# Patient Record
Sex: Female | Born: 1955 | Race: Black or African American | Hispanic: No | Smoking: Former smoker
Health system: Southern US, Community
[De-identification: ages and names within clinical notes are randomized; demographics above are authoritative.]

## PROBLEM LIST (undated history)

## (undated) ENCOUNTER — Inpatient Hospital Stay: Admission: EM | Payer: Self-pay | Source: Home / Self Care

## (undated) DIAGNOSIS — G629 Polyneuropathy, unspecified: Secondary | ICD-10-CM

## (undated) DIAGNOSIS — K922 Gastrointestinal hemorrhage, unspecified: Secondary | ICD-10-CM

## (undated) DIAGNOSIS — T7840XA Allergy, unspecified, initial encounter: Secondary | ICD-10-CM

## (undated) DIAGNOSIS — E669 Obesity, unspecified: Secondary | ICD-10-CM

## (undated) DIAGNOSIS — E119 Type 2 diabetes mellitus without complications: Secondary | ICD-10-CM

## (undated) DIAGNOSIS — I639 Cerebral infarction, unspecified: Secondary | ICD-10-CM

## (undated) DIAGNOSIS — M810 Age-related osteoporosis without current pathological fracture: Secondary | ICD-10-CM

## (undated) DIAGNOSIS — K589 Irritable bowel syndrome without diarrhea: Secondary | ICD-10-CM

## (undated) DIAGNOSIS — T8859XA Other complications of anesthesia, initial encounter: Secondary | ICD-10-CM

## (undated) DIAGNOSIS — R531 Weakness: Secondary | ICD-10-CM

## (undated) DIAGNOSIS — M1990 Unspecified osteoarthritis, unspecified site: Secondary | ICD-10-CM

## (undated) DIAGNOSIS — I739 Peripheral vascular disease, unspecified: Secondary | ICD-10-CM

## (undated) DIAGNOSIS — E538 Deficiency of other specified B group vitamins: Secondary | ICD-10-CM

## (undated) DIAGNOSIS — I70229 Atherosclerosis of native arteries of extremities with rest pain, unspecified extremity: Secondary | ICD-10-CM

## (undated) DIAGNOSIS — J349 Unspecified disorder of nose and nasal sinuses: Secondary | ICD-10-CM

## (undated) DIAGNOSIS — F419 Anxiety disorder, unspecified: Secondary | ICD-10-CM

## (undated) DIAGNOSIS — Z9289 Personal history of other medical treatment: Secondary | ICD-10-CM

## (undated) DIAGNOSIS — E785 Hyperlipidemia, unspecified: Secondary | ICD-10-CM

## (undated) DIAGNOSIS — K222 Esophageal obstruction: Secondary | ICD-10-CM

## (undated) DIAGNOSIS — T4145XA Adverse effect of unspecified anesthetic, initial encounter: Secondary | ICD-10-CM

## (undated) DIAGNOSIS — Z959 Presence of cardiac and vascular implant and graft, unspecified: Secondary | ICD-10-CM

## (undated) DIAGNOSIS — L732 Hidradenitis suppurativa: Secondary | ICD-10-CM

## (undated) DIAGNOSIS — J45909 Unspecified asthma, uncomplicated: Secondary | ICD-10-CM

## (undated) DIAGNOSIS — I998 Other disorder of circulatory system: Secondary | ICD-10-CM

## (undated) DIAGNOSIS — I219 Acute myocardial infarction, unspecified: Secondary | ICD-10-CM

## (undated) DIAGNOSIS — K219 Gastro-esophageal reflux disease without esophagitis: Secondary | ICD-10-CM

## (undated) DIAGNOSIS — D649 Anemia, unspecified: Secondary | ICD-10-CM

## (undated) DIAGNOSIS — T783XXA Angioneurotic edema, initial encounter: Secondary | ICD-10-CM

## (undated) DIAGNOSIS — Z8719 Personal history of other diseases of the digestive system: Secondary | ICD-10-CM

## (undated) DIAGNOSIS — K5731 Diverticulosis of large intestine without perforation or abscess with bleeding: Secondary | ICD-10-CM

## (undated) DIAGNOSIS — I2510 Atherosclerotic heart disease of native coronary artery without angina pectoris: Secondary | ICD-10-CM

## (undated) DIAGNOSIS — H269 Unspecified cataract: Secondary | ICD-10-CM

## (undated) DIAGNOSIS — C801 Malignant (primary) neoplasm, unspecified: Secondary | ICD-10-CM

## (undated) DIAGNOSIS — K259 Gastric ulcer, unspecified as acute or chronic, without hemorrhage or perforation: Secondary | ICD-10-CM

## (undated) DIAGNOSIS — J189 Pneumonia, unspecified organism: Secondary | ICD-10-CM

## (undated) DIAGNOSIS — J069 Acute upper respiratory infection, unspecified: Secondary | ICD-10-CM

## (undated) DIAGNOSIS — G43909 Migraine, unspecified, not intractable, without status migrainosus: Secondary | ICD-10-CM

## (undated) DIAGNOSIS — L500 Allergic urticaria: Secondary | ICD-10-CM

## (undated) DIAGNOSIS — I10 Essential (primary) hypertension: Secondary | ICD-10-CM

## (undated) HISTORY — DX: Gastrointestinal hemorrhage, unspecified: K92.2

## (undated) HISTORY — DX: Other disorder of circulatory system: I99.8

## (undated) HISTORY — DX: Unspecified cataract: H26.9

## (undated) HISTORY — DX: Age-related osteoporosis without current pathological fracture: M81.0

## (undated) HISTORY — DX: Allergy, unspecified, initial encounter: T78.40XA

## (undated) HISTORY — DX: Acute upper respiratory infection, unspecified: J06.9

## (undated) HISTORY — DX: Gastric ulcer, unspecified as acute or chronic, without hemorrhage or perforation: K25.9

## (undated) HISTORY — PX: ADENOIDECTOMY: SUR15

## (undated) HISTORY — PX: CHOLECYSTECTOMY: SHX55

## (undated) HISTORY — DX: Gastro-esophageal reflux disease without esophagitis: K21.9

## (undated) HISTORY — DX: Anxiety disorder, unspecified: F41.9

## (undated) HISTORY — PX: REDUCTION MAMMAPLASTY: SUR839

## (undated) HISTORY — PX: HIATAL HERNIA REPAIR: SHX195

## (undated) HISTORY — PX: AXILLARY HIDRADENITIS EXCISION: SUR522

## (undated) HISTORY — DX: Atherosclerotic heart disease of native coronary artery without angina pectoris: I25.10

## (undated) HISTORY — DX: Diverticulosis of large intestine without perforation or abscess with bleeding: K57.31

## (undated) HISTORY — PX: UVULOPALATOPHARYNGOPLASTY, TONSILLECTOMY AND SEPTOPLASTY: SHX2632

## (undated) HISTORY — PX: POSTERIOR LUMBAR FUSION: SHX6036

## (undated) HISTORY — PX: BACK SURGERY: SHX140

## (undated) HISTORY — DX: Cerebral infarction, unspecified: I63.9

## (undated) HISTORY — DX: Hyperlipidemia, unspecified: E78.5

## (undated) HISTORY — PX: BREAST REDUCTION SURGERY: SHX8

## (undated) HISTORY — PX: OTHER SURGICAL HISTORY: SHX169

## (undated) HISTORY — PX: TONSILLECTOMY AND ADENOIDECTOMY: SUR1326

## (undated) HISTORY — PX: HAMMER TOE SURGERY: SHX385

## (undated) HISTORY — DX: Acute myocardial infarction, unspecified: I21.9

## (undated) HISTORY — DX: Unspecified disorder of nose and nasal sinuses: J34.9

## (undated) HISTORY — DX: Esophageal obstruction: K22.2

## (undated) HISTORY — DX: Essential (primary) hypertension: I10

## (undated) HISTORY — DX: Hidradenitis suppurativa: L73.2

## (undated) HISTORY — DX: Anemia, unspecified: D64.9

## (undated) HISTORY — DX: Angioneurotic edema, initial encounter: T78.3XXA

## (undated) HISTORY — DX: Atherosclerosis of native arteries of extremities with rest pain, unspecified extremity: I70.229

## (undated) HISTORY — DX: Irritable bowel syndrome, unspecified: K58.9

## (undated) HISTORY — DX: Allergic urticaria: L50.0

---
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
## 1983-05-07 DIAGNOSIS — C801 Malignant (primary) neoplasm, unspecified: Secondary | ICD-10-CM

## 1983-05-07 HISTORY — PX: APPENDECTOMY: SHX54

## 1983-05-07 HISTORY — PX: ABDOMINAL HYSTERECTOMY: SHX81

## 1983-05-07 HISTORY — DX: Malignant (primary) neoplasm, unspecified: C80.1

## 1994-05-06 HISTORY — PX: NISSEN FUNDOPLICATION: SHX2091

## 1997-11-10 ENCOUNTER — Emergency Department (HOSPITAL_COMMUNITY): Admission: EM | Admit: 1997-11-10 | Discharge: 1997-11-10 | Payer: Self-pay | Admitting: Internal Medicine

## 1998-03-01 ENCOUNTER — Emergency Department (HOSPITAL_COMMUNITY): Admission: EM | Admit: 1998-03-01 | Discharge: 1998-03-01 | Payer: Self-pay | Admitting: Emergency Medicine

## 1998-03-02 ENCOUNTER — Encounter: Payer: Self-pay | Admitting: Emergency Medicine

## 1998-03-13 ENCOUNTER — Emergency Department (HOSPITAL_COMMUNITY): Admission: EM | Admit: 1998-03-13 | Discharge: 1998-03-13 | Payer: Self-pay | Admitting: Emergency Medicine

## 1998-06-14 ENCOUNTER — Encounter (HOSPITAL_BASED_OUTPATIENT_CLINIC_OR_DEPARTMENT_OTHER): Payer: Self-pay | Admitting: General Surgery

## 1998-06-16 ENCOUNTER — Ambulatory Visit (HOSPITAL_COMMUNITY): Admission: RE | Admit: 1998-06-16 | Discharge: 1998-06-16 | Payer: Self-pay | Admitting: General Surgery

## 1999-06-05 ENCOUNTER — Encounter: Payer: Self-pay | Admitting: *Deleted

## 1999-06-05 ENCOUNTER — Ambulatory Visit (HOSPITAL_COMMUNITY): Admission: RE | Admit: 1999-06-05 | Discharge: 1999-06-05 | Payer: Self-pay | Admitting: *Deleted

## 1999-12-31 ENCOUNTER — Encounter: Admission: RE | Admit: 1999-12-31 | Discharge: 2000-03-30 | Payer: Self-pay | Admitting: Cardiology

## 2000-05-04 ENCOUNTER — Emergency Department (HOSPITAL_COMMUNITY): Admission: EM | Admit: 2000-05-04 | Discharge: 2000-05-05 | Payer: Self-pay | Admitting: Emergency Medicine

## 2000-05-04 ENCOUNTER — Encounter: Payer: Self-pay | Admitting: Emergency Medicine

## 2000-05-06 HISTORY — PX: ANTERIOR CERVICAL DECOMP/DISCECTOMY FUSION: SHX1161

## 2000-05-21 ENCOUNTER — Encounter: Payer: Self-pay | Admitting: Cardiology

## 2000-05-21 ENCOUNTER — Ambulatory Visit (HOSPITAL_COMMUNITY): Admission: RE | Admit: 2000-05-21 | Discharge: 2000-05-21 | Payer: Self-pay | Admitting: Cardiology

## 2000-05-29 ENCOUNTER — Encounter: Payer: Self-pay | Admitting: Cardiology

## 2000-05-29 ENCOUNTER — Encounter: Admission: RE | Admit: 2000-05-29 | Discharge: 2000-05-29 | Payer: Self-pay | Admitting: Cardiology

## 2000-06-20 ENCOUNTER — Encounter: Payer: Self-pay | Admitting: Neurosurgery

## 2000-06-20 ENCOUNTER — Ambulatory Visit (HOSPITAL_COMMUNITY): Admission: RE | Admit: 2000-06-20 | Discharge: 2000-06-20 | Payer: Self-pay | Admitting: Neurosurgery

## 2000-07-09 ENCOUNTER — Encounter: Payer: Self-pay | Admitting: Neurosurgery

## 2000-07-10 ENCOUNTER — Inpatient Hospital Stay (HOSPITAL_COMMUNITY): Admission: RE | Admit: 2000-07-10 | Discharge: 2000-07-11 | Payer: Self-pay | Admitting: Neurosurgery

## 2000-07-13 ENCOUNTER — Emergency Department (HOSPITAL_COMMUNITY): Admission: EM | Admit: 2000-07-13 | Discharge: 2000-07-13 | Payer: Self-pay | Admitting: Emergency Medicine

## 2000-07-13 ENCOUNTER — Encounter: Payer: Self-pay | Admitting: Emergency Medicine

## 2000-07-31 ENCOUNTER — Encounter: Admission: RE | Admit: 2000-07-31 | Discharge: 2000-07-31 | Payer: Self-pay | Admitting: Neurosurgery

## 2000-07-31 ENCOUNTER — Encounter: Payer: Self-pay | Admitting: Neurosurgery

## 2000-10-14 ENCOUNTER — Encounter: Admission: RE | Admit: 2000-10-14 | Discharge: 2000-12-03 | Payer: Self-pay | Admitting: Neurosurgery

## 2000-10-21 ENCOUNTER — Ambulatory Visit (HOSPITAL_BASED_OUTPATIENT_CLINIC_OR_DEPARTMENT_OTHER): Admission: RE | Admit: 2000-10-21 | Discharge: 2000-10-21 | Payer: Self-pay | Admitting: Otolaryngology

## 2000-12-04 ENCOUNTER — Encounter: Admission: RE | Admit: 2000-12-04 | Discharge: 2001-03-04 | Payer: Self-pay | Admitting: Neurosurgery

## 2001-04-02 ENCOUNTER — Inpatient Hospital Stay (HOSPITAL_COMMUNITY): Admission: EM | Admit: 2001-04-02 | Discharge: 2001-04-04 | Payer: Self-pay | Admitting: Emergency Medicine

## 2001-04-02 ENCOUNTER — Encounter: Payer: Self-pay | Admitting: Emergency Medicine

## 2001-04-03 ENCOUNTER — Encounter: Payer: Self-pay | Admitting: Cardiology

## 2001-06-01 ENCOUNTER — Encounter: Payer: Self-pay | Admitting: Neurology

## 2001-06-01 ENCOUNTER — Ambulatory Visit (HOSPITAL_COMMUNITY): Admission: RE | Admit: 2001-06-01 | Discharge: 2001-06-01 | Payer: Self-pay | Admitting: *Deleted

## 2001-09-10 ENCOUNTER — Ambulatory Visit (HOSPITAL_COMMUNITY): Admission: RE | Admit: 2001-09-10 | Discharge: 2001-09-10 | Payer: Self-pay | Admitting: General Surgery

## 2001-10-20 ENCOUNTER — Encounter: Admission: RE | Admit: 2001-10-20 | Discharge: 2001-10-20 | Payer: Self-pay | Admitting: Cardiology

## 2001-10-20 ENCOUNTER — Encounter: Payer: Self-pay | Admitting: Cardiology

## 2001-11-28 ENCOUNTER — Encounter: Payer: Self-pay | Admitting: Neurosurgery

## 2001-11-28 ENCOUNTER — Ambulatory Visit (HOSPITAL_COMMUNITY): Admission: RE | Admit: 2001-11-28 | Discharge: 2001-11-28 | Payer: Self-pay | Admitting: Neurosurgery

## 2001-12-12 ENCOUNTER — Inpatient Hospital Stay (HOSPITAL_COMMUNITY): Admission: EM | Admit: 2001-12-12 | Discharge: 2001-12-15 | Payer: Self-pay | Admitting: *Deleted

## 2001-12-12 ENCOUNTER — Encounter: Payer: Self-pay | Admitting: Cardiology

## 2001-12-14 ENCOUNTER — Encounter: Payer: Self-pay | Admitting: Cardiology

## 2002-05-06 HISTORY — PX: CARDIAC CATHETERIZATION: SHX172

## 2002-06-04 ENCOUNTER — Encounter: Admission: RE | Admit: 2002-06-04 | Discharge: 2002-06-04 | Payer: Self-pay | Admitting: Gastroenterology

## 2002-06-04 ENCOUNTER — Encounter: Payer: Self-pay | Admitting: Gastroenterology

## 2002-08-10 ENCOUNTER — Ambulatory Visit (HOSPITAL_COMMUNITY): Admission: RE | Admit: 2002-08-10 | Discharge: 2002-08-10 | Payer: Self-pay | Admitting: Cardiology

## 2002-12-22 ENCOUNTER — Encounter: Payer: Self-pay | Admitting: Obstetrics & Gynecology

## 2002-12-22 ENCOUNTER — Ambulatory Visit (HOSPITAL_COMMUNITY): Admission: RE | Admit: 2002-12-22 | Discharge: 2002-12-22 | Payer: Self-pay | Admitting: Obstetrics & Gynecology

## 2002-12-27 ENCOUNTER — Ambulatory Visit (HOSPITAL_COMMUNITY): Admission: RE | Admit: 2002-12-27 | Discharge: 2002-12-27 | Payer: Self-pay | Admitting: Obstetrics & Gynecology

## 2003-03-28 ENCOUNTER — Encounter: Admission: RE | Admit: 2003-03-28 | Discharge: 2003-03-28 | Payer: Self-pay | Admitting: Obstetrics & Gynecology

## 2003-10-06 ENCOUNTER — Encounter: Admission: RE | Admit: 2003-10-06 | Discharge: 2003-10-06 | Payer: Self-pay | Admitting: Gastroenterology

## 2003-10-18 ENCOUNTER — Emergency Department (HOSPITAL_COMMUNITY): Admission: EM | Admit: 2003-10-18 | Discharge: 2003-10-19 | Payer: Self-pay | Admitting: *Deleted

## 2004-05-24 ENCOUNTER — Inpatient Hospital Stay (HOSPITAL_COMMUNITY): Admission: EM | Admit: 2004-05-24 | Discharge: 2004-05-26 | Payer: Self-pay | Admitting: Emergency Medicine

## 2004-08-13 ENCOUNTER — Ambulatory Visit (HOSPITAL_COMMUNITY): Admission: RE | Admit: 2004-08-13 | Discharge: 2004-08-13 | Payer: Self-pay | Admitting: Cardiology

## 2004-10-12 ENCOUNTER — Ambulatory Visit: Payer: Self-pay | Admitting: Family Medicine

## 2004-10-15 ENCOUNTER — Ambulatory Visit: Payer: Self-pay | Admitting: *Deleted

## 2004-11-02 ENCOUNTER — Ambulatory Visit: Payer: Self-pay | Admitting: Family Medicine

## 2004-11-19 ENCOUNTER — Ambulatory Visit: Payer: Self-pay | Admitting: Family Medicine

## 2005-01-18 ENCOUNTER — Ambulatory Visit: Payer: Self-pay | Admitting: Family Medicine

## 2005-02-05 ENCOUNTER — Ambulatory Visit: Payer: Self-pay | Admitting: *Deleted

## 2005-02-27 ENCOUNTER — Ambulatory Visit: Payer: Self-pay | Admitting: Family Medicine

## 2005-03-08 ENCOUNTER — Ambulatory Visit: Payer: Self-pay | Admitting: Family Medicine

## 2005-05-06 HISTORY — PX: BREAST BIOPSY: SHX20

## 2005-05-07 ENCOUNTER — Encounter: Admission: RE | Admit: 2005-05-07 | Discharge: 2005-05-07 | Payer: Self-pay | Admitting: Cardiology

## 2005-05-21 ENCOUNTER — Inpatient Hospital Stay (HOSPITAL_COMMUNITY): Admission: AD | Admit: 2005-05-21 | Discharge: 2005-05-23 | Payer: Self-pay | Admitting: Cardiology

## 2005-06-04 ENCOUNTER — Encounter: Payer: Self-pay | Admitting: Internal Medicine

## 2005-06-04 HISTORY — PX: ESOPHAGOGASTRODUODENOSCOPY: SHX1529

## 2005-07-14 ENCOUNTER — Emergency Department (HOSPITAL_COMMUNITY): Admission: EM | Admit: 2005-07-14 | Discharge: 2005-07-15 | Payer: Self-pay | Admitting: Emergency Medicine

## 2005-09-17 ENCOUNTER — Encounter: Admission: RE | Admit: 2005-09-17 | Discharge: 2005-09-17 | Payer: Self-pay | Admitting: Cardiology

## 2005-09-20 ENCOUNTER — Emergency Department (HOSPITAL_COMMUNITY): Admission: EM | Admit: 2005-09-20 | Discharge: 2005-09-20 | Payer: Self-pay | Admitting: Emergency Medicine

## 2005-10-04 ENCOUNTER — Ambulatory Visit (HOSPITAL_COMMUNITY): Admission: RE | Admit: 2005-10-04 | Discharge: 2005-10-04 | Payer: Self-pay | Admitting: Cardiovascular Disease

## 2006-02-04 ENCOUNTER — Encounter: Admission: RE | Admit: 2006-02-04 | Discharge: 2006-02-04 | Payer: Self-pay | Admitting: Cardiology

## 2006-03-13 ENCOUNTER — Ambulatory Visit (HOSPITAL_BASED_OUTPATIENT_CLINIC_OR_DEPARTMENT_OTHER): Admission: RE | Admit: 2006-03-13 | Discharge: 2006-03-13 | Payer: Self-pay | Admitting: General Surgery

## 2006-03-13 ENCOUNTER — Encounter (INDEPENDENT_AMBULATORY_CARE_PROVIDER_SITE_OTHER): Payer: Self-pay | Admitting: Specialist

## 2006-05-06 HISTORY — PX: BREAST CYST EXCISION: SHX579

## 2006-05-30 ENCOUNTER — Ambulatory Visit (HOSPITAL_COMMUNITY): Admission: RE | Admit: 2006-05-30 | Discharge: 2006-05-30 | Payer: Self-pay | Admitting: Cardiology

## 2006-06-03 ENCOUNTER — Ambulatory Visit: Payer: Self-pay | Admitting: Internal Medicine

## 2006-06-03 LAB — CONVERTED CEMR LAB
Basophils Absolute: 0 10*3/uL (ref 0.0–0.1)
Basophils Relative: 0.7 % (ref 0.0–1.0)
Eosinophils Absolute: 0 10*3/uL (ref 0.0–0.6)
Eosinophils Relative: 0.7 % (ref 0.0–5.0)
HCT: 41.3 % (ref 36.0–46.0)
Hemoglobin: 14.5 g/dL (ref 12.0–15.0)
Lymphocytes Relative: 37.2 % (ref 12.0–46.0)
MCHC: 35 g/dL (ref 30.0–36.0)
MCV: 81.1 fL (ref 78.0–100.0)
Monocytes Absolute: 0.4 10*3/uL (ref 0.2–0.7)
Monocytes Relative: 8.5 % (ref 3.0–11.0)
Neutro Abs: 2.9 10*3/uL (ref 1.4–7.7)
Neutrophils Relative %: 52.9 % (ref 43.0–77.0)
Platelets: 233 10*3/uL (ref 150–400)
RBC: 5.09 M/uL (ref 3.87–5.11)
RDW: 12.2 % (ref 11.5–14.6)
Sed Rate: 14 mm/hr (ref 0–25)
WBC: 5.2 10*3/uL (ref 4.5–10.5)

## 2006-06-04 ENCOUNTER — Encounter: Payer: Self-pay | Admitting: Internal Medicine

## 2006-06-18 ENCOUNTER — Ambulatory Visit: Payer: Self-pay | Admitting: Internal Medicine

## 2006-06-19 ENCOUNTER — Ambulatory Visit: Payer: Self-pay | Admitting: Internal Medicine

## 2006-06-19 ENCOUNTER — Encounter (INDEPENDENT_AMBULATORY_CARE_PROVIDER_SITE_OTHER): Payer: Self-pay | Admitting: *Deleted

## 2006-06-19 HISTORY — PX: COLONOSCOPY: SHX174

## 2006-07-16 ENCOUNTER — Ambulatory Visit: Payer: Self-pay | Admitting: Internal Medicine

## 2006-11-17 ENCOUNTER — Ambulatory Visit: Payer: Self-pay | Admitting: Internal Medicine

## 2006-11-17 LAB — CONVERTED CEMR LAB: TSH: 1.07 microintl units/mL (ref 0.35–5.50)

## 2006-11-19 ENCOUNTER — Ambulatory Visit: Payer: Self-pay | Admitting: Internal Medicine

## 2006-12-08 ENCOUNTER — Inpatient Hospital Stay (HOSPITAL_COMMUNITY): Admission: RE | Admit: 2006-12-08 | Discharge: 2006-12-10 | Payer: Self-pay | Admitting: Neurosurgery

## 2007-03-09 ENCOUNTER — Ambulatory Visit (HOSPITAL_COMMUNITY): Admission: RE | Admit: 2007-03-09 | Discharge: 2007-03-09 | Payer: Self-pay | Admitting: Neurosurgery

## 2007-03-27 ENCOUNTER — Inpatient Hospital Stay (HOSPITAL_COMMUNITY): Admission: RE | Admit: 2007-03-27 | Discharge: 2007-03-30 | Payer: Self-pay | Admitting: Neurosurgery

## 2007-07-09 ENCOUNTER — Inpatient Hospital Stay (HOSPITAL_COMMUNITY): Admission: EM | Admit: 2007-07-09 | Discharge: 2007-07-15 | Payer: Self-pay | Admitting: Emergency Medicine

## 2007-07-10 ENCOUNTER — Encounter (INDEPENDENT_AMBULATORY_CARE_PROVIDER_SITE_OTHER): Payer: Self-pay | Admitting: Cardiology

## 2007-08-24 DIAGNOSIS — E119 Type 2 diabetes mellitus without complications: Secondary | ICD-10-CM | POA: Insufficient documentation

## 2007-08-24 DIAGNOSIS — E785 Hyperlipidemia, unspecified: Secondary | ICD-10-CM | POA: Insufficient documentation

## 2007-08-24 DIAGNOSIS — I10 Essential (primary) hypertension: Secondary | ICD-10-CM | POA: Insufficient documentation

## 2007-09-03 ENCOUNTER — Ambulatory Visit (HOSPITAL_COMMUNITY): Admission: RE | Admit: 2007-09-03 | Discharge: 2007-09-03 | Payer: Self-pay | Admitting: Obstetrics

## 2007-09-22 ENCOUNTER — Encounter: Admission: RE | Admit: 2007-09-22 | Discharge: 2007-09-22 | Payer: Self-pay | Admitting: Neurosurgery

## 2008-07-11 ENCOUNTER — Inpatient Hospital Stay (HOSPITAL_COMMUNITY): Admission: EM | Admit: 2008-07-11 | Discharge: 2008-07-13 | Payer: Self-pay | Admitting: Emergency Medicine

## 2008-07-11 ENCOUNTER — Encounter (INDEPENDENT_AMBULATORY_CARE_PROVIDER_SITE_OTHER): Payer: Self-pay | Admitting: Cardiology

## 2008-07-11 ENCOUNTER — Ambulatory Visit: Payer: Self-pay | Admitting: *Deleted

## 2009-02-15 ENCOUNTER — Emergency Department (HOSPITAL_COMMUNITY): Admission: EM | Admit: 2009-02-15 | Discharge: 2009-02-15 | Payer: Self-pay | Admitting: Emergency Medicine

## 2009-02-15 ENCOUNTER — Ambulatory Visit (HOSPITAL_COMMUNITY): Admission: RE | Admit: 2009-02-15 | Discharge: 2009-02-15 | Payer: Self-pay | Admitting: Family Medicine

## 2009-03-01 ENCOUNTER — Encounter: Admission: RE | Admit: 2009-03-01 | Discharge: 2009-03-01 | Payer: Self-pay | Admitting: Neurosurgery

## 2009-03-22 ENCOUNTER — Encounter: Admission: RE | Admit: 2009-03-22 | Discharge: 2009-03-22 | Payer: Self-pay | Admitting: Neurosurgery

## 2009-04-17 ENCOUNTER — Ambulatory Visit: Admission: RE | Admit: 2009-04-17 | Discharge: 2009-04-17 | Payer: Self-pay | Admitting: Cardiology

## 2009-04-20 ENCOUNTER — Ambulatory Visit (HOSPITAL_COMMUNITY): Admission: RE | Admit: 2009-04-20 | Discharge: 2009-04-20 | Payer: Self-pay | Admitting: Cardiology

## 2009-06-23 ENCOUNTER — Encounter (HOSPITAL_COMMUNITY): Admission: RE | Admit: 2009-06-23 | Discharge: 2009-09-05 | Payer: Self-pay | Admitting: Cardiology

## 2009-08-29 ENCOUNTER — Emergency Department (HOSPITAL_COMMUNITY): Admission: EM | Admit: 2009-08-29 | Discharge: 2009-08-29 | Payer: Self-pay | Admitting: Emergency Medicine

## 2009-10-02 ENCOUNTER — Emergency Department (HOSPITAL_COMMUNITY): Admission: EM | Admit: 2009-10-02 | Discharge: 2009-10-02 | Payer: Self-pay | Admitting: Emergency Medicine

## 2009-10-04 ENCOUNTER — Emergency Department (HOSPITAL_COMMUNITY): Admission: EM | Admit: 2009-10-04 | Discharge: 2009-10-04 | Payer: Self-pay | Admitting: Emergency Medicine

## 2009-11-02 ENCOUNTER — Ambulatory Visit (HOSPITAL_COMMUNITY): Admission: RE | Admit: 2009-11-02 | Discharge: 2009-11-02 | Payer: Self-pay | Admitting: Cardiology

## 2009-11-17 ENCOUNTER — Ambulatory Visit (HOSPITAL_COMMUNITY): Admission: RE | Admit: 2009-11-17 | Discharge: 2009-11-17 | Payer: Self-pay | Admitting: Cardiology

## 2009-11-20 ENCOUNTER — Ambulatory Visit (HOSPITAL_COMMUNITY): Admission: RE | Admit: 2009-11-20 | Discharge: 2009-11-20 | Payer: Self-pay | Admitting: Cardiology

## 2009-12-06 ENCOUNTER — Inpatient Hospital Stay (HOSPITAL_COMMUNITY): Admission: EM | Admit: 2009-12-06 | Discharge: 2009-12-09 | Payer: Self-pay | Admitting: Emergency Medicine

## 2009-12-06 ENCOUNTER — Ambulatory Visit: Payer: Self-pay | Admitting: Internal Medicine

## 2010-02-12 ENCOUNTER — Ambulatory Visit (HOSPITAL_COMMUNITY): Admission: RE | Admit: 2010-02-12 | Discharge: 2010-02-12 | Payer: Self-pay | Admitting: Cardiology

## 2010-05-15 ENCOUNTER — Emergency Department (HOSPITAL_COMMUNITY)
Admission: EM | Admit: 2010-05-15 | Discharge: 2010-05-15 | Payer: Self-pay | Source: Home / Self Care | Admitting: Emergency Medicine

## 2010-05-27 ENCOUNTER — Encounter: Payer: Self-pay | Admitting: Gastroenterology

## 2010-05-27 ENCOUNTER — Encounter: Payer: Self-pay | Admitting: Cardiology

## 2010-06-07 NOTE — Procedures (Signed)
Summary: Gastroenterology EGD  Gastroenterology EGD   Imported By: Darcey Nora RN 08/25/2007 10:17:32  _____________________________________________________________________  External Attachment:    Type:   Image     Comment:   External Document

## 2010-07-20 LAB — GLUCOSE, CAPILLARY
Glucose-Capillary: 106 mg/dL — ABNORMAL HIGH (ref 70–99)
Glucose-Capillary: 110 mg/dL — ABNORMAL HIGH (ref 70–99)
Glucose-Capillary: 110 mg/dL — ABNORMAL HIGH (ref 70–99)
Glucose-Capillary: 122 mg/dL — ABNORMAL HIGH (ref 70–99)
Glucose-Capillary: 127 mg/dL — ABNORMAL HIGH (ref 70–99)
Glucose-Capillary: 135 mg/dL — ABNORMAL HIGH (ref 70–99)
Glucose-Capillary: 136 mg/dL — ABNORMAL HIGH (ref 70–99)
Glucose-Capillary: 142 mg/dL — ABNORMAL HIGH (ref 70–99)
Glucose-Capillary: 144 mg/dL — ABNORMAL HIGH (ref 70–99)
Glucose-Capillary: 149 mg/dL — ABNORMAL HIGH (ref 70–99)
Glucose-Capillary: 156 mg/dL — ABNORMAL HIGH (ref 70–99)
Glucose-Capillary: 157 mg/dL — ABNORMAL HIGH (ref 70–99)
Glucose-Capillary: 184 mg/dL — ABNORMAL HIGH (ref 70–99)
Glucose-Capillary: 77 mg/dL (ref 70–99)
Glucose-Capillary: 98 mg/dL (ref 70–99)

## 2010-07-20 LAB — URINALYSIS, ROUTINE W REFLEX MICROSCOPIC
Bilirubin Urine: NEGATIVE
Glucose, UA: NEGATIVE mg/dL
Hgb urine dipstick: NEGATIVE
Ketones, ur: NEGATIVE mg/dL
Nitrite: NEGATIVE
Protein, ur: NEGATIVE mg/dL
Specific Gravity, Urine: 1.008 (ref 1.005–1.030)
Urobilinogen, UA: 1 mg/dL (ref 0.0–1.0)
pH: 6.5 (ref 5.0–8.0)

## 2010-07-20 LAB — CBC
HCT: 41 % (ref 36.0–46.0)
Hemoglobin: 13.8 g/dL (ref 12.0–15.0)
MCH: 28.5 pg (ref 26.0–34.0)
MCHC: 33.7 g/dL (ref 30.0–36.0)
MCV: 84.5 fL (ref 78.0–100.0)
Platelets: 183 10*3/uL (ref 150–400)
RBC: 4.85 MIL/uL (ref 3.87–5.11)
RDW: 13.5 % (ref 11.5–15.5)
WBC: 6.3 10*3/uL (ref 4.0–10.5)

## 2010-07-20 LAB — COMPREHENSIVE METABOLIC PANEL
ALT: 18 U/L (ref 0–35)
AST: 21 U/L (ref 0–37)
Albumin: 3.9 g/dL (ref 3.5–5.2)
Alkaline Phosphatase: 67 U/L (ref 39–117)
BUN: 4 mg/dL — ABNORMAL LOW (ref 6–23)
CO2: 26 mEq/L (ref 19–32)
Calcium: 8.9 mg/dL (ref 8.4–10.5)
Chloride: 106 mEq/L (ref 96–112)
Creatinine, Ser: 0.67 mg/dL (ref 0.4–1.2)
GFR calc Af Amer: >60 mL/min (ref >60)
GFR calc non Af Amer: >60 mL/min (ref >60)
Glucose, Bld: 114 mg/dL — ABNORMAL HIGH (ref 70–99)
Potassium: 3.7 mEq/L (ref 3.5–5.1)
Sodium: 139 mEq/L (ref 135–145)
Total Bilirubin: 1.1 mg/dL (ref 0.3–1.2)
Total Protein: 6.9 g/dL (ref 6.0–8.3)

## 2010-07-20 LAB — DIFFERENTIAL
Basophils Absolute: 0 10*3/uL (ref 0.0–0.1)
Basophils Relative: 0 % (ref 0–1)
Eosinophils Absolute: 0 10*3/uL (ref 0.0–0.7)
Eosinophils Relative: 1 % (ref 0–5)
Lymphocytes Relative: 41 % (ref 12–46)
Lymphs Abs: 2.6 10*3/uL (ref 0.7–4.0)
Monocytes Absolute: 0.3 10*3/uL (ref 0.1–1.0)
Monocytes Relative: 5 % (ref 3–12)
Neutro Abs: 3.4 10*3/uL (ref 1.7–7.7)
Neutrophils Relative %: 53 % (ref 43–77)

## 2010-07-20 LAB — BASIC METABOLIC PANEL
BUN: 4 mg/dL — ABNORMAL LOW (ref 6–23)
CO2: 27 mEq/L (ref 19–32)
Calcium: 9 mg/dL (ref 8.4–10.5)
Chloride: 107 mEq/L (ref 96–112)
Creatinine, Ser: 0.73 mg/dL (ref 0.4–1.2)
GFR calc Af Amer: >60 mL/min (ref >60)
GFR calc non Af Amer: >60 mL/min (ref >60)
Glucose, Bld: 153 mg/dL — ABNORMAL HIGH (ref 70–99)
Potassium: 4.3 mEq/L (ref 3.5–5.1)
Sodium: 139 mEq/L (ref 135–145)

## 2010-07-20 LAB — POCT I-STAT, CHEM 8
BUN: 4 mg/dL — ABNORMAL LOW (ref 6–23)
Calcium, Ion: 1.22 mmol/L (ref 1.12–1.32)
Chloride: 106 mEq/L (ref 96–112)
Creatinine, Ser: 0.6 mg/dL (ref 0.4–1.2)
Glucose, Bld: 112 mg/dL — ABNORMAL HIGH (ref 70–99)
HCT: 43 % (ref 36.0–46.0)
Hemoglobin: 14.6 g/dL (ref 12.0–15.0)
Potassium: 3.8 mEq/L (ref 3.5–5.1)
Sodium: 143 mEq/L (ref 135–145)
TCO2: 28 mmol/L (ref 0–100)

## 2010-07-20 LAB — HEPATIC FUNCTION PANEL
ALT: 15 U/L (ref 0–35)
AST: 22 U/L (ref 0–37)
Albumin: 4 g/dL (ref 3.5–5.2)
Alkaline Phosphatase: 63 U/L (ref 39–117)
Bilirubin, Direct: 0.1 mg/dL (ref 0.0–0.3)
Indirect Bilirubin: 0.9 mg/dL (ref 0.3–0.9)
Total Bilirubin: 1 mg/dL (ref 0.3–1.2)
Total Protein: 7.1 g/dL (ref 6.0–8.3)

## 2010-07-20 LAB — CANCER ANTIGEN 19-9: CA 19-9: 20.7 U/mL — ABNORMAL LOW (ref <35.0)

## 2010-07-20 LAB — AMYLASE
Amylase: 39 U/L (ref 0–105)
Amylase: 41 U/L (ref 0–105)

## 2010-07-20 LAB — HEMOGLOBIN A1C
Hgb A1c MFr Bld: 7.1 % — ABNORMAL HIGH (ref <5.7)
Mean Plasma Glucose: 157 mg/dL — ABNORMAL HIGH (ref <117)

## 2010-07-20 LAB — POCT CARDIAC MARKERS
CKMB, poc: <1 ng/mL — ABNORMAL LOW (ref 1.0–8.0)
Myoglobin, poc: 58 ng/mL (ref 12–200)
Troponin i, poc: <0.05 ng/mL (ref 0.00–0.09)

## 2010-07-20 LAB — LIPASE, BLOOD
Lipase: 27 U/L (ref 11–59)
Lipase: 29 U/L (ref 11–59)

## 2010-07-20 LAB — MICROALBUMIN, URINE: Microalb, Ur: 1.86 mg/dL (ref 0.00–1.89)

## 2010-07-23 LAB — GLUCOSE, CAPILLARY: Glucose-Capillary: 93 mg/dL (ref 70–99)

## 2010-08-16 LAB — CBC
HCT: 36.8 % (ref 36.0–46.0)
Hemoglobin: 12.6 g/dL (ref 12.0–15.0)
MCHC: 34.1 g/dL (ref 30.0–36.0)
MCV: 84.7 fL (ref 78.0–100.0)
Platelets: 188 10*3/uL (ref 150–400)
RBC: 4.35 MIL/uL (ref 3.87–5.11)
RDW: 13.1 % (ref 11.5–15.5)
WBC: 5.7 10*3/uL (ref 4.0–10.5)

## 2010-08-16 LAB — DIFFERENTIAL
Basophils Absolute: 0 10*3/uL (ref 0.0–0.1)
Basophils Relative: 0 % (ref 0–1)
Eosinophils Absolute: 0.1 10*3/uL (ref 0.0–0.7)
Eosinophils Relative: 1 % (ref 0–5)
Lymphocytes Relative: 50 % — ABNORMAL HIGH (ref 12–46)
Lymphs Abs: 2.9 10*3/uL (ref 0.7–4.0)
Monocytes Absolute: 0.4 10*3/uL (ref 0.1–1.0)
Monocytes Relative: 7 % (ref 3–12)
Neutro Abs: 2.4 10*3/uL (ref 1.7–7.7)
Neutrophils Relative %: 42 % — ABNORMAL LOW (ref 43–77)

## 2010-08-16 LAB — CARDIAC PANEL(CRET KIN+CKTOT+MB+TROPI)
CK, MB: 0.4 ng/mL (ref 0.3–4.0)
CK, MB: 0.5 ng/mL (ref 0.3–4.0)
CK, MB: 0.7 ng/mL (ref 0.3–4.0)
CK, MB: 0.7 ng/mL (ref 0.3–4.0)
CK, MB: 0.9 ng/mL (ref 0.3–4.0)
Relative Index: 0.7 (ref 0.0–2.5)
Relative Index: INVALID (ref 0.0–2.5)
Relative Index: INVALID (ref 0.0–2.5)
Relative Index: INVALID (ref 0.0–2.5)
Relative Index: INVALID (ref 0.0–2.5)
Total CK: 102 U/L (ref 7–177)
Total CK: 78 U/L (ref 7–177)
Total CK: 78 U/L (ref 7–177)
Total CK: 88 U/L (ref 7–177)
Total CK: 94 U/L (ref 7–177)
Troponin I: <0.01 ng/mL (ref 0.00–0.06)
Troponin I: <0.01 ng/mL (ref 0.00–0.06)
Troponin I: <0.01 ng/mL (ref 0.00–0.06)
Troponin I: <0.01 ng/mL (ref 0.00–0.06)
Troponin I: <0.01 ng/mL (ref 0.00–0.06)

## 2010-08-16 LAB — POCT CARDIAC MARKERS
CKMB, poc: <1 ng/mL — ABNORMAL LOW (ref 1.0–8.0)
Myoglobin, poc: 52.7 ng/mL (ref 12–200)
Troponin i, poc: <0.05 ng/mL (ref 0.00–0.09)

## 2010-08-16 LAB — GLUCOSE, CAPILLARY
Glucose-Capillary: 107 mg/dL — ABNORMAL HIGH (ref 70–99)
Glucose-Capillary: 107 mg/dL — ABNORMAL HIGH (ref 70–99)
Glucose-Capillary: 108 mg/dL — ABNORMAL HIGH (ref 70–99)
Glucose-Capillary: 111 mg/dL — ABNORMAL HIGH (ref 70–99)
Glucose-Capillary: 117 mg/dL — ABNORMAL HIGH (ref 70–99)
Glucose-Capillary: 150 mg/dL — ABNORMAL HIGH (ref 70–99)
Glucose-Capillary: 89 mg/dL (ref 70–99)
Glucose-Capillary: 95 mg/dL (ref 70–99)

## 2010-08-16 LAB — COMPREHENSIVE METABOLIC PANEL
ALT: 13 U/L (ref 0–35)
AST: 20 U/L (ref 0–37)
Albumin: 3.6 g/dL (ref 3.5–5.2)
Alkaline Phosphatase: 50 U/L (ref 39–117)
BUN: 9 mg/dL (ref 6–23)
CO2: 28 mEq/L (ref 19–32)
Calcium: 9.1 mg/dL (ref 8.4–10.5)
Chloride: 104 mEq/L (ref 96–112)
Creatinine, Ser: 0.65 mg/dL (ref 0.4–1.2)
GFR calc Af Amer: >60 mL/min (ref >60)
GFR calc non Af Amer: >60 mL/min (ref >60)
Glucose, Bld: 99 mg/dL (ref 70–99)
Potassium: 3.3 mEq/L — ABNORMAL LOW (ref 3.5–5.1)
Sodium: 141 mEq/L (ref 135–145)
Total Bilirubin: 0.5 mg/dL (ref 0.3–1.2)
Total Protein: 6.3 g/dL (ref 6.0–8.3)

## 2010-09-18 NOTE — Discharge Summary (Signed)
NAMEVANILLA, HEATHERINGTON NO.:  [id number]   MEDICAL RECORD NO.:  [id number]          PATIENT TYPE:  INP   LOCATION:  3002                         FACILITY:  [facility]   PHYSICIAN:  Osvaldo Shipper. Spruill, M.D.DATE OF BIRTH:  [birth year]   DATE OF ADMISSION:  07/09/2007  DATE OF DISCHARGE:  07/15/2007                               DISCHARGE SUMMARY   DISCHARGE DIAGNOSES:  1. Cerebral artery occlusion with cerebral infarction.  2. Diplopia.  3. Hypertension.  4. Hyperlipidemia.  5. Alcohol abuse in remission  6. Coronary atherosclerosis of the native coronary vessels.   [name] is a 55 year old patient who developed problems with blurred  vision, dizziness and weakness that started approximately 2 days ago.  The symptoms seemed to have started after the patient went to the [facility]  [facility] her brother.  The patient also complained of facial numbness and then  came to the emergency department.  She was seen by the emergency  department team and was found to have a possible stroke.  It is of note  that this patient has type 2 diabetes, hypertension, and dyslipidemia.   The initial history and physical as well as the hospital evaluations  were completed by Dr. Donia Guiles.  The patient was found to be in  moderate distress with a disconjugant gaze.  She could not go beyond the  midline and could not look left.  She had a normal S1-S2 with no S3 or  rubs.  There was no edema on the extremities examination.  The patient  was subsequently admitted for stroke symptoms and for continued  evaluation of hypertension and diabetes mellitus.   The patient was seen by the neuro team.  After evaluation of the patient  and review of the CT scans, it was their opinion that the patient had  onset of gaze deviation to the right double vision and sensory symptoms  with NIH stroke score of 8, hypertension and diabetes as well as the  patient with multiple risk factors.  It was their opinion  that this was  probably a pontine infarction and there was also question of multifocal  infarcts as the clinical examination did not seem to localize a stroke  at any one single area.  It was also their opinion that the patient was  not a TPA candidate due to the duration of the symptoms.  Plans were  also made for an MRI of the brain.   The patient was seen by the stroke team and a stroke swallow screen was  done.  The patient passed.   A two-dimensional echocardiogram was also done as well as bilateral  carotid artery duplex studies.  There was no evidence of significant ICA  stenosis noted.   The MRI was completed and there was no acute stroke noted.   The patient was issued an eye patch to only use when up and alert.   On March 2009, it was the opinion of the attending physician, as well as  the consulting physician that the patient had received maximum benefit  from this hospitalization and  could be discharged home.  It was believed  that the patient probably had a small brain stem infarct even though one  was not seen on diagnostic studies.  Plans were made for the patient to  have outpatient physical therapy and occupational therapy and very close  followup.   MEDICATIONS AT DISCHARGE:  1. Zocor 40 mg daily.  2. Azor 5/40 one daily.  3. Plavix 75 mg daily.  4. Isosorbide mononitrate 300 mg daily.  5. Actos 30 mg daily.  6. Stadol nasal spray one spray one nostril every 4-6 hours as needed      for headache.  7. Aspirin 81 mg daily.  8. Ambien CR 12.5 mg at bedtime.  9. Tekturna HCT 300/12.5 one daily.   The patient is to be seen in the office in 2 weeks, sooner if any  changes, problems or concerns.      Ivery Quale, P.A.      Osvaldo Shipper. Spruill, M.D.  Electronically Signed    HB/MEDQ  D:  08/26/2007  T:  08/27/2007  Job:  [id number]

## 2010-09-18 NOTE — Discharge Summary (Signed)
NAMECADEE, AGRO NO.:  [id number]   MEDICAL RECORD NO.:  [id number]          PATIENT TYPE:  INP   LOCATION:  3020                         FACILITY:  [facility]   PHYSICIAN:  Payton Doughty, M.D.      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   DISCHARGE DIAGNOSIS:  Scarred L4-5.   PROCEDURE:  L4-5 laminectomy by Dr. Franky Macho.   DICTATED BY:  Dr. Trey Sailors.   SERVICE:  Neurosurgery.   COMPLICATIONS:  None.   DISCHARGE STATUS:  Well.   BODY OF TEXT:  A 55 year old.  Gross history and physical is recounted  in the chart by Dr. Franky Macho.  She had a foraminal distant on the left  side some years ago, she has had increase in pain.  This is a question  of disc recurrence.  She was admitted after I saw normal laboratory  values and underwent axillary and left L4-5 root.  Intraoperatively, it  was found she had scarred adhesions that were lysed.  Postoperatively,  she has had some left leg pain and some incisional pain.  She has been  mobilized in physical therapy, still has a bit of dorsiflexion and  weakness on the left side.  Other than that, her strength is full.  She  is up and about, eating and voiding normally.  She is being discharged  home in the care of her family with Ultram for pain.  Her followup will  be in the [facility] by a phone to Dr. Franky Macho.  She is going to  have home physical therapy set up.           ______________________________  Payton Doughty, M.D.     MWR/MEDQ  D:  03/30/2007  T:  03/30/2007  Job:  [id number]

## 2010-09-18 NOTE — Consult Note (Signed)
[name], [name]:  [id number]   MEDICAL RECORD NO.:  [id number]          PATIENT TYPE:  INP   LOCATION:  3002                         FACILITY:  [facility]   PHYSICIAN:  Marlan Palau, M.D.  DATE OF BIRTH:  [birth year]   DATE OF CONSULTATION:  07/09/2007  DATE OF DISCHARGE:                                 CONSULTATION   This is a neurology consult note on this patient done on July 09, 2007.   HISTORY OF PRESENT ILLNESS:  [name] is a 55 year old right-handed  black female born [birth year], with a history of diabetes,  hypertension, angina type pain who comes to [facility]  Emergency Room for an evaluation of onset of problems with double  vision, gait disturbance, dizziness that began on July 07, 2007, around  8 o'clock p.m.  The patient has gradually worsened over the last 2 days  to the point where she now requires assistance with walking.  The  patient does note a mild headache, feels some numbness on the right face  and has trouble with her vision and cannot to her eyes to the left side.  The patient has undergone a CT scan of the brain through the emergency  room which shows no acute changes.  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  The patient was on low-dose  aspirin prior to coming in.  Neurology was asked to see the patient for  further evaluation.  MRI scan has been ordered but not yet done.   PAST MEDICAL HISTORY:  Significant for  1. New onset of a right conjugate gaze deviation, diplopia, some left-      sided sensory symptoms by examination.  NIH stroke scale score of      8.  2. Diabetes.  3. Hypertension.  4. Dyslipidemia.  5. Gastroesophageal reflux disease, status post surgery.  6. History of angina.  7. Axillary cyst resections.  8. Breast reduction surgery.  9. Lumbosacral spine surgery in the past x2.   MEDICATIONS:  1. Imdur 30 mg daily.  2. Tekturna 300/25 mg daily  3. Tekturna 150 mg without hydrochlorothiazide one daily.  4. Actos 45 mg daily.  5. Astelin nasal spray.  6. Bystolic 10 mg daily.  7. Nexium 40 mg daily.  8. Ultracet p.r.n.  9. Sublingual nitroglycerin p.r.n.  10.Azor 10/40 mg tablet daily.  11.Ambien CR 12.5 mg nightly.  12.Aspirin 81 mg daily.   ALLERGIES:  The patient has no known allergies.   Smokes half pack of cigarettes a day.  Does not drink alcohol currently  but has history of alcohol abuse.   SOCIAL HISTORY:  The patient is divorced, lives in the [place], area.  She has one daughter who is alive and well.  She is on  disability due to recent back surgery.   FAMILY MEDICAL HISTORY:  Mother died with coronary artery disease,  congestive heart failure, diabetes.  Father died with congestive heart  failure, diabetes.  The patient has three sisters and two  half-sisters  and one half-brother who just recently died possibly due to trauma.   REVIEW OF SYSTEMS:  Notable for some recent headache, the patient claims  she feels cold, denies neck pain, does note some chest pain off and on,  denies shortness of breath, has had some nausea, no vomiting.  No  troubles controlling bowels or bladder.  No known blackout episodes,  does note some dizziness as above.   PHYSICAL EXAMINATION:  VITALS:  Blood pressure is 127/85, heart rate  112, respiratory 18, temperature afebrile.  GENERAL:  The patient is a minimally obese, black female who is alert  and cooperative at the time of examination.  HEENT:  Head is atraumatic.  Eyes:  Pupils are round and reactive to  light.  Disks are flat bilaterally.  NECK:  Supple.  No carotid bruits noted.  RESPIRATORY:  Clear.  CARDIOVASCULAR:  Regular rate and rhythm.  No obvious murmurs or rubs  noted.  EXTREMITIES:  Without significant edema.  NEUROLOGIC:  Cranial nerves as above.  The patient has some depression  of the left nasolabial fold, asymmetric smile.  The patient  reports  decreased pinprick sensation on the right forehead compared to the left  but on the left lower face as compared the right, has a prominent gaze  deviation to the right.  I am unable to get the patient to deviate eyes  across midline to the left.  Visual fields are full.  The patient  reports double vision on gaze.  Speech is slightly dysarthric.  Motor  testing reveals good strength to direct testing on all fours but patient  has drift on both lower extremities and complains of back pain with  lifting the leg.  The patient has no drift in the upper extremities.  No  obvious dysmetria is noted with finger-nose-finger or heel-to-shin.  Gait was not tested.  Deep tendon reflexes are somewhat brisk in the  arms, possibly a bit more on the left than the right, depressed  symmetric in the legs.  Toes are neutral bilaterally.  Pinprick  sensation on the arms is symmetric and normal, decreased on the left  legs as compared to the right.  The patient has symmetric vibratory  sensation throughout.  No evidence of aphasia is noted.  The patient  will neglect the left leg to double simultaneous stimulation not the  face or the arms.   LABORATORY VALUES:  Currently notable for a white count of 5.6,  hemoglobin 10.8.  Repeat, however was, 16.3, hematocrit 48, MCV of 103,  platelets of 143.  INR of 0.9.  Sodium 136, potassium 4.4, chloride of  106, glucose of 207, BUN of 8, creatinine 0.9.  CK-MB fraction less than  1, troponin-I of less than 0.05.   CT is as above.   IMPRESSION:  1. Onset of gaze deviation to the right, double vision and sensory      symptoms.  NIH stroke scale score of 8.  2. Hypertension.  3. Diabetes.   Patient has multiple risk factors for stroke including tobacco abuse.  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  Consider basilar artery embolus.  The patient does require  further workup at this point.  I suspect a demyelinating disease does  need be considered but this is  less likely.  The patient will require  admission at this point.  Patient is not a TPA candidate due to duration  of symptoms.   PLAN:  1. MRI scan of the brain.  2. MRI angiogram of intracranial and extracranial vessels.  3. A 2-D echocardiogram.  4. Urine drug screen.  5. Physical and occupational therapy evaluation.  6. Plavix therapy for now.   Will follow the patient's clinical course while in-house      C. Lesia Sago, M.D.  Electronically Signed     CKW/MEDQ  D:  07/09/2007  T:  07/10/2007  Job:  [id number]

## 2010-09-18 NOTE — Op Note (Signed)
NAMEBAYLEA, MILBURN NO.:  [id number]   MEDICAL RECORD NO.:  [id number]          PATIENT TYPE:  INP   LOCATION:  3020                         FACILITY:  [facility]   PHYSICIAN:  Coletta Memos, M.D.     DATE OF BIRTH:  [birth year]   DATE OF PROCEDURE:  03/27/2007  DATE OF DISCHARGE:                               OPERATIVE REPORT   PREOPERATIVE DIAGNOSIS:  Recurrent displaced disk L4-5 far lateral left.   POSTOPERATIVE DIAGNOSIS:  Scar tissue left L4-5 far lateral.   PROCEDURE:  Left L4-5 redo far lateral diskectomy with microdissection.   SURGEON:  Coletta Memos, M.D.   ASSISTANT:  Payton Doughty, M.D.   COMPLICATIONS:  None.   FINDINGS:  Scar tissue, no disk herniation, no large a mass in the disk  space or in the perineural space.   DESCRIPTION OF PROCEDURE:  [name] is a woman whom I did in August  for a far lateral disk herniation.  I took her to the operating room and  did what I thought  was an uncomplicated procedure.  Postoperatively she  complained of significant pain.  I did an MRI which showed a great deal  of scar tissue.  There was mention that there could be some recurrent  disk material in the scar tissue.  I treated her with epidural steroids,  Lyrica and Neurontin. They were ineffective and she still complained of  pain.  I had a myelogram and post myelogram CT performed.  It showed a  soft tissue mass in the far lateral position.  Secondary to her pain and  the lack of clarity as to whether this was disk or not, I recommended  and she agreed to go back to the operating room for exploration.   DESCRIPTION OF PROCEDURE:  [name] was brought to the operating  room, intubated and placed under general anesthetic without difficulty.  I infiltrated 10 mL 0.5% lidocaine 1:200,000 strength into the old  incision in the left paraspinous musculature.  I opened the skin and  took this down to the thoracolumbar fascia. I exposed the lamina of L3  and  L4 without difficulty.  I was able to easily peel back over the  dissection area in the L4 pars.  I brought the microscope into the  operative field and proceeded with dissection. I removed some very soft  tissue in the perineural space. There was no compression and there was  no tethering whatsoever.  I found the disk space again, opened that and  again explored the disk space with microdissection. I explored medially  and laterally.  Dr. Channing Mutters came in, he also inspected the nerve root.  He  did not appreciate any compression of the nerve root or tethering of it.  After full decompression, I then placed some Depo-Medrol over the  resection site.  I then infiltrated another  10 mL of 0.5% lidocaine 1:200,000 strength epinephrine into the  paraspinous muscle.  I then closed the wound in layered fashion using  Vicryl sutures to reapproximate the thoracolumbar fascia subcutaneous  and subcuticular layers.  Dermabond used for a sterile dressing.           ______________________________  Coletta Memos, M.D.     KC/MEDQ  D:  03/27/2007  T:  03/28/2007  Job:  [id number]

## 2010-09-18 NOTE — Consult Note (Signed)
NAMESUA, SPADAFORA NO.:  [id number]   MEDICAL RECORD NO.:  [id number]          PATIENT TYPE:  INP   LOCATION:  3022                         FACILITY:  [facility]   PHYSICIAN:  Deanna Artis. Hickling, M.D.DATE OF BIRTH:  28-Apr-1956   DATE OF CONSULTATION:  07/12/2008  DATE OF DISCHARGE:                                 CONSULTATION   CHIEF COMPLAINT:  Left-sided numbness, left visual field dysfunction,  and left-sided headache.   HISTORY OF PRESENT CONDITION:  The patient is a 55 year old woman  previously seen by my partner, Dr. Lesia Sago July 09, 2007.  At that  time, the patient presented with complaints of double vision, gait  disturbance and dizziness that began 2 days before she presented.  She  gradually worsened.  She had onset of right conjugate gaze fixation,  diplopia, left-sided sensory symptoms by examination.  She had an NIH  stroke scale of 8.  She had risk factors for stroke including diabetes,  hypertension, and dyslipidemia.  Examination by Dr. Anne Hahn revealed some  depression to the left nasolabial fold with an asymmetric smile,  decreased pinprick sensation in the left lower face compared with the  right and a prominent gaze deviation to the right.  She was not able to  deviate her eyes across the midline to the left.  She had dysarthric  speech, normal motor testing other than some drift in both lower  extremities, this was associated with low back pain.  There was no drift  in the upper extremities.  Reflexes were brisk and her toes were  neutral.  She had symmetric sensation except decreased in the left leg  comparison with the right.   The patient had an MRI scan of the brain which was entirely normal.  She  was discharged with a diagnosis of cerebral artery occlusion with  cerebral infarction, however the record showed that there was no  evidence of acute stroke.  She was given an eye patch.  She was sent  home on antihypertensives,  Plavix, oral hypoglycemics, and Stadol nasal  spray for her headache.   She has been stable until 1 week prior to admission.  She had noted  problems with increasing facial numbness and dysesthesias on the left  greater than the right, increasing weakness of her left lower extremity  and was worried that she might be having a recurrent stroke.  She had  her blood pressure checked by a nurse at her church service which showed  blood pressure of 140/120.  She was brought to the hospital where she  was admitted by Dr. Shana Chute with left facial dysesthesias and  hypertension.   She responded very nicely to blood pressure medication.  She said that  her blood pressure went into the 80/50 range, but the lowest documented  blood pressure was 98/59 and the lowest diastolic blood pressure was 56.  In my mind, this raises questions of compliance of medication but she  claims adamantly that she has been compliant with medication.   I was asked by Dr. Shana Chute to see her to determine  etiology of her  dysfunction and make recommendations for further workup and treatment.  She has continued to complain of numbness on the left side of her face.  The numbness extends into her arm and to her leg.   She noted that as early as Tuesday, July 05, 2008 that with increasing  stress her left eye began to tear up and she had some twitching of the  eyelid and left temporal headache.  The patient went to school, started  to have some chest discomfort and did not see her physician.  Her  symptoms continued from Wednesday through Sunday until she was noted to  have hypertension.   She describes her numbness is burning and tingling in the face with  decreased sensation over the left arm.  Dilaudid helped her headache but  because she has had problems with narcotic abuse in the past, she is no  longer taking that medication this hospitalization.   The patient has had problems with low back spondylosis and has had  a  lumbar laminectomy on two occasions.   PAST MEDICAL HISTORY:  Remarkable for:  1. Hypertension.  2. Diabetes mellitus.  3. Dyslipidemia.  4. Coronary artery disease.  5. Gastroesophageal reflux disease status post surgery.  6. History of angina.   PAST SURGICAL HISTORY:  Resections of axillary cysts, breast reduction  surgery, and lumbar laminectomy x2.  She has also had hysterectomy,  tubal ligation, and cholecystectomy.   CURRENT MEDICATIONS:  1. Nexium 40 mg daily.  2. Tekturna and hydrochlorothiazide 300/12.5 one daily.  3. Lipitor 40 mg daily.  4. Isosorbide dinitrate 3 mg daily.  5. Azor 5/40 one daily.  6. Actos 30 mg daily.  7. Ambien CR 12.5 mg at bedtime.   DRUG ALLERGIES:  None known.   FAMILY HISTORY:  She has three sisters and two half-sisters.  There is  history of coronary artery disease, congestive heart failure and  diabetes in her mother and congestive heart failure, diabetes and  cerebrovascular accident in her father.   SOCIAL HISTORY:  The patient lives alone.  She is mother of two.  She  claims to smoke one pack of cigarettes per week and said that her last  smoke was a few days ago.  She vows to quit when she leaves.  We  discussed this at length.  She has a history of alcohol use and previous  drug use, but does not use either substance at this time.   REVIEW OF SYSTEMS:  Twelve-system review is otherwise negative except as  noted above in the history of present illness and her past medical  history.   PHYSICAL EXAMINATION:  GENERAL:  Today, this is a well-developed, well-  nourished right-handed woman in mild-to-moderate distress with a left  frontal headache.  VITAL SIGNS:  Blood pressure 124/74, resting pulse 58, respirations 20,  temperature 98.5.  HEAD, EYES, EARS, NOSE AND THROAT:  No signs of infection.  NECK:  Supple neck, full range of motion.  No cranial or cervical  bruits.  LUNGS:  Clear to auscultation.  HEART:  No murmurs.   Pulse is normal.  ABDOMEN:  Soft, nontender.  Bowel sounds normal.  EXTREMITIES:  Well-formed without edema, cyanosis, alterations in tone  or tight heel cords.  SKIN:  No lesions.  VASCULAR:  Tone was normal.  NEUROLOGIC:  Mental status; the patient is awake, alert, attentive,  appropriate, normal enunciation and ability to name objects, repeat  phrases and follow commands.  Cranial  nerves:  Round reactive pupils.  Visual fields full to double simultaneous stimuli.  For my physician's  assistant, she seemed to have decreased sensation in the left lateral  visual field.  Symmetric facial strength.  Midline tongue.  Air  conduction greater than bone conduction bilaterally.  Motor examination  no pronator drift.  The patient shows excellent strength in her arms and  legs.  There is some decreased effort in the left lower extremity  because of pain.  Distally, however, she has normal strength.  Deep  tendon reflexes were symmetric and present.  No clonus was present.  The  patient had bilateral flexor plantar responses.   Her gait is antalgic, slightly broad-based, but steady.  She can walk on  her heels and her toes, perform a tandem with a little bit of difficulty  and has some problems with her balance.  Deep tendon reflexes were  symmetric, diminished at the knees, normal at the biceps, diminished  elsewhere.  The patient had bilateral flexor plantar responses.   I carried out a detailed sensory examination.  She splits the midline to  cold.  She sense tuning fork more prominently on the right forehead than  the left.  She has an inconsistent visual field examination.  Her finger-  to-nose, the eyes closed inconsistently past pointed to the right side  when touching with the left hand, but she improved with trials and had  no difficulty showing any other sensory ataxia.   I reviewed her MRI scans and they showed some small lacunar lesions in  the anterior limb and the internal  capsule and the genu of the internal  capsule on the right side.  These are remote.  No other lesions exist.  These lesions may be dilated perivascular spaces because they are not  seen on flare.  No acute strokes were seen.  Vessels at the base of the  brain showed normal patency.  MRA was not carried out.   LABORATORY STUDIES:  Sodium 141, potassium 3.3, chloride 104, CO2 24,  BUN 9, creatinine 0.67, glucose 112, white blood cell count 5700,  hemoglobin 12.6, hematocrit 36.8, platelet count 188,000.   I reviewed also a CT scan of the head which was unremarkable.  She had  carotid Dopplers which showed mild mixed plaque at the origin and  proximal internal carotid artery without hemodynamically significant  stenosis.  The patient's last echocardiogram from March 2009 showed  normal left ventricular systolic function and ejection fraction of 55-  60% and no source of lesions.  Given this functional nature of her  examination, I do not think that further workup is needed.   The patient had a swallowing study on July 10, 2008 which she passed.  She will be going home on the following medications: Plavix 75 mg daily.  She has been taking aspirin 81 mg daily, but that has been causing  bruising and I told her to stop it.  Protonix 80 mg daily, Crestor 20 mg  daily.  She tells me that she was on Lipitor and I suspect she will  return to that.  Tekturna 300 mg with 12.5 mg of hydrochlorothiazide,  Norvasc 2.5 mg daily, Afrin nasal spray and Tylenol for pain.  She is on  Lovenox for DVT prophylaxis.   I reviewed the things left by the stroke team.  We discussed smoking  cessation.  She will be going home on statin drugs.  She does not need  physical or occupational  therapy though this was considered.  She also  does not need 2-D echocardiogram.  She was not given IV TPA, her  symptoms were much longer than 3 hours on presentation.   In summary, I believe that the patient's complaints of  functional based  on her sensory and eye examination for visual fields that are functional  and inconsistent (782.0).  She has an antalgic gait from her low back  781.2.  I do not believe that she had a stroke and at this time I  believe that she could be discharged.  Finally, she has a headache of  unknown etiology 784.0.  This may be episodic tension type headache.  She states categorically that it is not one of her migraines.  I do not  think that we can treat this with narcotic medications because her past  history of addiction and intolerance to them.  Similarly, we should not  be giving her nonsteroidal anti-inflammatory agents.   She needs to take her medicines to treat her risk factors and to stop  smoking.  We will see her as needed.  This has been discussed with Dr.  Shana Chute by phone.      Deanna Artis. Sharene Skeans, M.D.  Electronically Signed     WH[facility]/MEDQ  D:  07/12/2008  T:  07/13/2008  Job:  [id number]   cc:   Osvaldo Shipper. Spruill, M.D.

## 2010-09-18 NOTE — Assessment & Plan Note (Signed)
[facility]                         GASTROENTEROLOGY OFFICE NOTE   NAME:Jennifer, [name]                        MRN:          [id number]  DATE:11/17/2006                            DOB:          [birth year]    CHIEF COMPLAINT:  Constipation and bloating, weight gain, question fluid  in the abdomen.   HISTORY:  [name] returns to see me.  I had seen her last year and in the  spring because of diarrhea, predominantly irritable bowel syndrome.  Now  she is constipated.  She has gained five or six pounds.  She feels  bloated and wonders if there is fluid in her abdomen.  No bleeding.  She  is off the Colestid that had helped her diarrhea before.  She is off  Lomotil as well.  She is currently on temporary again with some back  pain.   MEDICATIONS:  Listed and reviewed in the chart.   PHYSICAL EXAMINATION:  VITAL SIGNS:  Weight 154 pounds, she was 147  pounds in mid March.  Height 5 feet 4.  Pulse 78, blood pressure 158/84.  ABDOMEN:  Soft and nontender without organomegaly or mass.  Perhaps it  is a little more distended.  She says that it has gone anywhere from 37-  39 inches in measurement and says that it is increasing.   ASSESSMENT:  I think she has irritable bowel syndrome and bloating.  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   PLAN:  MiraLax, take this once a day.  Check TSH.  Nexium samples are  given to the patient for reflux symptoms.  Ultrasound to assess for any  ascites and reassurance.  Further plans pending response to these  therapies and the ultrasound.     Iva Boop, MD,FACG  Electronically Signed    CEG/MedQ  DD: 11/17/2006  DT: 11/18/2006  Job #: [id number]   cc:   [name], M.D.

## 2010-09-18 NOTE — H&P (Signed)
NAMERADHA, COGGINS NO.:  [id number]   MEDICAL RECORD NO.:  [id number]          PATIENT TYPE:  INP   LOCATION:  1826                         FACILITY:  [facility]   PHYSICIAN:  Eric L. August Saucer, M.D.     DATE OF BIRTH:  [birth year]   DATE OF ADMISSION:  07/10/2008  DATE OF DISCHARGE:                              HISTORY & PHYSICAL   PATIENT LOCATION:  Telemetry, pending.   CHIEF COMPLAINT:  Increasing left facial dysesthesias with uncontrolled  hypertension, rule out stroke.   HISTORY OF PRESENT ILLNESS:  This is one of several [facility]  admissions for this 55 year old single black female with past history of  hypertension, hyperlipidemia, coronary artery disease, and previous CVA.  The patient relates she had been doing moderately well up until the past  week.  She states she had been pushing herself more with attempts at  moving.  She began noting problems with increasing a facial numbness and  dysesthesias, more on the left side than the right.  She also noted more  increasing weakness in her left lower leg.  She became concerned as this  was similar to her previous stroke.  The patient did go to [employer] today.  Blood pressure was checked by a nurse according to the  patient and recorded a reading of 140/120.  The patient did not feel  well as the day progressed with feeling some vague dizziness as well.  She also experienced sharp anterior chest wall pain.  No substernal  pressure or diaphoresis.  She subsequently came to the emergency room  for further evaluation and is admitted at this time.   PAST MEDICAL HISTORY:  Notable for previous admission of March 2009 for  left-sided blurred vision and dysesthesias with ultimate diagnosis of  cerebral artery occlusion.  Her CT scans were negative at that time.  Past history is significant for longstanding hypertension, diabetes  mellitus, and dyslipidemia.  She presently still smokes 1 pack of  cigarettes per week at this time.   REVIEW OF SYSTEMS:  Otherwise as noted.   SOCIAL HISTORY:  The patient lives alone, has 2 children.  She still  smokes as noted.  She does not drink.  She had a distant history of EtOH  use in the past.   The patient has no known allergies.   PRESENT MEDICATIONS:  1. Tekturna HCTZ 300/12.5 one daily.  2. Lipitor 40 mg daily.  3. Nexium 40 mg daily.  4. Isosorbide dinitrate 30 mg daily.  5. Actos 30 mg daily.  6. Ambien CR 12.5 mg daily.  7. She also takes Azor 5/40 one daily.   PHYSICAL EXAMINATION:  GENERAL:  She is a well-developed, well-nourished  black female in no acute distress.  VITAL SIGNS:  Initially, blood pressure 164/98, pulse 104, respiratory  rate 18, and temperature 97.9.  Final reading of blood pressure 109/65,  pulse of 67, respiratory rate 14, and temperature 98.1.  HEENT:  Head is normocephalic and atraumatic without bruits.  Extraocular muscles are intact with some lag to left lateral gaze.  She  has occasional twitching of the left supraorbital muscle.  Pupils are  equal and reactive.  She does have small posterior cervical nodes on the  right versus left, they were nontender.  She has dentures.  Posterior  pharynx clear.  NECK:  No enlarged thyroid, otherwise.  LUNGS:  Clear to auscultation without wheezes or rales.  No E to A  changes.  ABDOMEN:  Soft without mass or tenderness.  NEUROLOGIC:  She is alert and oriented x3.  Cranial nerves intact.  She  has decreased rapid alternating movement, left upper extremity versus  the right.  She has 4+/5 dorsiflexion on the right ankle versus 3+/4  over the left.  She has absent Babinski's bilaterally.  SKIN:  Otherwise without active lesions.   CBC reveals WBC of 5,700, hemoglobin 12.6, and hematocrit of 36.8.  Normal differential.  Chemistry panel, sodium 141, potassium 3.3,  chloride 104, CO2 of 28, BUN of 99, glucose of 99, and creatinine 0.65.  Calcium 9.1, total  protein 6.3, and albumin 3.6.  SGOT and SGPT is 20  and 13 respectively.  Chest x-ray reveals no acute cardiopulmonary  abnormality.  CT scan of the brain demonstrated no acute obvious  abnormalities well.   IMPRESSION:  1. Left facial dysesthesias, rule out early recurrent cerebrovascular      accident versus other.  2. 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  She does have      residual left lower extremity weakness.  3. Hypertension, need for further control.  4. Hyperlipidemia.  5. Tobacco abuse.  6. The patient needs a safer home environment.   PLAN:  The patient is admitted to telemetry at this time for further  evaluation.  She will undergo MRI scan as well.  Continue her on aspirin  and Plavix at this time.  Neurologic opinion pending results of the  above.  She will continue her present regimens as before.  Further  therapy thereafter.           ______________________________  Lind Guest. August Saucer, M.D.     ELD/MEDQ  D:  07/11/2008  T:  07/11/2008  Job:  [id number]

## 2010-09-18 NOTE — Op Note (Signed)
NAMEKHLOI, RAWL NO.:  [id number]   MEDICAL RECORD NO.:  [id number]          PATIENT TYPE:  AMB   LOCATION:  SDS                          FACILITY:  [facility]   PHYSICIAN:  Coletta Memos, M.D.     DATE OF BIRTH:  [birth year]   DATE OF PROCEDURE:  12/08/2006  DATE OF DISCHARGE:                               OPERATIVE REPORT   PREOPERATIVE DIAGNOSIS:  1. 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  2. Left L4 radiculopathy.   POSTOPERATIVE DIAGNOSES:  1. 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  2. Left L4 radiculopathy.   PROCEDURE:  Left far lateral L4-5 diskectomy with microdissection.   COMPLICATIONS:  None.   SURGEON:  Coletta Memos, M.D.   ASSISTANT:  Danae Orleans. Venetia Maxon, M.D.   INDICATIONS:  Raymond Gurney presented with significant pain in the left  lower extremity.  MRI showed a far lateral disk herniation in the  foramen at L4-5.  I therefore recommended, and she agreed to undergo  operative decompression.   OPERATIVE NOTE:  [name] was brought to the operating room,  intubated and placed under a general anesthetic without difficulty.  She  was rolled prone onto the Wilson frame, and all pressure points were  properly padded.  Her back was prepped and she was draped in a sterile  fashion.  I infiltrated 16 mL of 0.5% lidocaine with 1:200,000 strength  epinephrine.  I opened the skin with a #10 blade and took this down to  the thoracolumbar fascia.  I then exposed the lamina of L4 and L5.  I  then performed a far lateral diskectomy at L4-5, after first drilling  out the pars interarticularis using a high-speed drill.  I removed the  ligamentum flavum and then exposed the L4 root.  I brought the  microscope into the operative field, and with Dr. Fredrich Birks assistance we  were able to identify the nerve root in the ridge medial to the nerve  root, where the disk herniation was.  I opened the disk space with the  #11 and 15 blades.  We then removed disk in a  piecemeal fashion to  decompress the left L4 nerve root.  After microdissection, I was able to  appreciate good decompression and no fragments of disk material which  could be fished out or were remaining.  I then irrigated.  I then placed  Solu-Medrol over the resection site.  I then closed the wound in layered  fashion, using Vicryl sutures to reapproximate the thoracolumbar fascia,  subcutaneous and subcuticular layers.  Dermabond was used for sterile  dressing.          ______________________________  Coletta Memos, M.D.    KC/MEDQ  D:  12/08/2006  T:  12/08/2006  Job:  [id number]

## 2010-09-21 NOTE — Discharge Summary (Signed)
[name] NO.:  [id number]   MEDICAL RECORD NO.:  [id number]          PATIENT TYPE:  INP   LOCATION:  3734                         FACILITY:  [facility]   PHYSICIAN:  Mohan N. Sharyn Lull, M.D. DATE OF BIRTH:  [birth year]   DATE OF ADMISSION:  05/23/2004  DATE OF DISCHARGE:  05/26/2004                                 DISCHARGE SUMMARY   ADMISSION DIAGNOSES:  1.  Chest pain, rule out myocardial infarction.  2.  Hypertension.  3.  Non-insulin-dependent diabetes mellitus.  4.  Hypercholesterolemia.  5.  Tobacco abuse.  6.  Gastroesophageal reflux disease.  7.  Positive family history of coronary artery disease.   DISCHARGE DIAGNOSES:  1.  Status post chest pain, negative Persantine Myoview.  2.  Hypertension.  3.  Non-insulin-dependent diabetes mellitus.  4.  Hypercholesterolemia.  5.  Tobacco abuse.  6.  Gastroesophageal reflux disease.  7.  Positive family history of coronary artery disease.   DISCHARGE MEDICATIONS:  1.  Lotrel 5/20 one capsule twice daily.  2.  Imdur 30 mg one tablet daily.  3.  Actos 15 mg one tablet daily.  4.  Lipitor 10 mg one tablet daily.  5.  Nexium 40 mg one tablet daily.  6.  High fiber for breakfast.  7.  Baby aspirin 81 mg one tablet daily.   ACTIVITY:  As tolerated.   DIET:  Low-salt, low-cholesterol, 1800 calorie, ADA diet.   FOLLOW UP:  With Dr. Shana Chute in two weeks.   CONDITION ON DISCHARGE:  Stable.   BRIEF HISTORY AND HOSPITAL COURSE:  Mrs. Cain Saupe is a 55 year old black  female with past medical history for hypertension, non-insulin-dependent  diabetes mellitus, hypercholesterolemia, tobacco abuse, positive family  history of coronary artery disease.  She came to the emergency room  complaining of retrosternal, sharp and pressure, chest pain off and on for  the last two weeks.  As the pain got worse progressively, she decided to  come to the ER.  Chest pain was associated with nausea, diaphoresis, and  radiated to the neck.  Denies any shortness of breath but complains of  palpitations described as skipping a heartbeat.  No dizziness,  lightheadedness, or syncope.  The patient took one sublingual nitroglycerin  with partial relief so she decided to come to the ER.  The patient denies  any chest pain in relation to food, breathing, movement, denies any cough,  fever or chills.   PAST MEDICAL HISTORY:  As above.   PAST SURGICAL HISTORY:  1.  She is status post cholecystectomy.  2.  Status post hysterectomy.  3.  Status post laparotomy for revision of adhesions.  4.  Status post hiatal hernia repair.  5.  Status post bilateral breast reduction surgery in the past.   ALLERGIES:  No known drug allergies.   MEDICATIONS AT HOME:  1.  Lotrel 5/20 p.o. b.i.d.  2.  Imdur 30 mg p.o. daily.  3.  Actos 15 mg p.o. daily.  4.  Lipitor 10 mg p.o. daily.  5.  Nexium 40 mg p.o. daily.   SOCIAL HISTORY:  She is separated.  Has one child.  Smokes less than one  pack per day for 30+ years.  No history of alcohol abuse.  She does clerical work.   FAMILY HISTORY:  Father died of congestive heart failure.  A brother died of  MI.  She had coronary artery disease.  Subsequently had coronary artery  bypass grafting.  She has five sisters.  Two sisters are hypertensive.  One  sister has aneurysm of the brain.   PHYSICAL EXAMINATION:  GENERAL:  She was alert and oriented x3, in no acute  distress.  VITAL SIGNS: Blood pressure 134/86, pulse 74 and regular.  HEENT: Conjunctivae pink.  NECK:  Supple.  No JVD.  No bruits.  LUNGS:  Clear to auscultation without rhonchi.  CARDIOVASCULAR:  S1 and S2 was normal.  There was no S3 gallop.  ABDOMEN:  Soft.  Bowel sounds present.  There was mild epigastric  tenderness.  There was no guarding or rebound.  EXTREMITIES:  No clubbing,  cyanosis or edema.Marland Kitchen   LABORATORY DATA:  The EKG showed normal sinus rhythm with septal Q waves.  There were no acute ischemic  changes.  Cholesterol was 213. LDL was 140  which was elevated.  HDL was 47.  Three sets of cardiac enzymes were  negative.  Hemoglobin A1C was 6.2. Sodium 140, potassium 3.9, chloride 108,  glucose 118, BUN 8, creatinine 0.7.  Hemoglobin 13.4, hematocrit 40.3, white  count 6.6.  Persantine Myoview done on January 20 showed no perfusion  defect, EF of 70%.   BRIEF HOSPITAL COURSE:  The patient was admitted to the telemetry unit and  MI was ruled out by serial enzymes and EKG.  She did not have any episodes  of chest pain during the hospital stay.  The patient underwent  Persantine Myoview on January 20, which showed no evidence of reversible  ischemia with EF of 70%.  The patient has been ambulating in the hallway  without any problems.  The patient was discharged home on the above  medications and the patient will be followed by Dr. Shana Chute in two weeks.      MNH/MEDQ  D:  08/16/2004  T:  08/16/2004  Job:  [id number]   cc:   Osvaldo Shipper. Spruill, M.D.  [address] [id number]  [place]  [place] [address]  Fax: [phone]

## 2010-09-21 NOTE — Assessment & Plan Note (Signed)
[facility]                         GASTROENTEROLOGY OFFICE NOTE   NAME:ELLERBECeciley, [name]                        MRN:          [id number]  DATE:06/03/2006                            DOB:          1955/11/06    CHIEF COMPLAINT:  Abdominal pain, diarrhea.   ASSESSMENT:  Chronic recurrent abdominal pain and diarrhea. Could very  well be irritable bowel syndrome. She had been treated with some  cephalexin for breast cysts and what may be some superlative cystic  lesions in the axillary area a few months ago so C-diff is possible.  Acute infectious diarrhea is possible as well. Partial bowel obstruction  seems less likely.   RECOMMENDATIONS AND PLAN:  1. Metronidazole 500 mg t.i.d. for 10 days.  2. Lomotil p.r.n.  3. Get records of previous colonoscopy performed by Dr. Madilyn Fireman.  4. Check CBC with diff and a sed rate as well.  5. The patient was asking about paperwork for short-term disability. I      told her that I could not predict disability at this time. That she      may be in too pain and with too much diarrhea to return to work      right now. If she faxes papers that need information, I can fill      that out, but I cannot guarantee disability at this time.   HISTORY:  This is a 55 -year-old [place] American woman that looks to  have had problems with abdominal pain and heartburn and reflux symptoms  on-and-off for a number of years. She has previously been followed by  Dr. Madilyn Fireman. She actually saw him a couple of weeks ago, but was  dissatisfied with his evaluation as he talked more about reflux. She is  having increasing heartburn, but what she is telling me is that she has  had left lower quadrant pain and diarrhea more so than anything. She  last had an upper endoscopy June 04, 2005. H-pylori testing negative.  She had some antral erythema. Otherwise, normal. She had an upper GI  small bowel follow through May 30, 2006, that was normal  except what  looked like a fundoplication repair. She had numerous clips in that  area. Dr. Madilyn Fireman had suggested a 24-hour pH study with a nasal catheter  and she does not want to do that. She denies fever or chills, though she  has felt somewhat hot at times it sounds like. X-rays from February 04, 2006, because of diarrhea and fever, show a negative KUB ordered by Dr.  Shana Chute. I have reviewed his other office notes. She does have a history  of distal lower cervical esophagus from a thin web-like lesion without  any significant luminal narrowing. Previous x-rays have suggested  unwrapping of her hiatal hernia based upon noting only clips, but no  evidence of wrap at that time.   MEDICATIONS:  1. Tekturna 150 mg daily.  2. Tramadol p.r.n.  3. Exforge 10 mg/160 mg daily.  4. Lipitor 40 mg daily.  5. Actos 30 mg daily.  6. Alprazolam 1 mg t.i.d.  7. Nexium 40 mg daily.   PAST MEDICAL HISTORY:  1. Hypertension.  2. Dyslipidemia.  3. Diabetes mellitus.  4. Prior fundoplication.  5. Cholecystectomy.  6. She lists angioplasty though Dr. Annitta Jersey notes from 2006, do not      indicate that. She had a negative stress test at that time.  7. Hysterectomy.  8. Tubal ligation.  9. Bilateral breast reduction.   DRUG ALLERGIES:  None known.   SOCIAL HISTORY:  She is living with her sister. Her mother recently  died. The patient is living in [place]. She works for a [employer]. She is a chronic smoker. No alcohol. 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   FAMILY HISTORY:  Mother had breast cancer. Heart disease in her parents.  There is diabetes in the family. No colon cancer reported.   REVIEW OF SYSTEMS:  Eyeglasses. Allergies. Chronic back pain. She  apparently has some breast lumps at times. She denies any bleeding with  her diarrhea. Note, that Dr. Madilyn Fireman had done colonoscopy at least once if  not twice in the past. We could  not find those records in [place].  Duodenal biopsies have been negative for sprue.   PHYSICAL EXAMINATION:  Reveals a middle-aged black woman in no acute  distress.  VITAL SIGNS: Show pulse 85, blood pressure 178/100, height 5 feet, 4  inches; weight is 149 pounds.  EYES: Anicteric.  NECK: Is supple.  CHEST: Is clear.  HEART: S1, S2. No murmurs, rubs or gallop.  ABDOMEN: Shows two lower abdominal surgical scars to the left and to the  right of the umbilicus. She is tender in the lower quadrants without  organomegaly or mass or rebound. Bowel sounds are present.  LYMPHATIC: No neck or supraclavicular nodes.  EXTREMITIES: No edema.  SKIN: Slightly dry in the lower extremities. Otherwise, free of lesions.  PSYCH: She is alert and oriented x3.   Data reviewed as above.   I appreciate the opportunity to care for this patient.     Iva Boop, MD,FACG  Electronically Signed    CEG/MedQ  DD: 06/03/2006  DT: 06/03/2006  Job #: [id number]   cc:   [name]. Spruill, M.D.

## 2010-09-21 NOTE — Op Note (Signed)
[facility]  Patient:    [name], [name]                        MRN: [id number] Proc. Date: 07/09/00 Attending:  Ronaldo Miyamoto L. Franky Macho, M.D.                           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  POSTOPERATIVE DIAGNOSIS:  C5-6 right, right C6 radiculopathy.  OPERATION PERFORMED:  SURGEON:  Kyle L. Franky Macho, M.D.  ASSISTANT:  Payton Doughty, M.D.  FINDINGS: 1. A large herniated disk at C5-6.  Nothing else unusual. 2. At intubation it was noticed that [name] had large pedunculated    solid masses in the oropharynx.  We will have the ENT surgeon come up    for consultation while she is under general anesthetic.  ANESTHESIA:  General.  INDICATIONS FOR PROCEDURE:  The patient is a  55 year old woman who presented to my office February 1 for evaluation of neck and left upper extremity weakness and facial weakness and what she felt was a stroke.  MRI showed a displaced disk at C5-6 on the right side causing cord distortion.  I recommended and she agreed to undergo anterior cervical diskectomy, subsequent arthrodesis and fusion to relieve the pressure on the spinal cord and to remove the herniated disk.  DESCRIPTION OF PROCEDURE:  [name] was taken to the operating room, intubated and placed under general anesthetic without difficulty.  She was placed in cervical extension on a cerebellar head rest with 10 pounds of cervical traction applied via chin strap.  Her neck was prepped and then she was draped in a sterile fashion.  I infiltrated the proposed incision with 0.5% lidocaine 1:200,000 strength epinephrine for a total of approximately 3 cc.  I made a skin incision with a #10 blade and took this down to the platysma.  This was done from the midline to the medial border of the left sternocleidomastoid muscle.  I divided the platysma in a horizontal fashion. I controlled bleeding in the subcutaneous  tissues with bipolar cautery.  I identified the sternocleidomastoid and developed the groove between the sternocleidomastoid and the medial strap muscles.  Using a handheld retractor, I retracted the medial musculature medially and exposed the anterior cervical spine.  I used the Kittners to remove the soft tissue from the anterior aspect of the anterior cervical spine.  I placed the spinal needle.  The first x-ray I believe that the needle was at 6-7.  I moved it up one disk space.  The second x-ray I could see clearly that the needle was now at C5-6.  The omohyoid was also retracted medially.  I reflected the longus colli muscles laterally using monopolar cautery on the right and left sides.  I then placed a self-retaining Caspar retractor overlying the C5-6 disk space bridging the C5-6 disk space.  I opened the disk with a #15 blade took this down the length of the blade.  I then used a curet and a pituitary rongeurs to remove disk material.  I brought the microscope in to aid in microdissection and then also used the curets to scrape end plate.  I was able to see where the disk herniation was via a hole in the posterior longitudinal ligament and I was able to remove that with a nerve hook and pituitary rongeur.  I then decompressed  the dura laterally until the nerve roots on the right and left sides were clearly visible.  I inspected both neural foramina with a small nerve hook to ensure that was no disk material or compression on those nerve roots.  I irrigated the wound then.  I then placed a 7 mm allograft into the disk space.  I removed the microscope and then prepared the C5 and C6 vertebral bodies for the subsequent arthrodesis.  I performed the arthrodesis with a 16 mm Synthes plate and then drilled, tapped and placed four 14 mm screws, two in C5, two in C6.  I irrigated the wound once more.  I placed the locking screws.  I irrigated again.  Another X-ray was performed and  showed the plate and screws to be in good position along with the bone plug.  I then removed the retractor.  I then closed the wound in a layered fashion using Vicryl sutures reapproximating the platysma and then the subcutaneous layer.  Dermabond was used for sterile dressing. The patient was then kept under general anesthetic so that the ENT surgeon, Lucky Cowboy, M.D. could inspect the lesions and do whatever she felt was necessary.  I spoke with [name] daughter to inform her of the inspection by Dr. Lucky Cowboy.  She understood. DD:  07/09/00 TD:  07/10/00 Job: [id number]

## 2010-09-21 NOTE — Group Therapy Note (Signed)
NAME:  [name], HARPENAU.:  [id number]   MEDICAL RECORD NO.:  [id number]          PATIENT TYPE:  WOC   LOCATION:  [facility]                   FACILITY:  [facility]   PHYSICIAN:  Carolanne Grumbling, M.D.   DATE OF BIRTH:  04-29-1956   DATE OF SERVICE:  02/05/2005                                    CLINIC NOTE   Patient is a 55 year old African-American female here for evaluation of  boils.  Patient reports that she has had trouble with boils since she was  56 months of age.  She has a history of hydradenitis suppurativa mainly in  her upper arms but in the last year she notes that she had more lesions in  her private areas.  In June she developed a hair bump and they have  continued to reoccur.  She is especially worried about them now because she  had Trichomonas and Chlamydia in November secondary to her husband having  affairs.  Her STD evaluation in December 2005 was negative.  Patient reports  that the areas are very painful and will often wake her from sleep.   PAST MEDICAL HISTORY:  1.  Type 2 diabetes on oral hypoglycemics.  She does not check her blood      sugars, is unsure of her last hemoglobin A1c.  2.  Asthma.  3.  Hyperlipidemia.  4.  Hypertension.  5.  Kidney problems.  6.  History of an ulcer.   PAST SURGICAL HISTORY:  1.  Abdominal hysterectomy in 1985.  2.  Breast reduction 1996.  3.  Laparoscope 2004.   MEDICATIONS:  1.  Lotrel 5/20 mg.  2.  Actos 45 mg.  3.  Nexium 40 mg.  4.  Lipitor 20 mg.   ALLERGIES:  No known drug allergies.   PHYSICAL EXAMINATION:  VITAL SIGNS:  Temperature 98.6, blood pressure 164/97  (she did not take her blood pressure medicine today), pulse 95, weight  142.9, height 5 feet 4 inches.  GENERAL:  Well-developed, well-nourished female in no apparent distress.  CARDIOVASCULAR:  Regular rate and rhythm.  LUNGS:  Clear to auscultation.  ABDOMEN:  Soft, nontender, nondistended.  GENITOURINARY:  She does have an  indurated left labia that is very firm to  palpation and tender.  There is no distinct boil or abscess and nothing that  I was able to lance to get a sample from.  Her right labia is within normal  limits.  No signs of herpes.  Dr. Okey Dupre also examined her with me.   IMPRESSION:  1.  Cellulitis of the labia, possible hidradenitis suppurativa.  2.  Hypertension.   PLAN:  Will start long-term antibiotics with minocycline 100 mg one tablet  p.o. b.i.d.  Most likely etiology is Staph epidermis but even if it was MRSA  this would cover bacteria.  Patient was also given prescription for Diflucan  150 mg #1 with one refill as this may cause her to have a yeast infection.  If this does not improve she is to follow up with her primary care physician  and may ultimately need a dermatology consult.  She is to follow up with her  primary care physician about her blood pressure.  It was recommended that  she keep tight control of her diabetes.           ______________________________  Carolanne Grumbling, M.D.     TW/MEDQ  D:  02/05/2005  T:  02/06/2005  Job:  [id number]

## 2010-09-21 NOTE — Discharge Summary (Signed)
   NAME:  [name], [name] NO.:  [id number]   MEDICAL RECORD NO.:  [id number]                   PATIENT TYPE:  INP   LOCATION:  3731                                 FACILITY:  [facility]   PHYSICIAN:  Osvaldo Shipper. Spruill, M.D.             DATE OF BIRTH:  02-24-1956   DATE OF ADMISSION:  12/12/2001  DATE OF DISCHARGE:  12/15/2001                                 DISCHARGE SUMMARY   DISCHARGE DIAGNOSES:  1. Chest pain.  2. Hypertension.  3. Non-insulin-dependent diabetes mellitus.   HISTORY OF PRESENT ILLNESS:  This is a 55 year old female who was seen  initially in the emergency department with complaint of chest tightness and  shortness of breath as well as sweating and numbness in the left arm.  The  patient was evaluated in the emergency department and was noted to have a  non acute EKG; however, the pain was responsive to sublingual nitroglycerin,  and it was the opinion the patient should be admitted to rule out the  possibility of myocardial infarction and/or other cardiac-related problems  as there is a history of cocaine abuse for this particular patient.   HOSPITAL COURSE:  The patient was admitted to the medical service, was  placed on telemetry on a low-cholesterol, low-sodium, 1800 calorie ADA diet.  The patient was treated with aspirin and nitroglycerin and Lovenox by  protocol.  The troponin and CPK enzymes were negative.  The patient had a  Cardiolite study which revealed an ejection fraction of 63% and no areas of  ischemia.  After review of serial electrocardiogram, negative Cardiolite  study, and comparison of old record, it was the opinion at this point the  patient could be discharged home with very close outpatient followup.   DISCHARGE MEDICATIONS:  1. Nexium 40 mg daily.  2. Lopressor 12.5 mg daily.  3. Actos 15 mg daily.  4. Lotrel 5/10 one daily.  5. Lipitor 5 mg a day.   FOLLOW UP:  The patient is to be seen in the office in one  week, sooner if  any changes, problems, or complications.       Ivery Quale, P.A.                       Osvaldo Shipper. Spruill, M.D.    HB/MEDQ  D:  01/19/2002  T:  01/20/2002  Job:  [id number]

## 2010-09-21 NOTE — Op Note (Signed)
NAMEDURENDA, PECHACEK NO.:  [id number]   MEDICAL RECORD NO.:  [id number]          PATIENT TYPE:  AMB   LOCATION:  [facility]                          FACILITY:  [facility]   PHYSICIAN:  Leonie Man, M.D.   DATE OF BIRTH:  Mar 21, 1956   DATE OF PROCEDURE:  03/13/2006  DATE OF DISCHARGE:                                 OPERATIVE REPORT   PREOPERATIVE DIAGNOSIS:  Right breast mass.   POSTOPERATIVE DIAGNOSIS:  Right breast mass, pathology pending.   PROCEDURE:  Excisional biopsy, right breast mass.   SURGEON:  Leonie Man, M.D.   ASSISTANT:  OR nurse.   ANESTHESIA:  General.  [name] is a 55 year old female who is status  post bilateral reduction mammoplasties.  She has a right-sided breast mass  which has been present for about a year and has not changed in any  significant way.  She does have a very strong family history of breast  cancer in that several members of her family have had breast cancer in the  past and she is concerned about the mass.  Her most recent mammogram was  nonrevealing.  The patient comes to the operating room now after the risks  and potential benefits of surgery have been discussed.  All questions  answered, consent obtained.   PROCEDURE:  The patient is positioned supinely.  The right breast prepped  and draped to be included in a sterile operative field.  A mass which was  located approximately the 6 o'clock axis, approximately two fingerbreadths  inferior to the areolar border was approached through a circumareolar  incision and dissection carried down to the mass.  The mass was grasped and  dissected free from the surrounding soft tissue and forwarded for pathologic  evaluation.  Grossly this looks like scarring and fat necrosis.  Hemostasis  obtained with electrocautery.  Breast tissues reapproximated with 2-0 Vicryl  sutures.  Subcutaneous tissues closed with 3-0 Vicryl and skin closed with 5-  0 Monocryl.  Wound was reinforced  with Steri-Strips.  Sterile dressings  applied.  Anesthetic reversed.  The patient removed from the operating room  to the recovery room in stable condition.  She tolerated the procedure well.      Leonie Man, M.D.  Electronically Signed     PB/MEDQ  D:  03/13/2006  T:  03/13/2006  Job:  [id number]   cc:   Kathreen Cosier, M.D.  Osvaldo Shipper. Spruill, M.D.

## 2010-09-21 NOTE — Assessment & Plan Note (Signed)
 [facility]                         GASTROENTEROLOGY OFFICE NOTE   NAME:[name], [name]                        MRN:          [id number]  DATE:06/18/2006                            DOB:          07/07/1955    CHIEF COMPLAINT:  Followup with diarrhea, abdominal pain.   Her C diff toxin was negative, her stool culture was negative, her CBC  was normal, her sed rate was negative.  I have not been able to retrieve  any records of previous colonoscopies, though she has said she has had  them.  She started to feel better for a little while, and now her  diarrhea is back, including nocturnal, urgent, loose stools.  There is  no bleeding.  Lomotil had helped, but not now.  She is on metronidazole  500 mg t.i.d., though, in talking to her, I am not convinced she is  taking it the right way.  See my previous dictation for full details of  recent workup.   Heartburn is a problem as well.   PHYSICAL EXAMINATION:  pulse 84, blood pressure 162/90.  ABDOMEN:  Soft, bowel sounds present.  She has some mild tenderness in  the lower quadrant, there in organomegaly or mass.   ASSESSMENT:  Chronic, recurrent diarrhea, problems with abdominal pain.  It sounds most like irritable bowel syndrome, but inflammatory bowel  disease, microscopic colitis are in the differential.  She is having so  much diarrhea and crampy pain associated with it, she is unable to work  at this time.  She tried to go back to work for a day, but could not  stay at her position.   PLAN:  1. We are going to go ahead with a colonoscopy and random biopsies and      terminal ileum intubation.  2. She is going to need to stay out of work for the time being, for      the next couple of weeks, while we sort this out.  I will take care      of the paperwork for that, for her short term disability.  3. Further plans pending the results and clinical course.     Iva Boop, MD,FACG  Electronically Signed    CEG/MedQ  DD: 06/18/2006  DT: 06/18/2006  Job #: [id number]   cc:   Osvaldo Shipper. Spruill, M.D.

## 2010-09-21 NOTE — Discharge Summary (Signed)
[name], [name] NO.:  [id number]   MEDICAL RECORD NO.:  [id number]          PATIENT TYPE:  INP   LOCATION:  3022                         FACILITY:  [facility]   PHYSICIAN:  Osvaldo Shipper. Spruill, M.D.DATE OF BIRTH:  [birth year]   DATE OF ADMISSION:  07/10/2008  DATE OF DISCHARGE:  07/13/2008                               DISCHARGE SUMMARY   DISCHARGE DIAGNOSES:  1. Transient cerebral ischemia.  2. Skin sensation disturbance  3. Abnormality of gait.  4. Coronary atherosclerosis of the native coronary vessels.  5. Hypertension.  6. Hyperlipidemia.  7. Type 2 diabetes mellitus.   HISTORY:  [name] is a 55 year old patient with a history of  hypertension, hyperlipidemia, coronary artery disease and previous CVA  who presented to the emergency department of [facility] with a  complaint of increasing left facial dysesthesia and uncontrolled  hypertension with a question of recurrent stroke.   It is of note that the patient had been doing well up until the past  week she noted problems with increasing facial numbness more on the left  side than the right of the face.  She also noted some increasing  weakness of the left lower legs.  She became concerned as this was  similar to her previous strokes.  She had some vague dizziness.  She  also experienced a sharp anterior chest wall pain.  She subsequently  came to the emergency department for additional evaluation.  While in  the emergency department, the complete blood count was essentially  normal.  The potassium was slightly low at 3.3.  Chest x-ray revealed no  acute cardiopulmonary abnormality and the CT scan of the brain  demonstrated no acute abnormality as well.  However, it was the opinion  that the patient should be admitted for MRI scan to further evaluate  neurologic changes and to rule out cerebrovascular accident.   HOSPITAL COURSE:  The patient was admitted to the Medical Service.  She  was  continued on aspirin and Plavix.  She received a Neurology  consultation.  After careful evaluation by the Neurology Team, it was  the opinion that this was an exacerbation of the old CVA and that this  would resolve as the blood pressure improved.   The patient underwent carotid Doppler study and there was no ICA  stenosis.  There was some bilateral external carotid artery stenosis and  the vertebral flow was antegrade bilaterally.   The patient had an MRI which did not show a significant new finding and  it was the opinion of the neurology consultant that the patient could be  discharged home and follow up as an outpatient.   On July 13, 2008 the patient was discharged home after having received  maximum benefit from this hospitalization.  Plans were made for very  close outpatient followup.  The patient will have blood pressure  monitored very closely and be monitored for any changes of possible CVA.  At this point, the patient's admitted symptoms were ruled as a transient  ischemic attack.   MEDICATIONS AT DISCHARGE:  1.  Tekturna HCT 150/12.5 one daily.  2. Norvasc one daily.  3. Plavix 75 mg daily.  4. Cymbalta 60 mg daily.  5. Actos 15 mg daily.  6. Casodex 60 mg daily.  7. Crestor 20 mg daily.   The patient advised to stop taking the isosorbide, Protonix and Bystolic  until seen in the office.  We will see the patient in the office in 1  week or sooner if any changes, problems or concerns.      Ivery Quale, P.A.      Osvaldo Shipper. Spruill, M.D.  Electronically Signed    HB/MEDQ  D:  09/07/2008  T:  09/07/2008  Job:  [id number]

## 2010-09-21 NOTE — H&P (Signed)
[place]. [facility]  Patient:    [name], [name]                      MRN: [id number] Adm. 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  HISTORY OF PRESENT ILLNESS:  [name] is a 55 year old woman who presented to my office June 07, 1999 for evaluation of the neck pain and left upper extremity weakness along with facial weakness and what she thinks was a possible stroke.  Stiffness as she described in the neck and numbness in the arm and face on the left side.  She works as a Engineer, drilling.  Speech has been slurred, although she has not had any problems in forming speech and findings words and reading nor in her comprehension since this episode.  PAST MEDICAL HISTORY:  Significant for hypertension, gastrointestinal problems, multiple operations including cholecystectomy, hysterectomy, Nissen fundal plication for hiatal hernia.  She has had worsening indigestion recently and acid reflux.  She takes Prevacid and since the operation the reflux had not been a problem but recently it returned.  MEDICATIONS:  Include Hydrochlorothiazide, Prevacid, Claritin, Nasonex, Alprazolam, Lotrel, Prednisone taper, Glucophage.  ALLERGIES:  She has no known drug allergies.  FAMILY HISTORY:  Mother is 30 and is in fair health.  Father is 44 and is in fair health.  Cancer, hypertension, diabetes, cerebrovascular accidents present in the family history.  Strong family history of cerebral aneurysm. Three maternal aunts and an uncle, all who have had aneurysms.  REVIEW OF SYSTEMS:  Positive for night sweats, eyeglasses, nasal congestion, sinus problems, sinus headaches, sore throat, neck and chest pain, hypertension, asthma, bronchitis, nausea, arm weakness, leg weakness, back pain, neck pain, memory problems, difficulty  with speech, double vision, facial weakness, diabetes, excessive thirst, hormonal problems.  She says she has had some bowel incontinence.  She felt that she had to pass some gas and actually released fecal matter.  This happened on one and only one occasion. She smokes 1/2 pack of cigarettes a day since age 78.  Does not use alcohol. She has no history of substance abuse.  She is 5 feet and 4 inches tall, weighing 153 pounds.  PHYSICAL EXAMINATION:  VITAL SIGNS:  Pulse on exam is 68.  NEUROLOGIC:  She is alert and oriented times four.  Answers all questions appropriately.  Memory, language, attention span and fund of knowledge is normal.  She is well kempt and in no distress.  She has slight weakness in the left face and slight asymmetry at rest.  Improved significantly with activation of the mimetic musculature.  Does not have abnormalities in the forehead musculature.  Good strength in the upper extremities, 5/5 except at the shoulders where she has pain and give way weakness bilaterally.  Weak grip 4+/5 bilaterally.  Lower extremities normal in strength. Rombergs test negative.  She can tandem walk.  She does this slowly but reasonably well. Intact pinprick and proprioception in the upper and lower extremities.  She has mild Hoffmans sign in the right hand.  None on the left.  No clonus. Toes are downgoing.  No plantar stimulation.  No cervical masses or bruits are appreciated.  Lung fields are clear.  Pulses are  good at the wrist and feet bilaterally.  Finger-nose-finger testing: Dysmetria on the right side. Not on the left.  She does not have drift.  MRI shows a large displaced disc on the right side at C5-6, spurring on the left side at C5-6 closing off the neuroforamin slightly.  The rest of the cervical spine looks good.  No abnormal signal in the cord.  There is cord distortion at C5-6.  No abnormalities in the paraspinal soft tissue.  MRA and MRI showed no evidence of an  infarction.  She also had no evidence of cerebral aneurysms.  DIAGNOSIS:  Cervical displaced disc, cervical radiculopathy C6 on the right side.  PLAN:  Mrs. Balsley has agreed to undergo cervical diskectomy and fusion. Risks of the procedure including bleeding, infection, no pain relief, bowel or bladder dysfunction, worsening of pain were discussed.  She understands and wishes to proceed.  She also understands that we could do conservative measures for the neck; however, considering the large size of the disc and the fact that she has had this pain for some time, leaves me to believe that surgical treatment is her best option. She has agreed and will proceed with the procedure. DD:  07/09/00 TD:  07/09/00 Job: [id number]

## 2010-09-21 NOTE — Assessment & Plan Note (Signed)
[facility]                         GASTROENTEROLOGY OFFICE NOTE   NAME:[name], [name]                        MRN:          [id number]  DATE:07/16/2006                            DOB:          Sep 09, 1955    CHIEF COMPLAINT:  Follow up with diarrhea and abdominal pain.   Her colonoscopy was normal with random biopsies. Terminal ileum was  normal as well. I have told that the diagnosis really is irritable bowel  syndrome. She is better on Colestid 2 grams twice daily, occasional  Lomotil is used. She is still under a lot of stress dealing with her  mother's death and estate. I think that is part of it. Her abdominal  pain is much better and is about half of what it was. She is back to  work and that is going well.   PAST MEDICAL HISTORY:  Reviewed and unchanged.   PHYSICAL EXAMINATION:  VITAL SIGNS: Weight 149, pulse 88, blood pressure  160/100.   ASSESSMENT:  Irritable bowel syndrome, diarrhea predominant, improved.   PLAN:  Continue current therapy. She will discuss with Dr. Shana Chute other  medications outside of Xanax, perhaps to help with her stress. She was  congratulated on coming up on 12 years of drug free. She continues with  her therapist and her meetings. Hopefully, when the stress of her  mother's death and dealing with the estate resolves, her irritable bowel  would get even better.   Note, she will follow up with me as needed at this point.     Iva Boop, MD,FACG  Electronically Signed    CEG/MedQ  DD: 07/16/2006  DT: 07/17/2006  Job #: [id number]   cc:   Osvaldo Shipper. Spruill, M.D.

## 2010-09-21 NOTE — Discharge Summary (Signed)
[place]. [facility]  Patient:    [name], [name] Visit Number: [id number] MRN: [id number]          Service Type: MED Location: 2000 2031 01 Attending Physician:  Robynn Pane Dictated by:   Eduardo Osier Sharyn Lull, M.D. Admit Date:  04/02/2001 Disc. Date: 04/04/01   CC:         Osvaldo Shipper. Spruill, M.D.   Discharge Summary  ADMISSION DIAGNOSES:  1. New onset angina, rule out coronary insufficiency.  2. Questionable history of old silent myocardial infarction.  3. Hypertension.  4. Non-insulin dependent diabetes mellitus.  5. History of tobacco abuse.  6. History of cocaine abuse in the past.  7. Surgical postmenopause.  8. Positive family history of coronary artery disease.  FINAL DIAGNOSES:  1. Stable angina, negative stress Cardiolite.  2. History of silent myocardial infarction in the past.  3. Hypertension.  4. Non-insulin dependent diabetes mellitus.  5. Tobacco abuse.  6. History of cocaine abuse.  7. Postmenopausal.  8. Positive family history of coronary artery disease.  9. Hypercholesterolemia. 10. History of hiatus hernia.  DISCHARGE MEDICATIONS:  1. Actos 15 mg 1 tablet daily.  2. Lotrel 5/20 mg 1 tablet daily.  3. Prevacid 30 mg 1 capsule twice daily.  4. Xanax 0.5 mg 1 tablet twice daily as before.  5. Flonase and Allegra as needed as before.  BRIEF HISTORY: The patient is a 55 year old black female with a past medical history significant for hypertension, non-insulin dependent diabetes mellitus, tobacco abuse, history of cocaine abuse, postmenopausal. She came to the ER via EMS complaining of retrosternal chest tightness associated with diaphoresis and mild shortness of breath off and on which started last night, which lasted for a few minutes and resolved with rest, and again this afternoon had similar tightness, called EMS, received 3 sublingual nitroglycerin with partial relief from grade 8/10 to grade 5/10 and was started  on IV nitroglycerin with relief of chest tightness in the ER. Also, complains of musculoskeletal right-sided chest wall pain which increases with movement. Denies any PND, orthopnea, leg swelling, denies palpitations, lightheadedness, or syncope. Denies fever, chills, cough. Denies such episodes of chest pain in the past.  PAST MEDICAL HISTORY: As above.  PAST SURGICAL HISTORY: She had multiple surgeries in the past. She had sinus surgery in August of 2002. She had disk surgery secondary to cervical radiculopathy in March of 2002. She had breast reduction in 1997, hysterectomy in 1985.  She had hiatus hernia repair in 1990s. She had bilateral foot surgery for bunions in 1998. She had heart adenitis in 1990s.  SOCIAL HISTORY: She is single, born in [place], [place], moved here in 1990s. 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 Smokes one-pack per day for 30+ years.  Snorted cocaine and sniffed crack for 1-2 years in the past, quit 7 years ago. No history of alcohol abuse.  FAMILY HISTORY: Father is 36. He has congestive heart failure.  He is diabetic, had stroke. Mother had MI and subsequently required coronary artery bypass grafting in her 36s. One sister and aneurysm of the brain. She was hypertensive.  HOME MEDICATIONS: She was on: 1. Actos 15 mg p.o. q.d. 2. Lotrel 5/20 mg p.o. q.d. 3. Xanax 1 mg p.o. h.s. 4. Prevacid 30 mg q.d. 5. Flonase and Allegra on p.r.n. basis.  PHYSICAL EXAMINATION:  GENERAL: On examination, she was awake, alert, and oriented x3 in no acute distress.  VITAL SIGNS: Blood pressure was 106/68, pulse was  70, regular.  HEENT: Conjunctivae were pink.  NECK: Supple. No JVD. No bruit.  LUNGS: Clear to auscultation without rhonchi or rales.  CARDIOVASCULAR: S1, S2 was normal. There was no S3 or S4 gallop.  ABDOMEN: Soft, bowel sounds are present, nontender.  EXTREMITIES: There was no clubbing, cyanosis, or edema.  LABORATORY DATA: ECG  showed normal sinus rhythm with old septal wall MI, age undetermined. There were no acute ischemic changes noted. O2 saturations, CPK and troponin I were negative. Cholesterol was 198, HDL was 36, LDL was 124. Sodium was 142, potassium 4.5, chloride 108, glucose was 97, BUN was 10, creatinine 0.6. Her hemoglobin was 13.9, hematocrit 40.7, white count of 5.8 with no shift to the left. Hemoglobin A1c was 6.1.  HOSPITAL COURSE: The patient was admitted to telemetry unit, MI was ruled out by serial enzymes and ECG. The patient underwent stress Cardiolite in November 29 and exercised for 8.15 minutes on Bruce protocol, complained of fatigue, shortness of breath, and vague chest pain. Peak heart rate achieved was 128, peak blood pressure achieved was 150/88. ECG showed normal sinus rhythm with old septal wall MI at baseline. There was less than 1 mg ST depression noted in inferolateral leads, especially in recovery phase. No arrhythmias were noted. Cardiolite scan showed possible scarring in the anteroseptal wall with no evidence of reversible ischemia. Ejection fraction of 61%.  The patient had one episode of vague chest pain during the hospital stay and there were no further episodes of anginal pain. Stress test was basically negative with no evidence of ischemia. The patient will be discharged home on the above medications and will be followed up by Dr. Shana Chute in 1 week. Dictated by:   Eduardo Osier Sharyn Lull, M.D. Attending Physician:  Robynn Pane DD:  04/04/01 TD:  04/04/01 Job: [id number]

## 2010-09-21 NOTE — Op Note (Signed)
NAME:  [name], [name] NO.:  [id number]   MEDICAL RECORD NO.:  [id number]                   PATIENT TYPE:  AMB   LOCATION:  SDC                                  FACILITY:  [facility]   PHYSICIAN:  Ilda Mori, M.D.                DATE OF BIRTH:  1956/05/06   DATE OF PROCEDURE:  12/27/2002  DATE OF DISCHARGE:                                 OPERATIVE REPORT   PREOPERATIVE DIAGNOSIS:  Chronic abdominal pelvic pain.   POSTOPERATIVE DIAGNOSIS:  Chronic abdominal pelvic pain.   PROCEDURE:  Diagnostic laparoscopy, cautery of endometriotic implant.   SURGEON:  Ilda Mori, M.D.   ANESTHESIA:  General endotracheal anesthesia.   ESTIMATED BLOOD LOSS:  20 mL.   FINDINGS:  The bowel were without adhesions.  The cecum and right lower  abdomen were free of adhesions or scar tissue.  The liver edge was smooth  without adhesions or inflammation.  There was no inflammation or adhesions  in the bowel.  There was a small vail of filmy omental adhesions at the  umbilicus.   INDICATIONS FOR PROCEDURE:  This is a 55 year old gravida 3, para 1 female  with chronic lower abdominal pain for several years.  The patient is status  post total abdominal hysterectomy and bilateral salpingo-oophorectomy for  probable endometriosis 19 years ago.  The patient has been followed by  gastroenterologist who has found no etiology for her chronic lower abdominal  pain which has become worse over the last several weeks and months.  Because  of the severity of the pain and the fact that she was on pain medication to  treat it, the decision was made to proceed with diagnostic laparoscopy to  look for causes of infection including adhesions, ovarian remnant syndrome  or persistent endometriosis.   DESCRIPTION OF PROCEDURE:  The patient was taken to the operating room and  placed in the supine position where general endotracheal anesthesia was  induced.  She was then placed in the  dorsal lithotomy position and the  abdomen, perineum and vagina were prepped and draped in a sterile fashion.  A supraumbilical transverse incision was made through the previous umbilical  scar that had been performed for her laparoscopic cholecystectomy.  This  incision was carried down to the fascia which was incised and the peritoneum  was then entered bluntly.  An 0 Vicryl pursestring suture was placed in the  fascia.  The Hasson cannula was introduced into the peritoneal cavity and  laparoscope was introduced and pneumoperitoneum was created.  Under direct  visualization, accessory instruments placed through suprapubic stab wound.  The pelvis and peritoneal cavity were viewed in its entirety with the aid of  probe. There was no pathology found except for a small area of possible  endometriosis in the right cul-de-sac.  This area was cauterized with  bipolar cautery.  The ureters were identified bilaterally and the  right  ureter was well lateral to the area of cautery for the endometriotic  implant.  The procedure was then terminated.  The gas was allowed to  escape.  The Hasson cannula was removed and the pursestring Vicryl suture  was tied to close the fascia.  A subcuticular 4-0 Dexon suture was used to  close the skin at the umbilical site and at the suprapubic stab wound site.  The patient tolerated the procedure well and left the operating room in good  condition.                                               Ilda Mori, M.D.    RK/MEDQ  D:  12/27/2002  T:  12/27/2002  Job:  [id number]

## 2010-09-21 NOTE — Op Note (Signed)
. [facility]  Patient:    [name], [name]                        MRN: [id number] Proc. Date: 07/09/00 Attending:  Lucky Cowboy, M.D. CC:         Mena Goes. Franky Macho, M.D.   Operative Report  PREOPERATIVE DIAGNOSIS:  Tongue base mass, bilateral.  POSTOPERATIVE DIAGNOSIS:  Tongue base mass, bilateral.  OPERATION PERFORMED:  Direct laryngoscopy with bilateral tongue base biopsies.  SURGEON:  Lucky Cowboy, M.D.  ANESTHESIA:  General endotracheal.  ESTIMATED BLOOD LOSS:  5 cc.  SPECIMENS:  Bilateral tongue base biopsies.  COMPLICATIONS:  None.  INDICATIONS FOR PROCEDURE:  The patient is a 55 year old female who was in the operating room for anterior cervical surgery by Dr. Coletta Memos.  During intubation, two large masses were identified.  One large mass on the left side and two pedunculated masses on the right tongue base region.  For this reason, intraoperative consultation was obtained.  FINDINGS:  The patient was noted to have two less than 1 cm pedunculated masses extending from the right tongue base close to the midline.  There was a more extensive and complex mass extending from the left inferior tonsillar pole with extension onto the tongue base.  This was approximately 2 cm and firm with somewhat of a pale yellow appearance.  Biopsies were taken from both of these sites.  DESCRIPTION OF PROCEDURE:  The patient was noted on the operating table that was previously intubated.  The neck was gently extended.  A Crowe-Davis mouth gag with a #4 tongue blade was then placed intraorally, opened and suspended on the Mayo stand.  Inspection of the oropharynx was then performed with the findings as noted above.  At this point the mouth gag was removed and the direct laryngoscope using the Dedo laryngoscope to visualize the oropharynx, hypopharynx was performed.  There was a moderate amount of bilateral vocal cord edema without obstruction.  There was  diffuse mild edema of the epiglottis.  At this point the mouth gag was replaced and suspended on a Mayo stand.  Cup forceps were used to biopsy both of the sites.  One at the left tongue base and one at the right.  These were sent for permanent section. Suction cautery was used to control the small amount of bleeding at the left tongue base.  The mouth gag was removed, noting no damage to the teeth or soft tissues.  The patient was awakened from anesthesia and extubated in the operating room.  She was taken to the post anesthesia care unit in stable condition.  There were no complications. DD:  07/09/00 TD:  07/10/00 Job: [id number]

## 2010-09-21 NOTE — Consult Note (Signed)
[place]. [facility]  Patient:    [name], [name]                      MRN: [id number] Proc. Date: 07/13/00 Adm. Date:  [id number] Attending:  Cathren Laine                          Consultation Report  HISTORY OF PRESENT ILLNESS:  The patient is a 55 year old right-handed black female who is four days status post at C5-6 anterior cervical diskectomy and arthrodesis by Dr. Coletta Memos.  She called last night because she had developed some swelling beneath her incision yesterday.  She has had no fever. She does note that she had some mild dysphagia but she has had no difficulty breathing and overall the swelling is decreased somewhat today as compared to yesterday.  She does note some mild discomfort in the proximal aspect of her right upper extremity but her review of systems is otherwise unremarkable.  PHYSICAL EXAMINATION:  GENERAL:  The patient is a well-developed, well-nourished black female in no acute distress.  VITAL SIGNS:  Temperature 98.6, pulse 73, blood pressure 159/97, respiratory rate 18.  Oxygen saturation on room air is 98%.  NECK:  Some mild swelling beneath the incision.  It does feel indurated but is nontender.  There is no erythema.  There is mild ecchymosis above and below the incision.  There is no purulence and the incision overall is healing well.  DIAGNOSTIC STUDIES:  Lateral cervical spine x-rays shows a typical prevertebral swelling following an anterior cervical diskectomy and arthrodesis.  The graft and hardware are in good position.  The overall alignment of the cervical spine is good.  IMPRESSION:  Superficial postop wound hematoma.  RECOMMENDATIONS:  Recommended to the patient that she resume her usual postop activities, that she return to the emergency room if swelling worsens or if dyspnea develops, and that she return to the office to see Dr. Franky Macho within 2-3 days.  She is to call tomorrow for that  appointment. DD:  07/13/00 TD:  07/14/00 Job: [id number]

## 2010-09-21 NOTE — Cardiovascular Report (Signed)
NAME:  [name], [name] NO.:  [id number]   MEDICAL RECORD NO.:  [id number]                   PATIENT TYPE:  OIB   LOCATION:  2856                                 FACILITY:  [facility]   PHYSICIAN:  Mohan N. Sharyn Lull, M.D.              DATE OF BIRTH:  1956/05/01   DATE OF PROCEDURE:  08/10/2002  DATE OF DISCHARGE:                              CARDIAC CATHETERIZATION   PROCEDURE:  Left cardiac catheterization with selective right and left  coronary angiography, left ventriculography the right groin using Judkins  technique.   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  She complains of retrosternal chest pain that  radiates into the neck and right arm off and on associated with mild  shortness of breath, lasting a few minutes, relieved with sublingual  nitroglycerin and rest.  The patient denies any PND, orthopnea, or leg  swelling.  Denies palpitations, lightheadedness, or syncope.  The patient  had multiple Persantine and Cardiolite studies in the past which were  positive by minor EKG changes.  The Cardiolite scans were negative for  reversible ischemia.  Due to multiple risk factors and typical anginal chest  pain, and minor EKG changes on Cardiolite, the patient was referred for left  catheterization and possible PTCA.   PAST MEDICAL HISTORY:  As above.   PAST SURGICAL HISTORY:  She had sinus surgery in August of 2002.  She had  cervical disk surgery in March of 2002, breast reduction surgery in 1997,  hysterectomy in 1985, hiatal hernia repaired in 1990, bilateral foot surgery  in 1998.   SOCIAL HISTORY:  She is single, born in [place], [place], moved here  in  1990.  Worked for a very large tobacco company.  Smoked one pack per day  for 30+ years.  Now smokes 1/2 to 3/4 pack per day.  She has snorted cocaine  and sniffed crack for 1-2 years in the past about 8 years ago.  No history  of alcohol abuse.   FAMILY HISTORY:  Father has coronary artery disease, congestive heart  failure, diabetes, and also a history of CVA.  Mother had MI.  She had  surgery for coronary artery bypass grafting.  One sister has aneurysm of the  brain.  She is hypertensive.   HOME MEDICATIONS:  1. She is on Lotrel 5/20 mg one p.o. every day.  2. Lipitor 10 mg p.o. every day.  3. Enteric-coated aspirin 325 mg p.o. every day.  4. Prevacid 30 mg p.o. every day.  5. Xanax 0.25 mg p.o. b.i.d.  6. Actos 30 mg p.o. every day.  7. Skelaxin 800 mg t.i.d.  8. Levbid 0.125 mg p.o. t.i.d.  PHYSICAL EXAMINATION:  GENERAL:  On examination, she is awake, alert and  oriented x3 in no acute distress.  VITAL SIGNS:  Blood pressure is 120/70, pulse of 74, regular.  HEENT:  Conjunctiva pink.  NECK:  Supple, no JVD, no bruit.  LUNGS:  Clear to auscultation without rhonchi or rales.  CARDIOVASCULAR:  S1 and S2 are normal.  There is a soft systolic murmur at  apex.  There was no S3 gallop.  ABDOMEN:  Soft, bowel sounds are present, nontender.  EXTREMITY:  There is no clubbing, cyanosis or edema.   NOTE:  Discussed at length regarding left catheterization, possible PTCA and  stenting its risks, i.e., death, MI, stroke, need for emergency CABG, risk  of restenosis, local vascular complications, etc., and consented for PCI.   DESCRIPTION OF PROCEDURE:  After obtaining the informed consent, the patient  was brought to the catheterization lab and was placed on the fluoroscopy  table.  The right groin was prepped and draped in the usual fashion.  Two  percent Xylocaine was used for local anesthesia in the right groin.  The JL4  thin-wall needle and 6-French arterial sheath was placed.   The sheath was  aspirated and flushed.   Next a 6-French left Judkins catheter was advanced over the wire under  fluoroscopic guidance up to the ascending aorta.  The wire was pulled out.  The catheter was aspirated and connected to the manifold.  The catheter was  further advanced and engaged into the left coronary ostium.  Multiple views  of the left system were taken.   Next, the catheter was disengaged and was pulled out over the wire, and was  replaced with a 6-French right Judkins catheter which was advanced over the  wire under fluoroscopic guidance up to the ascending aorta.  The wire was  pulled out.  The catheter was aspirated and connected to the manifold.  The  catheter was further advanced and engaged into the right coronary ostium.  Multiple views of the right system were taken.  Next, the catheter was  disengaged and was pulled out over the wire, and was replaced with a 6-  French pigtail catheter, which was advanced over the wire under fluoroscopic  guidance, up to the ascending aorta.  The wire was pulled out.  The catheter  was aspirated and connected to the manifold.  The catheter was further  advanced across the aortic valve into the LV.  LV pressures were recorded.  Next, left ventriculogram was done in the 30-degree RAO position.  Post  angiographic pressures were recorded from the LV and then pullback pressures  were recorded from the aorta.  There was no significant gradient across the  aortic valve.  Next, the pigtail catheter was pulled out over the wire.  Sheaths were aspirated and flushed.   FINDINGS:  Left Ventricle:  The LV showed good LV systolic function.  Ejection fraction of 55% to 60%.   Left Main:  Appears to be patent.   Left Anterior Descending:  The LAD has 10% to 20% ostial stenosis.  The  diagonal #1 is very small, which is patent.  Diagonal #2 is small which is  patent.  Left Circumflex:  The left circumflex is small with staples down in  the AV  groove after giving off OM2.  OM1 is medium sized which is patent.  OM2 is  small which is patent.   Right Coronary Artery:  The RCA has 20% to 30% proximal focal  stenosis  distally.  The vessel is less than 1 mm in size, which is diffusely  diseased.  All of the vessels appear to be small, typical of diabetes.    DISPOSITION:  The patient tolerated the procedure well.  There were no  complications.  The patient was transferred to the recovery room in stable  condition.                                                   Eduardo Osier. Sharyn Lull, M.D.    MNH/MEDQ  D:  08/10/2002  T:  08/11/2002  Job:  [id number]   cc:   Cath Lab   Osvaldo Shipper. Spruill, M.D.  [address] [id number]  [place]  [place] [address]  Fax: [phone]

## 2010-09-21 NOTE — Discharge Summary (Signed)
[name], [name] NO.:  [id number]   MEDICAL RECORD NO.:  [id number]          PATIENT TYPE:  INP   LOCATION:  5019                         FACILITY:  [facility]   PHYSICIAN:  Coletta Memos, M.D.     DATE OF BIRTH:  [birth year]   DATE OF ADMISSION:  12/08/2006  DATE OF DISCHARGE:  12/10/2006                               DISCHARGE SUMMARY   ADMITTING DIAGNOSIS:  Far lateral disk herniation L4-5 left side.   DISCHARGE DIAGNOSES:  1. Far lateral disk herniation L4-5 left side.  2. Left L4 radiculopathy.   COMPLICATIONS:  None.   DISCHARGE STATUS:  Alive and well.   INDICATIONS:  The patient presented with significant pain in the left  lower extremity.  MRI showed a left L4-5 far lateral disk herniation.  I  therefore offered and she chose to have operative decompression.   She was brought to the operating room and underwent an uncomplicated  procedure.  Postoperatively, she did well, having some pain with  weightbearing which did improve.  She was voiding and tolerating a  regular diet at discharge.  Wound was clean, dry and no signs of  infection.           ______________________________  Coletta Memos, M.D.     KC/MEDQ  D:  01/09/2007  T:  01/09/2007  Job:  [id number]

## 2010-09-21 NOTE — Cardiovascular Report (Signed)
[name], [name] NO.:  [id number]   MEDICAL RECORD NO.:  [id number]          PATIENT TYPE:  OIB   LOCATION:  2899                         FACILITY:  [facility]   PHYSICIAN:  Ricki Rodriguez, M.D.  DATE OF BIRTH:  1955/05/19   DATE OF PROCEDURE:  10/04/2005  DATE OF DISCHARGE:                              CARDIAC CATHETERIZATION   PROCEDURE:  1.  Left heart catheterization.  2.  Selective coronary angiography.  3.  Left ventricular function study.   INDICATION:  This 55 year old black female has recurrent chest pain along  with cardiac risk factors of hypertension, smoking and known coronary artery  disease in the past.   APPROACH:  Right femoral artery using 4-French sheath and catheters.   COMPLICATIONS:  None, but a Smart needle used for vascular access.   Less than 45 mL of the dye was used.   HEMODYNAMIC DATA:  The left ventricular pressure was 140/5/16 and aortic  pressure was 134/80.   CORONARY ANATOMY:  The left main coronary artery was unremarkable.   Left anterior descending coronary artery:  The left anterior descending  coronary artery showed proximal calcification with mild luminal  irregularities and mild mid-vessel concentric narrowing.  The rest of the  vessel was unremarkable and wrapped around the apex of the heart.  The  diagonal 1 and 3 were small vessels and diagonal 2 was unremarkable.   Left circumflex coronary artery:  The left circumflex coronary artery was  essentially unremarkable with a large obtuse marginal branch and a  relatively smaller obtuse marginal branch 2.   Right coronary artery:  The right coronary artery was dominant with a  proximal gradual tapering leading to 30% lesion and mid-vessel 20% to 30%  lesions x2.  The posterolateral branch was unremarkable and the posterior  descending coronary artery was a narrowing vessel without visible disease.   LEFT VENTRICULOGRAM:  The left ventriculogram showed normal left  ventricular  systolic function with ejection fraction of 65%.   IMPRESSION:  1.  Mild multivessel native vessel coronary artery disease.  2.  Normal left ventricular systolic function.   RECOMMENDATION:  This patient will be treated medically with therapeutic  lifestyle changes of smoking cessation, increasing Caduet to 5/40.      Ricki Rodriguez, M.D.  Electronically Signed     ASK/MEDQ  D:  10/04/2005  T:  10/04/2005  Job:  [id number]

## 2010-11-26 ENCOUNTER — Ambulatory Visit (INDEPENDENT_AMBULATORY_CARE_PROVIDER_SITE_OTHER): Payer: Self-pay | Admitting: Surgery

## 2010-12-03 ENCOUNTER — Encounter (INDEPENDENT_AMBULATORY_CARE_PROVIDER_SITE_OTHER): Payer: Self-pay | Admitting: Surgery

## 2010-12-04 ENCOUNTER — Ambulatory Visit (INDEPENDENT_AMBULATORY_CARE_PROVIDER_SITE_OTHER): Payer: Self-pay | Admitting: Surgery

## 2010-12-12 ENCOUNTER — Encounter (INDEPENDENT_AMBULATORY_CARE_PROVIDER_SITE_OTHER): Payer: Self-pay | Admitting: Surgery

## 2010-12-14 ENCOUNTER — Ambulatory Visit (INDEPENDENT_AMBULATORY_CARE_PROVIDER_SITE_OTHER): Payer: Medicare Other | Admitting: Surgery

## 2010-12-14 ENCOUNTER — Encounter (INDEPENDENT_AMBULATORY_CARE_PROVIDER_SITE_OTHER): Payer: Self-pay | Admitting: Surgery

## 2010-12-14 VITALS — BP 148/90 | HR 92 | Temp 97.0°F | Ht 64.0 in | Wt 144.0 lb

## 2010-12-14 DIAGNOSIS — L732 Hidradenitis suppurativa: Secondary | ICD-10-CM

## 2010-12-14 NOTE — Patient Instructions (Signed)
Try to quit smoking.  Soak in bathtub to try to bring infections to a head.

## 2010-12-14 NOTE — Progress Notes (Addendum)
Chief Complaint  Patient presents with  . Other    Hidradenitis groin    [name] is a 55 y.o. AA female who is a patient of Jennifer Corn, MD ([name] is seeing [name] in [name] absence.) and comes to me today for groin hidradinitis.  She is an old patient of [name] who was operated on both right axillas for hidradenitis. I do not have [name] op note for these procedures. She has done well with these axillary excisions.  She's had increasing trouble with hidradenitis in her groin pubic area. Today she has one area to the left of midline over her mons. She has a second area in her left lower quadrant.  Both these areas are tender and inflamed.  The mons area abscess is draining.   Past Medical History  Diagnosis Date  . Diabetes mellitus   . Hypertension   . Stroke   . Anxiety     No past surgical history on file.  Current Outpatient Prescriptions  Medication Sig Dispense Refill  . Aliskiren-Hydrochlorothiazide (TEKTURNA HCT) 150-12.5 MG TABS Take 1 tablet by mouth daily.        . clopidogrel (PLAVIX) 75 MG tablet Take 75 mg by mouth daily.        . pioglitazone (ACTOS) 15 MG tablet Take 15 mg by mouth daily.          Allergies  Allergen Reactions  . Iodine Swelling   Review of Systems: Skin:  Hidradenitis of groin. Infection:  No history of hepatitis or HIV.  No history of MRSA. See "skin". Neurologic:  Stroke in March 2009, March 16109, and in 2011. Included blurry vision, numbness, and changes in speech. Still has decreased vision on left side.  No history of seizure.  Migraine headaches. Cardiac:  Hypertension for 20 + years. MI in 2003.  No history of prior cardiac catheterization.  Sees [name] as her cardiologist. Pulmonary:  Smoke cigarettes.  She knows it contributes to her stroke risk, heart disease, and skin disease.  Endocrine: Diabetes for 10 years.  Hypercholesterolemia. No thyroid disease. Gastrointestinal:   History of HH and reflux.  No history of liver disease.  Prior cholecytectomy.  No history of pancreas disease.  No history of colon disease. Urologic:  No history of kidney stones.  No history of bladder infections. Musculoskeletal:  No history of joint or back disease. Hematologic:  No bleeding disorder.  No history of anemia.  Not anticoagulated.   SOCIAL and FAMILY HISTORY:  PHYSICAL EXAM: BP 148/90  Pulse 92  Temp(Src) 97 F (36.1 C) (Temporal)  Ht 5\' 4"  (1.626 m)  Wt 144 lb (65.318 kg)  BMI 24.72 kg/m2  HEENT: Normal. Pupils equal. Normal dentition. Neck: Supple. No thyroid mass. Lymph Nodes:  No supraclavicular or cervical nodes. Axilla:  Scars in both axilla from previous excision. Lungs: Clear and symmetric. Heart:  RRR. No murmur.  Abdomen: Tender mass 2 cm LLQ.  Tender 3 cm area in mons to left of midline.  These are two distinct areas of infection.  She also complained about the lateral area of her vagina and in her groins.  I did not see any significant infection in this area today.  Extremities:  Good strength in upper and lower extremities. Neurologic:  Grossly intact to motor and sensory function.  ASSESSMENT and PLAN: 1.  Hidradenitis of groin and LLQ. The patient has two areas that will need excision or I&D.  She  will not let me I&D these areas in the office.  She says they are too painful.  So we will set up surgery to excise or I&D these areas at the hospital.  I talked about the indications and risks of surgery.  The patient has an unrealistic view that these will magically heal with surgery.  I discussed the limits of surgery and the risk of recurrence.  I talked to her about things she can control which may help these infections:  Stop smoking and control DM well.  But there are significant limits to what I can excise, particularly if the areas are acutely inflamed.  She comes from [place].  I also told her to talk to her Gyn doctor about these areas.  She  can't remember her Gyn doctor's name?? I gave her literature about hidradenitis.  #2.  Smokes cigarettes.  Knows it is bad for her health. #3. Diabetes mellitus.  (preop blood sugar = 311 !!) #4. History of strokes. March 2009, March 2010, and 2011. #5. Hypertension. #6. Coronary artery disease.  She will need cardiac clearance from [name] prior to surgery.  One question will be can we stop the Plavix/aspirin.  #7. Hypercholesterolemia. #8. Irritable bowel syndrome. #9. Migraine headaches. #10. Transfusions after hysterectomy in 1985. #11. History of back surgery twice. Last surgery in 2008.  Saw [name]. #12.  She will need to stop Plavix/Aspirin pre op.  By what she says, she takes these meds sporadically anyway.

## 2011-01-09 ENCOUNTER — Encounter (HOSPITAL_BASED_OUTPATIENT_CLINIC_OR_DEPARTMENT_OTHER)
Admission: RE | Admit: 2011-01-09 | Discharge: 2011-01-09 | Disposition: A | Discharge status: Home / Self Care | Payer: Medicare Other | Source: Ambulatory Visit | Attending: Surgery | Admitting: Surgery

## 2011-01-09 LAB — BASIC METABOLIC PANEL
BUN: 10 mg/dL (ref 6–23)
CO2: 23 mEq/L (ref 19–32)
Calcium: 9.4 mg/dL (ref 8.4–10.5)
Chloride: 100 mEq/L (ref 96–112)
Creatinine, Ser: 0.68 mg/dL (ref 0.50–1.10)
GFR calc Af Amer: >60 mL/min (ref >60)
GFR calc non Af Amer: >60 mL/min (ref >60)
Glucose, Bld: 311 mg/dL — ABNORMAL HIGH (ref 70–99)
Potassium: 3.7 mEq/L (ref 3.5–5.1)
Sodium: 136 mEq/L (ref 135–145)

## 2011-01-10 NOTE — Progress Notes (Signed)
Quick Note:  These labs are OK for surgery. ______ 

## 2011-01-11 ENCOUNTER — Ambulatory Visit (HOSPITAL_BASED_OUTPATIENT_CLINIC_OR_DEPARTMENT_OTHER)
Admission: RE | Admit: 2011-01-11 | Discharge: 2011-01-11 | Disposition: A | Discharge status: Home / Self Care | Payer: Medicare Other | Source: Ambulatory Visit | Attending: Surgery | Admitting: Surgery

## 2011-01-11 ENCOUNTER — Other Ambulatory Visit (INDEPENDENT_AMBULATORY_CARE_PROVIDER_SITE_OTHER): Payer: Self-pay | Admitting: Surgery

## 2011-01-11 DIAGNOSIS — K219 Gastro-esophageal reflux disease without esophagitis: Secondary | ICD-10-CM | POA: Insufficient documentation

## 2011-01-11 DIAGNOSIS — Z01810 Encounter for preprocedural cardiovascular examination: Secondary | ICD-10-CM | POA: Insufficient documentation

## 2011-01-11 DIAGNOSIS — Z01812 Encounter for preprocedural laboratory examination: Secondary | ICD-10-CM | POA: Insufficient documentation

## 2011-01-11 DIAGNOSIS — E119 Type 2 diabetes mellitus without complications: Secondary | ICD-10-CM | POA: Insufficient documentation

## 2011-01-11 DIAGNOSIS — I10 Essential (primary) hypertension: Secondary | ICD-10-CM | POA: Insufficient documentation

## 2011-01-11 DIAGNOSIS — L03319 Cellulitis of trunk, unspecified: Secondary | ICD-10-CM

## 2011-01-11 DIAGNOSIS — F17200 Nicotine dependence, unspecified, uncomplicated: Secondary | ICD-10-CM | POA: Insufficient documentation

## 2011-01-11 DIAGNOSIS — L732 Hidradenitis suppurativa: Secondary | ICD-10-CM | POA: Insufficient documentation

## 2011-01-11 DIAGNOSIS — L02219 Cutaneous abscess of trunk, unspecified: Secondary | ICD-10-CM

## 2011-01-11 LAB — GLUCOSE, CAPILLARY
Glucose-Capillary: 163 mg/dL — ABNORMAL HIGH (ref 70–99)
Glucose-Capillary: 115 mg/dL — ABNORMAL HIGH (ref 70–99)

## 2011-01-11 LAB — POCT HEMOGLOBIN-HEMACUE: Hemoglobin: 14.7 g/dL (ref 12.0–15.0)

## 2011-01-14 ENCOUNTER — Telehealth (INDEPENDENT_AMBULATORY_CARE_PROVIDER_SITE_OTHER): Payer: Self-pay | Admitting: Surgery

## 2011-01-15 NOTE — Telephone Encounter (Signed)
I called pt and gave her a postop appointment for Thursday am

## 2011-01-16 ENCOUNTER — Encounter (INDEPENDENT_AMBULATORY_CARE_PROVIDER_SITE_OTHER): Payer: Self-pay

## 2011-01-17 ENCOUNTER — Encounter (INDEPENDENT_AMBULATORY_CARE_PROVIDER_SITE_OTHER): Payer: Medicare Other | Admitting: Surgery

## 2011-01-18 ENCOUNTER — Ambulatory Visit (INDEPENDENT_AMBULATORY_CARE_PROVIDER_SITE_OTHER): Payer: Medicare Other | Admitting: Surgery

## 2011-01-18 ENCOUNTER — Encounter (INDEPENDENT_AMBULATORY_CARE_PROVIDER_SITE_OTHER): Payer: Self-pay | Admitting: Surgery

## 2011-01-18 VITALS — BP 126/80 | HR 80

## 2011-01-18 DIAGNOSIS — L732 Hidradenitis suppurativa: Secondary | ICD-10-CM

## 2011-01-18 NOTE — Progress Notes (Signed)
ASSESSMENT and PLAN:  1. Hidradenitis of groin and LLQ.   Areas excised and drained 01/11/2011  I gave her a copy of the path report.  I gave her a prescription for Ibuprofen, 800 mg, #30, with one refill.  Return appointment prn.     2. Smokes cigarettes. Knows it is bad for her health.  3. Diabetes mellitus. (preop blood sugar = 311 !!)  4. History of strokes. March 2009, March 2010, and 2011.  5. Hypertension.  6. Coronary artery disease.     7. Hypercholesterolemia.  8. Irritable bowel syndrome.  9. Migraine headaches.  10. Transfusions after hysterectomy in 1985.  11. History of back surgery twice. Last surgery in 2008. Saw Dr. Coletta Memos.  12. She will need to stop Plavix/Aspirin pre op. By what she says, she takes these meds sporadically anyway.  HPI: [name] is a 55 y.o. AA female who is a patient of Pola Corn, MD (Dr. Algie Coffer is seeing [name] in Dr. Magda Kiel absence.) and comes to me today for groin hidradinitis.   She is an old patient of Dr. Dominga Ferry who was operated on both right axillas for hidradenitis.  She has done well with these axillary excisions.   She's had increasing trouble with hidradenitis in her groin pubic area. I did an I&D of the left pubic area and excision of the LLQ area.  She's doing well.  No specific complaint.  PE: BP 126/80  Pulse 80  Abdomen:  LLQ wound looks good.  Suprapubic wound is clean with some ingrowth of skin.  She is to continue local wound care.

## 2011-01-21 NOTE — Op Note (Signed)
[name], [name] NO.:  [id number]  MEDICAL RECORD NO.:  [id number]  LOCATION:                                 FACILITY:  PHYSICIAN:  Sandria Bales. Ezzard Standing, M.D.  DATE OF BIRTH:  04/14/1956  DATE OF PROCEDURE:  01/11/2011                              OPERATIVE REPORT   PREOPERATIVE DIAGNOSIS:  Recurrent infected cyst left lower quadrant, suprapubic infection (hidradenitis).  POSTOPERATIVE DIAGNOSIS: 1. Left lower quadrant cyst/chronically infected 2.  Suprapubic infection, abscess (hidradenitis).  PROCEDURE:  Excision of cyst of left lower quadrant with primary closure and I and D of suprapubic infection.  SURGEON:  Sandria Bales. Ezzard Standing, MD  FIRST ASSISTANT:  None.  ANESTHESIA:  LMA with 15 mL of 0.25% Marcaine.  The two specimens, there was a left lower quadrant cyst sent to pathology.  Also, there was a cyst-like structure associated with the suprapubic infection and I sent this to pathology.  INDICATIONS FOR PROCEDURE:  [name] is a 55 year old African American female who sees Dr. Donia Guiles, as her primary medical doctor, and Dr. Orpah Cobb, from a heart standpoint.  She had hidradenitis excised from both axillae in the past by Dr. Lurene Shadow.  She has developed recurrent infection in the left lower quadrant which actually pretty well healed right now and she has an abscess over left suprapubic area.  I discussed with the patient the indications, potential complications of surgery.  Potential complication of surgery include recurrent infections, nerve injury.  We talked a lot that she needs to get a better diabetes under control, blood sugars yesterday were 311, though today they were in the 100s.  She also needs to quit smoking.  Her sister was in room with whom I went through this discussion.  OPERATIVE NOTE:  The patient was placed in the room #3 at the [facility] Day surgery, Dr. Isidor Holts was supervising anesthesiologist.  She underwent a LMA  anesthesia.    A time out was held and the surgical checklist run.  Her lower abdomen and suprapubic area was prepped with ChloraPrep may excise the cyst of her left lower abdomen this was about 2 cm length incision.  This was then closed with interrupted 5-0 Monocryl and painted with Dermabond. The wound was not infected at this time.  So I first elliptically excised this and covered with Dermabond to see if we can get away with kind of management.    Her suprapubic area, she had about a 2 cm abscess.  I excised the skin associated with the abscess and there was a cyst like structure involved in the abscess, so I sent this off for pathology also.  I then packed the wound with saline gauze.  As a local anesthetic, I used 0.25% Marcaine, I used about 8 mL and this incision for a total of 15 or 16 mL of 0.25% Marcaine plain.  The patient tolerated the procedure well, was transported to recovery room in good condition.  Sponge and needle counts were correct at the end of the case.   Sandria Bales. Ezzard Standing, M.D., FACS    DHN/MEDQ  D:  01/11/2011  T:  01/11/2011  Job:  [id number]  cc:   Osvaldo Shipper. Spruill, M.D. Ricki Rodriguez, M.D.  Electronically Signed by Ovidio Kin M.D. on 01/21/2011 11:14:32 AM

## 2011-01-28 LAB — CK TOTAL AND CKMB (NOT AT ARMC)
CK, MB: 0.4
CK, MB: 0.5
CK, MB: 0.5
Relative Index: INVALID
Relative Index: INVALID
Relative Index: INVALID
Total CK: 39
Total CK: 43
Total CK: 50

## 2011-01-28 LAB — HEMOGLOBIN A1C
Hgb A1c MFr Bld: 7.5 — ABNORMAL HIGH
Mean Plasma Glucose: 190

## 2011-01-28 LAB — CBC
HCT: 31.4 — ABNORMAL LOW
HCT: 40.9
Hemoglobin: 10.8 — ABNORMAL LOW
Hemoglobin: 13.8
MCHC: 33.7
MCHC: 34.2
MCV: 103.1 — ABNORMAL HIGH
MCV: 85.6
Platelets: 143 — ABNORMAL LOW
Platelets: 187
RBC: 3.05 — ABNORMAL LOW
RBC: 4.78
RDW: 12.6
RDW: 18.6 — ABNORMAL HIGH
WBC: 5.6
WBC: 5.9

## 2011-01-28 LAB — I-STAT 8, (EC8 V) (CONVERTED LAB)
Acid-Base Excess: 5 — ABNORMAL HIGH
BUN: 8
Bicarbonate: 27.6 — ABNORMAL HIGH
Chloride: 106
Glucose, Bld: 207 — ABNORMAL HIGH
HCT: 48 — ABNORMAL HIGH
Hemoglobin: 16.3 — ABNORMAL HIGH
Operator id: 288331
Potassium: 4.4
Sodium: 136
TCO2: 29
pCO2, Ven: 32.8 — ABNORMAL LOW
pH, Ven: 7.533 — ABNORMAL HIGH

## 2011-01-28 LAB — COMPREHENSIVE METABOLIC PANEL
ALT: 33
ALT: 37 — ABNORMAL HIGH
AST: 24
AST: 33
Albumin: 3.6
Albumin: 3.8
Alkaline Phosphatase: 58
Alkaline Phosphatase: 66
BUN: 6
BUN: 7
CO2: 28
CO2: 30
Calcium: 9.2
Calcium: 9.8
Chloride: 101
Chloride: 103
Creatinine, Ser: 0.65
Creatinine, Ser: 0.68
GFR calc Af Amer: >60
GFR calc Af Amer: >60
GFR calc non Af Amer: >60
GFR calc non Af Amer: >60
Glucose, Bld: 139 — ABNORMAL HIGH
Glucose, Bld: 144 — ABNORMAL HIGH
Potassium: 3.7
Potassium: 3.8
Sodium: 138
Sodium: 139
Total Bilirubin: 0.5
Total Bilirubin: 0.8
Total Protein: 6.2
Total Protein: 6.8

## 2011-01-28 LAB — POCT CARDIAC MARKERS
CKMB, poc: <1 — ABNORMAL LOW
Myoglobin, poc: 37.5
Operator id: 288331
Troponin i, poc: <0.05

## 2011-01-28 LAB — LIPID PANEL
Cholesterol: 210 — ABNORMAL HIGH
HDL: 25 — ABNORMAL LOW
LDL Cholesterol: UNDETERMINED
Total CHOL/HDL Ratio: 8.4
Triglycerides: 431 — ABNORMAL HIGH
VLDL: UNDETERMINED

## 2011-01-28 LAB — DIFFERENTIAL
Basophils Absolute: 0
Basophils Relative: 0
Eosinophils Absolute: 0.1
Eosinophils Relative: 1
Lymphocytes Relative: 28
Lymphs Abs: 1.5
Monocytes Absolute: 1.1 — ABNORMAL HIGH
Monocytes Relative: 19 — ABNORMAL HIGH
Neutro Abs: 2.9
Neutrophils Relative %: 52

## 2011-01-28 LAB — PROTIME-INR
INR: 0.9
Prothrombin Time: 12.5

## 2011-01-28 LAB — TROPONIN I
Troponin I: 0.01
Troponin I: 0.02
Troponin I: 0.02

## 2011-01-28 LAB — POCT I-STAT CREATININE
Creatinine, Ser: 0.9
Operator id: 288331

## 2011-01-28 LAB — APTT: aPTT: 31

## 2011-01-28 LAB — HOMOCYSTEINE: Homocysteine: 12.9

## 2011-01-28 LAB — SEDIMENTATION RATE: Sed Rate: 5

## 2011-02-12 LAB — CBC
HCT: 42.6
Hemoglobin: 14.1
MCHC: 33.1
MCV: 86.1
Platelets: 258
RBC: 4.95
RDW: 13.8
WBC: 8.2

## 2011-02-12 LAB — BASIC METABOLIC PANEL
BUN: 5 — ABNORMAL LOW
CO2: 27
Calcium: 10
Chloride: 106
Creatinine, Ser: 0.75
GFR calc Af Amer: >60
GFR calc non Af Amer: >60
Glucose, Bld: 109 — ABNORMAL HIGH
Potassium: 3.9
Sodium: 141

## 2011-02-18 LAB — BASIC METABOLIC PANEL
BUN: 10
CO2: 30
Calcium: 9.6
Chloride: 107
Creatinine, Ser: 0.81
GFR calc Af Amer: >60
GFR calc non Af Amer: >60
Glucose, Bld: 108 — ABNORMAL HIGH
Potassium: 3.2 — ABNORMAL LOW
Sodium: 144

## 2011-02-18 LAB — CBC
HCT: 42.9
Hemoglobin: 14.3
MCHC: 33.4
MCV: 84.4
Platelets: 245
RBC: 5.09
RDW: 13.1
WBC: 5.2

## 2011-02-26 ENCOUNTER — Other Ambulatory Visit: Payer: Self-pay | Admitting: Cardiovascular Disease

## 2011-02-26 ENCOUNTER — Ambulatory Visit
Admission: RE | Admit: 2011-02-26 | Discharge: 2011-02-26 | Disposition: A | Discharge status: Home / Self Care | Payer: Medicare Other | Source: Ambulatory Visit | Attending: Cardiovascular Disease | Admitting: Cardiovascular Disease

## 2011-02-26 DIAGNOSIS — J40 Bronchitis, not specified as acute or chronic: Secondary | ICD-10-CM

## 2011-04-08 ENCOUNTER — Other Ambulatory Visit (HOSPITAL_COMMUNITY): Payer: Self-pay | Admitting: Cardiovascular Disease

## 2011-04-19 ENCOUNTER — Other Ambulatory Visit (HOSPITAL_COMMUNITY): Payer: Medicare Other

## 2011-05-02 ENCOUNTER — Other Ambulatory Visit (HOSPITAL_COMMUNITY): Payer: Self-pay | Admitting: Cardiovascular Disease

## 2011-05-08 ENCOUNTER — Ambulatory Visit (HOSPITAL_COMMUNITY)
Admission: RE | Admit: 2011-05-08 | Discharge: 2011-05-08 | Disposition: A | Discharge status: Home / Self Care | Payer: Medicare Other | Source: Ambulatory Visit | Attending: Cardiovascular Disease | Admitting: Cardiovascular Disease

## 2011-05-08 ENCOUNTER — Encounter (HOSPITAL_COMMUNITY)
Admission: RE | Admit: 2011-05-08 | Discharge: 2011-05-08 | Disposition: A | Discharge status: Home / Self Care | Payer: Medicare Other | Source: Ambulatory Visit | Attending: Cardiovascular Disease | Admitting: Cardiovascular Disease

## 2011-05-08 DIAGNOSIS — R079 Chest pain, unspecified: Secondary | ICD-10-CM | POA: Insufficient documentation

## 2011-05-08 DIAGNOSIS — R0602 Shortness of breath: Secondary | ICD-10-CM | POA: Insufficient documentation

## 2011-05-08 MED ORDER — REGADENOSON 0.4 MG/5ML IV SOLN
0.4000 mg | Freq: Once | INTRAVENOUS | Status: AC
  Administered 2011-05-08: 0.4 mg via INTRAVENOUS

## 2011-05-08 MED ORDER — TECHNETIUM TC 99M TETROFOSMIN IV KIT
30.0000 | PACK | Freq: Once | INTRAVENOUS | Status: AC | PRN
  Administered 2011-05-08: 30 via INTRAVENOUS

## 2011-05-08 MED ORDER — TECHNETIUM TC 99M TETROFOSMIN IV KIT
10.0000 | PACK | Freq: Once | INTRAVENOUS | Status: AC | PRN
  Administered 2011-05-08: 10 via INTRAVENOUS

## 2011-05-08 MED ORDER — REGADENOSON 0.4 MG/5ML IV SOLN
INTRAVENOUS | Status: AC
  Filled 2011-05-08: qty 5

## 2011-06-05 ENCOUNTER — Telehealth (INDEPENDENT_AMBULATORY_CARE_PROVIDER_SITE_OTHER): Payer: Self-pay | Admitting: General Surgery

## 2011-06-05 NOTE — Telephone Encounter (Signed)
Per Dr Ezzard Standing, he would not just write antibiotics she needs to be seen.  She could call her medical md or come to urgent office.  [name] called pt and made appointment for 1/31.

## 2011-06-05 NOTE — Telephone Encounter (Signed)
The patient contacted the office stating she has an area of abscess above the surgical site. She states she is having some drainage and has been using warm compresses and antibiotic ointment. She is requesting an antibiotic to be called to her pharmacy.

## 2011-06-06 ENCOUNTER — Ambulatory Visit (INDEPENDENT_AMBULATORY_CARE_PROVIDER_SITE_OTHER): Payer: Medicare Other | Admitting: General Surgery

## 2011-06-06 ENCOUNTER — Encounter (INDEPENDENT_AMBULATORY_CARE_PROVIDER_SITE_OTHER): Payer: Self-pay | Admitting: General Surgery

## 2011-06-06 DIAGNOSIS — L02219 Cutaneous abscess of trunk, unspecified: Secondary | ICD-10-CM

## 2011-06-06 DIAGNOSIS — L03319 Cellulitis of trunk, unspecified: Secondary | ICD-10-CM

## 2011-06-06 DIAGNOSIS — L02214 Cutaneous abscess of groin: Secondary | ICD-10-CM | POA: Insufficient documentation

## 2011-06-06 MED ORDER — DOXYCYCLINE HYCLATE 100 MG PO TABS: 100.0000 mg | ORAL_TABLET | Freq: Two times a day (BID) | ORAL | Status: AC

## 2011-06-06 NOTE — Patient Instructions (Signed)
Apply ice to area tonight.  Remove bandage and packing in 24 hours then clean wound in the shower twice a day and apply a dry dressing to the area.  Go the emergency room if you have heavy bleeding.

## 2011-06-07 NOTE — Progress Notes (Signed)
Patient ID: [name], female   DOB: 1955/07/26, 56 y.o.   MRN: [id number]  Chief Complaint  Patient presents with  . Abscess    left groin    HPI [name] is a 56 y.o. female.   HPI  She presents today with a painful left groin abscess. She has a history of hidradenitis involving the axilla, and groin area. She's been seen by a number of people in our practice. She's been trying to treat this area with warm compresses but it has continued to become more painful and increased somewhat in size. She has some antibiotics for a previous pneumonia and she tried to take these but this did not help.  Past Medical History  Diagnosis Date  . Diabetes mellitus   . Hypertension   . Stroke   . Anxiety   . Heart attack   . Hyperlipidemia   . GERD (gastroesophageal reflux disease)   . Irritable bowel syndrome   . Chronic headaches   . Sinus problem   . Hidradenitis     groin    Past Surgical History  Procedure Date  . Hiatal hernia repair   . Breast surgery 2007, 2008  . Breast reduction surgery   . Axillary hidradenitis excision 1990-2008    bilateral  . Excision hidradenitis groin and abdomen 2012    Family History  Problem Relation Age of Onset  . Cancer Mother     breast  . Heart disease Mother   . Hypertension Mother   . Diabetes Father   . Heart disease Father   . Stroke Father   . Heart disease Sister   . Hypertension Sister   . Hypertension Sister   . Hyperlipidemia Sister   . Diabetes Sister     Social History History  Substance Use Topics  . Smoking status: Current Everyday Smoker  . Smokeless tobacco: Not on file   Comment: trying to wuit... less than .25 pack  . Alcohol Use: No    Allergies  Allergen Reactions  . Iodine Swelling    Current Outpatient Prescriptions  Medication Sig Dispense Refill  . alprazolam (XANAX) 2 MG tablet Take 2 mg by mouth as needed.        . clopidogrel (PLAVIX) 75 MG tablet Take 75 mg by mouth daily.        Marland Kitchen  diltiazem (CARDIZEM CD) 120 MG 24 hr capsule Take 120 mg by mouth daily.        Marland Kitchen esomeprazole (NEXIUM) 40 MG capsule Take 40 mg by mouth daily before breakfast.        . glimepiride (AMARYL) 4 MG tablet Take 4 mg by mouth daily before breakfast.        . mometasone (NASONEX) 50 MCG/ACT nasal spray Place 2 sprays into the nose as needed.        . mupirocin (BACTROBAN) 2 % cream Apply 1 application topically daily as needed.        . Potassium Chloride Crys CR (KLOR-CON M20 PO) Take 1 capsule by mouth daily.        . Rosuvastatin Calcium (CRESTOR PO) Take by mouth daily.      Marland Kitchen aspirin 81 MG tablet Take 81 mg by mouth daily.        Marland Kitchen doxycycline (VIBRA-TABS) 100 MG tablet Take 1 tablet (100 mg total) by mouth 2 (two) times daily.  20 tablet  2    Review of Systems Review of Systems  Constitutional: Negative for  fever and chills.    There were no vitals taken for this visit.  Physical Exam Physical Exam  Constitutional: She appears well-developed and well-nourished.  Genitourinary:       Left groin scars her parents. In the left groin crease lateral to the labia is 8-2.5 cm tender indurated somewhat fluctuant area.    Data Reviewed Old notes  Assessment    Recurrent left groin hidradenitis with abscess. This requires drainage. She is not taken her Plavix.    Plan    Careful incision and drainage and then tight packing. Wound care instructions were given to her. She will return in the office in 2-3 weeks.  Procedure: In the left groin area was sterilely prepped and the abscess anesthetized with Xylocaine with epinephrine. A small cruciate incision was made and the corners were cut. Purulent material was evacuated. The was tightly packed with iodoform gauze followed by bulky dry dressing. She was monitored for 20 minutes and no excessive bleeding was noted. She tolerated the procedure well.       Claudie Rathbone J 06/06/11  4:30 PM

## 2011-06-19 ENCOUNTER — Telehealth (INDEPENDENT_AMBULATORY_CARE_PROVIDER_SITE_OTHER): Payer: Self-pay | Admitting: General Surgery

## 2011-06-19 NOTE — Telephone Encounter (Signed)
Returned pt's call and verified that she does have a yeast infection.  I will talk with Dr. Abbey Chatters, and call in a Rx to her pharmacy tomorrow.

## 2011-06-21 ENCOUNTER — Telehealth (INDEPENDENT_AMBULATORY_CARE_PROVIDER_SITE_OTHER): Payer: Self-pay

## 2011-06-21 NOTE — Telephone Encounter (Signed)
Rx for Diflucan 150mg  #2 called in to [facility]. Pt is aware.

## 2011-07-03 ENCOUNTER — Ambulatory Visit (INDEPENDENT_AMBULATORY_CARE_PROVIDER_SITE_OTHER): Payer: Medicare Other | Admitting: General Surgery

## 2011-07-03 ENCOUNTER — Encounter (INDEPENDENT_AMBULATORY_CARE_PROVIDER_SITE_OTHER): Payer: Self-pay | Admitting: General Surgery

## 2011-07-03 VITALS — BP 140/88 | HR 116 | Temp 97.0°F | Resp 16 | Ht 64.0 in | Wt 143.6 lb

## 2011-07-03 DIAGNOSIS — L732 Hidradenitis suppurativa: Secondary | ICD-10-CM

## 2011-07-03 MED ORDER — DOXYCYCLINE HYCLATE 100 MG PO TABS: 100.0000 mg | ORAL_TABLET | Freq: Two times a day (BID) | ORAL | Status: DC

## 2011-07-03 MED ORDER — FLUCONAZOLE 100 MG PO TABS: 100.0000 mg | ORAL_TABLET | Freq: Every day | ORAL | Status: AC

## 2011-07-03 NOTE — Patient Instructions (Signed)
Clean wound with peroxide and water daily until it heals.  Once is has healed, take a clorox bath three times a week by adding one capful of Clorox to a warm water bath.

## 2011-07-03 NOTE — Progress Notes (Signed)
She is here for followup after drainage of her left groin abscess. She has chronic hidradenitis of the groin area. She states that area has healed but now she is developing another area in the inner left thigh.  Exam: Left groin-wound is healed well. There is a small fluctuant area in the left inner thigh that is present consistent with a small abscess. I cleansed this with alcohol and made a very small incision in it with a needle and drained some purulent fluid. It was then cleaned with peroxide and Betadine and a dressing was applied.  Assessment: Left groin abscess is-one has resolved, a new one presented itself and I drained it.  Plan: Clean the open wound area proximal and water until it heals and start Clorox baths 3 times a week. One week of doxycycline. Diflucan if she gets a yeast infection. Return visit p.r.n.

## 2011-07-05 ENCOUNTER — Encounter (HOSPITAL_COMMUNITY): Payer: Self-pay | Admitting: *Deleted

## 2011-07-05 ENCOUNTER — Inpatient Hospital Stay (HOSPITAL_COMMUNITY)
Admission: EM | Admit: 2011-07-05 | Discharge: 2011-07-07 | DRG: 313 | Disposition: A | Discharge status: Home / Self Care | Payer: Medicare Other | Attending: Cardiovascular Disease | Admitting: Cardiovascular Disease

## 2011-07-05 ENCOUNTER — Other Ambulatory Visit: Payer: Self-pay

## 2011-07-05 ENCOUNTER — Emergency Department (HOSPITAL_COMMUNITY): Payer: Medicare Other

## 2011-07-05 DIAGNOSIS — I10 Essential (primary) hypertension: Secondary | ICD-10-CM | POA: Diagnosis present

## 2011-07-05 DIAGNOSIS — F411 Generalized anxiety disorder: Secondary | ICD-10-CM | POA: Diagnosis present

## 2011-07-05 DIAGNOSIS — Z7982 Long term (current) use of aspirin: Secondary | ICD-10-CM

## 2011-07-05 DIAGNOSIS — I252 Old myocardial infarction: Secondary | ICD-10-CM

## 2011-07-05 DIAGNOSIS — E785 Hyperlipidemia, unspecified: Secondary | ICD-10-CM | POA: Diagnosis present

## 2011-07-05 DIAGNOSIS — Z8673 Personal history of transient ischemic attack (TIA), and cerebral infarction without residual deficits: Secondary | ICD-10-CM

## 2011-07-05 DIAGNOSIS — K589 Irritable bowel syndrome without diarrhea: Secondary | ICD-10-CM | POA: Diagnosis present

## 2011-07-05 DIAGNOSIS — Z888 Allergy status to other drugs, medicaments and biological substances status: Secondary | ICD-10-CM

## 2011-07-05 DIAGNOSIS — K219 Gastro-esophageal reflux disease without esophagitis: Secondary | ICD-10-CM | POA: Diagnosis present

## 2011-07-05 DIAGNOSIS — R51 Headache: Secondary | ICD-10-CM | POA: Diagnosis present

## 2011-07-05 DIAGNOSIS — E119 Type 2 diabetes mellitus without complications: Secondary | ICD-10-CM | POA: Diagnosis present

## 2011-07-05 DIAGNOSIS — R0789 Other chest pain: Principal | ICD-10-CM | POA: Diagnosis present

## 2011-07-05 DIAGNOSIS — I200 Unstable angina: Secondary | ICD-10-CM

## 2011-07-05 DIAGNOSIS — R079 Chest pain, unspecified: Secondary | ICD-10-CM | POA: Diagnosis present

## 2011-07-05 DIAGNOSIS — Z79899 Other long term (current) drug therapy: Secondary | ICD-10-CM

## 2011-07-05 LAB — PROTIME-INR
INR: 1.08 (ref 0.00–1.49)
Prothrombin Time: 14.2 seconds (ref 11.6–15.2)

## 2011-07-05 LAB — BASIC METABOLIC PANEL
BUN: 15 mg/dL (ref 6–23)
CO2: 27 mEq/L (ref 19–32)
Calcium: 10.3 mg/dL (ref 8.4–10.5)
Chloride: 101 mEq/L (ref 96–112)
Creatinine, Ser: 0.93 mg/dL (ref 0.50–1.10)
GFR calc Af Amer: 78 mL/min — ABNORMAL LOW (ref >90)
GFR calc non Af Amer: 67 mL/min — ABNORMAL LOW (ref >90)
Glucose, Bld: 170 mg/dL — ABNORMAL HIGH (ref 70–99)
Potassium: 3.7 mEq/L (ref 3.5–5.1)
Sodium: 140 mEq/L (ref 135–145)

## 2011-07-05 LAB — DIFFERENTIAL
Basophils Absolute: 0 10*3/uL (ref 0.0–0.1)
Basophils Relative: 1 % (ref 0–1)
Eosinophils Absolute: 0 10*3/uL (ref 0.0–0.7)
Eosinophils Relative: 1 % (ref 0–5)
Lymphocytes Relative: 55 % — ABNORMAL HIGH (ref 12–46)
Lymphs Abs: 3.6 10*3/uL (ref 0.7–4.0)
Monocytes Absolute: 0.4 10*3/uL (ref 0.1–1.0)
Monocytes Relative: 6 % (ref 3–12)
Neutro Abs: 2.5 10*3/uL (ref 1.7–7.7)
Neutrophils Relative %: 38 % — ABNORMAL LOW (ref 43–77)

## 2011-07-05 LAB — CBC
HCT: 42.2 % (ref 36.0–46.0)
Hemoglobin: 14.1 g/dL (ref 12.0–15.0)
MCH: 27.9 pg (ref 26.0–34.0)
MCHC: 33.4 g/dL (ref 30.0–36.0)
MCV: 83.4 fL (ref 78.0–100.0)
Platelets: 234 10*3/uL (ref 150–400)
RBC: 5.06 MIL/uL (ref 3.87–5.11)
RDW: 12.9 % (ref 11.5–15.5)
WBC: 6.6 10*3/uL (ref 4.0–10.5)

## 2011-07-05 LAB — POCT I-STAT TROPONIN I: Troponin i, poc: 0 ng/mL (ref 0.00–0.08)

## 2011-07-05 LAB — APTT: aPTT: 30 seconds (ref 24–37)

## 2011-07-05 LAB — CK TOTAL AND CKMB (NOT AT ARMC)
CK, MB: 1.5 ng/mL (ref 0.3–4.0)
Relative Index: INVALID (ref 0.0–2.5)
Total CK: 93 U/L (ref 7–177)

## 2011-07-05 MED ORDER — HEPARIN BOLUS VIA INFUSION
4000.0000 [IU] | Freq: Once | INTRAVENOUS | Status: AC
  Administered 2011-07-05: 4000 [IU] via INTRAVENOUS

## 2011-07-05 MED ORDER — NITROGLYCERIN 0.4 MG SL SUBL
0.4000 mg | SUBLINGUAL_TABLET | SUBLINGUAL | Status: DC | PRN
  Administered 2011-07-05: 0.4 mg via SUBLINGUAL
  Filled 2011-07-05: qty 25

## 2011-07-05 MED ORDER — ONDANSETRON HCL 4 MG/2ML IJ SOLN
4.0000 mg | Freq: Once | INTRAMUSCULAR | Status: AC
  Administered 2011-07-05: 4 mg via INTRAVENOUS
  Filled 2011-07-05: qty 2

## 2011-07-05 MED ORDER — NITROGLYCERIN IN D5W 200-5 MCG/ML-% IV SOLN
5.0000 ug/min | INTRAVENOUS | Status: DC
  Administered 2011-07-05: 5 ug/min via INTRAVENOUS
  Filled 2011-07-05: qty 250

## 2011-07-05 MED ORDER — SODIUM CHLORIDE 0.9 % IV SOLN
INTRAVENOUS | Status: DC
  Administered 2011-07-05 – 2011-07-06 (×3): via INTRAVENOUS

## 2011-07-05 MED ORDER — HEPARIN (PORCINE) IN NACL 100-0.45 UNIT/ML-% IJ SOLN
1000.0000 [IU]/h | INTRAMUSCULAR | Status: DC
  Administered 2011-07-05 – 2011-07-06 (×2): 1000 [IU]/h via INTRAVENOUS
  Filled 2011-07-05 (×3): qty 250

## 2011-07-05 MED ORDER — SODIUM CHLORIDE 0.9 % IV BOLUS (SEPSIS)
1000.0000 mL | Freq: Once | INTRAVENOUS | Status: AC
  Administered 2011-07-05: 1000 mL via INTRAVENOUS

## 2011-07-05 MED ORDER — ASPIRIN 81 MG PO CHEW
324.0000 mg | CHEWABLE_TABLET | Freq: Once | ORAL | Status: AC
  Administered 2011-07-05: 324 mg via ORAL
  Filled 2011-07-05: qty 4

## 2011-07-05 NOTE — ED Notes (Signed)
Chest pain for 20 min pta, "tight like something sitting on my chest."  Took 1 ntg without relief

## 2011-07-05 NOTE — ED Provider Notes (Addendum)
History     CSN: [id number]  Arrival date & time 07/05/11  2725   First MD Initiated Contact with Patient 07/05/11 1844      Chief Complaint  Patient presents with  . Chest Pain    (Consider location/radiation/quality/duration/timing/severity/associated sxs/prior treatment) Patient is a 56 y.o. female presenting with chest pain. The history is provided by the patient and medical records.  Chest Pain The chest pain began less than 1 hour ago. Duration of episode(s) is 1 hour. Chest pain occurs constantly. The chest pain is improving. Associated with: nothing, the patient was at rest when symptoms started. At its most intense, the pain is at 10/10. The pain is currently at 6/10. The severity of the pain is moderate. The quality of the pain is described as heavy and pressure-like (similar to previous myocardial infarction). The pain does not radiate. Exacerbated by: anxiety and stress. Primary symptoms include shortness of breath and nausea. Pertinent negatives for primary symptoms include no fever, no fatigue, no syncope, no cough, no wheezing, no palpitations, no abdominal pain, no vomiting, no dizziness and no altered mental status. Primary symptoms comment: diaphoresis  Associated symptoms include diaphoresis.  Pertinent negatives for associated symptoms include no numbness and no weakness. She tried nitroglycerin (no relief in pain with nitroglycerin prehospital) for the symptoms. Risk factors include smoking/tobacco exposure.  Her past medical history is significant for CAD, diabetes, hyperlipidemia, hypertension and MI.  Pertinent negatives for past medical history include no CHF, no DVT and no PE.  Procedure history is positive for stress echo. Procedure history comments: negative stress echo for inducible ischemia in January of 2013.     Past Medical History  Diagnosis Date  . Diabetes mellitus   . Hypertension   . Stroke   . Anxiety   . Heart attack   . Hyperlipidemia   . GERD  (gastroesophageal reflux disease)   . Irritable bowel syndrome   . Chronic headaches   . Sinus problem   . Hidradenitis     groin    Past Surgical History  Procedure Date  . Hiatal hernia repair   . Breast surgery 2007, 2008  . Breast reduction surgery   . Axillary hidradenitis excision 1990-2008    bilateral  . Excision hidradenitis groin and abdomen 2012  . Back surgery   . Cervical disc surgery     Family History  Problem Relation Age of Onset  . Cancer Mother     breast  . Heart disease Mother   . Hypertension Mother   . Diabetes Father   . Heart disease Father   . Stroke Father   . Heart disease Sister   . Hypertension Sister   . Hypertension Sister   . Hyperlipidemia Sister   . Diabetes Sister     History  Substance Use Topics  . Smoking status: Current Everyday Smoker -- 0.3 packs/day  . Smokeless tobacco: Not on file   Comment: trying to wuit... less than .25 pack  . Alcohol Use: No    OB History    Grav Para Term Preterm Abortions TAB SAB Ect Mult Living                  Review of Systems  Constitutional: Positive for diaphoresis. Negative for fever, chills, activity change, appetite change and fatigue.  HENT: Negative for congestion, facial swelling, rhinorrhea, neck pain, neck stiffness and postnasal drip.   Eyes: Negative.   Respiratory: Positive for shortness of breath. Negative for cough,  choking, chest tightness and wheezing.        Dyspnea on exertion  Cardiovascular: Positive for chest pain. Negative for palpitations, leg swelling and syncope.  Gastrointestinal: Positive for nausea. Negative for vomiting, abdominal pain and abdominal distention.  Musculoskeletal: Negative.   Skin: Negative.   Neurological: Negative for dizziness, syncope, speech difficulty, weakness, light-headedness, numbness and headaches.  Hematological: Does not bruise/bleed easily.  Psychiatric/Behavioral: Negative.  Negative for altered mental status.     Allergies  Iodine and Metformin and related  Home Medications   Current Outpatient Rx  Name Route Sig Dispense Refill  . ALPRAZOLAM 2 MG PO TABS Oral Take 2 mg by mouth as needed.      . ASPIRIN 81 MG PO TABS Oral Take 81 mg by mouth daily.      Marland Kitchen CLOPIDOGREL BISULFATE 75 MG PO TABS Oral Take 75 mg by mouth daily.      Marland Kitchen DOXYCYCLINE HYCLATE 100 MG PO TABS Oral Take 1 tablet (100 mg total) by mouth 2 (two) times daily. 14 tablet 2  . ESOMEPRAZOLE MAGNESIUM 40 MG PO CPDR Oral Take 40 mg by mouth daily before breakfast.      . FLUCONAZOLE 100 MG PO TABS Oral Take 1 tablet (100 mg total) by mouth daily. 2 tablet 0    Take if you get a yeast infection.  Marland Kitchen GLIMEPIRIDE 4 MG PO TABS Oral Take 4 mg by mouth daily before breakfast.      . IBUPROFEN 800 MG PO TABS Oral Take 800 mg by mouth every 8 (eight) hours as needed.    . TRADJENTA PO Oral Take by mouth daily.    . MOMETASONE FUROATE 50 MCG/ACT NA SUSP Nasal Place 2 sprays into the nose as needed.      Marland Kitchen MUPIROCIN CALCIUM 2 % EX CREA Topical Apply 1 application topically daily as needed.      Marland Kitchen NITROGLYCERIN 0.4 MG/HR TD PT24 Transdermal Place 1 patch onto the skin daily.    Marland Kitchen NITROGLYCERIN 0.4 MG SL SUBL Sublingual Place 0.4 mg under the tongue every 5 (five) minutes as needed.    Marya Landry PO Oral Take by mouth daily.    Marland Kitchen KLOR-CON M20 PO Oral Take 1 capsule by mouth daily.      . CRESTOR PO Oral Take by mouth daily.    . TRAMADOL HCL 50 MG PO TABS Oral Take 50 mg by mouth every 6 (six) hours as needed.    Marland Kitchen ZOLPIDEM TARTRATE 10 MG PO TABS Oral Take 10 mg by mouth at bedtime as needed.      BP 100/62  Pulse 88  Temp(Src) 98.1 F (36.7 C) (Oral)  Resp 16  Ht 5\' 4"  (1.626 m)  Wt 143 lb (64.864 kg)  BMI 24.55 kg/m2  SpO2 98%  Physical Exam  Nursing note and vitals reviewed. Constitutional: She is oriented to person, place, and time. She appears well-developed and well-nourished. No distress.  HENT:  Head: Normocephalic and  atraumatic.  Mouth/Throat: Oropharynx is clear and moist.  Eyes: EOM are normal. Pupils are equal, round, and reactive to light.  Neck: Normal range of motion. Neck supple. No JVD present. No tracheal deviation present.  Cardiovascular: Normal rate, regular rhythm, normal heart sounds and intact distal pulses.   No extrasystoles are present. Exam reveals no gallop, no S3, no S4 and no friction rub.   No murmur heard. Pulmonary/Chest: Breath sounds normal. No accessory muscle usage or stridor. Not tachypneic. No respiratory  distress. She has no wheezes. She has no rales. She exhibits no tenderness.  Abdominal: Soft. Bowel sounds are normal. She exhibits no distension. There is no tenderness.  Musculoskeletal: Normal range of motion. She exhibits no edema and no tenderness.  Neurological: She is alert and oriented to person, place, and time. She has normal reflexes. No cranial nerve deficit. Coordination normal.  Skin: Skin is warm and dry. No rash noted. She is not diaphoretic. No erythema. No pallor.  Psychiatric: She has a normal mood and affect. Her behavior is normal. Judgment and thought content normal.    ED Course  Procedures (including critical care time)   Date: 07/05/2011  Rate: 85   Rhythm: normal sinus rhythm and sinus arrhythmia  QRS Axis: normal  Intervals: normal  ST/T Wave abnormalities: normal, early repolarization and early repolarization in leads V1 and V2  Conduction Disutrbances:none  Narrative Interpretation: non-provocative EKG  Old EKG Reviewed: unchanged    Labs Reviewed  POCT I-STAT TROPONIN I  CBC  DIFFERENTIAL  BASIC METABOLIC PANEL  CK TOTAL AND CKMB  PROTIME-INR  APTT   Dg Chest Port 1 View  07/05/2011  *RADIOLOGY REPORT*  Clinical Data: chest pain  PORTABLE CHEST - 1 VIEW  Comparison: 02/26/2011  Findings: The heart size and mediastinal contours are within normal limits.  Both lungs are clear.  The visualized skeletal structures are unremarkable.   IMPRESSION: No active cardiopulmonary abnormalities.  Original Report Authenticated By: Rosealee Albee, M.D.     No diagnosis found.    MDM  ACS, Unstable Angina, Myocardial Infarction, Musculoskeletal chest pain, costochondritis, GERD, Gastrointestinal Chest Pain, Pleuritic Chest Pain, Pneumonia, Pneumothorax, Pulmonary Embolism, Esophageal Spasm, Arrhythmia considered among other potential etiologies in the patient's differential diagnosis.         Felisa Bonier, MD 07/05/11 1924  8:23 PM I spoke with Dr. Algie Coffer, the patient's cardiologist and he has agreed to admit the patient at [facility] under his care to a telemetry unit. He advises to start heparin bolus and infusion. I will do so and arrange for transfer and admission.  Felisa Bonier, MD 07/05/11 2023

## 2011-07-05 NOTE — ED Notes (Signed)
[facility] at the bedside at this time. 

## 2011-07-05 NOTE — ED Notes (Signed)
Pt c/o nausea at this time, EDP notified orders received for 4 mg Zofran IV.

## 2011-07-05 NOTE — ED Notes (Signed)
[facility] states they should be here in 20 minutes.

## 2011-07-05 NOTE — ED Notes (Signed)
Pt reports having chest pain approximately 20 minutes prior to arrival. Pt describes the pain as a chest "tightness." pt states that she was ordering take out when the pain began. Pt reports nausea and diaphoresis but denies vomiting. Pt reports taking 1 nitro SL prior to arrival with no relief. Pt placed on cardiac monitor and NSR at this time. Pt placed on pulse oximetry 100% on 2 L/min. Pt alert and oriented at this time.

## 2011-07-05 NOTE — ED Notes (Signed)
Family at bedside. 

## 2011-07-05 NOTE — ED Notes (Signed)
MD at bedside. 

## 2011-07-05 NOTE — ED Notes (Signed)
Spoke with [employer] from [place].

## 2011-07-05 NOTE — ED Notes (Signed)
Pt up ambulating to the bathroom with assistance of ED Tech at this time. Pt tolerated well.

## 2011-07-06 ENCOUNTER — Other Ambulatory Visit: Payer: Self-pay

## 2011-07-06 DIAGNOSIS — R079 Chest pain, unspecified: Secondary | ICD-10-CM | POA: Diagnosis present

## 2011-07-06 LAB — CARDIAC PANEL(CRET KIN+CKTOT+MB+TROPI)
CK, MB: 1.8 ng/mL (ref 0.3–4.0)
CK, MB: 1.8 ng/mL (ref 0.3–4.0)
CK, MB: 1.5 ng/mL (ref 0.3–4.0)
Relative Index: INVALID (ref 0.0–2.5)
Relative Index: INVALID (ref 0.0–2.5)
Relative Index: INVALID (ref 0.0–2.5)
Total CK: 69 U/L (ref 7–177)
Total CK: 66 U/L (ref 7–177)
Total CK: 63 U/L (ref 7–177)
Troponin I: <0.3 ng/mL (ref <0.30)
Troponin I: <0.3 ng/mL (ref <0.30)
Troponin I: <0.3 ng/mL (ref <0.30)

## 2011-07-06 LAB — GLUCOSE, CAPILLARY
Glucose-Capillary: 152 mg/dL — ABNORMAL HIGH (ref 70–99)
Glucose-Capillary: 183 mg/dL — ABNORMAL HIGH (ref 70–99)
Glucose-Capillary: 134 mg/dL — ABNORMAL HIGH (ref 70–99)
Glucose-Capillary: 96 mg/dL (ref 70–99)
Glucose-Capillary: 140 mg/dL — ABNORMAL HIGH (ref 70–99)

## 2011-07-06 LAB — HEPARIN LEVEL (UNFRACTIONATED)
Heparin Unfractionated: 0.5 IU/mL (ref 0.30–0.70)
Heparin Unfractionated: 0.59 IU/mL (ref 0.30–0.70)

## 2011-07-06 LAB — LIPID PANEL
Cholesterol: 103 mg/dL (ref 0–200)
HDL: 32 mg/dL — ABNORMAL LOW (ref >39)
LDL Cholesterol: 36 mg/dL (ref 0–99)
Total CHOL/HDL Ratio: 3.2 RATIO
Triglycerides: 176 mg/dL — ABNORMAL HIGH (ref <150)
VLDL: 35 mg/dL (ref 0–40)

## 2011-07-06 LAB — HEMOGLOBIN A1C
Hgb A1c MFr Bld: 7.9 % — ABNORMAL HIGH (ref <5.7)
Mean Plasma Glucose: 180 mg/dL — ABNORMAL HIGH (ref <117)

## 2011-07-06 LAB — BASIC METABOLIC PANEL
BUN: 13 mg/dL (ref 6–23)
CO2: 25 mEq/L (ref 19–32)
Calcium: 9.2 mg/dL (ref 8.4–10.5)
Chloride: 106 mEq/L (ref 96–112)
Creatinine, Ser: 0.7 mg/dL (ref 0.50–1.10)
GFR calc Af Amer: >90 mL/min (ref >90)
GFR calc non Af Amer: >90 mL/min (ref >90)
Glucose, Bld: 140 mg/dL — ABNORMAL HIGH (ref 70–99)
Potassium: 3.5 mEq/L (ref 3.5–5.1)
Sodium: 140 mEq/L (ref 135–145)

## 2011-07-06 LAB — PROTIME-INR
INR: 1.08 (ref 0.00–1.49)
Prothrombin Time: 14.2 seconds (ref 11.6–15.2)

## 2011-07-06 LAB — CBC
HCT: 35 % — ABNORMAL LOW (ref 36.0–46.0)
Hemoglobin: 11.5 g/dL — ABNORMAL LOW (ref 12.0–15.0)
MCH: 27.3 pg (ref 26.0–34.0)
MCHC: 32.9 g/dL (ref 30.0–36.0)
MCV: 83.1 fL (ref 78.0–100.0)
Platelets: 191 10*3/uL (ref 150–400)
RBC: 4.21 MIL/uL (ref 3.87–5.11)
RDW: 13.1 % (ref 11.5–15.5)
WBC: 8 10*3/uL (ref 4.0–10.5)

## 2011-07-06 LAB — TSH: TSH: 0.998 u[IU]/mL (ref 0.350–4.500)

## 2011-07-06 LAB — MRSA PCR SCREENING: MRSA by PCR: NEGATIVE

## 2011-07-06 LAB — D-DIMER, QUANTITATIVE: D-Dimer, Quant: <0.22 ug/mL-FEU (ref 0.00–0.48)

## 2011-07-06 MED ORDER — LINAGLIPTIN 5 MG PO TABS
5.0000 mg | ORAL_TABLET | Freq: Every day | ORAL | Status: DC
  Administered 2011-07-06 – 2011-07-07 (×2): 5 mg via ORAL
  Filled 2011-07-06 (×2): qty 1

## 2011-07-06 MED ORDER — SODIUM CHLORIDE 0.9 % IJ SOLN: 3.0000 mL | INTRAMUSCULAR | Status: DC | PRN

## 2011-07-06 MED ORDER — SODIUM CHLORIDE 0.9 % IV SOLN: 250.0000 mL | INTRAVENOUS | Status: DC | PRN

## 2011-07-06 MED ORDER — ACETAMINOPHEN 325 MG PO TABS
650.0000 mg | ORAL_TABLET | ORAL | Status: DC | PRN
  Administered 2011-07-06: 650 mg via ORAL
  Filled 2011-07-06: qty 2

## 2011-07-06 MED ORDER — ATORVASTATIN CALCIUM 10 MG PO TABS
10.0000 mg | ORAL_TABLET | Freq: Every day | ORAL | Status: DC
  Administered 2011-07-06: 10 mg via ORAL
  Filled 2011-07-06 (×2): qty 1

## 2011-07-06 MED ORDER — ALPRAZOLAM 0.25 MG PO TABS
2.0000 mg | ORAL_TABLET | Freq: Every day | ORAL | Status: DC | PRN
  Administered 2011-07-06: 2 mg via ORAL
  Filled 2011-07-06 (×2): qty 4

## 2011-07-06 MED ORDER — ALPRAZOLAM 0.25 MG PO TABS: 0.2500 mg | ORAL_TABLET | Freq: Two times a day (BID) | ORAL | Status: DC | PRN

## 2011-07-06 MED ORDER — GLIMEPIRIDE 4 MG PO TABS
4.0000 mg | ORAL_TABLET | Freq: Two times a day (BID) | ORAL | Status: DC
  Administered 2011-07-06 (×2): 4 mg via ORAL
  Filled 2011-07-06 (×5): qty 1

## 2011-07-06 MED ORDER — SODIUM CHLORIDE 0.9 % IV SOLN: INTRAVENOUS | Status: AC

## 2011-07-06 MED ORDER — INSULIN ASPART 100 UNIT/ML ~~LOC~~ SOLN
0.0000 [IU] | Freq: Three times a day (TID) | SUBCUTANEOUS | Status: DC
  Administered 2011-07-06: 2 [IU] via SUBCUTANEOUS
  Administered 2011-07-06: 1 [IU] via SUBCUTANEOUS
  Filled 2011-07-06: qty 3

## 2011-07-06 MED ORDER — SUMATRIPTAN SUCCINATE 50 MG PO TABS
50.0000 mg | ORAL_TABLET | Freq: Once | ORAL | Status: AC
Start: 2011-07-06 — End: 2011-07-06
  Administered 2011-07-06: 50 mg via ORAL
  Filled 2011-07-06: qty 1

## 2011-07-06 MED ORDER — ZOLPIDEM TARTRATE 5 MG PO TABS
10.0000 mg | ORAL_TABLET | Freq: Every evening | ORAL | Status: DC | PRN
  Administered 2011-07-07: 10 mg via ORAL
  Filled 2011-07-06: qty 2

## 2011-07-06 MED ORDER — ASPIRIN 300 MG RE SUPP
300.0000 mg | RECTAL | Status: DC
  Filled 2011-07-06: qty 1

## 2011-07-06 MED ORDER — POTASSIUM CHLORIDE CRYS ER 20 MEQ PO TBCR
20.0000 meq | EXTENDED_RELEASE_TABLET | Freq: Every day | ORAL | Status: DC
  Administered 2011-07-06 – 2011-07-07 (×2): 20 meq via ORAL
  Filled 2011-07-06 (×2): qty 1

## 2011-07-06 MED ORDER — ASPIRIN 81 MG PO CHEW: 324.0000 mg | CHEWABLE_TABLET | ORAL | Status: DC

## 2011-07-06 MED ORDER — SODIUM CHLORIDE 0.9 % IJ SOLN
3.0000 mL | Freq: Two times a day (BID) | INTRAMUSCULAR | Status: DC
  Administered 2011-07-06 – 2011-07-07 (×4): 3 mL via INTRAVENOUS

## 2011-07-06 MED ORDER — DOXYCYCLINE HYCLATE 100 MG PO TABS
100.0000 mg | ORAL_TABLET | Freq: Two times a day (BID) | ORAL | Status: DC
  Administered 2011-07-06 – 2011-07-07 (×4): 100 mg via ORAL
  Filled 2011-07-06 (×5): qty 1

## 2011-07-06 MED ORDER — ALBUTEROL SULFATE (5 MG/ML) 0.5% IN NEBU
2.5000 mg | INHALATION_SOLUTION | Freq: Four times a day (QID) | RESPIRATORY_TRACT | Status: DC
  Administered 2011-07-06: 2.5 mg via RESPIRATORY_TRACT
  Filled 2011-07-06 (×2): qty 0.5

## 2011-07-06 MED ORDER — ASPIRIN EC 81 MG PO TBEC
81.0000 mg | DELAYED_RELEASE_TABLET | Freq: Every day | ORAL | Status: DC
  Filled 2011-07-06: qty 1

## 2011-07-06 MED ORDER — PANTOPRAZOLE SODIUM 40 MG PO TBEC
40.0000 mg | DELAYED_RELEASE_TABLET | Freq: Every day | ORAL | Status: DC
  Administered 2011-07-06 – 2011-07-07 (×2): 40 mg via ORAL
  Filled 2011-07-06 (×2): qty 1

## 2011-07-06 MED ORDER — NITROGLYCERIN IN D5W 200-5 MCG/ML-% IV SOLN
3.0000 ug/min | INTRAVENOUS | Status: DC
  Filled 2011-07-06: qty 250

## 2011-07-06 MED ORDER — TRAMADOL HCL 50 MG PO TABS
50.0000 mg | ORAL_TABLET | Freq: Four times a day (QID) | ORAL | Status: DC | PRN
  Administered 2011-07-06: 50 mg via ORAL
  Filled 2011-07-06: qty 1

## 2011-07-06 MED ORDER — FLUTICASONE PROPIONATE 50 MCG/ACT NA SUSP
1.0000 | Freq: Every day | NASAL | Status: DC
  Administered 2011-07-06 – 2011-07-07 (×2): 1 via NASAL
  Filled 2011-07-06: qty 16

## 2011-07-06 MED ORDER — NITROGLYCERIN 0.4 MG SL SUBL: 0.4000 mg | SUBLINGUAL_TABLET | SUBLINGUAL | Status: DC | PRN

## 2011-07-06 MED ORDER — CLOPIDOGREL BISULFATE 75 MG PO TABS
75.0000 mg | ORAL_TABLET | Freq: Every day | ORAL | Status: DC
  Administered 2011-07-06 – 2011-07-07 (×2): 75 mg via ORAL
  Filled 2011-07-06 (×3): qty 1

## 2011-07-06 MED ORDER — ALBUTEROL SULFATE (5 MG/ML) 0.5% IN NEBU: 2.5000 mg | INHALATION_SOLUTION | RESPIRATORY_TRACT | Status: DC | PRN

## 2011-07-06 MED ORDER — ASPIRIN EC 81 MG PO TBEC
81.0000 mg | DELAYED_RELEASE_TABLET | Freq: Every day | ORAL | Status: DC
  Administered 2011-07-07: 81 mg via ORAL
  Filled 2011-07-06: qty 1

## 2011-07-06 MED ORDER — FLUCONAZOLE 100 MG PO TABS
100.0000 mg | ORAL_TABLET | Freq: Every day | ORAL | Status: DC
  Administered 2011-07-06 – 2011-07-07 (×2): 100 mg via ORAL
  Filled 2011-07-06 (×2): qty 1

## 2011-07-06 MED ORDER — ONDANSETRON HCL 4 MG/2ML IJ SOLN: 4.0000 mg | Freq: Four times a day (QID) | INTRAMUSCULAR | Status: DC | PRN

## 2011-07-06 NOTE — Progress Notes (Signed)
ANTICOAGULATION CONSULT NOTE - Initial Consult  Pharmacy Consult for heparin Indication: chest pain/ACS  Allergies  Allergen Reactions  . Adhesive (Tape)   . Iodine Swelling  . Metformin And Related Diarrhea    Patient Measurements: Height: 5\' 4"  (162.6 cm) Weight: 142 lb 10.2 oz (64.7 kg) IBW/kg (Calculated) : 54.7   Vital Signs: Temp: 97.3 F (36.3 C) (03/02 0500) Temp src: Oral (03/02 0500) BP: 105/75 mmHg (03/02 0500) Pulse Rate: 58  (03/02 0500)  Labs:  Basename 07/06/11 0500 07/06/11 0214 07/05/11 1846  HGB 11.5* -- 14.1  HCT 35.0* -- 42.2  PLT 191 -- 234  APTT -- -- 30  LABPROT 14.2 -- 14.2  INR 1.08 -- 1.08  HEPARINUNFRC 0.50 -- --  CREATININE 0.70 -- 0.93  CKTOTAL -- 69 93  CKMB -- 1.8 1.5  TROPONINI -- <0.30 --   Estimated Creatinine Clearance: 67.8 ml/min (by C-G formula based on Cr of 0.7).  Medical History: Past Medical History  Diagnosis Date  . Diabetes mellitus   . Hypertension   . Stroke   . Anxiety   . Heart attack   . Hyperlipidemia   . GERD (gastroesophageal reflux disease)   . Irritable bowel syndrome   . Chronic headaches   . Sinus problem   . Hidradenitis     groin    Medications:  Prescriptions prior to admission  Medication Sig Dispense Refill  . alprazolam (XANAX) 2 MG tablet Take 2 mg by mouth as needed. FOR ANXIETY      . aspirin EC 81 MG tablet Take 81 mg by mouth daily.      . clopidogrel (PLAVIX) 75 MG tablet Take 75 mg by mouth daily.        Marland Kitchen doxycycline (VIBRA-TABS) 100 MG tablet Take 1 tablet (100 mg total) by mouth 2 (two) times daily.  14 tablet  2  . esomeprazole (NEXIUM) 40 MG capsule Take 40 mg by mouth daily before breakfast.        . fluconazole (DIFLUCAN) 100 MG tablet Take 1 tablet (100 mg total) by mouth daily.  2 tablet  0  . glimepiride (AMARYL) 4 MG tablet Take 4 mg by mouth 2 (two) times daily.       . Linagliptin (TRADJENTA PO) Take by mouth daily.      . mometasone (NASONEX) 50 MCG/ACT nasal spray  Place 2 sprays into the nose as needed.        . nitroGLYCERIN (NITRODUR - DOSED IN MG/24 HR) 0.4 mg/hr Place 1 patch onto the skin daily.      . nitroGLYCERIN (NITROSTAT) 0.4 MG SL tablet Place 0.4 mg under the tongue every 5 (five) minutes as needed.      . Olmesartan-Amlodipine-HCTZ (TRIBENZOR) 40-5-12.5 MG TABS Take 1 tablet by mouth daily.      . Potassium Chloride Crys CR (KLOR-CON M20 PO) Take 1 capsule by mouth daily.        . rosuvastatin (CRESTOR) 20 MG tablet Take 20 mg by mouth daily.      . traMADol (ULTRAM) 50 MG tablet Take 50 mg by mouth every 6 (six) hours as needed. FOR PAIN      . zolpidem (AMBIEN) 10 MG tablet Take 10 mg by mouth at bedtime as needed. FOR SLEEP      . ibuprofen (ADVIL,MOTRIN) 800 MG tablet Take 800 mg by mouth every 8 (eight) hours as needed. FOR PAIN       Scheduled:     .  sodium chloride   Intravenous STAT  . aspirin  324 mg Oral Once  . aspirin EC  81 mg Oral Daily  . atorvastatin  10 mg Oral q1800  . clopidogrel  75 mg Oral Q breakfast  . doxycycline  100 mg Oral BID  . fluconazole  100 mg Oral Daily  . fluticasone  1 spray Each Nare Daily  . glimepiride  4 mg Oral BID WC  . heparin  4,000 Units Intravenous Once  . insulin aspart  0-9 Units Subcutaneous TID WC  . linagliptin  5 mg Oral Daily  . ondansetron  4 mg Intravenous Once  . pantoprazole  40 mg Oral Daily  . potassium chloride SA  20 mEq Oral Daily  . sodium chloride  1,000 mL Intravenous Once  . sodium chloride  3 mL Intravenous Q12H  . DISCONTD: aspirin  324 mg Oral NOW  . DISCONTD: aspirin EC  81 mg Oral Daily  . DISCONTD: aspirin  300 mg Rectal NOW   Infusions:     . sodium chloride 125 mL/hr at 07/05/11 1935  . heparin 1,000 Units/hr (07/05/11 2123)  . nitroGLYCERIN    . DISCONTD: nitroGLYCERIN 5 mcg/min (07/05/11 2048)    Assessment: Heparin level #1 therapuetic at 0.50 no bleeding reported. Slight drop in h/h will re-check with confirmatory HL   Goal of Therapy:    Heparin level 0.3-0.7 units/ml   Plan:  Repeat HL and CBC at noon Janice Coffin PharmD BCPS 07/06/2011,6:50 AM

## 2011-07-06 NOTE — Progress Notes (Signed)
ANTICOAGULATION CONSULT NOTE - Initial Consult  Pharmacy Consult for Heparin Indication: chest pain/ACS   Allergies  Allergen Reactions  . Adhesive (Tape)   . Iodine Swelling  . Metformin And Related Diarrhea    Patient Measurements: Height: 5\' 4"  (162.6 cm) Weight: 142 lb 10.2 oz (64.7 kg) IBW/kg (Calculated) : 54.7   Vital Signs: Temp: 97.7 F (36.5 C) (03/02 1230) Temp src: Oral (03/02 1230) BP: 109/85 mmHg (03/02 1230) Pulse Rate: 67  (03/02 1230)  Labs:  Basename 07/06/11 1141 07/06/11 0827 07/06/11 0500 07/06/11 0214 07/05/11 1846  HGB -- -- 11.5* -- 14.1  HCT -- -- 35.0* -- 42.2  PLT -- -- 191 -- 234  APTT -- -- -- -- 30  LABPROT -- -- 14.2 -- 14.2  INR -- -- 1.08 -- 1.08  HEPARINUNFRC 0.59 -- 0.50 -- --  CREATININE -- -- 0.70 -- 0.93  CKTOTAL -- 66 -- 69 93  CKMB -- 1.8 -- 1.8 1.5  TROPONINI -- <0.30 -- <0.30 --   Estimated Creatinine Clearance: 67.8 ml/min (by C-G formula based on Cr of 0.7).  Medical History: Past Medical History  Diagnosis Date  . Diabetes mellitus   . Hypertension   . Stroke   . Anxiety   . Heart attack   . Hyperlipidemia   . GERD (gastroesophageal reflux disease)   . Irritable bowel syndrome   . Chronic headaches   . Sinus problem   . Hidradenitis     groin    Medications:  Prescriptions prior to admission  Medication Sig Dispense Refill  . alprazolam (XANAX) 2 MG tablet Take 2 mg by mouth as needed. FOR ANXIETY      . aspirin EC 81 MG tablet Take 81 mg by mouth daily.      . clopidogrel (PLAVIX) 75 MG tablet Take 75 mg by mouth daily.        Marland Kitchen doxycycline (VIBRA-TABS) 100 MG tablet Take 1 tablet (100 mg total) by mouth 2 (two) times daily.  14 tablet  2  . esomeprazole (NEXIUM) 40 MG capsule Take 40 mg by mouth daily before breakfast.        . fluconazole (DIFLUCAN) 100 MG tablet Take 1 tablet (100 mg total) by mouth daily.  2 tablet  0  . glimepiride (AMARYL) 4 MG tablet Take 4 mg by mouth 2 (two) times daily.         . Linagliptin (TRADJENTA PO) Take by mouth daily.      . mometasone (NASONEX) 50 MCG/ACT nasal spray Place 2 sprays into the nose as needed.        . nitroGLYCERIN (NITRODUR - DOSED IN MG/24 HR) 0.4 mg/hr Place 1 patch onto the skin daily.      . nitroGLYCERIN (NITROSTAT) 0.4 MG SL tablet Place 0.4 mg under the tongue every 5 (five) minutes as needed.      . Olmesartan-Amlodipine-HCTZ (TRIBENZOR) 40-5-12.5 MG TABS Take 1 tablet by mouth daily.      . Potassium Chloride Crys CR (KLOR-CON M20 PO) Take 1 capsule by mouth daily.        . rosuvastatin (CRESTOR) 20 MG tablet Take 20 mg by mouth daily.      . traMADol (ULTRAM) 50 MG tablet Take 50 mg by mouth every 6 (six) hours as needed. FOR PAIN      . zolpidem (AMBIEN) 10 MG tablet Take 10 mg by mouth at bedtime as needed. FOR SLEEP      . ibuprofen (ADVIL,MOTRIN)  800 MG tablet Take 800 mg by mouth every 8 (eight) hours as needed. FOR PAIN       Scheduled:     . sodium chloride   Intravenous STAT  . albuterol  2.5 mg Nebulization Q6H  . aspirin  324 mg Oral Once  . aspirin EC  81 mg Oral Daily  . atorvastatin  10 mg Oral q1800  . clopidogrel  75 mg Oral Q breakfast  . doxycycline  100 mg Oral BID  . fluconazole  100 mg Oral Daily  . fluticasone  1 spray Each Nare Daily  . glimepiride  4 mg Oral BID WC  . heparin  4,000 Units Intravenous Once  . insulin aspart  0-9 Units Subcutaneous TID WC  . linagliptin  5 mg Oral Daily  . ondansetron  4 mg Intravenous Once  . pantoprazole  40 mg Oral Daily  . potassium chloride SA  20 mEq Oral Daily  . sodium chloride  1,000 mL Intravenous Once  . sodium chloride  3 mL Intravenous Q12H  . DISCONTD: aspirin  324 mg Oral NOW  . DISCONTD: aspirin EC  81 mg Oral Daily  . DISCONTD: aspirin  300 mg Rectal NOW   Infusions:     . sodium chloride 125 mL/hr at 07/06/11 1045  . heparin 1,000 Units/hr (07/06/11 1000)  . nitroGLYCERIN 10 mcg/min (07/06/11 0150)  . DISCONTD: nitroGLYCERIN 5 mcg/min  (07/05/11 2048)   Assessment: Repeat heparin level = 0.59, essentially unchanged on IV Heparin rate of 1000 units/hr.  There is no bleeding complications reported. Some drop in her H/H since admit but may be dilutional.   Goal of Therapy:  Heparin level 0.3-0.7 units/ml   Plan:  Continue IV heparin at 1000 units/hr. F/u AM heparin level and adjust as needed. Monitor for s/s of bleeding.  Nadara Mustard, PharmD. MS Clinical Pharmacist Pager:  [phone] 07/06/2011,2:12 PM

## 2011-07-06 NOTE — Progress Notes (Signed)
ANTICOAGULATION CONSULT NOTE - Initial Consult  Pharmacy Consult for heparin Indication: chest pain/ACS  Allergies  Allergen Reactions  . Adhesive (Tape)   . Iodine Swelling  . Metformin And Related Diarrhea    Patient Measurements: Height: 5\' 4"  (162.6 cm) Weight: 142 lb 10.2 oz (64.7 kg) IBW/kg (Calculated) : 54.7   Vital Signs: Temp: 97.9 F (36.6 C) (03/02 0137) Temp src: Oral (03/02 0137) BP: 108/60 mmHg (03/02 0137) Pulse Rate: 60  (03/02 0137)  Labs:  Basename 07/05/11 1846  HGB 14.1  HCT 42.2  PLT 234  APTT 30  LABPROT 14.2  INR 1.08  HEPARINUNFRC --  CREATININE 0.93  CKTOTAL 93  CKMB 1.5  TROPONINI --   Estimated Creatinine Clearance: 58.3 ml/min (by C-G formula based on Cr of 0.93).  Medical History: Past Medical History  Diagnosis Date  . Diabetes mellitus   . Hypertension   . Stroke   . Anxiety   . Heart attack   . Hyperlipidemia   . GERD (gastroesophageal reflux disease)   . Irritable bowel syndrome   . Chronic headaches   . Sinus problem   . Hidradenitis     groin    Medications:  Prescriptions prior to admission  Medication Sig Dispense Refill  . alprazolam (XANAX) 2 MG tablet Take 2 mg by mouth as needed. FOR ANXIETY      . aspirin EC 81 MG tablet Take 81 mg by mouth daily.      . clopidogrel (PLAVIX) 75 MG tablet Take 75 mg by mouth daily.        Marland Kitchen doxycycline (VIBRA-TABS) 100 MG tablet Take 1 tablet (100 mg total) by mouth 2 (two) times daily.  14 tablet  2  . esomeprazole (NEXIUM) 40 MG capsule Take 40 mg by mouth daily before breakfast.        . fluconazole (DIFLUCAN) 100 MG tablet Take 1 tablet (100 mg total) by mouth daily.  2 tablet  0  . glimepiride (AMARYL) 4 MG tablet Take 4 mg by mouth 2 (two) times daily.       . Linagliptin (TRADJENTA PO) Take by mouth daily.      . mometasone (NASONEX) 50 MCG/ACT nasal spray Place 2 sprays into the nose as needed.        . nitroGLYCERIN (NITRODUR - DOSED IN MG/24 HR) 0.4 mg/hr Place 1  patch onto the skin daily.      . nitroGLYCERIN (NITROSTAT) 0.4 MG SL tablet Place 0.4 mg under the tongue every 5 (five) minutes as needed.      . Olmesartan-Amlodipine-HCTZ (TRIBENZOR) 40-5-12.5 MG TABS Take 1 tablet by mouth daily.      . Potassium Chloride Crys CR (KLOR-CON M20 PO) Take 1 capsule by mouth daily.        . rosuvastatin (CRESTOR) 20 MG tablet Take 20 mg by mouth daily.      . traMADol (ULTRAM) 50 MG tablet Take 50 mg by mouth every 6 (six) hours as needed. FOR PAIN      . zolpidem (AMBIEN) 10 MG tablet Take 10 mg by mouth at bedtime as needed. FOR SLEEP      . ibuprofen (ADVIL,MOTRIN) 800 MG tablet Take 800 mg by mouth every 8 (eight) hours as needed. FOR PAIN       Scheduled:    . aspirin  324 mg Oral Once  . aspirin  324 mg Oral NOW   Or  . aspirin  300 mg Rectal NOW  .  aspirin EC  81 mg Oral Daily  . atorvastatin  10 mg Oral q1800  . clopidogrel  75 mg Oral Q breakfast  . heparin  4,000 Units Intravenous Once  . ondansetron  4 mg Intravenous Once  . sodium chloride  1,000 mL Intravenous Once  . sodium chloride  3 mL Intravenous Q12H   Infusions:    . sodium chloride 125 mL/hr at 07/05/11 1935  . heparin 1,000 Units/hr (07/05/11 2123)  . nitroGLYCERIN 5 mcg/min (07/05/11 2048)  . nitroGLYCERIN      Assessment: 56yo female c/o CP, has PMH including CAD, begun on heparin at [facility], to continue during admission.  Goal of Therapy:  Heparin level 0.3-0.7 units/ml   Plan:  Given heparin 4000 unit bolus followed by gtt at 1000 units/hr at [facility]  Will continue for now and monitor heparin levels and CBC.  Colleen Can PharmD BCPS 07/06/2011,2:23 AM

## 2011-07-06 NOTE — H&P (Signed)
[name] is an 56 y.o. female.   Chief Complaint: Chest pain HPI: 55 years old black female with recent loss of family member has retrosternal, pressure type non-radiating chest pain yesterday with partial relief from NTG use. Patient has multiple cardiac risk factors but recent nuclear stress test was negative for reversible ischemia.  Past Medical History  Diagnosis Date  . Diabetes mellitus   . Hypertension   . Stroke   . Anxiety   . Heart attack   . Hyperlipidemia   . GERD (gastroesophageal reflux disease)   . Irritable bowel syndrome   . Chronic headaches   . Sinus problem   . Hidradenitis     groin      Past Surgical History  Procedure Date  . Hiatal hernia repair   . Breast surgery 2007, 2008  . Breast reduction surgery   . Axillary hidradenitis excision 1990-2008    bilateral  . Excision hidradenitis groin and abdomen 2012  . Back surgery   . Cervical disc surgery     Family History  Problem Relation Age of Onset  . Cancer Mother     breast  . Heart disease Mother   . Hypertension Mother   . Diabetes Father   . Heart disease Father   . Stroke Father   . Heart disease Sister   . Hypertension Sister   . Hypertension Sister   . Hyperlipidemia Sister   . Diabetes Sister    Social History:  reports that she has been smoking.  She does not have any smokeless tobacco history on file. She reports that she does not drink alcohol or use illicit drugs.  Allergies:  Allergies  Allergen Reactions  . Adhesive (Tape)   . Iodine Swelling  . Metformin And Related Diarrhea    Medications Prior to Admission  Medication Dose Route Frequency Provider Last Rate Last Dose  . 0.9 %  sodium chloride infusion   Intravenous Continuous Felisa Bonier, MD 125 mL/hr at 07/05/11 1935    . 0.9 %  sodium chloride infusion  250 mL Intravenous PRN Ricki Rodriguez, MD      . acetaminophen (TYLENOL) tablet 650 mg  650 mg Oral Q4H PRN Ricki Rodriguez, MD      . ALPRAZolam  Prudy Feeler) tablet 0.25 mg  0.25 mg Oral BID PRN Ricki Rodriguez, MD      . aspirin chewable tablet 324 mg  324 mg Oral Once Felisa Bonier, MD   324 mg at 07/05/11 1902  . aspirin chewable tablet 324 mg  324 mg Oral NOW Ricki Rodriguez, MD       Or  . aspirin suppository 300 mg  300 mg Rectal NOW Ricki Rodriguez, MD      . aspirin EC tablet 81 mg  81 mg Oral Daily Ricki Rodriguez, MD      . atorvastatin (LIPITOR) tablet 10 mg  10 mg Oral q1800 Ricki Rodriguez, MD      . clopidogrel (PLAVIX) tablet 75 mg  75 mg Oral Q breakfast Ricki Rodriguez, MD      . heparin ADULT infusion 100 units/mL (25000 units/250 mL)  1,000 Units/hr Intravenous Continuous Felisa Bonier, MD 10 mL/hr at 07/05/11 2123 1,000 Units/hr at 07/05/11 2123  . heparin bolus via infusion 4,000 Units  4,000 Units Intravenous Once Felisa Bonier, MD   4,000 Units at 07/05/11 2100  . nitroGLYCERIN (NITROSTAT) SL tablet 0.4 mg  0.4  mg Sublingual Q5 min PRN Felisa Bonier, MD   0.4 mg at 07/05/11 2018  . nitroGLYCERIN (NITROSTAT) SL tablet 0.4 mg  0.4 mg Sublingual Q5 Min x 3 PRN Ricki Rodriguez, MD      . nitroGLYCERIN 0.2 mg/mL in dextrose 5 % infusion  5 mcg/min Intravenous Titrated Felisa Bonier, MD 1.5 mL/hr at 07/05/11 2048 5 mcg/min at 07/05/11 2048  . nitroGLYCERIN 0.2 mg/mL in dextrose 5 % infusion  3-30 mcg/min Intravenous Titrated Ricki Rodriguez, MD      . ondansetron [place] [facility]) injection 4 mg  4 mg Intravenous Once Felisa Bonier, MD   4 mg at 07/05/11 2016  . ondansetron (ZOFRAN) injection 4 mg  4 mg Intravenous Q6H PRN Ricki Rodriguez, MD      . sodium chloride 0.9 % bolus 1,000 mL  1,000 mL Intravenous Once Felisa Bonier, MD   1,000 mL at 07/05/11 1935  . sodium chloride 0.9 % injection 3 mL  3 mL Intravenous Q12H Ricki Rodriguez, MD      . sodium chloride 0.9 % injection 3 mL  3 mL Intravenous PRN Ricki Rodriguez, MD       Medications Prior to Admission  Medication Sig Dispense Refill  . alprazolam (XANAX) 2 MG tablet  Take 2 mg by mouth as needed. FOR ANXIETY      . clopidogrel (PLAVIX) 75 MG tablet Take 75 mg by mouth daily.        Marland Kitchen doxycycline (VIBRA-TABS) 100 MG tablet Take 1 tablet (100 mg total) by mouth 2 (two) times daily.  14 tablet  2  . esomeprazole (NEXIUM) 40 MG capsule Take 40 mg by mouth daily before breakfast.        . fluconazole (DIFLUCAN) 100 MG tablet Take 1 tablet (100 mg total) by mouth daily.  2 tablet  0  . glimepiride (AMARYL) 4 MG tablet Take 4 mg by mouth 2 (two) times daily.       . Linagliptin (TRADJENTA PO) Take by mouth daily.      . mometasone (NASONEX) 50 MCG/ACT nasal spray Place 2 sprays into the nose as needed.        . nitroGLYCERIN (NITRODUR - DOSED IN MG/24 HR) 0.4 mg/hr Place 1 patch onto the skin daily.      . nitroGLYCERIN (NITROSTAT) 0.4 MG SL tablet Place 0.4 mg under the tongue every 5 (five) minutes as needed.      . Potassium Chloride Crys CR (KLOR-CON M20 PO) Take 1 capsule by mouth daily.        . traMADol (ULTRAM) 50 MG tablet Take 50 mg by mouth every 6 (six) hours as needed. FOR PAIN      . zolpidem (AMBIEN) 10 MG tablet Take 10 mg by mouth at bedtime as needed. FOR SLEEP      . ibuprofen (ADVIL,MOTRIN) 800 MG tablet Take 800 mg by mouth every 8 (eight) hours as needed. FOR PAIN        Results for orders placed during the hospital encounter of 07/05/11 (from the past 48 hour(s))  CBC     Status: Normal   Collection Time   07/05/11  6:46 PM      Component Value Range Comment   WBC 6.6  4.0 - 10.5 (K/uL)    RBC 5.06  3.87 - 5.11 (MIL/uL)    Hemoglobin 14.1  12.0 - 15.0 (g/dL)    HCT 14.7  82.9 - 56.2 (%)  MCV 83.4  78.0 - 100.0 (fL)    MCH 27.9  26.0 - 34.0 (pg)    MCHC 33.4  30.0 - 36.0 (g/dL)    RDW 45.4  09.8 - 11.9 (%)    Platelets 234  150 - 400 (K/uL)   DIFFERENTIAL     Status: Abnormal   Collection Time   07/05/11  6:46 PM      Component Value Range Comment   Neutrophils Relative 38 (*) 43 - 77 (%)    Neutro Abs 2.5  1.7 - 7.7 (K/uL)     Lymphocytes Relative 55 (*) 12 - 46 (%)    Lymphs Abs 3.6  0.7 - 4.0 (K/uL)    Monocytes Relative 6  3 - 12 (%)    Monocytes Absolute 0.4  0.1 - 1.0 (K/uL)    Eosinophils Relative 1  0 - 5 (%)    Eosinophils Absolute 0.0  0.0 - 0.7 (K/uL)    Basophils Relative 1  0 - 1 (%)    Basophils Absolute 0.0  0.0 - 0.1 (K/uL)   BASIC METABOLIC PANEL     Status: Abnormal   Collection Time   07/05/11  6:46 PM      Component Value Range Comment   Sodium 140  135 - 145 (mEq/L)    Potassium 3.7  3.5 - 5.1 (mEq/L)    Chloride 101  96 - 112 (mEq/L)    CO2 27  19 - 32 (mEq/L)    Glucose, Bld 170 (*) 70 - 99 (mg/dL)    BUN 15  6 - 23 (mg/dL)    Creatinine, Ser 1.47  0.50 - 1.10 (mg/dL)    Calcium 82.9  8.4 - 10.5 (mg/dL)    GFR calc non Af Amer 67 (*) >90 (mL/min)    GFR calc Af Amer 78 (*) >90 (mL/min)   CK TOTAL AND CKMB     Status: Normal   Collection Time   07/05/11  6:46 PM      Component Value Range Comment   Total CK 93  7 - 177 (U/L)    CK, MB 1.5  0.3 - 4.0 (ng/mL)    Relative Index RELATIVE INDEX IS INVALID  0.0 - 2.5    PROTIME-INR     Status: Normal   Collection Time   07/05/11  6:46 PM      Component Value Range Comment   Prothrombin Time 14.2  11.6 - 15.2 (seconds)    INR 1.08  0.00 - 1.49    APTT     Status: Normal   Collection Time   07/05/11  6:46 PM      Component Value Range Comment   aPTT 30  24 - 37 (seconds)   POCT I-STAT TROPONIN I     Status: Normal   Collection Time   07/05/11  6:58 PM      Component Value Range Comment   Troponin i, poc 0.00  0.00 - 0.08 (ng/mL)    Comment 3             Dg Chest Port 1 View  07/05/2011  *RADIOLOGY REPORT*  Clinical Data: chest pain  PORTABLE CHEST - 1 VIEW  Comparison: 02/26/2011  Findings: The heart size and mediastinal contours are within normal limits.  Both lungs are clear.  The visualized skeletal structures are unremarkable.  IMPRESSION: No active cardiopulmonary abnormalities.  Original Report Authenticated By: Rosealee Albee,  M.D.    @ROS @ Constitutional: Positive for  diaphoresis. Negative for fever, chills, activity change, appetite change. + fatigue.  HENT: Negative for congestion, facial swelling, neck pain, neck stiffness.  Eyes: Wears glasses.  Respiratory: Positive for shortness of breath. Negative for cough, choking, wheezing. +Dyspnea on exertion  Cardiovascular: Positive for chest pain. Negative for palpitations, leg swelling and syncope.  Gastrointestinal: Positive for nausea. Negative for vomiting, abdominal pain and abdominal distention.  Musculoskeletal: Negative.  Skin: Negative.  Neurological: Negative for dizziness, syncope, speech difficulty, light-headedness, numbness and headaches.  Hematological: Does not bruise/bleed easily.  Psychiatric/Behavioral: Negative. Negative for altered mental status.   Blood pressure 108/60, pulse 60, temperature 97.9 F (36.6 C), temperature source Oral, resp. rate 20, height 5\' 4"  (1.626 m), weight 64.7 kg (142 lb 10.2 oz), SpO2 98.00%. Constitutional: She is oriented to person, place, and time. She appears well-developed and averagly-nourished. No distress.  HENT: Head: Normocephalic and atraumatic. Oropharynx is clear and moist. Brown eyes: EOM are normal. Pupils are equal, round, and reactive to light.  Neck: Normal range of motion. Neck supple. No JVD present. No tracheal deviation present.  Cardiovascular: Normal rate, regular rhythm, normal heart sounds and intact distal pulses. No extrasystoles are present. Exam reveals no gallop, no S3, no S4 and no friction rub. No murmur heard.  Pulmonary/Chest: Breath sounds normal. No accessory muscle usage or stridor. Not tachypneic. No respiratory distress. She has no wheezes. She has no rales. She exhibits point tenderness over lower sternum and parasternal areas.  Abdominal: Soft. Bowel sounds are normal. She exhibits no distension.  Musculoskeletal: Normal range of motion. She exhibits no edema and no tenderness.    Neurological: She is alert and oriented to person, place, and time. She has normal reflexes. No cranial nerve deficit. Coordination normal.  Skin: Skin is warm and dry. No rash noted. No erythema. No pallor.  Psychiatric: She has a normal mood and affect. Her behavior is normal. Judgment and thought content normal.    Assessment Chest pain, probably musculoskeletal DM, II Hypertension Anxiety Hyperlipidemia IBS  Plan R/O MI Home medications. IV NTG and heparin  Cressida Milford S 07/06/2011, 2:07 AM

## 2011-07-07 ENCOUNTER — Inpatient Hospital Stay (HOSPITAL_COMMUNITY): Payer: Medicare Other

## 2011-07-07 LAB — CBC
HCT: 36.9 % (ref 36.0–46.0)
Hemoglobin: 12.3 g/dL (ref 12.0–15.0)
MCH: 28.1 pg (ref 26.0–34.0)
MCHC: 33.3 g/dL (ref 30.0–36.0)
MCV: 84.2 fL (ref 78.0–100.0)
Platelets: 175 10*3/uL (ref 150–400)
RBC: 4.38 MIL/uL (ref 3.87–5.11)
RDW: 12.8 % (ref 11.5–15.5)
WBC: 4.9 10*3/uL (ref 4.0–10.5)

## 2011-07-07 LAB — HEPARIN LEVEL (UNFRACTIONATED): Heparin Unfractionated: 0.39 IU/mL (ref 0.30–0.70)

## 2011-07-07 LAB — BASIC METABOLIC PANEL
BUN: 11 mg/dL (ref 6–23)
CO2: 24 mEq/L (ref 19–32)
Calcium: 9.6 mg/dL (ref 8.4–10.5)
Chloride: 110 mEq/L (ref 96–112)
Creatinine, Ser: 0.64 mg/dL (ref 0.50–1.10)
GFR calc Af Amer: >90 mL/min (ref >90)
GFR calc non Af Amer: >90 mL/min (ref >90)
Glucose, Bld: 163 mg/dL — ABNORMAL HIGH (ref 70–99)
Potassium: 3.8 mEq/L (ref 3.5–5.1)
Sodium: 142 mEq/L (ref 135–145)

## 2011-07-07 LAB — GLUCOSE, CAPILLARY
Glucose-Capillary: 154 mg/dL — ABNORMAL HIGH (ref 70–99)
Glucose-Capillary: 144 mg/dL — ABNORMAL HIGH (ref 70–99)
Glucose-Capillary: 166 mg/dL — ABNORMAL HIGH (ref 70–99)

## 2011-07-07 MED ORDER — TECHNETIUM TC 99M TETROFOSMIN IV KIT
30.0000 | PACK | Freq: Once | INTRAVENOUS | Status: AC | PRN
  Administered 2011-07-07: 30 via INTRAVENOUS

## 2011-07-07 MED ORDER — TECHNETIUM TC 99M TETROFOSMIN IV KIT
10.0000 | PACK | Freq: Once | INTRAVENOUS | Status: AC | PRN
  Administered 2011-07-07: 10 via INTRAVENOUS

## 2011-07-07 MED ORDER — REGADENOSON 0.4 MG/5ML IV SOLN
0.4000 mg | Freq: Once | INTRAVENOUS | Status: AC
  Administered 2011-07-07: 0.4 mg via INTRAVENOUS

## 2011-07-07 MED ORDER — INSULIN ASPART 100 UNIT/ML ~~LOC~~ SOLN: 0.0000 [IU] | SUBCUTANEOUS | Status: DC

## 2011-07-07 NOTE — Discharge Summary (Signed)
Physician Discharge Summary  Patient ID: [name] MRN: [id number] DOB/AGE: June 26, 1955 56 y.o.  Admit date: 07/05/2011 Discharge date: 07/07/2011  Admission Diagnoses: Chest pain, probably musculoskeletal  DM, II  Hypertension  Anxiety  Hyperlipidemia  IBS  Discharge Diagnoses:  Principal Problem:  *Chest pain at rest Active Problems:  DIABETES MELLITUS  HYPERTENSION  DYSLIPIDEMIA  Anxiety  IBS  Discharged Condition: good  Hospital Course: 56 years old black female with one day history of chest pain, pressure type, non-radiating and somewhat reproducible by palpation. Patient has multiple risk factors for CAD. She underwent nuclear stress test with no reversible ischemia.  Consults: cardiology  Significant Diagnostic Studies: labs: Essentially unremarkable with borderline Hgb level of 12.3 mg. and nuclear medicine: Good EF and no reversible ischemia.  Treatments: analgesia: acetaminophen and tramadol and anticoagulation: heparin  Discharge Exam: Blood pressure 136/79, pulse 71, temperature 98.3 F (36.8 C), temperature source Oral, resp. rate 19, height 5\' 4"  (1.626 m), weight 66.5 kg (146 lb 9.7 oz), SpO2 97.00%. Constitutional: She is oriented to person, place, and time. She appears well-developed and averagly-nourished. No distress.  HENT: Head: Normocephalic and atraumatic. Oropharynx is clear and moist. Brown eyes: EOM are normal. Pupils are equal, round, and reactive to light.  Neck: Normal range of motion. Neck supple. No JVD present. No tracheal deviation present.  Cardiovascular: Normal rate, regular rhythm, normal heart sounds and intact distal pulses. No extrasystoles are present. Exam reveals no gallop, no S3, no S4 and no friction rub. No murmur heard.  Pulmonary/Chest: Breath sounds normal. No accessory muscle usage or stridor. Not tachypneic. No respiratory distress. She has no wheezes. She has no rales. She exhibits point tenderness over lower sternum and  parasternal areas.  Abdominal: Soft. Bowel sounds are normal. She exhibits no distension.  Musculoskeletal: Normal range of motion. She exhibits no edema and no tenderness. Left heel callus formation Neurological: She is alert and oriented to person, place, and time. She has normal reflexes. No cranial nerve deficit. Coordination normal.  Skin: Skin is warm and dry. No rash noted. No erythema. No pallor.  Psychiatric: She has a normal mood and affect. Her behavior is normal. Judgment and thought content normal.     Disposition: 01-Home or Self Care   Medication List  As of 07/07/2011 12:13 PM   TAKE these medications         alprazolam 2 MG tablet   Commonly known as: XANAX   Take 2 mg by mouth as needed. FOR ANXIETY      aspirin EC 81 MG tablet   Take 81 mg by mouth daily.      clopidogrel 75 MG tablet   Commonly known as: PLAVIX   Take 75 mg by mouth daily.      doxycycline 100 MG tablet   Commonly known as: VIBRA-TABS   Take 1 tablet (100 mg total) by mouth 2 (two) times daily.      esomeprazole 40 MG capsule   Commonly known as: NEXIUM   Take 40 mg by mouth daily before breakfast.      fluconazole 100 MG tablet   Commonly known as: DIFLUCAN   Take 1 tablet (100 mg total) by mouth daily.      glimepiride 4 MG tablet   Commonly known as: AMARYL   Take 4 mg by mouth 2 (two) times daily.      ibuprofen 800 MG tablet   Commonly known as: ADVIL,MOTRIN   Take 800 mg by mouth  every 8 (eight) hours as needed. FOR PAIN      KLOR-CON M20 PO   Take 1 capsule by mouth daily.      mometasone 50 MCG/ACT nasal spray   Commonly known as: NASONEX   Place 2 sprays into the nose as needed.      nitroGLYCERIN 0.4 mg/hr   Commonly known as: NITRODUR - Dosed in mg/24 hr   Place 1 patch onto the skin daily.      nitroGLYCERIN 0.4 MG SL tablet   Commonly known as: NITROSTAT   Place 0.4 mg under the tongue every 5 (five) minutes as needed.      rosuvastatin 20 MG tablet    Commonly known as: CRESTOR   Take 20 mg by mouth daily.      TRADJENTA PO   Take by mouth daily.      traMADol 50 MG tablet   Commonly known as: ULTRAM   Take 50 mg by mouth every 6 (six) hours as needed. FOR PAIN      TRIBENZOR 40-5-12.5 MG Tabs   Generic drug: Olmesartan-Amlodipine-HCTZ   Take 1 tablet by mouth daily.      zolpidem 10 MG tablet   Commonly known as: AMBIEN   Take 10 mg by mouth at bedtime as needed. FOR SLEEP           Follow-up Information    Follow up with [facility] S, MD. Schedule an appointment as soon as possible for a visit in 4 weeks.   Contact information:   [address] [place] [address] [phone]          Signed: Ricki Rodriguez 07/07/2011, 12:13 PM

## 2011-07-08 MED FILL — Regadenoson IV Inj 0.4 MG/5ML (0.08 MG/ML): INTRAVENOUS | Qty: 5 | Status: AC

## 2011-08-05 DIAGNOSIS — K5731 Diverticulosis of large intestine without perforation or abscess with bleeding: Secondary | ICD-10-CM

## 2011-08-05 HISTORY — DX: Diverticulosis of large intestine without perforation or abscess with bleeding: K57.31

## 2011-08-10 ENCOUNTER — Encounter (HOSPITAL_COMMUNITY): Payer: Self-pay | Admitting: Nurse Practitioner

## 2011-08-10 ENCOUNTER — Inpatient Hospital Stay (HOSPITAL_COMMUNITY): Payer: Medicare Other

## 2011-08-10 ENCOUNTER — Emergency Department (HOSPITAL_COMMUNITY): Payer: Medicare Other

## 2011-08-10 ENCOUNTER — Inpatient Hospital Stay (HOSPITAL_COMMUNITY)
Admission: EM | Admit: 2011-08-10 | Discharge: 2011-08-16 | DRG: 378 | Disposition: A | Discharge status: Home / Self Care | Payer: Medicare Other | Attending: Cardiovascular Disease | Admitting: Cardiovascular Disease

## 2011-08-10 DIAGNOSIS — I10 Essential (primary) hypertension: Secondary | ICD-10-CM | POA: Diagnosis present

## 2011-08-10 DIAGNOSIS — E7800 Pure hypercholesterolemia, unspecified: Secondary | ICD-10-CM | POA: Diagnosis present

## 2011-08-10 DIAGNOSIS — Z8249 Family history of ischemic heart disease and other diseases of the circulatory system: Secondary | ICD-10-CM

## 2011-08-10 DIAGNOSIS — D62 Acute posthemorrhagic anemia: Secondary | ICD-10-CM

## 2011-08-10 DIAGNOSIS — R51 Headache: Secondary | ICD-10-CM | POA: Diagnosis present

## 2011-08-10 DIAGNOSIS — Z823 Family history of stroke: Secondary | ICD-10-CM

## 2011-08-10 DIAGNOSIS — F411 Generalized anxiety disorder: Secondary | ICD-10-CM | POA: Diagnosis present

## 2011-08-10 DIAGNOSIS — K922 Gastrointestinal hemorrhage, unspecified: Secondary | ICD-10-CM

## 2011-08-10 DIAGNOSIS — I252 Old myocardial infarction: Secondary | ICD-10-CM

## 2011-08-10 DIAGNOSIS — E119 Type 2 diabetes mellitus without complications: Secondary | ICD-10-CM | POA: Diagnosis present

## 2011-08-10 DIAGNOSIS — K222 Esophageal obstruction: Secondary | ICD-10-CM | POA: Diagnosis present

## 2011-08-10 DIAGNOSIS — K589 Irritable bowel syndrome without diarrhea: Secondary | ICD-10-CM | POA: Diagnosis present

## 2011-08-10 DIAGNOSIS — K5731 Diverticulosis of large intestine without perforation or abscess with bleeding: Principal | ICD-10-CM | POA: Diagnosis present

## 2011-08-10 DIAGNOSIS — K5730 Diverticulosis of large intestine without perforation or abscess without bleeding: Secondary | ICD-10-CM

## 2011-08-10 DIAGNOSIS — Z7982 Long term (current) use of aspirin: Secondary | ICD-10-CM

## 2011-08-10 DIAGNOSIS — F17200 Nicotine dependence, unspecified, uncomplicated: Secondary | ICD-10-CM | POA: Diagnosis present

## 2011-08-10 DIAGNOSIS — Z8673 Personal history of transient ischemic attack (TIA), and cerebral infarction without residual deficits: Secondary | ICD-10-CM

## 2011-08-10 DIAGNOSIS — K5900 Constipation, unspecified: Secondary | ICD-10-CM | POA: Diagnosis present

## 2011-08-10 LAB — COMPREHENSIVE METABOLIC PANEL
ALT: 12 U/L (ref 0–35)
AST: 15 U/L (ref 0–37)
Albumin: 3.9 g/dL (ref 3.5–5.2)
Alkaline Phosphatase: 71 U/L (ref 39–117)
BUN: 12 mg/dL (ref 6–23)
CO2: 23 mEq/L (ref 19–32)
Calcium: 9.6 mg/dL (ref 8.4–10.5)
Chloride: 100 mEq/L (ref 96–112)
Creatinine, Ser: 0.78 mg/dL (ref 0.50–1.10)
GFR calc Af Amer: >90 mL/min (ref >90)
GFR calc non Af Amer: >90 mL/min (ref >90)
Glucose, Bld: 278 mg/dL — ABNORMAL HIGH (ref 70–99)
Potassium: 3.1 mEq/L — ABNORMAL LOW (ref 3.5–5.1)
Sodium: 137 mEq/L (ref 135–145)
Total Bilirubin: 0.7 mg/dL (ref 0.3–1.2)
Total Protein: 7.3 g/dL (ref 6.0–8.3)

## 2011-08-10 LAB — CBC
HCT: 38.5 % (ref 36.0–46.0)
HCT: 28.4 % — ABNORMAL LOW (ref 36.0–46.0)
Hemoglobin: 13.1 g/dL (ref 12.0–15.0)
Hemoglobin: 9.5 g/dL — ABNORMAL LOW (ref 12.0–15.0)
MCH: 27.9 pg (ref 26.0–34.0)
MCH: 27.5 pg (ref 26.0–34.0)
MCHC: 34 g/dL (ref 30.0–36.0)
MCHC: 33.5 g/dL (ref 30.0–36.0)
MCV: 81.9 fL (ref 78.0–100.0)
MCV: 82.1 fL (ref 78.0–100.0)
Platelets: 261 10*3/uL (ref 150–400)
Platelets: 201 10*3/uL (ref 150–400)
RBC: 4.7 MIL/uL (ref 3.87–5.11)
RBC: 3.46 MIL/uL — ABNORMAL LOW (ref 3.87–5.11)
RDW: 12.9 % (ref 11.5–15.5)
RDW: 12.8 % (ref 11.5–15.5)
WBC: 10.9 10*3/uL — ABNORMAL HIGH (ref 4.0–10.5)
WBC: 13 10*3/uL — ABNORMAL HIGH (ref 4.0–10.5)

## 2011-08-10 LAB — DIFFERENTIAL
Basophils Absolute: 0 10*3/uL (ref 0.0–0.1)
Basophils Relative: 0 % (ref 0–1)
Eosinophils Absolute: 0.1 10*3/uL (ref 0.0–0.7)
Eosinophils Relative: 1 % (ref 0–5)
Lymphocytes Relative: 25 % (ref 12–46)
Lymphs Abs: 2.7 10*3/uL (ref 0.7–4.0)
Monocytes Absolute: 0.5 10*3/uL (ref 0.1–1.0)
Monocytes Relative: 5 % (ref 3–12)
Neutro Abs: 7.6 10*3/uL (ref 1.7–7.7)
Neutrophils Relative %: 69 % (ref 43–77)

## 2011-08-10 LAB — MRSA PCR SCREENING: MRSA by PCR: NEGATIVE

## 2011-08-10 LAB — PREPARE RBC (CROSSMATCH)

## 2011-08-10 LAB — LACTIC ACID, PLASMA: Lactic Acid, Venous: 2.1 mmol/L (ref 0.5–2.2)

## 2011-08-10 LAB — PROTIME-INR
INR: 1.05 (ref 0.00–1.49)
Prothrombin Time: 13.9 seconds (ref 11.6–15.2)

## 2011-08-10 LAB — APTT: aPTT: 35 seconds (ref 24–37)

## 2011-08-10 MED ORDER — ONDANSETRON HCL 4 MG/2ML IJ SOLN
INTRAMUSCULAR | Status: AC
  Filled 2011-08-10: qty 2

## 2011-08-10 MED ORDER — SODIUM CHLORIDE 0.9 % IV SOLN
INTRAVENOUS | Status: DC
  Administered 2011-08-10 – 2011-08-11 (×3): via INTRAVENOUS

## 2011-08-10 MED ORDER — MIDAZOLAM HCL 5 MG/5ML IJ SOLN
INTRAMUSCULAR | Status: AC | PRN
  Administered 2011-08-10: 1 mg via INTRAVENOUS
  Administered 2011-08-10: 2 mg via INTRAVENOUS
  Administered 2011-08-10: 0.5 mg via INTRAVENOUS

## 2011-08-10 MED ORDER — NITROGLYCERIN 1 MG/10 ML FOR IR/CATH LAB
INTRA_ARTERIAL | Status: AC
  Administered 2011-08-10: 100 ug
  Filled 2011-08-10: qty 10

## 2011-08-10 MED ORDER — FENTANYL CITRATE 0.05 MG/ML IJ SOLN
INTRAMUSCULAR | Status: AC | PRN
  Administered 2011-08-10 (×3): 50 ug via INTRAVENOUS

## 2011-08-10 MED ORDER — SODIUM CHLORIDE 0.9 % IJ SOLN
3.0000 mL | Freq: Two times a day (BID) | INTRAMUSCULAR | Status: DC
  Administered 2011-08-10 – 2011-08-15 (×8): 3 mL via INTRAVENOUS

## 2011-08-10 MED ORDER — POTASSIUM CHLORIDE CRYS ER 20 MEQ PO TBCR
40.0000 meq | EXTENDED_RELEASE_TABLET | Freq: Once | ORAL | Status: AC
  Administered 2011-08-10: 40 meq via ORAL
  Filled 2011-08-10: qty 2

## 2011-08-10 MED ORDER — METOCLOPRAMIDE HCL 5 MG/ML IJ SOLN: 10.0000 mg | Freq: Once | INTRAMUSCULAR | Status: DC

## 2011-08-10 MED ORDER — ONDANSETRON HCL 4 MG/2ML IJ SOLN
4.0000 mg | Freq: Once | INTRAMUSCULAR | Status: AC
  Administered 2011-08-10: 4 mg via INTRAVENOUS

## 2011-08-10 MED ORDER — PANTOPRAZOLE SODIUM 40 MG IV SOLR
40.0000 mg | INTRAVENOUS | Status: DC
  Administered 2011-08-10 – 2011-08-12 (×3): 40 mg via INTRAVENOUS
  Filled 2011-08-10 (×4): qty 40

## 2011-08-10 MED ORDER — POTASSIUM CHLORIDE 10 MEQ/100ML IV SOLN
10.0000 meq | Freq: Once | INTRAVENOUS | Status: AC
  Administered 2011-08-10: 10 meq via INTRAVENOUS
  Filled 2011-08-10: qty 100

## 2011-08-10 MED ORDER — SODIUM CHLORIDE 0.9 % IV BOLUS (SEPSIS)
2000.0000 mL | Freq: Once | INTRAVENOUS | Status: AC
  Administered 2011-08-10: 2000 mL via INTRAVENOUS

## 2011-08-10 MED ORDER — HYOSCYAMINE SULFATE 0.125 MG SL SUBL
0.2500 mg | SUBLINGUAL_TABLET | SUBLINGUAL | Status: DC | PRN
  Administered 2011-08-10: 0.25 mg via SUBLINGUAL
  Filled 2011-08-10 (×2): qty 2

## 2011-08-10 MED ORDER — PANTOPRAZOLE SODIUM 40 MG IV SOLR
40.0000 mg | Freq: Once | INTRAVENOUS | Status: AC
  Administered 2011-08-10: 40 mg via INTRAVENOUS
  Filled 2011-08-10: qty 40

## 2011-08-10 NOTE — Consult Note (Signed)
[facility] Consultation  Referring Provider: No ref. provider found Primary Care Physician:  Ricki Rodriguez, MD, MD Primary Gastroenterologist:  Dr. Leone Payor  Reason for Consultation:  hematochezia  HPI: [name] is a 56 y.o. female with hx of HTN, DM, CVA, CAD without hx of PCI on plavix, IBS, GERD, hidradenitis, and diverticulosis (hepatic flexure to sigmoid) who presents with several hours of painless hematochezia.  The patient reports she has been in her normal state of health, until this morning when she developed the urge to pass a bowel movement. This consists of mostly bright red blood with maroon clots. This started about 9 AM today. The bleeding was painless other than lower abdominal cramping which began in the emergency department. His cramping is relieved by defecation. She's had no other abdominal pain, no nausea or vomiting. She's had no generalized weakness, dyspnea or presyncopal symptoms. She denies fatigue on presentation. No fevers or chills. Bowel habits have been usual for her, which consists of alternating diarrhea and at times constipation. She has remained on Plavix for her history of heart disease and stroke.  In the ED, she developed additional episodes of hematochezia. Initially on presentation her hemoglobin was 13.1 and her vital signs stable. Several hours after I first saw her in the ER, was contacted again by the ER physician stating that she had developed near constant hematochezia. She developed hypotension with systolics in the low 80s. She required fluid bolus and packed red blood cell transfusion was initiated. Repeat hemoglobin was 9.5.  Henri examination, the patient now reports feeling weak and lightheaded. She also feels lower abdominal cramping associated with bowel movement/hematochezia.  Past Medical History  Diagnosis Date  . Diabetes mellitus   . Hypertension   . Stroke   . Anxiety   . Heart attack   . Hyperlipidemia   . GERD  (gastroesophageal reflux disease)   . Irritable bowel syndrome   . Chronic headaches   . Sinus problem   . Hidradenitis     groin    Past Surgical History  Procedure Date  . Hiatal hernia repair   . Breast surgery 2007, 2008  . Breast reduction surgery   . Axillary hidradenitis excision 1990-2008    bilateral  . Excision hidradenitis groin and abdomen 2012  . Back surgery   . Cervical disc surgery     Prior to Admission medications   Medication Sig Start Date End Date Taking? Authorizing Provider  ALPRAZolam Prudy Feeler) 0.5 MG tablet Take 0.25 mg by mouth at bedtime as needed. For anxiety   Yes Historical Provider, MD  aspirin EC 81 MG tablet Take 81 mg by mouth daily.   Yes Historical Provider, MD  clopidogrel (PLAVIX) 75 MG tablet Take 75 mg by mouth daily.     Yes Historical Provider, MD  esomeprazole (NEXIUM) 40 MG capsule Take 40 mg by mouth daily before breakfast.     Yes Historical Provider, MD  glimepiride (AMARYL) 4 MG tablet Take 4 mg by mouth 2 (two) times daily.    Yes Historical Provider, MD  ibuprofen (ADVIL,MOTRIN) 800 MG tablet Take 800 mg by mouth every 8 (eight) hours as needed. FOR PAIN   Yes Historical Provider, MD  mometasone (NASONEX) 50 MCG/ACT nasal spray Place 1 spray into the nose daily as needed. For allergies   Yes Historical Provider, MD  mupirocin cream (BACTROBAN) 2 % Apply 1 application topically daily.   Yes Historical Provider, MD  nitroGLYCERIN (NITRODUR - DOSED IN MG/24 HR)  0.4 mg/hr Place 1 patch onto the skin daily.   Yes Historical Provider, MD  nitroGLYCERIN (NITROSTAT) 0.4 MG SL tablet Place 0.4 mg under the tongue every 5 (five) minutes as needed.   Yes Historical Provider, MD  Olmesartan-Amlodipine-HCTZ (TRIBENZOR) 40-5-12.5 MG TABS Take 1 tablet by mouth daily.   Yes Historical Provider, MD  Potassium Chloride Crys CR (KLOR-CON M20 PO) Take 1 capsule by mouth daily.     Yes Historical Provider, MD  rosuvastatin (CRESTOR) 20 MG tablet Take 20  mg by mouth daily.   Yes Historical Provider, MD  saxagliptin HCl (ONGLYZA) 5 MG TABS tablet Take 5 mg by mouth daily.   Yes Historical Provider, MD  SUMAtriptan (IMITREX) 50 MG tablet Take 50 mg by mouth daily as needed. For migraine Up to 9 monthly   Yes Historical Provider, MD  traMADol (ULTRAM) 50 MG tablet Take 50 mg by mouth every 6 (six) hours as needed. FOR PAIN   Yes Historical Provider, MD  zolpidem (AMBIEN) 10 MG tablet Take 10 mg by mouth at bedtime as needed. FOR SLEEP   Yes Historical Provider, MD    Current Facility-Administered Medications  Medication Dose Route Frequency Provider Last Rate Last Dose  . hyoscyamine (LEVSIN SL) SL tablet 0.25 mg  0.25 mg Sublingual Q4H PRN Beverley Fiedler, MD      . metoCLOPramide (REGLAN) injection 10 mg  10 mg Intravenous Once [employer], PA      . ondansetron [facility]) 4 MG/2ML injection           . ondansetron (ZOFRAN) injection 4 mg  4 mg Intravenous Once Otilio Miu, PA   4 mg at 08/10/11 1456  . pantoprazole (PROTONIX) injection 40 mg  40 mg Intravenous Once Otilio Miu, PA   40 mg at 08/10/11 1315  . potassium chloride 10 mEq in 100 mL IVPB  10 mEq Intravenous Once Otilio Miu, PA   10 mEq at 08/10/11 1354  . potassium chloride SA (K-DUR,KLOR-CON) CR tablet 40 mEq  40 mEq Oral Once Otilio Miu, PA   40 mEq at 08/10/11 1401  . sodium chloride 0.9 % bolus 2,000 mL  2,000 mL Intravenous Once Otilio Miu, PA   2,000 mL at 08/10/11 1309   Current Outpatient Prescriptions  Medication Sig Dispense Refill  . ALPRAZolam (XANAX) 0.5 MG tablet Take 0.25 mg by mouth at bedtime as needed. For anxiety      . aspirin EC 81 MG tablet Take 81 mg by mouth daily.      . clopidogrel (PLAVIX) 75 MG tablet Take 75 mg by mouth daily.        Marland Kitchen esomeprazole (NEXIUM) 40 MG capsule Take 40 mg by mouth daily before breakfast.        . glimepiride (AMARYL) 4 MG tablet Take 4 mg by mouth 2 (two) times  daily.       Marland Kitchen ibuprofen (ADVIL,MOTRIN) 800 MG tablet Take 800 mg by mouth every 8 (eight) hours as needed. FOR PAIN      . mometasone (NASONEX) 50 MCG/ACT nasal spray Place 1 spray into the nose daily as needed. For allergies      . mupirocin cream (BACTROBAN) 2 % Apply 1 application topically daily.      . nitroGLYCERIN (NITRODUR - DOSED IN MG/24 HR) 0.4 mg/hr Place 1 patch onto the skin daily.      . nitroGLYCERIN (NITROSTAT) 0.4 MG SL tablet Place 0.4 mg under the  tongue every 5 (five) minutes as needed.      . Olmesartan-Amlodipine-HCTZ (TRIBENZOR) 40-5-12.5 MG TABS Take 1 tablet by mouth daily.      . Potassium Chloride Crys CR (KLOR-CON M20 PO) Take 1 capsule by mouth daily.        . rosuvastatin (CRESTOR) 20 MG tablet Take 20 mg by mouth daily.      . saxagliptin HCl (ONGLYZA) 5 MG TABS tablet Take 5 mg by mouth daily.      . SUMAtriptan (IMITREX) 50 MG tablet Take 50 mg by mouth daily as needed. For migraine Up to 9 monthly      . traMADol (ULTRAM) 50 MG tablet Take 50 mg by mouth every 6 (six) hours as needed. FOR PAIN      . zolpidem (AMBIEN) 10 MG tablet Take 10 mg by mouth at bedtime as needed. FOR SLEEP        Allergies as of 08/10/2011 - Review Complete 08/10/2011  Allergen Reaction Noted  . Iodine Swelling 12/14/2010  . Metformin and related Diarrhea 07/03/2011  . Adhesive (tape) Rash 07/05/2011    Family History  Problem Relation Age of Onset  . Cancer Mother     breast  . Heart disease Mother   . Hypertension Mother   . Diabetes Father   . Heart disease Father   . Stroke Father   . Heart disease Sister   . Hypertension Sister   . Hypertension Sister   . Hyperlipidemia Sister   . Diabetes Sister     History   Social History  . Marital Status: Divorced    Spouse Name: N/A    Number of Children: N/A  . Years of Education: N/A   Occupational History  . Not on file.   Social History Main Topics  . Smoking status: Current Everyday Smoker -- 0.3 packs/day   . Smokeless tobacco: Not on file   Comment: trying to wuit... less than .25 pack  . Alcohol Use: No  . Drug Use: No  . Sexually Active: Not on file   Other Topics Concern  . Not on file   Social History Narrative  . No narrative on file    Review of Systems: As per HPI, otherwise neg.  Physical Exam: Vital signs in last 24 hours: Temp:  [97.9 F (36.6 C)-98.6 F (37 C)] 97.9 F (36.6 C) (04/06 1545) Pulse Rate:  [51-145] 80  (04/06 1545) Resp:  [14-18] 14  (04/06 1615) BP: (66-147)/(37-100) 97/51 mmHg (04/06 1615) SpO2:  [92 %-100 %] 100 % (04/06 1504) Weight:  [141 lb (63.957 kg)] 141 lb (63.957 kg) (04/06 1155)   Gen: awake, alert, NAD, more pale-appearing on second examination HEENT: anicteric, op clear CV: RRR, no mrg Pulm: CTA b/l Abd: soft, mild tenderness across the lower abdomen to deep palpation without rebound or guarding, nondistended, positive bowel sounds throughout  Ext: no c/c/e Neuro: nonfocal    Intake/Output this shift: Total I/O In: 2850 [I.V.:2500; Blood:350] Out: -   Lab Results:  Basename 08/10/11 1441 08/10/11 1225  WBC 13.0* 10.9*  HGB 9.5* 13.1  HCT 28.4* 38.5  PLT 201 261   BMET  Basename 08/10/11 1225  NA 137  K 3.1*  CL 100  CO2 23  GLUCOSE 278*  BUN 12  CREATININE 0.78  CALCIUM 9.6   LFT  Basename 08/10/11 1225  PROT 7.3  ALBUMIN 3.9  AST 15  ALT 12  ALKPHOS 71  BILITOT 0.7  BILIDIR --  IBILI --   PT/INR  Basename 08/10/11 1225  LABPROT 13.9  INR 1.05    Previous Endoscopies: Colonoscopy, Dr. Leone Payor, 06/19/2006 -- excellent prep examination to the terminal ileum revealed diverticulosis from hepatic flexure to the sigmoid. Otherwise normal examination  Impression / Plan:  56 y.o. female with hx of HTN, DM, CVA, CAD without hx of PCI on plavix, IBS, GERD, hidradenitis, and diverticulosis (hepatic flexure to sigmoid) who presents with several hours of painless hematochezia with subsequent development of  hypotension and acute post-hemorrhage anemia felt to be secondary to diverticular hemorrhage.  1. Diverticular hemorrhage -- the patient continues to experience painless (other than lower, crampy associated with bowel movement) hematochezia with an approximate 4 g drop in her hemoglobin over the last several hours. The most likely cause of her bleeding is diverticular hemorrhage, exacerbated by clopidogrel use. She is getting fluid resuscitation and packed red cells now. She continues to have borderline hypotension but she is not tachycardic.  Colonoscopy, is not a great option for her given its low yield and diverticular hemorrhage and the fact that she has not had a colonoscopy prep. Initially, I recommend a tagged red blood cell scan, but given her obvious active bleeding, direct to mesenteric angiography is recommended. I discussed this with Dr. Fredia Sorrow, and he will perform angiography soon. In the interim I will prescribe Levsin for lower abdominal cramping. Continue to follow H&H and transfuse as necessary. Hold Plavix. --Mesenteric angiography --Ongoing resuscitation --Closely monitor hemoglobin and hematocrit, transfuse as necessary --Hold Plavix --Consider platelet transfusion to attempt to reverse the effects of clopidogrel    LOS: 0 days   Misao Fackrell M  08/10/2011, 4:30 PM

## 2011-08-10 NOTE — ED Notes (Signed)
bp elevated.  Pt pale but alert and talking.  Report called to [facility] on 2900 and IR aware and to take patient there after procedure.  Report given to IR nurse

## 2011-08-10 NOTE — Procedures (Signed)
Procedure:  Selective SMA and IMA arteriography Access:  Right CFA Findings:  Extensive arteriography prior to and following vasodilatation with intraarterial NTG demonstrates no active GI bleed, AVM, angiodysplasia or abnormal enhancement. Groin closure w/ Cordis ExoSeal device.

## 2011-08-10 NOTE — ED Provider Notes (Signed)
History     CSN: [id number]  Arrival date & time 08/10/11  1149   First MD Initiated Contact with Patient 08/10/11 1224      Chief Complaint  Patient presents with  . Rectal Bleeding    (Consider location/radiation/quality/duration/timing/severity/associated sxs/prior treatment) HPI History provided by pt.   Pt c/o continuous, bright red rectal bleeding since approx 9am today.  Associated w/ lower abdominal cramping that started here in ED.  Denies N/V and rectal pain.  No generalized weakness, SOB or lightheadedness this morning.  Has hx of diverticulosis with bleeding in the past.  Has had multiple colonoscopies in the past d/t h/o IBS.  Is anti-coagulated w/ plavix.  Remote hx of blood transfusion  Past Medical History  Diagnosis Date  . Diabetes mellitus   . Hypertension   . Stroke   . Anxiety   . Heart attack   . Hyperlipidemia   . GERD (gastroesophageal reflux disease)   . Irritable bowel syndrome   . Chronic headaches   . Sinus problem   . Hidradenitis     groin    Past Surgical History  Procedure Date  . Hiatal hernia repair   . Breast surgery 2007, 2008  . Breast reduction surgery   . Axillary hidradenitis excision 1990-2008    bilateral  . Excision hidradenitis groin and abdomen 2012  . Back surgery   . Cervical disc surgery     Family History  Problem Relation Age of Onset  . Cancer Mother     breast  . Heart disease Mother   . Hypertension Mother   . Diabetes Father   . Heart disease Father   . Stroke Father   . Heart disease Sister   . Hypertension Sister   . Hypertension Sister   . Hyperlipidemia Sister   . Diabetes Sister     History  Substance Use Topics  . Smoking status: Current Everyday Smoker -- 0.3 packs/day  . Smokeless tobacco: Not on file   Comment: trying to wuit... less than .25 pack  . Alcohol Use: No    OB History    Grav Para Term Preterm Abortions TAB SAB Ect Mult Living                  Review of Systems  All  other systems reviewed and are negative.    Allergies  Iodine; Metformin and related; and Adhesive  Home Medications   Current Outpatient Rx  Name Route Sig Dispense Refill  . ALPRAZOLAM 0.5 MG PO TABS Oral Take 0.25 mg by mouth at bedtime as needed. For anxiety    . ASPIRIN EC 81 MG PO TBEC Oral Take 81 mg by mouth daily.    Marland Kitchen CLOPIDOGREL BISULFATE 75 MG PO TABS Oral Take 75 mg by mouth daily.      Marland Kitchen ESOMEPRAZOLE MAGNESIUM 40 MG PO CPDR Oral Take 40 mg by mouth daily before breakfast.      . GLIMEPIRIDE 4 MG PO TABS Oral Take 4 mg by mouth 2 (two) times daily.     . IBUPROFEN 800 MG PO TABS Oral Take 800 mg by mouth every 8 (eight) hours as needed. FOR PAIN    . MOMETASONE FUROATE 50 MCG/ACT NA SUSP Nasal Place 1 spray into the nose daily as needed. For allergies    . MUPIROCIN CALCIUM 2 % EX CREA Topical Apply 1 application topically daily.    Marland Kitchen NITROGLYCERIN 0.4 MG/HR TD PT24 Transdermal Place 1 patch onto the  skin daily.    Marland Kitchen NITROGLYCERIN 0.4 MG SL SUBL Sublingual Place 0.4 mg under the tongue every 5 (five) minutes as needed.    Marland Kitchen OLMESARTAN-AMLODIPINE-HCTZ 40-5-12.5 MG PO TABS Oral Take 1 tablet by mouth daily.    Marland Kitchen KLOR-CON M20 PO Oral Take 1 capsule by mouth daily.      Marland Kitchen ROSUVASTATIN CALCIUM 20 MG PO TABS Oral Take 20 mg by mouth daily.    Marland Kitchen SAXAGLIPTIN HCL 5 MG PO TABS Oral Take 5 mg by mouth daily.    . SUMATRIPTAN SUCCINATE 50 MG PO TABS Oral Take 50 mg by mouth daily as needed. For migraine Up to 9 monthly    . TRAMADOL HCL 50 MG PO TABS Oral Take 50 mg by mouth every 6 (six) hours as needed. FOR PAIN    . ZOLPIDEM TARTRATE 10 MG PO TABS Oral Take 10 mg by mouth at bedtime as needed. FOR SLEEP      BP 130/96  Pulse 145  Temp(Src) 98.4 F (36.9 C) (Oral)  Ht 5\' 4"  (1.626 m)  Wt 141 lb (63.957 kg)  BMI 24.20 kg/m2  SpO2 97%  Physical Exam  Nursing note and vitals reviewed. Constitutional: She is oriented to person, place, and time. She appears well-developed and  well-nourished. No distress.  HENT:  Head: Normocephalic and atraumatic.  Eyes:       Normal appearance; pink conjunctiva  Neck: Normal range of motion.  Cardiovascular: Regular rhythm.        Tachycardic at 120  Pulmonary/Chest: Effort normal and breath sounds normal.  Abdominal: Soft. Bowel sounds are normal. She exhibits no distension and no mass. There is no tenderness. There is no rebound and no guarding.       Bright red blood w/ clots from rectum  Neurological: She is alert and oriented to person, place, and time.  Skin: Skin is warm and dry. No rash noted.  Psychiatric: She has a normal mood and affect. Her behavior is normal.    ED Course  Procedures (including critical care time)  CRITICAL CARE Performed by: Dayton Bailiff   Total critical care time: 45 min  Critical care time was exclusive of separately billable procedures and treating other patients.  Critical care was necessary to treat or prevent imminent or life-threatening deterioration.  Critical care was time spent personally by me on the following activities: development of treatment plan with patient and/or surrogate as well as nursing, discussions with consultants, evaluation of patient's response to treatment, examination of patient, obtaining history from patient or surrogate, ordering and performing treatments and interventions, ordering and review of laboratory studies, ordering and review of radiographic studies, pulse oximetry and re-evaluation of patient's condition.  Labs Reviewed  CBC - Abnormal; Notable for the following:    WBC 10.9 (*)    All other components within normal limits  COMPREHENSIVE METABOLIC PANEL - Abnormal; Notable for the following:    Potassium 3.1 (*)    Glucose, Bld 278 (*)    All other components within normal limits  DIFFERENTIAL  APTT  PROTIME-INR  TYPE AND SCREEN   No results found.   1. GI bleed       MDM  Pt presents w/ lower GI bleed.  No prior history.   A&O, tachycardic, normotensive, pink conjunctiva, active rectal bleeding on exam.  Hgb 13.1 currently.  Pt has two lines and is receiving a 2L bolus of fluids.  Dr. Rhea Belton, GI, has has seen patient in consult and  requested a tag red blood cell scan which is pending.   Dr. Algie Coffer consulted for admission.    HR improved and VS otherwise stable.  1:43 PM   Pt continues to bleed from her rectum.  She is now experiencing dizziness and nausea.  Repeat BP 88/41.  2units O- blood ordered.  Pt still receiving IV fluids.  Dr. Sharyn Lull and GI have been updated.  2:51 PM   Given the amount of rectal bleeding the patient had a repeat hemoglobin was obtained which was 9.5. This is nearly a 4 g drop since her initial level. She did receive IV fluids. She will receive 2 units of emergency release blood as she is symptomatic with hypotension and tachycardia. She was also type and crossmatch for 2 units as well as 4 units on hold. Her level of care was upgraded to the CCU per Dr. her wanting. Additional IV fluids. She has to 20-gauge IVs.     Otilio Miu, PA 08/10/11 1451  Dayton Bailiff, MD 08/10/11 1525

## 2011-08-10 NOTE — ED Notes (Signed)
Patient with reported dizziness and not feeling well.  Patient has passed large amount of bright red blood from her rectum x 3.  Patient is pale in color.  ermd and erpa aware of patient compalints.  ermd to bedside.  Bolus of fluids started

## 2011-08-10 NOTE — ED Notes (Signed)
No s/sx of reaction, rate increased per protocol.  Family is now at bedside.

## 2011-08-10 NOTE — H&P (Signed)
[name] is an 56 y.o. female.   Chief Complaint: Bright red blood per rectum HPI: Patient is 56 year old female with past medical history significant for multiple medical problems i.e. hypertension non-insulin-dependent diabetes mellitus hypercholesteremia history of CVA x2 history of questionable silent MI in the past had this hernia tobacco abuse remote history of cocaine abuse GERD history of migraine headache came to the ER complaining of recurrent episodes of bright red blood per rectum since this morning. Patient denies any chest pain nausea vomiting diaphoresis. Denies any lightheadedness dizziness or syncopal episode. Denies any abdominal pain. States had been taking Plavix for long time and occasionally takes 800 mg of ibuprofen. Denies such episodes of bleeding in the past. In the ER patient was noted to be tachycardic and had 2 more episodes of bright red blood per rectum with clots. Patient already seen by GI in ER and consult and has requested bleeding scan.  Past Medical History  Diagnosis Date  . Diabetes mellitus   . Hypertension   . Stroke   . Anxiety   . Heart attack   . Hyperlipidemia   . GERD (gastroesophageal reflux disease)   . Irritable bowel syndrome   . Chronic headaches   . Sinus problem   . Hidradenitis     groin    Past Surgical History  Procedure Date  . Hiatal hernia repair   . Breast surgery 2007, 2008  . Breast reduction surgery   . Axillary hidradenitis excision 1990-2008    bilateral  . Excision hidradenitis groin and abdomen 2012  . Back surgery   . Cervical disc surgery     Family History  Problem Relation Age of Onset  . Cancer Mother     breast  . Heart disease Mother   . Hypertension Mother   . Diabetes Father   . Heart disease Father   . Stroke Father   . Heart disease Sister   . Hypertension Sister   . Hypertension Sister   . Hyperlipidemia Sister   . Diabetes Sister    Social History:  reports that she has been smoking.   She does not have any smokeless tobacco history on file. She reports that she does not drink alcohol or use illicit drugs.  Allergies:  Allergies  Allergen Reactions  . Iodine Swelling  . Metformin And Related Diarrhea  . Adhesive (Tape) Rash    Medications Prior to Admission  Medication Dose Route Frequency Provider Last Rate Last Dose  . pantoprazole (PROTONIX) injection 40 mg  40 mg Intravenous Once Otilio Miu, PA   40 mg at 08/10/11 1315  . potassium chloride 10 mEq in 100 mL IVPB  10 mEq Intravenous Once Otilio Miu, PA   10 mEq at 08/10/11 1354  . potassium chloride SA (K-DUR,KLOR-CON) CR tablet 40 mEq  40 mEq Oral Once Otilio Miu, PA   40 mEq at 08/10/11 1401  . sodium chloride 0.9 % bolus 2,000 mL  2,000 mL Intravenous Once Otilio Miu, PA   2,000 mL at 08/10/11 1309   Medications Prior to Admission  Medication Sig Dispense Refill  . aspirin EC 81 MG tablet Take 81 mg by mouth daily.      . clopidogrel (PLAVIX) 75 MG tablet Take 75 mg by mouth daily.        Marland Kitchen esomeprazole (NEXIUM) 40 MG capsule Take 40 mg by mouth daily before breakfast.        . glimepiride (AMARYL) 4 MG  tablet Take 4 mg by mouth 2 (two) times daily.       Marland Kitchen ibuprofen (ADVIL,MOTRIN) 800 MG tablet Take 800 mg by mouth every 8 (eight) hours as needed. FOR PAIN      . mometasone (NASONEX) 50 MCG/ACT nasal spray Place 1 spray into the nose daily as needed. For allergies      . nitroGLYCERIN (NITRODUR - DOSED IN MG/24 HR) 0.4 mg/hr Place 1 patch onto the skin daily.      . nitroGLYCERIN (NITROSTAT) 0.4 MG SL tablet Place 0.4 mg under the tongue every 5 (five) minutes as needed.      . Olmesartan-Amlodipine-HCTZ (TRIBENZOR) 40-5-12.5 MG TABS Take 1 tablet by mouth daily.      . Potassium Chloride Crys CR (KLOR-CON M20 PO) Take 1 capsule by mouth daily.        . rosuvastatin (CRESTOR) 20 MG tablet Take 20 mg by mouth daily.      . saxagliptin HCl (ONGLYZA) 5 MG TABS tablet  Take 5 mg by mouth daily.      . SUMAtriptan (IMITREX) 50 MG tablet Take 50 mg by mouth daily as needed. For migraine Up to 9 monthly      . traMADol (ULTRAM) 50 MG tablet Take 50 mg by mouth every 6 (six) hours as needed. FOR PAIN      . zolpidem (AMBIEN) 10 MG tablet Take 10 mg by mouth at bedtime as needed. FOR SLEEP        Results for orders placed during the hospital encounter of 08/10/11 (from the past 48 hour(s))  TYPE AND SCREEN     Status: Normal   Collection Time   08/10/11 12:15 PM      Component Value Range Comment   ABO/RH(D) B POS      Antibody Screen POS      Sample Expiration 08/13/2011     CBC     Status: Abnormal   Collection Time   08/10/11 12:25 PM      Component Value Range Comment   WBC 10.9 (*) 4.0 - 10.5 (K/uL)    RBC 4.70  3.87 - 5.11 (MIL/uL)    Hemoglobin 13.1  12.0 - 15.0 (g/dL)    HCT 16.1  09.6 - 04.5 (%)    MCV 81.9  78.0 - 100.0 (fL)    MCH 27.9  26.0 - 34.0 (pg)    MCHC 34.0  30.0 - 36.0 (g/dL)    RDW 40.9  81.1 - 91.4 (%)    Platelets 261  150 - 400 (K/uL)   DIFFERENTIAL     Status: Normal   Collection Time   08/10/11 12:25 PM      Component Value Range Comment   Neutrophils Relative 69  43 - 77 (%)    Neutro Abs 7.6  1.7 - 7.7 (K/uL)    Lymphocytes Relative 25  12 - 46 (%)    Lymphs Abs 2.7  0.7 - 4.0 (K/uL)    Monocytes Relative 5  3 - 12 (%)    Monocytes Absolute 0.5  0.1 - 1.0 (K/uL)    Eosinophils Relative 1  0 - 5 (%)    Eosinophils Absolute 0.1  0.0 - 0.7 (K/uL)    Basophils Relative 0  0 - 1 (%)    Basophils Absolute 0.0  0.0 - 0.1 (K/uL)   COMPREHENSIVE METABOLIC PANEL     Status: Abnormal   Collection Time   08/10/11 12:25 PM  Component Value Range Comment   Sodium 137  135 - 145 (mEq/L)    Potassium 3.1 (*) 3.5 - 5.1 (mEq/L)    Chloride 100  96 - 112 (mEq/L)    CO2 23  19 - 32 (mEq/L)    Glucose, Bld 278 (*) 70 - 99 (mg/dL)    BUN 12  6 - 23 (mg/dL)    Creatinine, Ser 0.98  0.50 - 1.10 (mg/dL)    Calcium 9.6  8.4 - 10.5  (mg/dL)    Total Protein 7.3  6.0 - 8.3 (g/dL)    Albumin 3.9  3.5 - 5.2 (g/dL)    AST 15  0 - 37 (U/L)    ALT 12  0 - 35 (U/L)    Alkaline Phosphatase 71  39 - 117 (U/L)    Total Bilirubin 0.7  0.3 - 1.2 (mg/dL)    GFR calc non Af Amer >90  >90 (mL/min)    GFR calc Af Amer >90  >90 (mL/min)   APTT     Status: Normal   Collection Time   08/10/11 12:25 PM      Component Value Range Comment   aPTT 35  24 - 37 (seconds)   PROTIME-INR     Status: Normal   Collection Time   08/10/11 12:25 PM      Component Value Range Comment   Prothrombin Time 13.9  11.6 - 15.2 (seconds)    INR 1.05  0.00 - 1.49     No results found.  Review of Systems  Constitutional: Negative for fever, chills and weight loss.  HENT: Negative for neck pain.   Eyes: Negative for blurred vision, double vision and photophobia.  Respiratory: Negative for cough, hemoptysis, sputum production and shortness of breath.   Cardiovascular: Negative for chest pain, palpitations, orthopnea, claudication and leg swelling.  Gastrointestinal: Positive for blood in stool. Negative for vomiting, abdominal pain and diarrhea.  Genitourinary: Negative for dysuria and urgency.  Musculoskeletal: Negative for back pain.  Skin: Negative for rash.  Neurological: Positive for headaches. Negative for dizziness.    Blood pressure 147/100, pulse 51, temperature 98.4 F (36.9 C), temperature source Oral, resp. rate 18, height 5\' 4"  (1.626 m), weight 63.957 kg (141 lb), SpO2 99.00%. Physical Exam  Constitutional: She is oriented to person, place, and time. She appears well-developed.  HENT:  Head: Normocephalic and atraumatic.  Eyes: Conjunctivae and EOM are normal. Pupils are equal, round, and reactive to light. Left eye exhibits no discharge. No scleral icterus.  Neck: Neck supple. No JVD present. No tracheal deviation present. No thyromegaly present.  Cardiovascular: Normal rate, regular rhythm and normal heart sounds.  Exam reveals no  gallop.   Respiratory: Breath sounds normal. No respiratory distress. She has no wheezes. She has no rales. She exhibits no tenderness.  GI: Bowel sounds are normal. She exhibits no distension. There is no tenderness. There is no rebound and no guarding.  Musculoskeletal: She exhibits no edema and no tenderness.  Lymphadenopathy:    She has no cervical adenopathy.  Neurological: She is alert and oriented to person, place, and time.     Assessment/Plan Acute lower GI bleeding rule out polyps rule out mass rule out internal hemorrhoids rule out AVM Hypertension Non-insulin-dependent diabetes mellitus History of irritable bowel syndrome History of CVA History of questionable silent MI in the past Tobacco abuse At this hernia History of remote cocaine abuse Hypercholesteremia Plan As per orders Type and cross match and hold 4 units  of packed RBCs GI consult Check serial H&H  Meygan Kyser N 08/10/2011, 2:06 PM

## 2011-08-10 NOTE — ED Notes (Signed)
Patient remains alert and oriented.  She continues to have abd cramping and feels like she is going to have bm

## 2011-08-10 NOTE — ED Notes (Signed)
Onset bleeding and clots from rectum on waking this am. Pt c/o abd cramps. Reports she felt fine until today. Pt is soaking throught pants in triage

## 2011-08-10 NOTE — ED Notes (Signed)
Patient remains alert and oriented.  No further blood loss at this time.  Iv potassium continues along with bolus of normal saline

## 2011-08-10 NOTE — ED Notes (Signed)
Patient aware of plans to transfuse due to her sx and blood loss.   Consent for blood transfusion obtained

## 2011-08-11 ENCOUNTER — Inpatient Hospital Stay (HOSPITAL_COMMUNITY): Payer: Medicare Other

## 2011-08-11 LAB — COMPREHENSIVE METABOLIC PANEL
ALT: 9 U/L (ref 0–35)
AST: 13 U/L (ref 0–37)
Albumin: 2.5 g/dL — ABNORMAL LOW (ref 3.5–5.2)
Alkaline Phosphatase: 46 U/L (ref 39–117)
BUN: 8 mg/dL (ref 6–23)
CO2: 20 mEq/L (ref 19–32)
Calcium: 7.1 mg/dL — ABNORMAL LOW (ref 8.4–10.5)
Chloride: 113 mEq/L — ABNORMAL HIGH (ref 96–112)
Creatinine, Ser: 0.6 mg/dL (ref 0.50–1.10)
GFR calc Af Amer: >90 mL/min (ref >90)
GFR calc non Af Amer: >90 mL/min (ref >90)
Glucose, Bld: 119 mg/dL — ABNORMAL HIGH (ref 70–99)
Potassium: 3.7 mEq/L (ref 3.5–5.1)
Sodium: 141 mEq/L (ref 135–145)
Total Bilirubin: 0.5 mg/dL (ref 0.3–1.2)
Total Protein: 4.6 g/dL — ABNORMAL LOW (ref 6.0–8.3)

## 2011-08-11 LAB — CBC
HCT: 28.2 % — ABNORMAL LOW (ref 36.0–46.0)
HCT: 32.8 % — ABNORMAL LOW (ref 36.0–46.0)
Hemoglobin: 9.6 g/dL — ABNORMAL LOW (ref 12.0–15.0)
Hemoglobin: 11.1 g/dL — ABNORMAL LOW (ref 12.0–15.0)
MCH: 27.9 pg (ref 26.0–34.0)
MCH: 27.7 pg (ref 26.0–34.0)
MCHC: 34 g/dL (ref 30.0–36.0)
MCHC: 33.8 g/dL (ref 30.0–36.0)
MCV: 82 fL (ref 78.0–100.0)
MCV: 81.8 fL (ref 78.0–100.0)
Platelets: 131 10*3/uL — ABNORMAL LOW (ref 150–400)
Platelets: 144 10*3/uL — ABNORMAL LOW (ref 150–400)
RBC: 3.44 MIL/uL — ABNORMAL LOW (ref 3.87–5.11)
RBC: 4.01 MIL/uL (ref 3.87–5.11)
RDW: 13.7 % (ref 11.5–15.5)
RDW: 13.7 % (ref 11.5–15.5)
WBC: 8.8 10*3/uL (ref 4.0–10.5)
WBC: 11.4 10*3/uL — ABNORMAL HIGH (ref 4.0–10.5)

## 2011-08-11 LAB — HEMOGLOBIN
Hemoglobin: 10.3 g/dL — ABNORMAL LOW (ref 12.0–15.0)
Hemoglobin: 12.1 g/dL (ref 12.0–15.0)

## 2011-08-11 LAB — PROTIME-INR
INR: 1.2 (ref 0.00–1.49)
Prothrombin Time: 15.5 seconds — ABNORMAL HIGH (ref 11.6–15.2)

## 2011-08-11 LAB — GLUCOSE, CAPILLARY
Glucose-Capillary: 156 mg/dL — ABNORMAL HIGH (ref 70–99)
Glucose-Capillary: 127 mg/dL — ABNORMAL HIGH (ref 70–99)
Glucose-Capillary: 168 mg/dL — ABNORMAL HIGH (ref 70–99)
Glucose-Capillary: 166 mg/dL — ABNORMAL HIGH (ref 70–99)

## 2011-08-11 LAB — APTT: aPTT: 30 seconds (ref 24–37)

## 2011-08-11 LAB — HEMATOCRIT
HCT: 31.1 % — ABNORMAL LOW (ref 36.0–46.0)
HCT: 36.1 % (ref 36.0–46.0)

## 2011-08-11 MED ORDER — IOHEXOL 300 MG/ML  SOLN
80.0000 mL | Freq: Once | INTRAMUSCULAR | Status: AC | PRN
  Administered 2011-08-11: 80 mL via INTRAVENOUS

## 2011-08-11 MED ORDER — MORPHINE SULFATE 2 MG/ML IJ SOLN
INTRAMUSCULAR | Status: AC
  Filled 2011-08-11: qty 1

## 2011-08-11 MED ORDER — ONDANSETRON HCL 4 MG/2ML IJ SOLN
4.0000 mg | Freq: Three times a day (TID) | INTRAMUSCULAR | Status: DC | PRN
  Administered 2011-08-11: 4 mg via INTRAVENOUS

## 2011-08-11 MED ORDER — INSULIN ASPART 100 UNIT/ML ~~LOC~~ SOLN
0.0000 [IU] | Freq: Three times a day (TID) | SUBCUTANEOUS | Status: DC
  Administered 2011-08-11: 1 [IU] via SUBCUTANEOUS
  Administered 2011-08-11 – 2011-08-14 (×3): 2 [IU] via SUBCUTANEOUS
  Administered 2011-08-15: 1 [IU] via SUBCUTANEOUS

## 2011-08-11 MED ORDER — MORPHINE SULFATE 2 MG/ML IJ SOLN
2.0000 mg | Freq: Four times a day (QID) | INTRAMUSCULAR | Status: DC | PRN
  Administered 2011-08-11: 2 mg via INTRAVENOUS
  Filled 2011-08-11: qty 1

## 2011-08-11 MED ORDER — PEG-KCL-NACL-NASULF-NA ASC-C 100 G PO SOLR
1.0000 | Freq: Once | ORAL | Status: AC
  Administered 2011-08-11: 100 g via ORAL
  Filled 2011-08-11: qty 1

## 2011-08-11 MED ORDER — POTASSIUM CHLORIDE 10 MEQ/100ML IV SOLN: 10.0000 meq | Freq: Once | INTRAVENOUS | Status: DC

## 2011-08-11 MED ORDER — SODIUM CHLORIDE 0.9 % IV BOLUS (SEPSIS)
1000.0000 mL | Freq: Once | INTRAVENOUS | Status: AC
  Administered 2011-08-11: 20:00:00 via INTRAVENOUS
  Administered 2011-08-11: 1000 mL via INTRAVENOUS

## 2011-08-11 MED ORDER — ONDANSETRON HCL 4 MG/2ML IJ SOLN
INTRAMUSCULAR | Status: AC
  Filled 2011-08-11: qty 2

## 2011-08-11 MED ORDER — MORPHINE SULFATE 2 MG/ML IJ SOLN
2.0000 mg | INTRAMUSCULAR | Status: DC | PRN
  Administered 2011-08-11 – 2011-08-14 (×7): 2 mg via INTRAVENOUS
  Filled 2011-08-11 (×8): qty 1

## 2011-08-11 NOTE — Progress Notes (Signed)
Pt. C/o foul smelling gas, pt assisted to the bathroom. Upon inspection pt was found to have BRBPR. Pt. Assisted back to bed, vital signs obtained pressure 89/63, hr 107. Pt. Asymptomatic at this time. Dr. Rhea Belton notified. Electronic orders received.

## 2011-08-11 NOTE — Consult Note (Signed)
[facility] Progress Note  Subjective: No further bleeding since just before the angiogram. Still with lower abd pain which is cramping in nature, seems to move to right lower flank No n/v Some heartburn this am which is not atypical for her. Very hungry and asking for advanced diet.  Objective:  Vital signs in last 24 hours: Temp:  [97.9 F (36.6 C)-98.7 F (37.1 C)] 98.3 F (36.8 C) (04/07 1201) Pulse Rate:  [51-107] 78  (04/07 0600) Resp:  [9-27] 20  (04/07 1201) BP: (66-147)/(37-100) 126/66 mmHg (04/07 0600) SpO2:  [92 %-100 %] 97 % (04/07 1201) Weight:  [149 lb 11.1 oz (67.9 kg)] 149 lb 11.1 oz (67.9 kg) (04/06 1848) Last BM Date: 08/10/11 Gen: awake, alert, NAD HEENT: anicteric, op clear CV: RRR, no mrg Pulm: CTA b/l Abd: soft, mild TTP lower abd b/l, mild distention, +BS throughout Ext: no c/c/e Neuro: nonfocal   Intake/Output from previous day: 04/06 0701 - 04/07 0700 In: 4402.5 [P.O.:240; I.V.:3000; Blood:1162.5] Out: 1225 [Urine:1225] Intake/Output this shift: Total I/O In: 100 [I.V.:100] Out: 800 [Urine:800]  Lab Results:  Basename 08/11/11 1108 08/10/11 2300 08/10/11 1441 08/10/11 1225  WBC -- 8.8 13.0* 10.9*  HGB 10.3* 9.6* 9.5* --  HCT 31.1* 28.2* 28.4* --  PLT -- 131* 201 261   BMET  Basename 08/10/11 2300 08/10/11 1225  NA 141 137  K 3.7 3.1*  CL 113* 100  CO2 20 23  GLUCOSE 119* 278*  BUN 8 12  CREATININE 0.60 0.78  CALCIUM 7.1* 9.6   LFT  Basename 08/10/11 2300  PROT 4.6*  ALBUMIN 2.5*  AST 13  ALT 9  ALKPHOS 46  BILITOT 0.5  BILIDIR --  IBILI --   PT/INR  Basename 08/10/11 2300 08/10/11 1225  LABPROT 15.5* 13.9  INR 1.20 1.05    Studies/Results: Ir Angiogram Visceral Selective  08/11/2011  *RADIOLOGY REPORT*  Clinical Data: Profuse and acute lower GI bleeding.  1.  ULTRASOUND GUIDANCE FOR VASCULAR ACCESS OF THE RIGHT COMMON FEMORAL ARTERY 2.  SELECTIVE VISCERAL ARTERIOGRAPHY OF THE SUPERIOR MESENTERIC  ARTERY 3.  SELECTIVE VISCERAL ARTERIOGRAPHY OF THE INFERIOR MESENTERIC ARTERY  Comparison:  None  Sedation: 3.5 mg IV Versed; 150 mcg IV Fentanyl.  Total Moderate Sedation Time: 55 minutes.  Contrast:  185 ml Omnipaque-300  Additional Medications: 100 mcg intra-arterial nitroglycerin  Fluoroscopy Time: 11.9 minutes.  Procedure:  The procedure, risks, benefits, and alternatives were explained to the patient.  Questions regarding the procedure were encouraged and answered.  The patient understands and consents to the procedure.  The right groin was prepped with chlorhexidine in a sterile fashion, and a sterile drape was applied covering the operative field.  A sterile gown and sterile gloves were used for the procedure. Local anesthesia was provided with 1% Lidocaine.  Access of the right common femoral artery was performed under ultrasound guidance with a micropuncture access set.  Ultrasound image documentation was performed.  After securing guide wire access, a 5-French vascular sheath was placed.  A 5-French Cobra catheter was then introduced and advanced into the abdominal aorta.  The superior mesenteric artery was selectively catheterized. Contrast injections were performed under fluoroscopy.  The catheter was advanced into the trunk of the SMA.  Selective arteriography was performed in multiple projections including post nitroglycerin administration.  A 5-French Sos catheter was then advanced and used to selectively catheterize the inferior mesenteric artery.  Selective arteriography was performed in different projections to include the entire IMA supply.  The  Cobra catheter was then reintroduced into the superior mesenteric artery and additional selective arteriography performed.  After the procedure catheter and sheath were removed and hemostasis obtained with use of the Cordis ExoSeal device.  Complications: None  Findings: Visceral arteriography demonstrates no evidence of active contrast extravasation in  the superior mesenteric or inferior mesenteric supply.  No evidence of the vascular malformation or angiodysplasia.  No abnormal tumor blush is identified by arteriography.  Normal variant of a replaced right hepatic artery off of the proximal superior mesenteric artery.  In order to optimize bowel evaluation, the selective catheter was advanced beyond the replaced hepatic artery for all of the selective SMA injections.  The superior mesenteric artery supplies the transverse colon.  Inferior mesenteric artery supply begins at the splenic flexure.  Inferior aspect of the IMA supply did demonstrate some atherosclerotic changes as well as branch vessel spasm related to guide wire advancement.  IMPRESSION: No evidence of active lower GI bleed or vascular abnormalities. Extensive interrogation of superior and inferior mesenteric artery supply was performed including relook of the superior mesenteric artery and post nitroglycerin runs to promote vasodilatation.  Original Report Authenticated By: Reola Calkins, M.D.   Dg Abd Portable 1v  08/10/2011  *RADIOLOGY REPORT*  Clinical Data: GI bleed  PORTABLE ABDOMEN - 1 VIEW  Comparison: 02/12/2010  Findings: Nonobstructive bowel gas pattern.  Excretory contrast in the bilateral renal collecting systems and bladder.  Cholecystectomy clips.  Surgical clips at the GE junction and overlying the left sacrum.  Mild degenerative changes of the visualized thoracolumbar spine.  IMPRESSION: No evidence of bowel obstruction.  Excretory contrast in the bilateral renal collecting systems and bladder.  Original Report Authenticated By: Charline Bills, M.D.    Assessment / Plan: 56 y.o. female with hx of HTN, DM, CVA, CAD without hx of PCI on plavix, IBS, GERD, hidradenitis, and diverticulosis (hepatic flexure to sigmoid) who presents with several hours of painless hematochezia with subsequent development of hypotension and acute post-hemorrhage anemia felt to be secondary to  diverticular hemorrhage.  1. LGI bleeding -- no further bleeding and Hgb is now stable, and slightly increased from yesterday.  No transfusion since ED (before and during angiogram).  Angiogram was negative, indicating the bleeding had stopped prior to this study, which now fits clinically.  Top of differential remains diverticular hemorrhage.  She is having some lower abd pain, which is not relieved by passing gas.  She has had this pain before, and she remembers being told his "might be my pancreas".  Clinically this does not fit with pancreatitis, but she is requiring morphine.  Will CT abd for further evaluation.  She does need colonoscopy given her recent bleeding, but not right now. Timing to be determined by clinical course. --Continue q12h Hgb/HCT --Hold plavix --CT abd/pelvis given abd pain, prn morphine --will check lipase with next Hgb --supportive care, timing of colonoscopy to be determined by clinical course (possible after d/c)   Active Problems:  Acute lower GI bleeding  Diverticulosis of colon with hemorrhage  Acute posthemorrhagic anemia     LOS: 1 day   Joceline Hinchcliff M  08/11/2011, 12:22 PM

## 2011-08-11 NOTE — Progress Notes (Signed)
Subjective:  Patient complains of occasional crampy lower abdominal pain states overall feels better no further episodes of bright red blood per rectum although no BM yet. Lab work from this a.m. are still pending  Objective:  Vital Signs in the last 24 hours: Temp:  [97.9 F (36.6 C)-98.7 F (37.1 C)] 98.7 F (37.1 C) (04/07 0740) Pulse Rate:  [51-145] 78  (04/07 0600) Resp:  [9-27] 27  (04/07 0600) BP: (66-147)/(37-100) 126/66 mmHg (04/07 0600) SpO2:  [92 %-100 %] 94 % (04/07 0600) Weight:  [63.957 kg (141 lb)-67.9 kg (149 lb 11.1 oz)] 67.9 kg (149 lb 11.1 oz) (04/06 1848)  Intake/Output from previous day: 04/06 0701 - 04/07 0700 In: 4402.5 [P.O.:240; I.V.:3000; Blood:1162.5] Out: 1225 [Urine:1225] Intake/Output from this shift: Total I/O In: 100 [I.V.:100] Out: 500 [Urine:500]  Physical Exam: Neck: no adenopathy, no carotid bruit, no JVD and supple, symmetrical, trachea midline Lungs: clear to auscultation bilaterally Heart: regular rate and rhythm, S1, S2 normal, no murmur, click, rub or gallop Abdomen: soft, non-tender; bowel sounds normal; no masses,  no organomegaly Extremities: extremities normal, atraumatic, no cyanosis or edema  Lab Results:  Basename 08/10/11 2300 08/10/11 1441  WBC 8.8 13.0*  HGB 9.6* 9.5*  PLT 131* 201    Basename 08/10/11 2300 08/10/11 1225  NA 141 137  K 3.7 3.1*  CL 113* 100  CO2 20 23  GLUCOSE 119* 278*  BUN 8 12  CREATININE 0.60 0.78   No results found for this basename: TROPONINI:2,CK,MB:2 in the last 72 hours Hepatic Function Panel  Basename 08/10/11 2300  PROT 4.6*  ALBUMIN 2.5*  AST 13  ALT 9  ALKPHOS 46  BILITOT 0.5  BILIDIR --  IBILI --   No results found for this basename: CHOL in the last 72 hours No results found for this basename: PROTIME in the last 72 hours  Imaging: Imaging results have been reviewed and Dg Abd Portable 1v  08/10/2011  *RADIOLOGY REPORT*  Clinical Data: GI bleed  PORTABLE ABDOMEN - 1 VIEW   Comparison: 02/12/2010  Findings: Nonobstructive bowel gas pattern.  Excretory contrast in the bilateral renal collecting systems and bladder.  Cholecystectomy clips.  Surgical clips at the GE junction and overlying the left sacrum.  Mild degenerative changes of the visualized thoracolumbar spine.  IMPRESSION: No evidence of bowel obstruction.  Excretory contrast in the bilateral renal collecting systems and bladder.  Original Report Authenticated By: Charline Bills, M.D.    Cardiac Studies:  Assessment/Plan:  Status post acute lower GI probable diverticular bleed status post SMA and IMA arteriography Hypertension  Non-insulin-dependent diabetes mellitus  History of irritable bowel syndrome  History of CVA  History of questionable silent MI in the past  Tobacco abuse  At this hernia  History of remote cocaine abuse  Hypercholesteremia Plan Check labs and transfuse if needed Further workup per GI Dr. Algie Coffer to follow from him  LOS: 1 day    [facility] N 08/11/2011, 9:56 AM

## 2011-08-12 ENCOUNTER — Encounter (HOSPITAL_COMMUNITY)
Admission: EM | Disposition: A | Discharge status: Home / Self Care | Payer: Self-pay | Source: Home / Self Care | Attending: Cardiovascular Disease

## 2011-08-12 ENCOUNTER — Encounter (HOSPITAL_COMMUNITY): Payer: Self-pay | Admitting: *Deleted

## 2011-08-12 DIAGNOSIS — K5730 Diverticulosis of large intestine without perforation or abscess without bleeding: Secondary | ICD-10-CM

## 2011-08-12 HISTORY — PX: COLONOSCOPY: SHX5424

## 2011-08-12 HISTORY — PX: ESOPHAGOGASTRODUODENOSCOPY: SHX5428

## 2011-08-12 LAB — COMPREHENSIVE METABOLIC PANEL
ALT: 10 U/L (ref 0–35)
AST: 15 U/L (ref 0–37)
Albumin: 3.1 g/dL — ABNORMAL LOW (ref 3.5–5.2)
Alkaline Phosphatase: 53 U/L (ref 39–117)
BUN: 4 mg/dL — ABNORMAL LOW (ref 6–23)
CO2: 25 mEq/L (ref 19–32)
Calcium: 8.7 mg/dL (ref 8.4–10.5)
Chloride: 110 mEq/L (ref 96–112)
Creatinine, Ser: 0.54 mg/dL (ref 0.50–1.10)
GFR calc Af Amer: >90 mL/min (ref >90)
GFR calc non Af Amer: >90 mL/min (ref >90)
Glucose, Bld: 120 mg/dL — ABNORMAL HIGH (ref 70–99)
Potassium: 3.3 mEq/L — ABNORMAL LOW (ref 3.5–5.1)
Sodium: 144 mEq/L (ref 135–145)
Total Bilirubin: 0.5 mg/dL (ref 0.3–1.2)
Total Protein: 5.7 g/dL — ABNORMAL LOW (ref 6.0–8.3)

## 2011-08-12 LAB — HEMOGLOBIN
Hemoglobin: 11 g/dL — ABNORMAL LOW (ref 12.0–15.0)
Hemoglobin: 10.8 g/dL — ABNORMAL LOW (ref 12.0–15.0)

## 2011-08-12 LAB — GLUCOSE, CAPILLARY
Glucose-Capillary: 141 mg/dL — ABNORMAL HIGH (ref 70–99)
Glucose-Capillary: 91 mg/dL (ref 70–99)
Glucose-Capillary: 87 mg/dL (ref 70–99)

## 2011-08-12 LAB — LIPASE, BLOOD: Lipase: 19 U/L (ref 11–59)

## 2011-08-12 LAB — HEMATOCRIT
HCT: 32.4 % — ABNORMAL LOW (ref 36.0–46.0)
HCT: 31.4 % — ABNORMAL LOW (ref 36.0–46.0)

## 2011-08-12 SURGERY — COLONOSCOPY
Anesthesia: Moderate Sedation

## 2011-08-12 MED ORDER — MIDAZOLAM HCL 10 MG/2ML IJ SOLN
INTRAMUSCULAR | Status: DC | PRN
  Administered 2011-08-12: 1 mg via INTRAVENOUS
  Administered 2011-08-12 (×3): 2 mg via INTRAVENOUS
  Administered 2011-08-12: 1 mg via INTRAVENOUS

## 2011-08-12 MED ORDER — POTASSIUM CHLORIDE CRYS ER 10 MEQ PO TBCR
10.0000 meq | EXTENDED_RELEASE_TABLET | Freq: Three times a day (TID) | ORAL | Status: AC
  Administered 2011-08-12 – 2011-08-13 (×6): 10 meq via ORAL
  Filled 2011-08-12 (×7): qty 1

## 2011-08-12 MED ORDER — SODIUM CHLORIDE 0.9 % IV SOLN
Freq: Once | INTRAVENOUS | Status: AC
  Administered 2011-08-12: 500 mL via INTRAVENOUS

## 2011-08-12 MED ORDER — MIDAZOLAM HCL 10 MG/2ML IJ SOLN
INTRAMUSCULAR | Status: AC
  Filled 2011-08-12: qty 4

## 2011-08-12 MED ORDER — FENTANYL CITRATE 0.05 MG/ML IJ SOLN
INTRAMUSCULAR | Status: AC
  Filled 2011-08-12: qty 4

## 2011-08-12 MED ORDER — BUTAMBEN-TETRACAINE-BENZOCAINE 2-2-14 % EX AERO
INHALATION_SPRAY | CUTANEOUS | Status: DC | PRN
  Administered 2011-08-12: 2 via TOPICAL

## 2011-08-12 MED ORDER — FENTANYL NICU IV SYRINGE 50 MCG/ML
INJECTION | INTRAMUSCULAR | Status: DC | PRN
  Administered 2011-08-12 (×3): 25 ug via INTRAVENOUS

## 2011-08-12 NOTE — Op Note (Signed)
[facility] [address] [place], [place]  [address]  COLONOSCOPY PROCEDURE REPORT  PATIENT:  [name], [name]  MR#:  [id number] BIRTHDATE:  10/16/1955, 55 yrs. old  GENDER:  female ENDOSCOPIST:  Judie Petit T. Russella Dar, MD, [facility]  PROCEDURE DATE:  08/12/2011 PROCEDURE:  Colonoscopy 62952 ASA CLASS:  Class II INDICATIONS:  1) Gastrointestinal hemorrhage  2) Anemia MEDICATIONS:   These medications were titrated to patient response per physician's verbal order, Fentanyl 100 mcg IV, Versed 7 mg IV DESCRIPTION OF PROCEDURE:   After the risks benefits and alternatives of the procedure were thoroughly explained, informed consent was obtained.  Digital rectal exam was performed and revealed no abnormalities.   The Pentax Ped Colon [id number] endoscope was introduced through the anus and advanced to the terminal ileum, the cecum was identified by both the appendix and ileocecal valve, without limitations.  The quality of the prep was good, using MoviPrep.  The instrument was then slowly withdrawn as the colon was fully examined. <<PROCEDUREIMAGES>> FINDINGS:  Scattered diverticula were found in the transverse colon.  Mild diverticulosis was found in the sigmoid to descending colon.  The terminal ileum appeared normal. Otherwise normal colonoscopy without other polyps, masses, vascular ectasias, or inflammatory changes.   Retroflexed views in the rectum revealed no abnormalities. No blood in colon or TI.  The time to cecum =  6 minutes. The scope was then withdrawn (time =  12  min) from the patient and the procedure completed.  COMPLICATIONS:  None  ENDOSCOPIC IMPRESSION: 1) Diverticula, scattered in the transverse colon 2) Mild diverticulosis in the sigmoid to descending colon 3) Normal terminal ileum  RECOMMENDATIONS: 1) High fiber diet with liberal fluid intake. 2) EGD today  Reeve Mallo T. Russella Dar, MD, [facility]  CC:  Stan Head, MD  n. Rosalie DoctorVenita Lick.  Kinley Dozier at 08/12/2011 01:36 PM  Raymond Gurney, [phone]

## 2011-08-12 NOTE — Op Note (Signed)
[facility] [address] [place], [place]  [address]  ENDOSCOPY PROCEDURE REPORT  PATIENT:  [name], [name]  MR#:  [id number] BIRTHDATE:  29-Nov-1955, 55 yrs. old  GENDER:  female ENDOSCOPIST:  Judie Petit T. Russella Dar, MD, [facility]  PROCEDURE DATE:  08/12/2011 PROCEDURE:  EGD, diagnostic 43235 ASA CLASS:  Class II INDICATIONS:  hemorrhage of GI tract, anemia MEDICATIONS:  There was residual sedation effect present from prior procedure, These medications were titrated to patient response per physician's verbal order, Versed 1 mg IV TOPICAL ANESTHETIC:  Cetacaine Spray DESCRIPTION OF PROCEDURE:   After the risks benefits and alternatives of the procedure were thoroughly explained, informed consent was obtained.  The Pentax Gastroscope [id number] endoscope was introduced through the mouth and advanced to the third portion of the duodenum, without limitations.  The instrument was slowly withdrawn as the mucosa was fully examined. <<PROCEDUREIMAGES>> A Schatzki's ring was found at the gastroesophageal junction. Otherwise normal esophagus.  Prior fundoplication.  Otherwise normal stomach.  The duodenal bulb was normal in appearance, as was the postbulbar duodenum.  Retroflexed views revealed prior fundoplication.  No blood in UGI tract. The scope was then withdrawn from the patient and the procedure completed.  COMPLICATIONS:  None  ENDOSCOPIC IMPRESSION: 1) Schatzki's ring 2) Prior fundoplication  RECOMMENDATIONS: 1) Capsule endoscopy  Dariel Betzer T. Russella Dar, MD, [facility]  CC:  Stan Head, MD  n. Rosalie DoctorVenita Lick. Vishal Sandlin at 08/12/2011 01:46 PM  Raymond Gurney, [phone]

## 2011-08-12 NOTE — Interval H&P Note (Signed)
History and Physical Interval Note:  08/12/2011 1:05 PM  [name]  has presented today for surgery, with the diagnosis of lower gi bleeding  The various methods of treatment have been discussed with the patient and family. After consideration of risks, benefits and other options for treatment, the patient has consented to  Procedure(s) (LRB): COLONOSCOPY (N/A) ESOPHAGOGASTRODUODENOSCOPY (EGD) (N/A) as a surgical intervention .  The patients' history has been reviewed, patient examined, no change in status, stable for surgery.  I have reviewed the patients' chart and labs.  Questions were answered to the patient's satisfaction.     Venita Lick. Russella Dar MD [facility]

## 2011-08-12 NOTE — Progress Notes (Signed)
Subjective:  Awaiting colonoscopy. No chest pain or abdominal pain or nausea/vomiting. Borderline temp of 99.2 F  Objective:  Vital Signs in the last 24 hours: Temp:  [98.3 F (36.8 C)-99.2 F (37.3 C)] 99.2 F (37.3 C) (04/08 0822) Pulse Rate:  [79] 79  (04/08 1610) Cardiac Rhythm:  [-] Normal sinus rhythm (04/08 0400) Resp:  [20] 20  (04/07 1201) BP: (110-148)/(57-80) 120/69 mmHg (04/08 0715) SpO2:  [96 %-99 %] 96 % (04/08 9604)  Physical Exam: BP Readings from Last 1 Encounters:  08/12/11 120/69    Wt Readings from Last 1 Encounters:  08/10/11 67.9 kg (149 lb 11.1 oz)    Weight change:   HEENT: [place]/AT, Eyes-Brown, PERL, EOMI, Conjunctiva-Pale pink, Sclera-Non-icteric Neck: No JVD, No bruit, Trachea midline. Lungs:  Clear, Bilateral. Cardiac:  Regular rhythm, normal S1 and S2, no S3.  Abdomen:  Soft, non-tender. Increased bowel sounds. Extremities:  No edema present. No cyanosis. No clubbing. CNS: AxOx3, Cranial nerves grossly intact, moves all 4 extremities. Right handed. Skin: Warm and dry.   Intake/Output from previous day: 04/07 0701 - 04/08 0700 In: 4998 [P.O.:2850; I.V.:1150; IV Piggyback:998] Out: 5900 [Urine:4725; Stool:1175]    Lab Results: BMET    Component Value Date/Time   NA 144 08/12/2011 0459   K 3.3* 08/12/2011 0459   CL 110 08/12/2011 0459   CO2 25 08/12/2011 0459   GLUCOSE 120* 08/12/2011 0459   BUN 4* 08/12/2011 0459   CREATININE 0.54 08/12/2011 0459   CALCIUM 8.7 08/12/2011 0459   GFRNONAA >90 08/12/2011 0459   GFRAA >90 08/12/2011 0459   CBC    Component Value Date/Time   WBC 11.4* 08/11/2011 1732   RBC 4.01 08/11/2011 1732   HGB 12.1 08/11/2011 2231   HCT 36.1 08/11/2011 2231   PLT 144* 08/11/2011 1732   MCV 81.8 08/11/2011 1732   MCH 27.7 08/11/2011 1732   MCHC 33.8 08/11/2011 1732   RDW 13.7 08/11/2011 1732   LYMPHSABS 2.7 08/10/2011 1225   MONOABS 0.5 08/10/2011 1225   EOSABS 0.1 08/10/2011 1225   BASOSABS 0.0 08/10/2011 1225   CARDIAC ENZYMES Lab Results    Component Value Date   CKTOTAL 63 07/06/2011   CKMB 1.5 07/06/2011   TROPONINI <0.30 07/06/2011    Assessment/Plan:  Patient Active Hospital Problem List: Acute lower GI bleeding (08/10/2011)   Assessment: Stable   Plan: per GI. Off plavix Diverticulosis of colon with hemorrhage (08/10/2011)   Assessment: Stable   Plan: per GI Acute posthemorrhagic anemia (08/10/2011)   Assessment: Stable   Plan: Monitor H/H     LOS: 2 days    Orpah Cobb  MD  08/12/2011, 8:24 AM

## 2011-08-13 ENCOUNTER — Encounter (HOSPITAL_COMMUNITY)
Admission: EM | Disposition: A | Discharge status: Home / Self Care | Payer: Self-pay | Source: Home / Self Care | Attending: Cardiovascular Disease

## 2011-08-13 ENCOUNTER — Encounter (HOSPITAL_COMMUNITY): Payer: Self-pay | Admitting: Gastroenterology

## 2011-08-13 HISTORY — PX: GIVENS CAPSULE STUDY: SHX5432

## 2011-08-13 LAB — BASIC METABOLIC PANEL
BUN: 3 mg/dL — ABNORMAL LOW (ref 6–23)
CO2: 24 mEq/L (ref 19–32)
Calcium: 8.5 mg/dL (ref 8.4–10.5)
Chloride: 107 mEq/L (ref 96–112)
Creatinine, Ser: 0.54 mg/dL (ref 0.50–1.10)
GFR calc Af Amer: >90 mL/min (ref >90)
GFR calc non Af Amer: >90 mL/min (ref >90)
Glucose, Bld: 92 mg/dL (ref 70–99)
Potassium: 3.7 mEq/L (ref 3.5–5.1)
Sodium: 140 mEq/L (ref 135–145)

## 2011-08-13 LAB — GLUCOSE, CAPILLARY
Glucose-Capillary: 102 mg/dL — ABNORMAL HIGH (ref 70–99)
Glucose-Capillary: 164 mg/dL — ABNORMAL HIGH (ref 70–99)
Glucose-Capillary: 111 mg/dL — ABNORMAL HIGH (ref 70–99)
Glucose-Capillary: 152 mg/dL — ABNORMAL HIGH (ref 70–99)

## 2011-08-13 LAB — HEMATOCRIT: HCT: 32.8 % — ABNORMAL LOW (ref 36.0–46.0)

## 2011-08-13 LAB — HEMOGLOBIN: Hemoglobin: 11.1 g/dL — ABNORMAL LOW (ref 12.0–15.0)

## 2011-08-13 SURGERY — IMAGING PROCEDURE, GI TRACT, INTRALUMINAL, VIA CAPSULE
Anesthesia: LOCAL

## 2011-08-13 MED ORDER — PANTOPRAZOLE SODIUM 40 MG PO TBEC
40.0000 mg | DELAYED_RELEASE_TABLET | Freq: Every day | ORAL | Status: DC
  Administered 2011-08-14 – 2011-08-16 (×3): 40 mg via ORAL
  Filled 2011-08-13 (×3): qty 1

## 2011-08-13 SURGICAL SUPPLY — 1 items: TOWEL COTTON PACK 4EA (MISCELLANEOUS) ×4 IMPLANT

## 2011-08-13 NOTE — Progress Notes (Signed)
I have taken an interval history, reviewed the chart and examined the patient. I agree with the extender's note, impression and recommendations. No recurrent bleeding. Capsule endo report should be ready by tomorrow morning.  Venita Lick. Russella Dar MD [facility]

## 2011-08-13 NOTE — Progress Notes (Signed)
Subjective:  Feeling better. No chest pain or abdominal pain. Undergoing capsule endoscopy.  Objective:  Vital Signs in the last 24 hours: Temp:  [98.1 F (36.7 C)-98.5 F (36.9 C)] 98.1 F (36.7 C) (04/09 0800) Pulse Rate:  [47-91] 59  (04/09 0826) Cardiac Rhythm:  [-] Normal sinus rhythm (04/08 2200) Resp:  [12-53] 18  (04/08 1925) BP: (110-205)/(51-134) 110/51 mmHg (04/09 0826) SpO2:  [96 %-100 %] 97 % (04/09 0826)  Physical Exam: BP Readings from Last 1 Encounters:  08/13/11 110/51    Wt Readings from Last 1 Encounters:  08/10/11 67.9 kg (149 lb 11.1 oz)    Weight change:   HEENT: [place]/AT, Eyes-Brown, PERL, EOMI, Conjunctiva-Pale pink, Sclera-Non-icteric Neck: No JVD, No bruit, Trachea midline. Lungs:  Clear, Bilateral. Cardiac:  Regular rhythm, normal S1 and S2, no S3.  Abdomen:  Soft, non-tender. Extremities:  No edema present. No cyanosis. No clubbing. CNS: AxOx3, Cranial nerves grossly intact, moves all 4 extremities. Right handed. Skin: Warm and dry.   Intake/Output from previous day: 04/08 0701 - 04/09 0700 In: 1160 [P.O.:960; I.V.:200] Out: 1200 [Urine:1200]    Lab Results: BMET    Component Value Date/Time   NA 140 08/13/2011 0515   K 3.7 08/13/2011 0515   CL 107 08/13/2011 0515   CO2 24 08/13/2011 0515   GLUCOSE 92 08/13/2011 0515   BUN 3* 08/13/2011 0515   CREATININE 0.54 08/13/2011 0515   CALCIUM 8.5 08/13/2011 0515   GFRNONAA >90 08/13/2011 0515   GFRAA >90 08/13/2011 0515   CBC    Component Value Date/Time   WBC 11.4* 08/11/2011 1732   RBC 4.01 08/11/2011 1732   HGB 10.8* 08/12/2011 2258   HCT 31.4* 08/12/2011 2258   PLT 144* 08/11/2011 1732   MCV 81.8 08/11/2011 1732   MCH 27.7 08/11/2011 1732   MCHC 33.8 08/11/2011 1732   RDW 13.7 08/11/2011 1732   LYMPHSABS 2.7 08/10/2011 1225   MONOABS 0.5 08/10/2011 1225   EOSABS 0.1 08/10/2011 1225   BASOSABS 0.0 08/10/2011 1225   CARDIAC ENZYMES Lab Results  Component Value Date   CKTOTAL 63 07/06/2011   CKMB 1.5 07/06/2011   TROPONINI  <0.30 07/06/2011    Assessment/Plan:  Patient Active Hospital Problem List: Acute lower GI bleeding (08/10/2011)   Assessment: Stable   Plan: Undergoing capsule endoscopy Diverticulosis of colon with hemorrhage (08/10/2011)   Assessment: Stable Acute posthemorrhagic anemia (08/10/2011)   Assessment: Stable   Plan: Continue monitor H/H. Transfer to Telebed    LOS: 3 days    Orpah Cobb  MD  08/13/2011, 9:26 AM

## 2011-08-13 NOTE — Progress Notes (Signed)
     [facility] Daily Rounding Note 08/13/2011, 10:12 AM  SUBJECTIVE:       No further stools.  No dizzyness.  Some mild discomfort in upper abdomen.  No n/v.  Tolerating solids.   OBJECTIVE:        General: looks well     Vital signs in last 24 hours:    Temp:  [98.1 F (36.7 C)-98.5 F (36.9 C)] 98.1 F (36.7 C) (04/09 0800) Pulse Rate:  [47-91] 59  (04/09 0826) Resp:  [12-53] 18  (04/08 1925) BP: (110-205)/(51-134) 110/51 mmHg (04/09 0826) SpO2:  [96 %-100 %] 97 % (04/09 0826) Last BM Date: 08/12/11  Heart: RRR Chest: clear B.  Breathing non-labored Abdomen: soft, NT, ND.  BS active  Extremities: no pedal edema Neuro/Psych:  Pleasant, non-agitated. Energetic.   Intake/Output from previous day: 04/08 0701 - 04/09 0700 In: 1160 [P.O.:960; I.V.:200] Out: 1200 [Urine:1200]  Intake/Output this shift: Total I/O In: 240 [P.O.:240] Out: 150 [Urine:150]  Lab Results:  Basename 08/12/11 2258 08/12/11 1023 08/11/11 2231 08/11/11 1732 08/10/11 2300 08/10/11 1441  WBC -- -- -- 11.4* 8.8 13.0*  HGB 10.8* 11.0* 12.1 -- -- --  HCT 31.4* 32.4* 36.1 -- -- --  PLT -- -- -- 144* 131* 201   BMET  Basename 08/13/11 0515 08/12/11 0459 08/10/11 2300  NA 140 144 141  K 3.7 3.3* 3.7  CL 107 110 113*  CO2 24 25 20   GLUCOSE 92 120* 119*  BUN 3* 4* 8  CREATININE 0.54 0.54 0.60  CALCIUM 8.5 8.7 7.1*   LFT  Basename 08/12/11 0459 08/10/11 2300 08/10/11 1225  PROT 5.7* 4.6* 7.3  ALBUMIN 3.1* 2.5* 3.9  AST 15 13 15   ALT 10 9 12   ALKPHOS 53 46 71  BILITOT 0.5 0.5 0.7  BILIDIR -- -- --  IBILI -- -- --   PT/INR  Basename 08/10/11 2300 08/10/11 1225  LABPROT 15.5* 13.9  INR 1.20 1.05   Studies/Results: Ct Entero Abd/pelvis W/cm  08/12/2011  *RADIOLOGY REPORT*  Clinical Data:  Lower gastrointestinal bleed  CT ABDOMEN AND PELVIS WITH CONTRAST (CT ENTEROGRAPHY)   IMPRESSION:  1.  No evidence of active bowel hemorrhage in the colon. 2.  Small high density foci within the proximal  small bowel and to a lesser extent the distal small bowel.  Favor these to represent enteric contents related to either medication or less likely oral contrast.  Cannot fully exclude  mucosal lesions.  Consider a follow-up CT enterography or potentially a small bowel optical capsule study. 3.  Small focus of atelectasis versus infection in the right lower lobe with small effusion.  Original Report Authenticated By: Genevive Bi, M.D.    ASSESMENT: 1.  GI bleed with painless hematochezia, hypotension. ? Diverticular bleed. Bleeding has resolved.  Colonoscopy, EGD:  Prior fundoplication, schatzki's ring, scattered transverse/sigmoid/descending diverticulosis.  Capsule endo in progress.  2.  Plavix therapy, chronic.  Hx CVA. 3.  Anemia. Due to acute blood loss. S/P 2 units PRBCs on 08/10/11. 4.  Type 2 DM.  PLAN: 1.  Finishes capsule endo later today.  I asked pt to walk, as this results in better study. 2.   Change to Protonix, no UGI mucosal dz on EGD, but taking Nexium PTA.   LOS: 3 days   Jennye Moccasin  08/13/2011, 10:12 AM Pager: [phone]

## 2011-08-14 ENCOUNTER — Encounter (HOSPITAL_COMMUNITY): Payer: Self-pay | Admitting: Gastroenterology

## 2011-08-14 LAB — TYPE AND SCREEN
ABO/RH(D): B POS
Antibody Screen: POSITIVE
DAT, IgG: NEGATIVE
Donor AG Type: NEGATIVE
Donor AG Type: NEGATIVE
Donor AG Type: NEGATIVE
Donor AG Type: NEGATIVE
Donor AG Type: NEGATIVE
Donor AG Type: NEGATIVE
PT AG Type: NEGATIVE
Unit division: 0
Unit division: 0
Unit division: 0
Unit division: 0
Unit division: 0
Unit division: 0

## 2011-08-14 LAB — GLUCOSE, CAPILLARY
Glucose-Capillary: 100 mg/dL — ABNORMAL HIGH (ref 70–99)
Glucose-Capillary: 114 mg/dL — ABNORMAL HIGH (ref 70–99)
Glucose-Capillary: 153 mg/dL — ABNORMAL HIGH (ref 70–99)
Glucose-Capillary: 161 mg/dL — ABNORMAL HIGH (ref 70–99)

## 2011-08-14 LAB — CBC
HCT: 33.4 % — ABNORMAL LOW (ref 36.0–46.0)
Hemoglobin: 11.4 g/dL — ABNORMAL LOW (ref 12.0–15.0)
MCH: 28.3 pg (ref 26.0–34.0)
MCHC: 34.1 g/dL (ref 30.0–36.0)
MCV: 82.9 fL (ref 78.0–100.0)
Platelets: 176 10*3/uL (ref 150–400)
RBC: 4.03 MIL/uL (ref 3.87–5.11)
RDW: 13.5 % (ref 11.5–15.5)
WBC: 6.4 10*3/uL (ref 4.0–10.5)

## 2011-08-14 LAB — BASIC METABOLIC PANEL
BUN: 6 mg/dL (ref 6–23)
CO2: 25 mEq/L (ref 19–32)
Calcium: 9.4 mg/dL (ref 8.4–10.5)
Chloride: 107 mEq/L (ref 96–112)
Creatinine, Ser: 0.59 mg/dL (ref 0.50–1.10)
GFR calc Af Amer: >90 mL/min (ref >90)
GFR calc non Af Amer: >90 mL/min (ref >90)
Glucose, Bld: 220 mg/dL — ABNORMAL HIGH (ref 70–99)
Potassium: 4.1 mEq/L (ref 3.5–5.1)
Sodium: 143 mEq/L (ref 135–145)

## 2011-08-14 MED ORDER — SIMETHICONE 80 MG PO CHEW
80.0000 mg | CHEWABLE_TABLET | Freq: Four times a day (QID) | ORAL | Status: DC | PRN
  Administered 2011-08-14 – 2011-08-15 (×3): 80 mg via ORAL
  Filled 2011-08-14 (×4): qty 1

## 2011-08-14 MED ORDER — DOCUSATE SODIUM 100 MG PO CAPS
100.0000 mg | ORAL_CAPSULE | Freq: Every day | ORAL | Status: DC
  Administered 2011-08-14 – 2011-08-16 (×3): 100 mg via ORAL
  Filled 2011-08-14 (×2): qty 1

## 2011-08-14 MED ORDER — HYDROCODONE-ACETAMINOPHEN 5-325 MG PO TABS
1.0000 | ORAL_TABLET | Freq: Four times a day (QID) | ORAL | Status: DC | PRN
  Administered 2011-08-14 – 2011-08-15 (×4): 1 via ORAL
  Filled 2011-08-14 (×4): qty 1

## 2011-08-14 NOTE — Progress Notes (Signed)
      Gi Daily Rounding Note 08/14/2011, 10:19 AM  SUBJECTIVE:       No stools since bowel prep.  Having some epigastric discomfort, triggered by meatloaf last PM.  Not severe pain, feels bloated.  No n/v.  Wondering if she can get something po for pain.   OBJECTIVE:        General: looks well     Vital signs in last 24 hours:    Temp:  [98 F (36.7 C)-98.6 F (37 C)] 98.2 F (36.8 C) (04/10 0606) Pulse Rate:  [59-65] 59  (04/10 0606) Resp:  [16-20] 16  (04/10 0606) BP: (114-130)/(68-76) 114/76 mmHg (04/10 0606) SpO2:  [97 %-100 %] 100 % (04/10 0606) Last BM Date: 08/13/11  Heart: RRR Chest: clear B.  No resp distress. Abdomen: soft, ND, NT, BS active.   Extremities: no pedal edema.  Neuro/Psych:  Pleasant, inquisitive, appropriate, anxious.   Intake/Output from previous day: 04/09 0701 - 04/10 0700 In: 1806 [P.O.:1800; I.V.:6] Out: 1700 [Urine:1700]  Intake/Output this shift:    Lab Results:  Basename 08/14/11 0854 08/13/11 1105 08/12/11 2258 08/11/11 1732  WBC 6.4 -- -- 11.4*  HGB 11.4* 11.1* 10.8* --  HCT 33.4* 32.8* 31.4* --  PLT 176 -- -- 144*   BMET  Basename 08/14/11 0854 08/13/11 0515 08/12/11 0459  NA 143 140 144  K 4.1 3.7 3.3*  CL 107 107 110  CO2 25 24 25   GLUCOSE 220* 92 120*  BUN 6 3* 4*  CREATININE 0.59 0.54 0.54  CALCIUM 9.4 8.5 8.7   LFT  Basename 08/12/11 0459  PROT 5.7*  ALBUMIN 3.1*  AST 15  ALT 10  ALKPHOS 53  BILITOT 0.5  BILIDIR --  IBILI --   Studies/Results: Capsule endoscopy:  08/14/11 Unrevealing as to source of hematochezia.   ASSESMENT: 1. GI bleed with painless hematochezia, hypotension. ? Diverticular bleed. Bleeding has resolved.  Colonoscopy, EGD: Prior fundoplication, schatzki's ring, scattered transverse/sigmoid/descending diverticulosis.  Capsule endo unrevealing Meckel's scan ordered, but delayed until 08/15/11 because she had eaten AM meal .  2. Plavix therapy, chronic. Hx CVA.  3. Anemia. Due to  acute blood loss. S/P 2 units PRBCs on 08/10/11.  4. Type 2 DM. 5.  IBS. Alternates constipation to diarrhea.    PLAN: 1.  Mecklel's scan tomorrow.  NPO tonight.  2.  Add stool softener, prn simethicone.  Stop morphine, added q 6 hour PRN Vicodin but should not go home on this or on any narcotics.     LOS: 4 days   Jennye Moccasin  08/14/2011, 10:19 AM Pager: [phone]

## 2011-08-14 NOTE — Progress Notes (Signed)
UR Completed. Simmons, Saher Davee F [phone]  

## 2011-08-14 NOTE — Progress Notes (Signed)
I have taken an interval history, reviewed the chart and examined the patient. I agree with the extender's note, impression and recommendations. Await Meckel's scan to complete the GI work up. She will need to discuss the long term risks and benefits to resuming Plavix with her Cardiologist shortly after discharge. Given her recent GI bleed, it would be ideal to hold Plavix for at least 7 days after discharge.  Venita Lick. Russella Dar MD [facility] Graham

## 2011-08-14 NOTE — Progress Notes (Signed)
Subjective:  Epigastric discomfort in AM. No additional GI bleed. H/H stable.  Objective:  Vital Signs in the last 24 hours: Temp:  [98.2 F (36.8 C)-98.6 F (37 C)] 98.4 F (36.9 C) (04/10 1417) Pulse Rate:  [53-61] 53  (04/10 1417) Cardiac Rhythm:  [-] Normal sinus rhythm (04/10 0825) Resp:  [16-20] 20  (04/10 1417) BP: (104-121)/(64-76) 104/64 mmHg (04/10 1417) SpO2:  [96 %-100 %] 96 % (04/10 1417)  Physical Exam: BP Readings from Last 1 Encounters:  08/14/11 104/64    Wt Readings from Last 1 Encounters:  08/10/11 67.9 kg (149 lb 11.1 oz)    Weight change:   HEENT: [place]/AT, Eyes-Brown, PERL, EOMI, Conjunctiva-Pink, Sclera-Non-icteric Neck: No JVD, No bruit, Trachea midline. Lungs:  Clear, Bilateral. Cardiac:  Regular rhythm, normal S1 and S2, no S3.  Abdomen:  Soft, non-tender. Extremities:  No edema present. No cyanosis. No clubbing. CNS: AxOx3, Cranial nerves grossly intact, moves all 4 extremities. Right handed. Skin: Warm and dry.   Intake/Output from previous day: 04/09 0701 - 04/10 0700 In: 1806 [P.O.:1800; I.V.:6] Out: 1700 [Urine:1700]    Lab Results: BMET    Component Value Date/Time   NA 143 08/14/2011 0854   K 4.1 08/14/2011 0854   CL 107 08/14/2011 0854   CO2 25 08/14/2011 0854   GLUCOSE 220* 08/14/2011 0854   BUN 6 08/14/2011 0854   CREATININE 0.59 08/14/2011 0854   CALCIUM 9.4 08/14/2011 0854   GFRNONAA >90 08/14/2011 0854   GFRAA >90 08/14/2011 0854   CBC    Component Value Date/Time   WBC 6.4 08/14/2011 0854   RBC 4.03 08/14/2011 0854   HGB 11.4* 08/14/2011 0854   HCT 33.4* 08/14/2011 0854   PLT 176 08/14/2011 0854   MCV 82.9 08/14/2011 0854   MCH 28.3 08/14/2011 0854   MCHC 34.1 08/14/2011 0854   RDW 13.5 08/14/2011 0854   LYMPHSABS 2.7 08/10/2011 1225   MONOABS 0.5 08/10/2011 1225   EOSABS 0.1 08/10/2011 1225   BASOSABS 0.0 08/10/2011 1225   CARDIAC ENZYMES Lab Results  Component Value Date   CKTOTAL 63 07/06/2011   CKMB 1.5 07/06/2011   TROPONINI <0.30  07/06/2011    Assessment/Plan:  Patient Active Hospital Problem List: Acute lower GI bleeding (08/10/2011)   Assessment: Stable   Plan: Awaiting Meckel's Scan in AM Diverticulosis of colon with hemorrhage (08/10/2011)   Assessment: Stable   Plan: Stool softner per GI Acute posthemorrhagic anemia (08/10/2011)   Assessment: Stable post blood transfusion Anxiety DM, II    LOS: 4 days    Orpah Cobb  MD  08/14/2011, 5:25 PM

## 2011-08-15 ENCOUNTER — Inpatient Hospital Stay (HOSPITAL_COMMUNITY): Payer: Medicare Other

## 2011-08-15 ENCOUNTER — Encounter: Payer: Self-pay | Admitting: Internal Medicine

## 2011-08-15 LAB — GLUCOSE, CAPILLARY
Glucose-Capillary: 131 mg/dL — ABNORMAL HIGH (ref 70–99)
Glucose-Capillary: 120 mg/dL — ABNORMAL HIGH (ref 70–99)
Glucose-Capillary: 133 mg/dL — ABNORMAL HIGH (ref 70–99)
Glucose-Capillary: 128 mg/dL — ABNORMAL HIGH (ref 70–99)

## 2011-08-15 MED ORDER — SODIUM PERTECHNETATE TC 99M INJECTION
10.0000 | Freq: Once | INTRAVENOUS | Status: AC | PRN
  Administered 2011-08-15: 11.1 via INTRAVENOUS

## 2011-08-15 NOTE — Progress Notes (Signed)
[facility] Progress Note  SUBJECTIVE: worried and that is causing her some epigastric burning  OBJECTIVE:  Vital signs in last 24 hours: Temp:  [98.3 F (36.8 C)-98.6 F (37 C)] 98.3 F (36.8 C) (04/11 0514) Pulse Rate:  [51-55] 55  (04/11 0514) Resp:  [18-20] 18  (04/11 0514) BP: (104-136)/(64-75) 136/75 mmHg (04/11 0514) SpO2:  [96 %-98 %] 96 % (04/11 0514) Last BM Date: 08/13/11 General:    Pleasant black female in NAD Heart:  Regular rate and rhythm Lungs: Respirations even and unlabored, lungs CTA bilaterally Abdomen:  Soft, nontender and nondistended. Normal bowel sounds. Extremities:  Without edema. Neurologic:  Alert and oriented,  grossly normal neurologically. Psych:  Cooperative. Normal mood and affect.  Lab Results:  Basename 08/14/11 0854 08/13/11 1105 08/12/11 2258  WBC 6.4 -- --  HGB 11.4* 11.1* 10.8*  HCT 33.4* 32.8* 31.4*  PLT 176 -- --   BMET  Basename 08/14/11 0854 08/13/11 0515  NA 143 140  K 4.1 3.7  CL 107 107  CO2 25 24  GLUCOSE 220* 92  BUN 6 3*  CREATININE 0.59 0.54  CALCIUM 9.4 8.5     ASSESSMENT / PLAN:  1. Painless hematochezia, ?source of bleeding. No bleeding or BMs since bowel prep. EGD and small bowel video capsule unrevealing .  Colonoscopy pertinent for diverticulosis. For further evaluation she is for Meckels scan this am.  2. History of CVA, on chronic Plavix which we recommend be held for at least 7 days after discharge  3. IBS with alternating constipation and diarrhea.     LOS: 5 days   Willette Cluster  08/15/2011, 9:05 AM

## 2011-08-15 NOTE — Progress Notes (Signed)
I have taken an interval history, reviewed the chart and examined the patient. I agree with the extender's note, impression and recommendations. Presumed diverticular bleeding with no rebleeding. If Meckel scan is negative the GI work up is complete and she is OK for discharge from our standpoint. Outpatient GI follow up with Dr. Stan Head in 3-4 weeks.  Venita Lick. Russella Dar MD Clementeen Graham

## 2011-08-16 ENCOUNTER — Encounter: Payer: Self-pay | Admitting: Gastroenterology

## 2011-08-16 LAB — GLUCOSE, CAPILLARY: Glucose-Capillary: 112 mg/dL — ABNORMAL HIGH (ref 70–99)

## 2011-08-16 NOTE — Discharge Instructions (Signed)
Diverticulosis Diverticulosis is a common condition that develops when small pouches (diverticula) form in the wall of the colon. The risk of diverticulosis increases with age. It happens more often in people who eat a low-fiber diet. Most individuals with diverticulosis have no symptoms. Those individuals with symptoms usually experience abdominal pain, constipation, or diarrhea.  Eating a high-fiber diet has been shown to reduce discomfort and other symptoms of diverticulosis. Fiber may also slow the progression of diverticulosis. Foods having high fiber content include:  Baked beans, kidney beans, split peas, lentils.   Bran cereals, shredded wheat, bran muffins, whole-grain breads.   Fresh fruit, raisins, prunes.   Potatoes (with skin), broccoli, spinach, zucchini.  You may feel a little bloated when you introduce more fiber into your diet. These symptoms usually pass in time. Drink enough water and fluids to keep your urine clear or pale yellow, to prevent constipation. Try not to strain when you have a bowel movement. Some caregivers may recommend avoiding nuts and seeds to prevent complications of diverticulosis. Mild pain medicine may help soothe pain and spasms. Only take over-the-counter or prescription medicines for pain, discomfort, or fever as directed by your caregiver. Some complications of diverticulosis include an infection of the diverticula (diverticulitis), rectal bleeding, or blockage of the colon (bowel obstruction). SEEK IMMEDIATE MEDICAL CARE IF:   You develop increasing pain or severe bloating.   You have an oral temperature above 102 F (38.9 C), not controlled by medicine.   You develop vomiting or bowel movements that are bloody or black.  Document Released: 04/22/2005 Document Revised: 04/11/2011 Document Reviewed: 09/20/2009 [employer]CarAnemia, Frequently Asked Questions WHAT ARE THE SYMPTOMS OF ANEMIA?  Headache.   Difficulty thinking.   Fatigue.   Shortness  of breath.   Weakness.   Rapid heartbeat.  AT WHAT POINT ARE PEOPLE CONSIDERED ANEMIC?  This varies with gender and age.   Both hemoglobin (Hgb) and hematocrit values are used to define anemia. These lab values are obtained from a complete blood count (CBC) test. This is performed at a caregiver's office.   The normal range of hemoglobin values for adult men is 14.0 g/dL to 16.1 g/dL. For nonpregnant women, values are 12.3 g/dL to 09.6 g/dL.   The World Health Organization defines anemia as less than 12 g/dL for nonpregnant women and less than 13 g/dL for men.   For adult males, the average normal hematocrit is 46%, and the range is 40% to 52%.   For adult females, the average normal hematocrit is 41%, and the range is 35% to 47%.   Values that fall below the lower limits can be a sign of anemia and should have further checking (evaluation).  GROUPS OF PEOPLE WHO ARE AT RISK FOR DEVELOPING ANEMIA INCLUDE:   Infants who are breastfed or taking a formula that is not fortified with iron.   Children going through a rapid growth spurt. The iron available can not keep up with the needs for a red cell mass which must grow with the child.   Women in childbearing years. They need iron because of blood loss during menstruation.   Pregnant women. The growing fetus creates a high demand for iron.   People with ongoing gastrointestinal blood loss are at risk of developing iron deficiency.   Individuals with leukemia or cancer who must receive chemotherapy or radiation to treat their disease. The drugs or radiation used to treat these diseases often decreases the bone marrow's ability to make cells of all classes.  This includes red blood cells, white blood cells, and platelets.   Individuals with chronic inflammatory conditions such as rheumatoid arthritis or chronic infections.   The elderly.  ARE SOME TYPES OF ANEMIA INHERITED?   Yes, some types of anemia are due to inherited or genetic  defects.   Sickle cell anemia. This occurs most often in people of African, African American, and Mediterranean descent.   Thalassemia (or Cooley's anemia). This type is found in people of Mediterranean and Southeast Asian descent. These types of anemia are common.   Fanconi. This is rare.  CAN CERTAIN MEDICATIONS CAUSE A PERSON TO BECOME ANEMIC?  Yes. For example, drugs to fight cancer (chemotherapeutic agents) often cause anemia. These drugs can slow the bone marrow's ability to make red blood cells. If there are not enough red blood cells, the body does not get enough oxygen. WHAT HEMATOCRIT LEVEL IS REQUIRED TO DONATE BLOOD?  The lower limit of an acceptable hematocrit for blood donors is 38%. If you have a low hematocrit value, you should schedule an appointment with your caregiver. ARE BLOOD TRANSFUSIONS COMMONLY USED TO CORRECT ANEMIA, AND ARE THEY DANGEROUS?  They are used to treat anemia as a last resort. Your caregiver will find the cause of the anemia and correct it if possible. Most blood transfusions are given because of excessive bleeding at the time of surgery, with trauma, or because of bone marrow suppression in patients with cancer or leukemia on chemotherapy. Blood transfusions are safer than ever before. We also know that blood transfusions affect the immune system and may increase certain risks. There is also a concern for human error. In 1/16,000 transfusions, a patient receives a transfusion of blood that is not matched with his or her blood type.  WHAT IS IRON DEFICIENCY ANEMIA AND CAN I CORRECT IT BY CHANGING MY DIET?  Iron is an essential part of hemoglobin. Without enough hemoglobin, anemia develops and the body does not get the right amount of oxygen. Iron deficiency anemia develops after the body has had a low level of iron for a long time. This is either caused by blood loss, not taking in or absorbing enough iron, or increased demands for iron (like pregnancy or rapid  growth).  Foods from animal origin such as beef, chicken, and pork, are good sources of iron. Be sure to have one of these foods at each meal. Vitamin C helps your body absorb iron. Foods rich in Vitamin C include citrus, bell pepper, strawberries, spinach and cantaloupe. In some cases, iron supplements may be needed in order to correct the iron deficiency. In the case of poor absorption, extra iron may have to be given directly into the vein through a needle (intravenously). I HAVE BEEN DIAGNOSED WITH IRON DEFICIENCY ANEMIA AND MY CAREGIVER PRESCRIBED IRON SUPPLEMENTS. HOW LONG WILL IT TAKE FOR MY BLOOD TO BECOME NORMAL?  It depends on the degree of anemia at the beginning of treatment. Most people with mild to moderate iron deficiency, anemia will correct the anemia over a period of 2 to 3 months. But after the anemia is corrected, the iron stored by the body is still low. Caregivers often suggest an additional 6 months of oral iron therapy once the anemia has been reversed. This will help prevent the iron deficiency anemia from quickly happening again. Non-anemic adult males should take iron supplements only under the direction of a doctor, too much iron can cause liver damage.  MY HEMOGLOBIN IS 9 G/DL AND I AM  SCHEDULED FOR SURGERY. SHOULD I POSTPONE THE SURGERY?  If you have Hgb of 9, you should discuss this with your caregiver right away. Many patients with similar hemoglobin levels have had surgery without problems. If minimal blood loss is expected for a minor procedure, no treatment may be necessary.  If a greater blood loss is expected for more extensive procedures, you should ask your caregiver about being treated with erythropoietin and iron. This is to accelerate the recovery of your hemoglobin to a normal level before surgery. An anemic patient who undergoes high-blood-loss surgery has a greater risk of surgical complications and need for a blood transfusion, which also carries some risk.  I  HAVE BEEN TOLD THAT HEAVY MENSTRUAL PERIODS CAUSE ANEMIA. IS THERE ANYTHING I CAN DO TO PREVENT THE ANEMIA?  Anemia that results from heavy periods is usually due to iron deficiency. You can try to meet the increased demands for iron caused by the heavy monthly blood loss by increasing the intake of iron-rich foods. Iron supplements may be required. Discuss your concerns with your caregiver. WHAT CAUSES ANEMIA DURING PREGNANCY?  Pregnancy places major demands on the body. The mother must meet the needs of both her body and her growing baby. The body needs enough iron and folate to make the right amount of red blood cells. To prevent anemia while pregnant, the mother should stay in close contact with her caregiver.  Be sure to eat a diet that has foods rich in iron and folate like liver and dark green leafy vegetables. Folate plays an important role in the normal development of a baby's spinal cord. Folate can help prevent serious disorders like spina bifida. If your diet does not provide adequate nutrients, you may want to talk with your caregiver about nutritional supplements.  WHAT IS THE RELATIONSHIP BETWEEN FIBROID TUMORS AND ANEMIA IN WOMEN?  The relationship is usually caused by the increased menstrual blood loss caused by fibroids. Good iron intake may be required to prevent iron deficiency anemia from developing.  Document Released: 11/29/2003 Document Revised: 04/11/2011 Document Reviewed: 05/15/2010 [employer] Patient Information 2012 [employer] Patient Information 2012 [place], [place].Anemia, Frequently Asked Questions WHAT ARE THE SYMPTOMS OF ANEMIA?  Headache.   Difficulty thinking.   Fatigue.   Shortness of breath.   Weakness.   Rapid heartbeat.  AT WHAT POINT ARE PEOPLE CONSIDERED ANEMIC?  This varies with gender and age.   Both hemoglobin (Hgb) and hematocrit values are used to define anemia. These lab values are obtained from a complete blood count (CBC) test. This is  performed at a caregiver's office.   The normal range of hemoglobin values for adult men is 14.0 g/dL to 16.1 g/dL. For nonpregnant women, values are 12.3 g/dL to 09.6 g/dL.   The World Health Organization defines anemia as less than 12 g/dL for nonpregnant women and less than 13 g/dL for men.   For adult males, the average normal hematocrit is 46%, and the range is 40% to 52%.   For adult females, the average normal hematocrit is 41%, and the range is 35% to 47%.   Values that fall below the lower limits can be a sign of anemia and should have further checking (evaluation).  GROUPS OF PEOPLE WHO ARE AT RISK FOR DEVELOPING ANEMIA INCLUDE:   Infants who are breastfed or taking a formula that is not fortified with iron.   Children going through a rapid growth spurt. The iron available can not keep up with the needs for  a red cell mass which must grow with the child.   Women in childbearing years. They need iron because of blood loss during menstruation.   Pregnant women. The growing fetus creates a high demand for iron.   People with ongoing gastrointestinal blood loss are at risk of developing iron deficiency.   Individuals with leukemia or cancer who must receive chemotherapy or radiation to treat their disease. The drugs or radiation used to treat these diseases often decreases the bone marrow's ability to make cells of all classes. This includes red blood cells, white blood cells, and platelets.   Individuals with chronic inflammatory conditions such as rheumatoid arthritis or chronic infections.   The elderly.  ARE SOME TYPES OF ANEMIA INHERITED?   Yes, some types of anemia are due to inherited or genetic defects.   Sickle cell anemia. This occurs most often in people of African, African American, and Mediterranean descent.   Thalassemia (or Cooley's anemia). This type is found in people of Mediterranean and Southeast Asian descent. These types of anemia are common.   Fanconi.  This is rare.  CAN CERTAIN MEDICATIONS CAUSE A PERSON TO BECOME ANEMIC?  Yes. For example, drugs to fight cancer (chemotherapeutic agents) often cause anemia. These drugs can slow the bone marrow's ability to make red blood cells. If there are not enough red blood cells, the body does not get enough oxygen. WHAT HEMATOCRIT LEVEL IS REQUIRED TO DONATE BLOOD?  The lower limit of an acceptable hematocrit for blood donors is 38%. If you have a low hematocrit value, you should schedule an appointment with your caregiver. ARE BLOOD TRANSFUSIONS COMMONLY USED TO CORRECT ANEMIA, AND ARE THEY DANGEROUS?  They are used to treat anemia as a last resort. Your caregiver will find the cause of the anemia and correct it if possible. Most blood transfusions are given because of excessive bleeding at the time of surgery, with trauma, or because of bone marrow suppression in patients with cancer or leukemia on chemotherapy. Blood transfusions are safer than ever before. We also know that blood transfusions affect the immune system and may increase certain risks. There is also a concern for human error. In 1/16,000 transfusions, a patient receives a transfusion of blood that is not matched with his or her blood type.  WHAT IS IRON DEFICIENCY ANEMIA AND CAN I CORRECT IT BY CHANGING MY DIET?  Iron is an essential part of hemoglobin. Without enough hemoglobin, anemia develops and the body does not get the right amount of oxygen. Iron deficiency anemia develops after the body has had a low level of iron for a long time. This is either caused by blood loss, not taking in or absorbing enough iron, or increased demands for iron (like pregnancy or rapid growth).  Foods from animal origin such as beef, chicken, and pork, are good sources of iron. Be sure to have one of these foods at each meal. Vitamin C helps your body absorb iron. Foods rich in Vitamin C include citrus, bell pepper, strawberries, spinach and cantaloupe. In some  cases, iron supplements may be needed in order to correct the iron deficiency. In the case of poor absorption, extra iron may have to be given directly into the vein through a needle (intravenously). I HAVE BEEN DIAGNOSED WITH IRON DEFICIENCY ANEMIA AND MY CAREGIVER PRESCRIBED IRON SUPPLEMENTS. HOW LONG WILL IT TAKE FOR MY BLOOD TO BECOME NORMAL?  It depends on the degree of anemia at the beginning of treatment. Most people with mild  to moderate iron deficiency, anemia will correct the anemia over a period of 2 to 3 months. But after the anemia is corrected, the iron stored by the body is still low. Caregivers often suggest an additional 6 months of oral iron therapy once the anemia has been reversed. This will help prevent the iron deficiency anemia from quickly happening again. Non-anemic adult males should take iron supplements only under the direction of a doctor, too much iron can cause liver damage.  MY HEMOGLOBIN IS 9 G/DL AND I AM SCHEDULED FOR SURGERY. SHOULD I POSTPONE THE SURGERY?  If you have Hgb of 9, you should discuss this with your caregiver right away. Many patients with similar hemoglobin levels have had surgery without problems. If minimal blood loss is expected for a minor procedure, no treatment may be necessary.  If a greater blood loss is expected for more extensive procedures, you should ask your caregiver about being treated with erythropoietin and iron. This is to accelerate the recovery of your hemoglobin to a normal level before surgery. An anemic patient who undergoes high-blood-loss surgery has a greater risk of surgical complications and need for a blood transfusion, which also carries some risk.  I HAVE BEEN TOLD THAT HEAVY MENSTRUAL PERIODS CAUSE ANEMIA. IS THERE ANYTHING I CAN DO TO PREVENT THE ANEMIA?  Anemia that results from heavy periods is usually due to iron deficiency. You can try to meet the increased demands for iron caused by the heavy monthly blood loss by  increasing the intake of iron-rich foods. Iron supplements may be required. Discuss your concerns with your caregiver. WHAT CAUSES ANEMIA DURING PREGNANCY?  Pregnancy places major demands on the body. The mother must meet the needs of both her body and her growing baby. The body needs enough iron and folate to make the right amount of red blood cells. To prevent anemia while pregnant, the mother should stay in close contact with her caregiver.  Be sure to eat a diet that has foods rich in iron and folate like liver and dark green leafy vegetables. Folate plays an important role in the normal development of a baby's spinal cord. Folate can help prevent serious disorders like spina bifida. If your diet does not provide adequate nutrients, you may want to talk with your caregiver about nutritional supplements.  WHAT IS THE RELATIONSHIP BETWEEN FIBROID TUMORS AND ANEMIA IN WOMEN?  The relationship is usually caused by the increased menstrual blood loss caused by fibroids. Good iron intake may be required to prevent iron deficiency anemia from developing.  Document Released: 11/29/2003 Document Revised: 04/11/2011 Document Reviewed: 05/15/2010 [facility] Patient Information 2012 [place], [place].

## 2011-08-16 NOTE — Discharge Summary (Signed)
Physician Discharge Summary  Patient ID: [name] MRN: [id number] DOB/AGE: Jul 05, 1955 56 y.o.  Admit date: 08/10/2011 Discharge date: 08/16/2011  Admission Diagnoses: Acute lower GI bleeding* Principle diagnosis  Diverticulosis of colon with hemorrhage  Acute posthemorrhagic anemia DM, II Anxiety  Discharge Diagnoses:  Active Problems:  Acute lower GI bleeding* Principle diagnosis  Diverticulosis of colon with hemorrhage  Acute posthemorrhagic anemia DM, II Anxiety  Discharged Condition: good  Hospital Course: 56 years old black female with recurrent bright red blood per rectum. Extensive GI workup including capsule endoscopy and Meckel's Scan were unremarkable. She received 2 units of B + blood. She had no further Rectal bleed. Her Aspirin, Plavix and Ibuprofen were discontinued. She was discharged home on 08/16/2011 in stable condition with follow up by me in 1 week and by C. Gessner in 1 month.  Consults: GI  Significant Diagnostic Studies: labs: Low Hgb of 9.6, post transfusion stabilized at 11.4, normal BMET.  No evidence of active lower GI bleed or vascular abnormalities. Extensive interrogation of superior and inferior mesenteric artery supply was performed including relook of the superior mesenteric artery and post nitroglycerin runs to promote vasodilatation/ Original Report Authenticated By: Reola Calkins, M.D.  Nuclear medicine: No scintigraphic evidence of Meckel's diverticulum./ Original Report Authenticated By: Genevive Bi, M.D.   Colonoscopy showed diverticulosis and upper endoscopy showed Schatzki's ring and prior fundoplication/Dr. Karolee Ohs.  Treatments: IV hydration, blood transfusion, discontinuing ASA, Plavix and Ibuprofen. Underwent procedures: Upper and lower endoscopy and Superior and inferior Mesenteric artery angiography, capsule endoscopy and Meckel's Scan.  Discharge Exam: Blood pressure 120/56, pulse 58, temperature 98.3 F (36.8 C),  temperature source Oral, resp. rate 16, height 5\' 4"  (1.626 m), weight 67.9 kg (149 lb 11.1 oz), SpO2 96.00%. HEENT: [place]/AT, Eyes-Brown, PERL, EOMI, Conjunctiva-Pink, Sclera-Non-icteric  Neck: No JVD, No bruit, Trachea midline.  Lungs: Clear, Bilateral.  Cardiac: Regular rhythm, normal S1 and S2, no S3.  Abdomen: Soft, No epigastric tenderness.  Extremities: No edema present. No cyanosis. No clubbing.  CNS: AxOx3, Cranial nerves grossly intact, moves all 4 extremities. Right handed.  Skin: Warm and dry.   Disposition: 01-Home or Self Care  Discharge Orders    Future Appointments: Provider: Department: Dept Phone: Center:   09/12/2011 2:00 PM Iva Boop, MD [facility] [phone] [facility]     Medication List  As of 08/16/2011 11:12 AM   STOP taking these medications         aspirin EC 81 MG tablet      clopidogrel 75 MG tablet      ibuprofen 800 MG tablet         TAKE these medications         ALPRAZolam 0.5 MG tablet   Commonly known as: XANAX   Take 0.25 mg by mouth at bedtime as needed. For anxiety      esomeprazole 40 MG capsule   Commonly known as: NEXIUM   Take 40 mg by mouth daily before breakfast.      glimepiride 4 MG tablet   Commonly known as: AMARYL   Take 4 mg by mouth 2 (two) times daily.      KLOR-CON M20 PO   Take 1 capsule by mouth daily.      mometasone 50 MCG/ACT nasal spray   Commonly known as: NASONEX   Place 1 spray into the nose daily as needed. For allergies      mupirocin cream 2 %   Commonly known as:  BACTROBAN   Apply 1 application topically daily.      nitroGLYCERIN 0.4 mg/hr   Commonly known as: NITRODUR - Dosed in mg/24 hr   Place 1 patch onto the skin daily.      nitroGLYCERIN 0.4 MG SL tablet   Commonly known as: NITROSTAT   Place 0.4 mg under the tongue every 5 (five) minutes as needed.      rosuvastatin 20 MG tablet   Commonly known as: CRESTOR   Take 20 mg by mouth daily.      saxagliptin HCl 5 MG Tabs  tablet   Commonly known as: ONGLYZA   Take 5 mg by mouth daily.      SUMAtriptan 50 MG tablet   Commonly known as: IMITREX   Take 50 mg by mouth daily as needed. For migraine  Up to 9 monthly      traMADol 50 MG tablet   Commonly known as: ULTRAM   Take 50 mg by mouth every 6 (six) hours as needed. FOR PAIN      TRIBENZOR 40-5-12.5 MG Tabs   Generic drug: Olmesartan-Amlodipine-HCTZ   Take 1 tablet by mouth daily.      zolpidem 10 MG tablet   Commonly known as: AMBIEN   Take 10 mg by mouth at bedtime as needed. FOR SLEEP           Follow-up Information    Follow up with Stan Head, MD on 09/12/2011. (at 2:00pm)    Contact information:   [address] [facility] [address] [place] [address] [place] [address] [phone]       Follow up with Ricki Rodriguez, MD. Schedule an appointment as soon as possible for a visit in 1 week.   Contact information:   [address] [place] [address] [phone]          Signed: Ricki Rodriguez 08/16/2011, 11:12 AM

## 2011-08-16 NOTE — Progress Notes (Signed)
   CARE MANAGEMENT NOTE 08/16/2011  Patient:  [name], [name]   Account Number:  [id number]  Date Initiated:  08/16/2011  Documentation initiated by:  GRAVES-BIGELOW,Ritik Stavola  Subjective/Objective Assessment:   Pt admitted with Bright red blood per rectum. Pt s/p blood transfusions s/p COLONOSCOPY.     Action/Plan:   Pt plan for d/c today. No needs for home.   Anticipated DC Date:  08/16/2011   Anticipated DC Plan:  HOME/SELF CARE      DC Planning Services  CM consult      Choice offered to / List presented to:             Status of service:  Completed, signed off Medicare Important Message given?   (If response is "NO", the following Medicare IM given date fields will be blank) Date Medicare IM given:   Date Additional Medicare IM given:    Discharge Disposition:  HOME/SELF CARE  Per UR Regulation:    If discussed at Long Length of Stay Meetings, dates discussed:    Comments:

## 2011-08-16 NOTE — Progress Notes (Signed)
Subjective:  Worried about BM and GI bleed. Awaiting Nuclear study/Meckel's Scan  Objective:  Vital Signs in the last 24 hours: Temp:  [98.2 F (36.8 C)-98.4 F (36.9 C)] 98.3 F (36.8 C) (04/12 0527) Pulse Rate:  [54-58] 58  (04/12 0527) Cardiac Rhythm:  [-] Normal sinus rhythm (04/11 2015) Resp:  [16-20] 16  (04/12 0527) BP: (120-128)/(56-75) 120/56 mmHg (04/12 0527) SpO2:  [96 %-97 %] 96 % (04/12 0527)  Physical Exam: BP Readings from Last 1 Encounters:  08/16/11 120/56    Wt Readings from Last 1 Encounters:  08/10/11 67.9 kg (149 lb 11.1 oz)    Weight change:   HEENT: [place]/AT, Eyes-Brown, PERL, EOMI, Conjunctiva-Pink, Sclera-Non-icteric Neck: No JVD, No bruit, Trachea midline. Lungs:  Clear, Bilateral. Cardiac:  Regular rhythm, normal S1 and S2, no S3.  Abdomen:  Soft,  Mild epigastric tenderness. Extremities:  No edema present. No cyanosis. No clubbing. CNS: AxOx3, Cranial nerves grossly intact, moves all 4 extremities. Right handed. Skin: Warm and dry.   Intake/Output from previous day: 04/11 0701 - 04/12 0700 In: 840 [P.O.:840] Out: -     Lab Results: BMET    Component Value Date/Time   NA 143 08/14/2011 0854   K 4.1 08/14/2011 0854   CL 107 08/14/2011 0854   CO2 25 08/14/2011 0854   GLUCOSE 220* 08/14/2011 0854   BUN 6 08/14/2011 0854   CREATININE 0.59 08/14/2011 0854   CALCIUM 9.4 08/14/2011 0854   GFRNONAA >90 08/14/2011 0854   GFRAA >90 08/14/2011 0854   CBC    Component Value Date/Time   WBC 6.4 08/14/2011 0854   RBC 4.03 08/14/2011 0854   HGB 11.4* 08/14/2011 0854   HCT 33.4* 08/14/2011 0854   PLT 176 08/14/2011 0854   MCV 82.9 08/14/2011 0854   MCH 28.3 08/14/2011 0854   MCHC 34.1 08/14/2011 0854   RDW 13.5 08/14/2011 0854   LYMPHSABS 2.7 08/10/2011 1225   MONOABS 0.5 08/10/2011 1225   EOSABS 0.1 08/10/2011 1225   BASOSABS 0.0 08/10/2011 1225   CARDIAC ENZYMES Lab Results  Component Value Date   CKTOTAL 63 07/06/2011   CKMB 1.5 07/06/2011   TROPONINI <0.30  07/06/2011    Assessment/Plan:  Patient Active Hospital Problem List: Acute lower GI bleeding (08/10/2011)   Assessment: Stable   Plan: Meckel's scan today Diverticulosis of colon with hemorrhage (08/10/2011)   Assessment: Stable   Plan: per GI. Acute posthemorrhagic anemia (08/10/2011)   Assessment: Stable   Plan: Plan per GI Anxiety DM, II    LOS: 5 days    Orpah Cobb  MD  08/16/2011, 11:07 AM

## 2011-09-12 ENCOUNTER — Other Ambulatory Visit (INDEPENDENT_AMBULATORY_CARE_PROVIDER_SITE_OTHER): Payer: Medicare Other

## 2011-09-12 ENCOUNTER — Ambulatory Visit (INDEPENDENT_AMBULATORY_CARE_PROVIDER_SITE_OTHER): Payer: Medicare Other | Admitting: Internal Medicine

## 2011-09-12 ENCOUNTER — Encounter: Payer: Self-pay | Admitting: Internal Medicine

## 2011-09-12 VITALS — BP 104/70 | HR 96 | Ht 63.5 in | Wt 139.1 lb

## 2011-09-12 DIAGNOSIS — R143 Flatulence: Secondary | ICD-10-CM

## 2011-09-12 DIAGNOSIS — K589 Irritable bowel syndrome without diarrhea: Secondary | ICD-10-CM

## 2011-09-12 DIAGNOSIS — D62 Acute posthemorrhagic anemia: Secondary | ICD-10-CM

## 2011-09-12 DIAGNOSIS — R140 Abdominal distension (gaseous): Secondary | ICD-10-CM

## 2011-09-12 DIAGNOSIS — K219 Gastro-esophageal reflux disease without esophagitis: Secondary | ICD-10-CM

## 2011-09-12 DIAGNOSIS — D649 Anemia, unspecified: Secondary | ICD-10-CM

## 2011-09-12 DIAGNOSIS — R141 Gas pain: Secondary | ICD-10-CM

## 2011-09-12 DIAGNOSIS — K5731 Diverticulosis of large intestine without perforation or abscess with bleeding: Secondary | ICD-10-CM

## 2011-09-12 DIAGNOSIS — R142 Eructation: Secondary | ICD-10-CM

## 2011-09-12 LAB — CBC WITH DIFFERENTIAL/PLATELET
Basophils Absolute: 0 10*3/uL (ref 0.0–0.1)
Basophils Relative: 0.4 % (ref 0.0–3.0)
Eosinophils Absolute: 0 10*3/uL (ref 0.0–0.7)
Eosinophils Relative: 0.5 % (ref 0.0–5.0)
HCT: 38.6 % (ref 36.0–46.0)
Hemoglobin: 12.6 g/dL (ref 12.0–15.0)
Lymphocytes Relative: 33.3 % (ref 12.0–46.0)
Lymphs Abs: 2.2 10*3/uL (ref 0.7–4.0)
MCHC: 32.8 g/dL (ref 30.0–36.0)
MCV: 86.2 fl (ref 78.0–100.0)
Monocytes Absolute: 0.4 10*3/uL (ref 0.1–1.0)
Monocytes Relative: 5.8 % (ref 3.0–12.0)
Neutro Abs: 4.1 10*3/uL (ref 1.4–7.7)
Neutrophils Relative %: 60 % (ref 43.0–77.0)
Platelets: 243 10*3/uL (ref 150.0–400.0)
RBC: 4.48 Mil/uL (ref 3.87–5.11)
RDW: 14.7 % — ABNORMAL HIGH (ref 11.5–14.6)
WBC: 6.8 10*3/uL (ref 4.5–10.5)

## 2011-09-12 LAB — IGA: IgA: 225 mg/dL (ref 68–378)

## 2011-09-12 MED ORDER — ESOMEPRAZOLE MAGNESIUM 40 MG PO CPDR: 40.0000 mg | DELAYED_RELEASE_CAPSULE | Freq: Every day | ORAL | Status: DC

## 2011-09-12 MED ORDER — DICYCLOMINE HCL 20 MG PO TABS: 20.0000 mg | ORAL_TABLET | Freq: Three times a day (TID) | ORAL | Status: DC

## 2011-09-12 MED ORDER — PROMETHAZINE HCL 25 MG PO TABS: 25.0000 mg | ORAL_TABLET | Freq: Four times a day (QID) | ORAL | Status: DC | PRN

## 2011-09-12 NOTE — Patient Instructions (Addendum)
Go to the basement for labs today, CBC, TTG, IGA. Take Nexium once a day, samples given. Dr Leone Payor has sent Phenergan, Bentyl to your pharmacy.   You have been given a high fiber diet handout and also a handout on diverticulitis.

## 2011-09-12 NOTE — Progress Notes (Signed)
  Subjective:    Patient ID: [name], female    DOB: [birth year], 56 y.o.   MRN: [id number]  HPI This is a 56 year old African American woman with a long history of IBS. I have not seen her since 2008. She was admitted to the hospital and my colleagues saw her in consultation for GI bleeding in the setting of Plavix. She had an upper endoscopy, colonoscopy as well as a capsule endoscopy of the small bowel. Diverticulosis of the colon was only abnormality seen that could have contributed to the bleeding so the conclusion was that she had diverticulosis with hemorrhage. It resolved. She has not seen any more bleeding.  She is complaining of a fairly constant nausea, postprandial bloating and epigastric pain. She has not taken her Nexium regularly lately though she has used some recently has been somewhat intermittent. Her bowel habits alternate, she'll skip a day or 2 and then move her bowels sometimes loose. This is a chronic stable pattern that is similar to what she's had in the past and says it's not really bothering her.  She's been somewhat weak and fatigued feel lightheaded. She says she's tired feeling sick all the time.  Medications, allergies, past medical history, past surgical history, family history and social history are reviewed and updated in the EMR.    Review of Systems As above in the history of present illness    Objective:   Physical Exam General:  NAD Eyes:   anicteric Lungs:  clear Heart:  S1S2 no rubs, murmurs or gallops Abdomen:  soft and mildly tender in epigastrium, BS+ Ext:   no edema    Data Reviewed:   Lab Results  Component Value Date   WBC 6.4 08/14/2011   HGB 11.4* 08/14/2011   HCT 33.4* 08/14/2011   MCV 82.9 08/14/2011   PLT 176 08/14/2011          Assessment & Plan:   1. Diverticulosis of colon with hemorrhage   2. Acute posthemorrhagic anemia   Bleeding appears resolved. Her hemoglobin was in the 11 range last. We'll recheck a  CBC. Think it is reasonable for her to resume aspirin and/or Plavix if deemed necessary by her cardiologist. I have explained this to her. I recommended she start at least a baby aspirin now, she sees Dr. Algie Coffer next week.   3. IBS (irritable bowel syndrome)   I think this and or functional dyspepsia is what her upper, pain is. She will restart Nexium on a daily basis, dicyclomine 20 mg up to 4 times a day as prescribed.   4. GERD (gastroesophageal reflux disease)   5. Bloating   probably IBS, will check celiac studies with TTG antibodies and IgA level.       CC: Ricki Rodriguez, MD, MD

## 2011-09-13 LAB — TISSUE TRANSGLUTAMINASE, IGA: Tissue Transglutaminase Ab, IgA: 4.2 U/mL (ref <20)

## 2011-09-14 ENCOUNTER — Telehealth: Payer: Self-pay | Admitting: Internal Medicine

## 2011-09-14 NOTE — Telephone Encounter (Signed)
TTG antibody was negative so she does not appear to have celiac disease.  My staff will call and explain.  This further supports diagnosis of IBS.  Stick with plan at last office visit.

## 2011-09-16 NOTE — Telephone Encounter (Signed)
Pt informed of lab results per Dr. Marvell Fuller message.  She had no questions.

## 2011-10-25 ENCOUNTER — Emergency Department (HOSPITAL_COMMUNITY)
Admission: EM | Admit: 2011-10-25 | Discharge: 2011-10-25 | Disposition: A | Discharge status: Home / Self Care | Payer: Medicare Other | Attending: Emergency Medicine | Admitting: Emergency Medicine

## 2011-10-25 ENCOUNTER — Encounter (HOSPITAL_COMMUNITY): Payer: Self-pay | Admitting: *Deleted

## 2011-10-25 DIAGNOSIS — R51 Headache: Secondary | ICD-10-CM | POA: Insufficient documentation

## 2011-10-25 DIAGNOSIS — E785 Hyperlipidemia, unspecified: Secondary | ICD-10-CM | POA: Insufficient documentation

## 2011-10-25 DIAGNOSIS — L02219 Cutaneous abscess of trunk, unspecified: Secondary | ICD-10-CM | POA: Insufficient documentation

## 2011-10-25 DIAGNOSIS — L02214 Cutaneous abscess of groin: Secondary | ICD-10-CM

## 2011-10-25 DIAGNOSIS — I252 Old myocardial infarction: Secondary | ICD-10-CM | POA: Insufficient documentation

## 2011-10-25 DIAGNOSIS — Z8673 Personal history of transient ischemic attack (TIA), and cerebral infarction without residual deficits: Secondary | ICD-10-CM | POA: Insufficient documentation

## 2011-10-25 DIAGNOSIS — F411 Generalized anxiety disorder: Secondary | ICD-10-CM | POA: Insufficient documentation

## 2011-10-25 DIAGNOSIS — K219 Gastro-esophageal reflux disease without esophagitis: Secondary | ICD-10-CM | POA: Insufficient documentation

## 2011-10-25 DIAGNOSIS — Z79899 Other long term (current) drug therapy: Secondary | ICD-10-CM | POA: Insufficient documentation

## 2011-10-25 DIAGNOSIS — E119 Type 2 diabetes mellitus without complications: Secondary | ICD-10-CM | POA: Insufficient documentation

## 2011-10-25 DIAGNOSIS — I10 Essential (primary) hypertension: Secondary | ICD-10-CM | POA: Insufficient documentation

## 2011-10-25 DIAGNOSIS — F17200 Nicotine dependence, unspecified, uncomplicated: Secondary | ICD-10-CM | POA: Insufficient documentation

## 2011-10-25 MED ORDER — ONDANSETRON 4 MG PO TBDP
4.0000 mg | ORAL_TABLET | Freq: Once | ORAL | Status: AC
  Administered 2011-10-25: 4 mg via ORAL
  Filled 2011-10-25: qty 1

## 2011-10-25 MED ORDER — OXYCODONE-ACETAMINOPHEN 5-325 MG PO TABS
1.0000 | ORAL_TABLET | Freq: Once | ORAL | Status: AC
  Administered 2011-10-25: 1 via ORAL
  Filled 2011-10-25: qty 1

## 2011-10-25 MED ORDER — DOXYCYCLINE HYCLATE 100 MG PO CAPS: 100.0000 mg | ORAL_CAPSULE | Freq: Two times a day (BID) | ORAL | Status: AC

## 2011-10-25 MED ORDER — LIDOCAINE-EPINEPHRINE-TETRACAINE (LET) SOLUTION
3.0000 mL | Freq: Once | NASAL | Status: AC
  Administered 2011-10-25: 3 mL via TOPICAL
  Filled 2011-10-25: qty 3

## 2011-10-25 MED ORDER — DOXYCYCLINE HYCLATE 100 MG PO TABS
100.0000 mg | ORAL_TABLET | Freq: Once | ORAL | Status: AC
  Administered 2011-10-25: 100 mg via ORAL
  Filled 2011-10-25: qty 1

## 2011-10-25 MED ORDER — OXYCODONE-ACETAMINOPHEN 5-325 MG PO TABS: 1.0000 | ORAL_TABLET | Freq: Four times a day (QID) | ORAL | Status: AC | PRN

## 2011-10-25 MED ORDER — LIDOCAINE HCL (PF) 1 % IJ SOLN
INTRAMUSCULAR | Status: AC
  Administered 2011-10-25: 22:00:00
  Filled 2011-10-25: qty 5

## 2011-10-25 NOTE — ED Notes (Signed)
Abscess to lt thigh, present  For 2 days

## 2011-10-25 NOTE — ED Notes (Addendum)
Pt w/ history of abscesses, states current one has been here for 2 to 3 days. Abscess about 3cm just above the left labia

## 2011-10-25 NOTE — Discharge Instructions (Signed)
Please remove the packing on Monday, June 24. Please use doxycycline 2 times daily for  7-10 days. Please see the surgeon for additional evaluation of your recurrent abscess problem. After the packing is removed you may resume your warm salt water tub soaks daily.Abscess An abscess (boil or furuncle) is an infected area that contains a collection of pus.  SYMPTOMS Signs and symptoms of an abscess include pain, tenderness, redness, or hardness. You may feel a moveable soft area under your skin. An abscess can occur anywhere in the body.  TREATMENT  A surgical cut (incision) may be made over your abscess to drain the pus. Gauze may be packed into the space or a drain may be looped through the abscess cavity (pocket). This provides a drain that will allow the cavity to heal from the inside outwards. The abscess may be painful for a few days, but should feel much better if it was drained.  Your abscess, if seen early, may not have localized and may not have been drained. If not, another appointment may be required if it does not get better on its own or with medications. HOME CARE INSTRUCTIONS   Only take over-the-counter or prescription medicines for pain, discomfort, or fever as directed by your caregiver.   Take your antibiotics as directed if they were prescribed. Finish them even if you start to feel better.   Keep the skin and clothes clean around your abscess.   If the abscess was drained, you will need to use gauze dressing to collect any draining pus. Dressings will typically need to be changed 3 or more times a day.   The infection may spread by skin contact with others. Avoid skin contact as much as possible.   Practice good hygiene. This includes regular hand washing, cover any draining skin lesions, and do not share personal care items.   If you participate in sports, do not share athletic equipment, towels, whirlpools, or personal care items. Shower after every practice or tournament.     If a draining area cannot be adequately covered:   Do not participate in sports.   Children should not participate in day care until the wound has healed or drainage stops.   If your caregiver has given you a follow-up appointment, it is very important to keep that appointment. Not keeping the appointment could result in a much worse infection, chronic or permanent injury, pain, and disability. If there is any problem keeping the appointment, you must call back to this facility for assistance.  SEEK MEDICAL CARE IF:   You develop increased pain, swelling, redness, drainage, or bleeding in the wound site.   You develop signs of generalized infection including muscle aches, chills, fever, or a general ill feeling.   You have an oral temperature above 102 F (38.9 C).  MAKE SURE YOU:   Understand these instructions.   Will watch your condition.   Will get help right away if you are not doing well or get worse.  Document Released: 01/30/2005 Document Revised: 04/11/2011 Document Reviewed: 11/24/2007 [facility] Patient Information 2012 [place], [place].

## 2011-10-29 LAB — CULTURE, ROUTINE-ABSCESS: Culture: NO GROWTH

## 2012-01-22 ENCOUNTER — Other Ambulatory Visit (HOSPITAL_COMMUNITY): Payer: Self-pay | Admitting: Cardiovascular Disease

## 2012-01-22 DIAGNOSIS — Z139 Encounter for screening, unspecified: Secondary | ICD-10-CM

## 2012-01-24 ENCOUNTER — Encounter (INDEPENDENT_AMBULATORY_CARE_PROVIDER_SITE_OTHER): Payer: Self-pay | Admitting: Surgery

## 2012-01-24 ENCOUNTER — Ambulatory Visit (INDEPENDENT_AMBULATORY_CARE_PROVIDER_SITE_OTHER): Payer: Medicare Other | Admitting: Surgery

## 2012-01-24 ENCOUNTER — Encounter (INDEPENDENT_AMBULATORY_CARE_PROVIDER_SITE_OTHER): Payer: Self-pay | Admitting: General Surgery

## 2012-01-24 VITALS — BP 142/100 | HR 104 | Temp 97.8°F | Ht 64.0 in | Wt 139.6 lb

## 2012-01-24 DIAGNOSIS — L732 Hidradenitis suppurativa: Secondary | ICD-10-CM

## 2012-01-24 MED ORDER — FLUCONAZOLE 200 MG PO TABS: 200.0000 mg | ORAL_TABLET | Freq: Every day | ORAL | Status: DC

## 2012-01-24 MED ORDER — DOXYCYCLINE HYCLATE 100 MG PO TABS: 100.0000 mg | ORAL_TABLET | Freq: Two times a day (BID) | ORAL | Status: DC

## 2012-01-24 MED ORDER — HYDROCODONE-ACETAMINOPHEN 7.5-500 MG PO TABS: 1.0000 | ORAL_TABLET | Freq: Four times a day (QID) | ORAL | Status: DC | PRN

## 2012-01-24 NOTE — Patient Instructions (Addendum)
Warm Sitz baths  Use hair dryer to dry--on low heat Take antibiotics for 10 days  Hidradenitis Suppurativa, Sweat Gland Abscess Hidradenitis suppurativa is a long lasting (chronic), uncommon disease of the sweat glands. With this, boil-like lumps and scarring develop in the groin, some times under the arms (axillae), and under the breasts. It may also uncommonly occur behind the ears, in the crease of the buttocks, and around the genitals.  CAUSES  The cause is from a blocking of the sweat glands. They then become infected. It may cause drainage and odor. It is not contagious. So it cannot be given to someone else. It most often shows up in puberty (about 8 to 57 years of age). But it may happen much later. It is similar to acne which is a disease of the sweat glands. This condition is slightly more common in African-Americans and women. SYMPTOMS   Hidradenitis usually starts as one or more red, tender, swellings in the groin or under the arms (axilla).   Over a period of hours to days the lesions get larger. They often open to the skin surface, draining clear to yellow-colored fluid.   The infected area heals with scarring.  DIAGNOSIS  Your caregiver makes this diagnosis by looking at you. Sometimes cultures (growing germs on plates in the lab) may be taken. This is to see what germ (bacterium) is causing the infection.  TREATMENT   Topical germ killing medicine applied to the skin (antibiotics) are the treatment of choice. Antibiotics taken by mouth (systemic) are sometimes needed when the condition is getting worse or is severe.   Avoid tight-fitting clothing which traps moisture in.   Dirt does not cause hidradenitis and it is not caused by poor hygiene.   Involved areas should be cleaned daily using an antibacterial soap. Some patients find that the liquid form of Lever 2000, applied to the involved areas as a lotion after bathing, can help reduce the odor related to this condition.     Sometimes surgery is needed to drain infected areas or remove scarred tissue. Removal of large amounts of tissue is used only in severe cases.   Birth control pills may be helpful.   Oral retinoids (vitamin A derivatives) for 6 to 12 months which are effective for acne may also help this condition.   Weight loss will improve but not cure hidradenitis. It is made worse by being overweight. But the condition is not caused by being overweight.   This condition is more common in people who have had acne.   It may become worse under stress.  There is no medical cure for hidradenitis. It can be controlled, but not cured. The condition usually continues for years with periods of getting worse and getting better (remission). Document Released: 12/05/2003 Document Revised: 04/11/2011 Document Reviewed: 12/21/2007 [facility] Patient Information 2012 [place], [place].

## 2012-01-24 NOTE — Progress Notes (Signed)
URGENT Office Christain Sacramento 56 y.o.  Body mass index is 23.96 kg/(m^2).  Patient Active Problem List  Diagnosis  . DIABETES MELLITUS  . DYSLIPIDEMIA  . HYPERTENSION  . Hidradenitis, left pubic area and left lower quadrant  . Chest pain at rest  . Diverticulosis of colon with hemorrhage  . Acute posthemorrhagic anemia  . IBS (irritable bowel syndrome)  . GERD (gastroesophageal reflux disease)    Allergies  Allergen Reactions  . Iodine Swelling  . Metformin And Related Diarrhea  . Shellfish Allergy   . Adhesive (Tape) Rash    Past Surgical History  Procedure Date  . Hiatal hernia repair   . Breast surgery 2007, 2008    right cyst removed  . Breast reduction surgery   . Axillary hidradenitis excision 1990-2008    bilateral  . Excision hidradenitis groin and abdomen 2012  . Back surgery   . Cervical disc surgery   . Colonoscopy 08/12/2011    Procedure: COLONOSCOPY;  Surgeon: Meryl Dare, MD,FACG;  Location[facility] ENDOSCOPY;  Service: Endoscopy;  Laterality: N/A;  . Esophagogastroduodenoscopy 08/12/2011    Procedure: ESOPHAGOGASTRODUODENOSCOPY (EGD);  Surgeon: Meryl Dare, MD,FACG;  Location: [facility] ENDOSCOPY;  Service: Endoscopy;  Laterality: N/A;  . Givens capsule study 08/13/2011    Procedure: GIVENS CAPSULE STUDY;  Surgeon: Meryl Dare, MD,FACG;  Location: [facility] ENDOSCOPY;  Service: Endoscopy;  Laterality: N/A;  . Nissen fundoplication 1996  . Abdominal hysterectomy 1985  . Nasal sinus surgery   . Cholecystectomy   . Foot surgery     bi-lateral  . Colonoscopy 06/19/2006  . Esophagogastroduodenoscopy 06/04/2005   KADAKIA,AJAY S, MD 1. Hidradenitis, left pubic area and left lower quadrant   2. Vulval Hidradenitis Suppurativa     Seen in urgent office with recurrent left vulvovaginal hidradenitis.  The area was infiltrated with lidocaine and neut and I & D.  Tolerated well.  Will see back in 4 weeks and try to discuss excision of this scarred area. Matt B. Daphine Deutscher, MD,  [facility] [phone] beeper [phone]  01/24/2012 3:56 PM

## 2012-02-03 ENCOUNTER — Ambulatory Visit (HOSPITAL_COMMUNITY)
Admission: RE | Admit: 2012-02-03 | Discharge: 2012-02-03 | Disposition: A | Discharge status: Home / Self Care | Payer: Medicare Other | Source: Ambulatory Visit | Attending: Cardiovascular Disease | Admitting: Cardiovascular Disease

## 2012-02-03 DIAGNOSIS — Z1231 Encounter for screening mammogram for malignant neoplasm of breast: Secondary | ICD-10-CM | POA: Insufficient documentation

## 2012-02-03 DIAGNOSIS — Z139 Encounter for screening, unspecified: Secondary | ICD-10-CM

## 2012-02-19 ENCOUNTER — Encounter (INDEPENDENT_AMBULATORY_CARE_PROVIDER_SITE_OTHER): Payer: Self-pay | Admitting: Surgery

## 2012-02-19 ENCOUNTER — Ambulatory Visit (INDEPENDENT_AMBULATORY_CARE_PROVIDER_SITE_OTHER): Payer: Medicare Other | Admitting: Surgery

## 2012-02-19 VITALS — BP 130/72 | HR 74 | Temp 97.6°F | Resp 16 | Ht 64.0 in | Wt 137.0 lb

## 2012-02-19 DIAGNOSIS — L732 Hidradenitis suppurativa: Secondary | ICD-10-CM

## 2012-02-19 NOTE — Patient Instructions (Signed)
Hidradenitis Suppurativa, Sweat Gland Abscess  Hidradenitis suppurativa is a long lasting (chronic), uncommon disease of the sweat glands. With this, boil-like lumps and scarring develop in the groin, some times under the arms (axillae), and under the breasts. It may also uncommonly occur behind the ears, in the crease of the buttocks, and around the genitals.   CAUSES   The cause is from a blocking of the sweat glands. They then become infected. It may cause drainage and odor. It is not contagious. So it cannot be given to someone else. It most often shows up in puberty (about 10 to 57 years of age). But it may happen much later. It is similar to acne which is a disease of the sweat glands. This condition is slightly more common in African-Americans and women.  SYMPTOMS    Hidradenitis usually starts as one or more red, tender, swellings in the groin or under the arms (axilla).   Over a period of hours to days the lesions get larger. They often open to the skin surface, draining clear to yellow-colored fluid.   The infected area heals with scarring.  DIAGNOSIS   Your caregiver makes this diagnosis by looking at you. Sometimes cultures (growing germs on plates in the lab) may be taken. This is to see what germ (bacterium) is causing the infection.   TREATMENT    Topical germ killing medicine applied to the skin (antibiotics) are the treatment of choice. Antibiotics taken by mouth (systemic) are sometimes needed when the condition is getting worse or is severe.   Avoid tight-fitting clothing which traps moisture in.   Dirt does not cause hidradenitis and it is not caused by poor hygiene.   Involved areas should be cleaned daily using an antibacterial soap. Some patients find that the liquid form of Lever 2000, applied to the involved areas as a lotion after bathing, can help reduce the odor related to this condition.   Sometimes surgery is needed to drain infected areas or remove scarred tissue. Removal of  large amounts of tissue is used only in severe cases.   Birth control pills may be helpful.   Oral retinoids (vitamin A derivatives) for 6 to 12 months which are effective for acne may also help this condition.   Weight loss will improve but not cure hidradenitis. It is made worse by being overweight. But the condition is not caused by being overweight.   This condition is more common in people who have had acne.   It may become worse under stress.  There is no medical cure for hidradenitis. It can be controlled, but not cured. The condition usually continues for years with periods of getting worse and getting better (remission).  Document Released: 12/05/2003 Document Revised: 07/15/2011 Document Reviewed: 12/21/2007  [employer] Patient Information 2013 [employer].

## 2012-02-19 NOTE — Progress Notes (Signed)
Chief Complaint:  Recurrent hidradenitis  History of Present Illness:  [name] is an 56 y.o. female with recurrent hidradenitis.  She wants this excised she is tired and frustrated with dealing with that. A total wall we would do is I would localize these and marked in the holding area and take her back and try to excise these areas he continually recur. We'll try to close these primarily although sometimes they have to be left open. She will schedule this at [facility] and keep her overnight.  Past Medical History  Diagnosis Date  . Diabetes mellitus   . Hypertension   . Stroke   . Anxiety   . Heart attack 1988  . Hyperlipidemia   . GERD (gastroesophageal reflux disease)   . Irritable bowel syndrome   . Chronic headaches   . Sinus problem   . Hidradenitis     groin  . Anemia   . Schatzki's ring   . Diverticulosis of colon with hemorrhage April 2013  . Stomach ulcer 1972    non-bleeding  . Chronic asthmatic bronchitis     Past Surgical History  Procedure Date  . Hiatal hernia repair   . Breast surgery 2007, 2008    right cyst removed  . Breast reduction surgery   . Axillary hidradenitis excision 1990-2008    bilateral  . Excision hidradenitis groin and abdomen 2012  . Back surgery   . Cervical disc surgery   . Colonoscopy 08/12/2011    Procedure: COLONOSCOPY;  Surgeon: Meryl Dare, MD,FACG;  Location: [facility] ENDOSCOPY;  Service: Endoscopy;  Laterality: N/A;  . Esophagogastroduodenoscopy 08/12/2011    Procedure: ESOPHAGOGASTRODUODENOSCOPY (EGD);  Surgeon: Meryl Dare, MD,FACG;  Location: [facility] ENDOSCOPY;  Service: Endoscopy;  Laterality: N/A;  . Givens capsule study 08/13/2011    Procedure: GIVENS CAPSULE STUDY;  Surgeon: Meryl Dare, MD,FACG;  Location: [facility] ENDOSCOPY;  Service: Endoscopy;  Laterality: N/A;  . Nissen fundoplication 1996  . Abdominal hysterectomy 1985  . Nasal sinus surgery   . Cholecystectomy   . Foot surgery     bi-lateral  . Colonoscopy  06/19/2006  . Esophagogastroduodenoscopy 06/04/2005    Current Outpatient Prescriptions  Medication Sig Dispense Refill  . ALPRAZolam (XANAX) 0.5 MG tablet Take 0.25 mg by mouth at bedtime as needed. For anxiety      . dicyclomine (BENTYL) 20 MG tablet Take 1 tablet (20 mg total) by mouth 4 (four) times daily -  before meals and at bedtime.  120 tablet  11  . doxycycline (VIBRA-TABS) 100 MG tablet Take 1 tablet (100 mg total) by mouth 2 (two) times daily.  14 tablet  2  . esomeprazole (NEXIUM) 40 MG capsule Take 1 capsule (40 mg total) by mouth daily before breakfast.  15 capsule  0  . fluconazole (DIFLUCAN) 200 MG tablet Take 1 tablet (200 mg total) by mouth daily.  3 tablet  2  . HYDROcodone-acetaminophen (LORTAB 7.5) 7.5-500 MG per tablet Take 1 tablet by mouth every 6 (six) hours as needed for pain.  30 tablet  0  . mometasone (NASONEX) 50 MCG/ACT nasal spray Place 1 spray into the nose daily as needed. For allergies      . mupirocin cream (BACTROBAN) 2 % Apply 1 application topically daily.      . nitroGLYCERIN (NITRODUR - DOSED IN MG/24 HR) 0.4 mg/hr Place 1 patch onto the skin daily.      . nitroGLYCERIN (NITROSTAT) 0.4 MG SL tablet Place 0.4 mg under  the tongue every 5 (five) minutes as needed.      . Olmesartan-Amlodipine-HCTZ (TRIBENZOR) 40-5-12.5 MG TABS Take 1 tablet by mouth daily.      . Potassium Chloride Crys CR (KLOR-CON M20 PO) Take 1 capsule by mouth daily.        . promethazine (PHENERGAN) 25 MG tablet Take 1 tablet (25 mg total) by mouth every 6 (six) hours as needed for nausea.  30 tablet  0  . rosuvastatin (CRESTOR) 20 MG tablet Take 20 mg by mouth daily.      . saxagliptin HCl (ONGLYZA) 5 MG TABS tablet Take 5 mg by mouth daily.      . SUMAtriptan (IMITREX) 50 MG tablet Take 50 mg by mouth daily as needed. For migraine Up to 9 monthly      . traMADol (ULTRAM) 50 MG tablet Take 50 mg by mouth every 6 (six) hours as needed. FOR PAIN      . zolpidem (AMBIEN) 10 MG tablet  Take 10 mg by mouth at bedtime as needed. FOR SLEEP       Iodine; Metformin and related; Shellfish allergy; and Adhesive Family History  Problem Relation Age of Onset  . Breast cancer Mother   . Heart disease Mother   . Hypertension Mother   . Diabetes Father   . Heart disease Father   . Stroke Father   . Heart disease Sister   . Hypertension Sister   . Hypertension Sister   . Hyperlipidemia Sister   . Diabetes Sister   . Emphysema      great uncle  . Aneurysm Sister     brain   Social History:   reports that she has been smoking Cigarettes.  She has been smoking about .3 packs per day. She has never used smokeless tobacco. She reports that she does not drink alcohol or use illicit drugs.   REVIEW OF SYSTEMS - PERTINENT POSITIVES ONLY: noncontributory  Physical Exam:   Blood pressure 130/72, pulse 74, temperature 97.6 F (36.4 C), temperature source Temporal, resp. rate 16, height 5\' 4"  (1.626 m), weight 137 lb (62.143 kg). Body mass index is 23.52 kg/(m^2).  Gen:  WDWN AAF NAD  Neurological: Alert and oriented to person, place, and time. Motor and sensory function is grossly intact  Head: Normocephalic and atraumatic.  Eyes: Conjunctivae are normal. Pupils are equal, round, and reactive to light. No scleral icterus.  Neck: Normal range of motion. Neck supple. No tracheal deviation or thyromegaly present.  Cardiovascular:  SR without murmurs or gallops.  No carotid bruits Respiratory: Effort normal.  No respiratory distress. No chest wall tenderness. Breath sounds normal.  No wheezes, rales or rhonchi.  Abdomen:  The old scars and going down to the perineum there are hard areas principally on the left vulvar region with recurrent hidradenitis. Plan excision and hopeful  primary closure GU: Musculoskeletal: Normal range of motion. Extremities are nontender. No cyanosis, edema or clubbing noted Lymphadenopathy: No cervical, preauricular, postauricular or axillary adenopathy is  present Skin: Skin is warm and dry. No rash noted. No diaphoresis. No erythema. No pallor. Pscyh: Normal mood and affect. Behavior is normal. Judgment and thought content normal.   LABORATORY RESULTS: No results found for this or any previous visit (from the past 48 hour(s)).  RADIOLOGY RESULTS: No results found.  Problem List: Patient Active Problem List  Diagnosis  . DIABETES MELLITUS  . DYSLIPIDEMIA  . HYPERTENSION  . Hidradenitis, left pubic area and left lower quadrant  .  Chest pain at rest  . Diverticulosis of colon with hemorrhage  . Acute posthemorrhagic anemia  . IBS (irritable bowel syndrome)  . GERD (gastroesophageal reflux disease)    Assessment & Plan: Recurrent hidradenitis of the left vulvar region. Plan excision with hopeful primary closure    Matt B. Daphine Deutscher, MD, [facility] [phone] beeper [phone]  02/19/2012 4:34 PM

## 2012-03-11 ENCOUNTER — Ambulatory Visit (INDEPENDENT_AMBULATORY_CARE_PROVIDER_SITE_OTHER): Payer: Medicare Other | Admitting: Surgery

## 2012-03-11 ENCOUNTER — Encounter (INDEPENDENT_AMBULATORY_CARE_PROVIDER_SITE_OTHER): Payer: Self-pay | Admitting: Surgery

## 2012-03-11 VITALS — BP 132/80 | HR 90 | Temp 98.0°F | Resp 18 | Ht 64.0 in | Wt 142.0 lb

## 2012-03-11 DIAGNOSIS — L732 Hidradenitis suppurativa: Secondary | ICD-10-CM

## 2012-03-11 MED ORDER — SULFAMETHOXAZOLE-TRIMETHOPRIM 800-160 MG PO TABS: 2.0000 | ORAL_TABLET | Freq: Two times a day (BID) | ORAL | Status: DC

## 2012-03-11 NOTE — Progress Notes (Signed)
Had acute flair on the left.  These are not red and scabbed over.  There is no way to move up her surgery.  Will treat with Bactrim DS BID to see if that will treat this better.

## 2012-03-25 ENCOUNTER — Encounter (HOSPITAL_COMMUNITY): Payer: Self-pay | Admitting: Pharmacy Technician

## 2012-03-25 ENCOUNTER — Encounter (HOSPITAL_COMMUNITY)
Admission: RE | Admit: 2012-03-25 | Discharge: 2012-03-25 | Disposition: A | Discharge status: Home / Self Care | Payer: Medicare Other | Source: Ambulatory Visit | Attending: Surgery | Admitting: Surgery

## 2012-03-25 ENCOUNTER — Encounter (HOSPITAL_COMMUNITY): Payer: Self-pay

## 2012-03-25 HISTORY — DX: Malignant (primary) neoplasm, unspecified: C80.1

## 2012-03-25 HISTORY — DX: Weakness: R53.1

## 2012-03-25 HISTORY — DX: Obesity, unspecified: E66.9

## 2012-03-25 LAB — BASIC METABOLIC PANEL
BUN: 11 mg/dL (ref 6–23)
CO2: 28 mEq/L (ref 19–32)
Calcium: 10.3 mg/dL (ref 8.4–10.5)
Chloride: 101 mEq/L (ref 96–112)
Creatinine, Ser: 0.7 mg/dL (ref 0.50–1.10)
GFR calc Af Amer: >90 mL/min (ref >90)
GFR calc non Af Amer: >90 mL/min (ref >90)
Glucose, Bld: 197 mg/dL — ABNORMAL HIGH (ref 70–99)
Potassium: 3.9 mEq/L (ref 3.5–5.1)
Sodium: 139 mEq/L (ref 135–145)

## 2012-03-25 LAB — CBC
HCT: 40.9 % (ref 36.0–46.0)
Hemoglobin: 13.7 g/dL (ref 12.0–15.0)
MCH: 27.6 pg (ref 26.0–34.0)
MCHC: 33.5 g/dL (ref 30.0–36.0)
MCV: 82.5 fL (ref 78.0–100.0)
Platelets: 242 10*3/uL (ref 150–400)
RBC: 4.96 MIL/uL (ref 3.87–5.11)
RDW: 13.2 % (ref 11.5–15.5)
WBC: 5.1 10*3/uL (ref 4.0–10.5)

## 2012-03-25 LAB — SURGICAL PCR SCREEN
MRSA, PCR: NEGATIVE
Staphylococcus aureus: NEGATIVE

## 2012-03-25 NOTE — Progress Notes (Addendum)
LOV note Dr. Algie Coffer Cardiology 03/11/12 on chart, Chest x ray 1 view 08/20/11 epic, last EKG in epic was abnormal so EKG repeated in PST today

## 2012-03-25 NOTE — Progress Notes (Signed)
03/25/12 1041  OBSTRUCTIVE SLEEP APNEA  Have you ever been diagnosed with sleep apnea through a sleep study? No  Do you snore loudly (loud enough to be heard through closed doors)?  1  Do you often feel tired, fatigued, or sleepy during the daytime? 0  Has anyone observed you stop breathing during your sleep? 1  Do you have, or are you being treated for high blood pressure? 1  BMI more than 35 kg/m2? 0  Age over 57 years old? 1  Neck circumference greater than 40 cm/18 inches? 0  Gender: 0  Obstructive Sleep Apnea Score 4   Score 4 or greater  Results sent to PCP

## 2012-03-25 NOTE — Progress Notes (Signed)
Need surgery orders now, pt coming for pre-op at 1030 today.

## 2012-03-25 NOTE — Patient Instructions (Signed)
19 [name]  03/25/2012   Your procedure is scheduled on: 04/01/12  Report to [facility] at 0630 AM.  Call this number if you have problems the morning of surgery [phone]: [phone]   Remember:   Do not eat food or drink liquids After Midnight.     Take these medicines the morning of surgery with A SIP OF WATER: crestor, alprazolam if needed   Do not wear jewelry, make-up or nail polish.  Do not wear lotions, powders, or perfumes. You may wear deodorant.  Do not shave 48 hours prior to surgery. Men may shave face and neck.  Do not bring valuables to the hospital.  Contacts, dentures or bridgework may not be worn into surgery.  Leave suitcase in the car. After surgery it may be brought to your room.  For patients admitted to the hospital, checkout time is 11:00 AM the day of discharge.    Special Instructions: Shower using CHG 2 nights before surgery and the night before surgery.  If you shower the day of surgery use CHG.  Use special wash - you have one bottle of CHG for all showers.  You should use approximately 1/3 of the bottle for each shower.   Please read over the following fact sheets that you were given: MRSA Information.  Birdie Sons, RN  pre op nurse call if needed [phone]

## 2012-03-31 ENCOUNTER — Other Ambulatory Visit (INDEPENDENT_AMBULATORY_CARE_PROVIDER_SITE_OTHER): Payer: Self-pay | Admitting: Surgery

## 2012-04-01 ENCOUNTER — Encounter (HOSPITAL_COMMUNITY): Payer: Self-pay | Admitting: Anesthesiology

## 2012-04-01 ENCOUNTER — Encounter (HOSPITAL_COMMUNITY)
Admission: RE | Disposition: A | Discharge status: Home / Self Care | Payer: Self-pay | Source: Ambulatory Visit | Attending: Surgery

## 2012-04-01 ENCOUNTER — Other Ambulatory Visit (INDEPENDENT_AMBULATORY_CARE_PROVIDER_SITE_OTHER): Payer: Self-pay | Admitting: Surgery

## 2012-04-01 ENCOUNTER — Encounter (HOSPITAL_COMMUNITY): Payer: Self-pay | Admitting: *Deleted

## 2012-04-01 ENCOUNTER — Ambulatory Visit (HOSPITAL_COMMUNITY): Payer: Medicare Other | Admitting: Anesthesiology

## 2012-04-01 ENCOUNTER — Observation Stay (HOSPITAL_COMMUNITY)
Admission: RE | Admit: 2012-04-01 | Discharge: 2012-04-03 | Disposition: A | Discharge status: Home / Self Care | Payer: Medicare Other | Source: Ambulatory Visit | Attending: Surgery | Admitting: Surgery

## 2012-04-01 DIAGNOSIS — L732 Hidradenitis suppurativa: Principal | ICD-10-CM | POA: Insufficient documentation

## 2012-04-01 DIAGNOSIS — K5731 Diverticulosis of large intestine without perforation or abscess with bleeding: Secondary | ICD-10-CM | POA: Insufficient documentation

## 2012-04-01 DIAGNOSIS — R0789 Other chest pain: Secondary | ICD-10-CM | POA: Insufficient documentation

## 2012-04-01 DIAGNOSIS — D62 Acute posthemorrhagic anemia: Secondary | ICD-10-CM | POA: Insufficient documentation

## 2012-04-01 DIAGNOSIS — I10 Essential (primary) hypertension: Secondary | ICD-10-CM | POA: Insufficient documentation

## 2012-04-01 DIAGNOSIS — E119 Type 2 diabetes mellitus without complications: Secondary | ICD-10-CM | POA: Insufficient documentation

## 2012-04-01 DIAGNOSIS — K589 Irritable bowel syndrome without diarrhea: Secondary | ICD-10-CM | POA: Insufficient documentation

## 2012-04-01 DIAGNOSIS — Z79899 Other long term (current) drug therapy: Secondary | ICD-10-CM | POA: Insufficient documentation

## 2012-04-01 DIAGNOSIS — K219 Gastro-esophageal reflux disease without esophagitis: Secondary | ICD-10-CM | POA: Insufficient documentation

## 2012-04-01 DIAGNOSIS — Z8673 Personal history of transient ischemic attack (TIA), and cerebral infarction without residual deficits: Secondary | ICD-10-CM | POA: Insufficient documentation

## 2012-04-01 DIAGNOSIS — Z01812 Encounter for preprocedural laboratory examination: Secondary | ICD-10-CM | POA: Insufficient documentation

## 2012-04-01 DIAGNOSIS — E785 Hyperlipidemia, unspecified: Secondary | ICD-10-CM | POA: Insufficient documentation

## 2012-04-01 DIAGNOSIS — Z01810 Encounter for preprocedural cardiovascular examination: Secondary | ICD-10-CM | POA: Insufficient documentation

## 2012-04-01 HISTORY — PX: HYDRADENITIS EXCISION: SHX5243

## 2012-04-01 LAB — CBC
HCT: 37.4 % (ref 36.0–46.0)
Hemoglobin: 12.7 g/dL (ref 12.0–15.0)
MCH: 28 pg (ref 26.0–34.0)
MCHC: 34 g/dL (ref 30.0–36.0)
MCV: 82.4 fL (ref 78.0–100.0)
Platelets: 189 10*3/uL (ref 150–400)
RBC: 4.54 MIL/uL (ref 3.87–5.11)
RDW: 13.2 % (ref 11.5–15.5)
WBC: 6.7 10*3/uL (ref 4.0–10.5)

## 2012-04-01 LAB — CREATININE, SERUM
Creatinine, Ser: 0.79 mg/dL (ref 0.50–1.10)
GFR calc Af Amer: >90 mL/min (ref >90)
GFR calc non Af Amer: >90 mL/min (ref >90)

## 2012-04-01 LAB — GLUCOSE, CAPILLARY
Glucose-Capillary: 176 mg/dL — ABNORMAL HIGH (ref 70–99)
Glucose-Capillary: 183 mg/dL — ABNORMAL HIGH (ref 70–99)
Glucose-Capillary: 159 mg/dL — ABNORMAL HIGH (ref 70–99)
Glucose-Capillary: 177 mg/dL — ABNORMAL HIGH (ref 70–99)

## 2012-04-01 SURGERY — EXCISION, HIDRADENITIS, INGUINAL REGION
Anesthesia: General | Laterality: Bilateral | Wound class: Contaminated

## 2012-04-01 MED ORDER — INSULIN ASPART 100 UNIT/ML ~~LOC~~ SOLN
0.0000 [IU] | SUBCUTANEOUS | Status: DC
  Administered 2012-04-01: 3 [IU] via SUBCUTANEOUS

## 2012-04-01 MED ORDER — HEPARIN SODIUM (PORCINE) 5000 UNIT/ML IJ SOLN
5000.0000 [IU] | Freq: Once | INTRAMUSCULAR | Status: AC
  Administered 2012-04-01: 5000 [IU] via SUBCUTANEOUS
  Filled 2012-04-01: qty 1

## 2012-04-01 MED ORDER — DEXTROSE 5 % IV SOLN
2.0000 g | INTRAVENOUS | Status: AC
  Administered 2012-04-01: 2 g via INTRAVENOUS
  Filled 2012-04-01: qty 2

## 2012-04-01 MED ORDER — BUPIVACAINE LIPOSOME 1.3 % IJ SUSP
20.0000 mL | Freq: Once | INTRAMUSCULAR | Status: DC
  Filled 2012-04-01: qty 20

## 2012-04-01 MED ORDER — BACITRACIN-NEOMYCIN-POLYMYXIN OINTMENT TUBE
TOPICAL_OINTMENT | CUTANEOUS | Status: DC | PRN
  Administered 2012-04-01: 1 via TOPICAL

## 2012-04-01 MED ORDER — ZOLPIDEM TARTRATE 5 MG PO TABS: 5.0000 mg | ORAL_TABLET | Freq: Every evening | ORAL | Status: DC | PRN

## 2012-04-01 MED ORDER — KCL IN DEXTROSE-NACL 20-5-0.45 MEQ/L-%-% IV SOLN
INTRAVENOUS | Status: DC
  Administered 2012-04-01: 17:00:00 via INTRAVENOUS
  Filled 2012-04-01 (×2): qty 1000

## 2012-04-01 MED ORDER — LINAGLIPTIN 5 MG PO TABS
5.0000 mg | ORAL_TABLET | Freq: Every day | ORAL | Status: DC
  Administered 2012-04-01 – 2012-04-03 (×3): 5 mg via ORAL
  Filled 2012-04-01 (×3): qty 1

## 2012-04-01 MED ORDER — SODIUM BICARBONATE 4 % IV SOLN
INTRAVENOUS | Status: AC
  Filled 2012-04-01: qty 5

## 2012-04-01 MED ORDER — SULFAMETHOXAZOLE-TMP DS 800-160 MG PO TABS
2.0000 | ORAL_TABLET | Freq: Two times a day (BID) | ORAL | Status: DC
  Administered 2012-04-01 – 2012-04-03 (×5): 2 via ORAL
  Filled 2012-04-01 (×7): qty 2

## 2012-04-01 MED ORDER — AMLODIPINE BESYLATE 2.5 MG PO TABS
12.5000 mg | ORAL_TABLET | Freq: Every day | ORAL | Status: DC
  Administered 2012-04-02 – 2012-04-03 (×2): 12.5 mg via ORAL
  Filled 2012-04-01 (×2): qty 1

## 2012-04-01 MED ORDER — ACETAMINOPHEN 10 MG/ML IV SOLN
INTRAVENOUS | Status: AC
  Filled 2012-04-01: qty 100

## 2012-04-01 MED ORDER — LACTATED RINGERS IV SOLN: INTRAVENOUS | Status: DC

## 2012-04-01 MED ORDER — OLMESARTAN-AMLODIPINE-HCTZ 40-5-12.5 MG PO TABS: 1.0000 | ORAL_TABLET | Freq: Every morning | ORAL | Status: DC

## 2012-04-01 MED ORDER — IRBESARTAN 300 MG PO TABS
300.0000 mg | ORAL_TABLET | Freq: Every day | ORAL | Status: DC
  Administered 2012-04-02 – 2012-04-03 (×2): 300 mg via ORAL
  Filled 2012-04-01 (×2): qty 1

## 2012-04-01 MED ORDER — 0.9 % SODIUM CHLORIDE (POUR BTL) OPTIME
TOPICAL | Status: DC | PRN
  Administered 2012-04-01: 1000 mL

## 2012-04-01 MED ORDER — ALPRAZOLAM 0.5 MG PO TABS: 0.5000 mg | ORAL_TABLET | Freq: Every evening | ORAL | Status: DC | PRN

## 2012-04-01 MED ORDER — FENTANYL CITRATE 0.05 MG/ML IJ SOLN
INTRAMUSCULAR | Status: AC
  Filled 2012-04-01: qty 2

## 2012-04-01 MED ORDER — CHLORHEXIDINE GLUCONATE 4 % EX LIQD
1.0000 "application " | Freq: Once | CUTANEOUS | Status: DC
  Filled 2012-04-01: qty 15

## 2012-04-01 MED ORDER — LIDOCAINE HCL 1 % IJ SOLN
INTRAMUSCULAR | Status: AC
  Filled 2012-04-01: qty 20

## 2012-04-01 MED ORDER — PROPOFOL 10 MG/ML IV BOLUS
INTRAVENOUS | Status: DC | PRN
  Administered 2012-04-01: 120 mg via INTRAVENOUS
  Administered 2012-04-01: 50 mg via INTRAVENOUS

## 2012-04-01 MED ORDER — HEPARIN SODIUM (PORCINE) 5000 UNIT/ML IJ SOLN
5000.0000 [IU] | Freq: Three times a day (TID) | INTRAMUSCULAR | Status: DC
  Administered 2012-04-01 – 2012-04-03 (×6): 5000 [IU] via SUBCUTANEOUS
  Filled 2012-04-01 (×10): qty 1

## 2012-04-01 MED ORDER — ONDANSETRON HCL 4 MG/2ML IJ SOLN
INTRAMUSCULAR | Status: DC | PRN
  Administered 2012-04-01: 4 mg via INTRAVENOUS

## 2012-04-01 MED ORDER — INSULIN ASPART 100 UNIT/ML ~~LOC~~ SOLN
0.0000 [IU] | Freq: Three times a day (TID) | SUBCUTANEOUS | Status: DC
Start: 2012-04-01 — End: 2012-04-03
  Administered 2012-04-01: 3 [IU] via SUBCUTANEOUS
  Administered 2012-04-02: 2 [IU] via SUBCUTANEOUS
  Administered 2012-04-02: 3 [IU] via SUBCUTANEOUS
  Administered 2012-04-02 – 2012-04-03 (×2): 2 [IU] via SUBCUTANEOUS

## 2012-04-01 MED ORDER — HYDROCODONE-ACETAMINOPHEN 5-325 MG PO TABS
1.0000 | ORAL_TABLET | ORAL | Status: DC | PRN
  Administered 2012-04-01 – 2012-04-02 (×3): 1 via ORAL
  Administered 2012-04-02: 2 via ORAL
  Administered 2012-04-02: 1 via ORAL
  Filled 2012-04-01: qty 1
  Filled 2012-04-01: qty 2
  Filled 2012-04-01 (×4): qty 1

## 2012-04-01 MED ORDER — DIPHENHYDRAMINE HCL 50 MG/ML IJ SOLN: 12.5000 mg | Freq: Four times a day (QID) | INTRAMUSCULAR | Status: DC | PRN

## 2012-04-01 MED ORDER — ONDANSETRON HCL 4 MG/2ML IJ SOLN: 4.0000 mg | Freq: Four times a day (QID) | INTRAMUSCULAR | Status: DC | PRN

## 2012-04-01 MED ORDER — PROMETHAZINE HCL 25 MG/ML IJ SOLN: 6.2500 mg | INTRAMUSCULAR | Status: DC | PRN

## 2012-04-01 MED ORDER — FENTANYL CITRATE 0.05 MG/ML IJ SOLN
25.0000 ug | INTRAMUSCULAR | Status: DC | PRN
  Administered 2012-04-01 (×2): 50 ug via INTRAVENOUS

## 2012-04-01 MED ORDER — MIDAZOLAM HCL 5 MG/5ML IJ SOLN
INTRAMUSCULAR | Status: DC | PRN
  Administered 2012-04-01: 2 mg via INTRAVENOUS

## 2012-04-01 MED ORDER — PANTOPRAZOLE SODIUM 40 MG PO TBEC: 40.0000 mg | DELAYED_RELEASE_TABLET | Freq: Every day | ORAL | Status: DC

## 2012-04-01 MED ORDER — BACITRACIN-NEOMYCIN-POLYMYXIN 400-5-5000 EX OINT
TOPICAL_OINTMENT | CUTANEOUS | Status: AC
  Filled 2012-04-01: qty 1

## 2012-04-01 MED ORDER — BUPIVACAINE LIPOSOME 1.3 % IJ SUSP
INTRAMUSCULAR | Status: DC | PRN
  Administered 2012-04-01: 20 mL

## 2012-04-01 MED ORDER — CEFOXITIN SODIUM-DEXTROSE 1-4 GM-% IV SOLR (PREMIX)
INTRAVENOUS | Status: AC
  Filled 2012-04-01: qty 100

## 2012-04-01 MED ORDER — DIPHENHYDRAMINE HCL 12.5 MG/5ML PO ELIX
12.5000 mg | ORAL_SOLUTION | Freq: Four times a day (QID) | ORAL | Status: DC | PRN
  Administered 2012-04-01 (×2): 12.5 mg via ORAL
  Filled 2012-04-01: qty 5

## 2012-04-01 MED ORDER — FENTANYL CITRATE 0.05 MG/ML IJ SOLN
INTRAMUSCULAR | Status: DC | PRN
  Administered 2012-04-01: 100 ug via INTRAVENOUS
  Administered 2012-04-01 (×2): 25 ug via INTRAVENOUS
  Administered 2012-04-01 (×2): 50 ug via INTRAVENOUS
  Administered 2012-04-01 (×4): 25 ug via INTRAVENOUS

## 2012-04-01 MED ORDER — PANTOPRAZOLE SODIUM 40 MG IV SOLR
40.0000 mg | Freq: Every day | INTRAVENOUS | Status: DC
  Administered 2012-04-01 – 2012-04-02 (×2): 40 mg via INTRAVENOUS
  Filled 2012-04-01 (×2): qty 40

## 2012-04-01 MED ORDER — MORPHINE SULFATE 2 MG/ML IJ SOLN: 1.0000 mg | INTRAMUSCULAR | Status: DC | PRN

## 2012-04-01 MED ORDER — NITROGLYCERIN 0.4 MG SL SUBL: 0.4000 mg | SUBLINGUAL_TABLET | SUBLINGUAL | Status: DC | PRN

## 2012-04-01 MED ORDER — BACITRACIN-NEOMYCIN-POLYMYXIN OINTMENT TUBE
TOPICAL_OINTMENT | Freq: Two times a day (BID) | CUTANEOUS | Status: DC
  Administered 2012-04-01: 1 via TOPICAL
  Administered 2012-04-01 – 2012-04-03 (×4): via TOPICAL
  Filled 2012-04-01 (×2): qty 15

## 2012-04-01 MED ORDER — LACTATED RINGERS IV SOLN
INTRAVENOUS | Status: DC | PRN
  Administered 2012-04-01: 08:00:00 via INTRAVENOUS

## 2012-04-01 MED ORDER — DICYCLOMINE HCL 20 MG PO TABS
20.0000 mg | ORAL_TABLET | Freq: Three times a day (TID) | ORAL | Status: DC
  Administered 2012-04-01 – 2012-04-03 (×8): 20 mg via ORAL
  Filled 2012-04-01 (×12): qty 1

## 2012-04-01 MED ORDER — ATORVASTATIN CALCIUM 10 MG PO TABS
10.0000 mg | ORAL_TABLET | Freq: Every day | ORAL | Status: DC
  Administered 2012-04-01 – 2012-04-02 (×2): 10 mg via ORAL
  Filled 2012-04-01 (×3): qty 1

## 2012-04-01 MED ORDER — CLOPIDOGREL BISULFATE 75 MG PO TABS
75.0000 mg | ORAL_TABLET | Freq: Every day | ORAL | Status: DC
  Administered 2012-04-01 – 2012-04-03 (×3): 75 mg via ORAL
  Filled 2012-04-01 (×3): qty 1

## 2012-04-01 MED ORDER — NITROGLYCERIN 0.4 MG/HR TD PT24
0.4000 mg | MEDICATED_PATCH | TRANSDERMAL | Status: DC | PRN
  Filled 2012-04-01: qty 1

## 2012-04-01 MED ORDER — GLIMEPIRIDE 4 MG PO TABS
4.0000 mg | ORAL_TABLET | Freq: Every day | ORAL | Status: DC
  Administered 2012-04-01 – 2012-04-02 (×2): 4 mg via ORAL
  Filled 2012-04-01 (×4): qty 1

## 2012-04-01 MED ORDER — BUPIVACAINE-EPINEPHRINE (PF) 0.5% -1:200000 IJ SOLN
INTRAMUSCULAR | Status: AC
  Filled 2012-04-01: qty 10

## 2012-04-01 MED ORDER — BUPIVACAINE HCL (PF) 0.5 % IJ SOLN
INTRAMUSCULAR | Status: AC
  Filled 2012-04-01: qty 30

## 2012-04-01 MED ORDER — ACETAMINOPHEN 10 MG/ML IV SOLN
INTRAVENOUS | Status: DC | PRN
  Administered 2012-04-01: 1000 mg via INTRAVENOUS

## 2012-04-01 SURGICAL SUPPLY — 31 items
BLADE HEX COATED 2.75 (ELECTRODE) ×2 IMPLANT
BLADE SURG 15 STRL LF DISP TIS (BLADE) ×1 IMPLANT
BLADE SURG 15 STRL SS (BLADE) ×1
BLADE SURG ROTATE 9660 (MISCELLANEOUS) ×2 IMPLANT
BRIEF STRETCH FOR OB PAD LRG (UNDERPADS AND DIAPERS) ×2 IMPLANT
CANISTER SUCTION 1200CC (MISCELLANEOUS) ×2 IMPLANT
CHLORAPREP W/TINT 26ML (MISCELLANEOUS) IMPLANT
CLOTH 2% CHLOROHEXIDINE 3PK (PERSONAL CARE ITEMS) ×2 IMPLANT
CLOTH BEACON ORANGE TIMEOUT ST (SAFETY) ×2 IMPLANT
DECANTER SPIKE VIAL GLASS SM (MISCELLANEOUS) IMPLANT
DRAPE LG THREE QUARTER DISP (DRAPES) ×2 IMPLANT
DRAPE PED LAPAROTOMY (DRAPES) IMPLANT
DRAPE UTILITY XL STRL (DRAPES) ×2 IMPLANT
ELECT REM PT RETURN 9FT ADLT (ELECTROSURGICAL) ×2
ELECTRODE REM PT RTRN 9FT ADLT (ELECTROSURGICAL) ×1 IMPLANT
GLOVE BIOGEL M 8.0 STRL (GLOVE) ×2 IMPLANT
GOWN PREVENTION PLUS XLARGE (GOWN DISPOSABLE) ×4 IMPLANT
KIT BASIN OR (CUSTOM PROCEDURE TRAY) ×2 IMPLANT
NEEDLE HYPO 25X1 1.5 SAFETY (NEEDLE) ×2 IMPLANT
NS IRRIG 1000ML POUR BTL (IV SOLUTION) ×2 IMPLANT
PACK GENERAL/GYN (CUSTOM PROCEDURE TRAY) IMPLANT
PACK LITHOTOMY IV (CUSTOM PROCEDURE TRAY) ×2 IMPLANT
PAD ABD 7.5X8 STRL (GAUZE/BANDAGES/DRESSINGS) ×2 IMPLANT
PENCIL BUTTON HOLSTER BLD 10FT (ELECTRODE) ×2 IMPLANT
SPONGE LAP 4X18 X RAY DECT (DISPOSABLE) ×4 IMPLANT
SYR CONTROL 10ML LL (SYRINGE) ×2 IMPLANT
TOWEL OR 17X26 10 PK STRL BLUE (TOWEL DISPOSABLE) ×2 IMPLANT
TOWEL OR NON WOVEN STRL DISP B (DISPOSABLE) ×2 IMPLANT
WATER STERILE IRR 1000ML POUR (IV SOLUTION) IMPLANT
YANKAUER SUCT BULB TIP 10FT TU (MISCELLANEOUS) ×2 IMPLANT
YANKAUER SUCT BULB TIP NO VENT (SUCTIONS) IMPLANT

## 2012-04-01 NOTE — Progress Notes (Signed)
Late entry text message to Doctor Daphine Deutscher 1400 03/31/2012 re: need orders (pre op) for patient.

## 2012-04-01 NOTE — Anesthesia Preprocedure Evaluation (Addendum)
Anesthesia Evaluation  Patient identified by MRN, date of birth, ID band Patient awake    Reviewed: Allergy & Precautions, H&P , NPO status , Patient's Chart, lab work & pertinent test results  Airway Mallampati: II TM Distance: >3 FB Neck ROM: Full    Dental  (+) Edentulous Upper and Edentulous Lower   Pulmonary asthma , Current Smoker,  breath sounds clear to auscultation  Pulmonary exam normal       Cardiovascular hypertension, + Past MI Rhythm:Regular Rate:Normal     Neuro/Psych  Headaches, Anxiety CVA, Residual Symptoms negative psych ROS   GI/Hepatic Neg liver ROS, PUD, GERD-  Medicated,  Endo/Other  diabetes, Type 2  Renal/GU negative Renal ROS  negative genitourinary   Musculoskeletal negative musculoskeletal ROS (+)   Abdominal   Peds  Hematology negative hematology ROS (+)   Anesthesia Other Findings   Reproductive/Obstetrics negative OB ROS                          Anesthesia Physical Anesthesia Plan  ASA: III  Anesthesia Plan: General   Post-op Pain Management:    Induction: Intravenous  Airway Management Planned: LMA  Additional Equipment:   Intra-op Plan:   Post-operative Plan: Extubation in OR  Informed Consent: I have reviewed the patients History and Physical, chart, labs and discussed the procedure including the risks, benefits and alternatives for the proposed anesthesia with the patient or authorized representative who has indicated his/her understanding and acceptance.   Dental advisory given  Plan Discussed with: CRNA  Anesthesia Plan Comments:         Anesthesia Quick Evaluation

## 2012-04-01 NOTE — Progress Notes (Signed)
Patient did not do an enema at home spoke to Dr.Martin this am to get pro op orders. OK to hold enema this am .

## 2012-04-01 NOTE — Anesthesia Postprocedure Evaluation (Signed)
Anesthesia Post Note  Patient: [name]  Procedure(s) Performed: Procedure(s) (LRB): EXCISION HYDRADENITIS GROIN (Bilateral)  Anesthesia type: General  Patient location: PACU  Post pain: Pain level controlled  Post assessment: Post-op Vital signs reviewed  Last Vitals:  Filed Vitals:   04/01/12 1100  BP: 177/81  Pulse: 51  Temp: 36.8 C  Resp: 16    Post vital signs: Reviewed  Level of consciousness: sedated  Complications: No apparent anesthesia complications

## 2012-04-01 NOTE — Progress Notes (Signed)
Paged Dr. Daphine Deutscher for diet orders

## 2012-04-01 NOTE — Preoperative (Signed)
Beta Blockers   Reason not to administer Beta Blockers:Not Applicable 

## 2012-04-01 NOTE — Op Note (Signed)
Surgeon: Wenda Low, MD, FACS  Asst:  none  Anes:  general  Procedure: Excision of perineal hidradenitis   Diagnosis: Chronic hidradenitis  Complications: none  EBL:   minimal cc  Description of Procedure:  Patient was taken to room 11 and placed up in the yellow fin stirrups.  The perineum was prepped with PCMX and draped sterilely.  A total of 10 hidradenitis knots some of which had purulent heads were excised with individual elliptical excisions.  These were removed but not sent for path because they were chronically inflamed and consistent with hidradenitis.  Three individual "blackhead" were evacuated.  Bleeding was controlled with electrocautery.  Exparel was injected into the individual wounds (20 cc full strength).  Patient will be admitted for pain control.    Matt B. Daphine Deutscher, MD, [facility], [place] [phone]

## 2012-04-01 NOTE — H&P (Signed)
Chief Complaint: Recurrent hidradenitis  History of Present Illness: [name] is an 56 y.o. female with recurrent hidradenitis. She wants this excised she is tired and frustrated with dealing with that. A total wall we would do is I would localize these and marked in the holding area and take her back and try to excise these areas he continually recur. We'll try to close these primarily although sometimes they have to be left open. She will schedule this at [facility] and keep her overnight.  Past Medical History   Diagnosis  Date   .  Diabetes mellitus    .  Hypertension    .  Stroke    .  Anxiety    .  Heart attack  1988   .  Hyperlipidemia    .  GERD (gastroesophageal reflux disease)    .  Irritable bowel syndrome    .  Chronic headaches    .  Sinus problem    .  Hidradenitis      groin   .  Anemia    .  Schatzki's ring    .  Diverticulosis of colon with hemorrhage  April 2013   .  Stomach ulcer  1972     non-bleeding   .  Chronic asthmatic bronchitis     Past Surgical History   Procedure  Date   .  Hiatal hernia repair    .  Breast surgery  2007, 2008     right cyst removed   .  Breast reduction surgery    .  Axillary hidradenitis excision  1990-2008     bilateral   .  Excision hidradenitis groin and abdomen  2012   .  Back surgery    .  Cervical disc surgery    .  Colonoscopy  08/12/2011     Procedure: COLONOSCOPY; Surgeon: Meryl Dare, MD,FACG; [facility]; Service: Endoscopy; Laterality: N/A;   .  Esophagogastroduodenoscopy  08/12/2011     Procedure: ESOPHAGOGASTRODUODENOSCOPY (EGD); Surgeon: Meryl Dare, MD,FACG; Location: [facility] ENDOSCOPY; Service: Endoscopy; Laterality: N/A;   .  Givens capsule study  08/13/2011     Procedure: GIVENS CAPSULE STUDY; Surgeon: Meryl Dare, MD,FACG; Location: [facility] ENDOSCOPY; Service: Endoscopy; Laterality: N/A;   .  Nissen fundoplication  1996   .  Abdominal hysterectomy  1985   .  Nasal sinus surgery    .   Cholecystectomy    .  Foot surgery      bi-lateral   .  Colonoscopy  06/19/2006   .  Esophagogastroduodenoscopy  06/04/2005    Current Outpatient Prescriptions   Medication  Sig  Dispense  Refill   .  ALPRAZolam (XANAX) 0.5 MG tablet  Take 0.25 mg by mouth at bedtime as needed. For anxiety     .  dicyclomine (BENTYL) 20 MG tablet  Take 1 tablet (20 mg total) by mouth 4 (four) times daily - before meals and at bedtime.  120 tablet  11   .  doxycycline (VIBRA-TABS) 100 MG tablet  Take 1 tablet (100 mg total) by mouth 2 (two) times daily.  14 tablet  2   .  esomeprazole (NEXIUM) 40 MG capsule  Take 1 capsule (40 mg total) by mouth daily before breakfast.  15 capsule  0   .  fluconazole (DIFLUCAN) 200 MG tablet  Take 1 tablet (200 mg total) by mouth daily.  3 tablet  2   .  HYDROcodone-acetaminophen (LORTAB 7.5) 7.5-500  MG per tablet  Take 1 tablet by mouth every 6 (six) hours as needed for pain.  30 tablet  0   .  mometasone (NASONEX) 50 MCG/ACT nasal spray  Place 1 spray into the nose daily as needed. For allergies     .  mupirocin cream (BACTROBAN) 2 %  Apply 1 application topically daily.     .  nitroGLYCERIN (NITRODUR - DOSED IN MG/24 HR) 0.4 mg/hr  Place 1 patch onto the skin daily.     .  nitroGLYCERIN (NITROSTAT) 0.4 MG SL tablet  Place 0.4 mg under the tongue every 5 (five) minutes as needed.     .  Olmesartan-Amlodipine-HCTZ (TRIBENZOR) 40-5-12.5 MG TABS  Take 1 tablet by mouth daily.     .  Potassium Chloride Crys CR (KLOR-CON M20 PO)  Take 1 capsule by mouth daily.     .  promethazine (PHENERGAN) 25 MG tablet  Take 1 tablet (25 mg total) by mouth every 6 (six) hours as needed for nausea.  30 tablet  0   .  rosuvastatin (CRESTOR) 20 MG tablet  Take 20 mg by mouth daily.     .  saxagliptin HCl (ONGLYZA) 5 MG TABS tablet  Take 5 mg by mouth daily.     .  SUMAtriptan (IMITREX) 50 MG tablet  Take 50 mg by mouth daily as needed. For migraine  Up to 9 monthly     .  traMADol (ULTRAM) 50 MG  tablet  Take 50 mg by mouth every 6 (six) hours as needed. FOR PAIN     .  zolpidem (AMBIEN) 10 MG tablet  Take 10 mg by mouth at bedtime as needed. FOR SLEEP      Iodine; Metformin and related; Shellfish allergy; and Adhesive  Family History   Problem  Relation  Age of Onset   .  Breast cancer  Mother    .  Heart disease  Mother    .  Hypertension  Mother    .  Diabetes  Father    .  Heart disease  Father    .  Stroke  Father    .  Heart disease  Sister    .  Hypertension  Sister    .  Hypertension  Sister    .  Hyperlipidemia  Sister    .  Diabetes  Sister    .  Emphysema        great uncle    .  Aneurysm  Sister       brain    Social History: reports that she has been smoking Cigarettes. She has been smoking about .3 packs per day. She has never used smokeless tobacco. She reports that she does not drink alcohol or use illicit drugs.  REVIEW OF SYSTEMS - PERTINENT POSITIVES ONLY:  noncontributory  Physical Exam:  Blood pressure 130/72, pulse 74, temperature 97.6 F (36.4 C), temperature source Temporal, resp. rate 16, height 5\' 4"  (1.626 m), weight 137 lb (62.143 kg).  Body mass index is 23.52 kg/(m^2).  Gen: WDWN AAF NAD  Neurological: Alert and oriented to person, place, and time. Motor and sensory function is grossly intact  Head: Normocephalic and atraumatic.  Eyes: Conjunctivae are normal. Pupils are equal, round, and reactive to light. No scleral icterus.  Neck: Normal range of motion. Neck supple. No tracheal deviation or thyromegaly present.  Cardiovascular: SR without murmurs or gallops. No carotid bruits  Respiratory: Effort normal. No respiratory distress. No chest  wall tenderness. Breath sounds normal. No wheezes, rales or rhonchi.  Abdomen: The old scars and going down to the perineum there are hard areas principally on the left vulvar region with recurrent hidradenitis. Plan excision and hopeful primary closure  GU:  Musculoskeletal: Normal range of motion.  Extremities are nontender. No cyanosis, edema or clubbing noted Lymphadenopathy: No cervical, preauricular, postauricular or axillary adenopathy is present Skin: Skin is warm and dry. No rash noted. No diaphoresis. No erythema. No pallor. Pscyh: Normal mood and affect. Behavior is normal. Judgment and thought content normal.  LABORATORY RESULTS:  No results found for this or any previous visit (from the past 48 hour(s)).  RADIOLOGY RESULTS:  No results found.  Problem List:  Patient Active Problem List   Diagnosis   .  DIABETES MELLITUS   .  DYSLIPIDEMIA   .  HYPERTENSION   .  Hidradenitis, left pubic area and left lower quadrant   .  Chest pain at rest   .  Diverticulosis of colon with hemorrhage   .  Acute posthemorrhagic anemia   .  IBS (irritable bowel syndrome)   .  GERD (gastroesophageal reflux disease)    Assessment & Plan:  Recurrent hidradenitis of the left vulvar region. Plan excision with hopeful primary closure  Matt B. Daphine Deutscher, MD, [facility]  [phone] beeper  [phone]

## 2012-04-01 NOTE — Transfer of Care (Signed)
Immediate Anesthesia Transfer of Care Note  Patient: [name]  Procedure(s) Performed: Procedure(s) (LRB) with comments: EXCISION HYDRADENITIS GROIN (Bilateral) - Excision of Hydradenitis of Perineum  Patient Location: PACU  Anesthesia Type:General  Level of Consciousness: awake, alert , oriented and patient cooperative  Airway & Oxygen Therapy: Patient Spontanous Breathing and Patient connected to face mask oxygen  Post-op Assessment: Report given to PACU RN and Post -op Vital signs reviewed and stable  Post vital signs: Reviewed and stable  Complications: No apparent anesthesia complications

## 2012-04-02 ENCOUNTER — Encounter (HOSPITAL_COMMUNITY): Payer: Self-pay | Admitting: *Deleted

## 2012-04-02 LAB — GLUCOSE, CAPILLARY
Glucose-Capillary: 177 mg/dL — ABNORMAL HIGH (ref 70–99)
Glucose-Capillary: 111 mg/dL — ABNORMAL HIGH (ref 70–99)
Glucose-Capillary: 123 mg/dL — ABNORMAL HIGH (ref 70–99)
Glucose-Capillary: 133 mg/dL — ABNORMAL HIGH (ref 70–99)

## 2012-04-02 LAB — HEMOGLOBIN A1C
Hgb A1c MFr Bld: 8 % — ABNORMAL HIGH (ref <5.7)
Mean Plasma Glucose: 183 mg/dL — ABNORMAL HIGH (ref <117)

## 2012-04-02 MED ORDER — PANTOPRAZOLE SODIUM 40 MG PO TBEC
40.0000 mg | DELAYED_RELEASE_TABLET | Freq: Every day | ORAL | Status: DC
  Administered 2012-04-03: 40 mg via ORAL
  Filled 2012-04-02: qty 1

## 2012-04-02 NOTE — Progress Notes (Signed)
1 Day Post-Op  Subjective: Still with some "throbbing" in the groin  Objective: Vital signs in last 24 hours: Temp:  [97.6 F (36.4 C)-99.5 F (37.5 C)] 99.5 F (37.5 C) (11/28 4540) Pulse Rate:  [31-96] 57  (11/28 0642) Resp:  [13-18] 18  (11/28 0642) BP: (116-188)/(65-93) 126/73 mmHg (11/28 0642) SpO2:  [91 %-100 %] 97 % (11/28 0642) Weight:  [139 lb (63.05 kg)] 139 lb (63.05 kg) (11/27 1234)    Intake/Output from previous day: 11/27 0701 - 11/28 0700 In: 2495 [P.O.:840; I.V.:1655] Out: 3000 [Urine:2950; Blood:50] Intake/Output this shift:    General appearance: alert, cooperative and no distress Skin: wounds examined with chaperone.  No sign of infection.  Appropriate pain  Lab Results:   Basename 04/01/12 1318  WBC 6.7  HGB 12.7  HCT 37.4  PLT 189   BMET  Basename 04/01/12 1318  NA --  K --  CL --  CO2 --  GLUCOSE --  BUN --  CREATININE 0.79  CALCIUM --   PT/INR No results found for this basename: LABPROT:2,INR:2 in the last 72 hours ABG No results found for this basename: PHART:2,PCO2:2,PO2:2,HCO3:2 in the last 72 hours  Studies/Results: No results found.  Anti-infectives: Anti-infectives     Start     Dose/Rate Route Frequency Ordered Stop   04/01/12 1300  sulfamethoxazole-trimethoprim (BACTRIM DS) 800-160 MG per tablet 2 tablet       2 tablet Oral 2 times daily 04/01/12 1237     04/01/12 0730   cefOXitin (MEFOXIN) 2 g in dextrose 5 % 50 mL IVPB        2 g 100 mL/hr over 30 Minutes Intravenous On call to O.R. 04/01/12 9811 04/01/12 0846          Assessment/Plan: s/p Procedure(s) (LRB) with comments: EXCISION HYDRADENITIS GROIN (Bilateral) - Excision of Hydradenitis of Perineum she looks fine.  Her wounds are okay, no sign of infection.  She is planning on discharge tomorrow since she lives alone.  LOS: 1 day    Lodema Pilot DAVID 04/02/2012

## 2012-04-02 NOTE — Progress Notes (Signed)
This patient is receiving IV Protonix. Based on criteria approved by the Pharmacy and Therapeutics Committee, this medication is being converted to the equivalent oral dose form. These criteria include:   . The patient is eating (either orally or per tube) and/or has been taking other orally administered medications for at least 24 hours.  . This patient has no evidence of active gastrointestinal bleeding or impaired GI absorption (gastrectomy, short bowel, patient on TNA or NPO).   If you have questions about this conversion, please contact the pharmacy department.  Dannielle Huh, [facility] 04/02/2012 12:44 PM

## 2012-04-03 LAB — GLUCOSE, CAPILLARY
Glucose-Capillary: 141 mg/dL — ABNORMAL HIGH (ref 70–99)
Glucose-Capillary: 110 mg/dL — ABNORMAL HIGH (ref 70–99)

## 2012-04-03 MED ORDER — SULFAMETHOXAZOLE-TMP DS 800-160 MG PO TABS: 2.0000 | ORAL_TABLET | Freq: Two times a day (BID) | ORAL | Status: DC

## 2012-04-03 MED ORDER — BACITRACIN-NEOMYCIN-POLYMYXIN OINTMENT TUBE: 1.0000 "application " | TOPICAL_OINTMENT | Freq: Two times a day (BID) | CUTANEOUS | Status: DC

## 2012-04-03 NOTE — Progress Notes (Signed)
Patient discharged via wheelchair. Rx given for bactrim DS. States understanding of discharge instructions.

## 2012-04-03 NOTE — Care Management Note (Signed)
    Page 1 of 1   04/03/2012     12:49:14 PM   CARE MANAGEMENT NOTE 04/03/2012  Patient:  [name], [name]   Account Number:  [id number]  Date Initiated:  04/03/2012  Documentation initiated by:  Lorenda Ishihara  Subjective/Objective Assessment:   56 yo female admitted s/p I&D of vulva lesions related to hidrenitis. PTA lived at home alone.     Action/Plan:   home when stable   Anticipated DC Date:  04/03/2012   Anticipated DC Plan:  HOME/SELF CARE      DC Planning Services  CM consult      Choice offered to / List presented to:             Status of service:  Completed, signed off Medicare Important Message given?   (If response is "NO", the following Medicare IM given date fields will be blank) Date Medicare IM given:   Date Additional Medicare IM given:    Discharge Disposition:  HOME/SELF CARE  Per UR Regulation:  Reviewed for med. necessity/level of care/duration of stay  If discussed at Long Length of Stay Meetings, dates discussed:    Comments:

## 2012-04-03 NOTE — Discharge Summary (Signed)
Physician Discharge Summary  Patient ID: [name] MRN: [id number] DOB/AGE: [birth year] 56 y.o.  Admit date: 04/01/2012 Discharge date: 04/03/2012  Admitting Diagnosis: Hidradenitis, left pubic area and left lower quadrant  Discharge Diagnosis Patient Active Problem List   Diagnosis Date Noted  . IBS (irritable bowel syndrome) 09/12/2011  . GERD (gastroesophageal reflux disease) 09/12/2011  . Diverticulosis of colon with hemorrhage 08/10/2011  . Acute posthemorrhagic anemia 08/10/2011  . Chest pain at rest 07/06/2011    Class: Acute  . Hidradenitis, left pubic area and left lower quadrant 01/18/2011  . DIABETES MELLITUS 08/24/2007  . DYSLIPIDEMIA 08/24/2007  . HYPERTENSION 08/24/2007    Consultants None  Procedures Excision of perineal hidradenitis   Hospital Course:  56 yr old female with hidradenitis of the left pubic area and left lower quadrant.  She was admitted and underwent the procedure above.  Tolerated well.  On POD#2, her pain was well controlled, the wounds were healthy and she was ready for discharge home.  She will follow up next week for wound check.     Medication List     As of 04/03/2012  8:00 AM    TAKE these medications         ALPRAZolam 0.5 MG tablet   Commonly known as: XANAX   Take 0.5 mg by mouth at bedtime as needed. For anxiety      clopidogrel 75 MG tablet   Commonly known as: PLAVIX   Take 75 mg by mouth daily.      dicyclomine 20 MG tablet   Commonly known as: BENTYL   Take 20 mg by mouth 4 (four) times daily -  before meals and at bedtime.      doxycycline 100 MG tablet   Commonly known as: VIBRA-TABS   Take 1 tablet (100 mg total) by mouth 2 (two) times daily.      esomeprazole 40 MG capsule   Commonly known as: NEXIUM   Take 1 capsule (40 mg total) by mouth daily before breakfast.      fluconazole 200 MG tablet   Commonly known as: DIFLUCAN   Take 200 mg by mouth as needed.      glimepiride 4 MG tablet   Commonly  known as: AMARYL   Take 4 mg by mouth at bedtime.      HYDROcodone-acetaminophen 7.5-500 MG per tablet   Commonly known as: LORTAB   Take 1 tablet by mouth every 6 (six) hours as needed for pain.      KLOR-CON M20 PO   Take 1 capsule by mouth daily.      mometasone 50 MCG/ACT nasal spray   Commonly known as: NASONEX   Place 1 spray into the nose daily as needed. For allergies      neomycin-bacitracin-polymyxin Oint   Commonly known as: NEOSPORIN   Apply 1 application topically 2 (two) times daily.      nitroGLYCERIN 0.4 mg/hr   Commonly known as: NITRODUR - Dosed in mg/24 hr   Place 1 patch onto the skin as needed. Chest pains      nitroGLYCERIN 0.4 MG SL tablet   Commonly known as: NITROSTAT   Place 0.4 mg under the tongue every 5 (five) minutes as needed. Chest pains      promethazine 25 MG tablet   Commonly known as: PHENERGAN   Take 25 mg by mouth every 6 (six) hours as needed.      rosuvastatin 20 MG tablet   Commonly known as:  CRESTOR   Take 20 mg by mouth every morning.      saxagliptin HCl 5 MG Tabs tablet   Commonly known as: ONGLYZA   Take 5 mg by mouth daily.      sulfamethoxazole-trimethoprim 800-160 MG per tablet   Commonly known as: BACTRIM DS,SEPTRA DS   Take 2 tablets by mouth 2 (two) times daily.      sulfamethoxazole-trimethoprim 800-160 MG per tablet   Commonly known as: BACTRIM DS   Take 2 tablets by mouth 2 (two) times daily.      traMADol 50 MG tablet   Commonly known as: ULTRAM   Take 50 mg by mouth every 6 (six) hours as needed. FOR PAIN      TRIBENZOR 40-5-12.5 MG Tabs   Generic drug: Olmesartan-Amlodipine-HCTZ   Take 1 tablet by mouth every morning.      zolpidem 10 MG tablet   Commonly known as: AMBIEN   Take 5 mg by mouth at bedtime as needed. FOR SLEEP             Follow-up Information    Follow up with MARTIN,MATTHEW B, MD. In 1 week. (Our office will call you with your appointment day and time)    Contact information:    [address] [place] [address] [phone]          Signed: Denny Levy [facility] [phone]  04/03/2012, 8:00 AM

## 2012-04-06 ENCOUNTER — Telehealth (INDEPENDENT_AMBULATORY_CARE_PROVIDER_SITE_OTHER): Payer: Self-pay | Admitting: General Surgery

## 2012-04-06 NOTE — Telephone Encounter (Signed)
Spoke with pt and informed her that her first PO will be on 12/13 at 11:00.

## 2012-04-10 ENCOUNTER — Other Ambulatory Visit (INDEPENDENT_AMBULATORY_CARE_PROVIDER_SITE_OTHER): Payer: Self-pay | Admitting: General Surgery

## 2012-04-13 ENCOUNTER — Telehealth (INDEPENDENT_AMBULATORY_CARE_PROVIDER_SITE_OTHER): Payer: Self-pay | Admitting: General Surgery

## 2012-04-13 NOTE — Telephone Encounter (Signed)
Pt called to alert Dr. Daphine Deutscher she had a reaction to the Bactrim prescribed for her postoperatively.  Allergy section of the chart was updated and Dr. Daphine Deutscher paged for new orders.

## 2012-04-17 ENCOUNTER — Encounter (INDEPENDENT_AMBULATORY_CARE_PROVIDER_SITE_OTHER): Payer: Self-pay | Admitting: Surgery

## 2012-04-17 ENCOUNTER — Ambulatory Visit (INDEPENDENT_AMBULATORY_CARE_PROVIDER_SITE_OTHER): Payer: Medicare Other | Admitting: Surgery

## 2012-04-17 VITALS — BP 160/88 | HR 117 | Temp 97.7°F | Resp 20 | Ht 63.5 in | Wt 143.7 lb

## 2012-04-17 DIAGNOSIS — L732 Hidradenitis suppurativa: Secondary | ICD-10-CM

## 2012-04-17 NOTE — Progress Notes (Signed)
[name] 56 y.o.  Body mass index is 25.05 kg/(m^2).  Patient Active Problem List  Diagnosis  . DIABETES MELLITUS  . DYSLIPIDEMIA  . HYPERTENSION  . Hidradenitis, left pubic area and left lower quadrant  . Chest pain at rest  . Diverticulosis of colon with hemorrhage  . Acute posthemorrhagic anemia  . IBS (irritable bowel syndrome)  . GERD (gastroesophageal reflux disease)    Allergies  Allergen Reactions  . Bactrim (Sulfamethoxazole W-Trimethoprim) Shortness Of Breath  . Iodine Swelling  . Metformin And Related Diarrhea  . Adhesive (Tape) Rash  . Shellfish Allergy Rash    Past Surgical History  Procedure Date  . Hiatal hernia repair   . Breast reduction surgery   . Axillary hidradenitis excision 1990-2008    bilateral  . Excision hidradenitis groin and abdomen 2012  . Cervical disc surgery   . Colonoscopy 08/12/2011    Procedure: COLONOSCOPY;  Surgeon: Meryl Dare, MD,FACG;  Location: [facility] ENDOSCOPY;  Service: Endoscopy;  Laterality: N/A;  . Esophagogastroduodenoscopy 08/12/2011    Procedure: ESOPHAGOGASTRODUODENOSCOPY (EGD);  Surgeon: Meryl Dare, MD,FACG;  Location: [facility] ENDOSCOPY;  Service: Endoscopy;  Laterality: N/A;  . Givens capsule study 08/13/2011    Procedure: GIVENS CAPSULE STUDY;  Surgeon: Meryl Dare, MD,FACG;  Location: [facility] ENDOSCOPY;  Service: Endoscopy;  Laterality: N/A;  . Nissen fundoplication 1996  . Nasal sinus surgery   . Foot surgery     bi-lateral  . Colonoscopy 06/19/2006  . Esophagogastroduodenoscopy 06/04/2005  . Cardiac catheterization 2004    mild disease  . Back surgery 2008    lower back x2   . Breast surgery 2007, 2008    right cyst removed, reduction  . Abdominal hysterectomy 1985  . Cholecystectomy 1990's  . Hydradenitis excision 04/01/2012    Procedure: EXCISION HYDRADENITIS GROIN;  Surgeon: Valarie Merino, MD;  Location: [facility] ORS;  Service: General;  Laterality: Bilateral;  Excision of Hydradenitis of Perineum    KADAKIA,AJAY S, MD 1. Vulval hidradenitis suppurativa     Pain is much better after excisions.  Using Doxycycline as she couldn't take Bactrim.  Incisions look good.  Return in 6 weeks Matt B. Daphine Deutscher, MD, [facility] [phone] beeper [phone]  04/17/2012 11:24 AM

## 2012-04-19 ENCOUNTER — Emergency Department (HOSPITAL_COMMUNITY): Payer: Medicare Other

## 2012-04-19 ENCOUNTER — Encounter (HOSPITAL_COMMUNITY): Payer: Self-pay | Admitting: Emergency Medicine

## 2012-04-19 ENCOUNTER — Emergency Department (HOSPITAL_COMMUNITY)
Admission: EM | Admit: 2012-04-19 | Discharge: 2012-04-20 | Disposition: A | Discharge status: Home / Self Care | Payer: Medicare Other | Attending: Emergency Medicine | Admitting: Emergency Medicine

## 2012-04-19 DIAGNOSIS — F17200 Nicotine dependence, unspecified, uncomplicated: Secondary | ICD-10-CM | POA: Insufficient documentation

## 2012-04-19 DIAGNOSIS — Y9289 Other specified places as the place of occurrence of the external cause: Secondary | ICD-10-CM | POA: Insufficient documentation

## 2012-04-19 DIAGNOSIS — G8929 Other chronic pain: Secondary | ICD-10-CM | POA: Insufficient documentation

## 2012-04-19 DIAGNOSIS — T17208A Unspecified foreign body in pharynx causing other injury, initial encounter: Secondary | ICD-10-CM | POA: Insufficient documentation

## 2012-04-19 DIAGNOSIS — Z8543 Personal history of malignant neoplasm of ovary: Secondary | ICD-10-CM | POA: Insufficient documentation

## 2012-04-19 DIAGNOSIS — Y9389 Activity, other specified: Secondary | ICD-10-CM | POA: Insufficient documentation

## 2012-04-19 DIAGNOSIS — Z8679 Personal history of other diseases of the circulatory system: Secondary | ICD-10-CM | POA: Insufficient documentation

## 2012-04-19 DIAGNOSIS — IMO0002 Reserved for concepts with insufficient information to code with codable children: Secondary | ICD-10-CM | POA: Insufficient documentation

## 2012-04-19 DIAGNOSIS — Z8673 Personal history of transient ischemic attack (TIA), and cerebral infarction without residual deficits: Secondary | ICD-10-CM | POA: Insufficient documentation

## 2012-04-19 DIAGNOSIS — Z87448 Personal history of other diseases of urinary system: Secondary | ICD-10-CM | POA: Insufficient documentation

## 2012-04-19 DIAGNOSIS — D619 Aplastic anemia, unspecified: Secondary | ICD-10-CM | POA: Insufficient documentation

## 2012-04-19 DIAGNOSIS — E785 Hyperlipidemia, unspecified: Secondary | ICD-10-CM | POA: Insufficient documentation

## 2012-04-19 DIAGNOSIS — F411 Generalized anxiety disorder: Secondary | ICD-10-CM | POA: Insufficient documentation

## 2012-04-19 DIAGNOSIS — E669 Obesity, unspecified: Secondary | ICD-10-CM | POA: Insufficient documentation

## 2012-04-19 DIAGNOSIS — J45909 Unspecified asthma, uncomplicated: Secondary | ICD-10-CM | POA: Insufficient documentation

## 2012-04-19 DIAGNOSIS — E119 Type 2 diabetes mellitus without complications: Secondary | ICD-10-CM | POA: Insufficient documentation

## 2012-04-19 DIAGNOSIS — Z8719 Personal history of other diseases of the digestive system: Secondary | ICD-10-CM | POA: Insufficient documentation

## 2012-04-19 DIAGNOSIS — K589 Irritable bowel syndrome without diarrhea: Secondary | ICD-10-CM | POA: Insufficient documentation

## 2012-04-19 DIAGNOSIS — Z79899 Other long term (current) drug therapy: Secondary | ICD-10-CM | POA: Insufficient documentation

## 2012-04-19 DIAGNOSIS — Q391 Atresia of esophagus with tracheo-esophageal fistula: Secondary | ICD-10-CM | POA: Insufficient documentation

## 2012-04-19 DIAGNOSIS — R0989 Other specified symptoms and signs involving the circulatory and respiratory systems: Secondary | ICD-10-CM

## 2012-04-19 DIAGNOSIS — I10 Essential (primary) hypertension: Secondary | ICD-10-CM | POA: Insufficient documentation

## 2012-04-19 DIAGNOSIS — K219 Gastro-esophageal reflux disease without esophagitis: Secondary | ICD-10-CM | POA: Insufficient documentation

## 2012-04-19 NOTE — ED Provider Notes (Signed)
History     CSN: [id number]  Arrival date & time 04/19/12  2207   First MD Initiated Contact with Patient 04/19/12 2212      Chief Complaint  Patient presents with  . Foreign Body    (Consider location/radiation/quality/duration/timing/severity/associated sxs/prior treatment) Patient is a 56 y.o. female presenting with foreign body. The history is provided by the patient.  Foreign Body  The current episode started 3 to 5 hours ago. The foreign body is suspected to be swallowed. Suspected object: a piece of soft peppermint. The incident was witnessed/reported by the patient. Pertinent negatives include no chest pain, no fever, no abdominal pain, no vomiting, no drooling, no nosebleeds, no sore throat, no trouble swallowing, no cough and no difficulty breathing. She has been behaving normally. Her past medical history does not include prior foreign body removal or esophageal disease. There were no sick contacts.    Past Medical History  Diagnosis Date  . Diabetes mellitus   . Hypertension   . Anxiety   . Hyperlipidemia   . GERD (gastroesophageal reflux disease)   . Irritable bowel syndrome   . Chronic headaches   . Sinus problem   . Hidradenitis     groin  . Anemia   . Schatzki's ring   . Diverticulosis of colon with hemorrhage April 2013  . Stomach ulcer 1972    non-bleeding  . Chronic asthmatic bronchitis   . Obesity   . Heart attack     2003 mild MI, March 2013 mild MI  . Stroke 2009, 2010, 2011    total 3 strokes  . Left-sided weakness     since stroke, left eye trouble seeing  . Cancer 1985    ovarian, no treatment except surgery  . H/O blood transfusion reaction 1985, April 2013    Past Surgical History  Procedure Date  . Hiatal hernia repair   . Breast reduction surgery   . Axillary hidradenitis excision 1990-2008    bilateral  . Excision hidradenitis groin and abdomen 2012  . Cervical disc surgery   . Colonoscopy 08/12/2011    Procedure: COLONOSCOPY;   Surgeon: Meryl Dare, MD,FACG;  Location: [facility] ENDOSCOPY;  Service: Endoscopy;  Laterality: N/A;  . Esophagogastroduodenoscopy 08/12/2011    Procedure: ESOPHAGOGASTRODUODENOSCOPY (EGD);  Surgeon: Meryl Dare, MD,FACG;  Location: [facility] ENDOSCOPY;  Service: Endoscopy;  Laterality: N/A;  . Givens capsule study 08/13/2011    Procedure: GIVENS CAPSULE STUDY;  Surgeon: Meryl Dare, MD,FACG;  Location: [facility] ENDOSCOPY;  Service: Endoscopy;  Laterality: N/A;  . Nissen fundoplication 1996  . Nasal sinus surgery   . Foot surgery     bi-lateral  . Colonoscopy 06/19/2006  . Esophagogastroduodenoscopy 06/04/2005  . Cardiac catheterization 2004    mild disease  . Back surgery 2008    lower back x2   . Breast surgery 2007, 2008    right cyst removed, reduction  . Abdominal hysterectomy 1985  . Cholecystectomy 1990's  . Hydradenitis excision 04/01/2012    Procedure: EXCISION HYDRADENITIS GROIN;  Surgeon: Valarie Merino, MD;  Location: [facility] ORS;  Service: General;  Laterality: Bilateral;  Excision of Hydradenitis of Perineum    Family History  Problem Relation Age of Onset  . Breast cancer Mother   . Heart disease Mother   . Hypertension Mother   . Diabetes Father   . Heart disease Father   . Stroke Father   . Heart disease Sister   . Hypertension Sister   . Hypertension Sister   .  Hyperlipidemia Sister   . Diabetes Sister   . Emphysema      great uncle  . Aneurysm Sister     brain    History  Substance Use Topics  . Smoking status: Current Some Day Smoker -- 0.3 packs/day for 30 years    Types: Cigarettes  . Smokeless tobacco: Never Used     Comment: trying to wuit... less than .25 pack  . Alcohol Use: No    OB History    Grav Para Term Preterm Abortions TAB SAB Ect Mult Living                  Review of Systems  Constitutional: Negative for fever and activity change.       All ROS Neg except as noted in HPI  HENT: Negative for nosebleeds, sore throat, drooling, trouble  swallowing and neck pain.   Eyes: Negative for photophobia and discharge.  Respiratory: Negative for cough, shortness of breath and wheezing.   Cardiovascular: Negative for chest pain and palpitations.  Gastrointestinal: Negative for vomiting, abdominal pain and blood in stool.  Genitourinary: Negative for dysuria, frequency and hematuria.  Musculoskeletal: Negative for back pain and arthralgias.  Skin: Negative.   Neurological: Negative for dizziness, seizures and speech difficulty.  Psychiatric/Behavioral: Negative for hallucinations and confusion.    Allergies  Bactrim; Sulfa antibiotics; Iodine; Metformin and related; Adhesive; and Shellfish allergy  Home Medications   Current Outpatient Rx  Name  Route  Sig  Dispense  Refill  . ALPRAZOLAM 0.5 MG PO TABS   Oral   Take 0.5 mg by mouth at bedtime as needed. For anxiety         . CLOPIDOGREL BISULFATE 75 MG PO TABS   Oral   Take 75 mg by mouth daily.         Marland Kitchen DOXYCYCLINE HYCLATE 100 MG PO TABS   Oral   Take 100 mg by mouth 2 (two) times daily.         Marland Kitchen ESOMEPRAZOLE MAGNESIUM 40 MG PO CPDR   Oral   Take 1 capsule (40 mg total) by mouth daily before breakfast.   15 capsule   0   . GLIMEPIRIDE 4 MG PO TABS   Oral   Take 4 mg by mouth at bedtime.         . MOMETASONE FUROATE 50 MCG/ACT NA SUSP   Nasal   Place 1 spray into the nose daily as needed. For allergies         . BACITRACIN-NEOMYCIN-POLYMYXIN OINTMENT TUBE   Topical   Apply 1 application topically 2 (two) times daily.         Marland Kitchen OLMESARTAN-AMLODIPINE-HCTZ 40-5-12.5 MG PO TABS   Oral   Take 1 tablet by mouth every morning.          Marland Kitchen KLOR-CON M20 PO   Oral   Take 1 capsule by mouth daily.          Marland Kitchen PROMETHAZINE HCL 25 MG PO TABS   Oral   Take 1 tablet (25 mg total) by mouth every 6 (six) hours as needed for nausea.   30 tablet   0   . ROSUVASTATIN CALCIUM 20 MG PO TABS   Oral   Take 20 mg by mouth every morning.          Marland Kitchen  SAXAGLIPTIN HCL 5 MG PO TABS   Oral   Take 5 mg by mouth daily.         Marland Kitchen  TRAMADOL HCL 50 MG PO TABS   Oral   Take 50 mg by mouth every 6 (six) hours as needed. FOR PAIN         . ZOLPIDEM TARTRATE 10 MG PO TABS   Oral   Take 5 mg by mouth at bedtime as needed. FOR SLEEP         . NITROGLYCERIN 0.4 MG/HR TD PT24   Transdermal   Place 1 patch onto the skin as needed. Chest pains         . NITROGLYCERIN 0.4 MG SL SUBL   Sublingual   Place 0.4 mg under the tongue every 5 (five) minutes as needed. Chest pains           BP 137/69  Pulse 100  Temp 98.1 F (36.7 C) (Oral)  Resp 14  Ht 5\' 4"  (1.626 m)  Wt 140 lb (63.504 kg)  BMI 24.03 kg/m2  SpO2 98%  Physical Exam  Nursing note and vitals reviewed. Constitutional: She is oriented to person, place, and time. She appears well-developed and well-nourished.  Non-toxic appearance.  HENT:  Head: Normocephalic.  Right Ear: Tympanic membrane and external ear normal.  Left Ear: Tympanic membrane and external ear normal.       Airway is patent. No foreign body in the posterior pharynx. No evidence of scratching or swelling in the posterior pharynx. Speech is clear and understandable.  Eyes: EOM and lids are normal. Pupils are equal, round, and reactive to light.  Neck: Normal range of motion. Neck supple. Carotid bruit is not present.       No stridor appreciated. Trachea is in the midline.  Cardiovascular: Normal rate, regular rhythm, normal heart sounds, intact distal pulses and normal pulses.   Pulmonary/Chest: Breath sounds normal. No respiratory distress. She has no wheezes.  Abdominal: Soft. Bowel sounds are normal. There is no tenderness. There is no guarding.  Musculoskeletal: Normal range of motion.  Lymphadenopathy:       Head (right side): No submandibular adenopathy present.       Head (left side): No submandibular adenopathy present.    She has no cervical adenopathy.  Neurological: She is alert and oriented  to person, place, and time. She has normal strength. No cranial nerve deficit or sensory deficit.  Skin: Skin is warm and dry.  Psychiatric: Her speech is normal. Her mood appears anxious.    ED Course  Procedures (including critical care time)  Labs Reviewed - No data to display No results found.   No diagnosis found.    MDM  I have reviewed nursing notes, vital signs, and all appropriate lab and imaging results for this patient. Soft tissue neck x-ray reveals a C5-C6 anterior plate and screw fixation. There is C4-C5 degenerative changes, but no foreign body noted no shift of any of the soft tissues.  Patient continues to speak with complete sentences and without problem during the entire emergency department visit. Patient drank ginger ale without problem. Patient reassured of the exam findings and x-ray findings. Patient advised to return to the emergency department if any changes, problems, or concerns. And       Kathie Dike, [place] 04/20/12 [phone]

## 2012-04-19 NOTE — ED Notes (Signed)
Patient reports that she swallowed a soft peppermint around 1700 and feels as if it is still stuck in throat.

## 2012-04-20 NOTE — ED Provider Notes (Signed)
Medical screening examination/treatment/procedure(s) were performed by non-physician practitioner and as supervising physician I was immediately available for consultation/collaboration.  Nicoletta Dress. Colon Branch, MD 04/20/12 [phone]

## 2012-05-29 ENCOUNTER — Encounter (INDEPENDENT_AMBULATORY_CARE_PROVIDER_SITE_OTHER): Payer: Medicare Other | Admitting: Surgery

## 2012-09-14 ENCOUNTER — Observation Stay (HOSPITAL_COMMUNITY)
Admission: AD | Admit: 2012-09-14 | Discharge: 2012-09-15 | Disposition: A | Discharge status: Home / Self Care | Payer: Medicare Other | Source: Ambulatory Visit | Attending: Cardiovascular Disease | Admitting: Cardiovascular Disease

## 2012-09-14 ENCOUNTER — Observation Stay (HOSPITAL_COMMUNITY): Payer: Medicare Other

## 2012-09-14 ENCOUNTER — Encounter (HOSPITAL_COMMUNITY): Payer: Self-pay | Admitting: General Practice

## 2012-09-14 DIAGNOSIS — E7800 Pure hypercholesterolemia, unspecified: Secondary | ICD-10-CM | POA: Insufficient documentation

## 2012-09-14 DIAGNOSIS — Z79899 Other long term (current) drug therapy: Secondary | ICD-10-CM | POA: Insufficient documentation

## 2012-09-14 DIAGNOSIS — Z8673 Personal history of transient ischemic attack (TIA), and cerebral infarction without residual deficits: Secondary | ICD-10-CM | POA: Insufficient documentation

## 2012-09-14 DIAGNOSIS — K449 Diaphragmatic hernia without obstruction or gangrene: Secondary | ICD-10-CM | POA: Insufficient documentation

## 2012-09-14 DIAGNOSIS — E119 Type 2 diabetes mellitus without complications: Secondary | ICD-10-CM | POA: Insufficient documentation

## 2012-09-14 DIAGNOSIS — I10 Essential (primary) hypertension: Secondary | ICD-10-CM | POA: Insufficient documentation

## 2012-09-14 DIAGNOSIS — F17200 Nicotine dependence, unspecified, uncomplicated: Secondary | ICD-10-CM | POA: Insufficient documentation

## 2012-09-14 DIAGNOSIS — R51 Headache: Principal | ICD-10-CM | POA: Insufficient documentation

## 2012-09-14 HISTORY — DX: Type 2 diabetes mellitus without complications: E11.9

## 2012-09-14 HISTORY — DX: Unspecified osteoarthritis, unspecified site: M19.90

## 2012-09-14 HISTORY — DX: Pneumonia, unspecified organism: J18.9

## 2012-09-14 HISTORY — DX: Personal history of other diseases of the digestive system: Z87.19

## 2012-09-14 HISTORY — DX: Migraine, unspecified, not intractable, without status migrainosus: G43.909

## 2012-09-14 LAB — CBC WITH DIFFERENTIAL/PLATELET
Basophils Absolute: 0 10*3/uL (ref 0.0–0.1)
Basophils Relative: 0 % (ref 0–1)
Eosinophils Absolute: 0 10*3/uL (ref 0.0–0.7)
Eosinophils Relative: 0 % (ref 0–5)
HCT: 43.4 % (ref 36.0–46.0)
Hemoglobin: 14.8 g/dL (ref 12.0–15.0)
Lymphocytes Relative: 44 % (ref 12–46)
Lymphs Abs: 2.1 10*3/uL (ref 0.7–4.0)
MCH: 28.1 pg (ref 26.0–34.0)
MCHC: 34.1 g/dL (ref 30.0–36.0)
MCV: 82.4 fL (ref 78.0–100.0)
Monocytes Absolute: 0.3 10*3/uL (ref 0.1–1.0)
Monocytes Relative: 6 % (ref 3–12)
Neutro Abs: 2.3 10*3/uL (ref 1.7–7.7)
Neutrophils Relative %: 49 % (ref 43–77)
Platelets: 212 10*3/uL (ref 150–400)
RBC: 5.27 MIL/uL — ABNORMAL HIGH (ref 3.87–5.11)
RDW: 12.9 % (ref 11.5–15.5)
WBC: 4.6 10*3/uL (ref 4.0–10.5)

## 2012-09-14 LAB — GLUCOSE, CAPILLARY
Glucose-Capillary: 116 mg/dL — ABNORMAL HIGH (ref 70–99)
Glucose-Capillary: 100 mg/dL — ABNORMAL HIGH (ref 70–99)

## 2012-09-14 LAB — COMPREHENSIVE METABOLIC PANEL
ALT: 11 U/L (ref 0–35)
AST: 13 U/L (ref 0–37)
Albumin: 4.4 g/dL (ref 3.5–5.2)
Alkaline Phosphatase: 67 U/L (ref 39–117)
BUN: 7 mg/dL (ref 6–23)
CO2: 28 mEq/L (ref 19–32)
Calcium: 10.2 mg/dL (ref 8.4–10.5)
Chloride: 100 mEq/L (ref 96–112)
Creatinine, Ser: 0.67 mg/dL (ref 0.50–1.10)
GFR calc Af Amer: >90 mL/min (ref >90)
GFR calc non Af Amer: >90 mL/min (ref >90)
Glucose, Bld: 182 mg/dL — ABNORMAL HIGH (ref 70–99)
Potassium: 3.6 mEq/L (ref 3.5–5.1)
Sodium: 137 mEq/L (ref 135–145)
Total Bilirubin: 0.5 mg/dL (ref 0.3–1.2)
Total Protein: 7.9 g/dL (ref 6.0–8.3)

## 2012-09-14 LAB — HEMOGLOBIN A1C
Hgb A1c MFr Bld: 8.1 % — ABNORMAL HIGH (ref <5.7)
Mean Plasma Glucose: 186 mg/dL — ABNORMAL HIGH (ref <117)

## 2012-09-14 MED ORDER — CLOPIDOGREL BISULFATE 75 MG PO TABS
75.0000 mg | ORAL_TABLET | Freq: Every day | ORAL | Status: DC
  Administered 2012-09-14 – 2012-09-15 (×2): 75 mg via ORAL
  Filled 2012-09-14 (×3): qty 1

## 2012-09-14 MED ORDER — DOCUSATE SODIUM 100 MG PO CAPS
100.0000 mg | ORAL_CAPSULE | Freq: Two times a day (BID) | ORAL | Status: DC
  Administered 2012-09-14 – 2012-09-15 (×3): 100 mg via ORAL
  Filled 2012-09-14 (×3): qty 1

## 2012-09-14 MED ORDER — POTASSIUM CHLORIDE CRYS ER 20 MEQ PO TBCR
20.0000 meq | EXTENDED_RELEASE_TABLET | Freq: Every day | ORAL | Status: DC
  Administered 2012-09-14 – 2012-09-15 (×2): 20 meq via ORAL
  Filled 2012-09-14 (×2): qty 1

## 2012-09-14 MED ORDER — HYDROCHLOROTHIAZIDE 12.5 MG PO CAPS
12.5000 mg | ORAL_CAPSULE | Freq: Every day | ORAL | Status: DC
  Administered 2012-09-14 – 2012-09-15 (×2): 12.5 mg via ORAL
  Filled 2012-09-14 (×2): qty 1

## 2012-09-14 MED ORDER — SODIUM CHLORIDE 0.9 % IV SOLN: 250.0000 mL | INTRAVENOUS | Status: DC | PRN

## 2012-09-14 MED ORDER — OLMESARTAN-AMLODIPINE-HCTZ 40-5-12.5 MG PO TABS: 1.0000 | ORAL_TABLET | Freq: Every morning | ORAL | Status: DC

## 2012-09-14 MED ORDER — TRAMADOL HCL 50 MG PO TABS: 50.0000 mg | ORAL_TABLET | Freq: Four times a day (QID) | ORAL | Status: DC | PRN

## 2012-09-14 MED ORDER — ONDANSETRON HCL 4 MG PO TABS
4.0000 mg | ORAL_TABLET | Freq: Four times a day (QID) | ORAL | Status: DC | PRN
  Administered 2012-09-14: 4 mg via ORAL
  Filled 2012-09-14: qty 1

## 2012-09-14 MED ORDER — SODIUM CHLORIDE 0.9 % IJ SOLN
3.0000 mL | Freq: Two times a day (BID) | INTRAMUSCULAR | Status: DC
  Administered 2012-09-14 – 2012-09-15 (×2): 3 mL via INTRAVENOUS

## 2012-09-14 MED ORDER — INSULIN GLARGINE 100 UNIT/ML ~~LOC~~ SOLN
10.0000 [IU] | Freq: Every day | SUBCUTANEOUS | Status: DC
  Administered 2012-09-14: 10 [IU] via SUBCUTANEOUS
  Filled 2012-09-14 (×2): qty 0.1

## 2012-09-14 MED ORDER — ALUM & MAG HYDROXIDE-SIMETH 200-200-20 MG/5ML PO SUSP: 30.0000 mL | Freq: Four times a day (QID) | ORAL | Status: DC | PRN

## 2012-09-14 MED ORDER — ATORVASTATIN CALCIUM 20 MG PO TABS
20.0000 mg | ORAL_TABLET | Freq: Every day | ORAL | Status: DC
  Administered 2012-09-14 – 2012-09-15 (×2): 20 mg via ORAL
  Filled 2012-09-14 (×2): qty 1

## 2012-09-14 MED ORDER — LINAGLIPTIN 5 MG PO TABS
5.0000 mg | ORAL_TABLET | Freq: Every day | ORAL | Status: DC
  Administered 2012-09-14 – 2012-09-15 (×2): 5 mg via ORAL
  Filled 2012-09-14 (×2): qty 1

## 2012-09-14 MED ORDER — FLUTICASONE PROPIONATE 50 MCG/ACT NA SUSP
1.0000 | Freq: Every day | NASAL | Status: DC
  Administered 2012-09-15: 1 via NASAL
  Filled 2012-09-14 (×2): qty 16

## 2012-09-14 MED ORDER — SODIUM CHLORIDE 0.9 % IJ SOLN
3.0000 mL | Freq: Two times a day (BID) | INTRAMUSCULAR | Status: DC
  Administered 2012-09-14 (×2): 3 mL via INTRAVENOUS

## 2012-09-14 MED ORDER — DOXYCYCLINE HYCLATE 100 MG PO TABS: 100.0000 mg | ORAL_TABLET | Freq: Two times a day (BID) | ORAL | Status: DC

## 2012-09-14 MED ORDER — IRBESARTAN 300 MG PO TABS
300.0000 mg | ORAL_TABLET | Freq: Every day | ORAL | Status: DC
  Administered 2012-09-14 – 2012-09-15 (×2): 300 mg via ORAL
  Filled 2012-09-14 (×2): qty 1

## 2012-09-14 MED ORDER — CLONIDINE HCL 0.1 MG PO TABS
0.1000 mg | ORAL_TABLET | Freq: Three times a day (TID) | ORAL | Status: DC
  Administered 2012-09-14 – 2012-09-15 (×3): 0.1 mg via ORAL
  Filled 2012-09-14 (×5): qty 1

## 2012-09-14 MED ORDER — ASPIRIN EC 81 MG PO TBEC
81.0000 mg | DELAYED_RELEASE_TABLET | Freq: Every day | ORAL | Status: DC
  Administered 2012-09-14 – 2012-09-15 (×2): 81 mg via ORAL
  Filled 2012-09-14 (×2): qty 1

## 2012-09-14 MED ORDER — ONDANSETRON HCL 4 MG/2ML IJ SOLN: 4.0000 mg | Freq: Four times a day (QID) | INTRAMUSCULAR | Status: DC | PRN

## 2012-09-14 MED ORDER — GLIMEPIRIDE 4 MG PO TABS
4.0000 mg | ORAL_TABLET | Freq: Every day | ORAL | Status: DC
  Administered 2012-09-14 – 2012-09-15 (×2): 4 mg via ORAL
  Filled 2012-09-14 (×2): qty 1

## 2012-09-14 MED ORDER — HEPARIN SODIUM (PORCINE) 5000 UNIT/ML IJ SOLN
5000.0000 [IU] | Freq: Three times a day (TID) | INTRAMUSCULAR | Status: DC
  Administered 2012-09-14 – 2012-09-15 (×4): 5000 [IU] via SUBCUTANEOUS
  Filled 2012-09-14 (×6): qty 1

## 2012-09-14 MED ORDER — ALPRAZOLAM 0.5 MG PO TABS
0.5000 mg | ORAL_TABLET | Freq: Every day | ORAL | Status: DC
  Administered 2012-09-14: 0.5 mg via ORAL
  Filled 2012-09-14: qty 1

## 2012-09-14 MED ORDER — PROMETHAZINE HCL 25 MG PO TABS: 25.0000 mg | ORAL_TABLET | Freq: Four times a day (QID) | ORAL | Status: DC | PRN

## 2012-09-14 MED ORDER — PANTOPRAZOLE SODIUM 40 MG PO TBEC
40.0000 mg | DELAYED_RELEASE_TABLET | Freq: Every day | ORAL | Status: DC
  Administered 2012-09-14 – 2012-09-15 (×2): 40 mg via ORAL
  Filled 2012-09-14 (×2): qty 1

## 2012-09-14 MED ORDER — ZOLPIDEM TARTRATE 5 MG PO TABS: 5.0000 mg | ORAL_TABLET | Freq: Every evening | ORAL | Status: DC | PRN

## 2012-09-14 MED ORDER — OXYCODONE HCL 5 MG PO TABS
5.0000 mg | ORAL_TABLET | ORAL | Status: DC | PRN
  Administered 2012-09-14 – 2012-09-15 (×3): 5 mg via ORAL
  Filled 2012-09-14 (×3): qty 1

## 2012-09-14 MED ORDER — INSULIN ASPART 100 UNIT/ML ~~LOC~~ SOLN
0.0000 [IU] | Freq: Three times a day (TID) | SUBCUTANEOUS | Status: DC
  Administered 2012-09-15: 2 [IU] via SUBCUTANEOUS
  Administered 2012-09-15: 1 [IU] via SUBCUTANEOUS

## 2012-09-14 MED ORDER — NITROGLYCERIN 0.4 MG SL SUBL: 0.4000 mg | SUBLINGUAL_TABLET | SUBLINGUAL | Status: DC | PRN

## 2012-09-14 MED ORDER — SODIUM CHLORIDE 0.9 % IJ SOLN: 3.0000 mL | INTRAMUSCULAR | Status: DC | PRN

## 2012-09-14 NOTE — H&P (Signed)
[name] is an 57 y.o. female.   Chief Complaint: Severe headache HPI: 57 years old female with 2 day history of worsening headache, elevated blood pressure and blood sugars.  Past Medical History  Diagnosis Date  . Diabetes mellitus   . Hypertension   . Anxiety   . Hyperlipidemia   . GERD (gastroesophageal reflux disease)   . Irritable bowel syndrome   . Chronic headaches   . Sinus problem   . Hidradenitis     groin  . Anemia   . Schatzki's ring   . Diverticulosis of colon with hemorrhage April 2013  . Stomach ulcer 1972    non-bleeding  . Chronic asthmatic bronchitis   . Obesity   . Heart attack     2003 mild MI, March 2013 mild MI  . Stroke 2009, 2010, 2011    total 3 strokes  . Left-sided weakness     since stroke, left eye trouble seeing  . Cancer 1985    ovarian, no treatment except surgery  . H/O blood transfusion reaction 1985, April 2013      Past Surgical History  Procedure Laterality Date  . Hiatal hernia repair    . Breast reduction surgery    . Axillary hidradenitis excision  1990-2008    bilateral  . Excision hidradenitis groin and abdomen  2012  . Cervical disc surgery    . Colonoscopy  08/12/2011    Procedure: COLONOSCOPY;  Surgeon: Meryl Dare, MD,FACG;  Location: [facility] ENDOSCOPY;  Service: Endoscopy;  Laterality: N/A;  . Esophagogastroduodenoscopy  08/12/2011    Procedure: ESOPHAGOGASTRODUODENOSCOPY (EGD);  Surgeon: Meryl Dare, MD,FACG;  Location: [facility] ENDOSCOPY;  Service: Endoscopy;  Laterality: N/A;  . Givens capsule study  08/13/2011    Procedure: GIVENS CAPSULE STUDY;  Surgeon: Meryl Dare, MD,FACG;  Location: [facility] ENDOSCOPY;  Service: Endoscopy;  Laterality: N/A;  . Nissen fundoplication  1996  . Nasal sinus surgery    . Foot surgery      bi-lateral  . Colonoscopy  06/19/2006  . Esophagogastroduodenoscopy  06/04/2005  . Cardiac catheterization  2004    mild disease  . Back surgery  2008    lower back x2   . Breast surgery  2007,  2008    right cyst removed, reduction  . Abdominal hysterectomy  1985  . Cholecystectomy  1990's  . Hydradenitis excision  04/01/2012    Procedure: EXCISION HYDRADENITIS GROIN;  Surgeon: Valarie Merino, MD;  Location: [facility] ORS;  Service: General;  Laterality: Bilateral;  Excision of Hydradenitis of Perineum    Family History  Problem Relation Age of Onset  . Breast cancer Mother   . Heart disease Mother   . Hypertension Mother   . Diabetes Father   . Heart disease Father   . Stroke Father   . Heart disease Sister   . Hypertension Sister   . Hypertension Sister   . Hyperlipidemia Sister   . Diabetes Sister   . Emphysema      great uncle  . Aneurysm Sister     brain   Social History:  reports that she has been smoking Cigarettes.  She has a 9 pack-year smoking history. She has never used smokeless tobacco. She reports that she does not drink alcohol or use illicit drugs.  Allergies:  Allergies  Allergen Reactions  . Bactrim (Sulfamethoxazole W-Trimethoprim) Shortness Of Breath  . Sulfa Antibiotics Shortness Of Breath and Palpitations  . Iodine Swelling  . Metformin And Related  Diarrhea  . Adhesive (Tape) Rash  . Shellfish Allergy Rash    Medications Prior to Admission  Medication Sig Dispense Refill  . ALPRAZolam (XANAX) 0.5 MG tablet Take 0.5 mg by mouth at bedtime as needed. For anxiety      . clopidogrel (PLAVIX) 75 MG tablet Take 75 mg by mouth daily.      Marland Kitchen doxycycline (VIBRA-TABS) 100 MG tablet Take 100 mg by mouth 2 (two) times daily.      Marland Kitchen esomeprazole (NEXIUM) 40 MG capsule Take 1 capsule (40 mg total) by mouth daily before breakfast.  15 capsule  0  . glimepiride (AMARYL) 4 MG tablet Take 4 mg by mouth at bedtime.      . mometasone (NASONEX) 50 MCG/ACT nasal spray Place 1 spray into the nose daily as needed. For allergies      . neomycin-bacitracin-polymyxin (NEOSPORIN) OINT Apply 1 application topically 2 (two) times daily.      . nitroGLYCERIN (NITRODUR -  DOSED IN MG/24 HR) 0.4 mg/hr Place 1 patch onto the skin as needed. Chest pains      . nitroGLYCERIN (NITROSTAT) 0.4 MG SL tablet Place 0.4 mg under the tongue every 5 (five) minutes as needed. Chest pains      . Olmesartan-Amlodipine-HCTZ (TRIBENZOR) 40-5-12.5 MG TABS Take 1 tablet by mouth every morning.       . Potassium Chloride Crys CR (KLOR-CON M20 PO) Take 1 capsule by mouth daily.       . promethazine (PHENERGAN) 25 MG tablet Take 1 tablet (25 mg total) by mouth every 6 (six) hours as needed for nausea.  30 tablet  0  . rosuvastatin (CRESTOR) 20 MG tablet Take 20 mg by mouth every morning.       . saxagliptin HCl (ONGLYZA) 5 MG TABS tablet Take 5 mg by mouth daily.      . traMADol (ULTRAM) 50 MG tablet Take 50 mg by mouth every 6 (six) hours as needed. FOR PAIN      . zolpidem (AMBIEN) 10 MG tablet Take 5 mg by mouth at bedtime as needed. FOR SLEEP        No results found for this or any previous visit (from the past 48 hour(s)). No results found.  @ROS @ Constitutional: Negative for fever, chills and weight loss.  HENT: Negative for neck pain.  Eyes: Negative for blurred vision, double vision and photophobia.  Respiratory: Negative for cough, hemoptysis, sputum production and shortness of breath.  Cardiovascular: Negative for chest pain, palpitations, orthopnea, claudication and leg swelling.  Gastrointestinal: Positive for blood in stool. Negative for vomiting, abdominal pain and diarrhea.  Genitourinary: Negative for dysuria and urgency.  Musculoskeletal: Negative for back pain.  Skin: Negative for rash.  Neurological: Positive for headaches. Negative for dizziness.   Blood pressure 157/107, pulse 106, temperature 97.1 F (36.2 C), temperature source Oral, height 5\' 3"  (1.6 m), weight 61.236 kg (135 lb), SpO2 97.00%.  Constitutional: She is oriented to person, place, and time. She appears well-developed.  HENT: Head: Normocephalic and atraumatic.  Eyes: Brown eyes,  conjunctivae and EOM are normal. Pupils are equal, round, and reactive to light, no discharge. No scleral icterus.  Neck: Neck supple. No JVD present. No tracheal deviation present. No thyromegaly present.  Cardiovascular: Normal rate, regular rhythm and normal heart sounds. Exam reveals no gallop.  Respiratory: Breath sounds normal. No respiratory distress. She has no wheezes. She has no rales. She exhibits no tenderness.  GI: Bowel sounds are normal. She  exhibits no distension. There is epigastric tenderness. There is no rebound and no guarding.  Musculoskeletal: She exhibits no edema and no tenderness.  Lymphadenopathy:  She has no cervical adenopathy.  Neurological: She is alert and oriented to person, place, and time.  Skin:-Warm and dry.  Assessment/Plan Severe headache Hypertension, uncontrolled Non-insulin-dependent diabetes mellitus, uncontrolled History of irritable bowel syndrome  History of CVA  History of questionable silent MI in the past  Tobacco abuse  Hiatal hernia  History of remote cocaine abuse  Hypercholesteremia  Add clonidine, hold NTG patch. Add basal insulin MRI/MRA head.  Deylan Canterbury S 09/14/2012, 1:57 PM

## 2012-09-15 ENCOUNTER — Observation Stay (HOSPITAL_COMMUNITY): Payer: Medicare Other

## 2012-09-15 LAB — CBC
HCT: 42.2 % (ref 36.0–46.0)
Hemoglobin: 14.1 g/dL (ref 12.0–15.0)
MCH: 27.9 pg (ref 26.0–34.0)
MCHC: 33.4 g/dL (ref 30.0–36.0)
MCV: 83.6 fL (ref 78.0–100.0)
Platelets: 179 10*3/uL (ref 150–400)
RBC: 5.05 MIL/uL (ref 3.87–5.11)
RDW: 13.2 % (ref 11.5–15.5)
WBC: 4.9 10*3/uL (ref 4.0–10.5)

## 2012-09-15 LAB — BASIC METABOLIC PANEL
BUN: 13 mg/dL (ref 6–23)
CO2: 27 mEq/L (ref 19–32)
Calcium: 9.7 mg/dL (ref 8.4–10.5)
Chloride: 100 mEq/L (ref 96–112)
Creatinine, Ser: 0.74 mg/dL (ref 0.50–1.10)
GFR calc Af Amer: >90 mL/min (ref >90)
GFR calc non Af Amer: >90 mL/min (ref >90)
Glucose, Bld: 165 mg/dL — ABNORMAL HIGH (ref 70–99)
Potassium: 4 mEq/L (ref 3.5–5.1)
Sodium: 138 mEq/L (ref 135–145)

## 2012-09-15 LAB — GLUCOSE, CAPILLARY
Glucose-Capillary: 169 mg/dL — ABNORMAL HIGH (ref 70–99)
Glucose-Capillary: 119 mg/dL — ABNORMAL HIGH (ref 70–99)

## 2012-09-15 MED ORDER — INSULIN GLARGINE 100 UNIT/ML ~~LOC~~ SOLN: 10.0000 [IU] | Freq: Every day | SUBCUTANEOUS | Status: DC

## 2012-09-15 NOTE — Progress Notes (Signed)
Assessment unchanged. Discussed D/C instructions with pt including f/u appointments and teaching for administering insulin. Verbalized understanding. IV and tele removed. Pt left via foot accompanied by RN.

## 2012-09-15 NOTE — Progress Notes (Signed)
  Echocardiogram 2D Echocardiogram has been performed.  [name] 09/15/2012, 10:30 AM

## 2012-09-15 NOTE — Progress Notes (Signed)
Nutrition Brief Note  Patient identified on the Malnutrition Screening Tool (MST) Report for recent weight lost without trying.  Per weight records below, patient's weight has been stable.  Wt Readings from Last 10 Encounters:  09/15/12 137 lb 5.6 oz (62.3 kg)  04/19/12 140 lb (63.504 kg)  04/17/12 143 lb 10.4 oz (65.159 kg)  04/01/12 139 lb (63.05 kg)  04/01/12 139 lb (63.05 kg)  03/25/12 142 lb (64.411 kg)  03/11/12 142 lb (64.411 kg)  02/19/12 137 lb (62.143 kg)  01/24/12 139 lb 9.6 oz (63.322 kg)  10/25/11 140 lb (63.504 kg)    Body mass index is 24.34 kg/(m^2). Patient meets criteria for Normal based on current BMI.   Current diet order is Heart Healthy, patient is consuming approximately 100% of meals at this time. Labs and medications reviewed.   No nutrition interventions warranted at this time. If nutrition issues arise, please consult RD.   Maureen Chatters, RD, LDN Pager #: [phone] After-Hours Pager #: [phone]

## 2012-09-15 NOTE — Discharge Summary (Signed)
Physician Discharge Summary  Patient ID: [name] MRN: [id number] DOB/AGE: 1955/07/15 57 y.o.  Admit date: 09/14/2012 Discharge date: 09/15/2012  Admission Diagnoses: Severe headache  Hypertension, uncontrolled  Non-insulin-dependent diabetes mellitus, uncontrolled  History of irritable bowel syndrome  History of CVA  Tobacco abuse  Hiatal hernia  History of remote cocaine abuse  Hypercholesteremia  Discharge Diagnoses:  Principle Problems: * Severe headache* Hypertension, improved control Non-insulin-dependent diabetes mellitus, improved control History of irritable bowel syndrome  History of CVA  Tobacco abuse  Hiatal hernia  History of remote cocaine abuse  Hypercholesteremia  Discharged Condition: good  Hospital Course: 57 years old female with 2 day history of worsening headache, elevated blood pressure and blood sugars. Her MRI was without evidence of stroke. Her blood pressure and sugar control improved woth medication adjustment. She was discharged home in stable condition.   Consults: None  Significant Diagnostic Studies: labs: Elevated Hgb A 1 C of 8.1. Normal CBC and BMET except glucose of 169 mg/dl.  Treatments: analgesia: Oxycodone, cardiac meds: Olmesartan, Potassium, HCTZ, Plavix and Crestor and insulin: Lantus  Discharge Exam: Blood pressure 99/78, pulse 57, temperature 97.8 F (36.6 C), temperature source Oral, resp. rate 16, height 5\' 3"  (1.6 m), weight 62.3 kg (137 lb 5.6 oz), SpO2 91.00%. HENT: Head: Normocephalic and atraumatic.  Eyes: Brown eyes, conjunctivae and EOM are normal. Pupils are equal, round, and reactive to light, no discharge. No scleral icterus.  Neck: Neck supple. No JVD present. No tracheal deviation present. No thyromegaly present.  Cardiovascular: Normal rate, regular rhythm and normal heart sounds. Exam reveals no gallop.  Respiratory: Breath sounds normal. No respiratory distress. She has no wheezes. She has no rales. She  exhibits no tenderness.  GI: Bowel sounds are normal. She exhibits no distension. There is epigastric tenderness. There is no rebound and no guarding.  Musculoskeletal: She exhibits no edema and no tenderness.  Lymphadenopathy: Right hip tenderness. She has no cervical adenopathy.  Neurological: She is alert and oriented to person, place, and time.  Skin:-Warm and dry.   Disposition: 01-Home or Self Care   Future Appointments Provider Department Dept Phone   09/22/2012 1:30 PM Iva Boop, MD [facility] [phone]       Medication List    TAKE these medications       acetaminophen 500 MG tablet  Commonly known as:  TYLENOL  Take 500 mg by mouth every 6 (six) hours as needed for pain.     albuterol 108 (90 BASE) MCG/ACT inhaler  Commonly known as:  PROVENTIL HFA;VENTOLIN HFA  Inhale 2 puffs into the lungs every 6 (six) hours as needed for wheezing.     ALPRAZolam 0.5 MG tablet  Commonly known as:  XANAX  Take 0.5 mg by mouth at bedtime as needed. For anxiety     clopidogrel 75 MG tablet  Commonly known as:  PLAVIX  Take 75 mg by mouth daily.     diphenhydrAMINE 25 MG tablet  Commonly known as:  BENADRYL  Take 25 mg by mouth every 6 (six) hours as needed for itching. For itching or taking certain medications that make her itch.     esomeprazole 40 MG capsule  Commonly known as:  NEXIUM  Take 40 mg by mouth daily before breakfast.     glimepiride 4 MG tablet  Commonly known as:  AMARYL  Take 4 mg by mouth at bedtime.     insulin glargine 100 UNIT/ML injection  Commonly known as:  LANTUS  Inject 0.1 mLs (10 Units total) into the skin at bedtime.     loratadine 10 MG tablet  Commonly known as:  CLARITIN  Take 10 mg by mouth daily as needed for allergies.     mometasone 50 MCG/ACT nasal spray  Commonly known as:  NASONEX  Place 1 spray into the nose daily as needed. For allergies     nitroGLYCERIN 0.4 mg/hr  Commonly known as:   NITRODUR - Dosed in mg/24 hr  Place 1 patch onto the skin as needed. Chest pains     nitroGLYCERIN 0.4 MG SL tablet  Commonly known as:  NITROSTAT  Place 0.4 mg under the tongue every 5 (five) minutes as needed. Chest pains     potassium chloride SA 20 MEQ tablet  Commonly known as:  K-DUR,KLOR-CON  Take 20 mEq by mouth daily as needed (for cramps).     promethazine 25 MG tablet  Commonly known as:  PHENERGAN  Take 25 mg by mouth every 6 (six) hours as needed for nausea.     rosuvastatin 20 MG tablet  Commonly known as:  CRESTOR  Take 20 mg by mouth every morning.     saxagliptin HCl 5 MG Tabs tablet  Commonly known as:  ONGLYZA  Take 5 mg by mouth daily.     traMADol 50 MG tablet  Commonly known as:  ULTRAM  Take 100 mg by mouth every 6 (six) hours as needed for pain.     TRIBENZOR 40-5-12.5 MG Tabs  Generic drug:  Olmesartan-Amlodipine-HCTZ  Take 1 tablet by mouth every morning.     zolpidem 10 MG tablet  Commonly known as:  AMBIEN  Take 5 mg by mouth at bedtime as needed. FOR SLEEP           Follow-up Information   Follow up with [facility] S, MD. Schedule an appointment as soon as possible for a visit in 1 week.   Contact information:   [address] [place] [address] [phone]       Signed: Ricki Rodriguez 09/15/2012, 5:56 PM

## 2012-09-15 NOTE — Progress Notes (Signed)
Utilization review completed. Romano Stigger, RN, BSN. 

## 2012-09-22 ENCOUNTER — Other Ambulatory Visit (INDEPENDENT_AMBULATORY_CARE_PROVIDER_SITE_OTHER): Payer: Medicare Other

## 2012-09-22 ENCOUNTER — Ambulatory Visit (INDEPENDENT_AMBULATORY_CARE_PROVIDER_SITE_OTHER): Payer: Medicare Other | Admitting: Internal Medicine

## 2012-09-22 ENCOUNTER — Encounter: Payer: Self-pay | Admitting: Internal Medicine

## 2012-09-22 VITALS — BP 128/80 | HR 80 | Ht 63.5 in | Wt 137.4 lb

## 2012-09-22 DIAGNOSIS — M7989 Other specified soft tissue disorders: Secondary | ICD-10-CM

## 2012-09-22 DIAGNOSIS — K589 Irritable bowel syndrome without diarrhea: Secondary | ICD-10-CM

## 2012-09-22 DIAGNOSIS — R1013 Epigastric pain: Secondary | ICD-10-CM

## 2012-09-22 LAB — LIPASE: Lipase: 29 U/L (ref 11.0–59.0)

## 2012-09-22 LAB — AMYLASE: Amylase: 52 U/L (ref 27–131)

## 2012-09-22 NOTE — Patient Instructions (Addendum)
Your physician has requested that you go to the basement for the following lab work before leaving today: Amylase , Lipase  We will call you with results and plans.  Start psyllium which is over the counter.  Take one teaspoon a day and work your way up to 1 tablespoon a day over a week or so.  I appreciate the opportunity to care for you.    Dr. Iva Boop, MD

## 2012-09-22 NOTE — Progress Notes (Signed)
Subjective:    Patient ID: [name], female    DOB: [birth year], 57 y.o.   MRN: [id number]  HPI Still has alternating bowel habits. Is also having intermittent epigastric pain. She is most concerned about flattening of right lateral hip/thigh and nodules felt there - says began after hospitalization for GI bleed last year No hx injections in right thigh she says  Allergies  Allergen Reactions  . Bactrim (Sulfamethoxazole W-Trimethoprim) Shortness Of Breath  . Sulfa Antibiotics Shortness Of Breath and Palpitations  . Iodine Swelling  . Metformin And Related Diarrhea  . Adhesive (Tape) Rash  . Shellfish Allergy Rash   Outpatient Prescriptions Prior to Visit  Medication Sig Dispense Refill  . acetaminophen (TYLENOL) 500 MG tablet Take 500 mg by mouth every 6 (six) hours as needed for pain.      Marland Kitchen albuterol (PROVENTIL HFA;VENTOLIN HFA) 108 (90 BASE) MCG/ACT inhaler Inhale 2 puffs into the lungs every 6 (six) hours as needed for wheezing.      Marland Kitchen ALPRAZolam (XANAX) 0.5 MG tablet Take 0.5 mg by mouth at bedtime as needed. For anxiety      . clopidogrel (PLAVIX) 75 MG tablet Take 75 mg by mouth daily.      . diphenhydrAMINE (BENADRYL) 25 MG tablet Take 25 mg by mouth every 6 (six) hours as needed for itching. For itching or taking certain medications that make her itch.      . esomeprazole (NEXIUM) 40 MG capsule Take 40 mg by mouth daily before breakfast.      . glimepiride (AMARYL) 4 MG tablet Take 4 mg by mouth at bedtime.      Marland Kitchen loratadine (CLARITIN) 10 MG tablet Take 10 mg by mouth daily as needed for allergies.      . mometasone (NASONEX) 50 MCG/ACT nasal spray Place 1 spray into the nose daily as needed. For allergies      . nitroGLYCERIN (NITRODUR - DOSED IN MG/24 HR) 0.4 mg/hr Place 1 patch onto the skin as needed. Chest pains      . nitroGLYCERIN (NITROSTAT) 0.4 MG SL tablet Place 0.4 mg under the tongue every 5 (five) minutes as needed. Chest pains      .  Olmesartan-Amlodipine-HCTZ (TRIBENZOR) 40-5-12.5 MG TABS Take 1 tablet by mouth every morning.       . potassium chloride SA (K-DUR,KLOR-CON) 20 MEQ tablet Take 20 mEq by mouth daily as needed (for cramps).      . promethazine (PHENERGAN) 25 MG tablet Take 25 mg by mouth every 6 (six) hours as needed for nausea.      . rosuvastatin (CRESTOR) 20 MG tablet Take 20 mg by mouth every morning.       . saxagliptin HCl (ONGLYZA) 5 MG TABS tablet Take 5 mg by mouth daily.      . traMADol (ULTRAM) 50 MG tablet Take 100 mg by mouth every 6 (six) hours as needed for pain.      Marland Kitchen zolpidem (AMBIEN) 10 MG tablet Take 5 mg by mouth at bedtime as needed. FOR SLEEP      . insulin glargine (LANTUS) 100 UNIT/ML injection Inject 0.1 mLs (10 Units total) into the skin at bedtime.  10 mL  1   No facility-administered medications prior to visit.   Past Medical History  Diagnosis Date  . Hypertension   . Anxiety   . Hyperlipidemia   . GERD (gastroesophageal reflux disease)   . Irritable bowel syndrome   . Sinus problem   .  Hidradenitis     groin  . Anemia   . Schatzki's ring   . Diverticulosis of colon with hemorrhage April 2013  . Stomach ulcer 1972    non-bleeding  . Chronic asthmatic bronchitis   . Obesity   . Heart attack     2003 mild MI, March 2013 mild MI  . Left-sided weakness     since stroke, left eye trouble seeing  . Cancer 1985    ovarian, no treatment except surgery  . H/O blood transfusion reaction 1985, April 2013  . Anginal pain   . Pneumonia ~ 2011  . Chronic bronchitis     "I get it q yr" (09/14/2012)  . Type II diabetes mellitus   . H/O hiatal hernia   . Chronic headaches     "daily lately; sometimes monthly" (09/14/2012)  . Migraines   . Stroke 2009, 2010, 2011    total 3 strokes  . Arthritis     "neck, left hand" (09/14/2012)  . Depression    Past Surgical History  Procedure Laterality Date  . Hiatal hernia repair    . Breast reduction surgery    . Axillary  hidradenitis excision  1990-2008    bilateral  . Hydradenitis excision  01/2011; 03/2012    'groin and abdomen; 03/2012" (09/14/2012)  . Anterior cervical decomp/discectomy fusion  2002  . Colonoscopy  08/12/2011    Procedure: COLONOSCOPY;  Surgeon: Meryl Dare, MD,FACG;  Location: [facility] ENDOSCOPY;  Service: Endoscopy;  Laterality: N/A;  . Esophagogastroduodenoscopy  08/12/2011    Procedure: ESOPHAGOGASTRODUODENOSCOPY (EGD);  Surgeon: Meryl Dare, MD,FACG;  Location: [facility] ENDOSCOPY;  Service: Endoscopy;  Laterality: N/A;  . Givens capsule study  08/13/2011    Procedure: GIVENS CAPSULE STUDY;  Surgeon: Meryl Dare, MD,FACG;  Location: [facility] ENDOSCOPY;  Service: Endoscopy;  Laterality: N/A;  . Nissen fundoplication  1996  . Uvulopalatopharyngoplasty, tonsillectomy and septoplasty  2000's  . Hammer toe surgery Bilateral ~ 2000  . Colonoscopy  06/19/2006  . Esophagogastroduodenoscopy  06/04/2005  . Cardiac catheterization  2004    mild disease  . Back surgery    . Abdominal hysterectomy  1985  . Cholecystectomy  1990's  . Hydradenitis excision  04/01/2012    Procedure: EXCISION HYDRADENITIS GROIN;  Surgeon: Valarie Merino, MD;  Location: [facility] ORS;  Service: General;  Laterality: Bilateral;  Excision of Hydradenitis of Perineum  . Tonsillectomy and adenoidectomy  1959  . Appendectomy  1985  . Breast cyst excision Right 2008  . Reduction mammaplasty  1996?  Marland Kitchen Breast biopsy Right 2007  . Posterior lumbar fusion  2008 X 2   Review of Systems + anxiety    Objective:   Physical Exam General:  NAD Eyes:   anicteric Abdomen:  soft and and mildly tender diffusely, benign Ext:   no edema in LE's, right lateral thigh has subcutaneous nodules that are slightly tender, not seen/palpated on left    Data Reviewed:   Recent hospitalization Right hip xray and images    Assessment & Plan:  IBS (irritable bowel syndrome)  Abdominal pain, epigastric  Soft tissue calcification - right lateral  thigh/hip  1. Fiber supplementation for IBS 2. Lipase, amylase to evaluate epigastric pain on Onglyza 3. F/U PCP re: sof tissue Ca++  Cc: Donia Guiles, MD  Lab Results  Component Value Date   LIPASE 29.0 09/22/2012   Lab Results  Component Value Date   AMYLASE 52 09/22/2012    She does not have pancreatitis  based upon these results. Onglyza can cause epigastric pain - background of IBS makes it difficult to sort out her sxs. I am going to recommend she hold Onglyza for 1 week to see if she feels better and to call back and let me know.

## 2012-09-24 NOTE — Progress Notes (Signed)
Quick Note:  Pancreas is ok She needs to hold Onglyza x 1 week and see if that relieves her pain - she is to call back with an update ______

## 2012-12-11 ENCOUNTER — Telehealth: Payer: Self-pay | Admitting: Internal Medicine

## 2012-12-11 NOTE — Telephone Encounter (Signed)
Patient states she has been constipated and bloated. She takes Metamucil and last night used an enema. Report some vomiting last night also. Suggested she try Miralax now and repeat tonight if no bowel movement. Scheduled OV on 12/14/12 at 9:00 AM with Doug Sou. PA.

## 2012-12-14 ENCOUNTER — Other Ambulatory Visit (INDEPENDENT_AMBULATORY_CARE_PROVIDER_SITE_OTHER): Payer: Medicare Other

## 2012-12-14 ENCOUNTER — Encounter: Payer: Self-pay | Admitting: Gastroenterology

## 2012-12-14 ENCOUNTER — Ambulatory Visit (INDEPENDENT_AMBULATORY_CARE_PROVIDER_SITE_OTHER): Payer: Medicare Other | Admitting: Gastroenterology

## 2012-12-14 VITALS — BP 146/90 | HR 97 | Ht 64.0 in | Wt 135.0 lb

## 2012-12-14 DIAGNOSIS — R1013 Epigastric pain: Secondary | ICD-10-CM

## 2012-12-14 DIAGNOSIS — Z8719 Personal history of other diseases of the digestive system: Secondary | ICD-10-CM

## 2012-12-14 LAB — COMPREHENSIVE METABOLIC PANEL
ALT: 12 U/L (ref 0–35)
AST: 14 U/L (ref 0–37)
Albumin: 4 g/dL (ref 3.5–5.2)
Alkaline Phosphatase: 59 U/L (ref 39–117)
BUN: 8 mg/dL (ref 6–23)
CO2: 30 mEq/L (ref 19–32)
Calcium: 9.8 mg/dL (ref 8.4–10.5)
Chloride: 104 mEq/L (ref 96–112)
Creatinine, Ser: 0.7 mg/dL (ref 0.4–1.2)
GFR: 112.42 mL/min (ref >60.00)
Glucose, Bld: 176 mg/dL — ABNORMAL HIGH (ref 70–99)
Potassium: 4.3 mEq/L (ref 3.5–5.1)
Sodium: 140 mEq/L (ref 135–145)
Total Bilirubin: 1 mg/dL (ref 0.3–1.2)
Total Protein: 7.8 g/dL (ref 6.0–8.3)

## 2012-12-14 LAB — AMYLASE: Amylase: 78 U/L (ref 27–131)

## 2012-12-14 LAB — CBC WITH DIFFERENTIAL/PLATELET
Basophils Absolute: 0 10*3/uL (ref 0.0–0.1)
Basophils Relative: 0.4 % (ref 0.0–3.0)
Eosinophils Absolute: 0.1 10*3/uL (ref 0.0–0.7)
Eosinophils Relative: 1.4 % (ref 0.0–5.0)
HCT: 42 % (ref 36.0–46.0)
Hemoglobin: 13.7 g/dL (ref 12.0–15.0)
Lymphocytes Relative: 26.9 % (ref 12.0–46.0)
Lymphs Abs: 1.7 10*3/uL (ref 0.7–4.0)
MCHC: 32.7 g/dL (ref 30.0–36.0)
MCV: 84.7 fl (ref 78.0–100.0)
Monocytes Absolute: 0.5 10*3/uL (ref 0.1–1.0)
Monocytes Relative: 7.6 % (ref 3.0–12.0)
Neutro Abs: 3.9 10*3/uL (ref 1.4–7.7)
Neutrophils Relative %: 63.7 % (ref 43.0–77.0)
Platelets: 198 10*3/uL (ref 150.0–400.0)
RBC: 4.96 Mil/uL (ref 3.87–5.11)
RDW: 13.4 % (ref 11.5–14.6)
WBC: 6.2 10*3/uL (ref 4.5–10.5)

## 2012-12-14 LAB — LIPASE: Lipase: 74 U/L — ABNORMAL HIGH (ref 11.0–59.0)

## 2012-12-14 NOTE — Progress Notes (Signed)
12/14/2012 [name] [id number] [birth year]   History of Present Illness:  Patient is a 57 year old female who is a patient of Dr. Marvell Fuller.  She has questionable history of pancreatitis with dilated biliary tree and PD in 2011.  Had cholecystectomy.  Comes in today complaining of excruciating epigastric abdominal pain that began this past Tuesday, almost one week ago.  Says that it is 7/10 in intensity.  Had nausea and vomiting as well but that has improved.  She also has a lot of bloating in upper abdomen as well.  Had been moving her bowels, but tried using an enema to see if it would help, but it did not.  Says that she has not been eating much other than soup.  She was complaining of some epigastric pain at her visit with Dr. Leone Payor in May of this year at which time the amylase and lipase were normal.  Says that the pain is much worse this time.  Current Medications, Allergies, Past Medical History, Past Surgical History, Family History and Social History were reviewed in [employer] record.   Physical Exam: BP 146/90  Pulse 97  Ht 5\' 4"  (1.626 m)  Wt 135 lb (61.236 kg)  BMI 23.16 kg/m2 General: Well developed, black female in no acute distress Head: Normocephalic and atraumatic Eyes:  Sclerae anicteric, conjunctiva pink  Ears: Normal auditory acuity Lungs: Clear throughout to auscultation Heart: Regular rate and rhythm Abdomen: Soft, minimally distended in upper abdomen.  Multiple scars noted from previous surgeries.  No masses or hernias noted.  Moderate TTP in upper abdomen without R/R/G. Musculoskeletal: Symmetrical with no gross deformities  Extremities: No edema  Neurological: Alert oriented x 4, grossly nonfocal Psychological:  Alert and cooperative. Normal mood and affect  Assessment and Recommendations: -Epigastric abdominal pain with bloating, nausea and vomiting with questionable history of pancreatitis and dilation of biliary tree/PD.  Will  check amylase, lipase, CBC, and CMP.  Continue phenergan for nausea if needed.

## 2012-12-14 NOTE — Progress Notes (Signed)
Agree with [name] management. She could have chronic pancreatitis but so far not shown by imaging. Await CT - lipase was mildly elevated - could need an EUS  Iva Boop, MD, [facility]

## 2012-12-14 NOTE — Patient Instructions (Addendum)
Your physician has requested that you go to the basement for the following lab work before leaving today: CBC, CMET, Amylase, Lipase  You have been scheduled for a CT scan of the abdomen at [facility] CT ([address]---this is in the same building as Architectural technologist).   You are scheduled on Wednesday, 12/16/12 at 10:00 am. You should arrive 15 minutes prior to your appointment time for registration. Please follow the written instructions below on the day of your exam:  WARNING: IF YOU ARE ALLERGIC TO IODINE/X-RAY DYE, PLEASE NOTIFY RADIOLOGY IMMEDIATELY AT [phone]! YOU WILL BE GIVEN A 13 HOUR PREMEDICATION PREP.  1) Do not eat or drink anything after 6:00 am (4 hours prior to your test) 2) You have been given 2 bottles of oral contrast to drink. The solution may taste better if refrigerated, but do NOT add ice or any other liquid to this solution. Shake well before drinking.    Drink 1 bottle of contrast @ 8:00 am (2 hours prior to your exam)  Drink 1 bottle of contrast @ 9:00 am (1 hour prior to your exam)  You may take any medications as prescribed with a small amount of water except for the following: Metformin, Glucophage, Glucovance, Avandamet, Riomet, Fortamet, Actoplus Met, Janumet, Glumetza or Metaglip. The above medications must be held the day of the exam AND 48 hours after the exam.  The purpose of you drinking the oral contrast is to aid in the visualization of your intestinal tract. The contrast solution may cause some diarrhea. Before your exam is started, you will be given a small amount of fluid to drink. Depending on your individual set of symptoms, you may also receive an intravenous injection of x-ray contrast/dye. Plan on being at [facility] for 30 minutes or long, depending on the type of exam you are having performed.  This test typically takes 30-45 minutes to complete.  If you have any questions regarding your exam or if you need to  reschedule, you may call the CT department at [phone] between the hours of 8:00 am and 5:00 pm, Monday-Friday.  ________________________________________________________________________  CC: Dr Margaretmary Bayley, Dr Stan Head

## 2012-12-16 ENCOUNTER — Ambulatory Visit (INDEPENDENT_AMBULATORY_CARE_PROVIDER_SITE_OTHER)
Admission: RE | Admit: 2012-12-16 | Discharge: 2012-12-16 | Disposition: A | Discharge status: Home / Self Care | Payer: Medicare Other | Source: Ambulatory Visit | Attending: Gastroenterology | Admitting: Gastroenterology

## 2012-12-16 DIAGNOSIS — R1013 Epigastric pain: Secondary | ICD-10-CM

## 2012-12-16 MED ORDER — IOHEXOL 300 MG/ML  SOLN
100.0000 mL | Freq: Once | INTRAMUSCULAR | Status: AC | PRN
Start: 2012-12-16 — End: 2012-12-16
  Administered 2012-12-16: 100 mL via INTRAVENOUS

## 2012-12-16 NOTE — Progress Notes (Signed)
Quick Note:  I think most likely pancreatitis - she is on a PPI She should be on a liquid diet Analgesics (narcotics ok) Make sure she is taking her Nexium If she fails to respond to this possibly do EGD Have her see me or you/other APP (work-in) next week when I am in office ______

## 2012-12-17 ENCOUNTER — Telehealth: Payer: Self-pay | Admitting: *Deleted

## 2012-12-17 MED ORDER — HYDROCODONE-ACETAMINOPHEN 5-325 MG PO TABS: 1.0000 | ORAL_TABLET | ORAL | Status: DC | PRN

## 2012-12-17 MED ORDER — PROMETHAZINE HCL 25 MG PO TABS: 25.0000 mg | ORAL_TABLET | Freq: Three times a day (TID) | ORAL | Status: DC | PRN

## 2012-12-17 NOTE — Telephone Encounter (Signed)
Medications sent. Patient advised.

## 2012-12-17 NOTE — Telephone Encounter (Signed)
I have spoken to patient and have outlined Dr Marvell Fuller and Doug Sou, PA-C recommendations. She verbalizes understanding. She has not been on a clear liquid diet so she will start that. She is taking Nexium and has been taking ultram for pain. However, the ultram is not effective in pain control for her. Patient has a follow up appointment with Shanda Bumps on 12/23/12 @ 8:30 am for recheck. I advised that we will discuss further recommendations (i.e.-EGD) at that visit. She is to call our office prior to this should her symptoms worsen or not improve somewhat with the recommendations given. Patient would like vicodin prescription as well as nausea medication. Dr Leone Payor Shanda Bumps is off today), please advise.

## 2012-12-17 NOTE — Telephone Encounter (Signed)
Message copied by Richardson Chiquito on Thu Dec 17, 2012  8:30 AM ------      Message from: Doug Sou D      Created: Wed Dec 16, 2012  4:38 PM      Regarding: Please call patient       [name],            Please see message below from Dr. Leone Payor.  Please tell her that she likely has a mild pancreatitis according to CT scan.  And follow guidelines below.  She has ultram listed on her med list so see if she is taking it for pain and if it helps.  She can have a refill if needed, but if it does not help then can have a script for some vicodin.  Needs follow-up appointment.            Thank you a bunch,            Jess                  ----- Message -----         From: Iva Boop, MD         Sent: 12/16/2012   1:42 PM           To: [name], PA-C            I think most likely pancreatitis - she is on a PPI      She should be on a liquid diet      Analgesics (narcotics ok)      Make sure she is taking her Nexium      If she fails to respond to this possibly do EGD      Have her see me or you/other APP  (work-in) next week when I am in office       ------

## 2012-12-17 NOTE — Telephone Encounter (Signed)
Hydrocodone 5-APAP 325 1 every 4 hrs as needed #30 no refills Promethazine 25 mg every 8 hrs prn #30 no refills

## 2012-12-21 ENCOUNTER — Telehealth: Payer: Self-pay

## 2012-12-21 NOTE — Telephone Encounter (Signed)
Message copied by Annett Fabian on Mon Dec 21, 2012 10:36 AM ------      Message from: Iva Boop      Created: Fri Dec 18, 2012  3:37 PM       Let us reassess her next week by phone or  In office      I really doubt EGD will tell us anything as I suspect pancreatitis and bowel edema but sometimes forced to do it      I am ccing Lavonna Rua                  ----- Message -----         From: [name], PA-C         Sent: 12/16/2012  12:00 PM           To: Iva Boop, MD            ? EGD.  She is on Nexium.  Let me know what you want to do.            Jess            ----- Message -----         From: Rad Results In Interface         Sent: 12/16/2012  10:52 AM           To: [name], PA-C                         ------

## 2012-12-21 NOTE — Telephone Encounter (Signed)
Patient scheduled for 12/23/12 9:30 in [facility] she will come for a pre-visit 12/22/12.  I have cancelled the follow up appt with Doug Sou, PA for 12/23/12

## 2012-12-21 NOTE — Telephone Encounter (Signed)
Patient reports "no real improvement"  In her symptoms.  Do you want her to have to be set up for EGD, or see you in the office?

## 2012-12-21 NOTE — Telephone Encounter (Signed)
EGD this week

## 2012-12-22 ENCOUNTER — Ambulatory Visit (AMBULATORY_SURGERY_CENTER): Payer: Medicare Other

## 2012-12-22 ENCOUNTER — Telehealth: Payer: Self-pay

## 2012-12-22 VITALS — Ht 64.0 in

## 2012-12-22 DIAGNOSIS — R1013 Epigastric pain: Secondary | ICD-10-CM

## 2012-12-22 DIAGNOSIS — R1011 Right upper quadrant pain: Secondary | ICD-10-CM

## 2012-12-22 NOTE — Telephone Encounter (Signed)
Dr.Gessner,The pt was just seen for her pre-visit prior to her endoscopy with you tomorrow ( 12/23/12) She states she took herself off of her Plavix since 09/22/12 and Dr Algie Coffer is aware of this. I wanted to make you aware of this, since she has the history of strokes.Thanks.Cherylann Ratel

## 2012-12-23 ENCOUNTER — Ambulatory Visit: Payer: Medicare Other | Admitting: Gastroenterology

## 2012-12-23 ENCOUNTER — Ambulatory Visit (AMBULATORY_SURGERY_CENTER): Payer: Medicare Other | Admitting: Internal Medicine

## 2012-12-23 ENCOUNTER — Encounter: Payer: Self-pay | Admitting: Internal Medicine

## 2012-12-23 VITALS — BP 134/74 | HR 60 | Temp 97.4°F | Resp 18 | Ht 64.0 in | Wt 133.2 lb

## 2012-12-23 DIAGNOSIS — R933 Abnormal findings on diagnostic imaging of other parts of digestive tract: Secondary | ICD-10-CM

## 2012-12-23 DIAGNOSIS — R1013 Epigastric pain: Secondary | ICD-10-CM

## 2012-12-23 LAB — GLUCOSE, CAPILLARY
Glucose-Capillary: 164 mg/dL — ABNORMAL HIGH (ref 70–99)
Glucose-Capillary: 163 mg/dL — ABNORMAL HIGH (ref 70–99)

## 2012-12-23 MED ORDER — OXYCODONE HCL 5 MG PO TABS: 5.0000 mg | ORAL_TABLET | Freq: Four times a day (QID) | ORAL | Status: DC | PRN

## 2012-12-23 MED ORDER — SODIUM CHLORIDE 0.9 % IV SOLN: 500.0000 mL | INTRAVENOUS | Status: DC

## 2012-12-23 NOTE — Progress Notes (Signed)
Procedure ends, to recovery, report given and VSS. 

## 2012-12-23 NOTE — Patient Instructions (Addendum)
The endoscopy exam looked ok except for some food still in the stomach. i am not sure what is causing your problem - the pancreas remains most likely problem.  Please stay on a light diet and I will change pain medicine to see if that helps you and does not cause itcching.  I will get you an appointment to see me next week sometime for follow-up. Eventually need to arrange for you to have a test called an endoscopic ultrasoun "EUS" to look more closely at the pancreas.  I appreciate the opportunity to care for you. Iva Boop, MD, FACG   YOU HAD AN ENDOSCOPIC PROCEDURE TODAY AT THE [facility]: Refer to the procedure report that was given to you for any specific questions about what was found during the examination.  If the procedure report does not answer your questions, please call your gastroenterologist to clarify.  If you requested that your care partner not be given the details of your procedure findings, then the procedure report has been included in a sealed envelope for you to review at your convenience later.  YOU SHOULD EXPECT: Some feelings of bloating in the abdomen. Passage of more gas than usual.  Walking can help get rid of the air that was put into your GI tract during the procedure and reduce the bloating. If you had a lower endoscopy (such as a colonoscopy or flexible sigmoidoscopy) you may notice spotting of blood in your stool or on the toilet paper. If you underwent a bowel prep for your procedure, then you may not have a normal bowel movement for a few days.  DIET: Your first meal following the procedure should be a light meal and then it is ok to progress to your normal diet.  A half-sandwich or bowl of soup is an example of a good first meal.  Heavy or fried foods are harder to digest and may make you feel nauseous or bloated.  Likewise meals heavy in dairy and vegetables can cause extra gas to form and this can also increase the bloating.  Drink plenty of  fluids but you should avoid alcoholic beverages for 24 hours.  ACTIVITY: Your care partner should take you home directly after the procedure.  You should plan to take it easy, moving slowly for the rest of the day.  You can resume normal activity the day after the procedure however you should NOT DRIVE or use heavy machinery for 24 hours (because of the sedation medicines used during the test).    SYMPTOMS TO REPORT IMMEDIATELY: A gastroenterologist can be reached at any hour.  During normal business hours, 8:30 AM to 5:00 PM Monday through Friday, call [phone].  After hours and on weekends, please call the GI answering service at [phone] who will take a message and have the physician on call contact you.    Following upper endoscopy (EGD)  Vomiting of blood or coffee ground material  New chest pain or pain under the shoulder blades  Painful or persistently difficult swallowing  New shortness of breath  Fever of 100F or higher  Black, tarry-looking stools  FOLLOW UP: If any biopsies were taken you will be contacted by phone or by letter within the next 1-3 weeks.  Call your gastroenterologist if you have not heard about the biopsies in 3 weeks.  Our staff will call the home number listed on your records the next business day following your procedure to check on you and address any  questions or concerns that you may have at that time regarding the information given to you following your procedure. This is a courtesy call and so if there is no answer at the home number and we have not heard from you through the emergency physician on call, we will assume that you have returned to your regular daily activities without incident.  SIGNATURES/CONFIDENTIALITY: You and/or your care partner have signed paperwork which will be entered into your electronic medical record.  These signatures attest to the fact that that the information above on your After Visit Summary has been reviewed and  is understood.  Full responsibility of the confidentiality of this discharge information lies with you and/or your care-partner.  Resume medications except hydrocodone. Information given on soft and low fat diet with discharge instructions.

## 2012-12-23 NOTE — Progress Notes (Signed)
Patient did not experience any of the following events: a burn prior to discharge; a fall within the facility; wrong site/side/patient/procedure/implant event; or a hospital transfer or hospital admission upon discharge from the facility. (G8907) Patient did not have preoperative order for IV antibiotic SSI prophylaxis. (G8918)  

## 2012-12-23 NOTE — Op Note (Signed)
[facility] [address] [place], [address]   ENDOSCOPY PROCEDURE REPORT  PATIENT: Berma, Harts  MR#: [id number] BIRTHDATE: 1956/04/23 , 56  yrs. old GENDER: Female ENDOSCOPIST: Iva Boop, MD, [facility] PROCEDURE DATE:  12/23/2012 PROCEDURE:  EGD, diagnostic ASA CLASS:     Class III INDICATIONS:  Epigastric pain.   abnormal CT of the GI tract. Pancreatitis vs. duodenitis vs. both MEDICATIONS: Propofol (Diprivan) 130 mg IV, MAC sedation, administered by CRNA, and These medications were titrated to patient response per physician's verbal order TOPICAL ANESTHETIC: none  DESCRIPTION OF PROCEDURE: After the risks benefits and alternatives of the procedure were thoroughly explained, informed consent was obtained.  The LB [id number] endoscope was introduced through the mouth and advanced to the second portion of the duodenum. Without limitations.  The instrument was slowly withdrawn as the mucosa was fully examined.     STOMACH: Food residue was found in the gastric body.  DUODENUM: Food residue was found in the duodenal bulb.  The remainder of the upper endoscopy exam was otherwise normal. Retroflexed views revealed as above.     The scope was then withdrawn from the patient and the procedure completed.  COMPLICATIONS: There were no complications. ENDOSCOPIC IMPRESSION: 1.   Food residue in the gastric body 2.   Food residue was found in the duodenal bulb 3.   The remainder of the upper endoscopy exam was otherwise normal  RECOMMENDATIONS: Change from hydrocodone to oxycodone - itching with hydrococone See me next week - stay on light diet - liquids/soft low fat Eventually will need an EUS   eSigned:  Iva Boop, MD, [facility] 12/23/2012 10:14 AM  FA:[id number] Chestine Spore, MD and The Patient

## 2012-12-24 ENCOUNTER — Telehealth: Payer: Self-pay | Admitting: *Deleted

## 2012-12-24 NOTE — Telephone Encounter (Signed)
  Follow up Call-  Call back number 12/23/2012  Post procedure Call Back phone  # [phone]  Permission to leave phone message Yes     Patient questions:  Do you have a fever, pain , or abdominal swelling? no Pain Score  0 *  Have you tolerated food without any problems? yes  Have you been able to return to your normal activities? yes  Do you have any questions about your discharge instructions: Diet   no Medications  no Follow up visit  no  Do you have questions or concerns about your Care? no  Actions: * If pain score is 4 or above: No action needed, pain <4.

## 2012-12-28 ENCOUNTER — Telehealth: Payer: Self-pay | Admitting: Internal Medicine

## 2012-12-28 NOTE — Telephone Encounter (Signed)
I have left a message for the patient with the appt for Friday 01/01/13 10;15.  She is asked to call back if this will not work with her schedule.

## 2013-01-01 ENCOUNTER — Ambulatory Visit (INDEPENDENT_AMBULATORY_CARE_PROVIDER_SITE_OTHER): Payer: Medicare Other | Admitting: Internal Medicine

## 2013-01-01 ENCOUNTER — Encounter: Payer: Self-pay | Admitting: Internal Medicine

## 2013-01-01 VITALS — BP 110/80 | HR 88 | Ht 64.0 in | Wt 132.2 lb

## 2013-01-01 DIAGNOSIS — K859 Acute pancreatitis without necrosis or infection, unspecified: Secondary | ICD-10-CM

## 2013-01-01 DIAGNOSIS — K589 Irritable bowel syndrome without diarrhea: Secondary | ICD-10-CM

## 2013-01-01 MED ORDER — PANCRELIPASE (LIP-PROT-AMYL) 36000-114000 UNITS PO CPEP: 1.0000 | ORAL_CAPSULE | Freq: Three times a day (TID) | ORAL | Status: DC

## 2013-01-01 MED ORDER — ONDANSETRON 8 MG PO TBDP: 8.0000 mg | ORAL_TABLET | Freq: Three times a day (TID) | ORAL | Status: DC

## 2013-01-01 MED ORDER — DICYCLOMINE HCL 20 MG PO TABS: 20.0000 mg | ORAL_TABLET | Freq: Three times a day (TID) | ORAL | Status: DC

## 2013-01-01 NOTE — Progress Notes (Signed)
  Subjective:    Patient ID: [name], female    DOB: [birth year], 57 y.o.   MRN: [id number]  HPI Follow-up of CT-diagnosed pancreatitis. [name] is here with persistent epigastric pain that intensifies with eating. She is afraid to eat because of it. There is associated nausea/ Oxycodone helps pain w/o itching like hydrocodone caused but she does not like to use it. She is still losing weight.No fevers. Is having some radiation of pain to back. She has experienced some urgent post-prandial defecation also.  Medications, allergies, past medical history, past surgical history, family history and social history are reviewed and updated in the EMR.   Review of Systems As above Hoping to go to high school reunion in [place] in Oct    Objective:   Physical Exam WDWN NAD Eyes anicteric Abdomen soft, moderately tender epigastrium, BS active, no bruits  Wt Readings from Last 3 Encounters:  01/01/13 132 lb 4 oz (59.988 kg)  12/23/12 133 lb 3.2 oz (60.419 kg)  12/14/12 135 lb (61.236 kg)      Assessment & Plan:  Pancreatitis  IBS (irritable bowel syndrome)

## 2013-01-01 NOTE — Patient Instructions (Addendum)
Today we are giving you samples of Creon to use one capsule with meals daily.  Try taking your generic zofran and your generic bentyl before meals to see if that works better.  These rx's have been sent to your pharmacy.  Stay on your low fat diet and we need to see you back in 2 weeks.  I appreciate the opportunity to care for you.

## 2013-01-02 DIAGNOSIS — K859 Acute pancreatitis without necrosis or infection, unspecified: Secondary | ICD-10-CM | POA: Insufficient documentation

## 2013-01-02 NOTE — Assessment & Plan Note (Signed)
Based upon CT. Not entirely sure she has this but clinically consistent so will treat as this Continue liquid to low fat diet  REV 2 weeks and anticipate repeat cross sectional imaging then

## 2013-01-02 NOTE — Assessment & Plan Note (Signed)
Add dicyclomine 20 mg qac for pc diarrhea and pain

## 2013-01-18 ENCOUNTER — Ambulatory Visit (INDEPENDENT_AMBULATORY_CARE_PROVIDER_SITE_OTHER): Payer: Medicare Other | Admitting: Internal Medicine

## 2013-01-18 ENCOUNTER — Encounter: Payer: Self-pay | Admitting: Internal Medicine

## 2013-01-18 VITALS — BP 118/70 | HR 88 | Ht 64.0 in | Wt 133.0 lb

## 2013-01-18 DIAGNOSIS — K859 Acute pancreatitis without necrosis or infection, unspecified: Secondary | ICD-10-CM

## 2013-01-18 DIAGNOSIS — K589 Irritable bowel syndrome without diarrhea: Secondary | ICD-10-CM

## 2013-01-18 MED ORDER — PANCRELIPASE (LIP-PROT-AMYL) 36000-114000 UNITS PO CPEP: 1.0000 | ORAL_CAPSULE | Freq: Three times a day (TID) | ORAL | Status: DC

## 2013-01-18 NOTE — Patient Instructions (Signed)
You have been scheduled for a CT scan of the abdomen and pelvis at [facility] ([address]---this is in the same building as [facility]).   You are scheduled on 02-01-2013 at 9 am. You should arrive 15 minutes prior to your appointment time for registration. Please follow the written instructions below on the day of your exam:  WARNING: IF YOU ARE ALLERGIC TO IODINE/X-RAY DYE, PLEASE NOTIFY RADIOLOGY IMMEDIATELY AT [phone]! YOU WILL BE GIVEN A 13 HOUR PREMEDICATION PREP.  1) Do not eat or drink anything after 5 am (4 hours prior to your test) 2) You have been given 2 bottles of oral contrast to drink. The solution may taste better if refrigerated, but do NOT add ice or any other liquid to this solution. Shake well before drinking.    Drink 1 bottle of contrast @ 7 am (2 hours prior to your exam)  Drink 1 bottle of contrast @ 8 am (1 hour prior to your exam)  You may take any medications as prescribed with a small amount of water except for the following: Metformin, Glucophage, Glucovance, Avandamet, Riomet, Fortamet, Actoplus Met, Janumet, Glumetza or Metaglip. The above medications must be held the day of the exam AND 48 hours after the exam.  The purpose of you drinking the oral contrast is to aid in the visualization of your intestinal tract. The contrast solution may cause some diarrhea. Before your exam is started, you will be given a small amount of fluid to drink. Depending on your individual set of symptoms, you may also receive an intravenous injection of x-ray contrast/dye. Plan on being at [facility] for 30 minutes or long, depending on the type of exam you are having performed. _________________________________________________________________________________________________________________________________________________________________________________________  Your physician has requested that you go to the basement for the following lab work before  leaving today: BMET  We have sent the following medications to your pharmacy for you to pick up at your convenience: Creon, please take as directed   Please follow up with Dr. Leone Payor in six weeks   This test typically takes 30-45 minutes to complete.  If you have any questions regarding your exam or if you need to reschedule, you may call the CT department at [phone] between the hours of 8:00 am and 5:00 pm, Monday-Friday.  ________________________________________________________________________

## 2013-01-18 NOTE — Progress Notes (Signed)
  Subjective:    Patient ID: [name], female    DOB: 09/14/1955, 57 y.o.   MRN: [id number]  HPI [name] returns noting that she gained 1#. Less abdominal pain and diarrhea reported. Less nausea. Ondansetron and dicyclomine are effective and she thinks Creon helpin. Has had a few days with very loose stools. Overall better.using Absorbine Jr. Patch on right flank at times also and that helps.  Medications, allergies, past medical history, past surgical history, family history and social history are reviewed and updated in the EMR.  Review of Systems Wt Readings from Last 3 Encounters:  01/18/13 133 lb (60.328 kg)  01/01/13 132 lb 4 oz (59.988 kg)  12/23/12 133 lb 3.2 oz (60.419 kg)      Objective:   Physical Exam WDWN NAD Abdomen is soft and slightly tender in epigastrium, BS ++    Assessment & Plan:  Pancreatitis IBS (irritable bowel syndrome)

## 2013-01-18 NOTE — Assessment & Plan Note (Signed)
Better - continue dicyclomine

## 2013-01-18 NOTE — Assessment & Plan Note (Signed)
Improving - continue Creon and check CT abd in about 2 weeks See me 6 weeks

## 2013-01-27 ENCOUNTER — Telehealth: Payer: Self-pay | Admitting: Internal Medicine

## 2013-01-27 NOTE — Telephone Encounter (Signed)
I have cancelled the CT scan for the 29th.  Dr. Leone Payor are you willing to write a letter stating she should not have to pay for full portions at a restaurant?

## 2013-01-27 NOTE — Telephone Encounter (Signed)
Per pt she would like to cx her Ct because she is going to be out of town  She would also like a note from Dr. Leone Payor for when she goes out to eat stating that due to her stomach problems she needs to eat smaller portions, and should be exepmt from paying for a full meal.

## 2013-01-28 NOTE — Telephone Encounter (Signed)
Noted. Sorry I cannot help her with a note about meals

## 2013-01-28 NOTE — Telephone Encounter (Signed)
Patient advised.

## 2013-02-01 ENCOUNTER — Other Ambulatory Visit: Payer: Medicare Other

## 2013-02-12 ENCOUNTER — Other Ambulatory Visit: Payer: Self-pay | Admitting: Internal Medicine

## 2013-02-12 DIAGNOSIS — Z139 Encounter for screening, unspecified: Secondary | ICD-10-CM

## 2013-02-22 ENCOUNTER — Ambulatory Visit (HOSPITAL_COMMUNITY): Payer: Medicare Other

## 2013-02-23 ENCOUNTER — Ambulatory Visit (HOSPITAL_COMMUNITY)
Admission: RE | Admit: 2013-02-23 | Discharge: 2013-02-23 | Disposition: A | Discharge status: Home / Self Care | Payer: Medicare Other | Source: Ambulatory Visit | Attending: Internal Medicine | Admitting: Internal Medicine

## 2013-02-23 DIAGNOSIS — Z139 Encounter for screening, unspecified: Secondary | ICD-10-CM

## 2013-02-23 DIAGNOSIS — Z1231 Encounter for screening mammogram for malignant neoplasm of breast: Secondary | ICD-10-CM | POA: Insufficient documentation

## 2013-03-11 ENCOUNTER — Other Ambulatory Visit: Payer: Self-pay

## 2013-05-13 ENCOUNTER — Telehealth (INDEPENDENT_AMBULATORY_CARE_PROVIDER_SITE_OTHER): Payer: Self-pay | Admitting: General Surgery

## 2013-05-13 NOTE — Telephone Encounter (Signed)
Pt called for appt to evaluate newly developing hidradenitis in both her groins.  She requests meds be called in to for her to start on while waiting for appt (next week), saying she is out of the Mupirocin 2% cream, which seems to help sometimes.  If willing to call in, [facility] [place]

## 2013-05-14 ENCOUNTER — Other Ambulatory Visit (INDEPENDENT_AMBULATORY_CARE_PROVIDER_SITE_OTHER): Payer: Self-pay

## 2013-05-14 ENCOUNTER — Telehealth (INDEPENDENT_AMBULATORY_CARE_PROVIDER_SITE_OTHER): Payer: Self-pay

## 2013-05-14 DIAGNOSIS — L732 Hidradenitis suppurativa: Secondary | ICD-10-CM

## 2013-05-14 MED ORDER — MUPIROCIN CALCIUM 2 % EX CREA: 1.0000 "application " | TOPICAL_CREAM | Freq: Three times a day (TID) | CUTANEOUS | Status: DC

## 2013-05-14 NOTE — Telephone Encounter (Signed)
Message copied by Ivor Costa on Fri May 14, 2013 10:46 AM ------      Message from: [place], [place]: Mon May 10, 2013  1:21 PM      Regarding: Appt Needed      Contact: [phone]       Pt walked in this afternoon requesting an antibiotic due to past hidradenitis which is back again. Clarise Cruz tried to scheduled her to come in on 05/11/13, but the appt date/time was not manageable for her.  Please call her with an appt/Rx.... DF    ------

## 2013-05-14 NOTE — Telephone Encounter (Signed)
Reviewed with Dr. Hassell Done verbal order given for Mupirocin 2% cream [facility].  Patient notified of prescription.

## 2013-05-18 NOTE — Telephone Encounter (Signed)
Verbal order rec'd for Mupirocin Cream by Dr. Hassell Done.  Appt has been scheduled for 11515 @ 10:00 w/Dr. Hassell Done

## 2013-05-20 ENCOUNTER — Ambulatory Visit (INDEPENDENT_AMBULATORY_CARE_PROVIDER_SITE_OTHER): Payer: Medicare Other | Admitting: Surgery

## 2013-05-20 ENCOUNTER — Encounter (INDEPENDENT_AMBULATORY_CARE_PROVIDER_SITE_OTHER): Payer: Self-pay | Admitting: Surgery

## 2013-05-20 VITALS — BP 120/82 | HR 80 | Temp 97.0°F | Resp 15 | Ht 64.0 in | Wt 137.8 lb

## 2013-05-20 DIAGNOSIS — L732 Hidradenitis suppurativa: Secondary | ICD-10-CM

## 2013-05-20 MED ORDER — FLUCONAZOLE 200 MG PO TABS: 200.0000 mg | ORAL_TABLET | Freq: Every day | ORAL | Status: DC

## 2013-05-20 MED ORDER — DOXYCYCLINE HYCLATE 100 MG PO TABS
100.0000 mg | ORAL_TABLET | Freq: Every day | ORAL | Status: DC
Start: 2013-05-20 — End: 2013-09-10

## 2013-05-20 NOTE — Patient Instructions (Signed)
Buy "Hibiclens" at pharmacy and bath your affected areas every other night at bedtime.

## 2013-05-20 NOTE — Progress Notes (Signed)
[name] 57 y.o.  Body mass index is 23.64 kg/(m^2).  Patient Active Problem List   Diagnosis Date Noted  . Pancreatitis 01/02/2013  . Abdominal pain, epigastric 12/14/2012  . IBS (irritable bowel syndrome) 09/12/2011  . GERD (gastroesophageal reflux disease) 09/12/2011  . Diverticulosis of colon with hemorrhage 08/10/2011  . Chest pain at rest 07/06/2011    Class: Acute  . Hidradenitis, left pubic area and left lower quadrant 01/18/2011  . DIABETES MELLITUS 08/24/2007  . DYSLIPIDEMIA 08/24/2007  . HYPERTENSION 08/24/2007    Allergies  Allergen Reactions  . Bactrim [Sulfamethoxazole-Trimethoprim] Shortness Of Breath  . Sulfa Antibiotics Shortness Of Breath and Palpitations  . Iodine Swelling  . Metformin And Related Diarrhea  . Adhesive [Tape] Rash  . Shellfish Allergy Rash    Past Surgical History  Procedure Laterality Date  . Hiatal hernia repair    . Breast reduction surgery    . Axillary hidradenitis excision  1990-2008    bilateral  . Hydradenitis excision  01/2011; 03/2012    'groin and abdomen; 03/2012" (09/14/2012)  . Anterior cervical decomp/discectomy fusion  2002  . Colonoscopy  08/12/2011    Procedure: COLONOSCOPY;  Surgeon: Ladene Artist, MD,FACG;  Location: [facility] ENDOSCOPY;  Service: Endoscopy;  Laterality: N/A;  . Esophagogastroduodenoscopy  08/12/2011    Procedure: ESOPHAGOGASTRODUODENOSCOPY (EGD);  Surgeon: Ladene Artist, MD,FACG;  Location: [facility] END[facility]COPY;  Service: Endoscopy;  Laterality: N/A;  . Givens capsule study  08/13/2011    Procedure: GIVENS CAPSULE STUDY;  Surgeon: Ladene Artist, MD,FACG;  Location: [facility];  Service: Endoscopy;  Laterality: N/A;  . Nissen fundoplication  2458  . Uvulopalatopharyngoplasty, tonsillectomy and septoplasty  2000's  . Hammer toe surgery Bilateral ~ 2000  . Colonoscopy  06/19/2006  . Esophagogastroduodenoscopy  06/04/2005  . Cardiac catheterization  2004    mild disease  . Back surgery    . Abdominal  hysterectomy  1985  . Cholecystectomy  1990's  . Hydradenitis excision  04/01/2012    Procedure: EXCISION HYDRADENITIS GROIN;  Surgeon: Pedro Earls, MD;  Location: [facility] ORS;  Service: General;  Laterality: Bilateral;  Excision of Hydradenitis of Perineum  . Tonsillectomy and adenoidectomy  1959  . Appendectomy  1985  . Breast cyst excision Right 2008  . Reduction mammaplasty  1996?  Marland Kitchen Breast biopsy Right 2007  . Posterior lumbar fusion  2008 X 2   Foye Spurling, MD 1. Hidradenitis     [name] Is having painful hidradenitis in her groin. She pointed to a spot in the midline where I had previously excised hidradenitis and had of local flare there she has an active. A 20 or leg and her perineum on the left side. It doesn't appear that there is anything that I would incise and drain today but I think she needs to be back on doxycycline. Sure reminded me that she likes to have Diflucan and she always gets a yeast infection after this antibiotic treatement.    I recommended that she wash every other night with Hibiclens. I will continue to follow her as needed. She has had some financial difficulties and I went ahead and agreed to see her today and we'll make her a no charge because of that. Matt B. Hassell Done, MD, [facility] [phone] beeper [phone]  05/20/2013 10:35 AM

## 2013-07-15 DIAGNOSIS — E538 Deficiency of other specified B group vitamins: Secondary | ICD-10-CM

## 2013-07-15 HISTORY — DX: Deficiency of other specified B group vitamins: E53.8

## 2013-07-19 ENCOUNTER — Other Ambulatory Visit: Payer: Self-pay | Admitting: Neurology

## 2013-07-19 DIAGNOSIS — Z8673 Personal history of transient ischemic attack (TIA), and cerebral infarction without residual deficits: Secondary | ICD-10-CM

## 2013-07-22 ENCOUNTER — Ambulatory Visit (HOSPITAL_COMMUNITY)
Admission: RE | Admit: 2013-07-22 | Discharge: 2013-07-22 | Disposition: A | Discharge status: Home / Self Care | Payer: Medicare Other | Source: Ambulatory Visit | Attending: Neurology | Admitting: Neurology

## 2013-07-22 DIAGNOSIS — R55 Syncope and collapse: Secondary | ICD-10-CM | POA: Insufficient documentation

## 2013-07-22 DIAGNOSIS — E119 Type 2 diabetes mellitus without complications: Secondary | ICD-10-CM | POA: Insufficient documentation

## 2013-07-22 DIAGNOSIS — Z8673 Personal history of transient ischemic attack (TIA), and cerebral infarction without residual deficits: Secondary | ICD-10-CM | POA: Insufficient documentation

## 2013-07-22 DIAGNOSIS — E785 Hyperlipidemia, unspecified: Secondary | ICD-10-CM | POA: Insufficient documentation

## 2013-07-22 DIAGNOSIS — I2510 Atherosclerotic heart disease of native coronary artery without angina pectoris: Secondary | ICD-10-CM | POA: Insufficient documentation

## 2013-07-22 DIAGNOSIS — I658 Occlusion and stenosis of other precerebral arteries: Secondary | ICD-10-CM | POA: Insufficient documentation

## 2013-07-22 DIAGNOSIS — I6529 Occlusion and stenosis of unspecified carotid artery: Secondary | ICD-10-CM | POA: Insufficient documentation

## 2013-07-22 DIAGNOSIS — I10 Essential (primary) hypertension: Secondary | ICD-10-CM | POA: Insufficient documentation

## 2013-08-24 ENCOUNTER — Telehealth (INDEPENDENT_AMBULATORY_CARE_PROVIDER_SITE_OTHER): Payer: Self-pay

## 2013-08-24 NOTE — Telephone Encounter (Signed)
Patient requesting for Mupirocin 2 % cr. [facility] . She is asking for sooner appt, Advised her , No appointment available until 09/02/13 if a cancellation we would call .

## 2013-08-25 ENCOUNTER — Telehealth (INDEPENDENT_AMBULATORY_CARE_PROVIDER_SITE_OTHER): Payer: Self-pay

## 2013-08-25 DIAGNOSIS — L732 Hidradenitis suppurativa: Secondary | ICD-10-CM

## 2013-08-25 MED ORDER — MUPIROCIN CALCIUM 2 % EX CREA: 1.0000 "application " | TOPICAL_CREAM | Freq: Three times a day (TID) | CUTANEOUS | Status: DC

## 2013-08-25 NOTE — Telephone Encounter (Signed)
Pt calling to request a Rx refill of Bactroban ointment.  Dr. Hassell Done has okayed.  [facility] and pt made aware.

## 2013-08-27 NOTE — Telephone Encounter (Signed)
RX called into pharmacy by Jearld Fenton.

## 2013-09-02 ENCOUNTER — Ambulatory Visit (INDEPENDENT_AMBULATORY_CARE_PROVIDER_SITE_OTHER): Payer: Medicare Other | Admitting: Surgery

## 2013-09-02 ENCOUNTER — Encounter (INDEPENDENT_AMBULATORY_CARE_PROVIDER_SITE_OTHER): Payer: Self-pay | Admitting: Surgery

## 2013-09-02 VITALS — BP 120/82 | HR 72 | Temp 97.6°F | Resp 18 | Ht 64.0 in | Wt 144.8 lb

## 2013-09-02 DIAGNOSIS — L732 Hidradenitis suppurativa: Secondary | ICD-10-CM

## 2013-09-02 NOTE — Progress Notes (Signed)
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  She has developed painful lumpy masses in her right lateral thigh.  These are easily palpable.  In addition she is having new areas that have opened up on her mons and the left medial thigh.   Past Medical History  Diagnosis Date  . Hypertension   . Anxiety   . Hyperlipidemia   . GERD (gastroesophageal reflux disease)   . Irritable bowel syndrome   . Sinus problem   . Hidradenitis     groin  . Anemia   . Schatzki's ring   . Diverticulosis of colon with hemorrhage April 2013  . Stomach ulcer 1972    non-bleeding  . Chronic asthmatic bronchitis   . Obesity   . Heart attack     2003 mild MI, March 2013 mild MI  . Left-sided weakness     since stroke, left eye trouble seeing  . Cancer 1985    ovarian, no treatment except surgery  . H/O blood transfusion reaction 1985, April 2013  . Anginal pain   . Pneumonia ~ 2011  . Chronic bronchitis     "I get it q yr" (09/14/2012)  . Type II diabetes mellitus   . H/O hiatal hernia   . Chronic headaches     "daily lately; sometimes monthly" (09/14/2012)  . Migraines   . Stroke 2009, 2010, 2011    total 3 strokes  . Arthritis     "neck, left hand" (09/14/2012)  . Depression     Past Surgical History  Procedure Laterality Date  . Hiatal hernia repair    . Breast reduction surgery    . Axillary hidradenitis excision  1990-2008    bilateral  . Hydradenitis excision  01/2011; 03/2012    'groin and abdomen; 03/2012" (09/14/2012)  . Anterior cervical decomp/discectomy fusion  2002  . Colonoscopy  08/12/2011    Procedure: COLONOSCOPY;  Surgeon: Ladene Artist, MD,FACG;  Location: [facility] ENDOSCOPY;  Service: Endoscopy;  Laterality: N/A;  . Esophagogastroduodenoscopy  08/12/2011    Procedure: ESOPHAGOGASTRODUODENOSCOPY (EGD);  Surgeon:  Ladene Artist, MD,FACG;  Location: [facility] ENDOSCOPY;  Service: Endoscopy;  Laterality: N/A;  . Givens capsule study  08/13/2011    Procedure: GIVENS CAPSULE STUDY;  Surgeon: Ladene Artist, MD,FACG;  Location: [place];  Service: Endoscopy;  Laterality: N/A;  . Nissen fundoplication  2951  . Uvulopalatopharyngoplasty, tonsillectomy and septoplasty  2000's  . Hammer toe surgery Bilateral ~ 2000  . Colonoscopy  06/19/2006  . Esophagogastroduodenoscopy  06/04/2005  . Cardiac catheterization  2004    mild disease  . Back surgery    . Abdominal hysterectomy  1985  . Cholecystectomy  1990's  . Hydradenitis excision  04/01/2012    Procedure: EXCISION HYDRADENITIS GROIN;  Surgeon: Pedro Earls, MD;  Location: [facility] ORS;  Service: General;  Laterality: Bilateral;  Excision of Hydradenitis of Perineum  . Tonsillectomy and adenoidectomy  1959  . Appendectomy  1985  . Breast cyst excision Right 2008  . Reduction mammaplasty  1996?  Marland Kitchen Breast biopsy Right 2007  . Posterior lumbar fusion  2008 X 2    Current Outpatient Prescriptions  Medication Sig Dispense Refill  . acetaminophen (TYLENOL) 500 MG tablet Take 500 mg by mouth every 6 (six) hours as needed for pain.      Marland Kitchen  albuterol (PROVENTIL HFA;VENTOLIN HFA) 108 (90 BASE) MCG/ACT inhaler Inhale 2 puffs into the lungs every 6 (six) hours as needed for wheezing.      Marland Kitchen ALPRAZolam (XANAX) 0.5 MG tablet Take 0.5 mg by mouth at bedtime as needed. For anxiety      . BAYER CONTOUR TEST test strip       . clopidogrel (PLAVIX) 75 MG tablet Take 75 mg by mouth daily.      Marland Kitchen dicyclomine (BENTYL) 20 MG tablet Take 1 tablet (20 mg total) by mouth 3 (three) times daily before meals.  90 tablet  1  . diphenhydrAMINE (BENADRYL) 25 MG tablet Take 25 mg by mouth every 6 (six) hours as needed for itching. For itching or taking certain medications that make her itch.      . doxycycline (VIBRA-TABS) 100 MG tablet Take 1 tablet (100 mg total) by mouth daily.  21 tablet   2  . esomeprazole (NEXIUM) 40 MG capsule Take 40 mg by mouth daily before breakfast.      . fluconazole (DIFLUCAN) 200 MG tablet Take 1 tablet (200 mg total) by mouth daily.  3 tablet  2  . insulin aspart (NOVOLOG) 100 UNIT/ML injection Inject into the skin 3 (three) times daily with meals.      . Insulin Lispro Prot & Lispro (HUMALOG MIX 75/25 KWIKPEN [place]) Inject into the skin.      Marland Kitchen loratadine (CLARITIN) 10 MG tablet Take 10 mg by mouth daily as needed for allergies.      . mometasone (NASONEX) 50 MCG/ACT nasal spray Place 1 spray into the nose daily as needed. For allergies      . mupirocin cream (BACTROBAN) 2 % Apply 1 application topically 3 (three) times daily.  15 g  0  . nitroGLYCERIN (NITRODUR - DOSED IN MG/24 HR) 0.4 mg/hr Place 1 patch onto the skin as needed. Chest pains      . nitroGLYCERIN (NITROSTAT) 0.4 MG SL tablet Place 0.4 mg under the tongue every 5 (five) minutes as needed. Chest pains      . Olmesartan-Amlodipine-HCTZ (TRIBENZOR) 40-5-12.5 MG TABS Take 1 tablet by mouth every morning.       . ondansetron (ZOFRAN-ODT) 8 MG disintegrating tablet Take 1 tablet (8 mg total) by mouth 3 (three) times daily before meals.  40 tablet  1  . Pancrelipase, Lip-Prot-Amyl, (CREON) 36000 UNITS CPEP Take 1 capsule by mouth 3 (three) times daily with meals.  90 capsule  1  . potassium chloride SA (K-DUR,KLOR-CON) 20 MEQ tablet Take 20 mEq by mouth daily as needed (for cramps).      . promethazine (PHENERGAN) 25 MG tablet Take 1 tablet (25 mg total) by mouth every 8 (eight) hours as needed for nausea.  30 tablet  0  . rosuvastatin (CRESTOR) 20 MG tablet Take 20 mg by mouth every morning.       . SUMAtriptan (IMITREX) 50 MG tablet Take 50 mg by mouth every 2 (two) hours as needed for migraine.      . traMADol (ULTRAM) 50 MG tablet Take 100 mg by mouth every 6 (six) hours as needed for pain.      . TRUEPLUS SAFETY LANCETS 28G MISC       . zolpidem (AMBIEN) 10 MG tablet Take 5 mg by mouth at  bedtime as needed. FOR SLEEP      . oxyCODONE (OXY IR/ROXICODONE) 5 MG immediate release tablet Take 1 tablet (5 mg total) by mouth every 6 (  six) hours as needed for pain.  30 tablet  0   No current facility-administered medications for this visit.   Bactrim; Sulfa antibiotics; Iodine; Metformin and related; Adhesive; and Shellfish allergy Family History  Problem Relation Age of Onset  . Breast cancer Mother   . Heart disease Mother   . Hypertension Mother   . Diabetes Father   . Heart disease Father   . Stroke Father   . Heart disease Sister   . Hypertension Sister   . Hypertension Sister   . Hyperlipidemia Sister   . Diabetes Sister   . Emphysema      great uncle  . Aneurysm Sister     brain   Social History:   reports that she has been smoking Cigarettes.  She has a 13.53 pack-year smoking history. She has never used smokeless tobacco. She reports that she does not drink alcohol or use illicit drugs.   REVIEW OF SYSTEMS - PERTINENT POSITIVES ONLY: See above  Physical Exam:   Blood pressure 120/82, pulse 72, temperature 97.6 F (36.4 C), resp. rate 18, height 5\' 4"  (1.626 m), weight 144 lb 12.8 oz (65.681 kg). Body mass index is 24.84 kg/(m^2).  Gen:  WDWN AAF NAD  Neurological: Alert and oriented to person, place, and time. Motor and sensory function is grossly intact  Head: Normocephalic and atraumatic.  Eyes: Conjunctivae are normal. Pupils are equal, round, and reactive to light. No scleral icterus.  Neck: Normal range of motion. Neck supple. No tracheal deviation or thyromegaly present.  Cardiovascular:  SR without murmurs or gallops.  No carotid bruits Respiratory: Effort normal.  No respiratory distress. No chest wall tenderness. Breath sounds normal.  No wheezes, rales or rhonchi.  Abdomen:  unremarkable GU: draining pustule on the mons, tender area on medial thigh;  Right lateral thigh are palpable masses consistent with lipodystrophy Musculoskeletal: Normal  range of motion. Extremities are nontender. No cyanosis, edema or clubbing noted Lymphadenopathy: No cervical, preauricular, postauricular or axillary adenopathy is present Skin: Skin is warm and dry. No rash noted. No diaphoresis. No erythema. No pallor. Pscyh: Normal mood and affect. Behavior is normal. Judgment and thought content normal.   LABORATORY RESULTS: No results found for this or any previous visit (from the past 48 hour(s)).  RADIOLOGY RESULTS: No results found.  Problem List: Patient Active Problem List   Diagnosis Date Noted  . Pancreatitis 01/02/2013  . Abdominal pain, epigastric 12/14/2012  . IBS (irritable bowel syndrome) 09/12/2011  . GERD (gastroesophageal reflux disease) 09/12/2011  . Diverticulosis of colon with hemorrhage 08/10/2011  . Chest pain at rest 07/06/2011    Class: Acute  . Hidradenitis, left pubic area and left lower quadrant 01/18/2011  . DIABETES MELLITUS 08/24/2007  . DYSLIPIDEMIA 08/24/2007  . 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  Plan excision of both areas.     Matt B. Hassell Done, MD, [facility] [phone] beeper [phone]  09/02/2013 11:03 AM

## 2013-09-07 ENCOUNTER — Telehealth (INDEPENDENT_AMBULATORY_CARE_PROVIDER_SITE_OTHER): Payer: Self-pay

## 2013-09-07 ENCOUNTER — Encounter (HOSPITAL_BASED_OUTPATIENT_CLINIC_OR_DEPARTMENT_OTHER): Payer: Self-pay | Admitting: *Deleted

## 2013-09-07 NOTE — Telephone Encounter (Signed)
Pt made aware that her status will be changed to overnight stay per Dr. Hassell Done.  Spoke with [facility] and made them aware.

## 2013-09-07 NOTE — Pre-Procedure Instructions (Signed)
Bring all medications. Plans to come tomorrow for BMET. Requested last office notes, any cardiac testing and ekg from Dr. Merrilee Jansky office.

## 2013-09-07 NOTE — Telephone Encounter (Signed)
Pt is scheduled for surgery on 09/10/13 by Dr. Hassell Done.  She is very concerned because she has no one to stay with her overnight after surgery.  She wanted to know if any arrangements could be made to remain overnight at the hospital.  Her surgery is scheduled at the [facility].  Will call the patient back when we get a response from Dr. Hassell Done.

## 2013-09-07 NOTE — Progress Notes (Signed)
Dr. Al Corpus reviewed EKG, Cardiac cath and sleep study and history of pt. Dr Al Corpus feels pt should be done at main hospital - 3 strokes, angina, diabetic and 2  Heart attacks. Spoke with triage nurse Holley Raring ) at Dr. Earlie Server office and told her cancelled here at day surgery.She will speak with Dr. Earlie Server nurse

## 2013-09-09 NOTE — Patient Instructions (Addendum)
[place]  09/09/2013   Your procedure is scheduled on: Friday May 15th, 2015  Report to [facility] Main Entrance and follow signs to  [place] at 1100 AM.  Call this number if you have problems the morning of surgery [phone]   Remember:  Do not eat food :After Midnight.   clear liquids midnight until 700 am day of surgery, then nothing by mouth.   Take these medicines the morning of surgery with A SIP OF WATER:  XANAX IF NEEDED, ALBUTEROL INHALER IF NEEDED, BRING INHALER AND LEAVE WITH YOUR SISTER, NEXIUM, CLARITIN,NASONEX NASAL SPRAY, CRESTOR, NITRO PATCH PRN, TAKE 1/2 DOSE BEDTIME DOSE HUMALOG ON 09-16-13                               You may not have any metal on your body including hair pins and piercings  Do not wear jewelry, make-up, lotions, powders, or deodorant.   Men may shave face and neck.  Do not bring valuables to the hospital. [place].  Contacts, dentures or bridgework may not be worn into surgery.  Leave suitcase in the car. After surgery it may be brought to your room.  For patients admitted to the hospital, checkout time is 11:00 AM the day of discharge.   Patients discharged the day of surgery will not be allowed to drive home.  Name and phone number of your driver:  Special Instructions: N/A  ________________________________________________________________________  [facility] - Preparing for Surgery Before surgery, you can play an important role.  Because skin is not sterile, your skin needs to be as free of germs as possible.  You can reduce the number of germs on your skin by washing with CHG (chlorahexidine gluconate) soap before surgery.  CHG is an antiseptic cleaner which kills germs and bonds with the skin to continue killing germs even after washing. Please DO NOT use if you have an allergy to CHG or antibacterial soaps.  If your skin becomes reddened/irritated stop using the CHG and inform your  nurse when you arrive at Short Stay. Do not shave (including legs and underarms) for at least 48 hours prior to the first CHG shower.  You may shave your face. Please follow these instructions carefully:  1.  Shower with CHG Soap the night before surgery and the  morning of Surgery.  2.  If you choose to wash your hair, wash your hair first as usual with your  normal  shampoo.  3.  After you shampoo, rinse your hair and body thoroughly to remove the  shampoo.                           4.  Use CHG as you would any other liquid soap.  You can apply chg directly  to the skin and wash                       Gently with a scrungie or clean washcloth.  5.  Apply the CHG Soap to your body ONLY FROM THE NECK DOWN.   Do not use on open                           Wound or open sores. Avoid contact with eyes, ears mouth and genitals (private parts).  Genitals (private parts) with your normal soap.             6.  Wash thoroughly, paying special attention to the area where your surgery  will be performed.  7.  Thoroughly rinse your body with warm water from the neck down.  8.  DO NOT shower/wash with your normal soap after using and rinsing off  the CHG Soap.                9.  Pat yourself dry with a clean towel.            10.  Wear clean pajamas.            11.  Place clean sheets on your bed the night of your first shower and do not  sleep with pets. Day of Surgery : Do not apply any lotions/deodorants the morning of surgery.  Please wear clean clothes to the hospital/surgery center.  FAILURE TO FOLLOW THESE INSTRUCTIONS MAY RESULT IN THE CANCELLATION OF YOUR SURGERY PATIENT SIGNATURE_________________________________  NURSE SIGNATURE__________________________________  ________________________________________________________________________    CLEAR LIQUID DIET   Foods Allowed                                                                     Foods Excluded  Coffee and  tea, regular and decaf                             liquids that you cannot  Plain Jell-O in any flavor                                             see through such as: Fruit ices (not with fruit pulp)                                     milk, soups, orange juice  Iced Popsicles                                    All solid food Carbonated beverages, regular and diet                                    Cranberry, grape and apple juices Sports drinks like Gatorade Lightly seasoned clear broth or consume(fat free) Sugar, honey syrup  Sample Menu Breakfast                                Lunch                                     Supper Cranberry juice                    Beef broth  Chicken broth Jell-O                                     Grape juice                           Apple juice Coffee or tea                        Jell-O                                      Popsicle                                                Coffee or tea                        Coffee or tea  _____________________________________________________________________

## 2013-09-09 NOTE — Progress Notes (Signed)
07-22-13 carotid duplex epic 09-05-12 echo epic

## 2013-09-10 ENCOUNTER — Encounter (HOSPITAL_COMMUNITY): Payer: Self-pay | Admitting: Pharmacy Technician

## 2013-09-10 ENCOUNTER — Ambulatory Visit (HOSPITAL_BASED_OUTPATIENT_CLINIC_OR_DEPARTMENT_OTHER): Admission: RE | Admit: 2013-09-10 | Payer: Medicare Other | Source: Ambulatory Visit | Admitting: Surgery

## 2013-09-10 SURGERY — EXCISION, HIDRADENITIS, INGUINAL REGION
Anesthesia: General | Site: Thigh | Laterality: Right

## 2013-09-13 ENCOUNTER — Ambulatory Visit (HOSPITAL_COMMUNITY)
Admission: RE | Admit: 2013-09-13 | Discharge: 2013-09-13 | Disposition: A | Discharge status: Home / Self Care | Payer: Medicare Other | Source: Ambulatory Visit | Attending: Anesthesiology | Admitting: Anesthesiology

## 2013-09-13 ENCOUNTER — Encounter (HOSPITAL_COMMUNITY): Payer: Self-pay

## 2013-09-13 ENCOUNTER — Encounter (HOSPITAL_COMMUNITY)
Admission: RE | Admit: 2013-09-13 | Discharge: 2013-09-13 | Disposition: A | Discharge status: Home / Self Care | Payer: Medicare Other | Source: Ambulatory Visit | Attending: Surgery | Admitting: Surgery

## 2013-09-13 DIAGNOSIS — Z01812 Encounter for preprocedural laboratory examination: Secondary | ICD-10-CM | POA: Insufficient documentation

## 2013-09-13 DIAGNOSIS — Z01810 Encounter for preprocedural cardiovascular examination: Secondary | ICD-10-CM | POA: Insufficient documentation

## 2013-09-13 DIAGNOSIS — Z981 Arthrodesis status: Secondary | ICD-10-CM | POA: Insufficient documentation

## 2013-09-13 DIAGNOSIS — R079 Chest pain, unspecified: Secondary | ICD-10-CM | POA: Insufficient documentation

## 2013-09-13 DIAGNOSIS — I10 Essential (primary) hypertension: Secondary | ICD-10-CM | POA: Insufficient documentation

## 2013-09-13 DIAGNOSIS — R9431 Abnormal electrocardiogram [ECG] [EKG]: Secondary | ICD-10-CM | POA: Insufficient documentation

## 2013-09-13 DIAGNOSIS — Z01818 Encounter for other preprocedural examination: Secondary | ICD-10-CM | POA: Insufficient documentation

## 2013-09-13 HISTORY — DX: Deficiency of other specified B group vitamins: E53.8

## 2013-09-13 HISTORY — DX: Personal history of other medical treatment: Z92.89

## 2013-09-13 HISTORY — DX: Adverse effect of unspecified anesthetic, initial encounter: T41.45XA

## 2013-09-13 HISTORY — DX: Other complications of anesthesia, initial encounter: T88.59XA

## 2013-09-13 LAB — BASIC METABOLIC PANEL
BUN: 13 mg/dL (ref 6–23)
CO2: 27 mEq/L (ref 19–32)
Calcium: 10.4 mg/dL (ref 8.4–10.5)
Chloride: 100 mEq/L (ref 96–112)
Creatinine, Ser: 0.75 mg/dL (ref 0.50–1.10)
GFR calc Af Amer: >90 mL/min (ref >90)
GFR calc non Af Amer: >90 mL/min (ref >90)
Glucose, Bld: 201 mg/dL — ABNORMAL HIGH (ref 70–99)
Potassium: 4.2 mEq/L (ref 3.7–5.3)
Sodium: 139 mEq/L (ref 137–147)

## 2013-09-13 LAB — CBC
HCT: 40.7 % (ref 36.0–46.0)
Hemoglobin: 13.6 g/dL (ref 12.0–15.0)
MCH: 27.5 pg (ref 26.0–34.0)
MCHC: 33.4 g/dL (ref 30.0–36.0)
MCV: 82.2 fL (ref 78.0–100.0)
Platelets: 217 10*3/uL (ref 150–400)
RBC: 4.95 MIL/uL (ref 3.87–5.11)
RDW: 12.9 % (ref 11.5–15.5)
WBC: 4.2 10*3/uL (ref 4.0–10.5)

## 2013-09-13 NOTE — Progress Notes (Signed)
lov note dr Merlene Laughter neurology 07-15-13 on chart

## 2013-09-17 ENCOUNTER — Encounter (HOSPITAL_COMMUNITY): Payer: Medicare Other | Admitting: Anesthesiology

## 2013-09-17 ENCOUNTER — Encounter (HOSPITAL_COMMUNITY)
Admission: RE | Disposition: A | Discharge status: Home / Self Care | Payer: Self-pay | Source: Ambulatory Visit | Attending: Surgery

## 2013-09-17 ENCOUNTER — Encounter (HOSPITAL_COMMUNITY): Payer: Self-pay | Admitting: *Deleted

## 2013-09-17 ENCOUNTER — Observation Stay (HOSPITAL_COMMUNITY)
Admission: RE | Admit: 2013-09-17 | Discharge: 2013-09-19 | Disposition: A | Discharge status: Home / Self Care | Payer: Medicare Other | Source: Ambulatory Visit | Attending: Surgery | Admitting: Surgery

## 2013-09-17 ENCOUNTER — Ambulatory Visit (HOSPITAL_COMMUNITY): Payer: Medicare Other | Admitting: Anesthesiology

## 2013-09-17 DIAGNOSIS — Z8719 Personal history of other diseases of the digestive system: Secondary | ICD-10-CM | POA: Insufficient documentation

## 2013-09-17 DIAGNOSIS — L732 Hidradenitis suppurativa: Secondary | ICD-10-CM

## 2013-09-17 DIAGNOSIS — I10 Essential (primary) hypertension: Secondary | ICD-10-CM | POA: Insufficient documentation

## 2013-09-17 DIAGNOSIS — F329 Major depressive disorder, single episode, unspecified: Secondary | ICD-10-CM | POA: Insufficient documentation

## 2013-09-17 DIAGNOSIS — K449 Diaphragmatic hernia without obstruction or gangrene: Secondary | ICD-10-CM | POA: Insufficient documentation

## 2013-09-17 DIAGNOSIS — J45909 Unspecified asthma, uncomplicated: Secondary | ICD-10-CM | POA: Insufficient documentation

## 2013-09-17 DIAGNOSIS — I69998 Other sequelae following unspecified cerebrovascular disease: Secondary | ICD-10-CM | POA: Insufficient documentation

## 2013-09-17 DIAGNOSIS — R072 Precordial pain: Secondary | ICD-10-CM | POA: Insufficient documentation

## 2013-09-17 DIAGNOSIS — Z8711 Personal history of peptic ulcer disease: Secondary | ICD-10-CM | POA: Insufficient documentation

## 2013-09-17 DIAGNOSIS — I2510 Atherosclerotic heart disease of native coronary artery without angina pectoris: Secondary | ICD-10-CM | POA: Insufficient documentation

## 2013-09-17 DIAGNOSIS — F17200 Nicotine dependence, unspecified, uncomplicated: Secondary | ICD-10-CM | POA: Insufficient documentation

## 2013-09-17 DIAGNOSIS — G43909 Migraine, unspecified, not intractable, without status migrainosus: Secondary | ICD-10-CM | POA: Insufficient documentation

## 2013-09-17 DIAGNOSIS — L988 Other specified disorders of the skin and subcutaneous tissue: Secondary | ICD-10-CM

## 2013-09-17 DIAGNOSIS — E785 Hyperlipidemia, unspecified: Secondary | ICD-10-CM | POA: Insufficient documentation

## 2013-09-17 DIAGNOSIS — I252 Old myocardial infarction: Secondary | ICD-10-CM | POA: Insufficient documentation

## 2013-09-17 DIAGNOSIS — R29898 Other symptoms and signs involving the musculoskeletal system: Secondary | ICD-10-CM | POA: Insufficient documentation

## 2013-09-17 DIAGNOSIS — F3289 Other specified depressive episodes: Secondary | ICD-10-CM | POA: Insufficient documentation

## 2013-09-17 DIAGNOSIS — K219 Gastro-esophageal reflux disease without esophagitis: Secondary | ICD-10-CM | POA: Insufficient documentation

## 2013-09-17 DIAGNOSIS — Z794 Long term (current) use of insulin: Secondary | ICD-10-CM | POA: Insufficient documentation

## 2013-09-17 DIAGNOSIS — E109 Type 1 diabetes mellitus without complications: Secondary | ICD-10-CM | POA: Insufficient documentation

## 2013-09-17 DIAGNOSIS — Z7902 Long term (current) use of antithrombotics/antiplatelets: Secondary | ICD-10-CM | POA: Insufficient documentation

## 2013-09-17 DIAGNOSIS — Z79899 Other long term (current) drug therapy: Secondary | ICD-10-CM | POA: Insufficient documentation

## 2013-09-17 DIAGNOSIS — R229 Localized swelling, mass and lump, unspecified: Secondary | ICD-10-CM | POA: Insufficient documentation

## 2013-09-17 DIAGNOSIS — F411 Generalized anxiety disorder: Secondary | ICD-10-CM | POA: Insufficient documentation

## 2013-09-17 DIAGNOSIS — I498 Other specified cardiac arrhythmias: Secondary | ICD-10-CM | POA: Insufficient documentation

## 2013-09-17 DIAGNOSIS — I679 Cerebrovascular disease, unspecified: Secondary | ICD-10-CM | POA: Insufficient documentation

## 2013-09-17 HISTORY — PX: HYDRADENITIS EXCISION: SHX5243

## 2013-09-17 HISTORY — PX: MASS EXCISION: SHX2000

## 2013-09-17 LAB — CBC
HCT: 34.6 % — ABNORMAL LOW (ref 36.0–46.0)
Hemoglobin: 11.7 g/dL — ABNORMAL LOW (ref 12.0–15.0)
MCH: 27.7 pg (ref 26.0–34.0)
MCHC: 33.8 g/dL (ref 30.0–36.0)
MCV: 81.8 fL (ref 78.0–100.0)
Platelets: 175 10*3/uL (ref 150–400)
RBC: 4.23 MIL/uL (ref 3.87–5.11)
RDW: 12.7 % (ref 11.5–15.5)
WBC: 7.7 10*3/uL (ref 4.0–10.5)

## 2013-09-17 LAB — TROPONIN I: Troponin I: <0.3 ng/mL (ref <0.30)

## 2013-09-17 LAB — CREATININE, SERUM
Creatinine, Ser: 0.67 mg/dL (ref 0.50–1.10)
GFR calc Af Amer: >90 mL/min (ref >90)
GFR calc non Af Amer: >90 mL/min (ref >90)

## 2013-09-17 LAB — GLUCOSE, CAPILLARY
Glucose-Capillary: 131 mg/dL — ABNORMAL HIGH (ref 70–99)
Glucose-Capillary: 119 mg/dL — ABNORMAL HIGH (ref 70–99)
Glucose-Capillary: 170 mg/dL — ABNORMAL HIGH (ref 70–99)
Glucose-Capillary: 206 mg/dL — ABNORMAL HIGH (ref 70–99)

## 2013-09-17 LAB — HEMOGLOBIN A1C
Hgb A1c MFr Bld: 9.5 % — ABNORMAL HIGH (ref <5.7)
Mean Plasma Glucose: 226 mg/dL — ABNORMAL HIGH (ref <117)

## 2013-09-17 SURGERY — EXCISION, HIDRADENITIS, INGUINAL REGION
Anesthesia: General | Site: Thigh | Laterality: Right

## 2013-09-17 MED ORDER — ALBUTEROL SULFATE (2.5 MG/3ML) 0.083% IN NEBU: 2.5000 mg | INHALATION_SOLUTION | Freq: Four times a day (QID) | RESPIRATORY_TRACT | Status: DC | PRN

## 2013-09-17 MED ORDER — HYDROMORPHONE HCL PF 1 MG/ML IJ SOLN
INTRAMUSCULAR | Status: AC
  Filled 2013-09-17: qty 1

## 2013-09-17 MED ORDER — POTASSIUM CHLORIDE IN NACL 20-0.45 MEQ/L-% IV SOLN
INTRAVENOUS | Status: DC
  Administered 2013-09-17 – 2013-09-19 (×3): via INTRAVENOUS
  Filled 2013-09-17 (×5): qty 1000

## 2013-09-17 MED ORDER — DIPHENHYDRAMINE HCL 25 MG PO TABS
25.0000 mg | ORAL_TABLET | Freq: Four times a day (QID) | ORAL | Status: DC | PRN
  Filled 2013-09-17: qty 1

## 2013-09-17 MED ORDER — CLOPIDOGREL BISULFATE 75 MG PO TABS
75.0000 mg | ORAL_TABLET | Freq: Every day | ORAL | Status: DC
  Administered 2013-09-18 – 2013-09-19 (×2): 75 mg via ORAL
  Filled 2013-09-17 (×2): qty 1

## 2013-09-17 MED ORDER — LABETALOL HCL 5 MG/ML IV SOLN
5.0000 mg | INTRAVENOUS | Status: DC
  Administered 2013-09-17: 5 mg via INTRAVENOUS

## 2013-09-17 MED ORDER — HEPARIN SODIUM (PORCINE) 5000 UNIT/ML IJ SOLN
5000.0000 [IU] | Freq: Once | INTRAMUSCULAR | Status: AC
  Administered 2013-09-17: 5000 [IU] via SUBCUTANEOUS
  Filled 2013-09-17: qty 1

## 2013-09-17 MED ORDER — BUPIVACAINE-EPINEPHRINE 0.5% -1:200000 IJ SOLN
INTRAMUSCULAR | Status: DC | PRN
  Administered 2013-09-17: 9 mL

## 2013-09-17 MED ORDER — FENTANYL CITRATE 0.05 MG/ML IJ SOLN
INTRAMUSCULAR | Status: AC
  Filled 2013-09-17: qty 2

## 2013-09-17 MED ORDER — INSULIN ASPART 100 UNIT/ML ~~LOC~~ SOLN
0.0000 [IU] | SUBCUTANEOUS | Status: DC
  Administered 2013-09-17: 3 [IU] via SUBCUTANEOUS
  Administered 2013-09-18: 2 [IU] via SUBCUTANEOUS
  Administered 2013-09-18: 3 [IU] via SUBCUTANEOUS
  Administered 2013-09-18: 2 [IU] via SUBCUTANEOUS
  Administered 2013-09-18: 3 [IU] via SUBCUTANEOUS
  Administered 2013-09-18: 2 [IU] via SUBCUTANEOUS
  Administered 2013-09-18: 3 [IU] via SUBCUTANEOUS
  Administered 2013-09-19: 2 [IU] via SUBCUTANEOUS
  Administered 2013-09-19 (×3): 3 [IU] via SUBCUTANEOUS

## 2013-09-17 MED ORDER — MORPHINE SULFATE 2 MG/ML IJ SOLN: 1.0000 mg | INTRAMUSCULAR | Status: DC | PRN

## 2013-09-17 MED ORDER — DIPHENHYDRAMINE HCL 25 MG PO CAPS
25.0000 mg | ORAL_CAPSULE | Freq: Four times a day (QID) | ORAL | Status: DC | PRN
  Administered 2013-09-17 – 2013-09-18 (×3): 25 mg via ORAL
  Filled 2013-09-17 (×3): qty 1

## 2013-09-17 MED ORDER — HYDROMORPHONE HCL PF 1 MG/ML IJ SOLN
0.2500 mg | INTRAMUSCULAR | Status: DC | PRN
  Administered 2013-09-17 (×2): 0.5 mg via INTRAVENOUS

## 2013-09-17 MED ORDER — LACTATED RINGERS IV SOLN
INTRAVENOUS | Status: DC | PRN
  Administered 2013-09-17: 15:00:00
  Administered 2013-09-17: 13:00:00 via INTRAVENOUS

## 2013-09-17 MED ORDER — DEXTROSE 5 % IV SOLN
INTRAVENOUS | Status: AC
  Filled 2013-09-17: qty 2

## 2013-09-17 MED ORDER — DIPHENHYDRAMINE HCL 50 MG/ML IJ SOLN: 12.5000 mg | INTRAMUSCULAR | Status: DC

## 2013-09-17 MED ORDER — AMLODIPINE BESYLATE 5 MG PO TABS
5.0000 mg | ORAL_TABLET | Freq: Once | ORAL | Status: AC
  Administered 2013-09-17: 5 mg via ORAL
  Filled 2013-09-17: qty 1

## 2013-09-17 MED ORDER — NITROGLYCERIN 0.4 MG SL SUBL
0.4000 mg | SUBLINGUAL_TABLET | SUBLINGUAL | Status: DC | PRN
  Administered 2013-09-17: 0.4 mg via SUBLINGUAL

## 2013-09-17 MED ORDER — FENTANYL CITRATE 0.05 MG/ML IJ SOLN
INTRAMUSCULAR | Status: AC
  Filled 2013-09-17: qty 5

## 2013-09-17 MED ORDER — MEPERIDINE HCL 50 MG/ML IJ SOLN
12.5000 mg | Freq: Once | INTRAMUSCULAR | Status: AC
  Administered 2013-09-17: 12.5 mg via INTRAVENOUS

## 2013-09-17 MED ORDER — BUPIVACAINE-EPINEPHRINE (PF) 0.5% -1:200000 IJ SOLN
INTRAMUSCULAR | Status: AC
  Filled 2013-09-17: qty 30

## 2013-09-17 MED ORDER — DOXYCYCLINE HYCLATE 100 MG PO TABS
100.0000 mg | ORAL_TABLET | Freq: Every day | ORAL | Status: DC
  Administered 2013-09-17 – 2013-09-19 (×3): 100 mg via ORAL
  Filled 2013-09-17 (×3): qty 1

## 2013-09-17 MED ORDER — ALPRAZOLAM 0.5 MG PO TABS
0.5000 mg | ORAL_TABLET | Freq: Every evening | ORAL | Status: DC | PRN
  Administered 2013-09-18: 0.5 mg via ORAL
  Filled 2013-09-17: qty 1

## 2013-09-17 MED ORDER — SUMATRIPTAN SUCCINATE 50 MG PO TABS: 50.0000 mg | ORAL_TABLET | ORAL | Status: DC | PRN

## 2013-09-17 MED ORDER — ONDANSETRON HCL 4 MG/2ML IJ SOLN
INTRAMUSCULAR | Status: DC | PRN
  Administered 2013-09-17: 4 mg via INTRAVENOUS

## 2013-09-17 MED ORDER — FENTANYL CITRATE 0.05 MG/ML IJ SOLN
INTRAMUSCULAR | Status: DC | PRN
  Administered 2013-09-17: 25 ug via INTRAVENOUS
  Administered 2013-09-17: 50 ug via INTRAVENOUS
  Administered 2013-09-17 (×7): 25 ug via INTRAVENOUS

## 2013-09-17 MED ORDER — PANTOPRAZOLE SODIUM 40 MG PO TBEC
80.0000 mg | DELAYED_RELEASE_TABLET | Freq: Every day | ORAL | Status: DC
  Administered 2013-09-17 – 2013-09-19 (×3): 80 mg via ORAL
  Filled 2013-09-17 (×3): qty 2

## 2013-09-17 MED ORDER — NITROGLYCERIN 0.4 MG SL SUBL
SUBLINGUAL_TABLET | SUBLINGUAL | Status: AC
  Filled 2013-09-17: qty 1

## 2013-09-17 MED ORDER — HEPARIN SODIUM (PORCINE) 5000 UNIT/ML IJ SOLN
5000.0000 [IU] | Freq: Three times a day (TID) | INTRAMUSCULAR | Status: DC
  Administered 2013-09-18 – 2013-09-19 (×5): 5000 [IU] via SUBCUTANEOUS
  Filled 2013-09-17 (×7): qty 1

## 2013-09-17 MED ORDER — PROMETHAZINE HCL 25 MG PO TABS
25.0000 mg | ORAL_TABLET | Freq: Four times a day (QID) | ORAL | Status: DC | PRN
  Administered 2013-09-17 – 2013-09-18 (×2): 25 mg via ORAL
  Filled 2013-09-17 (×2): qty 1

## 2013-09-17 MED ORDER — FENTANYL CITRATE 0.05 MG/ML IJ SOLN
25.0000 ug | INTRAMUSCULAR | Status: DC | PRN
  Administered 2013-09-17 (×2): 50 ug via INTRAVENOUS

## 2013-09-17 MED ORDER — IRBESARTAN 300 MG PO TABS
300.0000 mg | ORAL_TABLET | Freq: Every day | ORAL | Status: DC
  Administered 2013-09-18 – 2013-09-19 (×2): 300 mg via ORAL
  Filled 2013-09-17 (×2): qty 1

## 2013-09-17 MED ORDER — HYDROCHLOROTHIAZIDE 12.5 MG PO CAPS
12.5000 mg | ORAL_CAPSULE | Freq: Every day | ORAL | Status: DC
  Administered 2013-09-18: 12.5 mg via ORAL
  Filled 2013-09-17: qty 1

## 2013-09-17 MED ORDER — BUPIVACAINE LIPOSOME 1.3 % IJ SUSP
20.0000 mL | Freq: Once | INTRAMUSCULAR | Status: DC
Start: 2013-09-17 — End: 2013-09-17
  Filled 2013-09-17: qty 20

## 2013-09-17 MED ORDER — AMLODIPINE BESYLATE 5 MG PO TABS
5.0000 mg | ORAL_TABLET | Freq: Every day | ORAL | Status: DC
  Administered 2013-09-18 – 2013-09-19 (×2): 5 mg via ORAL
  Filled 2013-09-17 (×2): qty 1

## 2013-09-17 MED ORDER — OLMESARTAN-AMLODIPINE-HCTZ 40-5-12.5 MG PO TABS: 1.0000 | ORAL_TABLET | Freq: Every morning | ORAL | Status: DC

## 2013-09-17 MED ORDER — PROMETHAZINE HCL 25 MG/ML IJ SOLN: 6.2500 mg | INTRAMUSCULAR | Status: DC | PRN

## 2013-09-17 MED ORDER — MEPERIDINE HCL 50 MG/ML IJ SOLN
INTRAMUSCULAR | Status: AC
  Filled 2013-09-17: qty 1

## 2013-09-17 MED ORDER — HYDROCODONE-ACETAMINOPHEN 5-325 MG PO TABS
1.0000 | ORAL_TABLET | ORAL | Status: DC | PRN
  Administered 2013-09-17 – 2013-09-18 (×3): 1 via ORAL
  Filled 2013-09-17 (×3): qty 1

## 2013-09-17 MED ORDER — ONDANSETRON HCL 4 MG/2ML IJ SOLN
INTRAMUSCULAR | Status: AC
  Filled 2013-09-17: qty 2

## 2013-09-17 MED ORDER — NITROGLYCERIN 0.4 MG SL SUBL
0.4000 mg | SUBLINGUAL_TABLET | SUBLINGUAL | Status: DC | PRN
  Administered 2013-09-18: 0.4 mg via SUBLINGUAL

## 2013-09-17 MED ORDER — DEXTROSE 5 % IV SOLN
2.0000 g | INTRAVENOUS | Status: AC
  Administered 2013-09-17: 2 g via INTRAVENOUS

## 2013-09-17 MED ORDER — LABETALOL HCL 5 MG/ML IV SOLN
INTRAVENOUS | Status: AC
  Filled 2013-09-17: qty 4

## 2013-09-17 MED ORDER — PROPOFOL 10 MG/ML IV BOLUS
INTRAVENOUS | Status: AC
  Filled 2013-09-17: qty 20

## 2013-09-17 MED ORDER — PROPOFOL 10 MG/ML IV BOLUS
INTRAVENOUS | Status: DC | PRN
  Administered 2013-09-17: 150 mg via INTRAVENOUS

## 2013-09-17 SURGICAL SUPPLY — 45 items
BENZOIN TINCTURE PRP APPL 2/3 (GAUZE/BANDAGES/DRESSINGS) IMPLANT
BLADE HEX COATED 2.75 (ELECTRODE) ×3 IMPLANT
BLADE SURG 15 STRL LF DISP TIS (BLADE) IMPLANT
BLADE SURG 15 STRL SS (BLADE)
BLADE SURG ROTATE 9660 (MISCELLANEOUS) IMPLANT
BLADE SURG SZ10 CARB STEEL (BLADE) ×6 IMPLANT
CANISTER SUCTION 1200CC (MISCELLANEOUS) IMPLANT
CANISTER SUCTION 2500CC (MISCELLANEOUS) IMPLANT
CHLORAPREP W/TINT 26ML (MISCELLANEOUS) IMPLANT
DECANTER SPIKE VIAL GLASS SM (MISCELLANEOUS) IMPLANT
DERMABOND ADVANCED (GAUZE/BANDAGES/DRESSINGS) ×1
DERMABOND ADVANCED .7 DNX12 (GAUZE/BANDAGES/DRESSINGS) ×2 IMPLANT
DRAPE LAPAROTOMY T 102X78X121 (DRAPES) IMPLANT
DRAPE LAPAROTOMY TRNSV 102X78 (DRAPE) IMPLANT
DRAPE LG THREE QUARTER DISP (DRAPES) IMPLANT
DRAPE PED LAPAROTOMY (DRAPES) ×3 IMPLANT
DRAPE UTILITY XL STRL (DRAPES) ×3 IMPLANT
ELECT REM PT RETURN 9FT ADLT (ELECTROSURGICAL) ×3
ELECTRODE REM PT RTRN 9FT ADLT (ELECTROSURGICAL) ×2 IMPLANT
GLOVE BIOGEL M 8.0 STRL (GLOVE) ×3 IMPLANT
GLOVE BIOGEL PI IND STRL 7.0 (GLOVE) ×2 IMPLANT
GLOVE BIOGEL PI INDICATOR 7.0 (GLOVE) ×1
GOWN STRL REUS W/ TWL XL LVL3 (GOWN DISPOSABLE) ×4 IMPLANT
GOWN STRL REUS W/TWL LRG LVL3 (GOWN DISPOSABLE) ×3 IMPLANT
GOWN STRL REUS W/TWL XL LVL3 (GOWN DISPOSABLE) ×8 IMPLANT
KIT BASIN OR (CUSTOM PROCEDURE TRAY) ×3 IMPLANT
MARKER SKIN DUAL TIP RULER LAB (MISCELLANEOUS) IMPLANT
NEEDLE HYPO 25X1 1.5 SAFETY (NEEDLE) ×3 IMPLANT
NS IRRIG 1000ML POUR BTL (IV SOLUTION) ×3 IMPLANT
PACK BASIC VI WITH GOWN DISP (CUSTOM PROCEDURE TRAY) ×3 IMPLANT
PACK GENERAL/GYN (CUSTOM PROCEDURE TRAY) ×6 IMPLANT
PAD ABD 7.5X8 STRL (GAUZE/BANDAGES/DRESSINGS) ×3 IMPLANT
PENCIL BUTTON HOLSTER BLD 10FT (ELECTRODE) ×3 IMPLANT
SPONGE GAUZE 4X4 12PLY (GAUZE/BANDAGES/DRESSINGS) ×3 IMPLANT
SPONGE LAP 18X18 X RAY DECT (DISPOSABLE) IMPLANT
SPONGE LAP 4X18 X RAY DECT (DISPOSABLE) ×3 IMPLANT
STAPLER VISISTAT 35W (STAPLE) IMPLANT
SUT SILK 2 0 SH (SUTURE) ×3 IMPLANT
SUT VIC AB 4-0 SH 18 (SUTURE) ×3 IMPLANT
SYR CONTROL 10ML LL (SYRINGE) ×3 IMPLANT
TOWEL OR 17X26 10 PK STRL BLUE (TOWEL DISPOSABLE) ×3 IMPLANT
TOWEL OR NON WOVEN STRL DISP B (DISPOSABLE) ×3 IMPLANT
WATER STERILE IRR 1000ML POUR (IV SOLUTION) ×3 IMPLANT
YANKAUER SUCT BULB TIP 10FT TU (MISCELLANEOUS) IMPLANT
YANKAUER SUCT BULB TIP NO VENT (SUCTIONS) ×3 IMPLANT

## 2013-09-17 NOTE — Anesthesia Postprocedure Evaluation (Addendum)
  Anesthesia Post-op Note  Patient: [name]  Procedure(s) Performed: Procedure(s) (LRB): EXCISION PERINEAL HIDRADENITIS  (N/A) EXCISION MASS (Right)  Patient Location: PACU  Anesthesia Type: General  Level of Consciousness: awake and alert   Airway and Oxygen Therapy: Patient Spontanous Breathing  Post-op Pain: mild  Post-op Assessment: Post-op Vital signs reviewed, Patient's Cardiovascular Status Stable, Respiratory Function Stable, Patent Airway and No signs of Nausea or vomiting  Last Vitals:  Filed Vitals:   09/17/13 1642  BP: 144/58  Pulse: 68  Temp:   Resp: 13    Post-op Vital Signs: stable   Complications: She had 0/53 substernal chest pain in the PACU. A stat ECG, troponin, and nitroglycerine SL ordered. Labetolol ordered for elevated BP. ECG without acute ischemic changes. Troponin less than 0.30 and chest pain resolved after one nitroglycerine. She has seen Dr. Doylene Canard in the past (note from 2013 admission) but states she currently doesn't see a cardiologist. She put nitropaste on at home last week with good result.  Discussed with Dr. Hassell Done. Plan to send to telemetry and observe serial troponins.

## 2013-09-17 NOTE — Anesthesia Preprocedure Evaluation (Addendum)
Anesthesia Evaluation  Patient identified by MRN, date of birth, ID band Patient awake    Reviewed: Allergy & Precautions, H&P , NPO status , Patient's Chart, lab work & pertinent test results  History of Anesthesia Complications (+) history of anesthetic complications  Airway Mallampati: II TM Distance: >3 FB Neck ROM: Full    Dental  (+) Edentulous Upper, Edentulous Lower   Pulmonary asthma , pneumonia -, COPD COPD inhaler, former smoker,  breath sounds clear to auscultation  Pulmonary exam normal       Cardiovascular hypertension, Pt. on medications + angina + Past MI Rhythm:Regular Rate:Normal     Neuro/Psych  Headaches, Anxiety CVA    GI/Hepatic Neg liver ROS, hiatal hernia, PUD, GERD-  Medicated,  Endo/Other  diabetes, Type 1, Insulin Dependent  Renal/GU negative Renal ROS  negative genitourinary   Musculoskeletal negative musculoskeletal ROS (+)   Abdominal   Peds negative pediatric ROS (+)  Hematology  (+) anemia ,   Anesthesia Other Findings   Reproductive/Obstetrics negative OB ROS                          Anesthesia Physical Anesthesia Plan  ASA: III  Anesthesia Plan: General   Post-op Pain Management:    Induction: Intravenous  Airway Management Planned: Oral ETT  Additional Equipment:   Intra-op Plan:   Post-operative Plan: Extubation in OR  Informed Consent: I have reviewed the patients History and Physical, chart, labs and discussed the procedure including the risks, benefits and alternatives for the proposed anesthesia with the patient or authorized representative who has indicated his/her understanding and acceptance.   Dental advisory given  Plan Discussed with: CRNA  Anesthesia Plan Comments:         Anesthesia Quick Evaluation

## 2013-09-17 NOTE — Op Note (Signed)
Surgeon: Kaylyn Lim, MD, FACS  Asst:  none  Anes:  general  Procedure: Excision and closure of tender, palpable mass of right thigh (frozen-dystrophic calcification);  Excision of 3 areas of hidradenitis of perineum  Diagnosis: Hidradenitis of the perineum  Complications: none  EBL:   minimal cc  Description of Procedure:  Taken to OR 1.  Leg and perineum prepped with PCMX.  Timeout performed.  I excised one of the nodule on the right thigh.  It was very hard and stuck to the surrounding tissue.  Probable dystrophic calcification.  Calcification also involves the underlying tenso fascia lata.  Closed with 4-0 vicryl and Dermabond.    The perineal hidradenitis was managed by eliptical excisions of an area on her mons which was cauterized and closed with 4-0 vicryl.  It was injected with marcaine with epi.  The area along the left labia laterally was excised with a single long ellipse and approximated with vicryl.  It too was injected with marcaine with epi. Sterile dressing and mesh panties applied.  Admitted for observation because she lives alone and has no one to care for her.   Matt B. Hassell Done, [place], [facility], [place]

## 2013-09-17 NOTE — Interval H&P Note (Signed)
History and Physical Interval Note:  09/17/2013 1:23 PM  [name]  has presented today for surgery, with the diagnosis of painful masses  The various methods of treatment have been discussed with the patient and family. After consideration of risks, benefits and other options for treatment, the patient has consented to  Procedure(s): EXCISION PERINEAL HIDRADENITIS  (N/A) EXCISION MASS (Right) as a surgical intervention .  The patient's history has been reviewed, patient examined, no change in status, stable for surgery.  I have reviewed the patient's chart and labs.  Questions were answered to the patient's satisfaction.     Pedro Earls

## 2013-09-17 NOTE — H&P (View-Only) (Signed)
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  She has developed painful lumpy masses in her right lateral thigh.  These are easily palpable.  In addition she is having new areas that have opened up on her mons and the left medial thigh.   Past Medical History  Diagnosis Date  . Hypertension   . Anxiety   . Hyperlipidemia   . GERD (gastroesophageal reflux disease)   . Irritable bowel syndrome   . Sinus problem   . Hidradenitis     groin  . Anemia   . Schatzki's ring   . Diverticulosis of colon with hemorrhage April 2013  . Stomach ulcer 1972    non-bleeding  . Chronic asthmatic bronchitis   . Obesity   . Heart attack     2003 mild MI, March 2013 mild MI  . Left-sided weakness     since stroke, left eye trouble seeing  . Cancer 1985    ovarian, no treatment except surgery  . H/O blood transfusion reaction 1985, April 2013  . Anginal pain   . Pneumonia ~ 2011  . Chronic bronchitis     "I get it q yr" (09/14/2012)  . Type II diabetes mellitus   . H/O hiatal hernia   . Chronic headaches     "daily lately; sometimes monthly" (09/14/2012)  . Migraines   . Stroke 2009, 2010, 2011    total 3 strokes  . Arthritis     "neck, left hand" (09/14/2012)  . Depression     Past Surgical History  Procedure Laterality Date  . Hiatal hernia repair    . Breast reduction surgery    . Axillary hidradenitis excision  1990-2008    bilateral  . Hydradenitis excision  01/2011; 03/2012    'groin and abdomen; 03/2012" (09/14/2012)  . Anterior cervical decomp/discectomy fusion  2002  . Colonoscopy  08/12/2011    Procedure: COLONOSCOPY;  Surgeon: Malcolm T Stark, MD,FACG;  Location: [facility];  Service: Endoscopy;  Laterality: N/A;  . Esophagogastroduodenoscopy  08/12/2011    Procedure: ESOPHAGOGASTRODUODENOSCOPY (EGD);  Surgeon:  Malcolm T Stark, MD,FACG;  Location: [facility];  Service: Endoscopy;  Laterality: N/A;  . Givens capsule study  08/13/2011    Procedure: GIVENS CAPSULE STUDY;  Surgeon: Malcolm T Stark, MD,FACG;  Location: [facility];  Service: Endoscopy;  Laterality: N/A;  . Nissen fundoplication  1996  . Uvulopalatopharyngoplasty, tonsillectomy and septoplasty  2000's  . Hammer toe surgery Bilateral ~ 2000  . Colonoscopy  06/19/2006  . Esophagogastroduodenoscopy  06/04/2005  . Cardiac catheterization  2004    mild disease  . Back surgery    . Abdominal hysterectomy  1985  . Cholecystectomy  1990's  . Hydradenitis excision  04/01/2012    Procedure: EXCISION HYDRADENITIS GROIN;  Surgeon: Parthiv Mucci B Madelyn Tlatelpa, MD;  Location: [facility];  Service: General;  Laterality: Bilateral;  Excision of Hydradenitis of Perineum  . Tonsillectomy and adenoidectomy  1959  . Appendectomy  1985  . Breast cyst excision Right 2008  . Reduction mammaplasty  1996?  . Breast biopsy Right 2007  . Posterior lumbar fusion  2008 X 2    Current Outpatient Prescriptions  Medication Sig Dispense Refill  . acetaminophen (TYLENOL) 500 MG tablet Take 500 mg by mouth every 6 (six) hours as needed for pain.      .   albuterol (PROVENTIL HFA;VENTOLIN HFA) 108 (90 BASE) MCG/ACT inhaler Inhale 2 puffs into the lungs every 6 (six) hours as needed for wheezing.      Marland Kitchen ALPRAZolam (XANAX) 0.5 MG tablet Take 0.5 mg by mouth at bedtime as needed. For anxiety      . BAYER CONTOUR TEST test strip       . clopidogrel (PLAVIX) 75 MG tablet Take 75 mg by mouth daily.      Marland Kitchen dicyclomine (BENTYL) 20 MG tablet Take 1 tablet (20 mg total) by mouth 3 (three) times daily before meals.  90 tablet  1  . diphenhydrAMINE (BENADRYL) 25 MG tablet Take 25 mg by mouth every 6 (six) hours as needed for itching. For itching or taking certain medications that make her itch.      . doxycycline (VIBRA-TABS) 100 MG tablet Take 1 tablet (100 mg total) by mouth daily.  21 tablet   2  . esomeprazole (NEXIUM) 40 MG capsule Take 40 mg by mouth daily before breakfast.      . fluconazole (DIFLUCAN) 200 MG tablet Take 1 tablet (200 mg total) by mouth daily.  3 tablet  2  . insulin aspart (NOVOLOG) 100 UNIT/ML injection Inject into the skin 3 (three) times daily with meals.      . Insulin Lispro Prot & Lispro (HUMALOG MIX 75/25 KWIKPEN [place]) Inject into the skin.      Marland Kitchen loratadine (CLARITIN) 10 MG tablet Take 10 mg by mouth daily as needed for allergies.      . mometasone (NASONEX) 50 MCG/ACT nasal spray Place 1 spray into the nose daily as needed. For allergies      . mupirocin cream (BACTROBAN) 2 % Apply 1 application topically 3 (three) times daily.  15 g  0  . nitroGLYCERIN (NITRODUR - DOSED IN MG/24 HR) 0.4 mg/hr Place 1 patch onto the skin as needed. Chest pains      . nitroGLYCERIN (NITROSTAT) 0.4 MG SL tablet Place 0.4 mg under the tongue every 5 (five) minutes as needed. Chest pains      . Olmesartan-Amlodipine-HCTZ (TRIBENZOR) 40-5-12.5 MG TABS Take 1 tablet by mouth every morning.       . ondansetron (ZOFRAN-ODT) 8 MG disintegrating tablet Take 1 tablet (8 mg total) by mouth 3 (three) times daily before meals.  40 tablet  1  . Pancrelipase, Lip-Prot-Amyl, (CREON) 36000 UNITS CPEP Take 1 capsule by mouth 3 (three) times daily with meals.  90 capsule  1  . potassium chloride SA (K-DUR,KLOR-CON) 20 MEQ tablet Take 20 mEq by mouth daily as needed (for cramps).      . promethazine (PHENERGAN) 25 MG tablet Take 1 tablet (25 mg total) by mouth every 8 (eight) hours as needed for nausea.  30 tablet  0  . rosuvastatin (CRESTOR) 20 MG tablet Take 20 mg by mouth every morning.       . SUMAtriptan (IMITREX) 50 MG tablet Take 50 mg by mouth every 2 (two) hours as needed for migraine.      . traMADol (ULTRAM) 50 MG tablet Take 100 mg by mouth every 6 (six) hours as needed for pain.      . TRUEPLUS SAFETY LANCETS 28G MISC       . zolpidem (AMBIEN) 10 MG tablet Take 5 mg by mouth at  bedtime as needed. FOR SLEEP      . oxyCODONE (OXY IR/ROXICODONE) 5 MG immediate release tablet Take 1 tablet (5 mg total) by mouth every 6 (  six) hours as needed for pain.  30 tablet  0   No current facility-administered medications for this visit.   Bactrim; Sulfa antibiotics; Iodine; Metformin and related; Adhesive; and Shellfish allergy Family History  Problem Relation Age of Onset  . Breast cancer Mother   . Heart disease Mother   . Hypertension Mother   . Diabetes Father   . Heart disease Father   . Stroke Father   . Heart disease Sister   . Hypertension Sister   . Hypertension Sister   . Hyperlipidemia Sister   . Diabetes Sister   . Emphysema      great uncle  . Aneurysm Sister     brain   Social History:   reports that she has been smoking Cigarettes.  She has a 13.53 pack-year smoking history. She has never used smokeless tobacco. She reports that she does not drink alcohol or use illicit drugs.   REVIEW OF SYSTEMS - PERTINENT POSITIVES ONLY: See above  Physical Exam:   Blood pressure 120/82, pulse 72, temperature 97.6 F (36.4 C), resp. rate 18, height 5' 4" (1.626 m), weight 144 lb 12.8 oz (65.681 kg). Body mass index is 24.84 kg/(m^2).  Gen:  WDWN AAF NAD  Neurological: Alert and oriented to person, place, and time. Motor and sensory function is grossly intact  Head: Normocephalic and atraumatic.  Eyes: Conjunctivae are normal. Pupils are equal, round, and reactive to light. No scleral icterus.  Neck: Normal range of motion. Neck supple. No tracheal deviation or thyromegaly present.  Cardiovascular:  SR without murmurs or gallops.  No carotid bruits Respiratory: Effort normal.  No respiratory distress. No chest wall tenderness. Breath sounds normal.  No wheezes, rales or rhonchi.  Abdomen:  unremarkable GU: draining pustule on the mons, tender area on medial thigh;  Right lateral thigh are palpable masses consistent with lipodystrophy Musculoskeletal: Normal  range of motion. Extremities are nontender. No cyanosis, edema or clubbing noted Lymphadenopathy: No cervical, preauricular, postauricular or axillary adenopathy is present Skin: Skin is warm and dry. No rash noted. No diaphoresis. No erythema. No pallor. Pscyh: Normal mood and affect. Behavior is normal. Judgment and thought content normal.   LABORATORY RESULTS: No results found for this or any previous visit (from the past 48 hour(s)).  RADIOLOGY RESULTS: No results found.  Problem List: Patient Active Problem List   Diagnosis Date Noted  . Pancreatitis 01/02/2013  . Abdominal pain, epigastric 12/14/2012  . IBS (irritable bowel syndrome) 09/12/2011  . GERD (gastroesophageal reflux disease) 09/12/2011  . Diverticulosis of colon with hemorrhage 08/10/2011  . Chest pain at rest 07/06/2011    Class: Acute  . Hidradenitis, left pubic area and left lower quadrant 01/18/2011  . DIABETES MELLITUS 08/24/2007  . DYSLIPIDEMIA 08/24/2007  . 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  Plan excision of both areas.     Matt B. Saadiq Poche, MD, FACS  [facility] [phone] beeper [phone]  09/02/2013 11:03 AM     

## 2013-09-17 NOTE — Transfer of Care (Signed)
Immediate Anesthesia Transfer of Care Note  Patient: [name]  Procedure(s) Performed: Procedure(s) with comments: EXCISION PERINEAL HIDRADENITIS  (N/A) - also in the pubis area EXCISION MASS (Right)  Patient Location: PACU  Anesthesia Type:General  Level of Consciousness: awake, alert  and patient cooperative  Airway & Oxygen Therapy: Patient Spontanous Breathing and Patient connected to face mask oxygen  Post-op Assessment: Report given to PACU RN and Post -op Vital signs reviewed and stable  Post vital signs: Reviewed and stable  Complications: No apparent anesthesia complications

## 2013-09-18 LAB — GLUCOSE, CAPILLARY
Glucose-Capillary: 165 mg/dL — ABNORMAL HIGH (ref 70–99)
Glucose-Capillary: 178 mg/dL — ABNORMAL HIGH (ref 70–99)
Glucose-Capillary: 186 mg/dL — ABNORMAL HIGH (ref 70–99)
Glucose-Capillary: 224 mg/dL — ABNORMAL HIGH (ref 70–99)
Glucose-Capillary: 238 mg/dL — ABNORMAL HIGH (ref 70–99)
Glucose-Capillary: 240 mg/dL — ABNORMAL HIGH (ref 70–99)

## 2013-09-18 LAB — TROPONIN I: Troponin I: <0.3 ng/mL (ref <0.30)

## 2013-09-18 LAB — CK TOTAL AND CKMB (NOT AT ARMC)
CK, MB: 1.4 ng/mL (ref 0.3–4.0)
Relative Index: INVALID (ref 0.0–2.5)
Total CK: 93 U/L (ref 7–177)

## 2013-09-18 LAB — CBC
HCT: 34.6 % — ABNORMAL LOW (ref 36.0–46.0)
Hemoglobin: 11.5 g/dL — ABNORMAL LOW (ref 12.0–15.0)
MCH: 27.8 pg (ref 26.0–34.0)
MCHC: 33.2 g/dL (ref 30.0–36.0)
MCV: 83.6 fL (ref 78.0–100.0)
Platelets: 163 10*3/uL (ref 150–400)
RBC: 4.14 MIL/uL (ref 3.87–5.11)
RDW: 12.7 % (ref 11.5–15.5)
WBC: 5.6 10*3/uL (ref 4.0–10.5)

## 2013-09-18 LAB — BASIC METABOLIC PANEL
BUN: 11 mg/dL (ref 6–23)
CO2: 27 mEq/L (ref 19–32)
Calcium: 9.5 mg/dL (ref 8.4–10.5)
Chloride: 102 mEq/L (ref 96–112)
Creatinine, Ser: 0.75 mg/dL (ref 0.50–1.10)
GFR calc Af Amer: >90 mL/min (ref >90)
GFR calc non Af Amer: >90 mL/min (ref >90)
Glucose, Bld: 190 mg/dL — ABNORMAL HIGH (ref 70–99)
Potassium: 4.3 mEq/L (ref 3.7–5.3)
Sodium: 140 mEq/L (ref 137–147)

## 2013-09-18 MED ORDER — METOPROLOL TARTRATE 12.5 MG HALF TABLET
12.5000 mg | ORAL_TABLET | Freq: Two times a day (BID) | ORAL | Status: DC
  Administered 2013-09-18 – 2013-09-19 (×2): 12.5 mg via ORAL
  Filled 2013-09-18 (×3): qty 1

## 2013-09-18 MED ORDER — FLUTICASONE PROPIONATE 50 MCG/ACT NA SUSP
1.0000 | Freq: Every day | NASAL | Status: DC
  Administered 2013-09-18 – 2013-09-19 (×2): 1 via NASAL
  Filled 2013-09-18: qty 16

## 2013-09-18 NOTE — Progress Notes (Signed)
Patient ID: [name], female   DOB: 07/20/1955, 58 y.o.   MRN: [id number] 1 Day Post-Op  Subjective: Chief complaint this morning is continued substernal chest pain. She states that this is similar to angina that she's had in the past. She did have some angina last week that resolved after nitroglycerin patch but had some chest pain postoperatively yesterday. EKG at that time was unremarkable except for PACs and a single set of cardiac enzymes were negative. She felt some better yesterday but again has chest pain this morning. Feels a little nauseated. Minimal discomfort from her surgical sites.  Objective: Vital signs in last 24 hours: Temp:  [98.1 F (36.7 C)-98.5 F (36.9 C)] 98.1 F (36.7 C) (05/16 0428) Pulse Rate:  [53-92] 60 (05/16 0428) Resp:  [12-19] 16 (05/16 0428) BP: (129-189)/(54-100) 129/57 mmHg (05/16 0428) SpO2:  [94 %-100 %] 97 % (05/16 0428) Weight:  [151 lb 14.4 oz (68.9 kg)] 151 lb 14.4 oz (68.9 kg) (05/15 1700) Last BM Date: 09/16/13  Intake/Output from previous day: 05/15 0701 - 05/16 0700 In: 1570.8 [I.V.:1570.8] Out: 50 [Blood:50] Intake/Output this shift:    General appearance: alert, cooperative and no distress Cardio: irregular rhythm. No edema. Incision/Wound: wounds clean with minimal drainage  Telemetry shows PACs  Lab Results:   Recent Labs  09/17/13 1900 09/18/13 0500  WBC 7.7 5.6  HGB 11.7* 11.5*  HCT 34.6* 34.6*  PLT 175 163   BMET  Recent Labs  09/17/13 1900 09/18/13 0425  NA  --  140  K  --  4.3  CL  --  102  CO2  --  27  GLUCOSE  --  190*  BUN  --  11  CREATININE 0.67 0.75  CALCIUM  --  9.5     Studies/Results: No results found.  Anti-infectives: Anti-infectives   Start     Dose/Rate Route Frequency Ordered Stop   09/17/13 2000  doxycycline (VIBRA-TABS) tablet 100 mg     100 mg Oral Daily 09/17/13 1801     09/17/13 1036  cefOXitin (MEFOXIN) 2 g in dextrose 5 % 50 mL IVPB     2 g 100 mL/hr over 30 Minutes  Intravenous On call to O.R. 09/17/13 1036 09/17/13 1342      Assessment/Plan: s/p Procedure(s): EXCISION PERINEAL HIDRADENITIS  EXCISION MASS Surgical sites okay. Some ongoing chest pain with history of coronary artery disease. Discussed with cardiology who will evaluate.   LOS: 1 day    Edward Jolly 09/18/2013

## 2013-09-18 NOTE — Consult Note (Signed)
Reason for Consult: Recurrent chest pain  Referring Physician: Dr.  [name] is an 58 y.o. female.  HPI: Patient is 58 year old female with past medical history significant for mild coronary artery disease hypertension, insulin requiring diabetes mellitus, history of CVA, cerebrovascular disease, history of diverticular GI bleed in the past, height this hernia, tobacco abuse, hypercholesteremia, history of remote cocaine abuse, underwent resection of hidradenitis of perineum yesterday patient complained of retrosternal chest pain described as and when sitting on the chest in the recovery room received sublingual nitroglycerin with relief of chest pain. Patient states she had similar pain approximately one week ago associated with diaphoresis took Nitropatch with relief of chest pain. Chest pain is localized and not related to food breathing or coughing. Patient denies any exertional chest pain states she climbs 15 steps at her apartment off and on without any problems. Patient had cardiac cath in the past which showed mild coronary artery disease and also had subsequently to nuclear stress test in February of 2000 03/14/2012 which were negative for ischemia. EKG and then postoperatively showed no acute ischemic changes. Patient presently denies any chest pain shortness of breath. Denies any palpitation lightheadedness or syncope denies PND orthopnea leg swelling.  Past Medical History  Diagnosis Date  . Hypertension   . Anxiety   . Hyperlipidemia   . GERD (gastroesophageal reflux disease)   . Irritable bowel syndrome   . Sinus problem   . Hidradenitis     groin  . Anemia   . Schatzki's ring   . Diverticulosis of colon with hemorrhage April 2013  . Stomach ulcer 1972    non-bleeding  . Chronic asthmatic bronchitis   . Obesity   . Left-sided weakness     since stroke, left eye trouble seeing  . Cancer 1985    ovarian, no treatment except surgery  . Anginal pain   . Pneumonia ~  2011  . Chronic bronchitis     "I get it q yr" (09/14/2012)  . Type II diabetes mellitus   . H/O hiatal hernia   . Chronic headaches     "daily lately; sometimes monthly" (09/14/2012)  . Migraines   . Arthritis     "neck, left hand" (09/14/2012)  . Chest pain 09-11-2013    LEFT SIDED  . Heart attack     2003 mild MI, March 2013 mild MI  . Stroke 07-2007, 07-2008, 07-2009    total 3 strokes  . History of blood transfusion 1985 AND 2013  . Complication of anesthesia     OCCASIONAL TROUBLE TURNING NECK TO RIGHT  . Vitamin B 12 deficiency 07-15-2013    Past Surgical History  Procedure Laterality Date  . Hiatal hernia repair    . Breast reduction surgery    . Axillary hidradenitis excision  1990-2008    bilateral  . Hydradenitis excision  01/2011; 03/2012    'groin and abdomen; 03/2012" (09/14/2012)  . Anterior cervical decomp/discectomy fusion  2002  . Colonoscopy  08/12/2011    Procedure: COLONOSCOPY;  Surgeon: Ladene Artist, MD,FACG;  Location: [facility] ENDOSCOPY;  Service: Endoscopy;  Laterality: N/A;  . Esophagogastroduodenoscopy  08/12/2011    Procedure: ESOPHAGOGASTRODUODENOSCOPY (EGD);  Surgeon: Ladene Artist, MD,FACG;  Location: [facility] ENDOSCOPY;  Service: Endoscopy;  Laterality: N/A;  . Givens capsule study  08/13/2011    Procedure: GIVENS CAPSULE STUDY;  Surgeon: Ladene Artist, MD,FACG;  Location: [place];  Service: Endoscopy;  Laterality: N/A;  . Nissen fundoplication  5035  .  Uvulopalatopharyngoplasty, tonsillectomy and septoplasty  2000's  . Hammer toe surgery Bilateral ~ 2000  . Colonoscopy  06/19/2006  . Esophagogastroduodenoscopy  06/04/2005  . Cardiac catheterization  2004    mild disease  . [name] surgery    . Abdominal hysterectomy  1985  . Cholecystectomy  1990's  . Hydradenitis excision  04/01/2012    Procedure: EXCISION HYDRADENITIS GROIN;  Surgeon: Pedro Earls, MD;  Location: [facility] ORS;  Service: General;  Laterality: Bilateral;  Excision of Hydradenitis of Perineum   . Appendectomy  1985  . Breast cyst excision Right 2008  . Reduction mammaplasty  1996?  Marland Kitchen Breast biopsy Right 2007  . Posterior lumbar fusion  2008 X 2  . Tonsillectomy and adenoidectomy  1959 AND 2000    Family History  Problem Relation Age of Onset  . Breast cancer Mother   . Heart disease Mother   . Hypertension Mother   . Diabetes Father   . Heart disease Father   . Stroke Father   . Heart disease Sister   . Hypertension Sister   . Hypertension Sister   . Hyperlipidemia Sister   . Diabetes Sister   . Emphysema      great uncle  . Aneurysm Sister     brain    Social History:  reports that she quit smoking about 7 weeks ago. Her smoking use included Cigarettes. She has a 13.53 pack-year smoking history. She has never used smokeless tobacco. She reports that she does not drink alcohol or use illicit drugs.  Allergies:  Allergies  Allergen Reactions  . Bactrim [Sulfamethoxazole-Trimethoprim] Shortness Of Breath  . Sulfa Antibiotics Shortness Of Breath and Palpitations  . Iodine Swelling  . Metformin And Related Diarrhea  . Adhesive [Tape] Rash  . Shellfish Allergy Rash    Medications: I have reviewed the patient's current medications.  Results for orders placed during the hospital encounter of 09/17/13 (from the past 48 hour(s))  GLUCOSE, CAPILLARY     Status: Abnormal   Collection Time    09/17/13 10:58 AM      Result Value Ref Range   Glucose-Capillary 131 (*) 70 - 99 mg/dL   Comment 1 Documented in Chart    GLUCOSE, CAPILLARY     Status: Abnormal   Collection Time    09/17/13  2:38 PM      Result Value Ref Range   Glucose-Capillary 119 (*) 70 - 99 mg/dL  TROPONIN I     Status: None   Collection Time    09/17/13  3:41 PM      Result Value Ref Range   Troponin I <0.30  <0.30 ng/mL   Comment:            Due to the release kinetics of cTnI,     a negative result within the first hours     of the onset of symptoms does not rule out     myocardial  infarction with certainty.     If myocardial infarction is still suspected,     repeat the test at appropriate intervals.  CBC     Status: Abnormal   Collection Time    09/17/13  7:00 PM      Result Value Ref Range   WBC 7.7  4.0 - 10.5 K/uL   RBC 4.23  3.87 - 5.11 MIL/uL   Hemoglobin 11.7 (*) 12.0 - 15.0 g/dL   HCT 34.6 (*) 36.0 - 46.0 %   MCV 81.8  78.0 -  100.0 fL   MCH 27.7  26.0 - 34.0 pg   MCHC 33.8  30.0 - 36.0 g/dL   RDW 12.7  11.5 - 15.5 %   Platelets 175  150 - 400 K/uL  CREATININE, SERUM     Status: None   Collection Time    09/17/13  7:00 PM      Result Value Ref Range   Creatinine, Ser 0.67  0.50 - 1.10 mg/dL   GFR calc non Af Amer >90  >90 mL/min   GFR calc Af Amer >90  >90 mL/min   Comment: (NOTE)     The eGFR has been calculated using the CKD EPI equation.     This calculation has not been validated in all clinical situations.     eGFR's persistently <90 mL/min signify possible Chronic Kidney     Disease.  HEMOGLOBIN A1C     Status: Abnormal   Collection Time    09/17/13  7:00 PM      Result Value Ref Range   Hemoglobin A1C 9.5 (*) <5.7 %   Comment: (NOTE)                                                                               According to the ADA Clinical Practice Recommendations for 2011, when     HbA1c is used as a screening test:      >=6.5%   Diagnostic of Diabetes Mellitus               (if abnormal result is confirmed)     5.7-6.4%   Increased risk of developing Diabetes Mellitus     References:Diagnosis and Classification of Diabetes Mellitus,Diabetes     GOTL,5726,20(BTDHR 1):S62-S69 and Standards of Medical Care in             Diabetes - 2011,Diabetes Care,2011,34 (Suppl 1):S11-S61.   Mean Plasma Glucose 226 (*) <117 mg/dL   Comment: Performed at [place], CAPILLARY     Status: Abnormal   Collection Time    09/17/13  7:13 PM      Result Value Ref Range   Glucose-Capillary 170 (*) 70 - 99 mg/dL  GLUCOSE, CAPILLARY      Status: Abnormal   Collection Time    09/17/13  8:47 PM      Result Value Ref Range   Glucose-Capillary 206 (*) 70 - 99 mg/dL  GLUCOSE, CAPILLARY     Status: Abnormal   Collection Time    09/17/13 11:54 PM      Result Value Ref Range   Glucose-Capillary 238 (*) 70 - 99 mg/dL  GLUCOSE, CAPILLARY     Status: Abnormal   Collection Time    09/18/13  3:31 AM      Result Value Ref Range   Glucose-Capillary 178 (*) 70 - 99 mg/dL  BASIC METABOLIC PANEL     Status: Abnormal   Collection Time    09/18/13  4:25 AM      Result Value Ref Range   Sodium 140  137 - 147 mEq/L   Potassium 4.3  3.7 - 5.3 mEq/L   Chloride 102  96 - 112 mEq/L   CO2  27  19 - 32 mEq/L   Glucose, Bld 190 (*) 70 - 99 mg/dL   BUN 11  6 - 23 mg/dL   Creatinine, Ser 0.75  0.50 - 1.10 mg/dL   Calcium 9.5  8.4 - 10.5 mg/dL   GFR calc non Af Amer >90  >90 mL/min   GFR calc Af Amer >90  >90 mL/min   Comment: (NOTE)     The eGFR has been calculated using the CKD EPI equation.     This calculation has not been validated in all clinical situations.     eGFR's persistently <90 mL/min signify possible Chronic Kidney     Disease.  CBC     Status: Abnormal   Collection Time    09/18/13  5:00 AM      Result Value Ref Range   WBC 5.6  4.0 - 10.5 K/uL   RBC 4.14  3.87 - 5.11 MIL/uL   Hemoglobin 11.5 (*) 12.0 - 15.0 g/dL   HCT 34.6 (*) 36.0 - 46.0 %   MCV 83.6  78.0 - 100.0 fL   MCH 27.8  26.0 - 34.0 pg   MCHC 33.2  30.0 - 36.0 g/dL   RDW 12.7  11.5 - 15.5 %   Platelets 163  150 - 400 K/uL  CK TOTAL AND CKMB     Status: None   Collection Time    09/18/13  5:35 AM      Result Value Ref Range   Total CK 93  7 - 177 U/L   CK, MB 1.4  0.3 - 4.0 ng/mL   Relative Index RELATIVE INDEX IS INVALID  0.0 - 2.5   Comment: WHEN CK < 100 U/L                Performed at [place], CAPILLARY     Status: Abnormal   Collection Time    09/18/13  7:34 AM      Result Value Ref Range   Glucose-Capillary 186 (*) 70 -  99 mg/dL   Comment 1 Documented in Chart     Comment 2 Notify RN    GLUCOSE, CAPILLARY     Status: Abnormal   Collection Time    09/18/13 12:07 PM      Result Value Ref Range   Glucose-Capillary 224 (*) 70 - 99 mg/dL   Comment 1 Documented in Chart     Comment 2 Notify RN    GLUCOSE, CAPILLARY     Status: Abnormal   Collection Time    09/18/13  4:59 PM      Result Value Ref Range   Glucose-Capillary 240 (*) 70 - 99 mg/dL   Comment 1 Documented in Chart     Comment 2 Notify RN      No results found.  Review of Systems  Constitutional: Negative for fever and chills.  Eyes: Negative for double vision, photophobia and pain.  Respiratory: Negative for cough, hemoptysis and sputum production.   Cardiovascular: Positive for chest pain. Negative for palpitations, orthopnea, claudication, leg swelling and PND.  Gastrointestinal: Negative for nausea, vomiting and abdominal pain.  Genitourinary: Negative for dysuria.  Neurological: Negative for dizziness and headaches.   Blood pressure 123/54, pulse 71, temperature 98.1 F (36.7 C), temperature source Oral, resp. rate 16, height 5' 4"  (1.626 m), weight 68.9 kg (151 lb 14.4 oz), SpO2 96.00%. Physical Exam  Constitutional: She is oriented to person, place, and time.  HENT:  Head: Normocephalic  and atraumatic.  Eyes: Conjunctivae are normal. Pupils are equal, round, and reactive to light. Left eye exhibits no discharge. No scleral icterus.  Neck: Normal range of motion. Neck supple. No JVD present. No tracheal deviation present. No thyromegaly present.  Cardiovascular: Normal rate and regular rhythm.   Murmur (Soft systolic murmur noted no S3 gallop no pericardial rub) heard. Respiratory: Effort normal and breath sounds normal. No respiratory distress. She has no wheezes.  GI: Soft. Bowel sounds are normal. She exhibits no distension. There is no tenderness.  Genitourinary:  Surgical dressing dry  Musculoskeletal: She exhibits no edema  and no tenderness.  Neurological: She is alert and oriented to person, place, and time.    Assessment/Plan: Recurrent chest pain with some features worrisome for angina although history very inconsistent and prior multiple negative stress test Mild coronary artery disease Status post resection of hidradenitis of perineum Hypertension Insulin requiring diabetes mellitus History of questionable CVA IVS Hiatus hernia Tobacco abuse Remote cocaine abuse Hypercholesteremia Plan Check cardiac enzymes now and in a.m. Check EKG in a.m. DC hydrochlorothiazide Add low-dose beta blockers as per orders Continue rest off medications Will not pursue further workup this admission unless patient had recurrent chest pain EKG changes or elevated cardiac markers. Clent Demark 09/18/2013, 5:55 PM

## 2013-09-18 NOTE — Progress Notes (Signed)
Consulted for chest pain by Dr. Excell Seltzer.  Her EKG shows sinus rhythm with PAC's, no concerning ischemic changes. First troponin yesterday was negative. It turns out this is a patient of Dr. Doylene Canard.  I spoke with Dr. Terrence Dupont, who is covering call this weekend and he will see the patient.    Pixie Casino, MD, [facility] Attending Cardiologist [place]

## 2013-09-19 LAB — CBC
HCT: 36.8 % (ref 36.0–46.0)
Hemoglobin: 12.2 g/dL (ref 12.0–15.0)
MCH: 27.5 pg (ref 26.0–34.0)
MCHC: 33.2 g/dL (ref 30.0–36.0)
MCV: 83.1 fL (ref 78.0–100.0)
Platelets: 170 10*3/uL (ref 150–400)
RBC: 4.43 MIL/uL (ref 3.87–5.11)
RDW: 12.4 % (ref 11.5–15.5)
WBC: 4.4 10*3/uL (ref 4.0–10.5)

## 2013-09-19 LAB — BASIC METABOLIC PANEL
BUN: 15 mg/dL (ref 6–23)
CO2: 25 mEq/L (ref 19–32)
Calcium: 9.9 mg/dL (ref 8.4–10.5)
Chloride: 102 mEq/L (ref 96–112)
Creatinine, Ser: 0.88 mg/dL (ref 0.50–1.10)
GFR calc Af Amer: 82 mL/min — ABNORMAL LOW (ref >90)
GFR calc non Af Amer: 71 mL/min — ABNORMAL LOW (ref >90)
Glucose, Bld: 249 mg/dL — ABNORMAL HIGH (ref 70–99)
Potassium: 4.2 mEq/L (ref 3.7–5.3)
Sodium: 140 mEq/L (ref 137–147)

## 2013-09-19 LAB — TROPONIN I: Troponin I: <0.3 ng/mL (ref <0.30)

## 2013-09-19 LAB — GLUCOSE, CAPILLARY
Glucose-Capillary: 206 mg/dL — ABNORMAL HIGH (ref 70–99)
Glucose-Capillary: 228 mg/dL — ABNORMAL HIGH (ref 70–99)
Glucose-Capillary: 238 mg/dL — ABNORMAL HIGH (ref 70–99)

## 2013-09-19 NOTE — Progress Notes (Signed)
General Surgery Note  LOS: 2 days  POD -  2 Days Post-Op  Assessment/Plan: 1.  EXCISION PERINEAL HIDRADENITIS, EXCISION MASS - 09/17/2013 - M. Martin  Wounds okay.  Some drainage from right buttocks wound  2.  Chest pain/CAD  Appreciate Dr. Zenia Resides assistance.  If Dr. Terrence Dupont gives okay - patient may go home today 3.  On plavix 4.  On doxycycline 5.  DVT prophylaxis - SQ Heparin 6.  Diabetes  Glucose 206 - 09/20/2103 7.  History of strokes. March 2009, March 2010, and 2011. 8.  Smokes   Active Problems:   Hidradenitis, left pubic area and left lower quadrant  Subjective:  Doing okay.  Here mainly for cardiac issues.  It sounds like she has all her meds at home. Objective:   Filed Vitals:   09/19/13 0442  BP: 137/68  Pulse: 64  Temp: 98.3 F (36.8 C)  Resp: 20     Intake/Output from previous day:  05/16 0701 - 05/17 0700 In: 2441.7 [P.O.:1200; I.V.:1241.7] Out: -   Intake/Output this shift:      Physical Exam:   General: WN AA F who is alert and oriented.    HEENT: Normal. Pupils equal. .   Lungs: Clear.   Wound: Suprapubic wound clean.  Right buttocks wound with minimal drainage.   Lab Results:    Recent Labs  09/18/13 0500 09/19/13 0420  WBC 5.6 4.4  HGB 11.5* 12.2  HCT 34.6* 36.8  PLT 163 170    BMET   Recent Labs  09/18/13 0425 09/19/13 0420  NA 140 140  K 4.3 4.2  CL 102 102  CO2 27 25  GLUCOSE 190* 249*  BUN 11 15  CREATININE 0.75 0.88  CALCIUM 9.5 9.9    PT/INR  No results found for this basename: LABPROT, INR,  in the last 72 hours  ABG  No results found for this basename: PHART, PCO2, PO2, HCO3,  in the last 72 hours   Studies/Results:  No results found.   Anti-infectives:   Anti-infectives   Start     Dose/Rate Route Frequency Ordered Stop   09/17/13 2000  doxycycline (VIBRA-TABS) tablet 100 mg     100 mg Oral Daily 09/17/13 1801     09/17/13 1036  cefOXitin (MEFOXIN) 2 g in dextrose 5 % 50 mL IVPB     2 g 100 mL/hr  over 30 Minutes Intravenous On call to O.R. 09/17/13 1036 09/17/13 1342      Alphonsa Overall, MD, FACS Pager: [place] [facility] Office: [phone] 09/19/2013

## 2013-09-19 NOTE — Progress Notes (Signed)
Subjective:  No anginal chest pain complains of vague GERD symptoms. 2 sets of cardiac enzymes and EKG has been negative. Frequent APCs on the monitor asymptomatic occasional episode of bradycardia asymptomatic  Objective:  Vital Signs in the last 24 hours: Temp:  [98 F (36.7 C)-98.3 F (36.8 C)] 98.3 F (36.8 C) (05/17 0442) Pulse Rate:  [64-71] 64 (05/17 0442) Resp:  [16-20] 20 (05/17 0442) BP: (123-137)/(54-70) 137/68 mmHg (05/17 0442) SpO2:  [96 %-99 %] 96 % (05/17 0442)  Intake/Output from previous day: 05/16 0701 - 05/17 0700 In: 2441.7 [P.O.:1200; I.V.:1241.7] Out: -  Intake/Output from this shift:    Physical Exam: Neck: no adenopathy, no carotid bruit, no JVD and supple, symmetrical, trachea midline Lungs: clear to auscultation bilaterally Heart: regular rate and rhythm, S1, S2 normal, no murmur, click, rub or gallop Abdomen: soft, non-tender; bowel sounds normal; no masses,  no organomegaly Extremities: extremities normal, atraumatic, no cyanosis or edema  Lab Results:  Recent Labs  09/18/13 0500 09/19/13 0420  WBC 5.6 4.4  HGB 11.5* 12.2  PLT 163 170    Recent Labs  09/18/13 0425 09/19/13 0420  NA 140 140  K 4.3 4.2  CL 102 102  CO2 27 25  GLUCOSE 190* 249*  BUN 11 15  CREATININE 0.75 0.88    Recent Labs  09/18/13 1905 09/19/13 0420  TROPONINI <0.30 <0.30   Hepatic Function Panel No results found for this basename: PROT, ALBUMIN, AST, ALT, ALKPHOS, BILITOT, BILIDIR, IBILI,  in the last 72 hours No results found for this basename: CHOL,  in the last 72 hours No results found for this basename: PROTIME,  in the last 72 hours  Imaging: Imaging results have been reviewed and No results found.  Cardiac Studies:  Assessment/Plan:  Atypical chest pain MI ruled out Mild coronary artery disease  Status post resection of hidradenitis of perineum  Hypertension  Insulin requiring diabetes mellitus  History of questionable CVA  IBS Hiatus  hernia  Tobacco abuse  Remote cocaine abuse  Hypercholesteremia  Plan Okay to DC home from cardiac point of view Continue home medicines Will stop Lopressor in view of bradycardia Followup with Dr. Doylene Canard. 1-2 weeks  LOS: 2 days    Clent Demark 09/19/2013, 10:16 AM

## 2013-09-20 ENCOUNTER — Encounter (HOSPITAL_COMMUNITY): Payer: Self-pay | Admitting: Surgery

## 2013-09-20 LAB — GLUCOSE, CAPILLARY: Glucose-Capillary: 195 mg/dL — ABNORMAL HIGH (ref 70–99)

## 2013-09-24 ENCOUNTER — Encounter (INDEPENDENT_AMBULATORY_CARE_PROVIDER_SITE_OTHER): Payer: Self-pay | Admitting: Surgery

## 2013-09-24 ENCOUNTER — Ambulatory Visit (INDEPENDENT_AMBULATORY_CARE_PROVIDER_SITE_OTHER): Payer: Medicare Other | Admitting: Surgery

## 2013-09-24 VITALS — BP 127/86 | HR 70 | Temp 98.6°F | Resp 16 | Ht 64.0 in | Wt 147.0 lb

## 2013-09-24 DIAGNOSIS — L732 Hidradenitis suppurativa: Secondary | ICD-10-CM

## 2013-09-24 NOTE — Progress Notes (Signed)
[name] 58 y.o.  Body mass index is 25.22 kg/(m^2).  Patient Active Problem List   Diagnosis Date Noted  . Pancreatitis 01/02/2013  . Abdominal pain, epigastric 12/14/2012  . IBS (irritable bowel syndrome) 09/12/2011  . GERD (gastroesophageal reflux disease) 09/12/2011  . Diverticulosis of colon with hemorrhage 08/10/2011  . Chest pain at rest 07/06/2011    Class: Acute  . Hidradenitis, left pubic area and left lower quadrant 01/18/2011  . DIABETES MELLITUS 08/24/2007  . DYSLIPIDEMIA 08/24/2007  . HYPERTENSION 08/24/2007    Allergies  Allergen Reactions  . Bactrim [Sulfamethoxazole-Trimethoprim] Shortness Of Breath  . Sulfa Antibiotics Shortness Of Breath and Palpitations  . Iodine Swelling  . Metformin And Related Diarrhea  . Adhesive [Tape] Rash  . Shellfish Allergy Rash    Past Surgical History  Procedure Laterality Date  . Hiatal hernia repair    . Breast reduction surgery    . Axillary hidradenitis excision  1990-2008    bilateral  . Hydradenitis excision  01/2011; 03/2012    'groin and abdomen; 03/2012" (09/14/2012)  . Anterior cervical decomp/discectomy fusion  2002  . Colonoscopy  08/12/2011    Procedure: COLONOSCOPY;  Surgeon: Ladene Artist, MD,FACG;  Location: [facility] ENDOSCOPY;  Service: Endoscopy;  Laterality: N/A;  . Esophagogastroduodenoscopy  08/12/2011    Procedure: ESOPHAGOGASTRODUODENOSCOPY (EGD);  Surgeon: Ladene Artist, MD,FACG;  Location: [facility] ENDOSCOPY;  Service: Endoscopy;  Laterality: N/A;  . Givens capsule study  08/13/2011    Procedure: GIVENS CAPSULE STUDY;  Surgeon: Ladene Artist, MD,FACG;  Location: [place];  Service: Endoscopy;  Laterality: N/A;  . Nissen fundoplication  2025  . Uvulopalatopharyngoplasty, tonsillectomy and septoplasty  2000's  . Hammer toe surgery Bilateral ~ 2000  . Colonoscopy  06/19/2006  . Esophagogastroduodenoscopy  06/04/2005  . Cardiac catheterization  2004    mild disease  . Back surgery    . Abdominal  hysterectomy  1985  . Cholecystectomy  1990's  . Hydradenitis excision  04/01/2012    Procedure: EXCISION HYDRADENITIS GROIN;  Surgeon: Pedro Earls, MD;  Location: [facility] ORS;  Service: General;  Laterality: Bilateral;  Excision of Hydradenitis of Perineum  . Appendectomy  1985  . Breast cyst excision Right 2008  . Reduction mammaplasty  1996?  Marland Kitchen Breast biopsy Right 2007  . Posterior lumbar fusion  2008 X 2  . Tonsillectomy and adenoidectomy  1959 AND 2000  . Hydradenitis excision N/A 09/17/2013    Procedure: EXCISION PERINEAL HIDRADENITIS ;  Surgeon: Pedro Earls, MD;  Location: [facility] ORS;  Service: General;  Laterality: N/A;  also in the pubis area  . Mass excision Right 09/17/2013    Procedure: EXCISION MASS;  Surgeon: Pedro Earls, MD;  Location: [facility];  Service: General;  Laterality: Right;   Foye Spurling, MD No diagnosis found.  Perineum looks and feels better.  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  It appears to run down her tensor fascia lata.  Will explore what else to do Return 4 weeks.  Matt B. Hassell Done, MD, [facility] [phone] beeper [phone]  09/24/2013 5:03 PM

## 2013-10-12 NOTE — Discharge Summary (Signed)
Physician Discharge Summary  Patient ID: [name] MRN: [id number] DOB/AGE: 59-Jul-1957 59 y.o.  Admit date: 09/17/2013 Discharge date: 09/19/2013 Admission Diagnoses:  Recurrent hidradenitis and painful nodules along right tensor fascia lata  Discharge Diagnoses:  same  Active Problems:   Hidradenitis, left pubic area and left lower quadrant   Surgery:  Excision of hidradenitis and biopsy of hard calcified areas of lateral thigh  Discharged Condition: improved  Hospital Course:   Had surgery on Friday.  Wounds ok and discharged by my partners over the weekend.    Consults: none  Significant Diagnostic Studies: none    Discharge Exam: Blood pressure 137/68, pulse 64, temperature 98.3 F (36.8 C), temperature source Oral, resp. rate 20, height 5\' 4"  (1.626 m), weight 151 lb 14.4 oz (68.9 kg), SpO2 96.00%. Not discharged by me.   Disposition: 01-Home or Self Care  Discharge Instructions   Diet - low sodium heart healthy    Complete by:  As directed      Increase activity slowly    Complete by:  As directed             Medication List         acetaminophen 500 MG tablet  Commonly known as:  TYLENOL  Take 500 mg by mouth every 6 (six) hours as needed for pain.     albuterol 108 (90 BASE) MCG/ACT inhaler  Commonly known as:  PROVENTIL HFA;VENTOLIN HFA  Inhale 2 puffs into the lungs every 6 (six) hours as needed for wheezing.     ALPRAZolam 0.5 MG tablet  Commonly known as:  XANAX  Take 0.5 mg by mouth at bedtime as needed. For anxiety     clopidogrel 75 MG tablet  Commonly known as:  PLAVIX  Take 75 mg by mouth daily.     diphenhydrAMINE 25 MG tablet  Commonly known as:  BENADRYL  Take 25 mg by mouth every 6 (six) hours as needed for itching. For itching or taking certain medications that make her itch.     doxycycline 100 MG tablet  Commonly known as:  VIBRA-TABS  Take 100 mg by mouth daily.     esomeprazole 40 MG capsule  Commonly known as:  NEXIUM   Take 40 mg by mouth daily before breakfast.     insulin lispro protamine-lispro (75-25) 100 UNIT/ML Susp injection  Commonly known as:  HUMALOG 75/25 MIX  Inject 12-30 Units into the skin 3 (three) times daily.     HUMALOG MIX 75/25 KWIKPEN Irion  Inject 12-30 Units into the skin 3 (three) times daily.     loratadine 10 MG tablet  Commonly known as:  CLARITIN  Take 10 mg by mouth daily as needed for allergies.     mometasone 50 MCG/ACT nasal spray  Commonly known as:  NASONEX  Place 1 spray into the nose daily as needed. For allergies     mupirocin cream 2 %  Commonly known as:  BACTROBAN  Apply 1 application topically 2 (two) times daily.     nitroGLYCERIN 0.4 mg/hr patch  Commonly known as:  NITRODUR - Dosed in mg/24 hr  Place 1 patch onto the skin as needed. Chest pains     nitroGLYCERIN 0.4 MG SL tablet  Commonly known as:  NITROSTAT  Place 0.4 mg under the tongue every 5 (five) minutes as needed. Chest pains     oxycodone 5 MG capsule  Commonly known as:  OXY-IR  Take 5 mg by mouth every  6 (six) hours as needed for pain.     potassium chloride SA 20 MEQ tablet  Commonly known as:  K-DUR,KLOR-CON  Take 20 mEq by mouth daily as needed (for cramps).     promethazine 25 MG tablet  Commonly known as:  PHENERGAN  Take 25 mg by mouth every 6 (six) hours as needed for nausea or vomiting.     rosuvastatin 20 MG tablet  Commonly known as:  CRESTOR  Take 20 mg by mouth every morning.     SUMAtriptan 50 MG tablet  Commonly known as:  IMITREX  Take 50 mg by mouth every 2 (two) hours as needed for migraine.     traMADol 50 MG tablet  Commonly known as:  ULTRAM  Take 100 mg by mouth every 6 (six) hours as needed for pain.     TRIBENZOR 40-5-12.5 MG Tabs  Generic drug:  Olmesartan-Amlodipine-HCTZ  Take 1 tablet by mouth every morning.     zolpidem 10 MG tablet  Commonly known as:  AMBIEN  Take 5 mg by mouth at bedtime as needed. FOR SLEEP         Signed: Pedro Earls 10/12/2013, 5:15 PM

## 2013-10-13 ENCOUNTER — Encounter (INDEPENDENT_AMBULATORY_CARE_PROVIDER_SITE_OTHER): Payer: Medicare Other | Admitting: Surgery

## 2013-11-02 ENCOUNTER — Encounter (INDEPENDENT_AMBULATORY_CARE_PROVIDER_SITE_OTHER): Payer: Self-pay | Admitting: Surgery

## 2013-11-02 ENCOUNTER — Ambulatory Visit (INDEPENDENT_AMBULATORY_CARE_PROVIDER_SITE_OTHER): Payer: Medicare Other | Admitting: Surgery

## 2013-11-02 NOTE — Progress Notes (Signed)
[name] 58 y.o.  Body mass index is 24.71 kg/(m^2).  Patient Active Problem List   Diagnosis Date Noted  . Pancreatitis 01/02/2013  . Abdominal pain, epigastric 12/14/2012  . IBS (irritable bowel syndrome) 09/12/2011  . GERD (gastroesophageal reflux disease) 09/12/2011  . Diverticulosis of colon with hemorrhage 08/10/2011  . Chest pain at rest 07/06/2011    Class: Acute  . Hidradenitis, left pubic area and left lower quadrant 01/18/2011  . DIABETES MELLITUS 08/24/2007  . DYSLIPIDEMIA 08/24/2007  . HYPERTENSION 08/24/2007    Allergies  Allergen Reactions  . Bactrim [Sulfamethoxazole-Trimethoprim] Shortness Of Breath  . Sulfa Antibiotics Shortness Of Breath and Palpitations  . Iodine Swelling  . Metformin And Related Diarrhea  . Adhesive [Tape] Rash  . Shellfish Allergy Rash      Past Surgical History  Procedure Laterality Date  . Hiatal hernia repair    . Breast reduction surgery    . Axillary hidradenitis excision  1990-2008    bilateral  . Hydradenitis excision  01/2011; 03/2012    'groin and abdomen; 03/2012" (09/14/2012)  . Anterior cervical decomp/discectomy fusion  2002  . Colonoscopy  08/12/2011    Procedure: COLONOSCOPY;  Surgeon: Ladene Artist, MD,FACG;  Location: [facility] ENDOSCOPY;  Service: Endoscopy;  Laterality: N/A;  . Esophagogastroduodenoscopy  08/12/2011    Procedure: ESOPHAGOGASTRODUODENOSCOPY (EGD);  Surgeon: Ladene Artist, MD,FACG;  Location: [facility] ENDOSCOPY;  Service: Endoscopy;  Laterality: N/A;  . Givens capsule study  08/13/2011    Procedure: GIVENS CAPSULE STUDY;  Surgeon: Ladene Artist, MD,FACG;  Location: [place];  Service: Endoscopy;  Laterality: N/A;  . Nissen fundoplication  5597  . Uvulopalatopharyngoplasty, tonsillectomy and septoplasty  2000's  . Hammer toe surgery Bilateral ~ 2000  . Colonoscopy  06/19/2006  . Esophagogastroduodenoscopy  06/04/2005  . Cardiac catheterization  2004    mild disease  . Back surgery    . Abdominal  hysterectomy  1985  . Cholecystectomy  1990's  . Hydradenitis excision  04/01/2012    Procedure: EXCISION HYDRADENITIS GROIN;  Surgeon: Pedro Earls, MD;  Location: [facility] ORS;  Service: General;  Laterality: Bilateral;  Excision of Hydradenitis of Perineum  . Appendectomy  1985  . Breast cyst excision Right 2008  . Reduction mammaplasty  1996?  Marland Kitchen Breast biopsy Right 2007  . Posterior lumbar fusion  2008 X 2  . Tonsillectomy and adenoidectomy  1959 AND 2000  . Hydradenitis excision N/A 09/17/2013    Procedure: EXCISION PERINEAL HIDRADENITIS ;  Surgeon: Pedro Earls, MD;  Location: [facility];  Service: General;  Laterality: N/A;  also in the pubis area  . Mass excision Right 09/17/2013    Procedure: EXCISION MASS;  Surgeon: Pedro Earls, MD;  Location: [facility];  Service: General;  Laterality: Right;   Foye Spurling, MD No diagnosis found.  Her perineal hidradenitis seems under control with Hibiclens.  Gave her a path report and would ask Dr. Sharol Given to advise about the chronic calcinosis along the fascia lata.   Return 2 months Matt B. Hassell Done, MD, [facility] [phone] beeper [phone]  11/02/2013 3:17 PM

## 2013-12-01 ENCOUNTER — Telehealth (INDEPENDENT_AMBULATORY_CARE_PROVIDER_SITE_OTHER): Payer: Self-pay

## 2013-12-01 NOTE — Addendum Note (Signed)
Addended by: Ivor Costa on: 12/01/2013 08:43 AM   Modules accepted: Orders

## 2013-12-01 NOTE — Telephone Encounter (Signed)
Called and left message for patient to call our office Please make patient aware of Referral to [facility].  Patient has appointment scheduled on 12/08/13 @ 2:15pm w/Dr. Sharol Given.  Records, demographics & imaging faxed to [phone].

## 2013-12-24 ENCOUNTER — Telehealth (INDEPENDENT_AMBULATORY_CARE_PROVIDER_SITE_OTHER): Payer: Self-pay

## 2013-12-24 ENCOUNTER — Encounter (INDEPENDENT_AMBULATORY_CARE_PROVIDER_SITE_OTHER): Payer: Medicare Other | Admitting: Surgery

## 2013-12-24 DIAGNOSIS — L732 Hidradenitis suppurativa: Secondary | ICD-10-CM

## 2013-12-24 MED ORDER — DOXYCYCLINE HYCLATE 100 MG PO CAPS: 100.0000 mg | ORAL_CAPSULE | Freq: Two times a day (BID) | ORAL | Status: DC

## 2013-12-24 NOTE — Telephone Encounter (Signed)
Refill for Doxy 100mg  capsule sent to [facility] via [facility]

## 2013-12-24 NOTE — Telephone Encounter (Signed)
Called and left message for patient to make aware that a refill for Doxycycline 100mg  capsules, #21 sent to [facility] on [address] in [place]

## 2013-12-24 NOTE — Telephone Encounter (Signed)
Pt had appt to see Dr Hassell Done this afternoon however she has had to cancel her appt due to having  a migraine.  Pt would like to know if Dr Hassell Done can call her in a rx for Doxycycline 100mg , she states that she has several new perineal hidradenitis spots. Informed pt that I will send a message to Dr Hassell Done and his assistant and we will contact her as soon as we receive a response. Pt verbalized understanding

## 2013-12-24 NOTE — Addendum Note (Signed)
Addended by: Ivor Costa on: 12/24/2013 04:24 PM   Modules accepted: Orders

## 2014-01-16 ENCOUNTER — Encounter (HOSPITAL_COMMUNITY): Payer: Self-pay | Admitting: Emergency Medicine

## 2014-01-16 ENCOUNTER — Emergency Department (HOSPITAL_COMMUNITY)
Admission: EM | Admit: 2014-01-16 | Discharge: 2014-01-16 | Disposition: A | Discharge status: Home / Self Care | Payer: Medicare Other | Attending: Emergency Medicine | Admitting: Emergency Medicine

## 2014-01-16 DIAGNOSIS — E119 Type 2 diabetes mellitus without complications: Secondary | ICD-10-CM | POA: Diagnosis not present

## 2014-01-16 DIAGNOSIS — F411 Generalized anxiety disorder: Secondary | ICD-10-CM | POA: Diagnosis not present

## 2014-01-16 DIAGNOSIS — M6281 Muscle weakness (generalized): Secondary | ICD-10-CM | POA: Insufficient documentation

## 2014-01-16 DIAGNOSIS — M25519 Pain in unspecified shoulder: Secondary | ICD-10-CM | POA: Diagnosis not present

## 2014-01-16 DIAGNOSIS — K219 Gastro-esophageal reflux disease without esophagitis: Secondary | ICD-10-CM | POA: Insufficient documentation

## 2014-01-16 DIAGNOSIS — E785 Hyperlipidemia, unspecified: Secondary | ICD-10-CM | POA: Diagnosis not present

## 2014-01-16 DIAGNOSIS — Z872 Personal history of diseases of the skin and subcutaneous tissue: Secondary | ICD-10-CM | POA: Diagnosis not present

## 2014-01-16 DIAGNOSIS — Z8701 Personal history of pneumonia (recurrent): Secondary | ICD-10-CM | POA: Insufficient documentation

## 2014-01-16 DIAGNOSIS — Z7902 Long term (current) use of antithrombotics/antiplatelets: Secondary | ICD-10-CM | POA: Insufficient documentation

## 2014-01-16 DIAGNOSIS — M542 Cervicalgia: Secondary | ICD-10-CM | POA: Diagnosis not present

## 2014-01-16 DIAGNOSIS — J45909 Unspecified asthma, uncomplicated: Secondary | ICD-10-CM | POA: Diagnosis not present

## 2014-01-16 DIAGNOSIS — M549 Dorsalgia, unspecified: Secondary | ICD-10-CM | POA: Insufficient documentation

## 2014-01-16 DIAGNOSIS — M19049 Primary osteoarthritis, unspecified hand: Secondary | ICD-10-CM | POA: Diagnosis not present

## 2014-01-16 DIAGNOSIS — E538 Deficiency of other specified B group vitamins: Secondary | ICD-10-CM | POA: Insufficient documentation

## 2014-01-16 DIAGNOSIS — Z87891 Personal history of nicotine dependence: Secondary | ICD-10-CM | POA: Diagnosis not present

## 2014-01-16 DIAGNOSIS — Z79899 Other long term (current) drug therapy: Secondary | ICD-10-CM | POA: Insufficient documentation

## 2014-01-16 DIAGNOSIS — G43909 Migraine, unspecified, not intractable, without status migrainosus: Secondary | ICD-10-CM | POA: Insufficient documentation

## 2014-01-16 DIAGNOSIS — Z9889 Other specified postprocedural states: Secondary | ICD-10-CM | POA: Insufficient documentation

## 2014-01-16 DIAGNOSIS — D649 Anemia, unspecified: Secondary | ICD-10-CM | POA: Diagnosis not present

## 2014-01-16 DIAGNOSIS — Z794 Long term (current) use of insulin: Secondary | ICD-10-CM | POA: Insufficient documentation

## 2014-01-16 DIAGNOSIS — M47812 Spondylosis without myelopathy or radiculopathy, cervical region: Secondary | ICD-10-CM | POA: Diagnosis not present

## 2014-01-16 DIAGNOSIS — I252 Old myocardial infarction: Secondary | ICD-10-CM | POA: Insufficient documentation

## 2014-01-16 DIAGNOSIS — K589 Irritable bowel syndrome without diarrhea: Secondary | ICD-10-CM | POA: Diagnosis not present

## 2014-01-16 DIAGNOSIS — Z8673 Personal history of transient ischemic attack (TIA), and cerebral infarction without residual deficits: Secondary | ICD-10-CM | POA: Insufficient documentation

## 2014-01-16 DIAGNOSIS — I209 Angina pectoris, unspecified: Secondary | ICD-10-CM | POA: Diagnosis not present

## 2014-01-16 DIAGNOSIS — IMO0002 Reserved for concepts with insufficient information to code with codable children: Secondary | ICD-10-CM | POA: Insufficient documentation

## 2014-01-16 DIAGNOSIS — M62830 Muscle spasm of back: Secondary | ICD-10-CM

## 2014-01-16 DIAGNOSIS — Z8543 Personal history of malignant neoplasm of ovary: Secondary | ICD-10-CM | POA: Diagnosis not present

## 2014-01-16 DIAGNOSIS — Z792 Long term (current) use of antibiotics: Secondary | ICD-10-CM | POA: Diagnosis not present

## 2014-01-16 DIAGNOSIS — E669 Obesity, unspecified: Secondary | ICD-10-CM | POA: Diagnosis not present

## 2014-01-16 DIAGNOSIS — I10 Essential (primary) hypertension: Secondary | ICD-10-CM | POA: Diagnosis not present

## 2014-01-16 LAB — CBG MONITORING, ED: Glucose-Capillary: 184 mg/dL — ABNORMAL HIGH (ref 70–99)

## 2014-01-16 MED ORDER — METHOCARBAMOL 500 MG PO TABS
1000.0000 mg | ORAL_TABLET | Freq: Four times a day (QID) | ORAL | Status: AC
Start: 2014-01-16 — End: 2014-01-26

## 2014-01-16 NOTE — ED Provider Notes (Signed)
CSN: [id number]     Arrival date & time 01/16/14  1255 History  This chart was scribed for non-physician practitioner, Evalee Jefferson, PA-C,working with Virgel Manifold, MD, by Marlowe Kays, ED Scribe. This patient was seen in room APFT24/APFT24 and the patient's care was started at 2:19 PM.  Chief Complaint  Patient presents with  . Back Pain   Patient is a 58 y.o. female presenting with back pain. The history is provided by the patient. No language interpreter was used.  Back Pain Associated symptoms: no abdominal pain, no chest pain, no dysuria, no fever, no numbness and no weakness    HPI Comments:  [name] is a 58 y.o. female with PMH of MI, stroke, cancer, HTN, DM and hyperlipidemia who presents to the Emergency Department complaining of gradual onset severe worsening upper right-sided back pain she describes as spasms onset five days ago. She reports associated right-sided muscle tightness that radiates down her right arm. She states she is experiencing some intermittent right arm and hand paresthesias. She states she had a massage 5 days ago with no significant relief of the pain. She also reports taking Vicodin, applying warm compresses, ice packs and generic muscle rubs for the pain with no significant relief. She denies any trauma, injury or fall. She denies fever, numbness or weakness of the extremities. She reports past surgical history of disc removal in her cervical spine and lumbar spine 13 years ago. She denies recent changes in medications.  Past Medical History  Diagnosis Date  . Hypertension   . Anxiety   . Hyperlipidemia   . GERD (gastroesophageal reflux disease)   . Irritable bowel syndrome   . Sinus problem   . Hidradenitis     groin  . Anemia   . Schatzki's ring   . Diverticulosis of colon with hemorrhage April 2013  . Stomach ulcer 1972    non-bleeding  . Chronic asthmatic bronchitis   . Obesity   . Left-sided weakness     since stroke, left eye trouble  seeing  . Cancer 1985    ovarian, no treatment except surgery  . Anginal pain   . Pneumonia ~ 2011  . Chronic bronchitis     "I get it q yr" (09/14/2012)  . Type II diabetes mellitus   . H/O hiatal hernia   . Chronic headaches     "daily lately; sometimes monthly" (09/14/2012)  . Migraines   . Arthritis     "neck, left hand" (09/14/2012)  . Chest pain 09-11-2013    LEFT SIDED  . Heart attack     2003 mild MI, March 2013 mild MI  . Stroke 07-2007, 07-2008, 07-2009    total 3 strokes  . History of blood transfusion 1985 AND 2013  . Complication of anesthesia     OCCASIONAL TROUBLE TURNING NECK TO RIGHT  . Vitamin B 12 deficiency 07-15-2013   Past Surgical History  Procedure Laterality Date  . Hiatal hernia repair    . Breast reduction surgery    . Axillary hidradenitis excision  1990-2008    bilateral  . Hydradenitis excision  01/2011; 03/2012    'groin and abdomen; 03/2012" (09/14/2012)  . Anterior cervical decomp/discectomy fusion  2002  . Colonoscopy  08/12/2011    Procedure: COLONOSCOPY;  Surgeon: Ladene Artist, MD,FACG;  Location: [facility] ENDOSCOPY;  Service: Endoscopy;  Laterality: N/A;  . Esophagogastroduodenoscopy  08/12/2011    Procedure: ESOPHAGOGASTRODUODENOSCOPY (EGD);  Surgeon: Ladene Artist, MD,FACG;  Location: [place] ENDOSCOPY;  Service: Endoscopy;  Laterality: N/A;  . Givens capsule study  08/13/2011    Procedure: GIVENS CAPSULE STUDY;  Surgeon: Ladene Artist, MD,FACG;  Location: [place];  Service: Endoscopy;  Laterality: N/A;  . Nissen fundoplication  2353  . Uvulopalatopharyngoplasty, tonsillectomy and septoplasty  2000's  . Hammer toe surgery Bilateral ~ 2000  . Colonoscopy  06/19/2006  . Esophagogastroduodenoscopy  06/04/2005  . Cardiac catheterization  2004    mild disease  . Back surgery    . Abdominal hysterectomy  1985  . Cholecystectomy  1990's  . Hydradenitis excision  04/01/2012    Procedure: EXCISION HYDRADENITIS GROIN;  Surgeon: Pedro Earls, MD;   Location: [facility];  Service: General;  Laterality: Bilateral;  Excision of Hydradenitis of Perineum  . Appendectomy  1985  . Breast cyst excision Right 2008  . Reduction mammaplasty  1996?  Marland Kitchen Breast biopsy Right 2007  . Posterior lumbar fusion  2008 X 2  . Tonsillectomy and adenoidectomy  1959 AND 2000  . Hydradenitis excision N/A 09/17/2013    Procedure: EXCISION PERINEAL HIDRADENITIS ;  Surgeon: Pedro Earls, MD;  Location: [facility];  Service: General;  Laterality: N/A;  also in the pubis area  . Mass excision Right 09/17/2013    Procedure: EXCISION MASS;  Surgeon: Pedro Earls, MD;  Location: [facility];  Service: General;  Laterality: Right;   Family History  Problem Relation Age of Onset  . Breast cancer Mother   . Heart disease Mother   . Hypertension Mother   . Diabetes Father   . Heart disease Father   . Stroke Father   . Heart disease Sister   . Hypertension Sister   . Hypertension Sister   . Hyperlipidemia Sister   . Diabetes Sister   . Emphysema      great uncle  . Aneurysm Sister     brain   History  Substance Use Topics  . Smoking status: Former Smoker -- 0.33 packs/day for 41 years    Types: Cigarettes    Quit date: 07/25/2013  . Smokeless tobacco: Never Used  . Alcohol Use: No   OB History   Grav Para Term Preterm Abortions TAB SAB Ect Mult Living                 Review of Systems  Constitutional: Negative for fever.  Respiratory: Negative for shortness of breath.   Cardiovascular: Negative for chest pain and leg swelling.  Gastrointestinal: Negative for abdominal pain, constipation and abdominal distention.  Genitourinary: Negative for dysuria, urgency, frequency, flank pain and difficulty urinating.  Musculoskeletal: Positive for back pain. Negative for gait problem, joint swelling and myalgias.  Skin: Negative for rash.  Neurological: Negative for weakness and numbness.    Allergies  Bactrim; Sulfa antibiotics; Iodine; Metformin and related;  Adhesive; and Shellfish allergy  Home Medications   Prior to Admission medications   Medication Sig Start Date End Date Taking? Authorizing Provider  albuterol (PROVENTIL HFA;VENTOLIN HFA) 108 (90 BASE) MCG/ACT inhaler Inhale 2 puffs into the lungs every 6 (six) hours as needed for wheezing.   Yes Historical Provider, MD  ALPRAZolam Duanne Moron) 0.5 MG tablet Take 0.5 mg by mouth at bedtime as needed. For anxiety   Yes Historical Provider, MD  Calcium Citrate-Vitamin D (CALCITRATE/VITAMIN D PO) Take 1 tablet by mouth daily.   Yes Historical Provider, MD  clopidogrel (PLAVIX) 75 MG tablet Take 75 mg by mouth daily.   Yes Historical Provider, MD  Cyanocobalamin (  VITAMIN B 12 PO) Take 2 tablets by mouth daily.   Yes Historical Provider, MD  doxycycline (VIBRAMYCIN) 100 MG capsule Take 1 capsule (100 mg total) by mouth 2 (two) times daily. 12/24/13  Yes Kaylyn Lim, MD  HYDROcodone-acetaminophen (NORCO/VICODIN) 5-325 MG per tablet Take 0.5 tablets by mouth daily as needed for moderate pain.   Yes Historical Provider, MD  ibuprofen (ADVIL,MOTRIN) 800 MG tablet Take 800 mg by mouth 2 (two) times daily as needed for headache or moderate pain.   Yes Historical Provider, MD  insulin lispro protamine-lispro (HUMALOG 75/25 MIX) (75-25) 100 UNIT/ML SUSP injection Inject 15-28 Units into the skin 3 (three) times daily. Per sliding scale.   Yes Historical Provider, MD  loratadine (CLARITIN) 10 MG tablet Take 10 mg by mouth daily as needed for allergies.   Yes Historical Provider, MD  mometasone (NASONEX) 50 MCG/ACT nasal spray Place 1 spray into the nose daily as needed. For allergies   Yes Historical Provider, MD  mupirocin cream (BACTROBAN) 2 % Apply 1 application topically 2 (two) times daily.   Yes Historical Provider, MD  nitroGLYCERIN (NITRODUR - DOSED IN MG/24 HR) 0.4 mg/hr Place 1 patch onto the skin as needed. Chest pains   Yes Historical Provider, MD  nitroGLYCERIN (NITROSTAT) 0.4 MG SL tablet Place 0.4 mg  under the tongue every 5 (five) minutes as needed. Chest pains   Yes Historical Provider, MD  Olmesartan-Amlodipine-HCTZ (TRIBENZOR) 40-5-12.5 MG TABS Take 1 tablet by mouth every morning.    Yes Historical Provider, MD  ONE TOUCH ULTRA TEST test strip  09/10/13  Yes Historical Provider, MD  potassium chloride SA (K-DUR,KLOR-CON) 20 MEQ tablet Take 20 mEq by mouth daily as needed (for cramps).   Yes Historical Provider, MD  rosuvastatin (CRESTOR) 20 MG tablet Take 20 mg by mouth every morning.    Yes Historical Provider, MD  SUMAtriptan (IMITREX) 50 MG tablet Take 50 mg by mouth every 2 (two) hours as needed for migraine.   Yes Historical Provider, MD  zolpidem (AMBIEN) 10 MG tablet Take 5 mg by mouth at bedtime as needed. FOR SLEEP   Yes Historical Provider, MD  methocarbamol (ROBAXIN) 500 MG tablet Take 2 tablets (1,000 mg total) by mouth 4 (four) times daily. 01/16/14 01/26/14  Evalee Jefferson, PA-C   Triage Vitals: BP 147/97  Pulse 114  Temp(Src) 99.1 F (37.3 C) (Oral)  Resp 18  Ht 5\' 7"  (1.702 m)  Wt 138 lb (62.596 kg)  BMI 21.61 kg/m2  SpO2 97% Physical Exam  Nursing note and vitals reviewed. Constitutional: She appears well-developed and well-nourished.  HENT:  Head: Normocephalic.  Eyes: Conjunctivae are normal.  Neck: Normal range of motion.  Cardiovascular: Normal rate.   Pedal pulses normal.   Pulmonary/Chest: Effort normal.  Musculoskeletal: Normal range of motion. She exhibits no edema.  Tenderness to palpation along right paracervical and right shoulder radiating down to inferior scapula. Pain is worsened with active ROM but not passive ROM. Fair ROM of C-spine but increased pain with right rotation. No midline cervical tenderness.  Neurological: She is alert. She has normal strength. She displays no atrophy and no tremor. No sensory deficit. Gait normal.  Reflex Scores:      Bicep reflexes are 2+ on the right side and 2+ on the left side. Equal grip strength bilaterally.  Bicep DTRs 2+ bilaterally.  Skin: Skin is warm and dry.  Psychiatric: She has a normal mood and affect.    ED Course  Procedures (including  critical care time) DIAGNOSTIC STUDIES: Oxygen Saturation is 97% on RA, normal by my interpretation.   COORDINATION OF CARE: 2:31 PM- Will prescribe muscle relaxer and advised pt to continue applying heat compresses and stretches. Pt verbalizes understanding and agrees to plan.  Medications - No data to display  Labs Review Labs Reviewed  CBG MONITORING, ED - Abnormal; Notable for the following:    Glucose-Capillary 184 (*)    All other components within normal limits  CBG MONITORING, ED    Imaging Review No results found.   EKG Interpretation None      MDM   Final diagnoses:  Back muscle spasm    Pt encouraged to continue current tx.  Added robaxin.  F/u with pcp for a recheck if  Not improved over the next week.  Discussed stretching exercises for neck and shoulder.  I personally performed the services described in this documentation, which was scribed in my presence. The recorded information has been reviewed and is accurate.    Evalee Jefferson, PA-C 01/18/14 2235

## 2014-01-16 NOTE — ED Notes (Signed)
Pt c/o pain that starts at right scapula area and radiates down right arm that started a few days ago, denies any injury, states that she does have pain in posterior neck area that started "awhile ago", cms intact all extremities.

## 2014-01-16 NOTE — Discharge Instructions (Signed)

## 2014-01-16 NOTE — ED Notes (Signed)
Julie PA at bedside,  

## 2014-01-16 NOTE — ED Notes (Signed)
Pt c/o muscle spasms that are constant since Tuesday to right upper back and radiates to right arm, pt had a massage recently and was told that her muscles are very tight

## 2014-01-19 NOTE — ED Provider Notes (Signed)
Medical screening examination/treatment/procedure(s) were performed by non-physician practitioner and as supervising physician I was immediately available for consultation/collaboration.   EKG Interpretation None       Virgel Manifold, MD 01/19/14 1236

## 2014-01-21 ENCOUNTER — Encounter (HOSPITAL_COMMUNITY): Payer: Self-pay | Admitting: Emergency Medicine

## 2014-01-21 ENCOUNTER — Emergency Department (HOSPITAL_COMMUNITY)
Admission: EM | Admit: 2014-01-21 | Discharge: 2014-01-21 | Disposition: A | Discharge status: Home / Self Care | Payer: Medicare Other | Attending: Emergency Medicine | Admitting: Emergency Medicine

## 2014-01-21 DIAGNOSIS — F411 Generalized anxiety disorder: Secondary | ICD-10-CM | POA: Insufficient documentation

## 2014-01-21 DIAGNOSIS — Z8739 Personal history of other diseases of the musculoskeletal system and connective tissue: Secondary | ICD-10-CM | POA: Diagnosis not present

## 2014-01-21 DIAGNOSIS — Z87891 Personal history of nicotine dependence: Secondary | ICD-10-CM | POA: Diagnosis not present

## 2014-01-21 DIAGNOSIS — E785 Hyperlipidemia, unspecified: Secondary | ICD-10-CM | POA: Insufficient documentation

## 2014-01-21 DIAGNOSIS — E669 Obesity, unspecified: Secondary | ICD-10-CM | POA: Insufficient documentation

## 2014-01-21 DIAGNOSIS — I10 Essential (primary) hypertension: Secondary | ICD-10-CM | POA: Insufficient documentation

## 2014-01-21 DIAGNOSIS — Z7902 Long term (current) use of antithrombotics/antiplatelets: Secondary | ICD-10-CM | POA: Insufficient documentation

## 2014-01-21 DIAGNOSIS — Z794 Long term (current) use of insulin: Secondary | ICD-10-CM | POA: Diagnosis not present

## 2014-01-21 DIAGNOSIS — M79609 Pain in unspecified limb: Secondary | ICD-10-CM | POA: Diagnosis present

## 2014-01-21 DIAGNOSIS — M5412 Radiculopathy, cervical region: Secondary | ICD-10-CM | POA: Diagnosis not present

## 2014-01-21 DIAGNOSIS — Z8719 Personal history of other diseases of the digestive system: Secondary | ICD-10-CM | POA: Insufficient documentation

## 2014-01-21 DIAGNOSIS — Z8673 Personal history of transient ischemic attack (TIA), and cerebral infarction without residual deficits: Secondary | ICD-10-CM | POA: Insufficient documentation

## 2014-01-21 DIAGNOSIS — Q391 Atresia of esophagus with tracheo-esophageal fistula: Secondary | ICD-10-CM | POA: Insufficient documentation

## 2014-01-21 DIAGNOSIS — Z8701 Personal history of pneumonia (recurrent): Secondary | ICD-10-CM | POA: Diagnosis not present

## 2014-01-21 DIAGNOSIS — IMO0002 Reserved for concepts with insufficient information to code with codable children: Secondary | ICD-10-CM | POA: Diagnosis not present

## 2014-01-21 DIAGNOSIS — G43909 Migraine, unspecified, not intractable, without status migrainosus: Secondary | ICD-10-CM | POA: Insufficient documentation

## 2014-01-21 DIAGNOSIS — Z8543 Personal history of malignant neoplasm of ovary: Secondary | ICD-10-CM | POA: Diagnosis not present

## 2014-01-21 DIAGNOSIS — Q393 Congenital stenosis and stricture of esophagus: Secondary | ICD-10-CM

## 2014-01-21 DIAGNOSIS — J45909 Unspecified asthma, uncomplicated: Secondary | ICD-10-CM | POA: Diagnosis not present

## 2014-01-21 DIAGNOSIS — K219 Gastro-esophageal reflux disease without esophagitis: Secondary | ICD-10-CM | POA: Diagnosis not present

## 2014-01-21 DIAGNOSIS — E119 Type 2 diabetes mellitus without complications: Secondary | ICD-10-CM | POA: Insufficient documentation

## 2014-01-21 DIAGNOSIS — Z862 Personal history of diseases of the blood and blood-forming organs and certain disorders involving the immune mechanism: Secondary | ICD-10-CM | POA: Insufficient documentation

## 2014-01-21 MED ORDER — DIAZEPAM 5 MG PO TABS
5.0000 mg | ORAL_TABLET | Freq: Once | ORAL | Status: AC
  Administered 2014-01-21: 5 mg via ORAL
  Filled 2014-01-21: qty 1

## 2014-01-21 MED ORDER — DEXAMETHASONE SODIUM PHOSPHATE 4 MG/ML IJ SOLN
8.0000 mg | Freq: Once | INTRAMUSCULAR | Status: AC
  Administered 2014-01-21: 8 mg via INTRAMUSCULAR
  Filled 2014-01-21: qty 2

## 2014-01-21 MED ORDER — HYDROCODONE-ACETAMINOPHEN 5-325 MG PO TABS
1.0000 | ORAL_TABLET | Freq: Once | ORAL | Status: AC
  Administered 2014-01-21: 1 via ORAL
  Filled 2014-01-21: qty 1

## 2014-01-21 MED ORDER — IBUPROFEN 800 MG PO TABS: 800.0000 mg | ORAL_TABLET | Freq: Once | ORAL | Status: DC

## 2014-01-21 NOTE — ED Provider Notes (Signed)
CSN: [id number]     Arrival date & time 01/21/14  1250 History   First MD Initiated Contact with Patient 01/21/14 1343     Chief Complaint  Patient presents with  . Spasms     (Consider location/radiation/quality/duration/timing/severity/associated sxs/prior Treatment) HPI Comments: Patient presents to the emergency department with right arm pain, and pain in the right under arm area. The patient was seen for this problem about 5 days ago at which time she was diagnosed with a musculoskeletal pain and placed on muscle relaxers. She states these did not work. She attempted to see her primary care physician, but was unable to because they closed early today. She presents now because she has pain that would not respond to the medications that she was given Norco additional over-the-counter medications. The patient has tried stretching and this too has not been successful in helping her pain. The patient describes a tingling burning-type sensation from the upper shoulder to the lower shoulder blade area, and then under the arm to the base of the breast area. The pain is aggravated by certain movements. His been no injury or trauma to the area. Patient has not had any recent operations involving the shoulder. It is of note that she has had fusion surgery involving the cervical spine because of degenerative disc problems. She states that frequently the pain keeps her up at night, and is now beginning to limit her activities of daily living.          Right shoulder blade to the the right arm. Yesterday at underarm and under the breast. In ED Sunday.  The history is provided by the patient.    Past Medical History  Diagnosis Date  . Hypertension   . Anxiety   . Hyperlipidemia   . GERD (gastroesophageal reflux disease)   . Irritable bowel syndrome   . Sinus problem   . Hidradenitis     groin  . Anemia   . Schatzki\'s ring   . Diverticulosis of colon with hemorrhage April 2013  . Stomach  ulcer 1972    non-bleeding  . Chronic asthmatic bronchitis   . Obesity   . Left-sided weakness     since stroke, left eye trouble seeing  . Cancer 1985    ovarian, no treatment except surgery  . Anginal pain   . Pneumonia ~ 2011  . Chronic bronchitis     "I get it q yr" (09/14/2012)  . Type II diabetes mellitus   . H/O hiatal hernia   . Chronic headaches     "daily lately; sometimes monthly" (09/14/2012)  . Migraines   . Arthritis     "neck, left hand" (09/14/2012)  . Chest pain 09-11-2013    LEFT SIDED  . Heart attack     20 03 mild MI, March 2013 mild MI  . Stroke 07-2007, 07-2008, 07-2009    total 3 strokes  . History of blood transfusion 1985 AND 2013  . Complication of anesthesia     OCCASIONAL TROUBLE TURNING NECK TO RIGHT  . Vitamin B 12 deficiency 07-15-2013   Past Surgical History  Procedure Laterality Date  . Hiatal hernia repair    . Breast reduction surgery    . Axillary hidradenitis excision  1990-2008    bilateral  . Hydradenitis excision  01/2011; 03/2012    'groin and abdomen; 03/2012" (09/14/2012)  . Anterior cervical decomp/discectomy fusion  2002  . Colonoscopy  08/12/2011    Procedure: COLONOSCOPY;  Surgeon: Ladene Artist, MD,FACG;  Location: [facility];  Service: Endoscopy;  Laterality: N/A;  . Esophagogastroduodenoscopy  08/12/2011    Procedure: ESOPHAGOGASTRODUODENOSCOPY (EGD);  Surgeon: Ladene Artist, MD,FACG;  Location: [facility] ENDOSCOPY;  Service: Endoscopy;  Laterality: N/A;  . Givens capsule study  08/13/2011    Procedure: GIVENS CAPSULE STUDY;  Surgeon: Ladene Artist, MD,FACG;  Location: [place];  Service: Endoscopy;  Laterality: N/A;  . Nissen fundoplication  3235  . Uvulopalatopharyngoplasty, tonsillectomy and septoplasty  2000's  . Hammer toe surgery Bilateral ~ 2000  . Colonoscopy  06/19/2006  . Esophagogastroduodenoscopy  06/04/2005  . Cardiac catheterization  2004    mild disease  . Back surgery    . Abdominal hysterectomy  1985  .  Cholecystectomy  1990's  . Hydradenitis excision  04/01/2012    Procedure: EXCISION HYDRADENITIS GROIN;  Surgeon: Pedro Earls, MD;  Location: [facility] ORS;  Service: General;  Laterality: Bilateral;  Excision of Hydradenitis of Perineum  . Appendectomy  1985  . Breast cyst excision Right 2008  . Reduction mammaplasty  1996?  Marland Kitchen Breast biopsy Right 2007  . Posterior lumbar fusion  2008 X 2  . Tonsillectomy and adenoidectomy  1959 AND 2000  . Hydradenitis excision N/A 09/17/2013    Procedure: EXCISION PERINEAL HIDRADENITIS ;  Surgeon: Pedro Earls, MD;  Location: [facility] OR[facility];  Service: General;  Laterality: N/A;  also in the pubis area  . Mass excision Right 09/17/2013    Procedure: EXCISION MASS;  Surgeon: Pedro Earls, MD;  Location: [facility];  Service: General;  Laterality: Right;   Family History  Problem Relation Age of Onset  . Breast cancer Mother   . Heart disease Mother   . Hypertension Mother   . Diabetes Father   . Heart disease Father   . Stroke Father   . Heart disease Sister   . Hypertension Sister   . Hypertension Sister   . Hyperlipidemia Sister   . Diabetes Sister   . Emphysema      great uncle  . Aneurysm Sister     brain   History  Substance Use Topics  . Smoking status: Former Smoker -- 0.33 packs/day for 41 years    Types: Cigarettes    Quit date: 07/25/2013  . Smokeless tobacco: Never Used  . Alcohol Use: No   OB History   Grav Para Term Preterm Abortions TAB SAB Ect Mult Living                 Review of Systems  Constitutional: Negative for activity change.       All ROS Neg except as noted in HPI  HENT: Negative for nosebleeds.   Eyes: Negative for photophobia and discharge.  Respiratory: Negative for cough, shortness of breath and wheezing.   Cardiovascular: Negative for chest pain and palpitations.  Gastrointestinal: Negative for abdominal pain and blood in stool.  Genitourinary: Negative for dysuria, frequency and hematuria.   Musculoskeletal: Positive for arthralgias, neck pain and neck stiffness. Negative for back pain.  Skin: Negative.   Neurological: Positive for headaches. Negative for dizziness, seizures and speech difficulty.  Psychiatric/Behavioral: Negative for hallucinations and confusion. The patient is nervous/anxious.       Allergies  Bactrim; Sulfa antibiotics; Iodine; Metformin and related; Adhesive; and Shellfish allergy  Home Medications   Prior to Admission medications   Medication Sig Start Date End Date Taking? Authorizing Provider  albuterol (PROVENTIL HFA;VENTOLIN HFA) 108 (90 BASE) MCG/ACT inhaler Inhale 2 puffs into the lungs every  6 (six) hours as needed for wheezing.    Historical Provider, MD  ALPRAZolam Duanne Moron) 0.5 MG tablet Take 0.5 mg by mouth at bedtime as needed. For anxiety    Historical Provider, MD  Calcium Citrate-Vitamin D (CALCITRATE/VITAMIN D PO) Take 1 tablet by mouth daily.    Historical Provider, MD  clopidogrel (PLAVIX) 75 MG tablet Take 75 mg by mouth daily.    Historical Provider, MD  Cyanocobalamin (VITAMIN B 12 PO) Take 2 tablets by mouth daily.    Historical Provider, MD  doxycycline (VIBRAMYCIN) 100 MG capsule Take 1 capsule (100 mg total) by mouth 2 (two) times daily. 12/24/13   Kaylyn Lim, MD  HYDROcodone-acetaminophen (NORCO/VICODIN) 5-325 MG per tablet Take 0.5 tablets by mouth daily as needed for moderate pain.    Historical Provider, MD  ibuprofen (ADVIL,MOTRIN) 800 MG tablet Take 800 mg by mouth 2 (two) times daily as needed for headache or moderate pain.    Historical Provider, MD  insulin lispro protamine-lispro (HUMALOG 75/25 MIX) (75-25) 100 UNIT/ML SUSP injection Inject 15-28 Units into the skin 3 (three) times daily. Per sliding scale.    Historical Provider, MD  loratadine (CLARITIN) 10 MG tablet Take 10 mg by mouth daily as needed for allergies.    Historical Provider, MD  methocarbamol (ROBAXIN) 500 MG tablet Take 2 tablets (1,000 mg total) by  mouth 4 (four) times daily. 01/16/14 01/26/14  Evalee Jefferson, PA-C  mometasone (NASONEX) 50 MCG/ACT nasal spray Place 1 spray into the nose daily as needed. For allergies    Historical Provider, MD  mupirocin cream (BACTROBAN) 2 % Apply 1 application topically 2 (two) times daily.    Historical Provider, MD  nitroGLYCERIN (NITRODUR - DOSED IN MG/24 HR) 0.4 mg/hr Place 1 patch onto the skin as needed. Chest pains    Historical Provider, MD  nitroGLYCERIN (NITROSTAT) 0.4 MG SL tablet Place 0.4 mg under the tongue every 5 (five) minutes as needed. Chest pains    Historical Provider, MD  Olmesartan-Amlodipine-HCTZ (TRIBENZOR) 40-5-12.5 MG TABS Take 1 tablet by mouth every morning.     Historical Provider, MD  ONE TOUCH ULTRA TEST test strip  09/10/13   Historical Provider, MD  potassium chloride SA (K-DUR,KLOR-CON) 20 MEQ tablet Take 20 mEq by mouth daily as needed (for cramps).    Historical Provider, MD  rosuvastatin (CRESTOR) 20 MG tablet Take 20 mg by mouth every morning.     Historical Provider, MD  SUMAtriptan (IMITREX) 50 MG tablet Take 50 mg by mouth every 2 (two) hours as needed for migraine.    Historical Provider, MD  zolpidem (AMBIEN) 10 MG tablet Take 5 mg by mouth at bedtime as needed. FOR SLEEP    Historical Provider, MD   BP 125/75  Pulse 112  Temp(Src) 98.4 F (36.9 C) (Oral)  Resp 18  Ht 5\' 4"  (1.626 m)  Wt 137 lb (62.143 kg)  BMI 23.50 kg/m2  SpO2 98% Physical Exam  Nursing note and vitals reviewed. Constitutional: She is oriented to person, place, and time. She appears well-developed and well-nourished.  Non-toxic appearance.  HENT:  Head: Normocephalic.  Right Ear: Tympanic membrane and external ear normal.  Left Ear: Tympanic membrane and external ear normal.  Eyes: EOM and lids are normal. Pupils are equal, round, and reactive to light.  Neck: Normal range of motion. Neck supple. Carotid bruit is not present.  Cardiovascular: Normal rate, regular rhythm, normal heart sounds,  intact distal pulses and normal pulses.   Pulmonary/Chest: Breath  sounds normal. No respiratory distress.  Abdominal: Soft. Bowel sounds are normal. There is no tenderness. There is no guarding.  Musculoskeletal: Normal range of motion.  There is pain and tenseness and tightness of the upper trapezius. There is also pain to palpation just below the scapula on the right. There is pain from the distal portion of the scapula to the axilla to palpation. There is pain with attempted range of motion of the right shoulder. There no hot joints appreciated.  The radial pulses are 2+ bilaterally. The capillary refill is less than 2 seconds bilaterally. There is no atrophy noted of the biceps or triceps area. There is pain to palpation along the triceps area on the right.  Lymphadenopathy:       Head (right side): No submandibular adenopathy present.       Head (left side): No submandibular adenopathy present.    She has no cervical adenopathy.  Neurological: She is alert and oriented to person, place, and time. She has normal strength. No cranial nerve deficit or sensory deficit.  Grip is symmetrical. There no sensory deficits of the upper extremities. There is no atrophy of the thenar eminences or the muscles of the upper extremities.  Skin: Skin is warm and dry.  Psychiatric: She has a normal mood and affect. Her speech is normal.    ED Course  Procedures (including critical care time) Labs Review Labs Reviewed - No data to display  Imaging Review No results found.   EKG Interpretation None      MDM  The pulse rate is 112, vital signs are otherwise within normal limits. There no gross neurologic deficits appreciated. The patient describes a tingling, burning kind of pain involving the right upper extremity. Review of the previous x-rays reveals degenerative disc changes involving the cervical spine area. Patient has had fusion surgery 4 previous back related issues. Suspect the patient is  having a cervical radiculopathy.  Patient strongly advised to see her orthopedic or neurology specialist. Patient will be treated with muscle relaxer, Norco, and Decadron injection. Patient is in agreement with the discharge plan any knowledge is the same.    Final diagnoses:  None    *I have reviewed nursing notes, vital signs, and all appropriate lab and imaging results for this patient.Lenox Ahr, PA-C 01/21/14 1807

## 2014-01-21 NOTE — ED Notes (Signed)
Pain rt shoulder, scapula , seen here on Sunday, says no better.  Has been taking muscle relaxants

## 2014-01-21 NOTE — Discharge Instructions (Signed)
Please change the Robaxin to Valium 5 mg 3 times daily for spasm pain. Please do not use the ibuprofen while you are on the Plavix. Please use dexamethasone 2 times daily with food. You may continue your other medications as prescribed. It is important that you see your orthopedic specialist for additional evaluation concerning your cervical spine. Cervical Radiculopathy Cervical radiculopathy happens when a nerve in the neck is pinched or bruised by a slipped (herniated) disk or by arthritic changes in the bones of the cervical spine. This can occur due to an injury or as part of the normal aging process. Pressure on the cervical nerves can cause pain or numbness that runs from your neck all the way down into your arm and fingers. CAUSES  There are many possible causes, including:  Injury.  Muscle tightness in the neck from overuse.  Swollen, painful joints (arthritis).  Breakdown or degeneration in the bones and joints of the spine (spondylosis) due to aging.  Bone spurs that may develop near the cervical nerves. SYMPTOMS  Symptoms include pain, weakness, or numbness in the affected arm and hand. Pain can be severe or irritating. Symptoms may be worse when extending or turning the neck. DIAGNOSIS  Your caregiver will ask about your symptoms and do a physical exam. He or she may test your strength and reflexes. X-rays, CT scans, and MRI scans may be needed in cases of injury or if the symptoms do not go away after a period of time. Electromyography (EMG) or nerve conduction testing may be done to study how your nerves and muscles are working. TREATMENT  Your caregiver may recommend certain exercises to help relieve your symptoms. Cervical radiculopathy can, and often does, get better with time and treatment. If your problems continue, treatment options may include:  Wearing a soft collar for short periods of time.  Physical therapy to strengthen the neck muscles.  Medicines, such as  nonsteroidal anti-inflammatory drugs (NSAIDs), oral corticosteroids, or spinal injections.  Surgery. Different types of surgery may be done depending on the cause of your problems. HOME CARE INSTRUCTIONS   Put ice on the affected area.  Put ice in a plastic bag.  Place a towel between your skin and the bag.  Leave the ice on for 15-20 minutes, 03-04 times a day or as directed by your caregiver.  If ice does not help, you can try using heat. Take a warm shower or bath, or use a hot water bottle as directed by your caregiver.  You may try a gentle neck and shoulder massage.  Use a flat pillow when you sleep.  Only take over-the-counter or prescription medicines for pain, discomfort, or fever as directed by your caregiver.  If physical therapy was prescribed, follow your caregiver's directions.  If a soft collar was prescribed, use it as directed. SEEK IMMEDIATE MEDICAL CARE IF:   Your pain gets much worse and cannot be controlled with medicines.  You have weakness or numbness in your hand, arm, face, or leg.  You have a high fever or a stiff, rigid neck.  You lose bowel or bladder control (incontinence).  You have trouble with walking, balance, or speaking. MAKE SURE YOU:   Understand these instructions.  Will watch your condition.  Will get help right away if you are not doing well or get worse. Document Released: 01/15/2001 Document Revised: 07/15/2011 Document Reviewed: 12/04/2010 [facility] Patient Information 2015 [place], [place]. This information is not intended to replace advice given to you by your  health care provider. Make sure you discuss any questions you have with your health care provider. ° °

## 2014-01-21 NOTE — ED Notes (Addendum)
Pt reports seen for same on Sunday. Pt reports continued pain. Pt denies that pcp would see her. nad noted. Pt denies any known injury. Pt reports took muscle relaxer with no relief.

## 2014-01-24 NOTE — ED Provider Notes (Signed)
Medical screening examination/treatment/procedure(s) were performed by non-physician practitioner and as supervising physician I was immediately available for consultation/collaboration.   EKG Interpretation None       Nat Christen, MD 01/24/14 1839

## 2014-02-07 ENCOUNTER — Other Ambulatory Visit: Payer: Self-pay | Admitting: Internal Medicine

## 2014-02-07 DIAGNOSIS — Z1231 Encounter for screening mammogram for malignant neoplasm of breast: Secondary | ICD-10-CM

## 2014-02-07 DIAGNOSIS — Z139 Encounter for screening, unspecified: Secondary | ICD-10-CM

## 2014-02-15 ENCOUNTER — Other Ambulatory Visit: Payer: Self-pay | Admitting: Internal Medicine

## 2014-02-15 ENCOUNTER — Ambulatory Visit (HOSPITAL_COMMUNITY)
Admission: RE | Admit: 2014-02-15 | Discharge: 2014-02-15 | Disposition: A | Discharge status: Home / Self Care | Payer: Medicare Other | Source: Ambulatory Visit | Attending: Internal Medicine | Admitting: Internal Medicine

## 2014-02-15 DIAGNOSIS — M549 Dorsalgia, unspecified: Secondary | ICD-10-CM | POA: Diagnosis not present

## 2014-02-15 DIAGNOSIS — M25511 Pain in right shoulder: Secondary | ICD-10-CM | POA: Insufficient documentation

## 2014-02-15 DIAGNOSIS — M419 Scoliosis, unspecified: Secondary | ICD-10-CM | POA: Diagnosis not present

## 2014-02-24 ENCOUNTER — Other Ambulatory Visit: Payer: Self-pay | Admitting: Internal Medicine

## 2014-02-24 ENCOUNTER — Ambulatory Visit (HOSPITAL_COMMUNITY): Payer: Medicare Other

## 2014-02-24 DIAGNOSIS — R52 Pain, unspecified: Secondary | ICD-10-CM

## 2014-02-24 DIAGNOSIS — N644 Mastodynia: Secondary | ICD-10-CM

## 2014-03-08 ENCOUNTER — Other Ambulatory Visit: Payer: Self-pay | Admitting: Internal Medicine

## 2014-03-08 ENCOUNTER — Ambulatory Visit (HOSPITAL_COMMUNITY)
Admission: RE | Admit: 2014-03-08 | Discharge: 2014-03-08 | Disposition: A | Discharge status: Home / Self Care | Payer: Medicare Other | Source: Ambulatory Visit | Attending: Internal Medicine | Admitting: Internal Medicine

## 2014-03-08 DIAGNOSIS — N644 Mastodynia: Secondary | ICD-10-CM | POA: Insufficient documentation

## 2014-03-09 ENCOUNTER — Other Ambulatory Visit (HOSPITAL_COMMUNITY): Payer: Self-pay | Admitting: Neurosurgery

## 2014-03-09 DIAGNOSIS — M5412 Radiculopathy, cervical region: Secondary | ICD-10-CM

## 2014-03-11 ENCOUNTER — Encounter (HOSPITAL_COMMUNITY): Payer: Self-pay

## 2014-03-11 ENCOUNTER — Ambulatory Visit (HOSPITAL_COMMUNITY)
Admission: RE | Admit: 2014-03-11 | Discharge: 2014-03-11 | Disposition: A | Discharge status: Home / Self Care | Payer: Medicare Other | Source: Ambulatory Visit | Attending: Neurosurgery | Admitting: Neurosurgery

## 2014-03-11 DIAGNOSIS — R531 Weakness: Secondary | ICD-10-CM | POA: Diagnosis not present

## 2014-03-11 DIAGNOSIS — M5412 Radiculopathy, cervical region: Secondary | ICD-10-CM

## 2014-03-11 DIAGNOSIS — M5022 Other cervical disc displacement, mid-cervical region: Secondary | ICD-10-CM | POA: Insufficient documentation

## 2014-03-11 DIAGNOSIS — M4802 Spinal stenosis, cervical region: Secondary | ICD-10-CM | POA: Insufficient documentation

## 2014-03-11 DIAGNOSIS — M542 Cervicalgia: Secondary | ICD-10-CM | POA: Diagnosis present

## 2014-03-11 DIAGNOSIS — Z981 Arthrodesis status: Secondary | ICD-10-CM | POA: Diagnosis not present

## 2014-03-14 ENCOUNTER — Other Ambulatory Visit (HOSPITAL_COMMUNITY): Payer: Self-pay | Admitting: Neurosurgery

## 2014-03-17 ENCOUNTER — Telehealth: Payer: Self-pay | Admitting: Neurology

## 2014-03-17 NOTE — Telephone Encounter (Signed)
Pt canceled the new patient appt on 04-11-14 notified the referring dr

## 2014-03-28 ENCOUNTER — Encounter (HOSPITAL_COMMUNITY): Payer: Self-pay

## 2014-03-28 ENCOUNTER — Encounter (HOSPITAL_COMMUNITY)
Admission: RE | Admit: 2014-03-28 | Discharge: 2014-03-28 | Disposition: A | Discharge status: Home / Self Care | Payer: Medicare Other | Source: Ambulatory Visit | Attending: Neurosurgery | Admitting: Neurosurgery

## 2014-03-28 DIAGNOSIS — K589 Irritable bowel syndrome without diarrhea: Secondary | ICD-10-CM | POA: Diagnosis not present

## 2014-03-28 DIAGNOSIS — J45909 Unspecified asthma, uncomplicated: Secondary | ICD-10-CM | POA: Insufficient documentation

## 2014-03-28 DIAGNOSIS — Z90710 Acquired absence of both cervix and uterus: Secondary | ICD-10-CM | POA: Insufficient documentation

## 2014-03-28 DIAGNOSIS — G43909 Migraine, unspecified, not intractable, without status migrainosus: Secondary | ICD-10-CM | POA: Diagnosis not present

## 2014-03-28 DIAGNOSIS — E785 Hyperlipidemia, unspecified: Secondary | ICD-10-CM | POA: Diagnosis not present

## 2014-03-28 DIAGNOSIS — F419 Anxiety disorder, unspecified: Secondary | ICD-10-CM | POA: Insufficient documentation

## 2014-03-28 DIAGNOSIS — I498 Other specified cardiac arrhythmias: Secondary | ICD-10-CM | POA: Diagnosis not present

## 2014-03-28 DIAGNOSIS — I252 Old myocardial infarction: Secondary | ICD-10-CM | POA: Diagnosis not present

## 2014-03-28 DIAGNOSIS — I052 Rheumatic mitral stenosis with insufficiency: Secondary | ICD-10-CM | POA: Insufficient documentation

## 2014-03-28 DIAGNOSIS — D649 Anemia, unspecified: Secondary | ICD-10-CM | POA: Diagnosis not present

## 2014-03-28 DIAGNOSIS — Z01818 Encounter for other preprocedural examination: Secondary | ICD-10-CM | POA: Insufficient documentation

## 2014-03-28 DIAGNOSIS — I2510 Atherosclerotic heart disease of native coronary artery without angina pectoris: Secondary | ICD-10-CM | POA: Diagnosis not present

## 2014-03-28 DIAGNOSIS — I10 Essential (primary) hypertension: Secondary | ICD-10-CM | POA: Diagnosis not present

## 2014-03-28 DIAGNOSIS — I517 Cardiomegaly: Secondary | ICD-10-CM | POA: Insufficient documentation

## 2014-03-28 DIAGNOSIS — Z981 Arthrodesis status: Secondary | ICD-10-CM | POA: Diagnosis not present

## 2014-03-28 DIAGNOSIS — Z720 Tobacco use: Secondary | ICD-10-CM | POA: Diagnosis not present

## 2014-03-28 DIAGNOSIS — I69354 Hemiplegia and hemiparesis following cerebral infarction affecting left non-dominant side: Secondary | ICD-10-CM | POA: Diagnosis not present

## 2014-03-28 DIAGNOSIS — L732 Hidradenitis suppurativa: Secondary | ICD-10-CM | POA: Diagnosis not present

## 2014-03-28 DIAGNOSIS — C569 Malignant neoplasm of unspecified ovary: Secondary | ICD-10-CM | POA: Insufficient documentation

## 2014-03-28 DIAGNOSIS — E119 Type 2 diabetes mellitus without complications: Secondary | ICD-10-CM | POA: Insufficient documentation

## 2014-03-28 DIAGNOSIS — J449 Chronic obstructive pulmonary disease, unspecified: Secondary | ICD-10-CM | POA: Insufficient documentation

## 2014-03-28 DIAGNOSIS — K219 Gastro-esophageal reflux disease without esophagitis: Secondary | ICD-10-CM | POA: Diagnosis not present

## 2014-03-28 DIAGNOSIS — I071 Rheumatic tricuspid insufficiency: Secondary | ICD-10-CM | POA: Diagnosis not present

## 2014-03-28 LAB — CBC
HCT: 38.2 % (ref 36.0–46.0)
Hemoglobin: 12.9 g/dL (ref 12.0–15.0)
MCH: 27.7 pg (ref 26.0–34.0)
MCHC: 33.8 g/dL (ref 30.0–36.0)
MCV: 82 fL (ref 78.0–100.0)
Platelets: 230 10*3/uL (ref 150–400)
RBC: 4.66 MIL/uL (ref 3.87–5.11)
RDW: 13.4 % (ref 11.5–15.5)
WBC: 5.8 10*3/uL (ref 4.0–10.5)

## 2014-03-28 LAB — BASIC METABOLIC PANEL
Anion gap: 17 — ABNORMAL HIGH (ref 5–15)
BUN: 9 mg/dL (ref 6–23)
CO2: 23 mEq/L (ref 19–32)
Calcium: 9.7 mg/dL (ref 8.4–10.5)
Chloride: 101 mEq/L (ref 96–112)
Creatinine, Ser: 0.69 mg/dL (ref 0.50–1.10)
GFR calc Af Amer: >90 mL/min (ref >90)
GFR calc non Af Amer: >90 mL/min (ref >90)
Glucose, Bld: 176 mg/dL — ABNORMAL HIGH (ref 70–99)
Potassium: 3.5 mEq/L — ABNORMAL LOW (ref 3.7–5.3)
Sodium: 141 mEq/L (ref 137–147)

## 2014-03-28 LAB — SURGICAL PCR SCREEN
MRSA, PCR: NEGATIVE
Staphylococcus aureus: NEGATIVE

## 2014-03-28 NOTE — Pre-Procedure Instructions (Signed)
[name]  03/28/2014   Your procedure is scheduled on:  04/08/14  Report to [facility] Admitting at 1230 pm  Call this number if you have problems the morning of surgery: [phone]   Remember:   Do not eat food or drink liquids after midnight.   Take these medicines the morning of surgery with A SIP OF WATER: all inhalers,xanax,valium,nexium,hydrocodone,claritin   Do not wear jewelry, make-up or nail polish.  Do not wear lotions, powders, or perfumes. You may wear deodorant.  Do not shave 48 hours prior to surgery. Men may shave face and neck.  Do not bring valuables to the hospital.  [facility] is not responsible                  for any belongings or valuables.               Contacts, dentures or bridgework may not be worn into surgery.  Leave suitcase in the car. After surgery it may be brought to your room.  For patients admitted to the hospital, discharge time is determined by your                treatment team.               Patients discharged the day of surgery will not be allowed to drive  home.  Name and phone number of your driver:   Special Instructions: Incentive Spirometry - Practice and bring it with you on the day of surgery.   Please read over the following fact sheets that you were given: Pain Booklet, Coughing and Deep Breathing, Blood Transfusion Information, MRSA Information and Surgical Site Infection Prevention

## 2014-03-28 NOTE — Progress Notes (Signed)
Pt will see Dr Doylene Canard today for clearance. She was told to stop plavix 11/28.   Also has recurrent hidradenitis cysts in groins that are open. Will request last ekg and clearance.

## 2014-03-29 NOTE — Progress Notes (Addendum)
Anesthesia Chart Review:  Patient is a 57 year old female scheduled for C6-7 ACDF on 04/08/14 by Dr. Christella Noa.  History includes smoking, HTN, HLD, DM2, chest pain history with reported MI '03 and '13 (multiple negative troponins in Epic since 07/2007 - 09/19/13) and only mild CAD by 2007 cath, CVA (TIA ?) in '09 '10 '11 (reported left hemiparesis; brain MRI showed no acute abnormality on 05/22/05, 07/09/07, 07/11/08, 09/14/12), asthmatic bronchitis, COPD, GERD, hiatal hernia repair, anemia, anxiety, migraines, IBS, ovarian cancer, hysterectomy, hidradenitis s/p perineal and groin excision hidradenitis, PLIF, ACDF, breast reduction, s/p UPPP, tonsillectomy. She reports occasion difficulty turning her neck to the right. PCP is listed as Dr. Jeanann Lewandowsky. She reported that she is seeing cardiologist Dr. Doylene Canard on 03/28/14 for "clearance.". She reported being instructed to hold Plavix starting 04/02/14 for surgery.   EKG on 09/19/13: SR with marked sinus arrhythmia/PACs.  Echo on 09/15/12:  - Left ventricle: The cavity size was normal. There was mild concentric hypertrophy. Systolic function was normal. The estimated ejection fraction was in the range of 60% to 65%. Wall motion was normal; there were no regional wall motion abnormalities. - Mitral valve: Calcified annulus. Mild regurgitation. - Pulmonary arteries: Systolic pressure was mildly increased. PA peak pressure: 67mm Hg (S). - Tricuspid valve: Mild regurgitation.  Nuclear stress test on 07/07/11:  1. No reversible ischemia or infarction. 2. Normal wall motion. 3. Left ventricular ejection fraction equal 71 %.  Cardiac cath on 10/04/05: LM unremarkable. LAD showed proximal calcification with mild luminal irregularities and mild mid vessel concentric narrowing. D1 and D3 were small.  D2 was unremarkable. LCX was unremarkable with a large OM1 and smaller OM2. RCA was dominant with proximal gradula tapering leading to 30% lesion and mid  vessel 20-30% lesions X 2. PLA unremarkable. PAD was a narrowing vessel without visible disease. LVEF 65%. Medical therapy.  Carotid duplex 07/22/13: Plaque at both carotid bifurcations, right greater than left. No significant carotid stenosis is identified with bilateral ICA stenoses of less than 50%.  09/13/13 CXR: No active cardiopulmonary disease.  Preoperative labs noted.    Anesthesia follow-up once records received from Dr. Doylene Canard.   [facility] Short Stay Center/Anesthesiology Phone [phone] 03/29/2014 2:01 PM  Addendum:   Received cardiac clearance from Dr. Doylene Canard; pt "can undergo spinal surgery with usual risks."   Echo 03/28/2014: -Normal LV size and systolic function. Mild LVH. Mild diastolic dysfunction. EF 70% -Normal RV size and systolic function. -Normal LA and RA size.  -Normal MV, TV, AV, PV.  -No intracardiac mass.  -No pericardial effusion.  I anticipate pt can proceed with surgery as scheduled.   Willeen Cass, FNP-BC [facility] Short Stay Surgical Center/Anesthesiology Phone: [phone] 04/04/2014 3:45 PM

## 2014-04-07 ENCOUNTER — Other Ambulatory Visit (INDEPENDENT_AMBULATORY_CARE_PROVIDER_SITE_OTHER): Payer: Self-pay

## 2014-04-07 DIAGNOSIS — L732 Hidradenitis suppurativa: Secondary | ICD-10-CM

## 2014-04-07 MED ORDER — CEFAZOLIN SODIUM-DEXTROSE 2-3 GM-% IV SOLR
2.0000 g | INTRAVENOUS | Status: AC
Start: 2014-04-08 — End: 2014-04-08
  Administered 2014-04-08: 2 g via INTRAVENOUS
  Filled 2014-04-07: qty 50

## 2014-04-08 ENCOUNTER — Inpatient Hospital Stay (HOSPITAL_COMMUNITY): Payer: Medicare Other

## 2014-04-08 ENCOUNTER — Encounter (HOSPITAL_COMMUNITY): Payer: Self-pay | Admitting: *Deleted

## 2014-04-08 ENCOUNTER — Inpatient Hospital Stay (HOSPITAL_COMMUNITY): Payer: Medicare Other | Admitting: Vascular Surgery

## 2014-04-08 ENCOUNTER — Inpatient Hospital Stay (HOSPITAL_COMMUNITY)
Admission: RE | Admit: 2014-04-08 | Discharge: 2014-04-11 | DRG: 473 | Disposition: A | Discharge status: Home / Self Care | Payer: Medicare Other | Source: Ambulatory Visit | Attending: Neurosurgery | Admitting: Neurosurgery

## 2014-04-08 ENCOUNTER — Encounter (HOSPITAL_COMMUNITY)
Admission: RE | Disposition: A | Discharge status: Home / Self Care | Payer: Self-pay | Source: Ambulatory Visit | Attending: Neurosurgery

## 2014-04-08 ENCOUNTER — Inpatient Hospital Stay (HOSPITAL_COMMUNITY): Payer: Medicare Other | Admitting: Anesthesiology

## 2014-04-08 DIAGNOSIS — L732 Hidradenitis suppurativa: Secondary | ICD-10-CM | POA: Diagnosis present

## 2014-04-08 DIAGNOSIS — K219 Gastro-esophageal reflux disease without esophagitis: Secondary | ICD-10-CM | POA: Diagnosis present

## 2014-04-08 DIAGNOSIS — Z8711 Personal history of peptic ulcer disease: Secondary | ICD-10-CM

## 2014-04-08 DIAGNOSIS — I10 Essential (primary) hypertension: Secondary | ICD-10-CM | POA: Diagnosis present

## 2014-04-08 DIAGNOSIS — K589 Irritable bowel syndrome without diarrhea: Secondary | ICD-10-CM | POA: Diagnosis present

## 2014-04-08 DIAGNOSIS — M5020 Other cervical disc displacement, unspecified cervical region: Principal | ICD-10-CM | POA: Diagnosis present

## 2014-04-08 DIAGNOSIS — Z981 Arthrodesis status: Secondary | ICD-10-CM

## 2014-04-08 DIAGNOSIS — F419 Anxiety disorder, unspecified: Secondary | ICD-10-CM | POA: Diagnosis present

## 2014-04-08 DIAGNOSIS — M5000 Cervical disc disorder with myelopathy, unspecified cervical region: Secondary | ICD-10-CM | POA: Diagnosis present

## 2014-04-08 DIAGNOSIS — J449 Chronic obstructive pulmonary disease, unspecified: Secondary | ICD-10-CM | POA: Diagnosis present

## 2014-04-08 DIAGNOSIS — R110 Nausea: Secondary | ICD-10-CM | POA: Diagnosis not present

## 2014-04-08 DIAGNOSIS — Z419 Encounter for procedure for purposes other than remedying health state, unspecified: Secondary | ICD-10-CM

## 2014-04-08 DIAGNOSIS — E669 Obesity, unspecified: Secondary | ICD-10-CM | POA: Diagnosis present

## 2014-04-08 DIAGNOSIS — E785 Hyperlipidemia, unspecified: Secondary | ICD-10-CM | POA: Diagnosis present

## 2014-04-08 DIAGNOSIS — Z6823 Body mass index (BMI) 23.0-23.9, adult: Secondary | ICD-10-CM

## 2014-04-08 DIAGNOSIS — E119 Type 2 diabetes mellitus without complications: Secondary | ICD-10-CM | POA: Diagnosis present

## 2014-04-08 DIAGNOSIS — I252 Old myocardial infarction: Secondary | ICD-10-CM

## 2014-04-08 DIAGNOSIS — D649 Anemia, unspecified: Secondary | ICD-10-CM | POA: Diagnosis present

## 2014-04-08 DIAGNOSIS — Z87891 Personal history of nicotine dependence: Secondary | ICD-10-CM

## 2014-04-08 HISTORY — PX: ANTERIOR CERVICAL DECOMP/DISCECTOMY FUSION: SHX1161

## 2014-04-08 LAB — GLUCOSE, CAPILLARY
Glucose-Capillary: 171 mg/dL — ABNORMAL HIGH (ref 70–99)
Glucose-Capillary: 109 mg/dL — ABNORMAL HIGH (ref 70–99)
Glucose-Capillary: 146 mg/dL — ABNORMAL HIGH (ref 70–99)
Glucose-Capillary: 171 mg/dL — ABNORMAL HIGH (ref 70–99)

## 2014-04-08 SURGERY — ANTERIOR CERVICAL DECOMPRESSION/DISCECTOMY FUSION 2 LEVELS
Anesthesia: General

## 2014-04-08 MED ORDER — SENNA 8.6 MG PO TABS
1.0000 | ORAL_TABLET | Freq: Two times a day (BID) | ORAL | Status: DC
  Administered 2014-04-08 – 2014-04-11 (×5): 8.6 mg via ORAL
  Filled 2014-04-08 (×6): qty 1

## 2014-04-08 MED ORDER — LORATADINE 10 MG PO TABS: 10.0000 mg | ORAL_TABLET | Freq: Every day | ORAL | Status: DC | PRN

## 2014-04-08 MED ORDER — SODIUM CHLORIDE 0.9 % IJ SOLN
3.0000 mL | Freq: Two times a day (BID) | INTRAMUSCULAR | Status: DC
  Administered 2014-04-08 – 2014-04-11 (×6): 3 mL via INTRAVENOUS

## 2014-04-08 MED ORDER — NEOSTIGMINE METHYLSULFATE 10 MG/10ML IV SOLN
INTRAVENOUS | Status: DC | PRN
  Administered 2014-04-08: 4 mg via INTRAVENOUS

## 2014-04-08 MED ORDER — PHENOL 1.4 % MT LIQD
1.0000 | OROMUCOSAL | Status: DC | PRN
  Administered 2014-04-08: 1 via OROMUCOSAL

## 2014-04-08 MED ORDER — HYDROMORPHONE HCL 1 MG/ML IJ SOLN
0.2500 mg | INTRAMUSCULAR | Status: DC | PRN
  Administered 2014-04-08 (×2): 0.5 mg via INTRAVENOUS

## 2014-04-08 MED ORDER — ONDANSETRON HCL 4 MG/2ML IJ SOLN: 4.0000 mg | Freq: Four times a day (QID) | INTRAMUSCULAR | Status: DC | PRN

## 2014-04-08 MED ORDER — MIDAZOLAM HCL 2 MG/2ML IJ SOLN
INTRAMUSCULAR | Status: AC
  Filled 2014-04-08: qty 2

## 2014-04-08 MED ORDER — ONDANSETRON HCL 4 MG/2ML IJ SOLN
4.0000 mg | INTRAMUSCULAR | Status: DC | PRN
  Administered 2014-04-09 – 2014-04-10 (×2): 4 mg via INTRAVENOUS
  Filled 2014-04-08: qty 2

## 2014-04-08 MED ORDER — ALBUTEROL SULFATE (2.5 MG/3ML) 0.083% IN NEBU: 2.5000 mg | INHALATION_SOLUTION | Freq: Four times a day (QID) | RESPIRATORY_TRACT | Status: DC | PRN

## 2014-04-08 MED ORDER — LACTATED RINGERS IV SOLN
INTRAVENOUS | Status: DC | PRN
  Administered 2014-04-08 (×2): via INTRAVENOUS

## 2014-04-08 MED ORDER — B COMPLEX-C PO TABS
1.0000 | ORAL_TABLET | Freq: Every day | ORAL | Status: DC
  Administered 2014-04-10 – 2014-04-11 (×2): 1 via ORAL
  Filled 2014-04-08 (×3): qty 1

## 2014-04-08 MED ORDER — LIDOCAINE HCL (CARDIAC) 20 MG/ML IV SOLN
INTRAVENOUS | Status: DC | PRN
  Administered 2014-04-08: 50 mg via INTRAVENOUS

## 2014-04-08 MED ORDER — HYDROMORPHONE HCL 1 MG/ML IJ SOLN
INTRAMUSCULAR | Status: AC
  Filled 2014-04-08: qty 1

## 2014-04-08 MED ORDER — OLMESARTAN-AMLODIPINE-HCTZ 40-5-12.5 MG PO TABS: 1.0000 | ORAL_TABLET | Freq: Every day | ORAL | Status: DC

## 2014-04-08 MED ORDER — EPHEDRINE SULFATE 50 MG/ML IJ SOLN
INTRAMUSCULAR | Status: AC
  Filled 2014-04-08: qty 1

## 2014-04-08 MED ORDER — SODIUM CHLORIDE 0.9 % IJ SOLN: 3.0000 mL | INTRAMUSCULAR | Status: DC | PRN

## 2014-04-08 MED ORDER — OXYCODONE HCL 5 MG/5ML PO SOLN: 5.0000 mg | Freq: Once | ORAL | Status: DC | PRN

## 2014-04-08 MED ORDER — FLUTICASONE PROPIONATE 50 MCG/ACT NA SUSP
1.0000 | Freq: Every day | NASAL | Status: DC
  Administered 2014-04-08 – 2014-04-11 (×2): 1 via NASAL
  Filled 2014-04-08 (×2): qty 16

## 2014-04-08 MED ORDER — B COMPLEX PO TABS: 1.0000 | ORAL_TABLET | Freq: Every day | ORAL | Status: DC

## 2014-04-08 MED ORDER — POTASSIUM CHLORIDE CRYS ER 20 MEQ PO TBCR
20.0000 meq | EXTENDED_RELEASE_TABLET | Freq: Every day | ORAL | Status: DC | PRN
  Filled 2014-04-08: qty 1

## 2014-04-08 MED ORDER — OXYCODONE HCL 5 MG PO TABS: 5.0000 mg | ORAL_TABLET | Freq: Once | ORAL | Status: DC | PRN

## 2014-04-08 MED ORDER — ZOLPIDEM TARTRATE 5 MG PO TABS: 5.0000 mg | ORAL_TABLET | Freq: Every evening | ORAL | Status: DC | PRN

## 2014-04-08 MED ORDER — OXYCODONE-ACETAMINOPHEN 5-325 MG PO TABS
1.0000 | ORAL_TABLET | ORAL | Status: DC | PRN
  Administered 2014-04-11 (×2): 2 via ORAL
  Filled 2014-04-08 (×2): qty 2

## 2014-04-08 MED ORDER — HYDROMORPHONE HCL 1 MG/ML IJ SOLN
0.5000 mg | INTRAMUSCULAR | Status: DC | PRN
  Administered 2014-04-09 – 2014-04-11 (×5): 1 mg via INTRAVENOUS
  Filled 2014-04-08 (×4): qty 1

## 2014-04-08 MED ORDER — ARTIFICIAL TEARS OP OINT
TOPICAL_OINTMENT | OPHTHALMIC | Status: DC | PRN
  Administered 2014-04-08: 1 via OPHTHALMIC

## 2014-04-08 MED ORDER — FENTANYL CITRATE 0.05 MG/ML IJ SOLN
INTRAMUSCULAR | Status: DC | PRN
  Administered 2014-04-08: 100 ug via INTRAVENOUS
  Administered 2014-04-08 (×3): 50 ug via INTRAVENOUS

## 2014-04-08 MED ORDER — FENTANYL CITRATE 0.05 MG/ML IJ SOLN
INTRAMUSCULAR | Status: AC
  Filled 2014-04-08: qty 5

## 2014-04-08 MED ORDER — NEOSTIGMINE METHYLSULFATE 10 MG/10ML IV SOLN
INTRAVENOUS | Status: AC
  Filled 2014-04-08: qty 1

## 2014-04-08 MED ORDER — INSULIN ASPART 100 UNIT/ML ~~LOC~~ SOLN
4.0000 [IU] | Freq: Three times a day (TID) | SUBCUTANEOUS | Status: DC
  Administered 2014-04-09 – 2014-04-11 (×8): 4 [IU] via SUBCUTANEOUS

## 2014-04-08 MED ORDER — PROPOFOL 10 MG/ML IV BOLUS
INTRAVENOUS | Status: DC | PRN
  Administered 2014-04-08: 150 mg via INTRAVENOUS

## 2014-04-08 MED ORDER — LACTATED RINGERS IV SOLN
INTRAVENOUS | Status: DC
  Administered 2014-04-08: 13:00:00 via INTRAVENOUS

## 2014-04-08 MED ORDER — HYDROCODONE-ACETAMINOPHEN 5-325 MG PO TABS
1.0000 | ORAL_TABLET | ORAL | Status: DC | PRN
  Administered 2014-04-09 (×3): 1 via ORAL
  Administered 2014-04-10 (×3): 2 via ORAL
  Filled 2014-04-08: qty 1
  Filled 2014-04-08 (×3): qty 2

## 2014-04-08 MED ORDER — ACETAMINOPHEN 650 MG RE SUPP: 650.0000 mg | RECTAL | Status: DC | PRN

## 2014-04-08 MED ORDER — POTASSIUM CHLORIDE IN NACL 20-0.9 MEQ/L-% IV SOLN
INTRAVENOUS | Status: DC
  Filled 2014-04-08 (×3): qty 1000

## 2014-04-08 MED ORDER — ROSUVASTATIN CALCIUM 20 MG PO TABS
20.0000 mg | ORAL_TABLET | Freq: Every day | ORAL | Status: DC
  Administered 2014-04-08 – 2014-04-10 (×3): 20 mg via ORAL
  Filled 2014-04-08 (×4): qty 1

## 2014-04-08 MED ORDER — NITROGLYCERIN 0.4 MG SL SUBL: 0.4000 mg | SUBLINGUAL_TABLET | SUBLINGUAL | Status: DC | PRN

## 2014-04-08 MED ORDER — ONDANSETRON HCL 4 MG/2ML IJ SOLN
INTRAMUSCULAR | Status: DC | PRN
  Administered 2014-04-08: 4 mg via INTRAVENOUS

## 2014-04-08 MED ORDER — GLYCOPYRROLATE 0.2 MG/ML IJ SOLN
INTRAMUSCULAR | Status: AC
  Filled 2014-04-08: qty 7

## 2014-04-08 MED ORDER — SUMATRIPTAN SUCCINATE 50 MG PO TABS: 50.0000 mg | ORAL_TABLET | ORAL | Status: DC | PRN

## 2014-04-08 MED ORDER — ROCURONIUM BROMIDE 100 MG/10ML IV SOLN
INTRAVENOUS | Status: DC | PRN
Start: 2014-04-08 — End: 2014-04-08
  Administered 2014-04-08: 50 mg via INTRAVENOUS

## 2014-04-08 MED ORDER — VECURONIUM BROMIDE 10 MG IV SOLR
INTRAVENOUS | Status: AC
  Filled 2014-04-08: qty 10

## 2014-04-08 MED ORDER — IRBESARTAN 300 MG PO TABS
300.0000 mg | ORAL_TABLET | Freq: Every day | ORAL | Status: DC
  Administered 2014-04-08 – 2014-04-11 (×3): 300 mg via ORAL
  Filled 2014-04-08 (×2): qty 1
  Filled 2014-04-08 (×2): qty 2

## 2014-04-08 MED ORDER — ACETAMINOPHEN 325 MG PO TABS: 650.0000 mg | ORAL_TABLET | ORAL | Status: DC | PRN

## 2014-04-08 MED ORDER — SODIUM CHLORIDE 0.9 % IJ SOLN
INTRAMUSCULAR | Status: AC
  Filled 2014-04-08: qty 10

## 2014-04-08 MED ORDER — POLYETHYLENE GLYCOL 3350 17 G PO PACK
17.0000 g | PACK | Freq: Every day | ORAL | Status: DC | PRN
  Filled 2014-04-08: qty 1

## 2014-04-08 MED ORDER — INSULIN ASPART 100 UNIT/ML ~~LOC~~ SOLN
0.0000 [IU] | Freq: Every day | SUBCUTANEOUS | Status: DC
  Administered 2014-04-10: 2 [IU] via SUBCUTANEOUS

## 2014-04-08 MED ORDER — DIAZEPAM 5 MG PO TABS
5.0000 mg | ORAL_TABLET | Freq: Four times a day (QID) | ORAL | Status: DC | PRN
  Administered 2014-04-09 – 2014-04-11 (×2): 5 mg via ORAL
  Filled 2014-04-08 (×2): qty 1

## 2014-04-08 MED ORDER — CALCIUM CARBONATE-VITAMIN D 500-200 MG-UNIT PO TABS
1.0000 | ORAL_TABLET | Freq: Every morning | ORAL | Status: DC
  Administered 2014-04-10 – 2014-04-11 (×2): 1 via ORAL
  Filled 2014-04-08 (×3): qty 1

## 2014-04-08 MED ORDER — STERILE WATER FOR INJECTION IJ SOLN
INTRAMUSCULAR | Status: AC
  Filled 2014-04-08: qty 10

## 2014-04-08 MED ORDER — HYDROCHLOROTHIAZIDE 12.5 MG PO CAPS
12.5000 mg | ORAL_CAPSULE | Freq: Every day | ORAL | Status: DC
  Administered 2014-04-10 – 2014-04-11 (×2): 12.5 mg via ORAL
  Filled 2014-04-08 (×4): qty 1

## 2014-04-08 MED ORDER — MUPIROCIN CALCIUM 2 % EX CREA
1.0000 "application " | TOPICAL_CREAM | Freq: Two times a day (BID) | CUTANEOUS | Status: DC
  Administered 2014-04-08 – 2014-04-11 (×5): 1 via TOPICAL
  Filled 2014-04-08: qty 15

## 2014-04-08 MED ORDER — MIDAZOLAM HCL 5 MG/5ML IJ SOLN
INTRAMUSCULAR | Status: DC | PRN
  Administered 2014-04-08: 2 mg via INTRAVENOUS

## 2014-04-08 MED ORDER — GLYCOPYRROLATE 0.2 MG/ML IJ SOLN
INTRAMUSCULAR | Status: DC | PRN
  Administered 2014-04-08: .8 mg via INTRAVENOUS

## 2014-04-08 MED ORDER — PHENYLEPHRINE HCL 10 MG/ML IJ SOLN
INTRAMUSCULAR | Status: AC
  Filled 2014-04-08: qty 1

## 2014-04-08 MED ORDER — MENTHOL 3 MG MT LOZG: 1.0000 | LOZENGE | OROMUCOSAL | Status: DC | PRN

## 2014-04-08 MED ORDER — PANTOPRAZOLE SODIUM 40 MG PO TBEC
40.0000 mg | DELAYED_RELEASE_TABLET | Freq: Every day | ORAL | Status: DC
  Administered 2014-04-10 – 2014-04-11 (×2): 40 mg via ORAL
  Filled 2014-04-08 (×2): qty 1

## 2014-04-08 MED ORDER — NITROGLYCERIN 0.4 MG/HR TD PT24: 0.4000 mg | MEDICATED_PATCH | TRANSDERMAL | Status: DC | PRN

## 2014-04-08 MED ORDER — AMLODIPINE BESYLATE 5 MG PO TABS
5.0000 mg | ORAL_TABLET | Freq: Every day | ORAL | Status: DC
  Administered 2014-04-08 – 2014-04-11 (×3): 5 mg via ORAL
  Filled 2014-04-08 (×4): qty 1

## 2014-04-08 MED ORDER — ALPRAZOLAM 0.25 MG PO TABS: 0.2500 mg | ORAL_TABLET | Freq: Two times a day (BID) | ORAL | Status: DC | PRN

## 2014-04-08 SURGICAL SUPPLY — 70 items
BIT DRILL NEURO 2X3.1 SFT TUCH (MISCELLANEOUS) ×1 IMPLANT
BNDG GAUZE ELAST 4 BULKY (GAUZE/BANDAGES/DRESSINGS) ×4 IMPLANT
BUR DRUM 4.0 (BURR) ×2 IMPLANT
CANISTER SUCT 3000ML (MISCELLANEOUS) ×2 IMPLANT
CONT SPEC 4OZ CLIKSEAL STRL BL (MISCELLANEOUS) ×2 IMPLANT
DECANTER SPIKE VIAL GLASS SM (MISCELLANEOUS) ×2 IMPLANT
DRAPE LAPAROTOMY 100X72 PEDS (DRAPES) ×2 IMPLANT
DRAPE MICROSCOPE LEICA (MISCELLANEOUS) ×2 IMPLANT
DRAPE POUCH INSTRU U-SHP 10X18 (DRAPES) ×2 IMPLANT
DRAPE PROXIMA HALF (DRAPES) IMPLANT
DRILL NEURO 2X3.1 SOFT TOUCH (MISCELLANEOUS) ×2
DURAPREP 6ML APPLICATOR 50/CS (WOUND CARE) ×2 IMPLANT
ELECT COATED BLADE 2.86 ST (ELECTRODE) ×2 IMPLANT
ELECT REM PT RETURN 9FT ADLT (ELECTROSURGICAL) ×2
ELECTRODE REM PT RTRN 9FT ADLT (ELECTROSURGICAL) ×1 IMPLANT
GAUZE SPONGE 4X4 16PLY XRAY LF (GAUZE/BANDAGES/DRESSINGS) IMPLANT
GLOVE BIO SURGEON STRL SZ 6.5 (GLOVE) IMPLANT
GLOVE BIO SURGEON STRL SZ7 (GLOVE) IMPLANT
GLOVE BIO SURGEON STRL SZ7.5 (GLOVE) IMPLANT
GLOVE BIO SURGEON STRL SZ8 (GLOVE) ×2 IMPLANT
GLOVE BIO SURGEON STRL SZ8.5 (GLOVE) IMPLANT
GLOVE BIOGEL M 8.0 STRL (GLOVE) IMPLANT
GLOVE BIOGEL PI IND STRL 7.5 (GLOVE) ×1 IMPLANT
GLOVE BIOGEL PI IND STRL 8.5 (GLOVE) ×1 IMPLANT
GLOVE BIOGEL PI INDICATOR 7.5 (GLOVE) ×1
GLOVE BIOGEL PI INDICATOR 8.5 (GLOVE) ×1
GLOVE ECLIPSE 6.5 STRL STRAW (GLOVE) ×2 IMPLANT
GLOVE ECLIPSE 7.0 STRL STRAW (GLOVE) IMPLANT
GLOVE ECLIPSE 7.5 STRL STRAW (GLOVE) IMPLANT
GLOVE ECLIPSE 8.0 STRL XLNG CF (GLOVE) IMPLANT
GLOVE ECLIPSE 8.5 STRL (GLOVE) IMPLANT
GLOVE EXAM NITRILE LRG STRL (GLOVE) IMPLANT
GLOVE EXAM NITRILE MD LF STRL (GLOVE) IMPLANT
GLOVE EXAM NITRILE XL STR (GLOVE) IMPLANT
GLOVE EXAM NITRILE XS STR PU (GLOVE) IMPLANT
GLOVE INDICATOR 6.5 STRL GRN (GLOVE) IMPLANT
GLOVE INDICATOR 7.0 STRL GRN (GLOVE) IMPLANT
GLOVE INDICATOR 7.5 STRL GRN (GLOVE) IMPLANT
GLOVE INDICATOR 8.0 STRL GRN (GLOVE) IMPLANT
GLOVE INDICATOR 8.5 STRL (GLOVE) IMPLANT
GLOVE OPTIFIT SS 8.0 STRL (GLOVE) IMPLANT
GLOVE SURG SS PI 6.5 STRL IVOR (GLOVE) IMPLANT
GLOVE SURG SS PI 7.0 STRL IVOR (GLOVE) ×4 IMPLANT
GOWN STRL REUS W/ TWL LRG LVL3 (GOWN DISPOSABLE) ×3 IMPLANT
GOWN STRL REUS W/ TWL XL LVL3 (GOWN DISPOSABLE) IMPLANT
GOWN STRL REUS W/TWL 2XL LVL3 (GOWN DISPOSABLE) IMPLANT
GOWN STRL REUS W/TWL LRG LVL3 (GOWN DISPOSABLE) ×3
GOWN STRL REUS W/TWL XL LVL3 (GOWN DISPOSABLE)
IMPL ZERO-P LORDOTIC 10MM (Neuro Prosthesis/Implant) ×1 IMPLANT
IMPLANT ZERO-P LORDOTIC 10MM (Neuro Prosthesis/Implant) ×2 IMPLANT
KIT BASIN OR (CUSTOM PROCEDURE TRAY) ×2 IMPLANT
KIT ROOM TURNOVER OR (KITS) ×2 IMPLANT
LIQUID BAND (GAUZE/BANDAGES/DRESSINGS) ×2 IMPLANT
NEEDLE HYPO 25X1 1.5 SAFETY (NEEDLE) ×2 IMPLANT
NEEDLE SPNL 22GX3.5 QUINCKE BK (NEEDLE) ×2 IMPLANT
NS IRRIG 1000ML POUR BTL (IV SOLUTION) ×2 IMPLANT
PACK LAMINECTOMY NEURO (CUSTOM PROCEDURE TRAY) ×2 IMPLANT
PAD ARMBOARD 7.5X6 YLW CONV (MISCELLANEOUS) ×6 IMPLANT
PIN DISTRACTION 14MM (PIN) ×4 IMPLANT
PUTTY BONE DBX 2.5 MIS (Bone Implant) ×2 IMPLANT
RUBBERBAND STERILE (MISCELLANEOUS) ×4 IMPLANT
SPONGE INTESTINAL PEANUT (DISPOSABLE) ×2 IMPLANT
SPONGE SURGIFOAM ABS GEL SZ50 (HEMOSTASIS) ×2 IMPLANT
SUT VIC AB 0 CT1 27 (SUTURE) ×1
SUT VIC AB 0 CT1 27XBRD ANTBC (SUTURE) ×1 IMPLANT
SUT VIC AB 3-0 SH 8-18 (SUTURE) ×2 IMPLANT
SYR 20ML ECCENTRIC (SYRINGE) ×2 IMPLANT
TOWEL OR 17X24 6PK STRL BLUE (TOWEL DISPOSABLE) ×2 IMPLANT
TOWEL OR 17X26 10 PK STRL BLUE (TOWEL DISPOSABLE) ×2 IMPLANT
WATER STERILE IRR 1000ML POUR (IV SOLUTION) ×2 IMPLANT

## 2014-04-08 NOTE — Plan of Care (Signed)
Problem: Consults Goal: Spinal Surgery Patient Education See Patient Education Module for education specifics.  Outcome: Completed/Met Date Met:  04/08/14

## 2014-04-08 NOTE — Plan of Care (Signed)
Problem: Phase I Progression Outcomes Goal: Pain controlled with appropriate interventions Outcome: Progressing Goal: OOB as tolerated unless otherwise ordered Outcome: Progressing Goal: Log roll for position change Outcome: Progressing Goal: Initial discharge plan identified Outcome: Progressing Goal: Hemodynamically stable Outcome: Progressing

## 2014-04-08 NOTE — Progress Notes (Signed)
Assessment done at 1435 not 1503

## 2014-04-08 NOTE — Plan of Care (Signed)
Problem: Consults Goal: Diagnosis - Spinal Surgery Outcome: Completed/Met Date Met:  04/08/14 Cervical Spine Fusion     

## 2014-04-08 NOTE — Anesthesia Procedure Notes (Signed)
Procedure Name: Intubation Date/Time: 04/08/2014 2:46 PM Performed by: Neldon Newport Pre-anesthesia Checklist: Patient identified, Timeout performed, Emergency Drugs available, Suction available and Patient being monitored Patient Re-evaluated:Patient Re-evaluated prior to inductionOxygen Delivery Method: Circle system utilized Preoxygenation: Pre-oxygenation with 100% oxygen Intubation Type: IV induction Ventilation: Mask ventilation without difficulty and Oral airway inserted - appropriate to patient size Laryngoscope Size: Mac and 3 Grade View: Grade I Tube type: Oral Tube size: 7.5 mm Number of attempts: 1 Placement Confirmation: positive ETCO2,  ETT inserted through vocal cords under direct vision and breath sounds checked- equal and bilateral Secured at: 21 cm Tube secured with: Tape Dental Injury: Teeth and Oropharynx as per pre-operative assessment

## 2014-04-08 NOTE — Transfer of Care (Signed)
Immediate Anesthesia Transfer of Care Note  Patient: [name]  Procedure(s) Performed: Procedure(s) with comments: Cervical Six-Seven ANTERIOR CERVICAL DECOMPRESSION/DISCECTOMY FUSION Plating and Bonegraft  2 LEVELS (N/A) - Cervical Six-Seven ANTERIOR CERVICAL DECOMPRESSION/DISCECTOMY FUSION Plating and Bonegraft  2 LEVELS  Patient Location: PACU  Anesthesia Type:General  Level of Consciousness: awake, alert  and oriented  Airway & Oxygen Therapy: Patient Spontanous Breathing and Patient connected to nasal cannula oxygen  Post-op Assessment: Report given to PACU RN and Post -op Vital signs reviewed and stable  Post vital signs: Reviewed and stable  Complications: No apparent anesthesia complications

## 2014-04-08 NOTE — Op Note (Signed)
04/08/2014  5:30 PM  PATIENT:  [name]  58 y.o. 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 was taken to the operating room, intubated, and placed under general anesthesia without difficulty. She was positioned supine with her head in slight extension on a horseshoe headrest. The neck was prepped and draped in a sterile manner. I infiltrated 3 cc's 1/2%lidocaine/1:200,000 strength epinephrine into the planned incision starting from the midline to the medial border of the left sternocleidomastoid muscle. I opened the incision with a 10 blade and dissected sharply through soft tissue to the platysma. I dissected in the plane superior to the platysma both rostrally and caudally. I then opened the platysma in a horizontal fashion with Metzenbaum scissors, and dissected in the inferior plane rostrally and caudally. With both blunt and sharp technique I created an avascular corridor to the cervical spine. I identified the previous plate and moved caudally to the 6/7 space. I then reflected the longus colli from C6 to C7 and placed self retaining retractors. I opened the disc space(s) at 6/7 with a 15 blade. I removed disc with curettes, Kerrison punches, and the drill. Using the drill I removed osteophytes and prepared for the decompression.  I decompressed the spinal canal and the C7 root(s) with the drill, Kerrison punches, and the curettes. I used the microscope to aid in microdissection. I removed the posterior  longitudinal ligament to fully expose and decompress the thecal sac. I exposed the roots laterally taking down the 6/7 uncovertebral joints. With the decompression complete we moved on to the arthrodesis. I used the drill to level the surfaces of C6, and 7. I removed soft tissue to prepare the disc space and the bony surfaces. I measured the space and placed a 69mm Peek Op (Synthes) into the disc space.  we then placed the anterior instrumentation. I placed 2 screws one  in each vertebral body through the plate. I locked the screws into place.  I irrigated the wound, achieved hemostasis, and closed the wound in layers. I approximated the platysma, and the subcuticular plane with vicryl sutures. I used Dermabond for a sterile dressing.   PLAN OF CARE: Admit for overnight observation  PATIENT DISPOSITION:  PACU - hemodynamically stable.   Delay start of Pharmacological VTE agent (>24hrs) due to surgical blood loss or risk of bleeding:  yes

## 2014-04-08 NOTE — H&P (Signed)
BP 166/80 mmHg  Pulse 59  Temp(Src) 97.3 F (36.3 C) (Oral)  Resp 20  Wt 63.504 kg (140 lb)  SpO2 94%     HISTORY OF PRESENT ILLNESS:                     [name] is well known to me having had both the cervical operation at 5-6 in 2002 and then lumbar procedure in 2008.  [name] presents today for evaluation of pain which she has in the right upper extremity.  She said this started 01/05/2014 with shooting pain.  This soon can be associated with numbness in the right hand and she states all the pain is at the right hand.  In mid September, she went to the emergency room and so she had a musculoskeletal problem.  She went back approximately a week later and so she had degenerative disc disease.  She then went to see Dr. Jeanann Lewandowsky, as he is her primary care physician and endocrinologist.  He ordered shoulder x-rays, thoracic x-rays, and cervical spine films.  The shoulder x-ray on the right side was normal.  The cervical spine film solid fusion at 5-6 and some spondylitic change present elsewhere and some present at 6-7.  Alignment was otherwise okay.  The thoracic film was unremarkable except for some degeneration present.  She has no symptoms on the left side.  She has had no bowel or bladder problems.  She was given the shot of steroids and has been taking anti-inflammatories since September 2.  She states quite clearly she has not had a good night of sleep since September 2.  She is sleeping with a heating pad since the every night just trying to get some sleep.     [name] is 58 years of age and right-handed and is on disability.     PAST MEDICAL HISTORY:              Current Medical Conditions:  Includes hypertension, myocardial infarction, cerebrovascular attack, and diabetes.  Includes hypertension, anxiety, hyperlipidemia, gastroesophageal reflux disease, irritable bowel syndrome, sinus problem, hidradenitis, anemia, Schatzki ring, diverticulosis of colon, she has  had a GI bleed in April 2013, peptic ulcer disease, chronic asthmatic bronchitis, obesity, left-sided weakness, cancer, anginal pain, pneumonia, bronchitis, type 2 diabetes, hiatal hernia, chronic headaches, migraines, arthritis, chest pain, myocardial infarction, she has had blood transfusion, she has had some problems with anesthesia, and vitamin B12 deficiency.              Prior Operations:  She has an extensive surgical history outside of the spine operations, excision of hidradenitis and the axilla hidradenitis, excision again on two occasions.  She has undergone a colonoscopy, esophagogastroduodenoscopy, Nissen fundoplication, uvulopalatopharyngoplasty, septoplasty, hammertoe surgery, cardiac catheterization, hysterectomy, cholecystectomy, appendectomy, breast cyst incision, mammoplasty reduction, breast biopsy, posterior lumbar fusion, tonsillectomy, and adenoidectomy, hidradenitis, a mass excision, and hiatal hernia repair.              Medications and Allergies:  Medications are as follows, ibuprofen, hydrocodone, vitamin B12, Calcitrate, vitamin D, doxycycline, insulin, mupirocin cream, sumatriptan, potassium chloride, loratadine, Proventil, Plavix, alprazolam, olmesartan, amlodipine hydrochlorothiazide, simvastatin, nitroglycerin, Zolpidem, and mometasone.     FAMILY HISTORY:                                            Includes heart disease,  myocardial infarction.  [name] when we were speaking just mentioned that she lost her sister recently.  Breast cancer, hypertension, and diabetes also present in the family history.  She also has a sister with an aneurysm, emphysema, hyperlipidemia, and father did have cerebrovascular attack also.  Mother and father both deceased.  She has two sisters, whom are still living.     SOCIAL HISTORY:                                            She used to smoke, year smoked 41.  She stopped smoking March 2015.  She does not use alcohol.  No illicit  drugs.     PHYSICAL EXAMINATION:                                On physical exam, she is weak in the triceps approximately 4/5, otherwise has very good strength in the upper extremities.  Left side is normal.  Normal strength in the lower extremities.  Reflexes are intact at the knees, 1+ at the ankles, 2+ in the upper extremities.  Pupils are equal, round and reactive to light.  She has full extraocular movements.  Full visual fields.  Symmetric facial sensation and movement.  Uvula elevates in midline.  Shoulder shrug is normal.  Tongue protrudes in the midline.  Romberg test is negative.  Gait is otherwise normal.     DIAGNOSTIC STUDIES:                                    X-rays are reviewed and they show a solid fusion at 5-6.  She has normal alignment in the cervical spine and again osteophytic change.  She has some mild scoliosis in the thoracic spine with the curve to the left.          [name] returns today.  What the MRI shows is that she has large disc herniation at the C7 level.  It is mainly on the left side, but does expand enough on the right that her right-sided pain is explained.  She has solid fusion at C5-6.  I believe this is a CSLP Synthes plate. [name] needs to have anterior cervical decompression and arthrodesis at C6-7.  The first date that I had available was 04/08/2014.  I am going to try to get her in early knowing that she is in a great deal of pain and seeing the size of the disc herniation.

## 2014-04-08 NOTE — Anesthesia Preprocedure Evaluation (Addendum)
Anesthesia Evaluation  Patient identified by MRN, date of birth, ID band Patient awake    Reviewed: Allergy & Precautions, H&P , NPO status , Patient's Chart, lab work & pertinent test results  Airway Mallampati: II   Neck ROM: full    Dental  (+) Dental Advidsory Given, Poor Dentition   Pulmonary asthma , pneumonia -, COPDCurrent Smoker,          Cardiovascular hypertension, + angina + Past MI  Mild CAD on cath   Neuro/Psych  Headaches, CVA    GI/Hepatic hiatal hernia, PUD, GERD-  ,  Endo/Other  diabetes, Type 2  Renal/GU      Musculoskeletal   Abdominal   Peds  Hematology   Anesthesia Other Findings   Reproductive/Obstetrics                            Anesthesia Physical Anesthesia Plan  ASA: III  Anesthesia Plan: General   Post-op Pain Management:    Induction: Intravenous  Airway Management Planned: Oral ETT  Additional Equipment:   Intra-op Plan:   Post-operative Plan: Extubation in OR  Informed Consent: I have reviewed the patients History and Physical, chart, labs and discussed the procedure including the risks, benefits and alternatives for the proposed anesthesia with the patient or authorized representative who has indicated his/her understanding and acceptance.   Dental Advisory Given  Plan Discussed with: CRNA, Anesthesiologist and Surgeon  Anesthesia Plan Comments:        Anesthesia Quick Evaluation

## 2014-04-09 LAB — HEMOGLOBIN A1C
Hgb A1c MFr Bld: 8.1 % — ABNORMAL HIGH (ref <5.7)
Mean Plasma Glucose: 186 mg/dL — ABNORMAL HIGH (ref <117)

## 2014-04-09 LAB — GLUCOSE, CAPILLARY
Glucose-Capillary: 134 mg/dL — ABNORMAL HIGH (ref 70–99)
Glucose-Capillary: 145 mg/dL — ABNORMAL HIGH (ref 70–99)
Glucose-Capillary: 159 mg/dL — ABNORMAL HIGH (ref 70–99)
Glucose-Capillary: 104 mg/dL — ABNORMAL HIGH (ref 70–99)

## 2014-04-09 MED ORDER — HYDROMORPHONE HCL 1 MG/ML IJ SOLN
INTRAMUSCULAR | Status: AC
  Filled 2014-04-09: qty 1

## 2014-04-10 LAB — GLUCOSE, CAPILLARY
Glucose-Capillary: 154 mg/dL — ABNORMAL HIGH (ref 70–99)
Glucose-Capillary: 161 mg/dL — ABNORMAL HIGH (ref 70–99)
Glucose-Capillary: 130 mg/dL — ABNORMAL HIGH (ref 70–99)
Glucose-Capillary: 212 mg/dL — ABNORMAL HIGH (ref 70–99)

## 2014-04-10 NOTE — Progress Notes (Signed)
Subjective: Patient reports she still feels nauseated and does not yet feel ready to go home  Objective: Vital signs in last 24 hours: Temp:  [97.8 F (36.6 C)-99.9 F (37.7 C)] 99 F (37.2 C) (12/06 1043) Pulse Rate:  [50-115] 70 (12/06 1043) Resp:  [16-18] 18 (12/06 1043) BP: (128-155)/(53-68) 128/63 mmHg (12/06 1043) SpO2:  [94 %-98 %] 98 % (12/06 1043)  Intake/Output from previous day: 12/05 0701 - 12/06 0700 In: 360 [P.O.:360] Out: -  Intake/Output this shift:    Physical Exam: Strength is full in both upper extremities and her incision is healing well.  Lab Results: No results for input(s): WBC, HGB, HCT, PLT in the last 72 hours. BMET No results for input(s): NA, K, CL, CO2, GLUCOSE, BUN, CREATININE, CALCIUM in the last 72 hours.  Studies/Results: Dg Cervical Spine 1 View  04/08/2014   CLINICAL DATA:  C6-C7 ACDF  EXAM: DG CERVICAL SPINE - 1 VIEW  COMPARISON:  [name] cervical spine MRI 03/11/2014  FINDINGS: A single intraoperative fluoroscopic image demonstrates anterior fusion hardware spanning C5-C6 and C6-C7. C6-C7 interspace is obscured by soft tissue artifact.  IMPRESSION: Intraoperative imaging as above.   Electronically Signed   By: Conchita Paris M.D.   On: 04/08/2014 19:19    Assessment/Plan: Continue to mobilize and support.  Potentially, the patient will consider D/C in AM.    LOS: 2 days    Peggyann Shoals, MD 04/10/2014, 12:54 PM

## 2014-04-10 NOTE — Progress Notes (Signed)
  I dictated a note on transfer of patient on 04/09/14, but this is no longer present in patient record and I have therefore de-dictated the note.  Subjective: Patient reports nauseated  Objective: Vital signs in last 24 hours: Temp:  [97.8 F (36.6 C)-99.9 F (37.7 C)] 99 F (37.2 C) (12/06 1043) Pulse Rate:  [50-115] 70 (12/06 1043) Resp:  [16-18] 18 (12/06 1043) BP: (128-155)/(53-68) 128/63 mmHg (12/06 1043) SpO2:  [94 %-98 %] 98 % (12/06 1043)  Intake/Output from previous day: 12/05 0701 - 12/06 0700 In: 360 [P.O.:360] Out: -  Intake/Output this shift:    Physical Exam: Full strength.  Dressing CDI.  Lab Results: No results for input(s): WBC, HGB, HCT, PLT in the last 72 hours. BMET No results for input(s): NA, K, CL, CO2, GLUCOSE, BUN, CREATININE, CALCIUM in the last 72 hours.  Studies/Results: Dg Cervical Spine 1 View  04/08/2014   CLINICAL DATA:  C6-C7 ACDF  EXAM: DG CERVICAL SPINE - 1 VIEW  COMPARISON:  [name] cervical spine MRI 03/11/2014  FINDINGS: A single intraoperative fluoroscopic image demonstrates anterior fusion hardware spanning C5-C6 and C6-C7. C6-C7 interspace is obscured by soft tissue artifact.  IMPRESSION: Intraoperative imaging as above.   Electronically Signed   By: Conchita Paris M.D.   On: 04/08/2014 19:19    Assessment/Plan: Patient feeling nauseated.  Plan is to transfer to [facility] for continued care.    LOS: 2 days    Peggyann Shoals, MD 04/10/2014, 12:49 PM

## 2014-04-11 ENCOUNTER — Encounter (HOSPITAL_COMMUNITY): Payer: Self-pay | Admitting: Neurosurgery

## 2014-04-11 ENCOUNTER — Ambulatory Visit: Payer: Medicare Other | Admitting: Neurology

## 2014-04-11 DIAGNOSIS — R110 Nausea: Secondary | ICD-10-CM | POA: Diagnosis not present

## 2014-04-11 DIAGNOSIS — L732 Hidradenitis suppurativa: Secondary | ICD-10-CM | POA: Diagnosis present

## 2014-04-11 DIAGNOSIS — E119 Type 2 diabetes mellitus without complications: Secondary | ICD-10-CM | POA: Diagnosis present

## 2014-04-11 DIAGNOSIS — M5020 Other cervical disc displacement, unspecified cervical region: Secondary | ICD-10-CM | POA: Diagnosis present

## 2014-04-11 DIAGNOSIS — F419 Anxiety disorder, unspecified: Secondary | ICD-10-CM | POA: Diagnosis present

## 2014-04-11 DIAGNOSIS — E785 Hyperlipidemia, unspecified: Secondary | ICD-10-CM | POA: Diagnosis present

## 2014-04-11 DIAGNOSIS — D649 Anemia, unspecified: Secondary | ICD-10-CM | POA: Diagnosis present

## 2014-04-11 DIAGNOSIS — Z981 Arthrodesis status: Secondary | ICD-10-CM | POA: Diagnosis not present

## 2014-04-11 DIAGNOSIS — K589 Irritable bowel syndrome without diarrhea: Secondary | ICD-10-CM | POA: Diagnosis present

## 2014-04-11 DIAGNOSIS — Z8711 Personal history of peptic ulcer disease: Secondary | ICD-10-CM | POA: Diagnosis not present

## 2014-04-11 DIAGNOSIS — I252 Old myocardial infarction: Secondary | ICD-10-CM | POA: Diagnosis not present

## 2014-04-11 DIAGNOSIS — Z6823 Body mass index (BMI) 23.0-23.9, adult: Secondary | ICD-10-CM | POA: Diagnosis not present

## 2014-04-11 DIAGNOSIS — M5000 Cervical disc disorder with myelopathy, unspecified cervical region: Secondary | ICD-10-CM | POA: Diagnosis present

## 2014-04-11 DIAGNOSIS — I10 Essential (primary) hypertension: Secondary | ICD-10-CM | POA: Diagnosis present

## 2014-04-11 DIAGNOSIS — Z87891 Personal history of nicotine dependence: Secondary | ICD-10-CM | POA: Diagnosis not present

## 2014-04-11 DIAGNOSIS — J449 Chronic obstructive pulmonary disease, unspecified: Secondary | ICD-10-CM | POA: Diagnosis present

## 2014-04-11 DIAGNOSIS — K219 Gastro-esophageal reflux disease without esophagitis: Secondary | ICD-10-CM | POA: Diagnosis present

## 2014-04-11 DIAGNOSIS — E669 Obesity, unspecified: Secondary | ICD-10-CM | POA: Diagnosis present

## 2014-04-11 LAB — GLUCOSE, CAPILLARY
Glucose-Capillary: 193 mg/dL — ABNORMAL HIGH (ref 70–99)
Glucose-Capillary: 227 mg/dL — ABNORMAL HIGH (ref 70–99)

## 2014-04-11 MED ORDER — DEXAMETHASONE 4 MG PO TABS
4.0000 mg | ORAL_TABLET | Freq: Once | ORAL | Status: AC
  Administered 2014-04-11: 4 mg via ORAL
  Filled 2014-04-11: qty 1

## 2014-04-11 MED ORDER — TIZANIDINE HCL 4 MG PO TABS: 4.0000 mg | ORAL_TABLET | Freq: Four times a day (QID) | ORAL | Status: DC | PRN

## 2014-04-11 MED ORDER — OXYCODONE-ACETAMINOPHEN 5-325 MG PO TABS: 1.0000 | ORAL_TABLET | Freq: Four times a day (QID) | ORAL | Status: DC | PRN

## 2014-04-11 NOTE — Progress Notes (Signed)
Utilization review completed.  

## 2014-04-11 NOTE — Progress Notes (Signed)
Pt transported out of unit per wheelchair by guest services personnel. No acute distress noted. Family member at side with belongings.   [name] I 04/11/2014 1:46 PM

## 2014-04-11 NOTE — Progress Notes (Signed)
Discharge instructions and prescriptions given to pt. Pt verbally acknowledged understanding. No questions when prompted. Pt to be transported to home by family member by private car. Family member not present at bedside but pt has informed of dc.  Pt to be transported to lobby per wheelchair. Will monitor   Angeline Slim I 04/11/2014 11:37 AM

## 2014-04-11 NOTE — Discharge Summary (Signed)
Physician Discharge Summary  Patient ID: [name] MRN: [id number] DOB/AGE: May 05, 1956 58 y.o.  Admit date: 04/08/2014 Discharge date: 04/11/2014  Admission Diagnoses:HNP Cervical spine  Discharge Diagnoses:  Active Problems:   HNP (herniated nucleus pulposus), cervical   HNP (herniated nucleus pulposus) with myelopathy, cervical   Discharged Condition: good  Hospital Course: [name] was taken to the operating room and underwent an uncomplicated acdf at I9/5 using a zero p plate and peek implant. She had significant pain in the left upper extremity. That pain is significantly improved, she is complaining of tingling in the right upper extremity. She is voiding, ambulating and tolerating a regular diet. Her nausea has resolved at discharge.   Treatments: surgery: Anterior Cervical decompression C6/7 Arthrodesis C6 with 72mm Op 64mm peek graft  Discharge Exam: Blood pressure 131/75, pulse 115, temperature 98.3 F (36.8 C), temperature source Oral, resp. rate 18, height 5\' 4"  (1.626 m), weight 63.4 kg (139 lb 12.4 oz), SpO2 93 %. General appearance: alert, cooperative and appears stated age Neurologic: Alert and oriented X 3, normal strength and tone. Normal symmetric reflexes. Normal coordination and gait  Disposition: 01-Home or Self Care Cervical herniated disc    Medication List    TAKE these medications        ALPRAZolam 0.5 MG tablet  Commonly known as:  XANAX  Take 0.25-0.5 mg by mouth 2 (two) times daily as needed for anxiety.     b complex vitamins tablet  Take 1 tablet by mouth daily.     CALCIUM 600 + D PO  Take 1 tablet by mouth daily.     celecoxib 200 MG capsule  Commonly known as:  CELEBREX  Take 200 mg by mouth daily.     clopidogrel 75 MG tablet  Commonly known as:  PLAVIX  Take 75 mg by mouth at bedtime.     diazepam 5 MG tablet  Commonly known as:  VALIUM  Take 5 mg by mouth every 6 (six) hours as needed for muscle spasms.     doxycycline 100 MG capsule  Commonly known as:  VIBRAMYCIN  Take 1 capsule (100 mg total) by mouth 2 (two) times daily.     esomeprazole 40 MG capsule  Commonly known as:  NEXIUM  Take 40 mg by mouth See admin instructions. Take 1 capsule (40 mg) every morning, may take an additional capsule in the evening if needed for heartburn     HYDROcodone-acetaminophen 5-325 MG per tablet  Commonly known as:  NORCO/VICODIN  Take 0.5 tablets by mouth daily as needed for moderate pain.     ibuprofen 800 MG tablet  Commonly known as:  ADVIL,MOTRIN  Take 800 mg by mouth 2 (two) times daily as needed for headache or moderate pain.     insulin lispro protamine-lispro (75-25) 100 UNIT/ML Susp injection  Commonly known as:  HUMALOG 75/25 MIX  Inject 22-35 Units into the skin 2 (two) times daily with a meal. Sliding scale:  Morning dose (after breaksfast):  CBG 110-200 22 units, 201-275 28 units, 276-350 35 units; evening dose (after supper) 15 units unless CBG is >110, then use morning sliding scale.     loratadine 10 MG tablet  Commonly known as:  CLARITIN  Take 10 mg by mouth daily as needed for allergies.     mometasone 50 MCG/ACT nasal spray  Commonly known as:  NASONEX  Place 1 spray into the nose daily as needed (allergies).     mupirocin cream 2 %  Commonly known as:  BACTROBAN  Apply 1 application topically 2 (two) times daily as needed (wound care (boils)).     nitroGLYCERIN 0.4 mg/hr patch  Commonly known as:  NITRODUR - Dosed in mg/24 hr  Place 0.4 mg onto the skin as needed (chest pain).     nitroGLYCERIN 0.4 MG SL tablet  Commonly known as:  NITROSTAT  Place 0.4 mg under the tongue every 5 (five) minutes as needed for chest pain. Maximum 3 doses     ONE TOUCH ULTRA TEST test strip  Generic drug:  glucose blood     oxyCODONE-acetaminophen 5-325 MG per tablet  Commonly known as:  ROXICET  Take 1 tablet by mouth every 6 (six) hours as needed for severe pain.     potassium  chloride SA 20 MEQ tablet  Commonly known as:  K-DUR,KLOR-CON  Take 20 mEq by mouth daily as needed (for cramps).     PROAIR HFA 108 (90 BASE) MCG/ACT inhaler  Generic drug:  albuterol  Inhale 2 puffs into the lungs every 6 (six) hours as needed for wheezing or shortness of breath.     rosuvastatin 20 MG tablet  Commonly known as:  CRESTOR  Take 20 mg by mouth at bedtime.     SUMAtriptan 50 MG tablet  Commonly known as:  IMITREX  Take 50 mg by mouth every 2 (two) hours as needed for migraine.     tiZANidine 4 MG tablet  Commonly known as:  ZANAFLEX  Take 1 tablet (4 mg total) by mouth every 6 (six) hours as needed for muscle spasms.     TRIBENZOR 40-5-12.5 MG Tabs  Generic drug:  Olmesartan-Amlodipine-HCTZ  Take 1 tablet by mouth daily.     zolpidem 5 MG tablet  Commonly known as:  AMBIEN  Take 5 mg by mouth at bedtime as needed for sleep.           Follow-up Information    Follow up with Gerlad Pelzel L, MD In 3 weeks.   Specialty:  Neurosurgery   Why:  call to make an appointment   Contact information:   [address] [place] [id number] [phone]       Signed: Ericia Moxley L 04/11/2014, 10:09 AM

## 2014-04-11 NOTE — Discharge Instructions (Signed)

## 2014-04-12 NOTE — Anesthesia Postprocedure Evaluation (Signed)
Anesthesia Post Note  Patient: [name]  Procedure(s) Performed: Procedure(s) (LRB): Cervical Six-Seven ANTERIOR CERVICAL DECOMPRESSION/DISCECTOMY FUSION Plating and Bonegraft  2 LEVELS (N/A)  Anesthesia type: General  Patient location: PACU  Post pain: Pain level controlled and Adequate analgesia  Post assessment: Post-op Vital signs reviewed, Patient's Cardiovascular Status Stable, Respiratory Function Stable, Patent Airway and Pain level controlled  Last Vitals:  Filed Vitals:   04/11/14 0934  BP: 131/75  Pulse: 115  Temp: 36.8 C  Resp: 18    Post vital signs: Reviewed and stable  Level of consciousness: awake, alert  and oriented  Complications: No apparent anesthesia complications

## 2014-06-28 DIAGNOSIS — I10 Essential (primary) hypertension: Secondary | ICD-10-CM | POA: Diagnosis not present

## 2014-06-28 DIAGNOSIS — E119 Type 2 diabetes mellitus without complications: Secondary | ICD-10-CM | POA: Diagnosis not present

## 2014-06-28 DIAGNOSIS — E780 Pure hypercholesterolemia: Secondary | ICD-10-CM | POA: Diagnosis not present

## 2014-06-28 DIAGNOSIS — M549 Dorsalgia, unspecified: Secondary | ICD-10-CM | POA: Diagnosis not present

## 2014-07-21 DIAGNOSIS — M5412 Radiculopathy, cervical region: Secondary | ICD-10-CM | POA: Diagnosis not present

## 2014-08-11 DIAGNOSIS — M549 Dorsalgia, unspecified: Secondary | ICD-10-CM | POA: Diagnosis not present

## 2014-08-11 DIAGNOSIS — E119 Type 2 diabetes mellitus without complications: Secondary | ICD-10-CM | POA: Diagnosis not present

## 2014-08-11 DIAGNOSIS — I10 Essential (primary) hypertension: Secondary | ICD-10-CM | POA: Diagnosis not present

## 2014-08-30 DIAGNOSIS — M549 Dorsalgia, unspecified: Secondary | ICD-10-CM | POA: Diagnosis not present

## 2014-08-30 DIAGNOSIS — E119 Type 2 diabetes mellitus without complications: Secondary | ICD-10-CM | POA: Diagnosis not present

## 2014-08-30 DIAGNOSIS — I10 Essential (primary) hypertension: Secondary | ICD-10-CM | POA: Diagnosis not present

## 2014-09-07 DIAGNOSIS — M5412 Radiculopathy, cervical region: Secondary | ICD-10-CM | POA: Diagnosis not present

## 2014-09-08 ENCOUNTER — Encounter: Payer: Self-pay | Admitting: Internal Medicine

## 2014-09-08 DIAGNOSIS — M5412 Radiculopathy, cervical region: Secondary | ICD-10-CM | POA: Diagnosis not present

## 2014-09-08 DIAGNOSIS — I10 Essential (primary) hypertension: Secondary | ICD-10-CM | POA: Diagnosis not present

## 2014-09-14 ENCOUNTER — Emergency Department (HOSPITAL_COMMUNITY): Payer: Medicare Other

## 2014-09-14 ENCOUNTER — Encounter (HOSPITAL_COMMUNITY): Payer: Self-pay | Admitting: *Deleted

## 2014-09-14 ENCOUNTER — Other Ambulatory Visit: Payer: Self-pay

## 2014-09-14 ENCOUNTER — Emergency Department (HOSPITAL_COMMUNITY)
Admission: EM | Admit: 2014-09-14 | Discharge: 2014-09-14 | Disposition: A | Discharge status: Home / Self Care | Payer: Medicare Other | Attending: Emergency Medicine | Admitting: Emergency Medicine

## 2014-09-14 DIAGNOSIS — Z862 Personal history of diseases of the blood and blood-forming organs and certain disorders involving the immune mechanism: Secondary | ICD-10-CM | POA: Diagnosis not present

## 2014-09-14 DIAGNOSIS — J449 Chronic obstructive pulmonary disease, unspecified: Secondary | ICD-10-CM | POA: Insufficient documentation

## 2014-09-14 DIAGNOSIS — R51 Headache: Secondary | ICD-10-CM | POA: Diagnosis not present

## 2014-09-14 DIAGNOSIS — E785 Hyperlipidemia, unspecified: Secondary | ICD-10-CM | POA: Insufficient documentation

## 2014-09-14 DIAGNOSIS — Z8673 Personal history of transient ischemic attack (TIA), and cerebral infarction without residual deficits: Secondary | ICD-10-CM | POA: Diagnosis not present

## 2014-09-14 DIAGNOSIS — K219 Gastro-esophageal reflux disease without esophagitis: Secondary | ICD-10-CM | POA: Diagnosis not present

## 2014-09-14 DIAGNOSIS — Q394 Esophageal web: Secondary | ICD-10-CM | POA: Diagnosis not present

## 2014-09-14 DIAGNOSIS — E119 Type 2 diabetes mellitus without complications: Secondary | ICD-10-CM | POA: Diagnosis not present

## 2014-09-14 DIAGNOSIS — Z8701 Personal history of pneumonia (recurrent): Secondary | ICD-10-CM | POA: Diagnosis not present

## 2014-09-14 DIAGNOSIS — E669 Obesity, unspecified: Secondary | ICD-10-CM | POA: Insufficient documentation

## 2014-09-14 DIAGNOSIS — R531 Weakness: Secondary | ICD-10-CM | POA: Diagnosis not present

## 2014-09-14 DIAGNOSIS — Z794 Long term (current) use of insulin: Secondary | ICD-10-CM | POA: Diagnosis not present

## 2014-09-14 DIAGNOSIS — Z79899 Other long term (current) drug therapy: Secondary | ICD-10-CM | POA: Insufficient documentation

## 2014-09-14 DIAGNOSIS — Z8543 Personal history of malignant neoplasm of ovary: Secondary | ICD-10-CM | POA: Diagnosis not present

## 2014-09-14 DIAGNOSIS — Z720 Tobacco use: Secondary | ICD-10-CM | POA: Insufficient documentation

## 2014-09-14 DIAGNOSIS — F419 Anxiety disorder, unspecified: Secondary | ICD-10-CM | POA: Insufficient documentation

## 2014-09-14 DIAGNOSIS — Z7952 Long term (current) use of systemic steroids: Secondary | ICD-10-CM | POA: Diagnosis not present

## 2014-09-14 DIAGNOSIS — I10 Essential (primary) hypertension: Secondary | ICD-10-CM | POA: Diagnosis not present

## 2014-09-14 DIAGNOSIS — R29810 Facial weakness: Secondary | ICD-10-CM | POA: Diagnosis not present

## 2014-09-14 DIAGNOSIS — Z7902 Long term (current) use of antithrombotics/antiplatelets: Secondary | ICD-10-CM | POA: Insufficient documentation

## 2014-09-14 DIAGNOSIS — H538 Other visual disturbances: Secondary | ICD-10-CM | POA: Diagnosis not present

## 2014-09-14 DIAGNOSIS — M549 Dorsalgia, unspecified: Secondary | ICD-10-CM | POA: Diagnosis not present

## 2014-09-14 LAB — PROTIME-INR
INR: 1.03 (ref 0.00–1.49)
Prothrombin Time: 13.7 seconds (ref 11.6–15.2)

## 2014-09-14 LAB — CBG MONITORING, ED: Glucose-Capillary: 135 mg/dL — ABNORMAL HIGH (ref 70–99)

## 2014-09-14 LAB — COMPREHENSIVE METABOLIC PANEL
ALT: 17 U/L (ref 14–54)
AST: 20 U/L (ref 15–41)
Albumin: 3.9 g/dL (ref 3.5–5.0)
Alkaline Phosphatase: 68 U/L (ref 38–126)
Anion gap: 10 (ref 5–15)
BUN: 8 mg/dL (ref 6–20)
CO2: 26 mmol/L (ref 22–32)
Calcium: 9.7 mg/dL (ref 8.9–10.3)
Chloride: 105 mmol/L (ref 101–111)
Creatinine, Ser: 0.72 mg/dL (ref 0.44–1.00)
GFR calc Af Amer: >60 mL/min (ref >60)
GFR calc non Af Amer: >60 mL/min (ref >60)
Glucose, Bld: 164 mg/dL — ABNORMAL HIGH (ref 70–99)
Potassium: 3.7 mmol/L (ref 3.5–5.1)
Sodium: 141 mmol/L (ref 135–145)
Total Bilirubin: 0.7 mg/dL (ref 0.3–1.2)
Total Protein: 7 g/dL (ref 6.5–8.1)

## 2014-09-14 LAB — I-STAT CHEM 8, ED
BUN: 8 mg/dL (ref 6–20)
Calcium, Ion: 1.19 mmol/L (ref 1.12–1.23)
Chloride: 104 mmol/L (ref 101–111)
Creatinine, Ser: 0.6 mg/dL (ref 0.44–1.00)
Glucose, Bld: 164 mg/dL — ABNORMAL HIGH (ref 70–99)
HCT: 41 % (ref 36.0–46.0)
Hemoglobin: 13.9 g/dL (ref 12.0–15.0)
Potassium: 3.6 mmol/L (ref 3.5–5.1)
Sodium: 141 mmol/L (ref 135–145)
TCO2: 22 mmol/L (ref 0–100)

## 2014-09-14 LAB — DIFFERENTIAL
Basophils Absolute: 0 10*3/uL (ref 0.0–0.1)
Basophils Relative: 0 % (ref 0–1)
Eosinophils Absolute: 0 10*3/uL (ref 0.0–0.7)
Eosinophils Relative: 0 % (ref 0–5)
Lymphocytes Relative: 36 % (ref 12–46)
Lymphs Abs: 2.1 10*3/uL (ref 0.7–4.0)
Monocytes Absolute: 0.3 10*3/uL (ref 0.1–1.0)
Monocytes Relative: 5 % (ref 3–12)
Neutro Abs: 3.5 10*3/uL (ref 1.7–7.7)
Neutrophils Relative %: 59 % (ref 43–77)

## 2014-09-14 LAB — CBC
HCT: 39 % (ref 36.0–46.0)
Hemoglobin: 13 g/dL (ref 12.0–15.0)
MCH: 27.1 pg (ref 26.0–34.0)
MCHC: 33.3 g/dL (ref 30.0–36.0)
MCV: 81.4 fL (ref 78.0–100.0)
Platelets: 227 10*3/uL (ref 150–400)
RBC: 4.79 MIL/uL (ref 3.87–5.11)
RDW: 13.3 % (ref 11.5–15.5)
WBC: 6 10*3/uL (ref 4.0–10.5)

## 2014-09-14 LAB — I-STAT TROPONIN, ED: Troponin i, poc: 0 ng/mL (ref 0.00–0.08)

## 2014-09-14 LAB — APTT: aPTT: 33 seconds (ref 24–37)

## 2014-09-14 NOTE — ED Notes (Signed)
Pt states that she began having blurred vision and headache last night at 830pm. Pt states that she has also had left arm numbness since last night as well.  Pt has hx of stroke

## 2014-09-14 NOTE — ED Notes (Signed)
Spoke with MRI states sent patient back due to patient request but will be coming to get her now.

## 2014-09-14 NOTE — ED Provider Notes (Signed)
CSN: [id number]     Arrival date & time 09/14/14  1358 History   First MD Initiated Contact with Patient 09/14/14 1628     Chief Complaint  Patient presents with  . Headache  . Blurred Vision     (Consider location/radiation/quality/duration/timing/severity/associated sxs/prior Treatment) HPI Patient presents with concern of ongoing headache, blurry vision, dysphagia. Patient also has left arm dysesthesia, though this has been present for some time. Patient's new symptoms began yesterday, approximately 20 hours ago. Since onset symptoms of been persistent. Symptoms are similar to those she experienced with prior stroke. No new confusion, disorientation, complete visual loss per Vision changes are intermittent, occurring without clear precipitant. No chest pain, belly pain, syncope, all patients. No clear alleviating or exacerbating factors.  Past Medical History  Diagnosis Date  . Hypertension   . Anxiety   . Hyperlipidemia   . GERD (gastroesophageal reflux disease)   . Irritable bowel syndrome   . Sinus problem   . Hidradenitis     groin  . Anemia   . Schatzki's ring   . Diverticulosis of colon with hemorrhage April 2013  . Stomach ulcer 1972    non-bleeding  . Chronic asthmatic bronchitis   . Obesity   . Left-sided weakness     since stroke, left eye trouble seeing  . Cancer 1985    ovarian, no treatment except surgery  . Anginal pain   . Pneumonia ~ 2011  . Chronic bronchitis     "I get it q yr" (09/14/2012)  . Type II diabetes mellitus   . H/O hiatal hernia   . Chronic headaches     "daily lately; sometimes monthly" (09/14/2012)  . Migraines   . Arthritis     "neck, left hand" (09/14/2012)  . Chest pain 09-11-2013    LEFT SIDED  . Heart attack     2003 mild MI, March 2013 mild MI  . Stroke 07-2007, 07-2008, 07-2009    total 3 strokes  . History of blood transfusion 1985 AND 2013  . Complication of anesthesia     OCCASIONAL TROUBLE TURNING NECK TO RIGHT  .  Vitamin B 12 deficiency 07-15-2013   Past Surgical History  Procedure Laterality Date  . Hiatal hernia repair    . Breast reduction surgery    . Axillary hidradenitis excision  1990-2008    bilateral  . Hydradenitis excision  01/2011; 03/2012    'groin and abdomen; 03/2012" (09/14/2012)  . Anterior cervical decomp/discectomy fusion  2002  . Colonoscopy  08/12/2011    Procedure: COLONOSCOPY;  Surgeon: Ladene Artist, MD,FACG;  Location: [facility] ENDOSCOPY;  Service: Endoscopy;  Laterality: N/A;  . Esophagogastroduodenoscopy  08/12/2011    Procedure: ESOPHAGOGASTRODUODENOSCOPY (EGD);  Surgeon: Ladene Artist, MD,FACG;  Location: [facility] ENDOSCOPY;  Service: Endoscopy;  Laterality: N/A;  . Givens capsule study  08/13/2011    Procedure: GIVENS CAPSULE STUDY;  Surgeon: Ladene Artist, MD,FACG;  Location: [place];  Service: Endoscopy;  Laterality: N/A;  . Nissen fundoplication  2409  . Uvulopalatopharyngoplasty, tonsillectomy and septoplasty  2000's  . Hammer toe surgery Bilateral ~ 2000  . Colonoscopy  06/19/2006  . Esophagogastroduodenoscopy  06/04/2005  . Cardiac catheterization  2004    mild disease  . Back surgery    . Abdominal hysterectomy  1985  . Cholecystectomy  1990's  . Hydradenitis excision  04/01/2012    Procedure: EXCISION HYDRADENITIS GROIN;  Surgeon: Pedro Earls, MD;  Location: [facility] ORS;  Service: General;  Laterality: Bilateral;  Excision of Hydradenitis of Perineum  . Appendectomy  1985  . Breast cyst excision Right 2008  . Reduction mammaplasty  1996?  Marland Kitchen Breast biopsy Right 2007  . Posterior lumbar fusion  2008 X 2  . Tonsillectomy and adenoidectomy  1959 AND 2000  . Hydradenitis excision N/A 09/17/2013    Procedure: EXCISION PERINEAL HIDRADENITIS ;  Surgeon: Pedro Earls, MD;  Location: [facility];  Service: General;  Laterality: N/A;  also in the pubis area  . Mass excision Right 09/17/2013    Procedure: EXCISION MASS;  Surgeon: Pedro Earls, MD;  Location: [facility];   Service: General;  Laterality: Right;  . Joint replacement    . Anterior cervical decomp/discectomy fusion N/A 04/08/2014    Procedure: Cervical Six-Seven ANTERIOR CERVICAL DECOMPRESSION/DISCECTOMY FUSION Plating and Bonegraft  2 LEVELS;  Surgeon: Ashok Pall, MD;  Location: [place] [facility];  Service: Neurosurgery;  Laterality: N/A;  Cervical Six-Seven ANTERIOR CERVICAL DECOMPRESSION/DISCECTOMY FUSION Plating and Bonegraft  2 LEVELS   Family History  Problem Relation Age of Onset  . Breast cancer Mother   . Heart disease Mother   . Hypertension Mother   . Diabetes Father   . Heart disease Father   . Stroke Father   . Heart disease Sister   . Hypertension Sister   . Hypertension Sister   . Hyperlipidemia Sister   . Diabetes Sister   . Emphysema      great uncle  . Aneurysm Sister     brain   History  Substance Use Topics  . Smoking status: Current Every Day Smoker -- 0.33 packs/day for 41 years    Types: Cigarettes    Last Attempt to Quit: 07/25/2013  . Smokeless tobacco: Never Used  . Alcohol Use: No   OB History    No data available     Review of Systems  Constitutional:       Per HPI, otherwise negative  HENT:       Per HPI, otherwise negative  Respiratory:       Per HPI, otherwise negative  Cardiovascular:       Per HPI, otherwise negative  Gastrointestinal: Negative for vomiting.  Endocrine:       Negative aside from HPI  Genitourinary:       Neg aside from HPI   Musculoskeletal:       Per HPI, otherwise negative  Skin: Negative.   Neurological: Positive for speech difficulty, weakness and headaches. Negative for syncope.      Allergies  Bactrim; Sulfa antibiotics; Fish allergy; Iodine; Metformin and related; Adhesive; and Shellfish allergy  Home Medications   Prior to Admission medications   Medication Sig Start Date End Date Taking? Authorizing Provider  albuterol (PROAIR HFA) 108 (90 BASE) MCG/ACT inhaler Inhale 2 puffs into the lungs every 6 (six)  hours as needed for wheezing or shortness of breath.   Yes Historical Provider, MD  ALPRAZolam Duanne Moron) 0.5 MG tablet Take 0.25-0.5 mg by mouth 2 (two) times daily as needed for anxiety.    Yes Historical Provider, MD  b complex vitamins tablet Take 1 tablet by mouth daily.   Yes Historical Provider, MD  Calcium Carb-Cholecalciferol (CALCIUM 600 + D PO) Take 1 tablet by mouth daily.   Yes Historical Provider, MD  celecoxib (CELEBREX) 200 MG capsule Take 200 mg by mouth daily as needed for mild pain.  02/15/14  Yes Historical Provider, MD  clopidogrel (PLAVIX) 75 MG tablet Take 75 mg by mouth at  bedtime.    Yes Historical Provider, MD  diazepam (VALIUM) 5 MG tablet Take 5 mg by mouth every 6 (six) hours as needed for muscle spasms.  01/21/14  Yes Historical Provider, MD  esomeprazole (NEXIUM) 40 MG capsule Take 40 mg by mouth 2 (two) times daily.    Yes Historical Provider, MD  HYDROcodone-acetaminophen (NORCO/VICODIN) 5-325 MG per tablet Take 0.5 tablets by mouth daily as needed for moderate pain.   Yes Historical Provider, MD  ibuprofen (ADVIL,MOTRIN) 800 MG tablet Take 800 mg by mouth 2 (two) times daily as needed for headache or moderate pain.   Yes Historical Provider, MD  insulin lispro protamine-lispro (HUMALOG 75/25 MIX) (75-25) 100 UNIT/ML SUSP injection Inject 22-35 Units into the skin 2 (two) times daily with a meal. Sliding scale:  Morning dose (after breaksfast):  CBG 110-200 22 units, 201-275 28 units, 276-350 35 units; evening dose (after supper) 15 units unless CBG is >110, then use morning sliding scale.   Yes Historical Provider, MD  loratadine (CLARITIN) 10 MG tablet Take 10 mg by mouth daily as needed for allergies.   Yes Historical Provider, MD  mometasone (NASONEX) 50 MCG/ACT nasal spray Place 1 spray into the nose daily as needed (allergies).    Yes Historical Provider, MD  mupirocin cream (BACTROBAN) 2 % Apply 1 application topically 2 (two) times daily as needed (wound care  (boils)).    Yes Historical Provider, MD  Olmesartan-Amlodipine-HCTZ (TRIBENZOR) 40-5-12.5 MG TABS Take 1 tablet by mouth daily.    Yes Historical Provider, MD  potassium chloride SA (K-DUR,KLOR-CON) 20 MEQ tablet Take 20 mEq by mouth daily as needed (for cramps).   Yes Historical Provider, MD  rosuvastatin (CRESTOR) 20 MG tablet Take 20 mg by mouth at bedtime.    Yes Historical Provider, MD  SUMAtriptan (IMITREX) 50 MG tablet Take 50 mg by mouth every 2 (two) hours as needed for migraine.   Yes Historical Provider, MD  tiZANidine (ZANAFLEX) 4 MG tablet Take 1 tablet (4 mg total) by mouth every 6 (six) hours as needed for muscle spasms. 04/11/14  Yes Ashok Pall, MD  zolpidem (AMBIEN) 5 MG tablet Take 5 mg by mouth at bedtime as needed for sleep.  12/17/13  Yes Historical Provider, MD  doxycycline (VIBRAMYCIN) 100 MG capsule Take 1 capsule (100 mg total) by mouth 2 (two) times daily. Patient not taking: Reported on 09/14/2014 12/24/13   Johnathan Hausen, MD  nitroGLYCERIN (NITRODUR - DOSED IN MG/24 HR) 0.4 mg/hr Place 0.4 mg onto the skin as needed (chest pain).     Historical Provider, MD  nitroGLYCERIN (NITROSTAT) 0.4 MG SL tablet Place 0.4 mg under the tongue every 5 (five) minutes as needed for chest pain. Maximum 3 doses    Historical Provider, MD  ONE TOUCH ULTRA TEST test strip  09/10/13   Historical Provider, MD  oxyCODONE-acetaminophen (ROXICET) 5-325 MG per tablet Take 1 tablet by mouth every 6 (six) hours as needed for severe pain. Patient not taking: Reported on 09/14/2014 04/11/14   Ashok Pall, MD   BP 121/64 mmHg  Pulse 49  Temp(Src) 98.4 F (36.9 C) (Oral)  Resp 23  Ht 5\' 4"  (1.626 m)  Wt 139 lb (63.05 kg)  BMI 23.85 kg/m2  SpO2 100% Physical Exam  Constitutional: She is oriented to person, place, and time. She appears well-developed and well-nourished. No distress.  HENT:  Head: Normocephalic and atraumatic.  Eyes: Conjunctivae and EOM are normal.  Cardiovascular: Normal rate  and regular rhythm.  Pulmonary/Chest: Effort normal and breath sounds normal. No stridor. No respiratory distress.  Abdominal: She exhibits no distension.  Musculoskeletal: She exhibits no edema.  Neurological: She is alert and oriented to person, place, and time. No cranial nerve deficit.  Left facial droop, mild slurring of speech, otherwise cranial nerves are unremarkable. Strength is symmetric, 5/5 upper and lower extremities.  Skin: Skin is warm and dry.  Psychiatric: She has a normal mood and affect.  Nursing note and vitals reviewed.   ED Course  Procedures (including critical care time) Labs Review Labs Reviewed  COMPREHENSIVE METABOLIC PANEL - Abnormal; Notable for the following:    Glucose, Bld 164 (*)    All other components within normal limits  I-STAT CHEM 8, ED - Abnormal; Notable for the following:    Glucose, Bld 164 (*)    All other components within normal limits  CBG MONITORING, ED - Abnormal; Notable for the following:    Glucose-Capillary 135 (*)    All other components within normal limits  PROTIME-INR  APTT  CBC  DIFFERENTIAL  Randolm Idol, ED    Imaging Review Mr Brain Wo Contrast (neuro Protocol)  09/14/2014   CLINICAL DATA:  Blurred vision and headache beginning last night. Left arm numbness. Left facial droop. Prior stroke.  EXAM: MRI HEAD WITHOUT CONTRAST  TECHNIQUE: Multiplanar, multiecho pulse sequences of the brain and surrounding structures were obtained without intravenous contrast.  COMPARISON:  09/14/2012  FINDINGS: There is no evidence of acute infarct, intracranial hemorrhage, mass, midline shift, or extra-axial fluid collection. Ventricles and sulci are within normal limits for age. No significant white matter disease is identified for age.  Orbits are unremarkable. No significant inflammatory changes are seen in the paranasal sinuses or mastoid air cells. Major intracranial vascular flow voids are preserved. Chronic enlargement of the  diploic space of the skull is unchanged. Bilateral parotid ductal dilatation is again seen both within and external to the parotid glands.  IMPRESSION: Unremarkable appearance of the brain for age without evidence of acute intracranial abnormality.   Electronically Signed   By: Logan Bores   On: 09/14/2014 19:28   Chart review demonstrates prior stroke via MRI, in the distant past.  7:47 PM On repeat exam the patient is awake, alert, sitting upright, eating, with no ongoing complaints. We discussed all findings, and the need to follow-up with neurology. Patient is already taking appropriate stroke prophylaxis.   MDM   Patient persist with new speech difficulty, dysesthesia in the left arm. Here the patient is awake, alert, oriented appropriately, with no MRI evidence for acute new stroke. Patient does have history of prior strokes, and is currently taking appropriate medication for stroke prevention. With no evidence for distress, patient was discharged in stable condition to follow with neurology for completion of her evaluation.     Carmin Muskrat, MD 09/14/14 [id number]

## 2014-09-14 NOTE — Discharge Instructions (Signed)
As discussed, your evaluation today has been largely reassuring.  But, it is important that you monitor your condition carefully, and do not hesitate to return to the ED if you develop new, or concerning changes in your condition.  Otherwise, please follow-up with your physician for appropriate ongoing care.  Please use the provided referral information to follow-up with one of our local neurologists.

## 2014-09-14 NOTE — ED Notes (Signed)
Left facial droop at triage. No other neuro deficits.

## 2014-10-06 ENCOUNTER — Encounter (HOSPITAL_COMMUNITY): Payer: Self-pay

## 2014-10-06 ENCOUNTER — Emergency Department (HOSPITAL_COMMUNITY)
Admission: EM | Admit: 2014-10-06 | Discharge: 2014-10-06 | Disposition: A | Discharge status: Home / Self Care | Payer: Medicare Other | Attending: Emergency Medicine | Admitting: Emergency Medicine

## 2014-10-06 ENCOUNTER — Emergency Department (HOSPITAL_COMMUNITY): Payer: Medicare Other

## 2014-10-06 DIAGNOSIS — Z79899 Other long term (current) drug therapy: Secondary | ICD-10-CM | POA: Diagnosis not present

## 2014-10-06 DIAGNOSIS — F419 Anxiety disorder, unspecified: Secondary | ICD-10-CM | POA: Insufficient documentation

## 2014-10-06 DIAGNOSIS — Z8543 Personal history of malignant neoplasm of ovary: Secondary | ICD-10-CM | POA: Diagnosis not present

## 2014-10-06 DIAGNOSIS — K219 Gastro-esophageal reflux disease without esophagitis: Secondary | ICD-10-CM | POA: Insufficient documentation

## 2014-10-06 DIAGNOSIS — Z862 Personal history of diseases of the blood and blood-forming organs and certain disorders involving the immune mechanism: Secondary | ICD-10-CM | POA: Insufficient documentation

## 2014-10-06 DIAGNOSIS — Z794 Long term (current) use of insulin: Secondary | ICD-10-CM | POA: Insufficient documentation

## 2014-10-06 DIAGNOSIS — I10 Essential (primary) hypertension: Secondary | ICD-10-CM | POA: Insufficient documentation

## 2014-10-06 DIAGNOSIS — Q394 Esophageal web: Secondary | ICD-10-CM | POA: Diagnosis not present

## 2014-10-06 DIAGNOSIS — Z8701 Personal history of pneumonia (recurrent): Secondary | ICD-10-CM | POA: Insufficient documentation

## 2014-10-06 DIAGNOSIS — Z7902 Long term (current) use of antithrombotics/antiplatelets: Secondary | ICD-10-CM | POA: Insufficient documentation

## 2014-10-06 DIAGNOSIS — M79671 Pain in right foot: Secondary | ICD-10-CM

## 2014-10-06 DIAGNOSIS — G8929 Other chronic pain: Secondary | ICD-10-CM | POA: Insufficient documentation

## 2014-10-06 DIAGNOSIS — E119 Type 2 diabetes mellitus without complications: Secondary | ICD-10-CM | POA: Insufficient documentation

## 2014-10-06 DIAGNOSIS — M79672 Pain in left foot: Secondary | ICD-10-CM | POA: Diagnosis not present

## 2014-10-06 DIAGNOSIS — E669 Obesity, unspecified: Secondary | ICD-10-CM | POA: Diagnosis not present

## 2014-10-06 DIAGNOSIS — Z8673 Personal history of transient ischemic attack (TIA), and cerebral infarction without residual deficits: Secondary | ICD-10-CM | POA: Diagnosis not present

## 2014-10-06 DIAGNOSIS — M1990 Unspecified osteoarthritis, unspecified site: Secondary | ICD-10-CM | POA: Diagnosis not present

## 2014-10-06 DIAGNOSIS — Z791 Long term (current) use of non-steroidal anti-inflammatories (NSAID): Secondary | ICD-10-CM | POA: Insufficient documentation

## 2014-10-06 DIAGNOSIS — Z720 Tobacco use: Secondary | ICD-10-CM | POA: Insufficient documentation

## 2014-10-06 DIAGNOSIS — G43909 Migraine, unspecified, not intractable, without status migrainosus: Secondary | ICD-10-CM | POA: Insufficient documentation

## 2014-10-06 DIAGNOSIS — Z8709 Personal history of other diseases of the respiratory system: Secondary | ICD-10-CM | POA: Diagnosis not present

## 2014-10-06 DIAGNOSIS — M7981 Nontraumatic hematoma of soft tissue: Secondary | ICD-10-CM | POA: Diagnosis not present

## 2014-10-06 DIAGNOSIS — Z792 Long term (current) use of antibiotics: Secondary | ICD-10-CM | POA: Insufficient documentation

## 2014-10-06 DIAGNOSIS — Z7951 Long term (current) use of inhaled steroids: Secondary | ICD-10-CM | POA: Insufficient documentation

## 2014-10-06 DIAGNOSIS — Z872 Personal history of diseases of the skin and subcutaneous tissue: Secondary | ICD-10-CM | POA: Insufficient documentation

## 2014-10-06 HISTORY — DX: Polyneuropathy, unspecified: G62.9

## 2014-10-06 LAB — CBG MONITORING, ED: Glucose-Capillary: 155 mg/dL — ABNORMAL HIGH (ref 65–99)

## 2014-10-06 MED ORDER — TRAMADOL HCL 50 MG PO TABS: 50.0000 mg | ORAL_TABLET | Freq: Four times a day (QID) | ORAL | Status: DC | PRN

## 2014-10-06 NOTE — ED Notes (Signed)
Pt reports is diabetic and c/o bilateral foot pain since April.  No swelling or wound noted.  Denies any injury.

## 2014-10-06 NOTE — ED Notes (Signed)
Pain both feet.  Lt foot hurts since April.  No swelling or visible trauma.

## 2014-10-06 NOTE — ED Provider Notes (Signed)
CSN: [id number]     Arrival date & time 10/06/14  1446 History   First MD Initiated Contact with Patient 10/06/14 1728     Chief Complaint  Patient presents with  . Foot Pain     (Consider location/radiation/quality/duration/timing/severity/associated sxs/prior Treatment) The history is provided by the patient.   [name] is a 59 y.o. female presenting with one month history of bilateral foot pain, starting with her left foot.  She describes both a tender sensation along her left third fourth and fifth toes which radiates across her foot but also has episodes of normal sensation as well as.  Over the past 2 weeks she started feeling similar symptoms on the right as well.  She denies any wounds, injury or activity changes that would splinting her pain.  She is an insulin-dependent diabetic.  She's been trying to establish care with a podiatrist at the rectal recommendation of her endocrinologist Dr. Carlis Abbott.  She has been placed on Neurontin in the past which she did not tolerate.  She denies swelling, redness or any skin injuries.  Her blood sugars have been fairly well controlled which she states she checks daily.    Past Medical History  Diagnosis Date  . Hypertension   . Anxiety   . Hyperlipidemia   . GERD (gastroesophageal reflux disease)   . Irritable bowel syndrome   . Sinus problem   . Hidradenitis     groin  . Anemia   . Schatzki's ring   . Diverticulosis of colon with hemorrhage April 2013  . Stomach ulcer 1972    non-bleeding  . Chronic asthmatic bronchitis   . Obesity   . Left-sided weakness     since stroke, left eye trouble seeing  . Cancer 1985    ovarian, no treatment except surgery  . Anginal pain   . Pneumonia ~ 2011  . Chronic bronchitis     "I get it q yr" (09/14/2012)  . Type II diabetes mellitus   . H/O hiatal hernia   . Chronic headaches     "daily lately; sometimes monthly" (09/14/2012)  . Migraines   . Arthritis     "neck, left hand"  (09/14/2012)  . Chest pain 09-11-2013    LEFT SIDED  . Heart attack     2003 mild MI, March 2013 mild MI  . Stroke 07-2007, 07-2008, 07-2009    total 3 strokes  . History of blood transfusion 1985 AND 2013  . Complication of anesthesia     OCCASIONAL TROUBLE TURNING NECK TO RIGHT  . Vitamin B 12 deficiency 07-15-2013  . Neuropathy    Past Surgical History  Procedure Laterality Date  . Hiatal hernia repair    . Breast reduction surgery    . Axillary hidradenitis excision  1990-2008    bilateral  . Hydradenitis excision  01/2011; 03/2012    'groin and abdomen; 03/2012" (09/14/2012)  . Anterior cervical decomp/discectomy fusion  2002  . Colonoscopy  08/12/2011    Procedure: COLONOSCOPY;  Surgeon: Ladene Artist, MD,FACG;  Location: [facility] ENDOSCOPY;  Service: Endoscopy;  Laterality: N/A;  . Esophagogastroduodenoscopy  08/12/2011    Procedure: ESOPHAGOGASTRODUODENOSCOPY (EGD);  Surgeon: Ladene Artist, MD,FACG;  Location: [facility] ENDOSCOPY;  Service: Endoscopy;  Laterality: N/A;  . Givens capsule study  08/13/2011    Procedure: GIVENS CAPSULE STUDY;  Surgeon: Ladene Artist, MD,FACG;  Location: [place];  Service: Endoscopy;  Laterality: N/A;  . Nissen fundoplication  5621  . Uvulopalatopharyngoplasty, tonsillectomy and  septoplasty  2000's  . Hammer toe surgery Bilateral ~ 2000  . Colonoscopy  06/19/2006  . Esophagogastroduodenoscopy  06/04/2005  . Cardiac catheterization  2004    mild disease  . Back surgery    . Abdominal hysterectomy  1985  . Cholecystectomy  1990's  . Hydradenitis excision  04/01/2012    Procedure: EXCISION HYDRADENITIS GROIN;  Surgeon: Pedro Earls, MD;  Location: [facility] ORS;  Service: General;  Laterality: Bilateral;  Excision of Hydradenitis of Perineum  . Appendectomy  1985  . Breast cyst excision Right 2008  . Reduction mammaplasty  1996?  Marland Kitchen Breast biopsy Right 2007  . Posterior lumbar fusion  2008 X 2  . Tonsillectomy and adenoidectomy  1959 AND 2000  . Hydradenitis  excision N/A 09/17/2013    Procedure: EXCISION PERINEAL HIDRADENITIS ;  Surgeon: Pedro Earls, MD;  Location: [facility] ORS;  Service: General;  Laterality: N/A;  also in the pubis area  . Mass excision Right 09/17/2013    Procedure: EXCISION MASS;  Surgeon: Pedro Earls, MD;  Location: [facility] ORS;  Service: General;  Laterality: Right;  . Joint replacement    . Anterior cervical decomp/discectomy fusion N/A 04/08/2014    Procedure: Cervical Six-Seven ANTERIOR CERVICAL DECOMPRESSION/DISCECTOMY FUSION Plating and Bonegraft  2 LEVELS;  Surgeon: Ashok Pall, MD;  Location: [place] NEURO ORS;  Service: Neurosurgery;  Laterality: N/A;  Cervical Six-Seven ANTERIOR CERVICAL DECOMPRESSION/DISCECTOMY FUSION Plating and Bonegraft  2 LEVELS   Family History  Problem Relation Age of Onset  . Breast cancer Mother   . Heart disease Mother   . Hypertension Mother   . Diabetes Father   . Heart disease Father   . Stroke Father   . Heart disease Sister   . Hypertension Sister   . Hypertension Sister   . Hyperlipidemia Sister   . Diabetes Sister   . Emphysema      great uncle  . Aneurysm Sister     brain   History  Substance Use Topics  . Smoking status: Current Every Day Smoker -- 0.33 packs/day for 41 years    Types: Cigarettes    Last Attempt to Quit: 07/25/2013  . Smokeless tobacco: Never Used  . Alcohol Use: No   OB History    No data available     Review of Systems  Constitutional: Negative for fever and chills.  Musculoskeletal: Positive for arthralgias. Negative for myalgias and joint swelling.  Skin: Negative.   Neurological: Negative for weakness and numbness.      Allergies  Bactrim; Sulfa antibiotics; Fish allergy; Iodine; Metformin and related; Adhesive; and Shellfish allergy  Home Medications   Prior to Admission medications   Medication Sig Start Date End Date Taking? Authorizing Provider  albuterol (PROAIR HFA) 108 (90 BASE) MCG/ACT inhaler Inhale 2 puffs into the lungs every  6 (six) hours as needed for wheezing or shortness of breath.    Historical Provider, MD  ALPRAZolam Duanne Moron) 0.5 MG tablet Take 0.25-0.5 mg by mouth 2 (two) times daily as needed for anxiety.     Historical Provider, MD  b complex vitamins tablet Take 1 tablet by mouth daily.    Historical Provider, MD  Calcium Carb-Cholecalciferol (CALCIUM 600 + D PO) Take 1 tablet by mouth daily.    Historical Provider, MD  celecoxib (CELEBREX) 200 MG capsule Take 200 mg by mouth daily as needed for mild pain.  02/15/14   Historical Provider, MD  clopidogrel (PLAVIX) 75 MG tablet Take 75 mg by  mouth at bedtime.     Historical Provider, MD  diazepam (VALIUM) 5 MG tablet Take 5 mg by mouth every 6 (six) hours as needed for muscle spasms.  01/21/14   Historical Provider, MD  doxycycline (VIBRAMYCIN) 100 MG capsule Take 1 capsule (100 mg total) by mouth 2 (two) times daily. Patient not taking: Reported on 09/14/2014 12/24/13   Johnathan Hausen, MD  esomeprazole (NEXIUM) 40 MG capsule Take 40 mg by mouth 2 (two) times daily.     Historical Provider, MD  HYDROcodone-acetaminophen (NORCO/VICODIN) 5-325 MG per tablet Take 0.5 tablets by mouth daily as needed for moderate pain.    Historical Provider, MD  ibuprofen (ADVIL,MOTRIN) 800 MG tablet Take 800 mg by mouth 2 (two) times daily as needed for headache or moderate pain.    Historical Provider, MD  insulin lispro protamine-lispro (HUMALOG 75/25 MIX) (75-25) 100 UNIT/ML SUSP injection Inject 22-35 Units into the skin 2 (two) times daily with a meal. Sliding scale:  Morning dose (after breaksfast):  CBG 110-200 22 units, 201-275 28 units, 276-350 35 units; evening dose (after supper) 15 units unless CBG is >110, then use morning sliding scale.    Historical Provider, MD  loratadine (CLARITIN) 10 MG tablet Take 10 mg by mouth daily as needed for allergies.    Historical Provider, MD  mometasone (NASONEX) 50 MCG/ACT nasal spray Place 1 spray into the nose daily as needed  (allergies).     Historical Provider, MD  mupirocin cream (BACTROBAN) 2 % Apply 1 application topically 2 (two) times daily as needed (wound care (boils)).     Historical Provider, MD  nitroGLYCERIN (NITRODUR - DOSED IN MG/24 HR) 0.4 mg/hr Place 0.4 mg onto the skin as needed (chest pain).     Historical Provider, MD  nitroGLYCERIN (NITROSTAT) 0.4 MG SL tablet Place 0.4 mg under the tongue every 5 (five) minutes as needed for chest pain. Maximum 3 doses    Historical Provider, MD  Olmesartan-Amlodipine-HCTZ (TRIBENZOR) 40-5-12.5 MG TABS Take 1 tablet by mouth daily.     Historical Provider, MD  ONE TOUCH ULTRA TEST test strip  09/10/13   Historical Provider, MD  oxyCODONE-acetaminophen (ROXICET) 5-325 MG per tablet Take 1 tablet by mouth every 6 (six) hours as needed for severe pain. Patient not taking: Reported on 09/14/2014 04/11/14   Ashok Pall, MD  potassium chloride SA (K-DUR,KLOR-CON) 20 MEQ tablet Take 20 mEq by mouth daily as needed (for cramps).    Historical Provider, MD  rosuvastatin (CRESTOR) 20 MG tablet Take 20 mg by mouth at bedtime.     Historical Provider, MD  SUMAtriptan (IMITREX) 50 MG tablet Take 50 mg by mouth every 2 (two) hours as needed for migraine.    Historical Provider, MD  tiZANidine (ZANAFLEX) 4 MG tablet Take 1 tablet (4 mg total) by mouth every 6 (six) hours as needed for muscle spasms. 04/11/14   Ashok Pall, MD  traMADol (ULTRAM) 50 MG tablet Take 1 tablet (50 mg total) by mouth every 6 (six) hours as needed. 10/06/14   Evalee Jefferson, PA-C  zolpidem (AMBIEN) 5 MG tablet Take 5 mg by mouth at bedtime as needed for sleep.  12/17/13   Historical Provider, MD   BP 110/79 mmHg  Pulse 109  Temp(Src) 99.1 F (37.3 C) (Oral)  Resp 20  Ht 5\' 4"  (1.626 m)  Wt 137 lb (62.143 kg)  BMI 23.50 kg/m2  SpO2 100% Physical Exam  Constitutional: She appears well-developed and well-nourished.  HENT:  Head:  Atraumatic.  Neck: Normal range of motion.  Cardiovascular: Normal rate and  regular rhythm.   Pulses:      Dorsalis pedis pulses are 2+ on the right side, and 2+ on the left side.  Pulses equal bilaterally. Recheck of pulse during exam - 90.  Musculoskeletal: She exhibits tenderness.  Neurological: She is alert. She has normal strength. She displays normal reflexes. No sensory deficit.  Skin: Skin is warm and dry. No rash noted. No erythema.  No wounds to either foot.  Skin is warm and dry. Less than 3 sec distal cap refill in toes.  4th left toe appears slightly bruised on plantar surface,  Cap refill normal.  Psychiatric: She has a normal mood and affect.    ED Course  Procedures (including critical care time) Labs Review Labs Reviewed  CBG MONITORING, ED - Abnormal; Notable for the following:    Glucose-Capillary 155 (*)    All other components within normal limits    Imaging Review Dg Foot Complete Left  10/06/2014   CLINICAL DATA:  Bilateral foot pain for 2 months. No swelling or visible trauma. History of hammertoe surgery  EXAM: LEFT FOOT - COMPLETE 3+ VIEW  COMPARISON:  None.  FINDINGS: No fracture or dislocation. There is presumed surgical absence of the distal end of the proximal phalanx of the fifth digit.  Joint spaces are preserved. No erosions. An accessory sesamoid bone is noted about the medial aspect of the second metatarsal head Regional soft tissues appear normal. No radiopaque foreign body.  IMPRESSION: No acute findings. No definite explanation for patient's persistent foot pain.   Electronically Signed   By: Sandi Mariscal M.D.   On: 10/06/2014 18:20   Dg Foot Complete Right  10/06/2014   CLINICAL DATA:  Chronic bilateral foot pain for 2 months. Initial encounter.  EXAM: RIGHT FOOT COMPLETE - 3+ VIEW  COMPARISON:  Right ankle radiographs performed 08/13/2004  FINDINGS: There is no evidence of fracture or dislocation. The pin within the distal first metatarsal is unremarkable in appearance. The joint spaces are preserved. There is no evidence of  talar subluxation; the subtalar joint is unremarkable in appearance.  No significant soft tissue abnormalities are seen.  IMPRESSION: No evidence of fracture or dislocation.   Electronically Signed   By: Garald Balding M.D.   On: 10/06/2014 18:19     EKG Interpretation None      MDM   Final diagnoses:  Foot pain, bilateral    Patients labs and/or radiological studies were reviewed and considered during the medical decision making and disposition process.  Results were also discussed with patient. Suspect pt may have peripheral neuropathy. Tramadol prescribed - advised that this is a short-term solution and she will need to establish care with the podiatrist.  She has no redness or swelling which would suggest infection.   Evalee Jefferson, PA-C 10/06/14 1927  Milton Ferguson, MD 10/06/14 2237

## 2014-10-06 NOTE — Discharge Instructions (Signed)
As discussed,  I suspect your pain may be related to peripheral neuropathy.  Your xrays and exam are ok today.  Keep your appointments with your primary doctor and the podiatrist as planned.  You may take the tramadol prescribed for pain relief.  This will make you drowsy - do not drive within 4 hours of taking this medication.

## 2014-10-19 DIAGNOSIS — I739 Peripheral vascular disease, unspecified: Secondary | ICD-10-CM | POA: Diagnosis not present

## 2014-10-19 DIAGNOSIS — E1140 Type 2 diabetes mellitus with diabetic neuropathy, unspecified: Secondary | ICD-10-CM | POA: Diagnosis not present

## 2014-10-19 DIAGNOSIS — T148 Other injury of unspecified body region: Secondary | ICD-10-CM | POA: Diagnosis not present

## 2014-10-20 ENCOUNTER — Telehealth (HOSPITAL_COMMUNITY): Payer: Self-pay | Admitting: *Deleted

## 2014-10-25 ENCOUNTER — Other Ambulatory Visit (HOSPITAL_COMMUNITY): Payer: Self-pay | Admitting: Foot & Ankle Surgery

## 2014-10-25 DIAGNOSIS — I739 Peripheral vascular disease, unspecified: Secondary | ICD-10-CM

## 2014-10-26 DIAGNOSIS — E119 Type 2 diabetes mellitus without complications: Secondary | ICD-10-CM | POA: Diagnosis not present

## 2014-10-31 ENCOUNTER — Ambulatory Visit (HOSPITAL_COMMUNITY)
Admission: RE | Admit: 2014-10-31 | Discharge: 2014-10-31 | Disposition: A | Discharge status: Home / Self Care | Payer: Medicare Other | Source: Ambulatory Visit | Attending: Cardiovascular Disease | Admitting: Cardiovascular Disease

## 2014-10-31 ENCOUNTER — Encounter: Payer: Self-pay | Admitting: Cardiovascular Disease

## 2014-10-31 ENCOUNTER — Other Ambulatory Visit: Payer: Self-pay

## 2014-10-31 DIAGNOSIS — E1151 Type 2 diabetes mellitus with diabetic peripheral angiopathy without gangrene: Secondary | ICD-10-CM | POA: Diagnosis not present

## 2014-10-31 DIAGNOSIS — I739 Peripheral vascular disease, unspecified: Secondary | ICD-10-CM | POA: Diagnosis not present

## 2014-10-31 DIAGNOSIS — I10 Essential (primary) hypertension: Secondary | ICD-10-CM | POA: Diagnosis not present

## 2014-10-31 NOTE — Progress Notes (Signed)
ABI Completed. Right side is normal and the left side is and  .60 consistent with moderate arterial disease. Oda Cogan, BS, RDMS, RVT

## 2014-11-01 ENCOUNTER — Ambulatory Visit (HOSPITAL_COMMUNITY)
Admission: RE | Admit: 2014-11-01 | Discharge: 2014-11-01 | Disposition: A | Discharge status: Home / Self Care | Payer: Medicare Other | Source: Ambulatory Visit | Attending: Cardiovascular Disease | Admitting: Cardiovascular Disease

## 2014-11-01 DIAGNOSIS — E119 Type 2 diabetes mellitus without complications: Secondary | ICD-10-CM | POA: Diagnosis not present

## 2014-11-01 DIAGNOSIS — I739 Peripheral vascular disease, unspecified: Secondary | ICD-10-CM | POA: Diagnosis not present

## 2014-11-01 DIAGNOSIS — I10 Essential (primary) hypertension: Secondary | ICD-10-CM | POA: Insufficient documentation

## 2014-11-02 ENCOUNTER — Ambulatory Visit
Admission: RE | Admit: 2014-11-02 | Discharge: 2014-11-02 | Disposition: A | Discharge status: Home / Self Care | Payer: Medicare Other | Source: Ambulatory Visit | Attending: Cardiovascular Disease | Admitting: Cardiovascular Disease

## 2014-11-02 ENCOUNTER — Encounter: Payer: Self-pay | Admitting: Cardiovascular Disease

## 2014-11-02 ENCOUNTER — Ambulatory Visit (INDEPENDENT_AMBULATORY_CARE_PROVIDER_SITE_OTHER): Payer: Medicare Other | Admitting: Cardiovascular Disease

## 2014-11-02 VITALS — BP 128/80 | HR 116 | Ht 63.0 in | Wt 136.7 lb

## 2014-11-02 DIAGNOSIS — I998 Other disorder of circulatory system: Secondary | ICD-10-CM

## 2014-11-02 DIAGNOSIS — Z01818 Encounter for other preprocedural examination: Secondary | ICD-10-CM

## 2014-11-02 DIAGNOSIS — D689 Coagulation defect, unspecified: Secondary | ICD-10-CM | POA: Diagnosis not present

## 2014-11-02 DIAGNOSIS — I739 Peripheral vascular disease, unspecified: Secondary | ICD-10-CM

## 2014-11-02 DIAGNOSIS — I10 Essential (primary) hypertension: Secondary | ICD-10-CM

## 2014-11-02 DIAGNOSIS — R5383 Other fatigue: Secondary | ICD-10-CM | POA: Diagnosis not present

## 2014-11-02 DIAGNOSIS — R079 Chest pain, unspecified: Secondary | ICD-10-CM

## 2014-11-02 DIAGNOSIS — I70229 Atherosclerosis of native arteries of extremities with rest pain, unspecified extremity: Secondary | ICD-10-CM

## 2014-11-02 DIAGNOSIS — I999 Unspecified disorder of circulatory system: Secondary | ICD-10-CM | POA: Diagnosis not present

## 2014-11-02 LAB — BASIC METABOLIC PANEL
BUN: 9 mg/dL (ref 6–23)
CO2: 27 mEq/L (ref 19–32)
Calcium: 10.3 mg/dL (ref 8.4–10.5)
Chloride: 104 mEq/L (ref 96–112)
Creat: 0.73 mg/dL (ref 0.50–1.10)
Glucose, Bld: 203 mg/dL — ABNORMAL HIGH (ref 70–99)
Potassium: 4.4 mEq/L (ref 3.5–5.3)
Sodium: 142 mEq/L (ref 135–145)

## 2014-11-02 LAB — CBC
HCT: 43.9 % (ref 36.0–46.0)
Hemoglobin: 14.5 g/dL (ref 12.0–15.0)
MCH: 27.5 pg (ref 26.0–34.0)
MCHC: 33 g/dL (ref 30.0–36.0)
MCV: 83.3 fL (ref 78.0–100.0)
MPV: 9.9 fL (ref 8.6–12.4)
Platelets: 263 10*3/uL (ref 150–400)
RBC: 5.27 MIL/uL — ABNORMAL HIGH (ref 3.87–5.11)
RDW: 13.8 % (ref 11.5–15.5)
WBC: 5.1 10*3/uL (ref 4.0–10.5)

## 2014-11-02 LAB — PROTIME-INR
INR: 1.01 (ref <1.50)
Prothrombin Time: 13.3 seconds (ref 11.6–15.2)

## 2014-11-02 LAB — TSH: TSH: 1.632 u[IU]/mL (ref 0.350–4.500)

## 2014-11-02 LAB — APTT: aPTT: 36 seconds (ref 24–37)

## 2014-11-02 NOTE — Assessment & Plan Note (Signed)
History of hypertension with blood pressure measured at 120/80.  She is on olmesartan, amlodipine and hydrochlorothiazide. Continue current meds at current dosing

## 2014-11-02 NOTE — Patient Instructions (Signed)
Dr. Gwenlyn Found has ordered a peripheral angiogram to be done at [facility] tomorrow.  This procedure is going to look at the bloodflow in your lower extremities.  If Dr. Gwenlyn Found is able to open up the arteries, you will have to spend one night in the hospital.  If he is not able to open the arteries, you will be able to go home that same day.   After the procedure, you will not be allowed to drive for 3 days or push, pull, or lift anything greater than 10 lbs for one week.    You will be required to have the following tests prior to the procedure: 1. Blood work-the blood work can be done no more than 7 days prior to the procedure.  It can be done at the [facility] lab downstairs.  2. Chest Xray-the chest xray order has already been placed at [facility] in the [place].     *REPS Scott   right groin access

## 2014-11-02 NOTE — Assessment & Plan Note (Signed)
[name] was referred to me by Dr. Melony Overly for critical limb ischemia. She has risk factors including family history, tobacco abuse, hypertension, hyperlipidemia and diabetes.she has had left foot pain since April with what appears to be an ischemic left fourth toe since June 2. She had Dopplers performed in our office yesterday which showed a right ABI of 1 with a moderately high-frequency signal in the right common iliac artery and left ABI 0.6 with an occluded mid left SFA and one-vessel runoff. She will need  Angiography and intervention for critical limb ischemia. I fully explained the procedure, risks and benefits and the patient wishes to proceed. I will schedule this for tomorrow.

## 2014-11-02 NOTE — Progress Notes (Signed)
11/02/2014 [name]   09/20/1955  [id number]  Primary Physician No PCP Per Patient Primary Cardiologist: Lorretta Harp MD Renae Gloss   HPI:  [name] is a very pleasant 59 year old thin-appearing divorced African-American female mother of one, grandmother of one grandchild who is currently disabled because of a prior stroke. She was referred by Dr. Melony Overly, from [facility] podiatry, for evaluation and treatment of critical limb ischemia. Her cardiovascular risk factor profile is notable for a strong family history of heart disease with the father about a stent, mother who had bypass surgery and a sister who died at age 35 of a myocardial infarction. She has never had a heart attack but apparently has had a stroke in the past with some mild left-sided residua. She has a history of tobacco abuse in the last 43 years of one third pack per day trying to quit currently. She has treated diabetes, hypertension and hyperlipidemia. She had the onset of left ear pain approximately  3 months ago with progression to critical limb ischemia in early June with ischemic appearing left fourth toe. Dopplers in our office performed yesterday revealed a left ABI 0.6 with an occluded left SFA and one-vessel runoff. She will need angiography and potential endovascular therapy for critical limb ischemia.   Current Outpatient Prescriptions  Medication Sig Dispense Refill  . albuterol (PROAIR HFA) 108 (90 BASE) MCG/ACT inhaler Inhale 2 puffs into the lungs every 6 (six) hours as needed for wheezing or shortness of breath.    . ALPRAZolam (XANAX) 0.5 MG tablet Take 0.25-0.5 mg by mouth 2 (two) times daily as needed for anxiety.     Marland Kitchen b complex vitamins tablet Take 1 tablet by mouth daily.    . Calcium Carb-Cholecalciferol (CALCIUM 600 + D PO) Take 1 tablet by mouth daily.    . celecoxib (CELEBREX) 200 MG capsule Take 200 mg by mouth daily as needed for mild pain.   1  . clopidogrel (PLAVIX) 75  MG tablet Take 75 mg by mouth at bedtime.     . diazepam (VALIUM) 5 MG tablet Take 5 mg by mouth every 6 (six) hours as needed for muscle spasms.   0  . esomeprazole (NEXIUM) 40 MG capsule Take 40 mg by mouth 2 (two) times daily.     Marland Kitchen ibuprofen (ADVIL,MOTRIN) 800 MG tablet Take 800 mg by mouth 2 (two) times daily as needed for headache or moderate pain.    Marland Kitchen insulin lispro protamine-lispro (HUMALOG 75/25 MIX) (75-25) 100 UNIT/ML SUSP injection Inject 22-35 Units into the skin 2 (two) times daily with a meal. Sliding scale:  Morning dose (after breaksfast):  CBG 110-200 22 units, 201-275 28 units, 276-350 35 units; evening dose (after supper) 15 units unless CBG is >110, then use morning sliding scale.    . loratadine (CLARITIN) 10 MG tablet Take 10 mg by mouth daily as needed for allergies.    . mometasone (NASONEX) 50 MCG/ACT nasal spray Place 1 spray into the nose daily as needed (allergies).     . mupirocin cream (BACTROBAN) 2 % Apply 1 application topically 2 (two) times daily as needed (wound care (boils)).     . nitroGLYCERIN (NITRODUR - DOSED IN MG/24 HR) 0.4 mg/hr Place 0.4 mg onto the skin as needed (chest pain).     . nitroGLYCERIN (NITROSTAT) 0.4 MG SL tablet Place 0.4 mg under the tongue every 5 (five) minutes as needed for chest pain. Maximum 3 doses    .  Olmesartan-Amlodipine-HCTZ (TRIBENZOR) 40-5-12.5 MG TABS Take 1 tablet by mouth daily.     . ONE TOUCH ULTRA TEST test strip     . potassium chloride SA (K-DUR,KLOR-CON) 20 MEQ tablet Take 20 mEq by mouth daily as needed (for cramps).    . rosuvastatin (CRESTOR) 20 MG tablet Take 20 mg by mouth at bedtime.     . SUMAtriptan (IMITREX) 50 MG tablet Take 50 mg by mouth every 2 (two) hours as needed for migraine.    Marland Kitchen tiZANidine (ZANAFLEX) 4 MG tablet Take 1 tablet (4 mg total) by mouth every 6 (six) hours as needed for muscle spasms. 60 tablet 0  . traMADol (ULTRAM) 50 MG tablet Take 1 tablet (50 mg total) by mouth every 6 (six) hours as  needed. 20 tablet 0   No current facility-administered medications for this visit.    Allergies  Allergen Reactions  . Bactrim [Sulfamethoxazole-Trimethoprim] Shortness Of Breath  . Sulfa Antibiotics Shortness Of Breath and Palpitations  . Fish Allergy Hives and Swelling    Tongue swelling  . Iodine Swelling  . Metformin And Related Diarrhea  . Adhesive [Tape] Rash    Paper tape is ok  . Shellfish Allergy Swelling and Rash    Tongue swelling    History   Social History  . Marital Status: Divorced    Spouse Name: N/A  . Number of Children: 1  . Years of Education: N/A   Occupational History  .     Social History Main Topics  . Smoking status: Current Every Day Smoker -- 0.33 packs/day for 41 years    Types: Cigarettes    Last Attempt to Quit: 07/25/2013  . Smokeless tobacco: Never Used  . Alcohol Use: No  . Drug Use: No     Comment: 09/14/2012  "quit 07/27/1994"  . Sexual Activity: Not Currently    Birth Control/ Protection: Abstinence   Other Topics Concern  . Not on file   Social History Narrative     Review of Systems: General: negative for chills, fever, night sweats or weight changes.  Cardiovascular: negative for chest pain, dyspnea on exertion, edema, orthopnea, palpitations, paroxysmal nocturnal dyspnea or shortness of breath Dermatological: negative for rash Respiratory: negative for cough or wheezing Urologic: negative for hematuria Abdominal: negative for nausea, vomiting, diarrhea, bright red blood per rectum, melena, or hematemesis Neurologic: negative for visual changes, syncope, or dizziness All other systems reviewed and are otherwise negative except as noted above.    Blood pressure 128/80, pulse 116, height 5\' 3"  (1.6 m), weight 136 lb 11.2 oz (62.007 kg).  General appearance: alert and no distress Neck: no adenopathy, no carotid bruit, no JVD, supple, symmetrical, trachea midline and thyroid not enlarged, symmetric, no  tenderness/mass/nodules Lungs: clear to auscultation bilaterally Heart: regular rate and rhythm, S1, S2 normal, no murmur, click, rub or gallop Extremities: extremities normal, atraumatic, no cyanosis or edema and ischemic appearing left fourth toe  EKG not performed today  ASSESSMENT AND PLAN:   Peripheral arterial disease Ms. Wall was referred to me by Dr. Melony Overly for critical limb ischemia. She has risk factors including family history, tobacco abuse, hypertension, hyperlipidemia and diabetes.she has had left foot pain since April with what appears to be an ischemic left fourth toe since June 2. She had Dopplers performed in our office yesterday which showed a right ABI of 1 with a moderately high-frequency signal in the right common iliac artery and left ABI 0.6 with an occluded mid left SFA  and one-vessel runoff. She will need  Angiography and intervention for critical limb ischemia. I fully explained the procedure, risks and benefits and the patient wishes to proceed. I will schedule this for tomorrow.  Essential hypertension History of hypertension with blood pressure measured at 120/80.  She is on olmesartan, amlodipine and hydrochlorothiazide. 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

## 2014-11-02 NOTE — Assessment & Plan Note (Signed)
History of hyperlipidemia on Crestor 20 mg a day followed by her PCP 

## 2014-11-02 NOTE — Assessment & Plan Note (Signed)
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

## 2014-11-03 ENCOUNTER — Ambulatory Visit (HOSPITAL_COMMUNITY)
Admission: RE | Admit: 2014-11-03 | Discharge: 2014-11-04 | Disposition: A | Discharge status: Home / Self Care | Payer: Medicare Other | Source: Ambulatory Visit | Attending: Cardiovascular Disease | Admitting: Cardiovascular Disease

## 2014-11-03 ENCOUNTER — Encounter (HOSPITAL_COMMUNITY)
Admission: RE | Disposition: A | Discharge status: Home / Self Care | Payer: Medicare Other | Source: Ambulatory Visit | Attending: Cardiovascular Disease

## 2014-11-03 ENCOUNTER — Ambulatory Visit: Payer: Medicare Other | Admitting: Neurology

## 2014-11-03 ENCOUNTER — Encounter (HOSPITAL_COMMUNITY): Payer: Self-pay | Admitting: *Deleted

## 2014-11-03 DIAGNOSIS — I739 Peripheral vascular disease, unspecified: Secondary | ICD-10-CM | POA: Diagnosis not present

## 2014-11-03 DIAGNOSIS — I998 Other disorder of circulatory system: Secondary | ICD-10-CM | POA: Diagnosis present

## 2014-11-03 DIAGNOSIS — I7092 Chronic total occlusion of artery of the extremities: Secondary | ICD-10-CM | POA: Insufficient documentation

## 2014-11-03 DIAGNOSIS — E119 Type 2 diabetes mellitus without complications: Secondary | ICD-10-CM | POA: Diagnosis not present

## 2014-11-03 DIAGNOSIS — Z87891 Personal history of nicotine dependence: Secondary | ICD-10-CM | POA: Diagnosis not present

## 2014-11-03 DIAGNOSIS — I10 Essential (primary) hypertension: Secondary | ICD-10-CM | POA: Diagnosis present

## 2014-11-03 DIAGNOSIS — Z8673 Personal history of transient ischemic attack (TIA), and cerebral infarction without residual deficits: Secondary | ICD-10-CM | POA: Insufficient documentation

## 2014-11-03 DIAGNOSIS — I70229 Atherosclerosis of native arteries of extremities with rest pain, unspecified extremity: Secondary | ICD-10-CM | POA: Diagnosis present

## 2014-11-03 DIAGNOSIS — Z95828 Presence of other vascular implants and grafts: Secondary | ICD-10-CM

## 2014-11-03 DIAGNOSIS — E785 Hyperlipidemia, unspecified: Secondary | ICD-10-CM | POA: Diagnosis present

## 2014-11-03 DIAGNOSIS — I70212 Atherosclerosis of native arteries of extremities with intermittent claudication, left leg: Secondary | ICD-10-CM | POA: Diagnosis not present

## 2014-11-03 DIAGNOSIS — I70213 Atherosclerosis of native arteries of extremities with intermittent claudication, bilateral legs: Secondary | ICD-10-CM | POA: Diagnosis not present

## 2014-11-03 HISTORY — PX: PERIPHERAL VASCULAR CATHETERIZATION: SHX172C

## 2014-11-03 HISTORY — DX: Presence of cardiac and vascular implant and graft, unspecified: Z95.9

## 2014-11-03 LAB — GLUCOSE, CAPILLARY
Glucose-Capillary: 168 mg/dL — ABNORMAL HIGH (ref 65–99)
Glucose-Capillary: 201 mg/dL — ABNORMAL HIGH (ref 65–99)
Glucose-Capillary: 244 mg/dL — ABNORMAL HIGH (ref 65–99)

## 2014-11-03 LAB — POCT ACTIVATED CLOTTING TIME
Activated Clotting Time: 227 seconds
Activated Clotting Time: 227 seconds
Activated Clotting Time: 251 seconds
Activated Clotting Time: 190 seconds
Activated Clotting Time: 134 seconds

## 2014-11-03 SURGERY — LOWER EXTREMITY ANGIOGRAPHY

## 2014-11-03 MED ORDER — OLMESARTAN-AMLODIPINE-HCTZ 40-5-12.5 MG PO TABS: 1.0000 | ORAL_TABLET | Freq: Every day | ORAL | Status: DC

## 2014-11-03 MED ORDER — SODIUM CHLORIDE 0.9 % WEIGHT BASED INFUSION: 1.0000 mL/kg/h | INTRAVENOUS | Status: DC

## 2014-11-03 MED ORDER — CLOPIDOGREL BISULFATE 75 MG PO TABS: 75.0000 mg | ORAL_TABLET | Freq: Every day | ORAL | Status: DC

## 2014-11-03 MED ORDER — LIDOCAINE HCL (PF) 1 % IJ SOLN
INTRAMUSCULAR | Status: AC
  Filled 2014-11-03: qty 30

## 2014-11-03 MED ORDER — ACETAMINOPHEN 325 MG PO TABS: 650.0000 mg | ORAL_TABLET | ORAL | Status: DC | PRN

## 2014-11-03 MED ORDER — IODIXANOL 320 MG/ML IV SOLN
INTRAVENOUS | Status: DC | PRN
  Administered 2014-11-03: 190 mL via INTRA_ARTERIAL

## 2014-11-03 MED ORDER — FENTANYL CITRATE (PF) 100 MCG/2ML IJ SOLN
INTRAMUSCULAR | Status: DC | PRN
  Administered 2014-11-03: 25 ug via INTRAVENOUS

## 2014-11-03 MED ORDER — FAMOTIDINE IN NACL 20-0.9 MG/50ML-% IV SOLN
INTRAVENOUS | Status: AC
Start: 2014-11-03 — End: 2014-11-03
  Administered 2014-11-03: 20 mg via INTRAVENOUS
  Filled 2014-11-03: qty 50

## 2014-11-03 MED ORDER — CLOPIDOGREL BISULFATE 75 MG PO TABS
75.0000 mg | ORAL_TABLET | Freq: Every day | ORAL | Status: DC
  Administered 2014-11-03: 75 mg via ORAL
  Filled 2014-11-03: qty 1

## 2014-11-03 MED ORDER — NITROGLYCERIN 0.4 MG SL SUBL: 0.4000 mg | SUBLINGUAL_TABLET | SUBLINGUAL | Status: DC | PRN

## 2014-11-03 MED ORDER — IRBESARTAN 300 MG PO TABS: 300.0000 mg | ORAL_TABLET | Freq: Every day | ORAL | Status: DC

## 2014-11-03 MED ORDER — DIPHENHYDRAMINE HCL 50 MG/ML IJ SOLN
25.0000 mg | Freq: Once | INTRAMUSCULAR | Status: AC
  Administered 2014-11-03: 25 mg via INTRAVENOUS

## 2014-11-03 MED ORDER — HEPARIN (PORCINE) IN NACL 2-0.9 UNIT/ML-% IJ SOLN
INTRAMUSCULAR | Status: AC
  Filled 2014-11-03: qty 1000

## 2014-11-03 MED ORDER — SODIUM CHLORIDE 0.9 % IJ SOLN: 3.0000 mL | INTRAMUSCULAR | Status: DC | PRN

## 2014-11-03 MED ORDER — MORPHINE SULFATE 2 MG/ML IJ SOLN
INTRAMUSCULAR | Status: AC
  Filled 2014-11-03: qty 1

## 2014-11-03 MED ORDER — TRAMADOL HCL 50 MG PO TABS
50.0000 mg | ORAL_TABLET | Freq: Four times a day (QID) | ORAL | Status: DC | PRN
  Administered 2014-11-03 – 2014-11-04 (×2): 50 mg via ORAL
  Filled 2014-11-03 (×3): qty 1

## 2014-11-03 MED ORDER — METHYLPREDNISOLONE SODIUM SUCC 125 MG IJ SOLR
INTRAMUSCULAR | Status: AC
  Administered 2014-11-03: 125 mg via INTRAVENOUS
  Filled 2014-11-03: qty 2

## 2014-11-03 MED ORDER — SUMATRIPTAN SUCCINATE 50 MG PO TABS
50.0000 mg | ORAL_TABLET | ORAL | Status: DC | PRN
  Filled 2014-11-03: qty 1

## 2014-11-03 MED ORDER — HEPARIN SODIUM (PORCINE) 1000 UNIT/ML IJ SOLN
INTRAMUSCULAR | Status: AC
  Filled 2014-11-03: qty 1

## 2014-11-03 MED ORDER — FLUTICASONE PROPIONATE 50 MCG/ACT NA SUSP
1.0000 | Freq: Every day | NASAL | Status: DC
  Administered 2014-11-03 – 2014-11-04 (×2): 1 via NASAL
  Filled 2014-11-03: qty 16

## 2014-11-03 MED ORDER — ASPIRIN 81 MG PO CHEW
81.0000 mg | CHEWABLE_TABLET | ORAL | Status: AC
  Administered 2014-11-03: 81 mg via ORAL

## 2014-11-03 MED ORDER — ALPRAZOLAM 0.25 MG PO TABS
0.2500 mg | ORAL_TABLET | Freq: Two times a day (BID) | ORAL | Status: DC | PRN
  Administered 2014-11-03: 0.25 mg via ORAL
  Filled 2014-11-03: qty 1

## 2014-11-03 MED ORDER — PANTOPRAZOLE SODIUM 40 MG PO TBEC
40.0000 mg | DELAYED_RELEASE_TABLET | Freq: Every day | ORAL | Status: DC
  Administered 2014-11-03 – 2014-11-04 (×2): 40 mg via ORAL
  Filled 2014-11-03 (×2): qty 1

## 2014-11-03 MED ORDER — AMLODIPINE BESYLATE 5 MG PO TABS
5.0000 mg | ORAL_TABLET | Freq: Every day | ORAL | Status: DC
  Administered 2014-11-04: 5 mg via ORAL
  Filled 2014-11-03: qty 1

## 2014-11-03 MED ORDER — POTASSIUM CHLORIDE CRYS ER 20 MEQ PO TBCR
20.0000 meq | EXTENDED_RELEASE_TABLET | Freq: Every day | ORAL | Status: DC | PRN
  Administered 2014-11-04: 11:00:00 20 meq via ORAL
  Filled 2014-11-03: qty 1

## 2014-11-03 MED ORDER — ASPIRIN 81 MG PO CHEW
CHEWABLE_TABLET | ORAL | Status: AC
  Administered 2014-11-03: 81 mg via ORAL
  Filled 2014-11-03: qty 1

## 2014-11-03 MED ORDER — FENTANYL CITRATE (PF) 100 MCG/2ML IJ SOLN
INTRAMUSCULAR | Status: AC
  Filled 2014-11-03: qty 2

## 2014-11-03 MED ORDER — ROSUVASTATIN CALCIUM 20 MG PO TABS
20.0000 mg | ORAL_TABLET | Freq: Every day | ORAL | Status: DC
  Administered 2014-11-03: 20 mg via ORAL
  Filled 2014-11-03: qty 1

## 2014-11-03 MED ORDER — METHYLPREDNISOLONE SODIUM SUCC 125 MG IJ SOLR
125.0000 mg | Freq: Once | INTRAMUSCULAR | Status: AC
  Administered 2014-11-03: 125 mg via INTRAVENOUS

## 2014-11-03 MED ORDER — IRBESARTAN 300 MG PO TABS
300.0000 mg | ORAL_TABLET | Freq: Every day | ORAL | Status: DC
  Administered 2014-11-04: 300 mg via ORAL
  Filled 2014-11-03: qty 1

## 2014-11-03 MED ORDER — ASPIRIN EC 325 MG PO TBEC
325.0000 mg | DELAYED_RELEASE_TABLET | Freq: Every day | ORAL | Status: DC
  Administered 2014-11-04: 11:00:00 325 mg via ORAL
  Filled 2014-11-03: qty 1

## 2014-11-03 MED ORDER — SODIUM CHLORIDE 0.9 % WEIGHT BASED INFUSION
3.0000 mL/kg/h | INTRAVENOUS | Status: DC
  Administered 2014-11-03: 3 mL/kg/h via INTRAVENOUS

## 2014-11-03 MED ORDER — AMLODIPINE BESYLATE 5 MG PO TABS: 12.5000 mg | ORAL_TABLET | Freq: Every day | ORAL | Status: DC

## 2014-11-03 MED ORDER — NITROGLYCERIN 0.4 MG/HR TD PT24
0.4000 mg | MEDICATED_PATCH | TRANSDERMAL | Status: DC | PRN
  Filled 2014-11-03: qty 1

## 2014-11-03 MED ORDER — SODIUM CHLORIDE 0.9 % IV SOLN
INTRAVENOUS | Status: DC
  Administered 2014-11-03: 20:00:00 via INTRAVENOUS

## 2014-11-03 MED ORDER — LIDOCAINE HCL (PF) 1 % IJ SOLN
INTRAMUSCULAR | Status: DC | PRN
  Administered 2014-11-03: 30 mL via INTRADERMAL

## 2014-11-03 MED ORDER — INSULIN ASPART PROT & ASPART (70-30 MIX) 100 UNIT/ML ~~LOC~~ SUSP
15.0000 [IU] | Freq: Two times a day (BID) | SUBCUTANEOUS | Status: DC
  Administered 2014-11-03: 19:00:00 28 [IU] via SUBCUTANEOUS
  Administered 2014-11-04: 08:00:00 22 [IU] via SUBCUTANEOUS
  Filled 2014-11-03: qty 10

## 2014-11-03 MED ORDER — FAMOTIDINE IN NACL 20-0.9 MG/50ML-% IV SOLN
20.0000 mg | Freq: Once | INTRAVENOUS | Status: AC
  Administered 2014-11-03: 20 mg via INTRAVENOUS

## 2014-11-03 MED ORDER — ALBUTEROL SULFATE (2.5 MG/3ML) 0.083% IN NEBU: 3.0000 mL | INHALATION_SOLUTION | Freq: Four times a day (QID) | RESPIRATORY_TRACT | Status: DC | PRN

## 2014-11-03 MED ORDER — MIDAZOLAM HCL 2 MG/2ML IJ SOLN
INTRAMUSCULAR | Status: AC
  Filled 2014-11-03: qty 2

## 2014-11-03 MED ORDER — MORPHINE SULFATE 2 MG/ML IJ SOLN
2.0000 mg | INTRAMUSCULAR | Status: DC | PRN
  Administered 2014-11-03: 2 mg via INTRAVENOUS
  Administered 2014-11-03: 18:00:00 1 mg via INTRAVENOUS
  Administered 2014-11-04 (×2): 2 mg via INTRAVENOUS
  Filled 2014-11-03 (×3): qty 1

## 2014-11-03 MED ORDER — HEPARIN SODIUM (PORCINE) 1000 UNIT/ML IJ SOLN
INTRAMUSCULAR | Status: DC | PRN
  Administered 2014-11-03: 2500 [IU] via INTRAVENOUS
  Administered 2014-11-03: 5000 [IU] via INTRAVENOUS

## 2014-11-03 MED ORDER — MIDAZOLAM HCL 2 MG/2ML IJ SOLN
INTRAMUSCULAR | Status: DC | PRN
  Administered 2014-11-03: 1 mg via INTRAVENOUS

## 2014-11-03 MED ORDER — HYDROCHLOROTHIAZIDE 12.5 MG PO CAPS
12.5000 mg | ORAL_CAPSULE | Freq: Every day | ORAL | Status: DC
  Administered 2014-11-04: 12.5 mg via ORAL
  Filled 2014-11-03: qty 1

## 2014-11-03 MED ORDER — DIPHENHYDRAMINE HCL 50 MG/ML IJ SOLN
INTRAMUSCULAR | Status: AC
  Administered 2014-11-03: 25 mg via INTRAVENOUS
  Filled 2014-11-03: qty 1

## 2014-11-03 MED ORDER — ONDANSETRON HCL 4 MG/2ML IJ SOLN: 4.0000 mg | Freq: Four times a day (QID) | INTRAMUSCULAR | Status: DC | PRN

## 2014-11-03 SURGICAL SUPPLY — 26 items
BALLN ARMADA 35LL 5X150X135 (BALLOONS) ×4
BALLOON ARMADA 2.0X120X150 (BALLOONS) ×4 IMPLANT
BALLOON ARMADA 35LL 5X150X135 (BALLOONS) ×3 IMPLANT
CATH ANGIO 5F PIGTAIL 65CM (CATHETERS) ×4 IMPLANT
CATH CROSS OVER TEMPO 5F (CATHETERS) ×4 IMPLANT
CATH HAWKONE LS STANDARD TIP (CATHETERS) ×4
CATH HAWKONE LS STD TIP (CATHETERS) ×3 IMPLANT
CATH VIANCE CROSS STAND 150CM (MICROCATHETER) ×4
CATH VIANCE CROSS STD 150CM (MICROCATHETER) ×3 IMPLANT
DEVICE SPIDERFX EMB PROT 5MM (WIRE) ×4 IMPLANT
GUIDEWIRE ASTATO XS 20G 300CM (WIRE) ×4 IMPLANT
KIT ENCORE 26 ADVANTAGE (KITS) ×4 IMPLANT
KIT PV (KITS) ×4 IMPLANT
SET AVANTA SINGLE PATIENT (MISCELLANEOUS) ×4 IMPLANT
SHEATH AVANTA HAND CONTROLLER (MISCELLANEOUS) ×4 IMPLANT
SHEATH FLEXOR ANSEL 1 7F 45CM (SHEATH) ×4 IMPLANT
SHEATH HIGHFLEX ANSEL 7FR 55CM (SHEATH) ×4 IMPLANT
SHEATH PINNACLE 5F 10CM (SHEATH) ×4 IMPLANT
SHEATH PINNACLE 7F 10CM (SHEATH) ×4 IMPLANT
STENT VIABAHN 6X150X120 (Permanent Stent) ×4 IMPLANT
TAPE RADIOPAQUE TURBO (MISCELLANEOUS) ×8 IMPLANT
TRANSDUCER W/STOPCOCK (MISCELLANEOUS) ×4 IMPLANT
TRAY PV CATH (CUSTOM PROCEDURE TRAY) ×4 IMPLANT
TUBING CIL FLEX 10 FLL-RA (TUBING) ×4 IMPLANT
WIRE HITORQ VERSACORE ST 145CM (WIRE) ×4 IMPLANT
WIRE SPARTACORE .014X300CM (WIRE) ×4 IMPLANT

## 2014-11-03 NOTE — Progress Notes (Signed)
Site area: right groin  Site Prior to Removal:  Level 0  Pressure Applied For 20 MINUTES    Minutes Beginning at 1825  Manual:   Yes.    Patient Status During Pull:  stable  Post Pull Groin Site:  Level 0  Post Pull Instructions Given:  Yes.    Post Pull Pulses Present:  Yes.    Dressing Applied:  Yes.    Comments:  tol sheath pull well.

## 2014-11-04 ENCOUNTER — Other Ambulatory Visit: Payer: Self-pay | Admitting: Cardiology

## 2014-11-04 ENCOUNTER — Encounter (HOSPITAL_COMMUNITY): Payer: Self-pay | Admitting: Cardiology

## 2014-11-04 DIAGNOSIS — I70229 Atherosclerosis of native arteries of extremities with rest pain, unspecified extremity: Secondary | ICD-10-CM

## 2014-11-04 DIAGNOSIS — Z959 Presence of cardiac and vascular implant and graft, unspecified: Secondary | ICD-10-CM

## 2014-11-04 DIAGNOSIS — Z8673 Personal history of transient ischemic attack (TIA), and cerebral infarction without residual deficits: Secondary | ICD-10-CM | POA: Diagnosis not present

## 2014-11-04 DIAGNOSIS — E785 Hyperlipidemia, unspecified: Secondary | ICD-10-CM | POA: Diagnosis not present

## 2014-11-04 DIAGNOSIS — I998 Other disorder of circulatory system: Secondary | ICD-10-CM | POA: Diagnosis not present

## 2014-11-04 DIAGNOSIS — I10 Essential (primary) hypertension: Secondary | ICD-10-CM | POA: Diagnosis not present

## 2014-11-04 DIAGNOSIS — I739 Peripheral vascular disease, unspecified: Secondary | ICD-10-CM

## 2014-11-04 DIAGNOSIS — I70212 Atherosclerosis of native arteries of extremities with intermittent claudication, left leg: Secondary | ICD-10-CM | POA: Diagnosis not present

## 2014-11-04 DIAGNOSIS — E119 Type 2 diabetes mellitus without complications: Secondary | ICD-10-CM | POA: Diagnosis not present

## 2014-11-04 DIAGNOSIS — I7092 Chronic total occlusion of artery of the extremities: Secondary | ICD-10-CM | POA: Diagnosis not present

## 2014-11-04 DIAGNOSIS — Z95828 Presence of other vascular implants and grafts: Secondary | ICD-10-CM

## 2014-11-04 DIAGNOSIS — Z87891 Personal history of nicotine dependence: Secondary | ICD-10-CM | POA: Diagnosis not present

## 2014-11-04 HISTORY — DX: Presence of cardiac and vascular implant and graft, unspecified: Z95.9

## 2014-11-04 LAB — BASIC METABOLIC PANEL
Anion gap: 10 (ref 5–15)
BUN: 12 mg/dL (ref 6–20)
CO2: 25 mmol/L (ref 22–32)
Calcium: 9.5 mg/dL (ref 8.9–10.3)
Chloride: 105 mmol/L (ref 101–111)
Creatinine, Ser: 0.82 mg/dL (ref 0.44–1.00)
GFR calc Af Amer: >60 mL/min (ref >60)
GFR calc non Af Amer: >60 mL/min (ref >60)
Glucose, Bld: 207 mg/dL — ABNORMAL HIGH (ref 65–99)
Potassium: 3.9 mmol/L (ref 3.5–5.1)
Sodium: 140 mmol/L (ref 135–145)

## 2014-11-04 LAB — CBC
HCT: 35.9 % — ABNORMAL LOW (ref 36.0–46.0)
Hemoglobin: 12.2 g/dL (ref 12.0–15.0)
MCH: 27.4 pg (ref 26.0–34.0)
MCHC: 34 g/dL (ref 30.0–36.0)
MCV: 80.5 fL (ref 78.0–100.0)
Platelets: 182 10*3/uL (ref 150–400)
RBC: 4.46 MIL/uL (ref 3.87–5.11)
RDW: 13.2 % (ref 11.5–15.5)
WBC: 4.7 10*3/uL (ref 4.0–10.5)

## 2014-11-04 LAB — GLUCOSE, CAPILLARY
Glucose-Capillary: 200 mg/dL — ABNORMAL HIGH (ref 65–99)
Glucose-Capillary: 111 mg/dL — ABNORMAL HIGH (ref 65–99)

## 2014-11-04 MED ORDER — PANTOPRAZOLE SODIUM 40 MG PO TBEC: 40.0000 mg | DELAYED_RELEASE_TABLET | Freq: Every day | ORAL | Status: DC

## 2014-11-04 MED ORDER — ACETAMINOPHEN 325 MG PO TABS: 650.0000 mg | ORAL_TABLET | ORAL | Status: DC | PRN

## 2014-11-04 MED ORDER — ASPIRIN 325 MG PO TBEC: 325.0000 mg | DELAYED_RELEASE_TABLET | Freq: Every day | ORAL | Status: DC

## 2014-11-04 MED FILL — Heparin Sodium (Porcine) 2 Unit/ML in Sodium Chloride 0.9%: INTRAMUSCULAR | Qty: 1000 | Status: AC

## 2014-11-04 NOTE — Discharge Instructions (Signed)
Call [facility] at [phone] if any bleeding, swelling or drainage at cath site.  May shower, no tub baths for 48 hours for groin sticks. No lifting over 5 pounds for 3 days.  No Driving for 3 days.    Heart healthy diabetic diet  Continue plavix

## 2014-11-04 NOTE — Discharge Summary (Signed)
Physician Discharge Summary       Patient ID: [name] MRN: [id number] DOB/AGE: 06-23-1955 59 y.o.  Admit date: 11/03/2014 Discharge date: 11/04/2014 Primary Cardiologist:Dr. Gwenlyn Found   Discharge Diagnoses:  Principal Problem:   Critical lower limb ischemia Active Problems:   S/P arterial stent-mid Lt SFA 11/03/14   DM2 (diabetes mellitus, type 2)   Hyperlipidemia LDL goal <70   Essential hypertension   Peripheral arterial disease   Discharged Condition: good  Procedures: 11/03/14 PV arteriogram by Dr. Gwenlyn Found and successful PTCA, [facility] 1 direction atherectomy and Viabahn covered stenting using distal protection of a chronic total occlusion mid left SFA in the setting of critical limb ischemia  Hospital Course:  59 year old thin-appearing divorced African-American female who is currently disabled because of a prior stroke. She was referred by Dr. Melony Overly, from [facility] podiatry, for evaluation and treatment of critical limb ischemia. Her cardiovascular risk factor profile is notable for a strong family history of heart disease with the father about a stent, mother who had bypass surgery and a sister who died at age 53 of a myocardial infarction. She has never had a heart attack but apparently has had a stroke in the past with some mild left-sided residual. She has a history of tobacco abuse in the last 43 years of one third pack per day trying to quit currently. She has treated diabetes, hypertension and hyperlipidemia. She had the onset of left leg pain approximately 3 months ago with progression to critical limb ischemia in early June with ischemic appearing left fourth toe. Dopplers in our office revealed a left ABI 0.6 with an occluded left SFA and one-vessel runoff. She was admitted for angiography and potential endovascular therapy for critical limb ischemia. PV angio:  With an occluded left SFA with critical limb ischemia. Dr. Adora Fridge then proceeded with successful PTCA, [facility] 1  direction atherectomy and Viabahn covered stenting using distal protection of a chronic total occlusion mid left SFA in the setting of critical limb ischemia.  By 11/04/14 [name] was stable without complications and ready for discharge.  She was seen and evaluated by Dr. Adora Fridge.  She will have follow up dopplers in Lt leg then follow up with APP.   Consults: None  Significant Diagnostic Studies:  BMP Latest Ref Rng 11/04/2014 11/02/2014 09/14/2014  Glucose 65 - 99 mg/dL 207(H) 203(H) 164(H)  BUN 6 - 20 mg/dL 12 9 8   Creatinine 0.44 - 1.00 mg/dL 0.82 0.73 0.60  Sodium 135 - 145 mmol/L 140 142 141  Potassium 3.5 - 5.1 mmol/L 3.9 4.4 3.6  Chloride 101 - 111 mmol/L 105 104 104  CO2 22 - 32 mmol/L 25 27 -  Calcium 8.9 - 10.3 mg/dL 9.5 10.3 -   CBC Latest Ref Rng 11/04/2014 11/02/2014 09/14/2014  WBC 4.0 - 10.5 K/uL 4.7 5.1 -  Hemoglobin 12.0 - 15.0 g/dL 12.2 14.5 13.9  Hematocrit 36.0 - 46.0 % 35.9(L) 43.9 41.0  Platelets 150 - 400 K/uL 182 263 -    PV angio: Angiographic Data:   1: Abdominal aorta-the distal abdominal aorta was free of significant disease  2: Left lower extremity-moderately long segment chronic total occlusion mid left SFA. The distal SFA filled by profunda femoris collaterals and the adductor canal. There was three-vessel runoff with the dominant vessel being the posterior tibial artery.  3: Right lower extremity-50% segmental mid right SFA stenosis with three-vessel runoff  IMPRESSION:Mr. Kiner has an occluded left SFA with critical limb ischemia. We'll proceed with  percutaneous revascularization using directional atherectomy plus or minus drug eluting balloon angioplasty and distal protection. Which was successful.    Discharge Exam: Blood pressure 136/54, pulse 66, temperature 97.8 F (36.6 C), temperature source Oral, resp. rate 14, height 5\' 4"  (1.626 m), weight 146 lb 2.6 oz (66.3 kg), SpO2 96 %.    Disposition: 01-Home or Self Care     Medication List      STOP taking these medications        esomeprazole 40 MG capsule  Commonly known as:  [place] by:  pantoprazole 40 MG tablet     ibuprofen 800 MG tablet  Commonly known as:  ADVIL,MOTRIN      TAKE these medications        acetaminophen 325 MG tablet  Commonly known as:  TYLENOL  Take 2 tablets (650 mg total) by mouth every 4 (four) hours as needed for headache or mild pain.     ALPRAZolam 0.5 MG tablet  Commonly known as:  XANAX  Take 0.25-0.5 mg by mouth 2 (two) times daily as needed for anxiety.     aspirin 325 MG EC tablet  Take 1 tablet (325 mg total) by mouth daily.     b complex vitamins tablet  Take 1 tablet by mouth daily.     CALCIUM 600 + D PO  Take 1 tablet by mouth daily.     celecoxib 200 MG capsule  Commonly known as:  CELEBREX  Take 200 mg by mouth daily as needed for mild pain.     clopidogrel 75 MG tablet  Commonly known as:  PLAVIX  Take 75 mg by mouth at bedtime.     insulin lispro protamine-lispro (75-25) 100 UNIT/ML Susp injection  Commonly known as:  HUMALOG 75/25 MIX  Inject 22-35 Units into the skin 2 (two) times daily with a meal. Sliding scale:  Morning dose (after breaksfast):  CBG 110-200 22 units, 201-275 28 units, 276-350 35 units; evening dose (after supper) 15 units unless CBG is >110, then use morning sliding scale.     loratadine 10 MG tablet  Commonly known as:  CLARITIN  Take 10 mg by mouth daily as needed for allergies.     mometasone 50 MCG/ACT nasal spray  Commonly known as:  NASONEX  Place 1 spray into the nose daily as needed (allergies).     mupirocin cream 2 %  Commonly known as:  BACTROBAN  Apply 1 application topically 2 (two) times daily as needed (wound care (boils)).     nitroGLYCERIN 0.4 mg/hr patch  Commonly known as:  NITRODUR - Dosed in mg/24 hr  Place 0.4 mg onto the skin as needed (chest pain).     nitroGLYCERIN 0.4 MG SL tablet  Commonly known as:  NITROSTAT  Place 0.4 mg under the tongue every  5 (five) minutes as needed for chest pain. Maximum 3 doses     ONE TOUCH ULTRA TEST test strip  Generic drug:  glucose blood     pantoprazole 40 MG tablet  Commonly known as:  PROTONIX  Take 1 tablet (40 mg total) by mouth daily.     potassium chloride SA 20 MEQ tablet  Commonly known as:  K-DUR,KLOR-CON  Take 20 mEq by mouth daily as needed (for cramps).     PROAIR HFA 108 (90 BASE) MCG/ACT inhaler  Generic drug:  albuterol  Inhale 2 puffs into the lungs every 6 (six) hours as needed for wheezing or shortness of breath.  rosuvastatin 20 MG tablet  Commonly known as:  CRESTOR  Take 20 mg by mouth at bedtime.     SUMAtriptan 50 MG tablet  Commonly known as:  IMITREX  Take 50 mg by mouth every 2 (two) hours as needed for migraine.     tiZANidine 4 MG tablet  Commonly known as:  ZANAFLEX  Take 1 tablet (4 mg total) by mouth every 6 (six) hours as needed for muscle spasms.     traMADol 50 MG tablet  Commonly known as:  ULTRAM  Take 1 tablet (50 mg total) by mouth every 6 (six) hours as needed.     TRIBENZOR 40-5-12.5 MG Tabs  Generic drug:  Olmesartan-Amlodipine-HCTZ  Take 1 tablet by mouth daily.       Follow-up Information    Follow up with Quay Burow, MD.   Specialties:  Cardiology, Radiology   Why:  the office will call with date and time   Contact information:   [address] [place] [address] [phone]        Discharge Instructions: Call Benjamin at [phone] if any bleeding, swelling or drainage at cath site.  May shower, no tub baths for 48 hours for groin sticks. No lifting over 5 pounds for 3 days.  No Driving for 3 days.    Heart healthy diabetic diet  Continue plavix  Signed: TGGYIR,SWNIO R Nurse Practitioner-Certified [facility]: [facility] 11/04/2014, 7:43 AM  Time spent on discharge : > 35 minutes.

## 2014-11-04 NOTE — Progress Notes (Signed)
59 year old thin-appearing divorced African-American female who is currently disabled because of a prior stroke. She was referred by Dr. Melony Overly, from [facility] podiatry, for evaluation and treatment of critical limb ischemia. Her cardiovascular risk factor profile is notable for a strong family history of heart disease with the father about a stent, mother who had bypass surgery and a sister who died at age 3 of a myocardial infarction. She has never had a heart attack but apparently has had a stroke in the past with some mild left-sided residual. She has a history of tobacco abuse in the last 43 years of one third pack per day trying to quit currently. She has treated diabetes, hypertension and hyperlipidemia. She had the onset of left leg pain approximately 3 months ago with progression to critical limb ischemia in early June with ischemic appearing left fourth toe. Dopplers in our office  revealed a left ABI 0.6 with an occluded left SFA and one-vessel runoff. She was admitted for angiography and potential endovascular therapy for critical limb ischemia. PV angio: successful PTCA, [facility] 1 direction atherectomy and Viabahn covered stenting using distal protection of a chronic total occlusion mid left SFA in the setting of critical limb ischemia  Subjective: No complaints  Objective: Vital signs in last 24 hours: Temp:  [97.4 F (36.3 C)-97.8 F (36.6 C)] 97.8 F (36.6 C) (07/01 0430) Pulse Rate:  [44-95] 66 (07/01 0430) Resp:  [8-48] 14 (07/01 0430) BP: (114-173)/(54-109) 136/54 mmHg (07/01 0430) SpO2:  [0 %-100 %] 96 % (07/01 0430) Weight:  [136 lb (61.689 kg)-146 lb 2.6 oz (66.3 kg)] 146 lb 2.6 oz (66.3 kg) (07/01 0013) Weight change:  Last BM Date: 11/02/14 Intake/Output from previous day: -1550 06/30 0701 - 07/01 0700 In: -  Out: 1550 [Urine:1550] Intake/Output this shift: Total I/O In: -  Out: 1400 [Urine:1400]  PE: per Dr. Adora Fridge   General:Pleasant affect,  NAD Skin:Warm and dry, brisk capillary refill HEENT:normocephalic, sclera clear, mucus membranes moist Heart:S1S2 RRR without murmur, gallup, rub or click Lungs:clear without rales, rhonchi, or wheezes FVC:BSWH, non tender, + BS, do not palpate liver spleen or masses Ext:no lower ext edema, 2+ pedal pulses, 2+ radial pulses Neuro:alert and oriented, MAE, follows commands, + facial symmetry  tele: SR with PACs   Lab Results:  Recent Labs  11/02/14 0947 11/04/14 0249  WBC 5.1 4.7  HGB 14.5 12.2  HCT 43.9 35.9*  PLT 263 182   BMET  Recent Labs  11/02/14 0947 11/04/14 0249  NA 142 140  K 4.4 3.9  CL 104 105  CO2 27 25  GLUCOSE 203* 207*  BUN 9 12  CREATININE 0.73 0.82  CALCIUM 10.3 9.5   No results for input(s): TROPONINI in the last 72 hours.  Invalid input(s): CK, MB  Lab Results  Component Value Date   CHOL 103 07/06/2011   HDL 32* 07/06/2011   LDLCALC 36 07/06/2011   TRIG 176* 07/06/2011   CHOLHDL 3.2 07/06/2011   Lab Results  Component Value Date   HGBA1C 8.1* 04/08/2014     Lab Results  Component Value Date   TSH 1.632 11/02/2014        Studies/Results: Dg Chest 2 View  11/02/2014   CLINICAL DATA:  Critical lower limb ischemia. Preoperative examination.  EXAM: CHEST  2 VIEW  COMPARISON:  09/13/2013  FINDINGS: Heart and mediastinal contours are within normal limits. No focal opacities or effusions. No acute bony abnormality. Calcifications within the left  breast are stable since prior study.  IMPRESSION: No active cardiopulmonary disease.   Electronically Signed   By: Rolm Baptise M.D.   On: 11/02/2014 10:42    Medications: I have reviewed the patient's current medications. Scheduled Meds: . amLODipine  5 mg Oral Daily   And  . irbesartan  300 mg Oral Daily   And  . hydrochlorothiazide  12.5 mg Oral Daily  . aspirin EC  325 mg Oral Daily  . clopidogrel  75 mg Oral QHS  . fluticasone  1 spray Each Nare Daily  . insulin aspart protamine-  aspart  15-35 Units Subcutaneous BID WC  . pantoprazole  40 mg Oral Daily  . rosuvastatin  20 mg Oral QHS   Continuous Infusions:  PRN Meds:.acetaminophen, albuterol, ALPRAZolam, morphine injection, nitroGLYCERIN, nitroGLYCERIN, ondansetron (ZOFRAN) IV, potassium chloride SA, SUMAtriptan, traMADol  Assessment/Plan: Principal Problem:   Critical lower limb ischemia Active Problems:   S/P arterial stent-mid Lt SFA 11/03/14 DAPT   DM2 (diabetes mellitus, type 2)   Hyperlipidemia LDL goal <70- treated   Essential hypertension   Peripheral arterial disease     We will get follow-up lower extremity arterial Doppler studies in our [employer] office next week and Dr. Adora Fridge  will see her back the following week in the office for follow-up  Time spent with pt. :15 minutes. [facility] R  Nurse Practitioner Certified Pager [phone] or after 5pm and on weekends call [phone] 11/04/2014, 6:33 AM   Agree with note written by Cecilie Kicks RNP  S/P LSFA [facility] 1 atherectomy/ Viabahn stent of LSFA CTO and CLI. Looks great. 2+ PP. Right groin OK. D/C home this AM. DAPT. LEA at [facility] next week then ROV with me.  Quay Burow 11/04/2014 7:47 AM

## 2014-11-08 ENCOUNTER — Telehealth: Payer: Self-pay | Admitting: Cardiovascular Disease

## 2014-11-08 NOTE — Telephone Encounter (Signed)
Spoke with patient. She had PV angiogram & stents placed on 6/30. She is having pain in her left leg. Informed patient that Dr. Gwenlyn Found does not prescribe pain medication. She voiced understanding of this. She reports she got an Rx for pain medication from hospital - tramadol Rx on file from 10/2014 - patient states she will have this prescription filled. She has tried OTC regular strength tylenol with no relief.

## 2014-11-08 NOTE — Telephone Encounter (Signed)
Ms. Craigo is calling because she is in a lot of pain after her stents were placed on 11/03/14 and wants to get something for the pain . Please call   Thanks

## 2014-11-09 ENCOUNTER — Other Ambulatory Visit: Payer: Self-pay | Admitting: Cardiovascular Disease

## 2014-11-09 DIAGNOSIS — I739 Peripheral vascular disease, unspecified: Secondary | ICD-10-CM

## 2014-11-15 ENCOUNTER — Telehealth (HOSPITAL_COMMUNITY): Payer: Self-pay | Admitting: *Deleted

## 2014-11-16 ENCOUNTER — Ambulatory Visit (HOSPITAL_COMMUNITY)
Admission: RE | Admit: 2014-11-16 | Discharge: 2014-11-16 | Disposition: A | Discharge status: Home / Self Care | Payer: Medicare Other | Source: Ambulatory Visit | Attending: Cardiology | Admitting: Cardiology

## 2014-11-16 ENCOUNTER — Ambulatory Visit (HOSPITAL_BASED_OUTPATIENT_CLINIC_OR_DEPARTMENT_OTHER)
Admission: RE | Admit: 2014-11-16 | Discharge: 2014-11-16 | Disposition: A | Discharge status: Home / Self Care | Payer: Medicare Other | Source: Ambulatory Visit | Attending: Cardiology | Admitting: Cardiology

## 2014-11-16 DIAGNOSIS — E119 Type 2 diabetes mellitus without complications: Secondary | ICD-10-CM | POA: Insufficient documentation

## 2014-11-16 DIAGNOSIS — Z48812 Encounter for surgical aftercare following surgery on the circulatory system: Secondary | ICD-10-CM | POA: Insufficient documentation

## 2014-11-16 DIAGNOSIS — Z8673 Personal history of transient ischemic attack (TIA), and cerebral infarction without residual deficits: Secondary | ICD-10-CM | POA: Diagnosis not present

## 2014-11-16 DIAGNOSIS — R202 Paresthesia of skin: Secondary | ICD-10-CM | POA: Diagnosis not present

## 2014-11-16 DIAGNOSIS — I998 Other disorder of circulatory system: Secondary | ICD-10-CM

## 2014-11-16 DIAGNOSIS — Z9889 Other specified postprocedural states: Secondary | ICD-10-CM | POA: Diagnosis not present

## 2014-11-16 DIAGNOSIS — Z95820 Peripheral vascular angioplasty status with implants and grafts: Secondary | ICD-10-CM | POA: Diagnosis not present

## 2014-11-16 DIAGNOSIS — E785 Hyperlipidemia, unspecified: Secondary | ICD-10-CM | POA: Insufficient documentation

## 2014-11-16 DIAGNOSIS — I739 Peripheral vascular disease, unspecified: Secondary | ICD-10-CM | POA: Diagnosis not present

## 2014-11-16 DIAGNOSIS — Z959 Presence of cardiac and vascular implant and graft, unspecified: Secondary | ICD-10-CM

## 2014-11-16 DIAGNOSIS — I70229 Atherosclerosis of native arteries of extremities with rest pain, unspecified extremity: Secondary | ICD-10-CM

## 2014-11-16 DIAGNOSIS — I10 Essential (primary) hypertension: Secondary | ICD-10-CM | POA: Insufficient documentation

## 2014-11-18 ENCOUNTER — Ambulatory Visit (INDEPENDENT_AMBULATORY_CARE_PROVIDER_SITE_OTHER): Payer: Medicare Other | Admitting: Cardiovascular Disease

## 2014-11-18 ENCOUNTER — Encounter: Payer: Self-pay | Admitting: Cardiovascular Disease

## 2014-11-18 VITALS — BP 108/62 | HR 104 | Ht 64.0 in | Wt 137.0 lb

## 2014-11-18 DIAGNOSIS — Z79899 Other long term (current) drug therapy: Secondary | ICD-10-CM

## 2014-11-18 DIAGNOSIS — I998 Other disorder of circulatory system: Secondary | ICD-10-CM

## 2014-11-18 DIAGNOSIS — I10 Essential (primary) hypertension: Secondary | ICD-10-CM

## 2014-11-18 DIAGNOSIS — E785 Hyperlipidemia, unspecified: Secondary | ICD-10-CM | POA: Diagnosis not present

## 2014-11-18 DIAGNOSIS — I739 Peripheral vascular disease, unspecified: Secondary | ICD-10-CM | POA: Diagnosis not present

## 2014-11-18 DIAGNOSIS — I70229 Atherosclerosis of native arteries of extremities with rest pain, unspecified extremity: Secondary | ICD-10-CM

## 2014-11-18 NOTE — Assessment & Plan Note (Signed)
History of hypertension with blood pressure measurements at 108/62. She is on olmesartan, amlodipine and Hydrocort thiazide. Continue current meds at current dosing

## 2014-11-18 NOTE — Progress Notes (Signed)
11/18/2014 [name]   1955/05/27  [id number]  Primary Physician No PCP Per Patient Primary Cardiologist: Lorretta Harp MD Renae Gloss   HPI:  Mrs. Amison is a very pleasant 59 year old thin-appearing divorced African-American female mother of one, grandmother of one grandchild who is currently disabled because of a prior stroke. She was referred by Dr. Melony Overly, from [facility] podiatry, for evaluation and treatment of critical limb ischemia. I last saw her in the office 11/02/14. Her cardiovascular risk factor profile is notable for a strong family history of heart disease with the father about a stent, mother who had bypass surgery and a sister who died at age 25 of a myocardial infarction. She has never had a heart attack but apparently has had a stroke in the past with some mild left-sided residua. She has a history of tobacco abuse in the last 43 years of one third pack per day trying to quit currently. She has treated diabetes, hypertension and hyperlipidemia. She had the onset of left ear pain approximately 3 months ago with progression to critical limb ischemia in early June with ischemic appearing left fourth toe. Dopplers in our office performed yesterday revealed a left ABI 0.6 with an occluded left SFA and one-vessel runoff. She will need to be admitted for angiography and potential endovascular therapy for critical limb ischemia. Angiogram to her on 11/03/14 revealing occluded left SFA. I performed [facility] one directional atherectomy, PTA and stenting using a Viabahn  covered stent with an excellent angiographic and clinical result. Her pain has resolved. Her critical limb ischemia has  resolved. Her Dopplers have normalized.   Current Outpatient Prescriptions  Medication Sig Dispense Refill  . acetaminophen (TYLENOL) 325 MG tablet Take 2 tablets (650 mg total) by mouth every 4 (four) hours as needed for headache or mild pain.    Marland Kitchen albuterol (PROAIR HFA) 108 (90 BASE)  MCG/ACT inhaler Inhale 2 puffs into the lungs every 6 (six) hours as needed for wheezing or shortness of breath.    . ALPRAZolam (XANAX) 0.5 MG tablet Take 0.25-0.5 mg by mouth 2 (two) times daily as needed for anxiety.     Marland Kitchen aspirin EC 325 MG EC tablet Take 1 tablet (325 mg total) by mouth daily. 30 tablet 0  . b complex vitamins tablet Take 1 tablet by mouth daily.    . Calcium Carb-Cholecalciferol (CALCIUM 600 + D PO) Take 1 tablet by mouth daily.    . celecoxib (CELEBREX) 200 MG capsule Take 200 mg by mouth daily as needed for mild pain.   1  . clopidogrel (PLAVIX) 75 MG tablet Take 75 mg by mouth at bedtime.     . insulin lispro protamine-lispro (HUMALOG 75/25 MIX) (75-25) 100 UNIT/ML SUSP injection Inject 22-35 Units into the skin 2 (two) times daily with a meal. Sliding scale:  Morning dose (after breaksfast):  CBG 110-200 22 units, 201-275 28 units, 276-350 35 units; evening dose (after supper) 15 units unless CBG is >110, then use morning sliding scale.    . loratadine (CLARITIN) 10 MG tablet Take 10 mg by mouth daily as needed for allergies.    . mometasone (NASONEX) 50 MCG/ACT nasal spray Place 1 spray into the nose daily as needed (allergies).     . mupirocin cream (BACTROBAN) 2 % Apply 1 application topically 2 (two) times daily as needed (wound care (boils)).     . nitroGLYCERIN (NITRODUR - DOSED IN MG/24 HR) 0.4 mg/hr Place 0.4 mg onto the  skin as needed (chest pain).     . nitroGLYCERIN (NITROSTAT) 0.4 MG SL tablet Place 0.4 mg under the tongue every 5 (five) minutes as needed for chest pain. Maximum 3 doses    . Olmesartan-Amlodipine-HCTZ (TRIBENZOR) 40-5-12.5 MG TABS Take 1 tablet by mouth daily.     . ONE TOUCH ULTRA TEST test strip     . pantoprazole (PROTONIX) 40 MG tablet Take 1 tablet (40 mg total) by mouth daily. 30 tablet 11  . potassium chloride SA (K-DUR,KLOR-CON) 20 MEQ tablet Take 20 mEq by mouth daily as needed (for cramps).    . rosuvastatin (CRESTOR) 20 MG tablet Take  20 mg by mouth at bedtime.     . SUMAtriptan (IMITREX) 50 MG tablet Take 50 mg by mouth every 2 (two) hours as needed for migraine.    Marland Kitchen tiZANidine (ZANAFLEX) 4 MG tablet Take 1 tablet (4 mg total) by mouth every 6 (six) hours as needed for muscle spasms. 60 tablet 0  . traMADol (ULTRAM) 50 MG tablet Take 1 tablet (50 mg total) by mouth every 6 (six) hours as needed. 20 tablet 0   No current facility-administered medications for this visit.    Allergies  Allergen Reactions  . Bactrim [Sulfamethoxazole-Trimethoprim] Shortness Of Breath  . Sulfa Antibiotics Shortness Of Breath and Palpitations  . Fish Allergy Hives and Swelling    Tongue swelling  . Iodine Swelling  . Metformin And Related Diarrhea  . Adhesive [Tape] Rash    Paper tape is ok  . Shellfish Allergy Swelling and Rash    Tongue swelling    History   Social History  . Marital Status: Divorced    Spouse Name: N/A  . Number of Children: 1  . Years of Education: N/A   Occupational History  .     Social History Main Topics  . Smoking status: Current Every Day Smoker -- 0.33 packs/day for 41 years    Types: Cigarettes    Last Attempt to Quit: 07/25/2013  . Smokeless tobacco: Never Used  . Alcohol Use: No  . Drug Use: No     Comment: 09/14/2012  "quit 07/27/1994"  . Sexual Activity: Not Currently    Birth Control/ Protection: Abstinence   Other Topics Concern  . Not on file   Social History Narrative     Review of Systems: General: negative for chills, fever, night sweats or weight changes.  Cardiovascular: negative for chest pain, dyspnea on exertion, edema, orthopnea, palpitations, paroxysmal nocturnal dyspnea or shortness of breath Dermatological: negative for rash Respiratory: negative for cough or wheezing Urologic: negative for hematuria Abdominal: negative for nausea, vomiting, diarrhea, bright red blood per rectum, melena, or hematemesis Neurologic: negative for visual changes, syncope, or  dizziness All other systems reviewed and are otherwise negative except as noted above.    Blood pressure 108/62, pulse 104, height 5\' 4"  (1.626 m), weight 137 lb (62.143 kg).  General appearance: alert and no distress Neck: no adenopathy, no carotid bruit, no JVD, supple, symmetrical, trachea midline and thyroid not enlarged, symmetric, no tenderness/mass/nodules Lungs: clear to auscultation bilaterally Heart: regular rate and rhythm, S1, S2 normal, no murmur, click, rub or gallop Extremities: extremities normal, atraumatic, no cyanosis or edema  EKG not performed today  ASSESSMENT AND PLAN:   Critical lower limb ischemia Status post angiography on 11/03/14 revealing occluded left SFA. She underwent  [facility] one directional atherectomy, PTA and stenting using a Viabahn covered stent and excellent angiographic and clinical result. Her pain  has resolved. Critical limb ischemia has resolved. Her follow-up Doppler study performed 2 days ago normalized. She is on aspirin and Plavix. We will continue to follow her by ultrasound on a semiannual basis. Continued total and type of therapy.  Essential hypertension History of hypertension with blood pressure measurements at 108/62. She is on olmesartan, amlodipine and Hydrocort thiazide. Continue current meds at current dosing      Lorretta Harp MD [facility] 11/18/2014 5:11 PM

## 2014-11-18 NOTE — Assessment & Plan Note (Signed)
Status post angiography on 11/03/14 revealing occluded left SFA. She underwent  [facility] one directional atherectomy, PTA and stenting using a Viabahn covered stent and excellent angiographic and clinical result. Her pain has resolved. Critical limb ischemia has resolved. Her follow-up Doppler study performed 2 days ago normalized. She is on aspirin and Plavix. We will continue to follow her by ultrasound on a semiannual basis. Continued total and type of therapy.

## 2014-11-18 NOTE — Patient Instructions (Signed)
  We will see you back in follow up in 6 months with Dr Gwenlyn Found.   Dr Gwenlyn Found has ordered: 1. Lower extremity arterial doppler (in 6 months)- During this test, ultrasound is used to evaluate arterial blood flow in the legs. Allow approximately one hour for this exam.   2. A FASTING lipid profile: to be done at your convenience.  There is a Psychologist, forensic lab on the first floor of this building, [address].  They are open from 8am-5pm with a lunch from 12-2.  You do not need an appointment.

## 2015-02-01 DIAGNOSIS — D519 Vitamin B12 deficiency anemia, unspecified: Secondary | ICD-10-CM | POA: Diagnosis not present

## 2015-02-01 DIAGNOSIS — E119 Type 2 diabetes mellitus without complications: Secondary | ICD-10-CM | POA: Diagnosis not present

## 2015-02-01 DIAGNOSIS — Z23 Encounter for immunization: Secondary | ICD-10-CM | POA: Diagnosis not present

## 2015-02-01 DIAGNOSIS — I10 Essential (primary) hypertension: Secondary | ICD-10-CM | POA: Diagnosis not present

## 2015-02-01 DIAGNOSIS — E782 Mixed hyperlipidemia: Secondary | ICD-10-CM | POA: Diagnosis not present

## 2015-02-02 DIAGNOSIS — I10 Essential (primary) hypertension: Secondary | ICD-10-CM | POA: Diagnosis not present

## 2015-02-02 DIAGNOSIS — E559 Vitamin D deficiency, unspecified: Secondary | ICD-10-CM | POA: Diagnosis not present

## 2015-02-02 DIAGNOSIS — K210 Gastro-esophageal reflux disease with esophagitis: Secondary | ICD-10-CM | POA: Diagnosis not present

## 2015-02-02 DIAGNOSIS — E785 Hyperlipidemia, unspecified: Secondary | ICD-10-CM | POA: Diagnosis not present

## 2015-02-02 DIAGNOSIS — E119 Type 2 diabetes mellitus without complications: Secondary | ICD-10-CM | POA: Diagnosis not present

## 2015-02-02 DIAGNOSIS — I519 Heart disease, unspecified: Secondary | ICD-10-CM | POA: Diagnosis not present

## 2015-02-04 DIAGNOSIS — E1140 Type 2 diabetes mellitus with diabetic neuropathy, unspecified: Secondary | ICD-10-CM | POA: Diagnosis not present

## 2015-02-04 DIAGNOSIS — Z720 Tobacco use: Secondary | ICD-10-CM | POA: Diagnosis not present

## 2015-02-04 DIAGNOSIS — I2510 Atherosclerotic heart disease of native coronary artery without angina pectoris: Secondary | ICD-10-CM | POA: Diagnosis not present

## 2015-02-04 DIAGNOSIS — G589 Mononeuropathy, unspecified: Secondary | ICD-10-CM | POA: Diagnosis not present

## 2015-02-04 DIAGNOSIS — E782 Mixed hyperlipidemia: Secondary | ICD-10-CM | POA: Diagnosis not present

## 2015-02-09 ENCOUNTER — Other Ambulatory Visit (HOSPITAL_COMMUNITY): Payer: Self-pay | Admitting: Internal Medicine

## 2015-02-09 DIAGNOSIS — Z780 Asymptomatic menopausal state: Secondary | ICD-10-CM

## 2015-02-09 DIAGNOSIS — M8580 Other specified disorders of bone density and structure, unspecified site: Secondary | ICD-10-CM

## 2015-02-13 ENCOUNTER — Inpatient Hospital Stay (HOSPITAL_COMMUNITY): Admission: RE | Admit: 2015-02-13 | Payer: Medicare Other | Source: Ambulatory Visit

## 2015-02-14 ENCOUNTER — Other Ambulatory Visit (HOSPITAL_COMMUNITY): Payer: Self-pay | Admitting: Internal Medicine

## 2015-02-14 DIAGNOSIS — Z1231 Encounter for screening mammogram for malignant neoplasm of breast: Secondary | ICD-10-CM

## 2015-02-15 ENCOUNTER — Ambulatory Visit (HOSPITAL_COMMUNITY)
Admission: RE | Admit: 2015-02-15 | Discharge: 2015-02-15 | Disposition: A | Discharge status: Home / Self Care | Payer: Medicare Other | Source: Ambulatory Visit | Attending: Internal Medicine | Admitting: Internal Medicine

## 2015-02-15 DIAGNOSIS — M810 Age-related osteoporosis without current pathological fracture: Secondary | ICD-10-CM | POA: Insufficient documentation

## 2015-02-15 DIAGNOSIS — M8580 Other specified disorders of bone density and structure, unspecified site: Secondary | ICD-10-CM

## 2015-02-15 DIAGNOSIS — Z780 Asymptomatic menopausal state: Secondary | ICD-10-CM | POA: Diagnosis not present

## 2015-02-15 DIAGNOSIS — M85851 Other specified disorders of bone density and structure, right thigh: Secondary | ICD-10-CM | POA: Diagnosis not present

## 2015-03-08 DIAGNOSIS — I2510 Atherosclerotic heart disease of native coronary artery without angina pectoris: Secondary | ICD-10-CM | POA: Diagnosis not present

## 2015-03-08 DIAGNOSIS — E1140 Type 2 diabetes mellitus with diabetic neuropathy, unspecified: Secondary | ICD-10-CM | POA: Diagnosis not present

## 2015-03-08 DIAGNOSIS — Z720 Tobacco use: Secondary | ICD-10-CM | POA: Diagnosis not present

## 2015-03-08 DIAGNOSIS — I639 Cerebral infarction, unspecified: Secondary | ICD-10-CM | POA: Diagnosis not present

## 2015-03-08 DIAGNOSIS — E782 Mixed hyperlipidemia: Secondary | ICD-10-CM | POA: Diagnosis not present

## 2015-03-13 ENCOUNTER — Ambulatory Visit (HOSPITAL_COMMUNITY)
Admission: RE | Admit: 2015-03-13 | Discharge: 2015-03-13 | Disposition: A | Discharge status: Home / Self Care | Payer: Medicare Other | Source: Ambulatory Visit | Attending: Internal Medicine | Admitting: Internal Medicine

## 2015-03-13 DIAGNOSIS — Z1231 Encounter for screening mammogram for malignant neoplasm of breast: Secondary | ICD-10-CM | POA: Insufficient documentation

## 2015-04-05 DIAGNOSIS — E1140 Type 2 diabetes mellitus with diabetic neuropathy, unspecified: Secondary | ICD-10-CM | POA: Diagnosis not present

## 2015-04-05 DIAGNOSIS — Z720 Tobacco use: Secondary | ICD-10-CM | POA: Diagnosis not present

## 2015-04-05 DIAGNOSIS — I2510 Atherosclerotic heart disease of native coronary artery without angina pectoris: Secondary | ICD-10-CM | POA: Diagnosis not present

## 2015-04-05 DIAGNOSIS — Z048 Encounter for examination and observation for other specified reasons: Secondary | ICD-10-CM | POA: Diagnosis not present

## 2015-04-05 DIAGNOSIS — E559 Vitamin D deficiency, unspecified: Secondary | ICD-10-CM | POA: Diagnosis not present

## 2015-04-11 DIAGNOSIS — H25043 Posterior subcapsular polar age-related cataract, bilateral: Secondary | ICD-10-CM | POA: Diagnosis not present

## 2015-04-11 DIAGNOSIS — H2513 Age-related nuclear cataract, bilateral: Secondary | ICD-10-CM | POA: Diagnosis not present

## 2015-04-11 DIAGNOSIS — Z794 Long term (current) use of insulin: Secondary | ICD-10-CM | POA: Diagnosis not present

## 2015-04-11 DIAGNOSIS — H2512 Age-related nuclear cataract, left eye: Secondary | ICD-10-CM | POA: Diagnosis not present

## 2015-04-11 DIAGNOSIS — E119 Type 2 diabetes mellitus without complications: Secondary | ICD-10-CM | POA: Diagnosis not present

## 2015-05-05 NOTE — Patient Instructions (Signed)
Your procedure is scheduled on: 05/16/2015  Report to [facility] at  645  AM.  Call this number if you have problems the morning of surgery: [phone]   Do not eat food or drink liquids :After Midnight.      Take these medicines the morning of surgery with A SIP OF WATER: xanax, celebrex, protonix, zanaflex and ultram (if needed). Take 1/2 of your usual insulin doseage the night before your surgery. Take your inhaler before you come. DO NOT take any medicines the morning of your surgery for diabetes.   Do not wear jewelry, make-up or nail polish.  Do not wear lotions, powders, or perfumes. You may wear deodorant.  Do not shave 48 hours prior to surgery.  Do not bring valuables to the hospital.  Contacts, dentures or bridgework may not be worn into surgery.  Leave suitcase in the car. After surgery it may be brought to your room.  For patients admitted to the hospital, checkout time is 11:00 AM the day of discharge.   Patients discharged the day of surgery will not be allowed to drive home.  :     Please read over the following fact sheets that you were given: Coughing and Deep Breathing, Surgical Site Infection Prevention, Anesthesia Post-op Instructions and Care and Recovery After Surgery    Cataract A cataract is a clouding of the lens of the eye. When a lens becomes cloudy, vision is reduced based on the degree and nature of the clouding. Many cataracts reduce vision to some degree. Some cataracts make people more near-sighted as they develop. Other cataracts increase glare. Cataracts that are ignored and become worse can sometimes look white. The white color can be seen through the pupil. CAUSES   Aging. However, cataracts may occur at any age, even in newborns.   Certain drugs.   Trauma to the eye.   Certain diseases such as diabetes.   Specific eye diseases such as chronic inflammation inside the eye or a sudden attack of a rare form of glaucoma.   Inherited or acquired  medical problems.  SYMPTOMS   Gradual, progressive drop in vision in the affected eye.   Severe, rapid visual loss. This most often happens when trauma is the cause.  DIAGNOSIS  To detect a cataract, an eye doctor examines the lens. Cataracts are best diagnosed with an exam of the eyes with the pupils enlarged (dilated) by drops.  TREATMENT  For an early cataract, vision may improve by using different eyeglasses or stronger lighting. If that does not help your vision, surgery is the only effective treatment. A cataract needs to be surgically removed when vision loss interferes with your everyday activities, such as driving, reading, or watching TV. A cataract may also have to be removed if it prevents examination or treatment of another eye problem. Surgery removes the cloudy lens and usually replaces it with a substitute lens (intraocular lens, IOL).  At a time when both you and your doctor agree, the cataract will be surgically removed. If you have cataracts in both eyes, only one is usually removed at a time. This allows the operated eye to heal and be out of danger from any possible problems after surgery (such as infection or poor wound healing). In rare cases, a cataract may be doing damage to your eye. In these cases, your caregiver may advise surgical removal right away. The vast majority of people who have cataract surgery have better vision afterward. HOME CARE INSTRUCTIONS  If you are not planning surgery, you may be asked to do the following:  Use different eyeglasses.   Use stronger or brighter lighting.   Ask your eye doctor about reducing your medicine dose or changing medicines if it is thought that a medicine caused your cataract. Changing medicines does not make the cataract go away on its own.   Become familiar with your surroundings. Poor vision can lead to injury. Avoid bumping into things on the affected side. You are at a higher risk for tripping or falling.   Exercise  extreme care when driving or operating machinery.   Wear sunglasses if you are sensitive to bright light or experiencing problems with glare.  SEEK IMMEDIATE MEDICAL CARE IF:   You have a worsening or sudden vision loss.   You notice redness, swelling, or increasing pain in the eye.   You have a fever.  Document Released: 04/22/2005 Document Revised: 04/11/2011 Document Reviewed: 12/14/2010 [facility] Patient Information 2012 [place].PATIENT INSTRUCTIONS POST-ANESTHESIA  IMMEDIATELY FOLLOWING SURGERY:  Do not drive or operate machinery for the first twenty four hours after surgery.  Do not make any important decisions for twenty four hours after surgery or while taking narcotic pain medications or sedatives.  If you develop intractable nausea and vomiting or a severe headache please notify your doctor immediately.  FOLLOW-UP:  Please make an appointment with your surgeon as instructed. You do not need to follow up with anesthesia unless specifically instructed to do so.  WOUND CARE INSTRUCTIONS (if applicable):  Keep a dry clean dressing on the anesthesia/puncture wound site if there is drainage.  Once the wound has quit draining you may leave it open to air.  Generally you should leave the bandage intact for twenty four hours unless there is drainage.  If the epidural site drains for more than 36-48 hours please call the anesthesia department.  QUESTIONS?:  Please feel free to call your physician or the hospital operator if you have any questions, and they will be happy to assist you.

## 2015-05-09 ENCOUNTER — Encounter (HOSPITAL_COMMUNITY): Payer: Self-pay

## 2015-05-09 ENCOUNTER — Encounter (HOSPITAL_COMMUNITY)
Admission: RE | Admit: 2015-05-09 | Discharge: 2015-05-09 | Disposition: A | Discharge status: Home / Self Care | Payer: Medicare Other | Source: Ambulatory Visit | Attending: Ophthalmology | Admitting: Ophthalmology

## 2015-05-09 ENCOUNTER — Ambulatory Visit (INDEPENDENT_AMBULATORY_CARE_PROVIDER_SITE_OTHER): Payer: Medicare Other | Admitting: Cardiovascular Disease

## 2015-05-09 ENCOUNTER — Other Ambulatory Visit: Payer: Self-pay

## 2015-05-09 ENCOUNTER — Encounter: Payer: Self-pay | Admitting: Cardiovascular Disease

## 2015-05-09 ENCOUNTER — Other Ambulatory Visit: Payer: Self-pay | Admitting: Cardiovascular Disease

## 2015-05-09 VITALS — BP 120/78 | HR 68 | Ht 64.0 in | Wt 131.0 lb

## 2015-05-09 DIAGNOSIS — Z01818 Encounter for other preprocedural examination: Secondary | ICD-10-CM | POA: Diagnosis not present

## 2015-05-09 DIAGNOSIS — H2512 Age-related nuclear cataract, left eye: Secondary | ICD-10-CM | POA: Insufficient documentation

## 2015-05-09 DIAGNOSIS — E785 Hyperlipidemia, unspecified: Secondary | ICD-10-CM | POA: Diagnosis not present

## 2015-05-09 DIAGNOSIS — Z79899 Other long term (current) drug therapy: Secondary | ICD-10-CM | POA: Diagnosis not present

## 2015-05-09 DIAGNOSIS — Z959 Presence of cardiac and vascular implant and graft, unspecified: Secondary | ICD-10-CM

## 2015-05-09 DIAGNOSIS — I739 Peripheral vascular disease, unspecified: Secondary | ICD-10-CM | POA: Diagnosis not present

## 2015-05-09 DIAGNOSIS — I10 Essential (primary) hypertension: Secondary | ICD-10-CM

## 2015-05-09 HISTORY — DX: Unspecified asthma, uncomplicated: J45.909

## 2015-05-09 LAB — CBC WITH DIFFERENTIAL/PLATELET
Basophils Absolute: 0 10*3/uL (ref 0.0–0.1)
Basophils Relative: 0 %
Eosinophils Absolute: 0 10*3/uL (ref 0.0–0.7)
Eosinophils Relative: 0 %
HCT: 39.8 % (ref 36.0–46.0)
Hemoglobin: 13.3 g/dL (ref 12.0–15.0)
Lymphocytes Relative: 44 %
Lymphs Abs: 2.6 10*3/uL (ref 0.7–4.0)
MCH: 27.9 pg (ref 26.0–34.0)
MCHC: 33.4 g/dL (ref 30.0–36.0)
MCV: 83.4 fL (ref 78.0–100.0)
Monocytes Absolute: 0.3 10*3/uL (ref 0.1–1.0)
Monocytes Relative: 6 %
Neutro Abs: 2.9 10*3/uL (ref 1.7–7.7)
Neutrophils Relative %: 50 %
Platelets: 268 10*3/uL (ref 150–400)
RBC: 4.77 MIL/uL (ref 3.87–5.11)
RDW: 13.5 % (ref 11.5–15.5)
WBC: 5.8 10*3/uL (ref 4.0–10.5)

## 2015-05-09 LAB — BASIC METABOLIC PANEL
Anion gap: 7 (ref 5–15)
BUN: 10 mg/dL (ref 6–20)
CO2: 23 mmol/L (ref 22–32)
Calcium: 9.4 mg/dL (ref 8.9–10.3)
Chloride: 107 mmol/L (ref 101–111)
Creatinine, Ser: 0.66 mg/dL (ref 0.44–1.00)
GFR calc Af Amer: >60 mL/min (ref >60)
GFR calc non Af Amer: >60 mL/min (ref >60)
Glucose, Bld: 95 mg/dL (ref 65–99)
Potassium: 4 mmol/L (ref 3.5–5.1)
Sodium: 137 mmol/L (ref 135–145)

## 2015-05-09 NOTE — Patient Instructions (Signed)
Medication Instructions:  Your physician recommends that you continue on your current medications as directed. Please refer to the Current Medication list given to you today.   Labwork: I will get your lab work from your Primary Care Physician.   Testing/Procedures: Your physician has requested that you have a lower extremity arterial doppler- During this test, ultrasound is used to evaluate arterial blood flow in the legs. Allow approximately one hour for this exam.    Follow-Up: Your physician wants you to follow-up in: 12 months with Dr. Berry. You will receive a reminder letter in the mail two months in advance. If you don't receive a letter, please call our office to schedule the follow-up appointment.   Any Other Special Instructions Will Be Listed Below (If Applicable).     If you need a refill on your cardiac medications before your next appointment, please call your pharmacy.   

## 2015-05-09 NOTE — Assessment & Plan Note (Signed)
[name] returns for follow-up of peripheral vascular disease. She was sent to me by Dr. Melony Overly for critical limb ischemia. I angiogramed her 11/03/14 revealing an occluded left SFA which I revascularized and placed a 5 on covered stent. She had three-vessel runoff. Her ischemic wound cuts has subsequently healed and her Doppler study normalized 11/16/14 with a left ABI 1.1.

## 2015-05-09 NOTE — Addendum Note (Signed)
Addended by: Newt Minion on: 05/09/2015 02:51 PM   Modules accepted: Orders

## 2015-05-09 NOTE — Assessment & Plan Note (Addendum)
History of hypertension the blood pressure measures 120/78. She is on Tribenzor.  continue current meds at current dosing

## 2015-05-09 NOTE — Progress Notes (Signed)
05/09/2015 [name]   1956-03-03  [id number]  Primary Physician Wende Neighbors, MD Primary Cardiologist: Lorretta Harp MD [name]   HPI:  [name] is a very pleasant 60 year old thin-appearing divorced African-American female mother of one, grandmother of one grandchild who is currently disabled because of a prior stroke. She was referred by Dr. Melony Overly, from [facility] podiatry, for evaluation and treatment of critical limb ischemia. I last saw her in the office 11/18/14. Her cardiovascular risk factor profile is notable for a strong family history of heart disease with the father about a stent, mother who had bypass surgery and a sister who died at age 65 of a myocardial infarction. She has never had a heart attack but apparently has had a stroke in the past with some mild left-sided residua. She has a history of tobacco abuse in the last 43 years of one third pack per day trying to quit currently. She has treated diabetes, hypertension and hyperlipidemia. She had the onset of left ear pain approximately 3 months ago with progression to critical limb ischemia in early June with ischemic appearing left fourth toe. Dopplers in our office performed yesterday revealed a left ABI 0.6 with an occluded left SFA and one-vessel runoff. She will need to be admitted for angiography and potential endovascular therapy for critical limb ischemia. Angiogram to her on 11/03/14 revealing occluded left SFA. I performed [facility] one directional atherectomy, PTA and stenting using a Viabahn covered stent with an excellent angiographic and clinical result. Her pain has resolved. Her critical limb ischemia has resolved. Her Dopplers have normalized. Since I saw her 6 months ago she denies claudication chest pain or shortness of breath. She remains on fdual antiplatelet therapy.   Current Outpatient Prescriptions  Medication Sig Dispense Refill  . acetaminophen (TYLENOL) 325 MG tablet Take 2 tablets  (650 mg total) by mouth every 4 (four) hours as needed for headache or mild pain.    Marland Kitchen albuterol (PROAIR HFA) 108 (90 BASE) MCG/ACT inhaler Inhale 2 puffs into the lungs every 6 (six) hours as needed for wheezing or shortness of breath.    . ALPRAZolam (XANAX) 0.5 MG tablet Take 0.25-0.5 mg by mouth 2 (two) times daily as needed for anxiety.     Marland Kitchen aspirin EC 325 MG EC tablet Take 1 tablet (325 mg total) by mouth daily. 30 tablet 0  . b complex vitamins tablet Take 1 tablet by mouth daily.    . Bromfenac Sodium (PROLENSA) 0.07 % SOLN Apply to eye.    . Calcium Carb-Cholecalciferol (CALCIUM 600 + D PO) Take 1 tablet by mouth daily.    . celecoxib (CELEBREX) 200 MG capsule Take 200 mg by mouth daily as needed for mild pain.   1  . clopidogrel (PLAVIX) 75 MG tablet Take 75 mg by mouth at bedtime.     . Dulaglutide (TRULICITY) A999333 0000000 SOPN Inject 1 Units into the skin once a week.    Marland Kitchen ibuprofen (ADVIL,MOTRIN) 800 MG tablet Take 800 mg by mouth every 8 (eight) hours as needed.    . insulin aspart (NOVOLOG) 100 UNIT/ML injection Inject 5 Units into the skin 3 (three) times daily before meals.    . insulin detemir (LEVEMIR) 100 UNIT/ML injection Inject 18 Units into the skin at bedtime.    Marland Kitchen loratadine (CLARITIN) 10 MG tablet Take 10 mg by mouth daily as needed for allergies.    . mometasone (NASONEX) 50 MCG/ACT nasal spray Place 1 spray  into the nose daily as needed (allergies).     . mupirocin cream (BACTROBAN) 2 % Apply 1 application topically 2 (two) times daily as needed (wound care (boils)).     . nitroGLYCERIN (NITRODUR - DOSED IN MG/24 HR) 0.4 mg/hr Place 0.4 mg onto the skin as needed (chest pain).     . nitroGLYCERIN (NITROSTAT) 0.4 MG SL tablet Place 0.4 mg under the tongue every 5 (five) minutes as needed for chest pain. Maximum 3 doses    . OFLOXACIN PO Take by mouth.    . Olmesartan-Amlodipine-HCTZ (TRIBENZOR) 40-5-12.5 MG TABS Take 1 tablet by mouth daily.     . ONE TOUCH ULTRA TEST  test strip     . pantoprazole (PROTONIX) 40 MG tablet Take 1 tablet (40 mg total) by mouth daily. 30 tablet 11  . potassium chloride SA (K-DUR,KLOR-CON) 20 MEQ tablet Take 20 mEq by mouth daily as needed (for cramps).    . rosuvastatin (CRESTOR) 20 MG tablet Take 20 mg by mouth at bedtime.     . SUMAtriptan (IMITREX) 50 MG tablet Take 50 mg by mouth every 2 (two) hours as needed for migraine.    Marland Kitchen zolpidem (AMBIEN) 10 MG tablet Take 5 mg by mouth at bedtime as needed for sleep.     No current facility-administered medications for this visit.    Allergies  Allergen Reactions  . Bactrim [Sulfamethoxazole-Trimethoprim] Shortness Of Breath  . Sulfa Antibiotics Shortness Of Breath and Palpitations  . Fish Allergy Hives and Swelling    Tongue swelling  . Iodine Swelling  . Metformin And Related Diarrhea  . Adhesive [Tape] Rash    Paper tape is ok  . Shellfish Allergy Swelling and Rash    Tongue swelling    Social History   Social History  . Marital Status: Divorced    Spouse Name: N/A  . Number of Children: 1  . Years of Education: N/A   Occupational History  .     Social History Main Topics  . Smoking status: Current Every Day Smoker -- 0.33 packs/day for 41 years    Types: Cigarettes  . Smokeless tobacco: Never Used  . Alcohol Use: No  . Drug Use: No     Comment: 05/09/2015.  "quit 07/27/1994"  . Sexual Activity: Not Currently    Birth Control/ Protection: Abstinence   Other Topics Concern  . Not on file   Social History Narrative     Review of Systems: General: negative for chills, fever, night sweats or weight changes.  Cardiovascular: negative for chest pain, dyspnea on exertion, edema, orthopnea, palpitations, paroxysmal nocturnal dyspnea or shortness of breath Dermatological: negative for rash Respiratory: negative for cough or wheezing Urologic: negative for hematuria Abdominal: negative for nausea, vomiting, diarrhea, bright red blood per rectum, melena, or  hematemesis Neurologic: negative for visual changes, syncope, or dizziness All other systems reviewed and are otherwise negative except as noted above.    Blood pressure 120/78, pulse 68, height 5\' 4"  (1.626 m), weight 131 lb (59.421 kg).  General appearance: alert and no distress Neck: no adenopathy, no carotid bruit, no JVD, supple, symmetrical, trachea midline and thyroid not enlarged, symmetric, no tenderness/mass/nodules Lungs: clear to auscultation bilaterally Heart: regular rate and rhythm, S1, S2 normal, no murmur, click, rub or gallop Extremities: extremities normal, atraumatic, no cyanosis or edema  EKG not performed today  ASSESSMENT AND PLAN:   S/P arterial stent-mid Lt SFA 11/03/14 [name] returns for follow-up of peripheral vascular disease. She  was sent to me by Dr. Melony Overly for critical limb ischemia. I angiogramed her 11/03/14 revealing an occluded left SFA which I revascularized and placed a 5 on covered stent. She had three-vessel runoff. Her ischemic wound cuts has subsequently healed and her Doppler study normalized 11/16/14 with a left ABI 1.1.  Hyperlipidemia LDL goal <70 History of hyperlipidemia on Crestor followed by her PCP  Essential hypertension History of hypertension the blood pressure measures 120/78. She is on Tribenzor.  continue current meds at current dosing      Lorretta Harp MD [facility] 05/09/2015 2:44 PM

## 2015-05-09 NOTE — Assessment & Plan Note (Signed)
History of hyperlipidemia on Crestor followed by her PCP 

## 2015-05-12 ENCOUNTER — Inpatient Hospital Stay (HOSPITAL_COMMUNITY): Admission: RE | Admit: 2015-05-12 | Payer: Medicare Other | Source: Ambulatory Visit

## 2015-05-15 MED ORDER — PHENYLEPHRINE HCL 2.5 % OP SOLN
OPHTHALMIC | Status: AC
  Filled 2015-05-15: qty 15

## 2015-05-15 MED ORDER — KETOROLAC TROMETHAMINE 0.5 % OP SOLN
OPHTHALMIC | Status: AC
  Filled 2015-05-15: qty 5

## 2015-05-15 MED ORDER — CYCLOPENTOLATE-PHENYLEPHRINE OP SOLN OPTIME - NO CHARGE
OPHTHALMIC | Status: AC
  Filled 2015-05-15: qty 2

## 2015-05-15 MED ORDER — TETRACAINE HCL 0.5 % OP SOLN
OPHTHALMIC | Status: AC
  Filled 2015-05-15: qty 4

## 2015-05-16 ENCOUNTER — Ambulatory Visit (HOSPITAL_COMMUNITY): Payer: Medicare Other | Admitting: Anesthesiology

## 2015-05-16 ENCOUNTER — Encounter (HOSPITAL_COMMUNITY): Payer: Self-pay | Admitting: *Deleted

## 2015-05-16 ENCOUNTER — Ambulatory Visit (HOSPITAL_COMMUNITY)
Admission: RE | Admit: 2015-05-16 | Discharge: 2015-05-16 | Disposition: A | Discharge status: Home / Self Care | Payer: Medicare Other | Source: Ambulatory Visit | Attending: Ophthalmology | Admitting: Ophthalmology

## 2015-05-16 ENCOUNTER — Encounter (HOSPITAL_COMMUNITY)
Admission: RE | Disposition: A | Discharge status: Home / Self Care | Payer: Self-pay | Source: Ambulatory Visit | Attending: Ophthalmology

## 2015-05-16 DIAGNOSIS — K219 Gastro-esophageal reflux disease without esophagitis: Secondary | ICD-10-CM | POA: Diagnosis not present

## 2015-05-16 DIAGNOSIS — E669 Obesity, unspecified: Secondary | ICD-10-CM | POA: Diagnosis not present

## 2015-05-16 DIAGNOSIS — H2512 Age-related nuclear cataract, left eye: Secondary | ICD-10-CM | POA: Diagnosis not present

## 2015-05-16 DIAGNOSIS — G8194 Hemiplegia, unspecified affecting left nondominant side: Secondary | ICD-10-CM | POA: Insufficient documentation

## 2015-05-16 DIAGNOSIS — Z794 Long term (current) use of insulin: Secondary | ICD-10-CM | POA: Diagnosis not present

## 2015-05-16 DIAGNOSIS — Z79899 Other long term (current) drug therapy: Secondary | ICD-10-CM | POA: Insufficient documentation

## 2015-05-16 DIAGNOSIS — E119 Type 2 diabetes mellitus without complications: Secondary | ICD-10-CM | POA: Diagnosis not present

## 2015-05-16 DIAGNOSIS — Z8249 Family history of ischemic heart disease and other diseases of the circulatory system: Secondary | ICD-10-CM | POA: Diagnosis not present

## 2015-05-16 DIAGNOSIS — F419 Anxiety disorder, unspecified: Secondary | ICD-10-CM | POA: Diagnosis not present

## 2015-05-16 DIAGNOSIS — Z90710 Acquired absence of both cervix and uterus: Secondary | ICD-10-CM | POA: Insufficient documentation

## 2015-05-16 DIAGNOSIS — I252 Old myocardial infarction: Secondary | ICD-10-CM | POA: Diagnosis not present

## 2015-05-16 DIAGNOSIS — Z823 Family history of stroke: Secondary | ICD-10-CM | POA: Insufficient documentation

## 2015-05-16 DIAGNOSIS — Z833 Family history of diabetes mellitus: Secondary | ICD-10-CM | POA: Insufficient documentation

## 2015-05-16 DIAGNOSIS — Z8543 Personal history of malignant neoplasm of ovary: Secondary | ICD-10-CM | POA: Diagnosis not present

## 2015-05-16 DIAGNOSIS — E538 Deficiency of other specified B group vitamins: Secondary | ICD-10-CM | POA: Insufficient documentation

## 2015-05-16 DIAGNOSIS — H25042 Posterior subcapsular polar age-related cataract, left eye: Secondary | ICD-10-CM | POA: Diagnosis not present

## 2015-05-16 DIAGNOSIS — Z91013 Allergy to seafood: Secondary | ICD-10-CM | POA: Diagnosis not present

## 2015-05-16 DIAGNOSIS — E785 Hyperlipidemia, unspecified: Secondary | ICD-10-CM | POA: Diagnosis not present

## 2015-05-16 DIAGNOSIS — I639 Cerebral infarction, unspecified: Secondary | ICD-10-CM | POA: Diagnosis not present

## 2015-05-16 DIAGNOSIS — I10 Essential (primary) hypertension: Secondary | ICD-10-CM | POA: Diagnosis not present

## 2015-05-16 DIAGNOSIS — H269 Unspecified cataract: Secondary | ICD-10-CM | POA: Diagnosis not present

## 2015-05-16 DIAGNOSIS — Z881 Allergy status to other antibiotic agents status: Secondary | ICD-10-CM | POA: Insufficient documentation

## 2015-05-16 DIAGNOSIS — J45909 Unspecified asthma, uncomplicated: Secondary | ICD-10-CM | POA: Diagnosis not present

## 2015-05-16 HISTORY — PX: CATARACT EXTRACTION W/PHACO: SHX586

## 2015-05-16 LAB — GLUCOSE, CAPILLARY: Glucose-Capillary: 98 mg/dL (ref 65–99)

## 2015-05-16 SURGERY — PHACOEMULSIFICATION, CATARACT, WITH IOL INSERTION
Anesthesia: Monitor Anesthesia Care | Site: Eye | Laterality: Left

## 2015-05-16 MED ORDER — EPINEPHRINE HCL 1 MG/ML IJ SOLN
INTRAOCULAR | Status: DC | PRN
  Administered 2015-05-16: 500 mL

## 2015-05-16 MED ORDER — CYCLOPENTOLATE-PHENYLEPHRINE 0.2-1 % OP SOLN
1.0000 [drp] | OPHTHALMIC | Status: AC
  Administered 2015-05-16 (×3): 1 [drp] via OPHTHALMIC

## 2015-05-16 MED ORDER — PHENYLEPHRINE HCL 2.5 % OP SOLN
1.0000 [drp] | OPHTHALMIC | Status: AC
  Administered 2015-05-16 (×3): 1 [drp] via OPHTHALMIC

## 2015-05-16 MED ORDER — FENTANYL CITRATE (PF) 100 MCG/2ML IJ SOLN
25.0000 ug | INTRAMUSCULAR | Status: AC
  Administered 2015-05-16: 25 ug via INTRAVENOUS
  Filled 2015-05-16: qty 2

## 2015-05-16 MED ORDER — MIDAZOLAM HCL 2 MG/2ML IJ SOLN
1.0000 mg | INTRAMUSCULAR | Status: DC | PRN
Start: 2015-05-16 — End: 2015-05-16
  Administered 2015-05-16: 2 mg via INTRAVENOUS
  Filled 2015-05-16: qty 2

## 2015-05-16 MED ORDER — LACTATED RINGERS IV SOLN
INTRAVENOUS | Status: DC
  Administered 2015-05-16: 08:00:00 via INTRAVENOUS

## 2015-05-16 MED ORDER — MIDAZOLAM HCL 2 MG/2ML IJ SOLN
INTRAMUSCULAR | Status: AC
  Filled 2015-05-16: qty 2

## 2015-05-16 MED ORDER — BSS IO SOLN
INTRAOCULAR | Status: DC | PRN
  Administered 2015-05-16: 15 mL

## 2015-05-16 MED ORDER — TETRACAINE 0.5 % OP SOLN OPTIME - NO CHARGE
OPHTHALMIC | Status: DC | PRN
  Administered 2015-05-16: 1 [drp] via OPHTHALMIC

## 2015-05-16 MED ORDER — MIDAZOLAM HCL 5 MG/5ML IJ SOLN
INTRAMUSCULAR | Status: DC | PRN
  Administered 2015-05-16: 1 mg via INTRAVENOUS

## 2015-05-16 MED ORDER — EPINEPHRINE HCL 1 MG/ML IJ SOLN
INTRAMUSCULAR | Status: AC
  Filled 2015-05-16: qty 1

## 2015-05-16 MED ORDER — PROVISC 10 MG/ML IO SOLN
INTRAOCULAR | Status: DC | PRN
  Administered 2015-05-16: 0.85 mL via INTRAOCULAR

## 2015-05-16 MED ORDER — TETRACAINE HCL 0.5 % OP SOLN
1.0000 [drp] | OPHTHALMIC | Status: AC
  Administered 2015-05-16 (×3): 1 [drp] via OPHTHALMIC

## 2015-05-16 MED ORDER — KETOROLAC TROMETHAMINE 0.5 % OP SOLN
1.0000 [drp] | OPHTHALMIC | Status: AC
  Administered 2015-05-16 (×3): 1 [drp] via OPHTHALMIC

## 2015-05-16 SURGICAL SUPPLY — 12 items
CLOTH BEACON ORANGE TIMEOUT ST (SAFETY) ×2 IMPLANT
EYE SHIELD UNIVERSAL CLEAR (GAUZE/BANDAGES/DRESSINGS) ×2 IMPLANT
GLOVE BIOGEL PI IND STRL 6.5 (GLOVE) ×1 IMPLANT
GLOVE BIOGEL PI IND STRL 7.0 (GLOVE) ×1 IMPLANT
GLOVE BIOGEL PI INDICATOR 6.5 (GLOVE) ×1
GLOVE BIOGEL PI INDICATOR 7.0 (GLOVE) ×1
LENS ALC ACRYL/TECN (Ophthalmic Related) ×2 IMPLANT
PAD ARMBOARD 7.5X6 YLW CONV (MISCELLANEOUS) ×2 IMPLANT
TAPE PAPER 2X10 WHT MICROPORE (GAUZE/BANDAGES/DRESSINGS) ×2 IMPLANT
TAPE SURG TRANSPORE 1 IN (GAUZE/BANDAGES/DRESSINGS) ×1 IMPLANT
TAPE SURGICAL TRANSPORE 1 IN (GAUZE/BANDAGES/DRESSINGS) ×1
WATER STERILE IRR 250ML POUR (IV SOLUTION) ×2 IMPLANT

## 2015-05-16 NOTE — Discharge Instructions (Signed)
°  °          [facility] Instructions °[address]- [place] [address] °    ° °1. Avoid closing eyes tightly. One often closes the eye tightly when laughing, talking, sneezing, coughing or if they feel irritated. At these times, you should be careful not to close your eyes tightly. ° °2. Instill eye drops as instructed. To instill drops in your eye, open it, look up and have someone gently pull the lower lid down and instill a couple of drops inside the lower lid. ° °3. Do not touch upper lid. ° °4. Take Advil or Tylenol for pain. ° °5. You may use either eye for near work, such as reading or sewing and you may watch television. ° °6. You may have your hair done at the beauty parlor at any time. ° °7. Wear dark glasses with or without your own glasses if you are in bright light. ° °8. Call our office at [phone] or [phone] if you have sharp pain in your eye or unusual symptoms. ° °9.  FOLLOW UP WITH DR. SHAPIRO TODAY IN HIS [place] OFFICE AT 2:45pm. ° °  °I have received a copy of the above instructions and will follow them.  ° ° ° °IF YOU ARE IN IMMEDIATE DANGER CALL 911! ° °It is important for you to keep your follow-up appointment with your physician after discharge, OR, for you /your caregiver to make a follow-up appointment with your physician / medical provider after discharge. ° °Show these instructions to the next healthcare provider you see. °PATIENT INSTRUCTIONS °POST-ANESTHESIA ° °IMMEDIATELY FOLLOWING SURGERY:  Do not drive or operate machinery for the first twenty four hours after surgery.  Do not make any important decisions for twenty four hours after surgery or while taking narcotic pain medications or sedatives.  If you develop intractable nausea and vomiting or a severe headache please notify your doctor immediately. ° °FOLLOW-UP:  Please make an appointment with your surgeon as instructed. You do not need to follow up with anesthesia unless  specifically instructed to do so. ° °WOUND CARE INSTRUCTIONS (if applicable):  Keep a dry clean dressing on the anesthesia/puncture wound site if there is drainage.  Once the wound has quit draining you may leave it open to air.  Generally you should leave the bandage intact for twenty four hours unless there is drainage.  If the epidural site drains for more than 36-48 hours please call the anesthesia department. ° °QUESTIONS?:  Please feel free to call your physician or the hospital operator if you have any questions, and they will be happy to assist you.    ° ° ° °

## 2015-05-16 NOTE — Anesthesia Postprocedure Evaluation (Signed)
Anesthesia Post Note  Patient: [name]  Procedure(s) Performed: Procedure(s) (LRB): CATARACT EXTRACTION PHACO AND INTRAOCULAR LENS PLACEMENT (IOC) (Left)  Patient location during evaluation: Short Stay Anesthesia Type: MAC Level of consciousness: awake and alert Pain management: pain level controlled Respiratory status: spontaneous breathing Cardiovascular status: stable Postop Assessment: no signs of nausea or vomiting Anesthetic complications: no    Last Vitals:  Filed Vitals:   05/16/15 0845 05/16/15 0850  BP: 95/60 95/59  Resp:      Last Pain: There were no vitals filed for this visit.               Seth Friedlander

## 2015-05-16 NOTE — Anesthesia Preprocedure Evaluation (Signed)
Anesthesia Evaluation  Patient identified by MRN, date of birth, ID band Patient awake    Reviewed: Allergy & Precautions, H&P , NPO status , Patient's Chart, lab work & pertinent test results  Airway Mallampati: II  TM Distance: >3 FB Neck ROM: full    Dental  (+) Dental Advidsory Given, Edentulous Lower, Edentulous Upper   Pulmonary asthma , COPD, Current Smoker,    breath sounds clear to auscultation       Cardiovascular hypertension, Pt. on medications + angina with exertion + Past MI and + Peripheral Vascular Disease   Rhythm:Regular Rate:Normal  Mild CAD on cath   Neuro/Psych  Headaches, PSYCHIATRIC DISORDERS Anxiety CVA    GI/Hepatic hiatal hernia, PUD, GERD  ,  Endo/Other  diabetes, Type 2  Renal/GU      Musculoskeletal   Abdominal   Peds  Hematology  (+) anemia ,   Anesthesia Other Findings   Reproductive/Obstetrics                             Anesthesia Physical Anesthesia Plan  ASA: III  Anesthesia Plan: MAC   Post-op Pain Management:    Induction: Intravenous  Airway Management Planned: Nasal Cannula  Additional Equipment:   Intra-op Plan:   Post-operative Plan:   Informed Consent: I have reviewed the patients History and Physical, chart, labs and discussed the procedure including the risks, benefits and alternatives for the proposed anesthesia with the patient or authorized representative who has indicated his/her understanding and acceptance.     Plan Discussed with:   Anesthesia Plan Comments:         Anesthesia Quick Evaluation

## 2015-05-16 NOTE — Op Note (Signed)
Patient brought to the operating room and prepped and draped in the usual manner.  Lid speculum inserted in left eye.  Stab incision made at the twelve o'clock position.  Provisc instilled in the anterior chamber.   A 2.4 mm. Stab incision was made temporally.  An anterior capsulotomy was done with a bent 25 gauge needle.  The nucleus was hydrodissected.  The Phaco tip was inserted in the anterior chamber and the nucleus was emulsified.  CDE was 4.24.  The cortical material was then removed with the I and A tip.  Posterior capsule was the polished.  The anterior chamber was deepened with Provisc.  A 20.0 Diopter Alcon [id number] IOL was then inserted in the capsular bag.  Provisc was then removed with the I and A tip.  The wound was then hydrated.  Patient sent to the Recovery Room in good condition with follow up in my office.  Preoperative Diagnosis:  Nuclear and PSC Cataract OS Postoperative Diagnosis:  Same Procedure name: Kelman Phacoemulsification OS with IOL

## 2015-05-16 NOTE — Transfer of Care (Signed)
Immediate Anesthesia Transfer of Care Note  Patient: [name]  Procedure(s) Performed: Procedure(s) with comments: CATARACT EXTRACTION PHACO AND INTRAOCULAR LENS PLACEMENT (IOC) (Left) - CDE: 4.24  Patient Location: Short Stay  Anesthesia Type:MAC  Level of Consciousness: awake  Airway & Oxygen Therapy: Patient Spontanous Breathing  Post-op Assessment: Report given to RN  Post vital signs: Reviewed  Last Vitals:  Filed Vitals:   05/16/15 0845 05/16/15 0850  BP: 95/60 95/59  Resp:      Complications: No apparent anesthesia complications

## 2015-05-16 NOTE — H&P (Signed)
The patient was re examined and there is no change in the patients condition since the original H and P. 

## 2015-05-17 ENCOUNTER — Encounter (HOSPITAL_COMMUNITY): Payer: Self-pay | Admitting: Ophthalmology

## 2015-05-23 ENCOUNTER — Encounter (HOSPITAL_COMMUNITY)
Admission: RE | Admit: 2015-05-23 | Discharge: 2015-05-23 | Disposition: A | Discharge status: Home / Self Care | Payer: Medicare Other | Source: Ambulatory Visit | Attending: Ophthalmology | Admitting: Ophthalmology

## 2015-05-23 DIAGNOSIS — H25041 Posterior subcapsular polar age-related cataract, right eye: Secondary | ICD-10-CM | POA: Diagnosis not present

## 2015-05-23 DIAGNOSIS — H2511 Age-related nuclear cataract, right eye: Secondary | ICD-10-CM | POA: Diagnosis not present

## 2015-05-29 ENCOUNTER — Telehealth: Payer: Self-pay | Admitting: Cardiovascular Disease

## 2015-05-29 ENCOUNTER — Other Ambulatory Visit: Payer: Self-pay | Admitting: *Deleted

## 2015-05-29 MED ORDER — TETRACAINE HCL 0.5 % OP SOLN
OPHTHALMIC | Status: AC
  Filled 2015-05-29: qty 4

## 2015-05-29 MED ORDER — NITROGLYCERIN 0.4 MG SL SUBL: 0.4000 mg | SUBLINGUAL_TABLET | SUBLINGUAL | Status: DC | PRN

## 2015-05-29 MED ORDER — PHENYLEPHRINE HCL 2.5 % OP SOLN
OPHTHALMIC | Status: AC
  Filled 2015-05-29: qty 15

## 2015-05-29 MED ORDER — KETOROLAC TROMETHAMINE 0.5 % OP SOLN
OPHTHALMIC | Status: AC
  Filled 2015-05-29: qty 5

## 2015-05-29 MED ORDER — CYCLOPENTOLATE-PHENYLEPHRINE OP SOLN OPTIME - NO CHARGE
OPHTHALMIC | Status: AC
  Filled 2015-05-29: qty 2

## 2015-05-29 NOTE — Telephone Encounter (Signed)
°*  STAT* If patient is at the pharmacy, call can be transferred to refill team.   1. Which medications need to be refilled? (please list name of each medication and dose if known) Nitro  2. Which pharmacy/location (including street and city if local pharmacy) is medication to be sent to?[facility] [address] in [place]    3. Do they need a 30 day or 90 day supply?[place]

## 2015-05-29 NOTE — Telephone Encounter (Signed)
Spoke with pt, she started having chest pain on Saturday. She felt tight in her chest, she took plavix and felt better. She then developed tightness while getting ready for church Sunday. She rested and it went away. She has had no pain today. She had an appt 06-07-15 but would like a sooner appt. Offered appt tomorrow but she is having cataract surgery and prefers an appt Friday this week. Follow up scheduled with pa, NTG script sent to the pharmacy. Patient voiced understanding to go to the ER for any unrelieved chest pain.

## 2015-05-30 ENCOUNTER — Ambulatory Visit (HOSPITAL_COMMUNITY): Payer: Medicare Other | Admitting: Anesthesiology

## 2015-05-30 ENCOUNTER — Encounter (HOSPITAL_COMMUNITY): Payer: Self-pay

## 2015-05-30 ENCOUNTER — Encounter (HOSPITAL_COMMUNITY)
Admission: RE | Disposition: A | Discharge status: Home / Self Care | Payer: Self-pay | Source: Ambulatory Visit | Attending: Ophthalmology

## 2015-05-30 ENCOUNTER — Ambulatory Visit (HOSPITAL_COMMUNITY)
Admission: RE | Admit: 2015-05-30 | Discharge: 2015-05-30 | Disposition: A | Discharge status: Home / Self Care | Payer: Medicare Other | Source: Ambulatory Visit | Attending: Ophthalmology | Admitting: Ophthalmology

## 2015-05-30 DIAGNOSIS — Z888 Allergy status to other drugs, medicaments and biological substances status: Secondary | ICD-10-CM | POA: Diagnosis not present

## 2015-05-30 DIAGNOSIS — F419 Anxiety disorder, unspecified: Secondary | ICD-10-CM | POA: Insufficient documentation

## 2015-05-30 DIAGNOSIS — Z91040 Latex allergy status: Secondary | ICD-10-CM | POA: Insufficient documentation

## 2015-05-30 DIAGNOSIS — H269 Unspecified cataract: Secondary | ICD-10-CM | POA: Diagnosis not present

## 2015-05-30 DIAGNOSIS — F17200 Nicotine dependence, unspecified, uncomplicated: Secondary | ICD-10-CM | POA: Diagnosis not present

## 2015-05-30 DIAGNOSIS — H25041 Posterior subcapsular polar age-related cataract, right eye: Secondary | ICD-10-CM | POA: Insufficient documentation

## 2015-05-30 DIAGNOSIS — J449 Chronic obstructive pulmonary disease, unspecified: Secondary | ICD-10-CM | POA: Diagnosis not present

## 2015-05-30 DIAGNOSIS — H268 Other specified cataract: Secondary | ICD-10-CM | POA: Diagnosis not present

## 2015-05-30 DIAGNOSIS — Z7982 Long term (current) use of aspirin: Secondary | ICD-10-CM | POA: Diagnosis not present

## 2015-05-30 DIAGNOSIS — Z79899 Other long term (current) drug therapy: Secondary | ICD-10-CM | POA: Insufficient documentation

## 2015-05-30 DIAGNOSIS — H2511 Age-related nuclear cataract, right eye: Secondary | ICD-10-CM | POA: Diagnosis not present

## 2015-05-30 DIAGNOSIS — Z91013 Allergy to seafood: Secondary | ICD-10-CM | POA: Diagnosis not present

## 2015-05-30 DIAGNOSIS — K219 Gastro-esophageal reflux disease without esophagitis: Secondary | ICD-10-CM | POA: Insufficient documentation

## 2015-05-30 DIAGNOSIS — E119 Type 2 diabetes mellitus without complications: Secondary | ICD-10-CM | POA: Insufficient documentation

## 2015-05-30 DIAGNOSIS — Z8673 Personal history of transient ischemic attack (TIA), and cerebral infarction without residual deficits: Secondary | ICD-10-CM | POA: Insufficient documentation

## 2015-05-30 DIAGNOSIS — E7800 Pure hypercholesterolemia, unspecified: Secondary | ICD-10-CM | POA: Insufficient documentation

## 2015-05-30 DIAGNOSIS — Z7984 Long term (current) use of oral hypoglycemic drugs: Secondary | ICD-10-CM | POA: Insufficient documentation

## 2015-05-30 DIAGNOSIS — I10 Essential (primary) hypertension: Secondary | ICD-10-CM | POA: Diagnosis not present

## 2015-05-30 HISTORY — PX: CATARACT EXTRACTION W/PHACO: SHX586

## 2015-05-30 LAB — GLUCOSE, CAPILLARY: Glucose-Capillary: 93 mg/dL (ref 65–99)

## 2015-05-30 SURGERY — PHACOEMULSIFICATION, CATARACT, WITH IOL INSERTION
Anesthesia: Monitor Anesthesia Care | Site: Eye | Laterality: Right

## 2015-05-30 MED ORDER — LACTATED RINGERS IV SOLN
INTRAVENOUS | Status: DC
  Administered 2015-05-30: 09:00:00 via INTRAVENOUS

## 2015-05-30 MED ORDER — LIDOCAINE HCL 3.5 % OP GEL: 1.0000 "application " | Freq: Once | OPHTHALMIC | Status: DC

## 2015-05-30 MED ORDER — MIDAZOLAM HCL 2 MG/2ML IJ SOLN
INTRAMUSCULAR | Status: DC | PRN
  Administered 2015-05-30 (×2): 1 mg via INTRAVENOUS

## 2015-05-30 MED ORDER — PHENYLEPHRINE HCL 2.5 % OP SOLN
1.0000 [drp] | OPHTHALMIC | Status: AC
Start: 2015-05-30 — End: 2015-05-30
  Administered 2015-05-30 (×3): 1 [drp] via OPHTHALMIC

## 2015-05-30 MED ORDER — EPINEPHRINE HCL 1 MG/ML IJ SOLN
INTRAOCULAR | Status: DC | PRN
  Administered 2015-05-30: 500 mL

## 2015-05-30 MED ORDER — MIDAZOLAM HCL 2 MG/2ML IJ SOLN
1.0000 mg | INTRAMUSCULAR | Status: DC | PRN
  Administered 2015-05-30: 2 mg via INTRAVENOUS
  Filled 2015-05-30: qty 2

## 2015-05-30 MED ORDER — TETRACAINE HCL 0.5 % OP SOLN
1.0000 [drp] | OPHTHALMIC | Status: AC
  Administered 2015-05-30 (×3): 1 [drp] via OPHTHALMIC

## 2015-05-30 MED ORDER — KETOROLAC TROMETHAMINE 0.5 % OP SOLN
1.0000 [drp] | OPHTHALMIC | Status: AC
Start: 2015-05-30 — End: 2015-05-30
  Administered 2015-05-30 (×3): 1 [drp] via OPHTHALMIC

## 2015-05-30 MED ORDER — MIDAZOLAM HCL 2 MG/2ML IJ SOLN
INTRAMUSCULAR | Status: AC
  Filled 2015-05-30: qty 2

## 2015-05-30 MED ORDER — CYCLOPENTOLATE-PHENYLEPHRINE 0.2-1 % OP SOLN
1.0000 [drp] | OPHTHALMIC | Status: AC
  Administered 2015-05-30 (×3): 1 [drp] via OPHTHALMIC

## 2015-05-30 MED ORDER — TETRACAINE 0.5 % OP SOLN OPTIME - NO CHARGE
OPHTHALMIC | Status: DC | PRN
  Administered 2015-05-30: 2 [drp] via OPHTHALMIC

## 2015-05-30 MED ORDER — BSS IO SOLN
INTRAOCULAR | Status: DC | PRN
  Administered 2015-05-30: 15 mL

## 2015-05-30 MED ORDER — FENTANYL CITRATE (PF) 100 MCG/2ML IJ SOLN
25.0000 ug | INTRAMUSCULAR | Status: AC
  Administered 2015-05-30 (×2): 25 ug via INTRAVENOUS
  Filled 2015-05-30: qty 2

## 2015-05-30 MED ORDER — PROVISC 10 MG/ML IO SOLN
INTRAOCULAR | Status: DC | PRN
  Administered 2015-05-30: 0.85 mL via INTRAOCULAR

## 2015-05-30 MED ORDER — EPINEPHRINE HCL 1 MG/ML IJ SOLN
INTRAMUSCULAR | Status: AC
  Filled 2015-05-30: qty 1

## 2015-05-30 SURGICAL SUPPLY — 10 items
CLOTH BEACON ORANGE TIMEOUT ST (SAFETY) ×2 IMPLANT
EYE SHIELD UNIVERSAL CLEAR (GAUZE/BANDAGES/DRESSINGS) ×2 IMPLANT
GLOVE BIO SURGEON STRL SZ 6.5 (GLOVE) ×2 IMPLANT
GLOVE BIOGEL PI IND STRL 7.0 (GLOVE) ×1 IMPLANT
GLOVE BIOGEL PI INDICATOR 7.0 (GLOVE) ×1
LENS ALC ACRYL/TECN (Ophthalmic Related) ×2 IMPLANT
PAD ARMBOARD 7.5X6 YLW CONV (MISCELLANEOUS) ×2 IMPLANT
TAPE SURG TRANSPORE 1 IN (GAUZE/BANDAGES/DRESSINGS) ×1 IMPLANT
TAPE SURGICAL TRANSPORE 1 IN (GAUZE/BANDAGES/DRESSINGS) ×1
WATER STERILE IRR 250ML POUR (IV SOLUTION) ×2 IMPLANT

## 2015-05-30 NOTE — Anesthesia Procedure Notes (Signed)
Procedure Name: MAC Date/Time: 05/30/2015 9:10 AM Performed by: Vista Deck Pre-anesthesia Checklist: Patient identified, Emergency Drugs available, Suction available, Timeout performed and Patient being monitored Patient Re-evaluated:Patient Re-evaluated prior to inductionOxygen Delivery Method: Nasal Cannula

## 2015-05-30 NOTE — Anesthesia Postprocedure Evaluation (Signed)
  Anesthesia Post-op Note  Patient: [name]  Procedure(s) Performed: Procedure(s) (LRB): CATARACT EXTRACTION RIGHT EYE PHACO AND INTRAOCULAR LENS PLACEMENT  (Right)  Patient Location:  Short Stay  Anesthesia Type: MAC  Level of Consciousness: awake  Airway and Oxygen Therapy: Patient Spontanous Breathing  Post-op Pain: none  Post-op Assessment: Post-op Vital signs reviewed, Patient's Cardiovascular Status Stable, Respiratory Function Stable, Patent Airway, No signs of Nausea or vomiting and Pain level controlled  Post-op Vital Signs: Reviewed and stable  Complications: No apparent anesthesia complications

## 2015-05-30 NOTE — Op Note (Signed)
Patient brought to the operating room and prepped and draped in the usual manner.  Lid speculum inserted in right eye.  Stab incision made at the twelve o'clock position.  Provisc instilled in the anterior chamber.   A 2.4 mm. Stab incision was made temporally.  An anterior capsulotomy was done with a bent 25 gauge needle.  The nucleus was hydrodissected.  The Phaco tip was inserted in the anterior chamber and the nucleus was emulsified.  CDE was 4.08.  The cortical material was then removed with the I and A tip.  Posterior capsule was the polished.  The anterior chamber was deepened with Provisc.  A 20.5 Diopter Alcon [id number] IOL was then inserted in the capsular bag.  Provisc was then removed with the I and A tip.  The wound was then hydrated.  Patient sent to the Recovery Room in good condition with follow up in my office.  Preoperative Diagnosis:  Nuclear and PSC Cataract OD Postoperative Diagnosis:  Same Procedure name: Kelman Phacoemulsification OD with IOL

## 2015-05-30 NOTE — Discharge Instructions (Signed)
°  °          [facility] Instructions °[address]- [place] [address]-[place] °    ° °1. Avoid closing eyes tightly. One often closes the eye tightly when laughing, talking, sneezing, coughing or if they feel irritated. At these times, you should be careful not to close your eyes tightly. ° °2. Instill eye drops as instructed. To instill drops in your eye, open it, look up and have someone gently pull the lower lid down and instill a couple of drops inside the lower lid. ° °3. Do not touch upper lid. ° °4. Take Advil or Tylenol for pain. ° °5. You may use either eye for near work, such as reading or sewing and you may watch television. ° °6. You may have your hair done at the beauty parlor at any time. ° °7. Wear dark glasses with or without your own glasses if you are in bright light. ° °8. Call our office at [phone] or [phone] if you have sharp pain in your eye or unusual symptoms. ° °9.  FOLLOW UP WITH DR. SHAPIRO TODAY IN HIS [place] OFFICE AT 2:45pm. ° °  °I have received a copy of the above instructions and will follow them.  ° ° ° °IF YOU ARE IN IMMEDIATE DANGER CALL 911! ° °It is important for you to keep your follow-up appointment with your physician after discharge, OR, for you /your caregiver to make a follow-up appointment with your physician / medical provider after discharge. ° °Show these instructions to the next healthcare provider you see. ° °

## 2015-05-30 NOTE — Transfer of Care (Signed)
Immediate Anesthesia Transfer of Care Note  Patient: [name]  Procedure(s) Performed: Procedure(s) (LRB): CATARACT EXTRACTION RIGHT EYE PHACO AND INTRAOCULAR LENS PLACEMENT  (Right)  Patient Location: Shortstay  Anesthesia Type: MAC  Level of Consciousness: awake  Airway & Oxygen Therapy: Patient Spontanous Breathing   Post-op Assessment: Report given to PACU RN, Post -op Vital signs reviewed and stable and Patient moving all extremities  Post vital signs: Reviewed and stable  Complications: No apparent anesthesia complications

## 2015-05-30 NOTE — Anesthesia Preprocedure Evaluation (Signed)
Anesthesia Evaluation  Patient identified by MRN, date of birth, ID band Patient awake    Reviewed: Allergy & Precautions, H&P , NPO status , Patient's Chart, lab work & pertinent test results  Airway Mallampati: II  TM Distance: >3 FB Neck ROM: full    Dental  (+) Dental Advidsory Given, Edentulous Lower, Edentulous Upper   Pulmonary asthma , COPD, Current Smoker,    breath sounds clear to auscultation       Cardiovascular hypertension, Pt. on medications + angina with exertion + Past MI and + Peripheral Vascular Disease   Rhythm:Regular Rate:Normal  Mild CAD on cath   Neuro/Psych  Headaches, PSYCHIATRIC DISORDERS Anxiety CVA    GI/Hepatic hiatal hernia, PUD, GERD  ,  Endo/Other  diabetes, Type 2  Renal/GU      Musculoskeletal   Abdominal   Peds  Hematology  (+) anemia ,   Anesthesia Other Findings   Reproductive/Obstetrics                             Anesthesia Physical Anesthesia Plan  ASA: III  Anesthesia Plan: MAC   Post-op Pain Management:    Induction: Intravenous  Airway Management Planned: Nasal Cannula  Additional Equipment:   Intra-op Plan:   Post-operative Plan:   Informed Consent: I have reviewed the patients History and Physical, chart, labs and discussed the procedure including the risks, benefits and alternatives for the proposed anesthesia with the patient or authorized representative who has indicated his/her understanding and acceptance.     Plan Discussed with:   Anesthesia Plan Comments:         Anesthesia Quick Evaluation

## 2015-05-30 NOTE — H&P (Signed)
The patient was re examined and there is no change in the patients condition since the original H and P. 

## 2015-05-31 ENCOUNTER — Other Ambulatory Visit: Payer: Self-pay | Admitting: Cardiovascular Disease

## 2015-05-31 ENCOUNTER — Encounter (HOSPITAL_COMMUNITY): Payer: Self-pay | Admitting: Ophthalmology

## 2015-05-31 DIAGNOSIS — I739 Peripheral vascular disease, unspecified: Secondary | ICD-10-CM

## 2015-06-01 NOTE — Progress Notes (Signed)
This encounter was created in error - please disregard.

## 2015-06-02 ENCOUNTER — Encounter: Payer: Medicare Other | Admitting: Physician Assistant

## 2015-06-07 ENCOUNTER — Ambulatory Visit: Payer: Medicare Other | Admitting: Physician Assistant

## 2015-06-07 ENCOUNTER — Ambulatory Visit (HOSPITAL_COMMUNITY)
Admission: RE | Admit: 2015-06-07 | Discharge: 2015-06-07 | Disposition: A | Discharge status: Home / Self Care | Payer: Medicare Other | Source: Ambulatory Visit | Attending: Cardiovascular Disease | Admitting: Cardiovascular Disease

## 2015-06-07 DIAGNOSIS — I739 Peripheral vascular disease, unspecified: Secondary | ICD-10-CM | POA: Diagnosis not present

## 2015-06-09 ENCOUNTER — Telehealth: Payer: Self-pay | Admitting: Cardiovascular Disease

## 2015-06-09 NOTE — Telephone Encounter (Signed)
Follow Up ° °Pt returned call//  °

## 2015-06-09 NOTE — Telephone Encounter (Signed)
Follow Up  Pt called back to be sure she had the right number down. Please call mobile

## 2015-06-20 ENCOUNTER — Ambulatory Visit: Payer: Medicare Other | Admitting: Physician Assistant

## 2015-06-21 ENCOUNTER — Ambulatory Visit (INDEPENDENT_AMBULATORY_CARE_PROVIDER_SITE_OTHER): Payer: Medicare Other | Admitting: Cardiovascular Disease

## 2015-06-21 ENCOUNTER — Encounter: Payer: Self-pay | Admitting: Cardiovascular Disease

## 2015-06-21 VITALS — BP 114/72 | HR 96 | Ht 64.0 in | Wt 128.0 lb

## 2015-06-21 DIAGNOSIS — E559 Vitamin D deficiency, unspecified: Secondary | ICD-10-CM | POA: Diagnosis not present

## 2015-06-21 DIAGNOSIS — R079 Chest pain, unspecified: Secondary | ICD-10-CM

## 2015-06-21 DIAGNOSIS — I10 Essential (primary) hypertension: Secondary | ICD-10-CM | POA: Diagnosis not present

## 2015-06-21 DIAGNOSIS — I739 Peripheral vascular disease, unspecified: Secondary | ICD-10-CM

## 2015-06-21 DIAGNOSIS — E1140 Type 2 diabetes mellitus with diabetic neuropathy, unspecified: Secondary | ICD-10-CM | POA: Diagnosis not present

## 2015-06-21 DIAGNOSIS — E782 Mixed hyperlipidemia: Secondary | ICD-10-CM | POA: Diagnosis not present

## 2015-06-21 NOTE — Assessment & Plan Note (Signed)
This block will have skilled complained of substernal chest pain which is nitrate responsive. She really did have a cardiac catheterization over 10 years ago by Dr. Janalyn Rouse and was told she had "blockages".she does have multiple cardiac risk factors and continues to smoke. I'm going to get an exercise Myoview stress test to rule out ischemic etiology. Her last Myoview performed 07/07/11 was nonischemic.

## 2015-06-21 NOTE — Assessment & Plan Note (Signed)
History of hypertension blood pressure measured at 114/72. She is on Olmasatrten, amlodipine, hydrochlorothiazide.. Continue current meds at current dosing

## 2015-06-21 NOTE — Assessment & Plan Note (Signed)
History of peripheral arterial disease status post angiography by myself 11/03/14 revealing a long segment occlusion of her mid left SFA which I stented using a Viabahn stent graft. She did have 50% segmental mid right SFA stenosis and three-vessel runoff bilaterally. Her most recent Dopplers performed 06/07/15 revealed a decline in her left ABI from 1.1-0.91 with an increase in her mid left SFA velocities although she really denies claudication. We will continue to monitor this noninvasively. I concerned that she may have progression of disease at the "edges" of her stent.

## 2015-06-21 NOTE — Patient Instructions (Addendum)
Medication Instructions:  Your physician recommends that you continue on your current medications as directed. Please refer to the Current Medication list given to you today.   Labwork: I will get your lab work from your Primary Care Physician.   Testing/Procedures: Your physician has requested that you have a lexiscan myoview. For further information please visit [email or web address]. Please follow instruction sheet, as given.   Your physician has requested that you have a lower extremity arterial doppler- During this test, ultrasound is used to evaluate arterial blood flow in the legs. Allow approximately one hour for this exam. - IN 6 MONTHS   Follow-Up: Your physician wants you to follow-up in: 6 months with Dr. Gwenlyn Found. You will receive a reminder letter in the mail two months in advance. If you don't receive a letter, please call our office to schedule the follow-up appointment.    Any Other Special Instructions Will Be Listed Below (If Applicable).     If you need a refill on your cardiac medications before your next appointment, please call your pharmacy.

## 2015-06-21 NOTE — Progress Notes (Signed)
06/21/2015 [name]   10/28/1955  [id number]  Primary Physician Wende Neighbors, MD Primary Cardiologist: Lorretta Harp MD [name]   HPI:  [name] is a very pleasant 60 year old thin-appearing divorced African-American female mother of one, grandmother of one grandchild who is currently disabled because of a prior stroke. She was referred by Dr. Melony Overly, from [facility] podiatry, for evaluation and treatment of critical limb ischemia. I last saw her in the office 05/09/15. Her cardiovascular risk factor profile is notable for a strong family history of heart disease with the father about a stent, mother who had bypass surgery and a sister who died at age 63 of a myocardial infarction. She has never had a heart attack but apparently has had a stroke in the past with some mild left-sided residua. She has a history of tobacco abuse in the last 43 years of one third pack per day trying to quit currently. She has treated diabetes, hypertension and hyperlipidemia. She had the onset of left ear pain approximately 3 months ago with progression to critical limb ischemia in early June with ischemic appearing left fourth toe. Dopplers in our office performed yesterday revealed a left ABI 0.6 with an occluded left SFA and one-vessel runoff. She will need to be admitted for angiography and potential endovascular therapy for critical limb ischemia. Angiogram to her on 11/03/14 revealing occluded left SFA. I performed [facility] one directional atherectomy, PTA and stenting using a Viabahn covered stent with an excellent angiographic and clinical result. Her pain has resolved. Her critical limb ischemia has resolved. Her Dopplers have normalized. Since I saw her approximately 6 weeks ago she's had 4 episodes of night nitroglycerin responsive chest pain. Recent Dopplers did show a decline in her left ABI from 1.1 6 months ago to .91 with a simultaneous increase in velocity in her mid left  SFA.  Current Outpatient Prescriptions  Medication Sig Dispense Refill  . acetaminophen (TYLENOL) 325 MG tablet Take 2 tablets (650 mg total) by mouth every 4 (four) hours as needed for headache or mild pain.    Marland Kitchen albuterol (PROAIR HFA) 108 (90 BASE) MCG/ACT inhaler Inhale 2 puffs into the lungs every 6 (six) hours as needed for wheezing or shortness of breath.    . ALPRAZolam (XANAX) 0.5 MG tablet Take 0.25-0.5 mg by mouth 2 (two) times daily as needed for anxiety.     Marland Kitchen aspirin EC 325 MG EC tablet Take 1 tablet (325 mg total) by mouth daily. 30 tablet 0  . b complex vitamins tablet Take 1 tablet by mouth daily.    . Bromfenac Sodium (PROLENSA) 0.07 % SOLN Apply to eye.    . Calcium Carb-Cholecalciferol (CALCIUM 600 + D PO) Take 1 tablet by mouth daily.    . celecoxib (CELEBREX) 200 MG capsule Take 200 mg by mouth daily as needed for mild pain.   1  . clopidogrel (PLAVIX) 75 MG tablet Take 75 mg by mouth at bedtime.     . Dulaglutide (TRULICITY) A999333 0000000 SOPN Inject 1 Units into the skin once a week.    Marland Kitchen ibuprofen (ADVIL,MOTRIN) 800 MG tablet Take 800 mg by mouth every 8 (eight) hours as needed.    . insulin aspart (NOVOLOG) 100 UNIT/ML injection Inject 5 Units into the skin 3 (three) times daily before meals.    . insulin detemir (LEVEMIR) 100 UNIT/ML injection Inject 18 Units into the skin at bedtime.    Marland Kitchen loratadine (CLARITIN) 10 MG tablet  Take 10 mg by mouth daily as needed for allergies.    . mometasone (NASONEX) 50 MCG/ACT nasal spray Place 1 spray into the nose daily as needed (allergies).     . mupirocin cream (BACTROBAN) 2 % Apply 1 application topically 2 (two) times daily as needed (wound care (boils)).     . nitroGLYCERIN (NITRODUR - DOSED IN MG/24 HR) 0.4 mg/hr Place 0.4 mg onto the skin as needed (chest pain).     . nitroGLYCERIN (NITROSTAT) 0.4 MG SL tablet Place 1 tablet (0.4 mg total) under the tongue every 5 (five) minutes as needed for chest pain. Maximum 3 doses 25  tablet 12  . OFLOXACIN PO Take by mouth.    . Olmesartan-Amlodipine-HCTZ (TRIBENZOR) 40-5-12.5 MG TABS Take 1 tablet by mouth daily.     . ONE TOUCH ULTRA TEST test strip     . pantoprazole (PROTONIX) 40 MG tablet Take 1 tablet (40 mg total) by mouth daily. 30 tablet 11  . potassium chloride SA (K-DUR,KLOR-CON) 20 MEQ tablet Take 20 mEq by mouth daily as needed (for cramps).    . rosuvastatin (CRESTOR) 20 MG tablet Take 20 mg by mouth at bedtime.     . SUMAtriptan (IMITREX) 50 MG tablet Take 50 mg by mouth every 2 (two) hours as needed for migraine.    Marland Kitchen zolpidem (AMBIEN) 10 MG tablet Take 5 mg by mouth at bedtime as needed for sleep.     No current facility-administered medications for this visit.    Allergies  Allergen Reactions  . Bactrim [Sulfamethoxazole-Trimethoprim] Shortness Of Breath  . Sulfa Antibiotics Shortness Of Breath and Palpitations  . Fish Allergy Hives and Swelling    Tongue swelling  . Iodine Swelling  . Metformin And Related Diarrhea  . Adhesive [Tape] Rash    Paper tape is ok  . Shellfish Allergy Swelling and Rash    Tongue swelling    Social History   Social History  . Marital Status: Divorced    Spouse Name: N/A  . Number of Children: 1  . Years of Education: N/A   Occupational History  .     Social History Main Topics  . Smoking status: Current Every Day Smoker -- 0.33 packs/day for 41 years    Types: Cigarettes  . Smokeless tobacco: Never Used  . Alcohol Use: No  . Drug Use: No     Comment: 05/09/2015.  "quit 07/27/1994"  . Sexual Activity: Not Currently    Birth Control/ Protection: Abstinence   Other Topics Concern  . Not on file   Social History Narrative     Review of Systems: General: negative for chills, fever, night sweats or weight changes.  Cardiovascular: negative for chest pain, dyspnea on exertion, edema, orthopnea, palpitations, paroxysmal nocturnal dyspnea or shortness of breath Dermatological: negative for  rash Respiratory: negative for cough or wheezing Urologic: negative for hematuria Abdominal: negative for nausea, vomiting, diarrhea, bright red blood per rectum, melena, or hematemesis Neurologic: negative for visual changes, syncope, or dizziness All other systems reviewed and are otherwise negative except as noted above.    Blood pressure 114/72, pulse 96, height 5\' 4"  (1.626 m), weight 128 lb (58.06 kg).  General appearance: alert and no distress Neck: no adenopathy, no carotid bruit, no JVD, supple, symmetrical, trachea midline and thyroid not enlarged, symmetric, no tenderness/mass/nodules Lungs: clear to auscultation bilaterally Heart: regular rate and rhythm, S1, S2 normal, no murmur, click, rub or gallop Extremities: extremities normal, atraumatic, no cyanosis or edema  EKG normal sinus rhythm at 96 with septal Q waves. I personally reviewed his EKG  ASSESSMENT AND PLAN:   Hyperlipidemia LDL goal <70 History of hyperlipidemia on statin therapy followed by her PCP  Essential hypertension History of hypertension blood pressure measured at 114/72. She is on Olmasatrten, amlodipine, hydrochlorothiazide.. Continue current meds at current dosing  Chest pain at rest This block will have skilled complained of substernal chest pain which is nitrate responsive. She really did have a cardiac catheterization over 10 years ago by Dr. Janalyn Rouse and was told she had "blockages".she does have multiple cardiac risk factors and continues to smoke. I'm going to get an exercise Myoview stress test to rule out ischemic etiology. Her last Myoview performed 07/07/11 was nonischemic.  Peripheral arterial disease History of peripheral arterial disease status post angiography by myself 11/03/14 revealing a long segment occlusion of her mid left SFA which I stented using a Viabahn stent graft. She did have 50% segmental mid right SFA stenosis and three-vessel runoff bilaterally. Her most recent Dopplers  performed 06/07/15 revealed a decline in her left ABI from 1.1-0.91 with an increase in her mid left SFA velocities although she really denies claudication. We will continue to monitor this noninvasively. I concerned that she may have progression of disease at the "edges" of her stent.      Lorretta Harp MD FACP,FACC,FAHA, [facility] 06/21/2015 11:27 AM

## 2015-06-21 NOTE — Assessment & Plan Note (Signed)
History of hyperlipidemia on statin therapy followed by her PCP. 

## 2015-06-28 DIAGNOSIS — E782 Mixed hyperlipidemia: Secondary | ICD-10-CM | POA: Diagnosis not present

## 2015-06-28 DIAGNOSIS — E1140 Type 2 diabetes mellitus with diabetic neuropathy, unspecified: Secondary | ICD-10-CM | POA: Diagnosis not present

## 2015-06-28 DIAGNOSIS — G589 Mononeuropathy, unspecified: Secondary | ICD-10-CM | POA: Diagnosis not present

## 2015-06-28 DIAGNOSIS — I2510 Atherosclerotic heart disease of native coronary artery without angina pectoris: Secondary | ICD-10-CM | POA: Diagnosis not present

## 2015-06-28 DIAGNOSIS — Z720 Tobacco use: Secondary | ICD-10-CM | POA: Diagnosis not present

## 2015-06-30 ENCOUNTER — Telehealth (HOSPITAL_COMMUNITY): Payer: Self-pay

## 2015-06-30 NOTE — Telephone Encounter (Signed)
Encounter complete. 

## 2015-07-05 ENCOUNTER — Ambulatory Visit (HOSPITAL_COMMUNITY)
Admission: RE | Admit: 2015-07-05 | Discharge: 2015-07-05 | Disposition: A | Discharge status: Home / Self Care | Payer: Medicare Other | Source: Ambulatory Visit | Attending: Cardiovascular Disease | Admitting: Cardiovascular Disease

## 2015-07-05 DIAGNOSIS — Z8249 Family history of ischemic heart disease and other diseases of the circulatory system: Secondary | ICD-10-CM | POA: Diagnosis not present

## 2015-07-05 DIAGNOSIS — F17200 Nicotine dependence, unspecified, uncomplicated: Secondary | ICD-10-CM | POA: Insufficient documentation

## 2015-07-05 DIAGNOSIS — R079 Chest pain, unspecified: Secondary | ICD-10-CM | POA: Insufficient documentation

## 2015-07-05 DIAGNOSIS — I10 Essential (primary) hypertension: Secondary | ICD-10-CM | POA: Diagnosis not present

## 2015-07-05 DIAGNOSIS — R42 Dizziness and giddiness: Secondary | ICD-10-CM | POA: Diagnosis not present

## 2015-07-05 DIAGNOSIS — E119 Type 2 diabetes mellitus without complications: Secondary | ICD-10-CM | POA: Insufficient documentation

## 2015-07-05 DIAGNOSIS — R0609 Other forms of dyspnea: Secondary | ICD-10-CM | POA: Insufficient documentation

## 2015-07-05 DIAGNOSIS — I739 Peripheral vascular disease, unspecified: Secondary | ICD-10-CM | POA: Insufficient documentation

## 2015-07-05 LAB — MYOCARDIAL PERFUSION IMAGING
LV dias vol: 81 mL
LV sys vol: 34 mL
Peak HR: 96 {beats}/min
Rest HR: 61 {beats}/min
SDS: 2
SRS: 1
SSS: 3
TID: 1.22

## 2015-07-05 MED ORDER — TECHNETIUM TC 99M SESTAMIBI GENERIC - CARDIOLITE
10.8000 | Freq: Once | INTRAVENOUS | Status: AC | PRN
  Administered 2015-07-05: 10.8 via INTRAVENOUS

## 2015-07-05 MED ORDER — AMINOPHYLLINE 25 MG/ML IV SOLN
75.0000 mg | Freq: Once | INTRAVENOUS | Status: AC
  Administered 2015-07-05: 75 mg via INTRAVENOUS

## 2015-07-05 MED ORDER — REGADENOSON 0.4 MG/5ML IV SOLN
0.4000 mg | Freq: Once | INTRAVENOUS | Status: AC
  Administered 2015-07-05: 0.4 mg via INTRAVENOUS

## 2015-07-05 MED ORDER — TECHNETIUM TC 99M SESTAMIBI GENERIC - CARDIOLITE
30.1000 | Freq: Once | INTRAVENOUS | Status: AC | PRN
  Administered 2015-07-05: 30.1 via INTRAVENOUS

## 2015-07-10 ENCOUNTER — Encounter: Payer: Self-pay | Admitting: Allergy and Immunology

## 2015-07-10 ENCOUNTER — Ambulatory Visit (INDEPENDENT_AMBULATORY_CARE_PROVIDER_SITE_OTHER): Payer: Medicare Other | Admitting: Allergy and Immunology

## 2015-07-10 VITALS — BP 100/60 | HR 96 | Temp 98.2°F | Resp 20 | Ht 63.58 in | Wt 131.2 lb

## 2015-07-10 DIAGNOSIS — Z8709 Personal history of other diseases of the respiratory system: Secondary | ICD-10-CM | POA: Diagnosis not present

## 2015-07-10 DIAGNOSIS — L500 Allergic urticaria: Secondary | ICD-10-CM | POA: Insufficient documentation

## 2015-07-10 DIAGNOSIS — T783XXA Angioneurotic edema, initial encounter: Secondary | ICD-10-CM | POA: Diagnosis not present

## 2015-07-10 DIAGNOSIS — J3089 Other allergic rhinitis: Secondary | ICD-10-CM | POA: Insufficient documentation

## 2015-07-10 DIAGNOSIS — K2970 Gastritis, unspecified, without bleeding: Secondary | ICD-10-CM

## 2015-07-10 DIAGNOSIS — J309 Allergic rhinitis, unspecified: Secondary | ICD-10-CM | POA: Insufficient documentation

## 2015-07-10 HISTORY — DX: Angioneurotic edema, initial encounter: T78.3XXA

## 2015-07-10 HISTORY — DX: Allergic urticaria: L50.0

## 2015-07-10 MED ORDER — ALBUTEROL SULFATE HFA 108 (90 BASE) MCG/ACT IN AERS: INHALATION_SPRAY | RESPIRATORY_TRACT | Status: AC

## 2015-07-10 MED ORDER — RANITIDINE HCL 150 MG PO TABS: ORAL_TABLET | ORAL | Status: DC

## 2015-07-10 MED ORDER — MOMETASONE FUROATE 50 MCG/ACT NA SUSP: NASAL | Status: DC

## 2015-07-10 MED ORDER — AZELASTINE HCL 0.15 % NA SOLN: 2.0000 | Freq: Two times a day (BID) | NASAL | Status: AC

## 2015-07-10 NOTE — Assessment & Plan Note (Addendum)
   Aeroallergen avoidance measures have been discussed and provided in written form.  A prescription has been provided for azelastine nasal spray, 1-2 sprays per nostril 2 times daily as needed. Proper nasal spray technique has been discussed and demonstrated.   If needed, add Nasonex one spray per nostril 1-2 times daily.  Nasal saline lavage (NeilMed) as needed has been recommended along with instructions for proper administration.  As cigarette smoke acts a potent irritant to the nasal/sinus mucosa, tobacco cessation has been discussed and encouraged.

## 2015-07-10 NOTE — Assessment & Plan Note (Addendum)
[name]'s history suggests the possibility of seafood allergy, however food allergen skin tests today were negative but a positive histamine control.  The negative predictive value for food allergen skin tests are excellent, however there is still a 5% chance that the allergy exists.  Therefore, these results will be confirmed with serum specific IgE levels. NSAIDs and emotional stress commonly exacerbate urticaria but do not appear to be the underlying etiology in this case. Physical urticarias are negative by history (i.e. pressure-induced, temperature, vibration, solar, etc.).  There are no concomitant symptoms concerning for anaphylaxis or constitutional symptoms worrisome for an underlying malignancy. We will rule out other potential etiologies with labs.   The following labs have been ordered: FCeRI antibody, TSH, anti-thyroglobulin antibody, thyroid peroxidase antibody, tryptase, urea breath test, ESR, ANA, galactose-alpha-1,3-galactose IgE level, and serum specific IgE against shellfish panel and fish panel.  The patient will be called with further recommendations after lab results have returned.  Instructions have been provided and discussed for H1/H2 receptor blockade with titration to find lowest effective dose.  Should symptoms recur, a journal is to be kept recording any foods eaten, beverages consumed, medications taken within a 6 hour period prior to the onset of symptoms, as well as record activities being performed, and environmental conditions. For any symptoms concerning for anaphylaxis, 911 is to be called immediately.

## 2015-07-10 NOTE — Patient Instructions (Addendum)
Recurrent urticaria [name]'s history suggests the possibility of seafood allergy, however food allergen skin tests today were negative but a positive histamine control.  The negative predictive value for food allergen skin tests are excellent, however there is still a 5% chance that the allergy exists.  Therefore, these results will be confirmed with serum specific IgE levels. NSAIDs and emotional stress commonly exacerbate urticaria but do not appear to be the underlying etiology in this case. Physical urticarias are negative by history (i.e. pressure-induced, temperature, vibration, solar, etc.).  There are no concomitant symptoms concerning for anaphylaxis or constitutional symptoms worrisome for an underlying malignancy. We will rule out other potential etiologies with labs.   The following labs have been ordered: FCeRI antibody, TSH, anti-thyroglobulin antibody, thyroid peroxidase antibody, tryptase, urea breath test, ESR, ANA, galactose-alpha-1,3-galactose IgE level, and serum specific IgE against shellfish panel and fish panel.  The patient will be called with further recommendations after lab results have returned.  Instructions have been provided and discussed for H1/H2 receptor blockade with titration to find lowest effective dose.  Should symptoms recur, a journal is to be kept recording any foods eaten, beverages consumed, medications taken within a 6 hour period prior to the onset of symptoms, as well as record activities being performed, and environmental conditions. For any symptoms concerning for anaphylaxis, 911 is to be called immediately.  Angioedema Associated angioedema occurs in up to 50% of patients with chronic or recurrent urticaria.  Treatment/diagnostic plan as outlined above.  Perennial allergic rhinitis with a possible non-allergic component  Aeroallergen avoidance measures have been discussed and provided in written form.  A prescription has been provided for azelastine  nasal spray, 1-2 sprays per nostril 2 times daily as needed. Proper nasal spray technique has been discussed and demonstrated.   If needed, add Nasonex one spray per nostril 1-2 times daily.  Nasal saline lavage (NeilMed) as needed has been recommended along with instructions for proper administration.  As cigarette smoke acts a potent irritant to the nasal/sinus mucosa, tobacco cessation has been discussed and encouraged.  History of asthma Lower respiratory symptoms are rare and spirometry is normal today.  Tobacco cessation has been discussed and encouraged.  Continue albuterol HFA, 1-2 inhalations every 4-6 hours as needed.  Subjective and objective measures of pulmonary function will be followed and the treatment plan will be adjusted accordingly.   When lab results have returned the patient will be called with further recommendations and follow up instructions .    Urticaria (Hives)  . Cetirizine (Zyrtec) 53m once a day.  If symptoms continue then increase to .  .Marland KitchenCetirizine (Zyrtec) 150m twice a day.  If symptoms continue then increase to .  . Marland Kitchenetirizine (Zyrtec) 1076mtwice a day and Ranitidine (Zantac) 150 mg once a day.  If symptoms continue then increase to.  . Cetirizine (Zyrtec) 73m36mwice a day and Ranitidine (Zantac) 150 mg twice a day  May use Benadryl as needed for breakthrough symptoms       If no symptoms for 7 days, then step down dosage  Control of Mold Allergen  Mold and fungi can grow on a variety of surfaces provided certain temperature and moisture conditions exist.  Outdoor molds grow on plants, decaying vegetation and soil.  The major outdoor mold, Alternaria and Cladosporium, are found in very high numbers during hot and dry conditions.  Generally, a late Summer - Fall peak is seen for common outdoor fungal spores.  Rain will temporarily  lower outdoor mold spore count, but counts rise rapidly when the rainy period ends.  The most important indoor  molds are Aspergillus and Penicillium.  Dark, humid and poorly ventilated basements are ideal sites for mold growth.  The next most common sites of mold growth are the bathroom and the kitchen.  Outdoor [employer] 3. Use air conditioning and keep windows closed 4. Avoid exposure to decaying vegetation. 5. Avoid leaf raking. 6. Avoid grain handling. 7. Consider wearing a face mask if working in moldy areas.  Indoor Mold Control 1. Maintain humidity below 50%. 2. Clean washable surfaces with 5% bleach solution. 3. Remove sources e.g. Contaminated carpets.  Control of House Dust Mite Allergen  House dust mites play a major role in allergic asthma and rhinitis.  They occur in environments with high humidity wherever human skin, the food for dust mites is found. High levels have been detected in dust obtained from mattresses, pillows, carpets, upholstered furniture, bed covers, clothes and soft toys.  The principal allergen of the house dust mite is found in its feces.  A gram of dust may contain 1,000 mites and 250,000 fecal particles.  Mite antigen is easily measured in the air during house cleaning activities.    1. Encase mattresses, including the box spring, and pillow, in an air tight cover.  Seal the zipper end of the encased mattresses with wide adhesive tape. 2. Wash the bedding in water of 130 degrees Farenheit weekly.  Avoid cotton comforters/quilts and flannel bedding: the most ideal bed covering is the dacron comforter. 3. Remove all upholstered furniture from the bedroom. 4. Remove carpets, carpet padding, rugs, and non-washable window drapes from the bedroom.  Wash drapes weekly or use plastic window coverings. 5. Remove all non-washable stuffed toys from the bedroom.  Wash stuffed toys weekly. 6. Have the room cleaned frequently with a vacuum cleaner and a damp dust-mop.  The patient should not be in a room which is being cleaned and should wait 1 hour after cleaning before going  into the room. 7. Close and seal all heating outlets in the bedroom.  Otherwise, the room will become filled with dust-laden air.  An electric heater can be used to heat the room. 8. Reduce indoor humidity to less than 50%.  Do not use a humidifier.

## 2015-07-10 NOTE — Progress Notes (Signed)
New Patient Note  RE: [name] MRN: [id number] DOB: [birth year] Date of Office Visit: 07/10/2015  Referring provider: [name], MD Primary care provider: Wende Neighbors, MD  Chief Complaint: Allergic Reaction and Nasal Congestion   History of present illness: HPI Comments: [name] is a 59 y.o. female with a complex medical history who presents today for consultation of possible food allergies as well as rhinitis.  She reports that in October 2011 she consumed steak and shrimp for dinner and later that night woke up with hives on her hands, back, legs, and feet.  In addition, she had swelling of the left hand.  She did not experience concomitant cardiopulmonary or GI symptoms.  The symptoms resolved within hours.  In gain or 2012, she consumed chicken which was cooked in fish grease and shortly thereafter developed angioedema of the tongue and feet as well as hives on her feet.  She has meticulously avoided seafood since that time. [name] complains of nasal congestion "all the time", snoring, and occasional rhinorrhea.  The symptoms occur year round but tend be worse during times of rapid weather change.  She has had tonsillectomy, adenoidectomy, and uvulectomy without perceived symptom reduction.  She has a history of asthma but reports that she has not experienced significant lower respiratory symptoms over the past 4 or 5 years.  She has smoked cigarettes over the past 45 years, half pack per day on average, and is currently attempting to quit.   Assessment and plan: Recurrent urticaria Mae's history suggests the possibility of seafood allergy, however food allergen skin tests today were negative but a positive histamine control.  The negative predictive value for food allergen skin tests are excellent, however there is still a 5% chance that the allergy exists.  Therefore, these results will be confirmed with serum specific IgE levels. NSAIDs and emotional stress commonly exacerbate  urticaria but do not appear to be the underlying etiology in this case. Physical urticarias are negative by history (i.e. pressure-induced, temperature, vibration, solar, etc.).  There are no concomitant symptoms concerning for anaphylaxis or constitutional symptoms worrisome for an underlying malignancy. We will rule out other potential etiologies with labs.   The following labs have been ordered: FCeRI antibody, TSH, anti-thyroglobulin antibody, thyroid peroxidase antibody, tryptase, urea breath test, ESR, ANA, galactose-alpha-1,3-galactose IgE level, and serum specific IgE against shellfish panel and fish panel.  The patient will be called with further recommendations after lab results have returned.  Instructions have been provided and discussed for H1/H2 receptor blockade with titration to find lowest effective dose.  Should symptoms recur, a journal is to be kept recording any foods eaten, beverages consumed, medications taken within a 6 hour period prior to the onset of symptoms, as well as record activities being performed, and environmental conditions. For any symptoms concerning for anaphylaxis, 911 is to be called immediately.  Angioedema Associated angioedema occurs in up to 50% of patients with chronic or recurrent urticaria.  Treatment/diagnostic plan as outlined above.  Perennial allergic rhinitis with a possible non-allergic component  Aeroallergen avoidance measures have been discussed and provided in written form.  A prescription has been provided for azelastine nasal spray, 1-2 sprays per nostril 2 times daily as needed. Proper nasal spray technique has been discussed and demonstrated.   If needed, add Nasonex one spray per nostril 1-2 times daily.  Nasal saline lavage (NeilMed) as needed has been recommended along with instructions for proper administration.  As cigarette smoke acts a  potent irritant to the nasal/sinus mucosa, tobacco cessation has been discussed and  encouraged.  History of asthma Lower respiratory symptoms are rare and spirometry is normal today.  Tobacco cessation has been discussed and encouraged.  Continue albuterol HFA, 1-2 inhalations every 4-6 hours as needed.  Subjective and objective measures of pulmonary function will be followed and the treatment plan will be adjusted accordingly.    Meds ordered this encounter  Medications  . Azelastine HCl 0.15 % SOLN    Sig: Place 2 sprays into both nostrils 2 (two) times daily.    Dispense:  30 mL    Refill:  5  . albuterol (PROAIR HFA) 108 (90 Base) MCG/ACT inhaler    Sig: 1-2 inhalations every 4-6 hours as needed for cough or wheeze.    Dispense:  1 Inhaler    Refill:  1  . mometasone (NASONEX) 50 MCG/ACT nasal spray    Sig: 1-2 sprays in each nostril once daily for stuffy nose or drainage.    Dispense:  17 g    Refill:  5  . ranitidine (ZANTAC) 150 MG tablet    Sig: Use one tablet one to two times daily as directed.    Dispense:  60 tablet    Refill:  5    Diagnositics: Spirometry: FVC is 2.43 L (95% predicted) and FEV1 is 1.89 L (92% predicted). Environmental skin testing: Positive to molds and dust mite antigen. Food allergen skin testing: Negative despite a positive histamine control.    Physical examination: Blood pressure 100/60, pulse 96, temperature 98.2 F (36.8 C), temperature source Oral, resp. rate 20, height 5' 3.58" (1.615 m), weight 131 lb 2.8 oz (59.5 kg).  General: Alert, interactive, in no acute distress. HEENT: TMs pearly gray, turbinates moderately edematous without discharge, post-pharynx moderately erythematous.  Uvula and tonsils are surgically absent. Neck: Supple without lymphadenopathy. Lungs: Clear to auscultation without wheezing, rhonchi or rales. CV: Normal S1, S2 without murmurs. Abdomen: Nondistended, nontender. Skin: Warm and dry, without lesions or rashes. Extremities:  No clubbing, cyanosis or edema. Neuro:   Grossly  intact.  Review of systems:  Review of Systems  Constitutional: Negative for fever, chills and weight loss.  HENT: Positive for congestion. Negative for nosebleeds.   Eyes: Negative for blurred vision.  Respiratory: Negative for cough, hemoptysis, sputum production and wheezing.   Cardiovascular: Negative for chest pain.  Gastrointestinal: Negative for diarrhea and constipation.  Genitourinary: Negative for dysuria.  Musculoskeletal: Negative for myalgias and joint pain.  Skin: Positive for itching and rash.  Neurological: Negative for dizziness.  Endo/Heme/Allergies: Positive for environmental allergies. Does not bruise/bleed easily.    Past medical history:  Past Medical History  Diagnosis Date  . Hypertension   . Anxiety   . Hyperlipidemia   . GERD (gastroesophageal reflux disease)   . Irritable bowel syndrome   . Sinus problem   . Hidradenitis     groin  . Anemia   . Schatzki's ring   . Diverticulosis of colon with hemorrhage April 2013  . Stomach ulcer 1972    non-bleeding  . Obesity   . Left-sided weakness     since stroke, left eye trouble seeing  . Cancer ([place]) 1985    ovarian, no treatment except surgery  . Anginal pain ([place])   . Pneumonia ~ 2011  . Chronic bronchitis ([place])     "I get it q yr" (09/14/2012)  . Type II diabetes mellitus ([place])   . H/O hiatal hernia   .  Chronic headaches     "daily lately; sometimes monthly" (09/14/2012)  . Migraines   . Arthritis     "neck, left hand" (09/14/2012)  . Chest pain 09-11-2013    LEFT SIDED  . Heart attack [facility])     2003 mild MI, March 2013 mild MI  . History of blood transfusion 1985 AND 2013  . Vitamin B 12 deficiency 07-15-2013  . Neuropathy ([place])   . Critical lower limb ischemia     status post shock one directional atherectomy, PT and stenting using 5 uncovered stent of occluded left SFA on 11/03/14  . S/P arterial stent-mid Lt SFA 11/03/14 11/04/2014    left leg; iliac.  Marland Kitchen Complication of anesthesia      OCCASIONAL TROUBLE TURNING NECK TO RIGHT  . Stroke ([place]) 07-2007, 07-2008, 07-2009    total 3 strokes; mild left sided weakness and left eye "jumps".  . Asthma   . Recurrent upper respiratory infection (URI)   . 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 Angioedema 07/10/2015    Past surgical history:  Past Surgical History  Procedure Laterality Date  . Hiatal hernia repair    . Breast reduction surgery    . Axillary hidradenitis excision  1990-2008    bilateral  . Hydradenitis excision  01/2011; 03/2012    'groin and abdomen; 03/2012" (09/14/2012)  . Anterior cervical decomp/discectomy fusion  2002  . Colonoscopy  08/12/2011    Procedure: COLONOSCOPY;  Surgeon: Ladene Artist, MD,FACG;  Location[facility] ENDOSCOPY;  Service: Endoscopy;  Laterality: N/A;  . Esophagogastroduodenoscopy  08/12/2011    Procedure: ESOPHAGOGASTRODUODENOSCOPY (EGD);  Surgeon: Ladene Artist, MD,FACG;  Location: [facility] ENDOSCOPY;  Service: Endoscopy;  Laterality: N/A;  . Givens capsule study  08/13/2011    Procedure: GIVENS CAPSULE STUDY;  Surgeon: Ladene Artist, MD,FACG;  Location: [facility];  Service: Endoscopy;  Laterality: N/A;  . Nissen fundoplication  9390  . Uvulopalatopharyngoplasty, tonsillectomy and septoplasty  2000's  . Hammer toe surgery Bilateral ~ 2000  . Colonoscopy  06/19/2006  . Esophagogastroduodenoscopy  06/04/2005  . Cardiac catheterization  2004    mild disease  . Back surgery    . Abdominal hysterectomy  1985  . Cholecystectomy  1990's  . Hydradenitis excision  04/01/2012    Procedure: EXCISION HYDRADENITIS GROIN;  Surgeon: Pedro Earls, MD;  Location: [facility] ORS;  Service: General;  Laterality: Bilateral;  Excision of Hydradenitis of Perineum  . Appendectomy  1985  . Breast cyst excision Right 2008  . Reduction mammaplasty  1996?  Marland Kitchen Breast biopsy Right 2007  . Posterior lumbar fusion  2008 X 2  . Tonsillectomy and adenoidectomy  1959 AND 2000  . Hydradenitis excision N/A 09/17/2013    Procedure:  EXCISION PERINEAL HIDRADENITIS ;  Surgeon: Pedro Earls, MD;  Location: [facility] ORS;  Service: General;  Laterality: N/A;  also in the pubis area  . Mass excision Right 09/17/2013    Procedure: EXCISION MASS;  Surgeon: Pedro Earls, MD;  Location: [facility] ORS;  Service: General;  Laterality: Right;  . Anterior cervical decomp/discectomy fusion N/A 04/08/2014    Procedure: Cervical Six-Seven ANTERIOR CERVICAL DECOMPRESSION/DISCECTOMY FUSION Plating and Bonegraft  2 LEVELS;  Surgeon: Ashok Pall, MD;  Location: [facility] NEURO ORS;  Service: Neurosurgery;  Laterality: N/A;  Cervical Six-Seven ANTERIOR CERVICAL DECOMPRESSION/DISCECTOMY FUSION Plating and Bonegraft  2 LEVELS  . Peripheral vascular catheterization N/A 11/03/2014    Procedure: Lower Extremity Angiography;  Surgeon: Lorretta Harp, MD;  Location: [facility] CV  LAB;  Service: Cardiovascular;  Laterality: N/A;  . Cyst thigh Right   . Cataract extraction w/phaco Left 05/16/2015    Procedure: CATARACT EXTRACTION PHACO AND INTRAOCULAR LENS PLACEMENT (IOC);  Surgeon: Rutherford Guys, MD;  Location: [facility] ORS;  Service: Ophthalmology;  Laterality: Left;  CDE: 4.24  . Cataract extraction w/phaco Right 05/30/2015    Procedure: CATARACT EXTRACTION RIGHT EYE PHACO AND INTRAOCULAR LENS PLACEMENT ;  Surgeon: Rutherford Guys, MD;  Location: [facility] ORS;  Service: Ophthalmology;  Laterality: Right;  CDE:4.08  . Adenoidectomy      Family history: Family History  Problem Relation Age of Onset  . Breast cancer Mother   . Heart disease Mother   . Hypertension Mother   . Diabetes Father   . Heart disease Father   . Stroke Father   . Heart disease Sister   . Hypertension Sister   . Hypertension Sister   . Hyperlipidemia Sister   . Diabetes Sister   . Allergic rhinitis Sister   . Emphysema      great uncle  . Aneurysm Sister     brain  . Angioedema Neg Hx   . Asthma Neg Hx   . Eczema Neg Hx   . Immunodeficiency Neg Hx   . Urticaria Neg Hx     Social  history: Social History   Social History  . Marital Status: Divorced    Spouse Name: N/A  . Number of Children: 1  . Years of Education: N/A   Occupational History  .     Social History Main Topics  . Smoking status: Current Every Day Smoker -- 0.33 packs/day for 41 years    Types: Cigarettes  . Smokeless tobacco: Never Used     Comment: Getting ready to start nicotine patches RX by Dr. Gwenlyn Found per pt.  . Alcohol Use: No  . Drug Use: No     Comment: 05/09/2015.  "quit 07/27/1994"  . Sexual Activity: Not Currently    Birth Control/ Protection: Abstinence   Other Topics Concern  . Not on file   Social History Narrative   Environmental History: The patient lives in a 61 year old apartment with carpeting throughout and central air/heat.  There are no pets in the apartment though she has dogs outdoors.  She has smoked half pack of cigarettes per day on average over the past 45 years and is currently attempting to quit.    Medication List       This list is accurate as of: 07/10/15  5:14 PM.  Always use your most recent med list.               acetaminophen 325 MG tablet  Commonly known as:  TYLENOL  Take 2 tablets (650 mg total) by mouth every 4 (four) hours as needed for headache or mild pain.     albuterol 108 (90 Base) MCG/ACT inhaler  Commonly known as:  PROAIR HFA  1-2 inhalations every 4-6 hours as needed for cough or wheeze.     ALPRAZolam 0.5 MG tablet  Commonly known as:  XANAX  Take 0.25-0.5 mg by mouth 2 (two) times daily as needed for anxiety.     aspirin 325 MG EC tablet  Take 1 tablet (325 mg total) by mouth daily.     Azelastine HCl 0.15 % Soln  Place 2 sprays into both nostrils 2 (two) times daily.     b complex vitamins tablet  Take 1 tablet by mouth daily.     CALCIUM  600 + D PO  Take 1 tablet by mouth daily.     celecoxib 200 MG capsule  Commonly known as:  CELEBREX  Take 200 mg by mouth daily as needed for mild pain. Reported on 07/10/2015      CELEXA 10 MG tablet  Generic drug:  citalopram  Take 10 mg by mouth daily.     clopidogrel 75 MG tablet  Commonly known as:  PLAVIX  Take 75 mg by mouth at bedtime.     ibuprofen 800 MG tablet  Commonly known as:  ADVIL,MOTRIN  Take 800 mg by mouth every 8 (eight) hours as needed.     insulin aspart 100 UNIT/ML injection  Commonly known as:  novoLOG  Inject 5 Units into the skin 3 (three) times daily before meals.     LEVEMIR 100 UNIT/ML injection  Generic drug:  insulin detemir  Inject 18 Units into the skin at bedtime.     loratadine 10 MG tablet  Commonly known as:  CLARITIN  Take 10 mg by mouth daily as needed for allergies.     mometasone 50 MCG/ACT nasal spray  Commonly known as:  NASONEX  1-2 sprays in each nostril once daily for stuffy nose or drainage.     mupirocin cream 2 %  Commonly known as:  BACTROBAN  Apply 1 application topically 2 (two) times daily as needed (wound care (boils)).     nitroGLYCERIN 0.4 mg/hr patch  Commonly known as:  NITRODUR - Dosed in mg/24 hr  Place 0.4 mg onto the skin as needed (chest pain). Reported on 07/10/2015     nitroGLYCERIN 0.4 MG SL tablet  Commonly known as:  NITROSTAT  Place 1 tablet (0.4 mg total) under the tongue every 5 (five) minutes as needed for chest pain. Maximum 3 doses     OFLOXACIN PO  Take by mouth.     ONE TOUCH ULTRA TEST test strip  Generic drug:  glucose blood     pantoprazole 40 MG tablet  Commonly known as:  PROTONIX  Take 1 tablet (40 mg total) by mouth daily.     potassium chloride SA 20 MEQ tablet  Commonly known as:  K-DUR,KLOR-CON  Take 20 mEq by mouth daily as needed (for cramps).     PROLENSA 0.07 % Soln  Generic drug:  Bromfenac Sodium  Apply to eye.     ranitidine 150 MG tablet  Commonly known as:  ZANTAC  Use one tablet one to two times daily as directed.     rosuvastatin 40 MG tablet  Commonly known as:  CRESTOR  Take 40 mg by mouth daily.     rosuvastatin 20 MG tablet   Commonly known as:  CRESTOR  Take 20 mg by mouth at bedtime. Reported on 07/10/2015     SUMAtriptan 50 MG tablet  Commonly known as:  IMITREX  Take 50 mg by mouth every 2 (two) hours as needed for migraine.     TRIBENZOR 40-5-12.5 MG Tabs  Generic drug:  Olmesartan-Amlodipine-HCTZ  Take 1 tablet by mouth daily.     TRULICITY 5.09 TO/6.7TI Sopn  Generic drug:  Dulaglutide  Inject 1 Units into the skin once a week. Reported on 07/10/2015     zolpidem 10 MG tablet  Commonly known as:  AMBIEN  Take 5 mg by mouth at bedtime as needed for sleep.        Known medication allergies: Allergies  Allergen Reactions  . Bactrim [Sulfamethoxazole-Trimethoprim] Shortness Of Breath  .  Sulfa Antibiotics Shortness Of Breath and Palpitations  . Fish Allergy Hives and Swelling    Tongue swelling  . Iodine Swelling  . Metformin And Related Diarrhea  . Adhesive [Tape] Rash    Paper tape is ok  . Shellfish Allergy Swelling and Rash    Tongue swelling    I appreciate the opportunity to take part in this Kasiah's care. Please do not hesitate to contact me with questions.  Sincerely,   R. Edgar Frisk, MD

## 2015-07-10 NOTE — Assessment & Plan Note (Addendum)
Lower respiratory symptoms are rare and spirometry is normal today.  Tobacco cessation has been discussed and encouraged.  Continue albuterol HFA, 1-2 inhalations every 4-6 hours as needed.  Subjective and objective measures of pulmonary function will be followed and the treatment plan will be adjusted accordingly.

## 2015-07-10 NOTE — Assessment & Plan Note (Signed)
Associated angioedema occurs in up to 50% of patients with chronic or recurrent urticaria.  Treatment/diagnostic plan as outlined above.

## 2015-07-13 ENCOUNTER — Encounter: Payer: Self-pay | Admitting: *Deleted

## 2015-07-17 DIAGNOSIS — T783XXA Angioneurotic edema, initial encounter: Secondary | ICD-10-CM | POA: Diagnosis not present

## 2015-07-17 DIAGNOSIS — K2970 Gastritis, unspecified, without bleeding: Secondary | ICD-10-CM | POA: Diagnosis not present

## 2015-07-17 DIAGNOSIS — R5383 Other fatigue: Secondary | ICD-10-CM | POA: Diagnosis not present

## 2015-07-17 DIAGNOSIS — L500 Allergic urticaria: Secondary | ICD-10-CM | POA: Diagnosis not present

## 2015-07-17 LAB — SEDIMENTATION RATE: Sed Rate: 5 mm/hr (ref 0–30)

## 2015-07-18 LAB — CP658 FISH PANEL
Allergen, Flounder, Rf337: <0.1 kU/L
Allergen, Salmon, f41: <0.1 kU/L
Allergen, Trout, f204: <0.1 kU/L
Allergen,Halibut,Rf303: <0.1 kU/L
Allergen,Mackerel,Rf206: <0.1 kU/L
Fish Cod: <0.1 kU/L
Tuna IgE: <0.1 kU/L

## 2015-07-18 LAB — ANTI-NUCLEAR AB-TITER (ANA TITER): ANA Titer 1: 1:160 {titer} — ABNORMAL HIGH

## 2015-07-18 LAB — ALLERGY-SHELLFISH PANEL
Clams: <0.1 kU/L
Crab: <0.1 kU/L
Lobster: <0.1 kU/L
Shrimp IgE: <0.1 kU/L

## 2015-07-18 LAB — ANA: Anti Nuclear Antibody(ANA): POSITIVE — AB

## 2015-07-18 LAB — TRYPTASE: Tryptase: 4.1 ug/L (ref <11)

## 2015-07-18 LAB — H. PYLORI BREATH TEST: H. pylori Breath Test: NOT DETECTED

## 2015-07-20 ENCOUNTER — Ambulatory Visit (INDEPENDENT_AMBULATORY_CARE_PROVIDER_SITE_OTHER): Payer: Medicare Other | Admitting: Internal Medicine

## 2015-07-20 ENCOUNTER — Other Ambulatory Visit (INDEPENDENT_AMBULATORY_CARE_PROVIDER_SITE_OTHER): Payer: Medicare Other

## 2015-07-20 ENCOUNTER — Encounter: Payer: Self-pay | Admitting: Internal Medicine

## 2015-07-20 VITALS — BP 138/60 | HR 72 | Ht 63.0 in | Wt 134.0 lb

## 2015-07-20 DIAGNOSIS — Z8719 Personal history of other diseases of the digestive system: Secondary | ICD-10-CM

## 2015-07-20 DIAGNOSIS — R1013 Epigastric pain: Secondary | ICD-10-CM

## 2015-07-20 DIAGNOSIS — Z800 Family history of malignant neoplasm of digestive organs: Secondary | ICD-10-CM

## 2015-07-20 DIAGNOSIS — R10816 Epigastric abdominal tenderness: Secondary | ICD-10-CM

## 2015-07-20 LAB — CBC WITH DIFFERENTIAL/PLATELET
Basophils Absolute: 0 10*3/uL (ref 0.0–0.1)
Basophils Relative: 0.3 % (ref 0.0–3.0)
Eosinophils Absolute: 0.1 10*3/uL (ref 0.0–0.7)
Eosinophils Relative: 1.3 % (ref 0.0–5.0)
HCT: 38.2 % (ref 36.0–46.0)
Hemoglobin: 12.6 g/dL (ref 12.0–15.0)
Lymphocytes Relative: 26.3 % (ref 12.0–46.0)
Lymphs Abs: 1.8 10*3/uL (ref 0.7–4.0)
MCHC: 33 g/dL (ref 30.0–36.0)
MCV: 84.3 fl (ref 78.0–100.0)
Monocytes Absolute: 0.5 10*3/uL (ref 0.1–1.0)
Monocytes Relative: 7.2 % (ref 3.0–12.0)
Neutro Abs: 4.4 10*3/uL (ref 1.4–7.7)
Neutrophils Relative %: 64.9 % (ref 43.0–77.0)
Platelets: 239 10*3/uL (ref 150.0–400.0)
RBC: 4.54 Mil/uL (ref 3.87–5.11)
RDW: 13.7 % (ref 11.5–15.5)
WBC: 6.7 10*3/uL (ref 4.0–10.5)

## 2015-07-20 LAB — COMPREHENSIVE METABOLIC PANEL
ALT: 29 U/L (ref 0–35)
AST: 27 U/L (ref 0–37)
Albumin: 4.2 g/dL (ref 3.5–5.2)
Alkaline Phosphatase: 56 U/L (ref 39–117)
BUN: 9 mg/dL (ref 6–23)
CO2: 32 mEq/L (ref 19–32)
Calcium: 9.9 mg/dL (ref 8.4–10.5)
Chloride: 104 mEq/L (ref 96–112)
Creatinine, Ser: 0.74 mg/dL (ref 0.40–1.20)
GFR: 102.78 mL/min (ref >60.00)
Glucose, Bld: 182 mg/dL — ABNORMAL HIGH (ref 70–99)
Potassium: 4 mEq/L (ref 3.5–5.1)
Sodium: 141 mEq/L (ref 135–145)
Total Bilirubin: 0.7 mg/dL (ref 0.2–1.2)
Total Protein: 7 g/dL (ref 6.0–8.3)

## 2015-07-20 LAB — AMYLASE: Amylase: 50 U/L (ref 27–131)

## 2015-07-20 LAB — ALPHA-GAL PANEL
Allergen, Mutton, f88: <0.1 kU/L
Allergen, Pork, f26: <0.1 kU/L
Beef: <0.1 kU/L
Galactose-alpha-1,3-galactose IgE: <0.1 kU/L (ref <0.35)

## 2015-07-20 LAB — LIPASE: Lipase: 44 U/L (ref 11.0–59.0)

## 2015-07-20 NOTE — Patient Instructions (Addendum)
You have been given a separate informational sheet regarding your tobacco use, the importance of quitting and local resources to help you quit.  Your physician has requested that you go to the basement for lab work before leaving today.   You have been scheduled for a CT scan of the abdomen and pelvis at [place].   You are scheduled on 07/26/15 At 9:30 am . You should arrive 15 minutes prior to your appointment time for registration. Please follow the written instructions below on the day of your exam:  WARNING: IF YOU ARE ALLERGIC TO IODINE/X-RAY DYE, PLEASE NOTIFY RADIOLOGY IMMEDIATELY AT [phone]! YOU WILL BE GIVEN A 13 HOUR PREMEDICATION PREP.  1) Do not eat or drink anything after 5:30 am (4 hours prior to your test) 2) You have been given 2 bottles of oral contrast to drink. The solution may taste better if refrigerated, but do NOT add ice or any other liquid to this solution. Shake well before drinking.    Drink 1 bottle of contrast @ 7:30 am (2 hours prior to your exam)  Drink 1 bottle of contrast @ 8:30 am (1 hour prior to your exam)  You may take any medications as prescribed with a small amount of water except for the following: Metformin, Glucophage, Glucovance, Avandamet, Riomet, Fortamet, Actoplus Met, Janumet, Glumetza or Metaglip. The above medications must be held the day of the exam AND 48 hours after the exam.  The purpose of you drinking the oral contrast is to aid in the visualization of your intestinal tract. The contrast solution may cause some diarrhea. Before your exam is started, you will be given a small amount of fluid to drink. Depending on your individual set of symptoms, you may also receive an intravenous injection of x-ray contrast/dye. Plan on being at [facility] for 30 minutes or longer, depending on the type of exam you are having performed.  This test typically takes 30-45 minutes to complete.  If you have any questions regarding your exam or  if you need to reschedule, you may call the CT department at [phone] between the hours of 8:00 am and 5:00 pm, Monday-Friday.  ________________________________________________________________________     Your physician has requested that you go to the basement for lab work before leaving today.    I appreciate the opportunity to care for you. Carl Gessner, MD  

## 2015-07-20 NOTE — Progress Notes (Signed)
Quick Note:  Labs ok except some hyperglycemia - My Chart note ______

## 2015-07-20 NOTE — Progress Notes (Signed)
   Subjective:    Patient ID: [name], female    DOB: 08/07/1955, 60 y.o.   MRN: [id number]  Chief complaint abdominal pain   HPI The patient is a pleasant middle-aged African-American woman who has a history of reflux and irritable bowel-type problems. She also history history of suspected chronic pancreatitis. Couple months ago she began TR ULICITY the for her diabetes and developed increasing abdominal pain with epigastric burning and pain radiating to the back after she eats. It's become less frequent since stopping it but she has still had some epigastric burning. Her weight went down on the medication and after stopping it it is risen again. She has nausea. Early on pantoprazole and ranitidine. Takes insulin for her diabetes is well and is on clopidogrel. Celecoxib is used also. She is on a 325 mg aspirin.  He is concerned she could have pancreatic cancer as she's had a family member or more than 1 with that. Wt Readings from Last 3 Encounters:  07/20/15 134 lb (60.782 kg)  07/10/15 131 lb 2.8 oz (59.5 kg)  07/05/15 128 lb (58.06 kg)   Medications, allergies, past medical history, past surgical history, family history and social history are reviewed and updated in the EMR.  Review of Systems As above. Denies significant stress or anxiety.    Objective:   Physical Exam @BP  138/60 mmHg  Pulse 72  Ht 5\' 3"  (1.6 m)  Wt 134 lb (60.782 kg)  BMI 23.74 kg/m2@  General:  NAD Eyes:   anicteric Lungs:  clear Heart::  S1S2 no rubs, murmurs or gallops Abdomen:  soft and tender epigastrium - mild - mod, BS+ Ext:   no edema, cyanosis or clubbing    Data Reviewed:  CT abdomen and pelvis 2014 raise suspicion of pancreatitis versus duodenal inflammation and EGD was negative except for some food retention. Think she was treated with some pancreatic enzyme supplements as well as PPI then.       Assessment & Plan:  Abdominal pain, epigastric  Epigastric abdominal  tenderness  History of pancreatitis  Family history of pancreatic cancer   CT abd/pelvis w/ contrastTo evaluate CBC, CMET, lipase, amylase will also be checked  ? IBS, meds, pancreatitis, otherAs causes.  Further plans pending the results though I would not go back on the TR ULI [facility] Y  CC: Wende Neighbors, MD

## 2015-07-21 ENCOUNTER — Encounter: Payer: Self-pay | Admitting: Internal Medicine

## 2015-07-24 LAB — CP CHRONIC URTICARIA INDEX PANEL
Histamine Release: <16 % (ref <16)
TSH: 1.95 mIU/L
Thyroglobulin Ab: <1 IU/mL (ref <2)
Thyroperoxidase Ab SerPl-aCnc: <1 IU/mL (ref <9)

## 2015-07-25 ENCOUNTER — Telehealth: Payer: Self-pay | Admitting: Allergy and Immunology

## 2015-07-25 NOTE — Telephone Encounter (Signed)
I called to confirm that we will set up a shrimp oral challenge.  If negative, we will follow with a fish oral challenge on a separate day.  She verbalized understanding and will set up the appointment.

## 2015-07-26 ENCOUNTER — Ambulatory Visit (HOSPITAL_COMMUNITY)
Admission: RE | Admit: 2015-07-26 | Discharge: 2015-07-26 | Disposition: A | Discharge status: Home / Self Care | Payer: Medicare Other | Source: Ambulatory Visit | Attending: Internal Medicine | Admitting: Internal Medicine

## 2015-07-26 DIAGNOSIS — Z800 Family history of malignant neoplasm of digestive organs: Secondary | ICD-10-CM

## 2015-07-26 DIAGNOSIS — Z90710 Acquired absence of both cervix and uterus: Secondary | ICD-10-CM | POA: Insufficient documentation

## 2015-07-26 DIAGNOSIS — R109 Unspecified abdominal pain: Secondary | ICD-10-CM | POA: Diagnosis not present

## 2015-07-26 DIAGNOSIS — R1013 Epigastric pain: Secondary | ICD-10-CM

## 2015-07-26 DIAGNOSIS — Z9049 Acquired absence of other specified parts of digestive tract: Secondary | ICD-10-CM | POA: Insufficient documentation

## 2015-07-26 DIAGNOSIS — I700 Atherosclerosis of aorta: Secondary | ICD-10-CM | POA: Insufficient documentation

## 2015-07-26 DIAGNOSIS — Z8719 Personal history of other diseases of the digestive system: Secondary | ICD-10-CM

## 2015-07-26 DIAGNOSIS — R10816 Epigastric abdominal tenderness: Secondary | ICD-10-CM

## 2015-07-26 MED ORDER — IOHEXOL 300 MG/ML  SOLN
100.0000 mL | Freq: Once | INTRAMUSCULAR | Status: AC | PRN
  Administered 2015-07-26: 100 mL via INTRAVENOUS

## 2015-07-27 ENCOUNTER — Other Ambulatory Visit: Payer: Self-pay

## 2015-07-27 MED ORDER — DICYCLOMINE HCL 20 MG PO TABS: 20.0000 mg | ORAL_TABLET | Freq: Three times a day (TID) | ORAL | Status: DC

## 2015-07-27 NOTE — Progress Notes (Signed)
Quick Note:  Let her know CT shows clacifications and plaques in arteries to intestine though do not think that is source of her problem it is a possibility (if they get narrowed). I thought her meds and IBS were likely cause. Pancreas is ok!  I would like to know if she is still having problems - if she is Rx dicyclomine 20 mg qac # 90 1 RF  She can call back if that does not help  ______

## 2015-07-28 ENCOUNTER — Telehealth: Payer: Self-pay

## 2015-07-28 NOTE — Telephone Encounter (Signed)
Patient is wondering can she do Fish challenge instead of shrimp challenge on the 29th with Dr. Verlin Fester

## 2015-07-28 NOTE — Telephone Encounter (Signed)
PLEASE ADIVSE

## 2015-07-31 NOTE — Telephone Encounter (Signed)
Yes, that's fine. Please give her instructions for the fish challenge. Cod is a good choice.

## 2015-08-01 NOTE — Telephone Encounter (Signed)
Yes, that's fine 

## 2015-08-01 NOTE — Telephone Encounter (Signed)
Patient will bring perch with her that has been broiled without seasonings from home for the allergy test

## 2015-08-01 NOTE — Telephone Encounter (Signed)
Patient would like to know if she can use perch that she already has on hand at home.

## 2015-08-02 ENCOUNTER — Ambulatory Visit (INDEPENDENT_AMBULATORY_CARE_PROVIDER_SITE_OTHER): Payer: Medicare Other | Admitting: Allergy and Immunology

## 2015-08-02 ENCOUNTER — Encounter: Payer: Self-pay | Admitting: Allergy and Immunology

## 2015-08-02 VITALS — BP 125/80 | HR 85 | Temp 98.0°F | Resp 17

## 2015-08-02 DIAGNOSIS — T7800XA Anaphylactic reaction due to unspecified food, initial encounter: Secondary | ICD-10-CM | POA: Diagnosis not present

## 2015-08-02 DIAGNOSIS — Z8709 Personal history of other diseases of the respiratory system: Secondary | ICD-10-CM

## 2015-08-02 DIAGNOSIS — J3089 Other allergic rhinitis: Secondary | ICD-10-CM | POA: Diagnosis not present

## 2015-08-02 MED ORDER — EPINEPHRINE 0.3 MG/0.3ML IJ SOAJ: 0.3000 mg | Freq: Once | INTRAMUSCULAR | Status: AC

## 2015-08-02 NOTE — Assessment & Plan Note (Signed)
   Continue albuterol HFA every 4-6 hours as needed.  Tobacco cessation is encouraged.  Subjective and objective measures of pulmonary function will be followed and the treatment plan will be adjusted accordingly.

## 2015-08-02 NOTE — Assessment & Plan Note (Signed)
   Continue appropriate allergen avoidance measures, azelastine nasal spray as needed, and nasal saline irrigation as needed.

## 2015-08-02 NOTE — Progress Notes (Addendum)
Follow-up Note  RE: [name] MRN: [id number] DOB: 09-22-1955 Date of Office Visit: 08/02/2015  Primary care provider: Wende Neighbors, MD Referring provider: [name], MD  History of present illness: HPI Comments: [name] is a 60 y.o. female with history of asthma, allergic rhinitis, and possible food allergy who presents today for fish open graded oral challenge.  She was last seen in this clinic on 07/10/2015.  She has no upper or lower respiratory symptom complaints today.   Assessment and plan: Food allergy The patient's history suggests the possibility of fish allergy and positive oral challenge today confirm this diagnosis.  Meticulous avoidance of fish as discussed.   Addition, she will continue avoidance of shellfish until this allergy has been definitively ruled out.  A prescription has been provided for epinephrine auto-injector 2 pack along with instructions for proper administration.  A food allergy action plan has been provided and discussed.  Medic Alert identification is recommended.  Perennial allergic rhinitis with a possible non-allergic component  Continue appropriate allergen avoidance measures, azelastine nasal spray as needed, and nasal saline irrigation as needed.  History of asthma  Continue albuterol HFA every 4-6 hours as needed.  Tobacco cessation is encouraged.  Subjective and objective measures of pulmonary function will be followed and the treatment plan will be adjusted accordingly.    Meds ordered this encounter  Medications  . EPINEPHrine 0.3 mg/0.3 mL IJ SOAJ injection    Sig: Inject 0.3 mLs (0.3 mg total) into the muscle once.    Dispense:  1 Device    Refill:  1    Please dispense (1) two pack mylan generic brand    Diagnositics: Fish open graded oral challenge: The patient experienced cutaneous and ocular symptoms after a cumulative dose of 3.5 g of fish. Spirometry: Normal with an FEV1 of 88% predicted.     Physical examination: Blood pressure 125/80, pulse 85, temperature 98 F (36.7 C), temperature source Oral, resp. rate 17.  General: Alert, interactive, in no acute distress. HEENT: TMs pearly gray, turbinates moderately edematous without discharge, post-pharynx mildly erythematous. Neck: Supple without lymphadenopathy. Lungs: Clear to auscultation without wheezing, rhonchi or rales. CV: Normal S1, S2 without murmurs. Skin: Warm and dry, without lesions or rashes.  The following portions of the patient's history were reviewed and updated as appropriate: allergies, current medications, past family history, past medical history, past social history, past surgical history and problem list.    Medication List       This list is accurate as of: 08/02/15  6:23 PM.  Always use your most recent med list.               acetaminophen 325 MG tablet  Commonly known as:  TYLENOL  Take 2 tablets (650 mg total) by mouth every 4 (four) hours as needed for headache or mild pain.     albuterol 108 (90 Base) MCG/ACT inhaler  Commonly known as:  PROAIR HFA  1-2 inhalations every 4-6 hours as needed for cough or wheeze.     ALPRAZolam 0.5 MG tablet  Commonly known as:  XANAX  Take 0.25-0.5 mg by mouth 2 (two) times daily as needed for anxiety.     aspirin 325 MG EC tablet  Take 1 tablet (325 mg total) by mouth daily.     Azelastine HCl 0.15 % Soln  Place 2 sprays into both nostrils 2 (two) times daily.     b complex vitamins tablet  Take 1  tablet by mouth daily.     CALCIUM 600 + D PO  Take 1 tablet by mouth daily.     celecoxib 200 MG capsule  Commonly known as:  CELEBREX  Take 200 mg by mouth daily as needed for mild pain. Reported on 07/10/2015     CELEXA 10 MG tablet  Generic drug:  citalopram  Take 10 mg by mouth daily.     clopidogrel 75 MG tablet  Commonly known as:  PLAVIX  Take 75 mg by mouth at bedtime.     dicyclomine 20 MG tablet  Commonly known as:  BENTYL  Take  1 tablet (20 mg total) by mouth 3 (three) times daily before meals.     EPINEPHrine 0.3 mg/0.3 mL Soaj injection  Commonly known as:  EPI-PEN  Inject 0.3 mLs (0.3 mg total) into the muscle once.     ibuprofen 800 MG tablet  Commonly known as:  ADVIL,MOTRIN  Take 800 mg by mouth every 8 (eight) hours as needed.     insulin aspart 100 UNIT/ML injection  Commonly known as:  novoLOG  Inject 5 Units into the skin 3 (three) times daily before meals.     LEVEMIR 100 UNIT/ML injection  Generic drug:  insulin detemir  Inject 18 Units into the skin at bedtime.     loratadine 10 MG tablet  Commonly known as:  CLARITIN  Take 10 mg by mouth daily as needed for allergies.     mometasone 50 MCG/ACT nasal spray  Commonly known as:  NASONEX  1-2 sprays in each nostril once daily for stuffy nose or drainage.     mupirocin cream 2 %  Commonly known as:  BACTROBAN  Apply 1 application topically 2 (two) times daily as needed (wound care (boils)).     nitroGLYCERIN 0.4 mg/hr patch  Commonly known as:  NITRODUR - Dosed in mg/24 hr  Place 0.4 mg onto the skin as needed (chest pain). Reported on 07/10/2015     nitroGLYCERIN 0.4 MG SL tablet  Commonly known as:  NITROSTAT  Place 1 tablet (0.4 mg total) under the tongue every 5 (five) minutes as needed for chest pain. Maximum 3 doses     OFLOXACIN PO  Take by mouth.     ONE TOUCH ULTRA TEST test strip  Generic drug:  glucose blood     pantoprazole 40 MG tablet  Commonly known as:  PROTONIX  Take 1 tablet (40 mg total) by mouth daily.     potassium chloride SA 20 MEQ tablet  Commonly known as:  K-DUR,KLOR-CON  Take 20 mEq by mouth daily as needed (for cramps).     PROLENSA 0.07 % Soln  Generic drug:  Bromfenac Sodium  Apply to eye.     ranitidine 150 MG tablet  Commonly known as:  ZANTAC  Use one tablet one to two times daily as directed.     rosuvastatin 40 MG tablet  Commonly known as:  CRESTOR  Take 40 mg by mouth daily.      SUMAtriptan 50 MG tablet  Commonly known as:  IMITREX  Take 50 mg by mouth every 2 (two) hours as needed for migraine.     TRESIBA FLEXTOUCH 200 UNIT/ML Sopn  Generic drug:  Insulin Degludec     TRIBENZOR 40-5-12.5 MG Tabs  Generic drug:  Olmesartan-Amlodipine-HCTZ  Take 1 tablet by mouth daily.     TRULICITY [id number] 0000000 Sopn  Generic drug:  Dulaglutide     zolpidem 10  MG tablet  Commonly known as:  AMBIEN  Take 5 mg by mouth at bedtime as needed for sleep.        Allergies  Allergen Reactions  . Bactrim [Sulfamethoxazole-Trimethoprim] Shortness Of Breath  . Sulfa Antibiotics Shortness Of Breath and Palpitations  . 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 Fish Allergy Hives and Swelling    Tongue swelling  . Iodine Swelling  . Metformin And Related Diarrhea  . Adhesive [Tape] Rash    Paper tape is ok  . Shellfish Allergy Swelling and Rash    Tongue swelling    I appreciate the opportunity to take part in this [name]'s care. Please do not hesitate to contact me with questions.  Sincerely,   R. Edgar Frisk, MD

## 2015-08-02 NOTE — Assessment & Plan Note (Signed)
The patient's history suggests the possibility of fish allergy and positive oral challenge today confirm this diagnosis.  Meticulous avoidance of fish as discussed.   Addition, she will continue avoidance of shellfish until this allergy has been definitively ruled out.  A prescription has been provided for epinephrine auto-injector 2 pack along with instructions for proper administration.  A food allergy action plan has been provided and discussed.  Medic Alert identification is recommended.

## 2015-08-02 NOTE — Patient Instructions (Signed)
Food allergy The patient's history suggests the possibility of fish allergy and positive oral challenge today confirm this diagnosis.  Meticulous avoidance of fish as discussed.   Addition, she will continue avoidance of shellfish until this allergy has been definitively ruled out.  A prescription has been provided for epinephrine auto-injector 2 pack along with instructions for proper administration.  A food allergy action plan has been provided and discussed.  Medic Alert identification is recommended.  Perennial allergic rhinitis with a possible non-allergic component  Continue appropriate allergen avoidance measures, azelastine nasal spray as needed, and nasal saline irrigation as needed.  History of asthma  Continue albuterol HFA every 4-6 hours as needed.  Tobacco cessation is encouraged.  Subjective and objective measures of pulmonary function will be followed and the treatment plan will be adjusted accordingly.   Return in about 4 weeks (around 08/30/2015) for shrimp oral challenge.

## 2015-08-07 ENCOUNTER — Encounter: Payer: Self-pay | Admitting: *Deleted

## 2015-08-09 DIAGNOSIS — I119 Hypertensive heart disease without heart failure: Secondary | ICD-10-CM | POA: Diagnosis not present

## 2015-08-09 DIAGNOSIS — I252 Old myocardial infarction: Secondary | ICD-10-CM | POA: Diagnosis not present

## 2015-08-09 DIAGNOSIS — I25118 Atherosclerotic heart disease of native coronary artery with other forms of angina pectoris: Secondary | ICD-10-CM | POA: Diagnosis not present

## 2015-08-09 DIAGNOSIS — I255 Ischemic cardiomyopathy: Secondary | ICD-10-CM | POA: Diagnosis not present

## 2015-08-09 DIAGNOSIS — E785 Hyperlipidemia, unspecified: Secondary | ICD-10-CM | POA: Diagnosis not present

## 2015-09-13 DIAGNOSIS — L732 Hidradenitis suppurativa: Secondary | ICD-10-CM | POA: Diagnosis not present

## 2015-09-18 DIAGNOSIS — G43011 Migraine without aura, intractable, with status migrainosus: Secondary | ICD-10-CM | POA: Diagnosis not present

## 2015-10-04 ENCOUNTER — Other Ambulatory Visit: Payer: Self-pay | Admitting: Cardiology

## 2015-10-04 NOTE — Telephone Encounter (Signed)
Rx Refill

## 2015-10-19 ENCOUNTER — Encounter: Payer: Self-pay | Admitting: Cardiovascular Disease

## 2015-10-19 DIAGNOSIS — E782 Mixed hyperlipidemia: Secondary | ICD-10-CM | POA: Diagnosis not present

## 2015-10-19 DIAGNOSIS — D519 Vitamin B12 deficiency anemia, unspecified: Secondary | ICD-10-CM | POA: Diagnosis not present

## 2015-10-19 DIAGNOSIS — E1140 Type 2 diabetes mellitus with diabetic neuropathy, unspecified: Secondary | ICD-10-CM | POA: Diagnosis not present

## 2015-10-19 DIAGNOSIS — E559 Vitamin D deficiency, unspecified: Secondary | ICD-10-CM | POA: Diagnosis not present

## 2015-10-25 DIAGNOSIS — E782 Mixed hyperlipidemia: Secondary | ICD-10-CM | POA: Diagnosis not present

## 2015-10-25 DIAGNOSIS — G589 Mononeuropathy, unspecified: Secondary | ICD-10-CM | POA: Diagnosis not present

## 2015-10-25 DIAGNOSIS — I10 Essential (primary) hypertension: Secondary | ICD-10-CM | POA: Diagnosis not present

## 2015-10-25 DIAGNOSIS — E1140 Type 2 diabetes mellitus with diabetic neuropathy, unspecified: Secondary | ICD-10-CM | POA: Diagnosis not present

## 2015-12-05 DIAGNOSIS — I10 Essential (primary) hypertension: Secondary | ICD-10-CM | POA: Diagnosis not present

## 2015-12-05 DIAGNOSIS — H6120 Impacted cerumen, unspecified ear: Secondary | ICD-10-CM | POA: Diagnosis not present

## 2015-12-05 DIAGNOSIS — R42 Dizziness and giddiness: Secondary | ICD-10-CM | POA: Diagnosis not present

## 2015-12-11 ENCOUNTER — Other Ambulatory Visit: Payer: Self-pay | Admitting: Cardiovascular Disease

## 2015-12-11 DIAGNOSIS — I739 Peripheral vascular disease, unspecified: Secondary | ICD-10-CM

## 2015-12-12 DIAGNOSIS — I959 Hypotension, unspecified: Secondary | ICD-10-CM | POA: Diagnosis not present

## 2015-12-12 DIAGNOSIS — H8110 Benign paroxysmal vertigo, unspecified ear: Secondary | ICD-10-CM | POA: Diagnosis not present

## 2015-12-20 ENCOUNTER — Ambulatory Visit (HOSPITAL_COMMUNITY)
Admission: RE | Admit: 2015-12-20 | Discharge: 2015-12-20 | Disposition: A | Discharge status: Home / Self Care | Payer: Medicare Other | Source: Ambulatory Visit | Attending: Cardiovascular Disease | Admitting: Cardiovascular Disease

## 2015-12-20 DIAGNOSIS — R938 Abnormal findings on diagnostic imaging of other specified body structures: Secondary | ICD-10-CM | POA: Insufficient documentation

## 2015-12-20 DIAGNOSIS — E785 Hyperlipidemia, unspecified: Secondary | ICD-10-CM | POA: Insufficient documentation

## 2015-12-20 DIAGNOSIS — I771 Stricture of artery: Secondary | ICD-10-CM | POA: Diagnosis not present

## 2015-12-20 DIAGNOSIS — I739 Peripheral vascular disease, unspecified: Secondary | ICD-10-CM | POA: Diagnosis not present

## 2015-12-20 DIAGNOSIS — F419 Anxiety disorder, unspecified: Secondary | ICD-10-CM | POA: Diagnosis not present

## 2015-12-20 DIAGNOSIS — E1142 Type 2 diabetes mellitus with diabetic polyneuropathy: Secondary | ICD-10-CM | POA: Insufficient documentation

## 2015-12-20 DIAGNOSIS — I10 Essential (primary) hypertension: Secondary | ICD-10-CM | POA: Diagnosis not present

## 2015-12-20 DIAGNOSIS — I70202 Unspecified atherosclerosis of native arteries of extremities, left leg: Secondary | ICD-10-CM | POA: Insufficient documentation

## 2015-12-20 DIAGNOSIS — K219 Gastro-esophageal reflux disease without esophagitis: Secondary | ICD-10-CM | POA: Diagnosis not present

## 2015-12-27 ENCOUNTER — Encounter: Payer: Self-pay | Admitting: Cardiovascular Disease

## 2015-12-27 ENCOUNTER — Ambulatory Visit (INDEPENDENT_AMBULATORY_CARE_PROVIDER_SITE_OTHER): Payer: Medicare Other | Admitting: Cardiovascular Disease

## 2015-12-27 ENCOUNTER — Other Ambulatory Visit: Payer: Self-pay | Admitting: *Deleted

## 2015-12-27 VITALS — BP 114/74 | HR 98 | Ht 64.0 in | Wt 136.0 lb

## 2015-12-27 DIAGNOSIS — Z79899 Other long term (current) drug therapy: Secondary | ICD-10-CM | POA: Diagnosis not present

## 2015-12-27 DIAGNOSIS — I739 Peripheral vascular disease, unspecified: Secondary | ICD-10-CM | POA: Diagnosis not present

## 2015-12-27 DIAGNOSIS — Z01818 Encounter for other preprocedural examination: Secondary | ICD-10-CM

## 2015-12-27 DIAGNOSIS — D689 Coagulation defect, unspecified: Secondary | ICD-10-CM | POA: Diagnosis not present

## 2015-12-27 DIAGNOSIS — I10 Essential (primary) hypertension: Secondary | ICD-10-CM

## 2015-12-27 NOTE — Patient Instructions (Signed)
Medication Instructions:  Your physician recommends that you continue on your current medications as directed. Please refer to the Current Medication list given to you today.   Testing/Procedures: Dr. Gwenlyn Found has ordered a peripheral angiogram to be done at [facility].  This procedure is going to look at the bloodflow in your lower extremities.  If Dr. Gwenlyn Found is able to open up the arteries, you will have to spend one night in the hospital.  If he is not able to open the arteries, you will be able to go home that same day.    After the procedure, you will not be allowed to drive for 3 days or push, pull, or lift anything greater than 10 lbs for one week.    You will be required to have the following tests prior to the procedure:  1. Blood work-the blood work can be done no more than 14 days prior to the procedure.  It can be done at any [facility] lab.  There is one downstairs on the first floor of this building and one in the [facility] building [phone] [address], [address])  2. Chest Xray-the chest xray order has already been placed at the [facility].     *REPS  SCOTT  Puncture site RIGHT GROIN   If you need a refill on your cardiac medications before your next appointment, please call your pharmacy.

## 2015-12-27 NOTE — Assessment & Plan Note (Signed)
History of atypical chest pain. Patient is under a lot of stress. Myoview stress test performed in March of this year was nonischemic.

## 2015-12-27 NOTE — Assessment & Plan Note (Signed)
History of hyperlipidemia on statin therapy followed by her PCP. 

## 2015-12-27 NOTE — Assessment & Plan Note (Signed)
History of peripheral arterial disease with critical limb ischemia status post peripheral intervention by myself 11/03/14 with recanalization of a left SFA CTO using a Viabahn covered stent. Her ABI did normalize as did her symptoms. She has continued to smoke. Her ABI has fallen from 1 down to 0.7 and she has a new high-frequency signal in her mid to distal left SFA with recurrent symptoms. She is on dual antiplatelet  Therapy. I will arrange for her to undergo repeat angiography and intervention.

## 2015-12-27 NOTE — Progress Notes (Signed)
12/27/2015 [name]   January 02, 1956  [id number]  Primary Physician Jennifer Neighbors, MD Primary Cardiologist: Jennifer Harp MD [name]  HPI:  [name] is a very pleasant 60 year old thin-appearing divorced African-American female mother of one, grandmother of one grandchild who is currently disabled because of a prior stroke. She was referred by [name], from [facility] podiatry, for evaluation and treatment of critical limb ischemia. I last saw her in the office 06/21/15. Her cardiovascular risk factor profile is notable for a strong family history of heart disease with the father about a stent, mother who had bypass surgery and a sister who died at age 44 of a myocardial infarction. She has never had a heart attack but apparently has had a stroke in the past with some mild left-sided residua. She has a history of tobacco abuse in the last 43 years of one third pack per day trying to quit currently. She has treated diabetes, hypertension and hyperlipidemia. She had the onset of left ear pain approximately 3 months ago with progression to critical limb ischemia in early June with ischemic appearing left fourth toe. Dopplers in our office performed yesterday revealed a left ABI 0.6 with an occluded left SFA and one-vessel runoff. She will need to be admitted for angiography and potential endovascular therapy for critical limb ischemia. Angiogram to her on 11/03/14 revealing occluded left SFA. I performed [facility] one directional atherectomy, PTA and stenting using a Viabahn covered stent with an excellent angiographic and clinical result. Her pain has resolved. Her critical limb ischemia has resolved. Her Dopplers have normalized. Since I saw her approximately 6 weeks ago she's had 4 episodes of night nitroglycerin responsive chest pain. Recent Dopplers did show a decline in her left ABI from 1.1 6 months ago to .91 with a simultaneous increase in velocity in her mid left SFA. Dopplers  performed 12/20/15 revealed a further reduction in her left ABI down to 0.71 with a high-frequency signal in her mid left SFA and worsening symptoms of claudication.   Current Outpatient Prescriptions  Medication Sig Dispense Refill  . acetaminophen (TYLENOL) 325 MG tablet Take 2 tablets (650 mg total) by mouth every 4 (four) hours as needed for headache or mild pain.    Marland Kitchen albuterol (PROAIR HFA) 108 (90 Base) MCG/ACT inhaler 1-2 inhalations every 4-6 hours as needed for cough or wheeze. 1 Inhaler 1  . ALPRAZolam (XANAX) 0.5 MG tablet Take 0.25-0.5 mg by mouth 2 (two) times daily as needed for anxiety.     Marland Kitchen aspirin EC 325 MG EC tablet Take 1 tablet (325 mg total) by mouth daily. 30 tablet 0  . Azelastine HCl 0.15 % SOLN Place 2 sprays into both nostrils 2 (two) times daily. 30 mL 5  . b complex vitamins tablet Take 1 tablet by mouth daily.    . Bromfenac Sodium (PROLENSA) 0.07 % SOLN Apply to eye.    . Calcium Carb-Cholecalciferol (CALCIUM 600 + D PO) Take 1 tablet by mouth daily.    . celecoxib (CELEBREX) 200 MG capsule Take 200 mg by mouth daily as needed for mild pain. Reported on 07/10/2015  1  . citalopram (CELEXA) 10 MG tablet Take 10 mg by mouth daily.    . clopidogrel (PLAVIX) 75 MG tablet Take 75 mg by mouth at bedtime.     . dicyclomine (BENTYL) 20 MG tablet Take 1 tablet (20 mg total) by mouth 3 (three) times daily before meals. 90 tablet 1  .  EPINEPHrine 0.3 mg/0.3 mL IJ SOAJ injection Inject 0.3 mLs (0.3 mg total) into the muscle once. 1 Device 1  . ibuprofen (ADVIL,MOTRIN) 800 MG tablet Take 800 mg by mouth every 8 (eight) hours as needed.    . insulin aspart (NOVOLOG) 100 UNIT/ML injection Inject 5 Units into the skin 3 (three) times daily before meals.    . Insulin Glargine-Lixisenatide (SOLIQUA) 100-33 UNT-MCG/ML SOPN Inject 21 Units into the skin daily.    Marland Kitchen loratadine (CLARITIN) 10 MG tablet Take 10 mg by mouth daily as needed for allergies.    . mometasone (NASONEX) 50 MCG/ACT  nasal spray 1-2 sprays in each nostril once daily for stuffy nose or drainage. 17 g 5  . mupirocin cream (BACTROBAN) 2 % Apply 1 application topically 2 (two) times daily as needed (wound care (boils)).     . nitroGLYCERIN (NITRODUR - DOSED IN MG/24 HR) 0.4 mg/hr Place 0.4 mg onto the skin as needed (chest pain). Reported on 07/10/2015    . nitroGLYCERIN (NITROSTAT) 0.4 MG SL tablet Place 1 tablet (0.4 mg total) under the tongue every 5 (five) minutes as needed for chest pain. Maximum 3 doses 25 tablet 12  . OFLOXACIN PO Take by mouth.    . Olmesartan-Amlodipine-HCTZ (TRIBENZOR) 20-5-12.5 MG TABS Take 1 tablet by mouth daily.    . ONE TOUCH ULTRA TEST test strip     . pantoprazole (PROTONIX) 40 MG tablet TAKE 1 TABLET (40 MG TOTAL) BY MOUTH DAILY. 90 tablet 2  . potassium chloride SA (K-DUR,KLOR-CON) 20 MEQ tablet Take 20 mEq by mouth daily as needed (for cramps).    . ranitidine (ZANTAC) 150 MG tablet Use one tablet one to two times daily as directed. 60 tablet 5  . rosuvastatin (CRESTOR) 40 MG tablet Take 40 mg by mouth daily.    . SUMAtriptan (IMITREX) 50 MG tablet Take 50 mg by mouth every 2 (two) hours as needed for migraine.    . TRULICITY A999333 0000000 SOPN     . zolpidem (AMBIEN) 10 MG tablet Take 5 mg by mouth at bedtime as needed for sleep.     No current facility-administered medications for this visit.     Allergies  Allergen Reactions  . Bactrim [Sulfamethoxazole-Trimethoprim] Shortness Of Breath  . Sulfa Antibiotics Shortness Of Breath and Palpitations  . 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 Fish Allergy Hives and Swelling    Tongue swelling  . Iodine Swelling  . Metformin And Related Diarrhea  . Adhesive [Tape] Rash    Paper tape is ok  . Shellfish Allergy Swelling and Rash    Tongue swelling    Social History   Social History  . Marital status: Divorced    Spouse name: N/A  . Number of children: 1  . Years of  education: N/A   Occupational History  .  Unemployed   Social History Main Topics  . Smoking status: Current Every Day Smoker    Packs/day: 0.33    Years: 41.00    Types: Cigarettes  . Smokeless tobacco: Never Used     Comment: Getting ready to start nicotine patches RX by Dr. Gwenlyn Found per pt.  . Alcohol use No  . Drug use: No     Comment: 05/09/2015.  "quit 07/27/1994"  . Sexual activity: Not Currently    Birth control/ protection: Abstinence   Other Topics Concern  . Not on file   Social History Narrative  .  No narrative on file     Review of Systems: General: negative for chills, fever, night sweats or weight changes.  Cardiovascular: negative for chest pain, dyspnea on exertion, edema, orthopnea, palpitations, paroxysmal nocturnal dyspnea or shortness of breath Dermatological: negative for rash Respiratory: negative for cough or wheezing Urologic: negative for hematuria Abdominal: negative for nausea, vomiting, diarrhea, bright red blood per rectum, melena, or hematemesis Neurologic: negative for visual changes, syncope, or dizziness All other systems reviewed and are otherwise negative except as noted above.    Blood pressure 114/74, pulse 98, height 5\' 4"  (1.626 m), weight 136 lb (61.7 kg).  General appearance: alert and no distress Neck: no adenopathy, no carotid bruit, no JVD, supple, symmetrical, trachea midline and thyroid not enlarged, symmetric, no tenderness/mass/nodules Lungs: clear to auscultation bilaterally Heart: regular rate and rhythm, S1, S2 normal, no murmur, click, rub or gallop Extremities: extremities normal, atraumatic, no cyanosis or edema  EKG sinus rhythm at 98 with septal Q waves. I personally reviewed his EKG  ASSESSMENT AND PLAN:   Hyperlipidemia LDL goal <70 History of hyperlipidemia on statin therapy followed by her PCP  Essential hypertension History of hypertension with blood pressure is 114/74. She is on olmesartan, amlodipine and  hydrochlorothiazide. Continue current meds at current dosing  Chest pain at rest History of atypical chest pain. Patient is under a lot of stress. Myoview stress test performed in March of this year was nonischemic.  Peripheral arterial disease History of peripheral arterial disease with critical limb ischemia status post peripheral intervention by myself 11/03/14 with recanalization of a left SFA CTO using a Viabahn covered stent. Her ABI did normalize as did her symptoms. She has continued to smoke. Her ABI has fallen from 1 down to 0.7 and she has a new high-frequency signal in her mid to distal left SFA with recurrent symptoms. She is on dual antiplatelet  Therapy. I will arrange for her to undergo repeat angiography and intervention.      Jennifer Harp MD FACP,FACC,FAHA, [facility] 12/27/2015 9:51 AM

## 2015-12-27 NOTE — Assessment & Plan Note (Signed)
History of hypertension with blood pressure is 114/74. She is on olmesartan, amlodipine and hydrochlorothiazide. Continue current meds at current dosing

## 2015-12-28 NOTE — Addendum Note (Signed)
Addended by: Vanessa Ralphs on: 12/28/2015 02:43 PM   Modules accepted: Orders

## 2016-01-09 DIAGNOSIS — E559 Vitamin D deficiency, unspecified: Secondary | ICD-10-CM | POA: Diagnosis not present

## 2016-01-09 DIAGNOSIS — D519 Vitamin B12 deficiency anemia, unspecified: Secondary | ICD-10-CM | POA: Diagnosis not present

## 2016-01-09 DIAGNOSIS — E1140 Type 2 diabetes mellitus with diabetic neuropathy, unspecified: Secondary | ICD-10-CM | POA: Diagnosis not present

## 2016-01-09 DIAGNOSIS — E782 Mixed hyperlipidemia: Secondary | ICD-10-CM | POA: Diagnosis not present

## 2016-01-11 IMAGING — DX DG FOOT COMPLETE 3+V*R*
3 series · 3 of 3 positions shown · non-contrast
Comparison: Right ankle radiographs performed 08/13/2004

CLINICAL DATA: Chronic bilateral foot pain for 2 months. Initial
encounter.

EXAM:
RIGHT FOOT COMPLETE - 3+ VIEW

[foot ap]
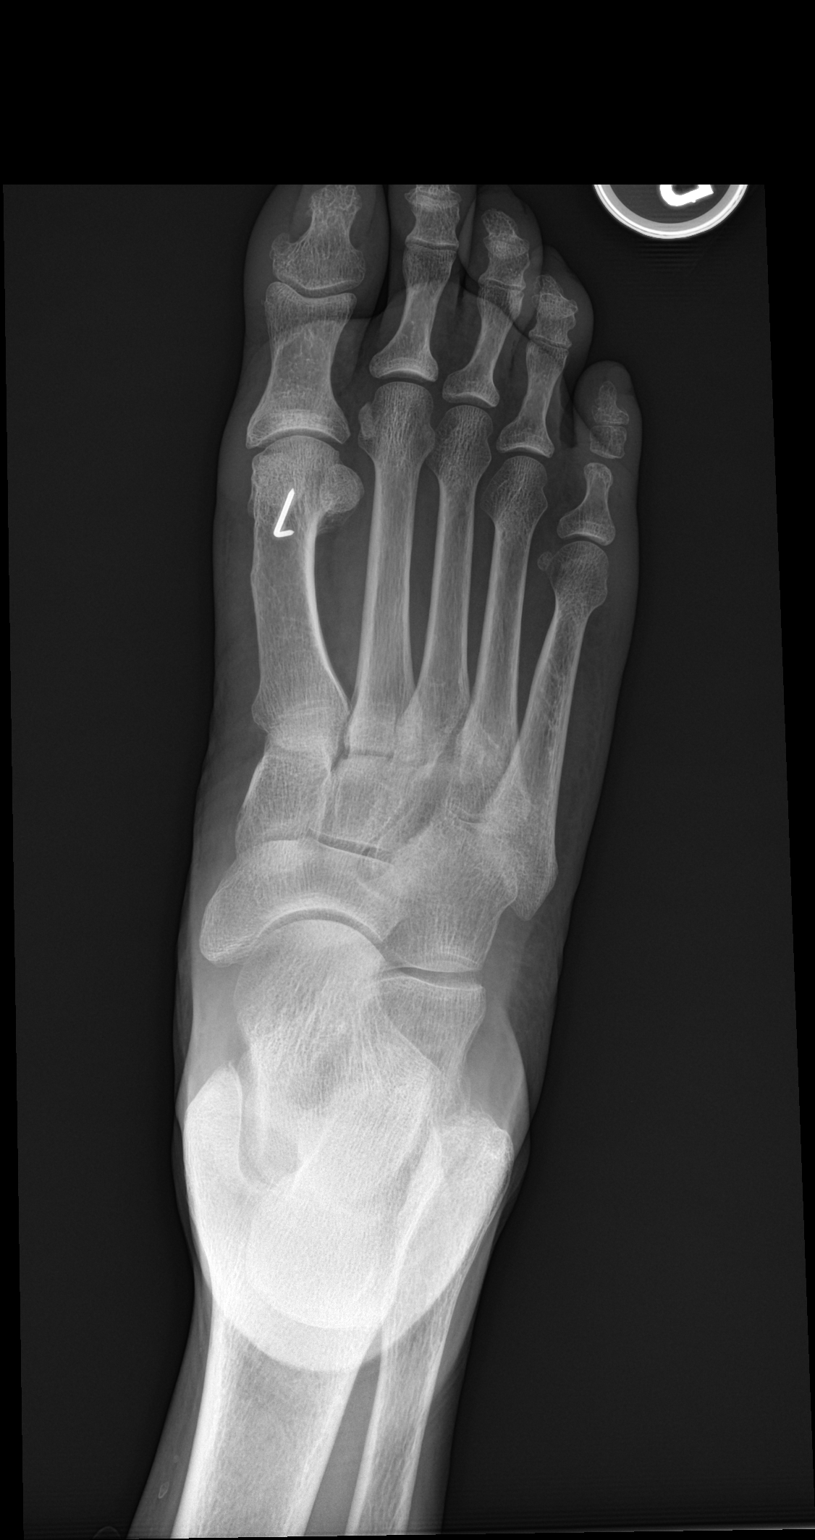

[foot obl]
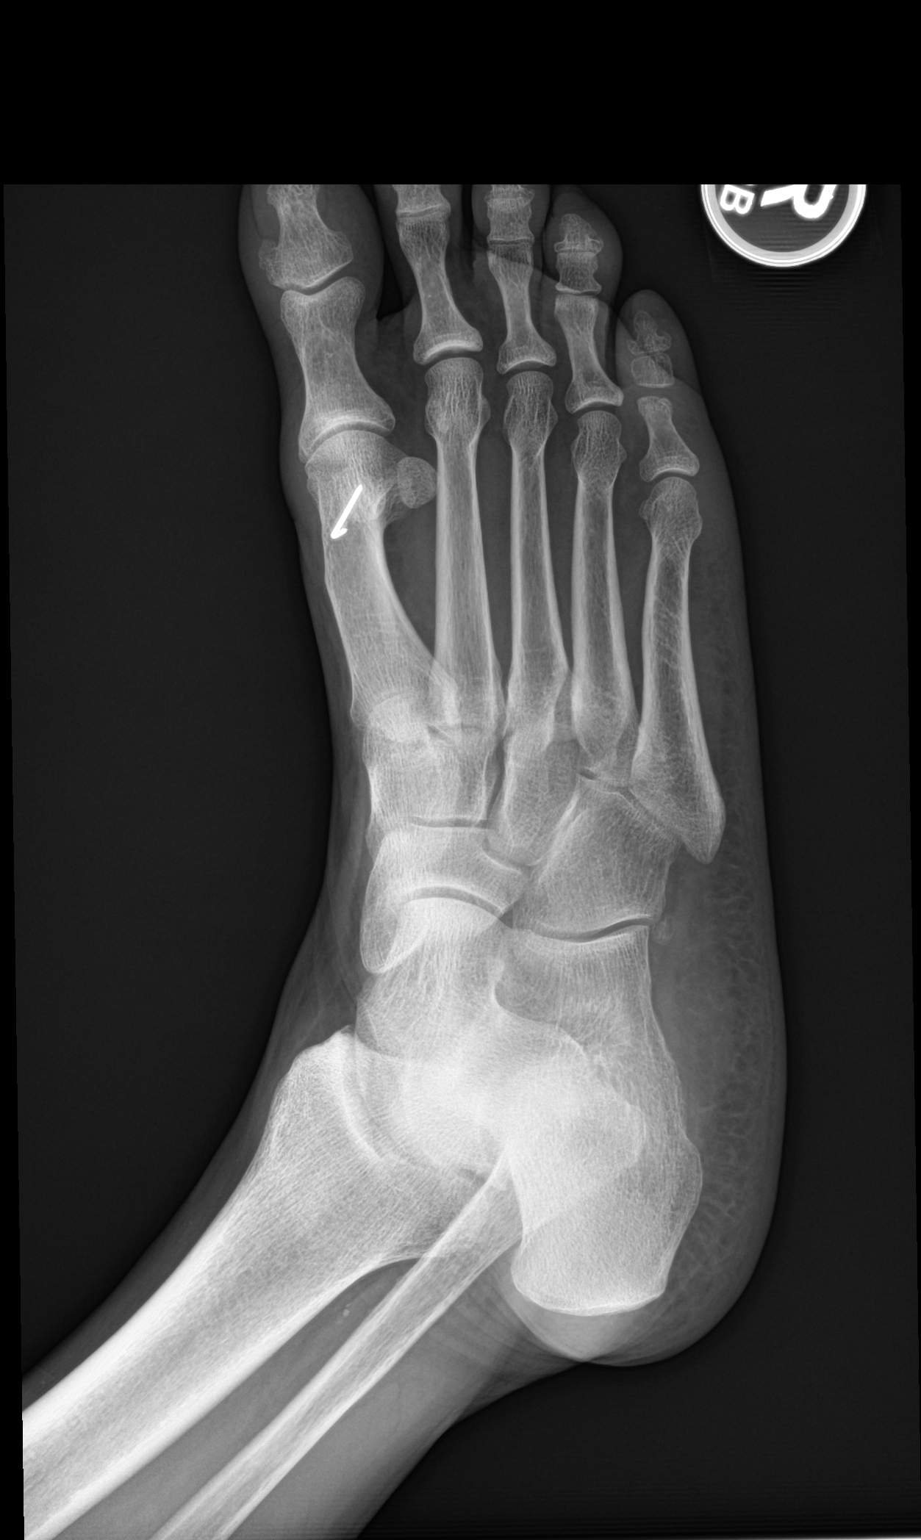

[foot lat]
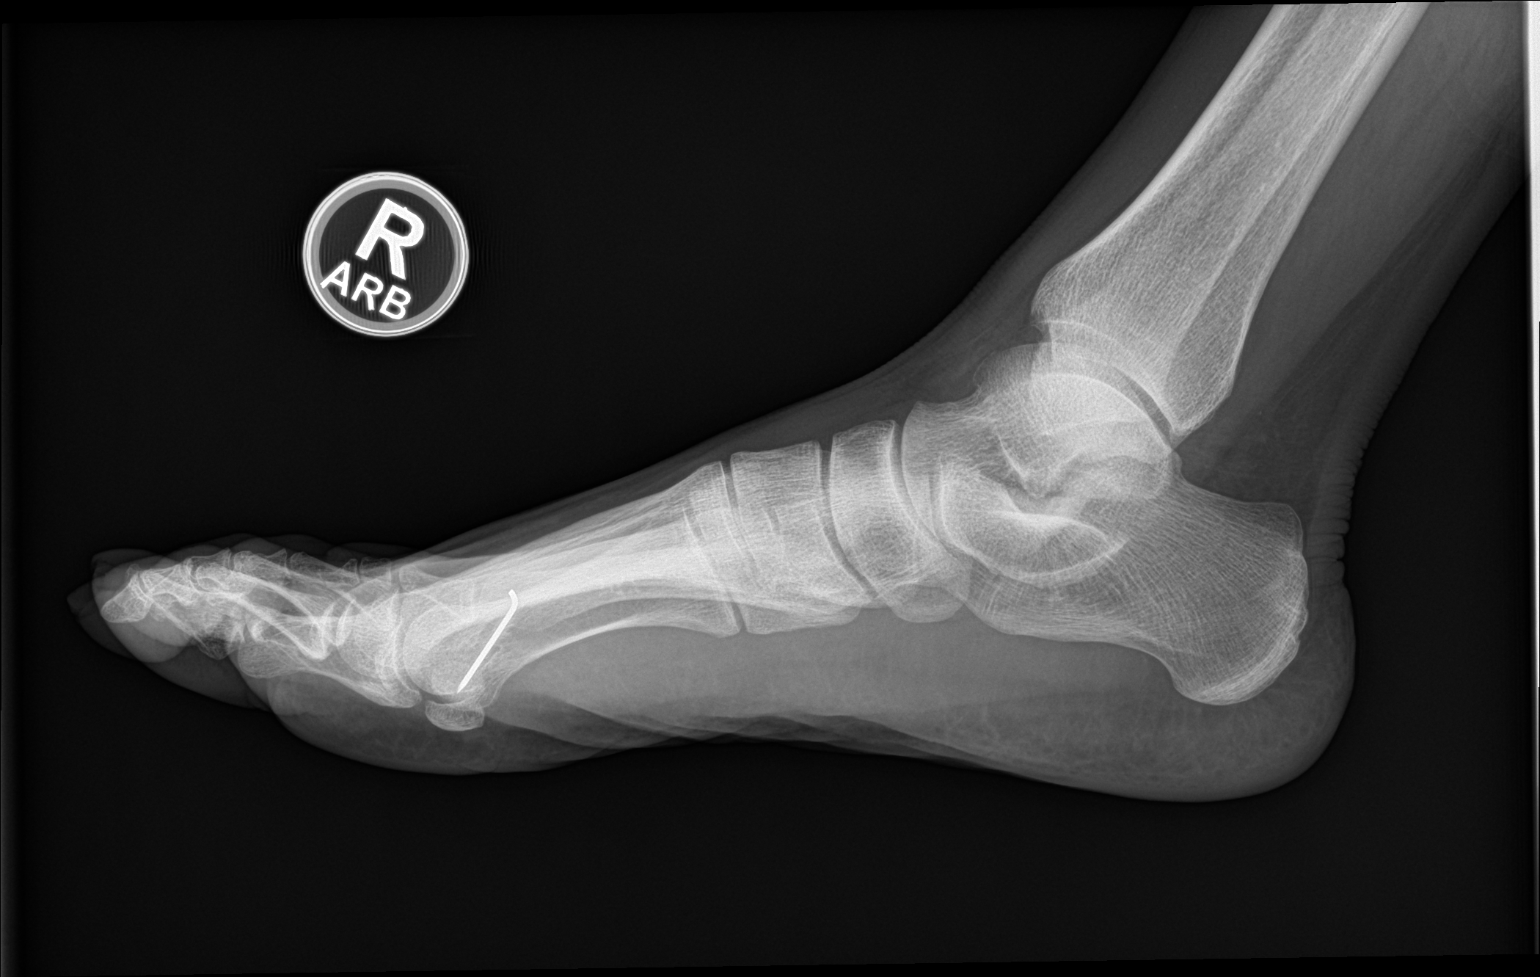

[3 of 3 positions shown; findings below may reference images not displayed]

FINDINGS: There is no evidence of fracture or dislocation. The pin within the
distal first metatarsal is unremarkable in appearance. The joint
spaces are preserved. There is no evidence of talar subluxation; the
subtalar joint is unremarkable in appearance.

No significant soft tissue abnormalities are seen.
IMPRESSION: No evidence of fracture or dislocation.

## 2016-01-17 DIAGNOSIS — E782 Mixed hyperlipidemia: Secondary | ICD-10-CM | POA: Diagnosis not present

## 2016-01-17 DIAGNOSIS — Z720 Tobacco use: Secondary | ICD-10-CM | POA: Diagnosis not present

## 2016-01-17 DIAGNOSIS — E1140 Type 2 diabetes mellitus with diabetic neuropathy, unspecified: Secondary | ICD-10-CM | POA: Diagnosis not present

## 2016-01-17 DIAGNOSIS — G589 Mononeuropathy, unspecified: Secondary | ICD-10-CM | POA: Diagnosis not present

## 2016-01-17 DIAGNOSIS — I10 Essential (primary) hypertension: Secondary | ICD-10-CM | POA: Diagnosis not present

## 2016-01-18 ENCOUNTER — Ambulatory Visit
Admission: RE | Admit: 2016-01-18 | Discharge: 2016-01-18 | Disposition: A | Discharge status: Home / Self Care | Payer: Medicare Other | Source: Ambulatory Visit | Attending: Cardiovascular Disease | Admitting: Cardiovascular Disease

## 2016-01-18 DIAGNOSIS — D689 Coagulation defect, unspecified: Secondary | ICD-10-CM | POA: Diagnosis not present

## 2016-01-18 DIAGNOSIS — I739 Peripheral vascular disease, unspecified: Secondary | ICD-10-CM | POA: Diagnosis not present

## 2016-01-18 DIAGNOSIS — Z87891 Personal history of nicotine dependence: Secondary | ICD-10-CM | POA: Diagnosis not present

## 2016-01-18 DIAGNOSIS — Z01818 Encounter for other preprocedural examination: Secondary | ICD-10-CM

## 2016-01-18 DIAGNOSIS — Z79899 Other long term (current) drug therapy: Secondary | ICD-10-CM | POA: Diagnosis not present

## 2016-01-18 LAB — APTT: aPTT: 28 s (ref 22–34)

## 2016-01-18 LAB — PROTIME-INR
INR: 1
Prothrombin Time: 10.8 s (ref 9.0–11.5)

## 2016-01-19 ENCOUNTER — Telehealth: Payer: Self-pay | Admitting: *Deleted

## 2016-01-19 LAB — TSH: TSH: 0.52 mIU/L

## 2016-01-19 LAB — CBC WITH DIFFERENTIAL/PLATELET
Basophils Absolute: 0 cells/uL (ref 0–200)
Basophils Relative: 0 %
Eosinophils Absolute: 53 cells/uL (ref 15–500)
Eosinophils Relative: 1 %
HCT: 38.4 % (ref 35.0–45.0)
Hemoglobin: 12.5 g/dL (ref 11.7–15.5)
Lymphocytes Relative: 41 %
Lymphs Abs: 2173 cells/uL (ref 850–3900)
MCH: 27.9 pg (ref 27.0–33.0)
MCHC: 32.6 g/dL (ref 32.0–36.0)
MCV: 85.7 fL (ref 80.0–100.0)
MPV: 10.1 fL (ref 7.5–12.5)
Monocytes Absolute: 477 cells/uL (ref 200–950)
Monocytes Relative: 9 %
Neutro Abs: 2597 cells/uL (ref 1500–7800)
Neutrophils Relative %: 49 %
Platelets: 244 10*3/uL (ref 140–400)
RBC: 4.48 MIL/uL (ref 3.80–5.10)
RDW: 13.2 % (ref 11.0–15.0)
WBC: 5.3 10*3/uL (ref 3.8–10.8)

## 2016-01-19 LAB — BASIC METABOLIC PANEL
BUN: 11 mg/dL (ref 7–25)
CO2: 27 mmol/L (ref 20–31)
Calcium: 9.9 mg/dL (ref 8.6–10.4)
Chloride: 105 mmol/L (ref 98–110)
Creat: 0.86 mg/dL (ref 0.50–0.99)
Glucose, Bld: 200 mg/dL — ABNORMAL HIGH (ref 65–99)
Potassium: 4.5 mmol/L (ref 3.5–5.3)
Sodium: 143 mmol/L (ref 135–146)

## 2016-01-19 NOTE — Telephone Encounter (Signed)
Added new medications to med list prescribed by PCP

## 2016-01-19 NOTE — Telephone Encounter (Signed)
Advised patient of lab results and chest xray   Patient also wanted for Dr Gwenlyn Found to know that her last cigarette was on 01/15/16

## 2016-01-19 NOTE — Telephone Encounter (Signed)
-----   Message from Lorretta Harp, MD sent at 01/18/2016  5:52 PM EDT ----- NAD

## 2016-01-19 NOTE — Telephone Encounter (Signed)
-----   Message from Lorretta Harp, MD sent at 01/19/2016 10:47 AM EDT ----- Labs OK except for increased Glu and borderline low TSH

## 2016-01-29 ENCOUNTER — Ambulatory Visit (HOSPITAL_COMMUNITY)
Admission: RE | Admit: 2016-01-29 | Discharge: 2016-01-31 | Disposition: A | Discharge status: Home / Self Care | Payer: Medicare Other | Source: Ambulatory Visit | Attending: Cardiovascular Disease | Admitting: Cardiovascular Disease

## 2016-01-29 ENCOUNTER — Encounter (HOSPITAL_COMMUNITY)
Admission: RE | Disposition: A | Discharge status: Home / Self Care | Payer: Self-pay | Source: Ambulatory Visit | Attending: Cardiovascular Disease

## 2016-01-29 ENCOUNTER — Encounter (HOSPITAL_COMMUNITY): Payer: Self-pay | Admitting: *Deleted

## 2016-01-29 DIAGNOSIS — E119 Type 2 diabetes mellitus without complications: Secondary | ICD-10-CM

## 2016-01-29 DIAGNOSIS — E1151 Type 2 diabetes mellitus with diabetic peripheral angiopathy without gangrene: Secondary | ICD-10-CM | POA: Insufficient documentation

## 2016-01-29 DIAGNOSIS — Z8719 Personal history of other diseases of the digestive system: Secondary | ICD-10-CM | POA: Insufficient documentation

## 2016-01-29 DIAGNOSIS — I2510 Atherosclerotic heart disease of native coronary artery without angina pectoris: Secondary | ICD-10-CM | POA: Diagnosis not present

## 2016-01-29 DIAGNOSIS — Z8041 Family history of malignant neoplasm of ovary: Secondary | ICD-10-CM | POA: Diagnosis not present

## 2016-01-29 DIAGNOSIS — M1990 Unspecified osteoarthritis, unspecified site: Secondary | ICD-10-CM | POA: Insufficient documentation

## 2016-01-29 DIAGNOSIS — I69354 Hemiplegia and hemiparesis following cerebral infarction affecting left non-dominant side: Secondary | ICD-10-CM | POA: Insufficient documentation

## 2016-01-29 DIAGNOSIS — J45909 Unspecified asthma, uncomplicated: Secondary | ICD-10-CM | POA: Insufficient documentation

## 2016-01-29 DIAGNOSIS — E1140 Type 2 diabetes mellitus with diabetic neuropathy, unspecified: Secondary | ICD-10-CM | POA: Diagnosis not present

## 2016-01-29 DIAGNOSIS — Z7982 Long term (current) use of aspirin: Secondary | ICD-10-CM | POA: Diagnosis not present

## 2016-01-29 DIAGNOSIS — E785 Hyperlipidemia, unspecified: Secondary | ICD-10-CM | POA: Diagnosis not present

## 2016-01-29 DIAGNOSIS — Z87891 Personal history of nicotine dependence: Secondary | ICD-10-CM | POA: Diagnosis not present

## 2016-01-29 DIAGNOSIS — I10 Essential (primary) hypertension: Secondary | ICD-10-CM | POA: Diagnosis not present

## 2016-01-29 DIAGNOSIS — Z6823 Body mass index (BMI) 23.0-23.9, adult: Secondary | ICD-10-CM | POA: Diagnosis not present

## 2016-01-29 DIAGNOSIS — E669 Obesity, unspecified: Secondary | ICD-10-CM

## 2016-01-29 DIAGNOSIS — I70213 Atherosclerosis of native arteries of extremities with intermittent claudication, bilateral legs: Secondary | ICD-10-CM | POA: Diagnosis not present

## 2016-01-29 DIAGNOSIS — R0789 Other chest pain: Secondary | ICD-10-CM | POA: Insufficient documentation

## 2016-01-29 DIAGNOSIS — G43909 Migraine, unspecified, not intractable, without status migrainosus: Secondary | ICD-10-CM | POA: Insufficient documentation

## 2016-01-29 DIAGNOSIS — Z7902 Long term (current) use of antithrombotics/antiplatelets: Secondary | ICD-10-CM | POA: Insufficient documentation

## 2016-01-29 DIAGNOSIS — I739 Peripheral vascular disease, unspecified: Secondary | ICD-10-CM | POA: Diagnosis present

## 2016-01-29 DIAGNOSIS — Z794 Long term (current) use of insulin: Secondary | ICD-10-CM | POA: Diagnosis not present

## 2016-01-29 DIAGNOSIS — R079 Chest pain, unspecified: Secondary | ICD-10-CM | POA: Diagnosis present

## 2016-01-29 HISTORY — DX: Atherosclerotic heart disease of native coronary artery without angina pectoris: I25.10

## 2016-01-29 HISTORY — DX: Peripheral vascular disease, unspecified: I73.9

## 2016-01-29 HISTORY — PX: PERIPHERAL VASCULAR CATHETERIZATION: SHX172C

## 2016-01-29 LAB — GLUCOSE, CAPILLARY
Glucose-Capillary: 126 mg/dL — ABNORMAL HIGH (ref 65–99)
Glucose-Capillary: 129 mg/dL — ABNORMAL HIGH (ref 65–99)
Glucose-Capillary: 288 mg/dL — ABNORMAL HIGH (ref 65–99)
Glucose-Capillary: 284 mg/dL — ABNORMAL HIGH (ref 65–99)

## 2016-01-29 SURGERY — LOWER EXTREMITY ANGIOGRAPHY

## 2016-01-29 MED ORDER — METHYLPREDNISOLONE SODIUM SUCC 125 MG IJ SOLR
INTRAMUSCULAR | Status: AC
  Administered 2016-01-29: 125 mg via INTRAVENOUS
  Filled 2016-01-29: qty 2

## 2016-01-29 MED ORDER — CITALOPRAM HYDROBROMIDE 20 MG PO TABS
10.0000 mg | ORAL_TABLET | Freq: Every day | ORAL | Status: DC
  Administered 2016-01-29 – 2016-01-31 (×3): 10 mg via ORAL
  Filled 2016-01-29 (×3): qty 1

## 2016-01-29 MED ORDER — IODIXANOL 320 MG/ML IV SOLN
INTRAVENOUS | Status: DC | PRN
  Administered 2016-01-29: 106 mL via INTRA_ARTERIAL

## 2016-01-29 MED ORDER — FENTANYL CITRATE (PF) 100 MCG/2ML IJ SOLN
INTRAMUSCULAR | Status: AC
  Filled 2016-01-29: qty 2

## 2016-01-29 MED ORDER — LORATADINE 10 MG PO TABS: 10.0000 mg | ORAL_TABLET | Freq: Every day | ORAL | Status: DC | PRN

## 2016-01-29 MED ORDER — NITROGLYCERIN 0.4 MG SL SUBL
0.4000 mg | SUBLINGUAL_TABLET | SUBLINGUAL | Status: DC | PRN
  Administered 2016-01-29 – 2016-01-30 (×3): 0.4 mg via SUBLINGUAL
  Filled 2016-01-29 (×2): qty 1

## 2016-01-29 MED ORDER — POTASSIUM CHLORIDE CRYS ER 20 MEQ PO TBCR: 20.0000 meq | EXTENDED_RELEASE_TABLET | Freq: Every day | ORAL | Status: DC | PRN

## 2016-01-29 MED ORDER — ONDANSETRON HCL 4 MG/2ML IJ SOLN: 4.0000 mg | Freq: Four times a day (QID) | INTRAMUSCULAR | Status: DC | PRN

## 2016-01-29 MED ORDER — AMLODIPINE BESYLATE 5 MG PO TABS
5.0000 mg | ORAL_TABLET | Freq: Every day | ORAL | Status: DC
  Administered 2016-01-30 – 2016-01-31 (×2): 5 mg via ORAL
  Filled 2016-01-29 (×2): qty 1

## 2016-01-29 MED ORDER — FAMOTIDINE IN NACL 20-0.9 MG/50ML-% IV SOLN
INTRAVENOUS | Status: AC
  Filled 2016-01-29: qty 50

## 2016-01-29 MED ORDER — OLMESARTAN-AMLODIPINE-HCTZ 20-5-12.5 MG PO TABS: 1.0000 | ORAL_TABLET | Freq: Every day | ORAL | Status: DC

## 2016-01-29 MED ORDER — DULAGLUTIDE 0.75 MG/0.5ML ~~LOC~~ SOAJ: 0.7500 mg | SUBCUTANEOUS | Status: DC

## 2016-01-29 MED ORDER — IRBESARTAN 150 MG PO TABS
150.0000 mg | ORAL_TABLET | Freq: Every day | ORAL | Status: DC
  Administered 2016-01-30 – 2016-01-31 (×2): 150 mg via ORAL
  Filled 2016-01-29 (×2): qty 1

## 2016-01-29 MED ORDER — FAMOTIDINE IN NACL 20-0.9 MG/50ML-% IV SOLN: 20.0000 mg | Freq: Once | INTRAVENOUS | Status: DC

## 2016-01-29 MED ORDER — SUMATRIPTAN SUCCINATE 50 MG PO TABS
50.0000 mg | ORAL_TABLET | ORAL | Status: DC | PRN
  Administered 2016-01-29 (×2): 50 mg via ORAL
  Filled 2016-01-29 (×3): qty 1

## 2016-01-29 MED ORDER — IBUPROFEN 800 MG PO TABS
800.0000 mg | ORAL_TABLET | Freq: Three times a day (TID) | ORAL | Status: DC | PRN
  Administered 2016-01-29 – 2016-01-30 (×2): 800 mg via ORAL
  Filled 2016-01-29 (×2): qty 1

## 2016-01-29 MED ORDER — ASPIRIN EC 81 MG PO TBEC
81.0000 mg | DELAYED_RELEASE_TABLET | Freq: Every day | ORAL | Status: DC
  Administered 2016-01-30 – 2016-01-31 (×2): 81 mg via ORAL
  Filled 2016-01-29 (×2): qty 1

## 2016-01-29 MED ORDER — B COMPLEX PO TABS: 1.0000 | ORAL_TABLET | Freq: Every day | ORAL | Status: DC

## 2016-01-29 MED ORDER — ROSUVASTATIN CALCIUM 10 MG PO TABS
40.0000 mg | ORAL_TABLET | Freq: Every day | ORAL | Status: DC
  Administered 2016-01-29 – 2016-01-30 (×2): 40 mg via ORAL
  Filled 2016-01-29 (×2): qty 4

## 2016-01-29 MED ORDER — LIDOCAINE HCL (PF) 1 % IJ SOLN
INTRAMUSCULAR | Status: AC
  Filled 2016-01-29: qty 30

## 2016-01-29 MED ORDER — ALPRAZOLAM 0.25 MG PO TABS
0.2500 mg | ORAL_TABLET | Freq: Every evening | ORAL | Status: DC | PRN
  Administered 2016-01-29: 0.5 mg via ORAL
  Filled 2016-01-29: qty 2

## 2016-01-29 MED ORDER — ALBUTEROL SULFATE (2.5 MG/3ML) 0.083% IN NEBU: 2.5000 mg | INHALATION_SOLUTION | Freq: Four times a day (QID) | RESPIRATORY_TRACT | Status: DC | PRN

## 2016-01-29 MED ORDER — EPINEPHRINE 0.3 MG/0.3ML IJ SOAJ
0.3000 mg | Freq: Once | INTRAMUSCULAR | Status: DC | PRN
  Filled 2016-01-29: qty 0.3

## 2016-01-29 MED ORDER — DIPHENHYDRAMINE HCL 50 MG/ML IJ SOLN
25.0000 mg | Freq: Once | INTRAMUSCULAR | Status: AC
  Administered 2016-01-29: 25 mg via INTRAVENOUS

## 2016-01-29 MED ORDER — SODIUM CHLORIDE 0.9 % WEIGHT BASED INFUSION: 3.0000 mL/kg/h | INTRAVENOUS | Status: DC

## 2016-01-29 MED ORDER — INSULIN ASPART 100 UNIT/ML ~~LOC~~ SOLN
5.0000 [IU] | Freq: Three times a day (TID) | SUBCUTANEOUS | Status: DC
  Administered 2016-01-29: 10 [IU] via SUBCUTANEOUS

## 2016-01-29 MED ORDER — INSULIN GLARGINE 100 UNIT/ML ~~LOC~~ SOLN
30.0000 [IU] | Freq: Every morning | SUBCUTANEOUS | Status: DC
  Administered 2016-01-30 – 2016-01-31 (×2): 30 [IU] via SUBCUTANEOUS
  Filled 2016-01-29 (×2): qty 0.3

## 2016-01-29 MED ORDER — B COMPLEX-C PO TABS
1.0000 | ORAL_TABLET | Freq: Every day | ORAL | Status: DC
  Administered 2016-01-30 – 2016-01-31 (×2): 1 via ORAL
  Filled 2016-01-29 (×2): qty 1

## 2016-01-29 MED ORDER — SODIUM CHLORIDE 0.9 % IV SOLN
INTRAVENOUS | Status: DC
  Administered 2016-01-29 – 2016-01-30 (×2): via INTRAVENOUS

## 2016-01-29 MED ORDER — HEPARIN (PORCINE) IN NACL 2-0.9 UNIT/ML-% IJ SOLN
INTRAMUSCULAR | Status: DC | PRN
  Administered 2016-01-29: 1000 mL

## 2016-01-29 MED ORDER — NITROGLYCERIN 0.4 MG/HR TD PT24
0.4000 mg | MEDICATED_PATCH | TRANSDERMAL | Status: DC | PRN
  Filled 2016-01-29: qty 1

## 2016-01-29 MED ORDER — DOXYCYCLINE HYCLATE 100 MG PO TABS
100.0000 mg | ORAL_TABLET | Freq: Every day | ORAL | Status: DC
  Administered 2016-01-30 – 2016-01-31 (×2): 100 mg via ORAL
  Filled 2016-01-29 (×2): qty 1

## 2016-01-29 MED ORDER — AZELASTINE HCL 0.1 % NA SOLN
2.0000 | Freq: Two times a day (BID) | NASAL | Status: DC
  Administered 2016-01-29 – 2016-01-31 (×4): 2 via NASAL
  Filled 2016-01-29: qty 30

## 2016-01-29 MED ORDER — FENTANYL CITRATE (PF) 100 MCG/2ML IJ SOLN
INTRAMUSCULAR | Status: DC | PRN
  Administered 2016-01-29: 25 ug via INTRAVENOUS

## 2016-01-29 MED ORDER — MIDAZOLAM HCL 2 MG/2ML IJ SOLN
INTRAMUSCULAR | Status: AC
  Filled 2016-01-29: qty 2

## 2016-01-29 MED ORDER — SUMATRIPTAN SUCCINATE 50 MG PO TABS
50.0000 mg | ORAL_TABLET | Freq: Once | ORAL | Status: DC
  Filled 2016-01-29: qty 1

## 2016-01-29 MED ORDER — MIDAZOLAM HCL 2 MG/2ML IJ SOLN
INTRAMUSCULAR | Status: DC | PRN
  Administered 2016-01-29: 1 mg via INTRAVENOUS

## 2016-01-29 MED ORDER — ACETAMINOPHEN 325 MG PO TABS
ORAL_TABLET | ORAL | Status: AC
  Filled 2016-01-29: qty 2

## 2016-01-29 MED ORDER — ASPIRIN 81 MG PO CHEW
81.0000 mg | CHEWABLE_TABLET | ORAL | Status: AC
  Administered 2016-01-29: 81 mg via ORAL

## 2016-01-29 MED ORDER — ZOLPIDEM TARTRATE 5 MG PO TABS
5.0000 mg | ORAL_TABLET | Freq: Every evening | ORAL | Status: DC | PRN
  Administered 2016-01-31: 5 mg via ORAL
  Filled 2016-01-29: qty 1

## 2016-01-29 MED ORDER — NITROGLYCERIN 1 MG/10 ML FOR IR/CATH LAB
INTRA_ARTERIAL | Status: DC | PRN
  Administered 2016-01-29: 200 ug via INTRA_ARTERIAL

## 2016-01-29 MED ORDER — MECLIZINE HCL 25 MG PO TABS: 25.0000 mg | ORAL_TABLET | Freq: Three times a day (TID) | ORAL | Status: DC | PRN

## 2016-01-29 MED ORDER — CLOPIDOGREL BISULFATE 75 MG PO TABS
75.0000 mg | ORAL_TABLET | Freq: Every day | ORAL | Status: DC
  Administered 2016-01-30 – 2016-01-31 (×2): 75 mg via ORAL
  Filled 2016-01-29 (×2): qty 1

## 2016-01-29 MED ORDER — HYDRALAZINE HCL 20 MG/ML IJ SOLN: 10.0000 mg | INTRAMUSCULAR | Status: DC | PRN

## 2016-01-29 MED ORDER — SODIUM CHLORIDE 0.9% FLUSH: 3.0000 mL | INTRAVENOUS | Status: DC | PRN

## 2016-01-29 MED ORDER — METHYLPREDNISOLONE SODIUM SUCC 125 MG IJ SOLR
125.0000 mg | Freq: Once | INTRAMUSCULAR | Status: AC
  Administered 2016-01-29: 125 mg via INTRAVENOUS

## 2016-01-29 MED ORDER — SODIUM CHLORIDE 0.9 % WEIGHT BASED INFUSION: 1.0000 mL/kg/h | INTRAVENOUS | Status: DC

## 2016-01-29 MED ORDER — HEPARIN (PORCINE) IN NACL 2-0.9 UNIT/ML-% IJ SOLN
INTRAMUSCULAR | Status: AC
  Filled 2016-01-29: qty 1000

## 2016-01-29 MED ORDER — ACETAMINOPHEN 325 MG PO TABS
650.0000 mg | ORAL_TABLET | ORAL | Status: DC | PRN
  Administered 2016-01-29: 650 mg via ORAL

## 2016-01-29 MED ORDER — ASPIRIN 81 MG PO CHEW
CHEWABLE_TABLET | ORAL | Status: AC
  Administered 2016-01-29: 81 mg via ORAL
  Filled 2016-01-29: qty 1

## 2016-01-29 MED ORDER — HYDROCHLOROTHIAZIDE 12.5 MG PO CAPS
12.5000 mg | ORAL_CAPSULE | Freq: Every day | ORAL | Status: DC
  Administered 2016-01-30 – 2016-01-31 (×2): 12.5 mg via ORAL
  Filled 2016-01-29 (×2): qty 1

## 2016-01-29 MED ORDER — NITROGLYCERIN 1 MG/10 ML FOR IR/CATH LAB
INTRA_ARTERIAL | Status: AC
  Filled 2016-01-29: qty 10

## 2016-01-29 MED ORDER — DIPHENHYDRAMINE HCL 50 MG/ML IJ SOLN
INTRAMUSCULAR | Status: AC
  Administered 2016-01-29: 25 mg via INTRAVENOUS
  Filled 2016-01-29: qty 1

## 2016-01-29 MED ORDER — LIDOCAINE HCL (PF) 1 % IJ SOLN
INTRAMUSCULAR | Status: DC | PRN
  Administered 2016-01-29: 20 mL

## 2016-01-29 MED ORDER — INSULIN ASPART 100 UNIT/ML ~~LOC~~ SOLN
5.0000 [IU] | Freq: Three times a day (TID) | SUBCUTANEOUS | Status: DC
  Administered 2016-01-30 (×2): 8 [IU] via SUBCUTANEOUS
  Administered 2016-01-30: 10 [IU] via SUBCUTANEOUS
  Administered 2016-01-31: 5 [IU] via SUBCUTANEOUS

## 2016-01-29 SURGICAL SUPPLY — 11 items
CATH ANGIO 5F PIGTAIL 65CM (CATHETERS) ×2 IMPLANT
CATH STRAIGHT 5FR 65CM (CATHETERS) ×2 IMPLANT
KIT MICROINTRODUCER STIFF 5F (SHEATH) ×2 IMPLANT
KIT PV (KITS) ×2 IMPLANT
SHEATH PINNACLE 5F 10CM (SHEATH) ×2 IMPLANT
STOPCOCK MORSE 400PSI 3WAY (MISCELLANEOUS) ×2 IMPLANT
SYRINGE MEDRAD AVANTA MACH 7 (SYRINGE) ×2 IMPLANT
TRANSDUCER W/STOPCOCK (MISCELLANEOUS) ×2 IMPLANT
TRAY PV CATH (CUSTOM PROCEDURE TRAY) ×2 IMPLANT
TUBING CIL FLEX 10 FLL-RA (TUBING) ×2 IMPLANT
WIRE HITORQ VERSACORE ST 145CM (WIRE) ×2 IMPLANT

## 2016-01-29 NOTE — H&P (Signed)
[] Hide copied text     01/29/16 ELVEDA WIDEMAN   [birth year]  [id number]  Primary Physician Wende Neighbors, MD Primary Cardiologist: Lorretta Harp MD Renae Gloss  HPI:  Mrs. Rosengrant is a very pleasant 60 year old thin-appearing divorced African-American female mother of one, grandmother of one grandchild who is currently disabled because of a prior stroke. She was referred by Dr. Melony Overly, from [facility] podiatry, for evaluation and treatment of critical limb ischemia. I last saw her in the office 06/21/15. Her cardiovascular risk factor profile is notable for a strong family history of heart disease with the father about a stent, mother who had bypass surgery and a sister who died at age 43 of a myocardial infarction. She has never had a heart attack but apparently has had a stroke in the past with some mild left-sided residua. She has a history of tobacco abuse in the last 43 years of one third pack per day trying to quit currently. She has treated diabetes, hypertension and hyperlipidemia. She had the onset of left ear pain approximately 3 months ago with progression to critical limb ischemia in early June with ischemic appearing left fourth toe. Dopplers in our office performed yesterday revealed a left ABI 0.6 with an occluded left SFA and one-vessel runoff. She will need to be admitted for angiography and potential endovascular therapy for critical limb ischemia. Angiogram to her on 11/03/14 revealing occluded left SFA. I performed [facility] one directional atherectomy, PTA and stenting using a Viabahn covered stent with an excellent angiographic and clinical result. Her pain has resolved. Her critical limb ischemia has resolved. Her Dopplers have normalized. Since I saw her approximately 6 weeks ago she's had 4 episodes of night nitroglycerin responsive chest pain. Recent Dopplers did show a decline in her left ABI from 1.1 6 months ago to .91 with a simultaneous increase in velocity in  her mid left SFA. Dopplers performed 12/20/15 revealed a further reduction in her left ABI down to 0.71 with a high-frequency signal in her mid left SFA and worsening symptoms of claudication.         Current Outpatient Prescriptions  Medication Sig Dispense Refill  . acetaminophen (TYLENOL) 325 MG tablet Take 2 tablets (650 mg total) by mouth every 4 (four) hours as needed for headache or mild pain.    Marland Kitchen albuterol (PROAIR HFA) 108 (90 Base) MCG/ACT inhaler 1-2 inhalations every 4-6 hours as needed for cough or wheeze. 1 Inhaler 1  . ALPRAZolam (XANAX) 0.5 MG tablet Take 0.25-0.5 mg by mouth 2 (two) times daily as needed for anxiety.     Marland Kitchen aspirin EC 325 MG EC tablet Take 1 tablet (325 mg total) by mouth daily. 30 tablet 0  . Azelastine HCl 0.15 % SOLN Place 2 sprays into both nostrils 2 (two) times daily. 30 mL 5  . b complex vitamins tablet Take 1 tablet by mouth daily.    . Bromfenac Sodium (PROLENSA) 0.07 % SOLN Apply to eye.    . Calcium Carb-Cholecalciferol (CALCIUM 600 + D PO) Take 1 tablet by mouth daily.    . celecoxib (CELEBREX) 200 MG capsule Take 200 mg by mouth daily as needed for mild pain. Reported on 07/10/2015  1  . citalopram (CELEXA) 10 MG tablet Take 10 mg by mouth daily.    . clopidogrel (PLAVIX) 75 MG tablet Take 75 mg by mouth at bedtime.     . dicyclomine (BENTYL) 20 MG tablet Take 1 tablet (20 mg total) by  mouth 3 (three) times daily before meals. 90 tablet 1  . EPINEPHrine 0.3 mg/0.3 mL IJ SOAJ injection Inject 0.3 mLs (0.3 mg total) into the muscle once. 1 Device 1  . ibuprofen (ADVIL,MOTRIN) 800 MG tablet Take 800 mg by mouth every 8 (eight) hours as needed.    . insulin aspart (NOVOLOG) 100 UNIT/ML injection Inject 5 Units into the skin 3 (three) times daily before meals.    . Insulin Glargine-Lixisenatide (SOLIQUA) 100-33 UNT-MCG/ML SOPN Inject 21 Units into the skin daily.    Marland Kitchen loratadine (CLARITIN) 10 MG tablet Take 10 mg by mouth daily as  needed for allergies.    . mometasone (NASONEX) 50 MCG/ACT nasal spray 1-2 sprays in each nostril once daily for stuffy nose or drainage. 17 g 5  . mupirocin cream (BACTROBAN) 2 % Apply 1 application topically 2 (two) times daily as needed (wound care (boils)).     . nitroGLYCERIN (NITRODUR - DOSED IN MG/24 HR) 0.4 mg/hr Place 0.4 mg onto the skin as needed (chest pain). Reported on 07/10/2015    . nitroGLYCERIN (NITROSTAT) 0.4 MG SL tablet Place 1 tablet (0.4 mg total) under the tongue every 5 (five) minutes as needed for chest pain. Maximum 3 doses 25 tablet 12  . OFLOXACIN PO Take by mouth.    . Olmesartan-Amlodipine-HCTZ (TRIBENZOR) 20-5-12.5 MG TABS Take 1 tablet by mouth daily.    . ONE TOUCH ULTRA TEST test strip     . pantoprazole (PROTONIX) 40 MG tablet TAKE 1 TABLET (40 MG TOTAL) BY MOUTH DAILY. 90 tablet 2  . potassium chloride SA (K-DUR,KLOR-CON) 20 MEQ tablet Take 20 mEq by mouth daily as needed (for cramps).    . ranitidine (ZANTAC) 150 MG tablet Use one tablet one to two times daily as directed. 60 tablet 5  . rosuvastatin (CRESTOR) 40 MG tablet Take 40 mg by mouth daily.    . SUMAtriptan (IMITREX) 50 MG tablet Take 50 mg by mouth every 2 (two) hours as needed for migraine.    . TRULICITY [id number] SOPN     . zolpidem (AMBIEN) 10 MG tablet Take 5 mg by mouth at bedtime as needed for sleep.     No current facility-administered medications for this visit.          Allergies  Allergen Reactions  . Bactrim [Sulfamethoxazole-Trimethoprim] Shortness Of Breath  . Sulfa Antibiotics Shortness Of Breath and Palpitations  . 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 Fish Allergy Hives and Swelling    Tongue swelling  . Iodine Swelling  . Metformin And Related Diarrhea  . Adhesive [Tape] Rash    Paper tape is ok  . Shellfish Allergy Swelling and Rash    Tongue swelling    Social History          Social History  . Marital status: Divorced    Spouse name: N/A  . Number of children: 1  . Years of education: N/A       Occupational History  .  Unemployed         Social History Main Topics  . Smoking status: Current Every Day Smoker    Packs/day: 0.33    Years: 41.00    Types: Cigarettes  . Smokeless tobacco: Never Used     Comment: Getting ready to start nicotine patches RX by Dr. Gwenlyn Found per pt.  . Alcohol use No  . Drug use: No  Comment: 05/09/2015.  "quit 07/27/1994"  . Sexual activity: Not Currently    Birth control/ protection: Abstinence       Other Topics Concern  . Not on file      Social History Narrative  . No narrative on file     Review of Systems: General: negative for chills, fever, night sweats or weight changes.  Cardiovascular: negative for chest pain, dyspnea on exertion, edema, orthopnea, palpitations, paroxysmal nocturnal dyspnea or shortness of breath Dermatological: negative for rash Respiratory: negative for cough or wheezing Urologic: negative for hematuria Abdominal: negative for nausea, vomiting, diarrhea, bright red blood per rectum, melena, or hematemesis Neurologic: negative for visual changes, syncope, or dizziness All other systems reviewed and are otherwise negative except as noted above.    Blood pressure 114/74, pulse 98, height 5\' 4"  (1.626 m), weight 136 lb (61.7 kg).  General appearance: alert and no distress Neck: no adenopathy, no carotid bruit, no JVD, supple, symmetrical, trachea midline and thyroid not enlarged, symmetric, no tenderness/mass/nodules Lungs: clear to auscultation bilaterally Heart: regular rate and rhythm, S1, S2 normal, no murmur, click, rub or gallop Extremities: extremities normal, atraumatic, no cyanosis or edema  EKG sinus rhythm at 98 with septal Q waves. I personally reviewed his EKG  ASSESSMENT AND PLAN:   Hyperlipidemia LDL goal <70 History of hyperlipidemia on  statin therapy followed by her PCP  Essential hypertension History of hypertension with blood pressure is 114/74. She is on olmesartan, amlodipine and hydrochlorothiazide. Continue current meds at current dosing  Chest pain at rest History of atypical chest pain. Patient is under a lot of stress. Myoview stress test performed in March of this year was nonischemic.  Peripheral arterial disease History of peripheral arterial disease with critical limb ischemia status post peripheral intervention by myself 11/03/14 with recanalization of a left SFA CTO using a Viabahn covered stent. Her ABI did normalize as did her symptoms. She has continued to smoke. Her ABI has fallen from 1 down to 0.7 and she has a new high-frequency signal in her mid to distal left SFA with recurrent symptoms. She is on dual antiplatelet  Therapy.She presents today for PV angio and possible intervention.     Lorretta Harp, M.D., No Name, [facility], [place] [address]. [place], [place]  [address]  [phone] 01/29/2016 8:09 AM

## 2016-01-29 NOTE — Interval H&P Note (Signed)
History and Physical Interval Note:  01/29/2016 9:30 AM  [name]  has presented today for surgery, with the diagnosis of pad  The various methods of treatment have been discussed with the patient and family. After consideration of risks, benefits and other options for treatment, the patient has consented to  Procedure(s): Lower Extremity Angiography (N/A) as a surgical intervention .  The patient's history has been reviewed, patient examined, no change in status, stable for surgery.  I have reviewed the patient's chart and labs.  Questions were answered to the patient's satisfaction.     Quay Burow

## 2016-01-29 NOTE — Progress Notes (Signed)
Patient experienced episode of chest pain this evening, relieved mostly by 2 SL Nitro (refusing 3rd).  Also received a call from monitor techs that patient is in Sinus Arrhythmia with tachy-brady fluctuations from 50-100s.  Notified Dr. Aundra Dubin and he instructed to make sure EKG is done and shows up in the chart.  Will continue to monitor patient.  Randell Patient

## 2016-01-29 NOTE — Progress Notes (Signed)
Site area: Right groin a 5 french arterial sheath was removed  Site Prior to Removal:  Level 0  Pressure Applied For 15 MINUTES    Bedrest Beginning at 1055p  Manual:   Yes.    Patient Status During Pull:  stable  Post Pull Groin Site:  Level 0  Post Pull Instructions Given:  Yes.    Post Pull Pulses Present:  Yes.    Dressing Applied:  Yes.    Comments:  VS remain stable during sheath pull.

## 2016-01-30 ENCOUNTER — Encounter (HOSPITAL_COMMUNITY): Payer: Self-pay | Admitting: Cardiovascular Disease

## 2016-01-30 DIAGNOSIS — Z87891 Personal history of nicotine dependence: Secondary | ICD-10-CM | POA: Diagnosis not present

## 2016-01-30 DIAGNOSIS — R0789 Other chest pain: Secondary | ICD-10-CM | POA: Diagnosis not present

## 2016-01-30 DIAGNOSIS — I998 Other disorder of circulatory system: Secondary | ICD-10-CM

## 2016-01-30 DIAGNOSIS — Z794 Long term (current) use of insulin: Secondary | ICD-10-CM | POA: Diagnosis not present

## 2016-01-30 DIAGNOSIS — Z6823 Body mass index (BMI) 23.0-23.9, adult: Secondary | ICD-10-CM | POA: Diagnosis not present

## 2016-01-30 DIAGNOSIS — J45909 Unspecified asthma, uncomplicated: Secondary | ICD-10-CM | POA: Diagnosis not present

## 2016-01-30 DIAGNOSIS — I739 Peripheral vascular disease, unspecified: Secondary | ICD-10-CM | POA: Diagnosis not present

## 2016-01-30 DIAGNOSIS — I2510 Atherosclerotic heart disease of native coronary artery without angina pectoris: Secondary | ICD-10-CM | POA: Diagnosis not present

## 2016-01-30 DIAGNOSIS — E1140 Type 2 diabetes mellitus with diabetic neuropathy, unspecified: Secondary | ICD-10-CM | POA: Diagnosis not present

## 2016-01-30 DIAGNOSIS — I69354 Hemiplegia and hemiparesis following cerebral infarction affecting left non-dominant side: Secondary | ICD-10-CM | POA: Diagnosis not present

## 2016-01-30 DIAGNOSIS — I10 Essential (primary) hypertension: Secondary | ICD-10-CM | POA: Diagnosis not present

## 2016-01-30 DIAGNOSIS — E785 Hyperlipidemia, unspecified: Secondary | ICD-10-CM | POA: Diagnosis not present

## 2016-01-30 DIAGNOSIS — M1990 Unspecified osteoarthritis, unspecified site: Secondary | ICD-10-CM | POA: Diagnosis not present

## 2016-01-30 DIAGNOSIS — Z7982 Long term (current) use of aspirin: Secondary | ICD-10-CM | POA: Diagnosis not present

## 2016-01-30 DIAGNOSIS — Z8719 Personal history of other diseases of the digestive system: Secondary | ICD-10-CM | POA: Diagnosis not present

## 2016-01-30 DIAGNOSIS — G43909 Migraine, unspecified, not intractable, without status migrainosus: Secondary | ICD-10-CM | POA: Diagnosis not present

## 2016-01-30 DIAGNOSIS — E1151 Type 2 diabetes mellitus with diabetic peripheral angiopathy without gangrene: Secondary | ICD-10-CM | POA: Diagnosis not present

## 2016-01-30 DIAGNOSIS — I70213 Atherosclerosis of native arteries of extremities with intermittent claudication, bilateral legs: Secondary | ICD-10-CM | POA: Diagnosis not present

## 2016-01-30 DIAGNOSIS — Z7902 Long term (current) use of antithrombotics/antiplatelets: Secondary | ICD-10-CM | POA: Diagnosis not present

## 2016-01-30 LAB — GLUCOSE, CAPILLARY
Glucose-Capillary: 230 mg/dL — ABNORMAL HIGH (ref 65–99)
Glucose-Capillary: 256 mg/dL — ABNORMAL HIGH (ref 65–99)
Glucose-Capillary: 233 mg/dL — ABNORMAL HIGH (ref 65–99)
Glucose-Capillary: 186 mg/dL — ABNORMAL HIGH (ref 65–99)

## 2016-01-30 LAB — BASIC METABOLIC PANEL
Anion gap: 7 (ref 5–15)
BUN: 14 mg/dL (ref 6–20)
CO2: 25 mmol/L (ref 22–32)
Calcium: 9.9 mg/dL (ref 8.9–10.3)
Chloride: 108 mmol/L (ref 101–111)
Creatinine, Ser: 0.98 mg/dL (ref 0.44–1.00)
GFR calc Af Amer: >60 mL/min (ref >60)
GFR calc non Af Amer: >60 mL/min (ref >60)
Glucose, Bld: 243 mg/dL — ABNORMAL HIGH (ref 65–99)
Potassium: 3.7 mmol/L (ref 3.5–5.1)
Sodium: 140 mmol/L (ref 135–145)

## 2016-01-30 LAB — CBC
HCT: 32.9 % — ABNORMAL LOW (ref 36.0–46.0)
Hemoglobin: 11 g/dL — ABNORMAL LOW (ref 12.0–15.0)
MCH: 27.7 pg (ref 26.0–34.0)
MCHC: 33.4 g/dL (ref 30.0–36.0)
MCV: 82.9 fL (ref 78.0–100.0)
Platelets: 189 10*3/uL (ref 150–400)
RBC: 3.97 MIL/uL (ref 3.87–5.11)
RDW: 12.5 % (ref 11.5–15.5)
WBC: 5.5 10*3/uL (ref 4.0–10.5)

## 2016-01-30 LAB — TROPONIN I: Troponin I: <0.03 ng/mL (ref <0.03)

## 2016-01-30 MED ORDER — BISACODYL 5 MG PO TBEC
10.0000 mg | DELAYED_RELEASE_TABLET | Freq: Every day | ORAL | Status: DC | PRN
  Administered 2016-01-30: 10 mg via ORAL
  Filled 2016-01-30: qty 2

## 2016-01-30 NOTE — Progress Notes (Signed)
Unable to do discharge today as patient has no ride. Will discharge tomorrow.

## 2016-01-30 NOTE — Progress Notes (Signed)
Patient Name: [name] Date of Encounter: 01/30/2016   SUBJECTIVE  Had cp last night that resolved with SL nitro x 2. No further episode. No SOB. Complains of bilateral leg weakness and tingling.   CURRENT MEDS . amLODipine  5 mg Oral Daily  . aspirin EC  81 mg Oral Daily  . azelastine  2 spray Each Nare BID  . B-complex with vitamin C  1 tablet Oral Daily  . citalopram  10 mg Oral Daily  . clopidogrel  75 mg Oral Daily  . doxycycline  100 mg Oral Daily  . hydrochlorothiazide  12.5 mg Oral Daily  . insulin aspart  5-12 Units Subcutaneous TID AC  . insulin glargine  30 Units Subcutaneous q morning - 10a  . irbesartan  150 mg Oral Daily  . rosuvastatin  40 mg Oral q1800  . SUMAtriptan  50 mg Oral Once    OBJECTIVE  Vitals:   01/29/16 1534 01/29/16 2000 01/29/16 2100 01/30/16 0447  BP: (!) 166/82 (!) 175/81 132/73 (!) 132/54  Pulse: 83 (!) 50 (!) 106 (!) 49  Resp: 18 18  16   Temp: 98.1 F (36.7 C) 97.8 F (36.6 C)  98.6 F (37 C)  TempSrc:  Oral  Oral  SpO2: 99% 100%  99%  Weight:      Height:        Intake/Output Summary (Last 24 hours) at 01/30/16 1109 Last data filed at 01/30/16 0800  Gross per 24 hour  Intake              240 ml  Output              900 ml  Net             -660 ml   Filed Weights   01/29/16 0728  Weight: 136 lb (61.7 kg)    PHYSICAL EXAM  General: Pleasant, NAD. Neuro: Alert and oriented X 3. Moves all extremities spontaneously. Psych: Normal affect. HEENT:  Normal  Neck: Supple without bruits or JVD. Lungs:  Resp regular and unlabored, CTA. Heart: Irregular no s3, s4, or murmurs. Abdomen: Soft, non-tender, non-distended, BS + x 4.  Extremities: No clubbing, cyanosis or edema. DP/PT/Radials 2+ and equal bilaterally. R groin without hematoma.   Accessory Clinical Findings  CBC  Recent Labs  01/30/16 0300  WBC 5.5  HGB 11.0*  HCT 32.9*  MCV 82.9  PLT 99991111   Basic Metabolic Panel  Recent Labs  01/30/16 0300  NA  140  K 3.7  CL 108  CO2 25  GLUCOSE 243*  BUN 14  CREATININE 0.98  CALCIUM 9.9    TELE  Sinus rhythm with PACs and PVCs   PV Angiogram/Intervention 01/29/16  Angiographic Data:   1: Abdominal aortogram-distal abdominal aorta was free of significant disease 2: Left lower extremity-there was a 60% segmental proximal left SFA stenosis followed by occlusion of the previously placed Viabahn  stent graft with reconstitution in the adductor canal by profunda femoris collaterals. There is two-vessel runoff and occluded peroneal artery. 3: Right lower extremity-there was 30% segmental proximal right common iliac artery stenosis without a pullback gradient. There was 40% segmental mid right SFA with two-vessel runoff. The peroneal is occluded in its midportion.  IMPRESSION: Mrs. Lamotte unfortunately has had occlusion of her previously placed Viabahn  stent graft. I suspect there is subacute thrombus within the stent graft making percutaneous revascularization somewhat high risk. I believe the best form of therapy at  this point would be some bypass grafting for lifestyle limiting claudication. The sheath was removed and pressure held on the groin to achieve hemostasis. The patient left the lab in stable condition. We'll keep her overnight because of lack of family support at home and she'll be discharged home in the morning. She'll see me back in the office in 2-3 weeks for follow-up.   Radiology/Studies  Dg Chest 2 View  Result Date: 01/18/2016 CLINICAL DATA:  Preoperative exam prior to procedure for claudication. No current chest complaints. Discontinued smoking 6 months ago. History of asthma and hyperlipidemia. EXAM: CHEST  2 VIEW COMPARISON:  PA and lateral chest x-ray of November 02, 2014 FINDINGS: The lungs are adequately inflated. The interstitial markings are mildly prominent though stable. There is no alveolar infiltrate or pleural effusion. The heart and pulmonary vascularity are  normal. There is no pleural effusion. The bony thorax exhibits no acute abnormality. There are calcifications within the breast parenchyma on the left consistent with fat necrosis related to previous breast reduction surgery. IMPRESSION: Mild chronic bronchitic changes, stable. No pneumonia, CHF, nor other acute cardiopulmonary abnormality. Electronically Signed   By: David  [name] M.D.   On: 01/18/2016 16:29    ASSESSMENT AND PLAN  1. Critical lower limb ischemia/ Claudication (HCC) - PV angiogram as above. Occlusion of her previously placed Viabahn  stent graft.  High risk for revascularization. Plan to f/u with Dr. Gwenlyn Found in 2-3 weeks for possible bypass grafting.  - Continue ASA and plavix.   2. Burning/tingling sensation in legs bilaterally - could be due to PAD vs diabetic neuropathy. Previously on Neurontin however did not tolerated.   3. Chest pain - Resolved with SL nitro x 2 overnight. Myoview stress test performed in March of this year was nonischemic. EKG without acute changes. Telemetry showed sinus rhythm with PACs.  Jarrett Soho PA-C Pager [phone]

## 2016-01-31 ENCOUNTER — Encounter (HOSPITAL_COMMUNITY): Payer: Self-pay | Admitting: Physician Assistant

## 2016-01-31 DIAGNOSIS — I69354 Hemiplegia and hemiparesis following cerebral infarction affecting left non-dominant side: Secondary | ICD-10-CM | POA: Diagnosis not present

## 2016-01-31 DIAGNOSIS — Z7982 Long term (current) use of aspirin: Secondary | ICD-10-CM | POA: Diagnosis not present

## 2016-01-31 DIAGNOSIS — E669 Obesity, unspecified: Secondary | ICD-10-CM

## 2016-01-31 DIAGNOSIS — I2510 Atherosclerotic heart disease of native coronary artery without angina pectoris: Secondary | ICD-10-CM | POA: Diagnosis not present

## 2016-01-31 DIAGNOSIS — E1140 Type 2 diabetes mellitus with diabetic neuropathy, unspecified: Secondary | ICD-10-CM | POA: Diagnosis not present

## 2016-01-31 DIAGNOSIS — Z87891 Personal history of nicotine dependence: Secondary | ICD-10-CM | POA: Diagnosis not present

## 2016-01-31 DIAGNOSIS — Z794 Long term (current) use of insulin: Secondary | ICD-10-CM | POA: Diagnosis not present

## 2016-01-31 DIAGNOSIS — M1990 Unspecified osteoarthritis, unspecified site: Secondary | ICD-10-CM | POA: Diagnosis not present

## 2016-01-31 DIAGNOSIS — J45909 Unspecified asthma, uncomplicated: Secondary | ICD-10-CM | POA: Diagnosis not present

## 2016-01-31 DIAGNOSIS — I70213 Atherosclerosis of native arteries of extremities with intermittent claudication, bilateral legs: Secondary | ICD-10-CM | POA: Diagnosis not present

## 2016-01-31 DIAGNOSIS — E785 Hyperlipidemia, unspecified: Secondary | ICD-10-CM | POA: Diagnosis not present

## 2016-01-31 DIAGNOSIS — Z8719 Personal history of other diseases of the digestive system: Secondary | ICD-10-CM | POA: Diagnosis not present

## 2016-01-31 DIAGNOSIS — Z6823 Body mass index (BMI) 23.0-23.9, adult: Secondary | ICD-10-CM | POA: Diagnosis not present

## 2016-01-31 DIAGNOSIS — I739 Peripheral vascular disease, unspecified: Secondary | ICD-10-CM | POA: Diagnosis not present

## 2016-01-31 DIAGNOSIS — I10 Essential (primary) hypertension: Secondary | ICD-10-CM | POA: Diagnosis not present

## 2016-01-31 DIAGNOSIS — Z7902 Long term (current) use of antithrombotics/antiplatelets: Secondary | ICD-10-CM | POA: Diagnosis not present

## 2016-01-31 DIAGNOSIS — G43909 Migraine, unspecified, not intractable, without status migrainosus: Secondary | ICD-10-CM | POA: Diagnosis not present

## 2016-01-31 DIAGNOSIS — R0789 Other chest pain: Secondary | ICD-10-CM | POA: Diagnosis not present

## 2016-01-31 DIAGNOSIS — E1151 Type 2 diabetes mellitus with diabetic peripheral angiopathy without gangrene: Secondary | ICD-10-CM | POA: Diagnosis not present

## 2016-01-31 LAB — GLUCOSE, CAPILLARY
Glucose-Capillary: 126 mg/dL — ABNORMAL HIGH (ref 65–99)
Glucose-Capillary: 199 mg/dL — ABNORMAL HIGH (ref 65–99)

## 2016-01-31 NOTE — Discharge Instructions (Signed)
Groin Surgical Site Care Refer to this sheet in the next few weeks. These instructions provide you with information about caring for yourself after your procedure. Your health care provider may also give you more specific instructions. Your treatment has been planned according to current medical practices, but problems sometimes occur. Call your health care provider if you have any problems or questions after your procedure. WHAT TO EXPECT AFTER THE PROCEDURE After your procedure, it is typical to have the following:  Bruising at the groin site that usually fades within 1-2 weeks.  Blood collecting in the tissue (hematoma) that may be painful to the touch. It should usually decrease in size and tenderness within 1-2 weeks. HOME CARE INSTRUCTIONS  Take medicines only as directed by your health care provider.  You may shower 24-48 hours after the procedure or as directed by your health care provider. Remove the bandage (dressing) and gently wash the site with plain soap and water. Pat the area dry with a clean towel. Do not rub the site, because this may cause bleeding.  Do not take baths, swim, or use a hot tub until your health care provider approves.  Check your insertion site every day for redness, swelling, or drainage.  Do not apply powder or lotion to the site.  Limit use of stairs to twice a day for the first 2-3 days or as directed by your health care provider.  Do not squat for the first 2-3 days or as directed by your health care provider.  Do not lift over 10 lb (4.5 kg) for 5 days after your procedure or as directed by your health care provider.  Ask your health care provider when it is okay to:  Return to work or school.  Resume usual physical activities or sports.  Resume sexual activity.  Do not drive home if you are discharged the same day as the procedure. Have someone else drive you.  You may drive 24 hours after the procedure unless otherwise instructed by your  health care provider.  Do not operate machinery or power tools for 24 hours after the procedure or as directed by your health care provider.  If your procedure was done as an outpatient procedure, which means that you went home the same day as your procedure, a responsible adult should be with you for the first 24 hours after you arrive home.  Keep all follow-up visits as directed by your health care provider. This is important. SEEK MEDICAL CARE IF:  You have a fever.  You have chills.  You have increased bleeding from the groin site. Hold pressure on the site. SEEK IMMEDIATE MEDICAL CARE IF:  You have unusual pain at the groin site.  You have redness, warmth, or swelling at the groin site.  You have drainage (other than a small amount of blood on the dressing) from the groin site.  The groin site is bleeding, and the bleeding does not stop after 30 minutes of holding steady pressure on the site.  Your leg or foot becomes pale, cool, tingly, or numb.   This information is not intended to replace advice given to you by your health care provider. Make sure you discuss any questions you have with your health care provider.   Document Released: 12/24/2013 Document Reviewed: 12/24/2013 Elsevier Interactive Patient Education 2016 [employer]  Neuropathic Pain Neuropathic pain is pain caused by damage to the nerves that are responsible for certain sensations in your body (sensory nerves). The pain can  be caused by damage to:   The sensory nerves that send signals to your spinal cord and brain (peripheral nervous system).  The sensory nerves in your brain or spinal cord (central nervous system). Neuropathic pain can make you more sensitive to pain. What would be a minor sensation for most people may feel very painful if you have neuropathic pain. This is usually a long-term condition that can be difficult to treat. The type of pain can differ from person to person. It may start  suddenly (acute), or it may develop slowly and last for a long time (chronic). Neuropathic pain may come and go as damaged nerves heal or may stay at the same level for years. It often causes emotional distress, loss of sleep, and a lower quality of life. CAUSES  The most common cause of damage to a sensory nerve is diabetes. Many other diseases and conditions can also cause neuropathic pain. Causes of neuropathic pain can be classified as:  Toxic. Many drugs and chemicals can cause toxic damage. The most common cause of toxic neuropathic pain is damage from drug treatment for cancer (chemotherapy).  Metabolic. This type of pain can happen when a disease causes imbalances that damage nerves. Diabetes is the most common of these diseases. Vitamin B deficiency caused by long-term alcohol abuse is another common cause.  Traumatic. Any injury that cuts, crushes, or stretches a nerve can cause damage and pain. A common example is feeling pain after losing an arm or leg (phantom limb pain).  Compression-related. If a sensory nerve gets trapped or compressed for a long period of time, the blood supply to the nerve can be cut off.  Vascular. Many blood vessel diseases can cause neuropathic pain by decreasing blood supply and oxygen to nerves.  Autoimmune. This type of pain results from diseases in which the body's defense system mistakenly attacks sensory nerves. Examples of autoimmune diseases that can cause neuropathic pain include lupus and multiple sclerosis.  Infectious. Many types of viral infections can damage sensory nerves and cause pain. Shingles infection is a common cause of this type of pain.  Inherited. Neuropathic pain can be a symptom of many diseases that are passed down through families (genetic). SIGNS AND SYMPTOMS  The main symptom is pain. Neuropathic pain is often described as:  Burning.  Shock-like.  Stinging.  Hot or cold.  Itching. DIAGNOSIS  No single test can  diagnose neuropathic pain. Your health care provider will do a physical exam and ask you about your pain. You may use a pain scale to describe how bad your pain is. You may also have tests to see if you have a high sensitivity to pain and to help find the cause and location of any sensory nerve damage. These tests may include:  Imaging studies, such as:  X-rays.  CT scan.  MRI.  Nerve conduction studies to test how well nerve signals travel through your sensory nerves (electrodiagnostic testing).  Stimulating your sensory nerves through electrodes on your skin and measuring the response in your spinal cord and brain (somatosensory evoked potentials). TREATMENT  Treatment for neuropathic pain may change over time. You may need to try different treatment options or a combination of treatments. Some options include:  Over-the-counter pain relievers.  Prescription medicines. Some medicines used to treat other conditions may also help neuropathic pain. These include medicines to:  Control seizures (anticonvulsants).  Relieve depression (antidepressants).  Prescription-strength pain relievers (narcotics). These are usually used when other pain relievers do  not help.  Transcutaneous nerve stimulation (TENS). This uses electrical currents to block painful nerve signals. The treatment is painless.  Topical and local anesthetics. These are medicines that numb the nerves. They can be injected as a nerve block or applied to the skin.  Alternative treatments, such as:  Acupuncture.  Meditation.  Massage.  Physical therapy.  Pain management programs.  Counseling. HOME CARE INSTRUCTIONS  Learn as much as you can about your condition.  Take medicines only as directed by your health care provider.  Work closely with all your health care providers to find what works best for you.  Have a good support system at home.  Consider joining a chronic pain support group. SEEK MEDICAL CARE  IF:  Your pain treatments are not helping.  You are having side effects from your medicines.  You are struggling with fatigue, mood changes, depression, or anxiety.   This information is not intended to replace advice given to you by your health care provider. Make sure you discuss any questions you have with your health care provider.   Document Released: 01/18/2004 Document Revised: 05/13/2014 Document Reviewed: 09/30/2013 Elsevier Interactive Patient Education 2016 [employer]   Peripheral Vascular Disease Peripheral vascular disease (PVD) is a disease of the blood vessels that are not part of your heart and brain. A simple term for PVD is poor circulation. In most cases, PVD narrows the blood vessels that carry blood from your heart to the rest of your body. This can result in a decreased supply of blood to your arms, legs, and internal organs, like your stomach or kidneys. However, it most often affects a person's lower legs and feet. There are two types of PVD.  Organic PVD. This is the more common type. It is caused by damage to the structure of blood vessels.  Functional PVD. This is caused by conditions that make blood vessels contract and tighten (spasm). Without treatment, PVD tends to get worse over time. PVD can also lead to acute ischemic limb. This is when an arm or limb suddenly has trouble getting enough blood. This is a medical emergency.  HOME CARE  Take medicines only as told by your doctor.  Do not use any tobacco products, including cigarettes, chewing tobacco, or electronic cigarettes. If you need help quitting, ask your doctor.  Lose weight if you are overweight, and maintain a healthy weight as told by your doctor.  Eat a diet that is low in fat and cholesterol. If you need help, ask your doctor.  Exercise regularly. Ask your doctor for some good activities for you.  Take good care of your feet.  Wear comfortable shoes that fit well.  Check your feet  often for any cuts or sores. GET HELP IF:  You have cramps in your legs while walking.  You have leg pain when you are at rest.  You have coldness in a leg or foot.  Your skin changes.  You are unable to get or have an erection (erectile dysfunction).  You have cuts or sores on your feet that are not healing. GET HELP RIGHT AWAY IF:  Your arm or leg turns cold and blue.  Your arms or legs become red, warm, swollen, painful, or numb.  You have chest pain or trouble breathing.  You suddenly have weakness in your face, arm, or leg.  You become very confused or you cannot speak.  You suddenly have a very bad headache.  You suddenly cannot see.   This  information is not intended to replace advice given to you by your health care provider. Make sure you discuss any questions you have with your health care provider.   Document Released: 07/17/2009 Document Revised: 05/13/2014 Document Reviewed: 09/30/2013 Elsevier Interactive Patient Education [employer].

## 2016-01-31 NOTE — Progress Notes (Signed)
Patient Name: [name] Date of Encounter: 01/31/2016  Hospital Problem List     Active Problems:   DM2 (diabetes mellitus, type 2) ([place])   Hyperlipidemia LDL goal <70   Essential hypertension   Chest pain at rest   Peripheral arterial disease [facility])   Critical lower limb ischemia   Claudication ([facility])   Diabetic neuropathy ([facility])   Obesity   Mild CAD    Subjective   Feeling much better today. No CP or SOB reported. Eager to go home.  Inpatient Medications    . amLODipine  5 mg Oral Daily  . aspirin EC  81 mg Oral Daily  . azelastine  2 spray Each Nare BID  . B-complex with vitamin C  1 tablet Oral Daily  . citalopram  10 mg Oral Daily  . clopidogrel  75 mg Oral Daily  . doxycycline  100 mg Oral Daily  . hydrochlorothiazide  12.5 mg Oral Daily  . insulin aspart  5-12 Units Subcutaneous TID AC  . insulin glargine  30 Units Subcutaneous q morning - 10a  . irbesartan  150 mg Oral Daily  . rosuvastatin  40 mg Oral q1800  . SUMAtriptan  50 mg Oral Once    Vital Signs    Vitals:   01/30/16 1338 01/30/16 2100 01/31/16 0525 01/31/16 0938  BP: (!) 136/59 140/66 118/63 130/66  Pulse: (!) 52 (!) 45 61 81  Resp: 18 18 17    Temp: 98 F (36.7 C) 97.9 F (36.6 C) 97.3 F (36.3 C)   TempSrc: Oral Oral Oral   SpO2: 100% 100% 99% 98%  Weight:      Height:        Intake/Output Summary (Last 24 hours) at 01/31/16 1045 Last data filed at 01/31/16 0525  Gross per 24 hour  Intake           1812.5 ml  Output                0 ml  Net           1812.5 ml   Filed Weights   01/29/16 0728  Weight: 136 lb (61.7 kg)    Physical Exam    General: Well developed, well nourished AAF, in no acute distress. HEENT: Normocephalic, atraumatic, sclera non-icteric, no xanthomas, nares are without discharge. Neck: Negative for carotid bruits. JVP not elevated. Lungs: Clear bilaterally to auscultation without wheezes, rales, or rhonchi. Breathing is unlabored. Cardiac: RRR S1 S2  without murmurs, rubs, or gallops.  Abdomen: Soft, non-tender, non-distended with normoactive bowel sounds. No rebound/guarding. Extremities: No clubbing or cyanosis. No edema. Distal pedal pulses are 2+ and equal bilaterally. Right groin cath site without hematoma, ecchymosis, or bruit. Skin: Warm and dry, no significant rash. Neuro: Alert and oriented X 3. Strength and sensation in tact. Psych:  Responds to questions appropriately with a normal affect.  Labs    CBC  Recent Labs  01/30/16 0300  WBC 5.5  HGB 11.0*  HCT 32.9*  MCV 82.9  PLT 99991111   Basic Metabolic Panel  Recent Labs  01/30/16 0300  NA 140  K 3.7  CL 108  CO2 25  GLUCOSE 243*  BUN 14  CREATININE 0.98  CALCIUM 9.9   Cardiac Enzymes  Recent Labs  01/30/16 1337  TROPONINI <0.03     Telemetry    N/A  Radiology    Dg Chest 2 View  Result Date: 01/18/2016 CLINICAL DATA:  Preoperative exam prior to  procedure for claudication. No current chest complaints. Discontinued smoking 6 months ago. History of asthma and hyperlipidemia. EXAM: CHEST  2 VIEW COMPARISON:  PA and lateral chest x-ray of November 02, 2014 FINDINGS: The lungs are adequately inflated. The interstitial markings are mildly prominent though stable. There is no alveolar infiltrate or pleural effusion. The heart and pulmonary vascularity are normal. There is no pleural effusion. The bony thorax exhibits no acute abnormality. There are calcifications within the breast parenchyma on the left consistent with fat necrosis related to previous breast reduction surgery. IMPRESSION: Mild chronic bronchitic changes, stable. No pneumonia, CHF, nor other acute cardiopulmonary abnormality. Electronically Signed   By: David  [name] M.D.   On: 01/18/2016 16:29     Patient Profile     20F with strokes with mild left sided residual weakness, tobacco abuse, DM c/b neuropathy, HTN, HLD, PVD, atypical CP (neg nuc 07/2015), PVD s/p PTA/stenting of left SFA 10/2014,  obesity, ovarian cancer s/p surgery remotely, remote stomach ulcer, asthma, migraines, arthritis, minimal CAD by cath 09/2010 who presented for PV angiogram due to worsening claudication and reduction in ABIs. LE angiogram 01/29/16 showed 60% segmental prox LSFA followed by occlusion of prior stent with reconstitution in the adductor canal by profunda femoris collaterals. On the right there was 30% segmental proximal right common iliac artery stenosis, 40% segmental mid right SFA with two-vessel runoff, peroneal is occluded in its midportion.   Assessment & Plan    1. PVD - per Dr. Gwenlyn Found, he believes the best form of therapy at this point would be some bypass grafting for lifestyle limiting claudication. I will clarify plan for f/u with Dr Debara Pickett regarding f/u with Dr. Gwenlyn Found or concomitant vascular surgery consult to get the ball rolling.  2. HTN - controlled.  3. Chest pain - h/o atypical chest pain. Neg nuc in 07/2015. Will continue to follow and have pt f/u with Dr. Gwenlyn Found to determine if further pre-op testing is needed prior to LE bypass.  Signed, Charlie Pitter, PA-C  01/31/2016, 10:45 AM

## 2016-01-31 NOTE — Discharge Summary (Signed)
Discharge Summary    Patient ID: [name],  MRN: [id number], DOB/AGE: 04-Sep-1955 60 y.o.  Admit date: 01/29/2016 Discharge date: 01/31/2016  Primary Care Provider: Wende Neighbors Primary Cardiologist: Gwenlyn Found   Discharge Diagnoses    Principal Problem:   Claudication [facility]) Active Problems:   Peripheral arterial disease ([place])   DM2 (diabetes mellitus, type 2) ([place])   Hyperlipidemia LDL goal <70   Essential hypertension   Chest pain at rest   Diabetic neuropathy ([place])   Obesity   Mild CAD    Diagnostic Studies/Procedures    1. 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 LE angiogram 01/29/16 showed 60% segmental prox LSFA followed by occlusion of prior stent with reconstitution in the adductor canal by profunda femoris collaterals. On the right there was 30% segmental proximal right common iliac artery stenosis, 40% segmental mid right SFA with two-vessel runoff, peroneal is occluded in its midportion. Per Dr. Gwenlyn Found, he believes the best form of therapy at this point would be some bypass grafting for lifestyle limiting claudication. He would like to further review with the patient at follow-up in 2-3 weeks. Post-procedure the patient reported she did not have a ride and had some chest pain so she was observed overnight without acute complication. Troponin was negative and EKG was nonacute. Her chest pain resolved. This morning she feels well without complaint except chronic  neuropathy of the bottom of her feet. She has not had any ulcers. She follows with primary care for this (prior intolerance to Lyrica). She plans to further discuss with them. Dr. Debara Pickett has seen and examined the patient today and feels he is stable for discharge. Will defer decision of further definitive eval of her chest pain to Dr. Gwenlyn Found when he sees her in follow-up. Vin Bhagat, PA-C originally did her DC med rec last night with the intention of her going home and scheduled her f/u appt. She stayed until this morning but there have been no interim med changes since the med rec was performed.  Consultants: N/A _____________  Discharge Vitals Blood pressure 130/66, pulse 81, temperature 97.3 F (36.3 C), temperature source Oral, resp. rate 17, height 5\' 4"  (1.626 m), weight 136 lb (61.7 kg), SpO2 98 %.  Filed Weights   01/29/16 0728  Weight: 136 lb (61.7 kg)    Labs & Radiologic Studies    CBC  Recent Labs  01/30/16 0300  WBC 5.5  HGB 11.0*  HCT 32.9*  MCV 82.9  PLT 99991111   Basic Metabolic Panel  Recent Labs  01/30/16 0300  NA 140  K 3.7  CL 108  CO2 25  GLUCOSE 243*  BUN 14  CREATININE 0.98  CALCIUM 9.9   Cardiac Enzymes  Recent Labs  01/30/16 1337  TROPONINI <0.03   _____________  Dg Chest 2 View  Result Date: 01/18/2016 CLINICAL DATA:  Preoperative exam prior to procedure for claudication. No current chest complaints. Discontinued smoking 6 months ago. History of asthma and hyperlipidemia. EXAM: CHEST  2 VIEW COMPARISON:  PA and lateral chest x-ray of November 02, 2014 FINDINGS: The lungs are adequately inflated. The interstitial markings are mildly prominent though stable. There is no alveolar infiltrate or pleural effusion. The heart and pulmonary vascularity are normal. There is no pleural effusion. The bony thorax exhibits no acute abnormality. There are calcifications within the breast parenchyma on the left consistent with fat necrosis related to previous breast  reduction surgery. IMPRESSION: Mild chronic bronchitic changes, stable. No pneumonia, CHF, nor other acute cardiopulmonary abnormality. Electronically Signed   By: David  [name] M.D.   On: 01/18/2016 16:29   Disposition   Pt is being discharged home today in good condition.  Follow-up Plans & Appointments    Follow-up Information    Quay Burow, MD Follow up on 02/16/2016.   Specialties:  Cardiology, Radiology Why:  @9 :30 post PV procedure Contact information: [address] [place] [address] [phone]        Wende Neighbors, MD Follow up in 5 day(s).   Specialty:  Internal Medicine Why:  for non cardiac leg pain evalution  Contact information: [place] [address] [phone]          Discharge Instructions    Diet - low sodium heart healthy    Complete by:  As directed    Diet - low sodium heart healthy    Complete by:  As directed    Increase activity slowly    Complete by:  As directed    Increase activity slowly    Complete by:  As directed       Discharge Medications     Medication List    TAKE these medications   acetaminophen 325 MG tablet Commonly known as:  TYLENOL Take 2 tablets (650 mg total) by mouth every 4 (four) hours as needed for headache or mild pain.   albuterol 108 (90 Base) MCG/ACT inhaler Commonly known as:  PROAIR HFA 1-2 inhalations every 4-6 hours as needed for cough or wheeze.   ALPRAZolam 0.5 MG tablet Commonly known as:  XANAX Take 0.25-0.5 mg by mouth at bedtime as needed for anxiety or sleep.   aspirin EC 81 MG tablet Take 81 mg by mouth daily. What changed:  Another medication with the same name was removed. Continue taking this medication, and follow the directions you see here.   Azelastine HCl 0.15 % Soln Place 2 sprays into both nostrils 2 (two) times daily.   b complex vitamins tablet Take 1 tablet by mouth daily.   CALCIUM 600 + D PO Take 1 tablet by mouth daily.     CELEXA 10 MG tablet Generic drug:  citalopram Take 10 mg by mouth daily.   clopidogrel 75 MG tablet Commonly known as:  PLAVIX Take 75 mg by mouth at bedtime.   dicyclomine 20 MG tablet Commonly known as:  BENTYL Take 1 tablet (20 mg total) by mouth 3 (three) times daily before meals.   doxycycline 100 MG EC tablet Commonly known as:  DORYX Take 100 mg by mouth daily.   EPINEPHrine 0.3 mg/0.3 mL Soaj injection Commonly known as:  EPI-PEN Inject 0.3 mLs (0.3 mg total) into the muscle once.   Fenofibric Acid 105 MG Tabs Take by mouth.   ibuprofen 800 MG tablet Commonly known as:  ADVIL,MOTRIN Take 800 mg by mouth every 8 (eight) hours as needed.   insulin aspart 100 UNIT/ML injection Commonly known as:  novoLOG Inject 5-12 Units into the  skin 3 (three) times daily before meals. Per sliding scale Patient not currently using Novolog using Humalog.  Patient switches back and forth.   insulin lispro 100 UNIT/ML injection Commonly known as:  HUMALOG Inject 5-12 Units into the skin as directed. 5 units before lunch and dinner. > 200 8 units, > 250 10 units, > 300 12 units   loratadine 10 MG tablet Commonly known as:  CLARITIN Take 10 mg by mouth daily as needed for allergies.   meclizine 25 MG tablet Commonly known as:  ANTIVERT Take 25 mg by mouth 3 (three) times daily as needed for dizziness.   mometasone 50 MCG/ACT nasal spray Commonly known as:  NASONEX 1-2 sprays in each nostril once daily for stuffy nose or drainage. What changed:  how much to take  how to take this  when to take this  reasons to take this  additional instructions   mupirocin cream 2 % Commonly known as:  BACTROBAN Apply 1 application topically 2 (two) times daily as needed (wound care (boils)).   nitroGLYCERIN 0.4 mg/hr patch Commonly known as:  NITRODUR - Dosed in mg/24 hr Place 0.4 mg onto the skin as needed (chest pain). Reported on 07/10/2015   nitroGLYCERIN 0.4 MG SL  tablet Commonly known as:  NITROSTAT Place 1 tablet (0.4 mg total) under the tongue every 5 (five) minutes as needed for chest pain. Maximum 3 doses   ONE TOUCH ULTRA TEST test strip Generic drug:  glucose blood   pantoprazole 40 MG tablet Commonly known as:  PROTONIX TAKE 1 TABLET (40 MG TOTAL) BY MOUTH DAILY.   potassium chloride SA 20 MEQ tablet Commonly known as:  K-DUR,KLOR-CON Take 20 mEq by mouth daily as needed (for cramps).   ranitidine 150 MG tablet Commonly known as:  ZANTAC Use one tablet one to two times daily as directed. What changed:  how much to take  how to take this  when to take this  reasons to take this  additional instructions   rosuvastatin 40 MG tablet Commonly known as:  CRESTOR Take 40 mg by mouth daily.   SUMAtriptan 50 MG tablet Commonly known as:  IMITREX Take 50 mg by mouth every 2 (two) hours as needed for migraine.   TOUJEO SOLOSTAR 300 UNIT/ML Sopn Generic drug:  Insulin Glargine Inject 30 Units into the skin every morning.   TRIBENZOR 20-5-12.5 MG Tabs Generic drug:  Olmesartan-Amlodipine-HCTZ Take 1 tablet by mouth daily.   TRULICITY A999333 0000000 Sopn Generic drug:  Dulaglutide Inject 0.75 mg into the skin every 7 (seven) days. On Thursdays   zolpidem 10 MG tablet Commonly known as:  AMBIEN Take 5 mg by mouth at bedtime as needed for sleep.        Allergies:  Allergies  Allergen Reactions  . Bactrim [Sulfamethoxazole-Trimethoprim] Shortness Of Breath  . Sulfa Antibiotics Shortness Of Breath and Palpitations  . 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 Fish Allergy Hives and Swelling    Tongue swelling  . Iodine Swelling  . Metformin And Related Diarrhea  . Adhesive [Tape] Rash    Paper tape is ok  . Shellfish Allergy Swelling and Rash    Tongue swelling      Outstanding Labs/Studies   N/a   Duration of Discharge Encounter   Greater than 30 minutes including  physician time.  Signed, Nedra Hai Roald Lukacs PA-C 01/31/2016, 1:00 PM

## 2016-02-02 DIAGNOSIS — G589 Mononeuropathy, unspecified: Secondary | ICD-10-CM | POA: Diagnosis not present

## 2016-02-07 DIAGNOSIS — S90122A Contusion of left lesser toe(s) without damage to nail, initial encounter: Secondary | ICD-10-CM | POA: Diagnosis not present

## 2016-02-07 DIAGNOSIS — K122 Cellulitis and abscess of mouth: Secondary | ICD-10-CM | POA: Diagnosis not present

## 2016-02-09 ENCOUNTER — Telehealth: Payer: Self-pay | Admitting: Cardiovascular Disease

## 2016-02-09 DIAGNOSIS — Z6824 Body mass index (BMI) 24.0-24.9, adult: Secondary | ICD-10-CM | POA: Diagnosis not present

## 2016-02-09 DIAGNOSIS — K122 Cellulitis and abscess of mouth: Secondary | ICD-10-CM | POA: Diagnosis not present

## 2016-02-09 NOTE — Telephone Encounter (Signed)
Closed encounter °

## 2016-02-12 DIAGNOSIS — K122 Cellulitis and abscess of mouth: Secondary | ICD-10-CM | POA: Diagnosis not present

## 2016-02-16 ENCOUNTER — Ambulatory Visit: Payer: Medicare Other | Admitting: Cardiovascular Disease

## 2016-02-16 ENCOUNTER — Telehealth: Payer: Self-pay | Admitting: Cardiovascular Disease

## 2016-02-16 NOTE — Telephone Encounter (Signed)
Closed encounter °

## 2016-02-20 ENCOUNTER — Encounter: Payer: Self-pay | Admitting: Cardiovascular Disease

## 2016-02-20 ENCOUNTER — Ambulatory Visit (INDEPENDENT_AMBULATORY_CARE_PROVIDER_SITE_OTHER): Payer: Medicare Other | Admitting: Cardiovascular Disease

## 2016-02-20 VITALS — BP 137/90 | HR 101 | Ht 64.0 in | Wt 143.4 lb

## 2016-02-20 DIAGNOSIS — I739 Peripheral vascular disease, unspecified: Secondary | ICD-10-CM | POA: Diagnosis not present

## 2016-02-20 NOTE — Assessment & Plan Note (Signed)
Mrs. Pecot returns today for follow-up of her recent lower extremity angiogram which I performed 01/29/16. She had prior intervention by myself 11/03/14 with recanalization of the left SFA CT using a Viabahn  covered stent. ABI normalize at that time that her symptoms. Because of recurrent pain and worsening Doppler studies are restudied her revealing an occluded Viabahn  covered stent. I do not think percutaneous events position was appropriate. She continues to complain of claudication in her left calf.. I believe she is a good candidate for femoropopliteal bypass grafting. She does have 2 vessel runoff with an occluded peroneal artery. I'm referring her to Dr. Trula Slade for surgical evaluation. She has stopped smoking since her procedure.

## 2016-02-20 NOTE — Progress Notes (Signed)
02/20/2016 [name]   1956-01-11  [id number]  Primary Physician Wende Neighbors, MD Primary Cardiologist: Lorretta Harp MD Renae Gloss  HPI:   [name] is a very pleasant 60 year old thin-appearing divorced African-American female mother of one, grandmother of one grandchild who is currently disabled because of a prior stroke. She was referred by Dr. Melony Overly, from [name] podiatry, for evaluation and treatment of critical limb ischemia. I last saw her in the office 12/27/15. Her cardiovascular risk factor profile is notable for a strong family history of heart disease with the father about a stent, mother who had bypass surgery and a sister who died at age 82 of a myocardial infarction. She has never had a heart attack but apparently has had a stroke in the past with some mild left-sided residua. She has a history of tobacco abuse having recently quit several weeks ago.. She has treated diabetes, hypertension and hyperlipidemia. She had the onset of left ear pain approximately 3 months ago with progression to critical limb ischemia in early June with ischemic appearing left fourth toe. Dopplers in our office performed yesterday revealed a left ABI 0.6 with an occluded left SFA and one-vessel runoff. She will need to be admitted for angiography and potential endovascular therapy for critical limb ischemia. Angiogram to her on 11/03/14 revealing occluded left SFA. I performed [name] one directional atherectomy, PTA and stenting using a Viabahn covered stent with an excellent angiographic and clinical result. Her pain has resolved. Her critical limb ischemia has resolved. Her Dopplers have normalized. Since I saw her approximately 6 weeks ago she's had 4 episodes of night nitroglycerin responsive chest pain. Recent Dopplers did show a decline in her left ABI from 1.1 6 months ago to .91 with a simultaneous increase in velocity in her mid left SFA. Dopplers performed 12/20/15 revealed a  further reduction in her left ABI down to 0.71 with a high-frequency signal in her mid left SFA and worsening symptoms of claudication. I angiogram to her 01/29/16 revealing an occluded Viabahn  Stent. I do not think percutaneously vascular patient was a viable option rather femoropopliteal bypass grafting would be a better alternative   Current Outpatient Prescriptions  Medication Sig Dispense Refill  . acetaminophen (TYLENOL) 325 MG tablet Take 2 tablets (650 mg total) by mouth every 4 (four) hours as needed for headache or mild pain.    Marland Kitchen albuterol (PROAIR HFA) 108 (90 Base) MCG/ACT inhaler 1-2 inhalations every 4-6 hours as needed for cough or wheeze. 1 Inhaler 1  . ALPRAZolam (XANAX) 0.5 MG tablet Take 0.25-0.5 mg by mouth at bedtime as needed for anxiety or sleep.     Marland Kitchen aspirin EC 81 MG tablet Take 81 mg by mouth daily.    . Azelastine HCl 0.15 % SOLN Place 2 sprays into both nostrils 2 (two) times daily. 30 mL 5  . b complex vitamins tablet Take 1 tablet by mouth daily.    . Calcium Carb-Cholecalciferol (CALCIUM 600 + D PO) Take 1 tablet by mouth daily.    . citalopram (CELEXA) 10 MG tablet Take 10 mg by mouth daily.    . clopidogrel (PLAVIX) 75 MG tablet Take 75 mg by mouth at bedtime.     . dicyclomine (BENTYL) 20 MG tablet Take 1 tablet (20 mg total) by mouth 3 (three) times daily before meals. 90 tablet 1  . doxycycline (DORYX) 100 MG EC tablet Take 100 mg by mouth daily.    Marland Kitchen  EPINEPHrine 0.3 mg/0.3 mL IJ SOAJ injection Inject 0.3 mLs (0.3 mg total) into the muscle once. 1 Device 1  . Fenofibric Acid 105 MG TABS Take by mouth.    Marland Kitchen ibuprofen (ADVIL,MOTRIN) 800 MG tablet Take 800 mg by mouth every 8 (eight) hours as needed.    . insulin aspart (NOVOLOG) 100 UNIT/ML injection Inject 5-12 Units into the skin 3 (three) times daily before meals. Per sliding scale Patient not currently using Novolog using Humalog.  Patient switches back and forth.    . Insulin Glargine (TOUJEO SOLOSTAR) 300  UNIT/ML SOPN Inject 30 Units into the skin every morning.     . insulin lispro (HUMALOG) 100 UNIT/ML injection Inject 5-12 Units into the skin as directed. 5 units before lunch and dinner. > 200 8 units, > 250 10 units, > 300 12 units     . loratadine (CLARITIN) 10 MG tablet Take 10 mg by mouth daily as needed for allergies.    . mometasone (NASONEX) 50 MCG/ACT nasal spray 1-2 sprays in each nostril once daily for stuffy nose or drainage. (Patient taking differently: Place 2 sprays into the nose daily as needed (nasal congestion). ) 17 g 5  . mupirocin cream (BACTROBAN) 2 % Apply 1 application topically 2 (two) times daily as needed (wound care (boils)).     . nitroGLYCERIN (NITRODUR - DOSED IN MG/24 HR) 0.4 mg/hr Place 0.4 mg onto the skin as needed (chest pain). Reported on 07/10/2015    . nitroGLYCERIN (NITROSTAT) 0.4 MG SL tablet Place 1 tablet (0.4 mg total) under the tongue every 5 (five) minutes as needed for chest pain. Maximum 3 doses 25 tablet 12  . Olmesartan-Amlodipine-HCTZ (TRIBENZOR) 20-5-12.5 MG TABS Take 1 tablet by mouth daily.    . ONE TOUCH ULTRA TEST test strip     . pantoprazole (PROTONIX) 40 MG tablet TAKE 1 TABLET (40 MG TOTAL) BY MOUTH DAILY. 90 tablet 2  . potassium chloride SA (K-DUR,KLOR-CON) 20 MEQ tablet Take 20 mEq by mouth daily as needed (for cramps).    . ranitidine (ZANTAC) 150 MG tablet Use one tablet one to two times daily as directed. (Patient taking differently: Take 150 mg by mouth 2 (two) times daily as needed for heartburn. Use one tablet one to two times daily as directed.) 60 tablet 5  . rosuvastatin (CRESTOR) 40 MG tablet Take 40 mg by mouth daily.    . SUMAtriptan (IMITREX) 50 MG tablet Take 50 mg by mouth every 2 (two) hours as needed for migraine.    . TRULICITY [id number] 0000000 SOPN Inject 0.75 mg into the skin every 7 (seven) days. On Thursdays    . zolpidem (AMBIEN) 10 MG tablet Take 5 mg by mouth at bedtime as needed for sleep.    . meclizine (ANTIVERT)  25 MG tablet Take 25 mg by mouth 3 (three) times daily as needed for dizziness.     No current facility-administered medications for this visit.     Allergies  Allergen Reactions  . Bactrim [Sulfamethoxazole-Trimethoprim] Shortness Of Breath  . Sulfa Antibiotics Shortness Of Breath and Palpitations  . 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 Fish Allergy Hives and Swelling    Tongue swelling  . Iodine Swelling  . Metformin And Related Diarrhea  . Adhesive [Tape] Rash    Paper tape is ok  . Shellfish Allergy Swelling and Rash    Tongue swelling    Social History  Social History  . Marital status: Divorced    Spouse name: N/A  . Number of children: 1  . Years of education: N/A   Occupational History  .  Unemployed   Social History Main Topics  . Smoking status: Former Smoker    Packs/day: 0.33    Years: 41.00    Types: Cigarettes    Quit date: 01/15/2016  . Smokeless tobacco: Never Used     Comment: Getting ready to start nicotine patches RX by Dr. Gwenlyn Found per pt.  . Alcohol use No  . Drug use: No     Comment: 05/09/2015.  "quit 07/27/1994"  . Sexual activity: Not Currently    Birth control/ protection: Abstinence   Other Topics Concern  . Not on file   Social History Narrative  . No narrative on file     Review of Systems: General: negative for chills, fever, night sweats or weight changes.  Cardiovascular: negative for chest pain, dyspnea on exertion, edema, orthopnea, palpitations, paroxysmal nocturnal dyspnea or shortness of breath Dermatological: negative for rash Respiratory: negative for cough or wheezing Urologic: negative for hematuria Abdominal: negative for nausea, vomiting, diarrhea, bright red blood per rectum, melena, or hematemesis Neurologic: negative for visual changes, syncope, or dizziness All other systems reviewed and are otherwise negative except as noted above.    Blood pressure 137/90, pulse  (!) 101, height 5\' 4"  (1.626 m), weight 143 lb 6.4 oz (65 kg).  General appearance: alert and no distress Neck: no adenopathy, no carotid bruit, no JVD, supple, symmetrical, trachea midline and thyroid not enlarged, symmetric, no tenderness/mass/nodules Lungs: clear to auscultation bilaterally Heart: regular rate and rhythm, S1, S2 normal, no murmur, click, rub or gallop Extremities: extremities normal, atraumatic, no cyanosis or edema  EKG not performed today  ASSESSMENT AND PLAN:   Peripheral arterial disease ([place]) Mrs. Malcolm returns today for follow-up of her recent lower extremity angiogram which I performed 01/29/16. She had prior intervention by myself 11/03/14 with recanalization of the left SFA CT using a Viabahn  covered stent. ABI normalize at that time that her symptoms. Because of recurrent pain and worsening Doppler studies are restudied her revealing an occluded Viabahn  covered stent. I do not think percutaneous events position was appropriate. She continues to complain of claudication in her left calf.. I believe she is a good candidate for femoropopliteal bypass grafting. She does have 2 vessel runoff with an occluded peroneal artery. I'm referring her to Dr. Trula Slade for surgical evaluation. She has stopped smoking since her procedure.      Lorretta Harp MD FACP,FACC,FAHA, [name] 02/20/2016 9:36 AM

## 2016-02-20 NOTE — Patient Instructions (Signed)
Medication Instructions:  NO CHANGES.   Follow-Up: You have been referred to [facility] FOR EVALUATION.   Your physician wants you to follow-up in: [place].  You will receive a reminder letter in the mail two months in advance. If you don't receive a letter, please call our office to schedule the follow-up appointment.   If you need a refill on your cardiac medications before your next appointment, please call your pharmacy.

## 2016-02-28 ENCOUNTER — Ambulatory Visit: Payer: Medicare Other | Admitting: Cardiovascular Disease

## 2016-03-15 ENCOUNTER — Encounter: Payer: Self-pay | Admitting: Surgery

## 2016-03-20 ENCOUNTER — Encounter: Payer: Self-pay | Admitting: Surgery

## 2016-03-20 ENCOUNTER — Ambulatory Visit (INDEPENDENT_AMBULATORY_CARE_PROVIDER_SITE_OTHER): Payer: Medicare Other | Admitting: Surgery

## 2016-03-20 VITALS — BP 118/64 | HR 94 | Temp 97.2°F | Resp 18 | Ht 64.0 in | Wt 142.0 lb

## 2016-03-20 DIAGNOSIS — I739 Peripheral vascular disease, unspecified: Secondary | ICD-10-CM | POA: Diagnosis not present

## 2016-03-20 DIAGNOSIS — I70212 Atherosclerosis of native arteries of extremities with intermittent claudication, left leg: Secondary | ICD-10-CM | POA: Diagnosis not present

## 2016-03-20 DIAGNOSIS — I70213 Atherosclerosis of native arteries of extremities with intermittent claudication, bilateral legs: Secondary | ICD-10-CM | POA: Diagnosis not present

## 2016-03-20 DIAGNOSIS — Z01810 Encounter for preprocedural cardiovascular examination: Secondary | ICD-10-CM | POA: Diagnosis not present

## 2016-03-20 NOTE — Progress Notes (Signed)
[facility] [place]  Patient name: [name] MRN: [id number] DOB: [birth year] Sex: female  REFERRING PHYSICIAN: Dr. Gwenlyn Found  REASON FOR CONSULT: PAD / claudication  HPI: [name] is a 60 y.o. female, who is evaluation of claudication, left leg greater than right.  The patient was initially seen and evaluated by Dr. Alvester Chou for treatment of critical limb ischemia.  She has undergone atherectomy and angioplasty of an occluded left superficial femoral artery as well as stenting.  The stents have now occluded.  When she initially presented, she was having a burning feeling in her feet as well as cramping in her calf with walking.  The symptoms resolved with stenting, but have now returned.  She states that she can walk approximately 100 feet.  Resting alleviates her symptoms.  The patient has a strong family history of cardiovascular disease, mainly cardiac in origin.  The patient has a history of stroke in the past with some mild left-sided symptoms.  She has a history of tobacco abuse, however recently stopped smoking.  She suffers and hypercholesterolemia which is managed with a statin.  She is medically managed for hypertension.  Past Medical History:  Diagnosis Date  . 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 Anemia   . Angioedema 07/10/2015  . Anxiety   . Arthritis    "neck, left hand" (09/14/2012)  . Asthma   . Cancer ([place]) 1985   ovarian, no treatment except surgery  . Complication of anesthesia    OCCASIONAL TROUBLE TURNING NECK TO RIGHT  . Critical lower limb ischemia    10/2014 s/p L SFA stenting  . Diverticulosis of colon with hemorrhage April 2013  . GERD (gastroesophageal reflux disease)   . H/O hiatal hernia   . Heart attack    2003 mild MI, March 2013 mild MI  . Hidradenitis    groin  . History of blood transfusion 1985 AND 2013  . Hyperlipidemia   . Hypertension   . Irritable bowel syndrome   . Left-sided weakness     since stroke, left eye trouble seeing  . Migraines   . Mild CAD    a. Cath 09/2010: mild luminal irregularities of LAD, 30% prox RCA and 20-30% mRCA, EF 65%.  . Neuropathy ([place])   . Obesity   . PAD (peripheral artery disease) ([place])    a. critical limb ischemia s/p PTA/stenting of L SFA 10/2014. c. occ prior SFA stent by angio 01/2016, for possible PV bypass.  . Recurrent upper respiratory infection (URI)   . S/P arterial stent-mid Lt SFA 11/03/14 11/04/2014  . Schatzki's ring   . Sinus problem   . Stomach ulcer 1972   non-bleeding  . Stroke ([place]) 07-2007, 07-2008, 07-2009   total 3 strokes; mild left sided weakness and left eye "jumps".  . Type II diabetes mellitus ([place])   . Vitamin B 12 deficiency 07-15-2013    Family History  Problem Relation Age of Onset  . Breast cancer Mother   . Heart disease Mother   . Hypertension Mother   . Diabetes Father   . Heart disease Father   . Stroke Father   . Heart disease Sister   . Hypertension Sister   . Hypertension Sister   . Hyperlipidemia Sister   . Diabetes Sister   . Allergic rhinitis Sister   . Emphysema      great uncle  . Aneurysm Sister     brain  . Angioedema Neg Hx   .  Asthma Neg Hx   . Eczema Neg Hx   . Immunodeficiency Neg Hx   . Urticaria Neg Hx     SOCIAL HISTORY: Social History   Social History  . Marital status: Divorced    Spouse name: N/A  . Number of children: 1  . Years of education: N/A   Occupational History  .  Unemployed   Social History Main Topics  . Smoking status: Former Smoker    Packs/day: 0.33    Years: 41.00    Types: Cigarettes    Quit date: 01/15/2016  . Smokeless tobacco: Never Used     Comment: Getting ready to start nicotine patches RX by Dr. Gwenlyn Found per pt.  . Alcohol use No  . Drug use: No     Comment: 05/09/2015.  "quit 07/27/1994"  . Sexual activity: Not Currently    Birth control/ protection: Abstinence   Other Topics Concern  . Not on file   Social History Narrative  .  No narrative on file    Allergies  Allergen Reactions  . Bactrim [Sulfamethoxazole-Trimethoprim] Shortness Of Breath  . Sulfa Antibiotics Shortness Of Breath and Palpitations  . 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 Fish Allergy Hives and Swelling    Tongue swelling  . Iodine Swelling  . Metformin And Related Diarrhea  . Adhesive [Tape] Rash    Paper tape is ok  . Shellfish Allergy Swelling and Rash    Tongue swelling    Current Outpatient Prescriptions  Medication Sig Dispense Refill  . acetaminophen (TYLENOL) 325 MG tablet Take 2 tablets (650 mg total) by mouth every 4 (four) hours as needed for headache or mild pain.    Marland Kitchen albuterol (PROAIR HFA) 108 (90 Base) MCG/ACT inhaler 1-2 inhalations every 4-6 hours as needed for cough or wheeze. 1 Inhaler 1  . ALPRAZolam (XANAX) 0.5 MG tablet Take 0.25-0.5 mg by mouth at bedtime as needed for anxiety or sleep.     Marland Kitchen aspirin EC 81 MG tablet Take 81 mg by mouth daily.    . Azelastine HCl 0.15 % SOLN Place 2 sprays into both nostrils 2 (two) times daily. 30 mL 5  . b complex vitamins tablet Take 1 tablet by mouth daily.    . Calcium Carb-Cholecalciferol (CALCIUM 600 + D PO) Take 1 tablet by mouth daily.    . cephALEXin (KEFLEX) 250 MG capsule Take 250 mg by mouth 3 (three) times daily.    . citalopram (CELEXA) 10 MG tablet Take 10 mg by mouth daily.    . clopidogrel (PLAVIX) 75 MG tablet Take 75 mg by mouth at bedtime.     . dicyclomine (BENTYL) 20 MG tablet Take 1 tablet (20 mg total) by mouth 3 (three) times daily before meals. 90 tablet 1  . doxycycline (DORYX) 100 MG EC tablet Take 100 mg by mouth daily.    Marland Kitchen EPINEPHrine 0.3 mg/0.3 mL IJ SOAJ injection Inject 0.3 mLs (0.3 mg total) into the muscle once. 1 Device 1  . Fenofibric Acid 105 MG TABS Take by mouth.    Marland Kitchen ibuprofen (ADVIL,MOTRIN) 800 MG tablet Take 800 mg by mouth every 8 (eight) hours as needed.    . insulin aspart (NOVOLOG) 100  UNIT/ML injection Inject 5-12 Units into the skin 3 (three) times daily before meals. Per sliding scale Patient not currently using Novolog using Humalog.  Patient switches back and forth.    . Insulin Glargine (TOUJEO  SOLOSTAR) 300 UNIT/ML SOPN Inject 30 Units into the skin every morning.     . insulin lispro (HUMALOG) 100 UNIT/ML injection Inject 5-12 Units into the skin as directed. 5 units before lunch and dinner. > 200 8 units, > 250 10 units, > 300 12 units     . loratadine (CLARITIN) 10 MG tablet Take 10 mg by mouth daily as needed for allergies.    Marland Kitchen meclizine (ANTIVERT) 25 MG tablet Take 25 mg by mouth 3 (three) times daily as needed for dizziness.    . mometasone (NASONEX) 50 MCG/ACT nasal spray 1-2 sprays in each nostril once daily for stuffy nose or drainage. (Patient taking differently: Place 2 sprays into the nose daily as needed (nasal congestion). ) 17 g 5  . mupirocin cream (BACTROBAN) 2 % Apply 1 application topically 2 (two) times daily as needed (wound care (boils)).     . nitroGLYCERIN (NITRODUR - DOSED IN MG/24 HR) 0.4 mg/hr Place 0.4 mg onto the skin as needed (chest pain). Reported on 07/10/2015    . nitroGLYCERIN (NITROSTAT) 0.4 MG SL tablet Place 1 tablet (0.4 mg total) under the tongue every 5 (five) minutes as needed for chest pain. Maximum 3 doses 25 tablet 12  . Olmesartan-Amlodipine-HCTZ (TRIBENZOR) 20-5-12.5 MG TABS Take 1 tablet by mouth daily.    . ONE TOUCH ULTRA TEST test strip     . pantoprazole (PROTONIX) 40 MG tablet TAKE 1 TABLET (40 MG TOTAL) BY MOUTH DAILY. 90 tablet 2  . potassium chloride SA (K-DUR,KLOR-CON) 20 MEQ tablet Take 20 mEq by mouth daily as needed (for cramps).    . ranitidine (ZANTAC) 150 MG tablet Use one tablet one to two times daily as directed. (Patient taking differently: Take 150 mg by mouth 2 (two) times daily as needed for heartburn. Use one tablet one to two times daily as directed.) 60 tablet 5  . rosuvastatin (CRESTOR) 40 MG tablet Take  40 mg by mouth daily.    . SUMAtriptan (IMITREX) 50 MG tablet Take 50 mg by mouth every 2 (two) hours as needed for migraine.    . TRULICITY A999333 0000000 SOPN Inject 0.75 mg into the skin every 7 (seven) days. On Thursdays    . zolpidem (AMBIEN) 10 MG tablet Take 5 mg by mouth at bedtime as needed for sleep.     No current facility-administered medications for this visit.     REVIEW OF SYSTEMS:  [X]  denotes positive finding, [ ]  denotes negative finding Cardiac  Comments:  Chest pain or chest pressure:    Shortness of breath upon exertion:    Short of breath when lying flat:    Irregular heart rhythm:        Vascular    Pain in calf, thigh, or hip brought on by ambulation: x   Pain in feet at night that wakes you up from your sleep:  x   Blood clot in your veins:    Leg swelling:         Pulmonary    Oxygen at home:    Productive cough:     Wheezing:         Neurologic    Sudden weakness in arms or legs:     Sudden numbness in arms or legs:     Sudden onset of difficulty speaking or slurred speech:    Temporary loss of vision in one eye:     Problems with dizziness:  Gastrointestinal    Blood in stool:     Vomited blood:         Genitourinary    Burning when urinating:     Blood in urine:        Psychiatric    Major depression:         Hematologic    Bleeding problems:    Problems with blood clotting too easily:        Skin    Rashes or ulcers:        Constitutional    Fever or chills:      PHYSICAL EXAM: Vitals:   03/20/16 1022  BP: 118/64  Pulse: 94  Resp: 18  Temp: 97.2 F (36.2 C)  TempSrc: Oral  SpO2: 97%  Weight: 142 lb (64.4 kg)  Height: 5\' 4"  (1.626 m)    GENERAL: The patient is a well-nourished female, in no acute distress. The vital signs are documented above. CARDIAC: There is a regular rate and rhythm.  VASCULAR: No carotid bruits PULMONARY: There is good air exchange bilaterally without wheezing or rales. ABDOMEN: Soft and  non-tender with normal pitched bowel sounds.  MUSCULOSKELETAL: There are no major deformities or cyanosis. NEUROLOGIC: No focal weakness or paresthesias are detected. SKIN: There are no ulcers or rashes noted. PSYCHIATRIC: The patient has a normal affect.  DATA:  I have reviewed her arteriogram which shows left superficial femoral artery occlusion with reconstitution of the above-knee popliteal artery.  ASSESSMENT AND PLAN: Claudication: I discussed treatment options with the patient including left femoral-popliteal bypass graft versus continuing medical therapy.  Because of the holiday and a planned move, she would like to delay any surgery at this time.  I stressed to her the importance of an exercise program.  We have elected to reevaluate things in 3 months.  At that time I will get vein mapping in case we decide to proceed with bypass graft.  We discussed that if she develops a nonhealing wound, rest pain or infection to contact me immediately.   Annamarie Major, MD [facility] [phone] Pager [phone]

## 2016-03-21 DIAGNOSIS — Z23 Encounter for immunization: Secondary | ICD-10-CM | POA: Diagnosis not present

## 2016-03-26 DIAGNOSIS — Z23 Encounter for immunization: Secondary | ICD-10-CM | POA: Diagnosis not present

## 2016-06-03 ENCOUNTER — Encounter: Payer: Self-pay | Admitting: Surgery

## 2016-06-04 ENCOUNTER — Other Ambulatory Visit (HOSPITAL_COMMUNITY): Payer: Self-pay | Admitting: Internal Medicine

## 2016-06-04 DIAGNOSIS — Z1231 Encounter for screening mammogram for malignant neoplasm of breast: Secondary | ICD-10-CM

## 2016-06-07 ENCOUNTER — Ambulatory Visit (HOSPITAL_COMMUNITY)
Admission: RE | Admit: 2016-06-07 | Discharge: 2016-06-07 | Disposition: A | Discharge status: Home / Self Care | Payer: Medicare Other | Source: Ambulatory Visit | Attending: Surgery | Admitting: Surgery

## 2016-06-07 DIAGNOSIS — I739 Peripheral vascular disease, unspecified: Secondary | ICD-10-CM | POA: Insufficient documentation

## 2016-06-07 DIAGNOSIS — Z01810 Encounter for preprocedural cardiovascular examination: Secondary | ICD-10-CM | POA: Insufficient documentation

## 2016-06-10 ENCOUNTER — Other Ambulatory Visit: Payer: Self-pay

## 2016-06-10 ENCOUNTER — Encounter (HOSPITAL_COMMUNITY): Payer: Medicare Other

## 2016-06-10 ENCOUNTER — Ambulatory Visit: Payer: Medicare Other

## 2016-06-10 ENCOUNTER — Encounter: Payer: Self-pay | Admitting: Surgery

## 2016-06-10 ENCOUNTER — Ambulatory Visit (INDEPENDENT_AMBULATORY_CARE_PROVIDER_SITE_OTHER): Payer: Medicare Other | Admitting: Surgery

## 2016-06-10 VITALS — BP 131/77 | HR 93 | Temp 98.2°F | Resp 16 | Ht 64.0 in | Wt 145.0 lb

## 2016-06-10 DIAGNOSIS — I70212 Atherosclerosis of native arteries of extremities with intermittent claudication, left leg: Secondary | ICD-10-CM

## 2016-06-10 NOTE — Progress Notes (Signed)
Vascular and Vein Specialist of [place]  Patient name: [name] MRN: [id number] DOB: Sep 14, 1955 Sex: female   REASON FOR VISIT:    Follow up claudication  HISOTRY OF PRESENT ILLNESS:    [name] is a 61 y.o. female, who is evaluation of claudication, left leg greater than right.  The patient was initially seen and evaluated by Dr. Alvester Chou for treatment of critical limb ischemia.  She has undergone atherectomy and angioplasty of an occluded left superficial femoral artery as well as stenting.  The stents have now occluded.  When she initially presented, she was having a burning feeling in her feet as well as cramping in her calf with walking.  The symptoms resolved with stenting, but have now returned.  She states that she can walk approximately 100 feet.  Resting alleviates her symptoms.  She comes back today because of worsening pain in her legs.  She will occasionally wake up in the middle the night with pain in her feet.  She does not have any open wounds  The patient has a strong family history of cardiovascular disease, mainly cardiac in origin.  The patient has a history of stroke in the past with some mild left-sided symptoms.  She has a history of tobacco abuse, however recently stopped smoking.  She suffers and hypercholesterolemia which is managed with a statin.  She is medically managed for hypertension.   PAST MEDICAL HISTORY:   Past Medical History:  Diagnosis Date  . 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 Anemia   . Angioedema 07/10/2015  . Anxiety   . Arthritis    "neck, left hand" (09/14/2012)  . Asthma   . Cancer ([place]) 1985   ovarian, no treatment except surgery  . Complication of anesthesia    OCCASIONAL TROUBLE TURNING NECK TO RIGHT  . Critical lower limb ischemia    10/2014 s/p L SFA stenting  . Diverticulosis of colon with hemorrhage April 2013  . GERD (gastroesophageal reflux disease)   . H/O hiatal hernia   . Heart  attack    2003 mild MI, March 2013 mild MI  . Hidradenitis    groin  . History of blood transfusion 1985 AND 2013  . Hyperlipidemia   . Hypertension   . Irritable bowel syndrome   . Left-sided weakness    since stroke, left eye trouble seeing  . Migraines   . Mild CAD    a. Cath 09/2010: mild luminal irregularities of LAD, 30% prox RCA and 20-30% mRCA, EF 65%.  . Neuropathy ([place])   . Obesity   . PAD (peripheral artery disease) ([place])    a. critical limb ischemia s/p PTA/stenting of L SFA 10/2014. c. occ prior SFA stent by angio 01/2016, for possible PV bypass.  . Recurrent upper respiratory infection (URI)   . S/P arterial stent-mid Lt SFA 11/03/14 11/04/2014  . Schatzki's ring   . Sinus problem   . Stomach ulcer 1972   non-bleeding  . Stroke ([place]) 07-2007, 07-2008, 07-2009   total 3 strokes; mild left sided weakness and left eye "jumps".  . Type II diabetes mellitus ([place])   . Vitamin B 12 deficiency 07-15-2013     FAMILY HISTORY:   Family History  Problem Relation Age of Onset  . Breast cancer Mother   . Heart disease Mother   . Hypertension Mother   . Diabetes Father   . Heart disease Father   . Stroke Father   . Heart disease Sister   .  Hypertension Sister   . Hypertension Sister   . Hyperlipidemia Sister   . Diabetes Sister   . Allergic rhinitis Sister   . Emphysema      great uncle  . Aneurysm Sister     brain  . Angioedema Neg Hx   . Asthma Neg Hx   . Eczema Neg Hx   . Immunodeficiency Neg Hx   . Urticaria Neg Hx     SOCIAL HISTORY:   Social History  Substance Use Topics  . Smoking status: Former Smoker    Packs/day: 0.33    Years: 41.00    Types: Cigarettes    Quit date: 01/15/2016  . Smokeless tobacco: Never Used     Comment: Getting ready to start nicotine patches RX by Dr. Gwenlyn Found per pt.  . Alcohol use No     ALLERGIES:   Allergies  Allergen Reactions  . Bactrim [Sulfamethoxazole-Trimethoprim] Shortness Of Breath  . Sulfa Antibiotics  Shortness Of Breath and Palpitations  . 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 Fish Allergy Hives and Swelling    Tongue swelling  . Iodine Swelling  . Metformin And Related Diarrhea  . Adhesive [Tape] Rash    Paper tape is ok  . Shellfish Allergy Swelling and Rash    Tongue swelling     CURRENT MEDICATIONS:   Current Outpatient Prescriptions  Medication Sig Dispense Refill  . acetaminophen (TYLENOL) 325 MG tablet Take 2 tablets (650 mg total) by mouth every 4 (four) hours as needed for headache or mild pain.    Marland Kitchen albuterol (PROAIR HFA) 108 (90 Base) MCG/ACT inhaler 1-2 inhalations every 4-6 hours as needed for cough or wheeze. 1 Inhaler 1  . ALPRAZolam (XANAX) 0.5 MG tablet Take 0.25-0.5 mg by mouth at bedtime as needed for anxiety or sleep.     Marland Kitchen aspirin EC 81 MG tablet Take 81 mg by mouth daily.    . Azelastine HCl 0.15 % SOLN Place 2 sprays into both nostrils 2 (two) times daily. 30 mL 5  . b complex vitamins tablet Take 1 tablet by mouth daily.    . Calcium Carb-Cholecalciferol (CALCIUM 600 + D PO) Take 1 tablet by mouth daily.    . cephALEXin (KEFLEX) 250 MG capsule Take 250 mg by mouth 3 (three) times daily.    . citalopram (CELEXA) 10 MG tablet Take 10 mg by mouth daily.    . clopidogrel (PLAVIX) 75 MG tablet Take 75 mg by mouth at bedtime.     . dicyclomine (BENTYL) 20 MG tablet Take 1 tablet (20 mg total) by mouth 3 (three) times daily before meals. 90 tablet 1  . doxycycline (DORYX) 100 MG EC tablet Take 100 mg by mouth daily.    Marland Kitchen EPINEPHrine 0.3 mg/0.3 mL IJ SOAJ injection Inject 0.3 mLs (0.3 mg total) into the muscle once. 1 Device 1  . Fenofibric Acid 105 MG TABS Take by mouth.    Marland Kitchen ibuprofen (ADVIL,MOTRIN) 800 MG tablet Take 800 mg by mouth every 8 (eight) hours as needed.    . insulin aspart (NOVOLOG) 100 UNIT/ML injection Inject 5-12 Units into the skin 3 (three) times daily before meals. Per sliding scale Patient not  currently using Novolog using Humalog.  Patient switches back and forth.    . Insulin Glargine (TOUJEO SOLOSTAR) 300 UNIT/ML SOPN Inject 30 Units into the skin every morning.     . insulin lispro (HUMALOG) 100 UNIT/ML injection  Inject 5-12 Units into the skin as directed. 5 units before lunch and dinner. > 200 8 units, > 250 10 units, > 300 12 units     . loratadine (CLARITIN) 10 MG tablet Take 10 mg by mouth daily as needed for allergies.    Marland Kitchen meclizine (ANTIVERT) 25 MG tablet Take 25 mg by mouth 3 (three) times daily as needed for dizziness.    . mometasone (NASONEX) 50 MCG/ACT nasal spray 1-2 sprays in each nostril once daily for stuffy nose or drainage. (Patient taking differently: Place 2 sprays into the nose daily as needed (nasal congestion). ) 17 g 5  . mupirocin cream (BACTROBAN) 2 % Apply 1 application topically 2 (two) times daily as needed (wound care (boils)).     . nitroGLYCERIN (NITRODUR - DOSED IN MG/24 HR) 0.4 mg/hr Place 0.4 mg onto the skin as needed (chest pain). Reported on 07/10/2015    . nitroGLYCERIN (NITROSTAT) 0.4 MG SL tablet Place 1 tablet (0.4 mg total) under the tongue every 5 (five) minutes as needed for chest pain. Maximum 3 doses 25 tablet 12  . Olmesartan-Amlodipine-HCTZ (TRIBENZOR) 20-5-12.5 MG TABS Take 1 tablet by mouth daily.    . ONE TOUCH ULTRA TEST test strip     . pantoprazole (PROTONIX) 40 MG tablet TAKE 1 TABLET (40 MG TOTAL) BY MOUTH DAILY. 90 tablet 2  . potassium chloride SA (K-DUR,KLOR-CON) 20 MEQ tablet Take 20 mEq by mouth daily as needed (for cramps).    . ranitidine (ZANTAC) 150 MG tablet Use one tablet one to two times daily as directed. (Patient taking differently: Take 150 mg by mouth 2 (two) times daily as needed for heartburn. Use one tablet one to two times daily as directed.) 60 tablet 5  . rosuvastatin (CRESTOR) 40 MG tablet Take 40 mg by mouth daily.    . SUMAtriptan (IMITREX) 50 MG tablet Take 50 mg by mouth every 2 (two) hours as needed for  migraine.    . TRULICITY A999333 0000000 SOPN Inject 0.75 mg into the skin every 7 (seven) days. On Thursdays    . zolpidem (AMBIEN) 10 MG tablet Take 5 mg by mouth at bedtime as needed for sleep.     No current facility-administered medications for this visit.     REVIEW OF SYSTEMS:   [X]  denotes positive finding, [ ]  denotes negative finding Cardiac  Comments:  Chest pain or chest pressure: x   Shortness of breath upon exertion: x   Short of breath when lying flat:    Irregular heart rhythm:        Vascular    Pain in calf, thigh, or hip brought on by ambulation: x   Pain in feet at night that wakes you up from your sleep:  x   Blood clot in your veins:    Leg swelling:  x       Pulmonary    Oxygen at home:    Productive cough:     Wheezing:         Neurologic    Sudden weakness in arms or legs:     Sudden numbness in arms or legs:  x   Sudden onset of difficulty speaking or slurred speech:    Temporary loss of vision in one eye:     Problems with dizziness:         Gastrointestinal    Blood in stool:     Vomited blood:         Genitourinary  Burning when urinating:     Blood in urine:        Psychiatric    Major depression:         Hematologic    Bleeding problems:    Problems with blood clotting too easily:        Skin    Rashes or ulcers:        Constitutional    Fever or chills:      PHYSICAL EXAM:   Vitals:   06/10/16 1300  BP: 131/77  Pulse: 93  Resp: 16  Temp: 98.2 F (36.8 C)  TempSrc: Oral  SpO2: 98%  Weight: 145 lb (65.8 kg)  Height: 5\' 4"  (1.626 m)    GENERAL: The patient is a well-nourished female, in no acute distress. The vital signs are documented above. CARDIAC: There is a regular rate and rhythm.  VASCULAR: Nonpalpable pedal pulses PULMONARY: Non-labored respirations vein mapping shows a small left saphenous vein.   MUSCULOSKELETAL: There are no major deformities or cyanosis. NEUROLOGIC: No focal weakness or paresthesias  are detected. SKIN: There are no ulcers or rashes noted. PSYCHIATRIC: The patient has a normal affect.  STUDIES:   vein mapping shows a small left saphenous vein.    MEDICAL ISSUES:   Left leg claudication: I tried to persuade the patient to continue with medical management as opposed to proceeding with femoral-popliteal bypass grafting.  She is adamant that she wants to have the surgery done.  I told her that this would need to be done with Gore-Tex as her saphenous vein is not adequate on the left side.  I discussed the potential complications and discussed the long-term patency of the bypass graft.  She understands all this and wants to get this done as soon as possible.  I will stop her Plavix and schedule her for Wednesday, February 14 for a left femoral to above-knee popliteal bypass graft with Gore-Tex.    Annamarie Major, MD [facility] [phone] Pager [phone]

## 2016-06-12 ENCOUNTER — Encounter (HOSPITAL_COMMUNITY): Payer: Self-pay

## 2016-06-12 ENCOUNTER — Encounter: Payer: Self-pay | Admitting: Internal Medicine

## 2016-06-12 ENCOUNTER — Ambulatory Visit (HOSPITAL_COMMUNITY)
Admission: RE | Admit: 2016-06-12 | Discharge: 2016-06-12 | Disposition: A | Discharge status: Home / Self Care | Payer: Medicare Other | Source: Ambulatory Visit | Attending: Internal Medicine | Admitting: Internal Medicine

## 2016-06-12 DIAGNOSIS — Z1231 Encounter for screening mammogram for malignant neoplasm of breast: Secondary | ICD-10-CM | POA: Diagnosis not present

## 2016-06-17 ENCOUNTER — Encounter (HOSPITAL_COMMUNITY): Payer: Self-pay

## 2016-06-17 ENCOUNTER — Encounter (HOSPITAL_COMMUNITY)
Admission: RE | Admit: 2016-06-17 | Discharge: 2016-06-17 | Disposition: A | Discharge status: Home / Self Care | Payer: Medicare Other | Source: Ambulatory Visit | Attending: Surgery | Admitting: Surgery

## 2016-06-17 ENCOUNTER — Ambulatory Visit: Payer: Medicare Other | Admitting: Surgery

## 2016-06-17 DIAGNOSIS — Z79899 Other long term (current) drug therapy: Secondary | ICD-10-CM | POA: Diagnosis not present

## 2016-06-17 DIAGNOSIS — Z91013 Allergy to seafood: Secondary | ICD-10-CM | POA: Diagnosis not present

## 2016-06-17 DIAGNOSIS — Z881 Allergy status to other antibiotic agents status: Secondary | ICD-10-CM | POA: Diagnosis not present

## 2016-06-17 DIAGNOSIS — Z7951 Long term (current) use of inhaled steroids: Secondary | ICD-10-CM | POA: Diagnosis not present

## 2016-06-17 DIAGNOSIS — R110 Nausea: Secondary | ICD-10-CM | POA: Diagnosis not present

## 2016-06-17 DIAGNOSIS — I70212 Atherosclerosis of native arteries of extremities with intermittent claudication, left leg: Secondary | ICD-10-CM | POA: Diagnosis not present

## 2016-06-17 DIAGNOSIS — E785 Hyperlipidemia, unspecified: Secondary | ICD-10-CM | POA: Diagnosis not present

## 2016-06-17 DIAGNOSIS — Z888 Allergy status to other drugs, medicaments and biological substances status: Secondary | ICD-10-CM | POA: Diagnosis not present

## 2016-06-17 DIAGNOSIS — I252 Old myocardial infarction: Secondary | ICD-10-CM | POA: Diagnosis not present

## 2016-06-17 DIAGNOSIS — Z8249 Family history of ischemic heart disease and other diseases of the circulatory system: Secondary | ICD-10-CM | POA: Diagnosis not present

## 2016-06-17 DIAGNOSIS — T82856A Stenosis of peripheral vascular stent, initial encounter: Secondary | ICD-10-CM | POA: Diagnosis not present

## 2016-06-17 DIAGNOSIS — Z01818 Encounter for other preprocedural examination: Secondary | ICD-10-CM

## 2016-06-17 DIAGNOSIS — K219 Gastro-esophageal reflux disease without esophagitis: Secondary | ICD-10-CM | POA: Diagnosis not present

## 2016-06-17 DIAGNOSIS — Z7982 Long term (current) use of aspirin: Secondary | ICD-10-CM | POA: Diagnosis not present

## 2016-06-17 DIAGNOSIS — Z7901 Long term (current) use of anticoagulants: Secondary | ICD-10-CM | POA: Insufficient documentation

## 2016-06-17 DIAGNOSIS — Z87891 Personal history of nicotine dependence: Secondary | ICD-10-CM | POA: Diagnosis not present

## 2016-06-17 DIAGNOSIS — E7800 Pure hypercholesterolemia, unspecified: Secondary | ICD-10-CM | POA: Diagnosis not present

## 2016-06-17 DIAGNOSIS — Z794 Long term (current) use of insulin: Secondary | ICD-10-CM | POA: Diagnosis not present

## 2016-06-17 DIAGNOSIS — Z882 Allergy status to sulfonamides status: Secondary | ICD-10-CM | POA: Diagnosis not present

## 2016-06-17 DIAGNOSIS — E1151 Type 2 diabetes mellitus with diabetic peripheral angiopathy without gangrene: Secondary | ICD-10-CM | POA: Diagnosis not present

## 2016-06-17 DIAGNOSIS — H538 Other visual disturbances: Secondary | ICD-10-CM | POA: Diagnosis not present

## 2016-06-17 DIAGNOSIS — K449 Diaphragmatic hernia without obstruction or gangrene: Secondary | ICD-10-CM | POA: Diagnosis not present

## 2016-06-17 DIAGNOSIS — I2510 Atherosclerotic heart disease of native coronary artery without angina pectoris: Secondary | ICD-10-CM | POA: Diagnosis not present

## 2016-06-17 DIAGNOSIS — Z91048 Other nonmedicinal substance allergy status: Secondary | ICD-10-CM | POA: Diagnosis not present

## 2016-06-17 DIAGNOSIS — I10 Essential (primary) hypertension: Secondary | ICD-10-CM | POA: Diagnosis not present

## 2016-06-17 DIAGNOSIS — I69354 Hemiplegia and hemiparesis following cerebral infarction affecting left non-dominant side: Secondary | ICD-10-CM | POA: Diagnosis not present

## 2016-06-17 LAB — COMPREHENSIVE METABOLIC PANEL
ALT: 19 U/L (ref 14–54)
AST: 23 U/L (ref 15–41)
Albumin: 4 g/dL (ref 3.5–5.0)
Alkaline Phosphatase: 42 U/L (ref 38–126)
Anion gap: 10 (ref 5–15)
BUN: 8 mg/dL (ref 6–20)
CO2: 26 mmol/L (ref 22–32)
Calcium: 10 mg/dL (ref 8.9–10.3)
Chloride: 106 mmol/L (ref 101–111)
Creatinine, Ser: 0.83 mg/dL (ref 0.44–1.00)
GFR calc Af Amer: >60 mL/min (ref >60)
GFR calc non Af Amer: >60 mL/min (ref >60)
Glucose, Bld: 126 mg/dL — ABNORMAL HIGH (ref 65–99)
Potassium: 3.7 mmol/L (ref 3.5–5.1)
Sodium: 142 mmol/L (ref 135–145)
Total Bilirubin: 0.9 mg/dL (ref 0.3–1.2)
Total Protein: 6.8 g/dL (ref 6.5–8.1)

## 2016-06-17 LAB — URINALYSIS, ROUTINE W REFLEX MICROSCOPIC
Bilirubin Urine: NEGATIVE
Glucose, UA: NEGATIVE mg/dL
Hgb urine dipstick: NEGATIVE
Ketones, ur: NEGATIVE mg/dL
Nitrite: NEGATIVE
Protein, ur: NEGATIVE mg/dL
Specific Gravity, Urine: 1.011 (ref 1.005–1.030)
pH: 5 (ref 5.0–8.0)

## 2016-06-17 LAB — SURGICAL PCR SCREEN
MRSA, PCR: NEGATIVE
Staphylococcus aureus: NEGATIVE

## 2016-06-17 LAB — APTT: aPTT: 35 seconds (ref 24–36)

## 2016-06-17 LAB — CBC
HCT: 36.9 % (ref 36.0–46.0)
Hemoglobin: 12 g/dL (ref 12.0–15.0)
MCH: 26.5 pg (ref 26.0–34.0)
MCHC: 32.5 g/dL (ref 30.0–36.0)
MCV: 81.5 fL (ref 78.0–100.0)
Platelets: 206 10*3/uL (ref 150–400)
RBC: 4.53 MIL/uL (ref 3.87–5.11)
RDW: 12.7 % (ref 11.5–15.5)
WBC: 4 10*3/uL (ref 4.0–10.5)

## 2016-06-17 LAB — PROTIME-INR
INR: 1.03
Prothrombin Time: 13.6 seconds (ref 11.4–15.2)

## 2016-06-17 LAB — GLUCOSE, CAPILLARY: Glucose-Capillary: 165 mg/dL — ABNORMAL HIGH (ref 65–99)

## 2016-06-17 NOTE — Pre-Procedure Instructions (Addendum)
[name]  06/17/2016      [id number] - [place], [place] [address] AT [address] [place] [address] Phone: [phone] Fax: [phone]    Your procedure is scheduled on 06/19/16  Report to [facility] Admitting at 630 A.M.  Call this number if you have problems the morning of surgery:  [phone]   Remember:  Do not eat food or drink liquids after midnight.  Take these medicines the morning of surgery with A SIP OF WATER     Inhaler if needed (bring),citalopam(celexa),dicyclomine(bentyl),nitro if needed,  STOP all herbel meds, nsaids (aleve,naproxen,advil,ibuprofen)  Today including all vitamins/supplements  Plavix per dr (last dose 06/13/16)     How to Manage Your Diabetes Before and After Surgery  Why is it important to control my blood sugar before and after surgery? . Improving blood sugar levels before and after surgery helps healing and can limit problems. . A way of improving blood sugar control is eating a healthy diet by: o  Eating less sugar and carbohydrates o  Increasing activity/exercise o  Talking with your doctor about reaching your blood sugar goals . High blood sugars (greater than 180 mg/dL) can raise your risk of infections and slow your recovery, so you will need to focus on controlling your diabetes during the weeks before surgery. . Make sure that the doctor who takes care of your diabetes knows about your planned surgery including the date and location.  How do I manage my blood sugar before surgery? . Check your blood sugar at least 4 times a day, starting 2 days before surgery, to make sure that the level is not too high or low. o Check your blood sugar the morning of your surgery when you wake up and every 2 hours until you get to the Short Stay unit. . If your blood sugar is less than 70 mg/dL, you will need to treat for low blood sugar: o Do not take insulin. o Treat a low blood sugar  (less than 70 mg/dL) with  cup of clear juice (cranberry or apple), 4 glucose tablets, OR glucose gel. o Recheck blood sugar in 15 minutes after treatment (to make sure it is greater than 70 mg/dL). If your blood sugar is not greater than 70 mg/dL on recheck, call [phone] for further instructions. . Report your blood sugar to the short stay nurse when you get to Short Stay.  . If you are admitted to the hospital after surgery: o Your blood sugar will be checked by the staff and you will probably be given insulin after surgery (instead of oral diabetes medicines) to make sure you have good blood sugar levels. o The goal for blood sugar control after surgery is 80-180 mg/dl  WHAT DO I DO ABOUT MY DIABETES MEDICATION?  THE NIGHT BEFORE SURGERY,  novolog or humalog  Usual meal time dose but if take at bedtime DO NOT TAKE  THE MORNING OF SURGERY, NO insulin except as inst below .  If your CBG is greater than 220 mg/dL, you may take  of your sliding scale (correction) dose of insulin.  Other Instructions:Take 15 units of toujeo am of surgery     Do not wear jewelry, make-up or nail polish.  Do not wear lotions, powders, or perfumes, or deoderant.  Do not shave 48 hours prior to surgery.  Men may shave face and neck.  Do not bring valuables to the hospital.  [facility] is not responsible for any belongings or valuables.  Contacts, dentures or bridgework may not be worn into surgery.  Leave your suitcase in the car.  After surgery it may be brought to your room.  For patients admitted to the hospital, discharge time will be determined by your treatment team.  Patients discharged the day of surgery will not be allowed to drive home.   Special instructions:  * Special Instructions: [facility] - Preparing for Surgery  Before surgery, you can play an important role.  Because skin is not sterile, your skin needs to be as free of germs as possible.  You can reduce the number of germs on  you skin by washing with CHG (chlorahexidine gluconate) soap before surgery.  CHG is an antiseptic cleaner which kills germs and bonds with the skin to continue killing germs even after washing.  Please DO NOT use if you have an allergy to CHG or antibacterial soaps.  If your skin becomes reddened/irritated stop using the CHG and inform your nurse when you arrive at Short Stay.  Do not shave (including legs and underarms) for at least 48 hours prior to the first CHG shower.  You may shave your face.  Please follow these instructions carefully:   1.  Shower with CHG Soap the night before surgery and the morning of Surgery.  2.  If you choose to wash your hair, wash your hair first as usual with your normal shampoo.  3.  After you shampoo, rinse your hair and body thoroughly to remove the Shampoo.  4.  Use CHG as you would any other liquid soap.  You can apply chg directly  to the skin and wash gently with scrungie or a clean washcloth.  5.  Apply the CHG Soap to your body ONLY FROM THE NECK DOWN.  Do not use on open wounds or open sores.  Avoid contact with your eyes ears, mouth and genitals (private parts).  Wash genitals (private parts)       with your normal soap.  6.  Wash thoroughly, paying special attention to the area where your surgery will be performed.  7.  Thoroughly rinse your body with warm water from the neck down.  8.  DO NOT shower/wash with your normal soap after using and rinsing off the CHG Soap.  9.  Pat yourself dry with a clean towel.            10.  Wear clean pajamas.            11.  Place clean sheets on your bed the night of your first shower and do not sleep with pets.  Day of Surgery  Do not apply any lotions/deodorants the morning of surgery.  Please wear clean clothes to the hospital/surgery center.  Please read over the fact sheets that you were given.

## 2016-06-18 ENCOUNTER — Encounter (HOSPITAL_COMMUNITY): Payer: Self-pay | Admitting: Anesthesiology

## 2016-06-18 LAB — HEMOGLOBIN A1C
Hgb A1c MFr Bld: 7.3 % — ABNORMAL HIGH (ref 4.8–5.6)
Mean Plasma Glucose: 163 mg/dL

## 2016-06-18 NOTE — Anesthesia Preprocedure Evaluation (Addendum)
Anesthesia Evaluation  Patient identified by MRN, date of birth, ID band Patient awake    Reviewed: Allergy & Precautions, NPO status , Patient's Chart, lab work & pertinent test results  History of Anesthesia Complications (+) history of anesthetic complications  Airway Mallampati: II  TM Distance: >3 FB Neck ROM: Full    Dental  (+) Upper Dentures, Lower Dentures, Dental Advisory Given   Pulmonary asthma , pneumonia, resolved, Recent URI , Resolved, former smoker,    Pulmonary exam normal breath sounds clear to auscultation- rhonchi       Cardiovascular hypertension, Pt. on medications + CAD, + Past MI and + Peripheral Vascular Disease  Normal cardiovascular exam Rhythm:Regular Rate:Normal  PVD with claudication   Neuro/Psych  Headaches, Anxiety Left sided weakness CVA, Residual Symptoms    GI/Hepatic Neg liver ROS, hiatal hernia, PUD, GERD  Medicated and Controlled,  Endo/Other  diabetes, Poorly Controlled, Type 2, Insulin Dependent, Oral Hypoglycemic AgentsHyperlipidemia   Renal/GU negative Renal ROS  negative genitourinary   Musculoskeletal  (+) Arthritis , Osteoarthritis,    Abdominal   Peds  Hematology  (+) anemia , On Plavix Hx/ blood transfusion Angioedema   Anesthesia Other Findings   Reproductive/Obstetrics                           Anesthesia Physical Anesthesia Plan  ASA: III  Anesthesia Plan: General   Post-op Pain Management:    Induction:   Airway Management Planned: Oral ETT  Additional Equipment: Arterial line  Intra-op Plan:   Post-operative Plan: Extubation in OR  Informed Consent:   Plan Discussed with:   Anesthesia Plan Comments:         Anesthesia Quick Evaluation

## 2016-06-18 NOTE — Progress Notes (Addendum)
Anesthesia Chart Review:  Patient is a 61 year old female scheduled for left FPBG on 06/19/16 by Dr. Trula Slade.  History includes former smoker (quit 01/15/16), HTN, HLD, DM2, chest pain history with reported MI '03 and '13 (multiple negative troponins in Epic since 07/2007 - 01/30/16) and only mild CAD by 2007 cath, PAD s/p left SFA stent '16, CVA (TIA ?) in '09 '10 '11 (reported left hemiparesis; brain MRI showed no acute abnormality on 05/22/05, 07/09/07, 07/11/08, 09/14/12, 09/14/14), asthmatic bronchitis, COPD, GERD, hiatal hernia repair, IBS, anemia, anxiety, migraines, IBS, ovarian cancer s/p surgery, hysterectomy, hidradenitis s/p perineal and groin excision hidradenitis, L4-5 lateral diskectomy '08, PLIF, C5-6 ACDF '02, C6-7 ACDF '15, breast reduction, s/p UPPP, tonsillectomy. She reports occasion difficulty turning her neck to the right.   PCP is Dr. Wende Neighbors. Cardiologist is Dr. Quay Burow. He referred her to Dr. Trula Slade for consideration of FPBG. (She has also been followed by Dr. Doylene Canard in the past.)  Meds include albuterol, Xanax, aspirin 81 mg, azelastine nasal spray, Keflex, Celexa, Plavix (last dose 06/13/16), Bentyl, doxycycline, fenofibric acid, NovoLog/Humalog, Toujeo, KCL, Claritin, meclizine, Nasonex, nitroglycerin, Benicar HCT, Protonix, Zantac, Crestor, Trulicity, Ambien.   BP (!) 147/79   Pulse 83   Temp 36.9 C   Resp 18   Ht 5\' 4"  (1.626 m)   Wt 146 lb 14.4 oz (66.6 kg)   SpO2 99%   BMI 25.22 kg/m   EKG 01/29/16: SR with marked sinus arrhythmia, septal infarct, age undetermined.   Nuclear stress test 07/05/15:  The left ventricular ejection fraction is normal (55-65%).  Nuclear stress EF: 59%.  There was no ST segment deviation noted during stress.  No T wave inversion was noted during stress.  The study is normal.  This is a low risk study.  Echo 03/28/2014 (Dr. Merrilee Jansky office; scanned under Media tab, Correspondence 04/08/14 encounter): -Normal LV size and  systolic function. Mild LVH. Mild diastolic dysfunction. EF 70% -Normal RV size and systolic function. -Normal LA and RA size.  -Normal MV, TV, AV, PV.  -No intracardiac mass.  -No pericardial effusion.  Cardiac cath on 10/04/05: Summary: LM unremarkable. LAD showed proximal calcification with mild luminal irregularities and mild mid vessel concentric narrowing. D1 and D3 were small.  D2 was unremarkable. LCX was unremarkable with a large OM1 and smaller OM2. RCA was dominant with proximal gradula tapering leading to 30% lesion and mid vessel 20-30% lesions X 2. PLA unremarkable. PAD was a narrowing vessel without visible disease. LVEF 65%. Medical therapy.  Carotid duplex 07/22/13: Impression: Plaque at both carotid bifurcations, right greater than left. No significant carotid stenosis is identified with bilateral ICA stenoses of less than 50%.  CXR 01/18/16: IMPRESSION: Mild chronic bronchitic changes, stable. No pneumonia, CHF, nor other acute cardiopulmonary abnormality.   Per 07/10/15 note by Dr. Golda Acre: Spirometry: FVC is 2.43 L (95% predicted) and FEV1 is 1.89 L (92% predicted).  Preoperative labs noted. A1c 7.3. Per Blood Bank, she needs new T&S drawn on the day of surgery.  If no acute changes then I anticipate that he can proceed as planned.  George [facility] Short Stay Center/Anesthesiology Phone [phone] 06/18/2016 12:07 PM

## 2016-06-19 ENCOUNTER — Encounter (HOSPITAL_COMMUNITY)
Admission: RE | Disposition: A | Discharge status: Home Health | Payer: Self-pay | Source: Ambulatory Visit | Attending: Surgery

## 2016-06-19 ENCOUNTER — Inpatient Hospital Stay (HOSPITAL_COMMUNITY)
Admission: RE | Admit: 2016-06-19 | Discharge: 2016-06-24 | DRG: 253 | Disposition: A | Discharge status: Home Health | Payer: Medicare Other | Source: Ambulatory Visit | Attending: Surgery | Admitting: Surgery

## 2016-06-19 ENCOUNTER — Inpatient Hospital Stay (HOSPITAL_COMMUNITY): Payer: Medicare Other | Admitting: Anesthesiology

## 2016-06-19 ENCOUNTER — Encounter (HOSPITAL_COMMUNITY): Payer: Self-pay | Admitting: Urology

## 2016-06-19 ENCOUNTER — Inpatient Hospital Stay (HOSPITAL_COMMUNITY): Payer: Medicare Other | Admitting: Vascular Surgery

## 2016-06-19 DIAGNOSIS — R110 Nausea: Secondary | ICD-10-CM | POA: Diagnosis not present

## 2016-06-19 DIAGNOSIS — Z87891 Personal history of nicotine dependence: Secondary | ICD-10-CM

## 2016-06-19 DIAGNOSIS — Z881 Allergy status to other antibiotic agents status: Secondary | ICD-10-CM

## 2016-06-19 DIAGNOSIS — K449 Diaphragmatic hernia without obstruction or gangrene: Secondary | ICD-10-CM | POA: Diagnosis present

## 2016-06-19 DIAGNOSIS — E7800 Pure hypercholesterolemia, unspecified: Secondary | ICD-10-CM | POA: Diagnosis present

## 2016-06-19 DIAGNOSIS — Z823 Family history of stroke: Secondary | ICD-10-CM

## 2016-06-19 DIAGNOSIS — E1151 Type 2 diabetes mellitus with diabetic peripheral angiopathy without gangrene: Secondary | ICD-10-CM | POA: Diagnosis not present

## 2016-06-19 DIAGNOSIS — Z833 Family history of diabetes mellitus: Secondary | ICD-10-CM

## 2016-06-19 DIAGNOSIS — I70212 Atherosclerosis of native arteries of extremities with intermittent claudication, left leg: Secondary | ICD-10-CM | POA: Diagnosis not present

## 2016-06-19 DIAGNOSIS — I739 Peripheral vascular disease, unspecified: Secondary | ICD-10-CM | POA: Diagnosis present

## 2016-06-19 DIAGNOSIS — I69354 Hemiplegia and hemiparesis following cerebral infarction affecting left non-dominant side: Secondary | ICD-10-CM

## 2016-06-19 DIAGNOSIS — K219 Gastro-esophageal reflux disease without esophagitis: Secondary | ICD-10-CM | POA: Diagnosis present

## 2016-06-19 DIAGNOSIS — Z7902 Long term (current) use of antithrombotics/antiplatelets: Secondary | ICD-10-CM

## 2016-06-19 DIAGNOSIS — H538 Other visual disturbances: Secondary | ICD-10-CM | POA: Diagnosis present

## 2016-06-19 DIAGNOSIS — F419 Anxiety disorder, unspecified: Secondary | ICD-10-CM | POA: Diagnosis present

## 2016-06-19 DIAGNOSIS — Z791 Long term (current) use of non-steroidal anti-inflammatories (NSAID): Secondary | ICD-10-CM

## 2016-06-19 DIAGNOSIS — Z95828 Presence of other vascular implants and grafts: Secondary | ICD-10-CM

## 2016-06-19 DIAGNOSIS — L500 Allergic urticaria: Secondary | ICD-10-CM

## 2016-06-19 DIAGNOSIS — J45909 Unspecified asthma, uncomplicated: Secondary | ICD-10-CM | POA: Diagnosis present

## 2016-06-19 DIAGNOSIS — Y829 Unspecified medical devices associated with adverse incidents: Secondary | ICD-10-CM | POA: Diagnosis present

## 2016-06-19 DIAGNOSIS — Z79899 Other long term (current) drug therapy: Secondary | ICD-10-CM

## 2016-06-19 DIAGNOSIS — Z8249 Family history of ischemic heart disease and other diseases of the circulatory system: Secondary | ICD-10-CM | POA: Diagnosis not present

## 2016-06-19 DIAGNOSIS — Z7951 Long term (current) use of inhaled steroids: Secondary | ICD-10-CM | POA: Diagnosis not present

## 2016-06-19 DIAGNOSIS — Z888 Allergy status to other drugs, medicaments and biological substances status: Secondary | ICD-10-CM | POA: Diagnosis not present

## 2016-06-19 DIAGNOSIS — I2510 Atherosclerotic heart disease of native coronary artery without angina pectoris: Secondary | ICD-10-CM | POA: Diagnosis present

## 2016-06-19 DIAGNOSIS — I252 Old myocardial infarction: Secondary | ICD-10-CM | POA: Diagnosis not present

## 2016-06-19 DIAGNOSIS — I10 Essential (primary) hypertension: Secondary | ICD-10-CM | POA: Diagnosis present

## 2016-06-19 DIAGNOSIS — Z885 Allergy status to narcotic agent status: Secondary | ICD-10-CM

## 2016-06-19 DIAGNOSIS — K5731 Diverticulosis of large intestine without perforation or abscess with bleeding: Secondary | ICD-10-CM | POA: Diagnosis not present

## 2016-06-19 DIAGNOSIS — Z91048 Other nonmedicinal substance allergy status: Secondary | ICD-10-CM

## 2016-06-19 DIAGNOSIS — R269 Unspecified abnormalities of gait and mobility: Secondary | ICD-10-CM | POA: Diagnosis not present

## 2016-06-19 DIAGNOSIS — K589 Irritable bowel syndrome without diarrhea: Secondary | ICD-10-CM | POA: Diagnosis present

## 2016-06-19 DIAGNOSIS — J3089 Other allergic rhinitis: Secondary | ICD-10-CM

## 2016-06-19 DIAGNOSIS — Z91013 Allergy to seafood: Secondary | ICD-10-CM | POA: Diagnosis not present

## 2016-06-19 DIAGNOSIS — M7989 Other specified soft tissue disorders: Secondary | ICD-10-CM | POA: Diagnosis not present

## 2016-06-19 DIAGNOSIS — E785 Hyperlipidemia, unspecified: Secondary | ICD-10-CM | POA: Diagnosis present

## 2016-06-19 DIAGNOSIS — Z803 Family history of malignant neoplasm of breast: Secondary | ICD-10-CM

## 2016-06-19 DIAGNOSIS — Z882 Allergy status to sulfonamides status: Secondary | ICD-10-CM

## 2016-06-19 DIAGNOSIS — Z794 Long term (current) use of insulin: Secondary | ICD-10-CM

## 2016-06-19 DIAGNOSIS — T82856A Stenosis of peripheral vascular stent, initial encounter: Principal | ICD-10-CM | POA: Diagnosis present

## 2016-06-19 DIAGNOSIS — Z8711 Personal history of peptic ulcer disease: Secondary | ICD-10-CM

## 2016-06-19 DIAGNOSIS — Z955 Presence of coronary angioplasty implant and graft: Secondary | ICD-10-CM

## 2016-06-19 DIAGNOSIS — Z825 Family history of asthma and other chronic lower respiratory diseases: Secondary | ICD-10-CM

## 2016-06-19 DIAGNOSIS — Z7982 Long term (current) use of aspirin: Secondary | ICD-10-CM | POA: Diagnosis not present

## 2016-06-19 DIAGNOSIS — Z972 Presence of dental prosthetic device (complete) (partial): Secondary | ICD-10-CM

## 2016-06-19 DIAGNOSIS — Z90710 Acquired absence of both cervix and uterus: Secondary | ICD-10-CM

## 2016-06-19 HISTORY — PX: FEMORAL-POPLITEAL BYPASS GRAFT: SHX937

## 2016-06-19 LAB — GLUCOSE, CAPILLARY
Glucose-Capillary: 202 mg/dL — ABNORMAL HIGH (ref 65–99)
Glucose-Capillary: 147 mg/dL — ABNORMAL HIGH (ref 65–99)
Glucose-Capillary: 177 mg/dL — ABNORMAL HIGH (ref 65–99)
Glucose-Capillary: 165 mg/dL — ABNORMAL HIGH (ref 65–99)

## 2016-06-19 LAB — CBC
HCT: 32.7 % — ABNORMAL LOW (ref 36.0–46.0)
Hemoglobin: 10.7 g/dL — ABNORMAL LOW (ref 12.0–15.0)
MCH: 26.9 pg (ref 26.0–34.0)
MCHC: 32.7 g/dL (ref 30.0–36.0)
MCV: 82.2 fL (ref 78.0–100.0)
Platelets: 212 10*3/uL (ref 150–400)
RBC: 3.98 MIL/uL (ref 3.87–5.11)
RDW: 12.7 % (ref 11.5–15.5)
WBC: 9.8 10*3/uL (ref 4.0–10.5)

## 2016-06-19 LAB — TYPE AND SCREEN
ABO/RH(D): B POS
Antibody Screen: POSITIVE

## 2016-06-19 LAB — CREATININE, SERUM
Creatinine, Ser: 0.91 mg/dL (ref 0.44–1.00)
GFR calc Af Amer: >60 mL/min (ref >60)
GFR calc non Af Amer: >60 mL/min (ref >60)

## 2016-06-19 SURGERY — BYPASS GRAFT FEMORAL-POPLITEAL ARTERY
Anesthesia: General | Site: Leg Upper | Laterality: Left

## 2016-06-19 MED ORDER — SODIUM CHLORIDE 0.9 % IV SOLN
INTRAVENOUS | Status: DC | PRN
  Administered 2016-06-19: 500 mL

## 2016-06-19 MED ORDER — GUAIFENESIN-DM 100-10 MG/5ML PO SYRP: 15.0000 mL | ORAL_SOLUTION | ORAL | Status: DC | PRN

## 2016-06-19 MED ORDER — BISACODYL 10 MG RE SUPP: 10.0000 mg | Freq: Every day | RECTAL | Status: DC | PRN

## 2016-06-19 MED ORDER — MECLIZINE HCL 25 MG PO TABS
25.0000 mg | ORAL_TABLET | Freq: Three times a day (TID) | ORAL | Status: DC | PRN
  Filled 2016-06-19: qty 1

## 2016-06-19 MED ORDER — SODIUM CHLORIDE 0.9 % IV SOLN
INTRAVENOUS | Status: DC
  Administered 2016-06-19: 15:00:00 via INTRAVENOUS

## 2016-06-19 MED ORDER — ONDANSETRON HCL 4 MG/2ML IJ SOLN
4.0000 mg | Freq: Four times a day (QID) | INTRAMUSCULAR | Status: DC | PRN
  Administered 2016-06-19 – 2016-06-24 (×9): 4 mg via INTRAVENOUS
  Filled 2016-06-19 (×9): qty 2

## 2016-06-19 MED ORDER — HYDROCHLOROTHIAZIDE 12.5 MG PO CAPS
12.5000 mg | ORAL_CAPSULE | Freq: Every day | ORAL | Status: DC
  Administered 2016-06-19 – 2016-06-24 (×6): 12.5 mg via ORAL
  Filled 2016-06-19 (×6): qty 1

## 2016-06-19 MED ORDER — FENTANYL CITRATE (PF) 100 MCG/2ML IJ SOLN
INTRAMUSCULAR | Status: AC
  Filled 2016-06-19: qty 2

## 2016-06-19 MED ORDER — LIDOCAINE HCL (CARDIAC) 20 MG/ML IV SOLN
INTRAVENOUS | Status: DC | PRN
  Administered 2016-06-19: 40 mg via INTRAVENOUS
  Administered 2016-06-19: 60 mg via INTRAVENOUS

## 2016-06-19 MED ORDER — SUMATRIPTAN SUCCINATE 50 MG PO TABS
50.0000 mg | ORAL_TABLET | ORAL | Status: DC | PRN
  Filled 2016-06-19: qty 1

## 2016-06-19 MED ORDER — DICYCLOMINE HCL 20 MG PO TABS
20.0000 mg | ORAL_TABLET | Freq: Three times a day (TID) | ORAL | Status: DC | PRN
  Filled 2016-06-19: qty 1

## 2016-06-19 MED ORDER — FENOFIBRATE 54 MG PO TABS
108.0000 mg | ORAL_TABLET | Freq: Every day | ORAL | Status: DC
  Administered 2016-06-20 – 2016-06-24 (×5): 108 mg via ORAL
  Filled 2016-06-19 (×6): qty 2

## 2016-06-19 MED ORDER — LACTATED RINGERS IV SOLN
INTRAVENOUS | Status: DC | PRN
  Administered 2016-06-19 (×2): via INTRAVENOUS

## 2016-06-19 MED ORDER — LORATADINE 10 MG PO TABS: 10.0000 mg | ORAL_TABLET | Freq: Every day | ORAL | Status: DC | PRN

## 2016-06-19 MED ORDER — MIDAZOLAM HCL 5 MG/5ML IJ SOLN: INTRAMUSCULAR | Status: DC | PRN

## 2016-06-19 MED ORDER — MIDAZOLAM HCL 5 MG/5ML IJ SOLN
INTRAMUSCULAR | Status: DC | PRN
  Administered 2016-06-19: 2 mg via INTRAVENOUS

## 2016-06-19 MED ORDER — FENOFIBRATE 48 MG PO TABS
96.0000 mg | ORAL_TABLET | Freq: Every day | ORAL | Status: DC
  Filled 2016-06-19 (×2): qty 2

## 2016-06-19 MED ORDER — MAGNESIUM SULFATE 2 GM/50ML IV SOLN
2.0000 g | Freq: Every day | INTRAVENOUS | Status: DC | PRN
  Filled 2016-06-19: qty 50

## 2016-06-19 MED ORDER — ROCURONIUM BROMIDE 50 MG/5ML IV SOSY
PREFILLED_SYRINGE | INTRAVENOUS | Status: AC
  Filled 2016-06-19: qty 5

## 2016-06-19 MED ORDER — ACETAMINOPHEN 650 MG RE SUPP: 325.0000 mg | RECTAL | Status: DC | PRN

## 2016-06-19 MED ORDER — CHLORHEXIDINE GLUCONATE CLOTH 2 % EX PADS: 6.0000 | MEDICATED_PAD | Freq: Once | CUTANEOUS | Status: DC

## 2016-06-19 MED ORDER — HEPARIN SODIUM (PORCINE) 1000 UNIT/ML IJ SOLN
INTRAMUSCULAR | Status: AC
  Filled 2016-06-19: qty 1

## 2016-06-19 MED ORDER — FENTANYL CITRATE (PF) 100 MCG/2ML IJ SOLN
INTRAMUSCULAR | Status: AC
  Filled 2016-06-19: qty 4

## 2016-06-19 MED ORDER — SUGAMMADEX SODIUM 200 MG/2ML IV SOLN
INTRAVENOUS | Status: DC | PRN
  Administered 2016-06-19: 150 mg via INTRAVENOUS

## 2016-06-19 MED ORDER — ONDANSETRON HCL 4 MG/2ML IJ SOLN
INTRAMUSCULAR | Status: AC
  Filled 2016-06-19: qty 2

## 2016-06-19 MED ORDER — ALUM & MAG HYDROXIDE-SIMETH 200-200-20 MG/5ML PO SUSP: 15.0000 mL | ORAL | Status: DC | PRN

## 2016-06-19 MED ORDER — DULAGLUTIDE 0.75 MG/0.5ML ~~LOC~~ SOAJ: 0.7500 mg | SUBCUTANEOUS | Status: DC

## 2016-06-19 MED ORDER — CITALOPRAM HYDROBROMIDE 20 MG PO TABS
10.0000 mg | ORAL_TABLET | Freq: Every day | ORAL | Status: DC
  Administered 2016-06-19 – 2016-06-24 (×6): 10 mg via ORAL
  Filled 2016-06-19 (×6): qty 1

## 2016-06-19 MED ORDER — METOPROLOL TARTRATE 5 MG/5ML IV SOLN
INTRAVENOUS | Status: DC | PRN
  Administered 2016-06-19 (×4): 5 mg via INTRAVENOUS

## 2016-06-19 MED ORDER — PHENYLEPHRINE HCL 10 MG/ML IJ SOLN
INTRAMUSCULAR | Status: DC | PRN
  Administered 2016-06-19 (×2): 40 ug via INTRAVENOUS
  Administered 2016-06-19: 80 ug via INTRAVENOUS
  Administered 2016-06-19: 40 ug via INTRAVENOUS

## 2016-06-19 MED ORDER — METOCLOPRAMIDE HCL 5 MG/ML IJ SOLN: 10.0000 mg | Freq: Once | INTRAMUSCULAR | Status: DC | PRN

## 2016-06-19 MED ORDER — PANTOPRAZOLE SODIUM 40 MG PO TBEC
DELAYED_RELEASE_TABLET | ORAL | Status: AC
  Filled 2016-06-19: qty 1

## 2016-06-19 MED ORDER — CLOPIDOGREL BISULFATE 75 MG PO TABS
75.0000 mg | ORAL_TABLET | Freq: Every day | ORAL | Status: DC
  Administered 2016-06-20 – 2016-06-23 (×4): 75 mg via ORAL
  Filled 2016-06-19 (×4): qty 1

## 2016-06-19 MED ORDER — HYDROMORPHONE HCL 1 MG/ML IJ SOLN
INTRAMUSCULAR | Status: AC
  Administered 2016-06-19: 0.5 mg via INTRAVENOUS
  Filled 2016-06-19: qty 1

## 2016-06-19 MED ORDER — INSULIN GLARGINE 100 UNIT/ML ~~LOC~~ SOLN
30.0000 [IU] | Freq: Every morning | SUBCUTANEOUS | Status: DC
  Administered 2016-06-20 – 2016-06-24 (×5): 30 [IU] via SUBCUTANEOUS
  Filled 2016-06-19 (×5): qty 0.3

## 2016-06-19 MED ORDER — PHENOL 1.4 % MT LIQD
1.0000 | OROMUCOSAL | Status: DC | PRN
  Administered 2016-06-20: 1 via OROMUCOSAL
  Filled 2016-06-19: qty 177

## 2016-06-19 MED ORDER — FLUTICASONE PROPIONATE 50 MCG/ACT NA SUSP: 1.0000 | Freq: Every day | NASAL | Status: DC | PRN

## 2016-06-19 MED ORDER — POTASSIUM CHLORIDE CRYS ER 20 MEQ PO TBCR: 20.0000 meq | EXTENDED_RELEASE_TABLET | Freq: Every day | ORAL | Status: DC | PRN

## 2016-06-19 MED ORDER — DOXYCYCLINE HYCLATE 100 MG PO TABS
100.0000 mg | ORAL_TABLET | Freq: Every day | ORAL | Status: DC
  Administered 2016-06-19 – 2016-06-24 (×6): 100 mg via ORAL
  Filled 2016-06-19 (×6): qty 1

## 2016-06-19 MED ORDER — FENTANYL CITRATE (PF) 100 MCG/2ML IJ SOLN
INTRAMUSCULAR | Status: DC | PRN
  Administered 2016-06-19 (×3): 50 ug via INTRAVENOUS
  Administered 2016-06-19: 100 ug via INTRAVENOUS

## 2016-06-19 MED ORDER — ALPRAZOLAM 0.25 MG PO TABS
0.2500 mg | ORAL_TABLET | Freq: Every evening | ORAL | Status: DC | PRN
  Administered 2016-06-20 – 2016-06-23 (×4): 0.5 mg via ORAL
  Filled 2016-06-19 (×4): qty 2

## 2016-06-19 MED ORDER — ZOLPIDEM TARTRATE 5 MG PO TABS: 5.0000 mg | ORAL_TABLET | Freq: Every evening | ORAL | Status: DC | PRN

## 2016-06-19 MED ORDER — PANTOPRAZOLE SODIUM 40 MG PO TBEC
40.0000 mg | DELAYED_RELEASE_TABLET | Freq: Every day | ORAL | Status: DC
  Administered 2016-06-19 – 2016-06-24 (×6): 40 mg via ORAL
  Filled 2016-06-19 (×7): qty 1

## 2016-06-19 MED ORDER — ASPIRIN EC 81 MG PO TBEC
81.0000 mg | DELAYED_RELEASE_TABLET | Freq: Every day | ORAL | Status: DC
  Administered 2016-06-19 – 2016-06-24 (×6): 81 mg via ORAL
  Filled 2016-06-19 (×6): qty 1

## 2016-06-19 MED ORDER — PROTAMINE SULFATE 10 MG/ML IV SOLN
INTRAVENOUS | Status: AC
  Filled 2016-06-19: qty 5

## 2016-06-19 MED ORDER — HYDROMORPHONE HCL 1 MG/ML IJ SOLN
0.2500 mg | INTRAMUSCULAR | Status: DC | PRN
  Administered 2016-06-19 (×4): 0.5 mg via INTRAVENOUS

## 2016-06-19 MED ORDER — HYDRALAZINE HCL 20 MG/ML IJ SOLN: 5.0000 mg | INTRAMUSCULAR | Status: DC | PRN

## 2016-06-19 MED ORDER — IRBESARTAN 150 MG PO TABS
150.0000 mg | ORAL_TABLET | Freq: Every day | ORAL | Status: DC
  Administered 2016-06-19 – 2016-06-23 (×5): 150 mg via ORAL
  Filled 2016-06-19 (×6): qty 1

## 2016-06-19 MED ORDER — ROSUVASTATIN CALCIUM 10 MG PO TABS
40.0000 mg | ORAL_TABLET | Freq: Every day | ORAL | Status: DC
  Administered 2016-06-19 – 2016-06-24 (×6): 40 mg via ORAL
  Filled 2016-06-19: qty 4
  Filled 2016-06-19: qty 1
  Filled 2016-06-19: qty 4
  Filled 2016-06-19: qty 1
  Filled 2016-06-19 (×3): qty 4

## 2016-06-19 MED ORDER — CEFUROXIME SODIUM 1.5 G IJ SOLR
1.5000 g | Freq: Two times a day (BID) | INTRAMUSCULAR | Status: AC
  Administered 2016-06-19 – 2016-06-20 (×2): 1.5 g via INTRAVENOUS
  Filled 2016-06-19 (×2): qty 1.5

## 2016-06-19 MED ORDER — HEPARIN SODIUM (PORCINE) 1000 UNIT/ML IJ SOLN
INTRAMUSCULAR | Status: DC | PRN
  Administered 2016-06-19: 7000 [IU] via INTRAVENOUS

## 2016-06-19 MED ORDER — DOCUSATE SODIUM 100 MG PO CAPS
100.0000 mg | ORAL_CAPSULE | Freq: Every day | ORAL | Status: DC
  Administered 2016-06-20 – 2016-06-24 (×4): 100 mg via ORAL
  Filled 2016-06-19 (×6): qty 1

## 2016-06-19 MED ORDER — SODIUM CHLORIDE 0.9 % IV SOLN: 500.0000 mL | Freq: Once | INTRAVENOUS | Status: DC | PRN

## 2016-06-19 MED ORDER — INSULIN ASPART 100 UNIT/ML ~~LOC~~ SOLN
0.0000 [IU] | Freq: Three times a day (TID) | SUBCUTANEOUS | Status: DC
  Administered 2016-06-19: 3 [IU] via SUBCUTANEOUS
  Administered 2016-06-20 (×2): 2 [IU] via SUBCUTANEOUS
  Administered 2016-06-21 (×2): 3 [IU] via SUBCUTANEOUS
  Administered 2016-06-21: 2 [IU] via SUBCUTANEOUS
  Administered 2016-06-21: 3 [IU] via SUBCUTANEOUS
  Administered 2016-06-22: 2 [IU] via SUBCUTANEOUS
  Administered 2016-06-22 – 2016-06-23 (×2): 3 [IU] via SUBCUTANEOUS
  Administered 2016-06-23 (×2): 2 [IU] via SUBCUTANEOUS

## 2016-06-19 MED ORDER — ROCURONIUM BROMIDE 100 MG/10ML IV SOLN
INTRAVENOUS | Status: DC | PRN
  Administered 2016-06-19: 50 mg via INTRAVENOUS

## 2016-06-19 MED ORDER — AZELASTINE HCL 0.15 % NA SOLN
2.0000 | Freq: Two times a day (BID) | NASAL | Status: DC | PRN
  Filled 2016-06-19: qty 30

## 2016-06-19 MED ORDER — MEPERIDINE HCL 25 MG/ML IJ SOLN: 6.2500 mg | INTRAMUSCULAR | Status: DC | PRN

## 2016-06-19 MED ORDER — NITROGLYCERIN 0.4 MG SL SUBL: 0.4000 mg | SUBLINGUAL_TABLET | SUBLINGUAL | Status: DC | PRN

## 2016-06-19 MED ORDER — FENOFIBRIC ACID 105 MG PO TABS: 105.0000 mg | ORAL_TABLET | Freq: Every day | ORAL | Status: DC

## 2016-06-19 MED ORDER — OLMESARTAN MEDOXOMIL-HCTZ 20-12.5 MG PO TABS: 1.0000 | ORAL_TABLET | Freq: Every day | ORAL | Status: DC

## 2016-06-19 MED ORDER — MORPHINE SULFATE (PF) 2 MG/ML IV SOLN
2.0000 mg | INTRAVENOUS | Status: DC | PRN
  Administered 2016-06-19 – 2016-06-20 (×2): 2 mg via INTRAVENOUS
  Filled 2016-06-19 (×2): qty 1

## 2016-06-19 MED ORDER — PROPOFOL 10 MG/ML IV BOLUS
INTRAVENOUS | Status: AC
  Filled 2016-06-19: qty 20

## 2016-06-19 MED ORDER — 0.9 % SODIUM CHLORIDE (POUR BTL) OPTIME
TOPICAL | Status: DC | PRN
Start: 2016-06-19 — End: 2016-06-19
  Administered 2016-06-19: 2000 mL

## 2016-06-19 MED ORDER — SUGAMMADEX SODIUM 200 MG/2ML IV SOLN
INTRAVENOUS | Status: AC
  Filled 2016-06-19: qty 2

## 2016-06-19 MED ORDER — LIDOCAINE 2% (20 MG/ML) 5 ML SYRINGE
INTRAMUSCULAR | Status: AC
  Filled 2016-06-19: qty 5

## 2016-06-19 MED ORDER — PANTOPRAZOLE SODIUM 40 MG PO TBEC
40.0000 mg | DELAYED_RELEASE_TABLET | Freq: Once | ORAL | Status: AC
  Administered 2016-06-19: 40 mg via ORAL
  Filled 2016-06-19: qty 1

## 2016-06-19 MED ORDER — METOPROLOL TARTRATE 5 MG/5ML IV SOLN: 2.0000 mg | INTRAVENOUS | Status: DC | PRN

## 2016-06-19 MED ORDER — PROTAMINE SULFATE 10 MG/ML IV SOLN
INTRAVENOUS | Status: DC | PRN
  Administered 2016-06-19: 20 mg via INTRAVENOUS
  Administered 2016-06-19 (×3): 10 mg via INTRAVENOUS

## 2016-06-19 MED ORDER — LABETALOL HCL 5 MG/ML IV SOLN
INTRAVENOUS | Status: AC
  Filled 2016-06-19: qty 4

## 2016-06-19 MED ORDER — MUPIROCIN CALCIUM 2 % EX CREA
1.0000 "application " | TOPICAL_CREAM | Freq: Two times a day (BID) | CUTANEOUS | Status: DC | PRN
  Filled 2016-06-19: qty 15

## 2016-06-19 MED ORDER — HEPARIN SODIUM (PORCINE) 5000 UNIT/ML IJ SOLN
5000.0000 [IU] | Freq: Three times a day (TID) | INTRAMUSCULAR | Status: DC
  Administered 2016-06-20 – 2016-06-23 (×10): 5000 [IU] via SUBCUTANEOUS
  Filled 2016-06-19 (×10): qty 1

## 2016-06-19 MED ORDER — ACETAMINOPHEN 325 MG PO TABS
325.0000 mg | ORAL_TABLET | ORAL | Status: DC | PRN
  Administered 2016-06-20 – 2016-06-24 (×9): 650 mg via ORAL
  Filled 2016-06-19 (×9): qty 2

## 2016-06-19 MED ORDER — PHENYLEPHRINE 40 MCG/ML (10ML) SYRINGE FOR IV PUSH (FOR BLOOD PRESSURE SUPPORT)
PREFILLED_SYRINGE | INTRAVENOUS | Status: AC
  Filled 2016-06-19: qty 10

## 2016-06-19 MED ORDER — PROPOFOL 10 MG/ML IV BOLUS
INTRAVENOUS | Status: DC | PRN
  Administered 2016-06-19: 30 mg via INTRAVENOUS
  Administered 2016-06-19: 50 mg via INTRAVENOUS
  Administered 2016-06-19: 120 mg via INTRAVENOUS

## 2016-06-19 MED ORDER — FAMOTIDINE 20 MG PO TABS: 20.0000 mg | ORAL_TABLET | Freq: Two times a day (BID) | ORAL | Status: DC | PRN

## 2016-06-19 MED ORDER — OXYCODONE-ACETAMINOPHEN 5-325 MG PO TABS
1.0000 | ORAL_TABLET | ORAL | Status: DC | PRN
  Filled 2016-06-19: qty 2

## 2016-06-19 MED ORDER — DEXTROSE 5 % IV SOLN
1.5000 g | INTRAVENOUS | Status: AC
  Administered 2016-06-19: 1.5 g via INTRAVENOUS

## 2016-06-19 MED ORDER — POLYETHYLENE GLYCOL 3350 17 G PO PACK: 17.0000 g | PACK | Freq: Every day | ORAL | Status: DC | PRN

## 2016-06-19 MED ORDER — HYDROMORPHONE HCL 1 MG/ML IJ SOLN
0.5000 mg | INTRAMUSCULAR | Status: AC | PRN
  Administered 2016-06-19 (×2): 0.5 mg via INTRAVENOUS

## 2016-06-19 MED ORDER — HEMOSTATIC AGENTS (NO CHARGE) OPTIME
TOPICAL | Status: DC | PRN
  Administered 2016-06-19: 1 via TOPICAL

## 2016-06-19 MED ORDER — NITROGLYCERIN 0.4 MG/HR TD PT24: 0.4000 mg | MEDICATED_PATCH | TRANSDERMAL | Status: DC | PRN

## 2016-06-19 MED ORDER — ALBUTEROL SULFATE (2.5 MG/3ML) 0.083% IN NEBU: 3.0000 mL | INHALATION_SOLUTION | RESPIRATORY_TRACT | Status: DC | PRN

## 2016-06-19 MED ORDER — SODIUM CHLORIDE 0.9 % IV SOLN: INTRAVENOUS | Status: DC

## 2016-06-19 MED ORDER — MIDAZOLAM HCL 2 MG/2ML IJ SOLN
INTRAMUSCULAR | Status: AC
  Filled 2016-06-19: qty 2

## 2016-06-19 MED ORDER — ONDANSETRON HCL 4 MG/2ML IJ SOLN
INTRAMUSCULAR | Status: DC | PRN
  Administered 2016-06-19: 4 mg via INTRAVENOUS

## 2016-06-19 MED ORDER — LABETALOL HCL 5 MG/ML IV SOLN: 10.0000 mg | INTRAVENOUS | Status: DC | PRN

## 2016-06-19 MED ORDER — DIPHENHYDRAMINE HCL 25 MG PO CAPS
25.0000 mg | ORAL_CAPSULE | ORAL | Status: DC | PRN
  Administered 2016-06-19: 25 mg via ORAL
  Filled 2016-06-19: qty 1

## 2016-06-19 MED ORDER — CEFUROXIME SODIUM 1.5 G IJ SOLR
INTRAMUSCULAR | Status: AC
  Filled 2016-06-19: qty 1.5

## 2016-06-19 SURGICAL SUPPLY — 57 items
BANDAGE ESMARK 6X9 LF (GAUZE/BANDAGES/DRESSINGS) IMPLANT
BNDG ESMARK 6X9 LF (GAUZE/BANDAGES/DRESSINGS)
CANISTER SUCTION 2500CC (MISCELLANEOUS) ×2 IMPLANT
CANNULA VESSEL 3MM 2 BLNT TIP (CANNULA) ×4 IMPLANT
CLIP TI MEDIUM 24 (CLIP) ×2 IMPLANT
CLIP TI WIDE RED SMALL 24 (CLIP) ×2 IMPLANT
CUFF TOURNIQUET SINGLE 24IN (TOURNIQUET CUFF) IMPLANT
CUFF TOURNIQUET SINGLE 34IN LL (TOURNIQUET CUFF) ×2 IMPLANT
CUFF TOURNIQUET SINGLE 44IN (TOURNIQUET CUFF) IMPLANT
DERMABOND ADVANCED (GAUZE/BANDAGES/DRESSINGS) ×1
DERMABOND ADVANCED .7 DNX12 (GAUZE/BANDAGES/DRESSINGS) ×1 IMPLANT
DRAIN CHANNEL 15F RND FF W/TCR (WOUND CARE) IMPLANT
DRAPE PROXIMA HALF (DRAPES) IMPLANT
DRAPE X-RAY CASS 24X20 (DRAPES) IMPLANT
ELECT REM PT RETURN 9FT ADLT (ELECTROSURGICAL) ×2
ELECTRODE REM PT RTRN 9FT ADLT (ELECTROSURGICAL) ×1 IMPLANT
EVACUATOR SILICONE 100CC (DRAIN) IMPLANT
GLOVE BIO SURGEON STRL SZ 6.5 (GLOVE) ×12 IMPLANT
GLOVE BIOGEL PI IND STRL 6.5 (GLOVE) ×2 IMPLANT
GLOVE BIOGEL PI IND STRL 7.5 (GLOVE) ×1 IMPLANT
GLOVE BIOGEL PI INDICATOR 6.5 (GLOVE) ×2
GLOVE BIOGEL PI INDICATOR 7.5 (GLOVE) ×1
GLOVE ECLIPSE 6.5 STRL STRAW (GLOVE) ×4 IMPLANT
GLOVE SURG SS PI 7.5 STRL IVOR (GLOVE) ×2 IMPLANT
GOWN STRL REUS W/ TWL LRG LVL3 (GOWN DISPOSABLE) ×4 IMPLANT
GOWN STRL REUS W/ TWL XL LVL3 (GOWN DISPOSABLE) ×1 IMPLANT
GOWN STRL REUS W/TWL LRG LVL3 (GOWN DISPOSABLE) ×4
GOWN STRL REUS W/TWL XL LVL3 (GOWN DISPOSABLE) ×1
GRAFT PROPATEN W/RING 6X80X60 (Vascular Products) ×2 IMPLANT
HEMOSTAT SNOW SURGICEL 2X4 (HEMOSTASIS) ×2 IMPLANT
INSERT FOGARTY SM (MISCELLANEOUS) ×2 IMPLANT
KIT BASIN OR (CUSTOM PROCEDURE TRAY) ×2 IMPLANT
KIT ROOM TURNOVER OR (KITS) ×2 IMPLANT
MARKER GRAFT CORONARY BYPASS (MISCELLANEOUS) IMPLANT
NS IRRIG 1000ML POUR BTL (IV SOLUTION) ×4 IMPLANT
PACK PERIPHERAL VASCULAR (CUSTOM PROCEDURE TRAY) ×2 IMPLANT
PAD ARMBOARD 7.5X6 YLW CONV (MISCELLANEOUS) ×4 IMPLANT
SET COLLECT BLD 21X3/4 12 (NEEDLE) IMPLANT
STOPCOCK 4 WAY LG BORE MALE ST (IV SETS) IMPLANT
SUT ETHILON 3 0 PS 1 (SUTURE) IMPLANT
SUT GORETEX 6.0 TT13 (SUTURE) IMPLANT
SUT GORETEX 6.0 TT9 (SUTURE) IMPLANT
SUT PROLENE 5 0 C 1 24 (SUTURE) ×4 IMPLANT
SUT PROLENE 6 0 BV (SUTURE) ×6 IMPLANT
SUT PROLENE 7 0 BV 1 (SUTURE) IMPLANT
SUT SILK 2 0 SH (SUTURE) ×2 IMPLANT
SUT SILK 3 0 (SUTURE)
SUT SILK 3-0 18XBRD TIE 12 (SUTURE) IMPLANT
SUT VIC AB 2-0 CT1 27 (SUTURE) ×2
SUT VIC AB 2-0 CT1 TAPERPNT 27 (SUTURE) ×2 IMPLANT
SUT VIC AB 3-0 SH 27 (SUTURE) ×2
SUT VIC AB 3-0 SH 27X BRD (SUTURE) ×2 IMPLANT
SUT VICRYL 4-0 PS2 18IN ABS (SUTURE) ×4 IMPLANT
TRAY FOLEY W/METER SILVER 16FR (SET/KITS/TRAYS/PACK) ×2 IMPLANT
TUBING EXTENTION W/L.L. (IV SETS) IMPLANT
UNDERPAD 30X30 (UNDERPADS AND DIAPERS) ×2 IMPLANT
WATER STERILE IRR 1000ML POUR (IV SOLUTION) ×2 IMPLANT

## 2016-06-19 NOTE — Interval H&P Note (Signed)
History and Physical Interval Note:  06/19/2016 7:42 AM  [name]  has presented today for surgery, with the diagnosis of Peripheral vascular disease with left lower extremity claudication I70.212  The various methods of treatment have been discussed with the patient and family. After consideration of risks, benefits and other options for treatment, the patient has consented to  Procedure(s): BYPASS GRAFT FEMORAL-POPLITEAL ARTERY (Left) as a surgical intervention .  The patient's history has been reviewed, patient examined, no change in status, stable for surgery.  I have reviewed the patient's chart and labs.  Questions were answered to the patient's satisfaction.     Annamarie Major

## 2016-06-19 NOTE — H&P (View-Only) (Signed)
[facility] [place]  Patient name: [name] MRN: [id number] DOB: 06-10-1955 Sex: female   REASON FOR VISIT:    Follow up claudication  HISOTRY OF PRESENT ILLNESS:    [name] is a 61 y.o. female, who is evaluation of claudication, left leg greater than right.  The patient was initially seen and evaluated by Dr. Alvester Chou for treatment of critical limb ischemia.  She has undergone atherectomy and angioplasty of an occluded left superficial femoral artery as well as stenting.  The stents have now occluded.  When she initially presented, she was having a burning feeling in her feet as well as cramping in her calf with walking.  The symptoms resolved with stenting, but have now returned.  She states that she can walk approximately 100 feet.  Resting alleviates her symptoms.  She comes back today because of worsening pain in her legs.  She will occasionally wake up in the middle the night with pain in her feet.  She does not have any open wounds  The patient has a strong family history of cardiovascular disease, mainly cardiac in origin.  The patient has a history of stroke in the past with some mild left-sided symptoms.  She has a history of tobacco abuse, however recently stopped smoking.  She suffers and hypercholesterolemia which is managed with a statin.  She is medically managed for hypertension.   PAST MEDICAL HISTORY:   Past Medical History:  Diagnosis Date  . 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 Anemia   . Angioedema 07/10/2015  . Anxiety   . Arthritis    "neck, left hand" (09/14/2012)  . Asthma   . Cancer ([place]) 1985   ovarian, no treatment except surgery  . Complication of anesthesia    OCCASIONAL TROUBLE TURNING NECK TO RIGHT  . Critical lower limb ischemia    10/2014 s/p L SFA stenting  . Diverticulosis of colon with hemorrhage April 2013  . GERD (gastroesophageal reflux disease)   . H/O hiatal hernia   . Heart  attack    2003 mild MI, March 2013 mild MI  . Hidradenitis    groin  . History of blood transfusion 1985 AND 2013  . Hyperlipidemia   . Hypertension   . Irritable bowel syndrome   . Left-sided weakness    since stroke, left eye trouble seeing  . Migraines   . Mild CAD    a. Cath 09/2010: mild luminal irregularities of LAD, 30% prox RCA and 20-30% mRCA, EF 65%.  . Neuropathy ([place])   . Obesity   . PAD (peripheral artery disease) ([place])    a. critical limb ischemia s/p PTA/stenting of L SFA 10/2014. c. occ prior SFA stent by angio 01/2016, for possible PV bypass.  . Recurrent upper respiratory infection (URI)   . S/P arterial stent-mid Lt SFA 11/03/14 11/04/2014  . Schatzki's ring   . Sinus problem   . Stomach ulcer 1972   non-bleeding  . Stroke ([place]) 07-2007, 07-2008, 07-2009   total 3 strokes; mild left sided weakness and left eye "jumps".  . Type II diabetes mellitus ([place])   . Vitamin B 12 deficiency 07-15-2013     FAMILY HISTORY:   Family History  Problem Relation Age of Onset  . Breast cancer Mother   . Heart disease Mother   . Hypertension Mother   . Diabetes Father   . Heart disease Father   . Stroke Father   . Heart disease Sister   .  Hypertension Sister   . Hypertension Sister   . Hyperlipidemia Sister   . Diabetes Sister   . Allergic rhinitis Sister   . Emphysema      great uncle  . Aneurysm Sister     brain  . Angioedema Neg Hx   . Asthma Neg Hx   . Eczema Neg Hx   . Immunodeficiency Neg Hx   . Urticaria Neg Hx     SOCIAL HISTORY:   Social History  Substance Use Topics  . Smoking status: Former Smoker    Packs/day: 0.33    Years: 41.00    Types: Cigarettes    Quit date: 01/15/2016  . Smokeless tobacco: Never Used     Comment: Getting ready to start nicotine patches RX by Dr. Gwenlyn Found per pt.  . Alcohol use No     ALLERGIES:   Allergies  Allergen Reactions  . Bactrim [Sulfamethoxazole-Trimethoprim] Shortness Of Breath  . Sulfa Antibiotics  Shortness Of Breath and Palpitations  . 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 Fish Allergy Hives and Swelling    Tongue swelling  . Iodine Swelling  . Metformin And Related Diarrhea  . Adhesive [Tape] Rash    Paper tape is ok  . Shellfish Allergy Swelling and Rash    Tongue swelling     CURRENT MEDICATIONS:   Current Outpatient Prescriptions  Medication Sig Dispense Refill  . acetaminophen (TYLENOL) 325 MG tablet Take 2 tablets (650 mg total) by mouth every 4 (four) hours as needed for headache or mild pain.    Marland Kitchen albuterol (PROAIR HFA) 108 (90 Base) MCG/ACT inhaler 1-2 inhalations every 4-6 hours as needed for cough or wheeze. 1 Inhaler 1  . ALPRAZolam (XANAX) 0.5 MG tablet Take 0.25-0.5 mg by mouth at bedtime as needed for anxiety or sleep.     Marland Kitchen aspirin EC 81 MG tablet Take 81 mg by mouth daily.    . Azelastine HCl 0.15 % SOLN Place 2 sprays into both nostrils 2 (two) times daily. 30 mL 5  . b complex vitamins tablet Take 1 tablet by mouth daily.    . Calcium Carb-Cholecalciferol (CALCIUM 600 + D PO) Take 1 tablet by mouth daily.    . cephALEXin (KEFLEX) 250 MG capsule Take 250 mg by mouth 3 (three) times daily.    . citalopram (CELEXA) 10 MG tablet Take 10 mg by mouth daily.    . clopidogrel (PLAVIX) 75 MG tablet Take 75 mg by mouth at bedtime.     . dicyclomine (BENTYL) 20 MG tablet Take 1 tablet (20 mg total) by mouth 3 (three) times daily before meals. 90 tablet 1  . doxycycline (DORYX) 100 MG EC tablet Take 100 mg by mouth daily.    Marland Kitchen EPINEPHrine 0.3 mg/0.3 mL IJ SOAJ injection Inject 0.3 mLs (0.3 mg total) into the muscle once. 1 Device 1  . Fenofibric Acid 105 MG TABS Take by mouth.    Marland Kitchen ibuprofen (ADVIL,MOTRIN) 800 MG tablet Take 800 mg by mouth every 8 (eight) hours as needed.    . insulin aspart (NOVOLOG) 100 UNIT/ML injection Inject 5-12 Units into the skin 3 (three) times daily before meals. Per sliding scale Patient not  currently using Novolog using Humalog.  Patient switches back and forth.    . Insulin Glargine (TOUJEO SOLOSTAR) 300 UNIT/ML SOPN Inject 30 Units into the skin every morning.     . insulin lispro (HUMALOG) 100 UNIT/ML injection  Inject 5-12 Units into the skin as directed. 5 units before lunch and dinner. > 200 8 units, > 250 10 units, > 300 12 units     . loratadine (CLARITIN) 10 MG tablet Take 10 mg by mouth daily as needed for allergies.    Marland Kitchen meclizine (ANTIVERT) 25 MG tablet Take 25 mg by mouth 3 (three) times daily as needed for dizziness.    . mometasone (NASONEX) 50 MCG/ACT nasal spray 1-2 sprays in each nostril once daily for stuffy nose or drainage. (Patient taking differently: Place 2 sprays into the nose daily as needed (nasal congestion). ) 17 g 5  . mupirocin cream (BACTROBAN) 2 % Apply 1 application topically 2 (two) times daily as needed (wound care (boils)).     . nitroGLYCERIN (NITRODUR - DOSED IN MG/24 HR) 0.4 mg/hr Place 0.4 mg onto the skin as needed (chest pain). Reported on 07/10/2015    . nitroGLYCERIN (NITROSTAT) 0.4 MG SL tablet Place 1 tablet (0.4 mg total) under the tongue every 5 (five) minutes as needed for chest pain. Maximum 3 doses 25 tablet 12  . Olmesartan-Amlodipine-HCTZ (TRIBENZOR) 20-5-12.5 MG TABS Take 1 tablet by mouth daily.    . ONE TOUCH ULTRA TEST test strip     . pantoprazole (PROTONIX) 40 MG tablet TAKE 1 TABLET (40 MG TOTAL) BY MOUTH DAILY. 90 tablet 2  . potassium chloride SA (K-DUR,KLOR-CON) 20 MEQ tablet Take 20 mEq by mouth daily as needed (for cramps).    . ranitidine (ZANTAC) 150 MG tablet Use one tablet one to two times daily as directed. (Patient taking differently: Take 150 mg by mouth 2 (two) times daily as needed for heartburn. Use one tablet one to two times daily as directed.) 60 tablet 5  . rosuvastatin (CRESTOR) 40 MG tablet Take 40 mg by mouth daily.    . SUMAtriptan (IMITREX) 50 MG tablet Take 50 mg by mouth every 2 (two) hours as needed for  migraine.    . TRULICITY [id number] SOPN Inject 0.75 mg into the skin every 7 (seven) days. On Thursdays    . zolpidem (AMBIEN) 10 MG tablet Take 5 mg by mouth at bedtime as needed for sleep.     No current facility-administered medications for this visit.     REVIEW OF SYSTEMS:   [X]  denotes positive finding, [ ]  denotes negative finding Cardiac  Comments:  Chest pain or chest pressure: x   Shortness of breath upon exertion: x   Short of breath when lying flat:    Irregular heart rhythm:        Vascular    Pain in calf, thigh, or hip brought on by ambulation: x   Pain in feet at night that wakes you up from your sleep:  x   Blood clot in your veins:    Leg swelling:  x       Pulmonary    Oxygen at home:    Productive cough:     Wheezing:         Neurologic    Sudden weakness in arms or legs:     Sudden numbness in arms or legs:  x   Sudden onset of difficulty speaking or slurred speech:    Temporary loss of vision in one eye:     Problems with dizziness:         Gastrointestinal    Blood in stool:     Vomited blood:         Genitourinary  Burning when urinating:     Blood in urine:        Psychiatric    Major depression:         Hematologic    Bleeding problems:    Problems with blood clotting too easily:        Skin    Rashes or ulcers:        Constitutional    Fever or chills:      PHYSICAL EXAM:   Vitals:   06/10/16 1300  BP: 131/77  Pulse: 93  Resp: 16  Temp: 98.2 F (36.8 C)  TempSrc: Oral  SpO2: 98%  Weight: 145 lb (65.8 kg)  Height: 5\' 4"  (1.626 m)    GENERAL: The patient is a well-nourished female, in no acute distress. The vital signs are documented above. CARDIAC: There is a regular rate and rhythm.  VASCULAR: Nonpalpable pedal pulses PULMONARY: Non-labored respirations vein mapping shows a small left saphenous vein.   MUSCULOSKELETAL: There are no major deformities or cyanosis. NEUROLOGIC: No focal weakness or paresthesias  are detected. SKIN: There are no ulcers or rashes noted. PSYCHIATRIC: The patient has a normal affect.  STUDIES:   vein mapping shows a small left saphenous vein.    MEDICAL ISSUES:   Left leg claudication: I tried to persuade the patient to continue with medical management as opposed to proceeding with femoral-popliteal bypass grafting.  She is adamant that she wants to have the surgery done.  I told her that this would need to be done with Gore-Tex as her saphenous vein is not adequate on the left side.  I discussed the potential complications and discussed the long-term patency of the bypass graft.  She understands all this and wants to get this done as soon as possible.  I will stop her Plavix and schedule her for Wednesday, February 14 for a left femoral to above-knee popliteal bypass graft with Gore-Tex.    Annamarie Major, MD [facility] [phone] Pager [phone]

## 2016-06-19 NOTE — Transfer of Care (Signed)
Immediate Anesthesia Transfer of Care Note  Patient: [name]  Procedure(s) Performed: Procedure(s): Left Leg BYPASS GRAFT FEMORAL-POPLITEAL ARTERY (Left)  Patient Location: PACU  Anesthesia Type:General  Level of Consciousness: awake, oriented and patient cooperative  Airway & Oxygen Therapy: Patient Spontanous Breathing and Patient connected to nasal cannula oxygen  Post-op Assessment: Report given to RN and Post -op Vital signs reviewed and stable  Post vital signs: Reviewed  Last Vitals:  Vitals:   06/19/16 0654 06/19/16 1135  BP: 122/70   Pulse: 91   Resp: 18   Temp: 36.9 C 36.6 C    Last Pain:  Vitals:   06/19/16 0724  TempSrc:   PainSc: 4          Complications: No apparent anesthesia complications

## 2016-06-19 NOTE — Op Note (Signed)
    Patient name: [name] MRN: [id number] DOB: [birth year] Sex: female  06/19/2016 Pre-operative Diagnosis: Claudication, left leg Post-operative diagnosis:  Same Surgeon:  Annamarie Major Assistants:  Gerri Lins Procedure:   Left femoral to above-knee popliteal artery bypass graft with 6 mm external ring PTFE Anesthesia:  Gen. Blood Loss:  See anesthesia record Specimens:  None  Findings:  Healthy-appearing above-knee popliteal artery  Indications:  This is a 61 year old female who has previously undergone percutaneous interventions by Dr. Gwenlyn Found which ultimately occluded.  She states she cannot tolerate her level of inactivity.  We attempted attempts at nonoperative management, however she could not tolerate this and therefore we elected to proceed with above-knee bypass.  She does not have an adequate saphenous vein.  Procedure:  The patient was identified in the holding area and taken to [facility]  The patient was then placed supine on the table. general anesthesia was administered.  The patient was prepped and draped in the usual sterile fashion.  A time out was called and antibiotics were administered.  An oblique incision was made in the left groin.  Cautery was used to divide subcutaneous tissue down to the femoral sheath which was opened sharply.  Identified the common femoral artery and exposed it from the inguinal ligament down to the bifurcation.  It was soft anteriorly with a hard posterior plaque.  Next, a medial incision was made just above the patella.  Cautery was used to divide subcutaneous tissue down to the fascia which was opened with cautery.  The popliteal space was entered and the popliteal artery was identified.  This was a soft-appearing artery without significant calcification.  Once adequate exposure was obtained, I subsartorial tunnel was created with a long Gore tunneler.  At this point the patient was fully heparinized.  After the heparin circulated the  common femoral artery was occluded with vascular clamps.  A #11 blade to make an arteriotomy in the distal common femoral artery which was extended longitudinally with Potts scissors.  The graft was beveled to fit the size of the arteriotomy and a running anastomosis was created with 6-0 Prolene.  Prior to completion the appropriate flushing maneuvers were performed and the anastomosis was then completed.  The graft was brought to the previously created tunnel making sure to maintain proper orientation.  A tourniquet was placed on the upper thigh.  The leg was exsanguinated with an Esmarch.  Tourniquet was taken to 50 mm of pressure.  After ensuring that the graft was about redundancy, it was cut to the appropriate length with the leg straightened.  A #11 blade used to make an arteriotomy which was extended longitudinally with Potts scissors.  The graft was beveled to fit the size of the arteriotomy and a running anastomosis was completed with 6-0 Prolene.  Prior to completion the tourniquet was let down and the graft and artery were appropriately flushed.  The anastomosis was completed.  The patient had graft dependent signals in the anterior tibial and posterior tibial artery at the ankle.  50 minutes protamine was given.  Once hemostasis was satisfactory the incisions were closed with multiple layers of Vicryl followed by 4 Vicryl skin and Dermabond.  The patient was successfully estimated taken the recovery room in stable condition.  There were no immediate Basis.   Disposition:  To PACU in stable condition.   Theotis Burrow, M.D. [facility] [place] Office: [phone] Pager:  [phone]

## 2016-06-19 NOTE — Anesthesia Procedure Notes (Signed)
Procedure Name: Intubation Date/Time: 06/19/2016 8:48 AM Performed by: Jenne Campus Pre-anesthesia Checklist: Patient identified, Emergency Drugs available, Suction available and Patient being monitored Patient Re-evaluated:Patient Re-evaluated prior to inductionOxygen Delivery Method: Circle System Utilized Preoxygenation: Pre-oxygenation with 100% oxygen Intubation Type: IV induction Ventilation: Mask ventilation without difficulty Laryngoscope Size: Miller and 3 Grade View: Grade I Tube type: Oral Tube size: 7.0 mm Number of attempts: 1 Airway Equipment and Method: Stylet and Oral airway Placement Confirmation: ETT inserted through vocal cords under direct vision,  positive ETCO2 and breath sounds checked- equal and bilateral Secured at: 21 cm Tube secured with: Tape Dental Injury: Teeth and Oropharynx as per pre-operative assessment

## 2016-06-19 NOTE — Care Management Note (Addendum)
Case Management Note  Patient Details  Name: [name] MRN: [id number] Date of Birth: June 29, 1955  Subjective/Objective:    S/p fem pop bypass, from home alone, NCM will cont to follow for dc needs. Await pt/ot eval. Per pt/ot eval rec HHPT/HHOT and rolling walker.  Patient lives alone, she has a [employer], and she has medication coverage and transportation at [employer].  NCM left [facility] with patient, awaiting pt eval,. Patient chose [facility], referral made to [facility] for HHPT/OT, Soc will begin 24-48 hrs post discharge.  NCM made referral to [facility] for rolling walker. Will need order for DME.               Action/Plan:   Expected Discharge Date:                  Expected Discharge Plan:  [place]  In-House Referral:     Discharge planning Services  CM Consult  Post Acute Care Choice:    Choice offered to:     DME Arranged:   rolling walker DME Agency:   [facility]  HH Arranged:   HHPT, [place] Agency:   [facility]  Status of Service:  In process, will continue to follow  If discussed at Long Length of Stay Meetings, dates discussed:    Additional Comments:  Zenon Mayo, RN 06/19/2016, 4:14 PM

## 2016-06-19 NOTE — Anesthesia Postprocedure Evaluation (Signed)
Anesthesia Post Note  Patient: [name]  Procedure(s) Performed: Procedure(s) (LRB): Left Leg BYPASS GRAFT FEMORAL-POPLITEAL ARTERY (Left)  Patient location during evaluation: PACU Anesthesia Type: General Level of consciousness: awake and alert and oriented Pain management: pain level controlled Vital Signs Assessment: post-procedure vital signs reviewed and stable Respiratory status: spontaneous breathing, nonlabored ventilation, respiratory function stable and patient connected to nasal cannula oxygen Cardiovascular status: blood pressure returned to baseline and stable Postop Assessment: no signs of nausea or vomiting Anesthetic complications: no       Last Vitals:  Vitals:   06/19/16 1154 06/19/16 1205  BP: 133/78 132/76  Pulse: 82 74  Resp: 13 10  Temp:      Last Pain:  Vitals:   06/19/16 1205  TempSrc:   PainSc: 10-Worst pain ever                 Johntay Doolen A.

## 2016-06-20 ENCOUNTER — Encounter (HOSPITAL_COMMUNITY): Payer: Medicare Other

## 2016-06-20 ENCOUNTER — Encounter (HOSPITAL_COMMUNITY): Payer: Self-pay | Admitting: Surgery

## 2016-06-20 LAB — BASIC METABOLIC PANEL
Anion gap: 8 (ref 5–15)
BUN: 9 mg/dL (ref 6–20)
CO2: 27 mmol/L (ref 22–32)
Calcium: 9 mg/dL (ref 8.9–10.3)
Chloride: 105 mmol/L (ref 101–111)
Creatinine, Ser: 0.84 mg/dL (ref 0.44–1.00)
GFR calc Af Amer: >60 mL/min (ref >60)
GFR calc non Af Amer: >60 mL/min (ref >60)
Glucose, Bld: 140 mg/dL — ABNORMAL HIGH (ref 65–99)
Potassium: 3.7 mmol/L (ref 3.5–5.1)
Sodium: 140 mmol/L (ref 135–145)

## 2016-06-20 LAB — CBC
HCT: 30.7 % — ABNORMAL LOW (ref 36.0–46.0)
Hemoglobin: 9.9 g/dL — ABNORMAL LOW (ref 12.0–15.0)
MCH: 26.5 pg (ref 26.0–34.0)
MCHC: 32.2 g/dL (ref 30.0–36.0)
MCV: 82.3 fL (ref 78.0–100.0)
Platelets: 182 10*3/uL (ref 150–400)
RBC: 3.73 MIL/uL — ABNORMAL LOW (ref 3.87–5.11)
RDW: 12.9 % (ref 11.5–15.5)
WBC: 6.3 10*3/uL (ref 4.0–10.5)

## 2016-06-20 LAB — HEMOGLOBIN A1C
Hgb A1c MFr Bld: 7.3 % — ABNORMAL HIGH (ref 4.8–5.6)
Mean Plasma Glucose: 163 mg/dL

## 2016-06-20 LAB — GLUCOSE, CAPILLARY
Glucose-Capillary: 145 mg/dL — ABNORMAL HIGH (ref 65–99)
Glucose-Capillary: 156 mg/dL — ABNORMAL HIGH (ref 65–99)
Glucose-Capillary: 139 mg/dL — ABNORMAL HIGH (ref 65–99)
Glucose-Capillary: 223 mg/dL — ABNORMAL HIGH (ref 65–99)

## 2016-06-20 MED ORDER — AZELASTINE HCL 0.1 % NA SOLN
2.0000 | Freq: Two times a day (BID) | NASAL | Status: DC | PRN
  Administered 2016-06-20 – 2016-06-21 (×2): 2 via NASAL
  Filled 2016-06-20: qty 30

## 2016-06-20 NOTE — Evaluation (Signed)
Occupational Therapy Evaluation Patient Details Name: [name] MRN: [id number] DOB: [birth year] Today's Date: 06/20/2016    History of Present Illness Pt is a 61 y/o female s/p L fem-pop bypass. PMH: asthma, HTN, CAD, PVD, CVA, headaches, DM, depression, and arthritis.    Clinical Impression   Pt was independent prior to admission. Presents with L LE pain, but pt more concerned about intermittent nausea. Pt requiring min to min guard assist for ADL and mobility. Pt is not able to access her L foot for bathing and dressing. Will follow acutely and educate in ADL with AE and address standing ADL.     Follow Up Recommendations  Home health OT    Equipment Recommendations  None recommended by OT    Recommendations for Other Services       Precautions / Restrictions Precautions Precautions: Fall Restrictions Weight Bearing Restrictions: No      Mobility Bed Mobility Overal bed mobility: Needs Assistance Bed Mobility: Supine to Sit     Supine to sit: Min assist     General bed mobility comments: assist for L LE, increased time, heavy reliance on rail  Transfers Overall transfer level: Needs assistance Equipment used: Rolling walker (2 wheeled) Transfers: Sit to/from Stand Sit to Stand: Min guard         General transfer comment: cues for hand placement, min guard for safety    Balance Overall balance assessment: Needs assistance   Sitting balance-Leahy Scale: Good Sitting balance - Comments: maintains L LE off of floor due to pain     Standing balance-Leahy Scale: Fair Standing balance comment: Pt is able to release walker in static standing.                            ADL Overall ADL's : Needs assistance/impaired Eating/Feeding: Independent;Sitting   Grooming: Wash/dry hands;Sitting;Set up   Upper Body Bathing: Set up;Sitting   Lower Body Bathing: Minimal assistance;Sit to/from stand   Upper Body Dressing : Set up;Sitting    Lower Body Dressing: Minimal assistance;Sit to/from stand   Toilet Transfer: Min guard;Ambulation;RW;BSC (over toilet)   Toileting- Clothing Manipulation and Hygiene: Minimal assistance;Sit to/from stand       Functional mobility during ADLs: Min guard;Rolling walker;Cueing for safety       Vision     Perception     Praxis      Pertinent Vitals/Pain Pain Assessment: Faces Faces Pain Scale: Hurts little more Pain Location: L LE Pain Descriptors / Indicators: Grimacing;Guarding;Moaning Pain Intervention(s): Monitored during session;Premedicated before session;Repositioned     Hand Dominance Right   Extremity/Trunk Assessment Upper Extremity Assessment Upper Extremity Assessment: Overall WFL for tasks assessed   Lower Extremity Assessment Lower Extremity Assessment: Defer to PT evaluation       Communication Communication Communication: No difficulties   Cognition Arousal/Alertness: Awake/alert Behavior During Therapy: WFL for tasks assessed/performed Overall Cognitive Status: Within Functional Limits for tasks assessed                     General Comments       Exercises       Shoulder Instructions      Home Living Family/patient expects to be discharged to:: Private residence Living Arrangements: Alone Available Help at Discharge: Family;Friend(s);Available PRN/intermittently Type of Home: Apartment Home Access: Level entry     Home Layout: One level     Bathroom Shower/Tub: Teacher, early years/pre: Handicapped  height     Home Equipment: Cane - quad;Bedside commode          Prior Functioning/Environment Level of Independence: Independent        Comments: drives        OT Problem List: Decreased activity tolerance;Impaired balance (sitting and/or standing);Decreased knowledge of use of DME or AE;Pain   OT Treatment/Interventions: Self-care/ADL training;DME and/or AE instruction;Balance training;Patient/family  education;Therapeutic activities    OT Goals(Current goals can be found in the care plan section) Acute Rehab OT Goals Patient Stated Goal: return to PLOF OT Goal Formulation: With patient Time For Goal Achievement: 06/27/16 Potential to Achieve Goals: Good ADL Goals Pt Will Perform Grooming: with modified independence;standing Pt Will Perform Lower Body Bathing: with modified independence;sit to/from stand;with adaptive equipment Pt Will Perform Lower Body Dressing: with modified independence;sit to/from stand;with adaptive equipment Pt Will Transfer to Toilet: with modified independence;ambulating;bedside commode (over toilet) Pt Will Perform Toileting - Clothing Manipulation and hygiene: with modified independence;sit to/from stand  OT Frequency: Min 2X/week   Barriers to D/C:            Co-evaluation PT/OT/SLP Co-Evaluation/Treatment: Yes Reason for Co-Treatment: For patient/therapist safety PT goals addressed during session: Mobility/safety with mobility;Balance;Proper use of DME OT goals addressed during session: ADL's and self-care      End of Session Equipment Utilized During Treatment: Gait belt;Rolling walker Nurse Communication: Mobility status  Activity Tolerance: Patient tolerated treatment well Patient left: in chair;with call bell/phone within reach   Time: [id number] OT Time Calculation (min): 32 min Charges:  OT General Charges $OT Visit: 1 Procedure OT Evaluation $OT Eval Moderate Complexity: 1 Procedure G-Codes:    Malka So 06/20/2016, 11:33 AM [phone]

## 2016-06-20 NOTE — Progress Notes (Signed)
    Subjective  - POD #1  Nauseated Already walked 2 times   Physical Exam:  Brisk DP/PT/pero signals incisions       Assessment/Plan:  POD #1  Transfer to [facility] 06/20/2016 11:31 AM --  Vitals:   06/20/16 0735 06/20/16 0800  BP: 137/70 120/68  Pulse: 91 95  Resp: 16 12  Temp: 99.1 F (37.3 C)     Intake/Output Summary (Last 24 hours) at 06/20/16 1131 Last data filed at 06/20/16 0806  Gross per 24 hour  Intake           1877.5 ml  Output             1500 ml  Net            377.5 ml     Laboratory CBC    Component Value Date/Time   WBC 6.3 06/20/2016 0600   HGB 9.9 (L) 06/20/2016 0600   HCT 30.7 (L) 06/20/2016 0600   PLT 182 06/20/2016 0600    BMET    Component Value Date/Time   NA 140 06/20/2016 0600   K 3.7 06/20/2016 0600   CL 105 06/20/2016 0600   CO2 27 06/20/2016 0600   GLUCOSE 140 (H) 06/20/2016 0600   BUN 9 06/20/2016 0600   CREATININE 0.84 06/20/2016 0600   CREATININE 0.86 01/18/2016 1428   CALCIUM 9.0 06/20/2016 0600   GFRNONAA >60 06/20/2016 0600   GFRAA >60 06/20/2016 0600    COAG Lab Results  Component Value Date   INR 1.03 06/17/2016   INR 1.0 01/18/2016   INR 1.01 11/02/2014   No results found for: PTT  Antibiotics Anti-infectives    Start     Dose/Rate Route Frequency Ordered Stop   06/19/16 2000  cefUROXime (ZINACEF) 1.5 g in dextrose 5 % 50 mL IVPB     1.5 g 100 mL/hr over 30 Minutes Intravenous Every 12 hours 06/19/16 1357 06/20/16 0836   06/19/16 1400  doxycycline (VIBRA-TABS) tablet 100 mg     100 mg Oral Daily 06/19/16 1357     06/19/16 0701  cefUROXime (ZINACEF) 1.5 g in dextrose 5 % 50 mL IVPB     1.5 g 100 mL/hr over 30 Minutes Intravenous 30 min pre-op 06/19/16 0701 06/19/16 0905   06/19/16 0608  dextrose 5 % with cefUROXime (ZINACEF) ADS Med    Comments:  Beverly Gust   : cabinet override      06/19/16 0608 06/19/16 0850       V. Leia Alf, M.D. [facility] [place] Office: [phone] Pager:  [phone]

## 2016-06-20 NOTE — Progress Notes (Signed)
Report called to [place], receiving RN on  2 west. VSS. Transferred to 2w19 via wheelchair with personal belongings [name]

## 2016-06-20 NOTE — Evaluation (Signed)
Physical Therapy Evaluation Patient Details Name: [name] MRN: [id number] DOB: 1955-10-03 Today's Date: 06/20/2016   History of Present Illness  Pt is a 61 y/o female s/p L fem-pop bypass. PMH: asthma, HTN, CAD, PVD, CVA, headaches, DM, depression, and arthritis.   Clinical Impression  Pt presented supine in bed with HOB elevated, awake and willing to participate in therapy session. Prior to admission, pt reported that she was independent with all functional mobility and ADLs. Pt currently requires min A with bed mobility, min guard for transfers and min guard for safety with ambulation using a RW. All VSS throughout. Pt would continue to benefit from skilled physical therapy services at this time while admitted and after d/c to address her below listed limitations in order to improve her overall safety and independence with functional mobility.      Follow Up Recommendations Home health PT;Supervision/Assistance - 24 hour    Equipment Recommendations  Rolling walker with 5" wheels    Recommendations for Other Services       Precautions / Restrictions Precautions Precautions: Fall Restrictions Weight Bearing Restrictions: No      Mobility  Bed Mobility Overal bed mobility: Needs Assistance Bed Mobility: Supine to Sit     Supine to sit: Min assist     General bed mobility comments: assist for L LE, increased time, heavy reliance on rail  Transfers Overall transfer level: Needs assistance Equipment used: Rolling walker (2 wheeled) Transfers: Sit to/from Stand Sit to Stand: Min guard         General transfer comment: cues for hand placement, min guard for safety  Ambulation/Gait Ambulation/Gait assistance: Min guard Ambulation Distance (Feet): 150 Feet Assistive device: Rolling walker (2 wheeled) Gait Pattern/deviations: Step-through pattern;Decreased stride length Gait velocity: decreased Gait velocity interpretation: Below normal speed for  age/gender General Gait Details: pt demonstrated safety with RW, no instability or LOB noted, min guard for safety  Stairs            Wheelchair Mobility    Modified Rankin (Stroke Patients Only)       Balance Overall balance assessment: Needs assistance Sitting-balance support: Feet supported;No upper extremity supported Sitting balance-Leahy Scale: Good Sitting balance - Comments: maintains L LE off of floor due to pain   Standing balance support: During functional activity;No upper extremity supported Standing balance-Leahy Scale: Fair Standing balance comment: Pt is able to release walker in static standing.                             Pertinent Vitals/Pain Pain Assessment: Faces Faces Pain Scale: Hurts little more Pain Location: L LE Pain Descriptors / Indicators: Grimacing;Guarding;Moaning Pain Intervention(s): Monitored during session;Premedicated before session;Repositioned    Home Living Family/patient expects to be discharged to:: Private residence Living Arrangements: Alone Available Help at Discharge: Family;Friend(s);Available PRN/intermittently Type of Home: Apartment Home Access: Level entry     Home Layout: One level Home Equipment: Cane - quad;Bedside commode      Prior Function Level of Independence: Independent         Comments: drives     Hand Dominance   Dominant Hand: Right    Extremity/Trunk Assessment   Upper Extremity Assessment Upper Extremity Assessment: Overall WFL for tasks assessed    Lower Extremity Assessment Lower Extremity Assessment: Defer to PT evaluation       Communication   Communication: No difficulties  Cognition Arousal/Alertness: Awake/alert Behavior During Therapy: [facility] for tasks assessed/performed  Overall Cognitive Status: Within Functional Limits for tasks assessed                      General Comments      Exercises     Assessment/Plan    PT Assessment Patient needs  continued PT services  PT Problem List Decreased strength;Decreased activity tolerance;Decreased balance;Decreased mobility;Decreased coordination;Decreased knowledge of use of DME;Decreased safety awareness;Pain          PT Treatment Interventions DME instruction;Gait training;Stair training;Functional mobility training;Therapeutic exercise;Therapeutic activities;Balance training;Neuromuscular re-education;Patient/family education    PT Goals (Current goals can be found in the Care Plan section)  Acute Rehab PT Goals Patient Stated Goal: return to PLOF PT Goal Formulation: With patient Time For Goal Achievement: 07/04/16 Potential to Achieve Goals: Good    Frequency Min 3X/week   Barriers to discharge        Co-evaluation PT/OT/SLP Co-Evaluation/Treatment: Yes Reason for Co-Treatment: For patient/therapist safety PT goals addressed during session: Mobility/safety with mobility;Balance;Proper use of DME OT goals addressed during session: ADL's and self-care       End of Session Equipment Utilized During Treatment: Gait belt Activity Tolerance: Patient tolerated treatment well Patient left: in chair;with call bell/phone within reach Nurse Communication: Mobility status         Time: [id number] PT Time Calculation (min) (ACUTE ONLY): 32 min   Charges:   PT Evaluation $PT Eval Moderate Complexity: 1 Procedure     PT G CodesClearnce Sorrel Allex Madia 06/20/2016, 1:53 PM Sherie Don, [place], DPT [phone]

## 2016-06-21 ENCOUNTER — Telehealth: Payer: Self-pay | Admitting: Surgery

## 2016-06-21 ENCOUNTER — Inpatient Hospital Stay (HOSPITAL_COMMUNITY): Payer: Medicare Other

## 2016-06-21 DIAGNOSIS — Z95828 Presence of other vascular implants and grafts: Secondary | ICD-10-CM

## 2016-06-21 LAB — GLUCOSE, CAPILLARY
Glucose-Capillary: 144 mg/dL — ABNORMAL HIGH (ref 65–99)
Glucose-Capillary: 198 mg/dL — ABNORMAL HIGH (ref 65–99)
Glucose-Capillary: 165 mg/dL — ABNORMAL HIGH (ref 65–99)
Glucose-Capillary: 128 mg/dL — ABNORMAL HIGH (ref 65–99)
Glucose-Capillary: 127 mg/dL — ABNORMAL HIGH (ref 65–99)

## 2016-06-21 LAB — TYPE AND SCREEN
ABO/RH(D): B POS
Antibody Screen: POSITIVE
DAT, IgG: NEGATIVE
Donor AG Type: NEGATIVE
Donor AG Type: NEGATIVE
Unit division: 0
Unit division: 0

## 2016-06-21 MED ORDER — DICYCLOMINE HCL 20 MG PO TABS: 20.0000 mg | ORAL_TABLET | Freq: Three times a day (TID) | ORAL | Status: DC | PRN

## 2016-06-21 MED ORDER — MOMETASONE FUROATE 50 MCG/ACT NA SUSP: 2.0000 | Freq: Two times a day (BID) | NASAL | Status: DC | PRN

## 2016-06-21 MED ORDER — PANTOPRAZOLE SODIUM 40 MG PO TBEC: DELAYED_RELEASE_TABLET | ORAL | 2 refills | Status: DC

## 2016-06-21 MED ORDER — OXYCODONE-ACETAMINOPHEN 5-325 MG PO TABS: 1.0000 | ORAL_TABLET | Freq: Four times a day (QID) | ORAL | 0 refills | Status: DC | PRN

## 2016-06-21 MED ORDER — TRAMADOL HCL 50 MG PO TABS: 50.0000 mg | ORAL_TABLET | Freq: Four times a day (QID) | ORAL | 0 refills | Status: DC | PRN

## 2016-06-21 MED ORDER — DULAGLUTIDE 0.75 MG/0.5ML ~~LOC~~ SOAJ: 0.7500 mg | SUBCUTANEOUS | Status: DC

## 2016-06-21 MED ORDER — TRAMADOL HCL 50 MG PO TABS
50.0000 mg | ORAL_TABLET | Freq: Four times a day (QID) | ORAL | Status: DC | PRN
  Administered 2016-06-21 – 2016-06-24 (×6): 50 mg via ORAL
  Filled 2016-06-21 (×6): qty 1

## 2016-06-21 MED ORDER — RANITIDINE HCL 150 MG PO TABS: 150.0000 mg | ORAL_TABLET | Freq: Two times a day (BID) | ORAL | Status: DC | PRN

## 2016-06-21 NOTE — Telephone Encounter (Signed)
2 wk f/u 3/5

## 2016-06-21 NOTE — Progress Notes (Signed)
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   RIGHT    LEFT    PRESSURE WAVEFORM  PRESSURE WAVEFORM  BRACHIAL 143 Triphasic BRACHIAL 142 Triphasic  DP 122 Biphasic DP 124 Biphasic  PT 94 Biphasic PT 125 Biphasic    RIGHT LEFT  ABI 0.85 0.87     Legrand Como, RVT 06/21/2016, 10:54 AM

## 2016-06-21 NOTE — Progress Notes (Addendum)
  [facility] Progress Note  Subjective  - POD #2  Pain with incisions  Objective Vitals:   06/21/16 0324 06/21/16 0415  BP: (!) 164/82 138/68  Pulse: (!) 104 88  Resp: 20 20  Temp: 98.5 F (36.9 C) 98.8 F (37.1 C)    Intake/Output Summary (Last 24 hours) at 06/21/16 [id number] Last data filed at 06/21/16 [id number]  Gross per 24 hour  Intake              480 ml  Output                0 ml  Net              480 ml   Left groin and above knee incisions clean Doppler signals left DP, peroneal and PT  Assessment/Planning: 61 y.o. female is s/p: left femoral to above-knee popliteal bypass with PTFE 2 Days Post-Op   Getting ABIs this am.  Patient lives alone and wants additional time to work on mobilization prior to d/c.  Currently on ASA and Plavix. SQ hep for DVT prophylaxis.  Discussed d/c over the weekend.   CHRISTAN CURREY 06/21/2016 9:07 AM --  Laboratory CBC    Component Value Date/Time   WBC 6.3 06/20/2016 0600   HGB 9.9 (L) 06/20/2016 0600   HCT 30.7 (L) 06/20/2016 0600   PLT 182 06/20/2016 0600    BMET    Component Value Date/Time   NA 140 06/20/2016 0600   K 3.7 06/20/2016 0600   CL 105 06/20/2016 0600   CO2 27 06/20/2016 0600   GLUCOSE 140 (H) 06/20/2016 0600   BUN 9 06/20/2016 0600   CREATININE 0.84 06/20/2016 0600   CREATININE 0.86 01/18/2016 1428   CALCIUM 9.0 06/20/2016 0600   GFRNONAA >60 06/20/2016 0600   GFRAA >60 06/20/2016 0600    COAG Lab Results  Component Value Date   INR 1.03 06/17/2016   INR 1.0 01/18/2016   INR 1.01 11/02/2014   No results found for: PTT  Antibiotics Anti-infectives    Start     Dose/Rate Route Frequency Ordered Stop   06/19/16 2000  cefUROXime (ZINACEF) 1.5 g in dextrose 5 % 50 mL IVPB     1.5 g 100 mL/hr over 30 Minutes Intravenous Every 12 hours 06/19/16 1357 06/20/16 0836   06/19/16 1400  doxycycline (VIBRA-TABS) tablet 100 mg     100 mg Oral Daily 06/19/16 1357     06/19/16 0701   cefUROXime (ZINACEF) 1.5 g in dextrose 5 % 50 mL IVPB     1.5 g 100 mL/hr over 30 Minutes Intravenous 30 min pre-op 06/19/16 0701 06/19/16 0905   06/19/16 0608  dextrose 5 % with cefUROXime (ZINACEF) ADS Med    Comments:  Beverly Gust   : cabinet override      06/19/16 0608 06/19/16 [place], PA-C [facility] Office: [phone] Pager: [phone] 06/21/2016 9:07 AM  Agree with the above. She wants to stay another day as she lives by herself and wants to make sure she will not have problems at home. I changed her pain meds from Percocet to tramadol at her request, given her history of addiction. Anticipate d/c tomorrow  Annamarie Major

## 2016-06-21 NOTE — Telephone Encounter (Signed)
-----   Message from Mena Goes, RN sent at 06/21/2016 10:13 AM EST ----- Regarding: 2 weeks   ----- Message ----- From: Alvia Grove, PA-C Sent: 06/21/2016   9:12 AM To: [facility] Charge Pool  S/p left fem-pop 06/19/16  F/u with [facility] in 2 weeks  Thanks Maudie Mercury

## 2016-06-21 NOTE — Progress Notes (Signed)
Occupational Therapy Treatment Patient Details Name: [name] MRN: [id number] DOB: 18-Feb-1956 Today's Date: 06/21/2016    History of present illness Pt is a 61 y/o female s/p L fem-pop bypass. PMH: asthma, HTN, CAD, PVD, CVA, headaches, DM, depression, and arthritis.    OT comments  Pt currently supervision for functional mobility. Continues to require min assist for LB ADL; educated on AE and pt planning to purchase-would benefit from further education and demonstration next session. D/c plan remains appropriate. Will continue to follow acutely.   Follow Up Recommendations  Home health OT    Equipment Recommendations  Other (comment) (Adaptive equipment)    Recommendations for Other Services      Precautions / Restrictions Precautions Precautions: Fall Restrictions Weight Bearing Restrictions: No       Mobility Bed Mobility Overal bed mobility: Modified Independent             General bed mobility comments: No physical assist required, increased time  Transfers Overall transfer level: Needs assistance Equipment used: Rolling walker (2 wheeled) Transfers: Sit to/from Stand Sit to Stand: Supervision         General transfer comment: Supervision for safety, no physical assist    Balance Overall balance assessment: Needs assistance Sitting-balance support: Feet supported;No upper extremity supported Sitting balance-Leahy Scale: Good     Standing balance support: No upper extremity supported;During functional activity Standing balance-Leahy Scale: Fair                     ADL Overall ADL's : Needs assistance/impaired     Grooming: Supervision/safety;Standing           Upper Body Dressing : Supervision/safety;Standing Upper Body Dressing Details (indicate cue type and reason): to don/doff hospital gown Lower Body Dressing: Minimal assistance;Sit to/from stand   Toilet Transfer: Supervision/safety;Ambulation;RW Toilet Transfer Details  (indicate cue type and reason): Simulated         Functional mobility during ADLs: Supervision/safety;Rolling walker General ADL Comments: Discussed AE for increased independence with LB ADL; provided information and pt planning to purchase. Pt reports she feels LLE is more swollen today; educated on elevation and ambulation with RN staff over the weekend.      Vision                     Perception     Praxis      Cognition   Behavior During Therapy: WFL for tasks assessed/performed Overall Cognitive Status: Within Functional Limits for tasks assessed                       Extremity/Trunk Assessment               Exercises     Shoulder Instructions       General Comments      Pertinent Vitals/ Pain       Pain Assessment: 0-10 Pain Score: 5  Pain Location: LLE Pain Descriptors / Indicators: Sore Pain Intervention(s): Monitored during session;Repositioned  Home Living                                          Prior Functioning/Environment              Frequency  Min 2X/week        Progress Toward Goals  OT Goals(current goals can now be found  in the care plan section)  Progress towards OT goals: Progressing toward goals  Acute Rehab OT Goals Patient Stated Goal: return to PLOF OT Goal Formulation: With patient  Plan Discharge plan remains appropriate    Co-evaluation                 End of Session Equipment Utilized During Treatment: Rolling walker   Activity Tolerance Patient tolerated treatment well   Patient Left in bed;with call bell/phone within reach   Nurse Communication Mobility status        Time: [id number] OT Time Calculation (min): 20 min  Charges: OT General Charges $OT Visit: 1 Procedure OT Treatments $Self Care/Home Management : 8-22 mins  Binnie Kand M.S., OTR/L Pager: [phone]  06/21/2016, 4:22 PM

## 2016-06-22 LAB — GLUCOSE, CAPILLARY
Glucose-Capillary: 121 mg/dL — ABNORMAL HIGH (ref 65–99)
Glucose-Capillary: 156 mg/dL — ABNORMAL HIGH (ref 65–99)
Glucose-Capillary: 130 mg/dL — ABNORMAL HIGH (ref 65–99)
Glucose-Capillary: 134 mg/dL — ABNORMAL HIGH (ref 65–99)

## 2016-06-22 NOTE — Progress Notes (Signed)
Occupational Therapy Treatment Patient Details Name: [name] MRN: [id number] DOB: Apr 05, 1956 Today's Date: 06/22/2016    History of present illness Pt is a 61 y/o female s/p L fem-pop bypass. PMH: asthma, HTN, CAD, PVD, CVA, headaches, DM, depression, and arthritis.    OT comments  Pt making steady progress.  She is able to perform LB ADLs with supervision.  Will continue to follow.   Follow Up Recommendations  Home health OT    Equipment Recommendations  None recommended by OT    Recommendations for Other Services      Precautions / Restrictions Precautions Precautions: Fall       Mobility Bed Mobility Overal bed mobility: Modified Independent             General bed mobility comments: requires increased time   Transfers Overall transfer level: Needs assistance Equipment used: Rolling walker (2 wheeled) Transfers: Sit to/from Stand Sit to Stand: Supervision              Balance Overall balance assessment: Needs assistance Sitting-balance support: Feet supported;No upper extremity supported Sitting balance-Leahy Scale: Good     Standing balance support: No upper extremity supported;During functional activity Standing balance-Leahy Scale: Fair                     ADL               Lower Body Bathing: Supervison/ safety;Sit to/from stand;Bed level       Lower Body Dressing: Supervision/safety;Sit to/from stand;Bed level Lower Body Dressing Details (indicate cue type and reason): Pt unable to access feet for LB ADLs while sitting EOB due to pain.  However, when pt in bed, she is able to don/doff sock in long sitting mod I.  Instructed her to perform ADLs in this position initially until knee flexion and pain improves  Toilet Transfer: Supervision/safety;Ambulation;RW   Toileting- Clothing Manipulation and Hygiene: Supervision/safety;Sit to/from stand       Functional mobility during ADLs: Supervision/safety;Rolling  walker General ADL Comments: discussed options for use of 3in1 in tub vs standing to shower       Vision                     Perception     Praxis      Cognition   Behavior During Therapy: [name] for tasks assessed/performed Overall Cognitive Status: Within Functional Limits for tasks assessed                       Extremity/Trunk Assessment               Exercises     Shoulder Instructions       General Comments      Pertinent Vitals/ Pain       Pain Assessment: 0-10 Pain Score: 7  Pain Location: LLE Pain Descriptors / Indicators: Sore Pain Intervention(s): Monitored during session  Home Living                                          Prior Functioning/Environment              Frequency  Min 2X/week        Progress Toward Goals  OT Goals(current goals can now be found in the care plan section)  Progress towards OT goals: Progressing toward goals  Plan Discharge plan remains appropriate    Co-evaluation                 End of Session Equipment Utilized During Treatment: Rolling walker   Activity Tolerance Patient tolerated treatment well   Patient Left in bed;with call bell/phone within reach   Nurse Communication Mobility status        Time: [id number] OT Time Calculation (min): 22 min  Charges:    Lucille Passy M 06/22/2016, 4:48 PM

## 2016-06-22 NOTE — Progress Notes (Signed)
   VASCULAR SURGERY ASSESSMENT & PLAN:  3 Days Post-Op s/p: Left femoral to above-knee popliteal artery bypass with PTFE.  Some oozing from the thigh incision.   She needs to be more mobile before discharge.  Possible discharge tomorrow.  SUBJECTIVE: She feels like she is not ready to go home safely.  PHYSICAL EXAM: Vitals:   06/21/16 1359 06/21/16 2053 06/22/16 0454 06/22/16 0832  BP: 127/64 (!) 132/58 (!) 174/81 134/75  Pulse: 71 67 89 93  Resp: 20 18 12    Temp: 98.2 F (36.8 C) 97.8 F (36.6 C) 99.5 F (37.5 C) 98.4 F (36.9 C)  TempSrc: Oral Oral Oral Oral  SpO2: 96% 100% 94% 96%  Weight:      Height:       Some oozing from the thigh incision. I examined this and this appears to be skin edge bleeding. The left foot is warm and well perfused.  LABS: Lab Results  Component Value Date   WBC 6.3 06/20/2016   HGB 9.9 (L) 06/20/2016   HCT 30.7 (L) 06/20/2016   MCV 82.3 06/20/2016   PLT 182 06/20/2016   Lab Results  Component Value Date   CREATININE 0.84 06/20/2016   Lab Results  Component Value Date   INR 1.03 06/17/2016   CBG (last 3)   Recent Labs  06/21/16 1558 06/21/16 2049 06/22/16 0617  GLUCAP 128* 127* 121*    Active Problems:   PAD (peripheral artery disease) ([place])   Gae Gallop [id number] 06/22/2016

## 2016-06-23 ENCOUNTER — Inpatient Hospital Stay (HOSPITAL_COMMUNITY): Payer: Medicare Other

## 2016-06-23 DIAGNOSIS — M7989 Other specified soft tissue disorders: Secondary | ICD-10-CM

## 2016-06-23 LAB — GLUCOSE, CAPILLARY
Glucose-Capillary: 139 mg/dL — ABNORMAL HIGH (ref 65–99)
Glucose-Capillary: 145 mg/dL — ABNORMAL HIGH (ref 65–99)
Glucose-Capillary: 186 mg/dL — ABNORMAL HIGH (ref 65–99)
Glucose-Capillary: 137 mg/dL — ABNORMAL HIGH (ref 65–99)

## 2016-06-23 MED ORDER — HEPARIN SODIUM (PORCINE) 5000 UNIT/ML IJ SOLN
5000.0000 [IU] | Freq: Three times a day (TID) | INTRAMUSCULAR | Status: DC
  Administered 2016-06-24: 5000 [IU] via SUBCUTANEOUS
  Filled 2016-06-23: qty 1

## 2016-06-23 NOTE — Progress Notes (Signed)
OT Cancellation Note  Patient Details Name: [name] MRN: [id number] DOB: 08-06-1955   Cancelled Treatment:    Reason Eval/Treat Not Completed: Other (comment). Pt stated that her leg is hurting really bad right now and would prefer to wait until later--made her aware that it may be 2-3 hours before I can make it back and she voiced understanding.  [name] [id number] 06/23/2016, 9:16 AM

## 2016-06-23 NOTE — Discharge Summary (Signed)
Discharge Summary     [name] [birth year] 61 y.o. female  [id number]  Admission Date: 06/19/2016  Discharge Date: 06/24/16  Physician: Serafina Mitchell, MD  Admission Diagnosis: Peripheral vascular disease with left lower extremity claudication I70.212   HPI:   This is a 61 y.o. female who is evaluation of claudication, left leg greater than right. The patient was initially seen and evaluated by Dr. Alvester Chou for treatment of critical limb ischemia. She has undergone atherectomy and angioplasty of an occluded left superficial femoral artery as well as stenting. The stents have now occluded. When she initially presented, she was having a burning feeling in her feet as well as cramping in her calf with walking. The symptoms resolved with stenting, but have now returned. She states that she can walk approximately 100 feet. Resting alleviates her symptoms.  She comes back today because of worsening pain in her legs.  She will occasionally wake up in the middle the night with pain in her feet.  She does not have any open wounds  The patient has a strong family history of cardiovascular disease, mainly cardiac in origin. The patient has a history of stroke in the past with some mild left-sided symptoms. She has a history of tobacco abuse, however recently stopped smoking. She suffers and hypercholesterolemia which is managed with a statin. She is medically managed for hypertension.  Hospital Course:  The patient was admitted to the hospital and taken to the operating room on 06/19/2016 and underwent:  Left femoral to above-knee popliteal artery bypass graft with 6 mm external ring PTFE    The pt tolerated the procedure well and was transported to the PACU in good condition.   By POD 1, she was doing well and had walked twice.  She was transferred to the telemetry floor.  By POD 2, she wanted additional time to work on mobilization and with PT prior to discharge.  She is on  aspirin and Plavix.    On POD 3, she did have some oozing from her thigh incision.  A dressing was applied.  On POD 4, she had a mild bloody drainage on her dressing but Dr. Scot Dock was unable to express any bloody drainage from the wound.  A pressure dressing was applied.  The pt is worried about swelling in her left leg and therefore a venous duplex is ordered.  It was negative for DVT.  On POD 5, she is doing well. There was only minimal bloody drainage on the bandage.  She had brisk doppler signals in the left DP/PT.  Groin wound care was discussed with the pt.    Post operative ABI's on 06/21/16:  RIGHT    LEFT    PRESSURE WAVEFORM  PRESSURE WAVEFORM  BRACHIAL 143 Triphasic BRACHIAL 142 Triphasic  DP 122 Biphasic DP 124 Biphasic  PT 94 Biphasic PT 125 Biphasic    RIGHT LEFT  ABI 0.85 0.87     The remainder of the hospital course consisted of increasing mobilization and increasing intake of solids without difficulty.  CBC    Component Value Date/Time   WBC 6.3 06/20/2016 0600   RBC 3.73 (L) 06/20/2016 0600   HGB 9.9 (L) 06/20/2016 0600   HCT 30.7 (L) 06/20/2016 0600   PLT 182 06/20/2016 0600   MCV 82.3 06/20/2016 0600   MCH 26.5 06/20/2016 0600   MCHC 32.2 06/20/2016 0600   RDW 12.9 06/20/2016 0600   LYMPHSABS 2,173 01/18/2016 1428   MONOABS 477 01/18/2016 1428  EOSABS 53 01/18/2016 1428   BASOSABS 0 01/18/2016 1428    BMET    Component Value Date/Time   NA 140 06/20/2016 0600   K 3.7 06/20/2016 0600   CL 105 06/20/2016 0600   CO2 27 06/20/2016 0600   GLUCOSE 140 (H) 06/20/2016 0600   BUN 9 06/20/2016 0600   CREATININE 0.84 06/20/2016 0600   CREATININE 0.86 01/18/2016 1428   CALCIUM 9.0 06/20/2016 0600   GFRNONAA >60 06/20/2016 0600   GFRAA >60 06/20/2016 0600     Discharge Instructions    Call MD for:  redness, tenderness, or signs of infection (pain, swelling, bleeding, redness, odor or green/yellow discharge around incision site)    Complete  by:  As directed    Call MD for:  severe or increased pain, loss or decreased feeling  in affected limb(s)    Complete by:  As directed    Call MD for:  temperature >100.5    Complete by:  As directed    Discharge wound care:    Complete by:  As directed    Shower daily. Wash the groin wound with soap and water daily and pat dry. (No tub bath-only shower)  Then put a dry gauze or washcloth there to keep this area dry daily and as needed.  Do not use Vaseline or neosporin on your incisions.  Only use soap and water on your incisions and then protect and keep dry.   Driving Restrictions    Complete by:  As directed    No driving for 2 weeks   Increase activity slowly    Complete by:  As directed    Walk with assistance use walker or cane as needed   Lifting restrictions    Complete by:  As directed    No lifting for 2 weeks   Resume previous diet    Complete by:  As directed       Discharge Diagnosis:  Peripheral vascular disease with left lower extremity claudication I70.212  Secondary Diagnosis: Patient Active Problem List   Diagnosis Date Noted  . PAD (peripheral artery disease) ([place]) 06/19/2016  . Diabetic neuropathy ([place]) 01/31/2016  . Obesity 01/31/2016  . Mild CAD   . Claudication ([place]) 01/29/2016  . Food allergy 08/02/2015  . Recurrent urticaria 07/10/2015  . Angioedema 07/10/2015  . Perennial allergic rhinitis with a possible non-allergic component 07/10/2015  . History of asthma 07/10/2015  . S/P arterial stent-mid Lt SFA 11/03/14 11/04/2014  . Critical lower limb ischemia 11/03/2014  . Peripheral arterial disease ([place]) 11/02/2014  . HNP (herniated nucleus pulposus) with myelopathy, cervical 04/11/2014  . HNP (herniated nucleus pulposus), cervical 04/08/2014  . Pancreatitis 01/02/2013  . Abdominal pain, epigastric 12/14/2012  . IBS (irritable bowel syndrome) 09/12/2011  . GERD (gastroesophageal reflux disease) 09/12/2011  . Diverticulosis of colon with  hemorrhage 08/10/2011  . Chest pain at rest 07/06/2011    Class: Acute  . Hidradenitis, left pubic area and left lower quadrant 01/18/2011  . DM2 (diabetes mellitus, type 2) ([place]) 08/24/2007  . Hyperlipidemia LDL goal <70 08/24/2007  . Essential hypertension 08/24/2007   Past Medical History:  Diagnosis Date  . 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 Anemia    hx  . Angioedema 07/10/2015  . Anxiety   . Arthritis    "neck, left hand" (09/14/2012)  . Asthma   . Cancer ([place]) 1985   ovarian, no treatment except surgery  . Complication of anesthesia    OCCASIONAL TROUBLE TURNING NECK TO  RIGHT  . Critical lower limb ischemia    10/2014 s/p L SFA stenting  . Diverticulosis of colon with hemorrhage April 2013  . GERD (gastroesophageal reflux disease)   . H/O hiatal hernia   . Heart attack    2003 mild MI, March 2013 mild MI  . Hidradenitis    groin  . History of blood transfusion 1985 AND 2013  . Hyperlipidemia   . Hypertension   . Irritable bowel syndrome   . Left-sided weakness    since stroke, left eye trouble seeing  . Migraines   . Mild CAD    a. Cath 09/2010: mild luminal irregularities of LAD, 30% prox RCA and 20-30% mRCA, EF 65%.  . Neuropathy ([place])   . Obesity   . PAD (peripheral artery disease) ([place])    a. critical limb ischemia s/p PTA/stenting of L SFA 10/2014. c. occ prior SFA stent by angio 01/2016, for possible PV bypass.  . Pneumonia    baby  . Recurrent upper respiratory infection (URI)   . S/P arterial stent-mid Lt SFA 11/03/14 11/04/2014  . Schatzki's ring   . Sinus problem   . Stomach ulcer 1972   non-bleeding  . Stroke ([place]) 07-2007, 07-2008, 07-2009   total 3 strokes; mild left sided weakness and left eye "jumps".  . Type II diabetes mellitus ([place])   . Vitamin B 12 deficiency 07-15-2013     Allergies as of 06/23/2016      Reactions   Sulfa Antibiotics Shortness Of Breath, Palpitations   Codeine Other (See Comments)   Recovering Addict does not like to take  Narcotics   Fish Allergy Hives, Swelling   Tongue swelling   Iodine Swelling   Metformin And Related Diarrhea   Adhesive [tape] Rash   Paper tape is ok   Shellfish Allergy Swelling, Rash   Tongue swelling      Medication List    TAKE these medications   albuterol 108 (90 Base) MCG/ACT inhaler Commonly known as:  PROAIR HFA 1-2 inhalations every 4-6 hours as needed for cough or wheeze.   ALPRAZolam 0.5 MG tablet Commonly known as:  XANAX Take 0.25-0.5 mg by mouth at bedtime as needed for anxiety or sleep.   aspirin EC 81 MG tablet Take 81 mg by mouth daily.   Azelastine HCl 0.15 % Soln Place 2 sprays into both nostrils 2 (two) times daily. What changed:  when to take this  reasons to take this   CALCIUM 600 + D PO Take 1 tablet by mouth daily.   CELEXA 10 MG tablet Generic drug:  citalopram Take 10 mg by mouth daily.   cephALEXin 250 MG capsule Commonly known as:  KEFLEX Take 250 mg by mouth daily as needed (cysts).   clopidogrel 75 MG tablet Commonly known as:  PLAVIX Take 75 mg by mouth at bedtime.   dicyclomine 20 MG tablet Commonly known as:  BENTYL Take 1 tablet (20 mg total) by mouth 3 (three) times daily as needed for spasms.   doxycycline 100 MG EC tablet Commonly known as:  DORYX Take 100 mg by mouth daily as needed (cysts).   EPINEPHrine 0.3 mg/0.3 mL Soaj injection Commonly known as:  EPI-PEN Inject 0.3 mLs (0.3 mg total) into the muscle once.   Fenofibric Acid 105 MG Tabs Take 105 mg by mouth daily.   ibuprofen 800 MG tablet Commonly known as:  ADVIL,MOTRIN Take 800 mg by mouth every 8 (eight) hours as needed.   insulin aspart  100 UNIT/ML injection Commonly known as:  novoLOG Inject 5-12 Units into the skin 2 (two) times daily before a meal. Per sliding scale   insulin lispro 100 UNIT/ML injection Commonly known as:  HUMALOG Inject 5-12 Units into the skin 2 (two) times daily before a meal. Per sliding scale   KLOR-CON 10 10 MEQ  tablet Generic drug:  potassium chloride Take 10 mEq by mouth daily as needed for cramping.   loratadine 10 MG tablet Commonly known as:  CLARITIN Take 10 mg by mouth daily as needed for allergies.   meclizine 25 MG tablet Commonly known as:  ANTIVERT Take 25 mg by mouth 3 (three) times daily as needed for dizziness.   mometasone 50 MCG/ACT nasal spray Commonly known as:  NASONEX Place 2 sprays into the nose 2 (two) times daily as needed (nasal congestion).   mupirocin cream 2 % Commonly known as:  BACTROBAN Apply 1 application topically 2 (two) times daily as needed (wound care (boils)).   nitroGLYCERIN 0.4 mg/hr patch Commonly known as:  NITRODUR - Dosed in mg/24 hr Place 0.4 mg onto the skin as needed (chest pain). Reported on 07/10/2015   nitroGLYCERIN 0.4 MG SL tablet Commonly known as:  NITROSTAT Place 1 tablet (0.4 mg total) under the tongue every 5 (five) minutes as needed for chest pain. Maximum 3 doses   olmesartan-hydrochlorothiazide 20-12.5 MG tablet Commonly known as:  BENICAR HCT Take 1 tablet by mouth daily.   ONE TOUCH ULTRA TEST test strip Generic drug:  glucose blood   pantoprazole 40 MG tablet Commonly known as:  PROTONIX TAKE 1 TABLET (40 MG TOTAL) BY MOUTH TWICE DAILY. What changed:  See the new instructions.   ranitidine 150 MG tablet Commonly known as:  ZANTAC Take 1 tablet (150 mg total) by mouth 2 (two) times daily as needed for heartburn. Use one tablet one to two times daily as directed. What changed:  how much to take  how to take this  when to take this  reasons to take this   rosuvastatin 40 MG tablet Commonly known as:  CRESTOR Take 40 mg by mouth daily.   SUMAtriptan 50 MG tablet Commonly known as:  IMITREX Take 50 mg by mouth every 2 (two) hours as needed for migraine.   TOUJEO SOLOSTAR 300 UNIT/ML Sopn Generic drug:  Insulin Glargine Inject 30 Units into the skin every morning.   traMADol 50 MG tablet Commonly known as:   ULTRAM Take 1 tablet (50 mg total) by mouth every 6 (six) hours as needed for moderate pain.   TRULICITY [id number] Sopn Generic drug:  Dulaglutide Inject 0.75 mg into the skin every 7 (seven) days. On Fridays   zolpidem 10 MG tablet Commonly known as:  AMBIEN Take 5 mg by mouth at bedtime as needed for sleep.            Durable Medical Equipment        Start     Ordered   06/21/16 [phone]  For home use only DME Walker rolling  Once    Question:  Patient needs a walker to treat with the following condition  Answer:  Claudication of lower extremity ([place])   06/21/16 0916      Prescriptions given: 1.  Tramadol #30 No Refill  Instructions: 1.  No driving x 2 weeks & while taking pain medication 2.  No heavy lifting x 4 weeks. 3.  Shower daily starting 06/24/16  Disposition: home  Patient's condition: is  Good  Follow up: 1. Dr. Trula Slade in 2 weeks   Leontine Locket, PA-C [facility] [phone] 06/23/2016  8:51 AM  - For VQI Registry use ---   Post-op:  Wound infection: No  Graft infection: No  Transfusion: No    If yes, n/a units given New Arrhythmia: No Ipsilateral amputation: No, [ ]  Minor, [ ]  BKA, [ ]  AKA Discharge patency: [x ] Primary, [ ]  Primary assisted, [ ]  Secondary, [ ]  Occluded Patency judged by: [x ] Dopper only, [ ]  Palpable graft pulse, []  Palpable distal pulse, [ ]  ABI inc. > 0.15, [ ]  Duplex Discharge ABI: R 0.85, L 0.87 D/C Ambulatory Status: Ambulatory  Complications: MI: No, [ ]  Troponin only, [ ]  EKG or Clinical CHF: No Resp failure:No, [ ]  Pneumonia, [ ]  Ventilator Chg in renal function: No, [ ]  Inc. Cr > 0.5, [ ]  Temp. Dialysis,  [ ]  Permanent dialysis Stroke: No, [ ]  Minor, [ ]  Major Return to OR: No  Reason for return to OR: [ ]  Bleeding, [ ]  Infection, [ ]  Thrombosis, [ ]  Revision  Discharge medications: Statin use:  yes ASA use:  yes Plavix use:  yes Beta blocker use: no CCB use:  No ACEI use:    no ARB use:  yes Coumadin use: no

## 2016-06-23 NOTE — Progress Notes (Addendum)
Progress Note  06/23/2016 8:23 AM 4 Days Post-Op  Subjective:  Says she is still having oozing from the thigh incision  Afebrile HR [id number]  123456 systolic 0000000 RA  Vitals:   06/22/16 1449 06/22/16 2056  BP: 120/73 (!) 141/58  Pulse: 78 77  Resp: 19 18  Temp: 98.2 F (36.8 C) 98.3 F (36.8 C)    Physical Exam: Lungs:  Non labored Incisions:  Some mild bloody ooze on dressing left thigh-Dr. Scot Dock unable to express any bloody fluid. Extremities:  + palpable left popliteal pulse; left foot is warm and well perfused.  Mild swelling left leg   CBC    Component Value Date/Time   WBC 6.3 06/20/2016 0600   RBC 3.73 (L) 06/20/2016 0600   HGB 9.9 (L) 06/20/2016 0600   HCT 30.7 (L) 06/20/2016 0600   PLT 182 06/20/2016 0600   MCV 82.3 06/20/2016 0600   MCH 26.5 06/20/2016 0600   MCHC 32.2 06/20/2016 0600   RDW 12.9 06/20/2016 0600   LYMPHSABS 2,173 01/18/2016 1428   MONOABS 477 01/18/2016 1428   EOSABS 53 01/18/2016 1428   BASOSABS 0 01/18/2016 1428    BMET    Component Value Date/Time   NA 140 06/20/2016 0600   K 3.7 06/20/2016 0600   CL 105 06/20/2016 0600   CO2 27 06/20/2016 0600   GLUCOSE 140 (H) 06/20/2016 0600   BUN 9 06/20/2016 0600   CREATININE 0.84 06/20/2016 0600   CREATININE 0.86 01/18/2016 1428   CALCIUM 9.0 06/20/2016 0600   GFRNONAA >60 06/20/2016 0600   GFRAA >60 06/20/2016 0600    INR    Component Value Date/Time   INR 1.03 06/17/2016 1442     Intake/Output Summary (Last 24 hours) at 06/23/16 [id number] Last data filed at 06/22/16 [id number]  Gross per 24 hour  Intake              240 ml  Output                0 ml  Net              240 ml   ABI 06/21/16:  RIGHT    LEFT    PRESSURE WAVEFORM  PRESSURE WAVEFORM  BRACHIAL 143 Triphasic BRACHIAL 142 Triphasic  DP 122 Biphasic DP 124 Biphasic  PT 94 Biphasic PT 125 Biphasic    RIGHT LEFT  ABI 0.85 0.87     Assessment:  61 y.o. female is s/p:  left femoral to above-knee  popliteal bypass with PTFE  4 Days Post-Op   Plan: -pt with + palpable left popliteal pulse and left foot is warm and well perfused. -Some mild bloody ooze on dressing left thigh-Dr. Scot Dock unable to express any bloody fluid-pressure dressing applied -mild swelling of left leg-will get venous duplex  -case management consult for home PT/OT & equipment -DVT prophylaxis:  Heparin SQ   Leontine Locket, PA-C [facility] [phone] 06/23/2016 8:23 AM  I have interviewed the patient and examined the patient. I agree with the findings by the PA.  She has some mild left LE swelling which is not unexpected, but she is very concerned about it. We will get a venous duplex of the Left LE to rule out a DVT.  Currently there is no bleeding from the left thigh incision.  Possibly home later today if no further bleeding and duplex negative.   ADDENDUM: Venous duplex scan shows no evidence of DVT in the left lower extremity.  No further bleeding from left thigh incision. However, she feels that she needs another day of physical therapy. Therefore we'll plan discharge tomorrow.  Gae Gallop, MD [phone]

## 2016-06-23 NOTE — Progress Notes (Signed)
VASCULAR LAB PRELIMINARY  PRELIMINARY  PRELIMINARY  PRELIMINARY  Left lower extremity venous duplex completed.    Preliminary report:  There is no obvious evidence of  DVT or SVT noted in the left lower extremity.  Felicie Kocher, RVT 06/23/2016, 9:32 AM

## 2016-06-23 NOTE — Progress Notes (Signed)
Occupational Therapy Treatment Patient Details Name: [name] MRN: [id number] DOB: [birth year] Today's Date: 06/23/2016    History of present illness Pt is a 61 y/o female s/p L fem-pop bypass. PMH: asthma, HTN, CAD, PVD, CVA, headaches, DM, depression, and arthritis.    OT comments  This 61 yo female admitted and underwent above presents to acute OT with making progress towards goals with tub transfer education completed today.   Follow Up Recommendations  Home health OT;Supervision - Intermittent    Equipment Recommendations  None recommended by OT       Precautions / Restrictions Precautions Precautions: Fall Restrictions Weight Bearing Restrictions: No       Mobility Bed Mobility               General bed mobility comments: pt up in recliner upon arrival  Transfers Overall transfer level: Needs assistance Equipment used: Rolling walker (2 wheeled) Transfers: Sit to/from Stand Sit to Stand: Modified independent (Device/Increase time)                  ADL                           Toilet Transfer: Modified Independent;Comfort height toilet;Grab bars             General ADL Comments: Took pt to tub room to show her how she could use her 3n1 at home for a shower seat--she did not practice due to she was really cold and for some reason the gym was freezing                Cognition   Behavior During Therapy: [facility] for tasks assessed/performed Overall Cognitive Status: Within Functional Limits for tasks assessed                                    Pertinent Vitals/ Pain       Pain Assessment: Faces Faces Pain Scale: Hurts little more Pain Location: LLE--mainly behind knee Pain Descriptors / Indicators: Sore;Throbbing;Tightness Pain Intervention(s): Monitored during session;Repositioned         Frequency  Min 2X/week        Progress Toward Goals  OT Goals(current goals can now be found in the care plan  section)  Progress towards OT goals: Progressing toward goals     Plan Discharge plan remains appropriate       End of Session Equipment Utilized During Treatment:  (none)   Activity Tolerance Patient tolerated treatment well   Patient Left in chair;with call bell/phone within reach;with chair alarm set   Nurse Communication Patient requests pain meds        Time: [id number] OT Time Calculation (min): 21 min  Charges: OT General Charges $OT Visit: 1 Procedure OT Treatments $Self Care/Home Management : 8-22 mins  [name] [id number] 06/23/2016, 3:36 PM

## 2016-06-24 LAB — GLUCOSE, CAPILLARY
Glucose-Capillary: 112 mg/dL — ABNORMAL HIGH (ref 65–99)
Glucose-Capillary: 130 mg/dL — ABNORMAL HIGH (ref 65–99)

## 2016-06-24 NOTE — Care Management Important Message (Signed)
Important Message  Patient Details  Name: [name] MRN: [id number] Date of Birth: 1955/10/02   Medicare Important Message Given:  Yes    Nathen May 06/24/2016, 12:04 PM

## 2016-06-24 NOTE — Care Management Note (Signed)
Case Management Note Previous CM note initiated by Zenon Mayo, RN-06/19/2016, 4:14 PM    Patient Details  Name: [name] MRN: [id number] Date of Birth: November 30, 1955  Subjective/Objective:    S/p fem pop bypass, from home alone, NCM will cont to follow for dc needs. Await pt/ot eval. Per pt/ot eval rec HHPT/HHOT and rolling walker.  Patient lives alone, she has a [employer], and she has medication coverage and transportation at [employer].  NCM left [employer] with patient, awaiting pt eval,. Patient chose [facility], referral made to [facility] for HHPT/OT, Soc will begin 24-48 hrs post discharge.  NCM made referral to [facility] for rolling walker. Will need order for DME.               Action/Plan: Pt tx from 4E to 2W  Expected Discharge Date:  06/24/16               Expected Discharge Plan:  [place]  In-House Referral:     Discharge planning Services  CM Consult  Post Acute Care Choice:  Durable Medical Equipment, Home Health Choice offered to:  Patient  DME Arranged:  Walker rollingrolling walker DME Agency:  [place] [facility]  HH Arranged:  PT, OTHHPT, [place] Agency:  [place]  Status of Service:  Completed, signed off  If discussed at [place] of Stay Meetings, dates discussed:    Discharge Disposition: home with home health   Additional Comments:  06/24/16- 0945- Marvetta Gibbons RN, CM- pt for d/c home today- [facility] referral has already been made per previous CM note to [place]- notified Alvis Lemmings of pt discharge for today. [facility] has already delivered RW to room. No further CM needs noted.   Dahlia Client [place], RN 06/24/2016, 9:48 AM [phone]

## 2016-06-24 NOTE — Progress Notes (Addendum)
  Progress Note    06/24/2016 7:30 AM 5 Days Post-Op  Subjective:  Ready to go home  Afebrile HR  123456 NSR [id number] systolic [id number] RA  Vitals:   06/23/16 2048 06/24/16 0600  BP: 117/75 (!) 110/55  Pulse: 85 73  Resp: 18 18  Temp: 98.2 F (36.8 C) 98 F (36.7 C)    Physical Exam: Cardiac:  regular Lungs:  Non labored Incisions:  Minimal bloody drainage from thigh incision Extremities:  Brisk doppler signals left DP/PT   CBC    Component Value Date/Time   WBC 6.3 06/20/2016 0600   RBC 3.73 (L) 06/20/2016 0600   HGB 9.9 (L) 06/20/2016 0600   HCT 30.7 (L) 06/20/2016 0600   PLT 182 06/20/2016 0600   MCV 82.3 06/20/2016 0600   MCH 26.5 06/20/2016 0600   MCHC 32.2 06/20/2016 0600   RDW 12.9 06/20/2016 0600   LYMPHSABS 2,173 01/18/2016 1428   MONOABS 477 01/18/2016 1428   EOSABS 53 01/18/2016 1428   BASOSABS 0 01/18/2016 1428    BMET    Component Value Date/Time   NA 140 06/20/2016 0600   K 3.7 06/20/2016 0600   CL 105 06/20/2016 0600   CO2 27 06/20/2016 0600   GLUCOSE 140 (H) 06/20/2016 0600   BUN 9 06/20/2016 0600   CREATININE 0.84 06/20/2016 0600   CREATININE 0.86 01/18/2016 1428   CALCIUM 9.0 06/20/2016 0600   GFRNONAA >60 06/20/2016 0600   GFRAA >60 06/20/2016 0600    INR    Component Value Date/Time   INR 1.03 06/17/2016 1442     Intake/Output Summary (Last 24 hours) at 06/24/16 0730 Last data filed at 06/23/16 1900  Gross per 24 hour  Intake              720 ml  Output                0 ml  Net              720 ml     Assessment:  61 y.o. female is s/p:  left femoral to above-knee popliteal bypass with PTFE   5 Days Post-Op  Plan: -pt doing well this morning.  The oozing from her thigh is minimal now. -she has brisk doppler signals left DP/PT -will discharge home but will need case management for [facility] needs prior to dc -discussed groin wound care with pt  -f/u with Dr. Trula Slade in 2 weeks.   Leontine Locket, PA-C [facility] [phone] 06/24/2016 7:30 AM  I have interviewed the patient and examined the patient. I agree with the findings by the PA.  Gae Gallop, MD [phone]

## 2016-06-24 NOTE — Progress Notes (Signed)
Physical Therapy Treatment Patient Details Name: [name] MRN: [id number] DOB: Jun 14, 1955 Today's Date: 06/24/2016    History of Present Illness Pt is a 61 y/o female s/p L fem-pop bypass. PMH: asthma, HTN, CAD, PVD, CVA, headaches, DM, depression, and arthritis.     PT Comments    Pt  Progressing well towards all goals. Pt able to amb without RW this date at decreased pace but no increase in pain. Pt demo'd indep with HEP. Acute PT to con't to follow.   Follow Up Recommendations  Home health PT;Supervision/Assistance - 24 hour     Equipment Recommendations  Rolling walker with 5" wheels    Recommendations for Other Services       Precautions / Restrictions Precautions Precautions: Fall Restrictions Weight Bearing Restrictions: No    Mobility  Bed Mobility Overal bed mobility: Modified Independent Bed Mobility: Supine to Sit;Sit to Supine     Supine to sit: Modified independent (Device/Increase time) Sit to supine: Modified independent (Device/Increase time)   General bed mobility comments: used bedrail  Transfers Overall transfer level: Needs assistance Equipment used: None Transfers: Sit to/from Stand Sit to Stand: Modified independent (Device/Increase time)         General transfer comment: increased time, didn't use RW  Ambulation/Gait Ambulation/Gait assistance: Min guard Ambulation Distance (Feet): 200 Feet Assistive device: None Gait Pattern/deviations: Step-through pattern;Decreased step length - right;Decreased stance time - left;Decreased stride length;Decreased weight shift to left Gait velocity: dec Gait velocity interpretation: Below normal speed for age/gender General Gait Details: pt with mild antalgia but no episodes of LOB.    Stairs Stairs: Yes   Stair Management: One rail Right Number of Stairs: 2 General stair comments: v/c's for sequencing up with the good down with the bad  Wheelchair Mobility    Modified Rankin  (Stroke Patients Only)       Balance Overall balance assessment: Needs assistance Sitting-balance support: Feet supported;No upper extremity supported Sitting balance-Leahy Scale: Good Sitting balance - Comments: able to don socks with increased time   Standing balance support: No upper extremity supported Standing balance-Leahy Scale: Good Standing balance comment: pt able to bend down and pick up paper towel up off floor                    Cognition Arousal/Alertness: Awake/alert Behavior During Therapy: WFL for tasks assessed/performed Overall Cognitive Status: Within Functional Limits for tasks assessed                      Exercises General Exercises - Lower Extremity Ankle Circles/Pumps: AROM;Both;10 reps;Supine Quad Sets: AROM;10 reps;Supine;Left Heel Slides: AROM;Left;10 reps;Supine    General Comments General comments (skin integrity, edema, etc.): pt with some bleeding from thigh incision, dressing intact      Pertinent Vitals/Pain Pain Assessment: 0-10 Pain Score: 2  Pain Location: L LE Pain Descriptors / Indicators: Sore Pain Intervention(s): Monitored during session    Home Living                      Prior Function            PT Goals (current goals can now be found in the care plan section) Acute Rehab PT Goals Patient Stated Goal: home Progress towards PT goals: Progressing toward goals    Frequency    Min 3X/week      PT Plan Current plan remains appropriate    Co-evaluation  End of Session Equipment Utilized During Treatment: Gait belt Activity Tolerance: Patient tolerated treatment well Patient left: in bed;with call bell/phone within reach Nurse Communication:  (c/o nausea) PT Visit Diagnosis: Difficulty in walking, not elsewhere classified (R26.2);Muscle weakness (generalized) (M62.81)     Time: [id number] PT Time Calculation (min) (ACUTE ONLY): 28 min  Charges:  $Gait Training: 8-22  mins $Therapeutic Exercise: 8-22 mins                    G Codes:       Damara Klunder M Kazmir Oki June 27, 2016, 10:51 AM   Kittie Plater, PT, DPT Pager #: [phone] Office #: [phone]

## 2016-06-25 DIAGNOSIS — R531 Weakness: Secondary | ICD-10-CM | POA: Diagnosis not present

## 2016-06-25 DIAGNOSIS — G459 Transient cerebral ischemic attack, unspecified: Secondary | ICD-10-CM | POA: Diagnosis not present

## 2016-06-25 DIAGNOSIS — E119 Type 2 diabetes mellitus without complications: Secondary | ICD-10-CM | POA: Diagnosis not present

## 2016-06-25 DIAGNOSIS — L732 Hidradenitis suppurativa: Secondary | ICD-10-CM | POA: Diagnosis not present

## 2016-06-25 DIAGNOSIS — I739 Peripheral vascular disease, unspecified: Secondary | ICD-10-CM | POA: Diagnosis not present

## 2016-06-25 DIAGNOSIS — I10 Essential (primary) hypertension: Secondary | ICD-10-CM | POA: Diagnosis not present

## 2016-06-25 DIAGNOSIS — E559 Vitamin D deficiency, unspecified: Secondary | ICD-10-CM | POA: Diagnosis not present

## 2016-06-25 DIAGNOSIS — Z95828 Presence of other vascular implants and grafts: Secondary | ICD-10-CM | POA: Diagnosis not present

## 2016-06-28 ENCOUNTER — Telehealth: Payer: Self-pay

## 2016-06-28 DIAGNOSIS — I739 Peripheral vascular disease, unspecified: Secondary | ICD-10-CM | POA: Diagnosis not present

## 2016-06-28 DIAGNOSIS — R531 Weakness: Secondary | ICD-10-CM | POA: Diagnosis not present

## 2016-06-28 DIAGNOSIS — L732 Hidradenitis suppurativa: Secondary | ICD-10-CM | POA: Diagnosis not present

## 2016-06-28 DIAGNOSIS — E119 Type 2 diabetes mellitus without complications: Secondary | ICD-10-CM | POA: Diagnosis not present

## 2016-06-28 DIAGNOSIS — Z95828 Presence of other vascular implants and grafts: Secondary | ICD-10-CM | POA: Diagnosis not present

## 2016-06-28 NOTE — Telephone Encounter (Signed)
Rec'd call from [facility] RN. Reported the left inner thigh incision, prox. to knee, is oozing a bloody fluid.  Reported incision is intact.  Stated the left leg has swelling from foot to thigh, but denied any localized area of redness/ inflammation.  Reported she placed a dry gauze dressing over the incision and that the pt. Has been redressing the incision between [facility] visits.  Encouraged have pt. Change dressing as needed to maintain clean/ dry incisional area,  to monitor for any signs of infection,  fever/ chills, or dehiscence of incision, and report to office.  Encouraged to call office Monday with any concerns.  Verb. Understanding.

## 2016-07-01 ENCOUNTER — Encounter: Payer: Self-pay | Admitting: Surgery

## 2016-07-01 ENCOUNTER — Ambulatory Visit: Payer: Medicare Other | Admitting: Surgery

## 2016-07-01 ENCOUNTER — Ambulatory Visit (INDEPENDENT_AMBULATORY_CARE_PROVIDER_SITE_OTHER): Payer: Self-pay | Admitting: Physician Assistant

## 2016-07-01 ENCOUNTER — Encounter (HOSPITAL_COMMUNITY): Payer: Medicare Other

## 2016-07-01 ENCOUNTER — Telehealth: Payer: Self-pay

## 2016-07-01 VITALS — BP 121/76 | HR 103 | Temp 98.7°F | Resp 18 | Ht 64.0 in | Wt 148.8 lb

## 2016-07-01 DIAGNOSIS — E119 Type 2 diabetes mellitus without complications: Secondary | ICD-10-CM | POA: Diagnosis not present

## 2016-07-01 DIAGNOSIS — I70212 Atherosclerosis of native arteries of extremities with intermittent claudication, left leg: Secondary | ICD-10-CM

## 2016-07-01 DIAGNOSIS — Z95828 Presence of other vascular implants and grafts: Secondary | ICD-10-CM | POA: Diagnosis not present

## 2016-07-01 DIAGNOSIS — R531 Weakness: Secondary | ICD-10-CM | POA: Diagnosis not present

## 2016-07-01 DIAGNOSIS — I739 Peripheral vascular disease, unspecified: Secondary | ICD-10-CM | POA: Diagnosis not present

## 2016-07-01 DIAGNOSIS — L732 Hidradenitis suppurativa: Secondary | ICD-10-CM | POA: Diagnosis not present

## 2016-07-01 NOTE — Telephone Encounter (Signed)
rec'd call from [facility] RN; reported the pt. Has continued to have frequent bloody drainage from left LE incision.  Called pt.; stated she continues to have a lot of swelling in the left LE.  Reported the bleeding is coming from mid-incision.  Stated that she changes the dressing, and then notices the bleeding on the bandage shortly after having changed it.  Stated the drainage doesn't appear to be diluted; described as "bloody" drainage.  Appt. Given today at 3:30 PM.  Pt. Agreed.

## 2016-07-01 NOTE — Progress Notes (Signed)
POST OPERATIVE OFFICE NOTE    CC:  F/u for surgery  HPI:  This is a 61 y.o. female who is s/p Left femoral to above-knee popliteal artery bypass graft with 6 mm external ring PTFE.  She reports bleeding from the proximal above knee incision since surgery and was concerned.  She has not had a fever or chills, but some mild thigh edema.    Allergies  Allergen Reactions  . Sulfa Antibiotics Shortness Of Breath and Palpitations  . 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 Fish Allergy Hives and Swelling    Tongue swelling  . Iodine Swelling  . Metformin And Related Diarrhea  . Adhesive [Tape] Rash    Paper tape is ok  . Shellfish Allergy Swelling and Rash    Tongue swelling    Current Outpatient Prescriptions  Medication Sig Dispense Refill  . albuterol (PROAIR HFA) 108 (90 Base) MCG/ACT inhaler 1-2 inhalations every 4-6 hours as needed for cough or wheeze. 1 Inhaler 1  . ALPRAZolam (XANAX) 0.5 MG tablet Take 0.25-0.5 mg by mouth at bedtime as needed for anxiety or sleep.     Marland Kitchen aspirin EC 81 MG tablet Take 81 mg by mouth daily.    . Azelastine HCl 0.15 % SOLN Place 2 sprays into both nostrils 2 (two) times daily. (Patient taking differently: Place 2 sprays into both nostrils 2 (two) times daily as needed (allergies). ) 30 mL 5  . Calcium Carb-Cholecalciferol (CALCIUM 600 + D PO) Take 1 tablet by mouth daily.    . cephALEXin (KEFLEX) 250 MG capsule Take 250 mg by mouth daily as needed (cysts).     . citalopram (CELEXA) 10 MG tablet Take 10 mg by mouth daily.    . clopidogrel (PLAVIX) 75 MG tablet Take 75 mg by mouth at bedtime.     . dicyclomine (BENTYL) 20 MG tablet Take 1 tablet (20 mg total) by mouth 3 (three) times daily as needed for spasms.    Marland Kitchen doxycycline (DORYX) 100 MG EC tablet Take 100 mg by mouth daily as needed (cysts).     . EPINEPHrine 0.3 mg/0.3 mL IJ SOAJ injection Inject 0.3 mLs (0.3 mg total) into the muscle once. 1 Device  1  . Fenofibric Acid 105 MG TABS Take 105 mg by mouth daily.     Marland Kitchen ibuprofen (ADVIL,MOTRIN) 800 MG tablet Take 800 mg by mouth every 8 (eight) hours as needed.    . insulin aspart (NOVOLOG) 100 UNIT/ML injection Inject 5-12 Units into the skin 2 (two) times daily before a meal. Per sliding scale    . Insulin Glargine (TOUJEO SOLOSTAR) 300 UNIT/ML SOPN Inject 30 Units into the skin every morning.     . insulin lispro (HUMALOG) 100 UNIT/ML injection Inject 5-12 Units into the skin 2 (two) times daily before a meal. Per sliding scale    . KLOR-CON 10 10 MEQ tablet Take 10 mEq by mouth daily as needed for cramping.    . loratadine (CLARITIN) 10 MG tablet Take 10 mg by mouth daily as needed for allergies.    Marland Kitchen meclizine (ANTIVERT) 25 MG tablet Take 25 mg by mouth 3 (three) times daily as needed for dizziness.    . mometasone (NASONEX) 50 MCG/ACT nasal spray Place 2 sprays into the nose 2 (two) times daily as needed (nasal congestion).    . mupirocin cream (BACTROBAN) 2 % Apply 1 application topically 2 (two) times daily as  needed (wound care (boils)).     . nitroGLYCERIN (NITRODUR - DOSED IN MG/24 HR) 0.4 mg/hr Place 0.4 mg onto the skin as needed (chest pain). Reported on 07/10/2015    . nitroGLYCERIN (NITROSTAT) 0.4 MG SL tablet Place 1 tablet (0.4 mg total) under the tongue every 5 (five) minutes as needed for chest pain. Maximum 3 doses 25 tablet 12  . olmesartan-hydrochlorothiazide (BENICAR HCT) 20-12.5 MG tablet Take 1 tablet by mouth daily.    . ONE TOUCH ULTRA TEST test strip     . pantoprazole (PROTONIX) 40 MG tablet TAKE 1 TABLET (40 MG TOTAL) BY MOUTH TWICE DAILY. 90 tablet 2  . ranitidine (ZANTAC) 150 MG tablet Take 1 tablet (150 mg total) by mouth 2 (two) times daily as needed for heartburn. Use one tablet one to two times daily as directed.    . rosuvastatin (CRESTOR) 40 MG tablet Take 40 mg by mouth daily.    . SUMAtriptan (IMITREX) 50 MG tablet Take 50 mg by mouth every 2 (two) hours as  needed for migraine.    . traMADol (ULTRAM) 50 MG tablet Take 1 tablet (50 mg total) by mouth every 6 (six) hours as needed for moderate pain. 30 tablet 0  . TRULICITY A999333 0000000 SOPN Inject 0.75 mg into the skin every 7 (seven) days. On Fridays    . zolpidem (AMBIEN) 10 MG tablet Take 5 mg by mouth at bedtime as needed for sleep.     No current facility-administered medications for this visit.      ROS:  See HPI  Physical Exam:  Vitals:   07/01/16 1541  BP: 121/76  Pulse: (!) 103  Resp: 18  Temp: 98.7 F (37.1 C)    Incision:  No hematoma, compartments soft, and no erythema.  Minimal edema in the thigh and anterior shin.  No incisional dehiscent visualized.  Under clean conditions silver nitrate stick to skin edge and placed steri strips.  No bleeding at site was expressed.   Dry 4 x 4 placed over steri strips.  Doppler PT/DP/peroneal all signals No foot edema with good skin lines.    Assessment/Plan:  This is a 61 y.o. female who is s/p: Left femoral to above-knee popliteal artery bypass graft with 6 mm external ring PTFE  Patent by pass graft without evidence of infection.   Keep f/u appointment next week with Dr. Fae Pippin, Sohana [facility] PA-C [facility] [phone]  Clinic MD:  Trula Slade

## 2016-07-02 ENCOUNTER — Encounter: Payer: Self-pay | Admitting: Surgery

## 2016-07-03 DIAGNOSIS — Z95828 Presence of other vascular implants and grafts: Secondary | ICD-10-CM | POA: Diagnosis not present

## 2016-07-03 DIAGNOSIS — L732 Hidradenitis suppurativa: Secondary | ICD-10-CM | POA: Diagnosis not present

## 2016-07-03 DIAGNOSIS — E119 Type 2 diabetes mellitus without complications: Secondary | ICD-10-CM | POA: Diagnosis not present

## 2016-07-03 DIAGNOSIS — I739 Peripheral vascular disease, unspecified: Secondary | ICD-10-CM | POA: Diagnosis not present

## 2016-07-03 DIAGNOSIS — R531 Weakness: Secondary | ICD-10-CM | POA: Diagnosis not present

## 2016-07-04 DIAGNOSIS — E119 Type 2 diabetes mellitus without complications: Secondary | ICD-10-CM | POA: Diagnosis not present

## 2016-07-04 DIAGNOSIS — R531 Weakness: Secondary | ICD-10-CM | POA: Diagnosis not present

## 2016-07-04 DIAGNOSIS — Z95828 Presence of other vascular implants and grafts: Secondary | ICD-10-CM | POA: Diagnosis not present

## 2016-07-04 DIAGNOSIS — L732 Hidradenitis suppurativa: Secondary | ICD-10-CM | POA: Diagnosis not present

## 2016-07-04 DIAGNOSIS — I739 Peripheral vascular disease, unspecified: Secondary | ICD-10-CM | POA: Diagnosis not present

## 2016-07-05 DIAGNOSIS — E119 Type 2 diabetes mellitus without complications: Secondary | ICD-10-CM | POA: Diagnosis not present

## 2016-07-05 DIAGNOSIS — Z95828 Presence of other vascular implants and grafts: Secondary | ICD-10-CM | POA: Diagnosis not present

## 2016-07-05 DIAGNOSIS — R531 Weakness: Secondary | ICD-10-CM | POA: Diagnosis not present

## 2016-07-05 DIAGNOSIS — I739 Peripheral vascular disease, unspecified: Secondary | ICD-10-CM | POA: Diagnosis not present

## 2016-07-05 DIAGNOSIS — L732 Hidradenitis suppurativa: Secondary | ICD-10-CM | POA: Diagnosis not present

## 2016-07-08 ENCOUNTER — Ambulatory Visit (INDEPENDENT_AMBULATORY_CARE_PROVIDER_SITE_OTHER): Payer: Self-pay | Admitting: Surgery

## 2016-07-08 ENCOUNTER — Encounter: Payer: Self-pay | Admitting: Surgery

## 2016-07-08 VITALS — BP 117/70 | HR 114 | Temp 98.5°F | Resp 18 | Ht 64.0 in | Wt 144.5 lb

## 2016-07-08 DIAGNOSIS — I739 Peripheral vascular disease, unspecified: Secondary | ICD-10-CM

## 2016-07-08 DIAGNOSIS — I70212 Atherosclerosis of native arteries of extremities with intermittent claudication, left leg: Secondary | ICD-10-CM

## 2016-07-08 MED ORDER — CEPHALEXIN 500 MG PO CAPS: 500.0000 mg | ORAL_CAPSULE | Freq: Three times a day (TID) | ORAL | 0 refills | Status: DC

## 2016-07-08 NOTE — Progress Notes (Signed)
Patient name: [name] MRN: [id number] DOB: Jan 31, 1956 Sex: female  REASON FOR VISIT:     status post left femoral Popliteal bypass  HISTORY OF PRESENT ILLNESS:   [name] is a 61 y.o. female status post left femoral-popliteal bypass with PTFEOn 06/19/2016.  This was done for lifestyle limiting claudication.  She had previously undergone atherectomy and angioplasty by Dr. Gwenlyn Found.  These interventions alleviated her symptoms.  Despite trying to persuade her towards medical management given the fact that she did not have an adequate saphenous vein, the patient strongly wishes to pursue surgery.  Her postoperative course was uncomplicated.  She was seen in the clinic last week and noted to have some drainage from her wound and the above-knee incision.  It looked pretty healthy and therefore no treatment was recommended.  She is back today stating that she is having consistent drainage from her leg.  She denies fevers or chills.  CURRENT MEDICATIONS:    Current Outpatient Prescriptions  Medication Sig Dispense Refill  . albuterol (PROAIR HFA) 108 (90 Base) MCG/ACT inhaler 1-2 inhalations every 4-6 hours as needed for cough or wheeze. 1 Inhaler 1  . ALPRAZolam (XANAX) 0.5 MG tablet Take 0.25-0.5 mg by mouth at bedtime as needed for anxiety or sleep.     Marland Kitchen aspirin EC 81 MG tablet Take 81 mg by mouth daily.    . Azelastine HCl 0.15 % SOLN Place 2 sprays into both nostrils 2 (two) times daily. (Patient taking differently: Place 2 sprays into both nostrils 2 (two) times daily as needed (allergies). ) 30 mL 5  . Calcium Carb-Cholecalciferol (CALCIUM 600 + D PO) Take 1 tablet by mouth daily.    . citalopram (CELEXA) 10 MG tablet Take 10 mg by mouth daily.    . clopidogrel (PLAVIX) 75 MG tablet Take 75 mg by mouth at bedtime.     . dicyclomine (BENTYL) 20 MG tablet Take 1 tablet (20 mg total) by mouth 3 (three) times daily as needed for spasms.    Marland Kitchen  doxycycline (DORYX) 100 MG EC tablet Take 100 mg by mouth daily as needed (cysts).     . EPINEPHrine 0.3 mg/0.3 mL IJ SOAJ injection Inject 0.3 mLs (0.3 mg total) into the muscle once. 1 Device 1  . Fenofibric Acid 105 MG TABS Take 105 mg by mouth daily.     Marland Kitchen ibuprofen (ADVIL,MOTRIN) 800 MG tablet Take 800 mg by mouth every 8 (eight) hours as needed.    . insulin aspart (NOVOLOG) 100 UNIT/ML injection Inject 5-12 Units into the skin 2 (two) times daily before a meal. Per sliding scale    . Insulin Glargine (TOUJEO SOLOSTAR) 300 UNIT/ML SOPN Inject 30 Units into the skin every morning.     . insulin lispro (HUMALOG) 100 UNIT/ML injection Inject 5-12 Units into the skin 2 (two) times daily before a meal. Per sliding scale    . KLOR-CON 10 10 MEQ tablet Take 10 mEq by mouth daily as needed for cramping.    . loratadine (CLARITIN) 10 MG tablet Take 10 mg by mouth daily as needed for allergies.    Marland Kitchen meclizine (ANTIVERT) 25 MG tablet Take 25 mg by mouth 3 (three) times daily as needed for dizziness.    . mometasone (NASONEX) 50 MCG/ACT nasal spray Place 2 sprays into the nose 2 (two) times daily as needed (nasal congestion).    . mupirocin cream (BACTROBAN) 2 % Apply 1 application topically 2 (two) times daily as  needed (wound care (boils)).     . nitroGLYCERIN (NITRODUR - DOSED IN MG/24 HR) 0.4 mg/hr Place 0.4 mg onto the skin as needed (chest pain). Reported on 07/10/2015    . nitroGLYCERIN (NITROSTAT) 0.4 MG SL tablet Place 1 tablet (0.4 mg total) under the tongue every 5 (five) minutes as needed for chest pain. Maximum 3 doses 25 tablet 12  . olmesartan-hydrochlorothiazide (BENICAR HCT) 20-12.5 MG tablet Take 1 tablet by mouth daily.    . ONE TOUCH ULTRA TEST test strip     . pantoprazole (PROTONIX) 40 MG tablet TAKE 1 TABLET (40 MG TOTAL) BY MOUTH TWICE DAILY. 90 tablet 2  . ranitidine (ZANTAC) 150 MG tablet Take 1 tablet (150 mg total) by mouth 2 (two) times daily as needed for heartburn. Use one  tablet one to two times daily as directed.    . rosuvastatin (CRESTOR) 40 MG tablet Take 40 mg by mouth daily.    . SUMAtriptan (IMITREX) 50 MG tablet Take 50 mg by mouth every 2 (two) hours as needed for migraine.    . traMADol (ULTRAM) 50 MG tablet Take 1 tablet (50 mg total) by mouth every 6 (six) hours as needed for moderate pain. 30 tablet 0  . TRULICITY [id number] [id number] SOPN Inject 0.75 mg into the skin every 7 (seven) days. On Fridays    . zolpidem (AMBIEN) 10 MG tablet Take 5 mg by mouth at bedtime as needed for sleep.    . cephALEXin (KEFLEX) 500 MG capsule Take 1 capsule (500 mg total) by mouth 3 (three) times daily. 30 capsule 0   No current facility-administered medications for this visit.     REVIEW OF SYSTEMS:   [X]  denotes positive finding, [ ]  denotes negative finding Cardiac  Comments:  Chest pain or chest pressure:    Shortness of breath upon exertion:    Short of breath when lying flat:    Irregular heart rhythm:    Constitutional    Fever or chills:      PHYSICAL EXAM:   Vitals:   07/08/16 1332  BP: 117/70  Pulse: (!) 114  Resp: 18  Temp: 98.5 F (36.9 C)  TempSrc: Oral  SpO2: 97%  Weight: 144 lb 8 oz (65.5 kg)  Height: 5\' 4"  (1.626 m)    GENERAL: The patient is a well-nourished female, in no acute distress. The vital signs are documented above. CARDIOVASCULAR: There is a regular rate and rhythm. PULMONARY: Non-labored respirations Dark bloody drainage from the superior aspect of the above-knee incision.  I probed this with a Q-tip it went down approximately 1-1 0.5 cm.  I do not feel that it went deep enough to get to the graft.  I packed this with a piece of 4 x 4  STUDIES:   None   MEDICAL ISSUES:   I have recommended daily dressing changes to the new above-knee popliteal artery incision.  I will have home health assist with this.  I'm also placing her on Keflex for 10 days.  She will follow-up with me in 2 weeks.  If she has a deterioration in  her condition she will contact me.  Obviously concerned given the fact that she has cortex in her leg that this could become a deeper infection and caused the graft to get infected.  Annamarie Major, MD [facility] [phone] Pager [phone]

## 2016-07-09 DIAGNOSIS — E119 Type 2 diabetes mellitus without complications: Secondary | ICD-10-CM | POA: Diagnosis not present

## 2016-07-09 DIAGNOSIS — I739 Peripheral vascular disease, unspecified: Secondary | ICD-10-CM | POA: Diagnosis not present

## 2016-07-09 DIAGNOSIS — L732 Hidradenitis suppurativa: Secondary | ICD-10-CM | POA: Diagnosis not present

## 2016-07-09 DIAGNOSIS — R531 Weakness: Secondary | ICD-10-CM | POA: Diagnosis not present

## 2016-07-09 DIAGNOSIS — Z95828 Presence of other vascular implants and grafts: Secondary | ICD-10-CM | POA: Diagnosis not present

## 2016-07-10 ENCOUNTER — Telehealth: Payer: Self-pay

## 2016-07-10 DIAGNOSIS — L732 Hidradenitis suppurativa: Secondary | ICD-10-CM | POA: Diagnosis not present

## 2016-07-10 DIAGNOSIS — Z95828 Presence of other vascular implants and grafts: Secondary | ICD-10-CM | POA: Diagnosis not present

## 2016-07-10 DIAGNOSIS — I739 Peripheral vascular disease, unspecified: Secondary | ICD-10-CM | POA: Diagnosis not present

## 2016-07-10 DIAGNOSIS — R531 Weakness: Secondary | ICD-10-CM | POA: Diagnosis not present

## 2016-07-10 DIAGNOSIS — E119 Type 2 diabetes mellitus without complications: Secondary | ICD-10-CM | POA: Diagnosis not present

## 2016-07-10 NOTE — Telephone Encounter (Signed)
-----   Message from Serafina Mitchell, MD sent at 07/09/2016 10:01 PM EST ----- Regarding: RE: clarification of dressing orders That Is correct ----- Message ----- From: Denman George, RN Sent: 07/09/2016   4:40 PM To: Serafina Mitchell, MD Subject: clarification of dressing orders               [facility] RN called to clarify if the dressing changes to the left AK incision should be wet to dry?  I advised if the wound was open to go ahead and do wet to dry NS dressings.  Any different recommendations?

## 2016-07-11 DIAGNOSIS — E119 Type 2 diabetes mellitus without complications: Secondary | ICD-10-CM | POA: Diagnosis not present

## 2016-07-11 DIAGNOSIS — L732 Hidradenitis suppurativa: Secondary | ICD-10-CM | POA: Diagnosis not present

## 2016-07-11 DIAGNOSIS — I739 Peripheral vascular disease, unspecified: Secondary | ICD-10-CM | POA: Diagnosis not present

## 2016-07-11 DIAGNOSIS — Z95828 Presence of other vascular implants and grafts: Secondary | ICD-10-CM | POA: Diagnosis not present

## 2016-07-11 DIAGNOSIS — R531 Weakness: Secondary | ICD-10-CM | POA: Diagnosis not present

## 2016-07-12 ENCOUNTER — Encounter: Payer: Self-pay | Admitting: Surgery

## 2016-07-12 DIAGNOSIS — L732 Hidradenitis suppurativa: Secondary | ICD-10-CM | POA: Diagnosis not present

## 2016-07-12 DIAGNOSIS — E119 Type 2 diabetes mellitus without complications: Secondary | ICD-10-CM | POA: Diagnosis not present

## 2016-07-12 DIAGNOSIS — I739 Peripheral vascular disease, unspecified: Secondary | ICD-10-CM | POA: Diagnosis not present

## 2016-07-12 DIAGNOSIS — R531 Weakness: Secondary | ICD-10-CM | POA: Diagnosis not present

## 2016-07-12 DIAGNOSIS — Z95828 Presence of other vascular implants and grafts: Secondary | ICD-10-CM | POA: Diagnosis not present

## 2016-07-15 DIAGNOSIS — E119 Type 2 diabetes mellitus without complications: Secondary | ICD-10-CM | POA: Diagnosis not present

## 2016-07-15 DIAGNOSIS — I739 Peripheral vascular disease, unspecified: Secondary | ICD-10-CM | POA: Diagnosis not present

## 2016-07-15 DIAGNOSIS — Z95828 Presence of other vascular implants and grafts: Secondary | ICD-10-CM | POA: Diagnosis not present

## 2016-07-15 DIAGNOSIS — R531 Weakness: Secondary | ICD-10-CM | POA: Diagnosis not present

## 2016-07-15 DIAGNOSIS — L732 Hidradenitis suppurativa: Secondary | ICD-10-CM | POA: Diagnosis not present

## 2016-07-16 ENCOUNTER — Telehealth: Payer: Self-pay

## 2016-07-16 ENCOUNTER — Encounter: Payer: Self-pay | Admitting: Surgery

## 2016-07-16 NOTE — Telephone Encounter (Signed)
Phone call from pt.  Reported she is still having a lot of pain post. Left knee, and is not able to sleep very much, due to the pain.  Reported post. left knee "just hurts".  Requested refill on Tramadol.  Stated he has some increased swelling of the left leg from ankle to thigh.  Stated there is no warmth or redness in (L) lower leg.  Reported "some redness" in the post. left knee area.  Denied fever.  Reported some chills at time.  Stated she has had frequent bloody drainage from (L) leg incisional area, that Dr. Trula Slade opened up last week.  Reported the [facility] RN packs the wound 3x/ week, and she changes the gauze dressing about 2x/ day, to keep a dry dressing over the wound.  Recommended appt. to eval. left knee incision, since her pain is not improving.  Appt. given at 2:00 PM 3/14, with PA.  Agreed with plan.

## 2016-07-17 ENCOUNTER — Ambulatory Visit (INDEPENDENT_AMBULATORY_CARE_PROVIDER_SITE_OTHER): Payer: Self-pay | Admitting: Physician Assistant

## 2016-07-17 VITALS — BP 107/75 | HR 104 | Temp 98.4°F | Resp 16 | Ht 64.0 in | Wt 144.0 lb

## 2016-07-17 DIAGNOSIS — L732 Hidradenitis suppurativa: Secondary | ICD-10-CM | POA: Diagnosis not present

## 2016-07-17 DIAGNOSIS — Z95828 Presence of other vascular implants and grafts: Secondary | ICD-10-CM | POA: Diagnosis not present

## 2016-07-17 DIAGNOSIS — R531 Weakness: Secondary | ICD-10-CM | POA: Diagnosis not present

## 2016-07-17 DIAGNOSIS — E119 Type 2 diabetes mellitus without complications: Secondary | ICD-10-CM | POA: Diagnosis not present

## 2016-07-17 DIAGNOSIS — I739 Peripheral vascular disease, unspecified: Secondary | ICD-10-CM

## 2016-07-17 MED ORDER — METHOCARBAMOL 500 MG PO TABS: 500.0000 mg | ORAL_TABLET | Freq: Every evening | ORAL | 0 refills | Status: DC | PRN

## 2016-07-17 MED ORDER — TRAMADOL HCL 50 MG PO TABS: 50.0000 mg | ORAL_TABLET | Freq: Four times a day (QID) | ORAL | 0 refills | Status: DC | PRN

## 2016-07-17 NOTE — Progress Notes (Signed)
POST OPERATIVE OFFICE NOTE    CC:  F/u for surgery  HPI:  This is a 61 y.o. female who is s/p left femoral-popliteal bypass with PTFEOn 06/19/2016.  This was done for lifestyle limiting claudication.  She was seen last week in the clinic by Dr. Trula Slade with complaints of drainage at the above knee incision.  She denies fevers or chills.  Dr. Trula Slade  recommended daily dressing changes to the new above-knee popliteal artery incision.  I will have home health assist with this.  I'm also placing her on Keflex for 10 days.  She is here today with complaints of posterior leg pain to the back of the knee.  The drainage has slowed down significantly and the wound has healed quite well.    Allergies  Allergen Reactions  . Sulfa Antibiotics Shortness Of Breath and Palpitations  . 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 Fish Allergy Hives and Swelling    Tongue swelling  . Iodine Swelling  . Metformin And Related Diarrhea  . Adhesive [Tape] Rash    Paper tape is ok  . Shellfish Allergy Swelling and Rash    Tongue swelling    Current Outpatient Prescriptions  Medication Sig Dispense Refill  . albuterol (PROAIR HFA) 108 (90 Base) MCG/ACT inhaler 1-2 inhalations every 4-6 hours as needed for cough or wheeze. 1 Inhaler 1  . ALPRAZolam (XANAX) 0.5 MG tablet Take 0.25-0.5 mg by mouth at bedtime as needed for anxiety or sleep.     Marland Kitchen aspirin EC 81 MG tablet Take 81 mg by mouth daily.    . Azelastine HCl 0.15 % SOLN Place 2 sprays into both nostrils 2 (two) times daily. (Patient taking differently: Place 2 sprays into both nostrils 2 (two) times daily as needed (allergies). ) 30 mL 5  . Calcium Carb-Cholecalciferol (CALCIUM 600 + D PO) Take 1 tablet by mouth daily.    . cephALEXin (KEFLEX) 500 MG capsule Take 1 capsule (500 mg total) by mouth 3 (three) times daily. 30 capsule 0  . citalopram (CELEXA) 10 MG tablet Take 10 mg by mouth daily.    . clopidogrel  (PLAVIX) 75 MG tablet Take 75 mg by mouth at bedtime.     . dicyclomine (BENTYL) 20 MG tablet Take 1 tablet (20 mg total) by mouth 3 (three) times daily as needed for spasms.    Marland Kitchen doxycycline (DORYX) 100 MG EC tablet Take 100 mg by mouth daily as needed (cysts).     . EPINEPHrine 0.3 mg/0.3 mL IJ SOAJ injection Inject 0.3 mLs (0.3 mg total) into the muscle once. 1 Device 1  . Fenofibric Acid 105 MG TABS Take 105 mg by mouth daily.     Marland Kitchen ibuprofen (ADVIL,MOTRIN) 800 MG tablet Take 800 mg by mouth every 8 (eight) hours as needed.    . insulin aspart (NOVOLOG) 100 UNIT/ML injection Inject 5-12 Units into the skin 2 (two) times daily before a meal. Per sliding scale    . Insulin Glargine (TOUJEO SOLOSTAR) 300 UNIT/ML SOPN Inject 30 Units into the skin every morning.     . insulin lispro (HUMALOG) 100 UNIT/ML injection Inject 5-12 Units into the skin 2 (two) times daily before a meal. Per sliding scale    . KLOR-CON 10 10 MEQ tablet Take 10 mEq by mouth daily as needed for cramping.    . loratadine (CLARITIN) 10 MG tablet Take 10 mg by mouth daily as needed  for allergies.    Marland Kitchen meclizine (ANTIVERT) 25 MG tablet Take 25 mg by mouth 3 (three) times daily as needed for dizziness.    . mometasone (NASONEX) 50 MCG/ACT nasal spray Place 2 sprays into the nose 2 (two) times daily as needed (nasal congestion).    . mupirocin cream (BACTROBAN) 2 % Apply 1 application topically 2 (two) times daily as needed (wound care (boils)).     . nitroGLYCERIN (NITRODUR - DOSED IN MG/24 HR) 0.4 mg/hr Place 0.4 mg onto the skin as needed (chest pain). Reported on 07/10/2015    . nitroGLYCERIN (NITROSTAT) 0.4 MG SL tablet Place 1 tablet (0.4 mg total) under the tongue every 5 (five) minutes as needed for chest pain. Maximum 3 doses 25 tablet 12  . olmesartan-hydrochlorothiazide (BENICAR HCT) 20-12.5 MG tablet Take 1 tablet by mouth daily.    . ONE TOUCH ULTRA TEST test strip     . pantoprazole (PROTONIX) 40 MG tablet TAKE 1  TABLET (40 MG TOTAL) BY MOUTH TWICE DAILY. 90 tablet 2  . ranitidine (ZANTAC) 150 MG tablet Take 1 tablet (150 mg total) by mouth 2 (two) times daily as needed for heartburn. Use one tablet one to two times daily as directed.    . rosuvastatin (CRESTOR) 40 MG tablet Take 40 mg by mouth daily.    . SUMAtriptan (IMITREX) 50 MG tablet Take 50 mg by mouth every 2 (two) hours as needed for migraine.    . traMADol (ULTRAM) 50 MG tablet Take 1 tablet (50 mg total) by mouth every 6 (six) hours as needed for moderate pain. 30 tablet 0  . TRULICITY 3.57 SV/7.7LT SOPN Inject 0.75 mg into the skin every 7 (seven) days. On Fridays    . zolpidem (AMBIEN) 10 MG tablet Take 5 mg by mouth at bedtime as needed for sleep.    . methocarbamol (ROBAXIN) 500 MG tablet Take 1 tablet (500 mg total) by mouth at bedtime and may repeat dose one time if needed. 30 tablet 0   No current facility-administered medications for this visit.      ROS:  See HPI  Physical Exam:  Vitals:   07/17/16 1420  BP: 107/75  Pulse: (!) 104  Resp: 16  Temp: 98.4 F (36.9 C)    Incision:  Superficial 1 cm wound with SS minimal drainage.  No depth.  No erythema or fluctuants.   Extremities: left LE Doppler signal DP/PT and peroneal, palpable femoral pulses.   Tenderness to palpation in the hamstrings and tendons over the posterior knee.  Very minimal edema.   Assessment/Plan:  This is a 61 y.o. female who is s/p: S/P  left femoral-popliteal bypass with PTFEOn 06/19/2016.   The incisional wound is healing nicely.  She has been propping her left leg up to sleep and states it has been very uncomfortable.  I told her that she can sleep with her leg down, just to prop it up when she is sitting at rest.  I told her she can shower daily and place a band aid over the superficial wound at this time.  I gave a refill on her tramadol and added robaxin 500 mg muscle relaxer 1 QHS PRN.  She has a f/u appointment on 07/24/2016.   Laurence Slate  [facility] PA-C [facility] [phone]  Clinic MD:  Oneida Alar

## 2016-07-18 DIAGNOSIS — E119 Type 2 diabetes mellitus without complications: Secondary | ICD-10-CM | POA: Diagnosis not present

## 2016-07-18 DIAGNOSIS — Z95828 Presence of other vascular implants and grafts: Secondary | ICD-10-CM | POA: Diagnosis not present

## 2016-07-18 DIAGNOSIS — I739 Peripheral vascular disease, unspecified: Secondary | ICD-10-CM | POA: Diagnosis not present

## 2016-07-18 DIAGNOSIS — L732 Hidradenitis suppurativa: Secondary | ICD-10-CM | POA: Diagnosis not present

## 2016-07-18 DIAGNOSIS — R531 Weakness: Secondary | ICD-10-CM | POA: Diagnosis not present

## 2016-07-19 DIAGNOSIS — I739 Peripheral vascular disease, unspecified: Secondary | ICD-10-CM | POA: Diagnosis not present

## 2016-07-19 DIAGNOSIS — E119 Type 2 diabetes mellitus without complications: Secondary | ICD-10-CM | POA: Diagnosis not present

## 2016-07-19 DIAGNOSIS — L732 Hidradenitis suppurativa: Secondary | ICD-10-CM | POA: Diagnosis not present

## 2016-07-19 DIAGNOSIS — Z95828 Presence of other vascular implants and grafts: Secondary | ICD-10-CM | POA: Diagnosis not present

## 2016-07-19 DIAGNOSIS — R531 Weakness: Secondary | ICD-10-CM | POA: Diagnosis not present

## 2016-07-23 DIAGNOSIS — R531 Weakness: Secondary | ICD-10-CM | POA: Diagnosis not present

## 2016-07-23 DIAGNOSIS — I739 Peripheral vascular disease, unspecified: Secondary | ICD-10-CM | POA: Diagnosis not present

## 2016-07-23 DIAGNOSIS — E119 Type 2 diabetes mellitus without complications: Secondary | ICD-10-CM | POA: Diagnosis not present

## 2016-07-23 DIAGNOSIS — Z95828 Presence of other vascular implants and grafts: Secondary | ICD-10-CM | POA: Diagnosis not present

## 2016-07-23 DIAGNOSIS — L732 Hidradenitis suppurativa: Secondary | ICD-10-CM | POA: Diagnosis not present

## 2016-07-24 ENCOUNTER — Ambulatory Visit: Payer: Medicare Other | Admitting: Surgery

## 2016-07-26 DIAGNOSIS — E119 Type 2 diabetes mellitus without complications: Secondary | ICD-10-CM | POA: Diagnosis not present

## 2016-07-26 DIAGNOSIS — L732 Hidradenitis suppurativa: Secondary | ICD-10-CM | POA: Diagnosis not present

## 2016-07-26 DIAGNOSIS — I739 Peripheral vascular disease, unspecified: Secondary | ICD-10-CM | POA: Diagnosis not present

## 2016-07-26 DIAGNOSIS — Z95828 Presence of other vascular implants and grafts: Secondary | ICD-10-CM | POA: Diagnosis not present

## 2016-07-26 DIAGNOSIS — R531 Weakness: Secondary | ICD-10-CM | POA: Diagnosis not present

## 2016-07-29 ENCOUNTER — Ambulatory Visit (INDEPENDENT_AMBULATORY_CARE_PROVIDER_SITE_OTHER): Payer: Self-pay | Admitting: Physician Assistant

## 2016-07-29 VITALS — BP 150/98 | HR 98 | Temp 98.0°F | Resp 16 | Ht 64.0 in | Wt 149.0 lb

## 2016-07-29 DIAGNOSIS — I70212 Atherosclerosis of native arteries of extremities with intermittent claudication, left leg: Secondary | ICD-10-CM

## 2016-07-29 NOTE — Progress Notes (Signed)
POST OPERATIVE OFFICE NOTE    CC:  F/u for surgery  HPI:  This is a 61 y.o. female who is s/p  left femoral-popliteal bypass with PTFEOn 06/19/2016. This was done for lifestyle limiting claudication.  She was seen last week in the clinic by Dr. Trula Slade with complaints of drainage at the above knee incision.  She denies fevers or chills.  Dr. Trula Slade recommended daily dressing changes to the new above-knee popliteal artery incision. I will have home health assist with this. I'm also placing her on Keflex for 10 days. She is here today with complaints of posterior leg pain to the back of the knee. The drainage has completely stopped and the wound is closed with a 1 mm scab.  She still has posterior knee/thigh pain and is not sleeping well.  Allergies  Allergen Reactions  . Sulfa Antibiotics Shortness Of Breath and Palpitations  . 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 Fish Allergy Hives and Swelling    Tongue swelling  . Iodine Swelling  . Metformin And Related Diarrhea  . Adhesive [Tape] Rash    Paper tape is ok  . Shellfish Allergy Swelling and Rash    Tongue swelling    Current Outpatient Prescriptions  Medication Sig Dispense Refill  . albuterol (PROAIR HFA) 108 (90 Base) MCG/ACT inhaler 1-2 inhalations every 4-6 hours as needed for cough or wheeze. 1 Inhaler 1  . ALPRAZolam (XANAX) 0.5 MG tablet Take 0.25-0.5 mg by mouth at bedtime as needed for anxiety or sleep.     Marland Kitchen aspirin EC 81 MG tablet Take 81 mg by mouth daily.    . Azelastine HCl 0.15 % SOLN Place 2 sprays into both nostrils 2 (two) times daily. (Patient taking differently: Place 2 sprays into both nostrils 2 (two) times daily as needed (allergies). ) 30 mL 5  . Calcium Carb-Cholecalciferol (CALCIUM 600 + D PO) Take 1 tablet by mouth daily.    . citalopram (CELEXA) 10 MG tablet Take 10 mg by mouth daily.    . clopidogrel (PLAVIX) 75 MG tablet Take 75 mg by mouth at  bedtime.     . dicyclomine (BENTYL) 20 MG tablet Take 1 tablet (20 mg total) by mouth 3 (three) times daily as needed for spasms.    Marland Kitchen doxycycline (DORYX) 100 MG EC tablet Take 100 mg by mouth daily as needed (cysts).     . EPINEPHrine 0.3 mg/0.3 mL IJ SOAJ injection Inject 0.3 mLs (0.3 mg total) into the muscle once. 1 Device 1  . Fenofibric Acid 105 MG TABS Take 105 mg by mouth daily.     Marland Kitchen ibuprofen (ADVIL,MOTRIN) 800 MG tablet Take 800 mg by mouth every 8 (eight) hours as needed.    . insulin aspart (NOVOLOG) 100 UNIT/ML injection Inject 5-12 Units into the skin 2 (two) times daily before a meal. Per sliding scale    . Insulin Glargine (TOUJEO SOLOSTAR) 300 UNIT/ML SOPN Inject 30 Units into the skin every morning.     . insulin lispro (HUMALOG) 100 UNIT/ML injection Inject 5-12 Units into the skin 2 (two) times daily before a meal. Per sliding scale    . KLOR-CON 10 10 MEQ tablet Take 10 mEq by mouth daily as needed for cramping.    . loratadine (CLARITIN) 10 MG tablet Take 10 mg by mouth daily as needed for allergies.    Marland Kitchen meclizine (ANTIVERT) 25 MG tablet Take 25 mg by  mouth 3 (three) times daily as needed for dizziness.    . methocarbamol (ROBAXIN) 500 MG tablet Take 1 tablet (500 mg total) by mouth at bedtime and may repeat dose one time if needed. 30 tablet 0  . mometasone (NASONEX) 50 MCG/ACT nasal spray Place 2 sprays into the nose 2 (two) times daily as needed (nasal congestion).    . mupirocin cream (BACTROBAN) 2 % Apply 1 application topically 2 (two) times daily as needed (wound care (boils)).     . nitroGLYCERIN (NITRODUR - DOSED IN MG/24 HR) 0.4 mg/hr Place 0.4 mg onto the skin as needed (chest pain). Reported on 07/10/2015    . nitroGLYCERIN (NITROSTAT) 0.4 MG SL tablet Place 1 tablet (0.4 mg total) under the tongue every 5 (five) minutes as needed for chest pain. Maximum 3 doses 25 tablet 12  . olmesartan-hydrochlorothiazide (BENICAR HCT) 20-12.5 MG tablet Take 1 tablet by mouth  daily.    . ONE TOUCH ULTRA TEST test strip     . pantoprazole (PROTONIX) 40 MG tablet TAKE 1 TABLET (40 MG TOTAL) BY MOUTH TWICE DAILY. 90 tablet 2  . ranitidine (ZANTAC) 150 MG tablet Take 1 tablet (150 mg total) by mouth 2 (two) times daily as needed for heartburn. Use one tablet one to two times daily as directed.    . rosuvastatin (CRESTOR) 40 MG tablet Take 40 mg by mouth daily.    . SUMAtriptan (IMITREX) 50 MG tablet Take 50 mg by mouth every 2 (two) hours as needed for migraine.    . traMADol (ULTRAM) 50 MG tablet Take 1 tablet (50 mg total) by mouth every 6 (six) hours as needed for moderate pain. 30 tablet 0  . TRULICITY 2.01 EO/7.1QR SOPN Inject 0.75 mg into the skin every 7 (seven) days. On Fridays    . zolpidem (AMBIEN) 10 MG tablet Take 5 mg by mouth at bedtime as needed for sleep.    . cephALEXin (KEFLEX) 500 MG capsule Take 1 capsule (500 mg total) by mouth 3 (three) times daily. (Patient not taking: Reported on 07/29/2016) 30 capsule 0   No current facility-administered medications for this visit.      ROS:  See HPI  Physical Exam:  Vitals:   07/29/16 1300  BP: (!) 150/98  Pulse: 98  Resp: 16  Temp: 98 F (36.7 C)    Incision:  Well healed incisions, groin and above the knee.  1 mm scab over incisional wound.  No erythema and no drainage. Extremities:  Left LE sensation decreased compared to right, active range of motion intact and strength grossly equal B LE.  Left minimal dependent edema around the top of the sock, non pitting.  Still has tendines posterior knee/thigh.   Doppler PT/DP/Peroneal and above the knee by pass graft.  Assessment/Plan:  This is a 61 y.o. female who is s/p:left femoral-popliteal bypass with PTFEOn 06/19/2016.   Her incisional wound is healed with 1 mm scab at this point she can bath regular and perform regular activities as tolerates.  I suggested she begin an exercise program.  She will f/u with Dr. Trula Slade in 3 months with repeat ABI's  and arterial duplex of the by pass graft.  Theda Sers [facility] PA-C [facility] [phone]  Clinic MD:  Trula Slade

## 2016-07-30 DIAGNOSIS — F5101 Primary insomnia: Secondary | ICD-10-CM | POA: Diagnosis not present

## 2016-07-30 DIAGNOSIS — I739 Peripheral vascular disease, unspecified: Secondary | ICD-10-CM | POA: Diagnosis not present

## 2016-07-30 DIAGNOSIS — R8299 Other abnormal findings in urine: Secondary | ICD-10-CM | POA: Diagnosis not present

## 2016-07-31 DIAGNOSIS — E782 Mixed hyperlipidemia: Secondary | ICD-10-CM | POA: Diagnosis not present

## 2016-07-31 DIAGNOSIS — E1140 Type 2 diabetes mellitus with diabetic neuropathy, unspecified: Secondary | ICD-10-CM | POA: Diagnosis not present

## 2016-07-31 DIAGNOSIS — D519 Vitamin B12 deficiency anemia, unspecified: Secondary | ICD-10-CM | POA: Diagnosis not present

## 2016-07-31 DIAGNOSIS — E559 Vitamin D deficiency, unspecified: Secondary | ICD-10-CM | POA: Diagnosis not present

## 2016-07-31 NOTE — Addendum Note (Signed)
Addended by: Lianne Cure A on: 07/31/2016 09:17 AM   Modules accepted: Orders

## 2016-08-01 DIAGNOSIS — Z95828 Presence of other vascular implants and grafts: Secondary | ICD-10-CM | POA: Diagnosis not present

## 2016-08-01 DIAGNOSIS — I739 Peripheral vascular disease, unspecified: Secondary | ICD-10-CM | POA: Diagnosis not present

## 2016-08-01 DIAGNOSIS — E119 Type 2 diabetes mellitus without complications: Secondary | ICD-10-CM | POA: Diagnosis not present

## 2016-08-01 DIAGNOSIS — R531 Weakness: Secondary | ICD-10-CM | POA: Diagnosis not present

## 2016-08-01 DIAGNOSIS — L732 Hidradenitis suppurativa: Secondary | ICD-10-CM | POA: Diagnosis not present

## 2016-08-05 DIAGNOSIS — Z0000 Encounter for general adult medical examination without abnormal findings: Secondary | ICD-10-CM | POA: Diagnosis not present

## 2016-08-05 DIAGNOSIS — E782 Mixed hyperlipidemia: Secondary | ICD-10-CM | POA: Diagnosis not present

## 2016-08-05 DIAGNOSIS — I10 Essential (primary) hypertension: Secondary | ICD-10-CM | POA: Diagnosis not present

## 2016-08-05 DIAGNOSIS — E1140 Type 2 diabetes mellitus with diabetic neuropathy, unspecified: Secondary | ICD-10-CM | POA: Diagnosis not present

## 2016-08-05 DIAGNOSIS — I739 Peripheral vascular disease, unspecified: Secondary | ICD-10-CM | POA: Diagnosis not present

## 2016-08-05 DIAGNOSIS — R232 Flushing: Secondary | ICD-10-CM | POA: Diagnosis not present

## 2016-10-16 ENCOUNTER — Encounter: Payer: Self-pay | Admitting: Surgery

## 2016-10-28 ENCOUNTER — Ambulatory Visit (INDEPENDENT_AMBULATORY_CARE_PROVIDER_SITE_OTHER)
Admission: RE | Admit: 2016-10-28 | Discharge: 2016-10-28 | Disposition: A | Discharge status: Home / Self Care | Payer: Medicare Other | Source: Ambulatory Visit | Attending: Surgery | Admitting: Surgery

## 2016-10-28 ENCOUNTER — Encounter: Payer: Self-pay | Admitting: Surgery

## 2016-10-28 ENCOUNTER — Ambulatory Visit (HOSPITAL_COMMUNITY)
Admission: RE | Admit: 2016-10-28 | Discharge: 2016-10-28 | Disposition: A | Discharge status: Home / Self Care | Payer: Medicare Other | Source: Ambulatory Visit | Attending: Surgery | Admitting: Surgery

## 2016-10-28 ENCOUNTER — Ambulatory Visit (INDEPENDENT_AMBULATORY_CARE_PROVIDER_SITE_OTHER): Payer: Medicare Other | Admitting: Surgery

## 2016-10-28 VITALS — BP 153/98 | HR 100 | Temp 97.9°F | Resp 16 | Ht 64.0 in | Wt 145.0 lb

## 2016-10-28 DIAGNOSIS — I70212 Atherosclerosis of native arteries of extremities with intermittent claudication, left leg: Secondary | ICD-10-CM

## 2016-10-28 DIAGNOSIS — I739 Peripheral vascular disease, unspecified: Secondary | ICD-10-CM | POA: Diagnosis not present

## 2016-10-28 NOTE — Progress Notes (Signed)
[facility] [place]  Patient name: [name] MRN: [id number] DOB: 04/04/1956 Sex: female  REASON FOR VISIT: follow-up  HPI: [name] is a 61 y.o. female who presents for follow-up s/p left femoral to popliteal bypass with PTFE on 06/09/2016. This was done for lifestyle limiting claudication. Her wounds have now healed. She denies any further claudication symptoms. She is complaining of burning and warm sensation to the left above-knee incision. She is staying active and is wanting to start walking more. She is not smoking, but is using vaporizers.   She has some claudication with the right leg but it is minimal and tolerable.  She takes a daily aspirin and statin for hypercholesterolemia. Has past medical history of CAD and CVA.   Past Medical History:  Diagnosis Date  . 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 Anemia    hx  . Angioedema 07/10/2015  . Anxiety   . Arthritis    "neck, left hand" (09/14/2012)  . Asthma   . Cancer ([place]) 1985   ovarian, no treatment except surgery  . Complication of anesthesia    OCCASIONAL TROUBLE TURNING NECK TO RIGHT  . Critical lower limb ischemia    10/2014 s/p L SFA stenting  . Diverticulosis of colon with hemorrhage April 2013  . GERD (gastroesophageal reflux disease)   . H/O hiatal hernia   . Heart attack [facility])    2003 mild MI, March 2013 mild MI  . Hidradenitis    groin  . History of blood transfusion 1985 AND 2013  . Hyperlipidemia   . Hypertension   . Irritable bowel syndrome   . Left-sided weakness    since stroke, left eye trouble seeing  . Migraines   . Mild CAD    a. Cath 09/2010: mild luminal irregularities of LAD, 30% prox RCA and 20-30% mRCA, EF 65%.  . Neuropathy   . Obesity   . PAD (peripheral artery disease) ([place])    a. critical limb ischemia s/p PTA/stenting of L SFA 10/2014. c. occ prior SFA stent by angio 01/2016, for possible PV bypass.  . Pneumonia    baby  . Recurrent upper respiratory  infection (URI)   . S/P arterial stent-mid Lt SFA 11/03/14 11/04/2014  . Schatzki's ring   . Sinus problem   . Stomach ulcer 1972   non-bleeding  . Stroke ([place]) 07-2007, 07-2008, 07-2009   total 3 strokes; mild left sided weakness and left eye "jumps".  . Type II diabetes mellitus ([place])   . Vitamin B 12 deficiency 07-15-2013    Family History  Problem Relation Age of Onset  . Breast cancer Mother   . Heart disease Mother   . Hypertension Mother   . Diabetes Father   . Heart disease Father   . Stroke Father   . Heart disease Sister   . Hypertension Sister   . Hypertension Sister   . Hyperlipidemia Sister   . Diabetes Sister   . Allergic rhinitis Sister   . Emphysema Unknown        great uncle  . Aneurysm Sister        brain  . Angioedema Neg Hx   . Asthma Neg Hx   . Eczema Neg Hx   . Immunodeficiency Neg Hx   . Urticaria Neg Hx     SOCIAL HISTORY: Social History  Substance Use Topics  . Smoking status: Former Smoker    Packs/day: 0.33    Years: 41.00  Types: Cigarettes    Quit date: 01/15/2016  . Smokeless tobacco: Never Used     Comment: Getting ready to start nicotine patches RX by Dr. Gwenlyn Found per pt.  . Alcohol use No    Allergies  Allergen Reactions  . Sulfa Antibiotics Shortness Of Breath and Palpitations  . 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 Fish Allergy Hives and Swelling    Tongue swelling  . Iodine Swelling  . Metformin And Related Diarrhea  . Adhesive [Tape] Rash    Paper tape is ok  . Shellfish Allergy Swelling and Rash    Tongue swelling    Current Outpatient Prescriptions  Medication Sig Dispense Refill  . albuterol (PROAIR HFA) 108 (90 Base) MCG/ACT inhaler 1-2 inhalations every 4-6 hours as needed for cough or wheeze. 1 Inhaler 1  . ALPRAZolam (XANAX) 0.5 MG tablet Take 0.25-0.5 mg by mouth at bedtime as needed for anxiety or sleep.     Marland Kitchen aspirin EC 81 MG tablet Take 81 mg by mouth daily.      . Azelastine HCl 0.15 % SOLN Place 2 sprays into both nostrils 2 (two) times daily. (Patient taking differently: Place 2 sprays into both nostrils 2 (two) times daily as needed (allergies). ) 30 mL 5  . Calcium Carb-Cholecalciferol (CALCIUM 600 + D PO) Take 1 tablet by mouth daily.    . citalopram (CELEXA) 10 MG tablet Take 10 mg by mouth daily.    . clopidogrel (PLAVIX) 75 MG tablet Take 75 mg by mouth at bedtime.     . cyclobenzaprine (FLEXERIL) 10 MG tablet Take 10 mg by mouth 3 (three) times daily as needed for muscle spasms.    Marland Kitchen dicyclomine (BENTYL) 20 MG tablet Take 1 tablet (20 mg total) by mouth 3 (three) times daily as needed for spasms.    Marland Kitchen EPINEPHrine 0.3 mg/0.3 mL IJ SOAJ injection Inject 0.3 mLs (0.3 mg total) into the muscle once. 1 Device 1  . Fenofibric Acid 105 MG TABS Take 105 mg by mouth daily.     Marland Kitchen ibuprofen (ADVIL,MOTRIN) 800 MG tablet Take 800 mg by mouth every 8 (eight) hours as needed.    . insulin aspart (NOVOLOG) 100 UNIT/ML injection Inject 5-12 Units into the skin 2 (two) times daily before a meal. Per sliding scale    . Insulin Glargine (TOUJEO SOLOSTAR) 300 UNIT/ML SOPN Inject 30 Units into the skin every morning.     . insulin lispro (HUMALOG) 100 UNIT/ML injection Inject 5-12 Units into the skin 2 (two) times daily before a meal. Per sliding scale    . KLOR-CON 10 10 MEQ tablet Take 10 mEq by mouth daily as needed for cramping.    . loratadine (CLARITIN) 10 MG tablet Take 10 mg by mouth daily as needed for allergies.    Marland Kitchen meclizine (ANTIVERT) 25 MG tablet Take 25 mg by mouth 3 (three) times daily as needed for dizziness.    . mometasone (NASONEX) 50 MCG/ACT nasal spray Place 2 sprays into the nose 2 (two) times daily as needed (nasal congestion).    . mupirocin cream (BACTROBAN) 2 % Apply 1 application topically 2 (two) times daily as needed (wound care (boils)).     . nitroGLYCERIN (NITRODUR - DOSED IN MG/24 HR) 0.4 mg/hr Place 0.4 mg onto the skin as needed  (chest pain). Reported on 07/10/2015    . nitroGLYCERIN (NITROSTAT) 0.4 MG SL tablet Place 1 tablet (0.4  mg total) under the tongue every 5 (five) minutes as needed for chest pain. Maximum 3 doses 25 tablet 12  . olmesartan-hydrochlorothiazide (BENICAR HCT) 20-12.5 MG tablet Take 1 tablet by mouth daily.    . ONE TOUCH ULTRA TEST test strip     . pantoprazole (PROTONIX) 40 MG tablet TAKE 1 TABLET (40 MG TOTAL) BY MOUTH TWICE DAILY. 90 tablet 2  . ranitidine (ZANTAC) 150 MG tablet Take 1 tablet (150 mg total) by mouth 2 (two) times daily as needed for heartburn. Use one tablet one to two times daily as directed.    . rosuvastatin (CRESTOR) 40 MG tablet Take 40 mg by mouth daily.    . SUMAtriptan (IMITREX) 50 MG tablet Take 50 mg by mouth every 2 (two) hours as needed for migraine.    . traMADol (ULTRAM) 50 MG tablet Take 1 tablet (50 mg total) by mouth every 6 (six) hours as needed for moderate pain. 30 tablet 0  . TRULICITY 1.19 ER/7.4YC SOPN Inject 0.75 mg into the skin every 7 (seven) days. On Fridays     No current facility-administered medications for this visit.     REVIEW OF SYSTEMS:  [X]  denotes positive finding, [ ]  denotes negative finding Cardiac  Comments:  Chest pain or chest pressure:    Shortness of breath upon exertion:    Short of breath when lying flat:    Irregular heart rhythm:        Vascular    Pain in calf, thigh, or hip brought on by ambulation:    Pain in feet at night that wakes you up from your sleep:     Blood clot in your veins:    Leg swelling:         Pulmonary    Oxygen at home:    Productive cough:     Wheezing:         Neurologic    Sudden weakness in arms or legs:     Sudden numbness in arms or legs:     Sudden onset of difficulty speaking or slurred speech:    Temporary loss of vision in one eye:     Problems with dizziness:         Gastrointestinal    Blood in stool:     Vomited blood:         Genitourinary    Burning when urinating:       Blood in urine:        Psychiatric    Major depression:         Hematologic    Bleeding problems:    Problems with blood clotting too easily:        Skin    Rashes or ulcers:        Constitutional    Fever or chills:      PHYSICAL EXAM: Vitals:   10/28/16 1617 10/28/16 1624  BP: (!) 147/100 (!) 153/98  Pulse: 100   Resp: 16   Temp: 97.9 F (36.6 C)   TempSrc: Oral   SpO2: 96%   Weight: 145 lb (65.8 kg)   Height: 5\' 4"  (1.626 m)     GENERAL: The patient is a well-nourished female, in no acute distress. The vital signs are documented above. HEENT: normocephalic, atraumatic. No abnormalities noted.  CARDIAC: There is a regular rate and rhythm. No carotid bruits.  VASCULAR: Left groin and above knee incisions healed. Left foot is warm with strong DP and PT signals.  PULMONARY: There is good air exchange bilaterally without wheezing or rales. MUSCULOSKELETAL: There are no major deformities or cyanosis. NEUROLOGIC: No focal weakness or paresthesias are detected. SKIN: There are no ulcers or rashes noted. PSYCHIATRIC: The patient has a normal affect.  DATA:  Lower extremity arterial duplex and ABIs 10/28/16  Left fem-above knee pop bypass patent with triphasic waveforms throughout. PSV 72 cm/s proximal graft, 47 cm/s mid graft and 46 cm/s distal graft.   R: 1.05 L: 0.88  MEDICAL ISSUES: Status post left femoral to above knee popliteal bypass with PTFE  Left leg bypass is patent. She is having some neuropathic pain around her left above knee incision. Discussed that this is from irritation of the nerves during surgery. Hopefully this will continue to improve with time. Did discuss that this may become chronic. She has tolerable claudication on the right. She is on maximal medical management with aspirin, plavix and a statin. Discussed discontinuing use of vaporizer cigarettes. She may follow up with [place] in 3 months with ABIs and bypass graft duplex or with Dr. Gwenlyn Found who is  her main cardiology. She will coordinate this.   Virgina Jock, PA-C [facility] MD: Trula Slade

## 2016-10-30 ENCOUNTER — Other Ambulatory Visit: Payer: Self-pay | Admitting: Cardiology

## 2016-11-01 DIAGNOSIS — R51 Headache: Secondary | ICD-10-CM | POA: Diagnosis not present

## 2016-11-01 DIAGNOSIS — I10 Essential (primary) hypertension: Secondary | ICD-10-CM | POA: Diagnosis not present

## 2016-11-15 DIAGNOSIS — R51 Headache: Secondary | ICD-10-CM | POA: Diagnosis not present

## 2016-11-15 DIAGNOSIS — I10 Essential (primary) hypertension: Secondary | ICD-10-CM | POA: Diagnosis not present

## 2016-12-11 DIAGNOSIS — D519 Vitamin B12 deficiency anemia, unspecified: Secondary | ICD-10-CM | POA: Diagnosis not present

## 2016-12-11 DIAGNOSIS — I10 Essential (primary) hypertension: Secondary | ICD-10-CM | POA: Diagnosis not present

## 2016-12-11 DIAGNOSIS — E559 Vitamin D deficiency, unspecified: Secondary | ICD-10-CM | POA: Diagnosis not present

## 2016-12-11 DIAGNOSIS — E1140 Type 2 diabetes mellitus with diabetic neuropathy, unspecified: Secondary | ICD-10-CM | POA: Diagnosis not present

## 2016-12-13 DIAGNOSIS — E1140 Type 2 diabetes mellitus with diabetic neuropathy, unspecified: Secondary | ICD-10-CM | POA: Diagnosis not present

## 2016-12-13 DIAGNOSIS — E782 Mixed hyperlipidemia: Secondary | ICD-10-CM | POA: Diagnosis not present

## 2016-12-13 DIAGNOSIS — I739 Peripheral vascular disease, unspecified: Secondary | ICD-10-CM | POA: Diagnosis not present

## 2016-12-13 DIAGNOSIS — F5101 Primary insomnia: Secondary | ICD-10-CM | POA: Diagnosis not present

## 2016-12-13 DIAGNOSIS — I10 Essential (primary) hypertension: Secondary | ICD-10-CM | POA: Diagnosis not present

## 2016-12-24 ENCOUNTER — Emergency Department (HOSPITAL_COMMUNITY): Payer: Medicare Other

## 2016-12-24 ENCOUNTER — Telehealth: Payer: Self-pay | Admitting: *Deleted

## 2016-12-24 ENCOUNTER — Encounter (HOSPITAL_COMMUNITY): Payer: Self-pay | Admitting: Emergency Medicine

## 2016-12-24 ENCOUNTER — Telehealth: Payer: Self-pay | Admitting: Cardiovascular Disease

## 2016-12-24 ENCOUNTER — Observation Stay (HOSPITAL_COMMUNITY)
Admission: EM | Admit: 2016-12-24 | Discharge: 2016-12-28 | Disposition: A | Discharge status: Home / Self Care | Payer: Medicare Other | Attending: Cardiovascular Disease | Admitting: Cardiovascular Disease

## 2016-12-24 DIAGNOSIS — Z7982 Long term (current) use of aspirin: Secondary | ICD-10-CM | POA: Insufficient documentation

## 2016-12-24 DIAGNOSIS — G43909 Migraine, unspecified, not intractable, without status migrainosus: Secondary | ICD-10-CM | POA: Diagnosis not present

## 2016-12-24 DIAGNOSIS — M1990 Unspecified osteoarthritis, unspecified site: Secondary | ICD-10-CM | POA: Insufficient documentation

## 2016-12-24 DIAGNOSIS — E1151 Type 2 diabetes mellitus with diabetic peripheral angiopathy without gangrene: Secondary | ICD-10-CM | POA: Diagnosis not present

## 2016-12-24 DIAGNOSIS — E1122 Type 2 diabetes mellitus with diabetic chronic kidney disease: Secondary | ICD-10-CM | POA: Diagnosis not present

## 2016-12-24 DIAGNOSIS — Z8719 Personal history of other diseases of the digestive system: Secondary | ICD-10-CM | POA: Insufficient documentation

## 2016-12-24 DIAGNOSIS — Z882 Allergy status to sulfonamides status: Secondary | ICD-10-CM | POA: Diagnosis not present

## 2016-12-24 DIAGNOSIS — R9439 Abnormal result of other cardiovascular function study: Secondary | ICD-10-CM | POA: Insufficient documentation

## 2016-12-24 DIAGNOSIS — R109 Unspecified abdominal pain: Secondary | ICD-10-CM

## 2016-12-24 DIAGNOSIS — Z888 Allergy status to other drugs, medicaments and biological substances status: Secondary | ICD-10-CM | POA: Diagnosis not present

## 2016-12-24 DIAGNOSIS — E669 Obesity, unspecified: Secondary | ICD-10-CM | POA: Insufficient documentation

## 2016-12-24 DIAGNOSIS — Z823 Family history of stroke: Secondary | ICD-10-CM | POA: Insufficient documentation

## 2016-12-24 DIAGNOSIS — Z8543 Personal history of malignant neoplasm of ovary: Secondary | ICD-10-CM | POA: Insufficient documentation

## 2016-12-24 DIAGNOSIS — Z91013 Allergy to seafood: Secondary | ICD-10-CM | POA: Diagnosis not present

## 2016-12-24 DIAGNOSIS — Z794 Long term (current) use of insulin: Secondary | ICD-10-CM | POA: Diagnosis not present

## 2016-12-24 DIAGNOSIS — I2510 Atherosclerotic heart disease of native coronary artery without angina pectoris: Principal | ICD-10-CM | POA: Insufficient documentation

## 2016-12-24 DIAGNOSIS — N183 Chronic kidney disease, stage 3 (moderate): Secondary | ICD-10-CM | POA: Insufficient documentation

## 2016-12-24 DIAGNOSIS — N179 Acute kidney failure, unspecified: Secondary | ICD-10-CM | POA: Diagnosis present

## 2016-12-24 DIAGNOSIS — I10 Essential (primary) hypertension: Secondary | ICD-10-CM | POA: Diagnosis not present

## 2016-12-24 DIAGNOSIS — I252 Old myocardial infarction: Secondary | ICD-10-CM | POA: Diagnosis not present

## 2016-12-24 DIAGNOSIS — I129 Hypertensive chronic kidney disease with stage 1 through stage 4 chronic kidney disease, or unspecified chronic kidney disease: Secondary | ICD-10-CM | POA: Diagnosis not present

## 2016-12-24 DIAGNOSIS — Z885 Allergy status to narcotic agent status: Secondary | ICD-10-CM | POA: Insufficient documentation

## 2016-12-24 DIAGNOSIS — Z833 Family history of diabetes mellitus: Secondary | ICD-10-CM | POA: Insufficient documentation

## 2016-12-24 DIAGNOSIS — Z9049 Acquired absence of other specified parts of digestive tract: Secondary | ICD-10-CM | POA: Insufficient documentation

## 2016-12-24 DIAGNOSIS — E785 Hyperlipidemia, unspecified: Secondary | ICD-10-CM | POA: Diagnosis not present

## 2016-12-24 DIAGNOSIS — Z91048 Other nonmedicinal substance allergy status: Secondary | ICD-10-CM | POA: Diagnosis not present

## 2016-12-24 DIAGNOSIS — Z95828 Presence of other vascular implants and grafts: Secondary | ICD-10-CM | POA: Diagnosis not present

## 2016-12-24 DIAGNOSIS — R0602 Shortness of breath: Secondary | ICD-10-CM | POA: Diagnosis not present

## 2016-12-24 DIAGNOSIS — R072 Precordial pain: Secondary | ICD-10-CM

## 2016-12-24 DIAGNOSIS — E119 Type 2 diabetes mellitus without complications: Secondary | ICD-10-CM

## 2016-12-24 DIAGNOSIS — E876 Hypokalemia: Secondary | ICD-10-CM | POA: Insufficient documentation

## 2016-12-24 DIAGNOSIS — Z87891 Personal history of nicotine dependence: Secondary | ICD-10-CM | POA: Insufficient documentation

## 2016-12-24 DIAGNOSIS — Z825 Family history of asthma and other chronic lower respiratory diseases: Secondary | ICD-10-CM | POA: Insufficient documentation

## 2016-12-24 DIAGNOSIS — I700 Atherosclerosis of aorta: Secondary | ICD-10-CM | POA: Insufficient documentation

## 2016-12-24 DIAGNOSIS — Z981 Arthrodesis status: Secondary | ICD-10-CM | POA: Insufficient documentation

## 2016-12-24 DIAGNOSIS — K589 Irritable bowel syndrome without diarrhea: Secondary | ICD-10-CM | POA: Insufficient documentation

## 2016-12-24 DIAGNOSIS — I69354 Hemiplegia and hemiparesis following cerebral infarction affecting left non-dominant side: Secondary | ICD-10-CM | POA: Insufficient documentation

## 2016-12-24 DIAGNOSIS — Z8711 Personal history of peptic ulcer disease: Secondary | ICD-10-CM | POA: Diagnosis not present

## 2016-12-24 DIAGNOSIS — Z6825 Body mass index (BMI) 25.0-25.9, adult: Secondary | ICD-10-CM | POA: Insufficient documentation

## 2016-12-24 DIAGNOSIS — Z9842 Cataract extraction status, left eye: Secondary | ICD-10-CM | POA: Insufficient documentation

## 2016-12-24 DIAGNOSIS — Z8249 Family history of ischemic heart disease and other diseases of the circulatory system: Secondary | ICD-10-CM | POA: Insufficient documentation

## 2016-12-24 DIAGNOSIS — I739 Peripheral vascular disease, unspecified: Secondary | ICD-10-CM | POA: Diagnosis not present

## 2016-12-24 DIAGNOSIS — J45909 Unspecified asthma, uncomplicated: Secondary | ICD-10-CM | POA: Insufficient documentation

## 2016-12-24 DIAGNOSIS — Z9841 Cataract extraction status, right eye: Secondary | ICD-10-CM | POA: Insufficient documentation

## 2016-12-24 DIAGNOSIS — Z90710 Acquired absence of both cervix and uterus: Secondary | ICD-10-CM | POA: Insufficient documentation

## 2016-12-24 DIAGNOSIS — R079 Chest pain, unspecified: Secondary | ICD-10-CM | POA: Diagnosis not present

## 2016-12-24 DIAGNOSIS — Z7902 Long term (current) use of antithrombotics/antiplatelets: Secondary | ICD-10-CM | POA: Insufficient documentation

## 2016-12-24 DIAGNOSIS — R112 Nausea with vomiting, unspecified: Secondary | ICD-10-CM | POA: Diagnosis not present

## 2016-12-24 DIAGNOSIS — Z79899 Other long term (current) drug therapy: Secondary | ICD-10-CM | POA: Insufficient documentation

## 2016-12-24 DIAGNOSIS — Z803 Family history of malignant neoplasm of breast: Secondary | ICD-10-CM | POA: Insufficient documentation

## 2016-12-24 LAB — TROPONIN I: Troponin I: <0.03 ng/mL (ref <0.03)

## 2016-12-24 LAB — CBC
HCT: 40.6 % (ref 36.0–46.0)
Hemoglobin: 13.2 g/dL (ref 12.0–15.0)
MCH: 26.7 pg (ref 26.0–34.0)
MCHC: 32.5 g/dL (ref 30.0–36.0)
MCV: 82 fL (ref 78.0–100.0)
Platelets: 218 10*3/uL (ref 150–400)
RBC: 4.95 MIL/uL (ref 3.87–5.11)
RDW: 13.7 % (ref 11.5–15.5)
WBC: 5.5 10*3/uL (ref 4.0–10.5)

## 2016-12-24 LAB — BASIC METABOLIC PANEL
Anion gap: 11 (ref 5–15)
BUN: 24 mg/dL — ABNORMAL HIGH (ref 6–20)
CO2: 29 mmol/L (ref 22–32)
Calcium: 10.6 mg/dL — ABNORMAL HIGH (ref 8.9–10.3)
Chloride: 99 mmol/L — ABNORMAL LOW (ref 101–111)
Creatinine, Ser: 1.89 mg/dL — ABNORMAL HIGH (ref 0.44–1.00)
GFR calc Af Amer: 32 mL/min — ABNORMAL LOW (ref >60)
GFR calc non Af Amer: 28 mL/min — ABNORMAL LOW (ref >60)
Glucose, Bld: 164 mg/dL — ABNORMAL HIGH (ref 65–99)
Potassium: 3.4 mmol/L — ABNORMAL LOW (ref 3.5–5.1)
Sodium: 139 mmol/L (ref 135–145)

## 2016-12-24 LAB — GLUCOSE, CAPILLARY: Glucose-Capillary: 104 mg/dL — ABNORMAL HIGH (ref 65–99)

## 2016-12-24 LAB — I-STAT TROPONIN, ED: Troponin i, poc: 0 ng/mL (ref 0.00–0.08)

## 2016-12-24 MED ORDER — ACETAMINOPHEN 500 MG PO TABS
1000.0000 mg | ORAL_TABLET | Freq: Once | ORAL | Status: AC
  Administered 2016-12-24: 1000 mg via ORAL
  Filled 2016-12-24: qty 2

## 2016-12-24 MED ORDER — PROPRANOLOL HCL ER 60 MG PO CP24
60.0000 mg | ORAL_CAPSULE | Freq: Every evening | ORAL | Status: DC
  Administered 2016-12-25 – 2016-12-27 (×2): 60 mg via ORAL
  Filled 2016-12-24 (×4): qty 1

## 2016-12-24 MED ORDER — CITALOPRAM HYDROBROMIDE 10 MG PO TABS
10.0000 mg | ORAL_TABLET | Freq: Every day | ORAL | Status: DC
  Administered 2016-12-25 – 2016-12-28 (×4): 10 mg via ORAL
  Filled 2016-12-24 (×4): qty 1

## 2016-12-24 MED ORDER — PANTOPRAZOLE SODIUM 40 MG PO TBEC
40.0000 mg | DELAYED_RELEASE_TABLET | Freq: Every day | ORAL | Status: DC
  Administered 2016-12-26 – 2016-12-28 (×3): 40 mg via ORAL
  Filled 2016-12-24 (×4): qty 1

## 2016-12-24 MED ORDER — SODIUM CHLORIDE 0.9 % IV SOLN
INTRAVENOUS | Status: DC
  Administered 2016-12-25 – 2016-12-26 (×3): via INTRAVENOUS

## 2016-12-24 MED ORDER — ASPIRIN EC 81 MG PO TBEC
81.0000 mg | DELAYED_RELEASE_TABLET | Freq: Every day | ORAL | Status: DC
  Administered 2016-12-25 – 2016-12-28 (×4): 81 mg via ORAL
  Filled 2016-12-24 (×4): qty 1

## 2016-12-24 MED ORDER — ENOXAPARIN SODIUM 40 MG/0.4ML ~~LOC~~ SOLN
40.0000 mg | SUBCUTANEOUS | Status: DC
  Administered 2016-12-25: 40 mg via SUBCUTANEOUS
  Filled 2016-12-24: qty 0.4

## 2016-12-24 MED ORDER — DULAGLUTIDE 0.75 MG/0.5ML ~~LOC~~ SOAJ: 0.7500 mg | SUBCUTANEOUS | Status: DC

## 2016-12-24 MED ORDER — GI COCKTAIL ~~LOC~~
30.0000 mL | Freq: Once | ORAL | Status: AC
  Administered 2016-12-24: 30 mL via ORAL
  Filled 2016-12-24: qty 30

## 2016-12-24 MED ORDER — POTASSIUM CHLORIDE CRYS ER 10 MEQ PO TBCR
10.0000 meq | EXTENDED_RELEASE_TABLET | Freq: Every day | ORAL | Status: DC
  Administered 2016-12-25: 10 meq via ORAL
  Filled 2016-12-24 (×3): qty 1

## 2016-12-24 MED ORDER — SODIUM CHLORIDE 0.9 % IV BOLUS (SEPSIS)
500.0000 mL | Freq: Once | INTRAVENOUS | Status: AC
  Administered 2016-12-25: 500 mL via INTRAVENOUS

## 2016-12-24 MED ORDER — SODIUM CHLORIDE 0.9 % IV BOLUS (SEPSIS)
500.0000 mL | Freq: Once | INTRAVENOUS | Status: AC
  Administered 2016-12-24: 500 mL via INTRAVENOUS

## 2016-12-24 MED ORDER — ACETAMINOPHEN 325 MG PO TABS
650.0000 mg | ORAL_TABLET | ORAL | Status: DC | PRN
  Administered 2016-12-26 – 2016-12-27 (×2): 650 mg via ORAL
  Filled 2016-12-24 (×3): qty 2

## 2016-12-24 MED ORDER — ASPIRIN 325 MG PO TABS
325.0000 mg | ORAL_TABLET | Freq: Once | ORAL | Status: AC
  Administered 2016-12-24: 325 mg via ORAL
  Filled 2016-12-24: qty 1

## 2016-12-24 MED ORDER — FENTANYL CITRATE (PF) 100 MCG/2ML IJ SOLN
50.0000 ug | Freq: Once | INTRAMUSCULAR | Status: AC
  Administered 2016-12-24: 50 ug via INTRAVENOUS
  Filled 2016-12-24: qty 2

## 2016-12-24 MED ORDER — ONDANSETRON HCL 4 MG/2ML IJ SOLN
4.0000 mg | Freq: Four times a day (QID) | INTRAMUSCULAR | Status: DC | PRN
  Administered 2016-12-25 – 2016-12-26 (×4): 4 mg via INTRAVENOUS
  Filled 2016-12-24 (×4): qty 2

## 2016-12-24 MED ORDER — INSULIN GLARGINE 100 UNIT/ML ~~LOC~~ SOLN
37.0000 [IU] | Freq: Every morning | SUBCUTANEOUS | Status: DC
  Administered 2016-12-25: 37 [IU] via SUBCUTANEOUS
  Filled 2016-12-24 (×3): qty 0.37

## 2016-12-24 MED ORDER — INSULIN ASPART 100 UNIT/ML ~~LOC~~ SOLN
5.0000 [IU] | Freq: Two times a day (BID) | SUBCUTANEOUS | Status: DC
  Administered 2016-12-25: 8 [IU] via SUBCUTANEOUS
  Administered 2016-12-27 (×2): 5 [IU] via SUBCUTANEOUS
  Administered 2016-12-28: 8 [IU] via SUBCUTANEOUS

## 2016-12-24 MED ORDER — CLOPIDOGREL BISULFATE 75 MG PO TABS
75.0000 mg | ORAL_TABLET | Freq: Every day | ORAL | Status: DC
Start: 2016-12-24 — End: 2016-12-28
  Administered 2016-12-25 – 2016-12-27 (×3): 75 mg via ORAL
  Filled 2016-12-24 (×3): qty 1

## 2016-12-24 MED ORDER — MORPHINE SULFATE (PF) 2 MG/ML IV SOLN
2.0000 mg | INTRAVENOUS | Status: DC | PRN
  Administered 2016-12-25 – 2016-12-26 (×4): 2 mg via INTRAVENOUS
  Filled 2016-12-24 (×4): qty 1

## 2016-12-24 MED ORDER — CYCLOBENZAPRINE HCL 10 MG PO TABS
10.0000 mg | ORAL_TABLET | Freq: Three times a day (TID) | ORAL | Status: DC | PRN
  Administered 2016-12-27: 10 mg via ORAL
  Filled 2016-12-24: qty 1

## 2016-12-24 MED ORDER — ALPRAZOLAM 0.25 MG PO TABS
0.2500 mg | ORAL_TABLET | Freq: Every day | ORAL | Status: DC | PRN
  Administered 2016-12-25 – 2016-12-27 (×3): 0.25 mg via ORAL
  Filled 2016-12-24 (×3): qty 1

## 2016-12-24 MED ORDER — ROSUVASTATIN CALCIUM 40 MG PO TABS
40.0000 mg | ORAL_TABLET | Freq: Every day | ORAL | Status: DC
  Administered 2016-12-25 – 2016-12-28 (×4): 40 mg via ORAL
  Filled 2016-12-24: qty 2
  Filled 2016-12-24 (×2): qty 1
  Filled 2016-12-24: qty 2

## 2016-12-24 MED ORDER — PANTOPRAZOLE SODIUM 40 MG IV SOLR
40.0000 mg | Freq: Two times a day (BID) | INTRAVENOUS | Status: DC
  Administered 2016-12-24 – 2016-12-25 (×3): 40 mg via INTRAVENOUS
  Filled 2016-12-24 (×3): qty 40

## 2016-12-24 MED ORDER — GI COCKTAIL ~~LOC~~
30.0000 mL | Freq: Four times a day (QID) | ORAL | Status: DC | PRN
  Administered 2016-12-25 (×2): 30 mL via ORAL
  Filled 2016-12-24 (×2): qty 30

## 2016-12-24 MED ORDER — FENOFIBRATE 54 MG PO TABS
105.0000 mg | ORAL_TABLET | Freq: Every day | ORAL | Status: DC
  Administered 2016-12-25 – 2016-12-28 (×4): 108 mg via ORAL
  Filled 2016-12-24 (×5): qty 2

## 2016-12-24 NOTE — ED Provider Notes (Signed)
[facility] DEPT Provider Note   CSN: [id number] Arrival date & time: 12/24/16  1444     History   Chief Complaint Chief Complaint  Patient presents with  . Chest Pain    HPI [name] is a 61 y.o. female.  The history is provided by the patient.  Chest Pain   This is a new problem. The current episode started 3 to 5 hours ago. The problem occurs constantly. The problem has been gradually improving. The pain is associated with rest. The pain is present in the substernal region. The pain is moderate. The quality of the pain is described as stabbing. The pain does not radiate. Associated symptoms include diaphoresis, nausea and shortness of breath. Pertinent negatives include no abdominal pain, no fever, no near-syncope, no numbness, no syncope and no vomiting. She has tried nitroglycerin for the symptoms. The treatment provided no relief.      61 year old female who presents with chest pain. She has a history of CAD, peripheral vascular disease status post left femoral to popliteal artery bypass and stenting to the left SFA, hypertension, hyperlipidemia, diabetes. Follows with Dr. Gwenlyn Found from cardiology.states that she had sudden onset of bilateral chest pain at 11:15 at rest. It was associated with diaphoresis, dry dry heaving, and episode of diarrhea. States that she did feel short of breath and a little lightheaded but did not have syncope.to take nitroglycerin, without significant relief. States that pain gradually has been easing off, and she endorses mild residual pain currently.denies any significant leg swelling or leg pain, focal numbness or weakness, confusion, recent illnesses. States that she occasionally does have chest pain that comes and goes, that often goes away with nitroglycerin, but today's symptoms were much more severe and different.   Past Medical History:  Diagnosis Date  . 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 Anemia    hx  . Angioedema 07/10/2015  . Anxiety     . Arthritis    "neck, left hand" (09/14/2012)  . Asthma   . Cancer ([place]) 1985   ovarian, no treatment except surgery  . Complication of anesthesia    OCCASIONAL TROUBLE TURNING NECK TO RIGHT  . Critical lower limb ischemia    10/2014 s/p L SFA stenting  . Diverticulosis of colon with hemorrhage April 2013  . GERD (gastroesophageal reflux disease)   . H/O hiatal hernia   . Heart attack [facility])    2003 mild MI, March 2013 mild MI  . Hidradenitis    groin  . History of blood transfusion 1985 AND 2013  . Hyperlipidemia   . Hypertension   . Irritable bowel syndrome   . Left-sided weakness    since stroke, left eye trouble seeing  . Migraines   . Mild CAD    a. Cath 09/2010: mild luminal irregularities of LAD, 30% prox RCA and 20-30% mRCA, EF 65%.  . Neuropathy   . Obesity   . PAD (peripheral artery disease) ([place])    a. critical limb ischemia s/p PTA/stenting of L SFA 10/2014. c. occ prior SFA stent by angio 01/2016, for possible PV bypass.  . Pneumonia    baby  . Recurrent upper respiratory infection (URI)   . S/P arterial stent-mid Lt SFA 11/03/14 11/04/2014  . Schatzki's ring   . Sinus problem   . Stomach ulcer 1972   non-bleeding  . Stroke ([place]) 07-2007, 07-2008, 07-2009   total 3 strokes; mild left sided weakness and left eye "jumps".  . Type II diabetes mellitus ([place])   .  Vitamin B 12 deficiency 07-15-2013    Patient Active Problem List   Diagnosis Date Noted  . PAD (peripheral artery disease) ([place]) 06/19/2016  . Diabetic neuropathy ([place]) 01/31/2016  . Obesity 01/31/2016  . Mild CAD   . Claudication ([place]) 01/29/2016  . Food allergy 08/02/2015  . Recurrent urticaria 07/10/2015  . Angioedema 07/10/2015  . Perennial allergic rhinitis with a possible non-allergic component 07/10/2015  . History of asthma 07/10/2015  . S/P arterial stent-mid Lt SFA 11/03/14 11/04/2014  . Critical lower limb ischemia 11/03/2014  . Peripheral arterial disease ([place]) 11/02/2014  . HNP (herniated  nucleus pulposus) with myelopathy, cervical 04/11/2014  . HNP (herniated nucleus pulposus), cervical 04/08/2014  . Pancreatitis 01/02/2013  . Abdominal pain, epigastric 12/14/2012  . IBS (irritable bowel syndrome) 09/12/2011  . GERD (gastroesophageal reflux disease) 09/12/2011  . Diverticulosis of colon with hemorrhage 08/10/2011  . Chest pain at rest 07/06/2011    Class: Acute  . Hidradenitis, left pubic area and left lower quadrant 01/18/2011  . DM2 (diabetes mellitus, type 2) ([place]) 08/24/2007  . Hyperlipidemia LDL goal <70 08/24/2007  . Essential hypertension 08/24/2007    Past Surgical History:  Procedure Laterality Date  . ABDOMINAL HYSTERECTOMY  1985  . ADENOIDECTOMY    . ANTERIOR CERVICAL DECOMP/DISCECTOMY FUSION  2002  . ANTERIOR CERVICAL DECOMP/DISCECTOMY FUSION N/A 04/08/2014   Procedure: Cervical Six-Seven ANTERIOR CERVICAL DECOMPRESSION/DISCECTOMY FUSION Plating and Bonegraft  2 LEVELS;  Surgeon: Ashok Pall, MD;  Location: [facility]URO ORS;  Service: Neurosurgery;  Laterality: N/A;  Cervical Six-Seven ANTERIOR CERVICAL DECOMPRESSION/DISCECTOMY FUSION Plating and Bonegraft  2 LEVELS  . APPENDECTOMY  1985  . AXILLARY HIDRADENITIS EXCISION  1990-2008   bilateral  . BACK SURGERY    . BREAST BIOPSY Right 2007  . BREAST CYST EXCISION Right 2008  . BREAST REDUCTION SURGERY    . CARDIAC CATHETERIZATION  2004   mild disease  . CATARACT EXTRACTION W/PHACO Left 05/16/2015   Procedure: CATARACT EXTRACTION PHACO AND INTRAOCULAR LENS PLACEMENT (IOC);  Surgeon: Rutherford Guys, MD;  Location: [facility];  Service: Ophthalmology;  Laterality: Left;  CDE: 4.24  . CATARACT EXTRACTION W/PHACO Right 05/30/2015   Procedure: CATARACT EXTRACTION RIGHT EYE PHACO AND INTRAOCULAR LENS PLACEMENT ;  Surgeon: Rutherford Guys, MD;  Location: [facility];  Service: Ophthalmology;  Laterality: Right;  CDE:4.08  . CHOLECYSTECTOMY  1990's  . COLONOSCOPY  08/12/2011   Procedure: COLONOSCOPY;  Surgeon: Ladene Artist,  MD,FACG;  [facility];  Service: Endoscopy;  Laterality: N/A;  . COLONOSCOPY  06/19/2006  . cyst thigh Right   . ESOPHAGOGASTRODUODENOSCOPY  08/12/2011   Procedure: ESOPHAGOGASTRODUODENOSCOPY (EGD);  Surgeon: Ladene Artist, MD,FACG;  Location: [facility] ENDOSCOPY;  Service: Endoscopy;  Laterality: N/A;  . ESOPHAGOGASTRODUODENOSCOPY  06/04/2005  . FEMORAL-POPLITEAL BYPASS GRAFT Left 06/19/2016   Procedure: Left Leg BYPASS GRAFT FEMORAL-POPLITEAL ARTERY;  Surgeon: Serafina Mitchell, MD;  Location: [facility];  Service: Vascular;  Laterality: Left;  . GIVENS CAPSULE STUDY  08/13/2011   Procedure: GIVENS CAPSULE STUDY;  Surgeon: Ladene Artist, MD,FACG;  Location: [facility] ENDOSCOPY;  Service: Endoscopy;  Laterality: N/A;  . HAMMER TOE SURGERY Bilateral ~ 2000  . HIATAL HERNIA REPAIR    . HYDRADENITIS EXCISION  01/2011; 03/2012   'groin and abdomen; 03/2012" (09/14/2012)  . HYDRADENITIS EXCISION  04/01/2012   Procedure: EXCISION HYDRADENITIS GROIN;  Surgeon: Pedro Earls, MD;  Location: [facility] ORS;  Service: General;  Laterality: Bilateral;  Excision of Hydradenitis of Perineum  . HYDRADENITIS EXCISION  N/A 09/17/2013   Procedure: EXCISION PERINEAL HIDRADENITIS ;  Surgeon: Pedro Earls, MD;  Location: [facility] ORS;  Service: General;  Laterality: N/A;  also in the pubis area  . MASS EXCISION Right 09/17/2013   Procedure: EXCISION MASS;  Surgeon: Pedro Earls, MD;  Location: [facility] ORS;  Service: General;  Laterality: Right;  . NISSEN FUNDOPLICATION  9379  . PERIPHERAL VASCULAR CATHETERIZATION N/A 11/03/2014   Procedure: Lower Extremity Angiography;  Surgeon: Lorretta Harp, MD;  Location: [facility] CV LAB;  Service: Cardiovascular;  Laterality: N/A;  . PERIPHERAL VASCULAR CATHETERIZATION N/A 01/29/2016   Procedure: Lower Extremity Angiography;  Surgeon: Lorretta Harp, MD;  Location: [facility];  Service: Cardiovascular;  Laterality: N/A;  . POSTERIOR LUMBAR FUSION  2008 X 2  . REDUCTION MAMMAPLASTY   1996?  . TONSILLECTOMY AND ADENOIDECTOMY  1959 AND 2000  . UVULOPALATOPHARYNGOPLASTY, TONSILLECTOMY AND SEPTOPLASTY  2000's    OB History    No data available       Home Medications    Prior to Admission medications   Medication Sig Start Date End Date Taking? Authorizing Provider  albuterol (PROAIR HFA) 108 (90 Base) MCG/ACT inhaler 1-2 inhalations every 4-6 hours as needed for cough or wheeze. Patient taking differently: Inhale 1-2 puffs into the lungs every 4 (four) hours as needed for shortness of breath.  07/10/15  Yes Bobbitt, Sedalia Muta, MD  ALPRAZolam Duanne Moron) 0.5 MG tablet Take 0.25 mg by mouth daily as needed for anxiety or sleep.    Yes [provider]  aspirin EC 81 MG tablet Take 81 mg by mouth daily.   Yes [provider]  Azelastine HCl 0.15 % SOLN Place 2 sprays into both nostrils 2 (two) times daily. Patient taking differently: Place 2 sprays into both nostrils 2 (two) times daily as needed (allergies).  07/10/15  Yes Bobbitt, Sedalia Muta, MD  Calcium Carb-Cholecalciferol (CALCIUM 600 + D PO) Take 1 tablet by mouth daily.   Yes [provider]  citalopram (CELEXA) 10 MG tablet Take 10 mg by mouth daily.   Yes [provider]  clopidogrel (PLAVIX) 75 MG tablet Take 75 mg by mouth at bedtime.    Yes [provider]  cyclobenzaprine (FLEXERIL) 10 MG tablet Take 10 mg by mouth 3 (three) times daily as needed for muscle spasms.   Yes [provider]  dicyclomine (BENTYL) 20 MG tablet Take 1 tablet (20 mg total) by mouth 3 (three) times daily as needed for spasms. 06/21/16  Yes Virgina Jock A, PA-C  EPINEPHrine 0.3 mg/0.3 mL IJ SOAJ injection Inject 0.3 mLs (0.3 mg total) into the muscle once. 08/02/15  Yes Bobbitt, Sedalia Muta, MD  Fenofibric Acid 105 MG TABS Take 1 tablet by mouth daily.   Yes [provider]  ibuprofen (ADVIL,MOTRIN) 800 MG tablet Take 800 mg by mouth every 8 (eight) hours as needed for mild pain  or moderate pain.    Yes [provider]  insulin aspart (NOVOLOG) 100 UNIT/ML injection Inject 5-12 Units into the skin 2 (two) times daily before a meal. Per sliding scale   Yes [provider]  Insulin Glargine (TOUJEO SOLOSTAR) 300 UNIT/ML SOPN Inject 37 Units into the skin every morning.    Yes [provider]  insulin lispro (HUMALOG) 100 UNIT/ML injection Inject 5-12 Units into the skin 2 (two) times daily before a meal. Per sliding scale   Yes [provider]  KLOR-CON 10 10 MEQ tablet  Take 10 mEq by mouth daily as needed for cramping. 05/03/16  Yes [provider]  loratadine (CLARITIN) 10 MG tablet Take 10 mg by mouth daily as needed for allergies.   Yes [provider]  mupirocin cream (BACTROBAN) 2 % Apply 1 application topically 2 (two) times daily as needed (wound care (boils)).    Yes [provider]  nitroGLYCERIN (NITROSTAT) 0.4 MG SL tablet Place 1 tablet (0.4 mg total) under the tongue every 5 (five) minutes as needed for chest pain. Maximum 3 doses 05/29/15  Yes Lorretta Harp, MD  olmesartan-hydrochlorothiazide (BENICAR HCT) 20-12.5 MG tablet Take 1 tablet by mouth daily. 04/19/16  Yes [provider]  pantoprazole (PROTONIX) 40 MG tablet TAKE 1 TABLET (40 MG TOTAL) BY MOUTH DAILY. 10/30/16  Yes Isaiah Serge, NP  propranolol ER (INDERAL LA) 60 MG 24 hr capsule Take 60 mg by mouth every evening. 11/15/16  Yes [provider]  rosuvastatin (CRESTOR) 40 MG tablet Take 40 mg by mouth daily.   Yes [provider]  SUMAtriptan (IMITREX) 50 MG tablet Take 50 mg by mouth every 2 (two) hours as needed for migraine.   Yes [provider]  temazepam (RESTORIL) 30 MG capsule Take 30 mg by mouth at bedtime. 12/13/16  Yes [provider]  TRULICITY 8.58 IF/0.2DX SOPN Inject 0.75 mg into the skin every Monday. On Fridays 07/29/15  Yes [provider]  nitroGLYCERIN (NITRODUR -  DOSED IN MG/24 HR) 0.4 mg/hr Place 0.4 mg onto the skin as needed (chest pain). Reported on 07/10/2015    [provider]    Family History Family History  Problem Relation Age of Onset  . Breast cancer Mother   . Heart disease Mother   . Hypertension Mother   . Diabetes Father   . Heart disease Father   . Stroke Father   . Heart disease Sister   . Hypertension Sister   . Hypertension Sister   . Hyperlipidemia Sister   . Diabetes Sister   . Allergic rhinitis Sister   . Emphysema Unknown        great uncle  . Aneurysm Sister        brain  . Angioedema Neg Hx   . Asthma Neg Hx   . Eczema Neg Hx   . Immunodeficiency Neg Hx   . Urticaria Neg Hx     Social History Social History  Substance Use Topics  . Smoking status: Former Smoker    Packs/day: 0.33    Years: 41.00    Types: Cigarettes    Quit date: 01/15/2016  . Smokeless tobacco: Never Used     Comment: Getting ready to start nicotine patches RX by Dr. Gwenlyn Found per pt.  . Alcohol use No     Allergies   Sulfa antibiotics; Codeine; Fish allergy; Iodine; Metformin and related; Adhesive [tape]; and Shellfish allergy   Review of Systems Review of Systems  Constitutional: Positive for diaphoresis. Negative for fever.  Respiratory: Positive for shortness of breath.   Cardiovascular: Positive for chest pain. Negative for syncope and near-syncope.  Gastrointestinal: Positive for nausea. Negative for abdominal pain and vomiting.  Neurological: Negative for numbness.  All other systems reviewed and are negative.    Physical Exam Updated Vital Signs BP 112/66 (BP Location: Right Arm)   Pulse 85   Temp 98.7 F (37.1 C) (Oral)   Resp 13   Ht 5\' 4"  (1.626 m)   Wt 65.8 kg (145 lb)  SpO2 97%   BMI 24.89 kg/m   Physical Exam Physical Exam  Nursing note and vitals reviewed. Constitutional: non-toxic, and in no acute distress Head: Normocephalic and atraumatic.  Mouth/Throat: Oropharynx is clear and moist.    Neck: Normal range of motion. Neck supple.  Cardiovascular: Tachycardic rate and regular rhythm.   Pulmonary/Chest: Effort normal and breath sounds normal. no edema Abdominal: Soft. There is no tenderness. There is no rebound and no guarding.  Musculoskeletal: Normal range of motion.  Neurological: Alert, no facial droop, fluent speech, moves all extremities symmetrically Skin: Skin is warm and dry.  Psychiatric: Cooperative   ED Treatments / Results  Labs (all labs ordered are listed, but only abnormal results are displayed) Labs Reviewed  BASIC METABOLIC PANEL - Abnormal; Notable for the following:       Result Value   Potassium 3.4 (*)    Chloride 99 (*)    Glucose, Bld 164 (*)    BUN 24 (*)    Creatinine, Ser 1.89 (*)    Calcium 10.6 (*)    GFR calc non Af Amer 28 (*)    GFR calc Af Amer 32 (*)    All other components within normal limits  CBC  TROPONIN I  I-STAT TROPONIN, ED    EKG  EKG Interpretation None       Radiology Dg Chest 2 View  Result Date: 12/24/2016 CLINICAL DATA:  Bilateral chest pain with shortness of breath which began 3 days ago. Symptoms were intermittent until today. History of coronary artery disease and previous MI, asthma, ovarian malignancy, diabetes EXAM: CHEST  2 VIEW COMPARISON:  PA and lateral chest x-ray of January 18, 2016 FINDINGS: The lungs are well-expanded. There is no focal infiltrate. The interstitial markings are coarse though stable. The heart and pulmonary vascularity are normal. There is calcification in the wall of the aortic arch. There is no pleural effusion. The bony thorax exhibits no acute abnormality. IMPRESSION: Mild interstitial prominence is consistent with known reactive airway disease and the patient's smoking history. There is no acute pneumonia nor CHF. Thoracic aortic atherosclerosis. Electronically Signed   By: David  [name] M.D.   On: 12/24/2016 16:20    Procedures Procedures (including critical care  time)  Medications Ordered in ED Medications  aspirin tablet 325 mg (325 mg Oral Given 12/24/16 1555)  sodium chloride 0.9 % bolus 500 mL (0 mLs Intravenous Stopped 12/24/16 1718)  fentaNYL (SUBLIMAZE) injection 50 mcg (50 mcg Intravenous Given 12/24/16 1555)  sodium chloride 0.9 % bolus 500 mL (500 mLs Intravenous New Bag/Given 12/24/16 1715)  gi cocktail (Maalox,Lidocaine,Donnatal) (30 mLs Oral Given 12/24/16 1718)     Initial Impression / Assessment and Plan / ED Course  I have reviewed the triage vital signs and the nursing notes.  Pertinent labs & imaging results that were available during my care of the patient were reviewed by me and considered in my medical decision making (see chart for details).     With multiple vascular risk factors. Presenting with chest pain. Minimal chest pain on arrival to the ED. There are concerning features of her history for ACS. Her initial EKG shows no acute ischemic changes. Similar to prior EKG. Repeat EKG without dynamic changes. Initial troponin is normal. Chest x-ray visualized and shows no acute cardiopulmonary processes. Given the high risk for ACS, plan to admit for ongoing rule out.   Received full dose ASA. Nitroglycerin withheld initially given low BP and tachycardia. Given IVF fluids, with resolution.  With AKI on blood work as well. No PE risk factors. History and exam at this time not concerning for dissection.   Spoke with Dr. Melvia Heaps who will admit  Final Clinical Impressions(s) / ED Diagnoses   Final diagnoses:  Precordial chest pain    New Prescriptions New Prescriptions   No medications on file     Forde Dandy, MD 12/24/16 [phone]

## 2016-12-24 NOTE — Telephone Encounter (Signed)
Patient states that she has been having chest pain and pressure that started at 11 am today. Patient states she took 1 Nito at this time and has had some relief. Pt. does c/o SOB, headache, sweating and nausea.  Pt was seen by PCP today and told to call [facility] regarding her chest pain. Patient informed to be seen local ED.

## 2016-12-24 NOTE — Telephone Encounter (Signed)
See f/u documentation by Alda Berthold Pinnix

## 2016-12-24 NOTE — ED Triage Notes (Signed)
Patient complains of bilateral chest pain with shortness of breath. Stats history of MI. Pt states that chest pain has been intermittent until today. She states nitro did not help at home today.

## 2016-12-24 NOTE — Telephone Encounter (Signed)
New message    Pt c/o of Chest Pain: STAT if CP now or developed within 24 hours  1. Are you having CP right now? Pt had chest pain this morning  2. Are you experiencing any other symptoms (ex. SOB, nausea, vomiting, sweating)? Lightheaded, dry heaves, sweating, bad taste in her mouth, diaherra    3. How long have you been experiencing CP? Started this morning   4. Is your CP continuous or coming and going? continous but calmed down  5. Have you taken Nitroglycerin? yes ?

## 2016-12-24 NOTE — ED Notes (Signed)
Report to Heather, RN 300 

## 2016-12-24 NOTE — H&P (Signed)
[facility] History and Physical  DHRUVI CRENSHAW BSJ:[id number] DOB: December 29, 1955 DOA: 12/24/2016  Referring physician: Dr Oleta Mouse PCP: [name], MD   Chief Complaint: Chest pain  HPI: [name] is a 61 y.o. female with history of PAD of LE's, HTN, DM2 on insulin who presented with chest pain to ED today.   CP started about 11 am today, came to ED about 3pm. Substernal CP w/o radiation, assoc with nausea and vomiting and diarrhea.  Chest pain came on first.  Tried SL NTG w/o relief.  No syncope/ no palpitations.  +SOB.  Feeling better now, but still mild residual CP.  Asked to see for admission.   Pt says she has had "2 or 3" heart catheterizations and that they showed some disease but not severe.  Her 1st heart doctor was dr Doylene Canard and now she sees Dr Gwenlyn Found in [place].  Had L leg fem-pop bypass this year and has had prior stenting to the legs in the past.  She had a CVA once in 2009 and again in 2010 and again in 2011.  Quit smoking 1 yr ago.    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  Divorced, living alone in [place].     ROS  no joint pain   no HA  no blurry vision  no rash  no dysuria  no difficulty voiding  no change in urine color    Past Medical History  Past Medical History:  Diagnosis Date  . 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 Anemia    hx  . Angioedema 07/10/2015  . Anxiety   . Arthritis    "neck, left hand" (09/14/2012)  . Asthma   . Cancer ([place]) 1985   ovarian, no treatment except surgery  . Complication of anesthesia    OCCASIONAL TROUBLE TURNING NECK TO RIGHT  . Critical lower limb ischemia    10/2014 s/p L SFA stenting  . Diverticulosis of colon with hemorrhage April 2013  . GERD (gastroesophageal reflux disease)   . H/O hiatal hernia   . Heart attack [facility])    2003 mild MI, March 2013 mild MI  . Hidradenitis    groin  . History of blood transfusion 1985 AND 2013   . Hyperlipidemia   . Hypertension   . Irritable bowel syndrome   . Left-sided weakness    since stroke, left eye trouble seeing  . Migraines   . Mild CAD    a. Cath 09/2010: mild luminal irregularities of LAD, 30% prox RCA and 20-30% mRCA, EF 65%.  . Neuropathy   . Obesity   . PAD (peripheral artery disease) ([place])    a. critical limb ischemia s/p PTA/stenting of L SFA 10/2014. c. occ prior SFA stent by angio 01/2016, for possible PV bypass.  . Pneumonia    baby  . Recurrent upper respiratory infection (URI)   . S/P arterial stent-mid Lt SFA 11/03/14 11/04/2014  . Schatzki's ring   . Sinus problem   . Stomach ulcer 1972   non-bleeding  . Stroke ([place]) 07-2007, 07-2008, 07-2009   total 3 strokes; mild left sided weakness and left eye "jumps".  . Type II diabetes mellitus ([place])   . Vitamin B 12 deficiency 07-15-2013   Past Surgical History  Past Surgical History:  Procedure Laterality Date  . ABDOMINAL HYSTERECTOMY  1985  . ADENOIDECTOMY    . ANTERIOR CERVICAL DECOMP/DISCECTOMY FUSION  2002  .  ANTERIOR CERVICAL DECOMP/DISCECTOMY FUSION N/A 04/08/2014   Procedure: Cervical Six-Seven ANTERIOR CERVICAL DECOMPRESSION/DISCECTOMY FUSION Plating and Bonegraft  2 LEVELS;  Surgeon: Ashok Pall, MD;  Location: [facility]O ORS;  Service: Neurosurgery;  Laterality: N/A;  Cervical Six-Seven ANTERIOR CERVICAL DECOMPRESSION/DISCECTOMY FUSION Plating and Bonegraft  2 LEVELS  . APPENDECTOMY  1985  . AXILLARY HIDRADENITIS EXCISION  1990-2008   bilateral  . BACK SURGERY    . BREAST BIOPSY Right 2007  . BREAST CYST EXCISION Right 2008  . BREAST REDUCTION SURGERY    . CARDIAC CATHETERIZATION  2004   mild disease  . CATARACT EXTRACTION W/PHACO Left 05/16/2015   Procedure: CATARACT EXTRACTION PHACO AND INTRAOCULAR LENS PLACEMENT (IOC);  Surgeon: Rutherford Guys, MD;  Location: [facility];  Service: Ophthalmology;  Laterality: Left;  CDE: 4.24  . CATARACT EXTRACTION W/PHACO Right 05/30/2015   Procedure: CATARACT  EXTRACTION RIGHT EYE PHACO AND INTRAOCULAR LENS PLACEMENT ;  Surgeon: Rutherford Guys, MD;  Location: [facility];  Service: Ophthalmology;  Laterality: Right;  CDE:4.08  . CHOLECYSTECTOMY  1990's  . COLONOSCOPY  08/12/2011   Procedure: COLONOSCOPY;  Surgeon: Ladene Artist, MD,FACG;  Location: [facility] ENDOSCOPY;  Service: Endoscopy;  Laterality: N/A;  . COLONOSCOPY  06/19/2006  . cyst thigh Right   . ESOPHAGOGASTRODUODENOSCOPY  08/12/2011   Procedure: ESOPHAGOGASTRODUODENOSCOPY (EGD);  Surgeon: Ladene Artist, MD,FACG;  Location[facility] ENDOSCOPY;  Service: Endoscopy;  Laterality: N/A;  . ESOPHAGOGASTRODUODENOSCOPY  06/04/2005  . FEMORAL-POPLITEAL BYPASS GRAFT Left 06/19/2016   Procedure: Left Leg BYPASS GRAFT FEMORAL-POPLITEAL ARTERY;  Surgeon: Serafina Mitchell, MD;  Location: [facility];  Service: Vascular;  Laterality: Left;  . GIVENS CAPSULE STUDY  08/13/2011   Procedure: GIVENS CAPSULE STUDY;  Surgeon: Ladene Artist, MD,FACG;  Location: [facility] ENDOSCOPY;  Service: Endoscopy;  Laterality: N/A;  . HAMMER TOE SURGERY Bilateral ~ 2000  . HIATAL HERNIA REPAIR    . HYDRADENITIS EXCISION  01/2011; 03/2012   'groin and abdomen; 03/2012" (09/14/2012)  . HYDRADENITIS EXCISION  04/01/2012   Procedure: EXCISION HYDRADENITIS GROIN;  Surgeon: Pedro Earls, MD;  Location: [facility];  Service: General;  Laterality: Bilateral;  Excision of Hydradenitis of Perineum  . HYDRADENITIS EXCISION N/A 09/17/2013   Procedure: EXCISION PERINEAL HIDRADENITIS ;  Surgeon: Pedro Earls, MD;  Location: [facility];  Service: General;  Laterality: N/A;  also in the pubis area  . MASS EXCISION Right 09/17/2013   Procedure: EXCISION MASS;  Surgeon: Pedro Earls, MD;  Location: [facility];  Service: General;  Laterality: Right;  . NISSEN FUNDOPLICATION  6789  . PERIPHERAL VASCULAR CATHETERIZATION N/A 11/03/2014   Procedure: Lower Extremity Angiography;  Surgeon: Lorretta Harp, MD;  Location: [facility] CV LAB;  Service: Cardiovascular;  Laterality: N/A;   . PERIPHERAL VASCULAR CATHETERIZATION N/A 01/29/2016   Procedure: Lower Extremity Angiography;  Surgeon: Lorretta Harp, MD;  Location: [facility] CV LAB;  Service: Cardiovascular;  Laterality: N/A;  . POSTERIOR LUMBAR FUSION  2008 X 2  . REDUCTION MAMMAPLASTY  1996?  . TONSILLECTOMY AND ADENOIDECTOMY  1959 AND 2000  . UVULOPALATOPHARYNGOPLASTY, TONSILLECTOMY AND SEPTOPLASTY  2000's   Family History  Family History  Problem Relation Age of Onset  . Breast cancer Mother   . Heart disease Mother   . Hypertension Mother   . Diabetes Father   . Heart disease Father   . Stroke Father   . Heart disease Sister   . Hypertension Sister   . Hypertension Sister   . Hyperlipidemia Sister   .  Diabetes Sister   . Allergic rhinitis Sister   . Emphysema Unknown        great uncle  . Aneurysm Sister        brain  . Angioedema Neg Hx   . Asthma Neg Hx   . Eczema Neg Hx   . Immunodeficiency Neg Hx   . Urticaria Neg Hx    Social History  reports that she quit smoking about 11 months ago. Her smoking use included Cigarettes. She has a 13.53 pack-year smoking history. She has never used smokeless tobacco. She reports that she does not drink alcohol or use drugs. Allergies  Allergies  Allergen Reactions  . Sulfa Antibiotics Shortness Of Breath and Palpitations  . 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 Fish Allergy Hives and Swelling    Tongue swelling  . Iodine Swelling  . Metformin And Related Diarrhea  . Adhesive [Tape] Rash    Paper tape is ok  . Shellfish Allergy Swelling and Rash    Tongue swelling   Home medications Prior to Admission medications   Medication Sig Start Date End Date Taking? Authorizing Provider  albuterol (PROAIR HFA) 108 (90 Base) MCG/ACT inhaler 1-2 inhalations every 4-6 hours as needed for cough or wheeze. Patient taking differently: Inhale 1-2 puffs into the lungs every 4 (four) hours as needed for shortness of  breath.  07/10/15  Yes Bobbitt, Sedalia Muta, MD  ALPRAZolam Duanne Moron) 0.5 MG tablet Take 0.25 mg by mouth daily as needed for anxiety or sleep.    Yes [provider]  aspirin EC 81 MG tablet Take 81 mg by mouth daily.   Yes [provider]  Azelastine HCl 0.15 % SOLN Place 2 sprays into both nostrils 2 (two) times daily. Patient taking differently: Place 2 sprays into both nostrils 2 (two) times daily as needed (allergies).  07/10/15  Yes Bobbitt, Sedalia Muta, MD  Calcium Carb-Cholecalciferol (CALCIUM 600 + D PO) Take 1 tablet by mouth daily.   Yes [provider]  citalopram (CELEXA) 10 MG tablet Take 10 mg by mouth daily.   Yes [provider]  clopidogrel (PLAVIX) 75 MG tablet Take 75 mg by mouth at bedtime.    Yes [provider]  cyclobenzaprine (FLEXERIL) 10 MG tablet Take 10 mg by mouth 3 (three) times daily as needed for muscle spasms.   Yes [provider]  dicyclomine (BENTYL) 20 MG tablet Take 1 tablet (20 mg total) by mouth 3 (three) times daily as needed for spasms. 06/21/16  Yes Virgina Jock A, PA-C  EPINEPHrine 0.3 mg/0.3 mL IJ SOAJ injection Inject 0.3 mLs (0.3 mg total) into the muscle once. 08/02/15  Yes Bobbitt, Sedalia Muta, MD  Fenofibric Acid 105 MG TABS Take 1 tablet by mouth daily.   Yes [provider]  ibuprofen (ADVIL,MOTRIN) 800 MG tablet Take 800 mg by mouth every 8 (eight) hours as needed for mild pain or moderate pain.    Yes [provider]  insulin aspart (NOVOLOG) 100 UNIT/ML injection Inject 5-12 Units into the skin 2 (two) times daily before a meal. Per sliding scale   Yes [provider]  Insulin Glargine (TOUJEO SOLOSTAR) 300 UNIT/ML SOPN Inject 37 Units into the skin every morning.    Yes [provider]  insulin lispro (HUMALOG) 100 UNIT/ML injection Inject 5-12 Units into the skin 2 (two) times daily before a meal. Per sliding scale   Yes [provider]  KLOR-CON  10 10 MEQ tablet Take 10 mEq by mouth daily as needed for cramping. 05/03/16  Yes [provider]  loratadine (CLARITIN) 10 MG tablet Take 10 mg by mouth daily as needed for allergies.   Yes [provider]  mupirocin cream (BACTROBAN) 2 % Apply 1 application topically 2 (two) times daily as needed (wound care (boils)).    Yes [provider]  nitroGLYCERIN (NITROSTAT) 0.4 MG SL tablet Place 1 tablet (0.4 mg total) under the tongue every 5 (five) minutes as needed for chest pain. Maximum 3 doses 05/29/15  Yes Lorretta Harp, MD  olmesartan-hydrochlorothiazide (BENICAR HCT) 20-12.5 MG tablet Take 1 tablet by mouth daily. 04/19/16  Yes [provider]  pantoprazole (PROTONIX) 40 MG tablet TAKE 1 TABLET (40 MG TOTAL) BY MOUTH DAILY. 10/30/16  Yes Isaiah Serge, NP  propranolol ER (INDERAL LA) 60 MG 24 hr capsule Take 60 mg by mouth every evening. 11/15/16  Yes [provider]  rosuvastatin (CRESTOR) 40 MG tablet Take 40 mg by mouth daily.   Yes [provider]  SUMAtriptan (IMITREX) 50 MG tablet Take 50 mg by mouth every 2 (two) hours as needed for migraine.   Yes [provider]  temazepam (RESTORIL) 30 MG capsule Take 30 mg by mouth at bedtime. 12/13/16  Yes [provider]  TRULICITY 5.64 PP/2.9JJ SOPN Inject 0.75 mg into the skin every Monday. On Fridays 07/29/15  Yes [provider]  nitroGLYCERIN (NITRODUR - DOSED IN MG/24 HR) 0.4 mg/hr Place 0.4 mg onto the skin as needed (chest pain). Reported on 07/10/2015    [provider]   Liver Function Tests No results for input(s): AST, ALT, ALKPHOS, BILITOT, PROT, ALBUMIN in the last 168 hours. No results for input(s): LIPASE, AMYLASE in the last 168 hours. CBC  Recent Labs Lab 12/24/16 1521  WBC 5.5  HGB 13.2  HCT 40.6  MCV 82.0  PLT 884   Basic Metabolic Panel  Recent Labs Lab 12/24/16 1521  NA 139  K 3.4*  CL 99*  CO2 29  GLUCOSE 164*  BUN 24*   CREATININE 1.89*  CALCIUM 10.6*     Vitals:   12/24/16 1830 12/24/16 1832 12/24/16 1900 12/24/16 1930  BP: 121/85 112/66 111/81 123/75  Pulse:  85 90 81  Resp: 16 13    Temp:      TempSrc:      SpO2:  97% 100% 96%  Weight:      Height:       Exam: Gen alert, no distress, calm No rash, cyanosis or gangrene Sclera anicteric, throat clear  No jvd or bruits Chest clear bilat, good air movement RRR no MRG Abd soft ntnd no mass or ascites +bs GU defer MS no joint effusions or deformity Ext no LE or UE edema / no wounds or ulcers Neuro is alert, Ox 3 , nf    Home meds: -albuterol/ protonix -xanax prn/ celexa 10 qd/ flexeril prn/ bentyl prn/ ibuprofen/  Claritin/ imitrex prn/ restoril prn -asa/ plavix/ fenofibric acid/ KCl/ prn sl NTG/ crestor/ NTG patch -novolog/ insulin glargine 37 u qam/ humalog/ dulaglutide SQ weekly -olmesartan/ HCTZ, propranolol ER    Na 139 K 3.4  CO2 29  BUN 24  Cr 1.89  (Creat 0.8 in Feb 2018)  EKG (independ reviewed) > NSR , prolonged QTc 502 msec, no acute changes CXR (independ reviewed) > no acute disease    Assessment: 1.  Chest pain -  assoc with N/V, acute onset.  Hx PAD but has had min CAD by L heart cath (09/2010) and negative nuc scan for atypical CP (07/2015) in the past.  Trop's and EKG's negative here. Had some associated GI symptoms, hx of "reflux surgery" years ago.  Poss GI issue.  Plan admit, serial trop, IV PPI bid.   2.  PAD of LE's - sp stenting and then L fem-pop this year 3.  IDDM -cont insulin and trulicity as at home 4.  HTN - cont BB, hold diuretic/ ARB 5.  AKI - creat up 1.8, not sure cause. Is on ARBi, nsaids, may be dry. Will hold ARB and nsaids and give IVF's.    Plan - as above      [place] [facility] Pager [phone]   If 7PM-7AM, please contact night-coverage [email or web address] Password [email or web address] 12/24/2016, 7:56 PM

## 2016-12-25 DIAGNOSIS — I10 Essential (primary) hypertension: Secondary | ICD-10-CM

## 2016-12-25 DIAGNOSIS — R112 Nausea with vomiting, unspecified: Secondary | ICD-10-CM

## 2016-12-25 DIAGNOSIS — R079 Chest pain, unspecified: Secondary | ICD-10-CM | POA: Diagnosis not present

## 2016-12-25 DIAGNOSIS — R072 Precordial pain: Secondary | ICD-10-CM

## 2016-12-25 DIAGNOSIS — Z794 Long term (current) use of insulin: Secondary | ICD-10-CM | POA: Diagnosis not present

## 2016-12-25 DIAGNOSIS — I739 Peripheral vascular disease, unspecified: Secondary | ICD-10-CM

## 2016-12-25 DIAGNOSIS — I208 Other forms of angina pectoris: Secondary | ICD-10-CM | POA: Diagnosis not present

## 2016-12-25 DIAGNOSIS — N179 Acute kidney failure, unspecified: Secondary | ICD-10-CM

## 2016-12-25 DIAGNOSIS — Z95828 Presence of other vascular implants and grafts: Secondary | ICD-10-CM | POA: Diagnosis not present

## 2016-12-25 DIAGNOSIS — E118 Type 2 diabetes mellitus with unspecified complications: Secondary | ICD-10-CM | POA: Diagnosis not present

## 2016-12-25 LAB — GLUCOSE, CAPILLARY
Glucose-Capillary: 166 mg/dL — ABNORMAL HIGH (ref 65–99)
Glucose-Capillary: 99 mg/dL (ref 65–99)
Glucose-Capillary: 109 mg/dL — ABNORMAL HIGH (ref 65–99)
Glucose-Capillary: 141 mg/dL — ABNORMAL HIGH (ref 65–99)

## 2016-12-25 LAB — TROPONIN I
Troponin I: <0.03 ng/mL (ref <0.03)
Troponin I: <0.03 ng/mL (ref <0.03)

## 2016-12-25 MED ORDER — ENOXAPARIN SODIUM 30 MG/0.3ML ~~LOC~~ SOLN
30.0000 mg | SUBCUTANEOUS | Status: DC
  Filled 2016-12-25: qty 0.3

## 2016-12-25 MED ORDER — REGADENOSON 0.4 MG/5ML IV SOLN
0.4000 mg | Freq: Once | INTRAVENOUS | Status: AC
  Administered 2016-12-26: 0.4 mg via INTRAVENOUS
  Filled 2016-12-25: qty 5

## 2016-12-25 NOTE — Care Management Note (Signed)
Case Management Note  Patient Details  Name: [name] MRN: [id number] Date of Birth: 1955-11-25  Subjective/Objective:                  Admitted with CP. Pt seen to give MOON form. Chart reviewed for DC needs. Pt is from home, ind with ADL's. Has PCP, transportation and insurance with drug coverage.   Action/Plan: No CM needs noted. Anticiapte DC home with self care. Stress Test pending.   Expected Discharge Date:    12/26/2016              Expected Discharge Plan:  Home/Self Care  In-House Referral:  NA  Discharge planning Services  CM Consult  Post Acute Care Choice:  NA Choice offered to:  NA  Status of Service:  Completed, signed off  Sherald Barge, RN 12/25/2016, 3:08 PM

## 2016-12-25 NOTE — Consult Note (Addendum)
Cardiology Consultation:   Patient ID: [name]; [id number]; 08-29-1955   Admit date: 12/24/2016 Date of Consult: 12/25/2016  Primary Care Provider: [name], MD Primary Cardiologist: Dr. Gwenlyn Found    Patient Profile:   [name] is a 61 y.o. female with a hx of PVD who is being seen today for the evaluation of chest pain at the request of Dr. Jerilee Hoh.  History of Present Illness:   [name] a 61 year old female patient last seen by Dr. Gwenlyn Found for PVD 02/2016. She has a history of stent to the left SFA in 2016 and was found to have occlusion of the stent on follow-up angiogram 01/2016. She underwent fem pop bypass with PTFE 06/09/16. She also has history of CAD with cath in 2004 mild irregularities in the LAD, 30% proximal RCA, 20-30% mid RCA LVEF 65%. Previous CVA x3, hypertension, HLD, and diabetes mellitus type 2, tobacco abuse-one year ago. Last stress Myoview 07/2015 low risk without ischemia LVEF 55-65%.  Patient came to the ER yesterday with chest pain associated with nausea vomiting and diarrhea. Unrelieved with nitroglycerin. Troponins negative 3, EKG without acute change. Patient states that she's had chest pain off and on for a week but it worsened yesterday. She also had some dizziness. Says nitroglycerin doesn't help. She describes it as an elephant on her chest without radiation. Has reflux and had a funny taste in her mouth. She says it's been off-and-on. She is very active and has no exertional symptoms. Also complaining of headaches.  Past Medical History:  Diagnosis Date  . 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 Anemia    hx  . Angioedema 07/10/2015  . Anxiety   . Arthritis    "neck, left hand" (09/14/2012)  . Asthma   . Cancer ([place]) 1985   ovarian, no treatment except surgery  . Complication of anesthesia    OCCASIONAL TROUBLE TURNING NECK TO RIGHT  . Critical lower limb ischemia    10/2014 s/p L SFA stenting  . Diverticulosis of colon with hemorrhage  April 2013  . GERD (gastroesophageal reflux disease)   . H/O hiatal hernia   . Heart attack [facility])    2003 mild MI, March 2013 mild MI  . Hidradenitis    groin  . History of blood transfusion 1985 AND 2013  . Hyperlipidemia   . Hypertension   . Irritable bowel syndrome   . Left-sided weakness    since stroke, left eye trouble seeing  . Migraines   . Mild CAD    a. Cath 09/2010: mild luminal irregularities of LAD, 30% prox RCA and 20-30% mRCA, EF 65%.  . Neuropathy   . Obesity   . PAD (peripheral artery disease) ([place])    a. critical limb ischemia s/p PTA/stenting of L SFA 10/2014. c. occ prior SFA stent by angio 01/2016, for possible PV bypass.  . Pneumonia    baby  . Recurrent upper respiratory infection (URI)   . S/P arterial stent-mid Lt SFA 11/03/14 11/04/2014  . Schatzki's ring   . Sinus problem   . Stomach ulcer 1972   non-bleeding  . Stroke ([place]) 07-2007, 07-2008, 07-2009   total 3 strokes; mild left sided weakness and left eye "jumps".  . Type II diabetes mellitus ([place])   . Vitamin B 12 deficiency 07-15-2013    Past Surgical History:  Procedure Laterality Date  . ABDOMINAL HYSTERECTOMY  1985  . ADENOIDECTOMY    . ANTERIOR CERVICAL DECOMP/DISCECTOMY FUSION  2002  . ANTERIOR  CERVICAL DECOMP/DISCECTOMY FUSION N/A 04/08/2014   Procedure: Cervical Six-Seven ANTERIOR CERVICAL DECOMPRESSION/DISCECTOMY FUSION Plating and Bonegraft  2 LEVELS;  Surgeon: Ashok Pall, MD;  Location: [facility]URO ORS;  Service: Neurosurgery;  Laterality: N/A;  Cervical Six-Seven ANTERIOR CERVICAL DECOMPRESSION/DISCECTOMY FUSION Plating and Bonegraft  2 LEVELS  . APPENDECTOMY  1985  . AXILLARY HIDRADENITIS EXCISION  1990-2008   bilateral  . BACK SURGERY    . BREAST BIOPSY Right 2007  . BREAST CYST EXCISION Right 2008  . BREAST REDUCTION SURGERY    . CARDIAC CATHETERIZATION  2004   mild disease  . CATARACT EXTRACTION W/PHACO Left 05/16/2015   Procedure: CATARACT EXTRACTION PHACO AND INTRAOCULAR LENS  PLACEMENT (IOC);  Surgeon: Rutherford Guys, MD;  Location: [facility];  Service: Ophthalmology;  Laterality: Left;  CDE: 4.24  . CATARACT EXTRACTION W/PHACO Right 05/30/2015   Procedure: CATARACT EXTRACTION RIGHT EYE PHACO AND INTRAOCULAR LENS PLACEMENT ;  Surgeon: Rutherford Guys, MD;  Location: [facility];  Service: Ophthalmology;  Laterality: Right;  CDE:4.08  . CHOLECYSTECTOMY  1990's  . COLONOSCOPY  08/12/2011   Procedure: COLONOSCOPY;  Surgeon: Ladene Artist, MD,FACG;  Location: [facility] ENDOSCOPY;  Service: Endoscopy;  Laterality: N/A;  . COLONOSCOPY  06/19/2006  . cyst thigh Right   . ESOPHAGOGASTRODUODENOSCOPY  08/12/2011   Procedure: ESOPHAGOGASTRODUODENOSCOPY (EGD);  Surgeon: Ladene Artist, MD,FACG;  Location: [facility] ENDOSCOPY;  Service: Endoscopy;  Laterality: N/A;  . ESOPHAGOGASTRODUODENOSCOPY  06/04/2005  . FEMORAL-POPLITEAL BYPASS GRAFT Left 06/19/2016   Procedure: Left Leg BYPASS GRAFT FEMORAL-POPLITEAL ARTERY;  Surgeon: Serafina Mitchell, MD;  Location: [facility];  Service: Vascular;  Laterality: Left;  . GIVENS CAPSULE STUDY  08/13/2011   Procedure: GIVENS CAPSULE STUDY;  Surgeon: Ladene Artist, MD,FACG;  Location: [facility] ENDOSCOPY;  Service: Endoscopy;  Laterality: N/A;  . HAMMER TOE SURGERY Bilateral ~ 2000  . HIATAL HERNIA REPAIR    . HYDRADENITIS EXCISION  01/2011; 03/2012   'groin and abdomen; 03/2012" (09/14/2012)  . HYDRADENITIS EXCISION  04/01/2012   Procedure: EXCISION HYDRADENITIS GROIN;  Surgeon: Pedro Earls, MD;  Location: [facility];  Service: General;  Laterality: Bilateral;  Excision of Hydradenitis of Perineum  . HYDRADENITIS EXCISION N/A 09/17/2013   Procedure: EXCISION PERINEAL HIDRADENITIS ;  Surgeon: Pedro Earls, MD;  Location: [facility];  Service: General;  Laterality: N/A;  also in the pubis area  . MASS EXCISION Right 09/17/2013   Procedure: EXCISION MASS;  Surgeon: Pedro Earls, MD;  Location: [facility];  Service: General;  Laterality: Right;  . NISSEN FUNDOPLICATION  1660  . PERIPHERAL  VASCULAR CATHETERIZATION N/A 11/03/2014   Procedure: Lower Extremity Angiography;  Surgeon: Lorretta Harp, MD;  Location: [facility] CV LAB;  Service: Cardiovascular;  Laterality: N/A;  . PERIPHERAL VASCULAR CATHETERIZATION N/A 01/29/2016   Procedure: Lower Extremity Angiography;  Surgeon: Lorretta Harp, MD;  Location: [facility] CV LAB;  Service: Cardiovascular;  Laterality: N/A;  . POSTERIOR LUMBAR FUSION  2008 X 2  . REDUCTION MAMMAPLASTY  1996?  . TONSILLECTOMY AND ADENOIDECTOMY  1959 AND 2000  . UVULOPALATOPHARYNGOPLASTY, TONSILLECTOMY AND SEPTOPLASTY  2000's     Home Medications:  Prior to Admission medications   Medication Sig Start Date End Date Taking? Authorizing Provider  albuterol (PROAIR HFA) 108 (90 Base) MCG/ACT inhaler 1-2 inhalations every 4-6 hours as needed for cough or wheeze. Patient taking differently: Inhale 1-2 puffs into the lungs every 4 (four) hours as needed for shortness of breath.  07/10/15  Yes Bobbitt, Deidre Ala  Eulas Post, MD  ALPRAZolam Duanne Moron) 0.5 MG tablet Take 0.25 mg by mouth daily as needed for anxiety or sleep.    Yes [provider]  aspirin EC 81 MG tablet Take 81 mg by mouth daily.   Yes [provider]  Azelastine HCl 0.15 % SOLN Place 2 sprays into both nostrils 2 (two) times daily. Patient taking differently: Place 2 sprays into both nostrils 2 (two) times daily as needed (allergies).  07/10/15  Yes Bobbitt, Sedalia Muta, MD  Calcium Carb-Cholecalciferol (CALCIUM 600 + D PO) Take 1 tablet by mouth daily.   Yes [provider]  citalopram (CELEXA) 10 MG tablet Take 10 mg by mouth daily.   Yes [provider]  clopidogrel (PLAVIX) 75 MG tablet Take 75 mg by mouth at bedtime.    Yes [provider]  cyclobenzaprine (FLEXERIL) 10 MG tablet Take 10 mg by mouth 3 (three) times daily as needed for muscle spasms.   Yes [provider]  dicyclomine (BENTYL) 20 MG tablet Take 1 tablet (20 mg total) by mouth 3  (three) times daily as needed for spasms. 06/21/16  Yes Virgina Jock A, PA-C  EPINEPHrine 0.3 mg/0.3 mL IJ SOAJ injection Inject 0.3 mLs (0.3 mg total) into the muscle once. 08/02/15  Yes Bobbitt, Sedalia Muta, MD  Fenofibric Acid 105 MG TABS Take 1 tablet by mouth daily.   Yes [provider]  ibuprofen (ADVIL,MOTRIN) 800 MG tablet Take 800 mg by mouth every 8 (eight) hours as needed for mild pain or moderate pain.    Yes [provider]  insulin aspart (NOVOLOG) 100 UNIT/ML injection Inject 5-12 Units into the skin 2 (two) times daily before a meal. Per sliding scale   Yes [provider]  Insulin Glargine (TOUJEO SOLOSTAR) 300 UNIT/ML SOPN Inject 37 Units into the skin every morning.    Yes [provider]  insulin lispro (HUMALOG) 100 UNIT/ML injection Inject 5-12 Units into the skin 2 (two) times daily before a meal. Per sliding scale   Yes [provider]  KLOR-CON 10 10 MEQ tablet Take 10 mEq by mouth daily as needed for cramping. 05/03/16  Yes [provider]  loratadine (CLARITIN) 10 MG tablet Take 10 mg by mouth daily as needed for allergies.   Yes [provider]  mupirocin cream (BACTROBAN) 2 % Apply 1 application topically 2 (two) times daily as needed (wound care (boils)).    Yes [provider]  nitroGLYCERIN (NITROSTAT) 0.4 MG SL tablet Place 1 tablet (0.4 mg total) under the tongue every 5 (five) minutes as needed for chest pain. Maximum 3 doses 05/29/15  Yes Lorretta Harp, MD  olmesartan-hydrochlorothiazide (BENICAR HCT) 20-12.5 MG tablet Take 1 tablet by mouth daily. 04/19/16  Yes [provider]  pantoprazole (PROTONIX) 40 MG tablet TAKE 1 TABLET (40 MG TOTAL) BY MOUTH DAILY. 10/30/16  Yes Isaiah Serge, NP  propranolol ER (INDERAL LA) 60 MG 24 hr capsule Take 60 mg by mouth every evening. 11/15/16  Yes [provider]  rosuvastatin (CRESTOR) 40 MG tablet Take 40 mg by mouth daily.   Yes  [provider]  SUMAtriptan (IMITREX) 50 MG tablet Take 50 mg by mouth every 2 (two) hours as needed for migraine.   Yes [provider]  temazepam (RESTORIL) 30 MG capsule Take 30 mg by mouth at bedtime. 12/13/16  Yes [provider]  TRULICITY 6.26 RS/8.5IO SOPN Inject 0.75 mg into the skin every Monday. On Fridays  07/29/15  Yes [provider]  nitroGLYCERIN (NITRODUR - DOSED IN MG/24 HR) 0.4 mg/hr Place 0.4 mg onto the skin as needed (chest pain). Reported on 07/10/2015    [provider]    Inpatient Medications: Scheduled Meds: . aspirin EC  81 mg Oral Daily  . citalopram  10 mg Oral Daily  . clopidogrel  75 mg Oral QHS  . [START ON 12/31/2016] Dulaglutide  0.75 mg Subcutaneous Q Tue  . [START ON 12/26/2016] enoxaparin (LOVENOX) injection  30 mg Subcutaneous Q24H  . fenofibrate  108 mg Oral Daily  . insulin aspart  5-15 Units Subcutaneous BID AC  . insulin glargine  37 Units Subcutaneous q morning - 10a  . pantoprazole  40 mg Oral Daily  . pantoprazole (PROTONIX) IV  40 mg Intravenous Q12H  . potassium chloride  10 mEq Oral Daily  . propranolol ER  60 mg Oral QPM  . rosuvastatin  40 mg Oral Daily   Continuous Infusions: . sodium chloride 75 mL/hr at 12/25/16 0100   PRN Meds: acetaminophen, ALPRAZolam, cyclobenzaprine, gi cocktail, morphine injection, ondansetron (ZOFRAN) IV  Allergies:    Allergies  Allergen Reactions  . Sulfa Antibiotics Shortness Of Breath and Palpitations  . 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 Fish Allergy Hives and Swelling    Tongue swelling  . Iodine Swelling  . Metformin And Related Diarrhea  . Adhesive [Tape] Rash    Paper tape is ok  . Shellfish Allergy Swelling and Rash    Tongue swelling    Social History:   Social History   Social History  . Marital status: Divorced    Spouse name: N/A  . Number of children: 1  . Years of education: N/A    Occupational History  .  Unemployed   Social History Main Topics  . Smoking status: Former Smoker    Packs/day: 0.33    Years: 41.00    Types: Cigarettes    Quit date: 01/15/2016  . Smokeless tobacco: Never Used     Comment: Getting ready to start nicotine patches RX by Dr. Gwenlyn Found per pt.  . Alcohol use No  . Drug use: No     Comment: 05/09/2015.  "quit 07/27/1994"  . Sexual activity: Not Currently    Birth control/ protection: Abstinence   Other Topics Concern  . 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  Divorced, living alone in [place].      Family History:    Family History  Problem Relation Age of Onset  . Breast cancer Mother   . Heart disease Mother   . Hypertension Mother   . Diabetes Father   . Heart disease Father   . Stroke Father   . Heart disease Sister   . Hypertension Sister   . Hypertension Sister   . Hyperlipidemia Sister   . Diabetes Sister   . Allergic rhinitis Sister   . Emphysema Unknown        great uncle  . Aneurysm Sister        brain  . Angioedema Neg Hx   . Asthma Neg Hx   . Eczema Neg Hx   . Immunodeficiency Neg Hx   . Urticaria Neg Hx      ROS:  Please see the history of present illness.  Review of Systems  Constitution: Positive for weakness.  HENT: Negative.   Eyes: Negative.   Cardiovascular: Positive for chest pain.  Respiratory: Negative.   Hematologic/Lymphatic: Negative.   Musculoskeletal: Negative.  Negative for joint pain.  Gastrointestinal: Positive for heartburn and nausea.  Genitourinary: Negative.     All other ROS reviewed and negative.     Physical Exam/Data:   Vitals:   12/24/16 2200 12/24/16 2230 12/24/16 2300 12/25/16 0500  BP: 112/68 114/65 108/66 (!) 171/88  Pulse: 76 66 61 (!) 102  Resp: 18 15 16 18   Temp:   98.1 F (36.7 C) 98.4 F (36.9 C)  TempSrc:   Oral Oral   SpO2: 94% 96% 99% 95%  Weight:   144 lb 6.4 oz (65.5 kg)   Height:   5\' 3"  (1.6 m)     Intake/Output Summary (Last 24 hours) at 12/25/16 1134 Last data filed at 12/25/16 0900  Gross per 24 hour  Intake              890 ml  Output                0 ml  Net              890 ml   Filed Weights   12/24/16 1456 12/24/16 2300  Weight: 145 lb (65.8 kg) 144 lb 6.4 oz (65.5 kg)   Body mass index is 25.58 kg/m.  General:  Well nourished, well developed, in no acute distress HEENT: normal Lymph: no adenopathy Neck: no JVD Endocrine:  No thryomegaly Vascular: No carotid bruits; FA pulses 2+ bilaterally without bruits  Cardiac:  normal S1, S2; RRR; Positive S4 and 1/6 systolic murmur at the apex Lungs:  clear to auscultation bilaterally, no wheezing, rhonchi or rales  Abd: soft, nontender, no hepatomegaly  Ext: no edema Musculoskeletal:  No deformities, BUE and BLE strength normal and equal Skin: warm and dry  Neuro:  CNs 2-12 intact, no focal abnormalities noted Psych:  Normal affect   EKG:  The EKG was personally reviewed and demonstrates:  Sinus tachycardia at 110 bpm with poor R wave progression unchanged from prior EKGs other than sinus tachycardia.. Telemetry:  Telemetry was personally reviewed and demonstrates:  Sinus bradycardia to normal sinus rhythm with PACs and PVCs  Relevant CV Studies: Stress Myoview 2017 Study Highlights   The left ventricular ejection fraction is normal (55-65%).  Nuclear stress EF: 59%.  There was no ST segment deviation noted during stress.  No T wave inversion was noted during stress.  The study is normal.  This is a low risk study.     2-D echo 2014 Study Conclusions  - Left ventricle: The cavity size was normal. There was mild   concentric hypertrophy. Systolic function was normal. The   estimated ejection fraction was in the range of 60% to   65%. Wall motion was normal; there were no regional wall   motion abnormalities. - Mitral  valve: Calcified annulus. Mild regurgitation. - Pulmonary arteries: Systolic pressure was mildly   increased. PA peak pressure: 36mm Hg (S). Transthoracic echocardiography.  M-mode, complete 2D, Laboratory Data:  Chemistry Recent Labs Lab 12/24/16 1521  NA 139  K 3.4*  CL 99*  CO2 29  GLUCOSE 164*  BUN 24*  CREATININE 1.89*  CALCIUM 10.6*  GFRNONAA 28*  GFRAA 32*  ANIONGAP 11    No results for input(s): PROT, ALBUMIN, AST, ALT, ALKPHOS, BILITOT in the last 168 hours. Hematology Recent  Labs Lab 12/24/16 1521  WBC 5.5  RBC 4.95  HGB 13.2  HCT 40.6  MCV 82.0  MCH 26.7  MCHC 32.5  RDW 13.7  PLT 218   Cardiac Enzymes Recent Labs Lab 12/24/16 1521 12/24/16 2331 12/25/16 0207  TROPONINI <0.03 <0.03 <0.03    Recent Labs Lab 12/24/16 1522  TROPIPOC 0.00    BNPNo results for input(s): BNP, PROBNP in the last 168 hours.  DDimer No results for input(s): DDIMER in the last 168 hours.  Radiology/Studies:  Dg Chest 2 View  Result Date: 12/24/2016 CLINICAL DATA:  Bilateral chest pain with shortness of breath which began 3 days ago. Symptoms were intermittent until today. History of coronary artery disease and previous MI, asthma, ovarian malignancy, diabetes EXAM: CHEST  2 VIEW COMPARISON:  PA and lateral chest x-ray of January 18, 2016 FINDINGS: The lungs are well-expanded. There is no focal infiltrate. The interstitial markings are coarse though stable. The heart and pulmonary vascularity are normal. There is calcification in the wall of the aortic arch. There is no pleural effusion. The bony thorax exhibits no acute abnormality. IMPRESSION: Mild interstitial prominence is consistent with known reactive airway disease and the patient's smoking history. There is no acute pneumonia nor CHF. Thoracic aortic atherosclerosis. Electronically Signed   By: David  [name] M.D.   On: 12/24/2016 16:20    Assessment and Plan:   1. Chest pain some typical and atypical features but  not responsive to nitroglycerin, troponins normal and EKG unchanged. History of nonobstructive CAD on cath 2004 negative Lexi scan in 2017. Recommend Lexi scan Myoview in the morning.(says she can't walk on treadmill) 2. PVD status post are stenting and most recently femoropopliteal bypass graft 06/2016 3. Hypertension well controlled on Inderal 4. HLD on Crestor 5. Diabetes mellitus 6. Tobacco abuse quit smoking 1 year ago 7. History of CVA 3 on Plavix and aspirin.   Signed, Ermalinda Barrios, PA-C  12/25/2016 11:34 AM   The patient was seen and examined, and I agree with the history, physical exam, assessment and plan as documented by Gerrianne Scale PA-C, with modifications as noted below. I have also personally reviewed all relevant documentation, old records, labs, and both radiographic and cardiovascular studies. I have also independently interpreted old and new ECG's.  61 yr old woman with nonobstructive CAD, PVD with fem pop bypass in 06/2016, hypertension, and IDDM presented yesterday with chest pain. She initially felt fatigued over the weekend and woke up late yesterday morning which is unusual for her. She began getting dressed and then developed chest pressure. She took one SL nitro without relief. She then began to sweat and had an episode of diarrhea. Denies dysphagia for solids and liquids. Denies GERD symptoms. Yesterday pain rated 6/10, now 4-5/10.  Cath in 10/04/2005 by Dr. Doylene Canard showed prox 30% RCA and mid 20-30% RCA stenosis. The remaining vessels were unremarkable.   Normal nuclear stress test on 07/05/15.  Troponins are normal. ECG showed sinus tachycardia and old septal infarct (unchanged). Chest xray showed reactive airways disease. Elevated BUN (24) and creatinine (1.89).  ARB (Benicar-HCT) and NSAID held on admission and is on IV fluids. CBC was normal.  Recommendations: Last cath appears to have been done in June 2007. She very well may have had progression of  disease since that time. That being said, she is in acute renal failure and I would avoid coronary angiography until renal function has improved. As her pain is not nitrate-responsive, there are atypical features as  well.  Will plan for Lexiscan Myoview stress test on 12/26/16. If this is unremarkable, I may still consider long-acting nitrates. Continue ASA, Plavix, propranolol, and Crestor.   Kate Sable, MD, [facility]  12/25/2016 12:45 PM

## 2016-12-25 NOTE — Progress Notes (Signed)
PROGRESS NOTE    [name]  UQJ:[id number] DOB: 1955/12/02 DOA: 12/24/2016 PCP: [name], MD     Brief Narrative:  61 year old woman admitted from home on 8/21 due to chest pain. Admission was requested.   Assessment & Plan:   Principal Problem:   Chest pain Active Problems:   DM2 (diabetes mellitus, type 2) (HCC)   Essential hypertension   S/P femoral-popliteal bypass surgery   PAD (peripheral artery disease) (HCC)   AKI (acute kidney injury) (HCC)   Nausea & vomiting   Chest pain -Cath in 2007 showed proximal 30% RCA stenosis. -Stress test in March 2017 was normal. -Has ruled out for ACS, however continues to have chest pain. -Seen by cardiology with plans for repeat stress test tomorrow morning as her coronary artery disease may have progressed since time of last cath over 10 years ago.  Acute renal failure -Continue to hold ARB's, NSAIDs, continue IV fluids.  Insulin-dependent diabetes -Well-controlled, continue current management   DVT prophylaxis: Heparin Code Status: Full code Family Communication: Patient only Disposition Plan: Anticipate discharge home in 24-48 hours  Consultants:   Cardiology  Procedures:   Nuclear medicine stress test scheduled for 8/23  Antimicrobials:  Anti-infectives    None       Subjective: Still with substernal chest pressure, not improved with nitroglycerin, no association with food  Objective: Vitals:   12/24/16 2230 12/24/16 2300 12/25/16 0500 12/25/16 1513  BP: 114/65 108/66 (!) 171/88 (!) 145/76  Pulse: 66 61 (!) 102 81  Resp: 15 16 18 16   Temp:  98.1 F (36.7 C) 98.4 F (36.9 C)   TempSrc:  Oral Oral Oral  SpO2: 96% 99% 95% 95%  Weight:  65.5 kg (144 lb 6.4 oz)    Height:  5\' 3"  (1.6 m)      Intake/Output Summary (Last 24 hours) at 12/25/16 1829 Last data filed at 12/25/16 1700  Gross per 24 hour  Intake             1370 ml  Output                0 ml  Net             1370 ml   Filed  Weights   12/24/16 1456 12/24/16 2300  Weight: 65.8 kg (145 lb) 65.5 kg (144 lb 6.4 oz)    Examination:  General exam: Alert, awake, oriented x 3 Respiratory system: Clear to auscultation. Respiratory effort normal. Cardiovascular system:RRR. No murmurs, rubs, gallops. Gastrointestinal system: Abdomen is nondistended, soft and nontender. No organomegaly or masses felt. Normal bowel sounds heard. Central nervous system: Alert and oriented. No focal neurological deficits. Extremities: No C/C/E, +pedal pulses Skin: No rashes, lesions or ulcers Psychiatry: Judgement and insight appear normal. Mood & affect appropriate.     Data Reviewed: I have personally reviewed following labs and imaging studies  CBC:  Recent Labs Lab 12/24/16 1521  WBC 5.5  HGB 13.2  HCT 40.6  MCV 82.0  PLT 389   Basic Metabolic Panel:  Recent Labs Lab 12/24/16 1521  NA 139  K 3.4*  CL 99*  CO2 29  GLUCOSE 164*  BUN 24*  CREATININE 1.89*  CALCIUM 10.6*   GFR: Estimated Creatinine Clearance: 28.4 mL/min (A) (by C-G formula based on SCr of 1.89 mg/dL (H)). Liver Function Tests: No results for input(s): AST, ALT, ALKPHOS, BILITOT, PROT, ALBUMIN in the last 168 hours. No results for input(s): LIPASE, AMYLASE in the  last 168 hours. No results for input(s): AMMONIA in the last 168 hours. Coagulation Profile: No results for input(s): INR, PROTIME in the last 168 hours. Cardiac Enzymes:  Recent Labs Lab 12/24/16 1521 12/24/16 2331 12/25/16 0207  TROPONINI <0.03 <0.03 <0.03   BNP (last 3 results) No results for input(s): PROBNP in the last 8760 hours. HbA1C: No results for input(s): HGBA1C in the last 72 hours. CBG:  Recent Labs Lab 12/24/16 2320 12/25/16 0732 12/25/16 1113 12/25/16 1630  GLUCAP 104* 166* 99 109*   Lipid Profile: No results for input(s): CHOL, HDL, LDLCALC, TRIG, CHOLHDL, LDLDIRECT in the last 72 hours. Thyroid Function Tests: No results for input(s): TSH,  T4TOTAL, FREET4, T3FREE, THYROIDAB in the last 72 hours. Anemia Panel: No results for input(s): VITAMINB12, FOLATE, FERRITIN, TIBC, IRON, RETICCTPCT in the last 72 hours. Urine analysis:    Component Value Date/Time   COLORURINE YELLOW 06/17/2016 1441   APPEARANCEUR CLEAR 06/17/2016 1441   LABSPEC 1.011 06/17/2016 1441   PHURINE 5.0 06/17/2016 1441   GLUCOSEU NEGATIVE 06/17/2016 1441   HGBUR NEGATIVE 06/17/2016 1441   BILIRUBINUR NEGATIVE 06/17/2016 1441   KETONESUR NEGATIVE 06/17/2016 1441   PROTEINUR NEGATIVE 06/17/2016 1441   UROBILINOGEN 1.0 12/05/2009 1908   NITRITE NEGATIVE 06/17/2016 1441   LEUKOCYTESUR TRACE (A) 06/17/2016 1441   Sepsis Labs: @LABRCNTIP (procalcitonin:4,lacticidven:4)  )No results found for this or any previous visit (from the past 240 hour(s)).       Radiology Studies: Dg Chest 2 View  Result Date: 12/24/2016 CLINICAL DATA:  Bilateral chest pain with shortness of breath which began 3 days ago. Symptoms were intermittent until today. History of coronary artery disease and previous MI, asthma, ovarian malignancy, diabetes EXAM: CHEST  2 VIEW COMPARISON:  PA and lateral chest x-ray of January 18, 2016 FINDINGS: The lungs are well-expanded. There is no focal infiltrate. The interstitial markings are coarse though stable. The heart and pulmonary vascularity are normal. There is calcification in the wall of the aortic arch. There is no pleural effusion. The bony thorax exhibits no acute abnormality. IMPRESSION: Mild interstitial prominence is consistent with known reactive airway disease and the patient's smoking history. There is no acute pneumonia nor CHF. Thoracic aortic atherosclerosis. Electronically Signed   By: David  [name] M.D.   On: 12/24/2016 16:20        Scheduled Meds: . aspirin EC  81 mg Oral Daily  . citalopram  10 mg Oral Daily  . clopidogrel  75 mg Oral QHS  . [START ON 12/31/2016] Dulaglutide  0.75 mg Subcutaneous Q Tue  . [START ON  12/26/2016] enoxaparin (LOVENOX) injection  30 mg Subcutaneous Q24H  . fenofibrate  108 mg Oral Daily  . insulin aspart  5-15 Units Subcutaneous BID AC  . insulin glargine  37 Units Subcutaneous q morning - 10a  . pantoprazole  40 mg Oral Daily  . pantoprazole (PROTONIX) IV  40 mg Intravenous Q12H  . potassium chloride  10 mEq Oral Daily  . propranolol ER  60 mg Oral QPM  . [START ON 12/26/2016] regadenoson  0.4 mg Intravenous Once  . rosuvastatin  40 mg Oral Daily   Continuous Infusions: . sodium chloride 75 mL/hr at 12/25/16 0100     LOS: 0 days    Time spent: 30 minutes. Greater than 50% of this time was spent in direct contact with the patient coordinating care.     Lelon Frohlich, MD [facility] Pager [phone]  If 7PM-7AM, please contact night-coverage [email or web address] Password [email or web address]  12/25/2016, 6:29 PM

## 2016-12-25 NOTE — Care Management Obs Status (Signed)
[place] NOTIFICATION   Patient Details  Name: [name] MRN: [id number] Date of Birth: March 13, 1956   Medicare Observation Status Notification Given:  Yes    Sherald Barge, RN 12/25/2016, 10:39 AM

## 2016-12-26 ENCOUNTER — Encounter (HOSPITAL_COMMUNITY)
Admission: EM | Disposition: A | Discharge status: Home / Self Care | Payer: Self-pay | Source: Home / Self Care | Attending: Emergency Medicine

## 2016-12-26 ENCOUNTER — Encounter (HOSPITAL_COMMUNITY): Payer: Self-pay

## 2016-12-26 ENCOUNTER — Observation Stay (HOSPITAL_BASED_OUTPATIENT_CLINIC_OR_DEPARTMENT_OTHER): Payer: Medicare Other

## 2016-12-26 DIAGNOSIS — R072 Precordial pain: Secondary | ICD-10-CM | POA: Diagnosis not present

## 2016-12-26 DIAGNOSIS — E118 Type 2 diabetes mellitus with unspecified complications: Secondary | ICD-10-CM | POA: Diagnosis not present

## 2016-12-26 DIAGNOSIS — I10 Essential (primary) hypertension: Secondary | ICD-10-CM | POA: Diagnosis not present

## 2016-12-26 DIAGNOSIS — R079 Chest pain, unspecified: Secondary | ICD-10-CM

## 2016-12-26 DIAGNOSIS — R112 Nausea with vomiting, unspecified: Secondary | ICD-10-CM | POA: Diagnosis not present

## 2016-12-26 DIAGNOSIS — I208 Other forms of angina pectoris: Secondary | ICD-10-CM

## 2016-12-26 DIAGNOSIS — N179 Acute kidney failure, unspecified: Secondary | ICD-10-CM | POA: Diagnosis not present

## 2016-12-26 HISTORY — PX: LEFT HEART CATH AND CORONARY ANGIOGRAPHY: CATH118249

## 2016-12-26 LAB — BASIC METABOLIC PANEL
Anion gap: 5 (ref 5–15)
BUN: 19 mg/dL (ref 6–20)
CO2: 28 mmol/L (ref 22–32)
Calcium: 9.5 mg/dL (ref 8.9–10.3)
Chloride: 107 mmol/L (ref 101–111)
Creatinine, Ser: 1.54 mg/dL — ABNORMAL HIGH (ref 0.44–1.00)
GFR calc Af Amer: 41 mL/min — ABNORMAL LOW (ref >60)
GFR calc non Af Amer: 35 mL/min — ABNORMAL LOW (ref >60)
Glucose, Bld: 77 mg/dL (ref 65–99)
Potassium: 3.4 mmol/L — ABNORMAL LOW (ref 3.5–5.1)
Sodium: 140 mmol/L (ref 135–145)

## 2016-12-26 LAB — LIPASE, BLOOD: Lipase: 32 U/L (ref 11–51)

## 2016-12-26 LAB — GLUCOSE, CAPILLARY
Glucose-Capillary: 76 mg/dL (ref 65–99)
Glucose-Capillary: 133 mg/dL — ABNORMAL HIGH (ref 65–99)
Glucose-Capillary: 171 mg/dL — ABNORMAL HIGH (ref 65–99)
Glucose-Capillary: 96 mg/dL (ref 65–99)
Glucose-Capillary: 248 mg/dL — ABNORMAL HIGH (ref 65–99)

## 2016-12-26 LAB — NM MYOCAR MULTI W/SPECT W/WALL MOTION / EF
LV dias vol: 74 mL (ref 46–106)
LV sys vol: 14 mL
Peak HR: 131 {beats}/min
RATE: 0.29
Rest HR: 69 {beats}/min
SDS: 5
SRS: 0
SSS: 5
TID: 0.98

## 2016-12-26 LAB — HEPATIC FUNCTION PANEL
ALT: 42 U/L (ref 14–54)
AST: 61 U/L — ABNORMAL HIGH (ref 15–41)
Albumin: 3.4 g/dL — ABNORMAL LOW (ref 3.5–5.0)
Alkaline Phosphatase: 51 U/L (ref 38–126)
Bilirubin, Direct: 0.2 mg/dL (ref 0.1–0.5)
Indirect Bilirubin: 0.5 mg/dL (ref 0.3–0.9)
Total Bilirubin: 0.7 mg/dL (ref 0.3–1.2)
Total Protein: 6.4 g/dL — ABNORMAL LOW (ref 6.5–8.1)

## 2016-12-26 LAB — HIV ANTIBODY (ROUTINE TESTING W REFLEX): HIV Screen 4th Generation wRfx: NONREACTIVE

## 2016-12-26 SURGERY — LEFT HEART CATH AND CORONARY ANGIOGRAPHY
Anesthesia: LOCAL

## 2016-12-26 MED ORDER — SODIUM CHLORIDE 0.9% FLUSH: 3.0000 mL | INTRAVENOUS | Status: DC | PRN

## 2016-12-26 MED ORDER — SODIUM CHLORIDE 0.9% FLUSH
INTRAVENOUS | Status: AC
  Administered 2016-12-26: 10 mL via INTRAVENOUS
  Filled 2016-12-26: qty 10

## 2016-12-26 MED ORDER — TECHNETIUM TC 99M TETROFOSMIN IV KIT
30.0000 | PACK | Freq: Once | INTRAVENOUS | Status: AC | PRN
  Administered 2016-12-26: 30 via INTRAVENOUS

## 2016-12-26 MED ORDER — DIPHENHYDRAMINE HCL 25 MG PO CAPS: 50.0000 mg | ORAL_CAPSULE | Freq: Once | ORAL | Status: DC

## 2016-12-26 MED ORDER — SODIUM CHLORIDE 0.9 % IV SOLN: 250.0000 mL | INTRAVENOUS | Status: DC | PRN

## 2016-12-26 MED ORDER — AMLODIPINE BESYLATE 5 MG PO TABS
5.0000 mg | ORAL_TABLET | Freq: Every day | ORAL | Status: DC
  Administered 2016-12-26 – 2016-12-28 (×3): 5 mg via ORAL
  Filled 2016-12-26 (×3): qty 1

## 2016-12-26 MED ORDER — HEPARIN SODIUM (PORCINE) 1000 UNIT/ML IJ SOLN
INTRAMUSCULAR | Status: AC
  Filled 2016-12-26: qty 1

## 2016-12-26 MED ORDER — HYDRALAZINE HCL 20 MG/ML IJ SOLN
INTRAMUSCULAR | Status: DC | PRN
  Administered 2016-12-26: 10 mg via INTRAVENOUS

## 2016-12-26 MED ORDER — LIDOCAINE HCL (PF) 1 % IJ SOLN
INTRAMUSCULAR | Status: DC | PRN
  Administered 2016-12-26: 2 mL via SUBCUTANEOUS

## 2016-12-26 MED ORDER — POTASSIUM CHLORIDE CRYS ER 20 MEQ PO TBCR
20.0000 meq | EXTENDED_RELEASE_TABLET | Freq: Every day | ORAL | Status: DC
  Administered 2016-12-26 – 2016-12-28 (×3): 20 meq via ORAL
  Filled 2016-12-26 (×3): qty 1

## 2016-12-26 MED ORDER — NITROGLYCERIN 0.4 MG SL SUBL: 0.4000 mg | SUBLINGUAL_TABLET | SUBLINGUAL | Status: DC | PRN

## 2016-12-26 MED ORDER — METHYLPREDNISOLONE SODIUM SUCC 125 MG IJ SOLR
INTRAMUSCULAR | Status: DC | PRN
  Administered 2016-12-26: 125 mg via INTRAVENOUS

## 2016-12-26 MED ORDER — HEPARIN SODIUM (PORCINE) 1000 UNIT/ML IJ SOLN
INTRAMUSCULAR | Status: DC | PRN
  Administered 2016-12-26: 3500 [IU] via INTRAVENOUS

## 2016-12-26 MED ORDER — HEPARIN (PORCINE) IN NACL 2-0.9 UNIT/ML-% IJ SOLN
INTRAMUSCULAR | Status: AC
  Filled 2016-12-26: qty 1000

## 2016-12-26 MED ORDER — LIDOCAINE HCL (PF) 1 % IJ SOLN
INTRAMUSCULAR | Status: AC
  Filled 2016-12-26: qty 30

## 2016-12-26 MED ORDER — HYDRALAZINE HCL 25 MG PO TABS
25.0000 mg | ORAL_TABLET | Freq: Four times a day (QID) | ORAL | Status: DC | PRN
  Administered 2016-12-26 (×2): 25 mg via ORAL
  Filled 2016-12-26 (×2): qty 1

## 2016-12-26 MED ORDER — DIPHENHYDRAMINE HCL 50 MG/ML IJ SOLN: 50.0000 mg | Freq: Once | INTRAMUSCULAR | Status: DC

## 2016-12-26 MED ORDER — ASPIRIN 81 MG PO CHEW
CHEWABLE_TABLET | ORAL | Status: DC | PRN
  Administered 2016-12-26: 81 mg via ORAL

## 2016-12-26 MED ORDER — SODIUM CHLORIDE 0.9% FLUSH
3.0000 mL | Freq: Two times a day (BID) | INTRAVENOUS | Status: DC
  Administered 2016-12-27 (×2): 3 mL via INTRAVENOUS

## 2016-12-26 MED ORDER — ENOXAPARIN SODIUM 40 MG/0.4ML ~~LOC~~ SOLN
40.0000 mg | SUBCUTANEOUS | Status: DC
  Administered 2016-12-26: 40 mg via SUBCUTANEOUS
  Filled 2016-12-26: qty 0.4

## 2016-12-26 MED ORDER — DIPHENHYDRAMINE HCL 50 MG/ML IJ SOLN
INTRAMUSCULAR | Status: AC
  Filled 2016-12-26: qty 1

## 2016-12-26 MED ORDER — VERAPAMIL HCL 2.5 MG/ML IV SOLN
INTRAVENOUS | Status: DC | PRN
  Administered 2016-12-26: 10 mL via INTRA_ARTERIAL

## 2016-12-26 MED ORDER — METHYLPREDNISOLONE SODIUM SUCC 125 MG IJ SOLR
INTRAMUSCULAR | Status: AC
  Filled 2016-12-26: qty 2

## 2016-12-26 MED ORDER — DIPHENHYDRAMINE HCL 50 MG/ML IJ SOLN
INTRAMUSCULAR | Status: DC | PRN
  Administered 2016-12-26: 50 mg via INTRAVENOUS

## 2016-12-26 MED ORDER — MIDAZOLAM HCL 2 MG/2ML IJ SOLN
INTRAMUSCULAR | Status: AC
  Filled 2016-12-26: qty 2

## 2016-12-26 MED ORDER — SODIUM CHLORIDE 0.9 % WEIGHT BASED INFUSION: 3.0000 mL/kg/h | INTRAVENOUS | Status: DC

## 2016-12-26 MED ORDER — ASPIRIN 81 MG PO CHEW
CHEWABLE_TABLET | ORAL | Status: AC
  Filled 2016-12-26: qty 1

## 2016-12-26 MED ORDER — SODIUM CHLORIDE 0.9% FLUSH: 3.0000 mL | Freq: Two times a day (BID) | INTRAVENOUS | Status: DC

## 2016-12-26 MED ORDER — HEPARIN (PORCINE) IN NACL 2-0.9 UNIT/ML-% IJ SOLN
INTRAMUSCULAR | Status: AC | PRN
  Administered 2016-12-26: 1000 mL

## 2016-12-26 MED ORDER — FENTANYL CITRATE (PF) 100 MCG/2ML IJ SOLN
INTRAMUSCULAR | Status: DC | PRN
Start: 2016-12-26 — End: 2016-12-26
  Administered 2016-12-26: 25 ug via INTRAVENOUS

## 2016-12-26 MED ORDER — TECHNETIUM TC 99M TETROFOSMIN IV KIT
10.0000 | PACK | Freq: Once | INTRAVENOUS | Status: AC | PRN
  Administered 2016-12-26: 10 via INTRAVENOUS

## 2016-12-26 MED ORDER — SODIUM CHLORIDE 0.9 % WEIGHT BASED INFUSION: 1.0000 mL/kg/h | INTRAVENOUS | Status: DC

## 2016-12-26 MED ORDER — FENTANYL CITRATE (PF) 100 MCG/2ML IJ SOLN
INTRAMUSCULAR | Status: AC
  Filled 2016-12-26: qty 2

## 2016-12-26 MED ORDER — REGADENOSON 0.4 MG/5ML IV SOLN
INTRAVENOUS | Status: AC
  Administered 2016-12-26: 0.4 mg via INTRAVENOUS
  Filled 2016-12-26: qty 5

## 2016-12-26 MED ORDER — SODIUM CHLORIDE 0.9 % WEIGHT BASED INFUSION
1.0000 mL/kg/h | INTRAVENOUS | Status: AC
  Administered 2016-12-27: 1 mL/kg/h via INTRAVENOUS

## 2016-12-26 MED ORDER — VERAPAMIL HCL 2.5 MG/ML IV SOLN
INTRAVENOUS | Status: AC
  Filled 2016-12-26: qty 2

## 2016-12-26 MED ORDER — IOPAMIDOL (ISOVUE-370) INJECTION 76%
INTRAVENOUS | Status: DC | PRN
  Administered 2016-12-26: 40 mL via INTRA_ARTERIAL

## 2016-12-26 MED ORDER — MIDAZOLAM HCL 2 MG/2ML IJ SOLN
INTRAMUSCULAR | Status: DC | PRN
  Administered 2016-12-26: 1 mg via INTRAVENOUS

## 2016-12-26 MED ORDER — METHYLPREDNISOLONE SODIUM SUCC 40 MG IJ SOLR: 40.0000 mg | INTRAMUSCULAR | Status: DC

## 2016-12-26 MED ORDER — HYDRALAZINE HCL 20 MG/ML IJ SOLN
INTRAMUSCULAR | Status: AC
  Filled 2016-12-26: qty 1

## 2016-12-26 SURGICAL SUPPLY — 9 items
CATH 5FR JL3.5 JR4 ANG PIG MP (CATHETERS) ×2 IMPLANT
DEVICE RAD COMP TR BAND LRG (VASCULAR PRODUCTS) ×2 IMPLANT
GLIDESHEATH SLEND SS 6F .021 (SHEATH) ×2 IMPLANT
GUIDEWIRE INQWIRE 1.5J.035X260 (WIRE) ×1 IMPLANT
INQWIRE 1.5J .035X260CM (WIRE) ×2
KIT HEART LEFT (KITS) ×2 IMPLANT
PACK CARDIAC CATHETERIZATION (CUSTOM PROCEDURE TRAY) ×2 IMPLANT
TRANSDUCER W/STOPCOCK (MISCELLANEOUS) ×2 IMPLANT
TUBING CIL FLEX 10 FLL-RA (TUBING) ×2 IMPLANT

## 2016-12-26 NOTE — H&P (View-Only) (Signed)
Progress Note  Patient Name: [name] Date of Encounter: 12/26/2016  Primary Cardiologist: [place]   Patient examined in stress lab, prior to [facility]. Continues to have some left sided chest pressure.   Inpatient Medications    Scheduled Meds: . aspirin EC  81 mg Oral Daily  . citalopram  10 mg Oral Daily  . clopidogrel  75 mg Oral QHS  . [START ON 12/31/2016] Dulaglutide  0.75 mg Subcutaneous Q Tue  . enoxaparin (LOVENOX) injection  40 mg Subcutaneous Q24H  . fenofibrate  108 mg Oral Daily  . insulin aspart  5-15 Units Subcutaneous BID AC  . insulin glargine  37 Units Subcutaneous q morning - 10a  . pantoprazole  40 mg Oral Daily  . pantoprazole (PROTONIX) IV  40 mg Intravenous Q12H  . potassium chloride  10 mEq Oral Daily  . propranolol ER  60 mg Oral QPM  . rosuvastatin  40 mg Oral Daily   Continuous Infusions: . sodium chloride 75 mL/hr at 12/25/16 2319   PRN Meds: acetaminophen, ALPRAZolam, cyclobenzaprine, gi cocktail, hydrALAZINE, morphine injection, ondansetron (ZOFRAN) IV, technetium tetrofosmin, technetium tetrofosmin   Vital Signs    Vitals:   12/25/16 1513 12/25/16 2034 12/25/16 2104 12/26/16 0507  BP: (!) 145/76  (!) 169/81 (!) 172/92  Pulse: 81  95 78  Resp: 16  20 16   Temp:   98.6 F (37 C) 98.6 F (37 C)  TempSrc: Oral  Oral   SpO2: 95% 92% 96% 95%  Weight:      Height:        Intake/Output Summary (Last 24 hours) at 12/26/16 0831 Last data filed at 12/25/16 1700  Gross per 24 hour  Intake              720 ml  Output                0 ml  Net              720 ml   Filed Weights   12/24/16 1456 12/24/16 2300  Weight: 145 lb (65.8 kg) 144 lb 6.4 oz (65.5 kg)    Telemetry    NSR with PAC's HR in the 70's. - Personally Reviewed  Physical Exam   GEN: No acute distress.   Neck: No JVD Cardiac: RRR, no murmurs, rubs, or gallops.  Respiratory: Clear to auscultation bilaterally. GI: Soft, nontender,  non-distended  MS: No edema; No deformity. Neuro:  Nonfocal  Psych: Normal affect   Labs    Chemistry Recent Labs Lab 12/24/16 1521 12/26/16 0456  NA 139 140  K 3.4* 3.4*  CL 99* 107  CO2 29 28  GLUCOSE 164* 77  BUN 24* 19  CREATININE 1.89* 1.54*  CALCIUM 10.6* 9.5  GFRNONAA 28* 35*  GFRAA 32* 41*  ANIONGAP 11 5     Hematology Recent Labs Lab 12/24/16 1521  WBC 5.5  RBC 4.95  HGB 13.2  HCT 40.6  MCV 82.0  MCH 26.7  MCHC 32.5  RDW 13.7  PLT 218    Cardiac Enzymes Recent Labs Lab 12/24/16 1521 12/24/16 2331 12/25/16 0207  TROPONINI <0.03 <0.03 <0.03    Recent Labs Lab 12/24/16 1522  TROPIPOC 0.00     BNPNo results for input(s): BNP, PROBNP in the last 168 hours.   DDimer No results for input(s): DDIMER in the last 168 hours.   Radiology    Dg Chest 2 View  Result Date: 12/24/2016 CLINICAL  DATA:  Bilateral chest pain with shortness of breath which began 3 days ago. Symptoms were intermittent until today. History of coronary artery disease and previous MI, asthma, ovarian malignancy, diabetes EXAM: CHEST  2 VIEW COMPARISON:  PA and lateral chest x-ray of January 18, 2016 FINDINGS: The lungs are well-expanded. There is no focal infiltrate. The interstitial markings are coarse though stable. The heart and pulmonary vascularity are normal. There is calcification in the wall of the aortic arch. There is no pleural effusion. The bony thorax exhibits no acute abnormality. IMPRESSION: Mild interstitial prominence is consistent with known reactive airway disease and the patient's smoking history. There is no acute pneumonia nor CHF. Thoracic aortic atherosclerosis. Electronically Signed   By: David  [name] M.D.   On: 12/24/2016 16:20    Cardiac Studies  Echocardiogram 09/15/2012 Left ventricle: The cavity size was normal. There was mild concentric hypertrophy. Systolic function was normal. The estimated ejection fraction was in the range of 60% to 65%.  Wall motion was normal; there were no regional wall motion abnormalities. - Mitral valve: Calcified annulus. Mild regurgitation. - Pulmonary arteries: Systolic pressure was mildly increased. PA peak pressure: 52mm Hg (S).  Stress Myoview 2017 Study Highlights   The left ventricular ejection fraction is normal (55-65%).  Nuclear stress EF: 59%.  There was no ST segment deviation noted during stress.  No T wave inversion was noted during stress.  The study is normal.  This is a low risk study.     Patient Profile     61 y.o. female with history of PAD,  stent to the left SFA in 2016 and was found to have occlusion of the stent on follow-up angiogram 01/2016. She underwent fem pop bypass with PTFE 06/09/16. She also has history of CAD with cath in 2004 mild irregularities in the LAD, 30% proximal RCA, 20-30% mid RCA LVEF 65%. Previous CVA x3, hypertension, HLD, and diabetes mellitus type 2, tobacco abuse-one year ago. Admitted with chest pain, nausea and vomiting.   Assessment & Plan    1. Chest Pain: Continues to have left sided chest pain which is ongoing. Troponin negative X 3. No chest pain during stress test, but significant nausea and dry heaves. No acute ST T wave abnormalities during stress test.   2. Abdominal pain with nausea and vomiting: Continued during stress test. Dry heaves. Will order LFTs, lipase. Increase potassium to 12mEq   3. Hypertension: Patient was hypertensive during stress test. Unusual for Lexiscan. Will follow.   Signed, Phill Myron. West Pugh, ANP, AACC   12/26/2016, 8:31 AM    The patient was seen and examined, and I agree with the history, physical exam, assessment and plan as documented above, with modifications as noted below.  She continues to have retrosternal and left-sided chest pressure, albeit less severe than yesterday.  Nuclear stress test had both ECG and perfusion imaging suggestive of inferior ischemia.  Cath in 10/04/2005 by Dr.  Doylene Canard showed prox 30% RCA and mid 20-30% RCA stenosis. The remaining vessels were unremarkable.   Elevated creatinine (1.54, previously 1.89).  ARB (Benicar-HCT) and NSAID held on admission and is on IV fluids.  Recommendations: Given continued chest discomfort with nuclear stress test suggestive of inferior ischemia, and given prior cath results from 10/2005, she has likely had progression of RCA disease.  Risks and benefits of cardiac catheterization have been discussed with the patient.  These include bleeding, infection, kidney damage, stroke, heart attack, death.  The patient understands these risks  and is willing to proceed.  I will arrange for transfer to [facility] for coronary angiography. Given acute renal failure, she has a higher risk for contrast-mediated nephropathy. Continue IV fluids.  Continue ASA, Plavix, propranolol, and Crestor. I will add amlodipine for both hypertension and anginal management.   Kate Sable, MD, [facility]  12/26/2016 10:11 AM

## 2016-12-26 NOTE — Interval H&P Note (Signed)
History and Physical Interval Note:  12/26/2016 6:16 PM  [name]  has presented today for surgery, with the diagnosis of abnormal stress test  The various methods of treatment have been discussed with the patient and family. After consideration of risks, benefits and other options for treatment, the patient has consented to  Procedure(s): LEFT HEART CATH AND CORONARY ANGIOGRAPHY (N/A) as a surgical intervention .  The patient's history has been reviewed, patient examined, no change in status, stable for surgery.  I have reviewed the patient's chart and labs.  Questions were answered to the patient's satisfaction.   Cath Lab Visit (complete for each Cath Lab visit)  Clinical Evaluation Leading to the Procedure:   ACS: Yes.    Non-ACS:    Anginal Classification: CCS III  Anti-ischemic medical therapy: Minimal Therapy (1 class of medications)  Non-Invasive Test Results: Intermediate-risk stress test findings: cardiac mortality 1-3%/year  Prior CABG: No previous CABG        Philomene Haff [name] MD,FACC 12/26/2016 6:16 PM

## 2016-12-26 NOTE — Progress Notes (Signed)
Report given to [facility] RN Angelia. Pt transported to Cath lab [facility]

## 2016-12-26 NOTE — Progress Notes (Signed)
Cardiac diet per verbal order from physician. Order placed by RN.

## 2016-12-26 NOTE — Progress Notes (Signed)
Patient briefly seen and examined. Care discussed with Dr. Bronson Ing. Patient has inferior ischemia on nuclear medicine stress test and plan is to transfer to [facility] for heart catheterization. Cardiology will assume care. Please call us back with questions.  Domingo Mend, MD [facility] Pager: [phone]

## 2016-12-26 NOTE — Progress Notes (Signed)
New Admission Note:   Arrival Method: From cath lab with 2 cath nurses, ambulated to unit bed Mental Orientation: A&O x4 Telemetry: initiated, verified, box 17 Assessment: Skin: WNL, right radial is level 0, scar on inner left thigh. Pain: 5/10, headache  Orders to be reviewed and implemented. Will continue to monitor the patient. Call light has been placed within reach and bed alarm has been activated.   Riki Altes, RN Phone: [phone]

## 2016-12-26 NOTE — Progress Notes (Signed)
Progress Note  Patient Name: [name] Date of Encounter: 12/26/2016  Primary Cardiologist: [place]   Patient examined in stress lab, prior to [facility]. Continues to have some left sided chest pressure.   Inpatient Medications    Scheduled Meds: . aspirin EC  81 mg Oral Daily  . citalopram  10 mg Oral Daily  . clopidogrel  75 mg Oral QHS  . [START ON 12/31/2016] Dulaglutide  0.75 mg Subcutaneous Q Tue  . enoxaparin (LOVENOX) injection  40 mg Subcutaneous Q24H  . fenofibrate  108 mg Oral Daily  . insulin aspart  5-15 Units Subcutaneous BID AC  . insulin glargine  37 Units Subcutaneous q morning - 10a  . pantoprazole  40 mg Oral Daily  . pantoprazole (PROTONIX) IV  40 mg Intravenous Q12H  . potassium chloride  10 mEq Oral Daily  . propranolol ER  60 mg Oral QPM  . rosuvastatin  40 mg Oral Daily   Continuous Infusions: . sodium chloride 75 mL/hr at 12/25/16 2319   PRN Meds: acetaminophen, ALPRAZolam, cyclobenzaprine, gi cocktail, hydrALAZINE, morphine injection, ondansetron (ZOFRAN) IV, technetium tetrofosmin, technetium tetrofosmin   Vital Signs    Vitals:   12/25/16 1513 12/25/16 2034 12/25/16 2104 12/26/16 0507  BP: (!) 145/76  (!) 169/81 (!) 172/92  Pulse: 81  95 78  Resp: 16  20 16   Temp:   98.6 F (37 C) 98.6 F (37 C)  TempSrc: Oral  Oral   SpO2: 95% 92% 96% 95%  Weight:      Height:        Intake/Output Summary (Last 24 hours) at 12/26/16 0831 Last data filed at 12/25/16 1700  Gross per 24 hour  Intake              720 ml  Output                0 ml  Net              720 ml   Filed Weights   12/24/16 1456 12/24/16 2300  Weight: 145 lb (65.8 kg) 144 lb 6.4 oz (65.5 kg)    Telemetry    NSR with PAC's HR in the 70's. - Personally Reviewed  Physical Exam   GEN: No acute distress.   Neck: No JVD Cardiac: RRR, no murmurs, rubs, or gallops.  Respiratory: Clear to auscultation bilaterally. GI: Soft, nontender,  non-distended  MS: No edema; No deformity. Neuro:  Nonfocal  Psych: Normal affect   Labs    Chemistry Recent Labs Lab 12/24/16 1521 12/26/16 0456  NA 139 140  K 3.4* 3.4*  CL 99* 107  CO2 29 28  GLUCOSE 164* 77  BUN 24* 19  CREATININE 1.89* 1.54*  CALCIUM 10.6* 9.5  GFRNONAA 28* 35*  GFRAA 32* 41*  ANIONGAP 11 5     Hematology Recent Labs Lab 12/24/16 1521  WBC 5.5  RBC 4.95  HGB 13.2  HCT 40.6  MCV 82.0  MCH 26.7  MCHC 32.5  RDW 13.7  PLT 218    Cardiac Enzymes Recent Labs Lab 12/24/16 1521 12/24/16 2331 12/25/16 0207  TROPONINI <0.03 <0.03 <0.03    Recent Labs Lab 12/24/16 1522  TROPIPOC 0.00     BNPNo results for input(s): BNP, PROBNP in the last 168 hours.   DDimer No results for input(s): DDIMER in the last 168 hours.   Radiology    Dg Chest 2 View  Result Date: 12/24/2016 CLINICAL  DATA:  Bilateral chest pain with shortness of breath which began 3 days ago. Symptoms were intermittent until today. History of coronary artery disease and previous MI, asthma, ovarian malignancy, diabetes EXAM: CHEST  2 VIEW COMPARISON:  PA and lateral chest x-ray of January 18, 2016 FINDINGS: The lungs are well-expanded. There is no focal infiltrate. The interstitial markings are coarse though stable. The heart and pulmonary vascularity are normal. There is calcification in the wall of the aortic arch. There is no pleural effusion. The bony thorax exhibits no acute abnormality. IMPRESSION: Mild interstitial prominence is consistent with known reactive airway disease and the patient's smoking history. There is no acute pneumonia nor CHF. Thoracic aortic atherosclerosis. Electronically Signed   By: David  [name] M.D.   On: 12/24/2016 16:20    Cardiac Studies  Echocardiogram 09/15/2012 Left ventricle: The cavity size was normal. There was mild concentric hypertrophy. Systolic function was normal. The estimated ejection fraction was in the range of 60% to 65%.  Wall motion was normal; there were no regional wall motion abnormalities. - Mitral valve: Calcified annulus. Mild regurgitation. - Pulmonary arteries: Systolic pressure was mildly increased. PA peak pressure: 66mm Hg (S).  Stress Myoview 2017 Study Highlights   The left ventricular ejection fraction is normal (55-65%).  Nuclear stress EF: 59%.  There was no ST segment deviation noted during stress.  No T wave inversion was noted during stress.  The study is normal.  This is a low risk study.     Patient Profile     61 y.o. female with history of PAD,  stent to the left SFA in 2016 and was found to have occlusion of the stent on follow-up angiogram 01/2016. She underwent fem pop bypass with PTFE 06/09/16. She also has history of CAD with cath in 2004 mild irregularities in the LAD, 30% proximal RCA, 20-30% mid RCA LVEF 65%. Previous CVA x3, hypertension, HLD, and diabetes mellitus type 2, tobacco abuse-one year ago. Admitted with chest pain, nausea and vomiting.   Assessment & Plan    1. Chest Pain: Continues to have left sided chest pain which is ongoing. Troponin negative X 3. No chest pain during stress test, but significant nausea and dry heaves. No acute ST T wave abnormalities during stress test.   2. Abdominal pain with nausea and vomiting: Continued during stress test. Dry heaves. Will order LFTs, lipase. Increase potassium to 17mEq   3. Hypertension: Patient was hypertensive during stress test. Unusual for Lexiscan. Will follow.   Signed, Phill Myron. West Pugh, ANP, AACC   12/26/2016, 8:31 AM    The patient was seen and examined, and I agree with the history, physical exam, assessment and plan as documented above, with modifications as noted below.  She continues to have retrosternal and left-sided chest pressure, albeit less severe than yesterday.  Nuclear stress test had both ECG and perfusion imaging suggestive of inferior ischemia.  Cath in 10/04/2005 by Dr.  Doylene Canard showed prox 30% RCA and mid 20-30% RCA stenosis. The remaining vessels were unremarkable.   Elevated creatinine (1.54, previously 1.89).  ARB (Benicar-HCT) and NSAID held on admission and is on IV fluids.  Recommendations: Given continued chest discomfort with nuclear stress test suggestive of inferior ischemia, and given prior cath results from 10/2005, she has likely had progression of RCA disease.  Risks and benefits of cardiac catheterization have been discussed with the patient.  These include bleeding, infection, kidney damage, stroke, heart attack, death.  The patient understands these risks  and is willing to proceed.  I will arrange for transfer to [facility] for coronary angiography. Given acute renal failure, she has a higher risk for contrast-mediated nephropathy. Continue IV fluids.  Continue ASA, Plavix, propranolol, and Crestor. I will add amlodipine for both hypertension and anginal management.   Kate Sable, MD, [facility]  12/26/2016 10:11 AM

## 2016-12-27 ENCOUNTER — Encounter (HOSPITAL_COMMUNITY): Payer: Self-pay | Admitting: Cardiology

## 2016-12-27 ENCOUNTER — Observation Stay (HOSPITAL_COMMUNITY): Payer: Medicare Other

## 2016-12-27 DIAGNOSIS — R109 Unspecified abdominal pain: Secondary | ICD-10-CM | POA: Diagnosis not present

## 2016-12-27 DIAGNOSIS — R072 Precordial pain: Secondary | ICD-10-CM | POA: Diagnosis not present

## 2016-12-27 LAB — GLUCOSE, CAPILLARY
Glucose-Capillary: 128 mg/dL — ABNORMAL HIGH (ref 65–99)
Glucose-Capillary: 195 mg/dL — ABNORMAL HIGH (ref 65–99)
Glucose-Capillary: 127 mg/dL — ABNORMAL HIGH (ref 65–99)
Glucose-Capillary: 222 mg/dL — ABNORMAL HIGH (ref 65–99)

## 2016-12-27 LAB — BASIC METABOLIC PANEL
Anion gap: 7 (ref 5–15)
BUN: 18 mg/dL (ref 6–20)
CO2: 23 mmol/L (ref 22–32)
Calcium: 9.6 mg/dL (ref 8.9–10.3)
Chloride: 108 mmol/L (ref 101–111)
Creatinine, Ser: 1.37 mg/dL — ABNORMAL HIGH (ref 0.44–1.00)
GFR calc Af Amer: 47 mL/min — ABNORMAL LOW (ref >60)
GFR calc non Af Amer: 41 mL/min — ABNORMAL LOW (ref >60)
Glucose, Bld: 189 mg/dL — ABNORMAL HIGH (ref 65–99)
Potassium: 3.6 mmol/L (ref 3.5–5.1)
Sodium: 138 mmol/L (ref 135–145)

## 2016-12-27 LAB — LIPASE, BLOOD: Lipase: 26 U/L (ref 11–51)

## 2016-12-27 LAB — AMYLASE: Amylase: 33 U/L (ref 28–100)

## 2016-12-27 MED ORDER — SUMATRIPTAN SUCCINATE 50 MG PO TABS: 50.0000 mg | ORAL_TABLET | Freq: Once | ORAL | Status: DC | PRN

## 2016-12-27 MED ORDER — METOCLOPRAMIDE HCL 5 MG/ML IJ SOLN: 10.0000 mg | Freq: Once | INTRAMUSCULAR | Status: DC | PRN

## 2016-12-27 MED ORDER — BUTALBITAL-APAP-CAFFEINE 50-325-40 MG PO TABS
2.0000 | ORAL_TABLET | Freq: Once | ORAL | Status: AC | PRN
  Administered 2016-12-27: 2 via ORAL
  Filled 2016-12-27: qty 2

## 2016-12-27 NOTE — Progress Notes (Signed)
Inpatient Diabetes Program Recommendations  AACE/ADA: New Consensus Statement on Inpatient Glycemic Control (2015)  Target Ranges:  Prepandial:   less than 140 mg/dL      Peak postprandial:   less than 180 mg/dL (1-2 hours)      Critically ill patients:  140 - 180 mg/dL   Lab Results  Component Value Date   GLUCAP 128 (H) 12/27/2016   HGBA1C 7.3 (H) 06/19/2016    Review of Glycemic Control  Inpatient Diabetes Program Recommendations:    -A1c to determine prehospital glycemic control -50% basal insulin while in the hospital  -Change diet to include carb modified  Thank you, Jennifer Roys E. Karrie Fluellen, RN, MSN, CDE  Diabetes Coordinator Inpatient Glycemic Control Team Team Pager [phone] (8am-5pm) 12/27/2016 9:45 AM

## 2016-12-27 NOTE — Progress Notes (Signed)
Progress Note  Patient Name: [name] Date of Encounter: 12/27/2016  Primary Cardiologist: Dr. Gwenlyn Found   Subjective   Complains of intermittent left sided chest pain. Also has intermittent abdominal pain. No shortness of breath.   Inpatient Medications    Scheduled Meds: . amLODipine  5 mg Oral Daily  . aspirin EC  81 mg Oral Daily  . citalopram  10 mg Oral Daily  . clopidogrel  75 mg Oral QHS  . [START ON 12/31/2016] Dulaglutide  0.75 mg Subcutaneous Q Tue  . fenofibrate  108 mg Oral Daily  . insulin aspart  5-15 Units Subcutaneous BID AC  . pantoprazole  40 mg Oral Daily  . pantoprazole (PROTONIX) IV  40 mg Intravenous Q12H  . potassium chloride  20 mEq Oral Daily  . propranolol ER  60 mg Oral QPM  . rosuvastatin  40 mg Oral Daily  . sodium chloride flush  3 mL Intravenous Q12H   Continuous Infusions: . sodium chloride     PRN Meds: sodium chloride, acetaminophen, ALPRAZolam, cyclobenzaprine, gi cocktail, hydrALAZINE, morphine injection, nitroGLYCERIN, ondansetron (ZOFRAN) IV, sodium chloride flush   Vital Signs    Vitals:   12/26/16 2219 12/27/16 0111 12/27/16 0642 12/27/16 0926  BP: (!) 146/85 (!) 142/79 139/76 (!) 142/76  Pulse: 95 94 77 92  Resp: 18 20 20 20   Temp: 98.4 F (36.9 C) 97.7 F (36.5 C) 97.8 F (36.6 C) 97.6 F (36.4 C)  TempSrc: Oral Oral Oral Oral  SpO2: 100% 97% 98% 98%  Weight: 143 lb 4.8 oz (65 kg)  142 lb 12.8 oz (64.8 kg)   Height: 5\' 3"  (1.6 m)       Intake/Output Summary (Last 24 hours) at 12/27/16 1016 Last data filed at 12/27/16 0927  Gross per 24 hour  Intake           919.94 ml  Output             1100 ml  Net          -180.06 ml   Filed Weights   12/24/16 2300 12/26/16 2219 12/27/16 0642  Weight: 144 lb 6.4 oz (65.5 kg) 143 lb 4.8 oz (65 kg) 142 lb 12.8 oz (64.8 kg)    Telemetry    Sinus rhythm with rate of mostly in 60-80s. Intermittently goes to low 40s and in high 120s. Frequent PACs. - Personally  Reviewed  ECG    None today   Physical Exam   GEN: No acute distress.   Neck: No JVD Cardiac: RRR, no murmurs, rubs, or gallops. R radial without hematoma. Reproducible chest pain with palpation to  left upper chest.  Respiratory: Clear to auscultation bilaterally. GI: Soft, non-distended, diffuse tender to palpation throughout mostly mid lower quandrant MS: No edema; No deformity. Neuro:  Nonfocal  Psych: Normal affect   Labs    Chemistry Recent Labs Lab 12/24/16 1521 12/26/16 0456 12/26/16 0854  NA 139 140  --   K 3.4* 3.4*  --   CL 99* 107  --   CO2 29 28  --   GLUCOSE 164* 77  --   BUN 24* 19  --   CREATININE 1.89* 1.54*  --   CALCIUM 10.6* 9.5  --   PROT  --   --  6.4*  ALBUMIN  --   --  3.4*  AST  --   --  61*  ALT  --   --  42  ALKPHOS  --   --  3  BILITOT  --   --  0.7  GFRNONAA 28* 35*  --   GFRAA 32* 41*  --   ANIONGAP 11 5  --      Hematology Recent Labs Lab 12/24/16 1521  WBC 5.5  RBC 4.95  HGB 13.2  HCT 40.6  MCV 82.0  MCH 26.7  MCHC 32.5  RDW 13.7  PLT 218    Cardiac Enzymes Recent Labs Lab 12/24/16 1521 12/24/16 2331 12/25/16 0207  TROPONINI <0.03 <0.03 <0.03    Recent Labs Lab 12/24/16 1522  TROPIPOC 0.00    Radiology    Nm Myocar Multi W/spect W/wall Motion / Ef  Result Date: 12/26/2016  Defect 1: There is a medium defect of moderate severity present in the basal inferoseptal, basal inferior, mid inferoseptal, mid inferior, apical septal and apical inferior location.  Findings consistent with ischemia, although soft tissue attenuation artifact is also contributing to this defect.  This is an intermediate risk study.  Nuclear stress EF: 80%.  T wave inversions in III and aVF at baseline, with nonspecific ST segment depressions in II, III, and aVF with stress.     Cardiac Studies   Stress test 12/26/16  Defect 1: There is a medium defect of moderate severity present in the basal inferoseptal, basal inferior, mid  inferoseptal, mid inferior, apical septal and apical inferior location.  Findings consistent with ischemia, although soft tissue attenuation artifact is also contributing to this defect.  This is an intermediate risk study.  Nuclear stress EF: 80%.  T wave inversions in III and aVF at baseline, with nonspecific ST segment depressions in II, III, and aVF with stress.  Patient Profile     61 y.o. 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  She initially presented to [facility] with chest pain, nausea and vomiting. Troponin negative X 3. She continues to have retrosternal and left-sided chest pressure. Nuclear stress test had both ECG and perfusion imaging suggestive of inferior ischemia. Transferred to [facility] for cath.    Assessment & Plan    1. Chest pain  - Troponin negative. Abnormal stress test. Cath showed mild non obstructive CAD as noted above. Mild elevated LVEDP. Lungs clear on exam.  - Reproducible chest pain with palpation to  left upper chest. Also complains of abdominal pain. She had nausea and vomiting on initial presentation as well. Hx of IBS and prior pancreatitis. AST is minimally elevated. differential includes MSK vs GI etiology.  - Will get Amylase and lipase. Had good BM.  - Continue ASA, Plavix (due to PAD) and statin.   2. CKD, stage III - ARB (Benicar-HCT) and NSAID held on admission due to elevated Scr.  Scr was 1.54 yesterday, improved from 1.89. Pending BMET today.   3. HTN - Due to elevated BP amlodipine is added to regimen. Continue BB.  Held ARB/HCT as above. BP minimally elevated. Pending BMET--> depending on result, consider up titrating  amlodipine vs resuming benicar-HCT.   4. Elevated AST  - May consider holding statin   Signed, [place], [place]  12/27/2016, 10:16 AM    Patient seen, examined. Available data reviewed. Agree with findings, assessment, and plan as outlined by Robbie Lis,  PA-C. On exam the patient is alert and oriented in no distress. Lungs are clear. Heart is regular rate and rhythm without murmur or gallop. JVP is normal. Abdomen is soft with mild diffuse tenderness most notable in the right upper quadrant. There is no rebound or guarding. Bowel sounds are present. Extremities with no edema.  Cardiac cath data reviewed. Patient with minor nonobstructive CAD and normal LV pressure. Her chest pain does not appear cardiac in nature. Her laboratory data is reviewed and she did have acute kidney injury that is now improved with holding her ARB, diuretic, and nonsteroidal anti-inflammatory. I would continue to hold these at the present time. It would be reasonable to increase amlodipine for better blood pressure control. With her history of pancreatitis, pancreatic enzymes are checked and they are normal. Her LFTs are mildly elevated. Will obtain an abdominal ultrasound to further assess. If all of her studies are okay and she is clinically stable I would anticipate hospital discharge tomorrow.  Sherren Mocha, M.D. 12/27/2016 2:52 PM

## 2016-12-28 DIAGNOSIS — R072 Precordial pain: Secondary | ICD-10-CM | POA: Diagnosis not present

## 2016-12-28 DIAGNOSIS — I739 Peripheral vascular disease, unspecified: Secondary | ICD-10-CM | POA: Diagnosis not present

## 2016-12-28 DIAGNOSIS — R1084 Generalized abdominal pain: Secondary | ICD-10-CM | POA: Diagnosis not present

## 2016-12-28 DIAGNOSIS — R079 Chest pain, unspecified: Secondary | ICD-10-CM | POA: Diagnosis not present

## 2016-12-28 DIAGNOSIS — N179 Acute kidney failure, unspecified: Secondary | ICD-10-CM | POA: Diagnosis not present

## 2016-12-28 DIAGNOSIS — I10 Essential (primary) hypertension: Secondary | ICD-10-CM | POA: Diagnosis not present

## 2016-12-28 DIAGNOSIS — Z95828 Presence of other vascular implants and grafts: Secondary | ICD-10-CM | POA: Diagnosis not present

## 2016-12-28 DIAGNOSIS — R112 Nausea with vomiting, unspecified: Secondary | ICD-10-CM | POA: Diagnosis not present

## 2016-12-28 LAB — GLUCOSE, CAPILLARY
Glucose-Capillary: 175 mg/dL — ABNORMAL HIGH (ref 65–99)
Glucose-Capillary: 173 mg/dL — ABNORMAL HIGH (ref 65–99)

## 2016-12-28 MED ORDER — KLOR-CON 10 10 MEQ PO TBCR: 20.0000 meq | EXTENDED_RELEASE_TABLET | Freq: Every day | ORAL | 11 refills | Status: DC

## 2016-12-28 MED ORDER — AMLODIPINE BESYLATE 5 MG PO TABS: 5.0000 mg | ORAL_TABLET | Freq: Every day | ORAL | 11 refills | Status: DC

## 2016-12-28 NOTE — Progress Notes (Signed)
Progress Note  Patient Name: [name] Date of Encounter: 12/28/2016  Primary Cardiologist: Dr. Gwenlyn Found   Subjective   Chest pain has improved significantly, she has mild persistent nausea, tolerates food. Wishes to go home.  Inpatient Medications    Scheduled Meds: . amLODipine  5 mg Oral Daily  . aspirin EC  81 mg Oral Daily  . citalopram  10 mg Oral Daily  . clopidogrel  75 mg Oral QHS  . [START ON 12/31/2016] Dulaglutide  0.75 mg Subcutaneous Q Tue  . fenofibrate  108 mg Oral Daily  . insulin aspart  5-15 Units Subcutaneous BID AC  . pantoprazole  40 mg Oral Daily  . potassium chloride  20 mEq Oral Daily  . propranolol ER  60 mg Oral QPM  . rosuvastatin  40 mg Oral Daily  . sodium chloride flush  3 mL Intravenous Q12H   Continuous Infusions: . sodium chloride     PRN Meds: sodium chloride, acetaminophen, ALPRAZolam, cyclobenzaprine, gi cocktail, hydrALAZINE, metoCLOPramide (REGLAN) injection, morphine injection, nitroGLYCERIN, ondansetron (ZOFRAN) IV, sodium chloride flush, SUMAtriptan   Vital Signs    Vitals:   12/27/16 1242 12/27/16 1533 12/27/16 1939 12/28/16 0524  BP: (!) 153/63 (!) 155/71 (!) 122/93 (!) 120/58  Pulse: (!) 55 78 73 (!) 56  Resp:   18 18  Temp: 98 F (36.7 C)  (!) 97.5 F (36.4 C) 98.5 F (36.9 C)  TempSrc: Oral  Oral Oral  SpO2: 100%  100% 100%  Weight:    144 lb 11.2 oz (65.6 kg)  Height:        Intake/Output Summary (Last 24 hours) at 12/28/16 1027 Last data filed at 12/28/16 0934  Gross per 24 hour  Intake              720 ml  Output             1620 ml  Net             -900 ml   Filed Weights   12/26/16 2219 12/27/16 0642 12/28/16 0524  Weight: 143 lb 4.8 oz (65 kg) 142 lb 12.8 oz (64.8 kg) 144 lb 11.2 oz (65.6 kg)    Telemetry    Sinus rhythm with rate of mostly in 60-80s. Intermittently goes to low 40s and in high 120s. Frequent PACs. - Personally Reviewed  ECG    None today   Physical Exam   GEN: No acute  distress.   Neck: No JVD Cardiac: RRR, no murmurs, rubs, or gallops. R radial without hematoma. Reproducible chest pain with palpation to  left upper chest.  Respiratory: Clear to auscultation bilaterally. GI: Soft, non-distended,minimal tenderness throughout mostly mid lower quandrant MS: No edema; No deformity. Neuro:  Nonfocal  Psych: Normal affect   Labs    Chemistry  Recent Labs Lab 12/24/16 1521 12/26/16 0456 12/26/16 0854 12/27/16 1131  NA 139 140  --  138  K 3.4* 3.4*  --  3.6  CL 99* 107  --  108  CO2 29 28  --  23  GLUCOSE 164* 77  --  189*  BUN 24* 19  --  18  CREATININE 1.89* 1.54*  --  1.37*  CALCIUM 10.6* 9.5  --  9.6  PROT  --   --  6.4*  --   ALBUMIN  --   --  3.4*  --   AST  --   --  61*  --   ALT  --   --  42  --   ALKPHOS  --   --  51  --   BILITOT  --   --  0.7  --   GFRNONAA 28* 35*  --  41*  GFRAA 32* 41*  --  47*  ANIONGAP 11 5  --  7     Hematology  Recent Labs Lab 12/24/16 1521  WBC 5.5  RBC 4.95  HGB 13.2  HCT 40.6  MCV 82.0  MCH 26.7  MCHC 32.5  RDW 13.7  PLT 218    Cardiac Enzymes  Recent Labs Lab 12/24/16 1521 12/24/16 2331 12/25/16 0207  TROPONINI <0.03 <0.03 <0.03     Recent Labs Lab 12/24/16 1522  TROPIPOC 0.00    Radiology    US Abdomen Complete  Result Date: 12/28/2016 CLINICAL DATA:  Abdominal pain. EXAM: ABDOMEN ULTRASOUND COMPLETE COMPARISON:  Abdominal CT 07/26/2015 FINDINGS: Gallbladder: Surgically absent. Common bile duct: Diameter: 7 mm, normal for postcholecystectomy. Liver: No focal lesion identified. Liver parenchyma is heterogeneous and mildly increased compared to right kidney. Portal vein is patent on color Doppler imaging with normal direction of blood flow towards the liver. IVC: No abnormality visualized. Pancreas: Visualized portion unremarkable. Spleen: Size and appearance within normal limits. Right Kidney: Length: 11.8 cm. Echogenicity within normal limits. No mass or hydronephrosis  visualized. Left Kidney: Length: 12.4 cm. Echogenicity within normal limits. No mass or hydronephrosis visualized. Abdominal aorta: No aneurysm visualized. Mid and distal aorta are obscured by bowel gas. Other findings: None.  No ascites. IMPRESSION: 1. Postcholecystectomy.  No biliary dilatation. 2. Heterogeneous increased hepatic parenchymal echotexture suggesting steatosis. 3. Otherwise unremarkable abdominal ultrasound. Electronically Signed   By: Jeb Levering M.D.   On: 12/28/2016 03:27    Cardiac Studies   Stress test 12/26/16  Defect 1: There is a medium defect of moderate severity present in the basal inferoseptal, basal inferior, mid inferoseptal, mid inferior, apical septal and apical inferior location.  Findings consistent with ischemia, although soft tissue attenuation artifact is also contributing to this defect.  This is an intermediate risk study.  Nuclear stress EF: 80%.  T wave inversions in III and aVF at baseline, with nonspecific ST segment depressions in II, III, and aVF with stress.    Patient Profile     61 y.o. 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  She initially presented to [facility] with chest pain, nausea and vomiting. Troponin negative X 3. She continues to have retrosternal and left-sided chest pressure. Nuclear stress test had both ECG and perfusion imaging suggestive of inferior ischemia. Transferred to [facility] for cath.    Assessment & Plan    1. Chest pain  - Troponin negative. Abnormal stress test. Cath showed mild non obstructive CAD as noted above. Mild elevated LVEDP. Lungs clear on exam.  - Reproducible chest pain with  palpation to  left upper chest. Also complains of abdominal pain. She had nausea and vomiting on initial presentation as well. Hx of IBS and prior pancreatitis. AST is minimally elevated. differential includes MSK vs GI etiology. Amylase and lipase norma, now able to eat. Had good BM.  - Continue ASA, Plavix (due to PAD) and statin.   2. CKD, stage  III - ARB (Benicar-HCT) and NSAID held on admission due to elevated Scr.  Crea improving from 1.89->1.5-> 1.37.   3. HTN - Due to elevated BP amlodipine is added to regimen. Continue BB.  Held ARB/HCT as above. BP minimally elevated. Pending BMET--> depending on result, consider up titrating amlodipine vs resuming benicar-HCT.   4. Elevated AST  - Decrease crestor to 20 mg po daily.  Stable for a discharge, we will arrange for a follow up within 7-10 days.   Sherren Mocha, M.D. 12/28/2016 10:27 AM

## 2016-12-28 NOTE — Discharge Summary (Signed)
Discharge Summary    Patient ID: [name],  MRN: [id number], DOB/AGE: [birth year] 62 y.o.  Admit date: 12/24/2016 Discharge date: 12/28/2016  Primary Care Provider: [name] Primary Cardiologist: Dr Gwenlyn Found  Discharge Diagnoses    Principal Problem:   Precordial chest pain Active Problems:   DM2 (diabetes mellitus, type 2) ([place])   Essential hypertension   S/P femoral-popliteal bypass surgery   PAD (peripheral artery disease) ([facility])   AKI (acute kidney injury) ([facility])   Nausea & vomiting   Abdominal pain   Allergies Allergies  Allergen Reactions  . Sulfa Antibiotics Shortness Of Breath and Palpitations  . 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 Fish Allergy Hives and Swelling    Tongue swelling  . Iodine Swelling  . Metformin And Related Diarrhea  . Adhesive [Tape] Rash    Paper tape is ok  . Shellfish Allergy Swelling and Rash    Tongue swelling    Diagnostic Studies/Procedures    CARDIAC CATH: 12/26/2016  Prox LAD to Mid LAD lesion, 20 %stenosed.  Prox RCA to Mid RCA lesion, 25 %stenosed.  LV end diastolic pressure is mildly elevated.   1. Mild nonobstructive CAD 2. Mildly elevated LVEDP  Plan: medical therapy. _____________   History of Present Illness     61 y.o. 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  She initially presented to [facility] with chest pain, nausea and vomiting. Troponin negative X 3. She continues to have retrosternal and left-sided chest pressure. Nuclear stress test had both ECG and perfusion imaging suggestive of inferior ischemia. Transferred to [facility] for  cath.   Hospital Course     Consultants:  None  1. Chest pain  - Troponin negative. Abnormal stress test.Cardiac cath results above, mild disease with a mildly elevated LVEDP. Her chest pain was reproducible with palpation to  left upper chest. Also complains of abdominal pain. She had nausea and vomiting on initial presentation as well. Hx of IBS and prior pancreatitis. AST is minimally elevated. differential includes MSK vs GI etiology. Amylase and lipase norma, now able to eat. Had good BM.  - Continue ASA, Plavix (due to PAD) and statin.   2. CKD, stage III - ARB (Benicar-HCT) and NSAID held on admission due to elevated Scr.  Crea improving from 1.89->1.5-> 1.37. Check at [facility] office visit.   3. HTN - Due to elevated BP amlodipine is added to regimen. Continue BB.  Held ARB/HCT as above. BP minimally elevated. Consider up titrating amlodipine vs resuming benicar-HCT as an outpatient.   4. Elevated AST  - Decrease crestor to 20 mg po daily.  5. Hypokalemia - her K+ was 3.4 on admission. Her home potassium was increased but she will go home without the Klor-Con because she is not currently on a diuretic.   Stable for a discharge, we will arrange for a follow up within 7-10 days.   _____________  Discharge Vitals Blood pressure (!) 141/65, pulse 70, temperature 98 F (36.7 C), temperature source Oral, resp. rate 20, height 5\' 3"  (1.6 m), weight 144 lb 11.2 oz (65.6 kg), SpO2 99 %.  Filed Weights  12/26/16 2219 12/27/16 0642 12/28/16 0524  Weight: 143 lb 4.8 oz (65 kg) 142 lb 12.8 oz (64.8 kg) 144 lb 11.2 oz (65.6 kg)    Labs & Radiologic Studies    CBC    Component Value Date/Time   WBC 5.5 12/24/2016 1521   RBC 4.95 12/24/2016 1521   HGB 13.2 12/24/2016 1521   HCT 40.6 12/24/2016 1521   PLT 218 12/24/2016 1521   MCV 82.0 12/24/2016 1521   MCH 26.7 12/24/2016 1521   MCHC 32.5 12/24/2016 1521   RDW 13.7 12/24/2016 1521   LYMPHSABS 2,173 01/18/2016 1428   MONOABS  477 01/18/2016 1428   EOSABS 53 01/18/2016 1428   BASOSABS 0 01/18/2016 8315    Basic Metabolic Panel  Recent Labs  12/26/16 0456 12/27/16 1131  NA 140 138  K 3.4* 3.6  CL 107 108  CO2 28 23  GLUCOSE 77 189*  BUN 19 18  CREATININE 1.54* 1.37*  CALCIUM 9.5 9.6   Liver Function Tests  Recent Labs  12/26/16 0854  AST 61*  ALT 42  ALKPHOS 51  BILITOT 0.7  PROT 6.4*  ALBUMIN 3.4*    Recent Labs  12/26/16 0854 12/27/16 1131  LIPASE 32 26  AMYLASE  --  33   Cardiac Enzymes Troponin I  Date Value Ref Range Status  12/25/2016 <0.03 <0.03 ng/mL Final  12/24/2016 <0.03 <0.03 ng/mL Final  12/24/2016 <0.03 <0.03 ng/mL Final   ____________  Dg Chest 2 View  Result Date: 12/24/2016 CLINICAL DATA:  Bilateral chest pain with shortness of breath which began 3 days ago. Symptoms were intermittent until today. History of coronary artery disease and previous MI, asthma, ovarian malignancy, diabetes EXAM: CHEST  2 VIEW COMPARISON:  PA and lateral chest x-ray of January 18, 2016 FINDINGS: The lungs are well-expanded. There is no focal infiltrate. The interstitial markings are coarse though stable. The heart and pulmonary vascularity are normal. There is calcification in the wall of the aortic arch. There is no pleural effusion. The bony thorax exhibits no acute abnormality. IMPRESSION: Mild interstitial prominence is consistent with known reactive airway disease and the patient's smoking history. There is no acute pneumonia nor CHF. Thoracic aortic atherosclerosis. Electronically Signed   By: David  [name] M.D.   On: 12/24/2016 16:20   US Abdomen Complete  Result Date: 12/28/2016 CLINICAL DATA:  Abdominal pain. EXAM: ABDOMEN ULTRASOUND COMPLETE COMPARISON:  Abdominal CT 07/26/2015 FINDINGS: Gallbladder: Surgically absent. Common bile duct: Diameter: 7 mm, normal for postcholecystectomy. Liver: No focal lesion identified. Liver parenchyma is heterogeneous and mildly increased compared  to right kidney. Portal vein is patent on color Doppler imaging with normal direction of blood flow towards the liver. IVC: No abnormality visualized. Pancreas: Visualized portion unremarkable. Spleen: Size and appearance within normal limits. Right Kidney: Length: 11.8 cm. Echogenicity within normal limits. No mass or hydronephrosis visualized. Left Kidney: Length: 12.4 cm. Echogenicity within normal limits. No mass or hydronephrosis visualized. Abdominal aorta: No aneurysm visualized. Mid and distal aorta are obscured by bowel gas. Other findings: None.  No ascites. IMPRESSION: 1. Postcholecystectomy.  No biliary dilatation. 2. Heterogeneous increased hepatic parenchymal echotexture suggesting steatosis. 3. Otherwise unremarkable abdominal ultrasound. Electronically Signed   By: Jeb Levering M.D.   On: 12/28/2016 03:27   Nm Myocar Multi W/spect W/wall Motion / Ef  Result Date: 12/26/2016  Defect 1: There is a medium defect of moderate severity present in the basal inferoseptal, basal inferior, mid inferoseptal, mid inferior, apical septal and apical  inferior location.  Findings consistent with ischemia, although soft tissue attenuation artifact is also contributing to this defect.  This is an intermediate risk study.  Nuclear stress EF: 80%.  T wave inversions in III and aVF at baseline, with nonspecific ST segment depressions in II, III, and aVF with stress.    Disposition   Pt is being discharged home today in good condition.  Follow-up Plans & Appointments    Follow-up Information    Lorretta Harp, MD Follow up.   Specialties:  Cardiology, Radiology Why:  The office will call. Contact information: [address] [place] [address] [phone]        [name], MD.   Specialty:  Internal Medicine Contact information: [place] [address] [phone]          Discharge Instructions    Diet - low sodium heart healthy     Complete by:  As directed    Increase activity slowly    Complete by:  As directed       Discharge Medications   Current Discharge Medication List    START taking these medications   Details  amLODipine (NORVASC) 5 MG tablet Take 1 tablet (5 mg total) by mouth daily. Qty: 30 tablet, Refills: 11      CONTINUE these medications which have NOT CHANGED   Details  albuterol (PROAIR HFA) 108 (90 Base) MCG/ACT inhaler 1-2 inhalations every 4-6 hours as needed for cough or wheeze. Qty: 1 Inhaler, Refills: 1   Associated Diagnoses: History of asthma    ALPRAZolam (XANAX) 0.5 MG tablet Take 0.25 mg by mouth daily as needed for anxiety or sleep.     aspirin EC 81 MG tablet Take 81 mg by mouth daily.    Azelastine HCl 0.15 % SOLN Place 2 sprays into both nostrils 2 (two) times daily. Qty: 30 mL, Refills: 5   Associated Diagnoses: Other allergic rhinitis    Calcium Carb-Cholecalciferol (CALCIUM 600 + D PO) Take 1 tablet by mouth daily.    citalopram (CELEXA) 10 MG tablet Take 10 mg by mouth daily.    clopidogrel (PLAVIX) 75 MG tablet Take 75 mg by mouth at bedtime.     cyclobenzaprine (FLEXERIL) 10 MG tablet Take 10 mg by mouth 3 (three) times daily as needed for muscle spasms.    dicyclomine (BENTYL) 20 MG tablet Take 1 tablet (20 mg total) by mouth 3 (three) times daily as needed for spasms.    EPINEPHrine 0.3 mg/0.3 mL IJ SOAJ injection Inject 0.3 mLs (0.3 mg total) into the muscle once. Qty: 1 Device, Refills: 1   Associated Diagnoses: Allergy with anaphylaxis due to food, initial encounter    Fenofibric Acid 105 MG TABS Take 1 tablet by mouth daily.    insulin aspart (NOVOLOG) 100 UNIT/ML injection Inject 5-12 Units into the skin 2 (two) times daily before a meal. Per sliding scale    Insulin Glargine (TOUJEO SOLOSTAR) 300 UNIT/ML SOPN Inject 37 Units into the skin every morning.     insulin lispro (HUMALOG) 100 UNIT/ML injection Inject 5-12 Units into the skin 2 (two)  times daily before a meal. Per sliding scale    loratadine (CLARITIN) 10 MG tablet Take 10 mg by mouth daily as needed for allergies.    mupirocin cream (BACTROBAN) 2 % Apply 1 application topically 2 (two) times daily as needed (wound care (boils)).     nitroGLYCERIN (NITROSTAT) 0.4 MG SL tablet Place 1 tablet (  0.4 mg total) under the tongue every 5 (five) minutes as needed for chest pain. Maximum 3 doses Qty: 25 tablet, Refills: 12    pantoprazole (PROTONIX) 40 MG tablet TAKE 1 TABLET (40 MG TOTAL) BY MOUTH DAILY. Qty: 30 tablet, Refills: 0    propranolol ER (INDERAL LA) 60 MG 24 hr capsule Take 60 mg by mouth every evening.    rosuvastatin (CRESTOR) 40 MG tablet Take 40 mg by mouth daily.    SUMAtriptan (IMITREX) 50 MG tablet Take 50 mg by mouth every 2 (two) hours as needed for migraine.    temazepam (RESTORIL) 30 MG capsule Take 30 mg by mouth at bedtime.    TRULICITY 4.74 QV/9.5GL SOPN Inject 0.75 mg into the skin every Monday. On Fridays    nitroGLYCERIN (NITRODUR - DOSED IN MG/24 HR) 0.4 mg/hr Place 0.4 mg onto the skin as needed (chest pain). Reported on 07/10/2015      STOP taking these medications     ibuprofen (ADVIL,MOTRIN) 800 MG tablet      olmesartan-hydrochlorothiazide (BENICAR HCT) 20-12.5 MG tablet      KLOR-CON 10 10 MEQ tablet           Outstanding Labs/Studies   none  Duration of Discharge Encounter   Greater than 30 minutes including physician time.  Jonetta Speak NP 12/28/2016, 12:53 PM

## 2016-12-28 NOTE — Progress Notes (Signed)
Md on call patient had 10/10 headache and she stated that Tylenol was not helping. New orders were received. Will continue to monitor. Arthor Captain LPN

## 2016-12-28 NOTE — Care Management Note (Signed)
Case Management Note  Patient Details  Name: [name] MRN: [id number] Date of Birth: 07-25-1955  Subjective/Objective:                 Patient with order to DC to home today. Chart reviewed. No Home Health or Equipment needs, no unacknowledged Case Management consults or medication needs identified at the time of this note. Plan for DC to home. If needs arise today prior to discharge, please call Carles Collet RN CM at [phone].    Action/Plan:   Expected Discharge Date:  12/28/16               Expected Discharge Plan:  Home/Self Care  In-House Referral:  NA  Discharge planning Services  CM Consult  Post Acute Care Choice:  NA Choice offered to:  NA  DME Arranged:    DME Agency:     HH Arranged:    HH Agency:     Status of Service:  Completed, signed off  If discussed at [employer] of Stay Meetings, dates discussed:    Additional Comments:  Carles Collet, RN 12/28/2016, 12:29 PM

## 2016-12-30 ENCOUNTER — Telehealth: Payer: Self-pay | Admitting: Cardiovascular Disease

## 2016-12-30 NOTE — Telephone Encounter (Signed)
Left a message to call back (TOC call)

## 2016-12-30 NOTE — Telephone Encounter (Signed)
Patient contacted regarding discharge from [facility] on 12/28/2016.  Patient understands to follow up with provider [name], PA on 9/4 at 9 am at [place], [address]. Patient understands discharge instructions? yes Patient understands medications and regiment? yes Patient understands to bring all medications to this visit? yes

## 2016-12-30 NOTE — Telephone Encounter (Signed)
Patient is scheduled for TCM 01-07-17 @ 9:00 am with [name]

## 2017-01-03 DIAGNOSIS — I10 Essential (primary) hypertension: Secondary | ICD-10-CM | POA: Diagnosis not present

## 2017-01-03 DIAGNOSIS — E782 Mixed hyperlipidemia: Secondary | ICD-10-CM | POA: Diagnosis not present

## 2017-01-03 DIAGNOSIS — E1140 Type 2 diabetes mellitus with diabetic neuropathy, unspecified: Secondary | ICD-10-CM | POA: Diagnosis not present

## 2017-01-03 DIAGNOSIS — I739 Peripheral vascular disease, unspecified: Secondary | ICD-10-CM | POA: Diagnosis not present

## 2017-01-03 DIAGNOSIS — D519 Vitamin B12 deficiency anemia, unspecified: Secondary | ICD-10-CM | POA: Diagnosis not present

## 2017-01-05 ENCOUNTER — Other Ambulatory Visit: Payer: Self-pay | Admitting: Cardiology

## 2017-01-07 ENCOUNTER — Ambulatory Visit (INDEPENDENT_AMBULATORY_CARE_PROVIDER_SITE_OTHER): Payer: Medicare Other | Admitting: Physician Assistant

## 2017-01-07 ENCOUNTER — Encounter: Payer: Self-pay | Admitting: Physician Assistant

## 2017-01-07 VITALS — BP 160/90 | HR 97 | Ht 64.0 in | Wt 145.6 lb

## 2017-01-07 DIAGNOSIS — R079 Chest pain, unspecified: Secondary | ICD-10-CM | POA: Diagnosis not present

## 2017-01-07 DIAGNOSIS — I10 Essential (primary) hypertension: Secondary | ICD-10-CM | POA: Diagnosis not present

## 2017-01-07 DIAGNOSIS — I739 Peripheral vascular disease, unspecified: Secondary | ICD-10-CM

## 2017-01-07 MED ORDER — NITROGLYCERIN 0.4 MG SL SUBL: 0.4000 mg | SUBLINGUAL_TABLET | SUBLINGUAL | 1 refills | Status: DC | PRN

## 2017-01-07 MED ORDER — CLOPIDOGREL BISULFATE 75 MG PO TABS: 75.0000 mg | ORAL_TABLET | Freq: Every day | ORAL | 5 refills | Status: DC

## 2017-01-07 MED ORDER — ISOSORBIDE MONONITRATE ER 30 MG PO TB24: 30.0000 mg | ORAL_TABLET | Freq: Every day | ORAL | 5 refills | Status: DC

## 2017-01-07 MED ORDER — PROPRANOLOL HCL ER 60 MG PO CP24: 60.0000 mg | ORAL_CAPSULE | Freq: Every evening | ORAL | 5 refills | Status: DC

## 2017-01-07 NOTE — Telephone Encounter (Signed)
Rx(s) sent to pharmacy electronically.  

## 2017-01-07 NOTE — Patient Instructions (Signed)
Medication Instructions:  RESTART PROPRANOLOL-IF PALPITATIONS CONTINUE AFTER STARTING PLEASE CALL AND LET RHONDA KNOW. If you need a refill on your cardiac medications before your next appointment, please call your pharmacy.  Follow-Up: Your physician wants you to follow-up in: 6 [place] should receive a reminder letter in the mail two months in advance. If you do not receive a letter, please call our office January 2019  to schedule the MARCH 2019 follow-up appointment.  Special Instructions: PLEASE MAKE SURE TO FOLLOW UP WITH DR Carlean Purl IN GI AT [phone]  PLEASE MAKE SURE YOUR PCP DR Everlene Other YOUR RECENT LABWORK TO [phone]   Thank you for choosing [facility] at [facility]!!

## 2017-01-07 NOTE — Progress Notes (Signed)
Cardiology Office Note   Date:  01/07/2017   ID:  [name], DOB 1955-10-16, MRN [id number]  PCP:  [name], MD  Cardiologist:  Dr. Gwenlyn Found 02/20/2016 [name], PA-C   Chief Complaint  Patient presents with  . Follow-up    hosp  . Shortness of Breath    History of Present Illness: [name] is a 61 y.o. 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 Transferred from [place] after abnormal Myoview, cath with mild nonobstructive disease and medical therapy recommended  [name] presents for cardiology follow up.  1. When she walks, she feels her heart flutter or race, she will stop and her HR will improve. She gets SOB with this. She has been off her propanolol, thought she was supposed to stop it.   2. Dry heaves: her appetite is very small, she does not eat very often. She is keeping down the food she eats. She is keeping down her medication. She will get dry heaves from smelling food at times. She feels the dry heaves are happening more often in the evenings. She has 2-3 loose stools a day. She has not lost any weight since June 2018.  3. Her L leg bothers her at times, there is some numbness left over from the surgery and some pain.   4. She has DOE, this is chronic but has not changed recently. She denies LE edema, orthopnea or PND.   Past Medical History:  Diagnosis Date  . 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 Anemia    hx  . Angioedema 07/10/2015  . Anxiety   . Arthritis    "neck, left hand" (09/14/2012)  . Asthma   . Cancer ([place]) 1985   ovarian, no treatment except surgery  . Complication of anesthesia     OCCASIONAL TROUBLE TURNING NECK TO RIGHT  . Critical lower limb ischemia    10/2014 s/p L SFA stenting  . Diverticulosis of colon with hemorrhage April 2013  . GERD (gastroesophageal reflux disease)   . H/O hiatal hernia   . Heart attack [facility])    2003 mild MI, March 2013 mild MI  . Hidradenitis    groin  . History of blood transfusion 1985 AND 2013  . Hyperlipidemia   . Hypertension   . Irritable bowel syndrome   . Left-sided weakness    since stroke, left eye trouble seeing  . Migraines   . Mild CAD    a. Cath 09/2010: mild luminal irregularities of LAD, 30% prox RCA and 20-30% mRCA, EF 65%.  . Neuropathy   . Obesity   . PAD (peripheral artery disease) ([place])    a. critical limb ischemia s/p PTA/stenting of L SFA 10/2014. c. occ prior SFA stent by angio 01/2016, for possible PV bypass.  . Pneumonia    baby  . Recurrent upper respiratory infection (URI)   . S/P arterial stent-mid Lt SFA 11/03/14 11/04/2014  . Schatzki's ring   . Sinus problem   . Stomach ulcer 1972   non-bleeding  . Stroke ([place]) 07-2007, 07-2008, 07-2009   total 3 strokes; mild left sided weakness and left eye "jumps".  . Type  II diabetes mellitus ([place])   . Vitamin B 12 deficiency 07-15-2013    Past Surgical History:  Procedure Laterality Date  . ABDOMINAL HYSTERECTOMY  1985  . ADENOIDECTOMY    . ANTERIOR CERVICAL DECOMP/DISCECTOMY FUSION  2002  . ANTERIOR CERVICAL DECOMP/DISCECTOMY FUSION N/A 04/08/2014   Procedure: Cervical Six-Seven ANTERIOR CERVICAL DECOMPRESSION/DISCECTOMY FUSION Plating and Bonegraft  2 LEVELS;  Surgeon: Ashok Pall, MD;  Location: [place] NEURO ORS;  Service: Neurosurgery;  Laterality: N/A;  Cervical Six-Seven ANTERIOR CERVICAL DECOMPRESSION/DISCECTOMY FUSION Plating and Bonegraft  2 LEVELS  . APPENDECTOMY  1985  . AXILLARY HIDRADENITIS EXCISION  1990-2008   bilateral  . BACK SURGERY    . BREAST BIOPSY Right 2007  . BREAST CYST EXCISION Right 2008  . BREAST REDUCTION SURGERY    . CARDIAC  CATHETERIZATION  2004   mild disease  . CATARACT EXTRACTION W/PHACO Left 05/16/2015   Procedure: CATARACT EXTRACTION PHACO AND INTRAOCULAR LENS PLACEMENT (IOC);  Surgeon: Rutherford Guys, MD;  Location: [facility];  Service: Ophthalmology;  Laterality: Left;  CDE: 4.24  . CATARACT EXTRACTION W/PHACO Right 05/30/2015   Procedure: CATARACT EXTRACTION RIGHT EYE PHACO AND INTRAOCULAR LENS PLACEMENT ;  Surgeon: Rutherford Guys, MD;  Location: [facility];  Service: Ophthalmology;  Laterality: Right;  CDE:4.08  . CHOLECYSTECTOMY  1990's  . COLONOSCOPY  08/12/2011   Procedure: COLONOSCOPY;  Surgeon: Ladene Artist, MD,FACG;  Location[facility] ENDOSCOPY;  Service: Endoscopy;  Laterality: N/A;  . COLONOSCOPY  06/19/2006  . cyst thigh Right   . ESOPHAGOGASTRODUODENOSCOPY  08/12/2011   Procedure: ESOPHAGOGASTRODUODENOSCOPY (EGD);  Surgeon: Ladene Artist, MD,FACG;  Location: [facility] ENDOSCOPY;  Service: Endoscopy;  Laterality: N/A;  . ESOPHAGOGASTRODUODENOSCOPY  06/04/2005  . FEMORAL-POPLITEAL BYPASS GRAFT Left 06/19/2016   Procedure: Left Leg BYPASS GRAFT FEMORAL-POPLITEAL ARTERY;  Surgeon: Serafina Mitchell, MD;  Location: [place];  Service: Vascular;  Laterality: Left;  . GIVENS CAPSULE STUDY  08/13/2011   Procedure: GIVENS CAPSULE STUDY;  Surgeon: Ladene Artist, MD,FACG;  Location: [facility] ENDOSCOPY;  Service: Endoscopy;  Laterality: N/A;  . HAMMER TOE SURGERY Bilateral ~ 2000  . HIATAL HERNIA REPAIR    . HYDRADENITIS EXCISION  01/2011; 03/2012   'groin and abdomen; 03/2012" (09/14/2012)  . HYDRADENITIS EXCISION  04/01/2012   Procedure: EXCISION HYDRADENITIS GROIN;  Surgeon: Pedro Earls, MD;  Location: [facility] ORS;  Service: General;  Laterality: Bilateral;  Excision of Hydradenitis of Perineum  . HYDRADENITIS EXCISION N/A 09/17/2013   Procedure: EXCISION PERINEAL HIDRADENITIS ;  Surgeon: Pedro Earls, MD;  Location: [facility];  Service: General;  Laterality: N/A;  also in the pubis area  . LEFT HEART CATH AND CORONARY ANGIOGRAPHY N/A  12/26/2016   Procedure: LEFT HEART CATH AND CORONARY ANGIOGRAPHY;  Surgeon: [name], Peter M, MD;  Location: [place] CV LAB;  Service: Cardiovascular;  Laterality: N/A;  . MASS EXCISION Right 09/17/2013   Procedure: EXCISION MASS;  Surgeon: Pedro Earls, MD;  Location: [facility] ORS;  Service: General;  Laterality: Right;  . NISSEN FUNDOPLICATION  4235  . PERIPHERAL VASCULAR CATHETERIZATION N/A 11/03/2014   Procedure: Lower Extremity Angiography;  Surgeon: Lorretta Harp, MD;  Location: [place] CV LAB;  Service: Cardiovascular;  Laterality: N/A;  . PERIPHERAL VASCULAR CATHETERIZATION N/A 01/29/2016   Procedure: Lower Extremity Angiography;  Surgeon: Lorretta Harp, MD;  Location: [place] CV LAB;  Service: Cardiovascular;  Laterality: N/A;  . POSTERIOR LUMBAR FUSION  2008 X 2  . REDUCTION MAMMAPLASTY  1996?  . TONSILLECTOMY  [place] AND 2000  . UVULOPALATOPHARYNGOPLASTY, TONSILLECTOMY AND SEPTOPLASTY  2000's    Medication Sig  . albuterol (PROAIR HFA) 108 (90 Base) MCG/ACT inhaler 1-2 inhalations every 4-6 hours as needed for cough or wheeze. (Patient taking differently: Inhale 1-2 puffs into the lungs every 4 (four) hours as needed for shortness of breath. )  . ALPRAZolam (XANAX) 0.5 MG tablet Take 0.25 mg by mouth daily as needed for anxiety or sleep.   Marland Kitchen amLODipine (NORVASC) 5 MG tablet Take 1 tablet (5 mg total) by mouth daily.  Marland Kitchen aspirin EC 81 MG tablet Take 81 mg by mouth daily.  . Azelastine HCl 0.15 % SOLN Place 2 sprays into both nostrils 2 (two) times daily. (Patient taking differently: Place 2 sprays into both nostrils 2 (two) times daily as needed (allergies). )  . Calcium Carb-Cholecalciferol (CALCIUM 600 + D PO) Take 1 tablet by mouth daily.  . citalopram (CELEXA) 10 MG tablet Take 10 mg by mouth daily.  . clopidogrel (PLAVIX) 75 MG tablet Take 75 mg by mouth daily with supper.   . cyclobenzaprine (FLEXERIL) 10 MG tablet Take 10 mg by mouth 3 (three) times  daily as needed for muscle spasms.  Marland Kitchen dicyclomine (BENTYL) 20 MG tablet Take 1 tablet (20 mg total) by mouth 3 (three) times daily as needed for spasms.  Marland Kitchen EPINEPHrine 0.3 mg/0.3 mL IJ SOAJ injection Inject 0.3 mLs (0.3 mg total) into the muscle once.  . Fenofibric Acid 105 MG TABS Take 1 tablet by mouth daily.  . insulin aspart (NOVOLOG) 100 UNIT/ML injection Inject 5-12 Units into the skin 2 (two) times daily before a meal. Per sliding scale  . Insulin Glargine (TOUJEO SOLOSTAR) 300 UNIT/ML SOPN Inject 37 Units into the skin every morning.   . insulin lispro (HUMALOG) 100 UNIT/ML injection Inject 5-12 Units into the skin 2 (two) times daily before a meal. Per sliding scale  . loratadine (CLARITIN) 10 MG tablet Take 10 mg by mouth daily as needed for allergies.  . mupirocin cream (BACTROBAN) 2 % Apply 1 application topically 2 (two) times daily as needed (wound care (boils)).   . nitroGLYCERIN (NITROSTAT) 0.4 MG SL tablet Place 1 tablet (0.4 mg total) under the tongue every 5 (five) minutes as needed for chest pain. Maximum 3 doses  . pantoprazole (PROTONIX) 40 MG tablet TAKE 1 TABLET (40 MG TOTAL) BY MOUTH DAILY.  Marland Kitchen propranolol ER (INDERAL LA) 60 MG 24 hr capsule Take 60 mg by mouth every evening.  . rosuvastatin (CRESTOR) 40 MG tablet Take 40 mg by mouth daily.  . SUMAtriptan (IMITREX) 50 MG tablet Take 50 mg by mouth every 2 (two) hours as needed for migraine.  . temazepam (RESTORIL) 30 MG capsule Take 30 mg by mouth at bedtime.  . TRULICITY 9.56 OZ/3.0QM SOPN Inject 0.75 mg into the skin every Monday. On Fridays   No current facility-administered medications for this visit.     Allergies:   Sulfa antibiotics; Codeine; Fish allergy; Iodine; Metformin and related; Adhesive [tape]; and Shellfish allergy    Social History:  The patient  reports that she quit smoking about a year ago. Her smoking use included Cigarettes. She has a 13.53 pack-year smoking history. She has never used smokeless  tobacco. She reports that she does not drink alcohol or use drugs.   Family History:  The patient's family history includes Allergic rhinitis in her sister; Aneurysm in her sister; Breast cancer in her mother; Diabetes in her father  and sister; Emphysema in her unknown relative; Heart disease in her father, mother, and sister; Hyperlipidemia in her sister; Hypertension in her mother, sister, and sister; Stroke in her father.    ROS:  Please see the history of present illness. All other systems are reviewed and negative.    PHYSICAL EXAM: VS:  BP (!) 160/90   Pulse 97   Ht 5\' 4"  (1.626 m)   Wt 145 lb 9.6 oz (66 kg)   BMI 24.99 kg/m  , BMI Body mass index is 24.99 kg/m. GEN: Well nourished, well developed, female in no acute distress  HEENT: normal for age  Neck: no JVD, no carotid bruit, no masses Cardiac: RRR; soft murmur, no rubs, or gallops Respiratory: decreased BS bases w/ few scattered rales bilaterally, normal work of breathing GI: soft, nontender, nondistended, + BS MS: no deformity or atrophy; no edema; distal pulses are 2+ in upper extremities, decreased but palpable in both lower extremities   Skin: warm and dry, no rash Neuro:  Strength and sensation are intact Psych: euthymic mood, full affect   EKG:  EKG is ordered today. The ekg ordered today demonstrates sinus rhythm, heart rate 97, no acute ischemic changes  CARDIAC CATH: CARDIAC CATH: 12/26/2016  Prox LAD to Mid LAD lesion, 20 %stenosed.  Prox RCA to Mid RCA lesion, 25 %stenosed.  LV end diastolic pressure is mildly elevated. 1. Mild nonobstructive CAD 2. Mildly elevated LVEDP Plan: medical therapy.   Recent Labs: 01/18/2016: TSH 0.52 12/24/2016: Hemoglobin 13.2; Platelets 218 12/26/2016: ALT 42 12/27/2016: BUN 18; Creatinine, Ser 1.37; Potassium 3.6; Sodium 138    Lipid Panel    Component Value Date/Time   CHOL 103 07/06/2011 0500   TRIG 176 (H) 07/06/2011 0500   HDL 32 (L) 07/06/2011 0500    CHOLHDL 3.2 07/06/2011 0500   VLDL 35 07/06/2011 0500   LDLCALC 36 07/06/2011 0500     Wt Readings from Last 3 Encounters:  01/07/17 145 lb 9.6 oz (66 kg)  12/28/16 144 lb 11.2 oz (65.6 kg)  10/28/16 145 lb (65.8 kg)     Other studies Reviewed: Additional studies/ records that were reviewed today include: Office notes, hospital records and testing.  ASSESSMENT AND PLAN:  1.  Left femoropopliteal bypass: She is recovering from the surgery. Her ABIs are much improved. However, she is still having problems increasing her activity. She is urged to continue to increase gradually and follow-up with Dr. Trula Slade.  2. Chest pain: Recent catheterization showed nonobstructive disease. Medical therapy was recommended. Her EF was 59% at [facility] in March 2017. Continue baby aspirin and restart propanolol.  3. CVA: She has a CVA history and her Plavix was started for that.  4. Hypertension: She is to restart her propanolol. If her blood pressure is not controlled after that, increase amlodipine.  **Patient is applying for a program through social services that helps with her medications. She requests a letter regarding her cardiac conditions. A letter was written stating that because of her history of PAD, CVA, hypertension and other issues, she is having problems for all of her medications.  Current medicines are reviewed at length with the patient today.  The patient has concerns regarding medicines. Concerns were addressed  The following changes have been made:  Restart propanolol  Labs/ tests ordered today include:   Orders Placed This Encounter  Procedures  . EKG 12-Lead    Disposition:   FU with Dr. Gwenlyn Found  Signed, Sakiya Stepka, Suanne Marker, PA-C  01/07/2017 4:20  PM    [facility] Phone: [phone]; Fax: [phone]  This note was written with the assistance of speech recognition software. Please excuse any transcriptional errors.

## 2017-02-17 ENCOUNTER — Encounter (HOSPITAL_COMMUNITY): Payer: Medicare Other

## 2017-02-17 ENCOUNTER — Other Ambulatory Visit (HOSPITAL_COMMUNITY): Payer: Medicare Other

## 2017-02-17 ENCOUNTER — Ambulatory Visit: Payer: Medicare Other | Admitting: Surgery

## 2017-03-03 DIAGNOSIS — M25551 Pain in right hip: Secondary | ICD-10-CM | POA: Diagnosis not present

## 2017-03-17 DIAGNOSIS — E782 Mixed hyperlipidemia: Secondary | ICD-10-CM | POA: Diagnosis not present

## 2017-03-17 DIAGNOSIS — E1140 Type 2 diabetes mellitus with diabetic neuropathy, unspecified: Secondary | ICD-10-CM | POA: Diagnosis not present

## 2017-03-17 DIAGNOSIS — I10 Essential (primary) hypertension: Secondary | ICD-10-CM | POA: Diagnosis not present

## 2017-03-17 DIAGNOSIS — D519 Vitamin B12 deficiency anemia, unspecified: Secondary | ICD-10-CM | POA: Diagnosis not present

## 2017-03-19 DIAGNOSIS — E1140 Type 2 diabetes mellitus with diabetic neuropathy, unspecified: Secondary | ICD-10-CM | POA: Diagnosis not present

## 2017-03-19 DIAGNOSIS — I739 Peripheral vascular disease, unspecified: Secondary | ICD-10-CM | POA: Diagnosis not present

## 2017-04-02 ENCOUNTER — Encounter: Payer: Self-pay | Admitting: Surgery

## 2017-04-02 ENCOUNTER — Ambulatory Visit (HOSPITAL_COMMUNITY)
Admission: RE | Admit: 2017-04-02 | Discharge: 2017-04-02 | Disposition: A | Discharge status: Home / Self Care | Payer: Medicare Other | Source: Ambulatory Visit | Attending: Surgery | Admitting: Surgery

## 2017-04-02 ENCOUNTER — Ambulatory Visit (INDEPENDENT_AMBULATORY_CARE_PROVIDER_SITE_OTHER)
Admission: RE | Admit: 2017-04-02 | Discharge: 2017-04-02 | Disposition: A | Discharge status: Home / Self Care | Payer: Medicare Other | Source: Ambulatory Visit | Attending: Surgery | Admitting: Surgery

## 2017-04-02 ENCOUNTER — Other Ambulatory Visit: Payer: Self-pay

## 2017-04-02 ENCOUNTER — Ambulatory Visit (INDEPENDENT_AMBULATORY_CARE_PROVIDER_SITE_OTHER): Payer: Medicare Other | Admitting: Surgery

## 2017-04-02 VITALS — BP 136/89 | HR 91 | Temp 97.3°F | Resp 14 | Ht 64.0 in | Wt 144.0 lb

## 2017-04-02 DIAGNOSIS — Z95820 Peripheral vascular angioplasty status with implants and grafts: Secondary | ICD-10-CM | POA: Insufficient documentation

## 2017-04-02 DIAGNOSIS — Z87891 Personal history of nicotine dependence: Secondary | ICD-10-CM | POA: Insufficient documentation

## 2017-04-02 DIAGNOSIS — E1151 Type 2 diabetes mellitus with diabetic peripheral angiopathy without gangrene: Secondary | ICD-10-CM | POA: Diagnosis not present

## 2017-04-02 DIAGNOSIS — I739 Peripheral vascular disease, unspecified: Secondary | ICD-10-CM | POA: Diagnosis not present

## 2017-04-02 DIAGNOSIS — E785 Hyperlipidemia, unspecified: Secondary | ICD-10-CM | POA: Insufficient documentation

## 2017-04-02 DIAGNOSIS — I10 Essential (primary) hypertension: Secondary | ICD-10-CM | POA: Insufficient documentation

## 2017-04-02 NOTE — Progress Notes (Signed)
[facility] [place]  Patient name: [name] MRN: [id number] DOB: December 25, 1955 Sex: female   REASON FOR VISIT:    Follow-up  HISOTRY OF PRESENT ILLNESS:    [name] is a 61 y.o. female who returns today for follow-up.  She is status post left femoral to popliteal artery bypass graft with PTFE on 06/09/2016 for lifestyle limiting claudication.Laser complaint he would have mentioned it to me and noticed as she is is next she had previously undergone atherectomy and angioplasty by Dr. Gwenlyn Found.  She still has nerve pain in her above knee incision, nut her claudication has resolved.  PAST MEDICAL HISTORY:   Past Medical History:  Diagnosis Date  . 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 Anemia    hx  . Angioedema 07/10/2015  . Anxiety   . Arthritis    "neck, left hand" (09/14/2012)  . Asthma   . Cancer ([place]) 1985   ovarian, no treatment except surgery  . Complication of anesthesia    OCCASIONAL TROUBLE TURNING NECK TO RIGHT  . Critical lower limb ischemia    10/2014 s/p L SFA stenting  . Diverticulosis of colon with hemorrhage April 2013  . GERD (gastroesophageal reflux disease)   . H/O hiatal hernia   . Heart attack [facility])    2003 mild MI, March 2013 mild MI  . Hidradenitis    groin  . History of blood transfusion 1985 AND 2013  . Hyperlipidemia   . Hypertension   . Irritable bowel syndrome   . Left-sided weakness    since stroke, left eye trouble seeing  . Migraines   . Mild CAD    a. Cath 09/2010: mild luminal irregularities of LAD, 30% prox RCA and 20-30% mRCA, EF 65%.  . Neuropathy   . Obesity   . PAD (peripheral artery disease) ([place])    a. critical limb ischemia s/p PTA/stenting of L SFA 10/2014. c. occ prior SFA stent by angio 01/2016, for possible PV bypass.  . Pneumonia    baby  . Recurrent upper respiratory infection (URI)   . S/P arterial stent-mid Lt SFA 11/03/14 11/04/2014  . Schatzki's ring   . Sinus  problem   . Stomach ulcer 1972   non-bleeding  . Stroke ([place]) 07-2007, 07-2008, 07-2009   total 3 strokes; mild left sided weakness and left eye "jumps".  . Type II diabetes mellitus ([place])   . Vitamin B 12 deficiency 07-15-2013     FAMILY HISTORY:   Family History  Problem Relation Age of Onset  . Breast cancer Mother   . Heart disease Mother   . Hypertension Mother   . Diabetes Father   . Heart disease Father   . Stroke Father   . Heart disease Sister   . Hypertension Sister   . Hypertension Sister   . Hyperlipidemia Sister   . Diabetes Sister   . Allergic rhinitis Sister   . Emphysema Unknown        great uncle  . Aneurysm Sister        brain  . Angioedema Neg Hx   . Asthma Neg Hx   . Eczema Neg Hx   . Immunodeficiency Neg Hx   . Urticaria Neg Hx     SOCIAL HISTORY:   Social History   Tobacco Use  . Smoking status: Former Smoker    Packs/day: 0.33    Years: 41.00    Pack years: 13.53    Types: Cigarettes  Last attempt to quit: 01/15/2016    Years since quitting: 1.2  . Smokeless tobacco: Never Used  . Tobacco comment: Getting ready to start nicotine patches RX by Dr. Gwenlyn Found per pt.  Substance Use Topics  . Alcohol use: No    Alcohol/week: 0.0 oz     ALLERGIES:   Allergies  Allergen Reactions  . Sulfa Antibiotics Shortness Of Breath and Palpitations  . 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 Fish Allergy Hives and Swelling    Tongue swelling  . Iodine Swelling  . Metformin And Related Diarrhea  . Adhesive [Tape] Rash    Paper tape is ok  . Shellfish Allergy Swelling and Rash    Tongue swelling     CURRENT MEDICATIONS:   Current Outpatient Medications  Medication Sig Dispense Refill  . albuterol (PROAIR HFA) 108 (90 Base) MCG/ACT inhaler 1-2 inhalations every 4-6 hours as needed for cough or wheeze. (Patient taking differently: Inhale 1-2 puffs into the lungs every 4 (four) hours as needed for  shortness of breath. ) 1 Inhaler 1  . ALPRAZolam (XANAX) 0.5 MG tablet Take 0.25 mg by mouth daily as needed for anxiety or sleep.     Marland Kitchen amLODipine (NORVASC) 5 MG tablet Take 1 tablet (5 mg total) by mouth daily. 30 tablet 11  . aspirin EC 81 MG tablet Take 81 mg by mouth daily.    . Azelastine HCl 0.15 % SOLN Place 2 sprays into both nostrils 2 (two) times daily. (Patient taking differently: Place 2 sprays into both nostrils 2 (two) times daily as needed (allergies). ) 30 mL 5  . Calcium Carb-Cholecalciferol (CALCIUM 600 + D PO) Take 1 tablet by mouth daily.    . citalopram (CELEXA) 10 MG tablet Take 10 mg by mouth daily.    . clopidogrel (PLAVIX) 75 MG tablet Take 1 tablet (75 mg total) by mouth daily with supper. 30 tablet 5  . cyclobenzaprine (FLEXERIL) 10 MG tablet Take 10 mg by mouth 3 (three) times daily as needed for muscle spasms.    Marland Kitchen dicyclomine (BENTYL) 20 MG tablet Take 1 tablet (20 mg total) by mouth 3 (three) times daily as needed for spasms.    Marland Kitchen EPINEPHrine 0.3 mg/0.3 mL IJ SOAJ injection Inject 0.3 mLs (0.3 mg total) into the muscle once. 1 Device 1  . Fenofibric Acid 105 MG TABS Take 1 tablet by mouth daily.    . insulin aspart (NOVOLOG) 100 UNIT/ML injection Inject 5-12 Units into the skin 2 (two) times daily before a meal. Per sliding scale    . Insulin Glargine (TOUJEO SOLOSTAR) 300 UNIT/ML SOPN Inject 37 Units into the skin every morning.     . insulin lispro (HUMALOG) 100 UNIT/ML injection Inject 5-12 Units into the skin 2 (two) times daily before a meal. Per sliding scale    . isosorbide mononitrate (IMDUR) 30 MG 24 hr tablet Take 1 tablet (30 mg total) by mouth daily. 30 tablet 5  . loratadine (CLARITIN) 10 MG tablet Take 10 mg by mouth daily as needed for allergies.    . mupirocin cream (BACTROBAN) 2 % Apply 1 application topically 2 (two) times daily as needed (wound care (boils)).     . nitroGLYCERIN (NITROSTAT) 0.4 MG SL tablet Place 1 tablet (0.4 mg total) under the  tongue every 5 (five) minutes as needed for chest pain. Maximum 3 doses 25 tablet 1  . pantoprazole (PROTONIX) 40 MG tablet  TAKE 1 TABLET BY MOUTH EVERY DAY 30 tablet 11  . propranolol ER (INDERAL LA) 60 MG 24 hr capsule Take 1 capsule (60 mg total) by mouth every evening. 30 capsule 5  . rosuvastatin (CRESTOR) 40 MG tablet Take 40 mg by mouth daily.    . SUMAtriptan (IMITREX) 50 MG tablet Take 50 mg by mouth every 2 (two) hours as needed for migraine.    . temazepam (RESTORIL) 30 MG capsule Take 30 mg by mouth at bedtime.    . TRULICITY 5.62 BW/3.8LH SOPN Inject 0.75 mg into the skin every Monday. On Fridays     No current facility-administered medications for this visit.     REVIEW OF SYSTEMS:   [X]  denotes positive finding, [ ]  denotes negative finding Cardiac  Comments:  Chest pain or chest pressure:    Shortness of breath upon exertion:    Short of breath when lying flat:    Irregular heart rhythm:        Vascular    Pain in calf, thigh, or hip brought on by ambulation:    Pain in feet at night that wakes you up from your sleep:     Blood clot in your veins:    Leg swelling:         Pulmonary    Oxygen at home:    Productive cough:     Wheezing:         Neurologic    Sudden weakness in arms or legs:     Sudden numbness in arms or legs:     Sudden onset of difficulty speaking or slurred speech:    Temporary loss of vision in one eye:     Problems with dizziness:         Gastrointestinal    Blood in stool:     Vomited blood:         Genitourinary    Burning when urinating:     Blood in urine:        Psychiatric    Major depression:         Hematologic    Bleeding problems:    Problems with blood clotting too easily:        Skin    Rashes or ulcers:        Constitutional    Fever or chills:      PHYSICAL EXAM:   There were no vitals filed for this visit.  GENERAL: The patient is a well-nourished female, in no acute distress. The vital signs are  documented above. CARDIAC: There is a regular rate and rhythm.  VASCULAR: Non-palpable left pedal pulses PULMONARY: Non-labored respirations MUSCULOSKELETAL: There are no major deformities or cyanosis. NEUROLOGIC: No focal weakness or paresthesias are detected. SKIN: There are no ulcers or rashes noted. PSYCHIATRIC: The patient has a normal affect.  STUDIES:   U/S shows a left ABI of .95 and no stenosis within the graft  MEDICAL ISSUES:   S/P left fem AK pop BPG with PTFE.  She is asx.  Duplex shows BPG to be widely patent.  I will need to monitor the velocities within the graft as they are on the low side.  She will follow up in 6 months with a duplex    Annamarie Major, MD [facility] [phone] Pager [phone]

## 2017-04-02 NOTE — Progress Notes (Signed)
Vitals:   04/02/17 1357  BP: (!) 145/88  Pulse: 93  Resp: 14  Temp: (!) 97.3 F (36.3 C)  TempSrc: Oral  SpO2: 99%  Weight: 144 lb (65.3 kg)  Height: 5\' 4"  (1.626 m)

## 2017-04-03 NOTE — Addendum Note (Signed)
Addended by: Lianne Cure A on: 04/03/2017 02:09 PM   Modules accepted: Orders

## 2017-04-08 ENCOUNTER — Ambulatory Visit (INDEPENDENT_AMBULATORY_CARE_PROVIDER_SITE_OTHER): Payer: Medicare Other

## 2017-04-08 ENCOUNTER — Encounter: Payer: Self-pay | Admitting: Orthopedic Surgery

## 2017-04-08 ENCOUNTER — Ambulatory Visit: Payer: Medicare Other | Admitting: Orthopedic Surgery

## 2017-04-08 VITALS — BP 127/78 | HR 69 | Ht 64.0 in | Wt 143.0 lb

## 2017-04-08 DIAGNOSIS — M25551 Pain in right hip: Secondary | ICD-10-CM

## 2017-04-08 DIAGNOSIS — M7061 Trochanteric bursitis, right hip: Secondary | ICD-10-CM

## 2017-04-08 DIAGNOSIS — Z8639 Personal history of other endocrine, nutritional and metabolic disease: Secondary | ICD-10-CM

## 2017-04-08 DIAGNOSIS — R2241 Localized swelling, mass and lump, right lower limb: Secondary | ICD-10-CM | POA: Diagnosis not present

## 2017-04-08 NOTE — Progress Notes (Signed)
NEW PATIENT OFFICE VISIT    Chief Complaint  Patient presents with  . Hip Pain    right     61 yo female presents for evaluation of 3-year history of right hip pain.  The patient has been evaluated previously by [facility] orthopedics.  No recommendations other than oral calcium supplementation was recommended  She basically has pain over the right greater trochanter which is dull and aches.  She has trouble sleeping on the right side has to sleep at all but has no trouble walking or moving her hip.  The pain is mild to moderate on a pain scale 4-6 out of 10 is constant but worse with pressure on the right greater trochanter.  There is also an area of firm induration which is been present for several years associated with calcification in the soft tissues of the hip but this area is not tender.   The mass was seen on x-rays in 2014 it is now larger.  It measures approximately 5-1/2 x 4-1/2 cm.  There is no bony involvement that I can see on x-ray    Review of Systems  Constitutional: Negative for chills, fever and malaise/fatigue.  Respiratory: Negative for shortness of breath.   Skin: Negative for itching and rash.  Neurological: Negative for tingling, sensory change and focal weakness.     Past Medical History:  Diagnosis Date  . 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 Anemia    hx  . Angioedema 07/10/2015  . Anxiety   . Arthritis    "neck, left hand" (09/14/2012)  . Asthma   . Cancer ([place]) 1985   ovarian, no treatment except surgery  . Complication of anesthesia    OCCASIONAL TROUBLE TURNING NECK TO RIGHT  . Critical lower limb ischemia    10/2014 s/p L SFA stenting  . Diverticulosis of colon with hemorrhage April 2013  . GERD (gastroesophageal reflux disease)   . H/O hiatal hernia   . Heart attack [facility])    2003 mild MI, March 2013 mild MI  . Hidradenitis    groin  . History of blood transfusion 1985 AND 2013  . Hyperlipidemia   . Hypertension   . Irritable bowel syndrome     . Left-sided weakness    since stroke, left eye trouble seeing  . Migraines   . Mild CAD    a. Cath 09/2010: mild luminal irregularities of LAD, 30% prox RCA and 20-30% mRCA, EF 65%.  . Neuropathy   . Obesity   . PAD (peripheral artery disease) ([place])    a. critical limb ischemia s/p PTA/stenting of L SFA 10/2014. c. occ prior SFA stent by angio 01/2016, for possible PV bypass.  . Pneumonia    baby  . Recurrent upper respiratory infection (URI)   . S/P arterial stent-mid Lt SFA 11/03/14 11/04/2014  . Schatzki's ring   . Sinus problem   . Stomach ulcer 1972   non-bleeding  . Stroke ([place]) 07-2007, 07-2008, 07-2009   total 3 strokes; mild left sided weakness and left eye "jumps".  . Type II diabetes mellitus ([place])   . Vitamin B 12 deficiency 07-15-2013    Past Surgical History:  Procedure Laterality Date  . ABDOMINAL HYSTERECTOMY  1985  . ADENOIDECTOMY    . ANTERIOR CERVICAL DECOMP/DISCECTOMY FUSION  2002  . ANTERIOR CERVICAL DECOMP/DISCECTOMY FUSION N/A 04/08/2014   Procedure: Cervical Six-Seven ANTERIOR CERVICAL DECOMPRESSION/DISCECTOMY FUSION Plating and Bonegraft  2 LEVELS;  Surgeon: Ashok Pall, MD;  Location: [place] NEURO ORS;  Service: Neurosurgery;  Laterality: N/A;  Cervical Six-Seven ANTERIOR CERVICAL DECOMPRESSION/DISCECTOMY FUSION Plating and Bonegraft  2 LEVELS  . APPENDECTOMY  1985  . AXILLARY HIDRADENITIS EXCISION  1990-2008   bilateral  . BACK SURGERY    . BREAST BIOPSY Right 2007  . BREAST CYST EXCISION Right 2008  . BREAST REDUCTION SURGERY    . CARDIAC CATHETERIZATION  2004   mild disease  . CATARACT EXTRACTION W/PHACO Left 05/16/2015   Procedure: CATARACT EXTRACTION PHACO AND INTRAOCULAR LENS PLACEMENT (IOC);  Surgeon: Rutherford Guys, MD;  Location: [facility];  Service: Ophthalmology;  Laterality: Left;  CDE: 4.24  . CATARACT EXTRACTION W/PHACO Right 05/30/2015   Procedure: CATARACT EXTRACTION RIGHT EYE PHACO AND INTRAOCULAR LENS PLACEMENT ;  Surgeon: Rutherford Guys, MD;   Location: [facility];  Service: Ophthalmology;  Laterality: Right;  CDE:4.08  . CHOLECYSTECTOMY  1990's  . COLONOSCOPY  08/12/2011   Procedure: COLONOSCOPY;  Surgeon: Ladene Artist, MD,FACG;  Location: [facility] ENDOSCOPY;  Service: Endoscopy;  Laterality: N/A;  . COLONOSCOPY  06/19/2006  . cyst thigh Right   . ESOPHAGOGASTRODUODENOSCOPY  08/12/2011   Procedure: ESOPHAGOGASTRODUODENOSCOPY (EGD);  Surgeon: Ladene Artist, MD,FACG;  Location: [facility] ENDOSCOPY;  Service: Endoscopy;  Laterality: N/A;  . ESOPHAGOGASTRODUODENOSCOPY  06/04/2005  . FEMORAL-POPLITEAL BYPASS GRAFT Left 06/19/2016   Procedure: Left Leg BYPASS GRAFT FEMORAL-POPLITEAL ARTERY;  Surgeon: Serafina Mitchell, MD;  Location: [place];  Service: Vascular;  Laterality: Left;  . GIVENS CAPSULE STUDY  08/13/2011   Procedure: GIVENS CAPSULE STUDY;  Surgeon: Ladene Artist, MD,FACG;  Location: [facility] ENDOSCOPY;  Service: Endoscopy;  Laterality: N/A;  . HAMMER TOE SURGERY Bilateral ~ 2000  . HIATAL HERNIA REPAIR    . HYDRADENITIS EXCISION  01/2011; 03/2012   'groin and abdomen; 03/2012" (09/14/2012)  . HYDRADENITIS EXCISION  04/01/2012   Procedure: EXCISION HYDRADENITIS GROIN;  Surgeon: Pedro Earls, MD;  Location: [facility]S;  Service: General;  Laterality: Bilateral;  Excision of Hydradenitis of Perineum  . HYDRADENITIS EXCISION N/A 09/17/2013   Procedure: EXCISION PERINEAL HIDRADENITIS ;  Surgeon: Pedro Earls, MD;  Location: [facility];  Service: General;  Laterality: N/A;  also in the pubis area  . LEFT HEART CATH AND CORONARY ANGIOGRAPHY N/A 12/26/2016   Procedure: LEFT HEART CATH AND CORONARY ANGIOGRAPHY;  Surgeon: [name], Peter M, MD;  Location: [place] CV LAB;  Service: Cardiovascular;  Laterality: N/A;  . MASS EXCISION Right 09/17/2013   Procedure: EXCISION MASS;  Surgeon: Pedro Earls, MD;  Location: [facility] ORS;  Service: General;  Laterality: Right;  . NISSEN FUNDOPLICATION  1610  . PERIPHERAL VASCULAR CATHETERIZATION N/A 11/03/2014   Procedure:  Lower Extremity Angiography;  Surgeon: Lorretta Harp, MD;  Location: [place] CV LAB;  Service: Cardiovascular;  Laterality: N/A;  . PERIPHERAL VASCULAR CATHETERIZATION N/A 01/29/2016   Procedure: Lower Extremity Angiography;  Surgeon: Lorretta Harp, MD;  Location: [place] CV LAB;  Service: Cardiovascular;  Laterality: N/A;  . POSTERIOR LUMBAR FUSION  2008 X 2  . REDUCTION MAMMAPLASTY  1996?  . TONSILLECTOMY AND ADENOIDECTOMY  1959 AND 2000  . UVULOPALATOPHARYNGOPLASTY, TONSILLECTOMY AND SEPTOPLASTY  2000's    Family History  Problem Relation Age of Onset  . Breast cancer Mother   . Heart disease Mother   . Hypertension Mother   . Diabetes Father   . Heart disease Father   . Stroke Father   . Heart disease Sister   . Hypertension Sister   . Hypertension Sister   .  Hyperlipidemia Sister   . Diabetes Sister   . Allergic rhinitis Sister   . Emphysema Unknown        great uncle  . Aneurysm Sister        brain  . Angioedema Neg Hx   . Asthma Neg Hx   . Eczema Neg Hx   . Immunodeficiency Neg Hx   . Urticaria Neg Hx    Social History   Tobacco Use  . Smoking status: Former Smoker    Packs/day: 0.33    Years: 41.00    Pack years: 13.53    Types: Cigarettes    Last attempt to quit: 01/15/2016    Years since quitting: 1.2  . Smokeless tobacco: Never Used  . Tobacco comment: Getting ready to start nicotine patches RX by Dr. Gwenlyn Found per pt.  Substance Use Topics  . Alcohol use: No    Alcohol/week: 0.0 oz  . Drug use: No    Comment: 05/09/2015.  "quit 07/27/1994"    Allergies  Allergen Reactions  . Sulfa Antibiotics Shortness Of Breath and Palpitations  . 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 Fish Allergy Hives and Swelling    Tongue swelling  . Iodine Swelling  . Metformin And Related Diarrhea  . Adhesive [Tape] Rash    Paper tape is ok  . Shellfish Allergy Swelling and Rash    Tongue swelling  ;  Current Meds   Medication Sig  . albuterol (PROAIR HFA) 108 (90 Base) MCG/ACT inhaler 1-2 inhalations every 4-6 hours as needed for cough or wheeze. (Patient taking differently: Inhale 1-2 puffs into the lungs every 4 (four) hours as needed for shortness of breath. )  . ALPRAZolam (XANAX) 0.5 MG tablet Take 0.25 mg by mouth daily as needed for anxiety or sleep.   Marland Kitchen amLODipine (NORVASC) 5 MG tablet Take 1 tablet (5 mg total) by mouth daily.  Marland Kitchen aspirin EC 81 MG tablet Take 81 mg by mouth daily.  . Azelastine HCl 0.15 % SOLN Place 2 sprays into both nostrils 2 (two) times daily. (Patient taking differently: Place 2 sprays into both nostrils 2 (two) times daily as needed (allergies). )  . Calcium Carb-Cholecalciferol (CALCIUM 600 + D PO) Take 1 tablet by mouth daily.  . citalopram (CELEXA) 10 MG tablet Take 10 mg by mouth daily.  . clopidogrel (PLAVIX) 75 MG tablet Take 1 tablet (75 mg total) by mouth daily with supper.  . cyclobenzaprine (FLEXERIL) 10 MG tablet Take 10 mg by mouth 3 (three) times daily as needed for muscle spasms.  Marland Kitchen dicyclomine (BENTYL) 20 MG tablet Take 1 tablet (20 mg total) by mouth 3 (three) times daily as needed for spasms.  . Fenofibric Acid 105 MG TABS Take 1 tablet by mouth daily.  Marland Kitchen HYDROcodone-acetaminophen (NORCO/VICODIN) 5-325 MG tablet Take 1 tablet by mouth 2 (two) times daily as needed. for pain  . insulin aspart (NOVOLOG) 100 UNIT/ML injection Inject 5-12 Units into the skin 2 (two) times daily before a meal. Per sliding scale  . Insulin Glargine (TOUJEO SOLOSTAR) 300 UNIT/ML SOPN Inject 37 Units into the skin every morning.   . isosorbide mononitrate (IMDUR) 30 MG 24 hr tablet Take 1 tablet (30 mg total) by mouth daily.  Marland Kitchen loratadine (CLARITIN) 10 MG tablet Take 10 mg by mouth daily as needed for allergies.  . mupirocin cream (BACTROBAN) 2 % Apply 1 application topically 2 (two) times daily as needed (wound  care (boils)).   . nitroGLYCERIN (NITROSTAT) 0.4 MG SL tablet Place 1  tablet (0.4 mg total) under the tongue every 5 (five) minutes as needed for chest pain. Maximum 3 doses  . pantoprazole (PROTONIX) 40 MG tablet TAKE 1 TABLET BY MOUTH EVERY DAY  . propranolol ER (INDERAL LA) 60 MG 24 hr capsule Take 1 capsule (60 mg total) by mouth every evening.  . rosuvastatin (CRESTOR) 40 MG tablet Take 40 mg by mouth daily.  . SUMAtriptan (IMITREX) 50 MG tablet Take 50 mg by mouth every 2 (two) hours as needed for migraine.  . temazepam (RESTORIL) 30 MG capsule Take 30 mg by mouth at bedtime.  . TRULICITY 5.10 CH/8.5ID SOPN Inject 0.75 mg into the skin every Monday. On Fridays    BP 127/78   Pulse 69   Ht 5\' 4"  (1.626 m)   Wt 143 lb (64.9 kg)   BMI 24.55 kg/m   Physical Exam  Constitutional: She is oriented to person, place, and time. She appears well-developed and well-nourished.  HENT:  Head: Normocephalic and atraumatic.  Right Ear: External ear normal.  Left Ear: External ear normal.  Mouth/Throat: Oropharynx is clear and moist. No oropharyngeal exudate.  Eyes: Conjunctivae are normal. Pupils are equal, round, and reactive to light.  Neck: Normal range of motion. Neck supple. No JVD present. No tracheal deviation present. No thyromegaly present.  Cardiovascular: Normal rate, regular rhythm and intact distal pulses.  No murmur heard. Pulmonary/Chest: Effort normal. No stridor. No respiratory distress. She has no wheezes. She exhibits no tenderness.  Abdominal: Soft. Bowel sounds are normal. She exhibits no distension and no mass. There is no tenderness. There is no rebound.  Lymphadenopathy:       Right: No inguinal adenopathy present.       Left: No inguinal adenopathy present.  Neurological: She is alert and oriented to person, place, and time. She displays normal reflexes. No cranial nerve deficit or sensory deficit. She exhibits normal muscle tone. Coordination normal.  Skin: Skin is warm, dry and intact. Capillary refill takes less than 2 seconds. No rash  noted.     Psychiatric: She has a normal mood and affect. Her behavior is normal. Judgment and thought content normal.  Vitals reviewed.   Right Hip Exam  Right hip exam is normal.   Tenderness  The patient is experiencing tenderness in the greater trochanter.  Range of Motion  The patient has normal right hip ROM.  Muscle Strength  The patient has normal right hip strength.  Tests  FABER: negative Ober: negative  Other  Scars: present Sensation: normal Pulse: present   Left Hip Exam  Left hip exam is normal.  Tenderness  The patient is experiencing no tenderness.   Range of Motion  The patient has normal left hip ROM.  Muscle Strength  The patient has normal left hip strength.   Tests  FABER: negative Ober: negative  Other  Erythema: absent Scars: absent Sensation: normal Pulse: present      Medical decision making  AP pelvis and right hip AP lateral.   [place]  Radiology report dictated by Dr. Aline Brochure  Painful right hip  AP pelvis and AP lateral right hip  Hip joint spaces are normal.  There is calcification in the soft tissue near the greater trochanter as well as superficial to it  This corresponds to physical exam findings of indurated tissue near the greater trochanter.  No evidence of soft tissue calcification or bony involvement  Impression normal hip joint with abnormal calcification in the soft tissue measuring 55 x 43 mm    Encounter Diagnoses  Name Primary?  . Pain of right hip joint Yes  . Greater trochanteric bursitis of right hip   . Mass of right thigh      PLAN:   Injection right greater trochanter  Patient is given verbal consent timeout was taken to confirm injection right hip with Depo-Medrol 40 and lidocaine 1%  Skin prepped with alcohol and chloride and the area was injected using a 25-gauge needle.  There were no complications.  The patient was observed for any anaphylactic reaction and  there was none    MRI with and without contrast will be ordered to evaluate soft tissue mass

## 2017-04-09 NOTE — Addendum Note (Signed)
Addended byCandice Camp on: 04/09/2017 08:00 AM   Modules accepted: Orders

## 2017-04-16 ENCOUNTER — Ambulatory Visit (HOSPITAL_COMMUNITY)
Admission: RE | Admit: 2017-04-16 | Discharge: 2017-04-16 | Disposition: A | Discharge status: Home / Self Care | Payer: Medicare Other | Source: Ambulatory Visit | Attending: Orthopedic Surgery | Admitting: Orthopedic Surgery

## 2017-04-16 DIAGNOSIS — M25561 Pain in right knee: Secondary | ICD-10-CM | POA: Diagnosis not present

## 2017-04-16 DIAGNOSIS — M7061 Trochanteric bursitis, right hip: Secondary | ICD-10-CM

## 2017-04-16 DIAGNOSIS — M25551 Pain in right hip: Secondary | ICD-10-CM | POA: Diagnosis not present

## 2017-04-16 LAB — POCT I-STAT CREATININE: Creatinine, Ser: 1.3 mg/dL — ABNORMAL HIGH (ref 0.44–1.00)

## 2017-04-16 MED ORDER — GADOBENATE DIMEGLUMINE 529 MG/ML IV SOLN
10.0000 mL | Freq: Once | INTRAVENOUS | Status: AC | PRN
  Administered 2017-04-16: 10 mL via INTRAVENOUS

## 2017-04-18 ENCOUNTER — Ambulatory Visit: Payer: Medicare Other | Admitting: Orthopedic Surgery

## 2017-04-21 ENCOUNTER — Ambulatory Visit: Payer: Medicare Other | Admitting: Orthopedic Surgery

## 2017-04-21 ENCOUNTER — Encounter: Payer: Self-pay | Admitting: Orthopedic Surgery

## 2017-04-21 NOTE — Progress Notes (Signed)
61 year old female was called to discuss her MRI read regarding what appear to be calcifications in the soft tissue around the right hip.  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

## 2017-05-13 ENCOUNTER — Other Ambulatory Visit (HOSPITAL_COMMUNITY): Payer: Self-pay | Admitting: Internal Medicine

## 2017-05-13 DIAGNOSIS — Z1231 Encounter for screening mammogram for malignant neoplasm of breast: Secondary | ICD-10-CM

## 2017-05-15 DIAGNOSIS — N644 Mastodynia: Secondary | ICD-10-CM | POA: Diagnosis not present

## 2017-05-17 ENCOUNTER — Other Ambulatory Visit: Payer: Self-pay | Admitting: Internal Medicine

## 2017-05-17 DIAGNOSIS — N644 Mastodynia: Secondary | ICD-10-CM

## 2017-06-04 DIAGNOSIS — M9902 Segmental and somatic dysfunction of thoracic region: Secondary | ICD-10-CM | POA: Diagnosis not present

## 2017-06-04 DIAGNOSIS — M25552 Pain in left hip: Secondary | ICD-10-CM | POA: Diagnosis not present

## 2017-06-04 DIAGNOSIS — M25551 Pain in right hip: Secondary | ICD-10-CM | POA: Diagnosis not present

## 2017-06-04 DIAGNOSIS — M9905 Segmental and somatic dysfunction of pelvic region: Secondary | ICD-10-CM | POA: Diagnosis not present

## 2017-06-04 DIAGNOSIS — M9903 Segmental and somatic dysfunction of lumbar region: Secondary | ICD-10-CM | POA: Diagnosis not present

## 2017-06-04 DIAGNOSIS — M545 Low back pain: Secondary | ICD-10-CM | POA: Diagnosis not present

## 2017-06-05 DIAGNOSIS — M25552 Pain in left hip: Secondary | ICD-10-CM | POA: Diagnosis not present

## 2017-06-05 DIAGNOSIS — M9903 Segmental and somatic dysfunction of lumbar region: Secondary | ICD-10-CM | POA: Diagnosis not present

## 2017-06-05 DIAGNOSIS — M9905 Segmental and somatic dysfunction of pelvic region: Secondary | ICD-10-CM | POA: Diagnosis not present

## 2017-06-05 DIAGNOSIS — M545 Low back pain: Secondary | ICD-10-CM | POA: Diagnosis not present

## 2017-06-05 DIAGNOSIS — M9902 Segmental and somatic dysfunction of thoracic region: Secondary | ICD-10-CM | POA: Diagnosis not present

## 2017-06-11 DIAGNOSIS — M545 Low back pain: Secondary | ICD-10-CM | POA: Diagnosis not present

## 2017-06-11 DIAGNOSIS — M25552 Pain in left hip: Secondary | ICD-10-CM | POA: Diagnosis not present

## 2017-06-11 DIAGNOSIS — M9902 Segmental and somatic dysfunction of thoracic region: Secondary | ICD-10-CM | POA: Diagnosis not present

## 2017-06-11 DIAGNOSIS — M9905 Segmental and somatic dysfunction of pelvic region: Secondary | ICD-10-CM | POA: Diagnosis not present

## 2017-06-11 DIAGNOSIS — M9903 Segmental and somatic dysfunction of lumbar region: Secondary | ICD-10-CM | POA: Diagnosis not present

## 2017-06-16 ENCOUNTER — Ambulatory Visit (HOSPITAL_COMMUNITY): Payer: Medicare Other

## 2017-06-18 ENCOUNTER — Encounter (HOSPITAL_COMMUNITY): Payer: Self-pay

## 2017-06-18 ENCOUNTER — Ambulatory Visit (HOSPITAL_COMMUNITY)
Admission: RE | Admit: 2017-06-18 | Discharge: 2017-06-18 | Disposition: A | Discharge status: Home / Self Care | Payer: Medicare Other | Source: Ambulatory Visit | Attending: Internal Medicine | Admitting: Internal Medicine

## 2017-06-18 DIAGNOSIS — M9905 Segmental and somatic dysfunction of pelvic region: Secondary | ICD-10-CM | POA: Diagnosis not present

## 2017-06-18 DIAGNOSIS — Z1231 Encounter for screening mammogram for malignant neoplasm of breast: Secondary | ICD-10-CM

## 2017-06-18 DIAGNOSIS — M545 Low back pain: Secondary | ICD-10-CM | POA: Diagnosis not present

## 2017-06-18 DIAGNOSIS — M25552 Pain in left hip: Secondary | ICD-10-CM | POA: Diagnosis not present

## 2017-06-18 DIAGNOSIS — M9902 Segmental and somatic dysfunction of thoracic region: Secondary | ICD-10-CM | POA: Diagnosis not present

## 2017-06-18 DIAGNOSIS — M9903 Segmental and somatic dysfunction of lumbar region: Secondary | ICD-10-CM | POA: Diagnosis not present

## 2017-06-20 DIAGNOSIS — M9905 Segmental and somatic dysfunction of pelvic region: Secondary | ICD-10-CM | POA: Diagnosis not present

## 2017-06-20 DIAGNOSIS — M9903 Segmental and somatic dysfunction of lumbar region: Secondary | ICD-10-CM | POA: Diagnosis not present

## 2017-06-20 DIAGNOSIS — M9902 Segmental and somatic dysfunction of thoracic region: Secondary | ICD-10-CM | POA: Diagnosis not present

## 2017-06-20 DIAGNOSIS — M545 Low back pain: Secondary | ICD-10-CM | POA: Diagnosis not present

## 2017-06-20 DIAGNOSIS — M25552 Pain in left hip: Secondary | ICD-10-CM | POA: Diagnosis not present

## 2017-07-16 DIAGNOSIS — E782 Mixed hyperlipidemia: Secondary | ICD-10-CM | POA: Diagnosis not present

## 2017-07-16 DIAGNOSIS — I10 Essential (primary) hypertension: Secondary | ICD-10-CM | POA: Diagnosis not present

## 2017-07-16 DIAGNOSIS — E1140 Type 2 diabetes mellitus with diabetic neuropathy, unspecified: Secondary | ICD-10-CM | POA: Diagnosis not present

## 2017-07-18 DIAGNOSIS — E1121 Type 2 diabetes mellitus with diabetic nephropathy: Secondary | ICD-10-CM | POA: Diagnosis not present

## 2017-07-18 DIAGNOSIS — I739 Peripheral vascular disease, unspecified: Secondary | ICD-10-CM | POA: Diagnosis not present

## 2017-07-18 DIAGNOSIS — I10 Essential (primary) hypertension: Secondary | ICD-10-CM | POA: Diagnosis not present

## 2017-07-18 DIAGNOSIS — E782 Mixed hyperlipidemia: Secondary | ICD-10-CM | POA: Diagnosis not present

## 2017-07-18 DIAGNOSIS — G4700 Insomnia, unspecified: Secondary | ICD-10-CM | POA: Diagnosis not present

## 2017-07-28 ENCOUNTER — Ambulatory Visit: Payer: Medicare Other | Attending: Internal Medicine | Admitting: Neurology

## 2017-07-28 DIAGNOSIS — G4733 Obstructive sleep apnea (adult) (pediatric): Secondary | ICD-10-CM

## 2017-07-28 DIAGNOSIS — R0683 Snoring: Secondary | ICD-10-CM

## 2017-07-29 NOTE — Progress Notes (Signed)
Please note the technical problems with O2 finger probe

## 2017-07-31 NOTE — Procedures (Signed)
Mentor A. Merlene Laughter, MD     [email or web address]             HOME SLEEP STUDY  LOCATION: [facility]  Patient Name: [name], [name] Date: 07/28/2017 Gender: Female D.O.B: January 31, 1956 Age (years): 61 Referring Provider: Delphina Cahill Height (inches): 75 Interpreting Physician: Phillips Odor MD, ABSM Weight (lbs): 143 RPSGT: Rosebud Poles BMI: 25 MRN: [id number] Neck Size: CLINICAL INFORMATION Sleep Study Type: HST     Indication for sleep study: OSA, Snoring     Epworth Sleepiness Score: NA  SLEEP STUDY TECHNIQUE A multi-channel overnight portable sleep study was performed. The channels recorded were: nasal airflow, thoracic respiratory movement, and oxygen saturation with a pulse oximetry. Snoring was also monitored.  MEDICATIONS Patient self administered medications include: N/A.  Current Outpatient Medications:  .  albuterol (PROAIR HFA) 108 (90 Base) MCG/ACT inhaler, 1-2 inhalations every 4-6 hours as needed for cough or wheeze. (Patient taking differently: Inhale 1-2 puffs into the lungs every 4 (four) hours as needed for shortness of breath. ), Disp: 1 Inhaler, Rfl: 1 .  ALPRAZolam (XANAX) 0.5 MG tablet, Take 0.25 mg by mouth daily as needed for anxiety or sleep. , Disp: , Rfl:  .  amLODipine (NORVASC) 5 MG tablet, Take 1 tablet (5 mg total) by mouth daily., Disp: 30 tablet, Rfl: 11 .  aspirin EC 81 MG tablet, Take 81 mg by mouth daily., Disp: , Rfl:  .  Azelastine HCl 0.15 % SOLN, Place 2 sprays into both nostrils 2 (two) times daily. (Patient taking differently: Place 2 sprays into both nostrils 2 (two) times daily as needed (allergies). ), Disp: 30 mL, Rfl: 5 .  Calcium Carb-Cholecalciferol (CALCIUM 600 + D PO), Take 1 tablet by mouth daily., Disp: , Rfl:  .  citalopram (CELEXA) 10 MG tablet, Take 10 mg by mouth daily., Disp: , Rfl:  .  clopidogrel (PLAVIX) 75 MG tablet, Take 1 tablet (75 mg total) by mouth daily with supper., Disp: 30  tablet, Rfl: 5 .  cyclobenzaprine (FLEXERIL) 10 MG tablet, Take 10 mg by mouth 3 (three) times daily as needed for muscle spasms., Disp: , Rfl:  .  dicyclomine (BENTYL) 20 MG tablet, Take 1 tablet (20 mg total) by mouth 3 (three) times daily as needed for spasms., Disp: , Rfl:  .  EPINEPHrine 0.3 mg/0.3 mL IJ SOAJ injection, Inject 0.3 mLs (0.3 mg total) into the muscle once., Disp: 1 Device, Rfl: 1 .  Fenofibric Acid 105 MG TABS, Take 1 tablet by mouth daily., Disp: , Rfl:  .  HYDROcodone-acetaminophen (NORCO/VICODIN) 5-325 MG tablet, Take 1 tablet by mouth 2 (two) times daily as needed. for pain, Disp: , Rfl: 0 .  insulin aspart (NOVOLOG) 100 UNIT/ML injection, Inject 5-12 Units into the skin 2 (two) times daily before a meal. Per sliding scale, Disp: , Rfl:  .  Insulin Glargine (TOUJEO SOLOSTAR) 300 UNIT/ML SOPN, Inject 37 Units into the skin every morning. , Disp: , Rfl:  .  insulin lispro (HUMALOG) 100 UNIT/ML injection, Inject 5-12 Units into the skin 2 (two) times daily before a meal. Per sliding scale, Disp: , Rfl:  .  isosorbide mononitrate (IMDUR) 30 MG 24 hr tablet, Take 1 tablet (30 mg total) by mouth daily., Disp: 30 tablet, Rfl: 5 .  loratadine (CLARITIN) 10 MG tablet, Take 10 mg by mouth daily as needed for allergies., Disp: , Rfl:  .  mupirocin cream (BACTROBAN) 2 %, Apply 1 application topically 2 (two) times  daily as needed (wound care (boils)). , Disp: , Rfl:  .  nitroGLYCERIN (NITROSTAT) 0.4 MG SL tablet, Place 1 tablet (0.4 mg total) under the tongue every 5 (five) minutes as needed for chest pain. Maximum 3 doses, Disp: 25 tablet, Rfl: 1 .  pantoprazole (PROTONIX) 40 MG tablet, TAKE 1 TABLET BY MOUTH EVERY DAY, Disp: 30 tablet, Rfl: 11 .  propranolol ER (INDERAL LA) 60 MG 24 hr capsule, Take 1 capsule (60 mg total) by mouth every evening., Disp: 30 capsule, Rfl: 5 .  rosuvastatin (CRESTOR) 40 MG tablet, Take 40 mg by mouth daily., Disp: , Rfl:  .  SUMAtriptan (IMITREX) 50 MG  tablet, Take 50 mg by mouth every 2 (two) hours as needed for migraine., Disp: , Rfl:  .  temazepam (RESTORIL) 30 MG capsule, Take 30 mg by mouth at bedtime., Disp: , Rfl:  .  TRULICITY 6.72 CN/4.7SJ SOPN, Inject 0.75 mg into the skin every Monday. On Fridays, Disp: , Rfl:   SLEEP ARCHITECTURE Patient was studied for 701.4 minutes. The sleep efficiency was 97.5 % and the patient was supine for 21.4%. The arousal index was 0.0 per hour.  RESPIRATORY PARAMETERS The overall AHI was 6.7 per hour, with a central apnea index of 1.3 per hour.  The oxygen nadir was 62% during sleep.     CARDIAC DATA Mean heart rate during sleep was 73.1 bpm.  IMPRESSIONS Mild obstructive sleep apnea occurred during this study (AHI = 6.7/h).The Severity does not require positive pressure treatment.      Delano Metz, MD Diplomate, American Board of Sleep Medicine.  ELECTRONICALLY SIGNED ON:  07/31/2017, 9:58 AM [place] PH: [phone]   FX: [phone] [place]

## 2017-08-05 ENCOUNTER — Encounter: Payer: Self-pay | Admitting: Internal Medicine

## 2017-08-05 ENCOUNTER — Other Ambulatory Visit (INDEPENDENT_AMBULATORY_CARE_PROVIDER_SITE_OTHER): Payer: Medicare Other

## 2017-08-05 ENCOUNTER — Ambulatory Visit: Payer: Medicare Other | Admitting: Internal Medicine

## 2017-08-05 VITALS — BP 94/60 | HR 68 | Ht 63.0 in | Wt 148.0 lb

## 2017-08-05 DIAGNOSIS — R1013 Epigastric pain: Secondary | ICD-10-CM | POA: Diagnosis not present

## 2017-08-05 DIAGNOSIS — R110 Nausea: Secondary | ICD-10-CM

## 2017-08-05 LAB — COMPREHENSIVE METABOLIC PANEL
ALT: 7 U/L (ref 0–35)
AST: 10 U/L (ref 0–37)
Albumin: 4.2 g/dL (ref 3.5–5.2)
Alkaline Phosphatase: 58 U/L (ref 39–117)
BUN: 23 mg/dL (ref 6–23)
CO2: 30 mEq/L (ref 19–32)
Calcium: 10.9 mg/dL — ABNORMAL HIGH (ref 8.4–10.5)
Chloride: 101 mEq/L (ref 96–112)
Creatinine, Ser: 2.08 mg/dL — ABNORMAL HIGH (ref 0.40–1.20)
GFR: 30.98 mL/min — ABNORMAL LOW (ref >60.00)
Glucose, Bld: 214 mg/dL — ABNORMAL HIGH (ref 70–99)
Potassium: 4 mEq/L (ref 3.5–5.1)
Sodium: 137 mEq/L (ref 135–145)
Total Bilirubin: 0.8 mg/dL (ref 0.2–1.2)
Total Protein: 7.6 g/dL (ref 6.0–8.3)

## 2017-08-05 LAB — AMYLASE: Amylase: 30 U/L (ref 27–131)

## 2017-08-05 LAB — CBC WITH DIFFERENTIAL/PLATELET
Basophils Absolute: 0.1 10*3/uL (ref 0.0–0.1)
Basophils Relative: 1.5 % (ref 0.0–3.0)
Eosinophils Absolute: 0.1 10*3/uL (ref 0.0–0.7)
Eosinophils Relative: 1.4 % (ref 0.0–5.0)
HCT: 38.1 % (ref 36.0–46.0)
Hemoglobin: 12.5 g/dL (ref 12.0–15.0)
Lymphocytes Relative: 25 % (ref 12.0–46.0)
Lymphs Abs: 1.6 10*3/uL (ref 0.7–4.0)
MCHC: 32.8 g/dL (ref 30.0–36.0)
MCV: 82.6 fl (ref 78.0–100.0)
Monocytes Absolute: 0.6 10*3/uL (ref 0.1–1.0)
Monocytes Relative: 9 % (ref 3.0–12.0)
Neutro Abs: 4.1 10*3/uL (ref 1.4–7.7)
Neutrophils Relative %: 63.1 % (ref 43.0–77.0)
Platelets: 231 10*3/uL (ref 150.0–400.0)
RBC: 4.61 Mil/uL (ref 3.87–5.11)
RDW: 13.6 % (ref 11.5–15.5)
WBC: 6.4 10*3/uL (ref 4.0–10.5)

## 2017-08-05 LAB — LIPASE: Lipase: 20 U/L (ref 11.0–59.0)

## 2017-08-05 MED ORDER — METOCLOPRAMIDE HCL 5 MG PO TABS: 5.0000 mg | ORAL_TABLET | Freq: Three times a day (TID) | ORAL | 0 refills | Status: DC

## 2017-08-05 NOTE — Progress Notes (Signed)
[name] 62 y.o. 02-25-1956 [id number]  Assessment & Plan:   Encounter Diagnoses  Name Primary?  . Abdominal pain, epigastric Yes  . Nausea without vomiting     EGD to evaluate - leave on on Plavix CBC, CMET, amylase nad lipase - returned w/ elevated Ca+, glucose and creat now > 2 Reglan 5 tidac/hs for Tx Step 3 gastroparesis diet (may be the problem - I.e. Gastroparesis)  May need cross sectional imaging  I appreciate the opportunity to care for her. [id number], Jennifer Areola, MD\   Subjective:   Chief Complaint: epigastric pain, regurgitation  HPI Here with weeks or more of post-prandial epigastric pain, nausea and regurgitation. Hard to find any food she can tolerate. Has had in past also. I thought was from trulicity - was held at one point but now back on and was well for years on it. No other new meds. Hx mild elevated lipase 2014  - CT did not show a mass but did have aortic and gut vessel calcifications and atherosclerosis - EGD was negative. That resolved. Then in 9357 I thought Trulicity was the problem as she had pain after starting that. Bowel habits ok. Weight actually up some.  Wt Readings from Last 3 Encounters:  08/05/17 148 lb (67.1 kg)  04/08/17 143 lb (64.9 kg)  04/02/17 144 lb (65.3 kg)    Allergies  Allergen Reactions  . Sulfa Antibiotics Shortness Of Breath and Palpitations  . 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 Fish Allergy Hives and Swelling    Tongue swelling  . Iodine Swelling  . Metformin And Related Diarrhea  . Adhesive [Tape] Rash    Paper tape is ok  . Shellfish Allergy Swelling and Rash    Tongue swelling   Current Meds  Medication Sig  . albuterol (PROAIR HFA) 108 (90 Base) MCG/ACT inhaler 1-2 inhalations every 4-6 hours as needed for cough or wheeze. (Patient taking differently: Inhale 1-2 puffs into the lungs every 4 (four) hours as needed for shortness of breath. )  . ALPRAZolam  (XANAX) 0.5 MG tablet Take 0.25 mg by mouth daily as needed for anxiety or sleep.   Marland Kitchen amLODipine (NORVASC) 5 MG tablet Take 1 tablet (5 mg total) by mouth daily.  Marland Kitchen aspirin EC 81 MG tablet Take 81 mg by mouth daily.  . Azelastine HCl 0.15 % SOLN Place 2 sprays into both nostrils 2 (two) times daily. (Patient taking differently: Place 2 sprays into both nostrils 2 (two) times daily as needed (allergies). )  . Calcium Carb-Cholecalciferol (CALCIUM 600 + D PO) Take 1 tablet by mouth daily.  . citalopram (CELEXA) 10 MG tablet Take 10 mg by mouth daily.  . clopidogrel (PLAVIX) 75 MG tablet Take 1 tablet (75 mg total) by mouth daily with supper.  . cyclobenzaprine (FLEXERIL) 10 MG tablet Take 10 mg by mouth 3 (three) times daily as needed for muscle spasms.  Marland Kitchen dicyclomine (BENTYL) 20 MG tablet Take 1 tablet (20 mg total) by mouth 3 (three) times daily as needed for spasms.  Marland Kitchen EPINEPHrine 0.3 mg/0.3 mL IJ SOAJ injection Inject 0.3 mLs (0.3 mg total) into the muscle once.  . Fenofibric Acid 105 MG TABS Take 1 tablet by mouth daily.  Marland Kitchen HYDROcodone-acetaminophen (NORCO/VICODIN) 5-325 MG tablet Take 1 tablet by mouth 2 (two) times daily as needed. for pain  . insulin aspart (NOVOLOG) 100 UNIT/ML injection Inject 5-12 Units into the skin  2 (two) times daily before a meal. Per sliding scale  . Insulin Glargine (TOUJEO SOLOSTAR) 300 UNIT/ML SOPN Inject 37 Units into the skin every morning.   . insulin lispro (HUMALOG) 100 UNIT/ML injection Inject 5-12 Units into the skin 2 (two) times daily before a meal. Per sliding scale  . isosorbide mononitrate (IMDUR) 30 MG 24 hr tablet Take 1 tablet (30 mg total) by mouth daily.  Marland Kitchen loratadine (CLARITIN) 10 MG tablet Take 10 mg by mouth daily as needed for allergies.  . mupirocin cream (BACTROBAN) 2 % Apply 1 application topically 2 (two) times daily as needed (wound care (boils)).   . nitroGLYCERIN (NITROSTAT) 0.4 MG SL tablet Place 1 tablet (0.4 mg total) under the tongue  every 5 (five) minutes as needed for chest pain. Maximum 3 doses  . pantoprazole (PROTONIX) 40 MG tablet TAKE 1 TABLET BY MOUTH EVERY DAY  . propranolol ER (INDERAL LA) 60 MG 24 hr capsule Take 1 capsule (60 mg total) by mouth every evening.  . rosuvastatin (CRESTOR) 40 MG tablet Take 40 mg by mouth daily.  . SUMAtriptan (IMITREX) 50 MG tablet Take 50 mg by mouth every 2 (two) hours as needed for migraine.  . temazepam (RESTORIL) 30 MG capsule Take 30 mg by mouth at bedtime.  . TRULICITY 3.97 QB/3.4LP SOPN Inject 0.75 mg into the skin every Monday. On Fridays   Past Medical History:  Diagnosis Date  . 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 Anemia    hx  . Angioedema 07/10/2015  . Anxiety   . Arthritis    "neck, left hand" (09/14/2012)  . Asthma   . Cancer ([place]) 1985   ovarian, no treatment except surgery  . Complication of anesthesia    OCCASIONAL TROUBLE TURNING NECK TO RIGHT  . Critical lower limb ischemia    10/2014 s/p L SFA stenting  . Diverticulosis of colon with hemorrhage April 2013  . GERD (gastroesophageal reflux disease)   . H/O hiatal hernia   . Heart attack [facility])    2003 mild MI, March 2013 mild MI  . Hidradenitis    groin  . History of blood transfusion 1985 AND 2013  . Hyperlipidemia   . Hypertension   . Irritable bowel syndrome   . Left-sided weakness    since stroke, left eye trouble seeing  . Migraines   . Mild CAD    a. Cath 09/2010: mild luminal irregularities of LAD, 30% prox RCA and 20-30% mRCA, EF 65%.  . Neuropathy   . Obesity   . PAD (peripheral artery disease) ([place])    a. critical limb ischemia s/p PTA/stenting of L SFA 10/2014. c. occ prior SFA stent by angio 01/2016, for possible PV bypass.  . Pneumonia    baby  . Recurrent upper respiratory infection (URI)   . S/P arterial stent-mid Lt SFA 11/03/14 11/04/2014  . Schatzki's ring   . Sinus problem   . Stomach ulcer 1972   non-bleeding  . Stroke ([place]) 07-2007, 07-2008, 07-2009   total 3 strokes; mild left  sided weakness and left eye "jumps".  . Type II diabetes mellitus ([place])   . Vitamin B 12 deficiency 07-15-2013   Past Surgical History:  Procedure Laterality Date  . ABDOMINAL HYSTERECTOMY  1985  . ADENOIDECTOMY    . ANTERIOR CERVICAL DECOMP/DISCECTOMY FUSION  2002  . ANTERIOR CERVICAL DECOMP/DISCECTOMY FUSION N/A 04/08/2014   Procedure: Cervical Six-Seven ANTERIOR CERVICAL DECOMPRESSION/DISCECTOMY FUSION Plating and Bonegraft  2 LEVELS;  Surgeon: Ashok Pall, MD;  Location: [place] NEURO ORS;  Service: Neurosurgery;  Laterality: N/A;  Cervical Six-Seven ANTERIOR CERVICAL DECOMPRESSION/DISCECTOMY FUSION Plating and Bonegraft  2 LEVELS  . APPENDECTOMY  1985  . AXILLARY HIDRADENITIS EXCISION  1990-2008   bilateral  . BACK SURGERY    . BREAST BIOPSY Right 2007  . BREAST CYST EXCISION Right 2008  . BREAST REDUCTION SURGERY    . CARDIAC CATHETERIZATION  2004   mild disease  . CATARACT EXTRACTION W/PHACO Left 05/16/2015   Procedure: CATARACT EXTRACTION PHACO AND INTRAOCULAR LENS PLACEMENT (IOC);  Surgeon: Rutherford Guys, MD;  Location: [facility];  Service: Ophthalmology;  Laterality: Left;  CDE: 4.24  . CATARACT EXTRACTION W/PHACO Right 05/30/2015   Procedure: CATARACT EXTRACTION RIGHT EYE PHACO AND INTRAOCULAR LENS PLACEMENT ;  Surgeon: Rutherford Guys, MD;  Location: [facility];  Service: Ophthalmology;  Laterality: Right;  CDE:4.08  . CHOLECYSTECTOMY  1990's  . COLONOSCOPY  08/12/2011   Procedure: COLONOSCOPY;  Surgeon: Ladene Artist, MD,FACG;  Location[facility] ENDOSCOPY;  Service: Endoscopy;  Laterality: N/A;  . COLONOSCOPY  06/19/2006  . cyst thigh Right   . ESOPHAGOGASTRODUODENOSCOPY  08/12/2011   Procedure: ESOPHAGOGASTRODUODENOSCOPY (EGD);  Surgeon: Ladene Artist, MD,FACG;  Location: [facility] ENDOSCOPY;  Service: Endoscopy;  Laterality: N/A;  . ESOPHAGOGASTRODUODENOSCOPY  06/04/2005  . FEMORAL-POPLITEAL BYPASS GRAFT Left 06/19/2016   Procedure: Left Leg BYPASS GRAFT FEMORAL-POPLITEAL ARTERY;  Surgeon: Serafina Mitchell, MD;  Location: [place];  Service: Vascular;  Laterality: Left;  . GIVENS CAPSULE STUDY  08/13/2011   Procedure: GIVENS CAPSULE STUDY;  Surgeon: Ladene Artist, MD,FACG;  Location: [facility] ENDOSCOPY;  Service: Endoscopy;  Laterality: N/A;  . HAMMER TOE SURGERY Bilateral ~ 2000  . HIATAL HERNIA REPAIR    . HYDRADENITIS EXCISION  01/2011; 03/2012   'groin and abdomen; 03/2012" (09/14/2012)  . HYDRADENITIS EXCISION  04/01/2012   Procedure: EXCISION HYDRADENITIS GROIN;  Surgeon: Pedro Earls, MD;  Location: [facility] ORS;  Service: General;  Laterality: Bilateral;  Excision of Hydradenitis of Perineum  . HYDRADENITIS EXCISION N/A 09/17/2013   Procedure: EXCISION PERINEAL HIDRADENITIS ;  Surgeon: Pedro Earls, MD;  Location: [facility] ORS;  Service: General;  Laterality: N/A;  also in the pubis area  . LEFT HEART CATH AND CORONARY ANGIOGRAPHY N/A 12/26/2016   Procedure: LEFT HEART CATH AND CORONARY ANGIOGRAPHY;  Surgeon: [name], Peter M, MD;  Location: [place] CV LAB;  Service: Cardiovascular;  Laterality: N/A;  . MASS EXCISION Right 09/17/2013   Procedure: EXCISION MASS;  Surgeon: Pedro Earls, MD;  Location: [facility] ORS;  Service: General;  Laterality: Right;  . NISSEN FUNDOPLICATION  3614  . PERIPHERAL VASCULAR CATHETERIZATION N/A 11/03/2014   Procedure: Lower Extremity Angiography;  Surgeon: Lorretta Harp, MD;  Location: [place] CV LAB;  Service: Cardiovascular;  Laterality: N/A;  . PERIPHERAL VASCULAR CATHETERIZATION N/A 01/29/2016   Procedure: Lower Extremity Angiography;  Surgeon: Lorretta Harp, MD;  Location: [place] CV LAB;  Service: Cardiovascular;  Laterality: N/A;  . POSTERIOR LUMBAR FUSION  2008 X 2  . REDUCTION MAMMAPLASTY  1996?  . TONSILLECTOMY AND ADENOIDECTOMY  1959 AND 2000  . 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  Divorced, living alone in [place].     family history includes Allergic rhinitis in her sister; Aneurysm in her sister; Breast cancer in  her mother; Diabetes in her father and sister; Emphysema in her unknown relative; Heart disease in her father, mother, and sister; Hyperlipidemia in her sister; Hypertension in her mother, sister, and sister; Stroke in her father.   Review of Systems As per HPI  Objective:   Physical Exam @BP  94/60 (BP Location: Left Arm, Patient Position: Sitting, Cuff Size: Normal)   Pulse 68   Ht 5\' 3"  (1.6 m) Comment: height measured without shoes  Wt 148 lb (67.1 kg)   BMI 26.22 kg/m @  General:  NAD Eyes:   anicteric Lungs:  clear Heart::  S1S2 no rubs, murmurs or gallops Abdomen:  soft and tender diffusely, BS+ mildly obese Ext:   no edema, cyanosis or clubbing    Data Reviewed:   See HPI

## 2017-08-05 NOTE — Patient Instructions (Addendum)
You have been scheduled for an endoscopy. Please follow written instructions given to you at your visit today. If you use inhalers (even only as needed), please bring them with you on the day of your procedure.  Stay on your Plavix.  We have sent the following medications to your pharmacy for you to pick up at your convenience: Generic Reglan  Your physician has requested that you go to the basement for the following lab work before leaving today:  CBC/diff, Amylase, Lipase, CMET   We are giving you a gastroparesis diet handout to follow step #3.   I appreciate the opportunity to care for you. Silvano Rusk, MD, [facility]

## 2017-08-06 ENCOUNTER — Other Ambulatory Visit: Payer: Self-pay

## 2017-08-06 NOTE — Progress Notes (Signed)
Labs show elevated glucose Worsened kidney function  Mildly elevated calcium  Stop calcium supplement  I Will send labs to PCP also - she will need follow-up on rising creatinine (deterioriating kidney function) she should contact and see PCP [name]

## 2017-08-19 ENCOUNTER — Encounter: Payer: Self-pay | Admitting: Internal Medicine

## 2017-08-19 ENCOUNTER — Telehealth: Payer: Self-pay

## 2017-08-19 ENCOUNTER — Ambulatory Visit (AMBULATORY_SURGERY_CENTER): Payer: Medicare Other | Admitting: Internal Medicine

## 2017-08-19 ENCOUNTER — Other Ambulatory Visit: Payer: Self-pay | Admitting: Internal Medicine

## 2017-08-19 VITALS — BP 99/67 | HR 66 | Temp 97.1°F | Resp 20 | Ht 63.0 in | Wt 148.0 lb

## 2017-08-19 DIAGNOSIS — Z8719 Personal history of other diseases of the digestive system: Secondary | ICD-10-CM

## 2017-08-19 DIAGNOSIS — R10816 Epigastric abdominal tenderness: Secondary | ICD-10-CM

## 2017-08-19 DIAGNOSIS — R1013 Epigastric pain: Secondary | ICD-10-CM

## 2017-08-19 DIAGNOSIS — R140 Abdominal distension (gaseous): Secondary | ICD-10-CM

## 2017-08-19 DIAGNOSIS — R109 Unspecified abdominal pain: Secondary | ICD-10-CM | POA: Diagnosis not present

## 2017-08-19 MED ORDER — SODIUM CHLORIDE 0.9 % IV SOLN: 500.0000 mL | Freq: Once | INTRAVENOUS | Status: DC

## 2017-08-19 NOTE — Progress Notes (Signed)
To PACU, VSS. Report to [email or web address] 

## 2017-08-19 NOTE — Patient Instructions (Addendum)
There was food in the stomach.  You need to stop the Trulicity and see Dr. [place] Crane. Trulicity slows stomach emptying on purpose but I think it may be too much.  I will send Dr. [place] Crane a letter explaining what I think.  Will arrange f/u after that.  I appreciate the opportunity to care for you. Jennifer Mayer, MD, FACG YOU HAD AN ENDOSCOPIC PROCEDURE TODAY AT [facility]:   Refer to the procedure report that was given to you for any specific questions about what was found during the examination.  If the procedure report does not answer your questions, please call your gastroenterologist to clarify.  If you requested that your care partner not be given the details of your procedure findings, then the procedure report has been included in a sealed envelope for you to review at your convenience later.  YOU SHOULD EXPECT: Some feelings of bloating in the abdomen. Passage of more gas than usual.  Walking can help get rid of the air that was put into your GI tract during the procedure and reduce the bloating. If you had a lower endoscopy (such as a colonoscopy or flexible sigmoidoscopy) you may notice spotting of blood in your stool or on the toilet paper. If you underwent a bowel prep for your procedure, you may not have a normal bowel movement for a few days.  Please Note:  You might notice some irritation and congestion in your nose or some drainage.  This is from the oxygen used during your procedure.  There is no need for concern and it should clear up in a day or so.  SYMPTOMS TO REPORT IMMEDIATELY:   Following upper endoscopy (EGD)  Vomiting of blood or coffee ground material  New chest pain or pain under the shoulder blades  Painful or persistently difficult swallowing  New shortness of breath  Fever of 100F or higher  Black, tarry-looking stools  For urgent or emergent issues, a gastroenterologist can be reached at any hour by calling [phone].   DIET:   We do recommend a small meal at first, but then you may proceed to your regular diet.  Drink plenty of fluids but you should avoid alcoholic beverages for 24 hours. Follow a Gastroparesis diet.  MEDICATIONS: Continue present medications. However, Stop Trulicity. This medication delays gastric emptying and Dr. Carlean Purl thinks this may be at least part of the source of your problems.  Please see handouts given to you by your recovery nurse.  Patient given note for her to order food off of the kids menu to take home due to her gastroparesis.  FOLLOW-UP: See your primary care provider for follow-up and then arrange f/u with Dr. Carlean Purl after that.  ACTIVITY:  You should plan to take it easy for the rest of today and you should NOT DRIVE or use heavy machinery until tomorrow (because of the sedation medicines used during the test).    FOLLOW UP: Our staff will call the number listed on your records the next business day following your procedure to check on you and address any questions or concerns that you may have regarding the information given to you following your procedure. If we do not reach you, we will leave a message.  However, if you are feeling well and you are not experiencing any problems, there is no need to return our call.  We will assume that you have returned to your regular daily activities without incident.  If any  biopsies were taken you will be contacted by phone or by letter within the next 1-3 weeks.  Please call us at [phone] if you have not heard about the biopsies in 3 weeks.   Thank you for allowing [place] to provide for your healthcare needs today.   SIGNATURES/CONFIDENTIALITY: You and/or your care partner have signed paperwork which will be entered into your electronic medical record.  These signatures attest to the fact that that the information above on your After Visit Summary has been reviewed and is understood.  Full responsibility of the confidentiality of this  discharge information lies with you and/or your care-partner.

## 2017-08-19 NOTE — Op Note (Signed)
[place] Patient Name: [name] Procedure Date: 08/19/2017 7:36 AM MRN: [id number] Endoscopist: Gatha Mayer , MD Age: 62 Referring MD:  Date of Birth: [birth year] Gender: Female Account #: [id number] Procedure:                Upper GI endoscopy Indications:              Epigastric abdominal pain Medicines:                Propofol per Anesthesia, Monitored Anesthesia Care Procedure:                Pre-Anesthesia Assessment:                           - Prior to the procedure, a History and Physical                            was performed, and patient medications and                            allergies were reviewed. The patient's tolerance of                            previous anesthesia was also reviewed. The risks                            and benefits of the procedure and the sedation                            options and risks were discussed with the patient.                            All questions were answered, and informed consent                            was obtained. Prior Anticoagulants: The patient                            last took Plavix (clopidogrel) 2 days prior to the                            procedure. ASA Grade Assessment: III - A patient                            with severe systemic disease. After reviewing the                            risks and benefits, the patient was deemed in                            satisfactory condition to undergo the procedure.                           After obtaining informed consent, the endoscope was  passed under direct vision. Throughout the                            procedure, the patient's blood pressure, pulse, and                            oxygen saturations were monitored continuously. The                            Endoscope was introduced through the mouth, and                            advanced to the second part of duodenum. The upper   GI endoscopy was accomplished without difficulty.                            The patient tolerated the procedure well. Scope In: Scope Out: Findings:                 A large amount of food (residue) was found in the                            gastric body, on the greater curvature of the                            stomach and in the gastric antrum.                           Patchy moderately erythematous mucosa without                            bleeding was found in the gastric antrum.                           The exam was otherwise without abnormality.                           The cardia and gastric fundus were otherwise normal                            on retroflexion. Complications:            No immediate complications. Estimated Blood Loss:     Estimated blood loss: none. Impression:               - A large amount of food (residue) in the stomach.                           - Erythematous mucosa in the antrum.                           - The examination was otherwise normal.                           - No specimens collected. Recommendation:           -  Patient has a contact number available for                            emergencies. The signs and symptoms of potential                            delayed complications were discussed with the                            patient. Return to normal activities tomorrow.                            Written discharge instructions were provided to the                            patient.                           - Gastroparesis diet.                           - Continue present medications.                           - I started reglan in ioffice - she is on Trulicity                            which delays gastric emptying and I think may be at                            least part of the source of problems.                           Has worsening kidney fx and hyperglycemia.                           Stop Trulicity                            see PCP - I will arrange f/u me after that Gatha Mayer, MD 08/19/2017 8:03:48 AM This report has been signed electronically.

## 2017-08-19 NOTE — Telephone Encounter (Signed)
Patient notified of CT scheduled for 08/26/17 8:45.  She understands to come pick up oral contrast she will still require this for the CT.

## 2017-08-19 NOTE — Progress Notes (Signed)
Pt's states no medical or surgical changes since previsit or office visit. 

## 2017-08-19 NOTE — Telephone Encounter (Signed)
-----   Message from Gatha Mayer, MD sent at 08/19/2017 12:57 PM EDT ----- Regarding: needs CT Please schedule CT abdomen/pelvis without IV contrast re:  Epigastric pain and tenderness and bloating with history of pancreatitis

## 2017-08-20 ENCOUNTER — Telehealth: Payer: Self-pay | Admitting: *Deleted

## 2017-08-20 NOTE — Telephone Encounter (Signed)
  Follow up Call-  Call back number 08/19/2017  Post procedure Call Back phone  # [phone]  Permission to leave phone message Yes  Some recent data might be hidden     Patient questions:  Message left to call us if necessary.  Second call.

## 2017-08-20 NOTE — Telephone Encounter (Signed)
  Follow up Call-  Call back number 08/19/2017  Post procedure Call Back phone  # [phone]  Permission to leave phone message Yes  Some recent data might be hidden     Patient questions:  Message left to call us if necessary.

## 2017-08-25 DIAGNOSIS — K219 Gastro-esophageal reflux disease without esophagitis: Secondary | ICD-10-CM | POA: Diagnosis not present

## 2017-08-25 DIAGNOSIS — E1121 Type 2 diabetes mellitus with diabetic nephropathy: Secondary | ICD-10-CM | POA: Diagnosis not present

## 2017-08-26 ENCOUNTER — Ambulatory Visit (INDEPENDENT_AMBULATORY_CARE_PROVIDER_SITE_OTHER)
Admission: RE | Admit: 2017-08-26 | Discharge: 2017-08-26 | Disposition: A | Discharge status: Home / Self Care | Payer: Medicare Other | Source: Ambulatory Visit | Attending: Internal Medicine | Admitting: Internal Medicine

## 2017-08-26 DIAGNOSIS — R10816 Epigastric abdominal tenderness: Secondary | ICD-10-CM

## 2017-08-26 DIAGNOSIS — Z8719 Personal history of other diseases of the digestive system: Secondary | ICD-10-CM

## 2017-08-26 DIAGNOSIS — R1013 Epigastric pain: Secondary | ICD-10-CM

## 2017-08-26 DIAGNOSIS — R140 Abdominal distension (gaseous): Secondary | ICD-10-CM

## 2017-08-28 NOTE — Progress Notes (Signed)
CT scan negative for a cause of her problems Please see how she is doing off Trulicity and has she seen Dr. [place] Crane PCP

## 2017-09-08 DIAGNOSIS — K219 Gastro-esophageal reflux disease without esophagitis: Secondary | ICD-10-CM | POA: Diagnosis not present

## 2017-09-08 DIAGNOSIS — E1140 Type 2 diabetes mellitus with diabetic neuropathy, unspecified: Secondary | ICD-10-CM | POA: Diagnosis not present

## 2017-09-08 DIAGNOSIS — K5900 Constipation, unspecified: Secondary | ICD-10-CM | POA: Diagnosis not present

## 2017-09-08 DIAGNOSIS — Z6824 Body mass index (BMI) 24.0-24.9, adult: Secondary | ICD-10-CM | POA: Diagnosis not present

## 2017-09-11 ENCOUNTER — Ambulatory Visit: Payer: Medicare Other | Admitting: Internal Medicine

## 2017-09-18 ENCOUNTER — Encounter: Payer: Self-pay | Admitting: Internal Medicine

## 2017-09-18 ENCOUNTER — Ambulatory Visit (INDEPENDENT_AMBULATORY_CARE_PROVIDER_SITE_OTHER): Payer: Medicare Other | Admitting: Internal Medicine

## 2017-09-18 VITALS — BP 140/80 | HR 92 | Ht 63.0 in | Wt 148.1 lb

## 2017-09-18 DIAGNOSIS — K3184 Gastroparesis: Secondary | ICD-10-CM

## 2017-09-18 DIAGNOSIS — K581 Irritable bowel syndrome with constipation: Secondary | ICD-10-CM

## 2017-09-18 DIAGNOSIS — Z9889 Other specified postprocedural states: Secondary | ICD-10-CM

## 2017-09-18 DIAGNOSIS — E1143 Type 2 diabetes mellitus with diabetic autonomic (poly)neuropathy: Secondary | ICD-10-CM | POA: Diagnosis not present

## 2017-09-18 MED ORDER — METOCLOPRAMIDE HCL 10 MG PO TABS: 10.0000 mg | ORAL_TABLET | Freq: Three times a day (TID) | ORAL | 3 refills | Status: DC

## 2017-09-18 NOTE — Patient Instructions (Addendum)
Continue to work on your blood sugar.   We have sent the following medications to your pharmacy for you to pick up at your convenience:   Reglan 10 mg four times a day before meals and at bedtime.   We have given you gastroparesis diet handout, use Step #3  We will see you at your July appointment.   I appreciate the opportunity to care for you. Silvano Rusk, MD, [facility]

## 2017-09-18 NOTE — Progress Notes (Signed)
[name] 61 y.o. 1956/03/12 [id number]  Assessment & Plan:   Encounter Diagnoses  Name Primary?  . Diabetic gastroparesis associated with type 2 diabetes mellitus ([place]) Yes  . S/P Nissen fundoplication (without gastrostomy tube) procedure   . Irritable bowel syndrome with constipation     It is looking like she has gastroparesis from diabetes exacerbated by hyperglycemia and type 2 diabetes mellitus out of control at this time.  I think her symptoms are affected by the fundoplication as well creating more pressure etc.  She cannot vomit and she probably has a hard time releasing gas pressure etc.  She does belch some.  I still think being off Trulicity makes sense because that does delay gastric emptying.  She needs to work on improving her hyperglycemia as she is through primary care.  Gastroparesis diet step 3 handout given to the patient.  Increase Reglan to 10 mg 4 times daily (3 times daily AC and at bedtime).  Attentional side effects of tardive dyskinesia and neuropathy explained.  Hopefully she will not need high doses frequently on a chronic basis.  If her hyperglycemia gets better that may allow for reduction in dosing.  I think that the prucalopride i.e. Motegr is a good drug for herity pharmacologically and might actually help her stomach empty to some perhaps, but unfortunately I doubt the be good coverage for that as she is on Medicare.  I.e. cost prohibitive at this time.  RTC July  I appreciate the opportunity to care for this patient. CC: Jennifer Squibb, MD   Subjective:   Chief Complaint: Nausea heartburn epigastric discomfort  HPI [name] returns for follow-up today, she has had a recurrence of epigastric pain that radiates up into the chest and even to the suprasternal area.  CT scanning was unrevealing I did an EGD that demonstrated retained food and intact Nissen fundoplication.  That was last month.  She is having these persistent epigastric pressure  symptoms that are quite bothersome she is not vomiting but she belches.  A lot of nausea.  Sleep is disturbed as well I thought Trulicity might be part of the problem since that delays gastric emptying so that was stopped and she actually has been on metoclopramide 5 mg before meals and at bedtime which is helping some she says but not completely.  Continues with hyperglycemia problems.  Working on new insulin etc. with primary care to try to get her blood sugars under better control.  Wt Readings from Last 3 Encounters:  09/18/17 148 lb 2 oz (67.2 kg)  08/19/17 148 lb (67.1 kg)  08/05/17 148 lb (67.1 kg)    Has chronic constipation/IBS as well is on some samples of [place] gritty which are helping.  Allergies  Allergen Reactions  . Sulfa Antibiotics Shortness Of Breath and Palpitations  . 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 Fish Allergy Hives and Swelling    Tongue swelling  . Iodine Swelling  . Metformin And Related Diarrhea  . Adhesive [Tape] Rash    Paper tape is ok  . Shellfish Allergy Swelling and Rash    Tongue swelling   Current Meds  Medication Sig  . albuterol (PROAIR HFA) 108 (90 Base) MCG/ACT inhaler 1-2 inhalations every 4-6 hours as needed for cough or wheeze. (Patient taking differently: Inhale 1-2 puffs into the lungs every 4 (four) hours as needed for shortness of breath. )  . ALPRAZolam (XANAX) 0.5  MG tablet Take 0.25 mg by mouth daily as needed for anxiety or sleep.   Marland Kitchen amLODipine (NORVASC) 5 MG tablet Take 1 tablet (5 mg total) by mouth daily.  Marland Kitchen aspirin EC 81 MG tablet Take 81 mg by mouth daily.  . Azelastine HCl 0.15 % SOLN Place 2 sprays into both nostrils 2 (two) times daily. (Patient taking differently: Place 2 sprays into both nostrils 2 (two) times daily as needed (allergies). )  . citalopram (CELEXA) 10 MG tablet Take 10 mg by mouth daily.  . clopidogrel (PLAVIX) 75 MG tablet Take 1 tablet (75 mg total) by  mouth daily with supper.  . cyclobenzaprine (FLEXERIL) 10 MG tablet Take 10 mg by mouth 3 (three) times daily as needed for muscle spasms.  Marland Kitchen dicyclomine (BENTYL) 20 MG tablet Take 1 tablet (20 mg total) by mouth 3 (three) times daily as needed for spasms.  . Fenofibric Acid 105 MG TABS Take 1 tablet by mouth daily.  Marland Kitchen HYDROcodone-acetaminophen (NORCO/VICODIN) 5-325 MG tablet Take 1 tablet by mouth 2 (two) times daily as needed. for pain  . insulin aspart (NOVOLOG) 100 UNIT/ML injection Inject 5-12 Units into the skin 2 (two) times daily before a meal. Per sliding scale  . Insulin Glargine-Lixisenatide (SOLIQUA) 100-33 UNT-MCG/ML SOPN Inject into the skin. Sliding scale  . isosorbide mononitrate (IMDUR) 30 MG 24 hr tablet Take 1 tablet (30 mg total) by mouth daily.  Marland Kitchen loratadine (CLARITIN) 10 MG tablet Take 10 mg by mouth daily as needed for allergies.  Marland Kitchen meclizine (ANTIVERT) 25 MG tablet Take 1 tablet by mouth 3 (three) times daily as needed for dizziness.  . mupirocin cream (BACTROBAN) 2 % Apply 1 application topically 2 (two) times daily as needed (wound care (boils)).   . pantoprazole (PROTONIX) 40 MG tablet TAKE 1 TABLET BY MOUTH EVERY DAY  . propranolol ER (INDERAL LA) 60 MG 24 hr capsule Take 1 capsule (60 mg total) by mouth every evening.  . Prucalopride Succinate (MOTEGRITY) 2 MG TABS Take 1 tablet by mouth every other day.  . rosuvastatin (CRESTOR) 40 MG tablet Take 40 mg by mouth daily.  . SUMAtriptan (IMITREX) 50 MG tablet Take 50 mg by mouth every 2 (two) hours as needed for migraine.  . temazepam (RESTORIL) 30 MG capsule Take 30 mg by mouth at bedtime.  . [DISCONTINUED] metoCLOPramide (REGLAN) 5 MG tablet Take 1 tablet (5 mg total) by mouth 4 (four) times daily -  before meals and at bedtime.   Past Medical History:  Diagnosis Date  . 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 Anemia    hx  . Angioedema 07/10/2015  . Anxiety   . Arthritis    "neck, left hand" (09/14/2012)  . Asthma   .  Cancer ([place]) 1985   ovarian, no treatment except surgery  . Complication of anesthesia    OCCASIONAL TROUBLE TURNING NECK TO RIGHT  . Critical lower limb ischemia    10/2014 s/p L SFA stenting  . Diverticulosis of colon with hemorrhage April 2013  . GERD (gastroesophageal reflux disease)   . H/O hiatal hernia   . Heart attack [facility])    2003 mild MI, March 2013 mild MI  . Hidradenitis    groin  . History of blood transfusion 1985 AND 2013  . Hyperlipidemia   . Hypertension   . Irritable bowel syndrome   . Left-sided weakness    since stroke, left eye trouble seeing  . Migraines   . Mild CAD  a. Cath 09/2010: mild luminal irregularities of LAD, 30% prox RCA and 20-30% mRCA, EF 65%.  . Neuropathy   . Obesity   . PAD (peripheral artery disease) ([place])    a. critical limb ischemia s/p PTA/stenting of L SFA 10/2014. c. occ prior SFA stent by angio 01/2016, for possible PV bypass.  . Pneumonia    baby  . Recurrent upper respiratory infection (URI)   . S/P arterial stent-mid Lt SFA 11/03/14 11/04/2014  . Schatzki's ring   . Sinus problem   . Stomach ulcer 1972   non-bleeding  . Stroke ([place]) 07-2007, 07-2008, 07-2009   total 3 strokes; mild left sided weakness and left eye "jumps".  . Type II diabetes mellitus ([place])   . Vitamin B 12 deficiency 07-15-2013   Past Surgical History:  Procedure Laterality Date  . ABDOMINAL HYSTERECTOMY  1985  . ADENOIDECTOMY    . ANTERIOR CERVICAL DECOMP/DISCECTOMY FUSION  2002  . ANTERIOR CERVICAL DECOMP/DISCECTOMY FUSION N/A 04/08/2014   Procedure: Cervical Six-Seven ANTERIOR CERVICAL DECOMPRESSION/DISCECTOMY FUSION Plating and Bonegraft  2 LEVELS;  Surgeon: Ashok Pall, MD;  Location: [place] NEURO ORS;  Service: Neurosurgery;  Laterality: N/A;  Cervical Six-Seven ANTERIOR CERVICAL DECOMPRESSION/DISCECTOMY FUSION Plating and Bonegraft  2 LEVELS  . APPENDECTOMY  1985  . AXILLARY HIDRADENITIS EXCISION  1990-2008   bilateral  . BACK SURGERY    . BREAST BIOPSY  Right 2007  . BREAST CYST EXCISION Right 2008  . BREAST REDUCTION SURGERY    . CARDIAC CATHETERIZATION  2004   mild disease  . CATARACT EXTRACTION W/PHACO Left 05/16/2015   Procedure: CATARACT EXTRACTION PHACO AND INTRAOCULAR LENS PLACEMENT (IOC);  Surgeon: Rutherford Guys, MD;  Location: [facility];  Service: Ophthalmology;  Laterality: Left;  CDE: 4.24  . CATARACT EXTRACTION W/PHACO Right 05/30/2015   Procedure: CATARACT EXTRACTION RIGHT EYE PHACO AND INTRAOCULAR LENS PLACEMENT ;  Surgeon: Rutherford Guys, MD;  Location: [facility];  Service: Ophthalmology;  Laterality: Right;  CDE:4.08  . CHOLECYSTECTOMY  1990's  . COLONOSCOPY  08/12/2011   Procedure: COLONOSCOPY;  Surgeon: Ladene Artist, MD,FACG;  Location: [facility] ENDOSCOPY;  Service: Endoscopy;  Laterality: N/A;  . COLONOSCOPY  06/19/2006  . cyst thigh Right   . ESOPHAGOGASTRODUODENOSCOPY  08/12/2011   Procedure: ESOPHAGOGASTRODUODENOSCOPY (EGD);  Surgeon: Ladene Artist, MD,FACG;  Location[facility] ENDOSCOPY;  Service: Endoscopy;  Laterality: N/A;  . ESOPHAGOGASTRODUODENOSCOPY  06/04/2005  . FEMORAL-POPLITEAL BYPASS GRAFT Left 06/19/2016   Procedure: Left Leg BYPASS GRAFT FEMORAL-POPLITEAL ARTERY;  Surgeon: Serafina Mitchell, MD;  Location: [place];  Service: Vascular;  Laterality: Left;  . GIVENS CAPSULE STUDY  08/13/2011   Procedure: GIVENS CAPSULE STUDY;  Surgeon: Ladene Artist, MD,FACG;  Location: [facility] ENDOSCOPY;  Service: Endoscopy;  Laterality: N/A;  . HAMMER TOE SURGERY Bilateral ~ 2000  . HIATAL HERNIA REPAIR    . HYDRADENITIS EXCISION  01/2011; 03/2012   'groin and abdomen; 03/2012" (09/14/2012)  . HYDRADENITIS EXCISION  04/01/2012   Procedure: EXCISION HYDRADENITIS GROIN;  Surgeon: Pedro Earls, MD;  Location: [facility] ORS;  Service: General;  Laterality: Bilateral;  Excision of Hydradenitis of Perineum  . HYDRADENITIS EXCISION N/A 09/17/2013   Procedure: EXCISION PERINEAL HIDRADENITIS ;  Surgeon: Pedro Earls, MD;  Location: [facility]S;  Service: General;   Laterality: N/A;  also in the pubis area  . LEFT HEART CATH AND CORONARY ANGIOGRAPHY N/A 12/26/2016   Procedure: LEFT HEART CATH AND CORONARY ANGIOGRAPHY;  Surgeon: [name], Peter M, MD;  Location: [facility] INVASIVE CV  LAB;  Service: Cardiovascular;  Laterality: N/A;  . MASS EXCISION Right 09/17/2013   Procedure: EXCISION MASS;  Surgeon: Pedro Earls, MD;  Location: [facility] ORS;  Service: General;  Laterality: Right;  . NISSEN FUNDOPLICATION  5993  . PERIPHERAL VASCULAR CATHETERIZATION N/A 11/03/2014   Procedure: Lower Extremity Angiography;  Surgeon: Lorretta Harp, MD;  Location: [place] CV LAB;  Service: Cardiovascular;  Laterality: N/A;  . PERIPHERAL VASCULAR CATHETERIZATION N/A 01/29/2016   Procedure: Lower Extremity Angiography;  Surgeon: Lorretta Harp, MD;  Location: [place] CV LAB;  Service: Cardiovascular;  Laterality: N/A;  . POSTERIOR LUMBAR FUSION  2008 X 2  . REDUCTION MAMMAPLASTY  1996?  . TONSILLECTOMY AND ADENOIDECTOMY  1959 AND 2000  . 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  Divorced, living alone in [place].     family history includes Allergic rhinitis in her sister; Aneurysm in her sister; Breast cancer in her mother; Diabetes in her father and sister; Emphysema in her unknown relative; Heart disease in her father, mother, and sister; Hyperlipidemia in her sister; Hypertension in her mother, sister, and sister; Stroke in her father.   Review of Systems As per HPI Weight is stable Appetite is off Objective:   Physical Exam BP 140/80 (BP Location: Left Arm, Patient Position: Sitting, Cuff Size: Normal)   Pulse 92   Ht 5\' 3"  (1.6 m)   Wt 148 lb 2 oz (67.2 kg)   BMI 26.24 kg/m  No acute distress

## 2017-09-22 DIAGNOSIS — Z6824 Body mass index (BMI) 24.0-24.9, adult: Secondary | ICD-10-CM | POA: Diagnosis not present

## 2017-09-22 DIAGNOSIS — E1140 Type 2 diabetes mellitus with diabetic neuropathy, unspecified: Secondary | ICD-10-CM | POA: Diagnosis not present

## 2017-09-25 DIAGNOSIS — T783XXA Angioneurotic edema, initial encounter: Secondary | ICD-10-CM | POA: Diagnosis not present

## 2017-09-25 DIAGNOSIS — R21 Rash and other nonspecific skin eruption: Secondary | ICD-10-CM | POA: Diagnosis not present

## 2017-09-25 DIAGNOSIS — E1140 Type 2 diabetes mellitus with diabetic neuropathy, unspecified: Secondary | ICD-10-CM | POA: Diagnosis not present

## 2017-09-26 DIAGNOSIS — E119 Type 2 diabetes mellitus without complications: Secondary | ICD-10-CM | POA: Diagnosis not present

## 2017-10-06 ENCOUNTER — Ambulatory Visit (INDEPENDENT_AMBULATORY_CARE_PROVIDER_SITE_OTHER): Payer: Medicare Other | Admitting: Family

## 2017-10-06 ENCOUNTER — Encounter: Payer: Self-pay | Admitting: Family

## 2017-10-06 ENCOUNTER — Ambulatory Visit (HOSPITAL_COMMUNITY)
Admission: RE | Admit: 2017-10-06 | Discharge: 2017-10-06 | Disposition: A | Discharge status: Home / Self Care | Payer: Medicare Other | Source: Ambulatory Visit | Attending: Surgery | Admitting: Surgery

## 2017-10-06 ENCOUNTER — Ambulatory Visit (INDEPENDENT_AMBULATORY_CARE_PROVIDER_SITE_OTHER)
Admission: RE | Admit: 2017-10-06 | Discharge: 2017-10-06 | Disposition: A | Discharge status: Home / Self Care | Payer: Medicare Other | Source: Ambulatory Visit | Attending: Surgery | Admitting: Surgery

## 2017-10-06 VITALS — BP 110/65 | HR 62 | Temp 97.4°F | Resp 16 | Ht 63.0 in | Wt 145.0 lb

## 2017-10-06 DIAGNOSIS — I779 Disorder of arteries and arterioles, unspecified: Secondary | ICD-10-CM | POA: Diagnosis not present

## 2017-10-06 DIAGNOSIS — N183 Chronic kidney disease, stage 3 unspecified: Secondary | ICD-10-CM

## 2017-10-06 DIAGNOSIS — F17200 Nicotine dependence, unspecified, uncomplicated: Secondary | ICD-10-CM

## 2017-10-06 DIAGNOSIS — Z95828 Presence of other vascular implants and grafts: Secondary | ICD-10-CM

## 2017-10-06 DIAGNOSIS — I739 Peripheral vascular disease, unspecified: Secondary | ICD-10-CM | POA: Diagnosis not present

## 2017-10-06 NOTE — Patient Instructions (Addendum)
Steps to Quit Smoking Smoking tobacco can be bad for your health. It can also affect almost every organ in your body. Smoking puts you and people around you at risk for many serious long-lasting (chronic) diseases. Quitting smoking is hard, but it is one of the best things that you can do for your health. It is never too late to quit. What are the benefits of quitting smoking? When you quit smoking, you lower your risk for getting serious diseases and conditions. They can include:  Lung cancer or lung disease.  Heart disease.  Stroke.  Heart attack.  Not being able to have children (infertility).  Weak bones (osteoporosis) and broken bones (fractures).  If you have coughing, wheezing, and shortness of breath, those symptoms may get better when you quit. You may also get sick less often. If you are pregnant, quitting smoking can help to lower your chances of having a baby of low birth weight. What can I do to help me quit smoking? Talk with your doctor about what can help you quit smoking. Some things you can do (strategies) include:  Quitting smoking totally, instead of slowly cutting back how much you smoke over a period of time.  Going to in-person counseling. You are more likely to quit if you go to many counseling sessions.  Using resources and support systems, such as: ? Online chats with a counselor. ? Phone quitlines. ? Printed self-help materials. ? Support groups or group counseling. ? Text messaging programs. ? Mobile phone apps or applications.  Taking medicines. Some of these medicines may have nicotine in them. If you are pregnant or breastfeeding, do not take any medicines to quit smoking unless your doctor says it is okay. Talk with your doctor about counseling or other things that can help you.  Talk with your doctor about using more than one strategy at the same time, such as taking medicines while you are also going to in-person counseling. This can help make  quitting easier. What things can I do to make it easier to quit? Quitting smoking might feel very hard at first, but there is a lot that you can do to make it easier. Take these steps:  Talk to your family and friends. Ask them to support and encourage you.  Call phone quitlines, reach out to support groups, or work with a counselor.  Ask people who smoke to not smoke around you.  Avoid places that make you want (trigger) to smoke, such as: ? Bars. ? Parties. ? Smoke-break areas at work.  Spend time with people who do not smoke.  Lower the stress in your life. Stress can make you want to smoke. Try these things to help your stress: ? Getting regular exercise. ? Deep-breathing exercises. ? Yoga. ? Meditating. ? Doing a body scan. To do this, close your eyes, focus on one area of your body at a time from head to toe, and notice which parts of your body are tense. Try to relax the muscles in those areas.  Download or buy apps on your mobile phone or tablet that can help you stick to your quit plan. There are many free apps, such as QuitGuide from the CDC (Centers for Disease Control and Prevention). You can find more support from smokefree.gov and other websites.  This information is not intended to replace advice given to you by your health care provider. Make sure you discuss any questions you have with your health care provider. Document Released: 02/16/2009 Document   Revised: 12/19/2015 Document Reviewed: 09/06/2014 Elsevier Interactive Patient Education  2018 Elsevier Inc.     Peripheral Vascular Disease Peripheral vascular disease (PVD) is a disease of the blood vessels that are not part of your heart and brain. A simple term for PVD is poor circulation. In most cases, PVD narrows the blood vessels that carry blood from your heart to the rest of your body. This can result in a decreased supply of blood to your arms, legs, and internal organs, like your stomach or kidneys.  However, it most often affects a person's lower legs and feet. There are two types of PVD.  Organic PVD. This is the more common type. It is caused by damage to the structure of blood vessels.  Functional PVD. This is caused by conditions that make blood vessels contract and tighten (spasm).  Without treatment, PVD tends to get worse over time. PVD can also lead to acute ischemic limb. This is when an arm or limb suddenly has trouble getting enough blood. This is a medical emergency. Follow these instructions at home:  Take medicines only as told by your doctor.  Do not use any tobacco products, including cigarettes, chewing tobacco, or electronic cigarettes. If you need help quitting, ask your doctor.  Lose weight if you are overweight, and maintain a healthy weight as told by your doctor.  Eat a diet that is low in fat and cholesterol. If you need help, ask your doctor.  Exercise regularly. Ask your doctor for some good activities for you.  Take good care of your feet. ? Wear comfortable shoes that fit well. ? Check your feet often for any cuts or sores. Contact a doctor if:  You have cramps in your legs while walking.  You have leg pain when you are at rest.  You have coldness in a leg or foot.  Your skin changes.  You are unable to get or have an erection (erectile dysfunction).  You have cuts or sores on your feet that are not healing. Get help right away if:  Your arm or leg turns cold and blue.  Your arms or legs become red, warm, swollen, painful, or numb.  You have chest pain or trouble breathing.  You suddenly have weakness in your face, arm, or leg.  You become very confused or you cannot speak.  You suddenly have a very bad headache.  You suddenly cannot see. This information is not intended to replace advice given to you by your health care provider. Make sure you discuss any questions you have with your health care provider. Document Released:  07/17/2009 Document Revised: 09/28/2015 Document Reviewed: 09/30/2013 Elsevier Interactive Patient Education  2017 Elsevier Inc.  

## 2017-10-06 NOTE — Progress Notes (Signed)
[facility] [place]   CC: Follow up peripheral artery occlusive disease  History of Present Illness [name] is a 62 y.o. female who is status post left femoral to popliteal artery bypass graft with PTFE on 06/09/2016 by Dr. Trula Slade for lifestyle limiting claudication. She had previously undergone atherectomy and angioplasty by Dr. Gwenlyn Found.  Dr. Trula Slade last evaluated pt on 04-02-18. At that time duplex showed a left ABI of .95 and no stenosis within the graft S/P left fem AK pop BPG with PTFE.  She was asx.  Duplex showed BPG to be widely patent. Dr. Trula Slade indicated that he will need to monitor the velocities within the graft as they are on the low side.  She was to follow up in 6 months with a duplex.  She does not seem to indicate claudication sx's with walking, c/o bilateral hip pain, right worse than left; states she still has some mild numbness in her left calf.  She denies sores or non healing wounds.   She has had 3 strokes, that last one in 2011, indicates that she has mild hemiparesis and hemiplegia in her left upper and lower extremities.   Serum creatinine was 2.08 on 08-05-17, GFR 31, stage 3b CKD.    Diabetic: Yes, last A1C result on file was 7.3 on 06-19-16; however, pt states her blood sugars have been in the 300's lately, and is working with her PCP re this.  Tobacco use: smoker  (1/2 ppd x 41 yrs); she had quit for a year, resumed in January 2019 which she relates to the death of a couple of family members and other stress.   Pt meds include: Statin :Yes Betablocker: no ASA: Yes Other anticoagulants/antiplatelets: Plavix  Past Medical History:  Diagnosis Date  . 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 Anemia    hx  . Angioedema 07/10/2015  . Anxiety   . Arthritis    "neck, left hand" (09/14/2012)  . Asthma   . Cancer ([place]) 1985   ovarian, no treatment except surgery  . Complication of anesthesia    OCCASIONAL TROUBLE TURNING NECK TO RIGHT  .  Critical lower limb ischemia    10/2014 s/p L SFA stenting  . Diverticulosis of colon with hemorrhage April 2013  . GERD (gastroesophageal reflux disease)   . H/O hiatal hernia   . Heart attack [facility])    2003 mild MI, March 2013 mild MI  . Hidradenitis    groin  . History of blood transfusion 1985 AND 2013  . Hyperlipidemia   . Hypertension   . Irritable bowel syndrome   . Left-sided weakness    since stroke, left eye trouble seeing  . Migraines   . Mild CAD    a. Cath 09/2010: mild luminal irregularities of LAD, 30% prox RCA and 20-30% mRCA, EF 65%.  . Neuropathy   . Obesity   . PAD (peripheral artery disease) ([place])    a. critical limb ischemia s/p PTA/stenting of L SFA 10/2014. c. occ prior SFA stent by angio 01/2016, for possible PV bypass.  . Pneumonia    baby  . Recurrent upper respiratory infection (URI)   . S/P arterial stent-mid Lt SFA 11/03/14 11/04/2014  . Schatzki's ring   . Sinus problem   . Stomach ulcer 1972   non-bleeding  . Stroke ([place]) 07-2007, 07-2008, 07-2009   total 3 strokes; mild left sided weakness and left eye "jumps".  . Type II diabetes mellitus ([place])   . Vitamin  B 12 deficiency 07-15-2013    Social History Social History   Tobacco Use  . Smoking status: Former Smoker    Packs/day: 0.33    Years: 41.00    Pack years: 13.53    Types: Cigarettes    Last attempt to quit: 01/15/2016    Years since quitting: 1.7  . Smokeless tobacco: Never Used  . Tobacco comment: Getting ready to start nicotine patches RX by Dr. Gwenlyn Found per pt.  Substance Use Topics  . Alcohol use: No    Alcohol/week: 0.0 oz  . Drug use: No    Types: "Crack" cocaine    Comment: 05/09/2015.  "quit 07/27/1994"    Family History Family History  Problem Relation Age of Onset  . Breast cancer Mother   . Heart disease Mother   . Hypertension Mother   . Diabetes Father   . Heart disease Father   . Stroke Father   . Heart disease Sister   . Hypertension Sister   . Hypertension Sister    . Hyperlipidemia Sister   . Diabetes Sister   . Allergic rhinitis Sister   . Emphysema Unknown        great uncle  . Aneurysm Sister        brain  . Angioedema Neg Hx   . Asthma Neg Hx   . Eczema Neg Hx   . Immunodeficiency Neg Hx   . Urticaria Neg Hx     Past Surgical History:  Procedure Laterality Date  . ABDOMINAL HYSTERECTOMY  1985  . ADENOIDECTOMY    . ANTERIOR CERVICAL DECOMP/DISCECTOMY FUSION  2002  . ANTERIOR CERVICAL DECOMP/DISCECTOMY FUSION N/A 04/08/2014   Procedure: Cervical Six-Seven ANTERIOR CERVICAL DECOMPRESSION/DISCECTOMY FUSION Plating and Bonegraft  2 LEVELS;  Surgeon: Ashok Pall, MD;  Location: [place] NEURO ORS;  Service: Neurosurgery;  Laterality: N/A;  Cervical Six-Seven ANTERIOR CERVICAL DECOMPRESSION/DISCECTOMY FUSION Plating and Bonegraft  2 LEVELS  . APPENDECTOMY  1985  . AXILLARY HIDRADENITIS EXCISION  1990-2008   bilateral  . BACK SURGERY    . BREAST BIOPSY Right 2007  . BREAST CYST EXCISION Right 2008  . BREAST REDUCTION SURGERY    . CARDIAC CATHETERIZATION  2004   mild disease  . CATARACT EXTRACTION W/PHACO Left 05/16/2015   Procedure: CATARACT EXTRACTION PHACO AND INTRAOCULAR LENS PLACEMENT (IOC);  Surgeon: Rutherford Guys, MD;  Location: [facility];  Service: Ophthalmology;  Laterality: Left;  CDE: 4.24  . CATARACT EXTRACTION W/PHACO Right 05/30/2015   Procedure: CATARACT EXTRACTION RIGHT EYE PHACO AND INTRAOCULAR LENS PLACEMENT ;  Surgeon: Rutherford Guys, MD;  Location: [facility];  Service: Ophthalmology;  Laterality: Right;  CDE:4.08  . CHOLECYSTECTOMY  1990's  . COLONOSCOPY  08/12/2011   Procedure: COLONOSCOPY;  Surgeon: Ladene Artist, MD,FACG;  Location: [facility] ENDOSCOPY;  Service: Endoscopy;  Laterality: N/A;  . COLONOSCOPY  06/19/2006  . cyst thigh Right   . ESOPHAGOGASTRODUODENOSCOPY  08/12/2011   Procedure: ESOPHAGOGASTRODUODENOSCOPY (EGD);  Surgeon: Ladene Artist, MD,FACG;  Location: [facility] ENDOSCOPY;  Service: Endoscopy;  Laterality: N/A;  .  ESOPHAGOGASTRODUODENOSCOPY  06/04/2005  . FEMORAL-POPLITEAL BYPASS GRAFT Left 06/19/2016   Procedure: Left Leg BYPASS GRAFT FEMORAL-POPLITEAL ARTERY;  Surgeon: Serafina Mitchell, MD;  Location: [place];  Service: Vascular;  Laterality: Left;  . GIVENS CAPSULE STUDY  08/13/2011   Procedure: GIVENS CAPSULE STUDY;  Surgeon: Ladene Artist, MD,FACG;  Location: [facility] ENDOSCOPY;  Service: Endoscopy;  Laterality: N/A;  . HAMMER TOE SURGERY Bilateral ~ 2000  . HIATAL HERNIA REPAIR    .  HYDRADENITIS EXCISION  01/2011; 03/2012   'groin and abdomen; 03/2012" (09/14/2012)  . HYDRADENITIS EXCISION  04/01/2012   Procedure: EXCISION HYDRADENITIS GROIN;  Surgeon: Pedro Earls, MD;  Location: [facility] ORS;  Service: General;  Laterality: Bilateral;  Excision of Hydradenitis of Perineum  . HYDRADENITIS EXCISION N/A 09/17/2013   Procedure: EXCISION PERINEAL HIDRADENITIS ;  Surgeon: Pedro Earls, MD;  Location: [facility];  Service: General;  Laterality: N/A;  also in the pubis area  . LEFT HEART CATH AND CORONARY ANGIOGRAPHY N/A 12/26/2016   Procedure: LEFT HEART CATH AND CORONARY ANGIOGRAPHY;  Surgeon: [name], Peter M, MD;  Location: [place] CV LAB;  Service: Cardiovascular;  Laterality: N/A;  . MASS EXCISION Right 09/17/2013   Procedure: EXCISION MASS;  Surgeon: Pedro Earls, MD;  Location: [facility] ORS;  Service: General;  Laterality: Right;  . NISSEN FUNDOPLICATION  9798  . PERIPHERAL VASCULAR CATHETERIZATION N/A 11/03/2014   Procedure: Lower Extremity Angiography;  Surgeon: Lorretta Harp, MD;  Location: [place] CV LAB;  Service: Cardiovascular;  Laterality: N/A;  . PERIPHERAL VASCULAR CATHETERIZATION N/A 01/29/2016   Procedure: Lower Extremity Angiography;  Surgeon: Lorretta Harp, MD;  Location: [place] CV LAB;  Service: Cardiovascular;  Laterality: N/A;  . POSTERIOR LUMBAR FUSION  2008 X 2  . REDUCTION MAMMAPLASTY  1996?  . TONSILLECTOMY AND ADENOIDECTOMY  1959 AND 2000  . UVULOPALATOPHARYNGOPLASTY,  TONSILLECTOMY AND SEPTOPLASTY  2000's    Allergies  Allergen Reactions  . Sulfa Antibiotics Shortness Of Breath and Palpitations  . 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 Fish Allergy Hives and Swelling    Tongue swelling  . Iodine Swelling  . Metformin And Related Diarrhea  . Adhesive [Tape] Rash    Paper tape is ok  . Shellfish Allergy Swelling and Rash    Tongue swelling    Current Outpatient Medications  Medication Sig Dispense Refill  . albuterol (PROAIR HFA) 108 (90 Base) MCG/ACT inhaler 1-2 inhalations every 4-6 hours as needed for cough or wheeze. (Patient taking differently: Inhale 1-2 puffs into the lungs every 4 (four) hours as needed for shortness of breath. ) 1 Inhaler 1  . ALPRAZolam (XANAX) 0.5 MG tablet Take 0.25 mg by mouth daily as needed for anxiety or sleep.     Marland Kitchen amLODipine (NORVASC) 5 MG tablet Take 1 tablet (5 mg total) by mouth daily. 30 tablet 11  . aspirin EC 81 MG tablet Take 81 mg by mouth daily.    . Azelastine HCl 0.15 % SOLN Place 2 sprays into both nostrils 2 (two) times daily. (Patient taking differently: Place 2 sprays into both nostrils 2 (two) times daily as needed (allergies). ) 30 mL 5  . citalopram (CELEXA) 10 MG tablet Take 10 mg by mouth daily.    . clopidogrel (PLAVIX) 75 MG tablet Take 1 tablet (75 mg total) by mouth daily with supper. 30 tablet 5  . cyclobenzaprine (FLEXERIL) 10 MG tablet Take 10 mg by mouth 3 (three) times daily as needed for muscle spasms.    Marland Kitchen dicyclomine (BENTYL) 20 MG tablet Take 1 tablet (20 mg total) by mouth 3 (three) times daily as needed for spasms.    Marland Kitchen EPINEPHrine 0.3 mg/0.3 mL IJ SOAJ injection Inject 0.3 mLs (0.3 mg total) into the muscle once. (Patient not taking: Reported on 09/18/2017) 1 Device 1  . Fenofibric Acid 105 MG TABS Take 1 tablet by mouth daily.    Marland Kitchen  HYDROcodone-acetaminophen (NORCO/VICODIN) 5-325 MG tablet Take 1 tablet by mouth 2 (two) times daily as  needed. for pain  0  . insulin aspart (NOVOLOG) 100 UNIT/ML injection Inject 5-12 Units into the skin 2 (two) times daily before a meal. Per sliding scale    . Insulin Glargine-Lixisenatide (SOLIQUA) 100-33 UNT-MCG/ML SOPN Inject into the skin. Sliding scale    . insulin lispro (HUMALOG) 100 UNIT/ML injection Inject 5-12 Units into the skin 2 (two) times daily before a meal. Per sliding scale    . isosorbide mononitrate (IMDUR) 30 MG 24 hr tablet Take 1 tablet (30 mg total) by mouth daily. 30 tablet 5  . loratadine (CLARITIN) 10 MG tablet Take 10 mg by mouth daily as needed for allergies.    Marland Kitchen meclizine (ANTIVERT) 25 MG tablet Take 1 tablet by mouth 3 (three) times daily as needed for dizziness.  3  . metoCLOPramide (REGLAN) 10 MG tablet Take 1 tablet (10 mg total) by mouth 4 (four) times daily -  before meals and at bedtime. 120 tablet 3  . mupirocin cream (BACTROBAN) 2 % Apply 1 application topically 2 (two) times daily as needed (wound care (boils)).     . nitroGLYCERIN (NITROSTAT) 0.4 MG SL tablet Place 1 tablet (0.4 mg total) under the tongue every 5 (five) minutes as needed for chest pain. Maximum 3 doses (Patient not taking: Reported on 09/18/2017) 25 tablet 1  . pantoprazole (PROTONIX) 40 MG tablet TAKE 1 TABLET BY MOUTH EVERY DAY 30 tablet 11  . propranolol ER (INDERAL LA) 60 MG 24 hr capsule Take 1 capsule (60 mg total) by mouth every evening. 30 capsule 5  . Prucalopride Succinate (MOTEGRITY) 2 MG TABS Take 1 tablet by mouth every other day.    . rosuvastatin (CRESTOR) 40 MG tablet Take 40 mg by mouth daily.    . SUMAtriptan (IMITREX) 50 MG tablet Take 50 mg by mouth every 2 (two) hours as needed for migraine.    . temazepam (RESTORIL) 30 MG capsule Take 30 mg by mouth at bedtime.     No current facility-administered medications for this visit.     ROS: See HPI for pertinent positives and negatives.   Physical Examination  Vitals:   10/06/17 1513 10/06/17 1518  BP: 106/71 110/65   Pulse: 62   Resp: 16   Temp: (!) 97.4 F (36.3 C)   TempSrc: Oral   SpO2: 99%   Weight: 145 lb (65.8 kg)   Height: 5\' 3"  (1.6 m)    Body mass index is 25.69 kg/m.  General: A&O x 3, WDWN, female. Gait: normal HENT: No gross abnormalities.  Eyes: PERRLA. Pulmonary: Respirations are non labored, CTAB, good air movement Cardiac: regular rhythm, no detected murmur.         Carotid Bruits Right Left   Negative Negative   Radial pulses are 2+ palpable bilaterally   Adominal aortic pulse is not palpable                         VASCULAR EXAM: Extremities without ischemic changes, without Gangrene; without open wounds.  LE Pulses Right Left       FEMORAL  faintly palpable  1+ palpable        POPLITEAL  not palpable   not palpable       POSTERIOR TIBIAL  not palpable   not palpable        DORSALIS PEDIS      ANTERIOR TIBIAL 1+ palpable  not palpable    Abdomen: soft, NT, no palpable masses. Skin: no rashes, no cellulitis, no ulcers noted. Musculoskeletal: no muscle wasting or atrophy.  Neurologic: A&O X 3; appropriate affect, Sensation is normal except slightly decreased sensation to touch at left hand and left lower leg; MOTOR FUNCTION:  moving all extremities equally, motor strength 5/5 throughout except 4/5 in left LE.Marland Kitchen Speech is fluent/normal. CN 2-12 intact. Psychiatric: Thought content is normal, mood appropriate for clinical situation.     ASSESSMENT: [name] is a 62 y.o. female who is status post left femoral to popliteal artery bypass graft with PTFE on 06/09/2016 by Dr. Trula Slade for lifestyle limiting claudication.   She no longer seems to have claudication sx's with waling, there are no signs of ischemia in her feet or legs.  She has mild residual left hemiparesis/hemiplegia since her 2011 stroke.  She has seen ortho re her bilateral hip pain.   Pt  states she would like [place] to continue to monitor her peripheral artery disease instead of Dr. Gwenlyn Found.   Her atherosclerotic risk factors include smoking x 44 years, uncontrolled DM, and CKD stage 3b. She takes a daily ASA, Plavix, and a statin.  She has increased her walking.    DATA  Left LE arterial Duplex (10/06/17); Homogenous plaque in the proximal anastomosis (186 cm/s), tri and biphasic waveforms.   ABI (Date: 10/06/2017):  R:   ABI: 0.88 (was 0.80 on 04-02-17),   PT: bi  DP: tri  TBI:  0.54 (was 0.38)  L:   ABI: 1.02 (was 0.95),   PT: bi  DP: bi  TBI: 0.52 (was 0.47)  Improved bilateral ABI and TBI, mild disease on the right with bi and triphasic waveforms.  Normal ABI on the left with biphasic waveforms.     PLAN:  Based on the patient's vascular studies and examination, pt will return to clinic in 9 months with left LE arterial duplex and ABI's.  I advised her to notify us if she develops concerns re the circulation in her feet or legs.   Over 3 minutes was spent counseling patient re smoking cessation, and patient was given several free resources re smoking cessation.   I discussed in depth with the patient the nature of atherosclerosis, and emphasized the importance of maximal medical management including strict control of blood pressure, blood glucose, and lipid levels, obtaining regular exercise, and cessation of smoking.  The patient is aware that without maximal medical management the underlying atherosclerotic disease process will progress, limiting the benefit of any interventions.  The patient was given information about PAD including signs, symptoms, treatment, what symptoms should prompt the patient to seek immediate medical care, and risk reduction measures to take.  Clemon Chambers, RN, MSN, FNP-C [facility] Phone: [phone]  Clinic MD: Trula Slade  10/06/17 3:20 PM

## 2017-10-10 ENCOUNTER — Telehealth: Payer: Self-pay | Admitting: Internal Medicine

## 2017-10-10 NOTE — Telephone Encounter (Signed)
DOD- Dr Carlean Purl patient. Patient calls with complaints of 3 days of diarrhea. Watery stools in excess of 3 a day. Incontinent stools in the night. Does not feel bad. No nausea, fever, or abdominal pain.  Started Reglan recently for gastroparesis.

## 2017-10-10 NOTE — Telephone Encounter (Signed)
Patient agrees to this plan. She will call next week with an update and further instructions on the Reglan.

## 2017-10-10 NOTE — Telephone Encounter (Signed)
Stop Reglan for now BRATT diet  Keep well hydrated Call if symptoms do not improve

## 2017-10-15 ENCOUNTER — Other Ambulatory Visit: Payer: Self-pay | Admitting: Physician Assistant

## 2017-10-17 ENCOUNTER — Other Ambulatory Visit: Payer: Self-pay

## 2017-10-17 DIAGNOSIS — I779 Disorder of arteries and arterioles, unspecified: Secondary | ICD-10-CM

## 2017-11-10 DIAGNOSIS — E1121 Type 2 diabetes mellitus with diabetic nephropathy: Secondary | ICD-10-CM | POA: Diagnosis not present

## 2017-11-10 DIAGNOSIS — E1140 Type 2 diabetes mellitus with diabetic neuropathy, unspecified: Secondary | ICD-10-CM | POA: Diagnosis not present

## 2017-11-10 DIAGNOSIS — I10 Essential (primary) hypertension: Secondary | ICD-10-CM | POA: Diagnosis not present

## 2017-11-10 DIAGNOSIS — E782 Mixed hyperlipidemia: Secondary | ICD-10-CM | POA: Diagnosis not present

## 2017-11-17 DIAGNOSIS — I10 Essential (primary) hypertension: Secondary | ICD-10-CM | POA: Diagnosis not present

## 2017-11-17 DIAGNOSIS — E782 Mixed hyperlipidemia: Secondary | ICD-10-CM | POA: Diagnosis not present

## 2017-11-17 DIAGNOSIS — I739 Peripheral vascular disease, unspecified: Secondary | ICD-10-CM | POA: Diagnosis not present

## 2017-11-17 DIAGNOSIS — G4700 Insomnia, unspecified: Secondary | ICD-10-CM | POA: Diagnosis not present

## 2017-11-17 DIAGNOSIS — E1140 Type 2 diabetes mellitus with diabetic neuropathy, unspecified: Secondary | ICD-10-CM | POA: Diagnosis not present

## 2017-11-25 ENCOUNTER — Encounter: Payer: Self-pay | Admitting: Internal Medicine

## 2017-11-25 ENCOUNTER — Ambulatory Visit: Payer: Medicare Other | Admitting: Internal Medicine

## 2017-11-25 VITALS — BP 148/90 | HR 76 | Ht 63.0 in | Wt 145.0 lb

## 2017-11-25 DIAGNOSIS — E1143 Type 2 diabetes mellitus with diabetic autonomic (poly)neuropathy: Secondary | ICD-10-CM | POA: Diagnosis not present

## 2017-11-25 DIAGNOSIS — K580 Irritable bowel syndrome with diarrhea: Secondary | ICD-10-CM

## 2017-11-25 DIAGNOSIS — K3184 Gastroparesis: Secondary | ICD-10-CM

## 2017-11-25 DIAGNOSIS — R197 Diarrhea, unspecified: Secondary | ICD-10-CM

## 2017-11-25 NOTE — Progress Notes (Signed)
[name] 62 y.o. [birth year] [id number]  Assessment & Plan:   Encounter Diagnoses  Name Primary?  . Diarrhea, unspecified type Yes  . Hypercalcemia   . Irritable bowel syndrome with diarrhea   . Diabetic gastroparesis associated with type 2 diabetes mellitus ([place])    Orders Placed This Encounter  Procedures  . Pancreatic Elastase, Fecal  . Fecal fat, qualitative     She may be having exacerbations of diarrhea related to diabetes.  She could have pancreatic insufficiency.  We will do stool tests as above.  Consider pancreatic enzyme supplementation.  Given the recurrence off metoclopramide that was probably not the culprit.  We will leave her off of that for now anyway.  I am requesting labs from primary care she had hypercalcemia when I checked before and she stopped her calcium but she has restarted it which I think is likely a mistake.  She thinks it helps her bones.  Question if she has hyperparathyroidism.  I appreciate the opportunity to care for this patient. CC: Jennifer Squibb, MD   Subjective:   Chief Complaint: diarrhea and fecal incontinence  HPI [name] is here for follow-up of gastroparesis, I had treated her with Reglan but she had terrible diarrhea in early June and this was stopped by my partner.  She did okay and then last week she had 3 days of loose diarrhea with urgency and incontinence.  She is recovering from that but not back to normal.  Still remains bloated.  Does not know if stools are oily or greasy.  Says her diabetes is under better control though in the last day or 2 she has had a blood sugar above 200 and is eating [employer] sausage [employer] in the morning.  She is aware that is not appropriate for a diabetic diet.  However she thinks it has not adversely been affecting her blood glucoses.  She has a new monitoring device that seems to be helping she thinks. Allergies  Allergen Reactions  . Sulfa Antibiotics Shortness Of Breath and  Palpitations  . 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 Fish Allergy Hives and Swelling    Tongue swelling  . Iodine Swelling  . Metformin And Related Diarrhea  . [name] [Insulin Glargine-Lixisenatide] Itching and Other (See Comments)    "tongue swelling"  . Adhesive [Tape] Rash    Paper tape is ok  . Shellfish Allergy Swelling and Rash    Tongue swelling   Current Meds  Medication Sig  . albuterol (PROAIR HFA) 108 (90 Base) MCG/ACT inhaler 1-2 inhalations every 4-6 hours as needed for cough or wheeze. (Patient taking differently: Inhale 1-2 puffs into the lungs every 4 (four) hours as needed for shortness of breath. )  . ALPRAZolam (XANAX) 0.5 MG tablet Take 0.25 mg by mouth daily as needed for anxiety or sleep.   Marland Kitchen amLODipine (NORVASC) 5 MG tablet Take 1 tablet (5 mg total) by mouth daily.  Marland Kitchen aspirin EC 81 MG tablet Take 81 mg by mouth daily.  . Azelastine HCl 0.15 % SOLN Place 2 sprays into both nostrils 2 (two) times daily. (Patient taking differently: Place 2 sprays into both nostrils 2 (two) times daily as needed (allergies). )  . citalopram (CELEXA) 10 MG tablet Take 10 mg by mouth daily.  . clopidogrel (PLAVIX) 75 MG tablet Take 1 tablet (75 mg total) by mouth daily with supper.  . cyclobenzaprine (FLEXERIL) 10 MG tablet  Take 10 mg by mouth 3 (three) times daily as needed for muscle spasms.  Marland Kitchen EPINEPHrine 0.3 mg/0.3 mL IJ SOAJ injection Inject 0.3 mLs (0.3 mg total) into the muscle once.  . Fenofibric Acid 105 MG TABS Take 1 tablet by mouth daily.  Marland Kitchen HYDROcodone-acetaminophen (NORCO/VICODIN) 5-325 MG tablet Take 1 tablet by mouth 2 (two) times daily as needed. for pain  . insulin lispro (HUMALOG) 100 UNIT/ML injection Inject 5-12 Units into the skin 2 (two) times daily before a meal. Per sliding scale  . isosorbide mononitrate (IMDUR) 30 MG 24 hr tablet Take 1 tablet (30 mg total) by mouth daily.  Marland Kitchen loratadine (CLARITIN) 10 MG tablet  Take 10 mg by mouth daily as needed for allergies.  Marland Kitchen meclizine (ANTIVERT) 25 MG tablet Take 1 tablet by mouth 3 (three) times daily as needed for dizziness.  . mupirocin cream (BACTROBAN) 2 % Apply 1 application topically 2 (two) times daily as needed (wound care (boils)).   . nitroGLYCERIN (NITROSTAT) 0.4 MG SL tablet PLACE 1 TAB UNDER TONGUE EVERY 5 MINS AS NEEDED FOR CHEST PAIN - MAX 3 DOSES THEN 911  . pantoprazole (PROTONIX) 40 MG tablet TAKE 1 TABLET BY MOUTH EVERY DAY  . propranolol ER (INDERAL LA) 60 MG 24 hr capsule Take 1 capsule (60 mg total) by mouth every evening.  . Prucalopride Succinate (MOTEGRITY) 2 MG TABS Take 1 tablet by mouth every other day.  . rosuvastatin (CRESTOR) 40 MG tablet Take 40 mg by mouth daily.  . SUMAtriptan (IMITREX) 50 MG tablet Take 50 mg by mouth every 2 (two) hours as needed for migraine.  . temazepam (RESTORIL) 30 MG capsule Take 30 mg by mouth at bedtime.   Past Medical History:  Diagnosis Date  . 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 Anemia    hx  . Angioedema 07/10/2015  . Anxiety   . Arthritis    "neck, left hand" (09/14/2012)  . Asthma   . Cancer ([place]) 1985   ovarian, no treatment except surgery  . Complication of anesthesia    OCCASIONAL TROUBLE TURNING NECK TO RIGHT  . Critical lower limb ischemia    10/2014 s/p L SFA stenting  . Diverticulosis of colon with hemorrhage April 2013  . GERD (gastroesophageal reflux disease)   . H/O hiatal hernia   . Heart attack [facility])    2003 mild MI, March 2013 mild MI  . Hidradenitis    groin  . History of blood transfusion 1985 AND 2013  . Hyperlipidemia   . Hypertension   . Irritable bowel syndrome   . Left-sided weakness    since stroke, left eye trouble seeing  . Migraines   . Mild CAD    a. Cath 09/2010: mild luminal irregularities of LAD, 30% prox RCA and 20-30% mRCA, EF 65%.  . Neuropathy   . Obesity   . PAD (peripheral artery disease) ([place])    a. critical limb ischemia s/p PTA/stenting of L SFA  10/2014. c. occ prior SFA stent by angio 01/2016, for possible PV bypass.  . Pneumonia    baby  . Recurrent upper respiratory infection (URI)   . S/P arterial stent-mid Lt SFA 11/03/14 11/04/2014  . Schatzki's ring   . Sinus problem   . Stomach ulcer 1972   non-bleeding  . Stroke ([place]) 07-2007, 07-2008, 07-2009   total 3 strokes; mild left sided weakness and left eye "jumps".  . Type II diabetes mellitus ([place])   . Vitamin B 12 deficiency 07-15-2013  Past Surgical History:  Procedure Laterality Date  . ABDOMINAL HYSTERECTOMY  1985  . ADENOIDECTOMY    . ANTERIOR CERVICAL DECOMP/DISCECTOMY FUSION  2002  . ANTERIOR CERVICAL DECOMP/DISCECTOMY FUSION N/A 04/08/2014   Procedure: Cervical Six-Seven ANTERIOR CERVICAL DECOMPRESSION/DISCECTOMY FUSION Plating and Bonegraft  2 LEVELS;  Surgeon: Ashok Pall, MD;  Location: [facility] NEURO ORS;  Service: Neurosurgery;  Laterality: N/A;  Cervical Six-Seven ANTERIOR CERVICAL DECOMPRESSION/DISCECTOMY FUSION Plating and Bonegraft  2 LEVELS  . APPENDECTOMY  1985  . AXILLARY HIDRADENITIS EXCISION  1990-2008   bilateral  . BACK SURGERY    . BREAST BIOPSY Right 2007  . BREAST CYST EXCISION Right 2008  . BREAST REDUCTION SURGERY    . CARDIAC CATHETERIZATION  2004   mild disease  . CATARACT EXTRACTION W/PHACO Left 05/16/2015   Procedure: CATARACT EXTRACTION PHACO AND INTRAOCULAR LENS PLACEMENT (IOC);  Surgeon: Rutherford Guys, MD;  Location: [facility];  Service: Ophthalmology;  Laterality: Left;  CDE: 4.24  . CATARACT EXTRACTION W/PHACO Right 05/30/2015   Procedure: CATARACT EXTRACTION RIGHT EYE PHACO AND INTRAOCULAR LENS PLACEMENT ;  Surgeon: Rutherford Guys, MD;  Location: [facility];  Service: Ophthalmology;  Laterality: Right;  CDE:4.08  . CHOLECYSTECTOMY  1990's  . COLONOSCOPY  08/12/2011   Procedure: COLONOSCOPY;  Surgeon: Ladene Artist, MD,FACG;  Location: [facility] END[facility]COPY;  Service: Endoscopy;  Laterality: N/A;  . COLONOSCOPY  06/19/2006  . cyst thigh Right   .  ESOPHAGOGASTRODUODENOSCOPY  08/12/2011   Procedure: ESOPHAGOGASTRODUODENOSCOPY (EGD);  Surgeon: Ladene Artist, MD,FACG;  Location: [facility] ENDOSCOPY;  Service: Endoscopy;  Laterality: N/A;  . ESOPHAGOGASTRODUODENOSCOPY  06/04/2005  . FEMORAL-POPLITEAL BYPASS GRAFT Left 06/19/2016   Procedure: Left Leg BYPASS GRAFT FEMORAL-POPLITEAL ARTERY;  Surgeon: Serafina Mitchell, MD;  Location: [facility];  Service: Vascular;  Laterality: Left;  . GIVENS CAPSULE STUDY  08/13/2011   Procedure: GIVENS CAPSULE STUDY;  Surgeon: Ladene Artist, MD,FACG;  Location: [facility] ENDOSCOPY;  Service: Endoscopy;  Laterality: N/A;  . HAMMER TOE SURGERY Bilateral ~ 2000  . HIATAL HERNIA REPAIR    . HYDRADENITIS EXCISION  01/2011; 03/2012   'groin and abdomen; 03/2012" (09/14/2012)  . HYDRADENITIS EXCISION  04/01/2012   Procedure: EXCISION HYDRADENITIS GROIN;  Surgeon: Pedro Earls, MD;  Location: [facility]S;  Service: General;  Laterality: Bilateral;  Excision of Hydradenitis of Perineum  . HYDRADENITIS EXCISION N/A 09/17/2013   Procedure: EXCISION PERINEAL HIDRADENITIS ;  Surgeon: Pedro Earls, MD;  Location: [facility]S;  Service: General;  Laterality: N/A;  also in the pubis area  . LEFT HEART CATH AND CORONARY ANGIOGRAPHY N/A 12/26/2016   Procedure: LEFT HEART CATH AND CORONARY ANGIOGRAPHY;  Surgeon: [name], Peter M, MD;  Location: [facility] CV LAB;  Service: Cardiovascular;  Laterality: N/A;  . MASS EXCISION Right 09/17/2013   Procedure: EXCISION MASS;  Surgeon: Pedro Earls, MD;  Location: [facility] ORS;  Service: General;  Laterality: Right;  . NISSEN FUNDOPLICATION  8841  . PERIPHERAL VASCULAR CATHETERIZATION N/A 11/03/2014   Procedure: Lower Extremity Angiography;  Surgeon: Lorretta Harp, MD;  Location: [facility] CV LAB;  Service: Cardiovascular;  Laterality: N/A;  . PERIPHERAL VASCULAR CATHETERIZATION N/A 01/29/2016   Procedure: Lower Extremity Angiography;  Surgeon: Lorretta Harp, MD;  Location: [place] CV LAB;  Service:  Cardiovascular;  Laterality: N/A;  . POSTERIOR LUMBAR FUSION  2008 X 2  . REDUCTION MAMMAPLASTY  1996?  . TONSILLECTOMY AND ADENOIDECTOMY  1959 AND 2000  . 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  Divorced, living alone in [place].     family history includes Allergic rhinitis in her sister; Aneurysm in her sister; Breast cancer in her mother; Diabetes in her father and sister; Emphysema in her unknown relative; Heart disease in her father, mother, and sister; Hyperlipidemia in her sister; Hypertension in her mother, sister, and sister; Stroke in her father.   Review of Systems As per HPI  Objective:   Physical Exam BP (!) 148/90   Pulse 76   Ht 5\' 3"  (1.6 m)   Wt 145 lb (65.8 kg)   BMI 25.69 kg/m  No acute distress  15 minutes time spent with patient > half in counseling coordination of care

## 2017-11-25 NOTE — Patient Instructions (Signed)
  Your provider has requested that you go to the basement level for lab work before leaving today. Press "B" on the elevator. The lab is located at the first door on the left as you exit the elevator.   We are going to request your last 3 months of lab work from your PCP.    I appreciate the opportunity to care for you. Silvano Rusk, MD, [facility]

## 2017-11-27 ENCOUNTER — Ambulatory Visit (INDEPENDENT_AMBULATORY_CARE_PROVIDER_SITE_OTHER): Payer: Medicare Other | Admitting: Orthopaedic Surgery

## 2017-11-27 ENCOUNTER — Encounter (INDEPENDENT_AMBULATORY_CARE_PROVIDER_SITE_OTHER): Payer: Self-pay | Admitting: Orthopaedic Surgery

## 2017-11-27 VITALS — BP 170/101 | HR 69 | Ht 64.0 in | Wt 143.0 lb

## 2017-11-27 DIAGNOSIS — L942 Calcinosis cutis: Secondary | ICD-10-CM | POA: Diagnosis not present

## 2017-11-28 ENCOUNTER — Encounter (INDEPENDENT_AMBULATORY_CARE_PROVIDER_SITE_OTHER): Payer: Self-pay | Admitting: Orthopaedic Surgery

## 2017-11-28 NOTE — Progress Notes (Signed)
Office Visit Note   Patient: [name]           Date of Birth: Apr 24, 1956           MRN: [id number] Visit Date: 11/27/2017              Requested by: [name], MD [place], [place] [address] PCP: [name], MD   Assessment & Plan: Visit Diagnoses:  1. Subcutaneous calcification           Right hip with old trauma without patient recollection of trauma.  She has an old scar present which is healed. 2.  Hypertension-not controlled.  170/101 3.  Right lateral hip subcutaneous calcification from old trauma. Plan: She will see her PCP about her uncontrolled blood pressure.  We reviewed x-rays that showed the subcutaneous calcification reviewed the scar that healed and present and undoubtedly patient had past history of trauma but does not recall it.  No further treatment is necessary at this time.  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  We reviewed previous CT scan, plain radiographs and previous MRI of her hip with patient today and reviewed results.  Follow-Up Instructions: Return if symptoms worsen or fail to improve.   Orders:  No orders of the defined types were placed in this encounter.  No orders of the defined types were placed in this encounter.     Procedures: No procedures performed   Clinical Data: No additional findings.   Subjective: Chief Complaint  Patient presents with  . Right Hip - Pain    HPI 62 year old female here with right lateral hip pain at the request of Dr. Allyn Kenner.  She has tenderness to touch laterally over the greater trochanteric region where she has some subcutaneous calcification.  She did have trochanteric injection by Dr. Aline Brochure in December 2018.  She has bilateral hip AVN without collapse and denies anterior groin pain.  She notes dimpling laterally over her right hip but denies any history of trauma.  She has had strokes x2 and also had history of pancreatitis,  femoropopliteal bypass, hypertension.  Review of Systems positive for hypertension with elevated blood pressure today 170/101 which we discussed.  Previous femoropopliteal bypass she is restarted smoking again she quit when she had her bypass.  PAD, hyperlipidemia, IBS, abdominal pain, history of pancreatitis, type 2 diabetes.  Previous cervical fusion by Dr. Christella Noa 2015 at C6-7.  Type 2 diabetes.  Bilateral hip AVN without collapse.  14 point review of systems otherwise negative as it pertains HPI.   Objective: Vital Signs: BP (!) 170/101   Pulse 69   Ht 5\' 4"  (1.626 m)   Wt 143 lb (64.9 kg)   BMI 24.55 kg/m   Physical Exam  Constitutional: She is oriented to person, place, and time. She appears well-developed.  HENT:  Head: Normocephalic.  Right Ear: External ear normal.  Left Ear: External ear normal.  Eyes: Pupils are equal, round, and reactive to light.  Neck: No tracheal deviation present. No thyromegaly present.  Cardiovascular: Normal rate.  Pulmonary/Chest: Effort normal.  Abdominal: Soft.  Neurological: She is alert and oriented to person, place, and time.  Skin: Skin is warm and dry.  Psychiatric: She has a normal mood and affect. Her behavior is normal.    Ortho Exam patient has some cutaneous calcification with some dimpling bilaterally over the trochanter which corresponds to areas on plain radiographs were she has  some cutaneous calcification.  There is a 2 inch scar posterior to this which is healed the patient does not recall any history of trauma.  Patient has good hip range of motion without pain no hip flexion contracture.  Knees reach full extension lower extremity reflexes are 2+ anterior tib gastrocsoleus is strong she is able to heel and toe walk.  Specialty Comments:  No specialty comments available.  Imaging: CLINICAL DATA:  Painful palpable area which is increased in size. No known injury.  EXAM: MRI OF THE RIGHT HIP WITHOUT AND WITH  CONTRAST  TECHNIQUE: Multiplanar, multisequence MR imaging was performed both before and after administration of intravenous contrast.  CONTRAST:  94mL MULTIHANCE GADOBENATE DIMEGLUMINE 529 MG/ML IV SOLN  COMPARISON:  None.  FINDINGS: Bones: No acute hip fracture or dislocation.  Serpiginous subchondral linear signal abnormality without significant surrounding marrow edema in the superior right femoral head most consistent with avascular necrosis without articular surface collapse.  Serpiginous subchondral linear signal abnormality with minimal marrow edema in the superior left femoral head most consistent with avascular necrosis without articular surface collapse.  Normal sacrum and sacroiliac joints. No SI joint widening or erosive changes.  Mild degenerative disc disease with disc desiccation at L4-5.  Articular cartilage and labrum  Articular cartilage:  No chondral defect.  Labrum: Grossly intact, but evaluation is limited by lack of intraarticular fluid.  Joint or bursal effusion  Joint effusion:  No hip joint effusion.  No SI joint effusion.  Bursae:  No bursa formation.  Muscles and tendons  Flexors: Normal.  Extensors: Normal.  Abductors: Normal.  Adductors: Normal.  Gluteals: Normal.  Hamstrings: Normal.  Other findings  Miscellaneous: No pelvic free fluid. No fluid collection or hematoma. No inguinal lymphadenopathy. No inguinal hernia.  Low signal foci in the subcutaneous fat overlying the lateral margin of the right gluteus maximus muscle most consistent with dystrophic calcifications when compared with prior CT abdomen dated 07/26/2015. These areas are unchanged compared with the prior examination. Linear thickened bands of low signal in the subcutaneous fat without areas of abnormal enhancement likely reflect fibrosis secondary to prior injury. No soft tissue mass or fluid collection at the site of clinical  palpable abnormality.  IMPRESSION: 1. At the site of clinical palpable abnormality there is no soft tissue mass fluid collection. There are dystrophic calcifications and thickened bands of soft tissue in the subcutaneous fat along the lateral margin of the right gluteus maximus muscle most consistent with old posttraumatic changes. 2. Avascular necrosis of bilateral hips without articular surface collapse or superimposed arthropathy.   Electronically Signed   By: Kathreen Devoid   On: 04/17/2017 08:26    PMFS History: Patient Active Problem List   Diagnosis Date Noted  . Diabetic gastroparesis associated with type 2 diabetes mellitus ([place]) 11/25/2017  . Precordial chest pain 12/24/2016  . Nausea & vomiting 12/24/2016  . PAD (peripheral artery disease) ([place]) 06/19/2016  . Diabetic neuropathy ([place]) 01/31/2016  . Obesity 01/31/2016  . Mild CAD   . Claudication ([place]) 01/29/2016  . Food allergy 08/02/2015  . Recurrent urticaria 07/10/2015  . Angioedema 07/10/2015  . Perennial allergic rhinitis with a possible non-allergic component 07/10/2015  . History of asthma 07/10/2015  . S/P femoral-popliteal bypass surgery 11/04/2014  . Critical lower limb ischemia 11/03/2014  . Peripheral arterial disease ([place]) 11/02/2014  . HNP (herniated nucleus pulposus) with myelopathy, cervical 04/11/2014  . HNP (herniated nucleus pulposus), cervical 04/08/2014  . Pancreatitis 01/02/2013  . Abdominal pain,  epigastric 12/14/2012  . IBS (irritable bowel syndrome) 09/12/2011  . GERD (gastroesophageal reflux disease) 09/12/2011  . Diverticulosis of colon with hemorrhage 08/10/2011  . Chest pain at rest 07/06/2011    Class: Acute  . Hidradenitis, left pubic area and left lower quadrant 01/18/2011  . DM2 (diabetes mellitus, type 2) ([place]) 08/24/2007  . Hyperlipidemia LDL goal <70 08/24/2007  . Essential hypertension 08/24/2007   Past Medical History:  Diagnosis Date  . Allergic urticaria  07/10/2015  . Anemia    hx  . Angioedema 07/10/2015  . Anxiety   . Arthritis    "neck, left hand" (09/14/2012)  . Asthma   . Cancer ([place]) 1985   ovarian, no treatment except surgery  . Complication of anesthesia    OCCASIONAL TROUBLE TURNING NECK TO RIGHT  . Critical lower limb ischemia    10/2014 s/p L SFA stenting  . Diverticulosis of colon with hemorrhage April 2013  . GERD (gastroesophageal reflux disease)   . H/O hiatal hernia   . Heart attack [facility])    2003 mild MI, March 2013 mild MI  . Hidradenitis    groin  . History of blood transfusion 1985 AND 2013  . Hyperlipidemia   . Hypertension   . Irritable bowel syndrome   . Left-sided weakness    since stroke, left eye trouble seeing  . Migraines   . Mild CAD    a. Cath 09/2010: mild luminal irregularities of LAD, 30% prox RCA and 20-30% mRCA, EF 65%.  . Neuropathy   . Obesity   . PAD (peripheral artery disease) ([place])    a. critical limb ischemia s/p PTA/stenting of L SFA 10/2014. c. occ prior SFA stent by angio 01/2016, for possible PV bypass.  . Pneumonia    baby  . Recurrent upper respiratory infection (URI)   . S/P arterial stent-mid Lt SFA 11/03/14 11/04/2014  . Schatzki's ring   . Sinus problem   . Stomach ulcer 1972   non-bleeding  . Stroke ([place]) 07-2007, 07-2008, 07-2009   total 3 strokes; mild left sided weakness and left eye "jumps".  . Type II diabetes mellitus ([place])   . Vitamin B 12 deficiency 07-15-2013    Family History  Problem Relation Age of Onset  . Breast cancer Mother   . Heart disease Mother   . Hypertension Mother   . Diabetes Father   . Heart disease Father   . Stroke Father   . Heart disease Sister   . Hypertension Sister   . Hypertension Sister   . Hyperlipidemia Sister   . Diabetes Sister   . Allergic rhinitis Sister   . Emphysema Unknown        great uncle  . Aneurysm Sister        brain  . Angioedema Neg Hx   . Asthma Neg Hx   . Eczema Neg Hx   . Immunodeficiency Neg Hx   .  Urticaria Neg Hx     Past Surgical History:  Procedure Laterality Date  . ABDOMINAL HYSTERECTOMY  1985  . ADENOIDECTOMY    . ANTERIOR CERVICAL DECOMP/DISCECTOMY FUSION  2002  . ANTERIOR CERVICAL DECOMP/DISCECTOMY FUSION N/A 04/08/2014   Procedure: Cervical Six-Seven ANTERIOR CERVICAL DECOMPRESSION/DISCECTOMY FUSION Plating and Bonegraft  2 LEVELS;  Surgeon: Ashok Pall, MD;  Location: [facility];  Service: Neurosurgery;  Laterality: N/A;  Cervical Six-Seven ANTERIOR CERVICAL DECOMPRESSION/DISCECTOMY FUSION Plating and Bonegraft  2 LEVELS  . APPENDECTOMY  1985  . AXILLARY HIDRADENITIS EXCISION  1990-2008  bilateral  . BACK SURGERY    . BREAST BIOPSY Right 2007  . BREAST CYST EXCISION Right 2008  . BREAST REDUCTION SURGERY    . CARDIAC CATHETERIZATION  2004   mild disease  . CATARACT EXTRACTION W/PHACO Left 05/16/2015   Procedure: CATARACT EXTRACTION PHACO AND INTRAOCULAR LENS PLACEMENT (IOC);  Surgeon: Rutherford Guys, MD;  Location: [facility];  Service: Ophthalmology;  Laterality: Left;  CDE: 4.24  . CATARACT EXTRACTION W/PHACO Right 05/30/2015   Procedure: CATARACT EXTRACTION RIGHT EYE PHACO AND INTRAOCULAR LENS PLACEMENT ;  Surgeon: Rutherford Guys, MD;  Location: [facility];  Service: Ophthalmology;  Laterality: Right;  CDE:4.08  . CHOLECYSTECTOMY  1990's  . COLONOSCOPY  08/12/2011   Procedure: COLONOSCOPY;  Surgeon: Ladene Artist, MD,FACG;  Location[facility];  Service: Endoscopy;  Laterality: N/A;  . COLONOSCOPY  06/19/2006  . cyst thigh Right   . ESOPHAGOGASTRODUODENOSCOPY  08/12/2011   Procedure: ESOPHAGOGASTRODUODENOSCOPY (EGD);  Surgeon: Ladene Artist, MD,FACG;  Location: [facility] ENDOSCOPY;  Service: Endoscopy;  Laterality: N/A;  . ESOPHAGOGASTRODUODENOSCOPY  06/04/2005  . FEMORAL-POPLITEAL BYPASS GRAFT Left 06/19/2016   Procedure: Left Leg BYPASS GRAFT FEMORAL-POPLITEAL ARTERY;  Surgeon: Serafina Mitchell, MD;  Location: [facility];  Service: Vascular;  Laterality: Left;  . GIVENS CAPSULE STUDY   08/13/2011   Procedure: GIVENS CAPSULE STUDY;  Surgeon: Ladene Artist, MD,FACG;  Location: [facility] ENDOSCOPY;  Service: Endoscopy;  Laterality: N/A;  . HAMMER TOE SURGERY Bilateral ~ 2000  . HIATAL HERNIA REPAIR    . HYDRADENITIS EXCISION  01/2011; 03/2012   'groin and abdomen; 03/2012" (09/14/2012)  . HYDRADENITIS EXCISION  04/01/2012   Procedure: EXCISION HYDRADENITIS GROIN;  Surgeon: Pedro Earls, MD;  Location: [facility];  Service: General;  Laterality: Bilateral;  Excision of Hydradenitis of Perineum  . HYDRADENITIS EXCISION N/A 09/17/2013   Procedure: EXCISION PERINEAL HIDRADENITIS ;  Surgeon: Pedro Earls, MD;  Location: [facility];  Service: General;  Laterality: N/A;  also in the pubis area  . LEFT HEART CATH AND CORONARY ANGIOGRAPHY N/A 12/26/2016   Procedure: LEFT HEART CATH AND CORONARY ANGIOGRAPHY;  Surgeon: [name], Peter M, MD;  Location: [facility] CV LAB;  Service: Cardiovascular;  Laterality: N/A;  . MASS EXCISION Right 09/17/2013   Procedure: EXCISION MASS;  Surgeon: Pedro Earls, MD;  Location: [facility];  Service: General;  Laterality: Right;  . NISSEN FUNDOPLICATION  4742  . PERIPHERAL VASCULAR CATHETERIZATION N/A 11/03/2014   Procedure: Lower Extremity Angiography;  Surgeon: Lorretta Harp, MD;  Location: [facility] CV LAB;  Service: Cardiovascular;  Laterality: N/A;  . PERIPHERAL VASCULAR CATHETERIZATION N/A 01/29/2016   Procedure: Lower Extremity Angiography;  Surgeon: Lorretta Harp, MD;  Location: [facility] CV LAB;  Service: Cardiovascular;  Laterality: N/A;  . POSTERIOR LUMBAR FUSION  2008 X 2  . REDUCTION MAMMAPLASTY  1996?  . TONSILLECTOMY AND ADENOIDECTOMY  1959 AND 2000  . UVULOPALATOPHARYNGOPLASTY, TONSILLECTOMY AND SEPTOPLASTY  2000's   Social History   Occupational History    Employer: UNEMPLOYED  Tobacco Use  . Smoking status: Former Smoker    Packs/day: 0.33    Years: 41.00    Pack years: 13.53    Types: Cigarettes    Last attempt to quit: 01/15/2016     Years since quitting: 1.8  . Smokeless tobacco: Never Used  . Tobacco comment: Getting ready to start nicotine patches RX by Dr. Gwenlyn Found per pt.  Substance and Sexual Activity  . Alcohol use: No  Alcohol/week: 0.0 oz  . Drug use: No    Types: "Crack" cocaine    Comment: 05/09/2015.  "quit 07/27/1994"  . Sexual activity: Not Currently    Birth control/protection: Abstinence

## 2017-12-01 ENCOUNTER — Other Ambulatory Visit: Payer: Medicare Other

## 2017-12-01 DIAGNOSIS — R197 Diarrhea, unspecified: Secondary | ICD-10-CM

## 2017-12-02 ENCOUNTER — Telehealth: Payer: Self-pay | Admitting: Internal Medicine

## 2017-12-02 NOTE — Telephone Encounter (Signed)
Patient states she is having trouble with her IBS again and would like to know what to do.

## 2017-12-02 NOTE — Telephone Encounter (Signed)
Patient advised to take imodium until her stool studies are resulted.  She is notified that I will contact her with the results when they return

## 2017-12-04 LAB — PANCREATIC ELASTASE, FECAL: Pancreatic Elastase, Fecal: 253 ug Elast./g (ref >200)

## 2017-12-04 LAB — FECAL FAT, QUALITATIVE
Fat Qual Neutral, Stl: NORMAL
Fat Qual Total, Stl: NORMAL

## 2017-12-08 NOTE — Progress Notes (Signed)
Let her know that pancreas is working ok. See last note - we had requested records from PCP but I do not see them  How is she ?  Diarrhea, pain, vomiting?

## 2017-12-31 DIAGNOSIS — H52203 Unspecified astigmatism, bilateral: Secondary | ICD-10-CM | POA: Diagnosis not present

## 2017-12-31 DIAGNOSIS — Z794 Long term (current) use of insulin: Secondary | ICD-10-CM | POA: Diagnosis not present

## 2017-12-31 DIAGNOSIS — Z961 Presence of intraocular lens: Secondary | ICD-10-CM | POA: Diagnosis not present

## 2017-12-31 DIAGNOSIS — H524 Presbyopia: Secondary | ICD-10-CM | POA: Diagnosis not present

## 2017-12-31 DIAGNOSIS — E119 Type 2 diabetes mellitus without complications: Secondary | ICD-10-CM | POA: Diagnosis not present

## 2018-01-01 ENCOUNTER — Telehealth: Payer: Self-pay | Admitting: Internal Medicine

## 2018-01-01 NOTE — Telephone Encounter (Signed)
Patient reports excess bloating, alternating bowel habits.  She went about 1 week without a BM.  She was seen on 11/25/17 for diarrhea.  I do not see any mention of a discussion about alternating bowel habits.  She states she was told to stop Motegrity and has not been taking it.  Please advise if there is any additional testing you are planning or treatment plan

## 2018-01-01 NOTE — Telephone Encounter (Signed)
I called her - sounds like she is having cycles of 5-7 d w/o defecation then "diarrhea" x 1-2 d  IBS-C/mixed  I told her to: Do MiraLax purge then do daily MiraLAx with qod 1-2 dulcolax prn

## 2018-01-02 ENCOUNTER — Other Ambulatory Visit: Payer: Self-pay | Admitting: Physician Assistant

## 2018-01-02 NOTE — Telephone Encounter (Signed)
Rx sent to pharmacy   

## 2018-01-02 NOTE — Telephone Encounter (Signed)
This is a Dr. Claris Che. Barrett patient

## 2018-01-28 ENCOUNTER — Telehealth: Payer: Self-pay | Admitting: *Deleted

## 2018-01-28 NOTE — Telephone Encounter (Signed)
Patient called c/o intermt. Pain in LLE that is worse with ambulation. Denies any change in CMS. Requesting appt. Will sched. With NP with Vascular labs. Instructed to go to [facility] ER for any change in temp, color,sensation or any worsening condition.

## 2018-01-30 DIAGNOSIS — R140 Abdominal distension (gaseous): Secondary | ICD-10-CM | POA: Diagnosis not present

## 2018-01-30 DIAGNOSIS — Z0000 Encounter for general adult medical examination without abnormal findings: Secondary | ICD-10-CM | POA: Diagnosis not present

## 2018-02-02 ENCOUNTER — Ambulatory Visit: Payer: Medicare Other | Admitting: Family

## 2018-02-02 ENCOUNTER — Encounter (HOSPITAL_COMMUNITY): Payer: Medicare Other

## 2018-02-11 ENCOUNTER — Ambulatory Visit (INDEPENDENT_AMBULATORY_CARE_PROVIDER_SITE_OTHER): Payer: Medicare Other | Admitting: Physician Assistant

## 2018-02-11 ENCOUNTER — Other Ambulatory Visit: Payer: Self-pay

## 2018-02-11 ENCOUNTER — Ambulatory Visit (INDEPENDENT_AMBULATORY_CARE_PROVIDER_SITE_OTHER)
Admission: RE | Admit: 2018-02-11 | Discharge: 2018-02-11 | Disposition: A | Discharge status: Home / Self Care | Payer: Medicare Other | Source: Ambulatory Visit | Attending: Surgery | Admitting: Surgery

## 2018-02-11 ENCOUNTER — Ambulatory Visit (HOSPITAL_COMMUNITY)
Admission: RE | Admit: 2018-02-11 | Discharge: 2018-02-11 | Disposition: A | Discharge status: Home / Self Care | Payer: Medicare Other | Source: Ambulatory Visit | Attending: Surgery | Admitting: Surgery

## 2018-02-11 VITALS — BP 157/96 | HR 77 | Resp 18 | Ht 64.0 in | Wt 150.1 lb

## 2018-02-11 DIAGNOSIS — I779 Disorder of arteries and arterioles, unspecified: Secondary | ICD-10-CM

## 2018-02-11 DIAGNOSIS — I739 Peripheral vascular disease, unspecified: Secondary | ICD-10-CM | POA: Diagnosis not present

## 2018-02-11 DIAGNOSIS — Z95828 Presence of other vascular implants and grafts: Secondary | ICD-10-CM | POA: Diagnosis not present

## 2018-02-11 NOTE — Progress Notes (Signed)
[facility] [place] HISTORY AND PHYSICAL   History of Present Illness:  62 y/o female first presented with claudication, left leg greater than right. The patient was initially seen and evaluated by Dr. Alvester Chou for treatment of critical limb ischemia. She has undergone atherectomy and angioplasty of an occluded left superficial femoral artery as well as stenting. The stents have now occluded.     Dr. Trula Slade performed a Left femoral to above-knee popliteal artery bypass graft with 6 mm.  She was last seen by our NP Vinnie Level Nickel on 10/06/2017.  At that time her right ABI was 0.88 with triphasic/biphasic flow and TBI of 0.54, left 0.95 and TBI 0.52.    She states she has done well without recurrent rest pain or symptoms of claudication.  She has mild left knee pain after sitting for prolonged period of time.  Once she walks for a while the pain improves.    Past medical history: mild residual left hemiparesis/hemiplegia since her 2011 stroke, B hip pain followed by [facility],  She has seen ortho re her bilateral hip pain, tobacco abuse, DM, CKD, HTN and hyperlipidemia.  She is on a statin, BB, and Plavix.    Past Medical History:  Diagnosis Date  . 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 Anemia    hx  . Angioedema 07/10/2015  . Anxiety   . Arthritis    "neck, left hand" (09/14/2012)  . Asthma   . Cancer ([place]) 1985   ovarian, no treatment except surgery  . Complication of anesthesia    OCCASIONAL TROUBLE TURNING NECK TO RIGHT  . Critical lower limb ischemia    10/2014 s/p L SFA stenting  . Diverticulosis of colon with hemorrhage April 2013  . GERD (gastroesophageal reflux disease)   . H/O hiatal hernia   . Heart attack [facility])    2003 mild MI, March 2013 mild MI  . Hidradenitis    groin  . History of blood transfusion 1985 AND 2013  . Hyperlipidemia   . Hypertension   . Irritable bowel syndrome   . Left-sided weakness    since stroke, left eye trouble seeing  . Migraines   .  Mild CAD    a. Cath 09/2010: mild luminal irregularities of LAD, 30% prox RCA and 20-30% mRCA, EF 65%.  . Neuropathy   . Obesity   . PAD (peripheral artery disease) ([place])    a. critical limb ischemia s/p PTA/stenting of L SFA 10/2014. c. occ prior SFA stent by angio 01/2016, for possible PV bypass.  . Pneumonia    baby  . Recurrent upper respiratory infection (URI)   . S/P arterial stent-mid Lt SFA 11/03/14 11/04/2014  . Schatzki's ring   . Sinus problem   . Stomach ulcer 1972   non-bleeding  . Stroke ([place]) 07-2007, 07-2008, 07-2009   total 3 strokes; mild left sided weakness and left eye "jumps".  . Type II diabetes mellitus ([place])   . Vitamin B 12 deficiency 07-15-2013    Past Surgical History:  Procedure Laterality Date  . ABDOMINAL HYSTERECTOMY  1985  . ADENOIDECTOMY    . ANTERIOR CERVICAL DECOMP/DISCECTOMY FUSION  2002  . ANTERIOR CERVICAL DECOMP/DISCECTOMY FUSION N/A 04/08/2014   Procedure: Cervical Six-Seven ANTERIOR CERVICAL DECOMPRESSION/DISCECTOMY FUSION Plating and Bonegraft  2 LEVELS;  Surgeon: Ashok Pall, MD;  Location: [place] NEURO ORS;  Service: Neurosurgery;  Laterality: N/A;  Cervical Six-Seven ANTERIOR CERVICAL DECOMPRESSION/DISCECTOMY FUSION Plating and Bonegraft  2 LEVELS  . APPENDECTOMY  1985  .  AXILLARY HIDRADENITIS EXCISION  1990-2008   bilateral  . BACK SURGERY    . BREAST BIOPSY Right 2007  . BREAST CYST EXCISION Right 2008  . BREAST REDUCTION SURGERY    . CARDIAC CATHETERIZATION  2004   mild disease  . CATARACT EXTRACTION W/PHACO Left 05/16/2015   Procedure: CATARACT EXTRACTION PHACO AND INTRAOCULAR LENS PLACEMENT (IOC);  Surgeon: Rutherford Guys, MD;  Location: [facility];  Service: Ophthalmology;  Laterality: Left;  CDE: 4.24  . CATARACT EXTRACTION W/PHACO Right 05/30/2015   Procedure: CATARACT EXTRACTION RIGHT EYE PHACO AND INTRAOCULAR LENS PLACEMENT ;  Surgeon: Rutherford Guys, MD;  Location: [facility];  Service: Ophthalmology;  Laterality: Right;  CDE:4.08  .  CHOLECYSTECTOMY  1990's  . COLONOSCOPY  08/12/2011   Procedure: COLONOSCOPY;  Surgeon: Ladene Artist, MD,FACG;  Location[facility];  Service: Endoscopy;  Laterality: N/A;  . COLONOSCOPY  06/19/2006  . cyst thigh Right   . ESOPHAGOGASTRODUODENOSCOPY  08/12/2011   Procedure: ESOPHAGOGASTRODUODENOSCOPY (EGD);  Surgeon: Ladene Artist, MD,FACG;  [facility];  Service: Endoscopy;  Laterality: N/A;  . ESOPHAGOGASTRODUODENOSCOPY  06/04/2005  . FEMORAL-POPLITEAL BYPASS GRAFT Left 06/19/2016   Procedure: Left Leg BYPASS GRAFT FEMORAL-POPLITEAL ARTERY;  Surgeon: Serafina Mitchell, MD;  Location: [place];  Service: Vascular;  Laterality: Left;  . GIVENS CAPSULE STUDY  08/13/2011   Procedure: GIVENS CAPSULE STUDY;  Surgeon: Ladene Artist, MD,FACG;  Location: [facility] ENDOSCOPY;  Service: Endoscopy;  Laterality: N/A;  . HAMMER TOE SURGERY Bilateral ~ 2000  . HIATAL HERNIA REPAIR    . HYDRADENITIS EXCISION  01/2011; 03/2012   'groin and abdomen; 03/2012" (09/14/2012)  . HYDRADENITIS EXCISION  04/01/2012   Procedure: EXCISION HYDRADENITIS GROIN;  Surgeon: Pedro Earls, MD;  Location: [facility];  Service: General;  Laterality: Bilateral;  Excision of Hydradenitis of Perineum  . HYDRADENITIS EXCISION N/A 09/17/2013   Procedure: EXCISION PERINEAL HIDRADENITIS ;  Surgeon: Pedro Earls, MD;  Location: [facility];  Service: General;  Laterality: N/A;  also in the pubis area  . LEFT HEART CATH AND CORONARY ANGIOGRAPHY N/A 12/26/2016   Procedure: LEFT HEART CATH AND CORONARY ANGIOGRAPHY;  Surgeon: [name], Peter M, MD;  Location: [place] CV LAB;  Service: Cardiovascular;  Laterality: N/A;  . MASS EXCISION Right 09/17/2013   Procedure: EXCISION MASS;  Surgeon: Pedro Earls, MD;  Location: [facility];  Service: General;  Laterality: Right;  . NISSEN FUNDOPLICATION  6073  . PERIPHERAL VASCULAR CATHETERIZATION N/A 11/03/2014   Procedure: Lower Extremity Angiography;  Surgeon: Lorretta Harp, MD;  Location: [place] CV LAB;  Service: Cardiovascular;  Laterality: N/A;  . PERIPHERAL VASCULAR CATHETERIZATION N/A 01/29/2016   Procedure: Lower Extremity Angiography;  Surgeon: Lorretta Harp, MD;  Location: [place] CV LAB;  Service: Cardiovascular;  Laterality: N/A;  . POSTERIOR LUMBAR FUSION  2008 X 2  . REDUCTION MAMMAPLASTY  1996?  . TONSILLECTOMY AND ADENOIDECTOMY  1959 AND 2000  . UVULOPALATOPHARYNGOPLASTY, TONSILLECTOMY AND SEPTOPLASTY  2000's    ROS:   General:  No weight loss, Fever, chills  HEENT: No recent headaches, no nasal bleeding, no visual changes, no sore throat  Neurologic: No dizziness, blackouts, seizures. No recent symptoms of stroke or mini- stroke. No recent episodes of slurred speech, or temporary blindness.  Cardiac: No recent episodes of chest pain/pressure, no shortness of breath at rest.  No shortness of breath with exertion.  Denies history of atrial fibrillation or irregular heartbeat  Vascular: No history of rest  pain in feet.  No history of claudication.  No history of non-healing ulcer, No history of DVT   Pulmonary: No home oxygen, no productive cough, no hemoptysis,  No asthma or wheezing  Musculoskeletal:  [ ]  Arthritis, [ ]  Low back pain,  [ ]  Joint pain  Hematologic:No history of hypercoagulable state.  No history of easy bleeding.  No history of anemia  Gastrointestinal: No hematochezia or melena,  No gastroesophageal reflux, no trouble swallowing  Urinary: [ ]  chronic Kidney disease, [ ]  on HD - [ ]  MWF or [ ]  TTHS, [ ]  Burning with urination, [ ]  Frequent urination, [ ]  Difficulty urinating;   Skin: No rashes  Psychological: No history of anxiety,  No history of depression  Social History Social History   Tobacco Use  . Smoking status: Former Smoker    Packs/day: 0.33    Years: 41.00    Pack years: 13.53    Types: Cigarettes    Last attempt to quit: 01/15/2016    Years since quitting: 2.0  . Smokeless tobacco: Never Used  . Tobacco  comment: Getting ready to start nicotine patches RX by Dr. Gwenlyn Found per pt.  Substance Use Topics  . Alcohol use: No    Alcohol/week: 0.0 standard drinks  . Drug use: No    Types: "Crack" cocaine    Comment: 05/09/2015.  "quit 07/27/1994"    Family History Family History  Problem Relation Age of Onset  . Breast cancer Mother   . Heart disease Mother   . Hypertension Mother   . Diabetes Father   . Heart disease Father   . Stroke Father   . Heart disease Sister   . Hypertension Sister   . Hypertension Sister   . Hyperlipidemia Sister   . Diabetes Sister   . Allergic rhinitis Sister   . Emphysema Unknown        great uncle  . Aneurysm Sister        brain  . Angioedema Neg Hx   . Asthma Neg Hx   . Eczema Neg Hx   . Immunodeficiency Neg Hx   . Urticaria Neg Hx     Allergies  Allergies  Allergen Reactions  . Sulfa Antibiotics Shortness Of Breath and Palpitations  . 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 Fish Allergy Hives and Swelling    Tongue swelling  . Iodine Swelling  . Metformin And Related Diarrhea  . [name] [Insulin Glargine-Lixisenatide] Itching and Other (See Comments)    "tongue swelling"  . Adhesive [Tape] Rash    Paper tape is ok  . Shellfish Allergy Swelling and Rash    Tongue swelling     Current Outpatient Medications  Medication Sig Dispense Refill  . albuterol (PROAIR HFA) 108 (90 Base) MCG/ACT inhaler 1-2 inhalations every 4-6 hours as needed for cough or wheeze. (Patient taking differently: Inhale 1-2 puffs into the lungs every 4 (four) hours as needed for shortness of breath. ) 1 Inhaler 1  . ALPRAZolam (XANAX) 0.5 MG tablet Take 0.25 mg by mouth daily as needed for anxiety or sleep.     Marland Kitchen amLODipine (NORVASC) 5 MG tablet Take 1 tablet (5 mg total) by mouth daily. 30 tablet 11  . aspirin EC 81 MG tablet Take 81 mg by mouth daily.    . Azelastine HCl 0.15 % SOLN Place 2 sprays into both nostrils 2  (two) times daily. (Patient taking differently: Place  2 sprays into both nostrils 2 (two) times daily as needed (allergies). ) 30 mL 5  . citalopram (CELEXA) 10 MG tablet Take 10 mg by mouth daily.    . clopidogrel (PLAVIX) 75 MG tablet Take 1 tablet (75 mg total) by mouth daily with supper. Patient is due for yearly appt for more refills, thank you! (Reminder) 60 tablet 0  . cyclobenzaprine (FLEXERIL) 10 MG tablet Take 10 mg by mouth 3 (three) times daily as needed for muscle spasms.    Marland Kitchen EPINEPHrine 0.3 mg/0.3 mL IJ SOAJ injection Inject 0.3 mLs (0.3 mg total) into the muscle once. 1 Device 1  . Fenofibric Acid 105 MG TABS Take 1 tablet by mouth daily.    Marland Kitchen HYDROcodone-acetaminophen (NORCO/VICODIN) 5-325 MG tablet Take 1 tablet by mouth 2 (two) times daily as needed. for pain  0  . insulin lispro (HUMALOG) 100 UNIT/ML injection Inject 5-12 Units into the skin 2 (two) times daily before a meal. Per sliding scale    . isosorbide mononitrate (IMDUR) 30 MG 24 hr tablet Take 1 tablet (30 mg total) by mouth daily. 30 tablet 5  . loratadine (CLARITIN) 10 MG tablet Take 10 mg by mouth daily as needed for allergies.    Marland Kitchen meclizine (ANTIVERT) 25 MG tablet Take 1 tablet by mouth 3 (three) times daily as needed for dizziness.  3  . mupirocin cream (BACTROBAN) 2 % Apply 1 application topically 2 (two) times daily as needed (wound care (boils)).     . nitroGLYCERIN (NITROSTAT) 0.4 MG SL tablet PLACE 1 TAB UNDER TONGUE EVERY 5 MINS AS NEEDED FOR CHEST PAIN - MAX 3 DOSES THEN 911 25 tablet 2  . pantoprazole (PROTONIX) 40 MG tablet TAKE 1 TABLET BY MOUTH EVERY DAY 30 tablet 11  . propranolol ER (INDERAL LA) 60 MG 24 hr capsule Take 1 capsule (60 mg total) by mouth every evening. 30 capsule 5  . rosuvastatin (CRESTOR) 40 MG tablet Take 40 mg by mouth daily.    . SUMAtriptan (IMITREX) 50 MG tablet Take 50 mg by mouth every 2 (two) hours as needed for migraine.    . temazepam (RESTORIL) 30 MG capsule Take 30 mg by  mouth at bedtime.     No current facility-administered medications for this visit.     Physical Examination  There were no vitals filed for this visit.  There is no height or weight on file to calculate BMI.  General:  Alert and oriented, no acute distress HEENT: Normal, normocephalic Neck: No bruit or JVD Pulmonary: Clear to auscultation bilaterally Cardiac: Regular Rate and Rhythm without murmur Abdomen: Soft, non-tender, non-distended, no mass, no scars Skin: No rash Extremity Pulses:  2+ radial, brachial, femoral, right dorsalis pedis, posterior tibial pulses, left not palpable PT/DP Musculoskeletal: No deformity or edema  Neurologic: Upper and lower extremity motor 5/5 and symmetric  DATA:  ABI's Right 0.99 TBI 0.58 Left 1.03 TBI 0.61  Arterial duplex of by pass patent flow proximal graft PSV 109 triphasic [id number] out flow   ASSESSMENT:  PAD s/p left Left femoral to above-knee popliteal artery bypass graft with 6 mm external ring PTFE    PLAN:  She has patent bypass arterial flow with improved ABI's and bypass duplex.  Her pre op symptoms have resolved and she no longer has rest pain or claudication.  At this point we will have her follow up in 6 months for repeat ABI and bypass duplex.  If she has problems or concerns she  will call our office sooner.    I did discuss her left knee symptoms on exam her knee appears normal.  I think she may have symptoms of osteoarthritis.  I recommend ice/heat and encourage a walking program.  If she has continued problems or new symptoms she should follow up with an orthopedic physician.     Roxy Horseman PA-C [facility] [place] Office: [phone]

## 2018-02-25 ENCOUNTER — Ambulatory Visit: Payer: Medicare Other | Admitting: Cardiovascular Disease

## 2018-02-25 ENCOUNTER — Encounter: Payer: Self-pay | Admitting: Cardiovascular Disease

## 2018-02-25 DIAGNOSIS — I2510 Atherosclerotic heart disease of native coronary artery without angina pectoris: Secondary | ICD-10-CM

## 2018-02-25 DIAGNOSIS — I10 Essential (primary) hypertension: Secondary | ICD-10-CM

## 2018-02-25 DIAGNOSIS — Z95828 Presence of other vascular implants and grafts: Secondary | ICD-10-CM | POA: Diagnosis not present

## 2018-02-25 DIAGNOSIS — E785 Hyperlipidemia, unspecified: Secondary | ICD-10-CM

## 2018-02-25 NOTE — Patient Instructions (Signed)
Medication Instructions:  Your physician recommends that you continue on your current medications as directed. Please refer to the Current Medication list given to you today.  If you need a refill on your cardiac medications before your next appointment, please call your pharmacy.   Lab work: NONE If you have labs (blood work) drawn today and your tests are completely normal, you will receive your results only by: Marland Kitchen MyChart Message (if you have MyChart) OR . A paper copy in the mail If you have any lab test that is abnormal or we need to change your treatment, we will call you to review the results.  Testing/Procedures: NONE  Follow-Up: At [facility], you and your health needs are our priority.  As part of our continuing mission to provide you with exceptional heart care, we have created designated Provider Care Teams.  These Care Teams include your primary Cardiologist (physician) and Advanced Practice Providers (APPs -  Physician Assistants and Nurse Practitioners) who all work together to provide you with the care you need, when you need it. We request that you follow-up in: [place], [place] and in 12 MONTHS with Dr Gwenlyn Found   Please call our office 2 months in advance to schedule this appointment.  You may see Quay Burow, MD or one of the following Advanced Practice Providers on your designated Care Team:   Kerin Ransom, PA-C Roby Lofts, [place] . Sande Rives, PA-C  Any Other Special Instructions Will Be Listed Below (If Applicable).

## 2018-02-25 NOTE — Assessment & Plan Note (Signed)
History of minimal CAD by cardiac catheterization performed by Dr. [name] in the setting of chest pain 12/26/2016.

## 2018-02-25 NOTE — Assessment & Plan Note (Signed)
History of essential hypertension her blood pressure measured today at 162/98.  She is on amlodipine and Inderal LA.  Her serum creatinine is in the 2 range and therefore she is not a candidate for ACE/ARB.

## 2018-02-25 NOTE — Assessment & Plan Note (Signed)
History of hyperlipidemia on high-dose statin therapy with lipid profile performed 07/16/2017 revealing a total cholesterol of 105.

## 2018-02-25 NOTE — Progress Notes (Signed)
02/25/2018 [name]   [birth year]  [id number]  Primary Physician [name], MD Primary Cardiologist: Lorretta Harp MD [name], [place]  HPI:  [name] is a 62 y.o. thin-appearing divorced African-American female mother of one, grandmother of one grandchild who is currently disabled because of a prior stroke. She was referred by Dr. Melony Overly, from [facility] podiatry, for evaluation and treatment of critical limb ischemia. I last saw her in the office  12/27/2015. Her cardiovascular risk factor profile is notable for a strong family history of heart disease with the father about a stent, mother who had bypass surgery and a sister who died at age 108 of a myocardial infarction. She has never had a heart attack but apparently has had a stroke in the past with some mild left-sided residua. She has a history of tobacco abuse in the last 43 years of one third pack per day trying to quit currently. She has treated diabetes, hypertension and hyperlipidemia. She had the onset of left ear pain approximately 3 months ago with progression to critical limb ischemia in early June with ischemic appearing left fourth toe. Dopplers in our office performed yesterday revealed a left ABI 0.6 with an occluded left SFA and one-vessel runoff. She will need to be admitted for angiography and potential endovascular therapy for critical limb ischemia. Angiogram to her on 11/03/14 revealing occluded left SFA. I performed [facility] one directional atherectomy, PTA and stenting using a Viabahn covered stent with an excellent angiographic and clinical result. Her pain has resolved. Her critical limb ischemia has resolved. Her Dopplers have normalized. Since I saw her approximately 6 weeks ago she's had 4 episodes of night nitroglycerin responsive chest pain. Recent Dopplers did show a decline in her left ABI from 1.1 6 months ago to .91 with a simultaneous increase in velocity in her mid left SFA. Dopplers performed  12/20/15 revealed a further reduction in her left ABI down to 0.71 with a high-frequency signal in her mid left SFA and worsening symptoms of claudication   I performed angiography on her 01/29/2016 revealing an occluded left SFA stent.  She ultimately underwent left femoropopliteal bypass grafting 06/09/2016 by Dr. Trula Slade using PTFE.  He follows his noninvasively in his office.  Her symptoms resolved.  She also underwent cardiac catheterization by Dr. [name] because of chest pain 12/26/2016 revealing minimal CAD.  Unfortunately, she has gone back to smoking 5 to 6 cigarettes a day.   Current Meds  Medication Sig  . albuterol (PROAIR HFA) 108 (90 Base) MCG/ACT inhaler 1-2 inhalations every 4-6 hours as needed for cough or wheeze. (Patient taking differently: Inhale 1-2 puffs into the lungs every 4 (four) hours as needed for shortness of breath. )  . ALPRAZolam (XANAX) 0.5 MG tablet Take 0.25 mg by mouth daily as needed for anxiety or sleep.   Marland Kitchen amLODipine (NORVASC) 5 MG tablet Take 1 tablet (5 mg total) by mouth daily.  Marland Kitchen aspirin EC 81 MG tablet Take 81 mg by mouth daily.  . Azelastine HCl 0.15 % SOLN Place 2 sprays into both nostrils 2 (two) times daily. (Patient taking differently: Place 2 sprays into both nostrils 2 (two) times daily as needed (allergies). )  . citalopram (CELEXA) 10 MG tablet Take 10 mg by mouth daily.  . clopidogrel (PLAVIX) 75 MG tablet Take 1 tablet (75 mg total) by mouth daily with supper. Patient is due for yearly appt for more refills, thank you! (Reminder)  .  cyclobenzaprine (FLEXERIL) 10 MG tablet Take 10 mg by mouth 3 (three) times daily as needed for muscle spasms.  Marland Kitchen EPINEPHrine 0.3 mg/0.3 mL IJ SOAJ injection Inject 0.3 mLs (0.3 mg total) into the muscle once.  . Fenofibric Acid 105 MG TABS Take 1 tablet by mouth daily.  Marland Kitchen HYDROcodone-acetaminophen (NORCO/VICODIN) 5-325 MG tablet Take 1 tablet by mouth 2 (two) times daily as needed. for pain  . insulin lispro (HUMALOG)  100 UNIT/ML injection Inject 5-12 Units into the skin 2 (two) times daily before a meal. Per sliding scale  . isosorbide mononitrate (IMDUR) 30 MG 24 hr tablet Take 1 tablet (30 mg total) by mouth daily.  Marland Kitchen loratadine (CLARITIN) 10 MG tablet Take 10 mg by mouth daily as needed for allergies.  Marland Kitchen meclizine (ANTIVERT) 25 MG tablet Take 1 tablet by mouth 3 (three) times daily as needed for dizziness.  . mupirocin cream (BACTROBAN) 2 % Apply 1 application topically 2 (two) times daily as needed (wound care (boils)).   . nitroGLYCERIN (NITROSTAT) 0.4 MG SL tablet PLACE 1 TAB UNDER TONGUE EVERY 5 MINS AS NEEDED FOR CHEST PAIN - MAX 3 DOSES THEN 911  . pantoprazole (PROTONIX) 40 MG tablet TAKE 1 TABLET BY MOUTH EVERY DAY  . propranolol ER (INDERAL LA) 60 MG 24 hr capsule Take 1 capsule (60 mg total) by mouth every evening.  . rosuvastatin (CRESTOR) 40 MG tablet Take 40 mg by mouth daily.  . SUMAtriptan (IMITREX) 50 MG tablet Take 50 mg by mouth every 2 (two) hours as needed for migraine.  . temazepam (RESTORIL) 30 MG capsule Take 30 mg by mouth at bedtime.     Allergies  Allergen Reactions  . Sulfa Antibiotics Shortness Of Breath and Palpitations  . 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 Fish Allergy Hives and Swelling    Tongue swelling  . Iodine Swelling  . Metformin And Related Diarrhea  . [name] [Insulin Glargine-Lixisenatide] Itching and Other (See Comments)    "tongue swelling"  . Adhesive [Tape] Rash    Paper tape is ok  . Shellfish Allergy Swelling and Rash    Tongue swelling    Social History   Socioeconomic History  . Marital status: Divorced    Spouse name: Not on file  . Number of children: 1  . Years of education: Not on file  . Highest education level: Not on file  Occupational History    Employer: UNEMPLOYED  Social Needs  . Financial resource strain: Not on file  . Food insecurity:    Worry: Not on file    Inability:  Not on file  . Transportation needs:    Medical: Not on file    Non-medical: Not on file  Tobacco Use  . Smoking status: Former Smoker    Packs/day: 0.33    Years: 41.00    Pack years: 13.53    Types: Cigarettes    Last attempt to quit: 01/15/2016    Years since quitting: 2.1  . Smokeless tobacco: Never Used  . Tobacco comment: Getting ready to start nicotine patches RX by Dr. Gwenlyn Found per pt.  Substance and Sexual Activity  . Alcohol use: No    Alcohol/week: 0.0 standard drinks  . Drug use: No    Types: "Crack" cocaine    Comment: 05/09/2015.  "quit 07/27/1994"  . Sexual activity: Not Currently    Birth control/protection: Abstinence  Lifestyle  . Physical activity:  Days per week: Not on file    Minutes per session: Not on file  . Stress: Not on file  Relationships  . Social connections:    Talks on phone: Not on file    Gets together: Not on file    Attends religious service: Not on file    Active member of club or organization: Not on file    Attends meetings of clubs or organizations: Not on file    Relationship status: Not on file  . Intimate partner violence:    Fear of current or ex partner: Not on file    Emotionally abused: Not on file    Physically abused: Not on file    Forced sexual activity: Not on file  Other Topics Concern  . 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  Divorced, living alone in [place].       Review of Systems: General: negative for chills, fever, night sweats or weight changes.  Cardiovascular: negative for chest pain, dyspnea on exertion, edema, orthopnea, palpitations, paroxysmal nocturnal dyspnea or shortness of breath Dermatological: negative for rash Respiratory: negative for cough or wheezing Urologic: negative for hematuria Abdominal: negative for nausea, vomiting, diarrhea, bright red blood per rectum,  melena, or hematemesis Neurologic: negative for visual changes, syncope, or dizziness All other systems reviewed and are otherwise negative except as noted above.    Blood pressure (!) 158/94, pulse 95, height 5\' 4"  (1.626 m), weight 150 lb (68 kg).  General appearance: alert and no distress Neck: no adenopathy, no carotid bruit, no JVD, supple, symmetrical, trachea midline and thyroid not enlarged, symmetric, no tenderness/mass/nodules Lungs: clear to auscultation bilaterally Heart: regular rate and rhythm, S1, S2 normal, no murmur, click, rub or gallop Extremities: extremities normal, atraumatic, no cyanosis or edema Pulses: 2+ and symmetric Skin: Skin color, texture, turgor normal. No rashes or lesions Neurologic: Alert and oriented X 3, normal strength and tone. Normal symmetric reflexes. Normal coordination and gait  EKG sinus rhythm at 95 with septal Q waves.  I personally reviewed this EKG.  ASSESSMENT AND PLAN:   Essential hypertension History of essential hypertension her blood pressure measured today at 162/98.  She is on amlodipine and Inderal LA.  Her serum creatinine is in the 2 range and therefore she is not a candidate for ACE/ARB.  S/P femoral-popliteal bypass surgery History of peripheral arterial disease status post [facility] 1 directional atherectomy and Viabahn covered stenting 11/03/2014 for critical limb ischemia.  Because of recurrent symptoms and worsening Dopplers was re-angiogram revealing an occluded stent with two-vessel runoff.  She ultimately required left femoropopliteal bypass grafting by Dr. Trula Slade 06/09/2016 with PTFE.  He follows this noninvasively.  He denies claudication.  Hyperlipidemia LDL goal <70 History of hyperlipidemia on high-dose statin therapy with lipid profile performed 07/16/2017 revealing a total cholesterol of 105.  Mild CAD History of minimal CAD by cardiac catheterization performed by Dr. [name] in the setting of chest pain  12/26/2016.      Lorretta Harp MD FACP,FACC,FAHA, [facility] [place] 02/25/2018 4:49 PM

## 2018-02-25 NOTE — Assessment & Plan Note (Signed)
History of peripheral arterial disease status post [facility] 1 directional atherectomy and Viabahn covered stenting 11/03/2014 for critical limb ischemia.  Because of recurrent symptoms and worsening Dopplers was re-angiogram revealing an occluded stent with two-vessel runoff.  She ultimately required left femoropopliteal bypass grafting by Dr. Trula Slade 06/09/2016 with PTFE.  He follows this noninvasively.  He denies claudication.

## 2018-02-26 ENCOUNTER — Other Ambulatory Visit: Payer: Self-pay | Admitting: Cardiovascular Disease

## 2018-02-27 DIAGNOSIS — E1140 Type 2 diabetes mellitus with diabetic neuropathy, unspecified: Secondary | ICD-10-CM | POA: Diagnosis not present

## 2018-02-27 DIAGNOSIS — E1121 Type 2 diabetes mellitus with diabetic nephropathy: Secondary | ICD-10-CM | POA: Diagnosis not present

## 2018-02-27 DIAGNOSIS — E782 Mixed hyperlipidemia: Secondary | ICD-10-CM | POA: Diagnosis not present

## 2018-02-27 DIAGNOSIS — I10 Essential (primary) hypertension: Secondary | ICD-10-CM | POA: Diagnosis not present

## 2018-03-02 NOTE — Addendum Note (Signed)
Addended by: Cristopher Estimable on: 03/02/2018 07:34 AM   Modules accepted: Orders

## 2018-03-12 DIAGNOSIS — E782 Mixed hyperlipidemia: Secondary | ICD-10-CM | POA: Diagnosis not present

## 2018-03-12 DIAGNOSIS — E1140 Type 2 diabetes mellitus with diabetic neuropathy, unspecified: Secondary | ICD-10-CM | POA: Diagnosis not present

## 2018-03-12 DIAGNOSIS — I10 Essential (primary) hypertension: Secondary | ICD-10-CM | POA: Diagnosis not present

## 2018-03-12 DIAGNOSIS — E1121 Type 2 diabetes mellitus with diabetic nephropathy: Secondary | ICD-10-CM | POA: Diagnosis not present

## 2018-03-16 DIAGNOSIS — I10 Essential (primary) hypertension: Secondary | ICD-10-CM | POA: Diagnosis not present

## 2018-03-16 DIAGNOSIS — E1140 Type 2 diabetes mellitus with diabetic neuropathy, unspecified: Secondary | ICD-10-CM | POA: Diagnosis not present

## 2018-03-16 DIAGNOSIS — I739 Peripheral vascular disease, unspecified: Secondary | ICD-10-CM | POA: Diagnosis not present

## 2018-03-16 DIAGNOSIS — G4700 Insomnia, unspecified: Secondary | ICD-10-CM | POA: Diagnosis not present

## 2018-03-16 DIAGNOSIS — E782 Mixed hyperlipidemia: Secondary | ICD-10-CM | POA: Diagnosis not present

## 2018-03-27 DIAGNOSIS — N644 Mastodynia: Secondary | ICD-10-CM | POA: Diagnosis not present

## 2018-03-27 DIAGNOSIS — E1140 Type 2 diabetes mellitus with diabetic neuropathy, unspecified: Secondary | ICD-10-CM | POA: Diagnosis not present

## 2018-03-27 DIAGNOSIS — I10 Essential (primary) hypertension: Secondary | ICD-10-CM | POA: Diagnosis not present

## 2018-04-03 ENCOUNTER — Other Ambulatory Visit: Payer: Self-pay

## 2018-04-03 NOTE — Patient Outreach (Signed)
[place] [facility]) Care Management  04/03/2018  [name] 08/23/1955 [id number]   Medication Adherence call to Mrs. [name] left a message for patient to call back patient is due on Rosuvastatin 40 mg. Mrs. [name] is showing past due under [place].    [place] Management Direct Dial [phone]  Fax [phone] [email or web address]

## 2018-04-06 ENCOUNTER — Other Ambulatory Visit: Payer: Self-pay

## 2018-04-06 NOTE — Patient Outreach (Signed)
[place] [facility]) Care Management  04/06/2018  [name] 03-Sep-1955 [id number]   Medication Adherence call to [name] patient call back she ask if we can call [facility] and order Rosuvastatin 40 mg and will pick up once [facility] calls her she has medication for seven more days. [name] is showing past due under [place].   [place] Management Direct Dial [phone]  Fax [phone] [email or web address]

## 2018-04-13 ENCOUNTER — Other Ambulatory Visit (HOSPITAL_COMMUNITY): Payer: Self-pay | Admitting: Internal Medicine

## 2018-04-13 DIAGNOSIS — N644 Mastodynia: Secondary | ICD-10-CM

## 2018-04-14 DIAGNOSIS — N644 Mastodynia: Secondary | ICD-10-CM | POA: Diagnosis not present

## 2018-04-14 DIAGNOSIS — J060 Acute laryngopharyngitis: Secondary | ICD-10-CM | POA: Diagnosis not present

## 2018-04-14 DIAGNOSIS — H6123 Impacted cerumen, bilateral: Secondary | ICD-10-CM | POA: Diagnosis not present

## 2018-04-15 ENCOUNTER — Other Ambulatory Visit (HOSPITAL_COMMUNITY): Payer: Self-pay | Admitting: Internal Medicine

## 2018-04-15 DIAGNOSIS — N644 Mastodynia: Secondary | ICD-10-CM

## 2018-04-20 ENCOUNTER — Other Ambulatory Visit (HOSPITAL_COMMUNITY): Payer: Self-pay | Admitting: Internal Medicine

## 2018-04-20 DIAGNOSIS — N644 Mastodynia: Secondary | ICD-10-CM

## 2018-04-21 ENCOUNTER — Ambulatory Visit (HOSPITAL_COMMUNITY)
Admission: RE | Admit: 2018-04-21 | Discharge: 2018-04-21 | Disposition: A | Discharge status: Home / Self Care | Payer: Medicare Other | Source: Ambulatory Visit | Attending: Internal Medicine | Admitting: Internal Medicine

## 2018-04-21 DIAGNOSIS — N644 Mastodynia: Secondary | ICD-10-CM

## 2018-04-21 DIAGNOSIS — R921 Mammographic calcification found on diagnostic imaging of breast: Secondary | ICD-10-CM | POA: Diagnosis not present

## 2018-04-22 ENCOUNTER — Ambulatory Visit: Payer: Self-pay | Admitting: Orthopedic Surgery

## 2018-05-05 ENCOUNTER — Encounter (HOSPITAL_COMMUNITY): Payer: Medicare Other

## 2018-05-05 ENCOUNTER — Other Ambulatory Visit (HOSPITAL_COMMUNITY): Payer: Medicare Other

## 2018-07-23 DIAGNOSIS — I10 Essential (primary) hypertension: Secondary | ICD-10-CM | POA: Diagnosis not present

## 2018-07-23 DIAGNOSIS — E1121 Type 2 diabetes mellitus with diabetic nephropathy: Secondary | ICD-10-CM | POA: Diagnosis not present

## 2018-07-23 DIAGNOSIS — E1140 Type 2 diabetes mellitus with diabetic neuropathy, unspecified: Secondary | ICD-10-CM | POA: Diagnosis not present

## 2018-07-23 DIAGNOSIS — E782 Mixed hyperlipidemia: Secondary | ICD-10-CM | POA: Diagnosis not present

## 2018-07-29 DIAGNOSIS — E1165 Type 2 diabetes mellitus with hyperglycemia: Secondary | ICD-10-CM | POA: Diagnosis not present

## 2018-07-29 DIAGNOSIS — E1140 Type 2 diabetes mellitus with diabetic neuropathy, unspecified: Secondary | ICD-10-CM | POA: Diagnosis not present

## 2018-07-29 DIAGNOSIS — I739 Peripheral vascular disease, unspecified: Secondary | ICD-10-CM | POA: Diagnosis not present

## 2018-07-29 DIAGNOSIS — G4700 Insomnia, unspecified: Secondary | ICD-10-CM | POA: Diagnosis not present

## 2018-07-29 DIAGNOSIS — E1151 Type 2 diabetes mellitus with diabetic peripheral angiopathy without gangrene: Secondary | ICD-10-CM | POA: Diagnosis not present

## 2018-08-07 ENCOUNTER — Other Ambulatory Visit: Payer: Self-pay | Admitting: Surgery

## 2018-08-07 DIAGNOSIS — I739 Peripheral vascular disease, unspecified: Secondary | ICD-10-CM

## 2018-08-07 DIAGNOSIS — Z95828 Presence of other vascular implants and grafts: Secondary | ICD-10-CM

## 2018-08-13 ENCOUNTER — Ambulatory Visit: Payer: Medicare Other

## 2018-08-13 ENCOUNTER — Encounter (HOSPITAL_COMMUNITY): Payer: Medicare Other

## 2018-08-13 ENCOUNTER — Other Ambulatory Visit (HOSPITAL_COMMUNITY): Payer: Medicare Other

## 2018-08-13 ENCOUNTER — Ambulatory Visit: Payer: Medicare Other | Admitting: Family

## 2018-08-24 ENCOUNTER — Telehealth: Payer: Self-pay | Admitting: Internal Medicine

## 2018-08-24 NOTE — Telephone Encounter (Signed)
Patient instructed to maintain an anti-reflux diet. Advised to avoid caffeine, mint, citrus foods/juices, tomatoes,  chocolate, NSAIDS/ASA products.  Instructed not to eat within 2 hours of exercise or bed, multiple small meals are better than 3 large meals.  Need to take PPI 30 minutes prior to 1st meal of the day and the second at HS.  She will add Pepcid OTC BID and if needed gaviscon.  She is asked to call back for continued symptoms.

## 2018-09-07 DIAGNOSIS — Z0000 Encounter for general adult medical examination without abnormal findings: Secondary | ICD-10-CM | POA: Diagnosis not present

## 2018-09-30 DIAGNOSIS — M25551 Pain in right hip: Secondary | ICD-10-CM | POA: Diagnosis not present

## 2018-09-30 DIAGNOSIS — E1165 Type 2 diabetes mellitus with hyperglycemia: Secondary | ICD-10-CM | POA: Diagnosis not present

## 2018-10-05 DIAGNOSIS — M25551 Pain in right hip: Secondary | ICD-10-CM | POA: Diagnosis not present

## 2018-10-05 DIAGNOSIS — M545 Low back pain: Secondary | ICD-10-CM | POA: Diagnosis not present

## 2018-10-12 DIAGNOSIS — M25559 Pain in unspecified hip: Secondary | ICD-10-CM | POA: Diagnosis not present

## 2018-10-14 DIAGNOSIS — M25551 Pain in right hip: Secondary | ICD-10-CM | POA: Diagnosis not present

## 2018-11-02 DIAGNOSIS — M25551 Pain in right hip: Secondary | ICD-10-CM | POA: Diagnosis not present

## 2018-11-02 DIAGNOSIS — M25552 Pain in left hip: Secondary | ICD-10-CM | POA: Diagnosis not present

## 2018-11-20 ENCOUNTER — Encounter: Payer: Self-pay | Admitting: Family

## 2018-11-20 ENCOUNTER — Ambulatory Visit (INDEPENDENT_AMBULATORY_CARE_PROVIDER_SITE_OTHER)
Admission: RE | Admit: 2018-11-20 | Discharge: 2018-11-20 | Disposition: A | Discharge status: Home / Self Care | Payer: Medicare Other | Source: Ambulatory Visit | Attending: Surgery | Admitting: Surgery

## 2018-11-20 ENCOUNTER — Ambulatory Visit (INDEPENDENT_AMBULATORY_CARE_PROVIDER_SITE_OTHER): Payer: Medicare Other | Admitting: Family

## 2018-11-20 ENCOUNTER — Other Ambulatory Visit: Payer: Self-pay

## 2018-11-20 ENCOUNTER — Ambulatory Visit (HOSPITAL_COMMUNITY)
Admission: RE | Admit: 2018-11-20 | Discharge: 2018-11-20 | Disposition: A | Discharge status: Home / Self Care | Payer: Medicare Other | Source: Ambulatory Visit | Attending: Surgery | Admitting: Surgery

## 2018-11-20 VITALS — BP 139/88 | HR 80 | Temp 97.6°F | Resp 14 | Ht 67.0 in | Wt 141.5 lb

## 2018-11-20 DIAGNOSIS — F17200 Nicotine dependence, unspecified, uncomplicated: Secondary | ICD-10-CM | POA: Diagnosis not present

## 2018-11-20 DIAGNOSIS — Z95828 Presence of other vascular implants and grafts: Secondary | ICD-10-CM

## 2018-11-20 DIAGNOSIS — I739 Peripheral vascular disease, unspecified: Secondary | ICD-10-CM | POA: Diagnosis not present

## 2018-11-20 DIAGNOSIS — I779 Disorder of arteries and arterioles, unspecified: Secondary | ICD-10-CM | POA: Diagnosis not present

## 2018-11-20 NOTE — Progress Notes (Signed)
[facility] [place]   CC: Follow up peripheral artery occlusive disease  History of Present Illness [name] is a 63 y.o. female who is status post left femoral to popliteal artery bypass graft with PTFE on 06/09/2016 by Dr. Trula Slade for lifestyle limiting claudication. She had previously undergone atherectomy and angioplasty by Dr. Gwenlyn Found.  Dr. Trula Slade last evaluated pt on 04-02-18. At that time duplex showed a left ABI of .95 and no stenosis within the graft S/P left fem AK pop BPG with PTFE. She was asx. Duplex showed BPG to be widely patent. Dr. Trula Slade indicated that he will need to monitor the velocities within the graft as they are on the low side. She was to follow up in 6 months with a duplex.  She does not seem to indicate claudication sx's with walking, c/o bilateral hip pain, right worse than left; states she still has some mild numbness in her left calf.  She denies sores or non healing wounds.   She has had 3 strokes, that last one in 2011, indicates that she has mild hemiparesis and hemiplegia in her left upper and lower extremities.   Serum creatinine was 2.08 on 08-05-17, GFR 31, stage 3b CKD.   Diabetic: Yes, last A1C result on file was 7.3 on 06-19-16; however, pt states her last A1C was 7 or 8, and is working with her PCP re this.  Tobacco use: smoker  (1/2 ppd x 41 yrs); she had quit for a year, resumed in January 2019 which she relates to the death of a couple of family members and other stress.   Pt meds include: Statin :Yes Betablocker: no ASA: Yes Other anticoagulants/antiplatelets: Plavix   Past Medical History:  Diagnosis Date  . 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 Anemia    hx  . Angioedema 07/10/2015  . Anxiety   . Arthritis    "neck, left hand" (09/14/2012)  . Asthma   . Cancer ([place]) 1985   ovarian, no treatment except surgery  . Complication of anesthesia    OCCASIONAL TROUBLE TURNING NECK TO RIGHT  . Critical lower  limb ischemia    10/2014 s/p L SFA stenting  . Diverticulosis of colon with hemorrhage April 2013  . GERD (gastroesophageal reflux disease)   . H/O hiatal hernia   . Heart attack [facility])    2003 mild MI, March 2013 mild MI  . Hidradenitis    groin  . History of blood transfusion 1985 AND 2013  . Hyperlipidemia   . Hypertension   . Irritable bowel syndrome   . Left-sided weakness    since stroke, left eye trouble seeing  . Migraines   . Mild CAD    a. Cath 09/2010: mild luminal irregularities of LAD, 30% prox RCA and 20-30% mRCA, EF 65%.  . Neuropathy   . Obesity   . PAD (peripheral artery disease) ([place])    a. critical limb ischemia s/p PTA/stenting of L SFA 10/2014. c. occ prior SFA stent by angio 01/2016, for possible PV bypass.  . Pneumonia    baby  . Recurrent upper respiratory infection (URI)   . S/P arterial stent-mid Lt SFA 11/03/14 11/04/2014  . Schatzki's ring   . Sinus problem   . Stomach ulcer 1972   non-bleeding  . Stroke ([place]) 07-2007, 07-2008, 07-2009   total 3 strokes; mild left sided weakness and left eye "jumps".  . Type II diabetes mellitus ([place])   . Vitamin B 12 deficiency 07-15-2013  Social History Social History   Tobacco Use  . Smoking status: Former Smoker    Packs/day: 0.33    Years: 41.00    Pack years: 13.53    Types: Cigarettes    Quit date: 01/15/2016    Years since quitting: 2.8  . Smokeless tobacco: Never Used  . Tobacco comment: Getting ready to start nicotine patches RX by Dr. Gwenlyn Found per pt.  Substance Use Topics  . Alcohol use: No    Alcohol/week: 0.0 standard drinks  . Drug use: No    Types: "Crack" cocaine    Comment: 05/09/2015.  "quit 07/27/1994"    Family History Family History  Problem Relation Age of Onset  . Breast cancer Mother   . Heart disease Mother   . Hypertension Mother   . Diabetes Father   . Heart disease Father   . Stroke Father   . Heart disease Sister   . Hypertension Sister   . Hypertension Sister   .  Hyperlipidemia Sister   . Diabetes Sister   . Allergic rhinitis Sister   . Emphysema Unknown        great uncle  . Aneurysm Sister        brain  . Angioedema Neg Hx   . Asthma Neg Hx   . Eczema Neg Hx   . Immunodeficiency Neg Hx   . Urticaria Neg Hx     Past Surgical History:  Procedure Laterality Date  . ABDOMINAL HYSTERECTOMY  1985  . ADENOIDECTOMY    . ANTERIOR CERVICAL DECOMP/DISCECTOMY FUSION  2002  . ANTERIOR CERVICAL DECOMP/DISCECTOMY FUSION N/A 04/08/2014   Procedure: Cervical Six-Seven ANTERIOR CERVICAL DECOMPRESSION/DISCECTOMY FUSION Plating and Bonegraft  2 LEVELS;  Surgeon: Ashok Pall, MD;  Location: [place] [facility];  Service: Neurosurgery;  Laterality: N/A;  Cervical Six-Seven ANTERIOR CERVICAL DECOMPRESSION/DISCECTOMY FUSION Plating and Bonegraft  2 LEVELS  . APPENDECTOMY  1985  . AXILLARY HIDRADENITIS EXCISION  1990-2008   bilateral  . BACK SURGERY    . BREAST BIOPSY Right 2007  . BREAST CYST EXCISION Right 2008  . BREAST REDUCTION SURGERY    . CARDIAC CATHETERIZATION  2004   mild disease  . CATARACT EXTRACTION W/PHACO Left 05/16/2015   Procedure: CATARACT EXTRACTION PHACO AND INTRAOCULAR LENS PLACEMENT (IOC);  Surgeon: Rutherford Guys, MD;  Location: [facility];  Service: Ophthalmology;  Laterality: Left;  CDE: 4.24  . CATARACT EXTRACTION W/PHACO Right 05/30/2015   Procedure: CATARACT EXTRACTION RIGHT EYE PHACO AND INTRAOCULAR LENS PLACEMENT ;  Surgeon: Rutherford Guys, MD;  Location: [facility];  Service: Ophthalmology;  Laterality: Right;  CDE:4.08  . CHOLECYSTECTOMY  1990's  . COLONOSCOPY  08/12/2011   Procedure: COLONOSCOPY;  Surgeon: Ladene Artist, MD,FACG;  Location[facility];  Service: Endoscopy;  Laterality: N/A;  . COLONOSCOPY  06/19/2006  . cyst thigh Right   . ESOPHAGOGASTRODUODENOSCOPY  08/12/2011   Procedure: ESOPHAGOGASTRODUODENOSCOPY (EGD);  Surgeon: Ladene Artist, MD,FACG;  Location: [facility] ENDOSCOPY;  Service: Endoscopy;  Laterality: N/A;  .  ESOPHAGOGASTRODUODENOSCOPY  06/04/2005  . FEMORAL-POPLITEAL BYPASS GRAFT Left 06/19/2016   Procedure: Left Leg BYPASS GRAFT FEMORAL-POPLITEAL ARTERY;  Surgeon: Serafina Mitchell, MD;  Location: [place];  Service: Vascular;  Laterality: Left;  . GIVENS CAPSULE STUDY  08/13/2011   Procedure: GIVENS CAPSULE STUDY;  Surgeon: Ladene Artist, MD,FACG;  Location: [facility] ENDOSCOPY;  Service: Endoscopy;  Laterality: N/A;  . HAMMER TOE SURGERY Bilateral ~ 2000  . HIATAL HERNIA REPAIR    . HYDRADENITIS EXCISION  01/2011; 03/2012   '  groin and abdomen; 03/2012" (09/14/2012)  . HYDRADENITIS EXCISION  04/01/2012   Procedure: EXCISION HYDRADENITIS GROIN;  Surgeon: Pedro Earls, MD;  Location: [facility];  Service: General;  Laterality: Bilateral;  Excision of Hydradenitis of Perineum  . HYDRADENITIS EXCISION N/A 09/17/2013   Procedure: EXCISION PERINEAL HIDRADENITIS ;  Surgeon: Pedro Earls, MD;  Location: [facility];  Service: General;  Laterality: N/A;  also in the pubis area  . LEFT HEART CATH AND CORONARY ANGIOGRAPHY N/A 12/26/2016   Procedure: LEFT HEART CATH AND CORONARY ANGIOGRAPHY;  Surgeon: [name], Peter M, MD;  Location: [place] CV LAB;  Service: Cardiovascular;  Laterality: N/A;  . MASS EXCISION Right 09/17/2013   Procedure: EXCISION MASS;  Surgeon: Pedro Earls, MD;  Location: [facility];  Service: General;  Laterality: Right;  . NISSEN FUNDOPLICATION  0712  . PERIPHERAL VASCULAR CATHETERIZATION N/A 11/03/2014   Procedure: Lower Extremity Angiography;  Surgeon: Lorretta Harp, MD;  Location: [place] CV LAB;  Service: Cardiovascular;  Laterality: N/A;  . PERIPHERAL VASCULAR CATHETERIZATION N/A 01/29/2016   Procedure: Lower Extremity Angiography;  Surgeon: Lorretta Harp, MD;  Location: [place] CV LAB;  Service: Cardiovascular;  Laterality: N/A;  . POSTERIOR LUMBAR FUSION  2008 X 2  . REDUCTION MAMMAPLASTY  1996?  . TONSILLECTOMY AND ADENOIDECTOMY  1959 AND 2000  . UVULOPALATOPHARYNGOPLASTY,  TONSILLECTOMY AND SEPTOPLASTY  2000's    Allergies  Allergen Reactions  . Sulfa Antibiotics Shortness Of Breath and Palpitations  . 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 Fish Allergy Hives and Swelling    Tongue swelling  . Iodine Swelling  . Metformin And Related Diarrhea  . [name] [Insulin Glargine-Lixisenatide] Itching and Other (See Comments)    "tongue swelling"  . Adhesive [Tape] Rash    Paper tape is ok  . Shellfish Allergy Swelling and Rash    Tongue swelling    Current Outpatient Medications  Medication Sig Dispense Refill  . albuterol (PROAIR HFA) 108 (90 Base) MCG/ACT inhaler 1-2 inhalations every 4-6 hours as needed for cough or wheeze. (Patient taking differently: Inhale 1-2 puffs into the lungs every 4 (four) hours as needed for shortness of breath. ) 1 Inhaler 1  . ALPRAZolam (XANAX) 0.5 MG tablet Take 0.25 mg by mouth daily as needed for anxiety or sleep.     Marland Kitchen amLODipine (NORVASC) 5 MG tablet Take 1 tablet (5 mg total) by mouth daily. 30 tablet 11  . aspirin EC 81 MG tablet Take 81 mg by mouth daily.    . Azelastine HCl 0.15 % SOLN Place 2 sprays into both nostrils 2 (two) times daily. (Patient taking differently: Place 2 sprays into both nostrils 2 (two) times daily as needed (allergies). ) 30 mL 5  . citalopram (CELEXA) 10 MG tablet Take 10 mg by mouth daily.    . clopidogrel (PLAVIX) 75 MG tablet TAKE 1 TABLET BY MOUTH EVERY DAY WITH SUPPER - NEEDS OFFICE VISIT 30 tablet 11  . cyclobenzaprine (FLEXERIL) 10 MG tablet Take 10 mg by mouth 3 (three) times daily as needed for muscle spasms.    Marland Kitchen EPINEPHrine 0.3 mg/0.3 mL IJ SOAJ injection Inject 0.3 mLs (0.3 mg total) into the muscle once. 1 Device 1  . Fenofibric Acid 105 MG TABS Take 1 tablet by mouth daily.    . insulin lispro (HUMALOG) 100 UNIT/ML injection Inject 5-12 Units into the skin 2 (two) times daily before a meal.  Per sliding scale    . isosorbide mononitrate  (IMDUR) 30 MG 24 hr tablet Take 1 tablet (30 mg total) by mouth daily. 30 tablet 5  . loratadine (CLARITIN) 10 MG tablet Take 10 mg by mouth daily as needed for allergies.    Marland Kitchen meclizine (ANTIVERT) 25 MG tablet Take 1 tablet by mouth 3 (three) times daily as needed for dizziness.  3  . mupirocin cream (BACTROBAN) 2 % Apply 1 application topically 2 (two) times daily as needed (wound care (boils)).     . nitroGLYCERIN (NITROSTAT) 0.4 MG SL tablet PLACE 1 TAB UNDER TONGUE EVERY 5 MINS AS NEEDED FOR CHEST PAIN - MAX 3 DOSES THEN 911 25 tablet 2  . pantoprazole (PROTONIX) 40 MG tablet TAKE 1 TABLET BY MOUTH EVERY DAY 30 tablet 11  . propranolol ER (INDERAL LA) 60 MG 24 hr capsule Take 1 capsule (60 mg total) by mouth every evening. 30 capsule 5  . rosuvastatin (CRESTOR) 40 MG tablet Take 40 mg by mouth daily.    . SUMAtriptan (IMITREX) 50 MG tablet Take 50 mg by mouth every 2 (two) hours as needed for migraine.    . temazepam (RESTORIL) 30 MG capsule Take 30 mg by mouth at bedtime.     No current facility-administered medications for this visit.     ROS: See HPI for pertinent positives and negatives.   Physical Examination  Vitals:   11/20/18 1346  BP: 139/88  Pulse: 80  Resp: 14  Temp: 97.6 F (36.4 C)  TempSrc: Temporal  SpO2: 98%  Weight: 141 lb 8 oz (64.2 kg)  Height: 5\' 7"  (1.702 m)   Body mass index is 22.16 kg/m.  General: A&O x 3, WDWN, female. Gait: normal HENT: No gross abnormalities.  Eyes: PERRLA. Pulmonary: Respirations are non labored, CTAB, good air movement in all fields Cardiac: regular rhythm, no detected murmur.         Carotid Bruits Right Left   Negative Negative   Radial pulses are 2+ palpable bilaterally   Adominal aortic pulse is not palpable                         VASCULAR EXAM: Extremities without ischemic changes, without Gangrene; without open wounds.                                                                                                           LE Pulses Right Left       FEMORAL  2+ palpable  1+ palpable        POPLITEAL  not palpable   not palpable       POSTERIOR TIBIAL  not palpable   not palpable        DORSALIS PEDIS      ANTERIOR TIBIAL 1+ palpable  not palpable    Abdomen: soft, NT, no palpable masses. Skin: no rashes, no cellulitis, no ulcers noted. Musculoskeletal: no muscle wasting or atrophy.  Neurologic: A&O X 3; appropriate affect, Sensation is normal; MOTOR FUNCTION:  moving all extremities equally, motor strength 5/5 throughout. Speech is fluent/normal. CN 2-12 intact. Psychiatric: Thought content is normal, mood appropriate for clinical situation.    DATA  Left LE arterial Duplex  (11-20-18): +----+--------+-----+--------+--------+--------+ LEFTPSV cm/sRatioStenosisWaveformComments +----+--------+-----+--------+--------+--------+ DFA 96                                    +----+--------+-----+--------+--------+--------+    Left Graft #1: fem-AK pop +--------------------+--------+--------+---------+--------+                     PSV cm/sStenosisWaveform Comments +--------------------+--------+--------+---------+--------+ Inflow              184             biphasic          +--------------------+--------+--------+---------+--------+ Proximal Anastomosis205             triphasic         +--------------------+--------+--------+---------+--------+ Proximal Graft      63              biphasic          +--------------------+--------+--------+---------+--------+ Mid Graft           52              biphasic          +--------------------+--------+--------+---------+--------+ Distal Graft        63              biphasic          +--------------------+--------+--------+---------+--------+ Distal Anastamosis  106             biphasic          +--------------------+--------+--------+---------+--------+ Outflow             96               biphasic          +--------------------+--------+--------+---------+--------+  Summary: Left Graft(s): Fem-AK pop bypass graft patent with no evidence of stenosis noted.   ABI Findings (11-20-18): +---------+------------------+-----+----------+--------+ Right    Rt Pressure (mmHg)IndexWaveform  Comment  +---------+------------------+-----+----------+--------+ Brachial 138                                       +---------+------------------+-----+----------+--------+ ATA      137               0.94 triphasic          +---------+------------------+-----+----------+--------+ PTA      114               0.78 monophasic         +---------+------------------+-----+----------+--------+ Great Toe87                0.60                    +---------+------------------+-----+----------+--------+  +---------+------------------+-----+---------+-------+ Left     Lt Pressure (mmHg)IndexWaveform Comment +---------+------------------+-----+---------+-------+ Brachial 146                                     +---------+------------------+-----+---------+-------+ ATA      154               1.05 triphasic        +---------+------------------+-----+---------+-------+ PTA  157               1.08 triphasic        +---------+------------------+-----+---------+-------+ Great Toe100               0.68                  +---------+------------------+-----+---------+-------+  +-------+-----------+-----------+------------+------------+ ABI/TBIToday's ABIToday's TBIPrevious ABIPrevious TBI +-------+-----------+-----------+------------+------------+ Right  0.94       0.6        0.99        0.58         +-------+-----------+-----------+------------+------------+ Left   1.08       0.68       1.03        0.61         +-------+-----------+-----------+------------+------------+ Summary: Right: Resting right ankle-brachial index  is within normal range. The right toe-brachial index is abnormal. RT great toe pressure = 87 mmHg.  Left: Resting left ankle-brachial index is within normal range. The left toe-brachial index is abnormal. LT Great toe pressure = 100 mmHg.   ASSESSMENT: [name] is a 63 y.o. female who is status post left femoral to popliteal artery bypass graft with PTFE on 06/09/2016 by Dr. Trula Slade for lifestyle limiting claudication.   She no longer seems to have claudication sx's with walking, there are no signs of ischemia in her feet or legs.  Today's left LE arterial duplex demonstrates Fem-AK pop bypass graft patent with no evidence of stenosis. ABI's today show mild decline in the right from normal to mild disease, mono and triphasic waveforms, the left LE arterial perfusion remains normal with triphasic waveforms   She has mild residual left hemiparesis/hemiplegia since her 2011 stroke.  She has seen ortho re her bilateral hip pain.   Pt states she would like [place] to continue to monitor her peripheral artery disease instead of Dr. Gwenlyn Found.   Her atherosclerotic risk factors include smoking x 44 years (current), uncontrolled DM, and CKD stage 3b. She takes a daily ASA, Plavix, and a statin.  She has increased her walking.    PLAN:  Based on the patient's vascular studies and examination, pt will return to clinic in 9 months with left LE arterial duplex and ABI's.  I advised her to notify us if she develops concerns re the circulation in her feet or legs.   Over 3 minutes was spent counseling patient re smoking cessation, and patient was given several free resources re smoking cessation.  I discussed in depth with the patient the nature of atherosclerosis, and emphasized the importance of maximal medical management including strict control of blood pressure, blood glucose, and lipid levels, obtaining regular exercise, and cessation of smoking.  The patient is aware that without maximal  medical management the underlying atherosclerotic disease process will progress, limiting the benefit of any interventions.  The patient was given information about PAD including signs, symptoms, treatment, what symptoms should prompt the patient to seek immediate medical care, and risk reduction measures to take.  Clemon Chambers, RN, MSN, FNP-C [facility] [employer] Phone: [phone]  Clinic MD: Donzetta Matters  11/20/18 2:41 PM

## 2018-11-20 NOTE — Patient Instructions (Addendum)
Peripheral Vascular Disease  Peripheral vascular disease (PVD) is a disease of the blood vessels that are not part of your heart and brain. A simple term for PVD is poor circulation. In most cases, PVD narrows the blood vessels that carry blood from your heart to the rest of your body. This can reduce the supply of blood to your arms, legs, and internal organs, like your stomach or kidneys. However, PVD most often affects a person's lower legs and feet. Without treatment, PVD tends to get worse. PVD can also lead to acute ischemic limb. This is when an arm or leg suddenly cannot get enough blood. This is a medical emergency. Follow these instructions at home: Lifestyle  Do not use any products that contain nicotine or tobacco, such as cigarettes and e-cigarettes. If you need help quitting, ask your doctor.  Lose weight if you are overweight. Or, stay at a healthy weight as told by your doctor.  Eat a diet that is low in fat and cholesterol. If you need help, ask your doctor.  Exercise regularly. Ask your doctor for activities that are right for you. General instructions  Take over-the-counter and prescription medicines only as told by your doctor.  Take good care of your feet: ? Wear comfortable shoes that fit well. ? Check your feet often for any cuts or sores.  Keep all follow-up visits as told by your doctor This is important. Contact a doctor if:  You have cramps in your legs when you walk.  You have leg pain when you are at rest.  You have coldness in a leg or foot.  Your skin changes.  You are unable to get or have an erection (erectile dysfunction).  You have cuts or sores on your feet that do not heal. Get help right away if:  Your arm or leg turns cold, numb, and blue.  Your arms or legs become red, warm, swollen, painful, or numb.  You have chest pain.  You have trouble breathing.  You suddenly have weakness in your face, arm, or leg.  You become very  confused or you cannot speak.  You suddenly have a very bad headache.  You suddenly cannot see. Summary  Peripheral vascular disease (PVD) is a disease of the blood vessels.  A simple term for PVD is poor circulation. Without treatment, PVD tends to get worse.  Treatment may include exercise, low fat and low cholesterol diet, and quitting smoking. This information is not intended to replace advice given to you by your health care provider. Make sure you discuss any questions you have with your health care provider. Document Released: 07/17/2009 Document Revised: 04/04/2017 Document Reviewed: 05/30/2016 Elsevier Patient Education  2020 [employer].  Steps to Quit Smoking Smoking tobacco is the leading cause of preventable death. It can affect almost every organ in the body. Smoking puts you and people around you at risk for many serious, long-lasting (chronic) diseases. Quitting smoking can be hard, but it is one of the best things that you can do for your health. It is never too late to quit. How do I get ready to quit? When you decide to quit smoking, make a plan to help you succeed. Before you quit:  Pick a date to quit. Set a date within the next 2 weeks to give you time to prepare.  Write down the reasons why you are quitting. Keep this list in places where you will see it often.  Tell your family, friends, and co-workers  that you are quitting. Their support is important.  Talk with your doctor about the choices that may help you quit.  Find out if your health insurance will pay for these treatments.  Know the people, places, things, and activities that make you want to smoke (triggers). Avoid them. What first steps can I take to quit smoking?  Throw away all cigarettes at home, at work, and in your car.  Throw away the things that you use when you smoke, such as ashtrays and lighters.  Clean your car. Make sure to empty the ashtray.  Clean your home, including curtains  and carpets. What can I do to help me quit smoking? Talk with your doctor about taking medicines and seeing a counselor at the same time. You are more likely to succeed when you do both.  If you are pregnant or breastfeeding, talk with your doctor about counseling or other ways to quit smoking. Do not take medicine to help you quit smoking unless your doctor tells you to do so. To quit smoking: Quit right away  Quit smoking totally, instead of slowly cutting back on how much you smoke over a period of time.  Go to counseling. You are more likely to quit if you go to counseling sessions regularly. Take medicine You may take medicines to help you quit. Some medicines need a prescription, and some you can buy over-the-counter. Some medicines may contain a drug called nicotine to replace the nicotine in cigarettes. Medicines may:  Help you to stop having the desire to smoke (cravings).  Help to stop the problems that come when you stop smoking (withdrawal symptoms). Your doctor may ask you to use:  Nicotine patches, gum, or lozenges.  Nicotine inhalers or sprays.  Non-nicotine medicine that is taken by mouth. Find resources Find resources and other ways to help you quit smoking and remain smoke-free after you quit. These resources are most helpful when you use them often. They include:  Online chats with a Social worker.  Phone quitlines.  Printed Furniture conservator/restorer.  Support groups or group counseling.  Text messaging programs.  Mobile phone apps. Use apps on your mobile phone or tablet that can help you stick to your quit plan. There are many free apps for mobile phones and tablets as well as websites. Examples include Quit Guide from the [employer] and smokefree.gov  What things can I do to make it easier to quit?   Talk to your family and friends. Ask them to support and encourage you.  Call a phone quitline (1-800-QUIT-NOW), reach out to support groups, or work with a Social worker.   Ask people who smoke to not smoke around you.  Avoid places that make you want to smoke, such as: ? Bars. ? Parties. ? Smoke-break areas at work.  Spend time with people who do not smoke.  Lower the stress in your life. Stress can make you want to smoke. Try these things to help your stress: ? Getting regular exercise. ? Doing deep-breathing exercises. ? Doing yoga. ? Meditating. ? Doing a body scan. To do this, close your eyes, focus on one area of your body at a time from head to toe. Notice which parts of your body are tense. Try to relax the muscles in those areas. How will I feel when I quit smoking? Day 1 to 3 weeks Within the first 24 hours, you may start to have some problems that come from quitting tobacco. These problems are very bad 2-3 days after  you quit, but they do not often last for more than 2-3 weeks. You may get these symptoms:  Mood swings.  Feeling restless, nervous, angry, or annoyed.  Trouble concentrating.  Dizziness.  Strong desire for high-sugar foods and nicotine.  Weight gain.  Trouble pooping (constipation).  Feeling like you may vomit (nausea).  Coughing or a sore throat.  Changes in how the medicines that you take for other issues work in your body.  Depression.  Trouble sleeping (insomnia). Week 3 and afterward After the first 2-3 weeks of quitting, you may start to notice more positive results, such as:  Better sense of smell and taste.  Less coughing and sore throat.  Slower heart rate.  Lower blood pressure.  Clearer skin.  Better breathing.  Fewer sick days. Quitting smoking can be hard. Do not give up if you fail the first time. Some people need to try a few times before they succeed. Do your best to stick to your quit plan, and talk with your doctor if you have any questions or concerns. Summary  Smoking tobacco is the leading cause of preventable death. Quitting smoking can be hard, but it is one of the best things  that you can do for your health.  When you decide to quit smoking, make a plan to help you succeed.  Quit smoking right away, not slowly over a period of time.  When you start quitting, seek help from your doctor, family, or friends. This information is not intended to replace advice given to you by your health care provider. Make sure you discuss any questions you have with your health care provider. Document Released: 02/16/2009 Document Revised: 07/10/2018 Document Reviewed: 07/11/2018 Elsevier Patient Education  2020 [employer].

## 2018-11-30 ENCOUNTER — Other Ambulatory Visit: Payer: Self-pay

## 2018-11-30 MED ORDER — NITROGLYCERIN 0.4 MG SL SUBL: SUBLINGUAL_TABLET | SUBLINGUAL | 2 refills | Status: DC

## 2018-12-04 DIAGNOSIS — Z794 Long term (current) use of insulin: Secondary | ICD-10-CM | POA: Diagnosis not present

## 2018-12-04 DIAGNOSIS — H5213 Myopia, bilateral: Secondary | ICD-10-CM | POA: Diagnosis not present

## 2018-12-04 DIAGNOSIS — H52203 Unspecified astigmatism, bilateral: Secondary | ICD-10-CM | POA: Diagnosis not present

## 2018-12-04 DIAGNOSIS — E119 Type 2 diabetes mellitus without complications: Secondary | ICD-10-CM | POA: Diagnosis not present

## 2018-12-04 DIAGNOSIS — H524 Presbyopia: Secondary | ICD-10-CM | POA: Diagnosis not present

## 2018-12-09 DIAGNOSIS — L03313 Cellulitis of chest wall: Secondary | ICD-10-CM | POA: Diagnosis not present

## 2018-12-18 ENCOUNTER — Other Ambulatory Visit: Payer: Self-pay

## 2018-12-18 NOTE — Telephone Encounter (Signed)
refill 

## 2018-12-25 DIAGNOSIS — Z794 Long term (current) use of insulin: Secondary | ICD-10-CM | POA: Diagnosis not present

## 2018-12-25 DIAGNOSIS — E1165 Type 2 diabetes mellitus with hyperglycemia: Secondary | ICD-10-CM | POA: Diagnosis not present

## 2019-01-26 DIAGNOSIS — M25551 Pain in right hip: Secondary | ICD-10-CM | POA: Diagnosis not present

## 2019-01-26 DIAGNOSIS — M7061 Trochanteric bursitis, right hip: Secondary | ICD-10-CM | POA: Diagnosis not present

## 2019-01-26 DIAGNOSIS — M7062 Trochanteric bursitis, left hip: Secondary | ICD-10-CM | POA: Diagnosis not present

## 2019-01-26 DIAGNOSIS — M25552 Pain in left hip: Secondary | ICD-10-CM | POA: Diagnosis not present

## 2019-02-02 DIAGNOSIS — M25551 Pain in right hip: Secondary | ICD-10-CM | POA: Diagnosis not present

## 2019-02-02 DIAGNOSIS — I10 Essential (primary) hypertension: Secondary | ICD-10-CM | POA: Diagnosis not present

## 2019-02-02 DIAGNOSIS — G4700 Insomnia, unspecified: Secondary | ICD-10-CM | POA: Diagnosis not present

## 2019-02-25 DIAGNOSIS — E1140 Type 2 diabetes mellitus with diabetic neuropathy, unspecified: Secondary | ICD-10-CM | POA: Diagnosis not present

## 2019-02-25 DIAGNOSIS — E782 Mixed hyperlipidemia: Secondary | ICD-10-CM | POA: Diagnosis not present

## 2019-02-25 DIAGNOSIS — E1121 Type 2 diabetes mellitus with diabetic nephropathy: Secondary | ICD-10-CM | POA: Diagnosis not present

## 2019-02-25 DIAGNOSIS — E1165 Type 2 diabetes mellitus with hyperglycemia: Secondary | ICD-10-CM | POA: Diagnosis not present

## 2019-02-25 DIAGNOSIS — I10 Essential (primary) hypertension: Secondary | ICD-10-CM | POA: Diagnosis not present

## 2019-03-02 DIAGNOSIS — E1140 Type 2 diabetes mellitus with diabetic neuropathy, unspecified: Secondary | ICD-10-CM | POA: Diagnosis not present

## 2019-03-02 DIAGNOSIS — G4700 Insomnia, unspecified: Secondary | ICD-10-CM | POA: Diagnosis not present

## 2019-03-02 DIAGNOSIS — I739 Peripheral vascular disease, unspecified: Secondary | ICD-10-CM | POA: Diagnosis not present

## 2019-03-02 DIAGNOSIS — E1165 Type 2 diabetes mellitus with hyperglycemia: Secondary | ICD-10-CM | POA: Diagnosis not present

## 2019-03-02 DIAGNOSIS — I10 Essential (primary) hypertension: Secondary | ICD-10-CM | POA: Diagnosis not present

## 2019-03-10 DIAGNOSIS — M25551 Pain in right hip: Secondary | ICD-10-CM | POA: Diagnosis not present

## 2019-03-11 ENCOUNTER — Telehealth: Payer: Self-pay | Admitting: *Deleted

## 2019-03-11 NOTE — Telephone Encounter (Signed)
Called and c/o 2 week history of LLE swelling and would like to see NP to evaluate. She is pending hip surgery and desires to "have things checked out to ease my mind" She DENIED any decrease sensation, temperature of color. Temp equal in both feet. I assured her that is good news. APPT time given.

## 2019-03-15 ENCOUNTER — Ambulatory Visit (HOSPITAL_COMMUNITY)
Admission: RE | Admit: 2019-03-15 | Discharge: 2019-03-15 | Disposition: A | Discharge status: Home / Self Care | Payer: Medicare Other | Source: Ambulatory Visit | Attending: Surgery | Admitting: Surgery

## 2019-03-15 ENCOUNTER — Ambulatory Visit (INDEPENDENT_AMBULATORY_CARE_PROVIDER_SITE_OTHER): Payer: Medicare Other | Admitting: Surgery

## 2019-03-15 ENCOUNTER — Other Ambulatory Visit: Payer: Self-pay

## 2019-03-15 ENCOUNTER — Other Ambulatory Visit (HOSPITAL_COMMUNITY): Payer: Self-pay | Admitting: Family

## 2019-03-15 ENCOUNTER — Encounter: Payer: Self-pay | Admitting: Family

## 2019-03-15 VITALS — BP 148/94 | HR 102 | Temp 97.8°F | Resp 14 | Ht 64.0 in | Wt 142.0 lb

## 2019-03-15 DIAGNOSIS — I779 Disorder of arteries and arterioles, unspecified: Secondary | ICD-10-CM

## 2019-03-15 DIAGNOSIS — R600 Localized edema: Secondary | ICD-10-CM

## 2019-03-15 DIAGNOSIS — I70212 Atherosclerosis of native arteries of extremities with intermittent claudication, left leg: Secondary | ICD-10-CM | POA: Diagnosis not present

## 2019-03-15 DIAGNOSIS — Z95828 Presence of other vascular implants and grafts: Secondary | ICD-10-CM

## 2019-03-15 DIAGNOSIS — F17200 Nicotine dependence, unspecified, uncomplicated: Secondary | ICD-10-CM | POA: Diagnosis not present

## 2019-03-15 DIAGNOSIS — M25551 Pain in right hip: Secondary | ICD-10-CM | POA: Diagnosis not present

## 2019-03-15 NOTE — Patient Instructions (Addendum)
Pe Steps to Quit Smoking Smoking tobacco is the leading cause of preventable death. It can affect almost every organ in the body. Smoking puts you and people around you at risk for many serious, long-lasting (chronic) diseases. Quitting smoking can be hard, but it is one of the best things that you can do for your health. It is never too late to quit. How do I get ready to quit? When you decide to quit smoking, make a plan to help you succeed. Before you quit:  Pick a date to quit. Set a date within the next 2 weeks to give you time to prepare.  Write down the reasons why you are quitting. Keep this list in places where you will see it often.  Tell your family, friends, and co-workers that you are quitting. Their support is important.  Talk with your doctor about the choices that may help you quit.  Find out if your health insurance will pay for these treatments.  Know the people, places, things, and activities that make you want to smoke (triggers). Avoid them. What first steps can I take to quit smoking?  Throw away all cigarettes at home, at work, and in your car.  Throw away the things that you use when you smoke, such as ashtrays and lighters.  Clean your car. Make sure to empty the ashtray.  Clean your home, including curtains and carpets. What can I do to help me quit smoking? Talk with your doctor about taking medicines and seeing a counselor at the same time. You are more likely to succeed when you do both.  If you are pregnant or breastfeeding, talk with your doctor about counseling or other ways to quit smoking. Do not take medicine to help you quit smoking unless your doctor tells you to do so. To quit smoking: Quit right away  Quit smoking totally, instead of slowly cutting back on how much you smoke over a period of time.  Go to counseling. You are more likely to quit if you go to counseling sessions regularly. Take medicine You may take medicines to help you  quit. Some medicines need a prescription, and some you can buy over-the-counter. Some medicines may contain a drug called nicotine to replace the nicotine in cigarettes. Medicines may:  Help you to stop having the desire to smoke (cravings).  Help to stop the problems that come when you stop smoking (withdrawal symptoms). Your doctor may ask you to use:  Nicotine patches, gum, or lozenges.  Nicotine inhalers or sprays.  Non-nicotine medicine that is taken by mouth. Find resources Find resources and other ways to help you quit smoking and remain smoke-free after you quit. These resources are most helpful when you use them often. They include:  Online chats with a Social worker.  Phone quitlines.  Printed Furniture conservator/restorer.  Support groups or group counseling.  Text messaging programs.  Mobile phone apps. Use apps on your mobile phone or tablet that can help you stick to your quit plan. There are many free apps for mobile phones and tablets as well as websites. Examples include Quit Guide from the [employer] and smokefree.gov  What things can I do to make it easier to quit?   Talk to your family and friends. Ask them to support and encourage you.  Call a phone quitline (1-800-QUIT-NOW), reach out to support groups, or work with a Social worker.  Ask people who smoke to not smoke around you.  Avoid places that make you  want to smoke, such as: ? Bars. ? Parties. ? Smoke-break areas at work.  Spend time with people who do not smoke.  Lower the stress in your life. Stress can make you want to smoke. Try these things to help your stress: ? Getting regular exercise. ? Doing deep-breathing exercises. ? Doing yoga. ? Meditating. ? Doing a body scan. To do this, close your eyes, focus on one area of your body at a time from head to toe. Notice which parts of your body are tense. Try to relax the muscles in those areas. How will I feel when I quit smoking? Day 1 to 3 weeks Within the first  24 hours, you may start to have some problems that come from quitting tobacco. These problems are very bad 2-3 days after you quit, but they do not often last for more than 2-3 weeks. You may get these symptoms:  Mood swings.  Feeling restless, nervous, angry, or annoyed.  Trouble concentrating.  Dizziness.  Strong desire for high-sugar foods and nicotine.  Weight gain.  Trouble pooping (constipation).  Feeling like you may vomit (nausea).  Coughing or a sore throat.  Changes in how the medicines that you take for other issues work in your body.  Depression.  Trouble sleeping (insomnia). Week 3 and afterward After the first 2-3 weeks of quitting, you may start to notice more positive results, such as:  Better sense of smell and taste.  Less coughing and sore throat.  Slower heart rate.  Lower blood pressure.  Clearer skin.  Better breathing.  Fewer sick days. Quitting smoking can be hard. Do not give up if you fail the first time. Some people need to try a few times before they succeed. Do your best to stick to your quit plan, and talk with your doctor if you have any questions or concerns. Summary  Smoking tobacco is the leading cause of preventable death. Quitting smoking can be hard, but it is one of the best things that you can do for your health.  When you decide to quit smoking, make a plan to help you succeed.  Quit smoking right away, not slowly over a period of time.  When you start quitting, seek help from your doctor, family, or friends. This information is not intended to replace advice given to you by your health care provider. Make sure you discuss any questions you have with your health care provider. Document Released: 02/16/2009 Document Revised: 07/10/2018 Document Reviewed: 07/11/2018 Elsevier Patient Education  2020 [employer]. ripheral Vascular Disease  Peripheral vascular disease (PVD) is a disease of the blood vessels that are not part  of your heart and brain. A simple term for PVD is poor circulation. In most cases, PVD narrows the blood vessels that carry blood from your heart to the rest of your body. This can reduce the supply of blood to your arms, legs, and internal organs, like your stomach or kidneys. However, PVD most often affects a person's lower legs and feet. Without treatment, PVD tends to get worse. PVD can also lead to acute ischemic limb. This is when an arm or leg suddenly cannot get enough blood. This is a medical emergency. Follow these instructions at home: Lifestyle  Do not use any products that contain nicotine or tobacco, such as cigarettes and e-cigarettes. If you need help quitting, ask your doctor.  Lose weight if you are overweight. Or, stay at a healthy weight as told by your doctor.  Eat a  diet that is low in fat and cholesterol. If you need help, ask your doctor.  Exercise regularly. Ask your doctor for activities that are right for you. General instructions  Take over-the-counter and prescription medicines only as told by your doctor.  Take good care of your feet: ? Wear comfortable shoes that fit well. ? Check your feet often for any cuts or sores.  Keep all follow-up visits as told by your doctor This is important. Contact a doctor if:  You have cramps in your legs when you walk.  You have leg pain when you are at rest.  You have coldness in a leg or foot.  Your skin changes.  You are unable to get or have an erection (erectile dysfunction).  You have cuts or sores on your feet that do not heal. Get help right away if:  Your arm or leg turns cold, numb, and blue.  Your arms or legs become red, warm, swollen, painful, or numb.  You have chest pain.  You have trouble breathing.  You suddenly have weakness in your face, arm, or leg.  You become very confused or you cannot speak.  You suddenly have a very bad headache.  You suddenly cannot see. Summary  Peripheral  vascular disease (PVD) is a disease of the blood vessels.  A simple term for PVD is poor circulation. Without treatment, PVD tends to get worse.  Treatment may include exercise, low fat and low cholesterol diet, and quitting smoking. This information is not intended to replace advice given to you by your health care provider. Make sure you discuss any questions you have with your health care provider. Document Released: 07/17/2009 Document Revised: 04/04/2017 Document Reviewed: 05/30/2016 Elsevier Patient Education  2020 [employer]  

## 2019-03-15 NOTE — Progress Notes (Signed)
[facility] [place]  Patient name: [name] MRN: [id number] DOB: 1955-07-06 Sex: female   REASON FOR VISIT:    Follow up  HISOTRY OF PRESENT ILLNESS:   [name] is a 63 y.o. female who returns today for follow-up.  She is status post left femoral to popliteal artery bypass graft with PTFE on 06/09/2016 for lifestyle limiting claudication.  She had previously undergone atherectomy and angioplasty by Dr. Gwenlyn Found.  The patient reports a 2 week history of left leg swelling and throbbing.  She is worried about another stroke.  He eye is twitching and she has a droopy left eye lid and left sided headaches   PAST MEDICAL HISTORY:   Past Medical History:  Diagnosis Date  . 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 Anemia    hx  . Angioedema 07/10/2015  . Anxiety   . Arthritis    "neck, left hand" (09/14/2012)  . Asthma   . Cancer ([place]) 1985   ovarian, no treatment except surgery  . Complication of anesthesia    OCCASIONAL TROUBLE TURNING NECK TO RIGHT  . Critical lower limb ischemia    10/2014 s/p L SFA stenting  . Diverticulosis of colon with hemorrhage April 2013  . GERD (gastroesophageal reflux disease)   . H/O hiatal hernia   . Heart attack [facility])    2003 mild MI, March 2013 mild MI  . Hidradenitis    groin  . History of blood transfusion 1985 AND 2013  . Hyperlipidemia   . Hypertension   . Irritable bowel syndrome   . Left-sided weakness    since stroke, left eye trouble seeing  . Migraines   . Mild CAD    a. Cath 09/2010: mild luminal irregularities of LAD, 30% prox RCA and 20-30% mRCA, EF 65%.  . Neuropathy   . Obesity   . PAD (peripheral artery disease) ([place])    a. critical limb ischemia s/p PTA/stenting of L SFA 10/2014. c. occ prior SFA stent by angio 01/2016, for possible PV bypass.  . Pneumonia    baby  . Recurrent upper respiratory infection (URI)   . S/P arterial stent-mid Lt SFA 11/03/14 11/04/2014  .  Schatzki's ring   . Sinus problem   . Stomach ulcer 1972   non-bleeding  . Stroke ([place]) 07-2007, 07-2008, 07-2009   total 3 strokes; mild left sided weakness and left eye "jumps".  . Type II diabetes mellitus ([place])   . Vitamin B 12 deficiency 07-15-2013     FAMILY HISTORY:   Family History  Problem Relation Age of Onset  . Breast cancer Mother   . Heart disease Mother   . Hypertension Mother   . Diabetes Father   . Heart disease Father   . Stroke Father   . Heart disease Sister   . Hypertension Sister   . Hypertension Sister   . Hyperlipidemia Sister   . Diabetes Sister   . Allergic rhinitis Sister   . Emphysema Other        great uncle  . Aneurysm Sister        brain  . Angioedema Neg Hx   . Asthma Neg Hx   . Eczema Neg Hx   . Immunodeficiency Neg Hx   . Urticaria Neg Hx     SOCIAL HISTORY:   Social History   Tobacco Use  . Smoking status: Former Smoker    Packs/day: 0.33    Years: 41.00    Pack years:  13.53    Types: Cigarettes    Quit date: 01/15/2016    Years since quitting: 3.1  . Smokeless tobacco: Never Used  . Tobacco comment: Getting ready to start nicotine patches RX by Dr. Gwenlyn Found per pt.  Substance Use Topics  . Alcohol use: No    Alcohol/week: 0.0 standard drinks     ALLERGIES:   Allergies  Allergen Reactions  . Sulfa Antibiotics Shortness Of Breath and Palpitations  . 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 Fish Allergy Hives and Swelling    Tongue swelling  . Iodine Swelling  . Metformin And Related Diarrhea  . [name] [Insulin Glargine-Lixisenatide] Itching and Other (See Comments)    "tongue swelling"  . Adhesive [Tape] Rash    Paper tape is ok  . Shellfish Allergy Swelling and Rash    Tongue swelling     CURRENT MEDICATIONS:   Current Outpatient Medications  Medication Sig Dispense Refill  . albuterol (PROAIR HFA) 108 (90 Base) MCG/ACT inhaler 1-2 inhalations every 4-6 hours as  needed for cough or wheeze. (Patient taking differently: Inhale 1-2 puffs into the lungs every 4 (four) hours as needed for shortness of breath. ) 1 Inhaler 1  . ALPRAZolam (XANAX) 0.5 MG tablet Take 0.25 mg by mouth daily as needed for anxiety or sleep.     Marland Kitchen amLODipine (NORVASC) 5 MG tablet Take 1 tablet (5 mg total) by mouth daily. 30 tablet 11  . aspirin EC 81 MG tablet Take 81 mg by mouth daily.    . Azelastine HCl 0.15 % SOLN Place 2 sprays into both nostrils 2 (two) times daily. (Patient taking differently: Place 2 sprays into both nostrils 2 (two) times daily as needed (allergies). ) 30 mL 5  . citalopram (CELEXA) 10 MG tablet Take 10 mg by mouth daily.    . clopidogrel (PLAVIX) 75 MG tablet TAKE 1 TABLET BY MOUTH EVERY DAY WITH SUPPER - NEEDS OFFICE VISIT 30 tablet 11  . cyclobenzaprine (FLEXERIL) 10 MG tablet Take 10 mg by mouth 3 (three) times daily as needed for muscle spasms.    Marland Kitchen EPINEPHrine 0.3 mg/0.3 mL IJ SOAJ injection Inject 0.3 mLs (0.3 mg total) into the muscle once. 1 Device 1  . Fenofibric Acid 105 MG TABS Take 1 tablet by mouth daily.    . insulin lispro (HUMALOG) 100 UNIT/ML injection Inject 5-12 Units into the skin 2 (two) times daily before a meal. Per sliding scale    . isosorbide mononitrate (IMDUR) 30 MG 24 hr tablet Take 1 tablet (30 mg total) by mouth daily. 30 tablet 5  . loratadine (CLARITIN) 10 MG tablet Take 10 mg by mouth daily as needed for allergies.    Marland Kitchen meclizine (ANTIVERT) 25 MG tablet Take 1 tablet by mouth 3 (three) times daily as needed for dizziness.  3  . mupirocin cream (BACTROBAN) 2 % Apply 1 application topically 2 (two) times daily as needed (wound care (boils)).     . nitroGLYCERIN (NITROSTAT) 0.4 MG SL tablet PLACE 1 TAB UNDER TONGUE EVERY 5 MINS AS NEEDED FOR CHEST PAIN - MAX 3 DOSES THEN 911 25 tablet 2  . pantoprazole (PROTONIX) 40 MG tablet TAKE 1 TABLET BY MOUTH EVERY DAY 30 tablet 11  . propranolol ER (INDERAL LA) 60 MG 24 hr capsule Take 1  capsule (60 mg total) by mouth every evening. 30 capsule 5  . rosuvastatin (CRESTOR) 40 MG tablet Take 40  mg by mouth daily.    . SUMAtriptan (IMITREX) 50 MG tablet Take 50 mg by mouth every 2 (two) hours as needed for migraine.    . temazepam (RESTORIL) 30 MG capsule Take 30 mg by mouth at bedtime.     No current facility-administered medications for this visit.     REVIEW OF SYSTEMS:   [X]  denotes positive finding, [ ]  denotes negative finding Cardiac  Comments:  Chest pain or chest pressure:    Shortness of breath upon exertion:    Short of breath when lying flat:    Irregular heart rhythm:        Vascular    Pain in calf, thigh, or hip brought on by ambulation:    Pain in feet at night that wakes you up from your sleep:     Blood clot in your veins:    Leg swelling:  x       Pulmonary    Oxygen at home:    Productive cough:     Wheezing:         Neurologic    Sudden weakness in arms or legs:     Sudden numbness in arms or legs:     Sudden onset of difficulty speaking or slurred speech:    Temporary loss of vision in one eye:     Problems with dizziness:         Gastrointestinal    Blood in stool:     Vomited blood:         Genitourinary    Burning when urinating:     Blood in urine:        Psychiatric    Major depression:         Hematologic    Bleeding problems:    Problems with blood clotting too easily:        Skin    Rashes or ulcers:        Constitutional    Fever or chills:      PHYSICAL EXAM:   Vitals:   03/15/19 1323  BP: (!) 148/94  Pulse: (!) 102  Resp: 14  Temp: 97.8 F (36.6 C)  TempSrc: Temporal  SpO2: 98%  Weight: 142 lb (64.4 kg)  Height: 5\' 4"  (1.626 m)    GENERAL: The patient is a well-nourished female, in no acute distress. The vital signs are documented above. CARDIAC: There is a regular rate and rhythm.  VASCULAR: 1+ left leg edema PULMONARY: Non-labored respirations  MUSCULOSKELETAL: There are no major deformities or  cyanosis. NEUROLOGIC: No focal weakness or paresthesias are detected. SKIN: There are no ulcers or rashes noted. PSYCHIATRIC: The patient has a normal affect.  STUDIES:   I have reviewed her studies as follows: Venous u/s: Right: No evidence of common femoral vein obstruction. Left: There is no evidence of deep vein thrombosis in the lower extremity.   11-23-18:  BPG widely patent  MEDICAL ISSUES:   Leg swelling: Unclear etiology.  Ultrasound was negative for DVT.  I recommended compression stockings and leg elevation  Patient is complaining of her eye jumping, left-sided headaches.  She is worried that this may be another stroke.  Her blood pressure is slightly elevated today.  She has no focal deficits other than a left eyelid droop which appears to be somewhat chronic.  She is trying to reach Dr. Juel Burrow office to have her Xanax refilled as she thinks that she is having issues with anxiety.  I reviewed her carotid  studies from several years ago which were unremarkable.  If she is having any kind of neurologic deficit it is most likely related to intracranial disease.  She will follow up with Dr. Nevada Crane in the near future.  Status post left femoral-popliteal bypass graft: Her bypass was widely patent by ultrasound over the summer and appears to be without issue currently.  She is scheduled for follow-up in July 2021     Leia Alf, MD, FACS [facility] [phone] Pager [phone]

## 2019-03-22 DIAGNOSIS — R0602 Shortness of breath: Secondary | ICD-10-CM | POA: Diagnosis not present

## 2019-03-22 DIAGNOSIS — J069 Acute upper respiratory infection, unspecified: Secondary | ICD-10-CM | POA: Diagnosis not present

## 2019-03-26 ENCOUNTER — Other Ambulatory Visit: Payer: Self-pay

## 2019-03-26 ENCOUNTER — Ambulatory Visit (INDEPENDENT_AMBULATORY_CARE_PROVIDER_SITE_OTHER): Payer: Medicare Other

## 2019-03-26 ENCOUNTER — Encounter: Payer: Self-pay | Admitting: Emergency Medicine

## 2019-03-26 ENCOUNTER — Ambulatory Visit
Admission: EM | Admit: 2019-03-26 | Discharge: 2019-03-26 | Disposition: A | Discharge status: Home / Self Care | Payer: Medicare Other | Attending: Emergency Medicine | Admitting: Emergency Medicine

## 2019-03-26 DIAGNOSIS — S20211A Contusion of right front wall of thorax, initial encounter: Secondary | ICD-10-CM

## 2019-03-26 DIAGNOSIS — J9811 Atelectasis: Secondary | ICD-10-CM

## 2019-03-26 DIAGNOSIS — R0782 Intercostal pain: Secondary | ICD-10-CM | POA: Diagnosis not present

## 2019-03-26 DIAGNOSIS — S299XXA Unspecified injury of thorax, initial encounter: Secondary | ICD-10-CM | POA: Diagnosis not present

## 2019-03-26 DIAGNOSIS — R0781 Pleurodynia: Secondary | ICD-10-CM | POA: Diagnosis not present

## 2019-03-26 MED ORDER — NAPROXEN 375 MG PO TABS: 375.0000 mg | ORAL_TABLET | Freq: Two times a day (BID) | ORAL | 0 refills | Status: DC

## 2019-03-26 NOTE — Discharge Instructions (Addendum)
X-rays negative for fracture or dislocation, but did show partial lung collapse Continue conservative management of rest, ice, heat, and gentle stretches/ massage Take naproxen as needed for pain relief (may cause abdominal discomfort, ulcers, and GI bleeds avoid taking with other NSAIDs or citalopram) Continue with muscle relaxer as prescribed Make an effort to take deep breaths throughout the day to prevent worsening atelectasis Follow up with PCP if symptoms persist Return or go to the ER if you have any new or worsening symptoms (fever, chills, chest pain, shortness of breath, abdominal pain, nausea, vomiting, difficulty breathing, etc...)

## 2019-03-26 NOTE — ED Provider Notes (Signed)
[place]   [id number] 03/26/19 Arrival Time: [id number]  CC: RT rib pain  SUBJECTIVE: History from: patient. [name] is a 63 y.o. female complains of right rib pain that began 2 days ago.  Symptoms began after reaching over cart to place items on the conveyor belt.  Localizes the pain to the right lower ribs.  Describes the pain as constant and achy in character.  Has tried muscle relaxer without relief.  Symptoms are made worse with deep breaths, and movement.  Denies similar symptoms in the past.  Complains of mild SOB with deep breaths.  Denies fever, chills, rhinorrhea, congestion, cough, chest pain, dyspnea, erythema, ecchymosis, effusion, weakness, numbness and tingling.    ROS: As per HPI.  All other pertinent ROS negative.     Past Medical History:  Diagnosis Date  . 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 Anemia    hx  . Angioedema 07/10/2015  . Anxiety   . Arthritis    "neck, left hand" (09/14/2012)  . Asthma   . Cancer ([place]) 1985   ovarian, no treatment except surgery  . Complication of anesthesia    OCCASIONAL TROUBLE TURNING NECK TO RIGHT  . Critical lower limb ischemia    10/2014 s/p L SFA stenting  . Diverticulosis of colon with hemorrhage April 2013  . GERD (gastroesophageal reflux disease)   . H/O hiatal hernia   . Heart attack [facility])    2003 mild MI, March 2013 mild MI  . Hidradenitis    groin  . History of blood transfusion 1985 AND 2013  . Hyperlipidemia   . Hypertension   . Irritable bowel syndrome   . Left-sided weakness    since stroke, left eye trouble seeing  . Migraines   . Mild CAD    a. Cath 09/2010: mild luminal irregularities of LAD, 30% prox RCA and 20-30% mRCA, EF 65%.  . Neuropathy   . Obesity   . PAD (peripheral artery disease) ([place])    a. critical limb ischemia s/p PTA/stenting of L SFA 10/2014. c. occ prior SFA stent by angio 01/2016, for possible PV bypass.  . Pneumonia    baby  . Recurrent upper respiratory infection (URI)   .  S/P arterial stent-mid Lt SFA 11/03/14 11/04/2014  . Schatzki's ring   . Sinus problem   . Stomach ulcer 1972   non-bleeding  . Stroke ([place]) 07-2007, 07-2008, 07-2009   total 3 strokes; mild left sided weakness and left eye "jumps".  . Type II diabetes mellitus ([place])   . Vitamin B 12 deficiency 07-15-2013   Past Surgical History:  Procedure Laterality Date  . ABDOMINAL HYSTERECTOMY  1985  . ADENOIDECTOMY    . ANTERIOR CERVICAL DECOMP/DISCECTOMY FUSION  2002  . ANTERIOR CERVICAL DECOMP/DISCECTOMY FUSION N/A 04/08/2014   Procedure: Cervical Six-Seven ANTERIOR CERVICAL DECOMPRESSION/DISCECTOMY FUSION Plating and Bonegraft  2 LEVELS;  Surgeon: Ashok Pall, MD;  Location: [place] NEURO ORS;  Service: Neurosurgery;  Laterality: N/A;  Cervical Six-Seven ANTERIOR CERVICAL DECOMPRESSION/DISCECTOMY FUSION Plating and Bonegraft  2 LEVELS  . APPENDECTOMY  1985  . AXILLARY HIDRADENITIS EXCISION  1990-2008   bilateral  . BACK SURGERY    . BREAST BIOPSY Right 2007  . BREAST CYST EXCISION Right 2008  . BREAST REDUCTION SURGERY    . CARDIAC CATHETERIZATION  2004   mild disease  . CATARACT EXTRACTION W/PHACO Left 05/16/2015   Procedure: CATARACT EXTRACTION PHACO AND INTRAOCULAR LENS PLACEMENT (IOC);  Surgeon: Rutherford Guys, MD;  Location: [facility];  Service: Ophthalmology;  Laterality: Left;  CDE: 4.24  . CATARACT EXTRACTION W/PHACO Right 05/30/2015   Procedure: CATARACT EXTRACTION RIGHT EYE PHACO AND INTRAOCULAR LENS PLACEMENT ;  Surgeon: Rutherford Guys, MD;  Location: [facility];  Service: Ophthalmology;  Laterality: Right;  CDE:4.08  . CHOLECYSTECTOMY  1990's  . COLONOSCOPY  08/12/2011   Procedure: COLONOSCOPY;  Surgeon: Ladene Artist, MD,FACG;  Location: [facility] ENDOSCOPY;  Service: Endoscopy;  Laterality: N/A;  . COLONOSCOPY  06/19/2006  . cyst thigh Right   . ESOPHAGOGASTRODUODENOSCOPY  08/12/2011   Procedure: ESOPHAGOGASTRODUODENOSCOPY (EGD);  Surgeon: Ladene Artist, MD,FACG;  Location: [facility] ENDOSCOPY;  Service:  Endoscopy;  Laterality: N/A;  . ESOPHAGOGASTRODUODENOSCOPY  06/04/2005  . FEMORAL-POPLITEAL BYPASS GRAFT Left 06/19/2016   Procedure: Left Leg BYPASS GRAFT FEMORAL-POPLITEAL ARTERY;  Surgeon: Serafina Mitchell, MD;  Location: [place] [facility];  Service: Vascular;  Laterality: Left;  . GIVENS CAPSULE STUDY  08/13/2011   Procedure: GIVENS CAPSULE STUDY;  Surgeon: Ladene Artist, MD,FACG;  Location: [facility] ENDOSCOPY;  Service: Endoscopy;  Laterality: N/A;  . HAMMER TOE SURGERY Bilateral ~ 2000  . HIATAL HERNIA REPAIR    . HYDRADENITIS EXCISION  01/2011; 03/2012   'groin and abdomen; 03/2012" (09/14/2012)  . HYDRADENITIS EXCISION  04/01/2012   Procedure: EXCISION HYDRADENITIS GROIN;  Surgeon: Pedro Earls, MD;  Location: [facility] ORS;  Service: General;  Laterality: Bilateral;  Excision of Hydradenitis of Perineum  . HYDRADENITIS EXCISION N/A 09/17/2013   Procedure: EXCISION PERINEAL HIDRADENITIS ;  Surgeon: Pedro Earls, MD;  Location: [facility];  Service: General;  Laterality: N/A;  also in the pubis area  . LEFT HEART CATH AND CORONARY ANGIOGRAPHY N/A 12/26/2016   Procedure: LEFT HEART CATH AND CORONARY ANGIOGRAPHY;  Surgeon: [name], Peter M, MD;  Location: [facility] CV LAB;  Service: Cardiovascular;  Laterality: N/A;  . MASS EXCISION Right 09/17/2013   Procedure: EXCISION MASS;  Surgeon: Pedro Earls, MD;  Location: [facility];  Service: General;  Laterality: Right;  . NISSEN FUNDOPLICATION  [id number]  . PERIPHERAL VASCULAR CATHETERIZATION N/A 11/03/2014   Procedure: Lower Extremity Angiography;  Surgeon: Lorretta Harp, MD;  Location: [place] CV LAB;  Service: Cardiovascular;  Laterality: N/A;  . PERIPHERAL VASCULAR CATHETERIZATION N/A 01/29/2016   Procedure: Lower Extremity Angiography;  Surgeon: Lorretta Harp, MD;  Location: [place] CV LAB;  Service: Cardiovascular;  Laterality: N/A;  . POSTERIOR LUMBAR FUSION  2008 X 2  . REDUCTION MAMMAPLASTY  1996?  . TONSILLECTOMY AND ADENOIDECTOMY  1959 AND 2000  .  UVULOPALATOPHARYNGOPLASTY, TONSILLECTOMY AND SEPTOPLASTY  2000's   Allergies  Allergen Reactions  . Sulfa Antibiotics Shortness Of Breath and Palpitations  . 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 Fish Allergy Hives and Swelling    Tongue swelling  . Iodine Swelling  . Metformin And Related Diarrhea  . [name] [Insulin Glargine-Lixisenatide] Itching and Other (See Comments)    "tongue swelling"  . Adhesive [Tape] Rash    Paper tape is ok  . Shellfish Allergy Swelling and Rash    Tongue swelling   No current facility-administered medications on file prior to encounter.    Current Outpatient Medications on File Prior to Encounter  Medication Sig Dispense Refill  . albuterol (PROAIR HFA) 108 (90 Base) MCG/ACT inhaler 1-2 inhalations every 4-6 hours as needed for cough or wheeze. (Patient taking differently: Inhale 1-2 puffs into the lungs every 4 (four) hours as needed for shortness  of breath. ) 1 Inhaler 1  . ALPRAZolam (XANAX) 0.5 MG tablet Take 0.25 mg by mouth daily as needed for anxiety or sleep.     Marland Kitchen amLODipine (NORVASC) 5 MG tablet Take 1 tablet (5 mg total) by mouth daily. 30 tablet 11  . aspirin EC 81 MG tablet Take 81 mg by mouth daily.    . Azelastine HCl 0.15 % SOLN Place 2 sprays into both nostrils 2 (two) times daily. (Patient taking differently: Place 2 sprays into both nostrils 2 (two) times daily as needed (allergies). ) 30 mL 5  . citalopram (CELEXA) 10 MG tablet Take 10 mg by mouth daily.    . clopidogrel (PLAVIX) 75 MG tablet TAKE 1 TABLET BY MOUTH EVERY DAY WITH SUPPER - NEEDS OFFICE VISIT 30 tablet 11  . cyclobenzaprine (FLEXERIL) 10 MG tablet Take 10 mg by mouth 3 (three) times daily as needed for muscle spasms.    Marland Kitchen EPINEPHrine 0.3 mg/0.3 mL IJ SOAJ injection Inject 0.3 mLs (0.3 mg total) into the muscle once. 1 Device 1  . Fenofibric Acid 105 MG TABS Take 1 tablet by mouth daily.    . insulin lispro (HUMALOG) 100  UNIT/ML injection Inject 5-12 Units into the skin 2 (two) times daily before a meal. Per sliding scale    . isosorbide mononitrate (IMDUR) 30 MG 24 hr tablet Take 1 tablet (30 mg total) by mouth daily. 30 tablet 5  . loratadine (CLARITIN) 10 MG tablet Take 10 mg by mouth daily as needed for allergies.    Marland Kitchen meclizine (ANTIVERT) 25 MG tablet Take 1 tablet by mouth 3 (three) times daily as needed for dizziness.  3  . mupirocin cream (BACTROBAN) 2 % Apply 1 application topically 2 (two) times daily as needed (wound care (boils)).     . nitroGLYCERIN (NITROSTAT) 0.4 MG SL tablet PLACE 1 TAB UNDER TONGUE EVERY 5 MINS AS NEEDED FOR CHEST PAIN - MAX 3 DOSES THEN 911 25 tablet 2  . pantoprazole (PROTONIX) 40 MG tablet TAKE 1 TABLET BY MOUTH EVERY DAY 30 tablet 11  . propranolol ER (INDERAL LA) 60 MG 24 hr capsule Take 1 capsule (60 mg total) by mouth every evening. 30 capsule 5  . rosuvastatin (CRESTOR) 40 MG tablet Take 40 mg by mouth daily.    . SUMAtriptan (IMITREX) 50 MG tablet Take 50 mg by mouth every 2 (two) hours as needed for migraine.    . temazepam (RESTORIL) 30 MG capsule Take 30 mg by mouth at bedtime.     Social History   Socioeconomic History  . Marital status: Divorced    Spouse name: Not on file  . Number of children: 1  . Years of education: Not on file  . Highest education level: Not on file  Occupational History    Employer: UNEMPLOYED  Social Needs  . Financial resource strain: Not on file  . Food insecurity    Worry: Not on file    Inability: Not on file  . Transportation needs    Medical: Not on file    Non-medical: Not on file  Tobacco Use  . Smoking status: Former Smoker    Packs/day: 0.33    Years: 41.00    Pack years: 13.53    Types: Cigarettes    Quit date: 01/15/2016    Years since quitting: 3.1  . Smokeless tobacco: Never Used  . Tobacco comment: Getting ready to start nicotine patches RX by Dr. Gwenlyn Found per pt.  Substance and  Sexual Activity  . Alcohol use:  No    Alcohol/week: 0.0 standard drinks  . Drug use: No    Types: "Crack" cocaine    Comment: 05/09/2015.  "quit 07/27/1994"  . Sexual activity: Not Currently    Birth control/protection: Abstinence  Lifestyle  . Physical activity    Days per week: Not on file    Minutes per session: Not on file  . Stress: Not on file  Relationships  . Social Herbalist on phone: Not on file    Gets together: Not on file    Attends religious service: Not on file    Active member of club or organization: Not on file    Attends meetings of clubs or organizations: Not on file    Relationship status: Not on file  . Intimate partner violence    Fear of current or ex partner: Not on file    Emotionally abused: Not on file    Physically abused: Not on file    Forced sexual activity: Not on file  Other Topics Concern  . 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  Divorced, living alone in [place].     Family History  Problem Relation Age of Onset  . Breast cancer Mother   . Heart disease Mother   . Hypertension Mother   . Diabetes Father   . Heart disease Father   . Stroke Father   . Heart disease Sister   . Hypertension Sister   . Hypertension Sister   . Hyperlipidemia Sister   . Diabetes Sister   . Allergic rhinitis Sister   . Emphysema Other        great uncle  . Aneurysm Sister        brain  . Angioedema Neg Hx   . Asthma Neg Hx   . Eczema Neg Hx   . Immunodeficiency Neg Hx   . Urticaria Neg Hx     OBJECTIVE:  Vitals:   03/26/19 1426  BP: (!) 187/111  Pulse: 85  Resp: 16  Temp: 98.1 F (36.7 C)  TempSrc: Oral  SpO2: 97%    General appearance: ALERT; appears uncomfortable, but nonotoxic Head: NCAT Lungs: Normal respiratory effort; CTAB to anterior and posterior chest; speaking in full sentences without difficulty CV: RRR  Musculoskeletal: Chest wall Inspection: Skin warm, dry, clear and intact without obvious erythema, effusion, or ecchymosis.  Palpation: TTP over RT lower lateral ribs; TTP with lateral compression of chest ROM: FROM active and passive Skin: warm and dry Neurologic: Ambulates without difficulty; Sensation intact about the upper/ lower extremities Psychological: alert and cooperative; normal mood and affect  DIAGNOSTIC STUDIES:  Dg Ribs Unilateral W/chest Right  Result Date: 03/26/2019 CLINICAL DATA:  Right rib pain after injury 3 days ago. EXAM: RIGHT RIBS AND CHEST - 3+ VIEW COMPARISON:  None. FINDINGS: No fracture or other bone lesions are seen involving the ribs. There is no evidence of pneumothorax or pleural effusion. Minimal right basilar subsegmental atelectasis is noted. Left lung is clear. Heart size and mediastinal contours are within normal limits. IMPRESSION: Normal right ribs.  Minimal right basilar subsegmental atelectasis. Electronically Signed   By: Marijo Conception M.D.   On: 03/26/2019 14:57   X-rays negative for bony abnormalities including fracture, or dislocation.   I have reviewed the x-rays myself and the radiologist interpretation.  I am in agreement with the radiologist interpretation.      ASSESSMENT & PLAN:  1. Rib contusion, right, initial encounter   2. Atelectasis      Meds ordered this encounter  Medications  . naproxen (NAPROSYN) 375 MG tablet    Sig: Take 1 tablet (375 mg total) by mouth 2 (two) times daily.    Dispense:  20 tablet    Refill:  0    Order Specific Question:   Supervising Provider    Answer:   Raylene Everts [id number]   X-rays negative for fracture or dislocation, but did show partial lung collapse Continue conservative management of rest, ice, heat, and gentle stretches/ massage Take naproxen as needed for pain relief (may cause abdominal discomfort, ulcers, and GI bleeds avoid taking with other NSAIDs or citalopram) Continue  with muscle relaxer as prescribed Make an effort to take deep breaths throughout the day to prevent worsening atelectasis Follow up with PCP if symptoms persist Return or go to the ER if you have any new or worsening symptoms (fever, chills, chest pain, shortness of breath, abdominal pain, nausea, vomiting, difficulty breathing, etc...)   Reviewed expectations re: course of current medical issues. Questions answered. Outlined signs and symptoms indicating need for more acute intervention. Patient verbalized understanding. After Visit Summary given.    Lestine Box, PA-C 03/26/19 1701

## 2019-03-26 NOTE — ED Triage Notes (Signed)
Pt presents with right sided rib pain after over reaching at work

## 2019-03-31 DIAGNOSIS — Z794 Long term (current) use of insulin: Secondary | ICD-10-CM | POA: Diagnosis not present

## 2019-03-31 DIAGNOSIS — E1165 Type 2 diabetes mellitus with hyperglycemia: Secondary | ICD-10-CM | POA: Diagnosis not present

## 2019-04-14 DIAGNOSIS — M25551 Pain in right hip: Secondary | ICD-10-CM | POA: Diagnosis not present

## 2019-04-14 DIAGNOSIS — M25552 Pain in left hip: Secondary | ICD-10-CM | POA: Diagnosis not present

## 2019-05-04 ENCOUNTER — Ambulatory Visit: Payer: Medicare Other | Admitting: Cardiovascular Disease

## 2019-05-04 ENCOUNTER — Encounter: Payer: Self-pay | Admitting: Cardiovascular Disease

## 2019-05-04 ENCOUNTER — Other Ambulatory Visit: Payer: Self-pay

## 2019-05-04 DIAGNOSIS — Z720 Tobacco use: Secondary | ICD-10-CM | POA: Insufficient documentation

## 2019-05-04 DIAGNOSIS — E785 Hyperlipidemia, unspecified: Secondary | ICD-10-CM | POA: Diagnosis not present

## 2019-05-04 DIAGNOSIS — I10 Essential (primary) hypertension: Secondary | ICD-10-CM | POA: Diagnosis not present

## 2019-05-04 DIAGNOSIS — I739 Peripheral vascular disease, unspecified: Secondary | ICD-10-CM

## 2019-05-04 DIAGNOSIS — I2510 Atherosclerotic heart disease of native coronary artery without angina pectoris: Secondary | ICD-10-CM

## 2019-05-04 MED ORDER — LOSARTAN POTASSIUM 50 MG PO TABS: 50.0000 mg | ORAL_TABLET | Freq: Every day | ORAL | 3 refills | Status: DC

## 2019-05-04 NOTE — Assessment & Plan Note (Signed)
History of hyperlipidemia on statin therapy with lipid profile performed 02/25/2019 military cholesterol 112, LDL of 51 and HDL 40.

## 2019-05-04 NOTE — Progress Notes (Signed)
05/04/2019 [name]   03-12-1956  [id number]  Primary Physician Jennifer Squibb, MD Primary Cardiologist: Jennifer Harp MD [name], [place]  HPI:  [name] is a 63 y.o.  thin-appearing divorced African-American female mother of one, grandmother of one grandchild who is currently disabled because of a prior stroke. She was referred by Dr. Melony Overly, from [facility] podiatry, for evaluation and treatment of critical limb ischemia. I last saw her in the office  02/25/2018. Her cardiovascular risk factor profile is notable for a strong family history of heart disease with the father about a stent, mother who had bypass surgery and a sister who died at age 37 of a myocardial infarction. She has never had a heart attack but apparently has had a stroke in the past with some mild left-sided residua. She has a history of tobacco abuse in the last 43 years of one third pack per day trying to quit currently. She has treated diabetes, hypertension and hyperlipidemia. She had the onset of left ear pain approximately 3 months ago with progression to critical limb ischemia in early June with ischemic appearing left fourth toe. Dopplers in our office performed yesterday revealed a left ABI 0.6 with an occluded left SFA and one-vessel runoff. She will need to be admitted for angiography and potential endovascular therapy for critical limb ischemia. Angiogram to her on 11/03/14 revealing occluded left SFA. I performed [facility] one directional atherectomy, PTA and stenting using a Viabahn covered stent with an excellent angiographic and clinical result. Her pain has resolved. Her critical limb ischemia has resolved. Her Dopplers have normalized. Since I saw her approximately 6 weeks ago she's had 4 episodes of night nitroglycerin responsive chest pain. Recent Dopplers did show a decline in her left ABI from 1.1 6 months ago to .91 with a simultaneous increase in velocity in her mid left SFA. Dopplers  performed 12/20/15 revealed a further reduction in her left ABI down to 0.71 with a high-frequency signal in her mid left SFA and worsening symptoms of claudication   I performed angiography on her 01/29/2016 revealing an occluded left SFA stent.  She ultimately underwent left femoropopliteal bypass grafting 06/09/2016 by Dr. Trula Slade using PTFE.  He follows his noninvasively in his office.  Her symptoms resolved.  She also underwent cardiac catheterization by Dr. [name] because of chest pain 12/26/2016 revealing minimal CAD.  Unfortunately, she has gone back to smoking 10 cigarettes a day.  Since I saw her in the office a year ago she continues to do well although she continues to smoke as well.  She denies chest pain, shortness of breath or claudication.  She does check her blood pressure at home which runs on the high side.   Current Meds  Medication Sig  . albuterol (PROAIR HFA) 108 (90 Base) MCG/ACT inhaler 1-2 inhalations every 4-6 hours as needed for cough or wheeze. (Patient taking differently: Inhale 1-2 puffs into the lungs every 4 (four) hours as needed for shortness of breath. )  . ALPRAZolam (XANAX) 0.5 MG tablet Take 0.25 mg by mouth daily as needed for anxiety or sleep.   Marland Kitchen amLODipine (NORVASC) 5 MG tablet Take 1 tablet (5 mg total) by mouth daily.  Marland Kitchen aspirin EC 81 MG tablet Take 81 mg by mouth daily.  . Azelastine HCl 0.15 % SOLN Place 2 sprays into both nostrils 2 (two) times daily. (Patient taking differently: Place 2 sprays into both nostrils 2 (two) times daily as  needed (allergies). )  . citalopram (CELEXA) 10 MG tablet Take 10 mg by mouth daily.  . clopidogrel (PLAVIX) 75 MG tablet TAKE 1 TABLET BY MOUTH EVERY DAY WITH SUPPER - NEEDS OFFICE VISIT  . cyclobenzaprine (FLEXERIL) 10 MG tablet Take 10 mg by mouth 3 (three) times daily as needed for muscle spasms.  Marland Kitchen doxycycline (ADOXA) 100 MG tablet Take 100 mg by mouth 2 (two) times daily.  Marland Kitchen EPINEPHrine 0.3 mg/0.3 mL IJ SOAJ injection  Inject 0.3 mLs (0.3 mg total) into the muscle once.  . Fenofibric Acid 105 MG TABS Take 1 tablet by mouth daily.  Marland Kitchen HYDROcodone-acetaminophen (NORCO) 7.5-325 MG tablet   . Insulin Disposable Pump (OMNIPOD DASH 5 PACK PODS) MISC   . insulin lispro (HUMALOG) 100 UNIT/ML injection Inject 5-12 Units into the skin 2 (two) times daily before a meal. Per sliding scale  . isosorbide mononitrate (IMDUR) 30 MG 24 hr tablet Take 1 tablet (30 mg total) by mouth daily.  Marland Kitchen JARDIANCE 25 MG TABS tablet Take 25 mg by mouth daily.  Marland Kitchen loratadine (CLARITIN) 10 MG tablet Take 10 mg by mouth daily as needed for allergies.  Marland Kitchen losartan (COZAAR) 50 MG tablet Take 1 tablet (50 mg total) by mouth daily.  . meclizine (ANTIVERT) 25 MG tablet Take 1 tablet by mouth 3 (three) times daily as needed for dizziness.  . mupirocin cream (BACTROBAN) 2 % Apply 1 application topically 2 (two) times daily as needed (wound care (boils)).   . mupirocin ointment (BACTROBAN) 2 %   . naproxen (NAPROSYN) 375 MG tablet Take 1 tablet (375 mg total) by mouth 2 (two) times daily.  . nitroGLYCERIN (NITROSTAT) 0.4 MG SL tablet PLACE 1 TAB UNDER TONGUE EVERY 5 MINS AS NEEDED FOR CHEST PAIN - MAX 3 DOSES THEN 911  . pantoprazole (PROTONIX) 40 MG tablet TAKE 1 TABLET BY MOUTH EVERY DAY  . propranolol ER (INDERAL LA) 60 MG 24 hr capsule Take 1 capsule (60 mg total) by mouth every evening.  . rosuvastatin (CRESTOR) 40 MG tablet Take 40 mg by mouth daily.  . SUMAtriptan (IMITREX) 50 MG tablet Take 50 mg by mouth every 2 (two) hours as needed for migraine.  . temazepam (RESTORIL) 30 MG capsule Take 30 mg by mouth at bedtime.  . [DISCONTINUED] losartan (COZAAR) 25 MG tablet Take 25 mg by mouth daily.     Allergies  Allergen Reactions  . Sulfa Antibiotics Shortness Of Breath and Palpitations  . 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 Fish Allergy Hives and Swelling    Tongue swelling  . Iodine Swelling    . Metformin And Related Diarrhea  . [name] [Insulin Glargine-Lixisenatide] Itching and Other (See Comments)    "tongue swelling"  . Adhesive [Tape] Rash    Paper tape is ok  . Shellfish Allergy Swelling and Rash    Tongue swelling    Social History   Socioeconomic History  . Marital status: Divorced    Spouse name: Not on file  . Number of children: 1  . Years of education: Not on file  . Highest education level: Not on file  Occupational History    Employer: UNEMPLOYED  Tobacco Use  . Smoking status: Former Smoker    Packs/day: 0.33    Years: 41.00    Pack years: 13.53    Types: Cigarettes    Quit date: 01/15/2016    Years since quitting: 3.3  .  Smokeless tobacco: Never Used  . Tobacco comment: Getting ready to start nicotine patches RX by Dr. Gwenlyn Found per pt.  Substance and Sexual Activity  . Alcohol use: No    Alcohol/week: 0.0 standard drinks  . Drug use: No    Types: "Crack" cocaine    Comment: 05/09/2015.  "quit 07/27/1994"  . Sexual activity: Not Currently    Birth control/protection: Abstinence  Other Topics Concern  . 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  Divorced, living alone in [place].     Social Determinants of Health   Financial Resource Strain:   . Difficulty of Paying Living Expenses: Not on file  Food Insecurity:   . Worried About Charity fundraiser in the Last Year: Not on file  . Ran Out of Food in the Last Year: Not on file  Transportation Needs:   . Lack of Transportation (Medical): Not on file  . Lack of Transportation (Non-Medical): Not on file  Physical Activity:   . Days of Exercise per Week: Not on file  . Minutes of Exercise per Session: Not on file  Stress:   . Feeling of Stress : Not on file  Social Connections:   . Frequency of Communication with Friends and Family: Not on file  . Frequency of Social  Gatherings with Friends and Family: Not on file  . Attends Religious Services: Not on file  . Active Member of Clubs or Organizations: Not on file  . Attends Archivist Meetings: Not on file  . Marital Status: Not on file  Intimate Partner Violence:   . Fear of Current or Ex-Partner: Not on file  . Emotionally Abused: Not on file  . Physically Abused: Not on file  . Sexually Abused: Not on file     Review of Systems: General: negative for chills, fever, night sweats or weight changes.  Cardiovascular: negative for chest pain, dyspnea on exertion, edema, orthopnea, palpitations, paroxysmal nocturnal dyspnea or shortness of breath Dermatological: negative for rash Respiratory: negative for cough or wheezing Urologic: negative for hematuria Abdominal: negative for nausea, vomiting, diarrhea, bright red blood per rectum, melena, or hematemesis Neurologic: negative for visual changes, syncope, or dizziness All other systems reviewed and are otherwise negative except as noted above.    Blood pressure (!) 141/89, pulse (!) 109, temperature (!) 93.2 F (34 C), height 5\' 4"  (1.626 m), weight 143 lb 9.6 oz (65.1 kg), SpO2 100 %.  General appearance: alert and no distress Neck: no adenopathy, no carotid bruit, no JVD, supple, symmetrical, trachea midline and thyroid not enlarged, symmetric, no tenderness/mass/nodules Lungs: clear to auscultation bilaterally Heart: regular rate and rhythm, S1, S2 normal, no murmur, click, rub or gallop Extremities: extremities normal, atraumatic, no cyanosis or edema Pulses: 2+ and symmetric Skin: Skin color, texture, turgor normal. No rashes or lesions Neurologic: Alert and oriented X 3, normal strength and tone. Normal symmetric reflexes. Normal coordination and gait  EKG sinus tachycardia 105 with septal Q waves.  I personally reviewed this EKG.  ASSESSMENT AND PLAN:   Essential hypertension History of essential hypertension blood pressure  measured at 141/89.  She checks her blood pressure at home and it is pretty similar to this.  She is on amlodipine, losartan and propranolol.  I am going to increase her losartan from 25 to 50 mg a day, will check a basic metabolic  panel in 2 weeks.  Of asked to keep a blood pressure log for 30 days and follow-up with Cyril Mourning  PAD (peripheral artery disease) ([place]) History of PAD status post peripheral angiography by myself 11/03/2014 revealing occluded left SFA.  She did have critical limb ischemia with an ABI of 0.6.  She underwent directional atherectomy, PTA and stenting using a Viabahn covered stent with an excellent clinical result.  Pain resolved.  Critical limb ischemia resolved as well.  Dopplers normalized.  Because of worsening symptoms I would reangiogrammed her 01/29/2016 revealing an occluded left SFA stent.  She ultimately underwent left femoropopliteal bypass grafting by Dr. Trula Slade using PTFE 06/09/2016 which he follows in this office noninvasively.  Hyperlipidemia LDL goal <70 History of hyperlipidemia on statin therapy with lipid profile performed 02/25/2019 military cholesterol 112, LDL of 51 and HDL 40.  Mild CAD History of mild CAD performed by cardiac catheterization performed by Dr. [name] 12/26/2016.  She denies chest pain or shortness of breath.  Tobacco abuse History of ongoing tobacco abuse of approximately 10 cigarettes a day recalcitrant to risk factor modification.      Jennifer Harp MD FACP,FACC,FAHA, [facility] 05/04/2019 2:31 PM

## 2019-05-04 NOTE — Assessment & Plan Note (Signed)
History of ongoing tobacco abuse of approximately 10 cigarettes a day recalcitrant to risk factor modification.

## 2019-05-04 NOTE — Assessment & Plan Note (Signed)
History of essential hypertension blood pressure measured at 141/89.  She checks her blood pressure at home and it is pretty similar to this.  She is on amlodipine, losartan and propranolol.  I am going to increase her losartan from 25 to 50 mg a day, will check a basic metabolic panel in 2 weeks.  Of asked to keep a blood pressure log for 30 days and follow-up with Cyril Mourning

## 2019-05-04 NOTE — Assessment & Plan Note (Signed)
History of PAD status post peripheral angiography by myself 11/03/2014 revealing occluded left SFA.  She did have critical limb ischemia with an ABI of 0.6.  She underwent directional atherectomy, PTA and stenting using a Viabahn covered stent with an excellent clinical result.  Pain resolved.  Critical limb ischemia resolved as well.  Dopplers normalized.  Because of worsening symptoms I would reangiogrammed her 01/29/2016 revealing an occluded left SFA stent.  She ultimately underwent left femoropopliteal bypass grafting by Dr. Trula Slade using PTFE 06/09/2016 which he follows in this office noninvasively.

## 2019-05-04 NOTE — Patient Instructions (Signed)
Medication Instructions:  Increase Losartan to 50mg  Daily.  If you need a refill on your cardiac medications before your next appointment, please call your pharmacy.   Lab work: BMET in 2 weeks If you have labs (blood work) drawn today and your tests are completely normal, you will receive your results only by: [place] (if you have MyChart) OR A paper copy in the mail If you have any lab test that is abnormal or we need to change your treatment, we will call you to review the results.  Testing/Procedures: NONE  Follow-Up: At [facility], you and your health needs are our priority.  As part of our continuing mission to provide you with exceptional heart care, we have created designated Provider Care Teams.  These Care Teams include your primary Cardiologist (physician) and Advanced Practice Providers (APPs -  Physician Assistants and Nurse Practitioners) who all work together to provide you with the care you need, when you need it. You may see Quay Burow, MD or one of the following Advanced Practice Providers on your designated Care Team:    Kerin Ransom, PA-C  [place], [place]  Coletta Memos, [place]  Your physician wants you to follow-up in: 1 month with Erasmo Downer in [facility] for BP log. Your physician wants you to follow-up in: 1 year with Dr Gwenlyn Found   Any Other Special Instructions Will Be Listed Below (If Applicable). Keep a Blood Pressure log for 30 days and follow-up with Erasmo Downer in [facility] after.

## 2019-05-04 NOTE — Assessment & Plan Note (Signed)
History of mild CAD performed by cardiac catheterization performed by Dr. [name] 12/26/2016.  She denies chest pain or shortness of breath.

## 2019-05-05 DIAGNOSIS — R21 Rash and other nonspecific skin eruption: Secondary | ICD-10-CM | POA: Diagnosis not present

## 2019-05-05 DIAGNOSIS — L03313 Cellulitis of chest wall: Secondary | ICD-10-CM | POA: Diagnosis not present

## 2019-05-05 DIAGNOSIS — J060 Acute laryngopharyngitis: Secondary | ICD-10-CM | POA: Diagnosis not present

## 2019-05-05 DIAGNOSIS — R140 Abdominal distension (gaseous): Secondary | ICD-10-CM | POA: Diagnosis not present

## 2019-05-05 DIAGNOSIS — T783XXA Angioneurotic edema, initial encounter: Secondary | ICD-10-CM | POA: Diagnosis not present

## 2019-05-10 DIAGNOSIS — I10 Essential (primary) hypertension: Secondary | ICD-10-CM | POA: Diagnosis not present

## 2019-05-19 DIAGNOSIS — I10 Essential (primary) hypertension: Secondary | ICD-10-CM | POA: Diagnosis not present

## 2019-05-19 DIAGNOSIS — G43019 Migraine without aura, intractable, without status migrainosus: Secondary | ICD-10-CM | POA: Diagnosis not present

## 2019-06-02 ENCOUNTER — Other Ambulatory Visit: Payer: Self-pay

## 2019-06-02 ENCOUNTER — Ambulatory Visit
Admission: EM | Admit: 2019-06-02 | Discharge: 2019-06-02 | Disposition: A | Discharge status: Home / Self Care | Payer: Medicare Other | Attending: Emergency Medicine | Admitting: Emergency Medicine

## 2019-06-02 DIAGNOSIS — S30861A Insect bite (nonvenomous) of abdominal wall, initial encounter: Secondary | ICD-10-CM | POA: Diagnosis not present

## 2019-06-02 DIAGNOSIS — W57XXXA Bitten or stung by nonvenomous insect and other nonvenomous arthropods, initial encounter: Secondary | ICD-10-CM | POA: Diagnosis not present

## 2019-06-02 MED ORDER — MUPIROCIN 2 % EX OINT: 1.0000 "application " | TOPICAL_OINTMENT | Freq: Two times a day (BID) | CUTANEOUS | 1 refills | Status: DC

## 2019-06-02 NOTE — Discharge Instructions (Signed)
Wash daily with warm water and mild soap bactroban ointment prescribed.  Use as instructed Follow up with PCP if symptoms persists Return or go to the ER if you have any new or worsening symptoms such as fever, chills, nausea, vomiting, redness, swelling, discharge, if symptoms do not improve with medications, etc..Marland Kitchen

## 2019-06-02 NOTE — ED Triage Notes (Signed)
Pt presents to UC w/ c/o possible insect bite on left hip. Pt denies pain. States it is slightly itchy. Denies bleeding or drainage. Pt took benadryl last night and put neosporin on it.

## 2019-06-02 NOTE — ED Provider Notes (Signed)
[place]   [id number] 06/02/19 Arrival Time: 1059  CC: "bug bite"  SUBJECTIVE:  [name] is a 64 y.o. female who presents with a insect bite to LT lateral abdomen/ hip x 1 day.  Denies precipitating event or trauma.  Speculates an insect may have bit her.  Localizes the bite to LT lateral hip.  Describes it as mildly painful and itchy.  Has tried OTC neosporin with relief.  Symptoms are made worse to the touch.  Reports hx of brown recluse bite when she was 36.   Denies fever, chills, nausea, vomiting, erythema, swelling, discharge, SOB, chest pain, abdominal pain, changes in bowel or bladder function.    ROS: As per HPI.  All other pertinent ROS negative.     Past Medical History:  Diagnosis Date  . 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 Anemia    hx  . Angioedema 07/10/2015  . Anxiety   . Arthritis    "neck, left hand" (09/14/2012)  . Asthma   . Cancer ([place]) 1985   ovarian, no treatment except surgery  . Complication of anesthesia    OCCASIONAL TROUBLE TURNING NECK TO RIGHT  . Critical lower limb ischemia    10/2014 s/p L SFA stenting  . Diverticulosis of colon with hemorrhage April 2013  . GERD (gastroesophageal reflux disease)   . H/O hiatal hernia   . Heart attack [facility])    2003 mild MI, March 2013 mild MI  . Hidradenitis    groin  . History of blood transfusion 1985 AND 2013  . Hyperlipidemia   . Hypertension   . Irritable bowel syndrome   . Left-sided weakness    since stroke, left eye trouble seeing  . Migraines   . Mild CAD    a. Cath 09/2010: mild luminal irregularities of LAD, 30% prox RCA and 20-30% mRCA, EF 65%.  . Neuropathy   . Obesity   . PAD (peripheral artery disease) ([place])    a. critical limb ischemia s/p PTA/stenting of L SFA 10/2014. c. occ prior SFA stent by angio 01/2016, for possible PV bypass.  . Pneumonia    baby  . Recurrent upper respiratory infection (URI)   . S/P arterial stent-mid Lt SFA 11/03/14 11/04/2014  . Schatzki's ring    . Sinus problem   . Stomach ulcer 1972   non-bleeding  . Stroke ([place]) 07-2007, 07-2008, 07-2009   total 3 strokes; mild left sided weakness and left eye "jumps".  . Type II diabetes mellitus ([place])   . Vitamin B 12 deficiency 07-15-2013   Past Surgical History:  Procedure Laterality Date  . ABDOMINAL HYSTERECTOMY  1985  . ADENOIDECTOMY    . ANTERIOR CERVICAL DECOMP/DISCECTOMY FUSION  2002  . ANTERIOR CERVICAL DECOMP/DISCECTOMY FUSION N/A 04/08/2014   Procedure: Cervical Six-Seven ANTERIOR CERVICAL DECOMPRESSION/DISCECTOMY FUSION Plating and Bonegraft  2 LEVELS;  Surgeon: Ashok Pall, MD;  Location: [place] NEURO ORS;  Service: Neurosurgery;  Laterality: N/A;  Cervical Six-Seven ANTERIOR CERVICAL DECOMPRESSION/DISCECTOMY FUSION Plating and Bonegraft  2 LEVELS  . APPENDECTOMY  1985  . AXILLARY HIDRADENITIS EXCISION  1990-2008   bilateral  . BACK SURGERY    . BREAST BIOPSY Right 2007  . BREAST CYST EXCISION Right 2008  . BREAST REDUCTION SURGERY    . CARDIAC CATHETERIZATION  2004   mild disease  . CATARACT EXTRACTION W/PHACO Left 05/16/2015   Procedure: CATARACT EXTRACTION PHACO AND INTRAOCULAR LENS PLACEMENT (IOC);  Surgeon: Rutherford Guys, MD;  Location: [facility];  Service: Ophthalmology;  Laterality: Left;  CDE: 4.24  . CATARACT EXTRACTION W/PHACO Right 05/30/2015   Procedure: CATARACT EXTRACTION RIGHT EYE PHACO AND INTRAOCULAR LENS PLACEMENT ;  Surgeon: Rutherford Guys, MD;  Location: [facility];  Service: Ophthalmology;  Laterality: Right;  CDE:4.08  . CHOLECYSTECTOMY  1990's  . COLONOSCOPY  08/12/2011   Procedure: COLONOSCOPY;  Surgeon: Ladene Artist, MD,FACG;  Location: [facility] ENDOSCOPY;  Service: Endoscopy;  Laterality: N/A;  . COLONOSCOPY  06/19/2006  . cyst thigh Right   . ESOPHAGOGASTRODUODENOSCOPY  08/12/2011   Procedure: ESOPHAGOGASTRODUODENOSCOPY (EGD);  Surgeon: Ladene Artist, MD,FACG;  Location: [facility] ENDOSCOPY;  Service: Endoscopy;  Laterality: N/A;  . ESOPHAGOGASTRODUODENOSCOPY  06/04/2005  .  FEMORAL-POPLITEAL BYPASS GRAFT Left 06/19/2016   Procedure: Left Leg BYPASS GRAFT FEMORAL-POPLITEAL ARTERY;  Surgeon: Serafina Mitchell, MD;  Location: [place];  Service: Vascular;  Laterality: Left;  . GIVENS CAPSULE STUDY  08/13/2011   Procedure: GIVENS CAPSULE STUDY;  Surgeon: Ladene Artist, MD,FACG;  Location: [facility] ENDOSCOPY;  Service: Endoscopy;  Laterality: N/A;  . HAMMER TOE SURGERY Bilateral ~ 2000  . HIATAL HERNIA REPAIR    . HYDRADENITIS EXCISION  01/2011; 03/2012   'groin and abdomen; 03/2012" (09/14/2012)  . HYDRADENITIS EXCISION  04/01/2012   Procedure: EXCISION HYDRADENITIS GROIN;  Surgeon: Pedro Earls, MD;  Location: [facility] ORS;  Service: General;  Laterality: Bilateral;  Excision of Hydradenitis of Perineum  . HYDRADENITIS EXCISION N/A 09/17/2013   Procedure: EXCISION PERINEAL HIDRADENITIS ;  Surgeon: Pedro Earls, MD;  Location: [facility]S;  Service: General;  Laterality: N/A;  also in the pubis area  . LEFT HEART CATH AND CORONARY ANGIOGRAPHY N/A 12/26/2016   Procedure: LEFT HEART CATH AND CORONARY ANGIOGRAPHY;  Surgeon: [name], Peter M, MD;  Location: [place] CV LAB;  Service: Cardiovascular;  Laterality: N/A;  . MASS EXCISION Right 09/17/2013   Procedure: EXCISION MASS;  Surgeon: Pedro Earls, MD;  Location: [facility] ORS;  Service: General;  Laterality: Right;  . NISSEN FUNDOPLICATION  99991111  . PERIPHERAL VASCULAR CATHETERIZATION N/A 11/03/2014   Procedure: Lower Extremity Angiography;  Surgeon: Lorretta Harp, MD;  Location: [place] CV LAB;  Service: Cardiovascular;  Laterality: N/A;  . PERIPHERAL VASCULAR CATHETERIZATION N/A 01/29/2016   Procedure: Lower Extremity Angiography;  Surgeon: Lorretta Harp, MD;  Location: [place] CV LAB;  Service: Cardiovascular;  Laterality: N/A;  . POSTERIOR LUMBAR FUSION  2008 X 2  . REDUCTION MAMMAPLASTY  1996?  . TONSILLECTOMY AND ADENOIDECTOMY  1959 AND 2000  . UVULOPALATOPHARYNGOPLASTY, TONSILLECTOMY AND SEPTOPLASTY  2000's    Allergies  Allergen Reactions  . Sulfa Antibiotics Shortness Of Breath and Palpitations  . 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 Fish Allergy Hives and Swelling    Tongue swelling  . Iodine Swelling  . Metformin And Related Diarrhea  . [name] [Insulin Glargine-Lixisenatide] Itching and Other (See Comments)    "tongue swelling"  . Adhesive [Tape] Rash    Paper tape is ok  . Shellfish Allergy Swelling and Rash    Tongue swelling   No current facility-administered medications on file prior to encounter.   Current Outpatient Medications on File Prior to Encounter  Medication Sig Dispense Refill  . albuterol (PROAIR HFA) 108 (90 Base) MCG/ACT inhaler 1-2 inhalations every 4-6 hours as needed for cough or wheeze. (Patient taking differently: Inhale 1-2 puffs into the lungs every 4 (four) hours as needed for shortness of breath. ) 1  Inhaler 1  . ALPRAZolam (XANAX) 0.5 MG tablet Take 0.25 mg by mouth daily as needed for anxiety or sleep.     Marland Kitchen amLODipine (NORVASC) 5 MG tablet Take 1 tablet (5 mg total) by mouth daily. 30 tablet 11  . aspirin EC 81 MG tablet Take 81 mg by mouth daily.    . Azelastine HCl 0.15 % SOLN Place 2 sprays into both nostrils 2 (two) times daily. (Patient taking differently: Place 2 sprays into both nostrils 2 (two) times daily as needed (allergies). ) 30 mL 5  . citalopram (CELEXA) 10 MG tablet Take 10 mg by mouth daily.    . clopidogrel (PLAVIX) 75 MG tablet TAKE 1 TABLET BY MOUTH EVERY DAY WITH SUPPER - NEEDS OFFICE VISIT 30 tablet 11  . cyclobenzaprine (FLEXERIL) 10 MG tablet Take 10 mg by mouth 3 (three) times daily as needed for muscle spasms.    Marland Kitchen EPINEPHrine 0.3 mg/0.3 mL IJ SOAJ injection Inject 0.3 mLs (0.3 mg total) into the muscle once. 1 Device 1  . Fenofibric Acid 105 MG TABS Take 1 tablet by mouth daily.    Marland Kitchen HYDROcodone-acetaminophen (NORCO) 7.5-325 MG tablet     . Insulin Disposable Pump (OMNIPOD DASH 5  PACK PODS) MISC     . insulin lispro (HUMALOG) 100 UNIT/ML injection Inject 5-12 Units into the skin 2 (two) times daily before a meal. Per sliding scale    . isosorbide mononitrate (IMDUR) 30 MG 24 hr tablet Take 1 tablet (30 mg total) by mouth daily. 30 tablet 5  . JARDIANCE 25 MG TABS tablet Take 25 mg by mouth daily.    Marland Kitchen loratadine (CLARITIN) 10 MG tablet Take 10 mg by mouth daily as needed for allergies.    Marland Kitchen losartan (COZAAR) 50 MG tablet Take 1 tablet (50 mg total) by mouth daily. 90 tablet 3  . meclizine (ANTIVERT) 25 MG tablet Take 1 tablet by mouth 3 (three) times daily as needed for dizziness.  3  . mupirocin cream (BACTROBAN) 2 % Apply 1 application topically 2 (two) times daily as needed (wound care (boils)).     . naproxen (NAPROSYN) 375 MG tablet Take 1 tablet (375 mg total) by mouth 2 (two) times daily. 20 tablet 0  . nitroGLYCERIN (NITROSTAT) 0.4 MG SL tablet PLACE 1 TAB UNDER TONGUE EVERY 5 MINS AS NEEDED FOR CHEST PAIN - MAX 3 DOSES THEN 911 25 tablet 2  . pantoprazole (PROTONIX) 40 MG tablet TAKE 1 TABLET BY MOUTH EVERY DAY 30 tablet 11  . propranolol ER (INDERAL LA) 60 MG 24 hr capsule Take 1 capsule (60 mg total) by mouth every evening. 30 capsule 5  . rosuvastatin (CRESTOR) 40 MG tablet Take 40 mg by mouth daily.    . SUMAtriptan (IMITREX) 50 MG tablet Take 50 mg by mouth every 2 (two) hours as needed for migraine.    . temazepam (RESTORIL) 30 MG capsule Take 30 mg by mouth at bedtime.     Social History   Socioeconomic History  . Marital status: Divorced    Spouse name: Not on file  . Number of children: 1  . Years of education: Not on file  . Highest education level: Not on file  Occupational History    Employer: UNEMPLOYED  Tobacco Use  . Smoking status: Former Smoker    Packs/day: 0.33    Years: 41.00    Pack years: 13.53    Types: Cigarettes    Quit date: 01/15/2016  Years since quitting: 3.3  . Smokeless tobacco: Never Used  . Tobacco comment: Getting  ready to start nicotine patches RX by Dr. Gwenlyn Found per pt.  Substance and Sexual Activity  . Alcohol use: No    Alcohol/week: 0.0 standard drinks  . Drug use: No    Types: "Crack" cocaine    Comment: 05/09/2015.  "quit 07/27/1994"  . Sexual activity: Not Currently    Birth control/protection: Abstinence  Other Topics Concern  . 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  Divorced, living alone in [place].     Social Determinants of Health   Financial Resource Strain:   . Difficulty of Paying Living Expenses: Not on file  Food Insecurity:   . Worried About Charity fundraiser in the Last Year: Not on file  . Ran Out of Food in the Last Year: Not on file  Transportation Needs:   . Lack of Transportation (Medical): Not on file  . Lack of Transportation (Non-Medical): Not on file  Physical Activity:   . Days of Exercise per Week: Not on file  . Minutes of Exercise per Session: Not on file  Stress:   . Feeling of Stress : Not on file  Social Connections:   . Frequency of Communication with Friends and Family: Not on file  . Frequency of Social Gatherings with Friends and Family: Not on file  . Attends Religious Services: Not on file  . Active Member of Clubs or Organizations: Not on file  . Attends Archivist Meetings: Not on file  . Marital Status: Not on file  Intimate Partner Violence:   . Fear of Current or Ex-Partner: Not on file  . Emotionally Abused: Not on file  . Physically Abused: Not on file  . Sexually Abused: Not on file   Family History  Problem Relation Age of Onset  . Breast cancer Mother   . Heart disease Mother   . Hypertension Mother   . Diabetes Father   . Heart disease Father   . Stroke Father   . Heart disease Sister   . Hypertension Sister   . Hypertension Sister   . Hyperlipidemia Sister   . Diabetes Sister   .  Allergic rhinitis Sister   . Emphysema Other        great uncle  . Aneurysm Sister        brain  . Angioedema Neg Hx   . Asthma Neg Hx   . Eczema Neg Hx   . Immunodeficiency Neg Hx   . Urticaria Neg Hx     OBJECTIVE: Vitals:   06/02/19 1112  BP: (!) 166/91  Pulse: 100  Resp: 16  Temp: 98.4 F (36.9 C)  TempSrc: Oral  SpO2: 97%    General appearance: alert; no distress Head: NCAT Lungs: normal respiratory effort Extremities: no edema Skin: warm and dry; 0.5 cm round mildly erythematous ecchymotic macule localized to LT lateral abdomen, NTTP, no bleeding or drainage Psychological: alert and cooperative; normal mood and affect  ASSESSMENT & PLAN:  1. Insect bite of abdominal wall, initial encounter     Meds ordered this encounter  Medications  . mupirocin ointment (BACTROBAN) 2 %    Sig: Apply 1 application topically 2 (two) times daily.    Dispense:  30 g    Refill:  1    Order Specific Question:  Supervising Provider    Answer:   Raylene Everts [id number]    Wash daily with warm water and mild soap bactroban ointment prescribed.  Use as instructed Follow up with PCP if symptoms persists Return or go to the ER if you have any new or worsening symptoms such as fever, chills, nausea, vomiting, redness, swelling, discharge, if symptoms do not improve with medications, etc...  Reviewed expectations re: course of current medical issues. Questions answered. Outlined signs and symptoms indicating need for more acute intervention. Patient verbalized understanding. After Visit Summary given.   Lestine Box, PA-C 06/02/19 1135

## 2019-06-05 NOTE — Progress Notes (Signed)
06/08/2019 [name] 23-May-1955 [id number]   HPI:  [name] is a 64 y.o. female patient of Dr Gwenlyn Found, with a PMH below who presents today for hypertension clinic evaluation.  She has been followed by Dr. Gwenlyn Found for some time for PAD.  In the past several months she has noted higher blood pressure readings and has been followed by both Dr. Gwenlyn Found and Dr. Casimer Leek in [place].  She is a poor historian when it comes to her medications, and because Dr. [place] Crane does not use Epic, we have some confusion about which medications she actually takes.    Patient states she was on losartan, with elevated pressures in her right arm (she has always checked this arm).  Dr. Gwenlyn Found increased the losartan from 25 to 50 mg.  Her chart indicated that she was also on amlodipine, however she does not recall taking this.  She then saw the PA at Dr. Juel Burrow office in early January.   They added propranolol LA 60 mg daily.  Patient returned to that office after 10 day days, complaining of headaches she believed were caused by the beta blocker.  Blood pressure was still elevated, so the propranolol was discontinued and she was given clonidine 0.1 mg qhs.  She is following up with [place] today.    Patient reports feeling well, but notes that the clonidine makes her rather drowsy at night.  She believes that her blood pressure is hard to control and is frustrated that it is not yet at goal.  She checked her home meter against the one at Dr Juel Burrow office and notes that it read within 5 points.    Past Medical History: PAD/Critical limb ischemia Atherectomy and stent to SFA 2015; stent occlusion in 2017 led to fem-pop bypass  hyperlipidemia 2020: TC 112, TG 134, HDL 40, LDL 51  DM2  Last A1c 9.3 02/2019  stroke Prior to 2016, residual left sided weakness, now doing well  Tobacco abuse Still 1/2 ppd, quit for short time, but restarted with COVID lockdown     Blood Pressure Goal:  130/80  Current Medications:  amlodipine 5, losartan 50 mg   Family Hx: father had CHF died at 51; mother had CABG, CHF at end of life died at 20; sister died from MI at 25; noather sister with 2 mi, mutiple stents; last sister with heart disease;  Daughter with lipids. htn  Social Hx: 1/2 ppd currently, restarted with COVID (has lost 3 cousins already due to this); occasional glass of wine; 1 coffee each monring, no tea, some Coke  Diet: mix of both, cooks regularly, no salt, but occasional seaoning salts; plenty of veggies -all 3; no fish,; stopped snacking recently  Exercise: yoga, meditation - just started; you tube videos for exercise  Home BP readings:  Has been checking home readings once or twice daily since for past month.  Last 20 readings show average of 157/96 with range of 137-178/85-107  Intolerances: 148/96 128/80  Labs:  0.98 10/20  Wt Readings from Last 3 Encounters:  06/08/19 140 lb (63.5 kg)  05/04/19 143 lb 9.6 oz (65.1 kg)  03/15/19 142 lb (64.4 kg)   BP Readings from Last 3 Encounters:  06/08/19 (!) 148/96  06/02/19 (!) 166/91  05/04/19 (!) 141/89   Pulse Readings from Last 3 Encounters:  06/08/19 86  06/02/19 100  05/04/19 (!) 109    Current Outpatient Medications  Medication Sig Dispense Refill  . albuterol (PROAIR  HFA) 108 (90 Base) MCG/ACT inhaler 1-2 inhalations every 4-6 hours as needed for cough or wheeze. (Patient taking differently: Inhale 1-2 puffs into the lungs every 4 (four) hours as needed for shortness of breath. ) 1 Inhaler 1  . ALPRAZolam (XANAX) 0.5 MG tablet Take 0.25 mg by mouth daily as needed for anxiety or sleep.     Marland Kitchen aspirin EC 81 MG tablet Take 81 mg by mouth daily.    . Azelastine HCl 0.15 % SOLN Place 2 sprays into both nostrils 2 (two) times daily. (Patient taking differently: Place 2 sprays into both nostrils 2 (two) times daily as needed (allergies). ) 30 mL 5  . citalopram (CELEXA) 10 MG tablet Take 10 mg by mouth daily.    . clopidogrel (PLAVIX) 75 MG  tablet TAKE 1 TABLET BY MOUTH EVERY DAY WITH SUPPER - NEEDS OFFICE VISIT 30 tablet 11  . cyclobenzaprine (FLEXERIL) 10 MG tablet Take 10 mg by mouth 3 (three) times daily as needed for muscle spasms.    Marland Kitchen EPINEPHrine 0.3 mg/0.3 mL IJ SOAJ injection Inject 0.3 mLs (0.3 mg total) into the muscle once. 1 Device 1  . Fenofibric Acid 105 MG TABS Take 1 tablet by mouth daily.    Marland Kitchen HYDROcodone-acetaminophen (NORCO) 7.5-325 MG tablet     . Insulin Disposable Pump (OMNIPOD DASH 5 PACK PODS) MISC     . insulin lispro (HUMALOG) 100 UNIT/ML injection Inject 5-12 Units into the skin 2 (two) times daily before a meal. Per sliding scale    . isosorbide mononitrate (IMDUR) 30 MG 24 hr tablet Take 1 tablet (30 mg total) by mouth daily. 30 tablet 5  . loratadine (CLARITIN) 10 MG tablet Take 10 mg by mouth daily as needed for allergies.    Marland Kitchen losartan (COZAAR) 50 MG tablet Take 1 tablet (50 mg total) by mouth daily. 90 tablet 3  . mupirocin cream (BACTROBAN) 2 % Apply 1 application topically 2 (two) times daily as needed (wound care (boils)).     . mupirocin ointment (BACTROBAN) 2 % Apply 1 application topically 2 (two) times daily. 30 g 1  . naproxen (NAPROSYN) 375 MG tablet Take 1 tablet (375 mg total) by mouth 2 (two) times daily. 20 tablet 0  . nitroGLYCERIN (NITROSTAT) 0.4 MG SL tablet PLACE 1 TAB UNDER TONGUE EVERY 5 MINS AS NEEDED FOR CHEST PAIN - MAX 3 DOSES THEN 911 25 tablet 2  . pantoprazole (PROTONIX) 40 MG tablet TAKE 1 TABLET BY MOUTH EVERY DAY 30 tablet 11  . propranolol ER (INDERAL LA) 60 MG 24 hr capsule Take 1 capsule (60 mg total) by mouth every evening. 30 capsule 5  . rosuvastatin (CRESTOR) 40 MG tablet Take 40 mg by mouth daily.    . SUMAtriptan (IMITREX) 50 MG tablet Take 50 mg by mouth every 2 (two) hours as needed for migraine.    . temazepam (RESTORIL) 30 MG capsule Take 30 mg by mouth at bedtime.    . chlorthalidone (HYGROTON) 25 MG tablet Take 1 tablet (25 mg total) by mouth daily. 30  tablet 5   No current facility-administered medications for this visit.    Allergies  Allergen Reactions  . Sulfa Antibiotics Shortness Of Breath and Palpitations  . 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 Fish Allergy Hives and Swelling    Tongue swelling  . Iodine Swelling  . Metformin And Related Diarrhea  . [name] [Insulin Glargine-Lixisenatide] Itching  and Other (See Comments)    "tongue swelling"  . Adhesive [Tape] Rash    Paper tape is ok  . Shellfish Allergy Swelling and Rash    Tongue swelling    Past Medical History:  Diagnosis Date  . 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 Anemia    hx  . Angioedema 07/10/2015  . Anxiety   . Arthritis    "neck, left hand" (09/14/2012)  . Asthma   . Cancer ([place]) 1985   ovarian, no treatment except surgery  . Complication of anesthesia    OCCASIONAL TROUBLE TURNING NECK TO RIGHT  . Critical lower limb ischemia    10/2014 s/p L SFA stenting  . Diverticulosis of colon with hemorrhage April 2013  . GERD (gastroesophageal reflux disease)   . H/O hiatal hernia   . Heart attack [facility])    2003 mild MI, March 2013 mild MI  . Hidradenitis    groin  . History of blood transfusion 1985 AND 2013  . Hyperlipidemia   . Hypertension   . Irritable bowel syndrome   . Left-sided weakness    since stroke, left eye trouble seeing  . Migraines   . Mild CAD    a. Cath 09/2010: mild luminal irregularities of LAD, 30% prox RCA and 20-30% mRCA, EF 65%.  . Neuropathy   . Obesity   . PAD (peripheral artery disease) ([place])    a. critical limb ischemia s/p PTA/stenting of L SFA 10/2014. c. occ prior SFA stent by angio 01/2016, for possible PV bypass.  . Pneumonia    baby  . Recurrent upper respiratory infection (URI)   . S/P arterial stent-mid Lt SFA 11/03/14 11/04/2014  . Schatzki's ring   . Sinus problem   . Stomach ulcer 1972   non-bleeding  . Stroke ([place]) 07-2007, 07-2008, 07-2009   total 3 strokes; mild  left sided weakness and left eye "jumps".  . Type II diabetes mellitus ([place])   . Vitamin B 12 deficiency 07-15-2013    Blood pressure (!) 148/96, pulse 86, height 5\' 4"  (1.626 m), weight 140 lb (63.5 kg).  BP in left arm 128/80  Essential hypertension Patient with uncontrolled systolic/diastolic hypertension, with 20 point difference between right and left arms.  She has a history of PAD, so I reviewed this with Dr. Gwenlyn Found.  Also called Dr Juel Burrow office and spoke with PA RN to get full picture.  Had long conversation with patient regarding hypertension and the fact that she will most likely need 2-3 medications for best control.  Will start her on chlorthalidone 25 mg daily to take with the losartan 50 mg.  Will need to check metabolic panel in 2 weeks.  She is to check home BP readings twice daily in both arms.  Will see her back in the office in 3 weeks for follow up.  If her BP readings continue to have a greater than 20 mmHg difference between right and left will evaluate for symptoms of claudication.  Per Dr. Gwenlyn Found if both of these, will order dopplers to check out blood flow.  In 3 weeks should her right arm readings continue to be elevated, would probably add carvedilol, to help with both BP and elevated heart rate readings.  Unsure as to why amlodipine was discontinued, or if patient just didn't refill at some time.  May consider adding back in at a later time.     Tommy Medal PharmD CPP [facility] [address]  [place], [place] [address] [phone]

## 2019-06-08 ENCOUNTER — Other Ambulatory Visit: Payer: Self-pay

## 2019-06-08 ENCOUNTER — Ambulatory Visit: Payer: Medicare Other | Admitting: Pharmacist Clinician (PhC)/ Clinical Pharmacy Specialist

## 2019-06-08 ENCOUNTER — Encounter: Payer: Self-pay | Admitting: Pharmacist Clinician (PhC)/ Clinical Pharmacy Specialist

## 2019-06-08 DIAGNOSIS — I10 Essential (primary) hypertension: Secondary | ICD-10-CM | POA: Diagnosis not present

## 2019-06-08 MED ORDER — CHLORTHALIDONE 25 MG PO TABS: 25.0000 mg | ORAL_TABLET | Freq: Every day | ORAL | 5 refills | Status: DC

## 2019-06-08 NOTE — Patient Instructions (Addendum)
Return for a a follow up appointment February 23  Go to the lab in 2 weeks to check kidney function  Your blood pressure today is 148/96  Check your blood pressure at home twice daily and keep record of the readings.  Take your BP meds as follows:  I will call Courtney at Dr. Juel Burrow office to review your medications and call you tomorrow to give further instructions.   Bring all of your meds, your BP cuff and your record of home blood pressures to your next appointment.  Exercise as you're able, try to walk approximately 30 minutes per day.  Keep salt intake to a minimum, especially watch canned and prepared boxed foods.  Eat more fresh fruits and vegetables and fewer canned items.  Avoid eating in fast food restaurants.    HOW TO TAKE YOUR BLOOD PRESSURE: . Rest 5 minutes before taking your blood pressure. .  Don't smoke or drink caffeinated beverages for at least 30 minutes before. . Take your blood pressure before (not after) you eat. . Sit comfortably with your back supported and both feet on the floor (don't cross your legs). . Elevate your arm to heart level on a table or a desk. . Use the proper sized cuff. It should fit smoothly and snugly around your bare upper arm. There should be enough room to slip a fingertip under the cuff. The bottom edge of the cuff should be 1 inch above the crease of the elbow. . Ideally, take 3 measurements at one sitting and record the average.

## 2019-06-08 NOTE — Assessment & Plan Note (Addendum)
Patient with uncontrolled systolic/diastolic hypertension, with 20 point difference between right and left arms.  She has a history of PAD, so I reviewed this with Dr. Gwenlyn Found.  Also called Dr Juel Burrow office and spoke with PA RN to get full picture.  Had long conversation with patient regarding hypertension and the fact that she will most likely need 2-3 medications for best control.  Will start her on chlorthalidone 25 mg daily to take with the losartan 50 mg.  Will need to check metabolic panel in 2 weeks.  She is to check home BP readings twice daily in both arms.  Will see her back in the office in 3 weeks for follow up.  If her BP readings continue to have a greater than 20 mmHg difference between right and left will evaluate for symptoms of claudication.  Per Dr. Gwenlyn Found if both of these, will order dopplers to check out blood flow.  In 3 weeks should her right arm readings continue to be elevated, would probably add carvedilol, to help with both BP and elevated heart rate readings.  Unsure as to why amlodipine was discontinued, or if patient just didn't refill at some time.  May consider adding back in at a later time.

## 2019-06-13 ENCOUNTER — Other Ambulatory Visit: Payer: Self-pay | Admitting: Cardiovascular Disease

## 2019-06-14 ENCOUNTER — Telehealth: Payer: Self-pay | Admitting: *Deleted

## 2019-06-14 DIAGNOSIS — M25552 Pain in left hip: Secondary | ICD-10-CM | POA: Diagnosis not present

## 2019-06-14 DIAGNOSIS — M25551 Pain in right hip: Secondary | ICD-10-CM | POA: Diagnosis not present

## 2019-06-14 NOTE — Telephone Encounter (Signed)
   [facility] Pre-operative Risk Assessment    Request for surgical clearance:  1. What type of surgery is being performed? RIGHT HIP MASS EXCISION    2. When is this surgery scheduled? TBD   3. What type of clearance is required (medical clearance vs. Pharmacy clearance to hold med vs. Both)? BOTH   4. Are there any medications that need to be held prior to surgery and how long? PLAVIX AND ASA    5. Practice name and name of physician performing surgery?  [facility] TIMOTHY MURPHY    6. What is your office phone number? [phone]     7.   What is your office fax number? [place]   Anesthesia type (None, local, MAC, general) ?

## 2019-06-14 NOTE — Telephone Encounter (Signed)
Rx has been sent to the pharmacy electronically. ° °

## 2019-06-15 DIAGNOSIS — M25551 Pain in right hip: Secondary | ICD-10-CM | POA: Diagnosis not present

## 2019-06-15 NOTE — Telephone Encounter (Signed)
   Primary Cardiologist: Quay Burow, MD  Chart reviewed as part of pre-operative protocol coverage. Patient was last seen by Dr. Gwenlyn Found on 05/04/2019 at which time she was doing well from a cardiac standpoint. I called patient today and she reports no changes since last visit. She denies any cardiac symptoms.   Given past medical history and time since last visit, based on ACC/AHA guidelines, [name] would be at acceptable risk for the planned procedure without further cardiovascular testing.   Per Dr. Gwenlyn Found, [place] to hold Aspirin and Plavix prior to procedure if needed. Both should be restarted as soon as able following procedure.   I will route this recommendation to the requesting party via Epic fax function and remove from pre-op pool.  Please call with questions.  Darreld Mclean, PA-C 06/15/2019, 10:46 AM

## 2019-06-15 NOTE — Telephone Encounter (Signed)
Okay to interrupt antiplatelet therapy for her surgical procedure.

## 2019-06-15 NOTE — Telephone Encounter (Signed)
Hi Dr. Gwenlyn Found,  [name] is scheduled to have a right hip mass excision (date TBD). Can you please comment on how long she can hold her Plavix and Aspirin? She has a history of PAD s/p direction atherectomy, PA, and stenting of left SFA in 10/2014 and left femoropopliteal bypass in 06/2016 by Dr. 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 You recently saw her on 05/04/2019 and she was doing well.   Please route response back to [facility] PREOP.  Thank you! Riyansh Gerstner

## 2019-06-16 DIAGNOSIS — J060 Acute laryngopharyngitis: Secondary | ICD-10-CM | POA: Diagnosis not present

## 2019-06-16 DIAGNOSIS — E1165 Type 2 diabetes mellitus with hyperglycemia: Secondary | ICD-10-CM | POA: Diagnosis not present

## 2019-06-16 DIAGNOSIS — T783XXA Angioneurotic edema, initial encounter: Secondary | ICD-10-CM | POA: Diagnosis not present

## 2019-06-16 DIAGNOSIS — M160 Bilateral primary osteoarthritis of hip: Secondary | ICD-10-CM | POA: Diagnosis not present

## 2019-06-16 DIAGNOSIS — I10 Essential (primary) hypertension: Secondary | ICD-10-CM | POA: Diagnosis not present

## 2019-06-16 DIAGNOSIS — E782 Mixed hyperlipidemia: Secondary | ICD-10-CM | POA: Diagnosis not present

## 2019-06-16 DIAGNOSIS — R140 Abdominal distension (gaseous): Secondary | ICD-10-CM | POA: Diagnosis not present

## 2019-06-16 DIAGNOSIS — E1140 Type 2 diabetes mellitus with diabetic neuropathy, unspecified: Secondary | ICD-10-CM | POA: Diagnosis not present

## 2019-06-16 DIAGNOSIS — R21 Rash and other nonspecific skin eruption: Secondary | ICD-10-CM | POA: Diagnosis not present

## 2019-06-16 DIAGNOSIS — E559 Vitamin D deficiency, unspecified: Secondary | ICD-10-CM | POA: Diagnosis not present

## 2019-06-16 DIAGNOSIS — L03313 Cellulitis of chest wall: Secondary | ICD-10-CM | POA: Diagnosis not present

## 2019-06-20 ENCOUNTER — Emergency Department (HOSPITAL_COMMUNITY): Payer: Medicare Other

## 2019-06-20 ENCOUNTER — Observation Stay (HOSPITAL_COMMUNITY): Payer: Medicare Other

## 2019-06-20 ENCOUNTER — Observation Stay (HOSPITAL_COMMUNITY)
Admission: EM | Admit: 2019-06-20 | Discharge: 2019-06-21 | Disposition: A | Discharge status: Home / Self Care | Payer: Medicare Other | Attending: Internal Medicine | Admitting: Internal Medicine

## 2019-06-20 ENCOUNTER — Other Ambulatory Visit: Payer: Self-pay

## 2019-06-20 ENCOUNTER — Encounter (HOSPITAL_COMMUNITY): Payer: Self-pay | Admitting: *Deleted

## 2019-06-20 DIAGNOSIS — E876 Hypokalemia: Secondary | ICD-10-CM | POA: Insufficient documentation

## 2019-06-20 DIAGNOSIS — M25562 Pain in left knee: Secondary | ICD-10-CM | POA: Diagnosis not present

## 2019-06-20 DIAGNOSIS — Z7902 Long term (current) use of antithrombotics/antiplatelets: Secondary | ICD-10-CM | POA: Diagnosis not present

## 2019-06-20 DIAGNOSIS — E1165 Type 2 diabetes mellitus with hyperglycemia: Secondary | ICD-10-CM | POA: Insufficient documentation

## 2019-06-20 DIAGNOSIS — Z7982 Long term (current) use of aspirin: Secondary | ICD-10-CM | POA: Insufficient documentation

## 2019-06-20 DIAGNOSIS — I2510 Atherosclerotic heart disease of native coronary artery without angina pectoris: Secondary | ICD-10-CM | POA: Insufficient documentation

## 2019-06-20 DIAGNOSIS — S199XXA Unspecified injury of neck, initial encounter: Secondary | ICD-10-CM | POA: Diagnosis not present

## 2019-06-20 DIAGNOSIS — I129 Hypertensive chronic kidney disease with stage 1 through stage 4 chronic kidney disease, or unspecified chronic kidney disease: Secondary | ICD-10-CM | POA: Insufficient documentation

## 2019-06-20 DIAGNOSIS — Z9861 Coronary angioplasty status: Secondary | ICD-10-CM | POA: Insufficient documentation

## 2019-06-20 DIAGNOSIS — E1122 Type 2 diabetes mellitus with diabetic chronic kidney disease: Secondary | ICD-10-CM | POA: Diagnosis not present

## 2019-06-20 DIAGNOSIS — I252 Old myocardial infarction: Secondary | ICD-10-CM | POA: Diagnosis not present

## 2019-06-20 DIAGNOSIS — N183 Chronic kidney disease, stage 3 unspecified: Secondary | ICD-10-CM | POA: Diagnosis not present

## 2019-06-20 DIAGNOSIS — S79911A Unspecified injury of right hip, initial encounter: Secondary | ICD-10-CM | POA: Diagnosis not present

## 2019-06-20 DIAGNOSIS — M25551 Pain in right hip: Secondary | ICD-10-CM | POA: Diagnosis not present

## 2019-06-20 DIAGNOSIS — S8992XA Unspecified injury of left lower leg, initial encounter: Secondary | ICD-10-CM | POA: Diagnosis not present

## 2019-06-20 DIAGNOSIS — F17210 Nicotine dependence, cigarettes, uncomplicated: Secondary | ICD-10-CM | POA: Insufficient documentation

## 2019-06-20 DIAGNOSIS — I69354 Hemiplegia and hemiparesis following cerebral infarction affecting left non-dominant side: Secondary | ICD-10-CM | POA: Diagnosis not present

## 2019-06-20 DIAGNOSIS — I700 Atherosclerosis of aorta: Secondary | ICD-10-CM | POA: Diagnosis not present

## 2019-06-20 DIAGNOSIS — R000 Tachycardia, unspecified: Secondary | ICD-10-CM

## 2019-06-20 DIAGNOSIS — S0990XA Unspecified injury of head, initial encounter: Secondary | ICD-10-CM | POA: Diagnosis not present

## 2019-06-20 DIAGNOSIS — Z20822 Contact with and (suspected) exposure to covid-19: Secondary | ICD-10-CM | POA: Insufficient documentation

## 2019-06-20 DIAGNOSIS — R7989 Other specified abnormal findings of blood chemistry: Secondary | ICD-10-CM | POA: Diagnosis not present

## 2019-06-20 DIAGNOSIS — Z79899 Other long term (current) drug therapy: Secondary | ICD-10-CM | POA: Insufficient documentation

## 2019-06-20 DIAGNOSIS — R55 Syncope and collapse: Principal | ICD-10-CM | POA: Diagnosis present

## 2019-06-20 DIAGNOSIS — W19XXXA Unspecified fall, initial encounter: Secondary | ICD-10-CM

## 2019-06-20 DIAGNOSIS — R42 Dizziness and giddiness: Secondary | ICD-10-CM | POA: Diagnosis not present

## 2019-06-20 DIAGNOSIS — M25552 Pain in left hip: Secondary | ICD-10-CM | POA: Diagnosis not present

## 2019-06-20 DIAGNOSIS — R739 Hyperglycemia, unspecified: Secondary | ICD-10-CM

## 2019-06-20 LAB — URINALYSIS, ROUTINE W REFLEX MICROSCOPIC
Bacteria, UA: NONE SEEN
Bilirubin Urine: NEGATIVE
Glucose, UA: >=500 mg/dL — AB
Ketones, ur: NEGATIVE mg/dL
Leukocytes,Ua: NEGATIVE
Nitrite: NEGATIVE
Protein, ur: 100 mg/dL — AB
Specific Gravity, Urine: 1.018 (ref 1.005–1.030)
pH: 6 (ref 5.0–8.0)

## 2019-06-20 LAB — TROPONIN I (HIGH SENSITIVITY)
Troponin I (High Sensitivity): 20 ng/L — ABNORMAL HIGH (ref <18)
Troponin I (High Sensitivity): 20 ng/L — ABNORMAL HIGH (ref <18)

## 2019-06-20 LAB — CBC WITH DIFFERENTIAL/PLATELET
Abs Immature Granulocytes: 0.03 10*3/uL (ref 0.00–0.07)
Basophils Absolute: 0.1 10*3/uL (ref 0.0–0.1)
Basophils Relative: 1 %
Eosinophils Absolute: 0 10*3/uL (ref 0.0–0.5)
Eosinophils Relative: 0 %
HCT: 50.6 % — ABNORMAL HIGH (ref 36.0–46.0)
Hemoglobin: 16.2 g/dL — ABNORMAL HIGH (ref 12.0–15.0)
Immature Granulocytes: 0 %
Lymphocytes Relative: 14 %
Lymphs Abs: 1.4 10*3/uL (ref 0.7–4.0)
MCH: 27.9 pg (ref 26.0–34.0)
MCHC: 32 g/dL (ref 30.0–36.0)
MCV: 87.1 fL (ref 80.0–100.0)
Monocytes Absolute: 1 10*3/uL (ref 0.1–1.0)
Monocytes Relative: 9 %
Neutro Abs: 7.8 10*3/uL — ABNORMAL HIGH (ref 1.7–7.7)
Neutrophils Relative %: 76 %
Platelets: 265 10*3/uL (ref 150–400)
RBC: 5.81 MIL/uL — ABNORMAL HIGH (ref 3.87–5.11)
RDW: 12.6 % (ref 11.5–15.5)
WBC: 10.3 10*3/uL (ref 4.0–10.5)
nRBC: 0 % (ref 0.0–0.2)

## 2019-06-20 LAB — CK: Total CK: 92 U/L (ref 38–234)

## 2019-06-20 LAB — COMPREHENSIVE METABOLIC PANEL
ALT: 33 U/L (ref 0–44)
AST: 29 U/L (ref 15–41)
Albumin: 4.6 g/dL (ref 3.5–5.0)
Alkaline Phosphatase: 110 U/L (ref 38–126)
Anion gap: 16 — ABNORMAL HIGH (ref 5–15)
BUN: 31 mg/dL — ABNORMAL HIGH (ref 8–23)
CO2: 29 mmol/L (ref 22–32)
Calcium: 9.9 mg/dL (ref 8.9–10.3)
Chloride: 86 mmol/L — ABNORMAL LOW (ref 98–111)
Creatinine, Ser: 1.61 mg/dL — ABNORMAL HIGH (ref 0.44–1.00)
GFR calc Af Amer: 39 mL/min — ABNORMAL LOW (ref >60)
GFR calc non Af Amer: 33 mL/min — ABNORMAL LOW (ref >60)
Glucose, Bld: 389 mg/dL — ABNORMAL HIGH (ref 70–99)
Potassium: 2.8 mmol/L — ABNORMAL LOW (ref 3.5–5.1)
Sodium: 131 mmol/L — ABNORMAL LOW (ref 135–145)
Total Bilirubin: 1.6 mg/dL — ABNORMAL HIGH (ref 0.3–1.2)
Total Protein: 8.5 g/dL — ABNORMAL HIGH (ref 6.5–8.1)

## 2019-06-20 LAB — LIPASE, BLOOD: Lipase: 98 U/L — ABNORMAL HIGH (ref 11–51)

## 2019-06-20 LAB — HEMOGLOBIN A1C
Hgb A1c MFr Bld: 8.6 % — ABNORMAL HIGH (ref 4.8–5.6)
Mean Plasma Glucose: 200.12 mg/dL

## 2019-06-20 LAB — RESPIRATORY PANEL BY RT PCR (FLU A&B, COVID)
Influenza A by PCR: NEGATIVE
Influenza B by PCR: NEGATIVE
SARS Coronavirus 2 by RT PCR: NEGATIVE

## 2019-06-20 LAB — GLUCOSE, CAPILLARY: Glucose-Capillary: 288 mg/dL — ABNORMAL HIGH (ref 70–99)

## 2019-06-20 LAB — D-DIMER, QUANTITATIVE: D-Dimer, Quant: 1.12 ug/mL-FEU — ABNORMAL HIGH (ref 0.00–0.50)

## 2019-06-20 LAB — HIV ANTIBODY (ROUTINE TESTING W REFLEX): HIV Screen 4th Generation wRfx: NONREACTIVE

## 2019-06-20 MED ORDER — INSULIN ASPART 100 UNIT/ML ~~LOC~~ SOLN
0.0000 [IU] | Freq: Three times a day (TID) | SUBCUTANEOUS | Status: DC
  Administered 2019-06-21: 5 [IU] via SUBCUTANEOUS
  Administered 2019-06-21: 11 [IU] via SUBCUTANEOUS

## 2019-06-20 MED ORDER — SODIUM CHLORIDE 0.9 % IV SOLN
INTRAVENOUS | Status: DC | PRN
  Administered 2019-06-20: 500 mL via INTRAVENOUS

## 2019-06-20 MED ORDER — POTASSIUM CHLORIDE CRYS ER 20 MEQ PO TBCR
40.0000 meq | EXTENDED_RELEASE_TABLET | Freq: Once | ORAL | Status: AC
  Administered 2019-06-20: 40 meq via ORAL
  Filled 2019-06-20: qty 2

## 2019-06-20 MED ORDER — IOHEXOL 350 MG/ML SOLN
80.0000 mL | Freq: Once | INTRAVENOUS | Status: AC | PRN
  Administered 2019-06-20: 19:00:00 80 mL via INTRAVENOUS

## 2019-06-20 MED ORDER — HEPARIN SODIUM (PORCINE) 5000 UNIT/ML IJ SOLN
5000.0000 [IU] | Freq: Three times a day (TID) | INTRAMUSCULAR | Status: DC
  Administered 2019-06-20 – 2019-06-21 (×3): 5000 [IU] via SUBCUTANEOUS
  Filled 2019-06-20 (×3): qty 1

## 2019-06-20 MED ORDER — ONDANSETRON HCL 4 MG/2ML IJ SOLN: 4.0000 mg | Freq: Four times a day (QID) | INTRAMUSCULAR | Status: DC | PRN

## 2019-06-20 MED ORDER — ONDANSETRON HCL 4 MG PO TABS
4.0000 mg | ORAL_TABLET | Freq: Four times a day (QID) | ORAL | Status: DC | PRN
  Administered 2019-06-21: 4 mg via ORAL
  Filled 2019-06-20: qty 1

## 2019-06-20 MED ORDER — ACETAMINOPHEN 325 MG PO TABS
650.0000 mg | ORAL_TABLET | Freq: Four times a day (QID) | ORAL | Status: DC | PRN
  Administered 2019-06-21: 650 mg via ORAL
  Filled 2019-06-20: qty 2

## 2019-06-20 MED ORDER — POTASSIUM CHLORIDE 10 MEQ/100ML IV SOLN
10.0000 meq | Freq: Once | INTRAVENOUS | Status: AC
  Administered 2019-06-20: 10 meq via INTRAVENOUS
  Filled 2019-06-20: qty 100

## 2019-06-20 MED ORDER — ACETAMINOPHEN 650 MG RE SUPP: 650.0000 mg | Freq: Four times a day (QID) | RECTAL | Status: DC | PRN

## 2019-06-20 MED ORDER — POTASSIUM CHLORIDE IN NACL 20-0.9 MEQ/L-% IV SOLN
INTRAVENOUS | Status: AC
  Filled 2019-06-20: qty 1000

## 2019-06-20 MED ORDER — INSULIN ASPART 100 UNIT/ML ~~LOC~~ SOLN
0.0000 [IU] | Freq: Every day | SUBCUTANEOUS | Status: DC
  Administered 2019-06-20: 3 [IU] via SUBCUTANEOUS

## 2019-06-20 MED ORDER — POTASSIUM CHLORIDE CRYS ER 20 MEQ PO TBCR
20.0000 meq | EXTENDED_RELEASE_TABLET | Freq: Once | ORAL | Status: DC
  Filled 2019-06-20: qty 1

## 2019-06-20 MED ORDER — LABETALOL HCL 5 MG/ML IV SOLN: 10.0000 mg | INTRAVENOUS | Status: DC | PRN

## 2019-06-20 NOTE — ED Provider Notes (Signed)
[facility] Provider Note   CSN: [id number] Arrival date & time: 06/20/19  1348     History Chief Complaint  Patient presents with   Loss of Consciousness    [name] is a 64 y.o. female.  HPI      [name] is a 64 y.o. female with past medical history of hypertension, diabetes, coronary artery disease, previous MI and previous stroke presents to the Emergency Department complaining of generalized weakness, dizziness and multiple falls.  She reports having an episode of "vertigo" that began on Wednesday.  She described having both a sensation of movement and room spinning that seem to improve on Thursday.  Yesterday, she reports having dizziness and weakness and fell 5 times.  One of the falls involved a fall in the bathroom in which she struck her head on the toilet.  She states she lost consciousness, but is unsure of duration as she lives at home alone.  She also reports having some nausea and loose stools that began yesterday as well.  She struck the right side of her head as she fell.  She is currently anticoagulated with Plavix and aspirin.  She denies vomiting, chest pain, abdominal pain, back pain and shortness of breath.  No history of fever or recent illness.   Past Medical History:  Diagnosis Date   Allergic urticaria 07/10/2015   Anemia    hx   Angioedema 07/10/2015   Anxiety    Arthritis    "neck, left hand" (09/14/2012)   Asthma    Cancer ([place]) 1985   ovarian, no treatment except surgery   Complication of anesthesia    [place] TO RIGHT   Critical lower limb ischemia    10/2014 s/p L SFA stenting   Diverticulosis of colon with hemorrhage April 2013   GERD (gastroesophageal reflux disease)    H/O hiatal hernia    Heart attack ([place])    2003 mild MI, March 2013 mild MI   Hidradenitis    groin   History of blood transfusion 1985 AND 2013   Hyperlipidemia    Hypertension    Irritable  bowel syndrome    Left-sided weakness    since stroke, left eye trouble seeing   Migraines    Mild CAD    a. Cath 09/2010: mild luminal irregularities of LAD, 30% prox RCA and 20-30% mRCA, EF 65%.   Neuropathy    Obesity    PAD (peripheral artery disease) (HCC)    a. critical limb ischemia s/p PTA/stenting of L SFA 10/2014. c. occ prior SFA stent by angio 01/2016, for possible PV bypass.   Pneumonia    baby   Recurrent upper respiratory infection (URI)    S/P arterial stent-mid Lt SFA 11/03/14 11/04/2014   Schatzki's ring    Sinus problem    Stomach ulcer 1972   non-bleeding   Stroke ([place]) 07-2007, 07-2008, 07-2009   total 3 strokes; mild left sided weakness and left eye "jumps".   Type II diabetes mellitus ([place])    Vitamin B 12 deficiency 07-15-2013    Patient Active Problem List   Diagnosis Date Noted   Tobacco abuse 05/04/2019   Diabetic gastroparesis associated with type 2 diabetes mellitus ([place]) 11/25/2017   Precordial chest pain 12/24/2016   Nausea & vomiting 12/24/2016   PAD (peripheral artery disease) ([place]) 06/19/2016   Diabetic neuropathy ([place]) 01/31/2016   Obesity 01/31/2016   Mild CAD    Claudication ([place]) 01/29/2016  Food allergy 08/02/2015   Recurrent urticaria 07/10/2015   Angioedema 07/10/2015   Perennial allergic rhinitis with a possible non-allergic component 07/10/2015   History of asthma 07/10/2015   S/P femoral-popliteal bypass surgery 11/04/2014   Critical lower limb ischemia 11/03/2014   Peripheral arterial disease ([place]) 11/02/2014   HNP (herniated nucleus pulposus) with myelopathy, cervical 04/11/2014   HNP (herniated nucleus pulposus), cervical 04/08/2014   Pancreatitis 01/02/2013   Abdominal pain, epigastric 12/14/2012   IBS (irritable bowel syndrome) 09/12/2011   GERD (gastroesophageal reflux disease) 09/12/2011   Diverticulosis of colon with hemorrhage 08/10/2011   Chest pain at rest 07/06/2011    Class:  Acute   Hidradenitis, left pubic area and left lower quadrant 01/18/2011   DM2 (diabetes mellitus, type 2) ([place]) 08/24/2007   Hyperlipidemia LDL goal <70 08/24/2007   Essential hypertension 08/24/2007    Past Surgical History:  Procedure Laterality Date   ABDOMINAL HYSTERECTOMY  1985   ADENOIDECTOMY     ANTERIOR CERVICAL DECOMP/DISCECTOMY FUSION  2002   ANTERIOR CERVICAL DECOMP/DISCECTOMY FUSION N/A 04/08/2014   Procedure: Cervical Six-Seven ANTERIOR CERVICAL DECOMPRESSION/DISCECTOMY FUSION Plating and Bonegraft  2 LEVELS;  Surgeon: Ashok Pall, MD;  Location: [facility];  Service: Neurosurgery;  Laterality: N/A;  Cervical Six-Seven ANTERIOR CERVICAL DECOMPRESSION/DISCECTOMY FUSION Plating and Bonegraft  2 LEVELS   APPENDECTOMY  1985   AXILLARY HIDRADENITIS EXCISION  1990-2008   bilateral   BACK SURGERY     BREAST BIOPSY Right 2007   BREAST CYST EXCISION Right 2008   BREAST REDUCTION SURGERY     CARDIAC CATHETERIZATION  2004   mild disease   CATARACT EXTRACTION W/PHACO Left 05/16/2015   Procedure: CATARACT EXTRACTION PHACO AND INTRAOCULAR LENS PLACEMENT (IOC);  Surgeon: Rutherford Guys, MD;  Location: [facility];  Service: Ophthalmology;  Laterality: Left;  CDE: 4.24   CATARACT EXTRACTION W/PHACO Right 05/30/2015   Procedure: CATARACT EXTRACTION RIGHT EYE PHACO AND INTRAOCULAR LENS PLACEMENT ;  Surgeon: Rutherford Guys, MD;  Location: [facility];  Service: Ophthalmology;  Laterality: Right;  CDE:4.08   CHOLECYSTECTOMY  1990's   COLONOSCOPY  08/12/2011   Procedure: COLONOSCOPY;  Surgeon: Ladene Artist, MD,FACG;  Location[facility];  Service: Endoscopy;  Laterality: N/A;   COLONOSCOPY  06/19/2006   cyst thigh Right    ESOPHAGOGASTRODUODENOSCOPY  08/12/2011   Procedure: ESOPHAGOGASTRODUODENOSCOPY (EGD);  Surgeon: Ladene Artist, MD,FACG;  Location: [facility] ENDOSCOPY;  Service: Endoscopy;  Laterality: N/A;   ESOPHAGOGASTRODUODENOSCOPY  06/04/2005   FEMORAL-POPLITEAL BYPASS GRAFT  Left 06/19/2016   Procedure: Left Leg BYPASS GRAFT FEMORAL-POPLITEAL ARTERY;  Surgeon: Serafina Mitchell, MD;  Location: [facility];  Service: Vascular;  Laterality: Left;   GIVENS CAPSULE STUDY  08/13/2011   Procedure: GIVENS CAPSULE STUDY;  Surgeon: Ladene Artist, MD,FACG;  Location: [facility] ENDOSCOPY;  Service: Endoscopy;  Laterality: N/A;   HAMMER TOE SURGERY Bilateral ~ 2000   HIATAL HERNIA REPAIR     HYDRADENITIS EXCISION  01/2011; 03/2012   'groin and abdomen; 03/2012" (09/14/2012)   HYDRADENITIS EXCISION  04/01/2012   Procedure: EXCISION HYDRADENITIS GROIN;  Surgeon: Pedro Earls, MD;  Location: [facility]S;  Service: General;  Laterality: Bilateral;  Excision of Hydradenitis of Perineum   HYDRADENITIS EXCISION N/A 09/17/2013   Procedure: EXCISION PERINEAL HIDRADENITIS ;  Surgeon: Pedro Earls, MD;  Location: [facility];  Service: General;  Laterality: N/A;  also in the pubis area   LEFT HEART CATH AND CORONARY ANGIOGRAPHY N/A 12/26/2016   Procedure: LEFT HEART CATH AND CORONARY  ANGIOGRAPHY;  Surgeon: [name], Peter M, MD;  Location: [facility] CV LAB;  Service: Cardiovascular;  Laterality: N/A;   MASS EXCISION Right 09/17/2013   Procedure: EXCISION MASS;  Surgeon: Pedro Earls, MD;  Location: [facility] ORS;  Service: General;  Laterality: Right;   NISSEN FUNDOPLICATION  [id number]   PERIPHERAL VASCULAR CATHETERIZATION N/A 11/03/2014   Procedure: Lower Extremity Angiography;  Surgeon: Lorretta Harp, MD;  Location: [facility] CV LAB;  Service: Cardiovascular;  Laterality: N/A;   PERIPHERAL VASCULAR CATHETERIZATION N/A 01/29/2016   Procedure: Lower Extremity Angiography;  Surgeon: Lorretta Harp, MD;  Location: [facility] CV LAB;  Service: Cardiovascular;  Laterality: N/A;   POSTERIOR LUMBAR FUSION  2008 X 2   REDUCTION MAMMAPLASTY  1996?   TONSILLECTOMY AND ADENOIDECTOMY  1959 AND 2000   UVULOPALATOPHARYNGOPLASTY, TONSILLECTOMY AND SEPTOPLASTY  2000's     OB History   No obstetric history  on file.     Family History  Problem Relation Age of Onset   Breast cancer Mother    Heart disease Mother    Hypertension Mother    Diabetes Father    Heart disease Father    Stroke Father    Heart disease Sister    Hypertension Sister    Hypertension Sister    Hyperlipidemia Sister    Diabetes Sister    Allergic rhinitis Sister    Emphysema Other        great uncle   Aneurysm Sister        brain   Angioedema Neg Hx    Asthma Neg Hx    Eczema Neg Hx    Immunodeficiency Neg Hx    Urticaria Neg Hx     Social History   Tobacco Use   Smoking status: Current Every Day Smoker    Packs/day: 0.50    Years: 41.00    Pack years: 20.50    Types: Cigarettes    Last attempt to quit: 01/15/2016    Years since quitting: 3.4   Smokeless tobacco: Never Used   Tobacco comment: Getting ready to start nicotine patches RX by Dr. Gwenlyn Found per pt.  Substance Use Topics   Alcohol use: No    Alcohol/week: 0.0 standard drinks   Drug use: No    Types: "Crack" cocaine    Comment: 05/09/2015.  "quit 07/27/1994"    Home Medications Prior to Admission medications   Medication Sig Start Date End Date Taking? Authorizing Provider  albuterol (PROAIR HFA) 108 (90 Base) MCG/ACT inhaler 1-2 inhalations every 4-6 hours as needed for cough or wheeze. Patient taking differently: Inhale 1-2 puffs into the lungs every 4 (four) hours as needed for shortness of breath.  07/10/15   Bobbitt, Sedalia Muta, MD  ALPRAZolam Duanne Moron) 0.5 MG tablet Take 0.25 mg by mouth daily as needed for anxiety or sleep.     [provider]  aspirin EC 81 MG tablet Take 81 mg by mouth daily.    [provider]  Azelastine HCl 0.15 % SOLN Place 2 sprays into both nostrils 2 (two) times daily. Patient taking differently: Place 2 sprays into both nostrils 2 (two) times daily as needed (allergies).  07/10/15   Bobbitt, Sed[place] Muta, MD  chlorthalidone (HYGROTON) 25 MG tablet Take 1 tablet (25 mg  total) by mouth daily. 06/08/19 09/06/19  Lorretta Harp, MD  citalopram (CELEXA) 10 MG tablet Take 10 mg by mouth daily.    [provider]  clopidogrel (PLAVIX) 75 MG tablet TAKE  1 TABLET BY MOUTH EVERY DAY WITH SUPPER - NEEDS OFFICE VISIT 06/14/19   Lorretta Harp, MD  cyclobenzaprine (FLEXERIL) 10 MG tablet Take 10 mg by mouth 3 (three) times daily as needed for muscle spasms.    [provider]  EPINEPHrine 0.3 mg/0.3 mL IJ SOAJ injection Inject 0.3 mLs (0.3 mg total) into the muscle once. 08/02/15   Bobbitt, Sedalia Muta, MD  Fenofibric Acid 105 MG TABS Take 1 tablet by mouth daily.    [provider]  HYDROcodone-acetaminophen [facility]) 7.5-325 MG tablet  03/09/19   [provider]  Insulin Disposable Pump (OMNIPOD DASH 5 PACK PODS) MISC  01/13/19   [provider]  insulin lispro (HUMALOG) 100 UNIT/ML injection Inject 5-12 Units into the skin 2 (two) times daily before a meal. Per sliding scale    [provider]  isosorbide mononitrate (IMDUR) 30 MG 24 hr tablet Take 1 tablet (30 mg total) by mouth daily. 01/07/17   Barrett, Evelene Croon, PA-C  loratadine (CLARITIN) 10 MG tablet Take 10 mg by mouth daily as needed for allergies.    [provider]  losartan (COZAAR) 50 MG tablet Take 1 tablet (50 mg total) by mouth daily. 05/04/19   Lorretta Harp, MD  mupirocin cream (BACTROBAN) 2 % Apply 1 application topically 2 (two) times daily as needed (wound care (boils)).     [provider]  mupirocin ointment (BACTROBAN) 2 % Apply 1 application topically 2 (two) times daily. 06/02/19   Wurst, [place], PA-C  naproxen (NAPROSYN) 375 MG tablet Take 1 tablet (375 mg total) by mouth 2 (two) times daily. 03/26/19   Wurst, [place], PA-C  nitroGLYCERIN (NITROSTAT) 0.4 MG SL tablet PLACE 1 TAB UNDER TONGUE EVERY 5 MINS AS NEEDED FOR CHEST PAIN - MAX 3 DOSES THEN 911 11/30/18   Barrett, Evelene Croon, PA-C  pantoprazole (PROTONIX) 40 MG tablet TAKE 1  TABLET BY MOUTH EVERY DAY 01/07/17   Lorretta Harp, MD  propranolol ER (INDERAL LA) 60 MG 24 hr capsule Take 1 capsule (60 mg total) by mouth every evening. 01/07/17   Barrett, Evelene Croon, PA-C  rosuvastatin (CRESTOR) 40 MG tablet Take 40 mg by mouth daily.    [provider]  SUMAtriptan (IMITREX) 50 MG tablet Take 50 mg by mouth every 2 (two) hours as needed for migraine.    [provider]  temazepam (RESTORIL) 30 MG capsule Take 30 mg by mouth at bedtime. 12/13/16   [provider]    Allergies    Sulfa antibiotics, Codeine, Fish allergy, Iodine, Metformin and related, Soliqua [insulin glargine-lixisenatide], Adhesive [tape], and Shellfish allergy  Review of Systems   Review of Systems  Constitutional: Negative for chills and fever.  Eyes: Negative for visual disturbance.  Respiratory: Negative for chest tightness and shortness of breath.   Cardiovascular: Negative for chest pain.  Gastrointestinal: Positive for diarrhea and nausea. Negative for abdominal pain and vomiting.  Genitourinary: Negative for difficulty urinating, dysuria and flank pain.  Musculoskeletal: Positive for arthralgias (left knee pain).  Skin: Negative for color change.  Neurological: Positive for dizziness, syncope, weakness and headaches. Negative for seizures, facial asymmetry, speech difficulty and numbness.  Psychiatric/Behavioral: Negative for confusion.    Physical Exam Updated Vital Signs BP (!) 144/95    Pulse (!) 113    Temp 97.9 F (36.6 C) (Oral)    Resp 18    Ht 5\' 4"  (1.626 m)    Wt 55.8 kg  SpO2 100%    BMI 21.11 kg/m   Physical Exam Vitals and nursing note reviewed.  Constitutional:      Appearance: Normal appearance.  HENT:     Head:     Comments: No palpable scalp hematomas    Mouth/Throat:     Mouth: Mucous membranes are moist.  Eyes:     Extraocular Movements: Extraocular movements intact.     Pupils: Pupils are equal, round, and reactive to light.    Cardiovascular:     Rate and Rhythm: Regular rhythm. Tachycardia present.     Pulses: Normal pulses.  Pulmonary:     Effort: Pulmonary effort is normal.     Breath sounds: Normal breath sounds.  Chest:     Chest wall: No tenderness.  Abdominal:     General: There is no distension.     Palpations: Abdomen is soft.     Tenderness: There is no abdominal tenderness.  Musculoskeletal:     Cervical back: Normal range of motion.     Right lower leg: No edema.     Left lower leg: No edema.  Skin:    General: Skin is warm.     Capillary Refill: Capillary refill takes less than 2 seconds.     Findings: No erythema or rash.  Neurological:     General: No focal deficit present.     Mental Status: She is alert and oriented to person, place, and time.     GCS: GCS eye subscore is 4. GCS verbal subscore is 5. GCS motor subscore is 6.     Sensory: Sensation is intact. No sensory deficit.     Motor: Motor function is intact. No weakness.     Coordination: Coordination is intact.     Comments: CN II-XII intact.  Speech clear, no pronator drift.  nml finger nose testing.  Grip strength symmetrical.      ED Results / Procedures / Treatments   Labs (all labs ordered are listed, but only abnormal results are displayed) Labs Reviewed  COMPREHENSIVE METABOLIC PANEL - Abnormal; Notable for the following components:      Result Value   Sodium 131 (*)    Potassium 2.8 (*)    Chloride 86 (*)    Glucose, Bld 389 (*)    BUN 31 (*)    Creatinine, Ser 1.61 (*)    Total Protein 8.5 (*)    Total Bilirubin 1.6 (*)    GFR calc non Af Amer 33 (*)    GFR calc Af Amer 39 (*)    Anion gap 16 (*)    All other components within normal limits  CBC WITH DIFFERENTIAL/PLATELET - Abnormal; Notable for the following components:   RBC 5.81 (*)    Hemoglobin 16.2 (*)    HCT 50.6 (*)    Neutro Abs 7.8 (*)    All other components within normal limits  LIPASE, BLOOD - Abnormal; Notable for the following  components:   Lipase 98 (*)    All other components within normal limits  TROPONIN I (HIGH SENSITIVITY) - Abnormal; Notable for the following components:   Troponin I (High Sensitivity) 20 (*)    All other components within normal limits  RESPIRATORY PANEL BY RT PCR (FLU A&B, COVID)  URINALYSIS, ROUTINE W REFLEX MICROSCOPIC  D-DIMER, QUANTITATIVE (NOT AT [facility])  TROPONIN I (HIGH SENSITIVITY)    EKG EKG Interpretation  Date/Time:  Sunday June 20 2019 15:03:00 EST Ventricular Rate:  119 PR Interval:  QRS Duration: 87 QT Interval:  351 QTC Calculation: 494 R Axis:   34 Text Interpretation: Sinus tachycardia Biatrial enlargement Repol abnrm suggests ischemia, inferior leads No STEMI. ST changes new from prior. Confirmed by Nanda Quinton [phone]) on 06/20/2019 3:15:10 PM   Radiology DG Chest 1 View  Result Date: 06/20/2019 CLINICAL DATA:  Status post fall with dizziness. EXAM: CHEST  1 VIEW COMPARISON:  March 26, 2019 FINDINGS: The heart size and mediastinal contours are within normal limits. Both lungs are clear. The visualized skeletal structures are unremarkable. IMPRESSION: No active disease. Electronically Signed   By: Abelardo Diesel M.D.   On: 06/20/2019 15:07   CT Head Wo Contrast  Result Date: 06/20/2019 CLINICAL DATA:  Poly trauma. Multiple falls. EXAM: CT HEAD WITHOUT CONTRAST CT CERVICAL SPINE WITHOUT CONTRAST TECHNIQUE: Multidetector CT imaging of the head and cervical spine was performed following the standard protocol without intravenous contrast. Multiplanar CT image reconstructions of the cervical spine were also generated. COMPARISON:  None. FINDINGS: CT HEAD FINDINGS Brain: Generalized parenchymal volume loss with commensurate dilatation of the ventricles and sulci. No mass, hemorrhage, edema or other evidence of acute parenchymal abnormality. No extra-axial hemorrhage. Vascular: Chronic calcified atherosclerotic changes of the large vessels at the skull base. No  unexpected hyperdense vessel. Skull: Normal. Negative for fracture or focal lesion. Sinuses/Orbits: No acute finding. Other: None. CT CERVICAL SPINE FINDINGS Alignment: Normal alignment. No evidence of acute vertebral body subluxation. Skull base and vertebrae: No fracture line or displaced fracture fragment seen. Facet joints are normally aligned throughout. Soft tissues and spinal canal: No prevertebral fluid or swelling. No visible canal hematoma. Disc levels: C2-3 through C4-5 disc spaces are well preserved. Surgical fusion at C5 through C7. No more than mild central canal stenosis at any level. Upper chest: Negative. Other: Bilateral carotid atherosclerosis. Anterior cervical fusion hardware appears intact and appropriately positioned at the C5 through C7 vertebral body levels. IMPRESSION: 1. No acute intracranial abnormality. No intracranial mass, hemorrhage or edema. No skull fracture. 2. No fracture or acute subluxation within the cervical spine. Surgical fusion hardware at C5 through C7 appears intact and appropriately positioned. 3. Carotid atherosclerosis. Electronically Signed   By: Franki Cabot M.D.   On: 06/20/2019 15:12   CT Cervical Spine Wo Contrast  Result Date: 06/20/2019 CLINICAL DATA:  Poly trauma. Multiple falls. EXAM: CT HEAD WITHOUT CONTRAST CT CERVICAL SPINE WITHOUT CONTRAST TECHNIQUE: Multidetector CT imaging of the head and cervical spine was performed following the standard protocol without intravenous contrast. Multiplanar CT image reconstructions of the cervical spine were also generated. COMPARISON:  None. FINDINGS: CT HEAD FINDINGS Brain: Generalized parenchymal volume loss with commensurate dilatation of the ventricles and sulci. No mass, hemorrhage, edema or other evidence of acute parenchymal abnormality. No extra-axial hemorrhage. Vascular: Chronic calcified atherosclerotic changes of the large vessels at the skull base. No unexpected hyperdense vessel. Skull: Normal.  Negative for fracture or focal lesion. Sinuses/Orbits: No acute finding. Other: None. CT CERVICAL SPINE FINDINGS Alignment: Normal alignment. No evidence of acute vertebral body subluxation. Skull base and vertebrae: No fracture line or displaced fracture fragment seen. Facet joints are normally aligned throughout. Soft tissues and spinal canal: No prevertebral fluid or swelling. No visible canal hematoma. Disc levels: C2-3 through C4-5 disc spaces are well preserved. Surgical fusion at C5 through C7. No more than mild central canal stenosis at any level. Upper chest: Negative. Other: Bilateral carotid atherosclerosis. Anterior cervical fusion hardware appears intact and appropriately positioned at the C5 through  C7 vertebral body levels. IMPRESSION: 1. No acute intracranial abnormality. No intracranial mass, hemorrhage or edema. No skull fracture. 2. No fracture or acute subluxation within the cervical spine. Surgical fusion hardware at C5 through C7 appears intact and appropriately positioned. 3. Carotid atherosclerosis. Electronically Signed   By: Franki Cabot M.D.   On: 06/20/2019 15:12   DG Knee Complete 4 Views Left  Result Date: 06/20/2019 CLINICAL DATA:  Status post fall with left knee pain. EXAM: LEFT KNEE - COMPLETE 4+ VIEW COMPARISON:  None. FINDINGS: No evidence of fracture, dislocation, or joint effusion. Bipartite patella is noted. Soft tissues are unremarkable. IMPRESSION: No acute fracture or dislocation. Electronically Signed   By: Abelardo Diesel M.D.   On: 06/20/2019 15:05   DG Hips Bilat W or Wo Pelvis 3-4 Views  Result Date: 06/20/2019 CLINICAL DATA:  Status post fall with hip pain. EXAM: DG HIP (WITH OR WITHOUT PELVIS) 3-4V BILAT COMPARISON:  None. FINDINGS: There is no evidence of hip fracture or dislocation. Calcifications are identified over the soft tissues of right hip. IMPRESSION: No acute fracture or dislocation. Electronically Signed   By: Abelardo Diesel M.D.   On: 06/20/2019  15:07    Procedures Procedures (including critical care time)  Medications Ordered in ED Medications  potassium chloride 10 mEq in 100 mL IVPB (has no administration in time range)  potassium chloride SA (KLOR-CON) CR tablet 40 mEq (has no administration in time range)    ED Course  I have reviewed the triage vital signs and the nursing notes.  Pertinent labs & imaging results that were available during my care of the patient were reviewed by me and considered in my medical decision making (see chart for details).  Clinical Course as of Jun 19 1704  Sun Jun 20, 2019  1704 Potassium(!): 2.8 [TT]  1704 Comprehensive metabolic panel(!) [TT]    Clinical Course User Index [TT] Kem Parkinson, PA-C   MDM Rules/Calculators/A&P                      Pt with complaints of dizziness and weakness with report of 5 falls yesterday one of which included a head injury with syncope.  CT head and C spine are reassuring.  She does have a significant cardiac hx and some ischemic changes on EKG.  She is also hypokalemic at 2.8 with previous K+ of 4 one year ago.    Pt also seen by Dr. Laverta Baltimore and care plan discussed.  She will likely need admission for cardiac rule out.  Pt signed out to Evalee Jefferson, PA-C with delta trop and D dimer still pending.     Final Clinical Impression(s) / ED Diagnoses Final diagnoses:  Falls    Rx / DC Orders ED Discharge Orders    None       Kem Parkinson, PA-C 06/20/19 1707    LongWonda Olds, MD 06/23/19 2102

## 2019-06-20 NOTE — H&P (Addendum)
History and Physical    [name] [id number] DOB: 01-02-1956 DOA: 06/20/2019  PCP: [name], MD   Patient coming from: Home  Chief Complaint: Syncope  HPI: [name] is a 64 y.o. female with medical history significant for hypertension, diabetes, CAD with prior MI, prior history of CVA with residual left-sided weakness, dyslipidemia, anxiety, CKD stage IIIb, and peripheral vascular disease with prior stenting who typically ambulates with a walker.  She has had prior episodes of vertigo and states that she had significant episodes that started this past Wednesday and she reports that she fell 5 times yesterday and had one episode where she lost consciousness.  She has some bruising to her legs associated with this and also had some mild nausea and diarrhea.  She denies any chest pain or shortness of breath and did manage to hit the back of her head when she fell yesterday.  She was not sure how long she was on the floor as she lives at home by herself.  She is currently anticoagulated with Plavix and aspirin.  No other recent illness noted.   ED Course: Vital signs are stable aside from sinus tachycardia noted on telemetry as well as EKG.  She has had CT of her head and neck with no acute findings.  No other fractures identified to her lower extremities.  Her sodium is 131 and potassium is noted to be 2.8 which is being repleted.  Creatinine is 1.6 which is likely near her baseline given CKD stage IIIb.  Her glucose is elevated at 389 and she states that she has not eaten much nor has she taken her home insulin today.  She currently denies any other symptoms and is hungry.  Troponins have been stable with flat trend.  Her D-dimer is elevated at 1.12 and therefore CT angiogram of the chest will be obtained with low-dose contrast.  Her Covid testing is negative.  Review of Systems: All others reviewed as noted above and otherwise negative.  Past Medical History:  Diagnosis Date  .  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 Anemia    hx  . Angioedema 07/10/2015  . Anxiety   . Arthritis    "neck, left hand" (09/14/2012)  . Asthma   . Cancer ([place]) 1985   ovarian, no treatment except surgery  . Complication of anesthesia    OCCASIONAL TROUBLE TURNING NECK TO RIGHT  . Critical lower limb ischemia    10/2014 s/p L SFA stenting  . Diverticulosis of colon with hemorrhage April 2013  . GERD (gastroesophageal reflux disease)   . H/O hiatal hernia   . Heart attack [facility])    2003 mild MI, March 2013 mild MI  . Hidradenitis    groin  . History of blood transfusion 1985 AND 2013  . Hyperlipidemia   . Hypertension   . Irritable bowel syndrome   . Left-sided weakness    since stroke, left eye trouble seeing  . Migraines   . Mild CAD    a. Cath 09/2010: mild luminal irregularities of LAD, 30% prox RCA and 20-30% mRCA, EF 65%.  . Neuropathy   . Obesity   . PAD (peripheral artery disease) ([place])    a. critical limb ischemia s/p PTA/stenting of L SFA 10/2014. c. occ prior SFA stent by angio 01/2016, for possible PV bypass.  . Pneumonia    baby  . Recurrent upper respiratory infection (URI)   . S/P arterial stent-mid Lt SFA 11/03/14 11/04/2014  . Schatzki's  ring   . Sinus problem   . Stomach ulcer 1972   non-bleeding  . Stroke ([place]) 07-2007, 07-2008, 07-2009   total 3 strokes; mild left sided weakness and left eye "jumps".  . Type II diabetes mellitus ([place])   . Vitamin B 12 deficiency 07-15-2013    Past Surgical History:  Procedure Laterality Date  . ABDOMINAL HYSTERECTOMY  1985  . ADENOIDECTOMY    . ANTERIOR CERVICAL DECOMP/DISCECTOMY FUSION  2002  . ANTERIOR CERVICAL DECOMP/DISCECTOMY FUSION N/A 04/08/2014   Procedure: Cervical Six-Seven ANTERIOR CERVICAL DECOMPRESSION/DISCECTOMY FUSION Plating and Bonegraft  2 LEVELS;  Surgeon: Ashok Pall, MD;  Location: [place] NEURO ORS;  Service: Neurosurgery;  Laterality: N/A;  Cervical Six-Seven ANTERIOR CERVICAL DECOMPRESSION/DISCECTOMY FUSION  Plating and Bonegraft  2 LEVELS  . APPENDECTOMY  1985  . AXILLARY HIDRADENITIS EXCISION  1990-2008   bilateral  . BACK SURGERY    . BREAST BIOPSY Right 2007  . BREAST CYST EXCISION Right 2008  . BREAST REDUCTION SURGERY    . CARDIAC CATHETERIZATION  2004   mild disease  . CATARACT EXTRACTION W/PHACO Left 05/16/2015   Procedure: CATARACT EXTRACTION PHACO AND INTRAOCULAR LENS PLACEMENT (IOC);  Surgeon: Rutherford Guys, MD;  Location: [facility];  Service: Ophthalmology;  Laterality: Left;  CDE: 4.24  . CATARACT EXTRACTION W/PHACO Right 05/30/2015   Procedure: CATARACT EXTRACTION RIGHT EYE PHACO AND INTRAOCULAR LENS PLACEMENT ;  Surgeon: Rutherford Guys, MD;  Location: [facility];  Service: Ophthalmology;  Laterality: Right;  CDE:4.08  . CHOLECYSTECTOMY  1990's  . COLONOSCOPY  08/12/2011   Procedure: COLONOSCOPY;  Surgeon: Ladene Artist, MD,FACG;  Location: [facility] ENDOSCOPY;  Service: Endoscopy;  Laterality: N/A;  . COLONOSCOPY  06/19/2006  . cyst thigh Right   . ESOPHAGOGASTRODUODENOSCOPY  08/12/2011   Procedure: ESOPHAGOGASTRODUODENOSCOPY (EGD);  Surgeon: Ladene Artist, MD,FACG;  Location: [facility] ENDOSCOPY;  Service: Endoscopy;  Laterality: N/A;  . ESOPHAGOGASTRODUODENOSCOPY  06/04/2005  . FEMORAL-POPLITEAL BYPASS GRAFT Left 06/19/2016   Procedure: Left Leg BYPASS GRAFT FEMORAL-POPLITEAL ARTERY;  Surgeon: Serafina Mitchell, MD;  Location: [place];  Service: Vascular;  Laterality: Left;  . GIVENS CAPSULE STUDY  08/13/2011   Procedure: GIVENS CAPSULE STUDY;  Surgeon: Ladene Artist, MD,FACG;  Location: [facility] ENDOSCOPY;  Service: Endoscopy;  Laterality: N/A;  . HAMMER TOE SURGERY Bilateral ~ 2000  . HIATAL HERNIA REPAIR    . HYDRADENITIS EXCISION  01/2011; 03/2012   'groin and abdomen; 03/2012" (09/14/2012)  . HYDRADENITIS EXCISION  04/01/2012   Procedure: EXCISION HYDRADENITIS GROIN;  Surgeon: Pedro Earls, MD;  Location: [facility] ORS;  Service: General;  Laterality: Bilateral;  Excision of Hydradenitis of Perineum  .  HYDRADENITIS EXCISION N/A 09/17/2013   Procedure: EXCISION PERINEAL HIDRADENITIS ;  Surgeon: Pedro Earls, MD;  Location: [facility] ORS;  Service: General;  Laterality: N/A;  also in the pubis area  . LEFT HEART CATH AND CORONARY ANGIOGRAPHY N/A 12/26/2016   Procedure: LEFT HEART CATH AND CORONARY ANGIOGRAPHY;  Surgeon: [name], Peter M, MD;  Location: [place] CV LAB;  Service: Cardiovascular;  Laterality: N/A;  . MASS EXCISION Right 09/17/2013   Procedure: EXCISION MASS;  Surgeon: Pedro Earls, MD;  Location: [facility] ORS;  Service: General;  Laterality: Right;  . NISSEN FUNDOPLICATION  99991111  . PERIPHERAL VASCULAR CATHETERIZATION N/A 11/03/2014   Procedure: Lower Extremity Angiography;  Surgeon: Lorretta Harp, MD;  Location: [place] CV LAB;  Service: Cardiovascular;  Laterality: N/A;  . PERIPHERAL VASCULAR CATHETERIZATION N/A 01/29/2016   Procedure: Lower  Extremity Angiography;  Surgeon: Lorretta Harp, MD;  Location: [place] CV LAB;  Service: Cardiovascular;  Laterality: N/A;  . POSTERIOR LUMBAR FUSION  2008 X 2  . REDUCTION MAMMAPLASTY  1996?  . TONSILLECTOMY AND ADENOIDECTOMY  1959 AND 2000  . UVULOPALATOPHARYNGOPLASTY, TONSILLECTOMY AND SEPTOPLASTY  2000's     reports that she has been smoking cigarettes. She has a 20.50 pack-year smoking history. She has never used smokeless tobacco. She reports that she does not drink alcohol or use drugs.  Allergies  Allergen Reactions  . Sulfa Antibiotics Shortness Of Breath and Palpitations  . 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 Fish Allergy Hives and Swelling    Tongue swelling  . Iodine Swelling  . Metformin And Related Diarrhea  . [name] [Insulin Glargine-Lixisenatide] Itching and Other (See Comments)    "tongue swelling"  . Adhesive [Tape] Rash    Paper tape is ok  . Shellfish Allergy Swelling and Rash    Tongue swelling    Family History  Problem Relation Age of Onset  .  Breast cancer Mother   . Heart disease Mother   . Hypertension Mother   . Diabetes Father   . Heart disease Father   . Stroke Father   . Heart disease Sister   . Hypertension Sister   . Hypertension Sister   . Hyperlipidemia Sister   . Diabetes Sister   . Allergic rhinitis Sister   . Emphysema Other        great uncle  . Aneurysm Sister        brain  . Angioedema Neg Hx   . Asthma Neg Hx   . Eczema Neg Hx   . Immunodeficiency Neg Hx   . Urticaria Neg Hx     Prior to Admission medications   Medication Sig Start Date End Date Taking? Authorizing Provider  albuterol (PROAIR HFA) 108 (90 Base) MCG/ACT inhaler 1-2 inhalations every 4-6 hours as needed for cough or wheeze. Patient taking differently: Inhale 1-2 puffs into the lungs every 4 (four) hours as needed for shortness of breath.  07/10/15   Bobbitt, Sedalia Muta, MD  ALPRAZolam Duanne Moron) 0.5 MG tablet Take 0.25 mg by mouth daily as needed for anxiety or sleep.     [provider]  aspirin EC 81 MG tablet Take 81 mg by mouth daily.    [provider]  Azelastine HCl 0.15 % SOLN Place 2 sprays into both nostrils 2 (two) times daily. Patient taking differently: Place 2 sprays into both nostrils 2 (two) times daily as needed (allergies).  07/10/15   Bobbitt, Sedalia Muta, MD  chlorthalidone (HYGROTON) 25 MG tablet Take 1 tablet (25 mg total) by mouth daily. 06/08/19 09/06/19  Lorretta Harp, MD  citalopram (CELEXA) 10 MG tablet Take 10 mg by mouth daily.    [provider]  clopidogrel (PLAVIX) 75 MG tablet TAKE 1 TABLET BY MOUTH EVERY DAY WITH SUPPER - NEEDS OFFICE VISIT 06/14/19   Lorretta Harp, MD  cyclobenzaprine (FLEXERIL) 10 MG tablet Take 10 mg by mouth 3 (three) times daily as needed for muscle spasms.    [provider]  EPINEPHrine 0.3 mg/0.3 mL IJ SOAJ injection Inject 0.3 mLs (0.3 mg total) into the muscle once. 08/02/15   Bobbitt, Sedalia Muta, MD  Fenofibric Acid 105 MG TABS Take 1 tablet  by mouth daily.    [provider]  HYDROcodone-acetaminophen (NORCO) 7.5-325 MG  tablet  03/09/19   [provider]  Insulin Disposable Pump (OMNIPOD DASH 5 PACK PODS) MISC  01/13/19   [provider]  insulin lispro (HUMALOG) 100 UNIT/ML injection Inject 5-12 Units into the skin 2 (two) times daily before a meal. Per sliding scale    [provider]  isosorbide mononitrate (IMDUR) 30 MG 24 hr tablet Take 1 tablet (30 mg total) by mouth daily. 01/07/17   Barrett, Evelene Croon, PA-C  loratadine (CLARITIN) 10 MG tablet Take 10 mg by mouth daily as needed for allergies.    [provider]  losartan (COZAAR) 50 MG tablet Take 1 tablet (50 mg total) by mouth daily. 05/04/19   Lorretta Harp, MD  mupirocin cream (BACTROBAN) 2 % Apply 1 application topically 2 (two) times daily as needed (wound care (boils)).     [provider]  mupirocin ointment (BACTROBAN) 2 % Apply 1 application topically 2 (two) times daily. 06/02/19   Wurst, [place], PA-C  naproxen (NAPROSYN) 375 MG tablet Take 1 tablet (375 mg total) by mouth 2 (two) times daily. 03/26/19   Wurst, [place], PA-C  nitroGLYCERIN (NITROSTAT) 0.4 MG SL tablet PLACE 1 TAB UNDER TONGUE EVERY 5 MINS AS NEEDED FOR CHEST PAIN - MAX 3 DOSES THEN 911 11/30/18   Barrett, Evelene Croon, PA-C  pantoprazole (PROTONIX) 40 MG tablet TAKE 1 TABLET BY MOUTH EVERY DAY 01/07/17   Lorretta Harp, MD  propranolol ER (INDERAL LA) 60 MG 24 hr capsule Take 1 capsule (60 mg total) by mouth every evening. 01/07/17   Barrett, Evelene Croon, PA-C  rosuvastatin (CRESTOR) 40 MG tablet Take 40 mg by mouth daily.    [provider]  SUMAtriptan (IMITREX) 50 MG tablet Take 50 mg by mouth every 2 (two) hours as needed for migraine.    [provider]  temazepam (RESTORIL) 30 MG capsule Take 30 mg by mouth at bedtime. 12/13/16   [provider]    Physical Exam: Vitals:   06/20/19 1453 06/20/19 1524 06/20/19 1530  06/20/19 1600  BP:  133/89 105/71 (!) 144/95  Pulse:      Resp: 18 14 18 18   Temp:      TempSrc:      SpO2:      Weight:      Height:        Constitutional: NAD, calm, comfortable Vitals:   06/20/19 1453 06/20/19 1524 06/20/19 1530 06/20/19 1600  BP:  133/89 105/71 (!) 144/95  Pulse:      Resp: 18 14 18 18   Temp:      TempSrc:      SpO2:      Weight:      Height:       Eyes: lids and conjunctivae normal ENMT: Mucous membranes are moist.  Neck: normal, supple Respiratory: clear to auscultation bilaterally. Normal respiratory effort. No accessory muscle use.  Cardiovascular: Regular rate and rhythm, no murmurs. No extremity edema. Abdomen: no tenderness, no distention. Bowel sounds positive.  Musculoskeletal:  No joint deformity upper and lower extremities.   Skin: no rashes, lesions, ulcers.  Psychiatric: Normal judgment and insight. Alert and oriented x 3. Normal mood.   Labs on Admission: I have personally reviewed following labs and imaging studies  CBC: Recent Labs  Lab 06/20/19 1515  WBC 10.3  NEUTROABS 7.8*  HGB 16.2*  HCT 50.6*  MCV 87.1  PLT 99991111   Basic Metabolic Panel: Recent Labs  Lab 06/20/19 1515  NA 131*  K 2.8*  CL 86*  CO2 29  GLUCOSE 389*  BUN 31*  CREATININE 1.61*  CALCIUM 9.9   GFR: Estimated Creatinine Clearance: 30.5 mL/min (A) (by C-G formula based on SCr of 1.61 mg/dL (H)). Liver Function Tests: Recent Labs  Lab 06/20/19 1515  AST 29  ALT 33  ALKPHOS 110  BILITOT 1.6*  PROT 8.5*  ALBUMIN 4.6   Recent Labs  Lab 06/20/19 1515  LIPASE 98*   No results for input(s): AMMONIA in the last 168 hours. Coagulation Profile: No results for input(s): INR, PROTIME in the last 168 hours. Cardiac Enzymes: No results for input(s): CKTOTAL, CKMB, CKMBINDEX, TROPONINI in the last 168 hours. BNP (last 3 results) No results for input(s): PROBNP in the last 8760 hours. HbA1C: No results for input(s): HGBA1C in the last 72  hours. CBG: No results for input(s): GLUCAP in the last 168 hours. Lipid Profile: No results for input(s): CHOL, HDL, LDLCALC, TRIG, CHOLHDL, LDLDIRECT in the last 72 hours. Thyroid Function Tests: No results for input(s): TSH, T4TOTAL, FREET4, T3FREE, THYROIDAB in the last 72 hours. Anemia Panel: No results for input(s): VITAMINB12, FOLATE, FERRITIN, TIBC, IRON, RETICCTPCT in the last 72 hours. Urine analysis:    Component Value Date/Time   COLORURINE YELLOW 06/20/2019 1657   APPEARANCEUR HAZY (A) 06/20/2019 1657   LABSPEC 1.018 06/20/2019 1657   PHURINE 6.0 06/20/2019 1657   GLUCOSEU >=500 (A) 06/20/2019 1657   HGBUR SMALL (A) 06/20/2019 1657   BILIRUBINUR NEGATIVE 06/20/2019 1657   KETONESUR NEGATIVE 06/20/2019 1657   PROTEINUR 100 (A) 06/20/2019 1657   UROBILINOGEN 1.0 12/05/2009 1908   NITRITE NEGATIVE 06/20/2019 1657   LEUKOCYTESUR NEGATIVE 06/20/2019 1657    Radiological Exams on Admission: DG Chest 1 View  Result Date: 06/20/2019 CLINICAL DATA:  Status post fall with dizziness. EXAM: CHEST  1 VIEW COMPARISON:  March 26, 2019 FINDINGS: The heart size and mediastinal contours are within normal limits. Both lungs are clear. The visualized skeletal structures are unremarkable. IMPRESSION: No active disease. Electronically Signed   By: Abelardo Diesel M.D.   On: 06/20/2019 15:07   CT Head Wo Contrast  Result Date: 06/20/2019 CLINICAL DATA:  Poly trauma. Multiple falls. EXAM: CT HEAD WITHOUT CONTRAST CT CERVICAL SPINE WITHOUT CONTRAST TECHNIQUE: Multidetector CT imaging of the head and cervical spine was performed following the standard protocol without intravenous contrast. Multiplanar CT image reconstructions of the cervical spine were also generated. COMPARISON:  None. FINDINGS: CT HEAD FINDINGS Brain: Generalized parenchymal volume loss with commensurate dilatation of the ventricles and sulci. No mass, hemorrhage, edema or other evidence of acute parenchymal abnormality. No  extra-axial hemorrhage. Vascular: Chronic calcified atherosclerotic changes of the large vessels at the skull base. No unexpected hyperdense vessel. Skull: Normal. Negative for fracture or focal lesion. Sinuses/Orbits: No acute finding. Other: None. CT CERVICAL SPINE FINDINGS Alignment: Normal alignment. No evidence of acute vertebral body subluxation. Skull base and vertebrae: No fracture line or displaced fracture fragment seen. Facet joints are normally aligned throughout. Soft tissues and spinal canal: No prevertebral fluid or swelling. No visible canal hematoma. Disc levels: C2-3 through C4-5 disc spaces are well preserved. Surgical fusion at C5 through C7. No more than mild central canal stenosis at any level. Upper chest: Negative. Other: Bilateral carotid atherosclerosis. Anterior cervical fusion hardware appears intact and appropriately positioned at the C5 through C7 vertebral body levels. IMPRESSION: 1. No acute intracranial abnormality. No intracranial mass, hemorrhage or edema. No skull fracture. 2. No fracture or acute  subluxation within the cervical spine. Surgical fusion hardware at C5 through C7 appears intact and appropriately positioned. 3. Carotid atherosclerosis. Electronically Signed   By: Franki Cabot M.D.   On: 06/20/2019 15:12   CT Cervical Spine Wo Contrast  Result Date: 06/20/2019 CLINICAL DATA:  Poly trauma. Multiple falls. EXAM: CT HEAD WITHOUT CONTRAST CT CERVICAL SPINE WITHOUT CONTRAST TECHNIQUE: Multidetector CT imaging of the head and cervical spine was performed following the standard protocol without intravenous contrast. Multiplanar CT image reconstructions of the cervical spine were also generated. COMPARISON:  None. FINDINGS: CT HEAD FINDINGS Brain: Generalized parenchymal volume loss with commensurate dilatation of the ventricles and sulci. No mass, hemorrhage, edema or other evidence of acute parenchymal abnormality. No extra-axial hemorrhage. Vascular: Chronic calcified  atherosclerotic changes of the large vessels at the skull base. No unexpected hyperdense vessel. Skull: Normal. Negative for fracture or focal lesion. Sinuses/Orbits: No acute finding. Other: None. CT CERVICAL SPINE FINDINGS Alignment: Normal alignment. No evidence of acute vertebral body subluxation. Skull base and vertebrae: No fracture line or displaced fracture fragment seen. Facet joints are normally aligned throughout. Soft tissues and spinal canal: No prevertebral fluid or swelling. No visible canal hematoma. Disc levels: C2-3 through C4-5 disc spaces are well preserved. Surgical fusion at C5 through C7. No more than mild central canal stenosis at any level. Upper chest: Negative. Other: Bilateral carotid atherosclerosis. Anterior cervical fusion hardware appears intact and appropriately positioned at the C5 through C7 vertebral body levels. IMPRESSION: 1. No acute intracranial abnormality. No intracranial mass, hemorrhage or edema. No skull fracture. 2. No fracture or acute subluxation within the cervical spine. Surgical fusion hardware at C5 through C7 appears intact and appropriately positioned. 3. Carotid atherosclerosis. Electronically Signed   By: Franki Cabot M.D.   On: 06/20/2019 15:12   DG Knee Complete 4 Views Left  Result Date: 06/20/2019 CLINICAL DATA:  Status post fall with left knee pain. EXAM: LEFT KNEE - COMPLETE 4+ VIEW COMPARISON:  None. FINDINGS: No evidence of fracture, dislocation, or joint effusion. Bipartite patella is noted. Soft tissues are unremarkable. IMPRESSION: No acute fracture or dislocation. Electronically Signed   By: Abelardo Diesel M.D.   On: 06/20/2019 15:05   DG Hips Bilat W or Wo Pelvis 3-4 Views  Result Date: 06/20/2019 CLINICAL DATA:  Status post fall with hip pain. EXAM: DG HIP (WITH OR WITHOUT PELVIS) 3-4V BILAT COMPARISON:  None. FINDINGS: There is no evidence of hip fracture or dislocation. Calcifications are identified over the soft tissues of right hip.  IMPRESSION: No acute fracture or dislocation. Electronically Signed   By: Abelardo Diesel M.D.   On: 06/20/2019 15:07    EKG: Independently reviewed.  Sinus tachycardia at 119 bpm with some ST segment changes  Assessment/Plan Active Problems:   Syncope and collapse    Syncope and collapse -Questionable if this is cardiogenic, but also could be related to polypharmacy as she has multiple sedating agents that she typically uses -Fall precautions and PT evaluation -Carotid ultrasound bilateral as well as 2D echocardiogram -Monitor on telemetry -We will likely need Holter monitor on discharge  Sinus tachycardia with elevated D-dimer -Check CT angiogram for possible PE -Continue telemetry monitoring -Maintain on propranolol  Hypokalemia -Possibly secondary to her home chlorthalidone -Replete and recheck in a.m. along with magnesium  Hyperglycemia in the setting of type 2 diabetes -SSI with carb modified diet -Continue mealtime insulin  Mild hyponatremia -Likely secondary to chlorthalidone use at home -Maintain on gentle normal saline with potassium  supplementation and monitor repeat labs  CKD stage IIIb -Questionable baseline creatinine but she does not appear to have AKI -Gently hydrate and monitor and avoid home losartan and nephrotoxic agents for now -Repeat renal panel in a.m.  History of hypertension -Continue home medications and monitor carefully -Hold chlorthalidone for now as well as losartan -IV labetalol as needed for significant elevations  Prior history of CAD/MI -Continue aspirin/Plavix/beta-blocker -Hold statin for now and check CK level  History of anxiety -Continue on Celexa and Xanax and other home meds  GERD -Continue PPI  DVT prophylaxis: Heparin subcu Code Status: Full Family Communication: None at bedside, patient lives alone Disposition Plan: Admit for syncope evaluation and telemetry monitoring Consults called: None Admission status:  Observation, telemetry   Shakeera Rightmyer D Aristides Luckey DO [facility]  If 7PM-7AM, please contact night-coverage [email or web address]  06/20/2019, 6:33 PM

## 2019-06-20 NOTE — ED Provider Notes (Signed)
Pt signed out from [facility], pending labs including delta troponin and d dimer.  Multiple episodes of falls with vertigo, head injury, negative imaging.  H/o cva and MI,  Hyperglycemia, hypokalemia. Persistent tachycardia.  She does endorse episodes of tachycardia with the vertigo sx, mild sob with episodes.  Denies chest pain.  Delta troponins negative today but with ischemic changes on ekg, will benefit from admission for cardiac rule out.  Also, however, she does have an elevated d dimer at 1.12. on plavix and aspirin. No leg pain or swelling.  Renal insufficiency (GFR 39).  Question need for CTA chest vs VQ scan.   Discussed with Dr. Manuella Ghazi who accepts pt for admission.     Evalee Jefferson, PA-C 06/20/19 1811    Veryl Speak, MD 06/20/19 2238

## 2019-06-20 NOTE — ED Notes (Signed)
ED Provider at bedside. 

## 2019-06-20 NOTE — ED Triage Notes (Signed)
Pt c/o dizziness that started Wednesday. Pt reports yesterday she fell 5 times with 1 time losing consciousness. Pt reports bruising to her legs due to all the falls yesterday. Pt also reports nausea and diarrhea that started yesterday. Denies CP, SOB. Pt reports she did hit the back of her head when she fell yesterday and is currently taking Plavix.

## 2019-06-21 ENCOUNTER — Other Ambulatory Visit: Payer: Self-pay

## 2019-06-21 ENCOUNTER — Observation Stay (HOSPITAL_BASED_OUTPATIENT_CLINIC_OR_DEPARTMENT_OTHER): Payer: Medicare Other

## 2019-06-21 ENCOUNTER — Observation Stay (HOSPITAL_COMMUNITY): Payer: Medicare Other

## 2019-06-21 DIAGNOSIS — R55 Syncope and collapse: Secondary | ICD-10-CM | POA: Diagnosis not present

## 2019-06-21 DIAGNOSIS — I499 Cardiac arrhythmia, unspecified: Secondary | ICD-10-CM

## 2019-06-21 DIAGNOSIS — R002 Palpitations: Secondary | ICD-10-CM | POA: Diagnosis not present

## 2019-06-21 DIAGNOSIS — I6523 Occlusion and stenosis of bilateral carotid arteries: Secondary | ICD-10-CM | POA: Diagnosis not present

## 2019-06-21 LAB — GLUCOSE, CAPILLARY
Glucose-Capillary: 221 mg/dL — ABNORMAL HIGH (ref 70–99)
Glucose-Capillary: 310 mg/dL — ABNORMAL HIGH (ref 70–99)

## 2019-06-21 LAB — CBC
HCT: 43.2 % (ref 36.0–46.0)
Hemoglobin: 14 g/dL (ref 12.0–15.0)
MCH: 28.2 pg (ref 26.0–34.0)
MCHC: 32.4 g/dL (ref 30.0–36.0)
MCV: 86.9 fL (ref 80.0–100.0)
Platelets: 225 10*3/uL (ref 150–400)
RBC: 4.97 MIL/uL (ref 3.87–5.11)
RDW: 12.5 % (ref 11.5–15.5)
WBC: 8.5 10*3/uL (ref 4.0–10.5)
nRBC: 0 % (ref 0.0–0.2)

## 2019-06-21 LAB — BASIC METABOLIC PANEL
Anion gap: 11 (ref 5–15)
BUN: 33 mg/dL — ABNORMAL HIGH (ref 8–23)
CO2: 29 mmol/L (ref 22–32)
Calcium: 9.4 mg/dL (ref 8.9–10.3)
Chloride: 93 mmol/L — ABNORMAL LOW (ref 98–111)
Creatinine, Ser: 1.32 mg/dL — ABNORMAL HIGH (ref 0.44–1.00)
GFR calc Af Amer: 49 mL/min — ABNORMAL LOW (ref >60)
GFR calc non Af Amer: 43 mL/min — ABNORMAL LOW (ref >60)
Glucose, Bld: 207 mg/dL — ABNORMAL HIGH (ref 70–99)
Potassium: 3.2 mmol/L — ABNORMAL LOW (ref 3.5–5.1)
Sodium: 133 mmol/L — ABNORMAL LOW (ref 135–145)

## 2019-06-21 LAB — ECHOCARDIOGRAM COMPLETE
Height: 64 in
Weight: 2148.16 oz

## 2019-06-21 LAB — MAGNESIUM: Magnesium: 2.4 mg/dL (ref 1.7–2.4)

## 2019-06-21 MED ORDER — MELATONIN 3 MG PO TABS
6.0000 mg | ORAL_TABLET | Freq: Every evening | ORAL | Status: DC | PRN
  Administered 2019-06-21: 6 mg via ORAL
  Filled 2019-06-21: qty 2

## 2019-06-21 MED ORDER — POTASSIUM CHLORIDE CRYS ER 20 MEQ PO TBCR
40.0000 meq | EXTENDED_RELEASE_TABLET | Freq: Once | ORAL | Status: AC
  Administered 2019-06-21: 40 meq via ORAL
  Filled 2019-06-21: qty 2

## 2019-06-21 MED ORDER — INSULIN GLARGINE 100 UNIT/ML ~~LOC~~ SOLN: 20.0000 [IU] | Freq: Every day | SUBCUTANEOUS | Status: DC

## 2019-06-21 MED ORDER — POTASSIUM CHLORIDE ER 10 MEQ PO TBCR: 10.0000 meq | EXTENDED_RELEASE_TABLET | Freq: Two times a day (BID) | ORAL | 3 refills | Status: DC

## 2019-06-21 MED ORDER — ALUM & MAG HYDROXIDE-SIMETH 200-200-20 MG/5ML PO SUSP
30.0000 mL | Freq: Once | ORAL | Status: AC
  Administered 2019-06-21: 30 mL via ORAL
  Filled 2019-06-21: qty 30

## 2019-06-21 MED ORDER — POTASSIUM CHLORIDE IN NACL 20-0.9 MEQ/L-% IV SOLN: INTRAVENOUS | Status: DC

## 2019-06-21 NOTE — Evaluation (Signed)
Physical Therapy Evaluation Patient Details Name: [name] MRN: [id number] DOB: Sep 22, 1955 Today's Date: 06/21/2019   History of Present Illness  [name] is a 64 y.o. female with medical history significant for hypertension, diabetes, CAD with prior MI, prior history of CVA with residual left-sided weakness, dyslipidemia, anxiety, CKD stage IIIb, and peripheral vascular disease with prior stenting who typically ambulates with a walker.  She has had prior episodes of vertigo and states that she had significant episodes that started this past Wednesday and she reports that she fell 5 times yesterday and had one episode where she lost consciousness.  She has some bruising to her legs associated with this and also had some mild nausea and diarrhea.  She denies any chest pain or shortness of breath and did manage to hit the back of her head when she fell yesterday.  She was not sure how long she was on the floor as she lives at home by herself.  She is currently anticoagulated with Plavix and aspirin.  No other recent illness noted    Clinical Impression  Patient has to lean on nearby objects for support during transfers or taking steps without AD, required use of RW for safety demonstrating slightly labored slow cadence without loss of balance, limited for ambulation due to c/o fatigue and tolerated sitting up in chair after therapy.  Patient will benefit from continued physical therapy in hospital and recommended venue below to increase strength, balance, endurance for safe ADLs and gait.    Follow Up Recommendations Home health PT;Supervision - Intermittent    Equipment Recommendations  None recommended by PT    Recommendations for Other Services       Precautions / Restrictions Precautions Precautions: Fall Restrictions Weight Bearing Restrictions: No      Mobility  Bed Mobility Overal bed mobility: Modified Independent             General bed mobility comments:  increased time, slightly labored movement  Transfers Overall transfer level: Needs assistance Equipment used: None;Rolling walker (2 wheeled) Transfers: Sit to/from [employer] Transfers Sit to Stand: Supervision;Min guard Stand pivot transfers: Supervision;Min guard       General transfer comment: has to lean on nearby objects for support when not using an AD, safer using RW  Ambulation/Gait Ambulation/Gait assistance: Supervision;Min guard Gait Distance (Feet): 70 Feet Assistive device: Rolling walker (2 wheeled) Gait Pattern/deviations: Decreased step length - right;Decreased step length - left;Decreased stride length Gait velocity: decreased   General Gait Details: has to lean on nearby objects for support, required use of RW for safety, demonstrates slow slightly labored cadence without loss of balance, limited secondary to c/o fatigue and mild dizziness  Stairs            Wheelchair Mobility    Modified Rankin (Stroke Patients Only)       Balance Overall balance assessment: Needs assistance Sitting-balance support: Feet supported;No upper extremity supported Sitting balance-Leahy Scale: Good Sitting balance - Comments: seated at EOB   Standing balance support: During functional activity;No upper extremity supported Standing balance-Leahy Scale: Poor Standing balance comment: fair/poor without AD, fair/good using RW                             Pertinent Vitals/Pain Pain Assessment: Faces Faces Pain Scale: Hurts little more Pain Location: headache Pain Descriptors / Indicators: Aching Pain Intervention(s): Limited activity within patient's tolerance;Monitored during session    Home Living  Family/patient expects to be discharged to:: Private residence Living Arrangements: Alone Available Help at Discharge: Family;Friend(s);Available PRN/intermittently Type of Home: Apartment Home Access: Level entry     Home Layout: One level Home  Equipment: Kasandra Knudsen - quad;Walker - 2 wheels;Bedside commode;Shower seat      Prior Function Level of Independence: Independent         Comments: Hydrographic surveyor, drives, using RW recently due to falls/vertigo     Hand Dominance   Dominant Hand: Right    Extremity/Trunk Assessment   Upper Extremity Assessment Upper Extremity Assessment: Generalized weakness    Lower Extremity Assessment Lower Extremity Assessment: Generalized weakness    Cervical / Trunk Assessment Cervical / Trunk Assessment: Normal  Communication   Communication: No difficulties  Cognition Arousal/Alertness: Awake/alert Behavior During Therapy: WFL for tasks assessed/performed Overall Cognitive Status: Within Functional Limits for tasks assessed                                        General Comments      Exercises     Assessment/Plan    PT Assessment Patient needs continued PT services  PT Problem List Decreased strength;Decreased activity tolerance;Decreased balance;Decreased mobility       PT Treatment Interventions Balance training;Gait training;Stair training;Functional mobility training;Therapeutic activities;Patient/family education    PT Goals (Current goals can be found in the Care Plan section)  Acute Rehab PT Goals Patient Stated Goal: return home with family/friends to assist PT Goal Formulation: With patient Time For Goal Achievement: 06/23/19 Potential to Achieve Goals: Good    Frequency Min 3X/week   Barriers to discharge        Co-evaluation               AM-PAC PT "6 Clicks" Mobility  Outcome Measure Help needed turning from your back to your side while in a flat bed without using bedrails?: None Help needed moving from lying on your back to sitting on the side of a flat bed without using bedrails?: A Little Help needed moving to and from a bed to a chair (including a wheelchair)?: A Little Help needed standing up from a chair using your  arms (e.g., wheelchair or bedside chair)?: A Little Help needed to walk in hospital room?: A Little Help needed climbing 3-5 steps with a railing? : A Little 6 Click Score: 19    End of Session   Activity Tolerance: Patient tolerated treatment well;Patient limited by fatigue Patient left: in chair;with call bell/phone within reach Nurse Communication: Mobility status PT Visit Diagnosis: Unsteadiness on feet (R26.81);Other abnormalities of gait and mobility (R26.89);Muscle weakness (generalized) (M62.81)    Time: [id number] PT Time Calculation (min) (ACUTE ONLY): 28 min   Charges:   PT Evaluation $PT Eval Moderate Complexity: 1 Mod PT Treatments $Therapeutic Activity: 23-37 mins        9:16 AM, 06/21/19 Lonell Grandchild, MPT Physical Therapist with [facility] [phone] office [phone] mobile phone

## 2019-06-21 NOTE — Plan of Care (Signed)

## 2019-06-21 NOTE — Plan of Care (Signed)
  Problem: Acute Rehab PT Goals(only PT should resolve) Goal: Pt Will Go Supine/Side To Sit Outcome: Progressing Flowsheets (Taken 06/21/2019 0919) Pt will go Supine/Side to Sit: Independently Goal: Patient Will Transfer Sit To/From Stand Outcome: Progressing Flowsheets (Taken 06/21/2019 0919) Patient will transfer sit to/from stand:  with supervision  with modified independence Goal: Pt Will Transfer Bed To Chair/Chair To Bed Outcome: Progressing Flowsheets (Taken 06/21/2019 0919) Pt will Transfer Bed to Chair/Chair to Bed:  with modified independence  with supervision Goal: Pt Will Ambulate Outcome: Progressing Flowsheets (Taken 06/21/2019 0919) Pt will Ambulate:  100 feet  with modified independence  with supervision  with rolling walker   9:19 AM, 06/21/19 Lonell Grandchild, MPT Physical Therapist with [facility] 336 [phone] office [phone] mobile phone

## 2019-06-21 NOTE — Progress Notes (Signed)
Nsg Discharge Note  Admit Date:  06/20/2019 Discharge date: 06/21/2019   [name] to be D/C'd home per MD order.  AVS completed.  Copy for chart, and copy for patient signed, and dated. Patient taken to [facility] for holter monitor placement per MD verbal order.  Patient/caregiver able to verbalize understanding.  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   Fish Allergy Hives, Swelling   Tongue swelling   Iodine Swelling   Metformin And Related Diarrhea   Soliqua [insulin Glargine-lixisenatide] Itching, Other (See Comments)   "tongue swelling"   Adhesive [tape] Rash   Paper tape is ok   Shellfish Allergy Swelling, Rash   Tongue swelling      Medication List    STOP taking these medications   ALPRAZolam 0.5 MG tablet Commonly known as: XANAX   cyclobenzaprine 10 MG tablet Commonly known as: FLEXERIL   temazepam 30 MG capsule Commonly known as: RESTORIL     TAKE these medications   albuterol 108 (90 Base) MCG/ACT inhaler Commonly known as: ProAir HFA 1-2 inhalations every 4-6 hours as needed for cough or wheeze. What changed:   how much to take  how to take this  when to take this  reasons to take this  additional instructions   aspirin EC 81 MG tablet Take 81 mg by mouth daily.   Azelastine HCl 0.15 % Soln Place 2 sprays into both nostrils 2 (two) times daily. What changed:   when to take this  reasons to take this   CeleXA 10 MG tablet Generic drug: citalopram Take 10 mg by mouth daily.   chlorthalidone 25 MG tablet Commonly known as: HYGROTON Take 1 tablet (25 mg total) by mouth daily.   cloNIDine 0.1 MG tablet Commonly known as: CATAPRES Take 0.1 mg by mouth at bedtime.   clopidogrel 75 MG tablet Commonly known as: PLAVIX TAKE 1 TABLET BY MOUTH EVERY DAY WITH SUPPER - NEEDS OFFICE  VISIT What changed: See the new instructions.   EPINEPHrine 0.3 mg/0.3 mL Soaj injection Commonly known as: EPI-PEN Inject 0.3 mLs (0.3 mg total) into the muscle once.   HYDROcodone-acetaminophen 7.5-325 MG tablet Commonly known as: NORCO Take 1 tablet by mouth daily as needed for moderate pain.   insulin lispro 100 UNIT/ML injection Commonly known as: HUMALOG Inject 5-12 Units into the skin 2 (two) times daily before a meal. Per sliding scale using OMNIPOD   isosorbide mononitrate 30 MG 24 hr tablet Commonly known as: Imdur Take 1 tablet (30 mg total) by mouth daily.   losartan 50 MG tablet Commonly known as: COZAAR Take 1 tablet (50 mg total) by mouth daily.   mupirocin ointment 2 % Commonly known as: BACTROBAN Apply 1 application topically 2 (two) times daily.   naproxen 375 MG tablet Commonly known as: NAPROSYN Take 1 tablet (375 mg total) by mouth 2 (two) times daily.   nitroGLYCERIN 0.4 MG SL tablet Commonly known as: NITROSTAT PLACE 1 TAB UNDER TONGUE EVERY 5 MINS AS NEEDED FOR CHEST PAIN - MAX 3 DOSES THEN 911 What changed:   how much to take  how to take this  when to take this  reasons to take this  additional instructions   pantoprazole 40 MG tablet Commonly known as: PROTONIX TAKE 1 TABLET BY MOUTH EVERY DAY   potassium  chloride 10 MEQ tablet Commonly known as: KLOR-CON Take 1 tablet (10 mEq total) by mouth 2 (two) times daily.   propranolol ER 60 MG 24 hr capsule Commonly known as: INDERAL LA Take 1 capsule (60 mg total) by mouth every evening.   rosuvastatin 40 MG tablet Commonly known as: CRESTOR Take 40 mg by mouth every morning.   SUMAtriptan 50 MG tablet Commonly known as: IMITREX Take 50 mg by mouth every 2 (two) hours as needed for migraine.       Discharge Assessment: Vitals:   06/21/19 0504 06/21/19 1327  BP: 129/88 (!) 166/80  Pulse: 94 85  Resp: 18 17  Temp: 98.5 F (36.9 C) 98.2 F (36.8 C)  SpO2: 99% 98%   Skin  clean, dry and intact without evidence of skin break down, no evidence of skin tears noted. IV catheter discontinued intact. Site without signs and symptoms of complications - no redness or edema noted at insertion site, patient denies c/o pain - only slight tenderness at site.  Dressing with slight pressure applied.  D/c Instructions-Education: Discharge instructions given to patient/family with verbalized understanding. D/c education completed with patient/family including follow up instructions, medication list, d/c activities limitations if indicated, with other d/c instructions as indicated by MD - patient able to verbalize understanding, all questions fully answered. Patient instructed to return to ED, call 911, or call MD for any changes in condition.  Patient escorted via [place], and D/C home via private auto.  Venita Sheffield, RN 06/21/2019 4:14 PM

## 2019-06-21 NOTE — Progress Notes (Signed)
Inpatient Diabetes Program Recommendations  AACE/ADA: New Consensus Statement on Inpatient Glycemic Control   Target Ranges:  Prepandial:   less than 140 mg/dL      Peak postprandial:   less than 180 mg/dL (1-2 hours)      Critically ill patients:  140 - 180 mg/dL   Results for [name], [name] (MRN [id number]) as of 06/21/2019 11:18  Ref. Range 06/20/2019 20:49 06/21/2019 07:33 06/21/2019 11:04  Glucose-Capillary Latest Ref Range: 70 - 99 mg/dL 288 (H) 221 (H) 310 (H)   Results for [name], [name] (MRN [id number]) as of 06/21/2019 11:18  Ref. Range 06/20/2019 15:15  Hemoglobin A1C Latest Ref Range: 4.8 - 5.6 % 8.6 (H)   Review of Glycemic Control  Diabetes history: DM2 Outpatient Diabetes medications: OnmiPod with Humalog Current orders for Inpatient glycemic control: Novolog 0-15 units TID with meals, Novolog 0-5 units QHS  Inpatient Diabetes Program Recommendations:   Insulin-Basal: Please consider ordering Lantus 12 units daily to start now (based on 60.9 kg x 0.2 units).  Insulin-Meal Coverage: Please consider ordering Novolog 4 units TID with meals for meal coverage if patient eats at least 50% of meals.  A1C: A1C 8.6% on 06/20/19 indicating an average glucose of 200 mg/dl over the past 2-3 months.  NOTE: Spoke with patient over the phone about diabetes and home regimen for diabetes control. Patient reports being followed by PCP for diabetes management and she uses an OmniPod with Humalog insulin as an outpatient for diabetes control. Patient states that she works with Pablo Lawrence, PA at Dr. Juel Burrow office with pump management.  Patient is not sure of her insulin pump settings and states that she removed her insulin pump at home prior to coming to the hospital. Patient states that she takes 4, 6, or 8 units of insulin with meals (via pump) if glucose is over 100 mg/dl.  Patient reports checking glucose at home and reports that it has been in the 150-170's mg/dl.  Patient reports that  her A1C is usually between 7-8%.  Discussed A1C 8.6% on 06/20/19 indicating an average glucose of 200 mg/dl. Discussed glucose and A1C goals. Discussed importance of checking CBGs and maintaining good CBG control to prevent long-term and short-term complications. Informed patient that I would reach out to Dr. Juel Burrow office to see if they have insulin pump settings documented.  Discussed that currently she is ordered Novolog and it would be requested to add basal insulin while inpatient.  Patient verbalized understanding of information discussed and reports no further questions at this time related to diabetes. Called Dr. Juel Burrow office and they do not have patient's pump settings documented. They report they did have a representative with OmniPod to come to the office but since COVID the representative has not been coming.   Thanks, Barnie Alderman, RN, MSN, CDE Diabetes Coordinator Inpatient Diabetes Program [phone] (Team Pager)

## 2019-06-21 NOTE — Progress Notes (Signed)
  Echocardiogram 2D Echocardiogram has been performed.  

## 2019-06-21 NOTE — Clinical Social Work Note (Signed)
Discussed PT recommendation for HHPT with patient. Patient is agreeable to HHPT. Discussed agencies accepting [facility] currently. Referral made to [facility] with [facility] for [place].    Ersel Wadleigh, Clydene Pugh, LCSW

## 2019-06-21 NOTE — Discharge Summary (Signed)
Physician Discharge Summary  [name] [id number] DOB: 1956/03/15 DOA: 06/20/2019  PCP: [name], MD  Admit date: 06/20/2019  Discharge date: 06/21/2019  Admitted From:Home  Disposition:  Home  Recommendations for Outpatient Follow-up:  1. Follow up with PCP in 1-2 weeks 2. Continue on Holter monitor for 30 days and follow-up with cardiologist Dr. Gwenlyn Found in 4 weeks 3. Refrain use of home muscle relaxer if possible 4. Continue on potassium twice daily and recheck BMP in 1 week with PCP  Home Health: Yes with PT  Equipment/Devices: Has home walker  Discharge Condition: Stable  CODE STATUS: Full  Diet recommendation: Heart Healthy/carb modified  Brief/Interim Summary: Per HPI: [name] is a 64 y.o. female with medical history significant for hypertension, diabetes, CAD with prior MI, prior history of CVA with residual left-sided weakness, dyslipidemia, anxiety, CKD stage IIIa, and peripheral vascular disease with prior stenting who typically ambulates with a walker.  She has had prior episodes of vertigo and states that she had significant episodes that started this past Wednesday and she reports that she fell 5 times yesterday and had one episode where she lost consciousness.  She has some bruising to her legs associated with this and also had some mild nausea and diarrhea.  She denies any chest pain or shortness of breath and did manage to hit the back of her head when she fell yesterday.  She was not sure how long she was on the floor as she lives at home by herself.  She is currently anticoagulated with Plavix and aspirin.  No other recent illness noted.  2/15: Patient was admitted with syncope and collapse suspected to be cardiogenic in origin.  No findings have been noted on telemetry monitoring while hospitalized and 2D echocardiogram demonstrates LVEF 65-70% with grade 1 diastolic dysfunction and no significant valvular abnormalities.  CT angiogram without any  findings of pulmonary embolism noted.  Carotid ultrasound with minimal to moderate plaque noted bilaterally as noted in the results below with no significant hemodynamic stenosis.  She has ambulated with PT using her home walker and will be set up with home health PT.  She will also be set up with home Holter monitor prior to discharge with close follow-up to her cardiologist.  She was noted to have some hypokalemia while here likely related to her chlorthalidone use for which she will be on potassium supplementation and will need close monitoring in the outpatient setting.  No other acute events noted throughout the inpatient stay.  She is stable for discharge.  Discharge Diagnoses:  Active Problems:   Syncope and collapse  Principal discharge diagnosis: Syncope and collapse with questionable cardiogenic origin versus polypharmacy.  CKD stage IIIa with hypokalemia.  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   Fish Allergy Hives, Swelling   Tongue swelling   Iodine Swelling   Metformin And Related Diarrhea   Soliqua [insulin Glargine-lixisenatide] Itching, Other (See Comments)   "tongue swelling"   Adhesive [tape] Rash   Paper tape is ok   Shellfish Allergy Swelling, Rash   Tongue swelling      Medication  List    STOP taking these medications   ALPRAZolam 0.5 MG tablet Commonly known as: XANAX   cyclobenzaprine 10 MG tablet Commonly known as: FLEXERIL   temazepam 30 MG capsule Commonly known as: RESTORIL     TAKE these medications   albuterol 108 (90 Base) MCG/ACT inhaler Commonly known as: ProAir HFA 1-2 inhalations every 4-6 hours as needed for cough or  wheeze. What changed:   how much to take  how to take this  when to take this  reasons to take this  additional instructions   aspirin EC 81 MG tablet Take 81 mg by mouth daily.   Azelastine HCl 0.15 % Soln Place 2 sprays into both nostrils 2 (two) times daily. What changed:   when to take this  reasons to take this   CeleXA 10 MG tablet Generic drug: citalopram Take 10 mg by mouth daily.   chlorthalidone 25 MG tablet Commonly known as: HYGROTON Take 1 tablet (25 mg total) by mouth daily.   cloNIDine 0.1 MG tablet Commonly known as: CATAPRES Take 0.1 mg by mouth at bedtime.   clopidogrel 75 MG tablet Commonly known as: PLAVIX TAKE 1 TABLET BY MOUTH EVERY DAY WITH SUPPER - NEEDS OFFICE VISIT What changed: See the new instructions.   EPINEPHrine 0.3 mg/0.3 mL Soaj injection Commonly known as: EPI-PEN Inject 0.3 mLs (0.3 mg total) into the muscle once.   HYDROcodone-acetaminophen 7.5-325 MG tablet Commonly known as: NORCO Take 1 tablet by mouth daily as needed for moderate pain.   insulin lispro 100 UNIT/ML injection Commonly known as: HUMALOG Inject 5-12 Units into the skin 2 (two) times daily before a meal. Per sliding scale using OMNIPOD   isosorbide mononitrate 30 MG 24 hr tablet Commonly known as: Imdur Take 1 tablet (30 mg total) by mouth daily.   losartan 50 MG tablet Commonly known as: COZAAR Take 1 tablet (50 mg total) by mouth daily.   mupirocin ointment 2 % Commonly known as: BACTROBAN Apply 1 application topically 2 (two) times daily.   naproxen 375 MG tablet Commonly known as: NAPROSYN Take 1 tablet (375 mg total) by mouth 2 (two) times daily.   nitroGLYCERIN 0.4 MG SL tablet Commonly known as: NITROSTAT PLACE 1 TAB UNDER TONGUE EVERY 5 MINS AS NEEDED FOR CHEST PAIN - MAX 3 DOSES THEN 911 What changed:   how much to take  how to take this  when to take this  reasons to take this  additional instructions   pantoprazole 40 MG  tablet Commonly known as: PROTONIX TAKE 1 TABLET BY MOUTH EVERY DAY   potassium chloride 10 MEQ tablet Commonly known as: KLOR-CON Take 1 tablet (10 mEq total) by mouth 2 (two) times daily.   propranolol ER 60 MG 24 hr capsule Commonly known as: INDERAL LA Take 1 capsule (60 mg total) by mouth every evening.   rosuvastatin 40 MG tablet Commonly known as: CRESTOR Take 40 mg by mouth every morning.   SUMAtriptan 50 MG tablet Commonly known as: IMITREX Take 50 mg by mouth every 2 (two) hours as needed for migraine.      Follow-up Information    [name], MD Follow up in 1 week(s).   Specialty: Internal Medicine Contact information: [facility] [address] [phone]        Lorretta Harp, MD Follow up in 4 week(s).   Specialties: Cardiology, Radiology Contact information: [address] [place] [address] [phone]  Allergies  Allergen Reactions  . Sulfa Antibiotics Shortness Of Breath and Palpitations  . 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 Fish Allergy Hives and Swelling    Tongue swelling  . Iodine Swelling  . Metformin And Related Diarrhea  . [name] [Insulin Glargine-Lixisenatide] Itching and Other (See Comments)    "tongue swelling"  . Adhesive [Tape] Rash    Paper tape is ok  . Shellfish Allergy Swelling and Rash    Tongue swelling    Consultations:  None   Procedures/Studies: DG Chest 1 View  Result Date: 06/20/2019 CLINICAL DATA:  Status post fall with dizziness. EXAM: CHEST  1 VIEW COMPARISON:  March 26, 2019 FINDINGS: The heart size and mediastinal contours are within normal limits. Both lungs are clear. The visualized skeletal structures are unremarkable. IMPRESSION: No active disease. Electronically Signed   By: Abelardo Diesel M.D.   On: 06/20/2019 15:07   CT Head Wo Contrast  Result Date: 06/20/2019 CLINICAL DATA:  Poly trauma.  Multiple falls. EXAM: CT HEAD WITHOUT CONTRAST CT CERVICAL SPINE WITHOUT CONTRAST TECHNIQUE: Multidetector CT imaging of the head and cervical spine was performed following the standard protocol without intravenous contrast. Multiplanar CT image reconstructions of the cervical spine were also generated. COMPARISON:  None. FINDINGS: CT HEAD FINDINGS Brain: Generalized parenchymal volume loss with commensurate dilatation of the ventricles and sulci. No mass, hemorrhage, edema or other evidence of acute parenchymal abnormality. No extra-axial hemorrhage. Vascular: Chronic calcified atherosclerotic changes of the large vessels at the skull base. No unexpected hyperdense vessel. Skull: Normal. Negative for fracture or focal lesion. Sinuses/Orbits: No acute finding. Other: None. CT CERVICAL SPINE FINDINGS Alignment: Normal alignment. No evidence of acute vertebral body subluxation. Skull base and vertebrae: No fracture line or displaced fracture fragment seen. Facet joints are normally aligned throughout. Soft tissues and spinal canal: No prevertebral fluid or swelling. No visible canal hematoma. Disc levels: C2-3 through C4-5 disc spaces are well preserved. Surgical fusion at C5 through C7. No more than mild central canal stenosis at any level. Upper chest: Negative. Other: Bilateral carotid atherosclerosis. Anterior cervical fusion hardware appears intact and appropriately positioned at the C5 through C7 vertebral body levels. IMPRESSION: 1. No acute intracranial abnormality. No intracranial mass, hemorrhage or edema. No skull fracture. 2. No fracture or acute subluxation within the cervical spine. Surgical fusion hardware at C5 through C7 appears intact and appropriately positioned. 3. Carotid atherosclerosis. Electronically Signed   By: Franki Cabot M.D.   On: 06/20/2019 15:12   CT ANGIO CHEST PE W OR WO CONTRAST  Result Date: 06/20/2019 CLINICAL DATA:  Syncope. Elevated D-dimer. EXAM: CT ANGIOGRAPHY CHEST WITH  CONTRAST TECHNIQUE: Multidetector CT imaging of the chest was performed using the standard protocol during bolus administration of intravenous contrast. Multiplanar CT image reconstructions and MIPs were obtained to evaluate the vascular anatomy. CONTRAST:  75mL OMNIPAQUE IOHEXOL 350 MG/ML SOLN COMPARISON:  None. FINDINGS: Cardiovascular: Contrast injection is sufficient to demonstrate satisfactory opacification of the pulmonary arteries to the segmental level. There is no pulmonary embolus. The main pulmonary artery is within normal limits for size. There is no CT evidence of acute right heart strain. There are atherosclerotic changes of the visualized thoracic aorta. There is no evidence for a thoracic aortic aneurysm. Heart size is normal, without pericardial effusion. Mediastinum/Nodes: --No mediastinal or hilar lymphadenopathy. --No axillary lymphadenopathy. --No supraclavicular lymphadenopathy. --Normal thyroid gland. --there is a small hiatal hernia. There are surgical  clips near the GE junction. Lungs/Pleura: Emphysematous changes are noted. There is no pneumothorax or pleural effusion. There is no infiltrate. Upper Abdomen: No acute abnormality. Musculoskeletal: No chest wall abnormality. No acute or significant osseous findings. Review of the MIP images confirms the above findings. IMPRESSION: 1. No evidence for acute pulmonary embolism. 2. Emphysematous changes in the lungs. 3. Small hiatal hernia. 4. Aortic Atherosclerosis (ICD10-I70.0) and Emphysema (ICD10-J43.9). Electronically Signed   By: Constance Holster M.D.   On: 06/20/2019 19:53   CT Cervical Spine Wo Contrast  Result Date: 06/20/2019 CLINICAL DATA:  Poly trauma. Multiple falls. EXAM: CT HEAD WITHOUT CONTRAST CT CERVICAL SPINE WITHOUT CONTRAST TECHNIQUE: Multidetector CT imaging of the head and cervical spine was performed following the standard protocol without intravenous contrast. Multiplanar CT image reconstructions of the cervical  spine were also generated. COMPARISON:  None. FINDINGS: CT HEAD FINDINGS Brain: Generalized parenchymal volume loss with commensurate dilatation of the ventricles and sulci. No mass, hemorrhage, edema or other evidence of acute parenchymal abnormality. No extra-axial hemorrhage. Vascular: Chronic calcified atherosclerotic changes of the large vessels at the skull base. No unexpected hyperdense vessel. Skull: Normal. Negative for fracture or focal lesion. Sinuses/Orbits: No acute finding. Other: None. CT CERVICAL SPINE FINDINGS Alignment: Normal alignment. No evidence of acute vertebral body subluxation. Skull base and vertebrae: No fracture line or displaced fracture fragment seen. Facet joints are normally aligned throughout. Soft tissues and spinal canal: No prevertebral fluid or swelling. No visible canal hematoma. Disc levels: C2-3 through C4-5 disc spaces are well preserved. Surgical fusion at C5 through C7. No more than mild central canal stenosis at any level. Upper chest: Negative. Other: Bilateral carotid atherosclerosis. Anterior cervical fusion hardware appears intact and appropriately positioned at the C5 through C7 vertebral body levels. IMPRESSION: 1. No acute intracranial abnormality. No intracranial mass, hemorrhage or edema. No skull fracture. 2. No fracture or acute subluxation within the cervical spine. Surgical fusion hardware at C5 through C7 appears intact and appropriately positioned. 3. Carotid atherosclerosis. Electronically Signed   By: Franki Cabot M.D.   On: 06/20/2019 15:12   US Carotid Bilateral  Result Date: 06/21/2019 CLINICAL DATA:  Stroke symptoms, hypertension, syncope, visual disturbance and diabetes EXAM: BILATERAL CAROTID DUPLEX ULTRASOUND TECHNIQUE: Pearline Cables scale imaging, color Doppler and duplex ultrasound were performed of bilateral carotid and vertebral arteries in the neck. COMPARISON:  None. FINDINGS: Criteria: Quantification of carotid stenosis is based on velocity  parameters that correlate the residual internal carotid diameter with NASCET-based stenosis levels, using the diameter of the distal internal carotid lumen as the denominator for stenosis measurement. The following velocity measurements were obtained: RIGHT ICA: 111/34 cm/sec CCA: 123XX123 cm/sec SYSTOLIC ICA/CCA RATIO:  0.9 ECA: 101 cm/sec LEFT ICA: 119/33 cm/sec CCA: 0000000 cm/sec SYSTOLIC ICA/CCA RATIO:  [id number] ECA: 157 cm/sec RIGHT CAROTID ARTERY: Minor echogenic shadowing plaque formation. No hemodynamically significant right ICA stenosis, velocity elevation, or turbulent flow. Degree of narrowing less than 50%. RIGHT VERTEBRAL ARTERY:  Antegrade LEFT CAROTID ARTERY: Similar scattered minor echogenic plaque formation. No hemodynamically significant left ICA stenosis, velocity elevation, or turbulent flow. LEFT VERTEBRAL ARTERY:  Antegrade IMPRESSION: Minor carotid atherosclerosis. No hemodynamically significant ICA stenosis. Degree of narrowing estimated at less than 50% bilaterally by ultrasound criteria. Patent antegrade vertebral flow bilaterally Electronically Signed   By: Jerilynn Mages.  Shick M.D.   On: 06/21/2019 14:26   DG Knee Complete 4 Views Left  Result Date: 06/20/2019 CLINICAL DATA:  Status post fall with left knee pain. EXAM: LEFT  KNEE - COMPLETE 4+ VIEW COMPARISON:  None. FINDINGS: No evidence of fracture, dislocation, or joint effusion. Bipartite patella is noted. Soft tissues are unremarkable. IMPRESSION: No acute fracture or dislocation. Electronically Signed   By: Abelardo Diesel M.D.   On: 06/20/2019 15:05   ECHOCARDIOGRAM COMPLETE  Result Date: 06/21/2019    ECHOCARDIOGRAM REPORT   Patient Name:   SHONNON DYBDAHL Date of Exam: 06/21/2019 Medical Rec #:  [id number]       Height:       64.0 in Accession #:    [id number]      Weight:       134.3 lb Date of Birth:  1955-11-27       BSA:          1.65 m Patient Age:    26 years        BP:           129/88 mmHg Patient Gender: F               HR:            94 bpm. Exam Location:  [name] Procedure: 2D Echo Indications:    Syncope  History:        Patient has prior history of Echocardiogram examinations, most                 recent 09/15/2012. CAD, COPD; Risk Factors:Hypertension, Diabetes                 and Dyslipidemia.  Sonographer:    Leavy Cella RDCS (AE) Referring Phys: [phone] Royanne Foots [place]  1. Left ventricular ejection fraction, by estimation, is 65 to 70%. The left ventricle has hyperdynamic function. The left ventricle has no regional wall motion abnormalities. There is mild concentric left ventricular hypertrophy. Left ventricular diastolic parameters are consistent with Grade I diastolic dysfunction (impaired relaxation).  2. Right ventricular systolic function is normal. The right ventricular size is normal.  3. The mitral valve is grossly normal. No evidence of mitral valve regurgitation.  4. The aortic valve is tricuspid. Aortic valve regurgitation is not visualized. No aortic stenosis is present.  5. The inferior vena cava is normal in size with greater than 50% respiratory variability, suggesting right atrial pressure of 3 mmHg. FINDINGS  Left Ventricle: Left ventricular ejection fraction, by estimation, is 65 to 70%. The left ventricle has hyperdynamic function. The left ventricle has no regional wall motion abnormalities. There is mild concentric left ventricular hypertrophy. Left ventricular diastolic parameters are consistent with Grade I diastolic dysfunction (impaired relaxation). Indeterminate filling pressures. Right Ventricle: The right ventricular size is normal. No increase in right ventricular wall thickness. Right ventricular systolic function is normal. Left Atrium: Left atrial size was normal in size. Right Atrium: Right atrial size was normal in size. Pericardium: There is no evidence of pericardial effusion. Mitral Valve: The mitral valve is grossly normal. No evidence of mitral valve regurgitation. Tricuspid Valve:  The tricuspid valve is grossly normal. Tricuspid valve regurgitation is not demonstrated. Aortic Valve: The aortic valve is tricuspid. Aortic valve regurgitation is not visualized. No aortic stenosis is present. Pulmonic Valve: The pulmonic valve was not well visualized. Pulmonic valve regurgitation is not visualized. Aorta: The aortic root is normal in size and structure. Venous: The inferior vena cava is normal in size with greater than 50% respiratory variability, suggesting right atrial pressure of 3 mmHg. IAS/Shunts: No atrial level shunt detected by color flow Doppler.  LEFT  VENTRICLE PLAX 2D LVIDd:         3.64 cm  Diastology LVIDs:         1.72 cm  LV e' lateral:   11.00 cm/s LV PW:         1.25 cm  LV E/e' lateral: 4.7 LV IVS:        1.13 cm  LV e' medial:    3.81 cm/s LVOT diam:     2.00 cm  LV E/e' medial:  13.6 LV SV Index:   28.39 LVOT Area:     3.14 cm  RIGHT VENTRICLE RV S prime:     9.68 cm/s TAPSE (M-mode): 1.6 cm LEFT ATRIUM             Index       RIGHT ATRIUM          Index LA diam:        2.60 cm 1.57 cm/m  RA Area:     8.25 cm LA Vol (A2C):   27.4 ml 16.59 ml/m RA Volume:   15.70 ml 9.51 ml/m LA Vol (A4C):   26.6 ml 16.11 ml/m LA Biplane Vol: 29.0 ml 17.56 ml/m   AORTA Ao Root diam: 2.60 cm MITRAL VALVE MV Area (PHT): 2.69 cm    SHUNTS MV Decel Time: 282 msec    Systemic Diam: 2.00 cm MV E velocity: 51.90 cm/s MV A velocity: 79.50 cm/s MV E/A ratio:  0.65 Kate Sable MD Electronically signed by Kate Sable MD Signature Date/Time: 06/21/2019/12:26:50 PM    Final    DG Hips Bilat W or Wo Pelvis 3-4 Views  Result Date: 06/20/2019 CLINICAL DATA:  Status post fall with hip pain. EXAM: DG HIP (WITH OR WITHOUT PELVIS) 3-4V BILAT COMPARISON:  None. FINDINGS: There is no evidence of hip fracture or dislocation. Calcifications are identified over the soft tissues of right hip. IMPRESSION: No acute fracture or dislocation. Electronically Signed   By: Abelardo Diesel M.D.   On:  06/20/2019 15:07      Discharge Exam: Vitals:   06/21/19 0504 06/21/19 1327  BP: 129/88 (!) 166/80  Pulse: 94 85  Resp: 18 17  Temp: 98.5 F (36.9 C) 98.2 F (36.8 C)  SpO2: 99% 98%   Vitals:   06/20/19 2055 06/20/19 2058 06/21/19 0504 06/21/19 1327  BP:  131/89 129/88 (!) 166/80  Pulse:  (!) 101 94 85  Resp:  18 18 17   Temp:  98.6 F (37 C) 98.5 F (36.9 C) 98.2 F (36.8 C)  TempSrc:   Oral   SpO2:  97% 99% 98%  Weight: 60.9 kg     Height:        General: Pt is alert, awake, not in acute distress Cardiovascular: RRR, S1/S2 +, no rubs, no gallops Respiratory: CTA bilaterally, no wheezing, no rhonchi Abdominal: Soft, NT, ND, bowel sounds + Extremities: no edema, no cyanosis    The results of significant diagnostics from this hospitalization (including imaging, microbiology, ancillary and laboratory) are listed below for reference.     Microbiology: Recent Results (from the past 240 hour(s))  Respiratory Panel by RT PCR (Flu A&B, Covid) - Nasopharyngeal Swab     Status: None   Collection Time: 06/20/19  2:40 PM   Specimen: Nasopharyngeal Swab  Result Value Ref Range Status   SARS Coronavirus 2 by RT PCR NEGATIVE NEGATIVE Final    Comment: (NOTE) SARS-CoV-2 target nucleic acids are NOT DETECTED. The SARS-CoV-2 RNA is generally detectable in upper  respiratoy specimens during the acute phase of infection. The lowest concentration of SARS-CoV-2 viral copies this assay can detect is 131 copies/mL. A negative result does not preclude SARS-Cov-2 infection and should not be used as the sole basis for treatment or other patient management decisions. A negative result may occur with  improper specimen collection/handling, submission of specimen other than nasopharyngeal swab, presence of viral mutation(s) within the areas targeted by this assay, and inadequate number of viral copies (<131 copies/mL). A negative result must be combined with clinical observations,  patient history, and epidemiological information. The expected result is Negative. Fact Sheet for Patients:  PinkCheek.be Fact Sheet for Healthcare Providers:  GravelBags.it This test is not yet ap proved or cleared by the [place] FDA and  has been authorized for detection and/or diagnosis of SARS-CoV-2 by FDA under an Emergency Use Authorization (EUA). This EUA will remain  in effect (meaning this test can be used) for the duration of the COVID-19 declaration under Section 564(b)(1) of the Act, 21 U.S.C. section 360bbb-3(b)(1), unless the authorization is terminated or revoked sooner.    Influenza A by PCR NEGATIVE NEGATIVE Final   Influenza B by PCR NEGATIVE NEGATIVE Final    Comment: (NOTE) The Xpert Xpress SARS-CoV-2/FLU/RSV assay is intended as an aid in  the diagnosis of influenza from Nasopharyngeal swab specimens and  should not be used as a sole basis for treatment. Nasal washings and  aspirates are unacceptable for Xpert Xpress SARS-CoV-2/FLU/RSV  testing. Fact Sheet for Patients: PinkCheek.be Fact Sheet for Healthcare Providers: GravelBags.it This test is not yet approved or cleared by the [place] FDA and  has been authorized for detection and/or diagnosis of SARS-CoV-2 by  FDA under an Emergency Use Authorization (EUA). This EUA will remain  in effect (meaning this test can be used) for the duration of the  Covid-19 declaration under Section 564(b)(1) of the Act, 21  U.S.C. section 360bbb-3(b)(1), unless the authorization is  terminated or revoked. Performed at [facility], [address], [place], [place] [address]      Labs: BNP (last 3 results) No results for input(s): BNP in the last 8760 hours. Basic Metabolic Panel: Recent Labs  Lab 06/20/19 1515 06/21/19 0532  NA 131* 133*  K 2.8* 3.2*  CL 86* 93*  CO2 29 29  GLUCOSE 389*  207*  BUN 31* 33*  CREATININE 1.61* 1.32*  CALCIUM 9.9 9.4  MG  --  2.4   Liver Function Tests: Recent Labs  Lab 06/20/19 1515  AST 29  ALT 33  ALKPHOS 110  BILITOT 1.6*  PROT 8.5*  ALBUMIN 4.6   Recent Labs  Lab 06/20/19 1515  LIPASE 98*   No results for input(s): AMMONIA in the last 168 hours. CBC: Recent Labs  Lab 06/20/19 1515 06/21/19 0532  WBC 10.3 8.5  NEUTROABS 7.8*  --   HGB 16.2* 14.0  HCT 50.6* 43.2  MCV 87.1 86.9  PLT 265 225   Cardiac Enzymes: Recent Labs  Lab 06/20/19 1515  CKTOTAL 92   BNP: Invalid input(s): POCBNP CBG: Recent Labs  Lab 06/20/19 2049 06/21/19 0733 06/21/19 1104  GLUCAP 288* 221* 310*   D-Dimer Recent Labs    06/20/19 1612  DDIMER 1.12*   Hgb A1c Recent Labs    06/20/19 1515  HGBA1C 8.6*   Lipid Profile No results for input(s): CHOL, HDL, LDLCALC, TRIG, CHOLHDL, LDLDIRECT in the last 72 hours. Thyroid function studies No results for input(s): TSH, T4TOTAL, T3FREE, THYROIDAB in  the last 72 hours.  Invalid input(s): FREET3 Anemia work up No results for input(s): VITAMINB12, FOLATE, FERRITIN, TIBC, IRON, RETICCTPCT in the last 72 hours. Urinalysis    Component Value Date/Time   COLORURINE YELLOW 06/20/2019 1657   APPEARANCEUR HAZY (A) 06/20/2019 1657   LABSPEC 1.018 06/20/2019 1657   PHURINE 6.0 06/20/2019 1657   GLUCOSEU >=500 (A) 06/20/2019 1657   HGBUR SMALL (A) 06/20/2019 1657   BILIRUBINUR NEGATIVE 06/20/2019 1657   KETONESUR NEGATIVE 06/20/2019 1657   PROTEINUR 100 (A) 06/20/2019 1657   UROBILINOGEN 1.0 12/05/2009 1908   NITRITE NEGATIVE 06/20/2019 1657   LEUKOCYTESUR NEGATIVE 06/20/2019 1657   Sepsis Labs Invalid input(s): PROCALCITONIN,  WBC,  LACTICIDVEN Microbiology Recent Results (from the past 240 hour(s))  Respiratory Panel by RT PCR (Flu A&B, Covid) - Nasopharyngeal Swab     Status: None   Collection Time: 06/20/19  2:40 PM   Specimen: Nasopharyngeal Swab  Result Value Ref Range  Status   SARS Coronavirus 2 by RT PCR NEGATIVE NEGATIVE Final    Comment: (NOTE) SARS-CoV-2 target nucleic acids are NOT DETECTED. The SARS-CoV-2 RNA is generally detectable in upper respiratoy specimens during the acute phase of infection. The lowest concentration of SARS-CoV-2 viral copies this assay can detect is 131 copies/mL. A negative result does not preclude SARS-Cov-2 infection and should not be used as the sole basis for treatment or other patient management decisions. A negative result may occur with  improper specimen collection/handling, submission of specimen other than nasopharyngeal swab, presence of viral mutation(s) within the areas targeted by this assay, and inadequate number of viral copies (<131 copies/mL). A negative result must be combined with clinical observations, patient history, and epidemiological information. The expected result is Negative. Fact Sheet for Patients:  PinkCheek.be Fact Sheet for Healthcare Providers:  GravelBags.it This test is not yet ap proved or cleared by the [place] FDA and  has been authorized for detection and/or diagnosis of SARS-CoV-2 by FDA under an Emergency Use Authorization (EUA). This EUA will remain  in effect (meaning this test can be used) for the duration of the COVID-19 declaration under Section 564(b)(1) of the Act, 21 U.S.C. section 360bbb-3(b)(1), unless the authorization is terminated or revoked sooner.    Influenza A by PCR NEGATIVE NEGATIVE Final   Influenza B by PCR NEGATIVE NEGATIVE Final    Comment: (NOTE) The Xpert Xpress SARS-CoV-2/FLU/RSV assay is intended as an aid in  the diagnosis of influenza from Nasopharyngeal swab specimens and  should not be used as a sole basis for treatment. Nasal washings and  aspirates are unacceptable for Xpert Xpress SARS-CoV-2/FLU/RSV  testing. Fact Sheet for  Patients: PinkCheek.be Fact Sheet for Healthcare Providers: GravelBags.it This test is not yet approved or cleared by the [place] FDA and  has been authorized for detection and/or diagnosis of SARS-CoV-2 by  FDA under an Emergency Use Authorization (EUA). This EUA will remain  in effect (meaning this test can be used) for the duration of the  Covid-19 declaration under Section 564(b)(1) of the Act, 21  U.S.C. section 360bbb-3(b)(1), unless the authorization is  terminated or revoked. Performed at [facility], [address] [place], [place] [address]      Time coordinating discharge: 35 minutes  SIGNED:   Rodena Goldmann, DO [facility] 06/21/2019, 3:10 PM  If 7PM-7AM, please contact night-coverage [email or web address]

## 2019-06-22 DIAGNOSIS — Z0001 Encounter for general adult medical examination with abnormal findings: Secondary | ICD-10-CM | POA: Diagnosis not present

## 2019-06-22 DIAGNOSIS — T783XXA Angioneurotic edema, initial encounter: Secondary | ICD-10-CM | POA: Diagnosis not present

## 2019-06-28 ENCOUNTER — Encounter (HOSPITAL_COMMUNITY): Payer: Self-pay

## 2019-06-28 ENCOUNTER — Inpatient Hospital Stay (HOSPITAL_COMMUNITY)
Admission: EM | Admit: 2019-06-28 | Discharge: 2019-07-02 | DRG: 378 | Disposition: A | Discharge status: Home Health | Payer: Medicare Other | Source: Ambulatory Visit | Attending: Family Medicine | Admitting: Family Medicine

## 2019-06-28 ENCOUNTER — Other Ambulatory Visit: Payer: Self-pay

## 2019-06-28 DIAGNOSIS — Z91048 Other nonmedicinal substance allergy status: Secondary | ICD-10-CM

## 2019-06-28 DIAGNOSIS — I69354 Hemiplegia and hemiparesis following cerebral infarction affecting left non-dominant side: Secondary | ICD-10-CM

## 2019-06-28 DIAGNOSIS — K5730 Diverticulosis of large intestine without perforation or abscess without bleeding: Secondary | ICD-10-CM | POA: Diagnosis present

## 2019-06-28 DIAGNOSIS — E119 Type 2 diabetes mellitus without complications: Secondary | ICD-10-CM

## 2019-06-28 DIAGNOSIS — R531 Weakness: Secondary | ICD-10-CM | POA: Diagnosis not present

## 2019-06-28 DIAGNOSIS — E1165 Type 2 diabetes mellitus with hyperglycemia: Secondary | ICD-10-CM | POA: Diagnosis not present

## 2019-06-28 DIAGNOSIS — Z7902 Long term (current) use of antithrombotics/antiplatelets: Secondary | ICD-10-CM

## 2019-06-28 DIAGNOSIS — Z981 Arthrodesis status: Secondary | ICD-10-CM

## 2019-06-28 DIAGNOSIS — E1143 Type 2 diabetes mellitus with diabetic autonomic (poly)neuropathy: Secondary | ICD-10-CM | POA: Diagnosis present

## 2019-06-28 DIAGNOSIS — Z8249 Family history of ischemic heart disease and other diseases of the circulatory system: Secondary | ICD-10-CM

## 2019-06-28 DIAGNOSIS — Z743 Need for continuous supervision: Secondary | ICD-10-CM | POA: Diagnosis not present

## 2019-06-28 DIAGNOSIS — Z90710 Acquired absence of both cervix and uterus: Secondary | ICD-10-CM

## 2019-06-28 DIAGNOSIS — J3089 Other allergic rhinitis: Secondary | ICD-10-CM | POA: Diagnosis present

## 2019-06-28 DIAGNOSIS — Z79899 Other long term (current) drug therapy: Secondary | ICD-10-CM

## 2019-06-28 DIAGNOSIS — K3184 Gastroparesis: Secondary | ICD-10-CM | POA: Diagnosis present

## 2019-06-28 DIAGNOSIS — E860 Dehydration: Secondary | ICD-10-CM | POA: Diagnosis present

## 2019-06-28 DIAGNOSIS — K589 Irritable bowel syndrome without diarrhea: Secondary | ICD-10-CM | POA: Diagnosis present

## 2019-06-28 DIAGNOSIS — K635 Polyp of colon: Secondary | ICD-10-CM | POA: Diagnosis present

## 2019-06-28 DIAGNOSIS — R7989 Other specified abnormal findings of blood chemistry: Secondary | ICD-10-CM | POA: Diagnosis not present

## 2019-06-28 DIAGNOSIS — I2510 Atherosclerotic heart disease of native coronary artery without angina pectoris: Secondary | ICD-10-CM | POA: Diagnosis present

## 2019-06-28 DIAGNOSIS — R55 Syncope and collapse: Secondary | ICD-10-CM | POA: Diagnosis not present

## 2019-06-28 DIAGNOSIS — F419 Anxiety disorder, unspecified: Secondary | ICD-10-CM | POA: Diagnosis present

## 2019-06-28 DIAGNOSIS — Z823 Family history of stroke: Secondary | ICD-10-CM

## 2019-06-28 DIAGNOSIS — Z20822 Contact with and (suspected) exposure to covid-19: Secondary | ICD-10-CM | POA: Diagnosis not present

## 2019-06-28 DIAGNOSIS — Z91013 Allergy to seafood: Secondary | ICD-10-CM

## 2019-06-28 DIAGNOSIS — K922 Gastrointestinal hemorrhage, unspecified: Secondary | ICD-10-CM | POA: Diagnosis present

## 2019-06-28 DIAGNOSIS — E1122 Type 2 diabetes mellitus with diabetic chronic kidney disease: Secondary | ICD-10-CM | POA: Diagnosis not present

## 2019-06-28 DIAGNOSIS — E1151 Type 2 diabetes mellitus with diabetic peripheral angiopathy without gangrene: Secondary | ICD-10-CM | POA: Diagnosis present

## 2019-06-28 DIAGNOSIS — R000 Tachycardia, unspecified: Secondary | ICD-10-CM | POA: Diagnosis not present

## 2019-06-28 DIAGNOSIS — E669 Obesity, unspecified: Secondary | ICD-10-CM | POA: Diagnosis present

## 2019-06-28 DIAGNOSIS — K449 Diaphragmatic hernia without obstruction or gangrene: Secondary | ICD-10-CM | POA: Diagnosis present

## 2019-06-28 DIAGNOSIS — N179 Acute kidney failure, unspecified: Secondary | ICD-10-CM | POA: Diagnosis not present

## 2019-06-28 DIAGNOSIS — D123 Benign neoplasm of transverse colon: Secondary | ICD-10-CM | POA: Diagnosis not present

## 2019-06-28 DIAGNOSIS — D62 Acute posthemorrhagic anemia: Secondary | ICD-10-CM | POA: Diagnosis present

## 2019-06-28 DIAGNOSIS — Z9049 Acquired absence of other specified parts of digestive tract: Secondary | ICD-10-CM

## 2019-06-28 DIAGNOSIS — Z8543 Personal history of malignant neoplasm of ovary: Secondary | ICD-10-CM

## 2019-06-28 DIAGNOSIS — E876 Hypokalemia: Secondary | ICD-10-CM | POA: Diagnosis present

## 2019-06-28 DIAGNOSIS — Z882 Allergy status to sulfonamides status: Secondary | ICD-10-CM

## 2019-06-28 DIAGNOSIS — K219 Gastro-esophageal reflux disease without esophagitis: Secondary | ICD-10-CM | POA: Diagnosis present

## 2019-06-28 DIAGNOSIS — K228 Other specified diseases of esophagus: Secondary | ICD-10-CM | POA: Diagnosis not present

## 2019-06-28 DIAGNOSIS — K921 Melena: Principal | ICD-10-CM | POA: Diagnosis present

## 2019-06-28 DIAGNOSIS — Z87891 Personal history of nicotine dependence: Secondary | ICD-10-CM

## 2019-06-28 DIAGNOSIS — Z95828 Presence of other vascular implants and grafts: Secondary | ICD-10-CM

## 2019-06-28 DIAGNOSIS — Z560 Unemployment, unspecified: Secondary | ICD-10-CM

## 2019-06-28 DIAGNOSIS — Z794 Long term (current) use of insulin: Secondary | ICD-10-CM

## 2019-06-28 DIAGNOSIS — R739 Hyperglycemia, unspecified: Secondary | ICD-10-CM

## 2019-06-28 DIAGNOSIS — Z885 Allergy status to narcotic agent status: Secondary | ICD-10-CM

## 2019-06-28 DIAGNOSIS — E785 Hyperlipidemia, unspecified: Secondary | ICD-10-CM | POA: Diagnosis not present

## 2019-06-28 DIAGNOSIS — E1140 Type 2 diabetes mellitus with diabetic neuropathy, unspecified: Secondary | ICD-10-CM | POA: Diagnosis present

## 2019-06-28 DIAGNOSIS — N1832 Chronic kidney disease, stage 3b: Secondary | ICD-10-CM | POA: Diagnosis not present

## 2019-06-28 DIAGNOSIS — R296 Repeated falls: Secondary | ICD-10-CM | POA: Diagnosis present

## 2019-06-28 DIAGNOSIS — Z03818 Encounter for observation for suspected exposure to other biological agents ruled out: Secondary | ICD-10-CM | POA: Diagnosis not present

## 2019-06-28 DIAGNOSIS — I1310 Hypertensive heart and chronic kidney disease without heart failure, with stage 1 through stage 4 chronic kidney disease, or unspecified chronic kidney disease: Secondary | ICD-10-CM | POA: Diagnosis present

## 2019-06-28 DIAGNOSIS — I10 Essential (primary) hypertension: Secondary | ICD-10-CM | POA: Diagnosis not present

## 2019-06-28 DIAGNOSIS — R42 Dizziness and giddiness: Secondary | ICD-10-CM

## 2019-06-28 DIAGNOSIS — Z833 Family history of diabetes mellitus: Secondary | ICD-10-CM

## 2019-06-28 DIAGNOSIS — Z6822 Body mass index (BMI) 22.0-22.9, adult: Secondary | ICD-10-CM

## 2019-06-28 DIAGNOSIS — I252 Old myocardial infarction: Secondary | ICD-10-CM

## 2019-06-28 DIAGNOSIS — Z888 Allergy status to other drugs, medicaments and biological substances status: Secondary | ICD-10-CM

## 2019-06-28 DIAGNOSIS — Z8711 Personal history of peptic ulcer disease: Secondary | ICD-10-CM

## 2019-06-28 DIAGNOSIS — Z9889 Other specified postprocedural states: Secondary | ICD-10-CM | POA: Diagnosis not present

## 2019-06-28 DIAGNOSIS — Z7982 Long term (current) use of aspirin: Secondary | ICD-10-CM

## 2019-06-28 LAB — GLUCOSE, CAPILLARY: Glucose-Capillary: 263 mg/dL — ABNORMAL HIGH (ref 70–99)

## 2019-06-28 LAB — MAGNESIUM: Magnesium: 2.6 mg/dL — ABNORMAL HIGH (ref 1.7–2.4)

## 2019-06-28 LAB — LACTIC ACID, PLASMA
Lactic Acid, Venous: 1.8 mmol/L (ref 0.5–1.9)
Lactic Acid, Venous: 1.9 mmol/L (ref 0.5–1.9)

## 2019-06-28 LAB — CBC WITH DIFFERENTIAL/PLATELET
Abs Immature Granulocytes: 0.06 10*3/uL (ref 0.00–0.07)
Basophils Absolute: 0.1 10*3/uL (ref 0.0–0.1)
Basophils Relative: 1 %
Eosinophils Absolute: 0.1 10*3/uL (ref 0.0–0.5)
Eosinophils Relative: 1 %
HCT: 46.9 % — ABNORMAL HIGH (ref 36.0–46.0)
Hemoglobin: 15.3 g/dL — ABNORMAL HIGH (ref 12.0–15.0)
Immature Granulocytes: 1 %
Lymphocytes Relative: 19 %
Lymphs Abs: 2 10*3/uL (ref 0.7–4.0)
MCH: 28.3 pg (ref 26.0–34.0)
MCHC: 32.6 g/dL (ref 30.0–36.0)
MCV: 86.9 fL (ref 80.0–100.0)
Monocytes Absolute: 0.9 10*3/uL (ref 0.1–1.0)
Monocytes Relative: 9 %
Neutro Abs: 7.3 10*3/uL (ref 1.7–7.7)
Neutrophils Relative %: 69 %
Platelets: 284 10*3/uL (ref 150–400)
RBC: 5.4 MIL/uL — ABNORMAL HIGH (ref 3.87–5.11)
RDW: 12.7 % (ref 11.5–15.5)
WBC: 10.4 10*3/uL (ref 4.0–10.5)
nRBC: 0.2 % (ref 0.0–0.2)

## 2019-06-28 LAB — COMPREHENSIVE METABOLIC PANEL
ALT: 35 U/L (ref 0–44)
AST: 33 U/L (ref 15–41)
Albumin: 4.6 g/dL (ref 3.5–5.0)
Alkaline Phosphatase: 119 U/L (ref 38–126)
Anion gap: 16 — ABNORMAL HIGH (ref 5–15)
BUN: 25 mg/dL — ABNORMAL HIGH (ref 8–23)
CO2: 30 mmol/L (ref 22–32)
Calcium: 10.2 mg/dL (ref 8.9–10.3)
Chloride: 88 mmol/L — ABNORMAL LOW (ref 98–111)
Creatinine, Ser: 1.46 mg/dL — ABNORMAL HIGH (ref 0.44–1.00)
GFR calc Af Amer: 44 mL/min — ABNORMAL LOW (ref >60)
GFR calc non Af Amer: 38 mL/min — ABNORMAL LOW (ref >60)
Glucose, Bld: 323 mg/dL — ABNORMAL HIGH (ref 70–99)
Potassium: 2.7 mmol/L — CL (ref 3.5–5.1)
Sodium: 134 mmol/L — ABNORMAL LOW (ref 135–145)
Total Bilirubin: 1.1 mg/dL (ref 0.3–1.2)
Total Protein: 8.6 g/dL — ABNORMAL HIGH (ref 6.5–8.1)

## 2019-06-28 LAB — BLOOD GAS, VENOUS
Acid-Base Excess: 7.1 mmol/L — ABNORMAL HIGH (ref 0.0–2.0)
Bicarbonate: 28.8 mmol/L — ABNORMAL HIGH (ref 20.0–28.0)
FIO2: 21
O2 Saturation: 70.3 %
Patient temperature: 37
pCO2, Ven: 53.8 mmHg (ref 44.0–60.0)
pH, Ven: 7.392 (ref 7.250–7.430)
pO2, Ven: 40.4 mmHg (ref 32.0–45.0)

## 2019-06-28 LAB — HEMOGLOBIN A1C
Hgb A1c MFr Bld: 9.1 % — ABNORMAL HIGH (ref 4.8–5.6)
Mean Plasma Glucose: 214.47 mg/dL

## 2019-06-28 LAB — CBG MONITORING, ED
Glucose-Capillary: 300 mg/dL — ABNORMAL HIGH (ref 70–99)
Glucose-Capillary: 238 mg/dL — ABNORMAL HIGH (ref 70–99)

## 2019-06-28 LAB — TROPONIN I (HIGH SENSITIVITY)
Troponin I (High Sensitivity): 36 ng/L — ABNORMAL HIGH (ref <18)
Troponin I (High Sensitivity): 33 ng/L — ABNORMAL HIGH (ref <18)

## 2019-06-28 MED ORDER — INSULIN ASPART 100 UNIT/ML ~~LOC~~ SOLN
0.0000 [IU] | Freq: Every day | SUBCUTANEOUS | Status: DC
  Administered 2019-06-28: 3 [IU] via SUBCUTANEOUS
  Administered 2019-07-01: 2 [IU] via SUBCUTANEOUS

## 2019-06-28 MED ORDER — SODIUM CHLORIDE 0.9 % IV BOLUS
1000.0000 mL | Freq: Once | INTRAVENOUS | Status: AC
  Administered 2019-06-28: 1000 mL via INTRAVENOUS

## 2019-06-28 MED ORDER — ROSUVASTATIN CALCIUM 20 MG PO TABS
40.0000 mg | ORAL_TABLET | Freq: Every day | ORAL | Status: DC
  Administered 2019-06-28 – 2019-07-02 (×4): 40 mg via ORAL
  Filled 2019-06-28 (×4): qty 2

## 2019-06-28 MED ORDER — POTASSIUM CHLORIDE IN NACL 40-0.9 MEQ/L-% IV SOLN
INTRAVENOUS | Status: AC
  Administered 2019-06-29: 50 mL/h via INTRAVENOUS

## 2019-06-28 MED ORDER — ACETAMINOPHEN 325 MG PO TABS
650.0000 mg | ORAL_TABLET | Freq: Four times a day (QID) | ORAL | Status: DC | PRN
  Administered 2019-06-29: 650 mg via ORAL
  Filled 2019-06-28: qty 2

## 2019-06-28 MED ORDER — ASPIRIN EC 81 MG PO TBEC
81.0000 mg | DELAYED_RELEASE_TABLET | Freq: Every day | ORAL | Status: DC
  Administered 2019-06-28 – 2019-06-30 (×3): 81 mg via ORAL
  Filled 2019-06-28 (×3): qty 1

## 2019-06-28 MED ORDER — POTASSIUM CHLORIDE 10 MEQ/100ML IV SOLN
10.0000 meq | INTRAVENOUS | Status: AC
  Administered 2019-06-28 – 2019-06-29 (×2): 10 meq via INTRAVENOUS
  Filled 2019-06-28 (×2): qty 100

## 2019-06-28 MED ORDER — INSULIN ASPART 100 UNIT/ML ~~LOC~~ SOLN
0.0000 [IU] | Freq: Three times a day (TID) | SUBCUTANEOUS | Status: DC
  Administered 2019-06-28: 5 [IU] via SUBCUTANEOUS
  Administered 2019-06-29 (×3): 3 [IU] via SUBCUTANEOUS
  Administered 2019-06-30: 5 [IU] via SUBCUTANEOUS
  Administered 2019-06-30 – 2019-07-01 (×3): 3 [IU] via SUBCUTANEOUS

## 2019-06-28 MED ORDER — SUMATRIPTAN SUCCINATE 50 MG PO TABS
50.0000 mg | ORAL_TABLET | ORAL | Status: DC | PRN
  Administered 2019-06-29 – 2019-07-01 (×3): 50 mg via ORAL
  Filled 2019-06-28 (×3): qty 1

## 2019-06-28 MED ORDER — PROPRANOLOL HCL ER 60 MG PO CP24
60.0000 mg | ORAL_CAPSULE | Freq: Every evening | ORAL | Status: DC
  Administered 2019-06-28 – 2019-06-30 (×2): 60 mg via ORAL
  Filled 2019-06-28 (×8): qty 1

## 2019-06-28 MED ORDER — POTASSIUM CHLORIDE 10 MEQ/100ML IV SOLN
INTRAVENOUS | Status: AC
  Administered 2019-06-28: 10 meq via INTRAVENOUS
  Filled 2019-06-28: qty 100

## 2019-06-28 MED ORDER — POTASSIUM CHLORIDE 10 MEQ/100ML IV SOLN
10.0000 meq | INTRAVENOUS | Status: AC
  Administered 2019-06-28 (×2): 10 meq via INTRAVENOUS
  Filled 2019-06-28: qty 100

## 2019-06-28 MED ORDER — CLOPIDOGREL BISULFATE 75 MG PO TABS
75.0000 mg | ORAL_TABLET | Freq: Every day | ORAL | Status: DC
  Administered 2019-06-28 – 2019-06-30 (×3): 75 mg via ORAL
  Filled 2019-06-28 (×2): qty 1

## 2019-06-28 MED ORDER — PANTOPRAZOLE SODIUM 40 MG PO TBEC
40.0000 mg | DELAYED_RELEASE_TABLET | Freq: Every day | ORAL | Status: DC
  Administered 2019-06-28 – 2019-06-29 (×2): 40 mg via ORAL
  Filled 2019-06-28 (×2): qty 1

## 2019-06-28 MED ORDER — POTASSIUM CHLORIDE 10 MEQ/100ML IV SOLN
10.0000 meq | INTRAVENOUS | Status: AC
  Filled 2019-06-28: qty 100

## 2019-06-28 MED ORDER — CLONIDINE HCL 0.1 MG PO TABS
0.1000 mg | ORAL_TABLET | Freq: Every day | ORAL | Status: DC
  Administered 2019-06-28 – 2019-07-01 (×4): 0.1 mg via ORAL
  Filled 2019-06-28 (×4): qty 1

## 2019-06-28 MED ORDER — ACETAMINOPHEN 650 MG RE SUPP: 650.0000 mg | Freq: Four times a day (QID) | RECTAL | Status: DC | PRN

## 2019-06-28 MED ORDER — POTASSIUM CHLORIDE CRYS ER 20 MEQ PO TBCR
20.0000 meq | EXTENDED_RELEASE_TABLET | Freq: Once | ORAL | Status: AC
  Administered 2019-06-28: 21:00:00 20 meq via ORAL
  Filled 2019-06-28: qty 1

## 2019-06-28 MED ORDER — ENOXAPARIN SODIUM 40 MG/0.4ML ~~LOC~~ SOLN
40.0000 mg | SUBCUTANEOUS | Status: DC
  Administered 2019-06-28: 40 mg via SUBCUTANEOUS
  Filled 2019-06-28: qty 0.4

## 2019-06-28 MED ORDER — METFORMIN HCL 500 MG PO TABS
500.0000 mg | ORAL_TABLET | Freq: Two times a day (BID) | ORAL | Status: DC
  Filled 2019-06-28 (×3): qty 1

## 2019-06-28 NOTE — Progress Notes (Signed)
Pt arrived for continued medical treatment. Alert and oriented on room air, potassium infusing at time of transfer.  Pt stated she does not need orienting to room.  Call bell within reach.

## 2019-06-28 NOTE — ED Provider Notes (Signed)
[facility] EMERGENCY DEPARTMENT Provider Note   CSN: [id number] Arrival date & time: 06/28/19  1236     History Chief Complaint  Patient presents with  . Hyperglycemia    [name] is a 64 y.o. female.  Patient with history of diabetes, previous stroke with left-sided weakness, peripheral artery disease, MI, acid reflux disease presenting from her PCPs office with elevated blood sugar, weakness, lethargy and "dehydration".  She was being seen in follow-up after her hospitalization last week.  Patient was admitted for dizziness and what she thought was vertigo.  She was discharged with cardiology follow-up and a Holter monitor for suspicion for possible cardiogenic syncope.  She states that her follow-up office appointment today she was found to be hyperglycemic and tachycardic to the 140s and appeared to be dehydrated.  Patient states she has not felt well ever since she left the hospital.  She feels dizzy and lightheaded as well as has a very dry mouth with increased thirst.  No vomiting or diarrhea.  No fever.  No chest pain or shortness of breath.  States she is wearing a Holter monitor as prescribed.  She feels lightheaded when she changes positions and feels like she is extremely thirsty and dehydrated.  Blood sugars have been "up and down" at home.  The history is provided by the patient and the EMS personnel.  Hyperglycemia Associated symptoms: dizziness, fatigue and weakness   Associated symptoms: no abdominal pain, no chest pain, no dysuria, no fever, no nausea, no shortness of breath and no vomiting        Past Medical History:  Diagnosis Date  . 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 Anemia    hx  . Angioedema 07/10/2015  . Anxiety   . Arthritis    "neck, left hand" (09/14/2012)  . Asthma   . Cancer ([place]) 1985   ovarian, no treatment except surgery  . Complication of anesthesia    OCCASIONAL TROUBLE TURNING NECK TO RIGHT  . Critical lower limb ischemia    10/2014 s/p L  SFA stenting  . Diverticulosis of colon with hemorrhage April 2013  . GERD (gastroesophageal reflux disease)   . H/O hiatal hernia   . Heart attack [facility])    2003 mild MI, March 2013 mild MI  . Hidradenitis    groin  . History of blood transfusion 1985 AND 2013  . Hyperlipidemia   . Hypertension   . Irritable bowel syndrome   . Left-sided weakness    since stroke, left eye trouble seeing  . Migraines   . Mild CAD    a. Cath 09/2010: mild luminal irregularities of LAD, 30% prox RCA and 20-30% mRCA, EF 65%.  . Neuropathy   . Obesity   . PAD (peripheral artery disease) ([place])    a. critical limb ischemia s/p PTA/stenting of L SFA 10/2014. c. occ prior SFA stent by angio 01/2016, for possible PV bypass.  . Pneumonia    baby  . Recurrent upper respiratory infection (URI)   . S/P arterial stent-mid Lt SFA 11/03/14 11/04/2014  . Schatzki's ring   . Sinus problem   . Stomach ulcer 1972   non-bleeding  . Stroke ([place]) 07-2007, 07-2008, 07-2009   total 3 strokes; mild left sided weakness and left eye "jumps".  . Type II diabetes mellitus ([place])   . Vitamin B 12 deficiency 07-15-2013    Patient Active Problem List   Diagnosis Date Noted  . Syncope and collapse 06/20/2019  . Tobacco abuse  05/04/2019  . Diabetic gastroparesis associated with type 2 diabetes mellitus ([place]) 11/25/2017  . Precordial chest pain 12/24/2016  . Nausea & vomiting 12/24/2016  . PAD (peripheral artery disease) ([place]) 06/19/2016  . Diabetic neuropathy ([place]) 01/31/2016  . Obesity 01/31/2016  . Mild CAD   . Claudication ([place]) 01/29/2016  . Food allergy 08/02/2015  . Recurrent urticaria 07/10/2015  . Angioedema 07/10/2015  . Perennial allergic rhinitis with a possible non-allergic component 07/10/2015  . History of asthma 07/10/2015  . S/P femoral-popliteal bypass surgery 11/04/2014  . Critical lower limb ischemia 11/03/2014  . Peripheral arterial disease ([place]) 11/02/2014  . HNP (herniated nucleus pulposus) with  myelopathy, cervical 04/11/2014  . HNP (herniated nucleus pulposus), cervical 04/08/2014  . Pancreatitis 01/02/2013  . Abdominal pain, epigastric 12/14/2012  . IBS (irritable bowel syndrome) 09/12/2011  . GERD (gastroesophageal reflux disease) 09/12/2011  . Diverticulosis of colon with hemorrhage 08/10/2011  . Chest pain at rest 07/06/2011    Class: Acute  . Hidradenitis, left pubic area and left lower quadrant 01/18/2011  . DM2 (diabetes mellitus, type 2) ([place]) 08/24/2007  . Hyperlipidemia LDL goal <70 08/24/2007  . Essential hypertension 08/24/2007    Past Surgical History:  Procedure Laterality Date  . ABDOMINAL HYSTERECTOMY  1985  . ADENOIDECTOMY    . ANTERIOR CERVICAL DECOMP/DISCECTOMY FUSION  2002  . ANTERIOR CERVICAL DECOMP/DISCECTOMY FUSION N/A 04/08/2014   Procedure: Cervical Six-Seven ANTERIOR CERVICAL DECOMPRESSION/DISCECTOMY FUSION Plating and Bonegraft  2 LEVELS;  Surgeon: Ashok Pall, MD;  Location: [place] NEURO ORS;  Service: Neurosurgery;  Laterality: N/A;  Cervical Six-Seven ANTERIOR CERVICAL DECOMPRESSION/DISCECTOMY FUSION Plating and Bonegraft  2 LEVELS  . APPENDECTOMY  1985  . AXILLARY HIDRADENITIS EXCISION  1990-2008   bilateral  . BACK SURGERY    . BREAST BIOPSY Right 2007  . BREAST CYST EXCISION Right 2008  . BREAST REDUCTION SURGERY    . CARDIAC CATHETERIZATION  2004   mild disease  . CATARACT EXTRACTION W/PHACO Left 05/16/2015   Procedure: CATARACT EXTRACTION PHACO AND INTRAOCULAR LENS PLACEMENT (IOC);  Surgeon: Rutherford Guys, MD;  Location: [facility] ORS;  Service: Ophthalmology;  Laterality: Left;  CDE: 4.24  . CATARACT EXTRACTION W/PHACO Right 05/30/2015   Procedure: CATARACT EXTRACTION RIGHT EYE PHACO AND INTRAOCULAR LENS PLACEMENT ;  Surgeon: Rutherford Guys, MD;  Location: [facility] ORS;  Service: Ophthalmology;  Laterality: Right;  CDE:4.08  . CHOLECYSTECTOMY  1990's  . COLONOSCOPY  08/12/2011   Procedure: COLONOSCOPY;  Surgeon: Ladene Artist, MD,FACG;  Location: [facility]  ENDOSCOPY;  Service: Endoscopy;  Laterality: N/A;  . COLONOSCOPY  06/19/2006  . cyst thigh Right   . ESOPHAGOGASTRODUODENOSCOPY  08/12/2011   Procedure: ESOPHAGOGASTRODUODENOSCOPY (EGD);  Surgeon: Ladene Artist, MD,FACG;  Location: [facility] ENDOSCOPY;  Service: Endoscopy;  Laterality: N/A;  . ESOPHAGOGASTRODUODENOSCOPY  06/04/2005  . FEMORAL-POPLITEAL BYPASS GRAFT Left 06/19/2016   Procedure: Left Leg BYPASS GRAFT FEMORAL-POPLITEAL ARTERY;  Surgeon: Serafina Mitchell, MD;  Location: [place];  Service: Vascular;  Laterality: Left;  . GIVENS CAPSULE STUDY  08/13/2011   Procedure: GIVENS CAPSULE STUDY;  Surgeon: Ladene Artist, MD,FACG;  Location: [facility] ENDOSCOPY;  Service: Endoscopy;  Laterality: N/A;  . HAMMER TOE SURGERY Bilateral ~ 2000  . HIATAL HERNIA REPAIR    . HYDRADENITIS EXCISION  01/2011; 03/2012   'groin and abdomen; 03/2012" (09/14/2012)  . HYDRADENITIS EXCISION  04/01/2012   Procedure: EXCISION HYDRADENITIS GROIN;  Surgeon: Pedro Earls, MD;  Location: [facility] ORS;  Service: General;  Laterality: Bilateral;  Excision of Hydradenitis  of Perineum  . HYDRADENITIS EXCISION N/A 09/17/2013   Procedure: EXCISION PERINEAL HIDRADENITIS ;  Surgeon: Pedro Earls, MD;  Location: [facility] ORS;  Service: General;  Laterality: N/A;  also in the pubis area  . LEFT HEART CATH AND CORONARY ANGIOGRAPHY N/A 12/26/2016   Procedure: LEFT HEART CATH AND CORONARY ANGIOGRAPHY;  Surgeon: [name], Peter M, MD;  Location: [place] CV LAB;  Service: Cardiovascular;  Laterality: N/A;  . MASS EXCISION Right 09/17/2013   Procedure: EXCISION MASS;  Surgeon: Pedro Earls, MD;  Location: [facility] ORS;  Service: General;  Laterality: Right;  . NISSEN FUNDOPLICATION  99991111  . PERIPHERAL VASCULAR CATHETERIZATION N/A 11/03/2014   Procedure: Lower Extremity Angiography;  Surgeon: Lorretta Harp, MD;  Location: [place] CV LAB;  Service: Cardiovascular;  Laterality: N/A;  . PERIPHERAL VASCULAR CATHETERIZATION N/A 01/29/2016   Procedure:  Lower Extremity Angiography;  Surgeon: Lorretta Harp, MD;  Location: [place] CV LAB;  Service: Cardiovascular;  Laterality: N/A;  . POSTERIOR LUMBAR FUSION  2008 X 2  . REDUCTION MAMMAPLASTY  1996?  . TONSILLECTOMY AND ADENOIDECTOMY  1959 AND 2000  . UVULOPALATOPHARYNGOPLASTY, TONSILLECTOMY AND SEPTOPLASTY  2000's     OB History   No obstetric history on file.     Family History  Problem Relation Age of Onset  . Breast cancer Mother   . Heart disease Mother   . Hypertension Mother   . Diabetes Father   . Heart disease Father   . Stroke Father   . Heart disease Sister   . Hypertension Sister   . Hypertension Sister   . Hyperlipidemia Sister   . Diabetes Sister   . Allergic rhinitis Sister   . Emphysema Other        great uncle  . Aneurysm Sister        brain  . Angioedema Neg Hx   . Asthma Neg Hx   . Eczema Neg Hx   . Immunodeficiency Neg Hx   . Urticaria Neg Hx     Social History   Tobacco Use  . Smoking status: Former Smoker    Packs/day: 0.50    Years: 41.00    Pack years: 20.50    Types: Cigarettes    Quit date: 01/15/2016    Years since quitting: 3.4  . Smokeless tobacco: Never Used  . Tobacco comment: Getting ready to start nicotine patches RX by Dr. Gwenlyn Found per pt.  Substance Use Topics  . Alcohol use: No    Alcohol/week: 0.0 standard drinks  . Drug use: No    Types: "Crack" cocaine    Comment: 05/09/2015.  "quit 07/27/1994"    Home Medications Prior to Admission medications   Medication Sig Start Date End Date Taking? Authorizing Provider  albuterol (PROAIR HFA) 108 (90 Base) MCG/ACT inhaler 1-2 inhalations every 4-6 hours as needed for cough or wheeze. Patient taking differently: Inhale 1-2 puffs into the lungs every 4 (four) hours as needed for shortness of breath.  07/10/15   Bobbitt, Sedalia Muta, MD  aspirin EC 81 MG tablet Take 81 mg by mouth daily.    [provider]  Azelastine HCl 0.15 % SOLN Place 2 sprays into both nostrils 2  (two) times daily. Patient taking differently: Place 2 sprays into both nostrils 2 (two) times daily as needed (allergies).  07/10/15   Bobbitt, [place] Muta, MD  chlorthalidone (HYGROTON) 25 MG tablet Take 1 tablet (25 mg total) by mouth daily. 06/08/19 09/06/19  Lorretta Harp,  MD  citalopram (CELEXA) 10 MG tablet Take 10 mg by mouth daily.    [provider]  cloNIDine (CATAPRES) 0.1 MG tablet Take 0.1 mg by mouth at bedtime. 05/30/19   [provider]  clopidogrel (PLAVIX) 75 MG tablet TAKE 1 TABLET BY MOUTH EVERY DAY WITH SUPPER - NEEDS OFFICE VISIT Patient taking differently: Take 75 mg by mouth daily.  06/14/19   Lorretta Harp, MD  EPINEPHrine 0.3 mg/0.3 mL IJ SOAJ injection Inject 0.3 mLs (0.3 mg total) into the muscle once. 08/02/15   Bobbitt, Sed[place] Muta, MD  HYDROcodone-acetaminophen (NORCO) 7.5-325 MG tablet Take 1 tablet by mouth daily as needed for moderate pain.  03/09/19   [provider]  insulin lispro (HUMALOG) 100 UNIT/ML injection Inject 5-12 Units into the skin 2 (two) times daily before a meal. Per sliding scale using OMNIPOD    [provider]  isosorbide mononitrate (IMDUR) 30 MG 24 hr tablet Take 1 tablet (30 mg total) by mouth daily. 01/07/17   Barrett, Evelene Croon, PA-C  losartan (COZAAR) 50 MG tablet Take 1 tablet (50 mg total) by mouth daily. 05/04/19   Lorretta Harp, MD  mupirocin ointment (BACTROBAN) 2 % Apply 1 application topically 2 (two) times daily. 06/02/19   Wurst, [place], PA-C  naproxen (NAPROSYN) 375 MG tablet Take 1 tablet (375 mg total) by mouth 2 (two) times daily. Patient not taking: Reported on 06/20/2019 03/26/19   Wurst, [place], PA-C  nitroGLYCERIN (NITROSTAT) 0.4 MG SL tablet PLACE 1 TAB UNDER TONGUE EVERY 5 MINS AS NEEDED FOR CHEST PAIN - MAX 3 DOSES THEN 911 Patient taking differently: Place 0.4 mg under the tongue every 5 (five) minutes as needed for chest pain. MAX 3 DOSES THEN 911 11/30/18   Barrett, Evelene Croon, PA-C   pantoprazole (PROTONIX) 40 MG tablet TAKE 1 TABLET BY MOUTH EVERY DAY 01/07/17   Lorretta Harp, MD  potassium chloride (KLOR-CON) 10 MEQ tablet Take 1 tablet (10 mEq total) by mouth 2 (two) times daily. 06/21/19 07/21/19  Manuella Ghazi, Pratik D, DO  propranolol ER (INDERAL LA) 60 MG 24 hr capsule Take 1 capsule (60 mg total) by mouth every evening. 01/07/17   Barrett, Evelene Croon, PA-C  rosuvastatin (CRESTOR) 40 MG tablet Take 40 mg by mouth every morning.     [provider]  SUMAtriptan (IMITREX) 50 MG tablet Take 50 mg by mouth every 2 (two) hours as needed for migraine.    [provider]    Allergies    Sulfa antibiotics, Codeine, Fish allergy, Iodine, Metformin and related, Soliqua [insulin glargine-lixisenatide], Adhesive [tape], and Shellfish allergy  Review of Systems   Review of Systems  Constitutional: Positive for activity change, appetite change and fatigue. Negative for fever.  HENT: Negative for congestion and rhinorrhea.   Respiratory: Negative for cough, chest tightness and shortness of breath.   Cardiovascular: Negative for chest pain.  Gastrointestinal: Negative for abdominal pain, nausea and vomiting.  Genitourinary: Negative for dysuria and hematuria.  Musculoskeletal: Positive for arthralgias and myalgias.  Skin: Negative for rash.  Neurological: Positive for dizziness, weakness and light-headedness. Negative for headaches.   all other systems are negative except as noted in the HPI and PMH.    Physical Exam Updated Vital Signs BP 134/81 (BP Location: Left Arm)   Pulse 99   Temp 98.3 F (36.8 C) (Oral)   Resp 18   Ht 5\' 4"  (1.626 m)   Wt 60 kg   SpO2 100%  BMI 22.71 kg/m   Physical Exam Vitals and nursing note reviewed.  Constitutional:      General: She is not in acute distress.    Appearance: She is well-developed.  HENT:     Head: Normocephalic and atraumatic.     Mouth/Throat:     Mouth: Mucous membranes are dry.     Pharynx: No  oropharyngeal exudate.  Eyes:     Conjunctiva/sclera: Conjunctivae normal.     Pupils: Pupils are equal, round, and reactive to light.  Neck:     Comments: No meningismus. Cardiovascular:     Rate and Rhythm: Normal rate and regular rhythm.     Heart sounds: Normal heart sounds. No murmur.  Pulmonary:     Effort: Pulmonary effort is normal. No respiratory distress.     Breath sounds: Normal breath sounds.  Chest:     Chest wall: No tenderness.  Abdominal:     Palpations: Abdomen is soft.     Tenderness: There is no abdominal tenderness. There is no guarding or rebound.  Musculoskeletal:        General: No tenderness. Normal range of motion.     Cervical back: Normal range of motion and neck supple.  Skin:    General: Skin is warm.     Capillary Refill: Capillary refill takes less than 2 seconds.  Neurological:     General: No focal deficit present.     Mental Status: She is alert and oriented to person, place, and time. Mental status is at baseline.     Cranial Nerves: No cranial nerve deficit.     Motor: No abnormal muscle tone.     Coordination: Coordination normal.     Comments: Mild L sided weakness at baseline. CN 2-12 intact.  R sided strength 5/5 4/5 on L.  No ataxia on finger to nose.   Psychiatric:        Behavior: Behavior normal.     ED Results / Procedures / Treatments   Labs (all labs ordered are listed, but only abnormal results are displayed) Labs Reviewed  CBC WITH DIFFERENTIAL/PLATELET - Abnormal; Notable for the following components:      Result Value   RBC 5.40 (*)    Hemoglobin 15.3 (*)    HCT 46.9 (*)    All other components within normal limits  COMPREHENSIVE METABOLIC PANEL - Abnormal; Notable for the following components:   Sodium 134 (*)    Potassium 2.7 (*)    Chloride 88 (*)    Glucose, Bld 323 (*)    BUN 25 (*)    Creatinine, Ser 1.46 (*)    Total Protein 8.6 (*)    GFR calc non Af Amer 38 (*)    GFR calc Af Amer 44 (*)    Anion  gap 16 (*)    All other components within normal limits  BLOOD GAS, VENOUS - Abnormal; Notable for the following components:   Bicarbonate 28.8 (*)    Acid-Base Excess 7.1 (*)    All other components within normal limits  MAGNESIUM - Abnormal; Notable for the following components:   Magnesium 2.6 (*)    All other components within normal limits  CBG MONITORING, ED - Abnormal; Notable for the following components:   Glucose-Capillary 300 (*)    All other components within normal limits  CBG MONITORING, ED - Abnormal; Notable for the following components:   Glucose-Capillary 238 (*)    All other components within normal limits  TROPONIN I (  HIGH SENSITIVITY) - Abnormal; Notable for the following components:   Troponin I (High Sensitivity) 36 (*)    All other components within normal limits  TROPONIN I (HIGH SENSITIVITY) - Abnormal; Notable for the following components:   Troponin I (High Sensitivity) 33 (*)    All other components within normal limits  SARS CORONAVIRUS 2 (TAT 6-24 HRS)  LACTIC ACID, PLASMA  LACTIC ACID, PLASMA  HEMOGLOBIN A1C  POC SARS CORONAVIRUS 2 AG -  ED    EKG EKG Interpretation  Date/Time:  Monday June 28 2019 13:13:22 EST Ventricular Rate:  101 PR Interval:    QRS Duration: 88 QT Interval:  396 QTC Calculation: 514 R Axis:   2 Text Interpretation: Sinus tachycardia Abnormal R-wave progression, early transition LVH with secondary repolarization abnormality Prolonged QT interval No significant change was found Confirmed by Ezequiel Essex [phone]) on 06/28/2019 1:18:09 PM   Radiology No results found.  Procedures Procedures (including critical care time)  Medications Ordered in ED Medications  sodium chloride 0.9 % bolus 1,000 mL (has no administration in time range)    ED Course  I have reviewed the triage vital signs and the nursing notes.  Pertinent labs & imaging results that were available during my care of the patient were reviewed by  me and considered in my medical decision making (see chart for details).    MDM Rules/Calculators/A&P                      Patient with dizziness, lightheadedness, dehydration sent from PCPs office with tachycardia and hyperglycemia.  On arrival her heart rate is around 100.  Her blood sugar is 300.  Discharge summary reviewed.  Patient was admitted for suspected cardiogenic syncope versus polypharmacy.  She is wearing a Holter monitor.  She had a negative CT PE study as well as carotid Dopplers and echocardiogram.  Mild orthostasis by BP and hydration given.  Anion gap 16 without evidence of DKA. Mild troponin elevation similar to previous. Hypokalemia.   Patient fairly weak and dizzy with attempted ambulation. She describes the sensation as "vertigo" but denies any room spinning and has no ataxia or nystagmus. She is still symptomatic from her hyperglycemia and orthostasis. She is hesitant to go home and is agreeable for overnight observation. May benefit from MRI [facility] to r/o posterior circulation infarct.   D/w Dr. Waldron Labs. Final Clinical Impression(s) / ED Diagnoses Final diagnoses:  None    Rx / DC Orders ED Discharge Orders    None       Taimi Towe, Annie Main, MD 06/28/19 1734

## 2019-06-28 NOTE — ED Triage Notes (Signed)
Kourtney NP at dr. Juel Burrow office called and reports pt recently d/c'd from [facility] and was in the office today for follow up.  Reports pt very weak, lethargic, diaphoretic, and appearing dehydrated.  Repots cbg at home was 503 and was 330 in the office today.  Reoprts HR 147.  Pt wearing a holter monitor.  NP started IV in left ac and initiated fluid bolus but IV positional.

## 2019-06-28 NOTE — H&P (Addendum)
TRH H&P   Patient Demographics:    [name], is a 64 y.o. female  MRN: [id number]   DOB - Jul 05, 1955  Admit Date - 06/28/2019  Outpatient Primary MD for the patient is [name], MD  Referring MD/NP/PA: Dr Wyvonnia Dusky  Patient coming from: PCP office.  Chief Complaint  Patient presents with  . Hyperglycemia      HPI:    [name]  is a 64 y.o. female,  with medical history significant for hypertension, diabetes, CAD with prior MI, prior history of CVA with residual left-sided weakness, dyslipidemia, anxiety, CKD stage IIIb, and peripheral vascular disease with prior stenting who typically ambulates with a walker, patient with recent hospitalization secondary to syncope, hypokalemia, discharged 2/15 with Holter monitor, patient presents to PCP office today, she was noted to have elevated blood sugar, dehydration, and tachycardia,(ports of heart rate elevated to 140), for some dizziness, lightheadedness, and some nausea, patient had Holter monitor arranged upon recent discharge, she denies any chest pain, shortness of breath, fever, chills, she does still reports dizziness, lightheadedness, and vertigo at baseline. - in ED patient with mild sinus tachycardia heart rate in the upper 90s, she was orthostatic systolic [id number] upon standing, 115 upon sitting, work-up significant for hypokalemia at 2.7 with a creatinine of 1.46, I was called to admit patient for her hypokalemia and orthostasis.    Review of systems:    In addition to the HPI above,  No Fever-chills, does report generalized weakness and lightheadedness No Headache, No changes with Vision or hearing, No problems swallowing food or Liquids, No Chest pain, Cough or Shortness of Breath, No Abdominal pain, No Nausea or Vommitting, Bowel movements are regular, No Blood in stool or Urine, No dysuria, No new skin rashes or  bruises, No new joints pains-aches,  No new weakness, tingling, numbness in any extremity, No recent weight gain or loss, No polyuria, polydypsia or polyphagia, No significant Mental Stressors.  A full 10 point Review of Systems was done, except as stated above, all other Review of Systems were negative.   With Past History of the following :    Past Medical History:  Diagnosis Date  . 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 Anemia    hx  . Angioedema 07/10/2015  . Anxiety   . Arthritis    "neck, left hand" (09/14/2012)  . Asthma   . Cancer ([place]) 1985   ovarian, no treatment except surgery  . Complication of anesthesia    OCCASIONAL TROUBLE TURNING NECK TO RIGHT  . Critical lower limb ischemia    10/2014 s/p L SFA stenting  . Diverticulosis of colon with hemorrhage April 2013  . GERD (gastroesophageal reflux disease)   . H/O hiatal hernia   . Heart attack [facility])    2003 mild MI, March 2013 mild MI  . Hidradenitis    groin  . History of blood  transfusion 1985 AND 2013  . Hyperlipidemia   . Hypertension   . Irritable bowel syndrome   . Left-sided weakness    since stroke, left eye trouble seeing  . Migraines   . Mild CAD    a. Cath 09/2010: mild luminal irregularities of LAD, 30% prox RCA and 20-30% mRCA, EF 65%.  . Neuropathy   . Obesity   . PAD (peripheral artery disease) ([place])    a. critical limb ischemia s/p PTA/stenting of L SFA 10/2014. c. occ prior SFA stent by angio 01/2016, for possible PV bypass.  . Pneumonia    baby  . Recurrent upper respiratory infection (URI)   . S/P arterial stent-mid Lt SFA 11/03/14 11/04/2014  . Schatzki's ring   . Sinus problem   . Stomach ulcer 1972   non-bleeding  . Stroke ([place]) 07-2007, 07-2008, 07-2009   total 3 strokes; mild left sided weakness and left eye "jumps".  . Type II diabetes mellitus ([place])   . Vitamin B 12 deficiency 07-15-2013      Past Surgical History:  Procedure Laterality Date  . ABDOMINAL HYSTERECTOMY  1985  .  ADENOIDECTOMY    . ANTERIOR CERVICAL DECOMP/DISCECTOMY FUSION  2002  . ANTERIOR CERVICAL DECOMP/DISCECTOMY FUSION N/A 04/08/2014   Procedure: Cervical Six-Seven ANTERIOR CERVICAL DECOMPRESSION/DISCECTOMY FUSION Plating and Bonegraft  2 LEVELS;  Surgeon: Ashok Pall, MD;  Location: [place] [facility]URO ORS;  Service: Neurosurgery;  Laterality: N/A;  Cervical Six-Seven ANTERIOR CERVICAL DECOMPRESSION/DISCECTOMY FUSION Plating and Bonegraft  2 LEVELS  . APPENDECTOMY  1985  . AXILLARY HIDRADENITIS EXCISION  1990-2008   bilateral  . BACK SURGERY    . BREAST BIOPSY Right 2007  . BREAST CYST EXCISION Right 2008  . BREAST REDUCTION SURGERY    . CARDIAC CATHETERIZATION  2004   mild disease  . CATARACT EXTRACTION W/PHACO Left 05/16/2015   Procedure: CATARACT EXTRACTION PHACO AND INTRAOCULAR LENS PLACEMENT (IOC);  Surgeon: Rutherford Guys, MD;  Location: [facility];  Service: Ophthalmology;  Laterality: Left;  CDE: 4.24  . CATARACT EXTRACTION W/PHACO Right 05/30/2015   Procedure: CATARACT EXTRACTION RIGHT EYE PHACO AND INTRAOCULAR LENS PLACEMENT ;  Surgeon: Rutherford Guys, MD;  Location: [facility];  Service: Ophthalmology;  Laterality: Right;  CDE:4.08  . CHOLECYSTECTOMY  1990's  . COLONOSCOPY  08/12/2011   Procedure: COLONOSCOPY;  Surgeon: Ladene Artist, MD,FACG;  Location: [facility] ENDOSCOPY;  Service: Endoscopy;  Laterality: N/A;  . COLONOSCOPY  06/19/2006  . cyst thigh Right   . ESOPHAGOGASTRODUODENOSCOPY  08/12/2011   Procedure: ESOPHAGOGASTRODUODENOSCOPY (EGD);  Surgeon: Ladene Artist, MD,FACG;  Location: [facility] ENDOSCOPY;  Service: Endoscopy;  Laterality: N/A;  . ESOPHAGOGASTRODUODENOSCOPY  06/04/2005  . FEMORAL-POPLITEAL BYPASS GRAFT Left 06/19/2016   Procedure: Left Leg BYPASS GRAFT FEMORAL-POPLITEAL ARTERY;  Surgeon: Serafina Mitchell, MD;  Location: [place];  Service: Vascular;  Laterality: Left;  . GIVENS CAPSULE STUDY  08/13/2011   Procedure: GIVENS CAPSULE STUDY;  Surgeon: Ladene Artist, MD,FACG;  Location: [facility] ENDOSCOPY;   Service: Endoscopy;  Laterality: N/A;  . HAMMER TOE SURGERY Bilateral ~ 2000  . HIATAL HERNIA REPAIR    . HYDRADENITIS EXCISION  01/2011; 03/2012   'groin and abdomen; 03/2012" (09/14/2012)  . HYDRADENITIS EXCISION  04/01/2012   Procedure: EXCISION HYDRADENITIS GROIN;  Surgeon: Pedro Earls, MD;  Location: [facility];  Service: General;  Laterality: Bilateral;  Excision of Hydradenitis of Perineum  . HYDRADENITIS EXCISION N/A 09/17/2013   Procedure: EXCISION PERINEAL HIDRADENITIS ;  Surgeon: Pedro Earls, MD;  Location: [facility];  Service: General;  Laterality: N/A;  also in the pubis area  . LEFT HEART CATH AND CORONARY ANGIOGRAPHY N/A 12/26/2016   Procedure: LEFT HEART CATH AND CORONARY ANGIOGRAPHY;  Surgeon: [name], Peter M, MD;  Location: [place] CV LAB;  Service: Cardiovascular;  Laterality: N/A;  . MASS EXCISION Right 09/17/2013   Procedure: EXCISION MASS;  Surgeon: Pedro Earls, MD;  Location: [facility];  Service: General;  Laterality: Right;  . NISSEN FUNDOPLICATION  99991111  . PERIPHERAL VASCULAR CATHETERIZATION N/A 11/03/2014   Procedure: Lower Extremity Angiography;  Surgeon: Lorretta Harp, MD;  Location: [place] CV LAB;  Service: Cardiovascular;  Laterality: N/A;  . PERIPHERAL VASCULAR CATHETERIZATION N/A 01/29/2016   Procedure: Lower Extremity Angiography;  Surgeon: Lorretta Harp, MD;  Location: [place] CV LAB;  Service: Cardiovascular;  Laterality: N/A;  . POSTERIOR LUMBAR FUSION  2008 X 2  . REDUCTION MAMMAPLASTY  1996?  . TONSILLECTOMY AND ADENOIDECTOMY  1959 AND 2000  . UVULOPALATOPHARYNGOPLASTY, TONSILLECTOMY AND SEPTOPLASTY  2000's      Social History:     Social History   Tobacco Use  . Smoking status: Former Smoker    Packs/day: 0.50    Years: 41.00    Pack years: 20.50    Types: Cigarettes    Quit date: 01/15/2016    Years since quitting: 3.4  . Smokeless tobacco: Never Used  . Tobacco comment: Getting ready to start nicotine patches RX by Dr.  Gwenlyn Found per pt.  Substance Use Topics  . Alcohol use: No    Alcohol/week: 0.0 standard drinks        Family History :     Family History  Problem Relation Age of Onset  . Breast cancer Mother   . Heart disease Mother   . Hypertension Mother   . Diabetes Father   . Heart disease Father   . Stroke Father   . Heart disease Sister   . Hypertension Sister   . Hypertension Sister   . Hyperlipidemia Sister   . Diabetes Sister   . Allergic rhinitis Sister   . Emphysema Other        great uncle  . Aneurysm Sister        brain  . Angioedema Neg Hx   . Asthma Neg Hx   . Eczema Neg Hx   . Immunodeficiency Neg Hx   . Urticaria Neg Hx      Home Medications:   Prior to Admission medications   Medication Sig Start Date End Date Taking? Authorizing Provider  albuterol (PROAIR HFA) 108 (90 Base) MCG/ACT inhaler 1-2 inhalations every 4-6 hours as needed for cough or wheeze. Patient taking differently: Inhale 1-2 puffs into the lungs every 4 (four) hours as needed for shortness of breath.  07/10/15  Yes Bobbitt, Sedalia Muta, MD  aspirin EC 81 MG tablet Take 81 mg by mouth daily.   Yes [provider]  Azelastine HCl 0.15 % SOLN Place 2 sprays into both nostrils 2 (two) times daily. Patient taking differently: Place 2 sprays into both nostrils 2 (two) times daily as needed (allergies).  07/10/15  Yes Bobbitt, Sedalia Muta, MD  chlorthalidone (HYGROTON) 25 MG tablet Take 1 tablet (25 mg total) by mouth daily. 06/08/19 09/06/19 Yes Lorretta Harp, MD  citalopram (CELEXA) 10 MG tablet Take 10 mg by mouth daily.   Yes [provider]  cloNIDine (CATAPRES) 0.1 MG tablet Take 0.1 mg by mouth at bedtime. 05/30/19  Yes [provider]  clopidogrel (PLAVIX) 75 MG tablet TAKE 1 TABLET BY MOUTH EVERY DAY WITH SUPPER - NEEDS OFFICE VISIT Patient taking differently: Take 75 mg by mouth daily.  06/14/19  Yes Lorretta Harp, MD  EPINEPHrine 0.3 mg/0.3 mL IJ SOAJ injection Inject  0.3 mLs (0.3 mg total) into the muscle once. 08/02/15  Yes Bobbitt, Sedalia Muta, MD  HYDROcodone-acetaminophen (NORCO) 7.5-325 MG tablet Take 1 tablet by mouth daily as needed for moderate pain.  03/09/19  Yes [provider]  insulin lispro (HUMALOG) 100 UNIT/ML injection Inject 5-12 Units into the skin 2 (two) times daily before a meal. Per sliding scale using OMNIPOD   Yes [provider]  isosorbide mononitrate (IMDUR) 30 MG 24 hr tablet Take 1 tablet (30 mg total) by mouth daily. 01/07/17  Yes Barrett, Evelene Croon, PA-C  losartan (COZAAR) 50 MG tablet Take 1 tablet (50 mg total) by mouth daily. 05/04/19  Yes Lorretta Harp, MD  mupirocin ointment (BACTROBAN) 2 % Apply 1 application topically 2 (two) times daily. 06/02/19  Yes Wurst, [place], PA-C  nitroGLYCERIN (NITROSTAT) 0.4 MG SL tablet PLACE 1 TAB UNDER TONGUE EVERY 5 MINS AS NEEDED FOR CHEST PAIN - MAX 3 DOSES THEN 911 Patient taking differently: Place 0.4 mg under the tongue every 5 (five) minutes as needed for chest pain. MAX 3 DOSES THEN 911 11/30/18  Yes Barrett, Evelene Croon, PA-C  pantoprazole (PROTONIX) 40 MG tablet TAKE 1 TABLET BY MOUTH EVERY DAY 01/07/17  Yes Lorretta Harp, MD  propranolol ER (INDERAL LA) 60 MG 24 hr capsule Take 1 capsule (60 mg total) by mouth every evening. 01/07/17  Yes Barrett, Evelene Croon, PA-C  rosuvastatin (CRESTOR) 40 MG tablet Take 40 mg by mouth every morning.    Yes [provider]  SUMAtriptan (IMITREX) 50 MG tablet Take 50 mg by mouth every 2 (two) hours as needed for migraine.   Yes [provider]  naproxen (NAPROSYN) 375 MG tablet Take 1 tablet (375 mg total) by mouth 2 (two) times daily. Patient not taking: Reported on 06/20/2019 03/26/19   Wurst, [place], PA-C  potassium chloride (KLOR-CON) 10 MEQ tablet Take 1 tablet (10 mEq total) by mouth 2 (two) times daily. Patient not taking: Reported on 06/28/2019 06/21/19 07/21/19  Heath Lark D, DO     Allergies:       Allergies  Allergen Reactions  . Sulfa Antibiotics Shortness Of Breath and Palpitations  . 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 Fish Allergy Hives and Swelling    Tongue swelling  . Iodine Swelling  . Metformin And Related Diarrhea  . [name] [Insulin Glargine-Lixisenatide] Itching and Other (See Comments)    "tongue swelling"  . Adhesive [Tape] Rash    Paper tape is ok  . Shellfish Allergy Swelling and Rash    Tongue swelling     Physical Exam:   Vitals  Blood pressure 114/85, pulse 99, temperature 98.3 F (36.8 C), temperature source Oral, resp. rate 11, height 5\' 4"  (1.626 m), weight 60 kg, SpO2 100 %.   1. General frail female lying in bed in NAD,   2. Normal affect and insight, Not Suicidal or Homicidal, Awake Alert, Oriented X 3.  3. No F.N deficits, ALL C.Nerves Intact, Strength 5/5 all 4 extremities, Sensation intact all 4 extremities, Plantars down going.  4. Ears and Eyes appear Normal, Conjunctivae clear, PERRLA. Moist Oral Mucosa.  5.  Supple Neck, No JVD, No cervical lymphadenopathy appriciated, No Carotid Bruits.  6. Symmetrical Chest wall movement, Good air movement bilaterally, CTAB.  7. RRR, No Gallops, Rubs or Murmurs, No Parasternal Heave.  8. Positive Bowel Sounds, Abdomen Soft, No tenderness, No organomegaly appriciated,No rebound -guarding or rigidity.  9.  No Cyanosis, Normal Skin Turgor, No Skin Rash or Bruise.  10. Good muscle tone,  joints appear normal , no effusions, Normal ROM.    Data Review:    CBC Recent Labs  Lab 06/28/19 1347  WBC 10.4  HGB 15.3*  HCT 46.9*  PLT 284  MCV 86.9  MCH 28.3  MCHC 32.6  RDW 12.7  LYMPHSABS 2.0  MONOABS 0.9  EOSABS 0.1  BASOSABS 0.1   ------------------------------------------------------------------------------------------------------------------  Chemistries  Recent Labs  Lab 06/28/19 1347 06/28/19 1537  NA 134*  --   K 2.7*  --   CL  88*  --   CO2 30  --   GLUCOSE 323*  --   BUN 25*  --   CREATININE 1.46*  --   CALCIUM 10.2  --   MG  --  2.6*  AST 33  --   ALT 35  --   ALKPHOS 119  --   BILITOT 1.1  --    ------------------------------------------------------------------------------------------------------------------ estimated creatinine clearance is 33.6 mL/min (A) (by C-G formula based on SCr of 1.46 mg/dL (H)). ------------------------------------------------------------------------------------------------------------------ No results for input(s): TSH, T4TOTAL, T3FREE, THYROIDAB in the last 72 hours.  Invalid input(s): FREET3  Coagulation profile No results for input(s): INR, PROTIME in the last 168 hours. ------------------------------------------------------------------------------------------------------------------- No results for input(s): DDIMER in the last 72 hours. -------------------------------------------------------------------------------------------------------------------  Cardiac Enzymes No results for input(s): CKMB, TROPONINI, MYOGLOBIN in the last 168 hours.  Invalid input(s): CK ------------------------------------------------------------------------------------------------------------------ No results found for: BNP   ---------------------------------------------------------------------------------------------------------------  Urinalysis    Component Value Date/Time   COLORURINE YELLOW 06/20/2019 1657   APPEARANCEUR HAZY (A) 06/20/2019 1657   LABSPEC 1.018 06/20/2019 1657   PHURINE 6.0 06/20/2019 1657   GLUCOSEU >=500 (A) 06/20/2019 1657   HGBUR SMALL (A) 06/20/2019 1657   BILIRUBINUR NEGATIVE 06/20/2019 1657   KETONESUR NEGATIVE 06/20/2019 1657   PROTEINUR 100 (A) 06/20/2019 1657   UROBILINOGEN 1.0 12/05/2009 1908   NITRITE NEGATIVE 06/20/2019 1657   LEUKOCYTESUR NEGATIVE 06/20/2019 1657     ----------------------------------------------------------------------------------------------------------------   Imaging Results:    No results found.  My personal review of EKG: Rhythm NSR, Rate  101 /min, QTc 514 , no Acute ST changes   Assessment & Plan:    Active Problems:   DM2 (diabetes mellitus, type 2) ([place])   Hyperlipidemia LDL goal <70   Essential hypertension   Diabetic gastroparesis associated with type 2 diabetes mellitus (HCC)   Hypokalemia   Generalized weakness   Hypokalemia -Most likely due to chlorthalidone, will discontinue her chlorthalidone. -We will monitor on telemetry, will replete her potassium and recheck in a.m., magnesium 2.6, no need to replace.  Orthostasis -Continue with gentle IV  hydration, she is on multiple antihypertensive medications, I will stop chlorthalidone.  Continue with Catapres and propranolol for now, continue to hold losartan and Imdur.  Diabetes mellitus -Patient appears to be only on Humalog, not on long acting insulin, currently poorly controlled, will check A1c , she does not appear to be on any long-acting insulin, given history of tongue swelling for glargine , I will try to start on low-dose Metformin to see if she tolerates , will start on insulin sliding scale during  hospital stay .  Prior history of CAD/MI -Continue aspirin/Plavix/beta-blocker and statin  Prolonged QTC -prolonged at 514, likely related to hypokalemia, will replete, monitor on telemetry and recheck EKG in a.m.Marland Kitchen   GERD -Continue PPI  CKD stage IIIb -Baseline, continue to monitor  DVT Prophylaxis   Lovenox  AM Labs Ordered, also please review Full Orders  Family Communication: Admission, patients condition and plan of care including tests being ordered have been discussed with the patient who indicate understanding and agree with the plan and Code Status.  Code Status Full  Likely DC to  Home  Condition GUARDED    Consults called:  None  Admission status: Observation  Time spent in minutes : 55 minutes   Phillips Climes M.D on 06/28/2019 at 3:51 PM  Between 7am to 7pm - Pager - [phone]. After 7pm go to [email or web address] - password [email or web address]  [facility] - Office  [phone]

## 2019-06-28 NOTE — ED Notes (Signed)
Date and time results received: 06/28/19 2:53 PM  (use smartphrase ".now" to insert current time)  Test: K+ Critical Value: 2.7  Name of Provider Notified: Rancour  Orders Received? Or Actions Taken?: Orders Received - See Orders for details

## 2019-06-28 NOTE — Progress Notes (Signed)
Received report from [place] in the ED.  Pt to transfer to [phone]

## 2019-06-29 ENCOUNTER — Ambulatory Visit: Payer: Medicare Other

## 2019-06-29 DIAGNOSIS — K3184 Gastroparesis: Secondary | ICD-10-CM | POA: Diagnosis not present

## 2019-06-29 DIAGNOSIS — E1143 Type 2 diabetes mellitus with diabetic autonomic (poly)neuropathy: Secondary | ICD-10-CM

## 2019-06-29 DIAGNOSIS — E1165 Type 2 diabetes mellitus with hyperglycemia: Secondary | ICD-10-CM | POA: Diagnosis not present

## 2019-06-29 DIAGNOSIS — E785 Hyperlipidemia, unspecified: Secondary | ICD-10-CM

## 2019-06-29 LAB — CBC
HCT: 35.3 % — ABNORMAL LOW (ref 36.0–46.0)
Hemoglobin: 11.3 g/dL — ABNORMAL LOW (ref 12.0–15.0)
MCH: 28.5 pg (ref 26.0–34.0)
MCHC: 32 g/dL (ref 30.0–36.0)
MCV: 89.1 fL (ref 80.0–100.0)
Platelets: 216 10*3/uL (ref 150–400)
RBC: 3.96 MIL/uL (ref 3.87–5.11)
RDW: 41.7 % — ABNORMAL HIGH (ref 11.5–15.5)
WBC: 8.8 10*3/uL (ref 4.0–10.5)
nRBC: 0 % (ref 0.0–0.2)

## 2019-06-29 LAB — BASIC METABOLIC PANEL
Anion gap: 8 (ref 5–15)
BUN: 21 mg/dL (ref 8–23)
CO2: 27 mmol/L (ref 22–32)
Calcium: 8.6 mg/dL — ABNORMAL LOW (ref 8.9–10.3)
Chloride: 100 mmol/L (ref 98–111)
Creatinine, Ser: 1.13 mg/dL — ABNORMAL HIGH (ref 0.44–1.00)
GFR calc Af Amer: 59 mL/min — ABNORMAL LOW (ref >60)
GFR calc non Af Amer: 51 mL/min — ABNORMAL LOW (ref >60)
Glucose, Bld: 202 mg/dL — ABNORMAL HIGH (ref 70–99)
Potassium: 3.6 mmol/L (ref 3.5–5.1)
Sodium: 135 mmol/L (ref 135–145)

## 2019-06-29 LAB — GLUCOSE, CAPILLARY
Glucose-Capillary: 178 mg/dL — ABNORMAL HIGH (ref 70–99)
Glucose-Capillary: 191 mg/dL — ABNORMAL HIGH (ref 70–99)
Glucose-Capillary: 159 mg/dL — ABNORMAL HIGH (ref 70–99)
Glucose-Capillary: 172 mg/dL — ABNORMAL HIGH (ref 70–99)

## 2019-06-29 LAB — HEMOGLOBIN AND HEMATOCRIT, BLOOD
HCT: 33.4 % — ABNORMAL LOW (ref 36.0–46.0)
Hemoglobin: 10.7 g/dL — ABNORMAL LOW (ref 12.0–15.0)

## 2019-06-29 LAB — SARS CORONAVIRUS 2 (TAT 6-24 HRS): SARS Coronavirus 2: NEGATIVE

## 2019-06-29 MED ORDER — SENNOSIDES-DOCUSATE SODIUM 8.6-50 MG PO TABS
2.0000 | ORAL_TABLET | Freq: Every day | ORAL | Status: DC
  Administered 2019-06-29 – 2019-06-30 (×2): 2 via ORAL
  Filled 2019-06-29 (×2): qty 2

## 2019-06-29 MED ORDER — TRAZODONE HCL 50 MG PO TABS
100.0000 mg | ORAL_TABLET | Freq: Every day | ORAL | Status: DC
  Administered 2019-06-29 – 2019-07-01 (×3): 100 mg via ORAL
  Filled 2019-06-29 (×3): qty 2

## 2019-06-29 MED ORDER — CITALOPRAM HYDROBROMIDE 10 MG PO TABS
10.0000 mg | ORAL_TABLET | Freq: Every day | ORAL | Status: DC
  Administered 2019-06-29 – 2019-07-02 (×3): 10 mg via ORAL
  Filled 2019-06-29 (×3): qty 1

## 2019-06-29 MED ORDER — TEMAZEPAM 15 MG PO CAPS: 30.0000 mg | ORAL_CAPSULE | Freq: Every evening | ORAL | Status: DC | PRN

## 2019-06-29 MED ORDER — ALPRAZOLAM 0.5 MG PO TABS
0.5000 mg | ORAL_TABLET | Freq: Two times a day (BID) | ORAL | Status: DC | PRN
  Administered 2019-07-01: 0.5 mg via ORAL
  Filled 2019-06-29: qty 1

## 2019-06-29 MED ORDER — PANTOPRAZOLE SODIUM 40 MG PO TBEC
40.0000 mg | DELAYED_RELEASE_TABLET | Freq: Two times a day (BID) | ORAL | Status: DC
  Administered 2019-06-29 – 2019-06-30 (×2): 40 mg via ORAL
  Filled 2019-06-29 (×2): qty 1

## 2019-06-29 MED ORDER — POTASSIUM CHLORIDE ER 10 MEQ PO TBCR
10.0000 meq | EXTENDED_RELEASE_TABLET | Freq: Two times a day (BID) | ORAL | Status: DC
  Administered 2019-06-29 – 2019-07-02 (×5): 10 meq via ORAL
  Filled 2019-06-29 (×16): qty 1

## 2019-06-29 MED ORDER — LOSARTAN POTASSIUM 50 MG PO TABS
50.0000 mg | ORAL_TABLET | Freq: Every day | ORAL | Status: DC
  Administered 2019-06-29 – 2019-07-02 (×3): 50 mg via ORAL
  Filled 2019-06-29 (×3): qty 1

## 2019-06-29 MED ORDER — POTASSIUM CHLORIDE CRYS ER 10 MEQ PO TBCR
EXTENDED_RELEASE_TABLET | ORAL | Status: AC
  Filled 2019-06-29: qty 1

## 2019-06-29 MED ORDER — POTASSIUM CHLORIDE IN NACL 20-0.9 MEQ/L-% IV SOLN: INTRAVENOUS | Status: DC

## 2019-06-29 MED ORDER — HYDROCODONE-ACETAMINOPHEN 7.5-325 MG PO TABS: 1.0000 | ORAL_TABLET | Freq: Every day | ORAL | Status: DC | PRN

## 2019-06-29 MED ORDER — CHLORTHALIDONE 25 MG PO TABS: 25.0000 mg | ORAL_TABLET | Freq: Every day | ORAL | Status: DC

## 2019-06-29 NOTE — Progress Notes (Signed)
Patient Demographics:    [name], is a 64 y.o. female, DOB - 1955-06-25, [id number]  Admit date - 06/28/2019   Admitting Physician Albertine Patricia, MD  Outpatient Primary MD for the patient is [name], MD  LOS - 0   Chief Complaint  Patient presents with  . Hyperglycemia        Subjective:    [name] today has no fevers, no emesis,  No chest pain,   -Continues to have orthostatic dizziness, dark stools, epigastric discomfort,  Assessment  & Plan :    Active Problems:   DM2 (diabetes mellitus, type 2) (HCC)   Hyperlipidemia LDL goal <70   Essential hypertension   Diabetic gastroparesis associated with type 2 diabetes mellitus ([place])   Hypokalemia   Generalized weakness  Brief summary 64 y.o. female, with medical history significant forhypertension, diabetes, CAD with prior MI, prior history of CVA with residual left-sided weakness, dyslipidemia, anxiety, CKD stage IIIb, and peripheral vascular disease with prior stenting who typically ambulates with a walker, patient with recent hospitalization secondary to syncope, hypokalemia, discharged 2/15 with Holter monitor, patient presents to PCP office today, she was noted to have elevated blood sugar, dehydration, and tachycardia,(heart rate elevated to 140), for some dizziness, lightheadedness, and some nausea, patient had Holter monitor arranged upon recent discharge admitted on 06/28/2019 with orthostatic dizziness and tachycardia and now having black stools with concerns about GI bleed  A/p 1) possible acute blood loss anemia-- hemoglobin down to 11.3 from 15.3 on 06/28/2019 -Patient with dark stools and history of ibuprofen and naproxen use -Give Protonix, check stool occult blood, check serial H&H, -Patient is on aspirin and Plavix for history of PAD and CAD  2) sinus tachycardia and orthostatic dizziness--- suspect due to  ABLA--management as above #1, continue to hydrate  3) hypokalemia--hold chlorthalidone, replace potassium IV and orally  4) hypertension--- chlorthalidone on hold due to orthostatic dizziness and hypokalemia, continue losartan  5)DM2-continue insulin regimen  Disposition/Need for in-Hospital Stay- patient unable to be discharged at this time due to --- orthostatic dizziness and back stools raising concern about symptomatic acute blood loss anemia with orthostatic dizziness and dark stools --- hemoglobin is down 4 g in less than 24 hours, check serial H&H and transfuse as needed -Not medically ready for discharge  Code Status : Full  Family Communication:   NA (patient is alert, awake and coherent)   Consults  :  Gi  DVT Prophylaxis  :  - SCDs /GI bleed  Lab Results  Component Value Date   PLT 216 06/29/2019    Inpatient Medications  Scheduled Meds: . aspirin EC  81 mg Oral Daily  . citalopram  10 mg Oral Daily  . cloNIDine  0.1 mg Oral QHS  . clopidogrel  75 mg Oral Daily  . enoxaparin (LOVENOX) injection  40 mg Subcutaneous Q24H  . insulin aspart  0-15 Units Subcutaneous TID WC  . insulin aspart  0-5 Units Subcutaneous QHS  . losartan  50 mg Oral Daily  . metFORMIN  500 mg Oral BID WC  . pantoprazole  40 mg Oral Daily  . potassium chloride  10 mEq Oral BID  . propranolol ER  60 mg Oral QPM  .  rosuvastatin  40 mg Oral Daily   Continuous Infusions: PRN Meds:.acetaminophen **OR** acetaminophen, ALPRAZolam, HYDROcodone-acetaminophen, SUMAtriptan, temazepam    Anti-infectives (From admission, onward)   None        Objective:   Vitals:   06/28/19 2119 06/29/19 0516 06/29/19 0741 06/29/19 1301  BP: 133/72 113/69  117/62  Pulse: 88 65  65  Resp: 18 17  20   Temp: 98.5 F (36.9 C) 98.1 F (36.7 C)  97.9 F (36.6 C)  TempSrc: Oral Oral  Oral  SpO2: 99% 100% 96% 98%  Weight:      Height:        Wt Readings from Last 3 Encounters:  06/28/19 60 kg  06/20/19  60.9 kg  06/08/19 63.5 kg     Intake/Output Summary (Last 24 hours) at 06/29/2019 1822 Last data filed at 06/29/2019 0900 Gross per 24 hour  Intake 1301.34 ml  Output --  Net 1301.34 ml     Physical Exam  Gen:- Awake Alert, complains of orthostatic dizziness HEENT:- [place].AT, No sclera icterus Neck-Supple Neck,No JVD,.  Lungs-  CTAB , fair symmetrical air movement CV- S1, S2 normal, regular  Abd-  +ve B.Sounds, Abd Soft, mild epigastric discomfort without rebound or guarding    Extremity/Skin:- No  edema, pedal pulses present  Psych-affect is appropriate, oriented x3 Neuro-orthostatic dizziness, no new focal deficits, no tremors   Data Review:   Micro Results Recent Results (from the past 240 hour(s))  Respiratory Panel by RT PCR (Flu A&B, Covid) - Nasopharyngeal Swab     Status: None   Collection Time: 06/20/19  2:40 PM   Specimen: Nasopharyngeal Swab  Result Value Ref Range Status   SARS Coronavirus 2 by RT PCR NEGATIVE NEGATIVE Final    Comment: (NOTE) SARS-CoV-2 target nucleic acids are NOT DETECTED. The SARS-CoV-2 RNA is generally detectable in upper respiratoy specimens during the acute phase of infection. The lowest concentration of SARS-CoV-2 viral copies this assay can detect is 131 copies/mL. A negative result does not preclude SARS-Cov-2 infection and should not be used as the sole basis for treatment or other patient management decisions. A negative result may occur with  improper specimen collection/handling, submission of specimen other than nasopharyngeal swab, presence of viral mutation(s) within the areas targeted by this assay, and inadequate number of viral copies (<131 copies/mL). A negative result must be combined with clinical observations, patient history, and epidemiological information. The expected result is Negative. Fact Sheet for Patients:  PinkCheek.be Fact Sheet for Healthcare Providers:   GravelBags.it This test is not yet ap proved or cleared by the [place] FDA and  has been authorized for detection and/or diagnosis of SARS-CoV-2 by FDA under an Emergency Use Authorization (EUA). This EUA will remain  in effect (meaning this test can be used) for the duration of the COVID-19 declaration under Section 564(b)(1) of the Act, 21 U.S.C. section 360bbb-3(b)(1), unless the authorization is terminated or revoked sooner.    Influenza A by PCR NEGATIVE NEGATIVE Final   Influenza B by PCR NEGATIVE NEGATIVE Final    Comment: (NOTE) The Xpert Xpress SARS-CoV-2/FLU/RSV assay is intended as an aid in  the diagnosis of influenza from Nasopharyngeal swab specimens and  should not be used as a sole basis for treatment. Nasal washings and  aspirates are unacceptable for Xpert Xpress SARS-CoV-2/FLU/RSV  testing. Fact Sheet for Patients: PinkCheek.be Fact Sheet for Healthcare Providers: GravelBags.it This test is not yet approved or cleared by the [place] FDA and  has  been authorized for detection and/or diagnosis of SARS-CoV-2 by  FDA under an Emergency Use Authorization (EUA). This EUA will remain  in effect (meaning this test can be used) for the duration of the  Covid-19 declaration under Section 564(b)(1) of the Act, 21  U.S.C. section 360bbb-3(b)(1), unless the authorization is  terminated or revoked. Performed at [facility], [address] [place], [place] [address]   SARS CORONAVIRUS 2 (TAT 6-24 HRS) Nasopharyngeal Nasopharyngeal Swab     Status: None   Collection Time: 06/28/19  5:43 PM   Specimen: Nasopharyngeal Swab  Result Value Ref Range Status   SARS Coronavirus 2 NEGATIVE NEGATIVE Final    Comment: (NOTE) SARS-CoV-2 target nucleic acids are NOT DETECTED. The SARS-CoV-2 RNA is generally detectable in upper and lower respiratory specimens during the acute phase of  infection. Negative results do not preclude SARS-CoV-2 infection, do not rule out co-infections with other pathogens, and should not be used as the sole basis for treatment or other patient management decisions. Negative results must be combined with clinical observations, patient history, and epidemiological information. The expected result is Negative. Fact Sheet for Patients: SugarRoll.be Fact Sheet for Healthcare Providers: https://www.woods-mathews.com/ This test is not yet approved or cleared by the [place] FDA and  has been authorized for detection and/or diagnosis of SARS-CoV-2 by FDA under an Emergency Use Authorization (EUA). This EUA will remain  in effect (meaning this test can be used) for the duration of the COVID-19 declaration under Section 56 4(b)(1) of the Act, 21 U.S.C. section 360bbb-3(b)(1), unless the authorization is terminated or revoked sooner. Performed at [facility], [place] [address], [place], [place] [address]     Radiology Reports DG Chest 1 View  Result Date: 06/20/2019 CLINICAL DATA:  Status post fall with dizziness. EXAM: CHEST  1 VIEW COMPARISON:  March 26, 2019 FINDINGS: The heart size and mediastinal contours are within normal limits. Both lungs are clear. The visualized skeletal structures are unremarkable. IMPRESSION: No active disease. Electronically Signed   By: Abelardo Diesel M.D.   On: 06/20/2019 15:07   CT Head Wo Contrast  Result Date: 06/20/2019 CLINICAL DATA:  Poly trauma. Multiple falls. EXAM: CT HEAD WITHOUT CONTRAST CT CERVICAL SPINE WITHOUT CONTRAST TECHNIQUE: Multidetector CT imaging of the head and cervical spine was performed following the standard protocol without intravenous contrast. Multiplanar CT image reconstructions of the cervical spine were also generated. COMPARISON:  None. FINDINGS: CT HEAD FINDINGS Brain: Generalized parenchymal volume loss with commensurate dilatation  of the ventricles and sulci. No mass, hemorrhage, edema or other evidence of acute parenchymal abnormality. No extra-axial hemorrhage. Vascular: Chronic calcified atherosclerotic changes of the large vessels at the skull base. No unexpected hyperdense vessel. Skull: Normal. Negative for fracture or focal lesion. Sinuses/Orbits: No acute finding. Other: None. CT CERVICAL SPINE FINDINGS Alignment: Normal alignment. No evidence of acute vertebral body subluxation. Skull base and vertebrae: No fracture line or displaced fracture fragment seen. Facet joints are normally aligned throughout. Soft tissues and spinal canal: No prevertebral fluid or swelling. No visible canal hematoma. Disc levels: C2-3 through C4-5 disc spaces are well preserved. Surgical fusion at C5 through C7. No more than mild central canal stenosis at any level. Upper chest: Negative. Other: Bilateral carotid atherosclerosis. Anterior cervical fusion hardware appears intact and appropriately positioned at the C5 through C7 vertebral body levels. IMPRESSION: 1. No acute intracranial abnormality. No intracranial mass, hemorrhage or edema. No skull fracture. 2. No fracture or acute subluxation within the  cervical spine. Surgical fusion hardware at C5 through C7 appears intact and appropriately positioned. 3. Carotid atherosclerosis. Electronically Signed   By: Franki Cabot M.D.   On: 06/20/2019 15:12   CT ANGIO CHEST PE W OR WO CONTRAST  Result Date: 06/20/2019 CLINICAL DATA:  Syncope. Elevated D-dimer. EXAM: CT ANGIOGRAPHY CHEST WITH CONTRAST TECHNIQUE: Multidetector CT imaging of the chest was performed using the standard protocol during bolus administration of intravenous contrast. Multiplanar CT image reconstructions and MIPs were obtained to evaluate the vascular anatomy. CONTRAST:  5mL OMNIPAQUE IOHEXOL 350 MG/ML SOLN COMPARISON:  None. FINDINGS: Cardiovascular: Contrast injection is sufficient to demonstrate satisfactory opacification of the  pulmonary arteries to the segmental level. There is no pulmonary embolus. The main pulmonary artery is within normal limits for size. There is no CT evidence of acute right heart strain. There are atherosclerotic changes of the visualized thoracic aorta. There is no evidence for a thoracic aortic aneurysm. Heart size is normal, without pericardial effusion. Mediastinum/Nodes: --No mediastinal or hilar lymphadenopathy. --No axillary lymphadenopathy. --No supraclavicular lymphadenopathy. --Normal thyroid gland. --there is a small hiatal hernia. There are surgical clips near the GE junction. Lungs/Pleura: Emphysematous changes are noted. There is no pneumothorax or pleural effusion. There is no infiltrate. Upper Abdomen: No acute abnormality. Musculoskeletal: No chest wall abnormality. No acute or significant osseous findings. Review of the MIP images confirms the above findings. IMPRESSION: 1. No evidence for acute pulmonary embolism. 2. Emphysematous changes in the lungs. 3. Small hiatal hernia. 4. Aortic Atherosclerosis (ICD10-I70.0) and Emphysema (ICD10-J43.9). Electronically Signed   By: Constance Holster M.D.   On: 06/20/2019 19:53   CT Cervical Spine Wo Contrast  Result Date: 06/20/2019 CLINICAL DATA:  Poly trauma. Multiple falls. EXAM: CT HEAD WITHOUT CONTRAST CT CERVICAL SPINE WITHOUT CONTRAST TECHNIQUE: Multidetector CT imaging of the head and cervical spine was performed following the standard protocol without intravenous contrast. Multiplanar CT image reconstructions of the cervical spine were also generated. COMPARISON:  None. FINDINGS: CT HEAD FINDINGS Brain: Generalized parenchymal volume loss with commensurate dilatation of the ventricles and sulci. No mass, hemorrhage, edema or other evidence of acute parenchymal abnormality. No extra-axial hemorrhage. Vascular: Chronic calcified atherosclerotic changes of the large vessels at the skull base. No unexpected hyperdense vessel. Skull: Normal.  Negative for fracture or focal lesion. Sinuses/Orbits: No acute finding. Other: None. CT CERVICAL SPINE FINDINGS Alignment: Normal alignment. No evidence of acute vertebral body subluxation. Skull base and vertebrae: No fracture line or displaced fracture fragment seen. Facet joints are normally aligned throughout. Soft tissues and spinal canal: No prevertebral fluid or swelling. No visible canal hematoma. Disc levels: C2-3 through C4-5 disc spaces are well preserved. Surgical fusion at C5 through C7. No more than mild central canal stenosis at any level. Upper chest: Negative. Other: Bilateral carotid atherosclerosis. Anterior cervical fusion hardware appears intact and appropriately positioned at the C5 through C7 vertebral body levels. IMPRESSION: 1. No acute intracranial abnormality. No intracranial mass, hemorrhage or edema. No skull fracture. 2. No fracture or acute subluxation within the cervical spine. Surgical fusion hardware at C5 through C7 appears intact and appropriately positioned. 3. Carotid atherosclerosis. Electronically Signed   By: Franki Cabot M.D.   On: 06/20/2019 15:12   US Carotid Bilateral  Result Date: 06/21/2019 CLINICAL DATA:  Stroke symptoms, hypertension, syncope, visual disturbance and diabetes EXAM: BILATERAL CAROTID DUPLEX ULTRASOUND TECHNIQUE: Pearline Cables scale imaging, color Doppler and duplex ultrasound were performed of bilateral carotid and vertebral arteries in the neck. COMPARISON:  None.  FINDINGS: Criteria: Quantification of carotid stenosis is based on velocity parameters that correlate the residual internal carotid diameter with NASCET-based stenosis levels, using the diameter of the distal internal carotid lumen as the denominator for stenosis measurement. The following velocity measurements were obtained: RIGHT ICA: 111/34 cm/sec CCA: 123XX123 cm/sec SYSTOLIC ICA/CCA RATIO:  0.9 ECA: 101 cm/sec LEFT ICA: 119/33 cm/sec CCA: 0000000 cm/sec SYSTOLIC ICA/CCA RATIO:  [id number] ECA: 157  cm/sec RIGHT CAROTID ARTERY: Minor echogenic shadowing plaque formation. No hemodynamically significant right ICA stenosis, velocity elevation, or turbulent flow. Degree of narrowing less than 50%. RIGHT VERTEBRAL ARTERY:  Antegrade LEFT CAROTID ARTERY: Similar scattered minor echogenic plaque formation. No hemodynamically significant left ICA stenosis, velocity elevation, or turbulent flow. LEFT VERTEBRAL ARTERY:  Antegrade IMPRESSION: Minor carotid atherosclerosis. No hemodynamically significant ICA stenosis. Degree of narrowing estimated at less than 50% bilaterally by ultrasound criteria. Patent antegrade vertebral flow bilaterally Electronically Signed   By: Jerilynn Mages.  Shick M.D.   On: 06/21/2019 14:26   DG Knee Complete 4 Views Left  Result Date: 06/20/2019 CLINICAL DATA:  Status post fall with left knee pain. EXAM: LEFT KNEE - COMPLETE 4+ VIEW COMPARISON:  None. FINDINGS: No evidence of fracture, dislocation, or joint effusion. Bipartite patella is noted. Soft tissues are unremarkable. IMPRESSION: No acute fracture or dislocation. Electronically Signed   By: Abelardo Diesel M.D.   On: 06/20/2019 15:05   ECHOCARDIOGRAM COMPLETE  Result Date: 06/21/2019    ECHOCARDIOGRAM REPORT   Patient Name:   DOLL MISE Date of Exam: 06/21/2019 Medical Rec #:  [id number]       Height:       64.0 in Accession #:    [id number]      Weight:       134.3 lb Date of Birth:  May 18, 1955       BSA:          1.65 m Patient Age:    16 years        BP:           129/88 mmHg Patient Gender: F               HR:           94 bpm. Exam Location:  [name] Procedure: 2D Echo Indications:    Syncope  History:        Patient has prior history of Echocardiogram examinations, most                 recent 09/15/2012. CAD, COPD; Risk Factors:Hypertension, Diabetes                 and Dyslipidemia.  Sonographer:    Leavy Cella RDCS (AE) Referring Phys: [phone] Royanne Foots [place]  1. Left ventricular ejection fraction, by estimation, is  65 to 70%. The left ventricle has hyperdynamic function. The left ventricle has no regional wall motion abnormalities. There is mild concentric left ventricular hypertrophy. Left ventricular diastolic parameters are consistent with Grade I diastolic dysfunction (impaired relaxation).  2. Right ventricular systolic function is normal. The right ventricular size is normal.  3. The mitral valve is grossly normal. No evidence of mitral valve regurgitation.  4. The aortic valve is tricuspid. Aortic valve regurgitation is not visualized. No aortic stenosis is present.  5. The inferior vena cava is normal in size with greater than 50% respiratory variability, suggesting right atrial pressure of 3 mmHg. FINDINGS  Left Ventricle: Left ventricular ejection fraction, by estimation, is 65  to 70%. The left ventricle has hyperdynamic function. The left ventricle has no regional wall motion abnormalities. There is mild concentric left ventricular hypertrophy. Left ventricular diastolic parameters are consistent with Grade I diastolic dysfunction (impaired relaxation). Indeterminate filling pressures. Right Ventricle: The right ventricular size is normal. No increase in right ventricular wall thickness. Right ventricular systolic function is normal. Left Atrium: Left atrial size was normal in size. Right Atrium: Right atrial size was normal in size. Pericardium: There is no evidence of pericardial effusion. Mitral Valve: The mitral valve is grossly normal. No evidence of mitral valve regurgitation. Tricuspid Valve: The tricuspid valve is grossly normal. Tricuspid valve regurgitation is not demonstrated. Aortic Valve: The aortic valve is tricuspid. Aortic valve regurgitation is not visualized. No aortic stenosis is present. Pulmonic Valve: The pulmonic valve was not well visualized. Pulmonic valve regurgitation is not visualized. Aorta: The aortic root is normal in size and structure. Venous: The inferior vena cava is normal in size  with greater than 50% respiratory variability, suggesting right atrial pressure of 3 mmHg. IAS/Shunts: No atrial level shunt detected by color flow Doppler.  LEFT VENTRICLE PLAX 2D LVIDd:         3.64 cm  Diastology LVIDs:         1.72 cm  LV e' lateral:   11.00 cm/s LV PW:         1.25 cm  LV E/e' lateral: 4.7 LV IVS:        1.13 cm  LV e' medial:    3.81 cm/s LVOT diam:     2.00 cm  LV E/e' medial:  13.6 LV SV Index:   28.39 LVOT Area:     3.14 cm  RIGHT VENTRICLE RV S prime:     9.68 cm/s TAPSE (M-mode): 1.6 cm LEFT ATRIUM             Index       RIGHT ATRIUM          Index LA diam:        2.60 cm 1.57 cm/m  RA Area:     8.25 cm LA Vol (A2C):   27.4 ml 16.59 ml/m RA Volume:   15.70 ml 9.51 ml/m LA Vol (A4C):   26.6 ml 16.11 ml/m LA Biplane Vol: 29.0 ml 17.56 ml/m   AORTA Ao Root diam: 2.60 cm MITRAL VALVE MV Area (PHT): 2.69 cm    SHUNTS MV Decel Time: 282 msec    Systemic Diam: 2.00 cm MV E velocity: 51.90 cm/s MV A velocity: 79.50 cm/s MV E/A ratio:  0.65 Kate Sable MD Electronically signed by Kate Sable MD Signature Date/Time: 06/21/2019/12:26:50 PM    Final    DG Hips Bilat W or Wo Pelvis 3-4 Views  Result Date: 06/20/2019 CLINICAL DATA:  Status post fall with hip pain. EXAM: DG HIP (WITH OR WITHOUT PELVIS) 3-4V BILAT COMPARISON:  None. FINDINGS: There is no evidence of hip fracture or dislocation. Calcifications are identified over the soft tissues of right hip. IMPRESSION: No acute fracture or dislocation. Electronically Signed   By: Abelardo Diesel M.D.   On: 06/20/2019 15:07     CBC Recent Labs  Lab 06/28/19 1347 06/29/19 0511 06/29/19 1257  WBC 10.4 8.8  --   HGB 15.3* 11.3* 10.7*  HCT 46.9* 35.3* 33.4*  PLT 284 216  --   MCV 86.9 89.1  --   MCH 28.3 28.5  --   MCHC 32.6 32.0  --   RDW 12.7  41.7*  --   LYMPHSABS 2.0  --   --   MONOABS 0.9  --   --   EOSABS 0.1  --   --   BASOSABS 0.1  --   --     Chemistries  Recent Labs  Lab 06/28/19 1347 06/28/19 1537  06/29/19 0511  NA 134*  --  135  K 2.7*  --  3.6  CL 88*  --  100  CO2 30  --  27  GLUCOSE 323*  --  202*  BUN 25*  --  21  CREATININE 1.46*  --  1.13*  CALCIUM 10.2  --  8.6*  MG  --  2.6*  --   AST 33  --   --   ALT 35  --   --   ALKPHOS 119  --   --   BILITOT 1.1  --   --    ------------------------------------------------------------------------------------------------------------------ No results for input(s): CHOL, HDL, LDLCALC, TRIG, CHOLHDL, LDLDIRECT in the last 72 hours.  Lab Results  Component Value Date   HGBA1C 9.1 (H) 06/28/2019   ------------------------------------------------------------------------------------------------------------------ No results for input(s): TSH, T4TOTAL, T3FREE, THYROIDAB in the last 72 hours.  Invalid input(s): FREET3 ------------------------------------------------------------------------------------------------------------------ No results for input(s): VITAMINB12, FOLATE, FERRITIN, TIBC, IRON, RETICCTPCT in the last 72 hours.  Coagulation profile No results for input(s): INR, PROTIME in the last 168 hours.  No results for input(s): DDIMER in the last 72 hours.  Cardiac Enzymes No results for input(s): CKMB, TROPONINI, MYOGLOBIN in the last 168 hours.  Invalid input(s): CK ------------------------------------------------------------------------------------------------------------------ No results found for: BNP   Roxan Hockey M.D on 06/29/2019 at 6:22 PM  Go to [email or web address] - for contact info  [facility] - Office  [phone]

## 2019-06-29 NOTE — Plan of Care (Signed)
  Problem: Acute Rehab PT Goals(only PT should resolve) Goal: Pt Will Go Supine/Side To Sit Outcome: Progressing Flowsheets (Taken 06/29/2019 1038) Pt will go Supine/Side to Sit: Independently Goal: Patient Will Transfer Sit To/From Stand Outcome: Progressing Flowsheets (Taken 06/29/2019 1038) Patient will transfer sit to/from stand: with modified independence Goal: Pt Will Transfer Bed To Chair/Chair To Bed Outcome: Progressing Flowsheets (Taken 06/29/2019 1038) Pt will Transfer Bed to Chair/Chair to Bed: with modified independence Goal: Pt Will Ambulate Outcome: Progressing Flowsheets (Taken 06/29/2019 1038) Pt will Ambulate:  100 feet  with modified independence  with rolling walker  with cane   10:38 AM, 06/29/19 Lonell Grandchild, MPT Physical Therapist with [facility] [phone] office [phone] mobile phone

## 2019-06-29 NOTE — TOC Initial Note (Signed)
Transition of Care [facility]) - Initial/Assessment Note    Patient Details  Name: [name] MRN: [id number] Date of Birth: 04-28-1956  [facility] CM/SW Contact:    Boneta Lucks, RN Phone Number: 06/29/2019, 3:11 PM  Clinical Narrative:    Patient admitted with diabetic gastroparesis. Patient lives home alone.  PT is recommending Home health PT.  Patient had been referred to [facility] in the the past. Did not start PT due to migraine headache.  [facility] will accept patients insurance today.  Patient is wanting [facility]. Vaughan Basta took the referral, TOC to follow.           Expected Discharge Plan: [facility] Barriers to Discharge: Continued Medical Work up   Patient Goals and CMS Choice Patient states their goals for this hospitalization and ongoing recovery are:: to go home. CMS Medicare.gov Compare Post Acute Care list provided to:: Patient Choice offered to / list presented to : Patient  Expected Discharge Plan and Services Expected Discharge Plan: [place]: PT [facility] Agency: [facility] ([place]) Date [facility] Agency Contacted: 06/29/19 Time [place] Agency Contacted: 67 Representative spoke with at [place]: [place] Arrangements/Services   Lives with:: Self   Do you feel safe going back to the place where you live?: Yes      Need for Family Participation in Patient Care: Yes (Comment) Care giver support system in place?: Yes (comment)   Criminal Activity/Legal Involvement Pertinent to Current Situation/Hospitalization: No - Comment as needed  Activities of Daily Living Home Assistive Devices/Equipment: Cane (specify quad or straight), Walker (specify type), Blood pressure cuff, Eyeglasses, CBG Meter ADL Screening (condition at time of admission) Patient's cognitive ability adequate to safely complete daily activities?: Yes Is the patient deaf or have difficulty hearing?: No Does the  patient have difficulty seeing, even when wearing glasses/contacts?: No Does the patient have difficulty concentrating, remembering, or making decisions?: No Patient able to express need for assistance with ADLs?: Yes Does the patient have difficulty dressing or bathing?: No Independently performs ADLs?: Yes (appropriate for developmental age) Does the patient have difficulty walking or climbing stairs?: No Weakness of Legs: None Weakness of Arms/Hands: None  Permission Sought/Granted     Emotional Assessment    Orientation: : Oriented to Self, Oriented to Place, Oriented to  Time, Oriented to Situation Alcohol / Substance Use: Not Applicable Psych Involvement: No (comment)  Admission diagnosis:  Hypokalemia [E87.6] Dizziness [R42] Hyperglycemia [R73.9] Near syncope [R55] Patient Active Problem List   Diagnosis Date Noted  . Hypokalemia 06/28/2019  . Generalized weakness 06/28/2019  . Syncope and collapse 06/20/2019  . Tobacco abuse 05/04/2019  . Diabetic gastroparesis associated with type 2 diabetes mellitus ([place]) 11/25/2017  . Precordial chest pain 12/24/2016  . Nausea & vomiting 12/24/2016  . PAD (peripheral artery disease) ([place]) 06/19/2016  . Diabetic neuropathy ([place]) 01/31/2016  . Obesity 01/31/2016  . Mild CAD   . Claudication ([place]) 01/29/2016  . Food allergy 08/02/2015  . Recurrent urticaria 07/10/2015  . Angioedema 07/10/2015  . Perennial allergic rhinitis with a possible non-allergic component 07/10/2015  . History of asthma 07/10/2015  . S/P femoral-popliteal bypass surgery 11/04/2014  . Critical lower limb ischemia 11/03/2014  . Peripheral arterial disease ([place]) 11/02/2014  . HNP (herniated nucleus pulposus) with myelopathy, cervical 04/11/2014  . HNP (herniated nucleus pulposus), cervical 04/08/2014  . Pancreatitis 01/02/2013  . Abdominal pain, epigastric 12/14/2012  .  IBS (irritable bowel syndrome) 09/12/2011  . GERD (gastroesophageal reflux disease)  09/12/2011  . Diverticulosis of colon with hemorrhage 08/10/2011  . Chest pain at rest 07/06/2011    Class: Acute  . Hidradenitis, left pubic area and left lower quadrant 01/18/2011  . DM2 (diabetes mellitus, type 2) ([place]) 08/24/2007  . Hyperlipidemia LDL goal <70 08/24/2007  . Essential hypertension 08/24/2007   PCP:  [name], MD Pharmacy:   [id number] [place] [place] [address] Phone: [phone] Fax: [phone]  [facility] [phone] - [place], [place] AT [place] [id number] [place] [address] Phone: [phone] Fax: [phone]

## 2019-06-29 NOTE — Progress Notes (Signed)
Initial Nutrition Assessment  DOCUMENTATION CODES:      INTERVENTION:  Education completed    NUTRITION DIAGNOSIS:   Food and nutrition related knowledge deficit related to (poorly controlled diabetes-) as evidenced by (pt consumes regular diet and A1C-9.1%).   GOAL:  (Patient will adopt a CHO modified diet at home and maintain average  glucose <200 mg/dl)   MONITOR: po intake, CBG's    REASON FOR ASSESSMENT:   Consult Diet education  ASSESSMENT:  Patient is a 64 yo female with hx of Stroke, CAD, DM-2, CKD-3, PVD, GERD, Hypertension and Vitamin B-12 deficiency. Discharged on 2/15- admitted with syncope.   Patient presents from Dr Juel Burrow office with hyperglycemia, weakness and dehydration. A1C-9.1% with average glucose 214 mg/dl.   Patient endorses regular diet at home. Affirms that she is ready to make adjustments in her dietary intake in order to improve her disease management and decrease risk of further negative health impact.    Patient provided "Carbohydrate Counting for People with Diabetes" handout from the Academy of Nutrition and Dietetics. Discussed different food groups and their effects on blood sugar, emphasizing carbohydrate-containing foods. Provided list of carbohydrates and recommended serving sizes of common foods.  Discussed importance of controlled and consistent carbohydrate intake throughout the day. Provided examples of ways to balance meals/snacks and encouraged intake of high-fiber, whole grain complex carbohydrates.   Weight history reflects acute loss since December from 65 kg to current 60 kg (~8%) x 2 months. Usual weight had been ranging between 64-68 kg the past 2+ years.  Diet Order:   Diet Order            Diet heart healthy/carb modified Room service appropriate? Yes; Fluid consistency: Thin  Diet effective now              EDUCATION NEEDS:   Education needs have been addressed Skin:  Skin Assessment: Reviewed RN Assessment  Last  BM:  unknown  Height:   Ht Readings from Last 1 Encounters:  06/28/19 5\' 4"  (1.626 m)    Weight:   Wt Readings from Last 1 Encounters:  06/28/19 60 kg    Ideal Body Weight:   55 kg  BMI:  Body mass index is 22.71 kg/m.  Estimated Nutritional Needs:   Kcal:  1500-1600  Protein:  48-52 gr  Fluid:  1.5-1.6 liters daily  Colman Cater  Clinical Nutrition [id number]

## 2019-06-29 NOTE — Progress Notes (Signed)
Inpatient Diabetes Program Recommendations  AACE/ADA: New Consensus Statement on Inpatient Glycemic Control  Target Ranges:  Prepandial:   less than 140 mg/dL      Peak postprandial:   less than 180 mg/dL (1-2 hours)      Critically ill patients:  140 - 180 mg/dL   Results for [name], [name] (MRN [id number]) as of 06/29/2019 07:54  Ref. Range 06/28/2019 12:41 06/28/2019 16:47 06/28/2019 21:28 06/29/2019 07:53  Glucose-Capillary Latest Ref Range: 70 - 99 mg/dL 300 (H) 238 (H) 263 (H) 178 (H)  Results for [name], [name] (MRN [id number]) as of 06/29/2019 07:54  Ref. Range 06/20/2019 15:15 06/28/2019 13:47  Hemoglobin A1C Latest Ref Range: 4.8 - 5.6 % 8.6 (H) 9.1 (H)   Review of Glycemic Control  Diabetes history: DM2 Outpatient Diabetes medications: OnmiPod with Humalog Current orders for Inpatient glycemic control: Novolog 0-15 units TID with meals, Novolog 0-5 units QHS  Inpatient Diabetes Program Recommendations:   Insulin-Basal:  Please consider ordering Levemir 5 units daily.  A1C: A1C 9.1% on 06/28/19 indicating an average glucose of 214 mg/dl over the past 2-3 months.  NOTE: Spoke with patient over the phone about diabetes and home regimen for diabetes control. Patient reports being followed by PCP for diabetes management and she uses an OmniPod with Humalog insulin as an outpatient for diabetes control. Patient states that she removed the insulin pump on Sunday because it was time to change it and since she was going to the doctor on Monday and going to be changed to SQ basal and bolus injections she did not restart her insulin pump. In talking further with patient she notes that she is out of Humalog and needs to order pump supplies if she was going to continue to use an insulin pump. However, patient states that due to how high her glucose has been running, she would prefer not to use the insulin pump and go back to basal and bolus insulin injections with insulin pens. Patient reports  that the OmniPod rep has not been coming to the PCP office to help her make adjustments with the insulin pump since COVID started and she feels that going back to SQ insulin injections with insulin pens would be best right now as she is concerned about how high glucose is running. Patient states that she would like to talk with RD regarding further diet education and meal planning ideas. Ordered RD consult to talk with patient while inpatient.  Patient notes that she has lost 20 pounds since December and she is concerned about recent weight loss. Patient use to take Trulicity and Toujeo in the past and she last used Trulicity about 1 year ago. Patient does not want to use Trulicity anymore because she does not want to lose anymore weight.   Patient states that when she gets discharged she would like to get a prescription for basal and bolus insulin pens along with how much to take. She will then plan to follow up with PCP regarding DM management. Patient verbalized understanding of information discussed and reports no further questions at this time related to diabetes.  Noted allergy to [facility] which has glargine. Patient has taken Toujeo which is glargine in the past and she did not have any issues with taking it.  At time of discharge, please provide Rx for: basal (Lantus or Levemir) and bolus (Humalog) insulin pens and pen needles.   Thanks, Barnie Alderman, RN, MSN, CDE Diabetes Coordinator Inpatient Diabetes Program [phone] (Team Pager from  8am to 5pm)

## 2019-06-29 NOTE — Care Management Obs Status (Signed)
[place] NOTIFICATION   Patient Details  Name: [name] MRN: [id number] Date of Birth: 1955/07/21   Medicare Observation Status Notification Given:  Yes    Tommy Medal 06/29/2019, 3:45 PM

## 2019-06-29 NOTE — Evaluation (Signed)
Physical Therapy Evaluation Patient Details Name: [name] MRN: [id number] DOB: 1955-07-04 Today's Date: 06/29/2019   History of Present Illness  [name]  is a 64 y.o. female,  with medical history significant for hypertension, diabetes, CAD with prior MI, prior history of CVA with residual left-sided weakness, dyslipidemia, anxiety, CKD stage IIIb, and peripheral vascular disease with prior stenting who typically ambulates with a walker, patient with recent hospitalization secondary to syncope, hypokalemia, discharged 2/15 with Holter monitor, patient presents to PCP office today, she was noted to have elevated blood sugar, dehydration, and tachycardia,(ports of heart rate elevated to 140), for some dizziness, lightheadedness, and some nausea, patient had Holter monitor arranged upon recent discharge, she denies any chest pain, shortness of breath, fever, chills, she does still reports dizziness, lightheadedness, and vertigo at baseline.- in ED patient with mild sinus tachycardia heart rate in the upper 90s, she was orthostatic systolic [id number] upon standing, 115 upon sitting, work-up significant for hypokalemia at 2.7 with a creatinine of 1.46, I was called to admit patient for her hypokalemia and orthostasis.    Clinical Impression  Patient functioning near baseline for functional mobility and gait, demonstrates slightly labored cadence in room/hallway using RW without loss of balance, limited secondary to fatigue, can ambulate short distances safely without use of AD in room and left sitting on commode in bathroom to have a bowel movement after therapy - nursing staff notified.  Patient will benefit from continued physical therapy in hospital and recommended venue below to increase strength, balance, endurance for safe ADLs and gait.     Follow Up Recommendations Home health PT;Supervision - Intermittent    Equipment Recommendations  None recommended by PT    Recommendations for Other  Services       Precautions / Restrictions Precautions Precautions: Fall Restrictions Weight Bearing Restrictions: No      Mobility  Bed Mobility Overal bed mobility: Modified Independent             General bed mobility comments: slightly increased time  Transfers Overall transfer level: Needs assistance Equipment used: Rolling walker (2 wheeled);None Transfers: Sit to/from [facility] Sit to Stand: Supervision Stand pivot transfers: Supervision       General transfer comment: increased time, slightly labored movement, can ambulate safely for short distances in room  Ambulation/Gait Ambulation/Gait assistance: Supervision Gait Distance (Feet): 75 Feet Assistive device: Rolling walker (2 wheeled) Gait Pattern/deviations: Decreased step length - right;Decreased step length - left;Decreased stride length Gait velocity: decreased   General Gait Details: slow slightly labored cadence without loss of balance, limited secondary to c/o fatigue  Stairs            Wheelchair Mobility    Modified Rankin (Stroke Patients Only)       Balance Overall balance assessment: Needs assistance Sitting-balance support: Feet supported;No upper extremity supported Sitting balance-Leahy Scale: Good Sitting balance - Comments: seated at EOB   Standing balance support: During functional activity;No upper extremity supported Standing balance-Leahy Scale: Fair Standing balance comment: fair/good using RW                             Pertinent Vitals/Pain Pain Assessment: 0-10 Pain Score: 8  Pain Location: headache, right side of jaw Pain Descriptors / Indicators: Aching Pain Intervention(s): Limited activity within patient's tolerance;Monitored during session    [place] expects to be discharged to:: Private residence Living Arrangements: Alone Available Help at Discharge:  Family;Friend(s);Available PRN/intermittently Type  of Home: Apartment Home Access: Level entry     Home Layout: One level Home Equipment: Kasandra Knudsen - quad;Walker - 2 wheels;Bedside commode;Shower seat      Prior Function Level of Independence: Independent         Comments: Hydrographic surveyor, drives, using RW recently due to falls/vertigo     Hand Dominance   Dominant Hand: Right    Extremity/Trunk Assessment   Upper Extremity Assessment Upper Extremity Assessment: Overall WFL for tasks assessed    Lower Extremity Assessment Lower Extremity Assessment: Generalized weakness    Cervical / Trunk Assessment Cervical / Trunk Assessment: Normal  Communication   Communication: No difficulties  Cognition Arousal/Alertness: Awake/alert Behavior During Therapy: WFL for tasks assessed/performed Overall Cognitive Status: Within Functional Limits for tasks assessed                                        General Comments      Exercises     Assessment/Plan    PT Assessment Patient needs continued PT services  PT Problem List Decreased strength;Decreased activity tolerance;Decreased balance;Decreased mobility       PT Treatment Interventions Balance training;Gait training;Stair training;Functional mobility training;Therapeutic activities;Therapeutic exercise;Patient/family education    PT Goals (Current goals can be found in the Care Plan section)  Acute Rehab PT Goals Patient Stated Goal: return home with family/friends to assist PT Goal Formulation: With patient Time For Goal Achievement: 07/02/19 Potential to Achieve Goals: Good    Frequency Min 3X/week   Barriers to discharge        Co-evaluation               AM-PAC PT "6 Clicks" Mobility  Outcome Measure Help needed turning from your back to your side while in a flat bed without using bedrails?: None Help needed moving from lying on your back to sitting on the side of a flat bed without using bedrails?: None Help needed moving to and  from a bed to a chair (including a wheelchair)?: A Little Help needed standing up from a chair using your arms (e.g., wheelchair or bedside chair)?: A Little Help needed to walk in hospital room?: A Little Help needed climbing 3-5 steps with a railing? : A Little 6 Click Score: 20    End of Session   Activity Tolerance: Patient tolerated treatment well;Patient limited by fatigue Patient left: Other (comment);with call bell/phone within reach(left sitting on commode in bathroom - nursing staff notified) Nurse Communication: Mobility status PT Visit Diagnosis: Unsteadiness on feet (R26.81);Other abnormalities of gait and mobility (R26.89);Muscle weakness (generalized) (M62.81)    Time: [id number] PT Time Calculation (min) (ACUTE ONLY): 23 min   Charges:   PT Evaluation $PT Eval Moderate Complexity: 1 Mod PT Treatments $Therapeutic Activity: 23-37 mins        10:37 AM, 06/29/19 Lonell Grandchild, MPT Physical Therapist with [facility] [phone] office [phone] mobile phone

## 2019-06-30 DIAGNOSIS — I252 Old myocardial infarction: Secondary | ICD-10-CM | POA: Diagnosis not present

## 2019-06-30 DIAGNOSIS — K219 Gastro-esophageal reflux disease without esophagitis: Secondary | ICD-10-CM | POA: Diagnosis present

## 2019-06-30 DIAGNOSIS — E669 Obesity, unspecified: Secondary | ICD-10-CM | POA: Diagnosis present

## 2019-06-30 DIAGNOSIS — R7989 Other specified abnormal findings of blood chemistry: Secondary | ICD-10-CM | POA: Diagnosis present

## 2019-06-30 DIAGNOSIS — E876 Hypokalemia: Secondary | ICD-10-CM | POA: Diagnosis not present

## 2019-06-30 DIAGNOSIS — K449 Diaphragmatic hernia without obstruction or gangrene: Secondary | ICD-10-CM | POA: Diagnosis present

## 2019-06-30 DIAGNOSIS — E1165 Type 2 diabetes mellitus with hyperglycemia: Secondary | ICD-10-CM | POA: Diagnosis present

## 2019-06-30 DIAGNOSIS — D123 Benign neoplasm of transverse colon: Secondary | ICD-10-CM | POA: Diagnosis not present

## 2019-06-30 DIAGNOSIS — E785 Hyperlipidemia, unspecified: Secondary | ICD-10-CM | POA: Diagnosis present

## 2019-06-30 DIAGNOSIS — F419 Anxiety disorder, unspecified: Secondary | ICD-10-CM | POA: Diagnosis present

## 2019-06-30 DIAGNOSIS — D62 Acute posthemorrhagic anemia: Secondary | ICD-10-CM | POA: Diagnosis present

## 2019-06-30 DIAGNOSIS — K922 Gastrointestinal hemorrhage, unspecified: Secondary | ICD-10-CM | POA: Diagnosis present

## 2019-06-30 DIAGNOSIS — K589 Irritable bowel syndrome without diarrhea: Secondary | ICD-10-CM | POA: Diagnosis present

## 2019-06-30 DIAGNOSIS — E119 Type 2 diabetes mellitus without complications: Secondary | ICD-10-CM

## 2019-06-30 DIAGNOSIS — Z20822 Contact with and (suspected) exposure to covid-19: Secondary | ICD-10-CM | POA: Diagnosis present

## 2019-06-30 DIAGNOSIS — E1140 Type 2 diabetes mellitus with diabetic neuropathy, unspecified: Secondary | ICD-10-CM | POA: Diagnosis present

## 2019-06-30 DIAGNOSIS — I2510 Atherosclerotic heart disease of native coronary artery without angina pectoris: Secondary | ICD-10-CM | POA: Diagnosis present

## 2019-06-30 DIAGNOSIS — I1310 Hypertensive heart and chronic kidney disease without heart failure, with stage 1 through stage 4 chronic kidney disease, or unspecified chronic kidney disease: Secondary | ICD-10-CM | POA: Diagnosis present

## 2019-06-30 DIAGNOSIS — N1832 Chronic kidney disease, stage 3b: Secondary | ICD-10-CM | POA: Diagnosis present

## 2019-06-30 DIAGNOSIS — K921 Melena: Secondary | ICD-10-CM | POA: Diagnosis present

## 2019-06-30 DIAGNOSIS — I10 Essential (primary) hypertension: Secondary | ICD-10-CM | POA: Diagnosis not present

## 2019-06-30 DIAGNOSIS — E860 Dehydration: Secondary | ICD-10-CM | POA: Diagnosis present

## 2019-06-30 DIAGNOSIS — K3184 Gastroparesis: Secondary | ICD-10-CM | POA: Diagnosis present

## 2019-06-30 DIAGNOSIS — N179 Acute kidney failure, unspecified: Secondary | ICD-10-CM | POA: Diagnosis present

## 2019-06-30 DIAGNOSIS — J3089 Other allergic rhinitis: Secondary | ICD-10-CM | POA: Diagnosis present

## 2019-06-30 DIAGNOSIS — E1122 Type 2 diabetes mellitus with diabetic chronic kidney disease: Secondary | ICD-10-CM | POA: Diagnosis present

## 2019-06-30 DIAGNOSIS — E1151 Type 2 diabetes mellitus with diabetic peripheral angiopathy without gangrene: Secondary | ICD-10-CM | POA: Diagnosis present

## 2019-06-30 DIAGNOSIS — E1143 Type 2 diabetes mellitus with diabetic autonomic (poly)neuropathy: Secondary | ICD-10-CM | POA: Diagnosis present

## 2019-06-30 DIAGNOSIS — K5730 Diverticulosis of large intestine without perforation or abscess without bleeding: Secondary | ICD-10-CM | POA: Diagnosis not present

## 2019-06-30 DIAGNOSIS — K228 Other specified diseases of esophagus: Secondary | ICD-10-CM | POA: Diagnosis not present

## 2019-06-30 DIAGNOSIS — I69354 Hemiplegia and hemiparesis following cerebral infarction affecting left non-dominant side: Secondary | ICD-10-CM | POA: Diagnosis not present

## 2019-06-30 DIAGNOSIS — Z9889 Other specified postprocedural states: Secondary | ICD-10-CM | POA: Diagnosis not present

## 2019-06-30 LAB — RENAL FUNCTION PANEL
Albumin: 3.1 g/dL — ABNORMAL LOW (ref 3.5–5.0)
Anion gap: 6 (ref 5–15)
BUN: 17 mg/dL (ref 8–23)
CO2: 25 mmol/L (ref 22–32)
Calcium: 9.2 mg/dL (ref 8.9–10.3)
Chloride: 106 mmol/L (ref 98–111)
Creatinine, Ser: 1.06 mg/dL — ABNORMAL HIGH (ref 0.44–1.00)
GFR calc Af Amer: >60 mL/min (ref >60)
GFR calc non Af Amer: 55 mL/min — ABNORMAL LOW (ref >60)
Glucose, Bld: 222 mg/dL — ABNORMAL HIGH (ref 70–99)
Phosphorus: 2.2 mg/dL — ABNORMAL LOW (ref 2.5–4.6)
Potassium: 3.7 mmol/L (ref 3.5–5.1)
Sodium: 137 mmol/L (ref 135–145)

## 2019-06-30 LAB — GLUCOSE, CAPILLARY
Glucose-Capillary: 171 mg/dL — ABNORMAL HIGH (ref 70–99)
Glucose-Capillary: 216 mg/dL — ABNORMAL HIGH (ref 70–99)
Glucose-Capillary: 155 mg/dL — ABNORMAL HIGH (ref 70–99)
Glucose-Capillary: 196 mg/dL — ABNORMAL HIGH (ref 70–99)

## 2019-06-30 LAB — CBC
HCT: 34.7 % — ABNORMAL LOW (ref 36.0–46.0)
Hemoglobin: 11 g/dL — ABNORMAL LOW (ref 12.0–15.0)
MCH: 28.3 pg (ref 26.0–34.0)
MCHC: 31.7 g/dL (ref 30.0–36.0)
MCV: 89.2 fL (ref 80.0–100.0)
Platelets: 189 10*3/uL (ref 150–400)
RBC: 3.89 MIL/uL (ref 3.87–5.11)
RDW: 12.7 % (ref 11.5–15.5)
WBC: 5.5 10*3/uL (ref 4.0–10.5)
nRBC: 0 % (ref 0.0–0.2)

## 2019-06-30 LAB — OCCULT BLOOD X 1 CARD TO LAB, STOOL: Fecal Occult Bld: POSITIVE — AB

## 2019-06-30 MED ORDER — PANTOPRAZOLE SODIUM 40 MG IV SOLR
40.0000 mg | Freq: Two times a day (BID) | INTRAVENOUS | Status: DC
  Administered 2019-06-30 – 2019-07-02 (×3): 40 mg via INTRAVENOUS
  Filled 2019-06-30 (×3): qty 40

## 2019-06-30 NOTE — Progress Notes (Signed)
Patient Demographics:    [name], is a 64 y.o. female, DOB - 06-08-1955, [id number]  Admit date - 06/28/2019   Admitting Physician Albertine Patricia, MD  Outpatient Primary MD for the patient is [name], MD  LOS - 0   Chief Complaint  Patient presents with  . Hyperglycemia        Subjective:    [name] today has no fevers, no emesis,  No chest pain,   -Continues to have orthostatic dizziness, dark stools, epigastric discomfort,  Assessment  & Plan :    Active Problems:   DM2 (diabetes mellitus, type 2) (HCC)   Hyperlipidemia LDL goal <70   Essential hypertension   Diabetic gastroparesis associated with type 2 diabetes mellitus ([place])   Hypokalemia   Generalized weakness  Brief summary 64 y.o. female, with medical history significant forhypertension, diabetes, CAD with prior MI, prior history of CVA with residual left-sided weakness, dyslipidemia, anxiety, CKD stage IIIb, and peripheral vascular disease with prior stenting who typically ambulates with a walker, patient with recent hospitalization secondary to syncope, hypokalemia, discharged 2/15 with Holter monitor, patient presents to PCP office today, she was noted to have elevated blood sugar, dehydration, and tachycardia,(heart rate elevated to 140), for some dizziness, lightheadedness, and some nausea, patient had Holter monitor arranged upon recent discharge admitted on 06/28/2019 with orthostatic dizziness and tachycardia and now having black stools with concerns about GI bleed  A/p 1) possible acute blood loss anemia-- hemoglobin down to 11.3 from 15.3 on 06/28/2019 -Patient is a smoker and her hemoglobin typically runs higher -Patient with dark stools and history of ibuprofen and naproxen use Continue Protonix, stool occult blood is positive, GI consult requested, plan is for EGD on 07/01/2019 -Patient is on aspirin and  Plavix for history of PAD and CAD  2) sinus tachycardia and orthostatic dizziness--- suspect due to ABLA--management as above #1, continue to hydrate  3) hypokalemia--hold chlorthalidone, replace potassium IV and orally  4) hypertension--- chlorthalidone on hold due to orthostatic dizziness and hypokalemia, continue losartan  5)DM2-continue insulin regimen  Disposition/Need for in-Hospital Stay- patient unable to be discharged at this time due to --- orthostatic dizziness and back stools raising concern about symptomatic acute blood loss anemia with orthostatic dizziness and dark stools --- hemoglobin is down 4 g in less than 24 hours, for EGD on 07/01/2019 -Not medically ready for discharge  Code Status : Full  Family Communication:   NA (patient is alert, awake and coherent)  Consults  :  Gi  DVT Prophylaxis  :  - SCDs /GI bleed  Lab Results  Component Value Date   PLT 189 06/30/2019    Inpatient Medications  Scheduled Meds: . aspirin EC  81 mg Oral Daily  . citalopram  10 mg Oral Daily  . cloNIDine  0.1 mg Oral QHS  . clopidogrel  75 mg Oral Daily  . insulin aspart  0-15 Units Subcutaneous TID WC  . insulin aspart  0-5 Units Subcutaneous QHS  . losartan  50 mg Oral Daily  . metFORMIN  500 mg Oral BID WC  . pantoprazole  40 mg Oral BID  . potassium chloride  10 mEq Oral BID  . propranolol ER  60 mg Oral  QPM  . rosuvastatin  40 mg Oral Daily  . senna-docusate  2 tablet Oral QHS  . traZODone  100 mg Oral QHS   Continuous Infusions: . 0.9 % NaCl with KCl 20 mEq / L 50 mL/hr at 06/30/19 1111   PRN Meds:.acetaminophen **OR** acetaminophen, ALPRAZolam, HYDROcodone-acetaminophen, SUMAtriptan, temazepam    Anti-infectives (From admission, onward)   None        Objective:   Vitals:   06/29/19 2125 06/30/19 0530 06/30/19 0726 06/30/19 1817  BP: 125/77 (!) 102/53  130/64  Pulse: (!) 48 (!) 57    Resp: 20 17    Temp: 98.5 F (36.9 C) 98.4 F (36.9 C)    TempSrc:  Oral Oral    SpO2: 95% 97% 97%   Weight:      Height:        Wt Readings from Last 3 Encounters:  06/28/19 60 kg  06/20/19 60.9 kg  06/08/19 63.5 kg     Intake/Output Summary (Last 24 hours) at 06/30/2019 1900 Last data filed at 06/30/2019 0500 Gross per 24 hour  Intake 970.59 ml  Output --  Net 970.59 ml     Physical Exam  Gen:- Awake Alert, complains of orthostatic dizziness HEENT:- [place].AT, No sclera icterus Neck-Supple Neck,No JVD,.  Lungs-  CTAB , fair symmetrical air movement CV- S1, S2 normal, regular  Abd-  +ve B.Sounds, Abd Soft, mild epigastric discomfort without rebound or guarding    Extremity/Skin:- No  edema, pedal pulses present  Psych-affect is appropriate, oriented x3 Neuro-orthostatic dizziness, no new focal deficits, no tremors   Data Review:   Micro Results Recent Results (from the past 240 hour(s))  SARS CORONAVIRUS 2 (TAT 6-24 HRS) Nasopharyngeal Nasopharyngeal Swab     Status: None   Collection Time: 06/28/19  5:43 PM   Specimen: Nasopharyngeal Swab  Result Value Ref Range Status   SARS Coronavirus 2 NEGATIVE NEGATIVE Final    Comment: (NOTE) SARS-CoV-2 target nucleic acids are NOT DETECTED. The SARS-CoV-2 RNA is generally detectable in upper and lower respiratory specimens during the acute phase of infection. Negative results do not preclude SARS-CoV-2 infection, do not rule out co-infections with other pathogens, and should not be used as the sole basis for treatment or other patient management decisions. Negative results must be combined with clinical observations, patient history, and epidemiological information. The expected result is Negative. Fact Sheet for Patients: SugarRoll.be Fact Sheet for Healthcare Providers: https://www.woods-mathews.com/ This test is not yet approved or cleared by the [place] FDA and  has been authorized for detection and/or diagnosis of SARS-CoV-2 by FDA under  an Emergency Use Authorization (EUA). This EUA will remain  in effect (meaning this test can be used) for the duration of the COVID-19 declaration under Section 56 4(b)(1) of the Act, 21 U.S.C. section 360bbb-3(b)(1), unless the authorization is terminated or revoked sooner. Performed at [facility], [place] [address] [place], [place] [address]     Radiology Reports DG Chest 1 View  Result Date: 06/20/2019 CLINICAL DATA:  Status post fall with dizziness. EXAM: CHEST  1 VIEW COMPARISON:  March 26, 2019 FINDINGS: The heart size and mediastinal contours are within normal limits. Both lungs are clear. The visualized skeletal structures are unremarkable. IMPRESSION: No active disease. Electronically Signed   By: Abelardo Diesel M.D.   On: 06/20/2019 15:07   CT Head Wo Contrast  Result Date: 06/20/2019 CLINICAL DATA:  Poly trauma. Multiple falls. EXAM: CT HEAD WITHOUT CONTRAST CT CERVICAL SPINE  WITHOUT CONTRAST TECHNIQUE: Multidetector CT imaging of the head and cervical spine was performed following the standard protocol without intravenous contrast. Multiplanar CT image reconstructions of the cervical spine were also generated. COMPARISON:  None. FINDINGS: CT HEAD FINDINGS Brain: Generalized parenchymal volume loss with commensurate dilatation of the ventricles and sulci. No mass, hemorrhage, edema or other evidence of acute parenchymal abnormality. No extra-axial hemorrhage. Vascular: Chronic calcified atherosclerotic changes of the large vessels at the skull base. No unexpected hyperdense vessel. Skull: Normal. Negative for fracture or focal lesion. Sinuses/Orbits: No acute finding. Other: None. CT CERVICAL SPINE FINDINGS Alignment: Normal alignment. No evidence of acute vertebral body subluxation. Skull base and vertebrae: No fracture line or displaced fracture fragment seen. Facet joints are normally aligned throughout. Soft tissues and spinal canal: No prevertebral fluid or swelling. No  visible canal hematoma. Disc levels: C2-3 through C4-5 disc spaces are well preserved. Surgical fusion at C5 through C7. No more than mild central canal stenosis at any level. Upper chest: Negative. Other: Bilateral carotid atherosclerosis. Anterior cervical fusion hardware appears intact and appropriately positioned at the C5 through C7 vertebral body levels. IMPRESSION: 1. No acute intracranial abnormality. No intracranial mass, hemorrhage or edema. No skull fracture. 2. No fracture or acute subluxation within the cervical spine. Surgical fusion hardware at C5 through C7 appears intact and appropriately positioned. 3. Carotid atherosclerosis. Electronically Signed   By: Franki Cabot M.D.   On: 06/20/2019 15:12   CT ANGIO CHEST PE W OR WO CONTRAST  Result Date: 06/20/2019 CLINICAL DATA:  Syncope. Elevated D-dimer. EXAM: CT ANGIOGRAPHY CHEST WITH CONTRAST TECHNIQUE: Multidetector CT imaging of the chest was performed using the standard protocol during bolus administration of intravenous contrast. Multiplanar CT image reconstructions and MIPs were obtained to evaluate the vascular anatomy. CONTRAST:  52mL OMNIPAQUE IOHEXOL 350 MG/ML SOLN COMPARISON:  None. FINDINGS: Cardiovascular: Contrast injection is sufficient to demonstrate satisfactory opacification of the pulmonary arteries to the segmental level. There is no pulmonary embolus. The main pulmonary artery is within normal limits for size. There is no CT evidence of acute right heart strain. There are atherosclerotic changes of the visualized thoracic aorta. There is no evidence for a thoracic aortic aneurysm. Heart size is normal, without pericardial effusion. Mediastinum/Nodes: --No mediastinal or hilar lymphadenopathy. --No axillary lymphadenopathy. --No supraclavicular lymphadenopathy. --Normal thyroid gland. --there is a small hiatal hernia. There are surgical clips near the GE junction. Lungs/Pleura: Emphysematous changes are noted. There is no  pneumothorax or pleural effusion. There is no infiltrate. Upper Abdomen: No acute abnormality. Musculoskeletal: No chest wall abnormality. No acute or significant osseous findings. Review of the MIP images confirms the above findings. IMPRESSION: 1. No evidence for acute pulmonary embolism. 2. Emphysematous changes in the lungs. 3. Small hiatal hernia. 4. Aortic Atherosclerosis (ICD10-I70.0) and Emphysema (ICD10-J43.9). Electronically Signed   By: Constance Holster M.D.   On: 06/20/2019 19:53   CT Cervical Spine Wo Contrast  Result Date: 06/20/2019 CLINICAL DATA:  Poly trauma. Multiple falls. EXAM: CT HEAD WITHOUT CONTRAST CT CERVICAL SPINE WITHOUT CONTRAST TECHNIQUE: Multidetector CT imaging of the head and cervical spine was performed following the standard protocol without intravenous contrast. Multiplanar CT image reconstructions of the cervical spine were also generated. COMPARISON:  None. FINDINGS: CT HEAD FINDINGS Brain: Generalized parenchymal volume loss with commensurate dilatation of the ventricles and sulci. No mass, hemorrhage, edema or other evidence of acute parenchymal abnormality. No extra-axial hemorrhage. Vascular: Chronic calcified atherosclerotic changes of the large vessels at the skull base.  No unexpected hyperdense vessel. Skull: Normal. Negative for fracture or focal lesion. Sinuses/Orbits: No acute finding. Other: None. CT CERVICAL SPINE FINDINGS Alignment: Normal alignment. No evidence of acute vertebral body subluxation. Skull base and vertebrae: No fracture line or displaced fracture fragment seen. Facet joints are normally aligned throughout. Soft tissues and spinal canal: No prevertebral fluid or swelling. No visible canal hematoma. Disc levels: C2-3 through C4-5 disc spaces are well preserved. Surgical fusion at C5 through C7. No more than mild central canal stenosis at any level. Upper chest: Negative. Other: Bilateral carotid atherosclerosis. Anterior cervical fusion hardware  appears intact and appropriately positioned at the C5 through C7 vertebral body levels. IMPRESSION: 1. No acute intracranial abnormality. No intracranial mass, hemorrhage or edema. No skull fracture. 2. No fracture or acute subluxation within the cervical spine. Surgical fusion hardware at C5 through C7 appears intact and appropriately positioned. 3. Carotid atherosclerosis. Electronically Signed   By: Franki Cabot M.D.   On: 06/20/2019 15:12   US Carotid Bilateral  Result Date: 06/21/2019 CLINICAL DATA:  Stroke symptoms, hypertension, syncope, visual disturbance and diabetes EXAM: BILATERAL CAROTID DUPLEX ULTRASOUND TECHNIQUE: Pearline Cables scale imaging, color Doppler and duplex ultrasound were performed of bilateral carotid and vertebral arteries in the neck. COMPARISON:  None. FINDINGS: Criteria: Quantification of carotid stenosis is based on velocity parameters that correlate the residual internal carotid diameter with NASCET-based stenosis levels, using the diameter of the distal internal carotid lumen as the denominator for stenosis measurement. The following velocity measurements were obtained: RIGHT ICA: 111/34 cm/sec CCA: 123XX123 cm/sec SYSTOLIC ICA/CCA RATIO:  0.9 ECA: 101 cm/sec LEFT ICA: 119/33 cm/sec CCA: 0000000 cm/sec SYSTOLIC ICA/CCA RATIO:  [id number] ECA: 157 cm/sec RIGHT CAROTID ARTERY: Minor echogenic shadowing plaque formation. No hemodynamically significant right ICA stenosis, velocity elevation, or turbulent flow. Degree of narrowing less than 50%. RIGHT VERTEBRAL ARTERY:  Antegrade LEFT CAROTID ARTERY: Similar scattered minor echogenic plaque formation. No hemodynamically significant left ICA stenosis, velocity elevation, or turbulent flow. LEFT VERTEBRAL ARTERY:  Antegrade IMPRESSION: Minor carotid atherosclerosis. No hemodynamically significant ICA stenosis. Degree of narrowing estimated at less than 50% bilaterally by ultrasound criteria. Patent antegrade vertebral flow bilaterally Electronically  Signed   By: Jerilynn Mages.  Shick M.D.   On: 06/21/2019 14:26   DG Knee Complete 4 Views Left  Result Date: 06/20/2019 CLINICAL DATA:  Status post fall with left knee pain. EXAM: LEFT KNEE - COMPLETE 4+ VIEW COMPARISON:  None. FINDINGS: No evidence of fracture, dislocation, or joint effusion. Bipartite patella is noted. Soft tissues are unremarkable. IMPRESSION: No acute fracture or dislocation. Electronically Signed   By: Abelardo Diesel M.D.   On: 06/20/2019 15:05   ECHOCARDIOGRAM COMPLETE  Result Date: 06/21/2019    ECHOCARDIOGRAM REPORT   Patient Name:   [name] Date of Exam: 06/21/2019 Medical Rec #:  [id number]       Height:       64.0 in Accession #:    [id number]      Weight:       134.3 lb Date of Birth:  April 04, 1956       BSA:          1.65 m Patient Age:    89 years        BP:           129/88 mmHg Patient Gender: F               HR:           94  bpm. Exam Location:  [name] Procedure: 2D Echo Indications:    Syncope  History:        Patient has prior history of Echocardiogram examinations, most                 recent 09/15/2012. CAD, COPD; Risk Factors:Hypertension, Diabetes                 and Dyslipidemia.  Sonographer:    Leavy Cella RDCS (AE) Referring Phys: [phone] Royanne Foots [place]  1. Left ventricular ejection fraction, by estimation, is 65 to 70%. The left ventricle has hyperdynamic function. The left ventricle has no regional wall motion abnormalities. There is mild concentric left ventricular hypertrophy. Left ventricular diastolic parameters are consistent with Grade I diastolic dysfunction (impaired relaxation).  2. Right ventricular systolic function is normal. The right ventricular size is normal.  3. The mitral valve is grossly normal. No evidence of mitral valve regurgitation.  4. The aortic valve is tricuspid. Aortic valve regurgitation is not visualized. No aortic stenosis is present.  5. The inferior vena cava is normal in size with greater than 50% respiratory  variability, suggesting right atrial pressure of 3 mmHg. FINDINGS  Left Ventricle: Left ventricular ejection fraction, by estimation, is 65 to 70%. The left ventricle has hyperdynamic function. The left ventricle has no regional wall motion abnormalities. There is mild concentric left ventricular hypertrophy. Left ventricular diastolic parameters are consistent with Grade I diastolic dysfunction (impaired relaxation). Indeterminate filling pressures. Right Ventricle: The right ventricular size is normal. No increase in right ventricular wall thickness. Right ventricular systolic function is normal. Left Atrium: Left atrial size was normal in size. Right Atrium: Right atrial size was normal in size. Pericardium: There is no evidence of pericardial effusion. Mitral Valve: The mitral valve is grossly normal. No evidence of mitral valve regurgitation. Tricuspid Valve: The tricuspid valve is grossly normal. Tricuspid valve regurgitation is not demonstrated. Aortic Valve: The aortic valve is tricuspid. Aortic valve regurgitation is not visualized. No aortic stenosis is present. Pulmonic Valve: The pulmonic valve was not well visualized. Pulmonic valve regurgitation is not visualized. Aorta: The aortic root is normal in size and structure. Venous: The inferior vena cava is normal in size with greater than 50% respiratory variability, suggesting right atrial pressure of 3 mmHg. IAS/Shunts: No atrial level shunt detected by color flow Doppler.  LEFT VENTRICLE PLAX 2D LVIDd:         3.64 cm  Diastology LVIDs:         1.72 cm  LV e' lateral:   11.00 cm/s LV PW:         1.25 cm  LV E/e' lateral: 4.7 LV IVS:        1.13 cm  LV e' medial:    3.81 cm/s LVOT diam:     2.00 cm  LV E/e' medial:  13.6 LV SV Index:   28.39 LVOT Area:     3.14 cm  RIGHT VENTRICLE RV S prime:     9.68 cm/s TAPSE (M-mode): 1.6 cm LEFT ATRIUM             Index       RIGHT ATRIUM          Index LA diam:        2.60 cm 1.57 cm/m  RA Area:     8.25 cm LA  Vol (A2C):   27.4 ml 16.59 ml/m RA Volume:   15.70 ml 9.51 ml/m LA Vol (  A4C):   26.6 ml 16.11 ml/m LA Biplane Vol: 29.0 ml 17.56 ml/m   AORTA Ao Root diam: 2.60 cm MITRAL VALVE MV Area (PHT): 2.69 cm    SHUNTS MV Decel Time: 282 msec    Systemic Diam: 2.00 cm MV E velocity: 51.90 cm/s MV A velocity: 79.50 cm/s MV E/A ratio:  0.65 Kate Sable MD Electronically signed by Kate Sable MD Signature Date/Time: 06/21/2019/12:26:50 PM    Final    DG Hips Bilat W or Wo Pelvis 3-4 Views  Result Date: 06/20/2019 CLINICAL DATA:  Status post fall with hip pain. EXAM: DG HIP (WITH OR WITHOUT PELVIS) 3-4V BILAT COMPARISON:  None. FINDINGS: There is no evidence of hip fracture or dislocation. Calcifications are identified over the soft tissues of right hip. IMPRESSION: No acute fracture or dislocation. Electronically Signed   By: Abelardo Diesel M.D.   On: 06/20/2019 15:07     CBC Recent Labs  Lab 06/28/19 1347 06/29/19 0511 06/29/19 1257 06/30/19 0511  WBC 10.4 8.8  --  5.5  HGB 15.3* 11.3* 10.7* 11.0*  HCT 46.9* 35.3* 33.4* 34.7*  PLT 284 216  --  189  MCV 86.9 89.1  --  89.2  MCH 28.3 28.5  --  28.3  MCHC 32.6 32.0  --  31.7  RDW 12.7 41.7*  --  12.7  LYMPHSABS 2.0  --   --   --   MONOABS 0.9  --   --   --   EOSABS 0.1  --   --   --   BASOSABS 0.1  --   --   --     Chemistries  Recent Labs  Lab 06/28/19 1347 06/28/19 1537 06/29/19 0511 06/30/19 0511  NA 134*  --  135 137  K 2.7*  --  3.6 3.7  CL 88*  --  100 106  CO2 30  --  27 25  GLUCOSE 323*  --  202* 222*  BUN 25*  --  21 17  CREATININE 1.46*  --  1.13* 1.06*  CALCIUM 10.2  --  8.6* 9.2  MG  --  2.6*  --   --   AST 33  --   --   --   ALT 35  --   --   --   ALKPHOS 119  --   --   --   BILITOT 1.1  --   --   --    ------------------------------------------------------------------------------------------------------------------ No results for input(s): CHOL, HDL, LDLCALC, TRIG, CHOLHDL, LDLDIRECT in the last 72  hours.  Lab Results  Component Value Date   HGBA1C 9.1 (H) 06/28/2019   ------------------------------------------------------------------------------------------------------------------ No results for input(s): TSH, T4TOTAL, T3FREE, THYROIDAB in the last 72 hours.  Invalid input(s): FREET3 ------------------------------------------------------------------------------------------------------------------ No results for input(s): VITAMINB12, FOLATE, FERRITIN, TIBC, IRON, RETICCTPCT in the last 72 hours.  Coagulation profile No results for input(s): INR, PROTIME in the last 168 hours.  No results for input(s): DDIMER in the last 72 hours.  Cardiac Enzymes No results for input(s): CKMB, TROPONINI, MYOGLOBIN in the last 168 hours.  Invalid input(s): CK ------------------------------------------------------------------------------------------------------------------ No results found for: BNP   Roxan Hockey M.D on 06/30/2019 at 7:00 PM  Go to [email or web address] - for contact info  [facility] - Office  [phone]

## 2019-06-30 NOTE — Progress Notes (Signed)
Inpatient Diabetes Program Recommendations  AACE/ADA: New Consensus Statement on Inpatient Glycemic Control  Target Ranges:  Prepandial:   less than 140 mg/dL      Peak postprandial:   less than 180 mg/dL (1-2 hours)      Critically ill patients:  140 - 180 mg/dL   Results for DILIA, TARVER (MRN [id number]) as of 06/30/2019 09:24  Ref. Range 06/29/2019 07:53 06/29/2019 11:47 06/29/2019 16:36 06/29/2019 21:52 06/30/2019 07:45  Glucose-Capillary Latest Ref Range: 70 - 99 mg/dL 178 (H)  Novolog 3 units 191 (H)  Novolog 3 units 159 (H)  Novolog 3 units 172 (H) 171 (H)   Review of Glycemic Control  Outpatient Diabetes medications:OnmiPod with Humalog Current orders for Inpatient glycemic control:Novolog 0-15 units TID with meals, Novolog 0-5 units QHS  Inpatient Diabetes Program Recommendations:  Insulin-Basal:  If glucose becomes consistently over 180 mg/dl, please consider ordering Levemir 5 units daily.  A1C: A1C 9.1% on 2/22/21indicating an average glucose of 214 mg/dlover the past 2-3 months.  NOTE: Diabetes Coordinator spoke with patient on 06/29/19 and patient notes that she does not want to resume her insulin pump as and outpatient and prefers to use SQ insulin injections with insulin pens.  At time of discharge, please provide Rx for: basal  (Levemir) and bolus (Humalog) insulin pens and pen needles.  Thanks, Barnie Alderman, RN, MSN, CDE Diabetes Coordinator Inpatient Diabetes Program [phone] (Team Pager from 8am to 5pm)

## 2019-06-30 NOTE — Progress Notes (Signed)
GI MD is currently rounding on pt and performing evaluation. Pt is engaged and denies any pain or discomfort at this time. Call bell is within reach.

## 2019-06-30 NOTE — Consult Note (Signed)
Referring Provider: Roxan Hockey, MD Primary Care Physician:  [name], MD Primary Gastroenterologist:  Dr. Michelene Heady  Reason for Consultation:   Melena.  HPI:   Patient is 64 year old Caucasian female with multiple medical problems including chronic GERD diabetic gastroparesis remote history of peptic ulcer disease who is admitted to hospitalist service 2 days ago when she presented with hyperosmolar nonketotic state and profound hypokalemia.  Earlier in the day she was seen at her primary care physician's office and noted to be tachycardic and her blood glucose was 503.  Glucose levels have dropped with therapy.  On admission her hemoglobin was 15.3 g and it dropped to 10.7 yesterday and hemoglobin this morning was 11 g.  Patient was hospitalized last week for syncope and predischarge hemoglobin was 14 g which would be considered her baseline. Patient reports 1 week history of melena.  She states her stools been black and very foul-smelling.  This morning she noted a small amount of fresh blood as well.  She feels heartburn is well controlled with therapy.  She denies nausea vomiting or abdominal pain.  She has history of constipation.  She was given 2 tablets of Senokot yesterday and she says she had a good bowel movement this morning.  Her appetite is good and she denies involuntary weight loss.  Patient gives remote history of peptic ulcer disease.  She had EGD by Dr. Dartha Lodge in April 2019 for epigastric pain.  The study revealed small sliding hiatal hernia evidence of fundal wrap and food debris in stomach consistent with diagnosis of gastroparesis. She also gives history of significant GI bleed back in April 2013 when she had multiple studies and final impression was that she had diverticular bleed. Patient is on low-dose aspirin and clopidogrel because she has history of peripheral vascular disease status post left femoral-popliteal artery bypass.  Patient complains of feeling cold  in her right index finger.  She has not had any pain in this finger.  She says she forgot to make Dr. Alvester Chou aware of this symptom.  She will let him know on next visit. Patient's last colonoscopy was in April 2013 revealing scattered diverticula at transverse, descending and sigmoid colon.  Patient is divorced.  She has 1 daughter who is 28 years old and in good health except she has bronchial asthma.  She has 1 granddaughter.  She is disabled.  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  She used to work at [employer] and [employer] back before she had a stroke and became disabled. He lives alone.  She says she is very independent she has been smoking cigarettes for 38 years about half a pack a day.  She states she quit on Valentine's Day.  She drinks alcohol occasionally.  Father had multiple medical problems such as diabetes mellitus CHF and CVA and died at age 61.  Mother was treated for breast carcinoma in her 21s but lived to be 23.  She has 5 sisters.  To her disease.  1 died of MI at age 91 and the other 1 due to CVA at age 60.   Past Medical History:  Diagnosis Date  . 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 Anemia    hx  . Angioedema 07/10/2015  . Anxiety   . Arthritis    "neck, left hand" (09/14/2012)  . Asthma   . Cancer ([place]) 1985   ovarian, no treatment except surgery  . Complication of anesthesia  OCCASIONAL TROUBLE TURNING NECK TO RIGHT  . Critical lower limb ischemia    10/2014 s/p L SFA stenting  . Diverticulosis of colon with hemorrhage April 2013  . GERD (gastroesophageal reflux disease)   . H/O hiatal hernia   . Heart attack [facility])    2003 mild MI, March 2013 mild MI  . Hidradenitis    groin  . History of blood transfusion 1985 AND 2013  . Hyperlipidemia   . Hypertension   . Irritable bowel syndrome   . Left-sided weakness    since stroke, left eye trouble seeing  . Migraines   . Mild CAD    a. Cath 09/2010: mild luminal irregularities of LAD, 30% prox  RCA and 20-30% mRCA, EF 65%.  . Neuropathy   . Obesity   . PAD (peripheral artery disease) ([place])    a. critical limb ischemia s/p PTA/stenting of L SFA 10/2014. c. occ prior SFA stent by angio 01/2016, for possible PV bypass.  . Pneumonia    baby  . Recurrent upper respiratory infection (URI)   . S/P arterial stent-mid Lt SFA 11/03/14 11/04/2014  . Schatzki's ring   . Sinus problem   . Stomach ulcer 1972   non-bleeding  . Stroke ([place]) 07-2007, 07-2008, 07-2009   total 3 strokes; mild left sided weakness and left eye "jumps".  . Type II diabetes mellitus ([place])   . Vitamin B 12 deficiency 07-15-2013    Past Surgical History:  Procedure Laterality Date  . ABDOMINAL HYSTERECTOMY  1985  . ADENOIDECTOMY    . ANTERIOR CERVICAL DECOMP/DISCECTOMY FUSION  2002  . ANTERIOR CERVICAL DECOMP/DISCECTOMY FUSION N/A 04/08/2014   Procedure: Cervical Six-Seven ANTERIOR CERVICAL DECOMPRESSION/DISCECTOMY FUSION Plating and Bonegraft  2 LEVELS;  Surgeon: Ashok Pall, MD;  Location: [place] NEURO ORS;  Service: Neurosurgery;  Laterality: N/A;  Cervical Six-Seven ANTERIOR CERVICAL DECOMPRESSION/DISCECTOMY FUSION Plating and Bonegraft  2 LEVELS  . APPENDECTOMY  1985  . AXILLARY HIDRADENITIS EXCISION  1990-2008   bilateral  . BACK SURGERY    . BREAST BIOPSY Right 2007  . BREAST CYST EXCISION Right 2008  . BREAST REDUCTION SURGERY    . CARDIAC CATHETERIZATION  2004   mild disease  . CATARACT EXTRACTION W/PHACO Left 05/16/2015   Procedure: CATARACT EXTRACTION PHACO AND INTRAOCULAR LENS PLACEMENT (IOC);  Surgeon: Rutherford Guys, MD;  Location: [facility];  Service: Ophthalmology;  Laterality: Left;  CDE: 4.24  . CATARACT EXTRACTION W/PHACO Right 05/30/2015   Procedure: CATARACT EXTRACTION RIGHT EYE PHACO AND INTRAOCULAR LENS PLACEMENT ;  Surgeon: Rutherford Guys, MD;  Location: [facility];  Service: Ophthalmology;  Laterality: Right;  CDE:4.08  . CHOLECYSTECTOMY  1990's  . COLONOSCOPY  08/12/2011   Procedure: COLONOSCOPY;  Surgeon:  Ladene Artist, MD,FACG;  Location[facility] ENDOSCOPY;  Service: Endoscopy;  Laterality: N/A;  . COLONOSCOPY  06/19/2006  . cyst thigh Right   . ESOPHAGOGASTRODUODENOSCOPY  08/12/2011   Procedure: ESOPHAGOGASTRODUODENOSCOPY (EGD);  Surgeon: Ladene Artist, MD,FACG;  Location[facility];  Service: Endoscopy;  Laterality: N/A;  . ESOPHAGOGASTRODUODENOSCOPY  06/04/2005  . FEMORAL-POPLITEAL BYPASS GRAFT Left 06/19/2016   Procedure: Left Leg BYPASS GRAFT FEMORAL-POPLITEAL ARTERY;  Surgeon: Serafina Mitchell, MD;  Location: [place];  Service: Vascular;  Laterality: Left;  . GIVENS CAPSULE STUDY  08/13/2011   Procedure: GIVENS CAPSULE STUDY;  Surgeon: Ladene Artist, MD,FACG;  Location: [facility] ENDOSCOPY;  Service: Endoscopy;  Laterality: N/A;  . HAMMER TOE SURGERY Bilateral ~ 2000  . HIATAL HERNIA REPAIR    .  HYDRADENITIS EXCISION  01/2011; 03/2012   'groin and abdomen; 03/2012" (09/14/2012)  . HYDRADENITIS EXCISION  04/01/2012   Procedure: EXCISION HYDRADENITIS GROIN;  Surgeon: Pedro Earls, MD;  Location: [facility];  Service: General;  Laterality: Bilateral;  Excision of Hydradenitis of Perineum  . HYDRADENITIS EXCISION N/A 09/17/2013   Procedure: EXCISION PERINEAL HIDRADENITIS ;  Surgeon: Pedro Earls, MD;  Location: [facility];  Service: General;  Laterality: N/A;  also in the pubis area  . LEFT HEART CATH AND CORONARY ANGIOGRAPHY N/A 12/26/2016   Procedure: LEFT HEART CATH AND CORONARY ANGIOGRAPHY;  Surgeon: [name], Peter M, MD;  Location: [place] CV LAB;  Service: Cardiovascular;  Laterality: N/A;  . MASS EXCISION Right 09/17/2013   Procedure: EXCISION MASS;  Surgeon: Pedro Earls, MD;  Location: [facility];  Service: General;  Laterality: Right;  . NISSEN FUNDOPLICATION  99991111  . PERIPHERAL VASCULAR CATHETERIZATION N/A 11/03/2014   Procedure: Lower Extremity Angiography;  Surgeon: Lorretta Harp, MD;  Location: [place] CV LAB;  Service: Cardiovascular;  Laterality: N/A;  . PERIPHERAL VASCULAR  CATHETERIZATION N/A 01/29/2016   Procedure: Lower Extremity Angiography;  Surgeon: Lorretta Harp, MD;  Location: [place] CV LAB;  Service: Cardiovascular;  Laterality: N/A;  . POSTERIOR LUMBAR FUSION  2008 X 2  . REDUCTION MAMMAPLASTY  1996?  . TONSILLECTOMY AND ADENOIDECTOMY  1959 AND 2000  . UVULOPALATOPHARYNGOPLASTY, TONSILLECTOMY AND SEPTOPLASTY  2000's    Prior to Admission medications   Medication Sig Start Date End Date Taking? Authorizing Provider  albuterol (PROAIR HFA) 108 (90 Base) MCG/ACT inhaler 1-2 inhalations every 4-6 hours as needed for cough or wheeze. Patient taking differently: Inhale 1-2 puffs into the lungs every 4 (four) hours as needed for shortness of breath.  07/10/15  Yes Bobbitt, Sedalia Muta, MD  ALPRAZolam Duanne Moron) 0.5 MG tablet Take 0.5 mg by mouth 2 (two) times daily as needed for anxiety.   Yes [provider]  aspirin EC 81 MG tablet Take 81 mg by mouth daily.   Yes [provider]  Azelastine HCl 0.15 % SOLN Place 2 sprays into both nostrils 2 (two) times daily. Patient taking differently: Place 2 sprays into both nostrils 2 (two) times daily as needed (allergies).  07/10/15  Yes Bobbitt, Sedalia Muta, MD  chlorthalidone (HYGROTON) 25 MG tablet Take 1 tablet (25 mg total) by mouth daily. 06/08/19 09/06/19 Yes Lorretta Harp, MD  citalopram (CELEXA) 10 MG tablet Take 10 mg by mouth daily as needed.    Yes [provider]  cloNIDine (CATAPRES) 0.1 MG tablet Take 0.1 mg by mouth at bedtime. 05/30/19  Yes [provider]  clopidogrel (PLAVIX) 75 MG tablet TAKE 1 TABLET BY MOUTH EVERY DAY WITH SUPPER - NEEDS OFFICE VISIT Patient taking differently: Take 75 mg by mouth daily.  06/14/19  Yes Lorretta Harp, MD  EPINEPHrine 0.3 mg/0.3 mL IJ SOAJ injection Inject 0.3 mLs (0.3 mg total) into the muscle once. Patient taking differently: Inject 0.3 mg into the muscle as needed for anaphylaxis.  08/02/15  Yes Bobbitt, Sedalia Muta, MD   HYDROcodone-acetaminophen (NORCO) 7.5-325 MG tablet Take 1 tablet by mouth daily as needed for moderate pain.  03/09/19  Yes [provider]  insulin lispro (HUMALOG) 100 UNIT/ML injection Inject 5-12 Units into the skin 2 (two) times daily before a meal. Per sliding scale using OMNIPOD   Yes [provider]  losartan (COZAAR) 50 MG tablet Take 1 tablet (50 mg total) by  mouth daily. 05/04/19  Yes Lorretta Harp, MD  mupirocin ointment (BACTROBAN) 2 % Apply 1 application topically 2 (two) times daily. Patient taking differently: Apply 1 application topically 2 (two) times daily as needed.  06/02/19  Yes Wurst, [place], PA-C  naproxen (NAPROSYN) 375 MG tablet Take 1 tablet (375 mg total) by mouth 2 (two) times daily. Patient taking differently: Take 375 mg by mouth 2 (two) times daily as needed.  03/26/19  Yes Wurst, [place], PA-C  nitroGLYCERIN (NITROSTAT) 0.4 MG SL tablet PLACE 1 TAB UNDER TONGUE EVERY 5 MINS AS NEEDED FOR CHEST PAIN - MAX 3 DOSES THEN 911 Patient taking differently: Place 0.4 mg under the tongue every 5 (five) minutes as needed for chest pain. MAX 3 DOSES THEN 911 11/30/18  Yes Barrett, Evelene Croon, PA-C  pantoprazole (PROTONIX) 40 MG tablet TAKE 1 TABLET BY MOUTH EVERY DAY Patient taking differently: Take 40 mg by mouth daily.  01/07/17  Yes Lorretta Harp, MD  rosuvastatin (CRESTOR) 40 MG tablet Take 40 mg by mouth every morning.    Yes [provider]  SUMAtriptan (IMITREX) 100 MG tablet Take 100 mg by mouth every 2 (two) hours as needed for migraine.    Yes [provider]  temazepam (RESTORIL) 30 MG capsule Take 30 mg by mouth at bedtime as needed for sleep.   Yes [provider]  potassium chloride (KLOR-CON) 10 MEQ tablet Take 1 tablet (10 mEq total) by mouth 2 (two) times daily. Patient not taking: Reported on 06/28/2019 06/21/19 07/21/19  Heath Lark D, DO    Current Facility-Administered Medications  Medication Dose Route  Frequency Provider Last Rate Last Admin  . 0.9 % NaCl with KCl 20 mEq/ L  infusion   Intravenous Continuous Emokpae, Courage, MD 50 mL/hr at 06/30/19 1111 Rate Change at 06/30/19 1111  . acetaminophen (TYLENOL) tablet 650 mg  650 mg Oral Q6H PRN Roxan Hockey, MD   650 mg at 06/29/19 0947   Or  . acetaminophen (TYLENOL) suppository 650 mg  650 mg Rectal Q6H PRN Emokpae, Courage, MD      . ALPRAZolam Duanne Moron) tablet 0.5 mg  0.5 mg Oral BID PRN Emokpae, Courage, MD      . aspirin EC tablet 81 mg  81 mg Oral Daily Emokpae, Courage, MD   81 mg at 06/30/19 0953  . citalopram (CELEXA) tablet 10 mg  10 mg Oral Daily Emokpae, Courage, MD   10 mg at 06/30/19 1230  . cloNIDine (CATAPRES) tablet 0.1 mg  0.1 mg Oral QHS Emokpae, Courage, MD   0.1 mg at 06/29/19 2225  . clopidogrel (PLAVIX) tablet 75 mg  75 mg Oral Daily Emokpae, Courage, MD   75 mg at 06/30/19 0955  . HYDROcodone-acetaminophen (NORCO) 7.5-325 MG per tablet 1 tablet  1 tablet Oral Daily PRN Emokpae, Courage, MD      . insulin aspart (novoLOG) injection 0-15 Units  0-15 Units Subcutaneous TID WC Roxan Hockey, MD   3 Units at 06/30/19 1822  . insulin aspart (novoLOG) injection 0-5 Units  0-5 Units Subcutaneous QHS Roxan Hockey, MD   3 Units at 06/28/19 2142  . losartan (COZAAR) tablet 50 mg  50 mg Oral Daily Emokpae, Courage, MD   50 mg at 06/30/19 0954  . metFORMIN (GLUCOPHAGE) tablet 500 mg  500 mg Oral BID WC Emokpae, Courage, MD      . pantoprazole (PROTONIX) EC tablet 40 mg  40 mg Oral BID Roxan Hockey, MD   40  mg at 06/30/19 0953  . potassium chloride (KLOR-CON) CR tablet 10 mEq  10 mEq Oral BID Roxan Hockey, MD   10 mEq at 06/30/19 0955  . propranolol ER (INDERAL LA) 24 hr capsule 60 mg  60 mg Oral QPM Emokpae, Courage, MD   60 mg at 06/30/19 1817  . rosuvastatin (CRESTOR) tablet 40 mg  40 mg Oral Daily Emokpae, Courage, MD   40 mg at 06/30/19 0953  . senna-docusate (Senokot-S) tablet 2 tablet  2 tablet Oral QHS Roxan Hockey, MD   2 tablet at 06/29/19 2225  . SUMAtriptan (IMITREX) tablet 50 mg  50 mg Oral Q2H PRN Roxan Hockey, MD   50 mg at 06/30/19 0954  . temazepam (RESTORIL) capsule 30 mg  30 mg Oral QHS PRN Emokpae, Courage, MD      . traZODone (DESYREL) tablet 100 mg  100 mg Oral QHS Emokpae, Courage, MD   100 mg at 06/29/19 2225    Allergies as of 06/28/2019 - Review Complete 06/28/2019  Allergen Reaction Noted  . Sulfa antibiotics Shortness Of Breath and Palpitations 04/19/2012  . Codeine Other (See Comments) 08/02/2015  . Fish allergy Hives and Swelling 03/25/2014  . Iodine Swelling 12/14/2010  . Metformin and related Diarrhea 07/03/2011  . Soliqua [insulin glargine-lixisenatide] Itching and Other (See Comments) 10/06/2017  . Adhesive [tape] Rash 07/05/2011  . Shellfish allergy Swelling and Rash 09/11/2011    Family History  Problem Relation Age of Onset  . Breast cancer Mother   . Heart disease Mother   . Hypertension Mother   . Diabetes Father   . Heart disease Father   . Stroke Father   . Heart disease Sister   . Hypertension Sister   . Hypertension Sister   . Hyperlipidemia Sister   . Diabetes Sister   . Allergic rhinitis Sister   . Emphysema Other        great uncle  . Aneurysm Sister        brain  . Angioedema Neg Hx   . Asthma Neg Hx   . Eczema Neg Hx   . Immunodeficiency Neg Hx   . Urticaria Neg Hx     Social History   Socioeconomic History  . Marital status: Divorced    Spouse name: Not on file  . Number of children: 1  . Years of education: Not on file  . Highest education level: Not on file  Occupational History    Employer: UNEMPLOYED  Tobacco Use  . Smoking status: Former Smoker    Packs/day: 0.50    Years: 41.00    Pack years: 20.50    Types: Cigarettes    Quit date: 01/15/2016    Years since quitting: 3.4  . Smokeless tobacco: Never Used  . Tobacco comment: Getting ready to start nicotine patches RX by Dr. Gwenlyn Found per pt.  Substance and Sexual  Activity  . Alcohol use: No    Alcohol/week: 0.0 standard drinks  . Drug use: No    Types: "Crack" cocaine    Comment: 05/09/2015.  "quit 07/27/1994"  . Sexual activity: Not Currently    Birth control/protection: Abstinence  Other Topics Concern  . 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  Divorced, living alone in [place].     Social Determinants of Health   Financial Resource Strain:   . Difficulty of Paying Living Expenses:  Not on file  Food Insecurity:   . Worried About Charity fundraiser in the Last Year: Not on file  . Ran Out of Food in the Last Year: Not on file  Transportation Needs:   . Lack of Transportation (Medical): Not on file  . Lack of Transportation (Non-Medical): Not on file  Physical Activity:   . Days of Exercise per Week: Not on file  . Minutes of Exercise per Session: Not on file  Stress:   . Feeling of Stress : Not on file  Social Connections:   . Frequency of Communication with Friends and Family: Not on file  . Frequency of Social Gatherings with Friends and Family: Not on file  . Attends Religious Services: Not on file  . Active Member of Clubs or Organizations: Not on file  . Attends Archivist Meetings: Not on file  . Marital Status: Not on file  Intimate Partner Violence:   . Fear of Current or Ex-Partner: Not on file  . Emotionally Abused: Not on file  . Physically Abused: Not on file  . Sexually Abused: Not on file    Review of Systems: See HPI, otherwise normal ROS  Physical Exam: Temp:  [98.4 F (36.9 C)-98.5 F (36.9 C)] 98.4 F (36.9 C) (02/24 0530) Pulse Rate:  [48-57] 57 (02/24 0530) Resp:  [17-20] 17 (02/24 0530) BP: (102-130)/(53-77) 130/64 (02/24 1817) SpO2:  [95 %-97 %] 97 % (02/24 0726)   Patient is alert and in no acute distress. Her speech is normal. Conjunctivae is pink and  sclerae nonicteric. Oropharyngeal exam reveals normal mucosa.  She has upper and lower dentures in place. Neck without masses or thyromegaly. Cardiac exam with regular rhythm normal S1 and S2.  No murmur or gallop noted. She has event monitor in place. Auscultation of lungs reveal vesicular breath sounds bilaterally. Abdomen is full.  She has suprapubic scar along with vertical scars in lower abdomen.  She also has laparoscopy scars across upper abdomen.  On palpation abdomen is soft.  She has mild midepigastric tenderness.  No organomegaly or masses. She does not have peripheral edema.  She has scar along the anteromedial aspect of distal left thigh.  Lab Results: Recent Labs    06/28/19 1347 06/28/19 1347 06/29/19 0511 06/29/19 1257 06/30/19 0511  WBC 10.4  --  8.8  --  5.5  HGB 15.3*   < > 11.3* 10.7* 11.0*  HCT 46.9*   < > 35.3* 33.4* 34.7*  PLT 284  --  216  --  189   < > = values in this interval not displayed.   BMET Recent Labs    06/28/19 1347 06/29/19 0511 06/30/19 0511  NA 134* 135 137  K 2.7* 3.6 3.7  CL 88* 100 106  CO2 30 27 25   GLUCOSE 323* 202* 222*  BUN 25* 21 17  CREATININE 1.46* 1.13* 1.06*  CALCIUM 10.2 8.6* 9.2   LFT Recent Labs    06/28/19 1347 06/28/19 1347 06/30/19 0511  PROT 8.6*  --   --   ALBUMIN 4.6   < > 3.1*  AST 33  --   --   ALT 35  --   --   ALKPHOS 119  --   --   BILITOT 1.1  --   --    < > = values in this interval not displayed.    Assessment;  Patient is 64 year old female with multiple medical problems including coronary artery disease history of  CVA diabetes mellitus chronic GERD gastroparesis who was hospitalized 2 days ago for nonketotic hyperosmolar state and profound hypokalemia who had just been discharged a week earlier after having been hospitalized for vertigo and syncope.  Patient noted to have significant drop in her hemoglobin.  She gives 1 week history of melena and her stool is heme positive.  Patient is on  low-dose aspirin as well as clopidogrel.  She does have history of GI bleed back in April 2013 when she underwent EGD colonoscopy small bowel given capsule study and final impression was that she had diverticular bleed by Dr. [facility].  Her last EGD was in April 2019 for epigastric pain and was negative for peptic ulcer disease. Given history of melena need to rule out source and upper GI tract before considering further evaluation.  Patient is agreeable to proceed with esophagogastroduodenoscopy. Discussed issue of anticoagulation with Dr.Emopkae.  He is concerned about stopping these medications given history of coronary artery disease and peripheral vascular disease.  If there is evidence of continued bleeding will have to stop these medications temporarily.  Anemia.  Patient has been smoking until 12 days ago.  Her baseline hemoglobin is 14 g close to it.  She might have been intravascularly depleted when she presented.  Nevertheless hemoglobin has dropped by 4 g but has leveled off around 11 g.  We will continue to monitor.  Recommendations;  Esophagogastroduodenoscopy under monitored anesthesia care on 07/01/2019. Change pantoprazole to IV route at 40 mg every 12 hours.   LOS: 0 days   Yaeko Fazekas  06/30/2019, 7:09 PM

## 2019-06-30 NOTE — Progress Notes (Signed)
Physical Therapy Treatment Patient Details Name: [name] MRN: [id number] DOB: 1955/10/07 Today's Date: 06/30/2019    History of Present Illness [name]  is a 64 y.o. female,  with medical history significant for hypertension, diabetes, CAD with prior MI, prior history of CVA with residual left-sided weakness, dyslipidemia, anxiety, CKD stage IIIb, and peripheral vascular disease with prior stenting who typically ambulates with a walker, patient with recent hospitalization secondary to syncope, hypokalemia, discharged 2/15 with Holter monitor, patient presents to PCP office today, she was noted to have elevated blood sugar, dehydration, and tachycardia,(ports of heart rate elevated to 140), for some dizziness, lightheadedness, and some nausea, patient had Holter monitor arranged upon recent discharge, she denies any chest pain, shortness of breath, fever, chills, she does still reports dizziness, lightheadedness, and vertigo at baseline.- in ED patient with mild sinus tachycardia heart rate in the upper 90s, she was orthostatic systolic [id number] upon standing, 115 upon sitting, work-up significant for hypokalemia at 2.7 with a creatinine of 1.46, I was called to admit patient for her hypokalemia and orthostasis.    PT Comments    Pt friendly and reports she is feeling a little better today, was sitting in chair upon therapist entrance.  Pt limited by c/o headache and Rt shoulder pain, monitored through session.  Pt able to ambulate safely without AD in room for short distances, used RW for longer distance in hallway.  No LOB episodes during gait, slow cadence.  EOS pt was limited by fatigue following gait.  Pt left in chair with call bell within reach and warm blanket placed on shoulder for pain control.  RN aware of pt status and headache for possible medication if appropriate.    Follow Up Recommendations  Home health PT;Supervision - Intermittent     Equipment Recommendations  None  recommended by PT    Recommendations for Other Services       Precautions / Restrictions Precautions Precautions: Fall Restrictions Weight Bearing Restrictions: No    Mobility  Bed Mobility               General bed mobility comments: pt sitting in chair upon entrance  Transfers Overall transfer level: Modified independent Equipment used: Rolling walker (2 wheeled);None Transfers: Sit to/from Stand Sit to Stand: Supervision         General transfer comment: increased time, slightly labored movement, can ambulate safely for short distances in room without AD  Ambulation/Gait Ambulation/Gait assistance: Supervision Gait Distance (Feet): 95 Feet Assistive device: Rolling walker (2 wheeled) Gait Pattern/deviations: Decreased step length - right;Decreased step length - left;Decreased stride length Gait velocity: decreased   General Gait Details: slow slightly labored cadence without loss of balance, limited secondary to c/o fatigue   Stairs             Wheelchair Mobility    Modified Rankin (Stroke Patients Only)       Balance                                            Cognition Arousal/Alertness: Awake/alert Behavior During Therapy: WFL for tasks assessed/performed Overall Cognitive Status: Within Functional Limits for tasks assessed  Exercises      General Comments        Pertinent Vitals/Pain Pain Assessment: 0-10 Pain Score: 7  Pain Location: headache, right shoulder blade Pain Descriptors / Indicators: Aching Pain Intervention(s): Monitored during session;Limited activity within patient's tolerance;Repositioned;Heat applied    Home Living                      Prior Function            PT Goals (current goals can now be found in the care plan section)      Frequency    Min 3X/week      PT Plan Current plan remains appropriate     Co-evaluation              AM-PAC PT "6 Clicks" Mobility   Outcome Measure  Help needed turning from your back to your side while in a flat bed without using bedrails?: None Help needed moving from lying on your back to sitting on the side of a flat bed without using bedrails?: None Help needed moving to and from a bed to a chair (including a wheelchair)?: A Little Help needed standing up from a chair using your arms (e.g., wheelchair or bedside chair)?: A Little Help needed to walk in hospital room?: A Little Help needed climbing 3-5 steps with a railing? : A Little 6 Click Score: 20    End of Session Equipment Utilized During Treatment: Gait belt Activity Tolerance: Patient tolerated treatment well;Patient limited by fatigue;Patient limited by pain;No increased pain Patient left: in chair;with call bell/phone within reach Nurse Communication: Mobility status PT Visit Diagnosis: Unsteadiness on feet (R26.81);Other abnormalities of gait and mobility (R26.89);Muscle weakness (generalized) (M62.81)     Time: [id number] PT Time Calculation (min) (ACUTE ONLY): 25 min  Charges:  $Therapeutic Activity: 23-37 mins                     Ihor Austin, LPTA/CLT; CBIS [phone]   Aldona Lento 06/30/2019, 1:06 PM

## 2019-07-01 ENCOUNTER — Inpatient Hospital Stay (HOSPITAL_COMMUNITY): Payer: Medicare Other | Admitting: Anesthesiology

## 2019-07-01 ENCOUNTER — Encounter (HOSPITAL_COMMUNITY): Payer: Self-pay | Admitting: Family Medicine

## 2019-07-01 ENCOUNTER — Encounter (HOSPITAL_COMMUNITY)
Admission: EM | Disposition: A | Discharge status: Home Health | Payer: Self-pay | Source: Ambulatory Visit | Attending: Family Medicine

## 2019-07-01 DIAGNOSIS — K228 Other specified diseases of esophagus: Secondary | ICD-10-CM

## 2019-07-01 DIAGNOSIS — Z9889 Other specified postprocedural states: Secondary | ICD-10-CM

## 2019-07-01 DIAGNOSIS — K449 Diaphragmatic hernia without obstruction or gangrene: Secondary | ICD-10-CM

## 2019-07-01 HISTORY — PX: ESOPHAGOGASTRODUODENOSCOPY (EGD) WITH PROPOFOL: SHX5813

## 2019-07-01 LAB — BASIC METABOLIC PANEL
Anion gap: 7 (ref 5–15)
BUN: 15 mg/dL (ref 8–23)
CO2: 23 mmol/L (ref 22–32)
Calcium: 9.1 mg/dL (ref 8.9–10.3)
Chloride: 109 mmol/L (ref 98–111)
Creatinine, Ser: 1.09 mg/dL — ABNORMAL HIGH (ref 0.44–1.00)
GFR calc Af Amer: >60 mL/min (ref >60)
GFR calc non Af Amer: 54 mL/min — ABNORMAL LOW (ref >60)
Glucose, Bld: 222 mg/dL — ABNORMAL HIGH (ref 70–99)
Potassium: 3.8 mmol/L (ref 3.5–5.1)
Sodium: 139 mmol/L (ref 135–145)

## 2019-07-01 LAB — CBC
HCT: 33.3 % — ABNORMAL LOW (ref 36.0–46.0)
Hemoglobin: 10.4 g/dL — ABNORMAL LOW (ref 12.0–15.0)
MCH: 27.7 pg (ref 26.0–34.0)
MCHC: 31.2 g/dL (ref 30.0–36.0)
MCV: 88.8 fL (ref 80.0–100.0)
Platelets: 192 K/uL (ref 150–400)
RBC: 3.75 MIL/uL — ABNORMAL LOW (ref 3.87–5.11)
RDW: 12.7 % (ref 11.5–15.5)
WBC: 6.3 K/uL (ref 4.0–10.5)
nRBC: 0 % (ref 0.0–0.2)

## 2019-07-01 LAB — GLUCOSE, CAPILLARY
Glucose-Capillary: 176 mg/dL — ABNORMAL HIGH (ref 70–99)
Glucose-Capillary: 184 mg/dL — ABNORMAL HIGH (ref 70–99)
Glucose-Capillary: 225 mg/dL — ABNORMAL HIGH (ref 70–99)

## 2019-07-01 SURGERY — ESOPHAGOGASTRODUODENOSCOPY (EGD) WITH PROPOFOL
Anesthesia: General

## 2019-07-01 MED ORDER — SODIUM CHLORIDE 0.9 % IV SOLN: INTRAVENOUS | Status: DC

## 2019-07-01 MED ORDER — LACTATED RINGERS IV SOLN: INTRAVENOUS | Status: DC | PRN

## 2019-07-01 MED ORDER — STERILE WATER FOR IRRIGATION IR SOLN
Status: DC | PRN
  Administered 2019-07-01: 1.5 mL

## 2019-07-01 MED ORDER — PROPOFOL 500 MG/50ML IV EMUL
INTRAVENOUS | Status: DC | PRN
  Administered 2019-07-01: 150 ug/kg/min via INTRAVENOUS

## 2019-07-01 MED ORDER — PEG 3350-KCL-NA BICARB-NACL 420 G PO SOLR
4000.0000 mL | Freq: Once | ORAL | Status: AC
  Administered 2019-07-02: 4000 mL via ORAL
  Filled 2019-07-01: qty 4000

## 2019-07-01 MED ORDER — KETAMINE HCL 10 MG/ML IJ SOLN
INTRAMUSCULAR | Status: DC | PRN
  Administered 2019-07-01 (×2): 10 mg via INTRAVENOUS

## 2019-07-01 MED ORDER — PROPOFOL 10 MG/ML IV BOLUS
INTRAVENOUS | Status: DC | PRN
  Administered 2019-07-01: 30 mg via INTRAVENOUS

## 2019-07-01 NOTE — Progress Notes (Addendum)
   hemoglobin down to 10.4 from 15.3 on 06/28/2019 -Patient with dark stools and history of ibuprofen and naproxen use -Continue Protonix, positive stool occult blood noted,   --Blood pressure stable -Patient is medically cleared to undergo EGD under anesthesia on 07/01/2019 to try to determine source of GI blood loss/acute blood loss anemia especially given that patient is on aspirin Plavix and has used NSAIDs recently  -Patient is on aspirin and Plavix for history of PAD and CAD  Patient is medically cleared to undergo EGD under anesthesia on 07/01/2019   Roxan Hockey, MD

## 2019-07-01 NOTE — Progress Notes (Signed)
Brief EGD note  Normal mucosa of the esophagus. Irregular GE junction. Small sliding hiatal hernia.  Focal erythematous mucosa just distal to the GEJ without stigmata of bleeding. Stomach was empty.  Loose fundal wrap.  No ulceration or mucosal abnormalities noted. Patent pyloric channel. Normal bulbar and post bulbar mucosa.  No bleeding lesion identified.

## 2019-07-01 NOTE — Progress Notes (Signed)
  Subjective:  Patient complains of dry mouth and burning epigastric pain.  No nausea vomiting or melena reported today.  She is hoping to find a source of GI bleeding during this hospitalization.  Objective: Blood pressure (!) 119/54, pulse 66, temperature 98.3 F (36.8 C), temperature source Oral, resp. rate 16, height '5\' 4"'$  (1.626 m), weight 60 kg, SpO2 99 %. Patient is alert and in no acute distress. Cardiac exam with regular rhythm normal S1 and S2.  No murmur gallop noted. Auscultation of lungs reveal vesicular breath sounds bilaterally. Abdomen with normal bowel sounds.  She has mild midepigastric tenderness.  No organomegaly or masses. No LE edema noted  Labs/studies Results:  CBC Latest Ref Rng & Units 07/01/2019 06/30/2019 06/29/2019  WBC 4.0 - 10.5 K/uL 6.3 5.5 -  Hemoglobin 12.0 - 15.0 g/dL 10.4(L) 11.0(L) 10.7(L)  Hematocrit 36.0 - 46.0 % 33.3(L) 34.7(L) 33.4(L)  Platelets 150 - 400 K/uL 192 189 -    CMP Latest Ref Rng & Units 07/01/2019 06/30/2019 06/29/2019  Glucose 70 - 99 mg/dL 222(H) 222(H) 202(H)  BUN 8 - 23 mg/dL '15 17 21  '$ Creatinine 0.44 - 1.00 mg/dL 1.09(H) 1.06(H) 1.13(H)  Sodium 135 - 145 mmol/L 139 137 135  Potassium 3.5 - 5.1 mmol/L 3.8 3.7 3.6  Chloride 98 - 111 mmol/L 109 106 100  CO2 22 - 32 mmol/L '23 25 27  '$ Calcium 8.9 - 10.3 mg/dL 9.1 9.2 8.6(L)  Total Protein 6.5 - 8.1 g/dL - - -  Total Bilirubin 0.3 - 1.2 mg/dL - - -  Alkaline Phos 38 - 126 U/L - - -  AST 15 - 41 U/L - - -  ALT 0 - 44 U/L - - -    Hepatic Function Latest Ref Rng & Units 06/30/2019 06/28/2019 06/20/2019  Total Protein 6.5 - 8.1 g/dL - 8.6(H) 8.5(H)  Albumin 3.5 - 5.0 g/dL 3.1(L) 4.6 4.6  AST 15 - 41 U/L - 33 29  ALT 0 - 44 U/L - 35 33  Alk Phosphatase 38 - 126 U/L - 119 110  Total Bilirubin 0.3 - 1.2 mg/dL - 1.1 1.6(H)  Bilirubin, Direct 0.1 - 0.5 mg/dL - - -    No results found for: CRP    Assessment:  #1.  Melena.  She has not experienced melena today.  Hemoglobin low but  seems to have leveled off.  Patient has been on low-dose aspirin and clopidogrel.  She also complains of epigastric pain.  She could have peptic ulcer disease.  She could also have GI angiodysplasia.  #2.  Diabetes mellitus.  Patient presented with hyperosmolar nonketotic state.  Glucose levels have remained below 200 today.  Patient is n.p.o. for procedure.  #3.  Chronic kidney disease.  Renal function is improved with hydration.  Acute injury seem to have resolved.  #4.  History of peripheral vascular disease as well as coronary artery disease.  Patient has been on dual antiplatelet therapy.  If she has peptic ulcer disease would have to revisit need for dual antiplatelet therapy.  Will discuss with her cardiologist Dr. Alvester Chou  Recommendations  For esophagogastroduodenoscopy this afternoon under monitored anesthesia care. If EGD is normal we will proceed with colonoscopy on 07/02/2019.

## 2019-07-01 NOTE — Progress Notes (Signed)
PT Cancellation Note  Patient Details Name: [name] MRN: [id number] DOB: [birth year]   Cancelled Treatment:    Reason Eval/Treat Not Completed: Patient at procedure or test/unavailable Pt in EGD procedure, out of room and unavailable for therapy today.  Ihor Austin, LPTA/CLT; CBIS [phone]  Aldona Lento 07/01/2019, 5:51 PM

## 2019-07-01 NOTE — Progress Notes (Signed)
Pt is being transported to ENDO for EGD. She is alert and oriented and shows no visible signs of distress at this time.

## 2019-07-01 NOTE — Anesthesia Preprocedure Evaluation (Signed)
Anesthesia Evaluation  Patient identified by MRN, date of birth, ID band Patient awake  General Assessment Comment:Denies personal or FH of issues related to anesthesia   Reviewed: Allergy & Precautions, NPO status , Patient's Chart, lab work & pertinent test results  Airway Mallampati: II  TM Distance: >3 FB Neck ROM: Full    Dental no notable dental hx. (+) Edentulous Upper, Edentulous Lower   Pulmonary asthma , pneumonia, resolved, Recent URI , Resolved, former smoker,    Pulmonary exam normal breath sounds clear to auscultation       Cardiovascular Exercise Tolerance: Good hypertension, + CAD, + Past MI and + Peripheral Vascular Disease  Normal cardiovascular examI Rhythm:Regular Rate:Normal     Neuro/Psych  Headaches, Anxiety CVA, No Residual Symptoms negative psych ROS   GI/Hepatic Neg liver ROS, hiatal hernia, PUD, GERD  Medicated and Poorly Controlled,  Endo/Other  negative endocrine ROSdiabetes, Poorly Controlled, Insulin Dependent  Renal/GU negative Renal ROS  negative genitourinary   Musculoskeletal  (+) Arthritis , Osteoarthritis,    Abdominal   Peds negative pediatric ROS (+)  Hematology negative hematology ROS (+) anemia ,   Anesthesia Other Findings   Reproductive/Obstetrics negative OB ROS                             Anesthesia Physical Anesthesia Plan  ASA: III  Anesthesia Plan: General   Post-op Pain Management:    Induction: Intravenous  PONV Risk Score and Plan: 3 and Propofol infusion, TIVA, Treatment may vary due to age or medical condition and Ondansetron  Airway Management Planned: Simple Face Mask and Nasal Cannula  Additional Equipment:   Intra-op Plan:   Post-operative Plan:   Informed Consent: I have reviewed the patients History and Physical, chart, labs and discussed the procedure including the risks, benefits and alternatives for the proposed  anesthesia with the patient or authorized representative who has indicated his/her understanding and acceptance.     Dental advisory given  Plan Discussed with: CRNA  Anesthesia Plan Comments: (Plan Full PPE use  Plan GA with GETA as needed d/w pt -WTP with same after Q&A)        Anesthesia Quick Evaluation

## 2019-07-01 NOTE — Transfer of Care (Signed)
Immediate Anesthesia Transfer of Care Note  Patient: [name]  Procedure(s) Performed: ESOPHAGOGASTRODUODENOSCOPY (EGD) WITH PROPOFOL (N/A )  Patient Location: PACU  Anesthesia Type:General  Level of Consciousness: alert   Airway & Oxygen Therapy: Patient Spontanous Breathing  Post-op Assessment: Report given to RN  Post vital signs: Reviewed  Last Vitals:  Vitals Value Taken Time  BP 112/58 07/01/19 1747  Temp    Pulse 64 07/01/19 1749  Resp 22 07/01/19 1749  SpO2 98 % 07/01/19 1749  Vitals shown include unvalidated device data.  Last Pain:  Vitals:   07/01/19 1735  TempSrc:   PainSc: 0-No pain      Patients Stated Pain Goal: 5 ([id number])  Complications: No apparent anesthesia complications

## 2019-07-01 NOTE — Progress Notes (Signed)
Pt is currently still in ENDO.

## 2019-07-01 NOTE — Anesthesia Postprocedure Evaluation (Signed)
Anesthesia Post Note  Patient: [name]  Procedure(s) Performed: ESOPHAGOGASTRODUODENOSCOPY (EGD) WITH PROPOFOL (N/A )  Patient location during evaluation: PACU Anesthesia Type: General Level of consciousness: awake and alert and oriented Pain management: pain level controlled Vital Signs Assessment: post-procedure vital signs reviewed and stable Respiratory status: spontaneous breathing Cardiovascular status: blood pressure returned to baseline and stable Postop Assessment: no apparent nausea or vomiting Anesthetic complications: no     Last Vitals:  Vitals:   07/01/19 1541 07/01/19 1747  BP: 112/64 (!) 112/58  Pulse: (!) 51   Resp: (!) 9 11  Temp: 36.9 C 36.6 C  SpO2: 100% (P) 100%    Last Pain:  Vitals:   07/01/19 1735  TempSrc:   PainSc: 0-No pain                 Sakeenah Valcarcel

## 2019-07-01 NOTE — Op Note (Signed)
[facility] Patient Name: [name] Procedure Date: 07/01/2019 5:21 PM MRN: [id number] Date of Birth: Jan 01, 1956 Attending MD: Hildred Laser , MD CSN: [id number] Age: 64 Admit Type: Inpatient Procedure:                Upper GI endoscopy Indications:              Melena Providers:                Hildred Laser, MD, Charlsie Quest. Theda Sers RN, RN, Aram Candela Referring MD:             Roxan Hockey, MD Medicines:                Propofol per Anesthesia Complications:            No immediate complications. Estimated Blood Loss:     Estimated blood loss: none. Procedure:                Pre-Anesthesia Assessment:                           - Prior to the procedure, a History and Physical                            was performed, and patient medications and                            allergies were reviewed. The patient's tolerance of                            previous anesthesia was also reviewed. The risks                            and benefits of the procedure and the sedation                            options and risks were discussed with the patient.                            All questions were answered, and informed consent                            was obtained. Prior Anticoagulants: The patient                            last took Plavix (clopidogrel) 1 day prior to the                            procedure. ASA Grade Assessment: III - A patient                            with severe systemic disease. After reviewing the  risks and benefits, the patient was deemed in                            satisfactory condition to undergo the procedure.                           After obtaining informed consent, the endoscope was                            passed under direct vision. Throughout the                            procedure, the patient's blood pressure, pulse, and                            oxygen saturations were  monitored continuously. The                            [id number]) scope was introduced through the                            mouth, and advanced to the second part of duodenum.                            The upper GI endoscopy was accomplished without                            difficulty. The patient tolerated the procedure                            well. Scope In: 5:34:28 PM Scope Out: 5:39:19 PM Total Procedure Duration: 0 hours 4 minutes 51 seconds  Findings:      The examined esophagus was normal.      The Z-line was irregular and was found 35 cm from the incisors.      A 2 cm hiatal hernia was present. Focal erythema to mucosa below GEJ.      Evidence of a Nissen fundoplication was found in the cardia. The wrap       appeared loose.      The exam of the stomach was otherwise normal.      The duodenal bulb, second portion of the duodenum and area of the       papilla were normal. Impression:               - Normal esophagus.                           - Z-line irregular, 35 cm from the incisors.                           - 2 cm hiatal hernia with focal erythema to mucosa                            below GEJ.                           -  A Nissen fundoplication was found. The wrap                            appears loose.                           - Normal duodenal bulb, second portion of the                            duodenum and area of the papilla.                           - No specimens collected. Moderate Sedation:      Per Anesthesia Care Recommendation:           - Patient has a contact number available for                            emergencies. The signs and symptoms of potential                            delayed complications were discussed with the                            patient. Return to normal activities tomorrow.                            Written discharge instructions were provided to the                            patient.                            - Clear liquid diet today.                           - Continue present medications.                           - Perform a colonoscopy tomorrow. Procedure Code(s):        --- Professional ---                           [phone], Esophagogastroduodenoscopy, flexible,                            transoral; diagnostic, including collection of                            specimen(s) by brushing or washing, when performed                            (separate procedure) Diagnosis Code(s):        --- Professional ---                           K22.8, Other specified diseases of esophagus  K44.9, Diaphragmatic hernia without obstruction or                            gangrene                           Z98.890, Other specified postprocedural states                           K92.1, Melena (includes Hematochezia) CPT copyright 2019 [employer]. All rights reserved. The codes documented in this report are preliminary and upon coder review may  be revised to meet current compliance requirements. Hildred Laser, MD Hildred Laser, MD 07/01/2019 5:57:53 PM This report has been signed electronically. Number of Addenda: 0

## 2019-07-01 NOTE — Progress Notes (Signed)
Patient Demographics:    [name], is a 64 y.o. female, DOB - 1956-02-17, OX:[id number]  Admit date - 06/28/2019   Admitting Physician Roxan Hockey, MD  Outpatient Primary MD for the patient is [name], MD  LOS - 1   Chief Complaint  Patient presents with  . Hyperglycemia        Subjective:    [name] today has no fevers, no emesis,  No chest pain,   -Hungry, would like to eat -Denies abdominal pain,   Assessment  & Plan :    Active Problems:   DM2 (diabetes mellitus, type 2) (HCC)   Hyperlipidemia LDL goal <70   Essential hypertension   Diabetic gastroparesis associated with type 2 diabetes mellitus (HCC)   Hypokalemia   Generalized weakness   GI bleed  Brief summary 64 y.o. female, with medical history significant forhypertension, diabetes, CAD with prior MI, prior history of CVA with residual left-sided weakness, dyslipidemia, anxiety, CKD stage IIIb, and peripheral vascular disease with prior stenting who typically ambulates with a walker, patient with recent hospitalization secondary to syncope, hypokalemia, discharged 2/15 with Holter monitor, patient presents to PCP office today, she was noted to have elevated blood sugar, dehydration, and tachycardia,(heart rate elevated to 140), for some dizziness, lightheadedness, and some nausea, patient had Holter monitor arranged upon recent discharge admitted on 06/28/2019 with orthostatic dizziness and tachycardia and now having black stools with concerns about GI bleed  A/p 1) possible acute blood loss anemia-- ??  GI source -hemoglobin down to 10.4 from 15.3 on 06/28/2019 -Patient with dark stools and history of ibuprofen and naproxen use -Continue Protonix, positive stool occult blood noted,  -Underwent EGD on 07/01/2019 try to determine source of GI blood loss/acute blood loss anemia especially given that patient is on  aspirin Plavix and has used NSAIDs recently -EGD unrevealing, plan is for colonoscopy on 07/02/2019 -Patient is on aspirin and Plavix for history of PAD and CAD  2) sinus tachycardia and orthostatic dizziness--- suspect due to ABLA--management as above #1--- improving with hydration, continue to hydrate  3) DM2-A1c is 9.4 reflecting uncontrolled DM, patient will have limited oral intake due to prep for colonoscopy, avoid being too aggressive with glycemic control at this time -Use Novolog/Humalog Sliding scale insulin with Accu-Cheks/Fingersticks as ordered   4) hypertension--- chlorthalidone on hold due to orthostatic dizziness and hypokalemia, continue losartan, propanolol ER and clonidine  Disposition/Need for in-Hospital Stay- patient unable to be discharged at this time due to --- orthostatic dizziness and back stools raising concern about symptomatic acute blood loss anemia with orthostatic dizziness and dark stools --- hemoglobin is down 5 g (10.4 from 15.3) in less than 24 hours, EGD on 07/01/2019 unrevealing, colonoscopy planned for 07/02/2019 -Not medically ready for discharge  Code Status : Full  Family Communication:   NA (patient is alert, awake and coherent)  Consults  :  Gi  Procedures:- EGD on 07/01/2019 unrevealing, colonoscopy planned for 07/02/2019  DVT Prophylaxis  :  - SCDs /GI bleed  Lab Results  Component Value Date   PLT 192 07/01/2019   Inpatient Medications  Scheduled Meds: . [MAR Hold] citalopram  10 mg Oral Daily  . [MAR Hold] cloNIDine  0.1 mg Oral QHS  . [  MAR Hold] insulin aspart  0-15 Units Subcutaneous TID WC  . [MAR Hold] insulin aspart  0-5 Units Subcutaneous QHS  . [MAR Hold] losartan  50 mg Oral Daily  . [MAR Hold] pantoprazole (PROTONIX) IV  40 mg Intravenous Q12H  . polyethylene glycol-electrolytes  4,000 mL Oral Once  . [MAR Hold] potassium chloride  10 mEq Oral BID  . [MAR Hold] propranolol ER  60 mg Oral QPM  . [MAR Hold] rosuvastatin  40  mg Oral Daily  . [MAR Hold] senna-docusate  2 tablet Oral QHS  . [MAR Hold] traZODone  100 mg Oral QHS   Continuous Infusions: . sodium chloride    . 0.9 % NaCl with KCl 20 mEq / L 50 mL/hr at 06/30/19 1111   PRN Meds:.[MAR Hold] acetaminophen **OR** [MAR Hold] acetaminophen, [MAR Hold] ALPRAZolam, [MAR Hold] HYDROcodone-acetaminophen, [MAR Hold] SUMAtriptan, [MAR Hold] temazepam   Anti-infectives (From admission, onward)   None        Objective:   Vitals:   06/30/19 2031 07/01/19 0500 07/01/19 1541 07/01/19 1747  BP: (!) 123/49 (!) 119/54 112/64 (!) 112/58  Pulse: (!) 51 66 (!) 51   Resp: 20 16 (!) 9 11  Temp:  98.3 F (36.8 C) 98.5 F (36.9 C) 97.9 F (36.6 C)  TempSrc: Oral Oral Oral   SpO2: 99% 99% 100% (P) 100%  Weight:      Height:        Wt Readings from Last 3 Encounters:  06/28/19 60 kg  06/20/19 60.9 kg  06/08/19 63.5 kg     Intake/Output Summary (Last 24 hours) at 07/01/2019 1751 Last data filed at 07/01/2019 1750 Gross per 24 hour  Intake 500 ml  Output 450 ml  Net 50 ml   Physical Exam  Gen:- Awake Alert, complains of orthostatic dizziness HEENT:- [place].AT, No sclera icterus Neck-Supple Neck,No JVD,.  Lungs-  CTAB , fair symmetrical air movement CV- S1, S2 normal, regular  Abd-  +ve B.Sounds, Abd Soft, mild epigastric discomfort without rebound or guarding    Extremity/Skin:- No  edema, pedal pulses present  Psych-affect is appropriate, oriented x3 Neuro-orthostatic dizziness, no new focal deficits, no tremors   Data Review:   Micro Results Recent Results (from the past 240 hour(s))  SARS CORONAVIRUS 2 (TAT 6-24 HRS) Nasopharyngeal Nasopharyngeal Swab     Status: None   Collection Time: 06/28/19  5:43 PM   Specimen: Nasopharyngeal Swab  Result Value Ref Range Status   SARS Coronavirus 2 NEGATIVE NEGATIVE Final    Comment: (NOTE) SARS-CoV-2 target nucleic acids are NOT DETECTED. The SARS-CoV-2 RNA is generally detectable in upper and  lower respiratory specimens during the acute phase of infection. Negative results do not preclude SARS-CoV-2 infection, do not rule out co-infections with other pathogens, and should not be used as the sole basis for treatment or other patient management decisions. Negative results must be combined with clinical observations, patient history, and epidemiological information. The expected result is Negative. Fact Sheet for Patients: SugarRoll.be Fact Sheet for Healthcare Providers: https://www.woods-mathews.com/ This test is not yet approved or cleared by the [place] FDA and  has been authorized for detection and/or diagnosis of SARS-CoV-2 by FDA under an Emergency Use Authorization (EUA). This EUA will remain  in effect (meaning this test can be used) for the duration of the COVID-19 declaration under Section 56 4(b)(1) of the Act, 21 U.S.C. section 360bbb-3(b)(1), unless the authorization is terminated or revoked sooner. Performed at [facility], [address] [place], [place] [address]     Radiology Reports DG Chest 1 View  Result Date: 06/20/2019 CLINICAL DATA:  Status post fall with dizziness. EXAM: CHEST  1 VIEW COMPARISON:  March 26, 2019 FINDINGS: The heart size and mediastinal contours are within normal limits. Both lungs are clear. The visualized skeletal structures are unremarkable. IMPRESSION: No active disease. Electronically Signed   By: Abelardo Diesel M.D.   On: 06/20/2019 15:07   CT Head Wo Contrast  Result Date: 06/20/2019 CLINICAL DATA:  Poly trauma. Multiple falls. EXAM: CT HEAD WITHOUT CONTRAST CT CERVICAL SPINE WITHOUT CONTRAST TECHNIQUE: Multidetector CT imaging of the head and cervical spine was performed following the standard protocol without intravenous contrast. Multiplanar CT image reconstructions of the cervical spine were also generated. COMPARISON:  None. FINDINGS: CT HEAD FINDINGS Brain: Generalized  parenchymal volume loss with commensurate dilatation of the ventricles and sulci. No mass, hemorrhage, edema or other evidence of acute parenchymal abnormality. No extra-axial hemorrhage. Vascular: Chronic calcified atherosclerotic changes of the large vessels at the skull base. No unexpected hyperdense vessel. Skull: Normal. Negative for fracture or focal lesion. Sinuses/Orbits: No acute finding. Other: None. CT CERVICAL SPINE FINDINGS Alignment: Normal alignment. No evidence of acute vertebral body subluxation. Skull base and vertebrae: No fracture line or displaced fracture fragment seen. Facet joints are normally aligned throughout. Soft tissues and spinal canal: No prevertebral fluid or swelling. No visible canal hematoma. Disc levels: C2-3 through C4-5 disc spaces are well preserved. Surgical fusion at C5 through C7. No more than mild central canal stenosis at any level. Upper chest: Negative. Other: Bilateral carotid atherosclerosis. Anterior cervical fusion hardware appears intact and appropriately positioned at the C5 through C7 vertebral body levels. IMPRESSION: 1. No acute intracranial abnormality. No intracranial mass, hemorrhage or edema. No skull fracture. 2. No fracture or acute subluxation within the cervical spine. Surgical fusion hardware at C5 through C7 appears intact and appropriately positioned. 3. Carotid atherosclerosis. Electronically Signed   By: Franki Cabot M.D.   On: 06/20/2019 15:12   CT ANGIO CHEST PE W OR WO CONTRAST  Result Date: 06/20/2019 CLINICAL DATA:  Syncope. Elevated D-dimer. EXAM: CT ANGIOGRAPHY CHEST WITH CONTRAST TECHNIQUE: Multidetector CT imaging of the chest was performed using the standard protocol during bolus administration of intravenous contrast. Multiplanar CT image reconstructions and MIPs were obtained to evaluate the vascular anatomy. CONTRAST:  60mL OMNIPAQUE IOHEXOL 350 MG/ML SOLN COMPARISON:  None. FINDINGS: Cardiovascular: Contrast injection is  sufficient to demonstrate satisfactory opacification of the pulmonary arteries to the segmental level. There is no pulmonary embolus. The main pulmonary artery is within normal limits for size. There is no CT evidence of acute right heart strain. There are atherosclerotic changes of the visualized thoracic aorta. There is no evidence for a thoracic aortic aneurysm. Heart size is normal, without pericardial effusion. Mediastinum/Nodes: --No mediastinal or hilar lymphadenopathy. --No axillary lymphadenopathy. --No supraclavicular lymphadenopathy. --Normal thyroid gland. --there is a small hiatal hernia. There are surgical clips near the GE junction. Lungs/Pleura: Emphysematous changes are noted. There is no pneumothorax or pleural effusion. There is no infiltrate. Upper Abdomen: No acute abnormality. Musculoskeletal: No chest wall abnormality. No acute or significant osseous findings. Review of the MIP images confirms the above findings. IMPRESSION: 1. No evidence for acute pulmonary embolism. 2. Emphysematous changes in the lungs. 3. Small hiatal hernia. 4. Aortic Atherosclerosis (ICD10-I70.0) and Emphysema (ICD10-J43.9). Electronically Signed   By: Constance Holster M.D.   On: 06/20/2019 19:53   CT  Cervical Spine Wo Contrast  Result Date: 06/20/2019 CLINICAL DATA:  Poly trauma. Multiple falls. EXAM: CT HEAD WITHOUT CONTRAST CT CERVICAL SPINE WITHOUT CONTRAST TECHNIQUE: Multidetector CT imaging of the head and cervical spine was performed following the standard protocol without intravenous contrast. Multiplanar CT image reconstructions of the cervical spine were also generated. COMPARISON:  None. FINDINGS: CT HEAD FINDINGS Brain: Generalized parenchymal volume loss with commensurate dilatation of the ventricles and sulci. No mass, hemorrhage, edema or other evidence of acute parenchymal abnormality. No extra-axial hemorrhage. Vascular: Chronic calcified atherosclerotic changes of the large vessels at the skull  base. No unexpected hyperdense vessel. Skull: Normal. Negative for fracture or focal lesion. Sinuses/Orbits: No acute finding. Other: None. CT CERVICAL SPINE FINDINGS Alignment: Normal alignment. No evidence of acute vertebral body subluxation. Skull base and vertebrae: No fracture line or displaced fracture fragment seen. Facet joints are normally aligned throughout. Soft tissues and spinal canal: No prevertebral fluid or swelling. No visible canal hematoma. Disc levels: C2-3 through C4-5 disc spaces are well preserved. Surgical fusion at C5 through C7. No more than mild central canal stenosis at any level. Upper chest: Negative. Other: Bilateral carotid atherosclerosis. Anterior cervical fusion hardware appears intact and appropriately positioned at the C5 through C7 vertebral body levels. IMPRESSION: 1. No acute intracranial abnormality. No intracranial mass, hemorrhage or edema. No skull fracture. 2. No fracture or acute subluxation within the cervical spine. Surgical fusion hardware at C5 through C7 appears intact and appropriately positioned. 3. Carotid atherosclerosis. Electronically Signed   By: Franki Cabot M.D.   On: 06/20/2019 15:12   US Carotid Bilateral  Result Date: 06/21/2019 CLINICAL DATA:  Stroke symptoms, hypertension, syncope, visual disturbance and diabetes EXAM: BILATERAL CAROTID DUPLEX ULTRASOUND TECHNIQUE: Pearline Cables scale imaging, color Doppler and duplex ultrasound were performed of bilateral carotid and vertebral arteries in the neck. COMPARISON:  None. FINDINGS: Criteria: Quantification of carotid stenosis is based on velocity parameters that correlate the residual internal carotid diameter with NASCET-based stenosis levels, using the diameter of the distal internal carotid lumen as the denominator for stenosis measurement. The following velocity measurements were obtained: RIGHT ICA: 111/34 cm/sec CCA: 123XX123 cm/sec SYSTOLIC ICA/CCA RATIO:  0.9 ECA: 101 cm/sec LEFT ICA: 119/33 cm/sec CCA:  0000000 cm/sec SYSTOLIC ICA/CCA RATIO:  [id number] ECA: 157 cm/sec RIGHT CAROTID ARTERY: Minor echogenic shadowing plaque formation. No hemodynamically significant right ICA stenosis, velocity elevation, or turbulent flow. Degree of narrowing less than 50%. RIGHT VERTEBRAL ARTERY:  Antegrade LEFT CAROTID ARTERY: Similar scattered minor echogenic plaque formation. No hemodynamically significant left ICA stenosis, velocity elevation, or turbulent flow. LEFT VERTEBRAL ARTERY:  Antegrade IMPRESSION: Minor carotid atherosclerosis. No hemodynamically significant ICA stenosis. Degree of narrowing estimated at less than 50% bilaterally by ultrasound criteria. Patent antegrade vertebral flow bilaterally Electronically Signed   By: Jerilynn Mages.  Shick M.D.   On: 06/21/2019 14:26   DG Knee Complete 4 Views Left  Result Date: 06/20/2019 CLINICAL DATA:  Status post fall with left knee pain. EXAM: LEFT KNEE - COMPLETE 4+ VIEW COMPARISON:  None. FINDINGS: No evidence of fracture, dislocation, or joint effusion. Bipartite patella is noted. Soft tissues are unremarkable. IMPRESSION: No acute fracture or dislocation. Electronically Signed   By: Abelardo Diesel M.D.   On: 06/20/2019 15:05   ECHOCARDIOGRAM COMPLETE  Result Date: 06/21/2019    ECHOCARDIOGRAM REPORT   Patient Name:   DIANIRA RUDD Date of Exam: 06/21/2019 Medical Rec #:  [id number]       Height:  64.0 in Accession #:    [id number]      Weight:       134.3 lb Date of Birth:  08/14/1955       BSA:          1.65 m Patient Age:    61 years        BP:           129/88 mmHg Patient Gender: F               HR:           94 bpm. Exam Location:  [name] Procedure: 2D Echo Indications:    Syncope  History:        Patient has prior history of Echocardiogram examinations, most                 recent 09/15/2012. CAD, COPD; Risk Factors:Hypertension, Diabetes                 and Dyslipidemia.  Sonographer:    Leavy Cella RDCS (AE) Referring Phys: [phone] Royanne Foots [place]  1.  Left ventricular ejection fraction, by estimation, is 65 to 70%. The left ventricle has hyperdynamic function. The left ventricle has no regional wall motion abnormalities. There is mild concentric left ventricular hypertrophy. Left ventricular diastolic parameters are consistent with Grade I diastolic dysfunction (impaired relaxation).  2. Right ventricular systolic function is normal. The right ventricular size is normal.  3. The mitral valve is grossly normal. No evidence of mitral valve regurgitation.  4. The aortic valve is tricuspid. Aortic valve regurgitation is not visualized. No aortic stenosis is present.  5. The inferior vena cava is normal in size with greater than 50% respiratory variability, suggesting right atrial pressure of 3 mmHg. FINDINGS  Left Ventricle: Left ventricular ejection fraction, by estimation, is 65 to 70%. The left ventricle has hyperdynamic function. The left ventricle has no regional wall motion abnormalities. There is mild concentric left ventricular hypertrophy. Left ventricular diastolic parameters are consistent with Grade I diastolic dysfunction (impaired relaxation). Indeterminate filling pressures. Right Ventricle: The right ventricular size is normal. No increase in right ventricular wall thickness. Right ventricular systolic function is normal. Left Atrium: Left atrial size was normal in size. Right Atrium: Right atrial size was normal in size. Pericardium: There is no evidence of pericardial effusion. Mitral Valve: The mitral valve is grossly normal. No evidence of mitral valve regurgitation. Tricuspid Valve: The tricuspid valve is grossly normal. Tricuspid valve regurgitation is not demonstrated. Aortic Valve: The aortic valve is tricuspid. Aortic valve regurgitation is not visualized. No aortic stenosis is present. Pulmonic Valve: The pulmonic valve was not well visualized. Pulmonic valve regurgitation is not visualized. Aorta: The aortic root is normal in size and  structure. Venous: The inferior vena cava is normal in size with greater than 50% respiratory variability, suggesting right atrial pressure of 3 mmHg. IAS/Shunts: No atrial level shunt detected by color flow Doppler.  LEFT VENTRICLE PLAX 2D LVIDd:         3.64 cm  Diastology LVIDs:         1.72 cm  LV e' lateral:   11.00 cm/s LV PW:         1.25 cm  LV E/e' lateral: 4.7 LV IVS:        1.13 cm  LV e' medial:    3.81 cm/s LVOT diam:     2.00 cm  LV E/e' medial:  13.6 LV SV Index:  28.39 LVOT Area:     3.14 cm  RIGHT VENTRICLE RV S prime:     9.68 cm/s TAPSE (M-mode): 1.6 cm LEFT ATRIUM             Index       RIGHT ATRIUM          Index LA diam:        2.60 cm 1.57 cm/m  RA Area:     8.25 cm LA Vol (A2C):   27.4 ml 16.59 ml/m RA Volume:   15.70 ml 9.51 ml/m LA Vol (A4C):   26.6 ml 16.11 ml/m LA Biplane Vol: 29.0 ml 17.56 ml/m   AORTA Ao Root diam: 2.60 cm MITRAL VALVE MV Area (PHT): 2.69 cm    SHUNTS MV Decel Time: 282 msec    Systemic Diam: 2.00 cm MV E velocity: 51.90 cm/s MV A velocity: 79.50 cm/s MV E/A ratio:  0.65 Kate Sable MD Electronically signed by Kate Sable MD Signature Date/Time: 06/21/2019/12:26:50 PM    Final    DG Hips Bilat W or Wo Pelvis 3-4 Views  Result Date: 06/20/2019 CLINICAL DATA:  Status post fall with hip pain. EXAM: DG HIP (WITH OR WITHOUT PELVIS) 3-4V BILAT COMPARISON:  None. FINDINGS: There is no evidence of hip fracture or dislocation. Calcifications are identified over the soft tissues of right hip. IMPRESSION: No acute fracture or dislocation. Electronically Signed   By: Abelardo Diesel M.D.   On: 06/20/2019 15:07     CBC Recent Labs  Lab 06/28/19 1347 06/29/19 0511 06/29/19 1257 06/30/19 0511 07/01/19 0517  WBC 10.4 8.8  --  5.5 6.3  HGB 15.3* 11.3* 10.7* 11.0* 10.4*  HCT 46.9* 35.3* 33.4* 34.7* 33.3*  PLT 284 216  --  189 192  MCV 86.9 89.1  --  89.2 88.8  MCH 28.3 28.5  --  28.3 27.7  MCHC 32.6 32.0  --  31.7 31.2  RDW 12.7 41.7*  --  12.7  12.7  LYMPHSABS 2.0  --   --   --   --   MONOABS 0.9  --   --   --   --   EOSABS 0.1  --   --   --   --   BASOSABS 0.1  --   --   --   --     Chemistries  Recent Labs  Lab 06/28/19 1347 06/28/19 1537 06/29/19 0511 06/30/19 0511 07/01/19 0517  NA 134*  --  135 137 139  K 2.7*  --  3.6 3.7 3.8  CL 88*  --  100 106 109  CO2 30  --  27 25 23   GLUCOSE 323*  --  202* 222* 222*  BUN 25*  --  21 17 15   CREATININE 1.46*  --  1.13* 1.06* 1.09*  CALCIUM 10.2  --  8.6* 9.2 9.1  MG  --  2.6*  --   --   --   AST 33  --   --   --   --   ALT 35  --   --   --   --   ALKPHOS 119  --   --   --   --   BILITOT 1.1  --   --   --   --    ------------------------------------------------------------------------------------------------------------------ No results for input(s): CHOL, HDL, LDLCALC, TRIG, CHOLHDL, LDLDIRECT in the last 72 hours.  Lab Results  Component Value Date   HGBA1C 9.1 (H) 06/28/2019   ------------------------------------------------------------------------------------------------------------------  No results for input(s): TSH, T4TOTAL, T3FREE, THYROIDAB in the last 72 hours.  Invalid input(s): FREET3 ------------------------------------------------------------------------------------------------------------------ No results for input(s): VITAMINB12, FOLATE, FERRITIN, TIBC, IRON, RETICCTPCT in the last 72 hours.  Coagulation profile No results for input(s): INR, PROTIME in the last 168 hours.  No results for input(s): DDIMER in the last 72 hours.  Cardiac Enzymes No results for input(s): CKMB, TROPONINI, MYOGLOBIN in the last 168 hours.  Invalid input(s): CK ------------------------------------------------------------------------------------------------------------------ No results found for: BNP   Roxan Hockey M.D on 07/01/2019 at 5:51 PM  Go to [email or web address] - for contact info  [facility] - Office  [phone]

## 2019-07-02 ENCOUNTER — Encounter (HOSPITAL_COMMUNITY)
Admission: EM | Disposition: A | Discharge status: Home Health | Payer: Self-pay | Source: Ambulatory Visit | Attending: Family Medicine

## 2019-07-02 ENCOUNTER — Inpatient Hospital Stay (HOSPITAL_COMMUNITY): Payer: Medicare Other | Admitting: Anesthesiology

## 2019-07-02 DIAGNOSIS — D123 Benign neoplasm of transverse colon: Secondary | ICD-10-CM

## 2019-07-02 DIAGNOSIS — K5730 Diverticulosis of large intestine without perforation or abscess without bleeding: Secondary | ICD-10-CM

## 2019-07-02 HISTORY — PX: COLONOSCOPY WITH PROPOFOL: SHX5780

## 2019-07-02 HISTORY — PX: HEMOSTASIS CLIP PLACEMENT: SHX6857

## 2019-07-02 HISTORY — PX: POLYPECTOMY: SHX5525

## 2019-07-02 LAB — CBC
HCT: 34.1 % — ABNORMAL LOW (ref 36.0–46.0)
Hemoglobin: 10.9 g/dL — ABNORMAL LOW (ref 12.0–15.0)
MCH: 28.2 pg (ref 26.0–34.0)
MCHC: 32 g/dL (ref 30.0–36.0)
MCV: 88.1 fL (ref 80.0–100.0)
Platelets: 197 10*3/uL (ref 150–400)
RBC: 3.87 MIL/uL (ref 3.87–5.11)
RDW: 12.8 % (ref 11.5–15.5)
WBC: 5.2 10*3/uL (ref 4.0–10.5)
nRBC: 0 % (ref 0.0–0.2)

## 2019-07-02 LAB — GLUCOSE, CAPILLARY
Glucose-Capillary: 105 mg/dL — ABNORMAL HIGH (ref 70–99)
Glucose-Capillary: 123 mg/dL — ABNORMAL HIGH (ref 70–99)
Glucose-Capillary: 138 mg/dL — ABNORMAL HIGH (ref 70–99)
Glucose-Capillary: 145 mg/dL — ABNORMAL HIGH (ref 70–99)

## 2019-07-02 SURGERY — COLONOSCOPY WITH PROPOFOL
Anesthesia: Monitor Anesthesia Care

## 2019-07-02 MED ORDER — PROPRANOLOL HCL ER 60 MG PO CP24: 60.0000 mg | ORAL_CAPSULE | Freq: Every evening | ORAL | 3 refills | Status: DC

## 2019-07-02 MED ORDER — SODIUM CHLORIDE 0.9 % IV SOLN: INTRAVENOUS | Status: DC

## 2019-07-02 MED ORDER — TRAZODONE HCL 100 MG PO TABS: 100.0000 mg | ORAL_TABLET | Freq: Every day | ORAL | 0 refills | Status: DC

## 2019-07-02 MED ORDER — KETAMINE HCL 50 MG/5ML IJ SOSY
PREFILLED_SYRINGE | INTRAMUSCULAR | Status: AC
  Filled 2019-07-02: qty 5

## 2019-07-02 MED ORDER — CLOPIDOGREL BISULFATE 75 MG PO TABS: 75.0000 mg | ORAL_TABLET | Freq: Every day | ORAL | 2 refills | Status: DC

## 2019-07-02 MED ORDER — PROMETHAZINE HCL 25 MG/ML IJ SOLN: 6.2500 mg | INTRAMUSCULAR | Status: DC | PRN

## 2019-07-02 MED ORDER — CLONIDINE HCL 0.1 MG PO TABS: 0.1000 mg | ORAL_TABLET | Freq: Every day | ORAL | 2 refills | Status: DC

## 2019-07-02 MED ORDER — ROSUVASTATIN CALCIUM 40 MG PO TABS: 40.0000 mg | ORAL_TABLET | Freq: Every morning | ORAL | 3 refills | Status: DC

## 2019-07-02 MED ORDER — PROPOFOL 500 MG/50ML IV EMUL
INTRAVENOUS | Status: DC | PRN
  Administered 2019-07-02: 100 ug/kg/min via INTRAVENOUS

## 2019-07-02 MED ORDER — ACETAMINOPHEN 325 MG PO TABS: 650.0000 mg | ORAL_TABLET | Freq: Four times a day (QID) | ORAL | 2 refills | Status: DC | PRN

## 2019-07-02 MED ORDER — LIDOCAINE HCL (CARDIAC) PF 100 MG/5ML IV SOSY
PREFILLED_SYRINGE | INTRAVENOUS | Status: DC | PRN
  Administered 2019-07-02: 80 mg via INTRAVENOUS

## 2019-07-02 MED ORDER — LIDOCAINE 2% (20 MG/ML) 5 ML SYRINGE
INTRAMUSCULAR | Status: AC
  Filled 2019-07-02: qty 5

## 2019-07-02 MED ORDER — KETAMINE HCL 10 MG/ML IJ SOLN
INTRAMUSCULAR | Status: DC | PRN
  Administered 2019-07-02: 20 mg via INTRAVENOUS

## 2019-07-02 MED ORDER — LACTATED RINGERS IV SOLN
INTRAVENOUS | Status: DC
  Administered 2019-07-02: 1000 mL via INTRAVENOUS

## 2019-07-02 MED ORDER — ASPIRIN EC 81 MG PO TBEC: 81.0000 mg | DELAYED_RELEASE_TABLET | Freq: Every day | ORAL | 2 refills | Status: DC

## 2019-07-02 MED ORDER — LOSARTAN POTASSIUM 50 MG PO TABS: 50.0000 mg | ORAL_TABLET | Freq: Every day | ORAL | 3 refills | Status: DC

## 2019-07-02 NOTE — Anesthesia Postprocedure Evaluation (Signed)
Anesthesia Post Note  Patient: KHAMANI DANIELY  Procedure(s) Performed: COLONOSCOPY WITH PROPOFOL (N/A ) POLYPECTOMY HEMOSTASIS CLIP PLACEMENT  Patient location during evaluation: PACU Anesthesia Type: MAC Level of consciousness: awake Pain management: pain level controlled Vital Signs Assessment: post-procedure vital signs reviewed and stable Respiratory status: spontaneous breathing Cardiovascular status: stable Postop Assessment: no apparent nausea or vomiting Anesthetic complications: no     Last Vitals:  Vitals:   07/01/19 2146 07/02/19 1355  BP: 138/65 114/66  Pulse: (!) 44   Resp:  20  Temp: 36.5 C 36.7 C  SpO2: 94% 98%    Last Pain:  Vitals:   07/02/19 1408  TempSrc:   PainSc: 0-No pain                 Everette Rank

## 2019-07-02 NOTE — Progress Notes (Signed)
  Subjective:  Patient continues to complain of mild midepigastric pain.  She has felt bloated since she has been drinking GoLYTELY prep.  She has drank almost all of the prep.  She has not had black stools or bright red blood per rectum.  She is hungry.  Objective: Blood pressure 138/65, pulse (!) 44, temperature 97.7 F (36.5 C), temperature source Axillary, resp. rate 18, height '5\' 4"'$  (1.626 m), weight 60 kg, SpO2 94 %. Patient is alert in no acute distress. Cardiac exam with regular rhythm normal S1 and S2.  No murmur gallop noted. Auscultation lungs reveal vesicular breath sounds bilaterally.  No rales or rhonchi. Abdomen is full.  Bowel sounds are hyperactive.  On palpation is soft with mild midepigastric tenderness.   No peripheral edema or clubbing noted  Labs/studies Results:  CBC Latest Ref Rng & Units 07/02/2019 07/01/2019 06/30/2019  WBC 4.0 - 10.5 K/uL 5.2 6.3 5.5  Hemoglobin 12.0 - 15.0 g/dL 10.9(L) 10.4(L) 11.0(L)  Hematocrit 36.0 - 46.0 % 34.1(L) 33.3(L) 34.7(L)  Platelets 150 - 400 K/uL 197 192 189    CMP Latest Ref Rng & Units 07/01/2019 06/30/2019 06/29/2019  Glucose 70 - 99 mg/dL 222(H) 222(H) 202(H)  BUN 8 - 23 mg/dL '15 17 21  '$ Creatinine 0.44 - 1.00 mg/dL 1.09(H) 1.06(H) 1.13(H)  Sodium 135 - 145 mmol/L 139 137 135  Potassium 3.5 - 5.1 mmol/L 3.8 3.7 3.6  Chloride 98 - 111 mmol/L 109 106 100  CO2 22 - 32 mmol/L '23 25 27  '$ Calcium 8.9 - 10.3 mg/dL 9.1 9.2 8.6(L)  Total Protein 6.5 - 8.1 g/dL - - -  Total Bilirubin 0.3 - 1.2 mg/dL - - -  Alkaline Phos 38 - 126 U/L - - -  AST 15 - 41 U/L - - -  ALT 0 - 44 U/L - - -    Hepatic Function Latest Ref Rng & Units 06/30/2019 06/28/2019 06/20/2019  Total Protein 6.5 - 8.1 g/dL - 8.6(H) 8.5(H)  Albumin 3.5 - 5.0 g/dL 3.1(L) 4.6 4.6  AST 15 - 41 U/L - 33 29  ALT 0 - 44 U/L - 35 33  Alk Phosphatase 38 - 126 U/L - 119 110  Total Bilirubin 0.3 - 1.2 mg/dL - 1.1 1.6(H)  Bilirubin, Direct 0.1 - 0.5 mg/dL - - -      Assessment:  #1.  Melena.  Patient underwent esophagogastroduodenoscopy yesterday no bleeding lesion identified.  She is therefore undergoing colonoscopy later this afternoon.  She was prepped this morning.  No evidence of active GI bleed.  She had significant bleed back in 2013 when she had multiple studies and bleeding source was not pinpointed but was felt she may have bled from diverticulosis.  #2.  Anemia secondary to GI bleed.  Given history of smoking she generally runs hemoglobin 14-15 range.  Hemoglobin dropped to 10.4 g yesterday and now it is creeping back up.  #3.  Diabetes mellitus.  Glucose levels between 105 when 225 in the last 24 hours.  #4.  Peripheral vascular disease.  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  She is on low-dose aspirin and clopidogrel which are on hold.  #5.  History of coronary artery disease.  Being managed medically.  Recommendations  Diagnostic colonoscopy later today under monitored anesthesia care

## 2019-07-02 NOTE — Discharge Instructions (Signed)
1)Avoid ibuprofen/Advil/Aleve/Motrin/Goody Powders/Naproxen/BC powders/Meloxicam/Diclofenac/Indomethacin and other Nonsteroidal anti-inflammatory medications as these will make you more likely to bleed and can cause stomach ulcers, can also cause Kidney problems.   2) hold aspirin and Plavix today, okay to resume aspirin and Plavix over the weekend  3) follow-up with primary care physician for repeat CBC and BMP blood test within a week

## 2019-07-02 NOTE — Progress Notes (Signed)
PT Cancellation Note  Patient Details Name: [name] MRN: [id number] DOB: 1956-01-23   Cancelled Treatment:    Reason Eval/Treat Not Completed: Patient at procedure or test/unavailable.  Patient out for colonoscopy.    3:01 PM, 07/02/19 Lonell Grandchild, MPT Physical Therapist with [facility] [phone] office [phone] mobile phone

## 2019-07-02 NOTE — Progress Notes (Signed)
Brief colonoscopy note  Prep excellent. Exam to cecum. 5 mm polyp cold snare from hepatic flexure.  Single clip applied for hemostasis. Scattered widemouth diverticula at transverse, descending and sigmoid colon. Normal rectum and anorectal junction.

## 2019-07-02 NOTE — TOC Progression Note (Signed)
Transition of Care [facility]) - Progression Note    Patient Details  Name: [name] MRN: [id number] Date of Birth: 10-21-1955  [facility]) CM/SW Contact  Shade Flood, LCSW Phone Number: 07/02/2019, 4:41 PM  Clinical Narrative:     Pt stable for dc per MD. Pt stating she does not have a ride or her keys. Spoke with pt on the phone and she is angry about being discharged. She is asking why she cannot just stay one more night. Provided explanation and pt stated that she would call her cousin and see IF he would come back and get her. Offered cab ride to another residence where she could stay until her cousin is available and pt stated she didn't have anywhere else to go and that her cousin has her keys and he is her ride home. Then she hung up on this LCSW.  Updated MD and RN.  Updated Linda at [place]. They will follow pt for [facility] PT at dc.  Expected Discharge Plan: [place] Barriers to Discharge: Barriers Resolved  Expected Discharge Plan and Services Expected Discharge Plan: [place]         Expected Discharge Date: 07/02/19                         HH Arranged: PT [place]: [place] ([place]) Date HH Agency Contacted: 06/29/19 Time [place]: 1100 Representative spoke with at [place]: Romualdo Bolk   Social Determinants of Health ([employer]) Interventions    Readmission Risk Interventions No flowsheet data found.

## 2019-07-02 NOTE — Progress Notes (Signed)
Spoke to [place], Therapist, sports in Endoscopy / Same Day Procedures. Pt has completed her Go-Lytely and tolerated well. Pt due for her Colonoscopy after 1:30 pm. Resting quietly. No voiced c/o.

## 2019-07-02 NOTE — Anesthesia Preprocedure Evaluation (Signed)
Anesthesia Evaluation  Patient identified by MRN, date of birth, ID band Patient awake    Reviewed: Allergy & Precautions, NPO status , Patient's Chart, lab work & pertinent test results  Airway Mallampati: II  TM Distance: >3 FB Neck ROM: Full    Dental no notable dental hx.    Pulmonary asthma , pneumonia, resolved, Recent URI , former smoker,    Pulmonary exam normal breath sounds clear to auscultation       Cardiovascular Exercise Tolerance: Good hypertension, + CAD, + Past MI and + Peripheral Vascular Disease  Normal cardiovascular examI Rhythm:Regular Rate:Normal     Neuro/Psych  Headaches, Anxiety CVA, No Residual Symptoms negative psych ROS   GI/Hepatic Neg liver ROS, hiatal hernia, PUD, Bowel prep,GERD  Medicated and Controlled,  Endo/Other  negative endocrine ROSdiabetes, Type 1  Renal/GU negative Renal ROS  negative genitourinary   Musculoskeletal  (+) Arthritis , Osteoarthritis,    Abdominal   Peds negative pediatric ROS (+)  Hematology negative hematology ROS (+) anemia ,   Anesthesia Other Findings   Reproductive/Obstetrics negative OB ROS                             Anesthesia Physical Anesthesia Plan  ASA: III  Anesthesia Plan:    Post-op Pain Management:    Induction:   PONV Risk Score and Plan: 2 and Propofol infusion and TIVA  Airway Management Planned: Oral ETT  Additional Equipment:   Intra-op Plan:   Post-operative Plan:   Informed Consent:   Plan Discussed with:   Anesthesia Plan Comments: (Plan Full PPE use  Plan GA with GETA as needed d/w pt -WTP with same after Q&A  Done yesterday - here today for Colonoscopy )        Anesthesia Quick Evaluation

## 2019-07-02 NOTE — Op Note (Signed)
[facility] Patient Name: [name] Procedure Date: 07/02/2019 1:44 PM MRN: [id number] Date of Birth: 1955-09-04 Attending MD: Hildred Laser , MD CSN: [id number] Age: 64 Admit Type: Inpatient Procedure:                Colonoscopy Indications:              Melena Providers:                Hildred Laser, MD, Jeanann Lewandowsky. Sharon Seller, RN, Raphael Gibney, Technician Referring MD:             Roxan Hockey, MD Medicines:                Propofol per Anesthesia Complications:            No immediate complications. Estimated Blood Loss:     Estimated blood loss was minimal. Procedure:                Pre-Anesthesia Assessment:                           - Prior to the procedure, a History and Physical                            was performed, and patient medications and                            allergies were reviewed. The patient's tolerance of                            previous anesthesia was also reviewed. The risks                            and benefits of the procedure and the sedation                            options and risks were discussed with the patient.                            All questions were answered, and informed consent                            was obtained. Prior Anticoagulants: The patient                            last took Plavix (clopidogrel) 1 day prior to the                            procedure. ASA Grade Assessment: III - A patient                            with severe systemic disease. After reviewing the  risks and benefits, the patient was deemed in                            satisfactory condition to undergo the procedure.                           After obtaining informed consent, the colonoscope                            was passed under direct vision. Throughout the                            procedure, the patient's blood pressure, pulse, and                            oxygen saturations  were monitored continuously. The                            [id number]-[id number] scope was introduced through                            the anus and advanced to the the cecum, identified                            by appendiceal orifice and ileocecal valve. The                            colonoscopy was performed without difficulty. The                            patient tolerated the procedure well. The quality                            of the bowel preparation was excellent. The                            ileocecal valve, appendiceal orifice, and rectum                            were photographed. Scope In: 2:14:14 PM Scope Out: 2:36:20 PM Scope Withdrawal Time: 0 hours 10 minutes 59 seconds  Total Procedure Duration: 0 hours 22 minutes 6 seconds  Findings:      The perianal and digital rectal examinations were normal.      A 5 mm polyp was found in the hepatic flexure. The polyp was removed       with a cold snare. Resection and retrieval were complete.      Scattered large-mouthed diverticula were found in the sigmoid colon,       descending colon and transverse colon.      The exam was otherwise normal throughout the examined colon.      The retroflexed view of the distal rectum and anal verge was normal and       showed no anal or rectal abnormalities. Impression:               -  One 5 mm polyp at the hepatic flexure, removed                            with a cold snare. Resected and retrieved.                           - Diverticulosis in the sigmoid colon, in the                            descending colon and in the transverse colon. Moderate Sedation:      Per Anesthesia Care Recommendation:           - Return patient to hospital ward for ongoing care.                           - Diabetic (ADA) diet today.                           - Continue present medications.                           - Resume Aspirin and Plavix (clopidogrel) tomorrow                            at  prior dose.                           - Await pathology results.                           - CBC in am.                           - No MRI until clip passed. Procedure Code(s):        --- Professional ---                           [phone], Colonoscopy, flexible; with removal of                            tumor(s), polyp(s), or other lesion(s) by snare                            technique Diagnosis Code(s):        --- Professional ---                           K63.5, Polyp of colon                           K92.1, Melena (includes Hematochezia)                           K57.30, Diverticulosis of large intestine without                            perforation or abscess without bleeding  CPT copyright 2019 [employer]. All rights reserved. The codes documented in this report are preliminary and upon coder review may  be revised to meet current compliance requirements. Hildred Laser, MD Hildred Laser, MD 07/02/2019 2:45:46 PM This report has been signed electronically. Number of Addenda: 0

## 2019-07-02 NOTE — Transfer of Care (Signed)
Immediate Anesthesia Transfer of Care Note  Patient: [name]  Procedure(s) Performed: COLONOSCOPY WITH PROPOFOL (N/A ) POLYPECTOMY HEMOSTASIS CLIP PLACEMENT  Patient Location: PACU  Anesthesia Type:MAC  Level of Consciousness: awake and patient cooperative  Airway & Oxygen Therapy: Patient Spontanous Breathing  Post-op Assessment: Report given to RN and Post -op Vital signs reviewed and stable  Post vital signs: Reviewed and stable  Last Vitals:  Vitals Value Taken Time  BP    Temp    Pulse    Resp    SpO2      Last Pain:  Vitals:   07/02/19 1408  TempSrc:   PainSc: 0-No pain      Patients Stated Pain Goal: 7 ([id number])  Complications: No apparent anesthesia complications

## 2019-07-02 NOTE — Discharge Summary (Signed)
[name], is a 64 y.o. female  DOB 07/31/1955  MRN [id number].  Admission date:  06/28/2019  Admitting Physician  Roxan Hockey, MD  Discharge Date:  07/02/2019   Primary MD  [name], MD  Recommendations for primary care physician for things to follow:   1)Avoid ibuprofen/Advil/Aleve/Motrin/Goody Powders/Naproxen/BC powders/Meloxicam/Diclofenac/Indomethacin and other Nonsteroidal anti-inflammatory medications as these will make you more likely to bleed and can cause stomach ulcers, can also cause Kidney problems.   2)Hold aspirin and Plavix today, okay to resume aspirin and Plavix over the weekend  3) follow-up with primary care physician for repeat CBC and BMP blood test within a week  Admission Diagnosis  Hypokalemia [E87.6] Dizziness [R42] Hyperglycemia [R73.9] Near syncope [R55] GI bleed [K92.2]   Discharge Diagnosis  Hypokalemia [E87.6] Dizziness [R42] Hyperglycemia [R73.9] Near syncope [R55] GI bleed [K92.2]    Active Problems:   GI bleed   Diabetic gastroparesis associated with type 2 diabetes mellitus (HCC)   DM2 (diabetes mellitus, type 2) (HCC)   Hyperlipidemia LDL goal <70   Essential hypertension   Hypokalemia   Generalized weakness      Past Medical History:  Diagnosis Date  . 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 Anemia    hx  . Angioedema 07/10/2015  . Anxiety   . Arthritis    "neck, left hand" (09/14/2012)  . Asthma   . Cancer ([place]) 1985   ovarian, no treatment except surgery  . Complication of anesthesia    OCCASIONAL TROUBLE TURNING NECK TO RIGHT  . Critical lower limb ischemia    10/2014 s/p L SFA stenting  . Diverticulosis of colon with hemorrhage April 2013  . GERD (gastroesophageal reflux disease)   . H/O hiatal hernia   . Heart attack [facility])    2003 mild MI, March 2013 mild MI  . Hidradenitis    groin  . History of blood transfusion 1985 AND 2013    . Hyperlipidemia   . Hypertension   . Irritable bowel syndrome   . Left-sided weakness    since stroke, left eye trouble seeing  . Migraines   . Mild CAD    a. Cath 09/2010: mild luminal irregularities of LAD, 30% prox RCA and 20-30% mRCA, EF 65%.  . Neuropathy   . Obesity   . PAD (peripheral artery disease) ([place])    a. critical limb ischemia s/p PTA/stenting of L SFA 10/2014. c. occ prior SFA stent by angio 01/2016, for possible PV bypass.  . Pneumonia    baby  . Recurrent upper respiratory infection (URI)   . S/P arterial stent-mid Lt SFA 11/03/14 11/04/2014  . Schatzki's ring   . Sinus problem   . Stomach ulcer 1972   non-bleeding  . Stroke ([place]) 07-2007, 07-2008, 07-2009   total 3 strokes; mild left sided weakness and left eye "jumps".  . Type II diabetes mellitus ([place])   . Vitamin B 12 deficiency 07-15-2013    Past Surgical History:  Procedure Laterality Date  . ABDOMINAL HYSTERECTOMY  1985  .  ADENOIDECTOMY    . ANTERIOR CERVICAL DECOMP/DISCECTOMY FUSION  2002  . ANTERIOR CERVICAL DECOMP/DISCECTOMY FUSION N/A 04/08/2014   Procedure: Cervical Six-Seven ANTERIOR CERVICAL DECOMPRESSION/DISCECTOMY FUSION Plating and Bonegraft  2 LEVELS;  Surgeon: Ashok Pall, MD;  Location: [place] NEURO ORS;  Service: Neurosurgery;  Laterality: N/A;  Cervical Six-Seven ANTERIOR CERVICAL DECOMPRESSION/DISCECTOMY FUSION Plating and Bonegraft  2 LEVELS  . APPENDECTOMY  1985  . AXILLARY HIDRADENITIS EXCISION  1990-2008   bilateral  . BACK SURGERY    . BREAST BIOPSY Right 2007  . BREAST CYST EXCISION Right 2008  . BREAST REDUCTION SURGERY    . CARDIAC CATHETERIZATION  2004   mild disease  . CATARACT EXTRACTION W/PHACO Left 05/16/2015   Procedure: CATARACT EXTRACTION PHACO AND INTRAOCULAR LENS PLACEMENT (IOC);  Surgeon: Rutherford Guys, MD;  Location: [facility] ORS;  Service: Ophthalmology;  Laterality: Left;  CDE: 4.24  . CATARACT EXTRACTION W/PHACO Right 05/30/2015   Procedure: CATARACT EXTRACTION RIGHT EYE PHACO  AND INTRAOCULAR LENS PLACEMENT ;  Surgeon: Rutherford Guys, MD;  Location: [facility] ORS;  Service: Ophthalmology;  Laterality: Right;  CDE:4.08  . CHOLECYSTECTOMY  1990's  . COLONOSCOPY  08/12/2011   Procedure: COLONOSCOPY;  Surgeon: Ladene Artist, MD,FACG;  Location: [facility] ENDOSCOPY;  Service: Endoscopy;  Laterality: N/A;  . COLONOSCOPY  06/19/2006  . cyst thigh Right   . ESOPHAGOGASTRODUODENOSCOPY  08/12/2011   Procedure: ESOPHAGOGASTRODUODENOSCOPY (EGD);  Surgeon: Ladene Artist, MD,FACG;  Location: [facility] ENDOSCOPY;  Service: Endoscopy;  Laterality: N/A;  . ESOPHAGOGASTRODUODENOSCOPY  06/04/2005  . ESOPHAGOGASTRODUODENOSCOPY (EGD) WITH PROPOFOL N/A 07/01/2019   Procedure: ESOPHAGOGASTRODUODENOSCOPY (EGD) WITH PROPOFOL;  Surgeon: Rogene Houston, MD;  Location: [facility]O [facility];  Service: Endoscopy;  Laterality: N/A;  . FEMORAL-POPLITEAL BYPASS GRAFT Left 06/19/2016   Procedure: Left Leg BYPASS GRAFT FEMORAL-POPLITEAL ARTERY;  Surgeon: Serafina Mitchell, MD;  Location: [place];  Service: Vascular;  Laterality: Left;  . GIVENS CAPSULE STUDY  08/13/2011   Procedure: GIVENS CAPSULE STUDY;  Surgeon: Ladene Artist, MD,FACG;  Location: [facility] ENDOSCOPY;  Service: Endoscopy;  Laterality: N/A;  . HAMMER TOE SURGERY Bilateral ~ 2000  . HIATAL HERNIA REPAIR    . HYDRADENITIS EXCISION  01/2011; 03/2012   'groin and abdomen; 03/2012" (09/14/2012)  . HYDRADENITIS EXCISION  04/01/2012   Procedure: EXCISION HYDRADENITIS GROIN;  Surgeon: Pedro Earls, MD;  Location: [facility] ORS;  Service: General;  Laterality: Bilateral;  Excision of Hydradenitis of Perineum  . HYDRADENITIS EXCISION N/A 09/17/2013   Procedure: EXCISION PERINEAL HIDRADENITIS ;  Surgeon: Pedro Earls, MD;  Location: [facility] ORS;  Service: General;  Laterality: N/A;  also in the pubis area  . LEFT HEART CATH AND CORONARY ANGIOGRAPHY N/A 12/26/2016   Procedure: LEFT HEART CATH AND CORONARY ANGIOGRAPHY;  Surgeon: [name], Peter M, MD;  Location: [place] CV LAB;  Service:  Cardiovascular;  Laterality: N/A;  . MASS EXCISION Right 09/17/2013   Procedure: EXCISION MASS;  Surgeon: Pedro Earls, MD;  Location: [facility] ORS;  Service: General;  Laterality: Right;  . NISSEN FUNDOPLICATION  [id number]  . PERIPHERAL VASCULAR CATHETERIZATION N/A 11/03/2014   Procedure: Lower Extremity Angiography;  Surgeon: Lorretta Harp, MD;  Location: [place] CV LAB;  Service: Cardiovascular;  Laterality: N/A;  . PERIPHERAL VASCULAR CATHETERIZATION N/A 01/29/2016   Procedure: Lower Extremity Angiography;  Surgeon: Lorretta Harp, MD;  Location: [place] CV LAB;  Service: Cardiovascular;  Laterality: N/A;  . POSTERIOR LUMBAR FUSION  2008 X 2  . REDUCTION MAMMAPLASTY  1996?  Marland Kitchen  [name] AND 2000  . UVULOPALATOPHARYNGOPLASTY, TONSILLECTOMY AND SEPTOPLASTY  2000's    HPI  from the history and physical done on the day of admission:    [name]  is a 64 y.o. female, with medical history significant forhypertension, diabetes, CAD with prior MI, prior history of CVA with residual left-sided weakness, dyslipidemia, anxiety, CKD stage IIIb, and peripheral vascular disease with prior stenting who typically ambulates with a walker, patient with recent hospitalization secondary to syncope, hypokalemia, discharged 2/15 with Holter monitor, patient presents to PCP office today, she was noted to have elevated blood sugar, dehydration, and tachycardia,(ports of heart rate elevated to 140), for some dizziness, lightheadedness, and some nausea, patient had Holter monitor arranged upon recent discharge, she denies any chest pain, shortness of breath, fever, chills, she does still reports dizziness, lightheadedness, and vertigo at baseline. - in ED patient with mild sinus tachycardia heart rate in the upper 90s, she was orthostatic systolic [id number] upon standing, 115 upon sitting, work-up significant for hypokalemia at 2.7 with a creatinine of 1.46, I was called to admit patient for  her hypokalemia and orthostasis.   Hospital Course:   Brief Summary 64 y.o.female,with medical history significant forhypertension, diabetes, CAD with prior MI, prior history of CVA with residual left-sided weakness, dyslipidemia, anxiety, CKD stage IIIb, and peripheral vascular disease with prior stenting who typically ambulates with a walker,patient with recent hospitalization secondary to syncope, hypokalemia, discharged 2/15 with Holter monitor,patient presents to PCP office today, she was noted to have elevated blood sugar, dehydration, and tachycardia,(heart rate elevated to 140),for some dizziness, lightheadedness, and some nausea, patient had Holter monitor arranged upon recent discharge admitted on 06/28/2019 with orthostatic dizziness and tachycardia and now having black stools with concerns about GI bleed   A/p 1)Possible acute blood loss Anemia-- Possible  GI source -hemoglobin down to 10.8from 15.3 on 06/28/2019 -Patient with dark stools and history of ibuprofen and naproxen use -ContinueProtonix,positivestool occult bloodnoted, -Underwent EGD on 07/01/2019 try to determine source of GI blood loss/acute blood loss anemia especially given that patient is on aspirin Plavix and has used NSAIDs recently -EGD unrevealing,   colonoscopy on 07/02/2019 w/o explanation for heme positive stool -c/n to resume  aspirin and Plavix for history of PAD and CAD  2) sinus tachycardia and orthostatic dizziness--- suspect due to ABLA--management as above #1--- completely resolved, -BP, and heart rate are stable, patient ambulating without dizziness or dyspnea on exertion  3) DM2-A1c is 9.4 reflecting uncontrolled DM---PTA patient was on insulin -Lifestyle advice medication advised, follow-up with PCP for adjustment of diabetic insulin regimen   4) hypertension--- stop chlorthalidone  due to hypokalemia, continue losartan, propanolol ER and clonidine  5) generalized weakness and  deconditioning--- PT eval appreciated recommends home health physical therapy  Disposition----Home with home health PT  Code Status : Full  Family Communication:   NA (patient is alert, awake and coherent)  Consults  :  Gi  Procedures:- EGD on 07/01/2019 unrevealing,  -Colonoscopy on 06/29/2019 without acute findings  Discharge Condition: Stable  Follow UP  Follow-up Information    [facility] Follow [facility].   Specialty: Home Health Services Why:   PT           Consults obtained -GI  Diet and Activity recommendation:  As advised  Discharge Instructions    Discharge Instructions    Call MD for:  difficulty breathing, headache or visual disturbances   Complete by: As directed  Call MD for:  persistant dizziness or light-headedness   Complete by: As directed    Call MD for:  persistant nausea and vomiting   Complete by: As directed    Call MD for:  severe uncontrolled pain   Complete by: As directed    Call MD for:  temperature >100.4   Complete by: As directed    Diet - low sodium heart healthy   Complete by: As directed    Discharge instructions   Complete by: As directed    1)Avoid ibuprofen/Advil/Aleve/Motrin/Goody Powders/Naproxen/BC powders/Meloxicam/Diclofenac/Indomethacin and other Nonsteroidal anti-inflammatory medications as these will make you more likely to bleed and can cause stomach ulcers, can also cause Kidney problems.   2) hold aspirin and Plavix today, okay to resume aspirin and Plavix over the weekend  3) follow-up with primary care physician for repeat CBC and BMP blood test within a week   Increase activity slowly   Complete by: As directed         Discharge Medications     Allergies as of 07/02/2019      Reactions   Sulfa Antibiotics Shortness Of Breath, Palpitations   Codeine Other (See Comments)   Recovering Addict does not like to take Narcotics   Fish Allergy Hives, Swelling   Tongue swelling   Iodine  Swelling   Metformin And Related Diarrhea   Soliqua [insulin Glargine-lixisenatide] Itching, Other (See Comments)   "tongue swelling"   Adhesive [tape] Rash   Paper tape is ok   Shellfish Allergy Swelling, Rash   Tongue swelling      Medication List    STOP taking these medications   chlorthalidone 25 MG tablet Commonly known as: HYGROTON   naproxen 375 MG tablet Commonly known as: NAPROSYN   potassium chloride 10 MEQ tablet Commonly known as: KLOR-CON     TAKE these medications   acetaminophen 325 MG tablet Commonly known as: TYLENOL Take 2 tablets (650 mg total) by mouth every 6 (six) hours as needed for mild pain (or Fever >/= 101).   albuterol 108 (90 Base) MCG/ACT inhaler Commonly known as: ProAir HFA 1-2 inhalations every 4-6 hours as needed for cough or wheeze. What changed:   how much to take  how to take this  when to take this  reasons to take this  additional instructions   ALPRAZolam 0.5 MG tablet Commonly known as: XANAX Take 0.5 mg by mouth 2 (two) times daily as needed for anxiety.   aspirin EC 81 MG tablet Take 1 tablet (81 mg total) by mouth daily with breakfast. Start taking on: July 03, 2019 What changed: when to take this   Azelastine HCl 0.15 % Soln Place 2 sprays into both nostrils 2 (two) times daily. What changed:   when to take this  reasons to take this   CeleXA 10 MG tablet Generic drug: citalopram Take 10 mg by mouth daily as needed.   cloNIDine 0.1 MG tablet Commonly known as: CATAPRES Take 1 tablet (0.1 mg total) by mouth at bedtime.   clopidogrel 75 MG tablet Commonly known as: PLAVIX Take 1 tablet (75 mg total) by mouth daily. Start taking on: July 03, 2019 What changed: See the new instructions.   EPINEPHrine 0.3 mg/0.3 mL Soaj injection Commonly known as: EPI-PEN Inject 0.3 mLs (0.3 mg total) into the muscle once. What changed:   when to take this  reasons to take this     HYDROcodone-acetaminophen 7.5-325 MG tablet Commonly known as: NORCO Take 1  tablet by mouth daily as needed for moderate pain.   insulin lispro 100 UNIT/ML injection Commonly known as: HUMALOG Inject 5-12 Units into the skin 2 (two) times daily before a meal. Per sliding scale using OMNIPOD   losartan 50 MG tablet Commonly known as: COZAAR Take 1 tablet (50 mg total) by mouth daily.   mupirocin ointment 2 % Commonly known as: BACTROBAN Apply 1 application topically 2 (two) times daily. What changed:   when to take this  reasons to take this   nitroGLYCERIN 0.4 MG SL tablet Commonly known as: NITROSTAT PLACE 1 TAB UNDER TONGUE EVERY 5 MINS AS NEEDED FOR CHEST PAIN - MAX 3 DOSES THEN 911 What changed:   how much to take  how to take this  when to take this  reasons to take this  additional instructions   pantoprazole 40 MG tablet Commonly known as: PROTONIX TAKE 1 TABLET BY MOUTH EVERY DAY   propranolol ER 60 MG 24 hr capsule Commonly known as: INDERAL LA Take 1 capsule (60 mg total) by mouth every evening.   rosuvastatin 40 MG tablet Commonly known as: CRESTOR Take 1 tablet (40 mg total) by mouth every morning.   SUMAtriptan 100 MG tablet Commonly known as: IMITREX Take 100 mg by mouth every 2 (two) hours as needed for migraine.   temazepam 30 MG capsule Commonly known as: RESTORIL Take 30 mg by mouth at bedtime as needed for sleep.   traZODone 100 MG tablet Commonly known as: DESYREL Take 1 tablet (100 mg total) by mouth at bedtime.       Major procedures and Radiology Reports - PLEASE review detailed and final reports for all details, in brief -   DG Chest 1 View  Result Date: 06/20/2019 CLINICAL DATA:  Status post fall with dizziness. EXAM: CHEST  1 VIEW COMPARISON:  March 26, 2019 FINDINGS: The heart size and mediastinal contours are within normal limits. Both lungs are clear. The visualized skeletal structures are unremarkable. IMPRESSION:  No active disease. Electronically Signed   By: Abelardo Diesel M.D.   On: 06/20/2019 15:07   CT Head Wo Contrast  Result Date: 06/20/2019 CLINICAL DATA:  Poly trauma. Multiple falls. EXAM: CT HEAD WITHOUT CONTRAST CT CERVICAL SPINE WITHOUT CONTRAST TECHNIQUE: Multidetector CT imaging of the head and cervical spine was performed following the standard protocol without intravenous contrast. Multiplanar CT image reconstructions of the cervical spine were also generated. COMPARISON:  None. FINDINGS: CT HEAD FINDINGS Brain: Generalized parenchymal volume loss with commensurate dilatation of the ventricles and sulci. No mass, hemorrhage, edema or other evidence of acute parenchymal abnormality. No extra-axial hemorrhage. Vascular: Chronic calcified atherosclerotic changes of the large vessels at the skull base. No unexpected hyperdense vessel. Skull: Normal. Negative for fracture or focal lesion. Sinuses/Orbits: No acute finding. Other: None. CT CERVICAL SPINE FINDINGS Alignment: Normal alignment. No evidence of acute vertebral body subluxation. Skull base and vertebrae: No fracture line or displaced fracture fragment seen. Facet joints are normally aligned throughout. Soft tissues and spinal canal: No prevertebral fluid or swelling. No visible canal hematoma. Disc levels: C2-3 through C4-5 disc spaces are well preserved. Surgical fusion at C5 through C7. No more than mild central canal stenosis at any level. Upper chest: Negative. Other: Bilateral carotid atherosclerosis. Anterior cervical fusion hardware appears intact and appropriately positioned at the C5 through C7 vertebral body levels. IMPRESSION: 1. No acute intracranial abnormality. No intracranial mass, hemorrhage or edema. No skull fracture. 2. No fracture or acute subluxation  within the cervical spine. Surgical fusion hardware at C5 through C7 appears intact and appropriately positioned. 3. Carotid atherosclerosis. Electronically Signed   By: Franki Cabot  M.D.   On: 06/20/2019 15:12   CT ANGIO CHEST PE W OR WO CONTRAST  Result Date: 06/20/2019 CLINICAL DATA:  Syncope. Elevated D-dimer. EXAM: CT ANGIOGRAPHY CHEST WITH CONTRAST TECHNIQUE: Multidetector CT imaging of the chest was performed using the standard protocol during bolus administration of intravenous contrast. Multiplanar CT image reconstructions and MIPs were obtained to evaluate the vascular anatomy. CONTRAST:  35mL OMNIPAQUE IOHEXOL 350 MG/ML SOLN COMPARISON:  None. FINDINGS: Cardiovascular: Contrast injection is sufficient to demonstrate satisfactory opacification of the pulmonary arteries to the segmental level. There is no pulmonary embolus. The main pulmonary artery is within normal limits for size. There is no CT evidence of acute right heart strain. There are atherosclerotic changes of the visualized thoracic aorta. There is no evidence for a thoracic aortic aneurysm. Heart size is normal, without pericardial effusion. Mediastinum/Nodes: --No mediastinal or hilar lymphadenopathy. --No axillary lymphadenopathy. --No supraclavicular lymphadenopathy. --Normal thyroid gland. --there is a small hiatal hernia. There are surgical clips near the GE junction. Lungs/Pleura: Emphysematous changes are noted. There is no pneumothorax or pleural effusion. There is no infiltrate. Upper Abdomen: No acute abnormality. Musculoskeletal: No chest wall abnormality. No acute or significant osseous findings. Review of the MIP images confirms the above findings. IMPRESSION: 1. No evidence for acute pulmonary embolism. 2. Emphysematous changes in the lungs. 3. Small hiatal hernia. 4. Aortic Atherosclerosis (ICD10-I70.0) and Emphysema (ICD10-J43.9). Electronically Signed   By: Constance Holster M.D.   On: 06/20/2019 19:53   CT Cervical Spine Wo Contrast  Result Date: 06/20/2019 CLINICAL DATA:  Poly trauma. Multiple falls. EXAM: CT HEAD WITHOUT CONTRAST CT CERVICAL SPINE WITHOUT CONTRAST TECHNIQUE: Multidetector CT  imaging of the head and cervical spine was performed following the standard protocol without intravenous contrast. Multiplanar CT image reconstructions of the cervical spine were also generated. COMPARISON:  None. FINDINGS: CT HEAD FINDINGS Brain: Generalized parenchymal volume loss with commensurate dilatation of the ventricles and sulci. No mass, hemorrhage, edema or other evidence of acute parenchymal abnormality. No extra-axial hemorrhage. Vascular: Chronic calcified atherosclerotic changes of the large vessels at the skull base. No unexpected hyperdense vessel. Skull: Normal. Negative for fracture or focal lesion. Sinuses/Orbits: No acute finding. Other: None. CT CERVICAL SPINE FINDINGS Alignment: Normal alignment. No evidence of acute vertebral body subluxation. Skull base and vertebrae: No fracture line or displaced fracture fragment seen. Facet joints are normally aligned throughout. Soft tissues and spinal canal: No prevertebral fluid or swelling. No visible canal hematoma. Disc levels: C2-3 through C4-5 disc spaces are well preserved. Surgical fusion at C5 through C7. No more than mild central canal stenosis at any level. Upper chest: Negative. Other: Bilateral carotid atherosclerosis. Anterior cervical fusion hardware appears intact and appropriately positioned at the C5 through C7 vertebral body levels. IMPRESSION: 1. No acute intracranial abnormality. No intracranial mass, hemorrhage or edema. No skull fracture. 2. No fracture or acute subluxation within the cervical spine. Surgical fusion hardware at C5 through C7 appears intact and appropriately positioned. 3. Carotid atherosclerosis. Electronically Signed   By: Franki Cabot M.D.   On: 06/20/2019 15:12   US Carotid Bilateral  Result Date: 06/21/2019 CLINICAL DATA:  Stroke symptoms, hypertension, syncope, visual disturbance and diabetes EXAM: BILATERAL CAROTID DUPLEX ULTRASOUND TECHNIQUE: Pearline Cables scale imaging, color Doppler and duplex ultrasound  were performed of bilateral carotid and vertebral arteries in the neck.  COMPARISON:  None. FINDINGS: Criteria: Quantification of carotid stenosis is based on velocity parameters that correlate the residual internal carotid diameter with NASCET-based stenosis levels, using the diameter of the distal internal carotid lumen as the denominator for stenosis measurement. The following velocity measurements were obtained: RIGHT ICA: 111/34 cm/sec CCA: 123XX123 cm/sec SYSTOLIC ICA/CCA RATIO:  0.9 ECA: 101 cm/sec LEFT ICA: 119/33 cm/sec CCA: 0000000 cm/sec SYSTOLIC ICA/CCA RATIO:  [id number] ECA: 157 cm/sec RIGHT CAROTID ARTERY: Minor echogenic shadowing plaque formation. No hemodynamically significant right ICA stenosis, velocity elevation, or turbulent flow. Degree of narrowing less than 50%. RIGHT VERTEBRAL ARTERY:  Antegrade LEFT CAROTID ARTERY: Similar scattered minor echogenic plaque formation. No hemodynamically significant left ICA stenosis, velocity elevation, or turbulent flow. LEFT VERTEBRAL ARTERY:  Antegrade IMPRESSION: Minor carotid atherosclerosis. No hemodynamically significant ICA stenosis. Degree of narrowing estimated at less than 50% bilaterally by ultrasound criteria. Patent antegrade vertebral flow bilaterally Electronically Signed   By: Jerilynn Mages.  Shick M.D.   On: 06/21/2019 14:26   DG Knee Complete 4 Views Left  Result Date: 06/20/2019 CLINICAL DATA:  Status post fall with left knee pain. EXAM: LEFT KNEE - COMPLETE 4+ VIEW COMPARISON:  None. FINDINGS: No evidence of fracture, dislocation, or joint effusion. Bipartite patella is noted. Soft tissues are unremarkable. IMPRESSION: No acute fracture or dislocation. Electronically Signed   By: Abelardo Diesel M.D.   On: 06/20/2019 15:05   ECHOCARDIOGRAM COMPLETE  Result Date: 06/21/2019    ECHOCARDIOGRAM REPORT   Patient Name:   ELDRED LUFF Date of Exam: 06/21/2019 Medical Rec #:  [id number]       Height:       64.0 in Accession #:    [id number]      Weight:        134.3 lb Date of Birth:  20-Jul-1955       BSA:          1.65 m Patient Age:    37 years        BP:           129/88 mmHg Patient Gender: F               HR:           94 bpm. Exam Location:  [name] Procedure: 2D Echo Indications:    Syncope  History:        Patient has prior history of Echocardiogram examinations, most                 recent 09/15/2012. CAD, COPD; Risk Factors:Hypertension, Diabetes                 and Dyslipidemia.  Sonographer:    Leavy Cella RDCS (AE) Referring Phys: [phone] Royanne Foots [place]  1. Left ventricular ejection fraction, by estimation, is 65 to 70%. The left ventricle has hyperdynamic function. The left ventricle has no regional wall motion abnormalities. There is mild concentric left ventricular hypertrophy. Left ventricular diastolic parameters are consistent with Grade I diastolic dysfunction (impaired relaxation).  2. Right ventricular systolic function is normal. The right ventricular size is normal.  3. The mitral valve is grossly normal. No evidence of mitral valve regurgitation.  4. The aortic valve is tricuspid. Aortic valve regurgitation is not visualized. No aortic stenosis is present.  5. The inferior vena cava is normal in size with greater than 50% respiratory variability, suggesting right atrial pressure of 3 mmHg. FINDINGS  Left Ventricle: Left ventricular ejection fraction, by estimation,  is 65 to 70%. The left ventricle has hyperdynamic function. The left ventricle has no regional wall motion abnormalities. There is mild concentric left ventricular hypertrophy. Left ventricular diastolic parameters are consistent with Grade I diastolic dysfunction (impaired relaxation). Indeterminate filling pressures. Right Ventricle: The right ventricular size is normal. No increase in right ventricular wall thickness. Right ventricular systolic function is normal. Left Atrium: Left atrial size was normal in size. Right Atrium: Right atrial size was normal in size.  Pericardium: There is no evidence of pericardial effusion. Mitral Valve: The mitral valve is grossly normal. No evidence of mitral valve regurgitation. Tricuspid Valve: The tricuspid valve is grossly normal. Tricuspid valve regurgitation is not demonstrated. Aortic Valve: The aortic valve is tricuspid. Aortic valve regurgitation is not visualized. No aortic stenosis is present. Pulmonic Valve: The pulmonic valve was not well visualized. Pulmonic valve regurgitation is not visualized. Aorta: The aortic root is normal in size and structure. Venous: The inferior vena cava is normal in size with greater than 50% respiratory variability, suggesting right atrial pressure of 3 mmHg. IAS/Shunts: No atrial level shunt detected by color flow Doppler.  LEFT VENTRICLE PLAX 2D LVIDd:         3.64 cm  Diastology LVIDs:         1.72 cm  LV e' lateral:   11.00 cm/s LV PW:         1.25 cm  LV E/e' lateral: 4.7 LV IVS:        1.13 cm  LV e' medial:    3.81 cm/s LVOT diam:     2.00 cm  LV E/e' medial:  13.6 LV SV Index:   28.39 LVOT Area:     3.14 cm  RIGHT VENTRICLE RV S prime:     9.68 cm/s TAPSE (M-mode): 1.6 cm LEFT ATRIUM             Index       RIGHT ATRIUM          Index LA diam:        2.60 cm 1.57 cm/m  RA Area:     8.25 cm LA Vol (A2C):   27.4 ml 16.59 ml/m RA Volume:   15.70 ml 9.51 ml/m LA Vol (A4C):   26.6 ml 16.11 ml/m LA Biplane Vol: 29.0 ml 17.56 ml/m   AORTA Ao Root diam: 2.60 cm MITRAL VALVE MV Area (PHT): 2.69 cm    SHUNTS MV Decel Time: 282 msec    Systemic Diam: 2.00 cm MV E velocity: 51.90 cm/s MV A velocity: 79.50 cm/s MV E/A ratio:  0.65 Kate Sable MD Electronically signed by Kate Sable MD Signature Date/Time: 06/21/2019/12:26:50 PM    Final    DG Hips Bilat W or Wo Pelvis 3-4 Views  Result Date: 06/20/2019 CLINICAL DATA:  Status post fall with hip pain. EXAM: DG HIP (WITH OR WITHOUT PELVIS) 3-4V BILAT COMPARISON:  None. FINDINGS: There is no evidence of hip fracture or dislocation.  Calcifications are identified over the soft tissues of right hip. IMPRESSION: No acute fracture or dislocation. Electronically Signed   By: Abelardo Diesel M.D.   On: 06/20/2019 15:07    Micro Results    Recent Results (from the past 240 hour(s))  SARS CORONAVIRUS 2 (TAT 6-24 HRS) Nasopharyngeal Nasopharyngeal Swab     Status: None   Collection Time: 06/28/19  5:43 PM   Specimen: Nasopharyngeal Swab  Result Value Ref Range Status   SARS Coronavirus 2 NEGATIVE NEGATIVE Final  Comment: (NOTE) SARS-CoV-2 target nucleic acids are NOT DETECTED. The SARS-CoV-2 RNA is generally detectable in upper and lower respiratory specimens during the acute phase of infection. Negative results do not preclude SARS-CoV-2 infection, do not rule out co-infections with other pathogens, and should not be used as the sole basis for treatment or other patient management decisions. Negative results must be combined with clinical observations, patient history, and epidemiological information. The expected result is Negative. Fact Sheet for Patients: SugarRoll.be Fact Sheet for Healthcare Providers: https://www.woods-mathews.com/ This test is not yet approved or cleared by the [place] FDA and  has been authorized for detection and/or diagnosis of SARS-CoV-2 by FDA under an Emergency Use Authorization (EUA). This EUA will remain  in effect (meaning this test can be used) for the duration of the COVID-19 declaration under Section 56 4(b)(1) of the Act, 21 U.S.C. section 360bbb-3(b)(1), unless the authorization is terminated or revoked sooner. Performed at [facility], [place] [address] [place], [place] [address]        Today   Subjective    Santania Hohnstein today has no new complaints --No fever no chills No chest pains, no palpitations, ambulating without DOE -No nausea or vomiting          Patient has been seen and examined prior to discharge     Objective   Blood pressure 138/67, pulse (!) 59, temperature 98.4 F (36.9 C), temperature source Oral, resp. rate 19, height 5\' 4"  (1.626 m), weight 60 kg, SpO2 100 %.   Intake/Output Summary (Last 24 hours) at 07/02/2019 1649 Last data filed at 07/02/2019 0900 Gross per 24 hour  Intake 1760 ml  Output 0 ml  Net 1760 ml    ExaM Gen:- Awake Alert, no acute distress  HEENT:- [place].AT, No sclera icterus Neck-Supple Neck,No JVD,.  Lungs-  CTAB , good air movement bilaterally  CV- S1, S2 normal, regular Abd-  +ve B.Sounds, Abd Soft, No tenderness,    Extremity/Skin:- No  edema,   good pulses Psych-affect is appropriate, oriented x3 Neuro-no new focal deficits, no tremors    Data Review   CBC w Diff:  Lab Results  Component Value Date   WBC 5.2 07/02/2019   HGB 10.9 (L) 07/02/2019   HCT 34.1 (L) 07/02/2019   PLT 197 07/02/2019   LYMPHOPCT 19 06/28/2019   MONOPCT 9 06/28/2019   EOSPCT 1 06/28/2019   BASOPCT 1 06/28/2019    CMP:  Lab Results  Component Value Date   NA 139 07/01/2019   K 3.8 07/01/2019   CL 109 07/01/2019   CO2 23 07/01/2019   BUN 15 07/01/2019   CREATININE 1.09 (H) 07/01/2019   CREATININE 0.86 01/18/2016   PROT 8.6 (H) 06/28/2019   ALBUMIN 3.1 (L) 06/30/2019   BILITOT 1.1 06/28/2019   ALKPHOS 119 06/28/2019   AST 33 06/28/2019   ALT 35 06/28/2019  .   Total Discharge time is about 33 minutes  Roxan Hockey M.D on 07/02/2019 at 4:49 PM  Go to [email or web address] -  for contact info  [facility] - Office  [phone]

## 2019-07-02 NOTE — Progress Notes (Signed)
Pt to Endoscopy via w/c with OR staff escort. Pt AAOx4, transferred self independently. No voiced c/o.

## 2019-07-02 NOTE — Care Management Important Message (Signed)
Important Message  Patient Details  Name: [name] MRN: [id number] Date of Birth: February 07, 1956   Medicare Important Message Given:  Yes(letter was placed at bedside)     Tommy Medal 07/02/2019, 2:37 PM

## 2019-07-02 NOTE — Progress Notes (Signed)
Bedside report completed, assumed care. Pt presents AAOx4, sitting side of bed. Pt is continuing to drink the bowel prep Go-lytely and tolerating well. Stool is thin, liquid brown. Pt is tolerating well. No other c/o at this time. Will con't to monitor.

## 2019-07-03 DIAGNOSIS — E1165 Type 2 diabetes mellitus with hyperglycemia: Secondary | ICD-10-CM | POA: Diagnosis not present

## 2019-07-03 DIAGNOSIS — Z794 Long term (current) use of insulin: Secondary | ICD-10-CM | POA: Diagnosis not present

## 2019-07-04 DIAGNOSIS — I129 Hypertensive chronic kidney disease with stage 1 through stage 4 chronic kidney disease, or unspecified chronic kidney disease: Secondary | ICD-10-CM | POA: Diagnosis not present

## 2019-07-04 DIAGNOSIS — N1832 Chronic kidney disease, stage 3b: Secondary | ICD-10-CM | POA: Diagnosis not present

## 2019-07-04 DIAGNOSIS — E1142 Type 2 diabetes mellitus with diabetic polyneuropathy: Secondary | ICD-10-CM | POA: Diagnosis not present

## 2019-07-04 DIAGNOSIS — I252 Old myocardial infarction: Secondary | ICD-10-CM | POA: Diagnosis not present

## 2019-07-04 DIAGNOSIS — I70229 Atherosclerosis of native arteries of extremities with rest pain, unspecified extremity: Secondary | ICD-10-CM | POA: Diagnosis not present

## 2019-07-04 DIAGNOSIS — I69354 Hemiplegia and hemiparesis following cerebral infarction affecting left non-dominant side: Secondary | ICD-10-CM | POA: Diagnosis not present

## 2019-07-04 DIAGNOSIS — K921 Melena: Secondary | ICD-10-CM | POA: Diagnosis not present

## 2019-07-04 DIAGNOSIS — E1151 Type 2 diabetes mellitus with diabetic peripheral angiopathy without gangrene: Secondary | ICD-10-CM | POA: Diagnosis not present

## 2019-07-04 DIAGNOSIS — E1122 Type 2 diabetes mellitus with diabetic chronic kidney disease: Secondary | ICD-10-CM | POA: Diagnosis not present

## 2019-07-04 DIAGNOSIS — K5720 Diverticulitis of large intestine with perforation and abscess without bleeding: Secondary | ICD-10-CM | POA: Diagnosis not present

## 2019-07-04 DIAGNOSIS — I2510 Atherosclerotic heart disease of native coronary artery without angina pectoris: Secondary | ICD-10-CM | POA: Diagnosis not present

## 2019-07-04 DIAGNOSIS — D631 Anemia in chronic kidney disease: Secondary | ICD-10-CM | POA: Diagnosis not present

## 2019-07-04 DIAGNOSIS — R42 Dizziness and giddiness: Secondary | ICD-10-CM | POA: Diagnosis not present

## 2019-07-06 LAB — SURGICAL PATHOLOGY

## 2019-07-07 ENCOUNTER — Ambulatory Visit (INDEPENDENT_AMBULATORY_CARE_PROVIDER_SITE_OTHER): Payer: Medicare Other | Admitting: Cardiovascular Disease

## 2019-07-07 ENCOUNTER — Telehealth (INDEPENDENT_AMBULATORY_CARE_PROVIDER_SITE_OTHER): Payer: Self-pay | Admitting: Internal Medicine

## 2019-07-07 ENCOUNTER — Other Ambulatory Visit: Payer: Self-pay

## 2019-07-07 ENCOUNTER — Encounter: Payer: Self-pay | Admitting: Cardiovascular Disease

## 2019-07-07 DIAGNOSIS — R42 Dizziness and giddiness: Secondary | ICD-10-CM | POA: Diagnosis not present

## 2019-07-07 DIAGNOSIS — E785 Hyperlipidemia, unspecified: Secondary | ICD-10-CM

## 2019-07-07 DIAGNOSIS — K921 Melena: Secondary | ICD-10-CM | POA: Diagnosis not present

## 2019-07-07 DIAGNOSIS — K5720 Diverticulitis of large intestine with perforation and abscess without bleeding: Secondary | ICD-10-CM | POA: Diagnosis not present

## 2019-07-07 DIAGNOSIS — I252 Old myocardial infarction: Secondary | ICD-10-CM | POA: Diagnosis not present

## 2019-07-07 DIAGNOSIS — E1142 Type 2 diabetes mellitus with diabetic polyneuropathy: Secondary | ICD-10-CM | POA: Diagnosis not present

## 2019-07-07 DIAGNOSIS — I70229 Atherosclerosis of native arteries of extremities with rest pain, unspecified extremity: Secondary | ICD-10-CM | POA: Diagnosis not present

## 2019-07-07 DIAGNOSIS — N1832 Chronic kidney disease, stage 3b: Secondary | ICD-10-CM | POA: Diagnosis not present

## 2019-07-07 DIAGNOSIS — I10 Essential (primary) hypertension: Secondary | ICD-10-CM

## 2019-07-07 DIAGNOSIS — Z95828 Presence of other vascular implants and grafts: Secondary | ICD-10-CM

## 2019-07-07 DIAGNOSIS — D631 Anemia in chronic kidney disease: Secondary | ICD-10-CM | POA: Diagnosis not present

## 2019-07-07 DIAGNOSIS — I69354 Hemiplegia and hemiparesis following cerebral infarction affecting left non-dominant side: Secondary | ICD-10-CM | POA: Diagnosis not present

## 2019-07-07 DIAGNOSIS — I2510 Atherosclerotic heart disease of native coronary artery without angina pectoris: Secondary | ICD-10-CM | POA: Diagnosis not present

## 2019-07-07 DIAGNOSIS — E1122 Type 2 diabetes mellitus with diabetic chronic kidney disease: Secondary | ICD-10-CM | POA: Diagnosis not present

## 2019-07-07 DIAGNOSIS — E1151 Type 2 diabetes mellitus with diabetic peripheral angiopathy without gangrene: Secondary | ICD-10-CM | POA: Diagnosis not present

## 2019-07-07 DIAGNOSIS — I129 Hypertensive chronic kidney disease with stage 1 through stage 4 chronic kidney disease, or unspecified chronic kidney disease: Secondary | ICD-10-CM | POA: Diagnosis not present

## 2019-07-07 NOTE — Patient Instructions (Signed)
Medication Instructions:  Your physician recommends that you continue on your current medications as directed. Please refer to the Current Medication list given to you today.  If you need a refill on your cardiac medications before your next appointment, please call your pharmacy.   Lab work: NONE  Testing/Procedures: NONE  Follow-Up: At [facility], you and your health needs are our priority.  As part of our continuing mission to provide you with exceptional heart care, we have created designated Provider Care Teams.  These Care Teams include your primary Cardiologist (physician) and Advanced Practice Providers (APPs -  Physician Assistants and Nurse Practitioners) who all work together to provide you with the care you need, when you need it. You may see Jonathan Berry, MD or one of the following Advanced Practice Providers on your designated Care Team:    Luke Kilroy, PA-C  Callie Goodrich, PA-C  Jesse Cleaver, FNP  Your physician wants you to follow-up in: 6 months with a physicians assistant and 1 year with Dr. Berry. You will receive a reminder letter in the mail two months in advance. If you don't receive a letter, please call our office to schedule the follow-up appointment.      

## 2019-07-07 NOTE — Progress Notes (Signed)
07/07/2019 [name]   June 19, 1955  [id number]  Primary Physician [name], MD Primary Cardiologist: Lorretta Harp MD [name], [place]  HPI:  [name] is a 64 y.o.  thin-appearing divorced African-American female mother of one, grandmother of one grandchild who is currently disabled because of a prior stroke. She was referred by Dr. Melony Overly, from [facility] podiatry, for evaluation and treatment of critical limb ischemia. I last saw her in the office  05/04/2019. Her cardiovascular risk factor profile is notable for a strong family history of heart disease with the father about a stent, mother who had bypass surgery and a sister who died at age 53 of a myocardial infarction. She has never had a heart attack but apparently has had a stroke in the past with some mild left-sided residua. She has a history of tobacco abuse in the last 43 years of one third pack per day trying to quit currently. She has treated diabetes, hypertension and hyperlipidemia. She had the onset of left ear pain approximately 3 months ago with progression to critical limb ischemia in early June with ischemic appearing left fourth toe. Dopplers in our office performed yesterday revealed a left ABI 0.6 with an occluded left SFA and one-vessel runoff. She will need to be admitted for angiography and potential endovascular therapy for critical limb ischemia. Angiogram to her on 11/03/14 revealing occluded left SFA. I performed [facility] one directional atherectomy, PTA and stenting using a Viabahn covered stent with an excellent angiographic and clinical result. Her pain has resolved. Her critical limb ischemia has resolved. Her Dopplers have normalized. Since I saw her approximately 6 weeks ago she's had 4 episodes of night nitroglycerin responsive chest pain. Recent Dopplers did show a decline in her left ABI from 1.1 6 months ago to .91 with a simultaneous increase in velocity in her mid left SFA. Dopplers  performed 12/20/15 revealed a further reduction in her left ABI down to 0.71 with a high-frequency signal in her mid left SFA and worsening symptoms of claudication   I performed angiography on her 01/29/2016 revealing an occluded left SFA stent. She ultimately underwent left femoropopliteal bypass grafting 06/09/2016 by Dr. Trula Slade using PTFE. He follows his noninvasively in his office. Her symptoms resolved. She also underwent cardiac catheterization by Dr. [name] because of chest pain 12/26/2016 revealing minimal CAD. Unfortunately, she has gone back to smoking 10 cigarettes a day.  Since I saw her in the office 2 months ago she was admitted to the hospital twice because of dizziness without a clear cause.  She denies chest pain or shortness of breath.  She has however stopped smoking..   Current Meds  Medication Sig   acetaminophen (TYLENOL) 325 MG tablet Take 2 tablets (650 mg total) by mouth every 6 (six) hours as needed for mild pain (or Fever >/= 101).   albuterol (PROAIR HFA) 108 (90 Base) MCG/ACT inhaler 1-2 inhalations every 4-6 hours as needed for cough or wheeze. (Patient taking differently: Inhale 1-2 puffs into the lungs every 4 (four) hours as needed for shortness of breath. )   ALPRAZolam (XANAX) 0.5 MG tablet Take 0.5 mg by mouth 2 (two) times daily as needed for anxiety.   aspirin EC 81 MG tablet Take 1 tablet (81 mg total) by mouth daily with breakfast.   Azelastine HCl 0.15 % SOLN Place 2 sprays into both nostrils 2 (two) times daily. (Patient taking differently: Place 2 sprays into both nostrils 2 (  two) times daily as needed (allergies). )   citalopram (CELEXA) 10 MG tablet Take 10 mg by mouth daily as needed.    cloNIDine (CATAPRES) 0.1 MG tablet Take 1 tablet (0.1 mg total) by mouth at bedtime.   clopidogrel (PLAVIX) 75 MG tablet Take 1 tablet (75 mg total) by mouth daily.   EPINEPHrine 0.3 mg/0.3 mL IJ SOAJ injection Inject 0.3 mLs (0.3 mg total) into the muscle  once. (Patient taking differently: Inject 0.3 mg into the muscle as needed for anaphylaxis. )   HYDROcodone-acetaminophen (NORCO) 7.5-325 MG tablet Take 1 tablet by mouth daily as needed for moderate pain.    insulin lispro (HUMALOG) 100 UNIT/ML injection Inject 5-12 Units into the skin 2 (two) times daily before a meal. Per sliding scale using OMNIPOD   losartan (COZAAR) 50 MG tablet Take 1 tablet (50 mg total) by mouth daily.   mupirocin ointment (BACTROBAN) 2 % Apply 1 application topically 2 (two) times daily. (Patient taking differently: Apply 1 application topically 2 (two) times daily as needed. )   nitroGLYCERIN (NITROSTAT) 0.4 MG SL tablet PLACE 1 TAB UNDER TONGUE EVERY 5 MINS AS NEEDED FOR CHEST PAIN - MAX 3 DOSES THEN 911 (Patient taking differently: Place 0.4 mg under the tongue every 5 (five) minutes as needed for chest pain. MAX 3 DOSES THEN 911)   pantoprazole (PROTONIX) 40 MG tablet TAKE 1 TABLET BY MOUTH EVERY DAY (Patient taking differently: Take 40 mg by mouth daily. )   propranolol ER (INDERAL LA) 60 MG 24 hr capsule Take 1 capsule (60 mg total) by mouth every evening.   rosuvastatin (CRESTOR) 40 MG tablet Take 1 tablet (40 mg total) by mouth every morning.   SUMAtriptan (IMITREX) 100 MG tablet Take 100 mg by mouth every 2 (two) hours as needed for migraine.    temazepam (RESTORIL) 30 MG capsule Take 30 mg by mouth at bedtime as needed for sleep.   traZODone (DESYREL) 100 MG tablet Take 1 tablet (100 mg total) by mouth at bedtime.     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   Fish Allergy Hives and Swelling    Tongue swelling   Iodine Swelling   Metformin And Related Diarrhea   [name] [Insulin Glargine-Lixisenatide] Itching and Other (See Comments)    "tongue swelling"   Adhesive [Tape] Rash    Paper tape is ok    Shellfish Allergy Swelling and Rash    Tongue swelling    Social History   Socioeconomic History   Marital status: Divorced    Spouse name: Not on file   Number of children: 1   Years of education: Not on file   Highest education level: Not on file  Occupational History    Employer: UNEMPLOYED  Tobacco Use   Smoking status: Former Smoker    Packs/day: 0.50    Years: 41.00    Pack years: 20.50    Types: Cigarettes    Quit date: 01/15/2016    Years since quitting: 3.4   Smokeless tobacco: Never Used   Tobacco comment: Getting ready to start nicotine patches RX by Dr. Gwenlyn Found per pt.  Substance and Sexual Activity   Alcohol use: No    Alcohol/week: 0.0 standard drinks   Drug use: No    Types: "Crack" cocaine    Comment: 05/09/2015.  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  Divorced, living alone in [place].     Social Determinants of Health   Financial Resource Strain:    Difficulty of Paying Living Expenses: Not on file  Food Insecurity:    Worried About Charity fundraiser in the Last Year: Not on file   [employer] of Food in the Last Year: Not on file  Transportation Needs:    Lack of Transportation (Medical): Not on file   Lack of Transportation (Non-Medical): Not on file  Physical Activity:    Days of Exercise per Week: Not on file   Minutes of Exercise per Session: Not on file  Stress:    Feeling of Stress : Not on file  Social Connections:    Frequency of Communication with Friends and Family: Not on file   Frequency of Social Gatherings with Friends and Family: Not on file   Attends Religious Services: Not on file   Active Member of Clubs or Organizations: Not on file   Attends Archivist  Meetings: Not on file   Marital Status: Not on file  Intimate Partner Violence:    Fear of Current or Ex-Partner: Not on file   Emotionally Abused: Not on file   Physically Abused: Not on file   Sexually Abused: Not on file     Review of Systems: General: negative for chills, fever, night sweats or weight changes.  Cardiovascular: negative for chest pain, dyspnea on exertion, edema, orthopnea, palpitations, paroxysmal nocturnal dyspnea or shortness of breath Dermatological: negative for rash Respiratory: negative for cough or wheezing Urologic: negative for hematuria Abdominal: negative for nausea, vomiting, diarrhea, bright red blood per rectum, melena, or hematemesis Neurologic: negative for visual changes, syncope, or dizziness All other systems reviewed and are otherwise negative except as noted above.    Blood pressure (!) 142/72, pulse 85, temperature 98.1 F (36.7 C), height 5\' 4"  (1.626 m), weight 132 lb 9.6 oz (60.1 kg), SpO2 94 %.  General appearance: alert and no distress Neck: no adenopathy, no carotid bruit, no JVD, supple, symmetrical, trachea midline and thyroid not enlarged, symmetric, no tenderness/mass/nodules Lungs: clear to auscultation bilaterally Heart: regular rate and rhythm, S1, S2 normal, no murmur, click, rub or gallop Extremities: extremities normal, atraumatic, no cyanosis or edema Pulses: 2+ and symmetric Skin: Skin color, texture, turgor normal. No rashes or lesions Neurologic: Alert and oriented X 3, normal strength and tone. Normal symmetric reflexes. Normal coordination and gait  EKG not performed today  ASSESSMENT AND PLAN:   Essential hypertension History of essential hypertension with blood pressure measured today 142/72.  She is on clonidine, losartan and propranolol.  S/P femoral-popliteal bypass surgery History of peripheral arterial disease status post multiple endovascular procedures performed by myself initially for critical limb  ischemia 11/03/2014 and again 01/29/2016 which ultimately showed an occluded left SFA stent requiring left femoropopliteal bypass grafting by Dr. Trula Slade 06/09/2016 which he follows.  Hyperlipidemia LDL goal <70 History of hyperlipidemia on statin therapy with lipid profile performed 02/25/2019 revealing total cholesterol 112, LDL of 51 and HDL 40.      Lorretta Harp MD FACP,FACC,FAHA, [facility] 07/07/2019 3:00 PM

## 2019-07-07 NOTE — Assessment & Plan Note (Signed)
History of essential hypertension with blood pressure measured today 142/72.  She is on clonidine, losartan and propranolol.

## 2019-07-07 NOTE — Assessment & Plan Note (Signed)
History of hyperlipidemia on statin therapy with lipid profile performed 02/25/2019 revealing total cholesterol 112, LDL of 51 and HDL 40.

## 2019-07-07 NOTE — Assessment & Plan Note (Signed)
History of peripheral arterial disease status post multiple endovascular procedures performed by myself initially for critical limb ischemia 11/03/2014 and again 01/29/2016 which ultimately showed an occluded left SFA stent requiring left femoropopliteal bypass grafting by Dr. Trula Slade 06/09/2016 which he follows.

## 2019-07-08 NOTE — Telephone Encounter (Signed)
7 yr TCS noted in recall procedure note and pathology result faxed to PCP

## 2019-07-12 DIAGNOSIS — E1142 Type 2 diabetes mellitus with diabetic polyneuropathy: Secondary | ICD-10-CM | POA: Diagnosis not present

## 2019-07-12 DIAGNOSIS — K5720 Diverticulitis of large intestine with perforation and abscess without bleeding: Secondary | ICD-10-CM | POA: Diagnosis not present

## 2019-07-12 DIAGNOSIS — D631 Anemia in chronic kidney disease: Secondary | ICD-10-CM | POA: Diagnosis not present

## 2019-07-12 DIAGNOSIS — E1122 Type 2 diabetes mellitus with diabetic chronic kidney disease: Secondary | ICD-10-CM | POA: Diagnosis not present

## 2019-07-12 DIAGNOSIS — I252 Old myocardial infarction: Secondary | ICD-10-CM | POA: Diagnosis not present

## 2019-07-12 DIAGNOSIS — I2510 Atherosclerotic heart disease of native coronary artery without angina pectoris: Secondary | ICD-10-CM | POA: Diagnosis not present

## 2019-07-12 DIAGNOSIS — I70229 Atherosclerosis of native arteries of extremities with rest pain, unspecified extremity: Secondary | ICD-10-CM | POA: Diagnosis not present

## 2019-07-12 DIAGNOSIS — R42 Dizziness and giddiness: Secondary | ICD-10-CM | POA: Diagnosis not present

## 2019-07-12 DIAGNOSIS — I69354 Hemiplegia and hemiparesis following cerebral infarction affecting left non-dominant side: Secondary | ICD-10-CM | POA: Diagnosis not present

## 2019-07-12 DIAGNOSIS — I129 Hypertensive chronic kidney disease with stage 1 through stage 4 chronic kidney disease, or unspecified chronic kidney disease: Secondary | ICD-10-CM | POA: Diagnosis not present

## 2019-07-12 DIAGNOSIS — K921 Melena: Secondary | ICD-10-CM | POA: Diagnosis not present

## 2019-07-12 DIAGNOSIS — E1151 Type 2 diabetes mellitus with diabetic peripheral angiopathy without gangrene: Secondary | ICD-10-CM | POA: Diagnosis not present

## 2019-07-12 DIAGNOSIS — N1832 Chronic kidney disease, stage 3b: Secondary | ICD-10-CM | POA: Diagnosis not present

## 2019-07-14 DIAGNOSIS — K5720 Diverticulitis of large intestine with perforation and abscess without bleeding: Secondary | ICD-10-CM | POA: Diagnosis not present

## 2019-07-14 DIAGNOSIS — E1142 Type 2 diabetes mellitus with diabetic polyneuropathy: Secondary | ICD-10-CM | POA: Diagnosis not present

## 2019-07-14 DIAGNOSIS — I129 Hypertensive chronic kidney disease with stage 1 through stage 4 chronic kidney disease, or unspecified chronic kidney disease: Secondary | ICD-10-CM | POA: Diagnosis not present

## 2019-07-14 DIAGNOSIS — R000 Tachycardia, unspecified: Secondary | ICD-10-CM | POA: Diagnosis not present

## 2019-07-14 DIAGNOSIS — I959 Hypotension, unspecified: Secondary | ICD-10-CM | POA: Diagnosis not present

## 2019-07-14 DIAGNOSIS — R21 Rash and other nonspecific skin eruption: Secondary | ICD-10-CM | POA: Diagnosis not present

## 2019-07-14 DIAGNOSIS — J060 Acute laryngopharyngitis: Secondary | ICD-10-CM | POA: Diagnosis not present

## 2019-07-14 DIAGNOSIS — Z0189 Encounter for other specified special examinations: Secondary | ICD-10-CM | POA: Diagnosis not present

## 2019-07-14 DIAGNOSIS — L03313 Cellulitis of chest wall: Secondary | ICD-10-CM | POA: Diagnosis not present

## 2019-07-14 DIAGNOSIS — N1832 Chronic kidney disease, stage 3b: Secondary | ICD-10-CM | POA: Diagnosis not present

## 2019-07-14 DIAGNOSIS — E876 Hypokalemia: Secondary | ICD-10-CM | POA: Diagnosis not present

## 2019-07-14 DIAGNOSIS — E1151 Type 2 diabetes mellitus with diabetic peripheral angiopathy without gangrene: Secondary | ICD-10-CM | POA: Diagnosis not present

## 2019-07-14 DIAGNOSIS — K922 Gastrointestinal hemorrhage, unspecified: Secondary | ICD-10-CM | POA: Diagnosis not present

## 2019-07-14 DIAGNOSIS — I2510 Atherosclerotic heart disease of native coronary artery without angina pectoris: Secondary | ICD-10-CM | POA: Diagnosis not present

## 2019-07-14 DIAGNOSIS — T783XXA Angioneurotic edema, initial encounter: Secondary | ICD-10-CM | POA: Diagnosis not present

## 2019-07-14 DIAGNOSIS — I252 Old myocardial infarction: Secondary | ICD-10-CM | POA: Diagnosis not present

## 2019-07-14 DIAGNOSIS — K921 Melena: Secondary | ICD-10-CM | POA: Diagnosis not present

## 2019-07-14 DIAGNOSIS — E1122 Type 2 diabetes mellitus with diabetic chronic kidney disease: Secondary | ICD-10-CM | POA: Diagnosis not present

## 2019-07-14 DIAGNOSIS — I69354 Hemiplegia and hemiparesis following cerebral infarction affecting left non-dominant side: Secondary | ICD-10-CM | POA: Diagnosis not present

## 2019-07-14 DIAGNOSIS — I70229 Atherosclerosis of native arteries of extremities with rest pain, unspecified extremity: Secondary | ICD-10-CM | POA: Diagnosis not present

## 2019-07-14 DIAGNOSIS — R42 Dizziness and giddiness: Secondary | ICD-10-CM | POA: Diagnosis not present

## 2019-07-14 DIAGNOSIS — D631 Anemia in chronic kidney disease: Secondary | ICD-10-CM | POA: Diagnosis not present

## 2019-07-19 ENCOUNTER — Other Ambulatory Visit (INDEPENDENT_AMBULATORY_CARE_PROVIDER_SITE_OTHER): Payer: Self-pay | Admitting: *Deleted

## 2019-07-19 ENCOUNTER — Encounter (INDEPENDENT_AMBULATORY_CARE_PROVIDER_SITE_OTHER): Payer: Self-pay | Admitting: *Deleted

## 2019-07-19 ENCOUNTER — Telehealth (INDEPENDENT_AMBULATORY_CARE_PROVIDER_SITE_OTHER): Payer: Self-pay | Admitting: Internal Medicine

## 2019-07-19 DIAGNOSIS — K581 Irritable bowel syndrome with constipation: Secondary | ICD-10-CM

## 2019-07-19 MED ORDER — LINACLOTIDE 145 MCG PO CAPS: 145.0000 ug | ORAL_CAPSULE | Freq: Every day | ORAL | 5 refills | Status: DC

## 2019-07-19 NOTE — Telephone Encounter (Signed)
Dr.Rehman has been sent a message about this.

## 2019-07-19 NOTE — Telephone Encounter (Signed)
Per Dr.Rehman - Patient may take Linzess 145 mcg by mouth daily . Can use Fleet Enema today. Patient was sent a MyChart message with these instructions. Rx sent to her pharmacy.

## 2019-07-19 NOTE — Telephone Encounter (Signed)
Patient was called  (voicemail left) asking what medication is she taking for the constipation. Ask that she call our office back.

## 2019-07-19 NOTE — Telephone Encounter (Signed)
Patient left message stating to let Dr Laural Golden know she has not had a BM since 2/26  -  Please advise - [phone]

## 2019-07-22 DIAGNOSIS — I252 Old myocardial infarction: Secondary | ICD-10-CM | POA: Diagnosis not present

## 2019-07-22 DIAGNOSIS — D631 Anemia in chronic kidney disease: Secondary | ICD-10-CM | POA: Diagnosis not present

## 2019-07-22 DIAGNOSIS — E1122 Type 2 diabetes mellitus with diabetic chronic kidney disease: Secondary | ICD-10-CM | POA: Diagnosis not present

## 2019-07-22 DIAGNOSIS — K921 Melena: Secondary | ICD-10-CM | POA: Diagnosis not present

## 2019-07-22 DIAGNOSIS — I70229 Atherosclerosis of native arteries of extremities with rest pain, unspecified extremity: Secondary | ICD-10-CM | POA: Diagnosis not present

## 2019-07-22 DIAGNOSIS — E1142 Type 2 diabetes mellitus with diabetic polyneuropathy: Secondary | ICD-10-CM | POA: Diagnosis not present

## 2019-07-22 DIAGNOSIS — E1151 Type 2 diabetes mellitus with diabetic peripheral angiopathy without gangrene: Secondary | ICD-10-CM | POA: Diagnosis not present

## 2019-07-22 DIAGNOSIS — I69354 Hemiplegia and hemiparesis following cerebral infarction affecting left non-dominant side: Secondary | ICD-10-CM | POA: Diagnosis not present

## 2019-07-22 DIAGNOSIS — R42 Dizziness and giddiness: Secondary | ICD-10-CM | POA: Diagnosis not present

## 2019-07-22 DIAGNOSIS — N1832 Chronic kidney disease, stage 3b: Secondary | ICD-10-CM | POA: Diagnosis not present

## 2019-07-22 DIAGNOSIS — K5720 Diverticulitis of large intestine with perforation and abscess without bleeding: Secondary | ICD-10-CM | POA: Diagnosis not present

## 2019-07-22 DIAGNOSIS — I129 Hypertensive chronic kidney disease with stage 1 through stage 4 chronic kidney disease, or unspecified chronic kidney disease: Secondary | ICD-10-CM | POA: Diagnosis not present

## 2019-07-22 DIAGNOSIS — I2510 Atherosclerotic heart disease of native coronary artery without angina pectoris: Secondary | ICD-10-CM | POA: Diagnosis not present

## 2019-07-28 ENCOUNTER — Other Ambulatory Visit: Payer: Self-pay

## 2019-07-28 ENCOUNTER — Encounter (HOSPITAL_COMMUNITY): Payer: Self-pay | Admitting: Emergency Medicine

## 2019-07-28 ENCOUNTER — Emergency Department (HOSPITAL_COMMUNITY): Payer: Medicare Other

## 2019-07-28 ENCOUNTER — Inpatient Hospital Stay (HOSPITAL_COMMUNITY)
Admission: EM | Admit: 2019-07-28 | Discharge: 2019-07-30 | DRG: 683 | Disposition: A | Discharge status: Home / Self Care | Payer: Medicare Other | Source: Ambulatory Visit | Attending: Internal Medicine | Admitting: Internal Medicine

## 2019-07-28 DIAGNOSIS — H539 Unspecified visual disturbance: Secondary | ICD-10-CM | POA: Diagnosis present

## 2019-07-28 DIAGNOSIS — E1151 Type 2 diabetes mellitus with diabetic peripheral angiopathy without gangrene: Secondary | ICD-10-CM | POA: Diagnosis present

## 2019-07-28 DIAGNOSIS — R110 Nausea: Secondary | ICD-10-CM

## 2019-07-28 DIAGNOSIS — Z9089 Acquired absence of other organs: Secondary | ICD-10-CM

## 2019-07-28 DIAGNOSIS — R42 Dizziness and giddiness: Secondary | ICD-10-CM | POA: Diagnosis not present

## 2019-07-28 DIAGNOSIS — K222 Esophageal obstruction: Secondary | ICD-10-CM | POA: Diagnosis present

## 2019-07-28 DIAGNOSIS — E876 Hypokalemia: Secondary | ICD-10-CM

## 2019-07-28 DIAGNOSIS — J45909 Unspecified asthma, uncomplicated: Secondary | ICD-10-CM | POA: Diagnosis present

## 2019-07-28 DIAGNOSIS — I252 Old myocardial infarction: Secondary | ICD-10-CM

## 2019-07-28 DIAGNOSIS — E1165 Type 2 diabetes mellitus with hyperglycemia: Secondary | ICD-10-CM | POA: Diagnosis present

## 2019-07-28 DIAGNOSIS — Z91041 Radiographic dye allergy status: Secondary | ICD-10-CM

## 2019-07-28 DIAGNOSIS — Z981 Arthrodesis status: Secondary | ICD-10-CM

## 2019-07-28 DIAGNOSIS — R531 Weakness: Secondary | ICD-10-CM

## 2019-07-28 DIAGNOSIS — N179 Acute kidney failure, unspecified: Secondary | ICD-10-CM | POA: Diagnosis not present

## 2019-07-28 DIAGNOSIS — I951 Orthostatic hypotension: Secondary | ICD-10-CM

## 2019-07-28 DIAGNOSIS — Z91048 Other nonmedicinal substance allergy status: Secondary | ICD-10-CM

## 2019-07-28 DIAGNOSIS — K3184 Gastroparesis: Secondary | ICD-10-CM | POA: Diagnosis present

## 2019-07-28 DIAGNOSIS — E860 Dehydration: Secondary | ICD-10-CM | POA: Diagnosis present

## 2019-07-28 DIAGNOSIS — E871 Hypo-osmolality and hyponatremia: Secondary | ICD-10-CM | POA: Diagnosis not present

## 2019-07-28 DIAGNOSIS — Z9841 Cataract extraction status, right eye: Secondary | ICD-10-CM

## 2019-07-28 DIAGNOSIS — Z20822 Contact with and (suspected) exposure to covid-19: Secondary | ICD-10-CM | POA: Diagnosis present

## 2019-07-28 DIAGNOSIS — R079 Chest pain, unspecified: Secondary | ICD-10-CM | POA: Diagnosis not present

## 2019-07-28 DIAGNOSIS — Z79899 Other long term (current) drug therapy: Secondary | ICD-10-CM

## 2019-07-28 DIAGNOSIS — Z8349 Family history of other endocrine, nutritional and metabolic diseases: Secondary | ICD-10-CM

## 2019-07-28 DIAGNOSIS — Z836 Family history of other diseases of the respiratory system: Secondary | ICD-10-CM

## 2019-07-28 DIAGNOSIS — I959 Hypotension, unspecified: Secondary | ICD-10-CM | POA: Diagnosis not present

## 2019-07-28 DIAGNOSIS — Z8249 Family history of ischemic heart disease and other diseases of the circulatory system: Secondary | ICD-10-CM

## 2019-07-28 DIAGNOSIS — K580 Irritable bowel syndrome with diarrhea: Secondary | ICD-10-CM | POA: Diagnosis not present

## 2019-07-28 DIAGNOSIS — I69398 Other sequelae of cerebral infarction: Secondary | ICD-10-CM

## 2019-07-28 DIAGNOSIS — Z9842 Cataract extraction status, left eye: Secondary | ICD-10-CM

## 2019-07-28 DIAGNOSIS — F419 Anxiety disorder, unspecified: Secondary | ICD-10-CM | POA: Diagnosis present

## 2019-07-28 DIAGNOSIS — K5909 Other constipation: Secondary | ICD-10-CM | POA: Diagnosis present

## 2019-07-28 DIAGNOSIS — Z95820 Peripheral vascular angioplasty status with implants and grafts: Secondary | ICD-10-CM

## 2019-07-28 DIAGNOSIS — I2510 Atherosclerotic heart disease of native coronary artery without angina pectoris: Secondary | ICD-10-CM | POA: Diagnosis present

## 2019-07-28 DIAGNOSIS — E785 Hyperlipidemia, unspecified: Secondary | ICD-10-CM | POA: Diagnosis present

## 2019-07-28 DIAGNOSIS — Z79891 Long term (current) use of opiate analgesic: Secondary | ICD-10-CM

## 2019-07-28 DIAGNOSIS — R000 Tachycardia, unspecified: Secondary | ICD-10-CM | POA: Diagnosis not present

## 2019-07-28 DIAGNOSIS — I129 Hypertensive chronic kidney disease with stage 1 through stage 4 chronic kidney disease, or unspecified chronic kidney disease: Secondary | ICD-10-CM | POA: Diagnosis present

## 2019-07-28 DIAGNOSIS — Z882 Allergy status to sulfonamides status: Secondary | ICD-10-CM

## 2019-07-28 DIAGNOSIS — K922 Gastrointestinal hemorrhage, unspecified: Secondary | ICD-10-CM | POA: Diagnosis not present

## 2019-07-28 DIAGNOSIS — R7989 Other specified abnormal findings of blood chemistry: Secondary | ICD-10-CM | POA: Diagnosis not present

## 2019-07-28 DIAGNOSIS — I69354 Hemiplegia and hemiparesis following cerebral infarction affecting left non-dominant side: Secondary | ICD-10-CM

## 2019-07-28 DIAGNOSIS — Z888 Allergy status to other drugs, medicaments and biological substances status: Secondary | ICD-10-CM

## 2019-07-28 DIAGNOSIS — Z95828 Presence of other vascular implants and grafts: Secondary | ICD-10-CM

## 2019-07-28 DIAGNOSIS — K219 Gastro-esophageal reflux disease without esophagitis: Secondary | ICD-10-CM | POA: Diagnosis present

## 2019-07-28 DIAGNOSIS — Z91013 Allergy to seafood: Secondary | ICD-10-CM

## 2019-07-28 DIAGNOSIS — Z90710 Acquired absence of both cervix and uterus: Secondary | ICD-10-CM

## 2019-07-28 DIAGNOSIS — Z961 Presence of intraocular lens: Secondary | ICD-10-CM | POA: Diagnosis present

## 2019-07-28 DIAGNOSIS — Z803 Family history of malignant neoplasm of breast: Secondary | ICD-10-CM

## 2019-07-28 DIAGNOSIS — E1122 Type 2 diabetes mellitus with diabetic chronic kidney disease: Secondary | ICD-10-CM | POA: Diagnosis not present

## 2019-07-28 DIAGNOSIS — Z885 Allergy status to narcotic agent status: Secondary | ICD-10-CM

## 2019-07-28 DIAGNOSIS — E1143 Type 2 diabetes mellitus with diabetic autonomic (poly)neuropathy: Secondary | ICD-10-CM | POA: Diagnosis present

## 2019-07-28 DIAGNOSIS — M19042 Primary osteoarthritis, left hand: Secondary | ICD-10-CM | POA: Diagnosis present

## 2019-07-28 DIAGNOSIS — G43909 Migraine, unspecified, not intractable, without status migrainosus: Secondary | ICD-10-CM | POA: Diagnosis present

## 2019-07-28 DIAGNOSIS — E119 Type 2 diabetes mellitus without complications: Secondary | ICD-10-CM

## 2019-07-28 DIAGNOSIS — Z8719 Personal history of other diseases of the digestive system: Secondary | ICD-10-CM

## 2019-07-28 DIAGNOSIS — N1831 Chronic kidney disease, stage 3a: Secondary | ICD-10-CM | POA: Diagnosis not present

## 2019-07-28 DIAGNOSIS — Z823 Family history of stroke: Secondary | ICD-10-CM

## 2019-07-28 DIAGNOSIS — I4581 Long QT syndrome: Secondary | ICD-10-CM | POA: Diagnosis not present

## 2019-07-28 DIAGNOSIS — I10 Essential (primary) hypertension: Secondary | ICD-10-CM | POA: Diagnosis present

## 2019-07-28 DIAGNOSIS — R778 Other specified abnormalities of plasma proteins: Secondary | ICD-10-CM

## 2019-07-28 DIAGNOSIS — Z825 Family history of asthma and other chronic lower respiratory diseases: Secondary | ICD-10-CM

## 2019-07-28 DIAGNOSIS — Z7902 Long term (current) use of antithrombotics/antiplatelets: Secondary | ICD-10-CM

## 2019-07-28 DIAGNOSIS — Z833 Family history of diabetes mellitus: Secondary | ICD-10-CM

## 2019-07-28 DIAGNOSIS — Z87891 Personal history of nicotine dependence: Secondary | ICD-10-CM

## 2019-07-28 DIAGNOSIS — Z9049 Acquired absence of other specified parts of digestive tract: Secondary | ICD-10-CM

## 2019-07-28 DIAGNOSIS — Z7982 Long term (current) use of aspirin: Secondary | ICD-10-CM

## 2019-07-28 LAB — CBC WITH DIFFERENTIAL/PLATELET
Abs Immature Granulocytes: 0.04 10*3/uL (ref 0.00–0.07)
Basophils Absolute: 0.1 10*3/uL (ref 0.0–0.1)
Basophils Relative: 1 %
Eosinophils Absolute: 0 10*3/uL (ref 0.0–0.5)
Eosinophils Relative: 0 %
HCT: 42.4 % (ref 36.0–46.0)
Hemoglobin: 13.4 g/dL (ref 12.0–15.0)
Immature Granulocytes: 0 %
Lymphocytes Relative: 13 %
Lymphs Abs: 1.5 10*3/uL (ref 0.7–4.0)
MCH: 27.2 pg (ref 26.0–34.0)
MCHC: 31.6 g/dL (ref 30.0–36.0)
MCV: 86.2 fL (ref 80.0–100.0)
Monocytes Absolute: 0.8 10*3/uL (ref 0.1–1.0)
Monocytes Relative: 7 %
Neutro Abs: 8.7 10*3/uL — ABNORMAL HIGH (ref 1.7–7.7)
Neutrophils Relative %: 79 %
Platelets: 268 10*3/uL (ref 150–400)
RBC: 4.92 MIL/uL (ref 3.87–5.11)
RDW: 12.8 % (ref 11.5–15.5)
WBC: 11 10*3/uL — ABNORMAL HIGH (ref 4.0–10.5)
nRBC: 0 % (ref 0.0–0.2)

## 2019-07-28 LAB — COMPREHENSIVE METABOLIC PANEL
ALT: 22 U/L (ref 0–44)
AST: 26 U/L (ref 15–41)
Albumin: 4 g/dL (ref 3.5–5.0)
Alkaline Phosphatase: 83 U/L (ref 38–126)
Anion gap: 13 (ref 5–15)
BUN: 28 mg/dL — ABNORMAL HIGH (ref 8–23)
CO2: 29 mmol/L (ref 22–32)
Calcium: 9.3 mg/dL (ref 8.9–10.3)
Chloride: 87 mmol/L — ABNORMAL LOW (ref 98–111)
Creatinine, Ser: 1.84 mg/dL — ABNORMAL HIGH (ref 0.44–1.00)
GFR calc Af Amer: 33 mL/min — ABNORMAL LOW (ref >60)
GFR calc non Af Amer: 28 mL/min — ABNORMAL LOW (ref >60)
Glucose, Bld: 260 mg/dL — ABNORMAL HIGH (ref 70–99)
Potassium: 2.2 mmol/L — CL (ref 3.5–5.1)
Sodium: 129 mmol/L — ABNORMAL LOW (ref 135–145)
Total Bilirubin: 1.8 mg/dL — ABNORMAL HIGH (ref 0.3–1.2)
Total Protein: 7.4 g/dL (ref 6.5–8.1)

## 2019-07-28 LAB — GLUCOSE, CAPILLARY: Glucose-Capillary: 191 mg/dL — ABNORMAL HIGH (ref 70–99)

## 2019-07-28 LAB — TSH: TSH: 0.651 u[IU]/mL (ref 0.350–4.500)

## 2019-07-28 LAB — POTASSIUM: Potassium: 2.9 mmol/L — ABNORMAL LOW (ref 3.5–5.1)

## 2019-07-28 LAB — MAGNESIUM: Magnesium: 2.5 mg/dL — ABNORMAL HIGH (ref 1.7–2.4)

## 2019-07-28 LAB — LACTIC ACID, PLASMA
Lactic Acid, Venous: 1.9 mmol/L (ref 0.5–1.9)
Lactic Acid, Venous: 2.1 mmol/L (ref 0.5–1.9)

## 2019-07-28 LAB — TROPONIN I (HIGH SENSITIVITY)
Troponin I (High Sensitivity): 36 ng/L — ABNORMAL HIGH (ref <18)
Troponin I (High Sensitivity): 36 ng/L — ABNORMAL HIGH (ref <18)

## 2019-07-28 MED ORDER — POTASSIUM CHLORIDE 10 MEQ/100ML IV SOLN
10.0000 meq | INTRAVENOUS | Status: AC
  Administered 2019-07-28 (×3): 10 meq via INTRAVENOUS
  Filled 2019-07-28 (×3): qty 100

## 2019-07-28 MED ORDER — POTASSIUM CHLORIDE CRYS ER 20 MEQ PO TBCR
40.0000 meq | EXTENDED_RELEASE_TABLET | ORAL | Status: AC
  Administered 2019-07-28: 22:00:00 40 meq via ORAL
  Filled 2019-07-28: qty 2

## 2019-07-28 MED ORDER — CLOPIDOGREL BISULFATE 75 MG PO TABS
75.0000 mg | ORAL_TABLET | Freq: Every day | ORAL | Status: DC
  Administered 2019-07-28 – 2019-07-30 (×3): 75 mg via ORAL
  Filled 2019-07-28 (×3): qty 1

## 2019-07-28 MED ORDER — ONDANSETRON HCL 4 MG/2ML IJ SOLN
4.0000 mg | Freq: Once | INTRAMUSCULAR | Status: AC
  Administered 2019-07-28: 4 mg via INTRAVENOUS
  Filled 2019-07-28: qty 2

## 2019-07-28 MED ORDER — ACETAMINOPHEN 325 MG PO TABS
650.0000 mg | ORAL_TABLET | Freq: Once | ORAL | Status: AC
  Administered 2019-07-28: 650 mg via ORAL
  Filled 2019-07-28: qty 2

## 2019-07-28 MED ORDER — POLYETHYLENE GLYCOL 3350 17 G PO PACK
17.0000 g | PACK | Freq: Two times a day (BID) | ORAL | Status: DC
  Administered 2019-07-29: 17 g via ORAL
  Filled 2019-07-28: qty 1

## 2019-07-28 MED ORDER — POTASSIUM CHLORIDE IN NACL 40-0.9 MEQ/L-% IV SOLN
INTRAVENOUS | Status: AC
  Administered 2019-07-29 (×2): 100 mL/h via INTRAVENOUS

## 2019-07-28 MED ORDER — CITALOPRAM HYDROBROMIDE 10 MG PO TABS
10.0000 mg | ORAL_TABLET | Freq: Every day | ORAL | Status: DC
  Administered 2019-07-28 – 2019-07-30 (×3): 10 mg via ORAL
  Filled 2019-07-28 (×3): qty 1

## 2019-07-28 MED ORDER — ACETAMINOPHEN 325 MG PO TABS
650.0000 mg | ORAL_TABLET | Freq: Four times a day (QID) | ORAL | Status: DC | PRN
  Administered 2019-07-29 – 2019-07-30 (×4): 650 mg via ORAL
  Filled 2019-07-28 (×4): qty 2

## 2019-07-28 MED ORDER — HEPARIN SODIUM (PORCINE) 5000 UNIT/ML IJ SOLN
5000.0000 [IU] | Freq: Three times a day (TID) | INTRAMUSCULAR | Status: DC
  Administered 2019-07-28 – 2019-07-30 (×5): 5000 [IU] via SUBCUTANEOUS
  Filled 2019-07-28 (×5): qty 1

## 2019-07-28 MED ORDER — INSULIN ASPART 100 UNIT/ML ~~LOC~~ SOLN: 0.0000 [IU] | Freq: Every day | SUBCUTANEOUS | Status: DC

## 2019-07-28 MED ORDER — SODIUM CHLORIDE 0.9 % IV BOLUS
1000.0000 mL | Freq: Once | INTRAVENOUS | Status: AC
  Administered 2019-07-28: 1000 mL via INTRAVENOUS

## 2019-07-28 MED ORDER — ACETAMINOPHEN 650 MG RE SUPP: 650.0000 mg | Freq: Four times a day (QID) | RECTAL | Status: DC | PRN

## 2019-07-28 MED ORDER — TRAZODONE HCL 50 MG PO TABS
100.0000 mg | ORAL_TABLET | Freq: Every day | ORAL | Status: DC
  Administered 2019-07-29: 100 mg via ORAL
  Filled 2019-07-28 (×2): qty 2

## 2019-07-28 MED ORDER — ALPRAZOLAM 0.5 MG PO TABS
0.5000 mg | ORAL_TABLET | Freq: Every evening | ORAL | Status: DC | PRN
  Filled 2019-07-28: qty 1

## 2019-07-28 MED ORDER — POTASSIUM CHLORIDE 10 MEQ/100ML IV SOLN
10.0000 meq | INTRAVENOUS | Status: DC
  Administered 2019-07-28: 10 meq via INTRAVENOUS
  Filled 2019-07-28: qty 100

## 2019-07-28 MED ORDER — POTASSIUM CHLORIDE CRYS ER 20 MEQ PO TBCR
40.0000 meq | EXTENDED_RELEASE_TABLET | ORAL | Status: DC
  Administered 2019-07-28: 40 meq via ORAL
  Filled 2019-07-28: qty 2

## 2019-07-28 MED ORDER — PANTOPRAZOLE SODIUM 40 MG PO TBEC
40.0000 mg | DELAYED_RELEASE_TABLET | Freq: Every day | ORAL | Status: DC
  Administered 2019-07-28 – 2019-07-30 (×3): 40 mg via ORAL
  Filled 2019-07-28 (×3): qty 1

## 2019-07-28 MED ORDER — PROMETHAZINE HCL 12.5 MG PO TABS
12.5000 mg | ORAL_TABLET | Freq: Four times a day (QID) | ORAL | Status: DC | PRN
  Administered 2019-07-29: 12.5 mg via ORAL
  Filled 2019-07-28: qty 1

## 2019-07-28 MED ORDER — INSULIN ASPART 100 UNIT/ML ~~LOC~~ SOLN
0.0000 [IU] | Freq: Three times a day (TID) | SUBCUTANEOUS | Status: DC
  Administered 2019-07-29: 5 [IU] via SUBCUTANEOUS
  Administered 2019-07-29 (×2): 3 [IU] via SUBCUTANEOUS

## 2019-07-28 MED ORDER — ROSUVASTATIN CALCIUM 20 MG PO TABS
40.0000 mg | ORAL_TABLET | Freq: Every morning | ORAL | Status: DC
  Administered 2019-07-29 – 2019-07-30 (×2): 40 mg via ORAL
  Filled 2019-07-28 (×2): qty 2

## 2019-07-28 MED ORDER — LINACLOTIDE 145 MCG PO CAPS
145.0000 ug | ORAL_CAPSULE | Freq: Every day | ORAL | Status: DC
  Administered 2019-07-29: 145 ug via ORAL
  Filled 2019-07-28: qty 1

## 2019-07-28 NOTE — ED Notes (Signed)
Pt drinking water at this time.

## 2019-07-28 NOTE — ED Triage Notes (Signed)
Pt was sent to the ER by her PCP for hypotension.

## 2019-07-28 NOTE — ED Provider Notes (Signed)
[facility] EMERGENCY DEPARTMENT Provider Note   CSN: [id number] Arrival date & time: 07/28/19  1222    History Chief Complaint  Patient presents with  . Hypotension    [name] is a 64 y.o. female with past medical history significant for anemia, PAD, MI, CVA with chronic left-sided weakness, GI bleed, vertigo who presents for evaluation of not feeling well.  Patient states she has had nausea over the last 3 days.  States she has had very little p.o. intake however stable to take small sips of liquids with some dried crackers.  Admits to what she calls "vertigo."  She states she has had these previously.  She denies any sensation of room feeling or just describes things more feeling "lightheaded."  She denies any recent falls, injuries or syncope.  States she took something over-the-counter for her nausea however does not remember the name.  She denies any headache, vision changes, photophobia, photophobia, neck pain, neck stiffness, difficulty with word finding, facial droop, or blood per rectum at that time.  unilateral weakness, chest pain, shortness of breath, abdominal pain, diarrhea, dysuria.  Patient states last bowel movement approximately 5 days ago.  Patient states she normally only goes once weekly.  Denies any melena.  Denies additional aggravating or alleviating factors.  Sent by PCP Dr. Nevada Crane today for hypertension and dehydration.  History obtained from patient and past medical records.  No interpreter is used.  HPI     Past Medical History:  Diagnosis Date  . 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 Anemia    hx  . Angioedema 07/10/2015  . Anxiety   . Arthritis    "neck, left hand" (09/14/2012)  . Asthma   . Cancer ([place]) 1985   ovarian, no treatment except surgery  . Complication of anesthesia    OCCASIONAL TROUBLE TURNING NECK TO RIGHT  . Critical lower limb ischemia    10/2014 s/p L SFA stenting  . Diverticulosis of colon with hemorrhage April 2013  . GERD  (gastroesophageal reflux disease)   . H/O hiatal hernia   . Heart attack [facility])    2003 mild MI, March 2013 mild MI  . Hidradenitis    groin  . History of blood transfusion 1985 AND 2013  . Hyperlipidemia   . Hypertension   . Irritable bowel syndrome   . Left-sided weakness    since stroke, left eye trouble seeing  . Migraines   . Mild CAD    a. Cath 09/2010: mild luminal irregularities of LAD, 30% prox RCA and 20-30% mRCA, EF 65%.  . Neuropathy   . Obesity   . PAD (peripheral artery disease) ([place])    a. critical limb ischemia s/p PTA/stenting of L SFA 10/2014. c. occ prior SFA stent by angio 01/2016, for possible PV bypass.  . Pneumonia    baby  . Recurrent upper respiratory infection (URI)   . S/P arterial stent-mid Lt SFA 11/03/14 11/04/2014  . Schatzki's ring   . Sinus problem   . Stomach ulcer 1972   non-bleeding  . Stroke ([place]) 07-2007, 07-2008, 07-2009   total 3 strokes; mild left sided weakness and left eye "jumps".  . Type II diabetes mellitus ([place])   . Vitamin B 12 deficiency 07-15-2013    Patient Active Problem List   Diagnosis Date Noted  . AKI (acute kidney injury) ([place]) 07/28/2019  . GI bleed 06/30/2019  . Hypokalemia 06/28/2019  . Generalized weakness 06/28/2019  . Syncope and collapse 06/20/2019  . Tobacco  abuse 05/04/2019  . Diabetic gastroparesis associated with type 2 diabetes mellitus ([place]) 11/25/2017  . Precordial chest pain 12/24/2016  . Nausea & vomiting 12/24/2016  . PAD (peripheral artery disease) ([place]) 06/19/2016  . Diabetic neuropathy ([place]) 01/31/2016  . Obesity 01/31/2016  . Mild CAD   . Claudication ([place]) 01/29/2016  . Food allergy 08/02/2015  . Recurrent urticaria 07/10/2015  . Angioedema 07/10/2015  . Perennial allergic rhinitis with a possible non-allergic component 07/10/2015  . History of asthma 07/10/2015  . S/P femoral-popliteal bypass surgery 11/04/2014  . Critical lower limb ischemia 11/03/2014  . Peripheral arterial disease ([place])  11/02/2014  . HNP (herniated nucleus pulposus) with myelopathy, cervical 04/11/2014  . HNP (herniated nucleus pulposus), cervical 04/08/2014  . Pancreatitis 01/02/2013  . Abdominal pain, epigastric 12/14/2012  . IBS (irritable bowel syndrome) 09/12/2011  . GERD (gastroesophageal reflux disease) 09/12/2011  . Diverticulosis of colon with hemorrhage 08/10/2011  . Chest pain at rest 07/06/2011    Class: Acute  . Hidradenitis, left pubic area and left lower quadrant 01/18/2011  . DM2 (diabetes mellitus, type 2) ([place]) 08/24/2007  . Hyperlipidemia LDL goal <70 08/24/2007  . Essential hypertension 08/24/2007    Past Surgical History:  Procedure Laterality Date  . ABDOMINAL HYSTERECTOMY  1985  . ADENOIDECTOMY    . ANTERIOR CERVICAL DECOMP/DISCECTOMY FUSION  2002  . ANTERIOR CERVICAL DECOMP/DISCECTOMY FUSION N/A 04/08/2014   Procedure: Cervical Six-Seven ANTERIOR CERVICAL DECOMPRESSION/DISCECTOMY FUSION Plating and Bonegraft  2 LEVELS;  Surgeon: Ashok Pall, MD;  Location: [place] NEURO ORS;  Service: Neurosurgery;  Laterality: N/A;  Cervical Six-Seven ANTERIOR CERVICAL DECOMPRESSION/DISCECTOMY FUSION Plating and Bonegraft  2 LEVELS  . APPENDECTOMY  1985  . AXILLARY HIDRADENITIS EXCISION  1990-2008   bilateral  . BACK SURGERY    . BREAST BIOPSY Right 2007  . BREAST CYST EXCISION Right 2008  . BREAST REDUCTION SURGERY    . CARDIAC CATHETERIZATION  2004   mild disease  . CATARACT EXTRACTION W/PHACO Left 05/16/2015   Procedure: CATARACT EXTRACTION PHACO AND INTRAOCULAR LENS PLACEMENT (IOC);  Surgeon: Rutherford Guys, MD;  Location: [facility];  Service: Ophthalmology;  Laterality: Left;  CDE: 4.24  . CATARACT EXTRACTION W/PHACO Right 05/30/2015   Procedure: CATARACT EXTRACTION RIGHT EYE PHACO AND INTRAOCULAR LENS PLACEMENT ;  Surgeon: Rutherford Guys, MD;  Location: [facility];  Service: Ophthalmology;  Laterality: Right;  CDE:4.08  . CHOLECYSTECTOMY  1990's  . COLONOSCOPY  08/12/2011   Procedure: COLONOSCOPY;   Surgeon: Ladene Artist, MD,FACG;  Location[facility] ENDOSCOPY;  Service: Endoscopy;  Laterality: N/A;  . COLONOSCOPY  06/19/2006  . COLONOSCOPY WITH PROPOFOL N/A 07/02/2019   Procedure: COLONOSCOPY WITH PROPOFOL;  Surgeon: Rogene Houston, MD;  Location[facility];  Service: Endoscopy;  Laterality: N/A;  . cyst thigh Right   . ESOPHAGOGASTRODUODENOSCOPY  08/12/2011   Procedure: ESOPHAGOGASTRODUODENOSCOPY (EGD);  Surgeon: Ladene Artist, MD,FACG;  Location: [facility] ENDOSCOPY;  Service: Endoscopy;  Laterality: N/A;  . ESOPHAGOGASTRODUODENOSCOPY  06/04/2005  . ESOPHAGOGASTRODUODENOSCOPY (EGD) WITH PROPOFOL N/A 07/01/2019   Procedure: ESOPHAGOGASTRODUODENOSCOPY (EGD) WITH PROPOFOL;  Surgeon: Rogene Houston, MD;  [facility];  Service: Endoscopy;  Laterality: N/A;  . FEMORAL-POPLITEAL BYPASS GRAFT Left 06/19/2016   Procedure: Left Leg BYPASS GRAFT FEMORAL-POPLITEAL ARTERY;  Surgeon: Serafina Mitchell, MD;  Location: [place];  Service: Vascular;  Laterality: Left;  . GIVENS CAPSULE STUDY  08/13/2011   Procedure: GIVENS CAPSULE STUDY;  Surgeon: Ladene Artist, MD,FACG;  Location: [facility] ENDOSCOPY;  Service: Endoscopy;  Laterality: N/A;  . HAMMER TOE SURGERY Bilateral ~ 2000  . HEMOSTASIS CLIP PLACEMENT  07/02/2019   Procedure: HEMOSTASIS CLIP PLACEMENT;  Surgeon: Rogene Houston, MD;  [facility];  Service: Endoscopy;;  hepatic flexure  . HIATAL HERNIA REPAIR    . HYDRADENITIS EXCISION  01/2011; 03/2012   'groin and abdomen; 03/2012" (09/14/2012)  . HYDRADENITIS EXCISION  04/01/2012   Procedure: EXCISION HYDRADENITIS GROIN;  Surgeon: Pedro Earls, MD;  Location: [facility];  Service: General;  Laterality: Bilateral;  Excision of Hydradenitis of Perineum  . HYDRADENITIS EXCISION N/A 09/17/2013   Procedure: EXCISION PERINEAL HIDRADENITIS ;  Surgeon: Pedro Earls, MD;  Location: [facility];  Service: General;  Laterality: N/A;  also in the pubis area  . LEFT HEART CATH AND CORONARY ANGIOGRAPHY N/A  12/26/2016   Procedure: LEFT HEART CATH AND CORONARY ANGIOGRAPHY;  Surgeon: [name], Peter M, MD;  Location: [place] CV LAB;  Service: Cardiovascular;  Laterality: N/A;  . MASS EXCISION Right 09/17/2013   Procedure: EXCISION MASS;  Surgeon: Pedro Earls, MD;  Location: [facility];  Service: General;  Laterality: Right;  . NISSEN FUNDOPLICATION  [id number]  . PERIPHERAL VASCULAR CATHETERIZATION N/A 11/03/2014   Procedure: Lower Extremity Angiography;  Surgeon: Lorretta Harp, MD;  Location: [place] CV LAB;  Service: Cardiovascular;  Laterality: N/A;  . PERIPHERAL VASCULAR CATHETERIZATION N/A 01/29/2016   Procedure: Lower Extremity Angiography;  Surgeon: Lorretta Harp, MD;  Location: [place] CV LAB;  Service: Cardiovascular;  Laterality: N/A;  . POLYPECTOMY  07/02/2019   Procedure: POLYPECTOMY;  Surgeon: Rogene Houston, MD;  [facility];  Service: Endoscopy;;  . POSTERIOR LUMBAR FUSION  2008 X 2  . REDUCTION MAMMAPLASTY  1996?  . TONSILLECTOMY AND ADENOIDECTOMY  1959 AND 2000  . UVULOPALATOPHARYNGOPLASTY, TONSILLECTOMY AND SEPTOPLASTY  2000's     OB History   No obstetric history on file.     Family History  Problem Relation Age of Onset  . Breast cancer Mother   . Heart disease Mother   . Hypertension Mother   . Diabetes Father   . Heart disease Father   . Stroke Father   . Heart disease Sister   . Hypertension Sister   . Hypertension Sister   . Hyperlipidemia Sister   . Diabetes Sister   . Allergic rhinitis Sister   . Emphysema Other        great uncle  . Aneurysm Sister        brain  . Angioedema Neg Hx   . Asthma Neg Hx   . Eczema Neg Hx   . Immunodeficiency Neg Hx   . Urticaria Neg Hx     Social History   Tobacco Use  . Smoking status: Former Smoker    Packs/day: 0.50    Years: 41.00    Pack years: 20.50    Types: Cigarettes    Quit date: 01/15/2016    Years since quitting: 3.5  . Smokeless tobacco: Never Used  . Tobacco comment: Getting  ready to start nicotine patches RX by Dr. Gwenlyn Found per pt.  Substance Use Topics  . Alcohol use: No    Alcohol/week: 0.0 standard drinks  . Drug use: No    Types: "Crack" cocaine    Comment: 05/09/2015.  "quit 07/27/1994"    Home Medications Prior to Admission medications   Medication Sig Start Date End Date Taking? Authorizing Provider  acetaminophen (TYLENOL) 325 MG tablet Take 2 tablets (650  mg total) by mouth every 6 (six) hours as needed for mild pain (or Fever >/= 101). 07/02/19  Yes Emokpae, Courage, MD  albuterol (PROAIR HFA) 108 (90 Base) MCG/ACT inhaler 1-2 inhalations every 4-6 hours as needed for cough or wheeze. Patient taking differently: Inhale 1-2 puffs into the lungs every 4 (four) hours as needed for shortness of breath.  07/10/15  Yes Bobbitt, Sedalia Muta, MD  ALPRAZolam Duanne Moron) 0.5 MG tablet Take 0.5 mg by mouth 2 (two) times daily as needed for anxiety.   Yes [provider]  Azelastine HCl 0.15 % SOLN Place 2 sprays into both nostrils 2 (two) times daily. Patient taking differently: Place 2 sprays into both nostrils 2 (two) times daily as needed (allergies).  07/10/15  Yes Bobbitt, Sed[place] Muta, MD  citalopram (CELEXA) 10 MG tablet Take 10 mg by mouth daily as needed.    Yes [provider]  clopidogrel (PLAVIX) 75 MG tablet Take 1 tablet (75 mg total) by mouth daily. 07/03/19  Yes Emokpae, Courage, MD  EPINEPHrine 0.3 mg/0.3 mL IJ SOAJ injection Inject 0.3 mLs (0.3 mg total) into the muscle once. Patient taking differently: Inject 0.3 mg into the muscle as needed for anaphylaxis.  08/02/15  Yes Bobbitt, Sedalia Muta, MD  HYDROcodone-acetaminophen (NORCO) 7.5-325 MG tablet Take 1 tablet by mouth daily as needed for moderate pain.  03/09/19  Yes [provider]  insulin lispro (HUMALOG) 100 UNIT/ML injection Inject 5-12 Units into the skin 2 (two) times daily before a meal. Per sliding scale using OMNIPOD   Yes [provider]  linaclotide (LINZESS)  145 MCG CAPS capsule Take 1 capsule (145 mcg total) by mouth daily before breakfast. 07/19/19  Yes Rehman, Mechele Dawley, MD  mupirocin ointment (BACTROBAN) 2 % Apply 1 application topically 2 (two) times daily. Patient taking differently: Apply 1 application topically 2 (two) times daily as needed.  06/02/19  Yes Wurst, [place], PA-C  nitroGLYCERIN (NITROSTAT) 0.4 MG SL tablet PLACE 1 TAB UNDER TONGUE EVERY 5 MINS AS NEEDED FOR CHEST PAIN - MAX 3 DOSES THEN 911 Patient taking differently: Place 0.4 mg under the tongue every 5 (five) minutes as needed for chest pain. MAX 3 DOSES THEN 911 11/30/18  Yes Barrett, Evelene Croon, PA-C  pantoprazole (PROTONIX) 40 MG tablet TAKE 1 TABLET BY MOUTH EVERY DAY Patient taking differently: Take 40 mg by mouth daily.  01/07/17  Yes Lorretta Harp, MD  propranolol ER (INDERAL LA) 60 MG 24 hr capsule Take 1 capsule (60 mg total) by mouth every evening. 07/02/19  Yes Emokpae, Courage, MD  rosuvastatin (CRESTOR) 40 MG tablet Take 1 tablet (40 mg total) by mouth every morning. 07/02/19  Yes Emokpae, Courage, MD  SUMAtriptan (IMITREX) 100 MG tablet Take 100 mg by mouth every 2 (two) hours as needed for migraine.    Yes [provider]  temazepam (RESTORIL) 30 MG capsule Take 30 mg by mouth at bedtime as needed for sleep.   Yes [provider]  traZODone (DESYREL) 100 MG tablet Take 1 tablet (100 mg total) by mouth at bedtime. 07/02/19  Yes Roxan Hockey, MD  aspirin EC 81 MG tablet Take 1 tablet (81 mg total) by mouth daily with breakfast. Patient not taking: Reported on 07/28/2019 07/03/19   Roxan Hockey, MD  cloNIDine (CATAPRES) 0.1 MG tablet Take 1 tablet (0.1 mg total) by mouth at bedtime. Patient not taking: Reported on 07/28/2019 07/02/19   Roxan Hockey, MD  losartan (COZAAR) 50 MG tablet Take  1 tablet (50 mg total) by mouth daily. Patient not taking: Reported on 07/28/2019 07/02/19   Roxan Hockey, MD    Allergies    Sulfa antibiotics, Codeine,  Fish allergy, Iodine, Metformin and related, Soliqua [insulin glargine-lixisenatide], Adhesive [tape], and Shellfish allergy  Review of Systems   Review of Systems  Constitutional: Positive for activity change, appetite change and fatigue.  HENT: Negative.   Respiratory: Negative.   Cardiovascular: Negative.   Gastrointestinal: Positive for nausea. Negative for abdominal distention, abdominal pain, anal bleeding, blood in stool, constipation, diarrhea, rectal pain and vomiting.  Genitourinary: Negative.   Musculoskeletal: Negative.   Skin: Negative.   Neurological: Positive for weakness (Generalized) and light-headedness. Negative for dizziness, seizures, syncope, speech difficulty, numbness and headaches.  All other systems reviewed and are negative.   Physical Exam Updated Vital Signs BP 104/71   Pulse (!) 109   Temp 98 F (36.7 C) (Oral)   Resp 13   Ht 5\' 4"  (1.626 m)   Wt 60.1 kg   SpO2 96%   BMI 22.76 kg/m   Physical Exam Vitals and nursing note reviewed.  Constitutional:      General: She is not in acute distress.    Appearance: She is well-developed. She is not toxic-appearing.  HENT:     Head: Normocephalic and atraumatic.     Nose: Nose normal.     Mouth/Throat:     Mouth: Mucous membranes are dry.  Eyes:     Pupils: Pupils are equal, round, and reactive to light.     Comments: No horizontal, vertical or rotational nystagmus   Neck:     Trachea: Phonation normal.     Comments: Full active and passive ROM without pain No midline or paraspinal tenderness No nuchal rigidity or meningeal signs  Cardiovascular:     Rate and Rhythm: Tachycardia present.     Pulses: Normal pulses.     Heart sounds: Normal heart sounds.     Comments: HR 108 Pulmonary:     Effort: Pulmonary effort is normal. No respiratory distress.     Breath sounds: Normal breath sounds.  Abdominal:     General: Bowel sounds are normal. There is no distension.     Tenderness: There is no  abdominal tenderness. There is no right CVA tenderness, left CVA tenderness, guarding or rebound.  Musculoskeletal:        General: Normal range of motion.     Cervical back: Full passive range of motion without pain and normal range of motion.     Comments: Moves all 4 extremities without difficulty  Skin:    General: Skin is warm and dry.     Capillary Refill: Capillary refill takes less than 2 seconds.  Neurological:     Mental Status: She is alert.     Comments: Mental Status:  Alert, oriented, thought content appropriate. Speech fluent without evidence of aphasia. Able to follow 2 step commands without difficulty.  Cranial Nerves:  II:  Peripheral visual fields grossly normal, pupils equal, round, reactive to light III,IV, VI: ptosis not present, extra-ocular motions intact bilaterally  V,VII: smile symmetric, facial light touch sensation equal VIII: hearing grossly normal bilaterally  IX,X: midline uvula rise  XI: bilateral shoulder shrug equal and strong XII: midline tongue extension  Motor:  5/5 in upper and lower extremities to right 4/5 left (at baseline from prior CVA) including strong and equal grip strength and dorsiflexion/plantar flexion Sensory: Pinprick and light touch normal in all extremities.  Deep Tendon Reflexes: 2+ and symmetric  Cerebellar: normal finger-to-nose with bilateral upper extremities Gait: normal gait and balance CV: distal pulses palpable throughout      ED Results / Procedures / Treatments   Labs (all labs ordered are listed, but only abnormal results are displayed) Labs Reviewed  CBC WITH DIFFERENTIAL/PLATELET - Abnormal; Notable for the following components:      Result Value   WBC 11.0 (*)    Neutro Abs 8.7 (*)    All other components within normal limits  COMPREHENSIVE METABOLIC PANEL - Abnormal; Notable for the following components:   Sodium 129 (*)    Potassium 2.2 (*)    Chloride 87 (*)    Glucose, Bld 260 (*)    BUN 28 (*)     Creatinine, Ser 1.84 (*)    Total Bilirubin 1.8 (*)    GFR calc non Af Amer 28 (*)    GFR calc Af Amer 33 (*)    All other components within normal limits  TROPONIN I (HIGH SENSITIVITY) - Abnormal; Notable for the following components:   Troponin I (High Sensitivity) 36 (*)    All other components within normal limits  CULTURE, BLOOD (ROUTINE X 2)  CULTURE, BLOOD (ROUTINE X 2)  SARS CORONAVIRUS 2 (TAT 6-24 HRS)  LACTIC ACID, PLASMA  LACTIC ACID, PLASMA  MAGNESIUM  TROPONIN I (HIGH SENSITIVITY)    EKG EKG Interpretation  Date/Time:  Wednesday July 28 2019 13:25:45 EDT Ventricular Rate:  103 PR Interval:    QRS Duration: 95 QT Interval:  407 QTC Calculation: 533 R Axis:   -23 Text Interpretation: Sinus tachycardia Abnormal R-wave progression, early transition LVH with secondary repolarization abnormality Prolonged QT interval No STEMI Confirmed by Nanda Quinton [phone]) on 07/28/2019 1:31:18 PM   Radiology DG Chest 2 View  Result Date: 07/28/2019 CLINICAL DATA:  Hypotension. EXAM: CHEST - 2 VIEW COMPARISON:  CT scan June 20, 2019 FINDINGS: A rounded density over the periphery of the left lung base is consistent with known calcifications in the left breast. No suspicious nodules were seen in this region on the comparison CT scan. No pneumothorax. No infiltrates or masses. The cardiomediastinal silhouette is unremarkable. IMPRESSION: No active cardiopulmonary disease. Electronically Signed   By: Dorise Bullion III M.D   On: 07/28/2019 14:07    Procedures .Critical Care Performed by: Nettie Elm, PA-C Authorized by: Nettie Elm, PA-C   Critical care provider statement:    Critical care time (minutes):  45   Critical care was necessary to treat or prevent imminent or life-threatening deterioration of the following conditions:  Metabolic crisis and dehydration   Critical care was time spent personally by me on the following activities:  Discussions with  consultants, evaluation of patient's response to treatment, examination of patient, ordering and performing treatments and interventions, ordering and review of laboratory studies, ordering and review of radiographic studies, pulse oximetry, re-evaluation of patient's condition, obtaining history from patient or surrogate and review of old charts   (including critical care time)  Medications Ordered in ED Medications  potassium chloride 10 mEq in 100 mL IVPB (10 mEq Intravenous New Bag/Given 07/28/19 1617)  acetaminophen (TYLENOL) tablet 650 mg (has no administration in time range)  potassium chloride SA (KLOR-CON) CR tablet 40 mEq (has no administration in time range)  sodium chloride 0.9 % bolus 1,000 mL (1,000 mLs Intravenous New Bag/Given 07/28/19 1429)  ondansetron (ZOFRAN) injection 4 mg (4 mg Intravenous Given 07/28/19 1345)  ED Course  I have reviewed the triage vital signs and the nursing notes.  Pertinent labs & imaging results that were available during my care of the patient were reviewed by me and considered in my medical decision making (see chart for details).  64 year old female appears chronically ill presents for evaluation of hypotension.  She is afebrile, nonseptic appearing.  Has had some nausea decreased p.o. intake over the last 3 days.  Admits to some lightheadedness which is worse with ambulation.  She denies any syncopal events or falls.  She has no chest pain or shortness of breath.  She is mildly tachycardic.  Her heart and lungs are clear.  She does have some baseline decreased strength to her left upper and lower extremity from prior CVA however her exam seems stable from prior ED visits.  She was admitted 2 times in February for similar complaints.  At that time had Holter monitor. Cannot do MR 2/2 metal.  Patient evaluated by Dr. Laverta Baltimore.  Recommend serial troponins, EKG, basic labs, fluids.  Does not think she needs imaging at this time which I agree with.  Patient  reassessed. BP improved to 123456 systolic. Continued non focal neuro exam.   Labs and imaging personally reviewed and interpreted: CBC without leukocytosis to 12 Metabolic panel with hyponatremia to 129, hypokalemia to 2.2, hyperglycemia to 160, creatinine 1.84, increased from baseline 1.0, GFR 33 down from greater than 60 Lactic acid 1.9 Troponin 36, prior 33,36. Pending delta trop  Patient reassessed.  Would like to try p.o. challenge.  States she does have some back pain however this is chronic in nature.  Will give some Tylenol.  Again denies any recent falls or any red flags for back pain.  Discussed her significant hypokalemia to 2.2 and her AKI.  Feel patient would benefit for some continued fluids as well as potassium replacement.  Other labs appear to be at baseline.  Low suspicion for sepsis, ACS, CVA, PE, bowel obstruction or perforation, bacterial infectious process as cause of her symptoms.  Will consult hospitalist for admission.  CONSULT with Dr. Denton Brick with [facility] who will evaluate patient for admission.     MDM Rules/Calculators/A&P                      Final Clinical Impression(s) / ED Diagnoses Final diagnoses:  AKI (acute kidney injury) ([place])  Hypokalemia  Weakness  Elevated troponin  Orthostatic hypotension  Nausea    Rx / DC Orders ED Discharge Orders    None       Sandie Swayze A, PA-C 07/28/19 1640    Long, Wonda Old[facility], MD 08/01/19 [phone]

## 2019-07-28 NOTE — H&P (Addendum)
History and Physical    [name] [id number] DOB: 11-16-1955 DOA: 07/28/2019  PCP: [name], MD   Patient coming from: Home  I have personally briefly reviewed patient's old medical records in [place]  Chief Complaint: Low blood pressure  HPI: [name] is a 64 y.o. female with medical history significant for coronary artery disease, critical limb ischemia status post femoropopliteal bypass, diabetes mellitus, hypertension, CVA.  Patient was sent to the ED from her primary care provider's office with reports of low blood pressure, patient's blood pressure systolic was in the [id number]. Patient reports dizziness that started today.  She she reports that she has been nauseous over the past 3 days with dry heaves without real vomiting and poor p.o. intake.  No abdominal pain.  She has not had a bowel movement in about 2 weeks.  She has chronic constipation and usually has 1 bowel movement per week.  No cough or difficulty breathing, no chest pain, no fever or chills, no pain with urination.  Recent hospitalization 2/22-2/26 for possible acute blood loss anemia, possible GI source, endoscopy and colonoscopy unrevealing.  Hospitalization 2/1-2/15 for syncope thought cardiogenic versus polypharmacy.  Complete syncope work-up included CTA chest, carotid Dopplers, echocardiogram were essentially unrevealing, patient was discharged with a plan for outpatient Holter monitoring.  Also hypokalemic, thought secondary to chlorthalidone which was discontinued.  Patient is sure she has not been taking her chlorthalidone since this was discontinued.  ED Course: Systolic 84 improved to low 100s.  Potassium low 2.2.  Sodium low 129.  Creatinine elevated 1.8. HS troponin 36.  Chest x-ray clear.  EKG showing prolonged QTC of 533, with some ST abnormalities.  1 L bolus given, potassium supplementation ordered IV.  Hospitalist to admit for further evaluation and management.  Review of Systems:  As per HPI all other systems reviewed and negative.  Past Medical History:  Diagnosis Date  . 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 Anemia    hx  . Angioedema 07/10/2015  . Anxiety   . Arthritis    "neck, left hand" (09/14/2012)  . Asthma   . Cancer ([place]) 1985   ovarian, no treatment except surgery  . Complication of anesthesia    OCCASIONAL TROUBLE TURNING NECK TO RIGHT  . Critical lower limb ischemia    10/2014 s/p L SFA stenting  . Diverticulosis of colon with hemorrhage April 2013  . GERD (gastroesophageal reflux disease)   . H/O hiatal hernia   . Heart attack [facility])    2003 mild MI, March 2013 mild MI  . Hidradenitis    groin  . History of blood transfusion 1985 AND 2013  . Hyperlipidemia   . Hypertension   . Irritable bowel syndrome   . Left-sided weakness    since stroke, left eye trouble seeing  . Migraines   . Mild CAD    a. Cath 09/2010: mild luminal irregularities of LAD, 30% prox RCA and 20-30% mRCA, EF 65%.  . Neuropathy   . Obesity   . PAD (peripheral artery disease) ([place])    a. critical limb ischemia s/p PTA/stenting of L SFA 10/2014. c. occ prior SFA stent by angio 01/2016, for possible PV bypass.  . Pneumonia    baby  . Recurrent upper respiratory infection (URI)   . S/P arterial stent-mid Lt SFA 11/03/14 11/04/2014  . Schatzki's ring   . Sinus problem   . Stomach ulcer 1972   non-bleeding  . Stroke ([place]) 07-2007, 07-2008, 07-2009  total 3 strokes; mild left sided weakness and left eye "jumps".  . Type II diabetes mellitus ([place])   . Vitamin B 12 deficiency 07-15-2013    Past Surgical History:  Procedure Laterality Date  . ABDOMINAL HYSTERECTOMY  1985  . ADENOIDECTOMY    . ANTERIOR CERVICAL DECOMP/DISCECTOMY FUSION  2002  . ANTERIOR CERVICAL DECOMP/DISCECTOMY FUSION N/A 04/08/2014   Procedure: Cervical Six-Seven ANTERIOR CERVICAL DECOMPRESSION/DISCECTOMY FUSION Plating and Bonegraft  2 LEVELS;  Surgeon: Ashok Pall, MD;  Location: [place] NEURO ORS;  Service:  Neurosurgery;  Laterality: N/A;  Cervical Six-Seven ANTERIOR CERVICAL DECOMPRESSION/DISCECTOMY FUSION Plating and Bonegraft  2 LEVELS  . APPENDECTOMY  1985  . AXILLARY HIDRADENITIS EXCISION  1990-2008   bilateral  . BACK SURGERY    . BREAST BIOPSY Right 2007  . BREAST CYST EXCISION Right 2008  . BREAST REDUCTION SURGERY    . CARDIAC CATHETERIZATION  2004   mild disease  . CATARACT EXTRACTION W/PHACO Left 05/16/2015   Procedure: CATARACT EXTRACTION PHACO AND INTRAOCULAR LENS PLACEMENT (IOC);  Surgeon: Rutherford Guys, MD;  Location: [facility];  Service: Ophthalmology;  Laterality: Left;  CDE: 4.24  . CATARACT EXTRACTION W/PHACO Right 05/30/2015   Procedure: CATARACT EXTRACTION RIGHT EYE PHACO AND INTRAOCULAR LENS PLACEMENT ;  Surgeon: Rutherford Guys, MD;  Location: [facility];  Service: Ophthalmology;  Laterality: Right;  CDE:4.08  . CHOLECYSTECTOMY  1990's  . COLONOSCOPY  08/12/2011   Procedure: COLONOSCOPY;  Surgeon: Ladene Artist, MD,FACG;  Location: [facility] ENDOSCOPY;  Service: Endoscopy;  Laterality: N/A;  . COLONOSCOPY  06/19/2006  . COLONOSCOPY WITH PROPOFOL N/A 07/02/2019   Procedure: COLONOSCOPY WITH PROPOFOL;  Surgeon: Rogene Houston, MD;  [facility];  Service: Endoscopy;  Laterality: N/A;  . cyst thigh Right   . ESOPHAGOGASTRODUODENOSCOPY  08/12/2011   Procedure: ESOPHAGOGASTRODUODENOSCOPY (EGD);  Surgeon: Ladene Artist, MD,FACG;  Location: [facility] ENDOSCOPY;  Service: Endoscopy;  Laterality: N/A;  . ESOPHAGOGASTRODUODENOSCOPY  06/04/2005  . ESOPHAGOGASTRODUODENOSCOPY (EGD) WITH PROPOFOL N/A 07/01/2019   Procedure: ESOPHAGOGASTRODUODENOSCOPY (EGD) WITH PROPOFOL;  Surgeon: Rogene Houston, MD;  [facility];  Service: Endoscopy;  Laterality: N/A;  . FEMORAL-POPLITEAL BYPASS GRAFT Left 06/19/2016   Procedure: Left Leg BYPASS GRAFT FEMORAL-POPLITEAL ARTERY;  Surgeon: Serafina Mitchell, MD;  Location: [place];  Service: Vascular;  Laterality: Left;  . GIVENS CAPSULE STUDY  08/13/2011    Procedure: GIVENS CAPSULE STUDY;  Surgeon: Ladene Artist, MD,FACG;  Location: [facility] ENDOSCOPY;  Service: Endoscopy;  Laterality: N/A;  . HAMMER TOE SURGERY Bilateral ~ 2000  . HEMOSTASIS CLIP PLACEMENT  07/02/2019   Procedure: HEMOSTASIS CLIP PLACEMENT;  Surgeon: Rogene Houston, MD;  [facility];  Service: Endoscopy;;  hepatic flexure  . HIATAL HERNIA REPAIR    . HYDRADENITIS EXCISION  01/2011; 03/2012   'groin and abdomen; 03/2012" (09/14/2012)  . HYDRADENITIS EXCISION  04/01/2012   Procedure: EXCISION HYDRADENITIS GROIN;  Surgeon: Pedro Earls, MD;  Location: [facility] ORS;  Service: General;  Laterality: Bilateral;  Excision of Hydradenitis of Perineum  . HYDRADENITIS EXCISION N/A 09/17/2013   Procedure: EXCISION PERINEAL HIDRADENITIS ;  Surgeon: Pedro Earls, MD;  Location: [facility] ORS;  Service: General;  Laterality: N/A;  also in the pubis area  . LEFT HEART CATH AND CORONARY ANGIOGRAPHY N/A 12/26/2016   Procedure: LEFT HEART CATH AND CORONARY ANGIOGRAPHY;  Surgeon: [name], Peter M, MD;  Location: [place] CV LAB;  Service: Cardiovascular;  Laterality: N/A;  . MASS EXCISION Right 09/17/2013  Procedure: EXCISION MASS;  Surgeon: Pedro Earls, MD;  Location: [facility] ORS;  Service: General;  Laterality: Right;  . NISSEN FUNDOPLICATION  [id number]  . PERIPHERAL VASCULAR CATHETERIZATION N/A 11/03/2014   Procedure: Lower Extremity Angiography;  Surgeon: Lorretta Harp, MD;  Location: [place] CV LAB;  Service: Cardiovascular;  Laterality: N/A;  . PERIPHERAL VASCULAR CATHETERIZATION N/A 01/29/2016   Procedure: Lower Extremity Angiography;  Surgeon: Lorretta Harp, MD;  Location: [place] CV LAB;  Service: Cardiovascular;  Laterality: N/A;  . POLYPECTOMY  07/02/2019   Procedure: POLYPECTOMY;  Surgeon: Rogene Houston, MD;  [facility];  Service: Endoscopy;;  . POSTERIOR LUMBAR FUSION  2008 X 2  . REDUCTION MAMMAPLASTY  1996?  . TONSILLECTOMY AND ADENOIDECTOMY  1959 AND 2000   . UVULOPALATOPHARYNGOPLASTY, TONSILLECTOMY AND SEPTOPLASTY  2000's     reports that she quit smoking about 3 years ago. Her smoking use included cigarettes. She has a 20.50 pack-year smoking history. She has never used smokeless tobacco. She reports that she does not drink alcohol or use drugs.  Allergies  Allergen Reactions  . Sulfa Antibiotics Shortness Of Breath and Palpitations  . 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 Fish Allergy Hives and Swelling    Tongue swelling  . Iodine Swelling  . Metformin And Related Diarrhea  . [name] [Insulin Glargine-Lixisenatide] Itching and Other (See Comments)    "tongue swelling"  . Adhesive [Tape] Rash    Paper tape is ok  . Shellfish Allergy Swelling and Rash    Tongue swelling    Family History  Problem Relation Age of Onset  . Breast cancer Mother   . Heart disease Mother   . Hypertension Mother   . Diabetes Father   . Heart disease Father   . Stroke Father   . Heart disease Sister   . Hypertension Sister   . Hypertension Sister   . Hyperlipidemia Sister   . Diabetes Sister   . Allergic rhinitis Sister   . Emphysema Other        great uncle  . Aneurysm Sister        brain  . Angioedema Neg Hx   . Asthma Neg Hx   . Eczema Neg Hx   . Immunodeficiency Neg Hx   . Urticaria Neg Hx     Prior to Admission medications   Medication Sig Start Date End Date Taking? Authorizing Provider  acetaminophen (TYLENOL) 325 MG tablet Take 2 tablets (650 mg total) by mouth every 6 (six) hours as needed for mild pain (or Fever >/= 101). 07/02/19  Yes Becci Batty, Courage, MD  albuterol (PROAIR HFA) 108 (90 Base) MCG/ACT inhaler 1-2 inhalations every 4-6 hours as needed for cough or wheeze. Patient taking differently: Inhale 1-2 puffs into the lungs every 4 (four) hours as needed for shortness of breath.  07/10/15  Yes Bobbitt, Sedalia Muta, MD  ALPRAZolam Duanne Moron) 0.5 MG tablet Take 0.5 mg by mouth 2  (two) times daily as needed for anxiety.   Yes [provider]  Azelastine HCl 0.15 % SOLN Place 2 sprays into both nostrils 2 (two) times daily. Patient taking differently: Place 2 sprays into both nostrils 2 (two) times daily as needed (allergies).  07/10/15  Yes Bobbitt, Sedalia Muta, MD  citalopram (CELEXA) 10 MG tablet Take 10 mg by mouth daily as needed.    Yes [provider]  clopidogrel (PLAVIX) 75 MG tablet Take  1 tablet (75 mg total) by mouth daily. 07/03/19  Yes Kaeley Vinje, Courage, MD  EPINEPHrine 0.3 mg/0.3 mL IJ SOAJ injection Inject 0.3 mLs (0.3 mg total) into the muscle once. Patient taking differently: Inject 0.3 mg into the muscle as needed for anaphylaxis.  08/02/15  Yes Bobbitt, Sedalia Muta, MD  HYDROcodone-acetaminophen (NORCO) 7.5-325 MG tablet Take 1 tablet by mouth daily as needed for moderate pain.  03/09/19  Yes [provider]  insulin lispro (HUMALOG) 100 UNIT/ML injection Inject 5-12 Units into the skin 2 (two) times daily before a meal. Per sliding scale using OMNIPOD   Yes [provider]  linaclotide (LINZESS) 145 MCG CAPS capsule Take 1 capsule (145 mcg total) by mouth daily before breakfast. 07/19/19  Yes Rehman, Mechele Dawley, MD  mupirocin ointment (BACTROBAN) 2 % Apply 1 application topically 2 (two) times daily. Patient taking differently: Apply 1 application topically 2 (two) times daily as needed.  06/02/19  Yes Wurst, [place], PA-C  nitroGLYCERIN (NITROSTAT) 0.4 MG SL tablet PLACE 1 TAB UNDER TONGUE EVERY 5 MINS AS NEEDED FOR CHEST PAIN - MAX 3 DOSES THEN 911 Patient taking differently: Place 0.4 mg under the tongue every 5 (five) minutes as needed for chest pain. MAX 3 DOSES THEN 911 11/30/18  Yes Barrett, Evelene Croon, PA-C  pantoprazole (PROTONIX) 40 MG tablet TAKE 1 TABLET BY MOUTH EVERY DAY Patient taking differently: Take 40 mg by mouth daily.  01/07/17  Yes Lorretta Harp, MD  propranolol ER (INDERAL LA) 60 MG 24 hr capsule Take 1  capsule (60 mg total) by mouth every evening. 07/02/19  Yes Denora Wysocki, Courage, MD  rosuvastatin (CRESTOR) 40 MG tablet Take 1 tablet (40 mg total) by mouth every morning. 07/02/19  Yes Kweli Grassel, Courage, MD  SUMAtriptan (IMITREX) 100 MG tablet Take 100 mg by mouth every 2 (two) hours as needed for migraine.    Yes [provider]  temazepam (RESTORIL) 30 MG capsule Take 30 mg by mouth at bedtime as needed for sleep.   Yes [provider]  traZODone (DESYREL) 100 MG tablet Take 1 tablet (100 mg total) by mouth at bedtime. 07/02/19  Yes Roxan Hockey, MD  aspirin EC 81 MG tablet Take 1 tablet (81 mg total) by mouth daily with breakfast. Patient not taking: Reported on 07/28/2019 07/03/19   Roxan Hockey, MD  cloNIDine (CATAPRES) 0.1 MG tablet Take 1 tablet (0.1 mg total) by mouth at bedtime. Patient not taking: Reported on 07/28/2019 07/02/19   Roxan Hockey, MD  losartan (COZAAR) 50 MG tablet Take 1 tablet (50 mg total) by mouth daily. Patient not taking: Reported on 07/28/2019 07/02/19   Roxan Hockey, MD    Physical Exam: Vitals:   07/28/19 1515 07/28/19 1530 07/28/19 1545 07/28/19 1600  BP:  100/65  104/71  Pulse:      Resp: 19 20 18 13   Temp:      TempSrc:      SpO2:      Weight:      Height:        Constitutional: NAD, calm, comfortable Vitals:   07/28/19 1515 07/28/19 1530 07/28/19 1545 07/28/19 1600  BP:  100/65  104/71  Pulse:      Resp: 19 20 18 13   Temp:      TempSrc:      SpO2:      Weight:      Height:       Eyes: PERRL, lids and conjunctivae normal ENMT: Mucous membranes are dry.  Neck: normal, supple, no masses, no thyromegaly Respiratory: clear to auscultation bilaterally, no wheezing, no crackles. Normal respiratory effort. No accessory muscle use.  Cardiovascular: Regular rate and rhythm, no murmurs / rubs / gallops. No extremity edema. 2+ pedal pulses.  Abdomen: no tenderness, no masses palpated. No hepatosplenomegaly. Bowel sounds  positive.  Musculoskeletal: no clubbing / cyanosis. No joint deformity upper and lower extremities. Good ROM, no contractures. Normal muscle tone.  Skin: no rashes, lesions, ulcers. No induration Neurologic: No apparent cranial nerve abnormality, 5/5 strength in all extremities, reports mild subjective weakness on the left.  Psychiatric: Normal judgment and insight. Alert and oriented x 3. Normal mood.   Labs on Admission: I have personally reviewed following labs and imaging studies  CBC: Recent Labs  Lab 07/28/19 1430  WBC 11.0*  NEUTROABS 8.7*  HGB 13.4  HCT 42.4  MCV 86.2  PLT [id number]   Basic Metabolic Panel: Recent Labs  Lab 07/28/19 1430  NA 129*  K 2.2*  CL 87*  CO2 29  GLUCOSE 260*  BUN 28*  CREATININE 1.84*  CALCIUM 9.3    Liver Function Tests: Recent Labs  Lab 07/28/19 1430  AST 26  ALT 22  ALKPHOS 83  BILITOT 1.8*  PROT 7.4  ALBUMIN 4.0    Radiological Exams on Admission: DG Chest 2 View  Result Date: 07/28/2019 CLINICAL DATA:  Hypotension. EXAM: CHEST - 2 VIEW COMPARISON:  CT scan June 20, 2019 FINDINGS: A rounded density over the periphery of the left lung base is consistent with known calcifications in the left breast. No suspicious nodules were seen in this region on the comparison CT scan. No pneumothorax. No infiltrates or masses. The cardiomediastinal silhouette is unremarkable. IMPRESSION: No active cardiopulmonary disease. Electronically Signed   By: Dorise Bullion III M.D   On: 07/28/2019 14:07    EKG: Independently reviewed.  Sinus tachycardia, QTC prolonged at 533.  St depression in lead I, aVL and V6, which is slightly more pronounced compared to prior EKG.  Assessment/Plan Principal Problem:   AKI (acute kidney injury) ([place]) Active Problems:   DM2 (diabetes mellitus, type 2) ([place])   Essential hypertension   S/P femoral-popliteal bypass surgery   Diabetic gastroparesis associated with type 2 diabetes mellitus ([place])   Acute kidney  injury-creatinine 1.8, baseline 1 - 1.1, with hypotension suggesting prerenal etiology, possibly dehydration-poor p.o. intake, in the setting of ARB use.  Chlorthalidone discontinued after hospitalization last month.  Patient is sure she is not currently taking chlorthalidone. - 1 L bolus given, continue N/s + 40 KCL 100cc/hr x 20hrs -Monitor potassium closely, BMP a.m -Patient's blood pressure medication may need adjustment on discharge -Hold home losartan, clonidine, propranolol for now  Electrolyte abnormality-hyponatremia 129 (baseline mid to high 130s), hypokalemia 2.2 (with EKG abnormalities of mildly depressed ST segment- consistent with hypokalemia). Both suggesting more of diuretic use, but patient denies ongoing chlorthalidone use.  Hypokalemia of 2.2 quite severe to be attributed to just poor p.o. intake. -Potassium containing IV fluids, replete electrolytes IV, and p.o. -Obtain urine potassium level, rule out potassium wasting nephropathy.  Prolonged QTC-533, likely due to marked hypokalemia. -Recheck EKG in a.m.  CVA history-with residual subjective/mild left-sided weakness, unchanged and stable.  Ambulates with walker or cane at baseline. -Resume aspirin, Plavix, Crestor  CAD history - mild CAD per cardiac catheterization performed 12/26/2016.  EKG shows some ST depression more pronounced than prior, likely 2/2 hypokalemia.  No chest pain. Hs troponin- 36, consistent with prior.  Follows  with Dr. Roderic Palau. -Resume home aspirin Plavix Crestor.  Peripheral artery disease status post femoropopliteal bypass. -Resume aspirin Plavix Crestor.  Diabetes mellitus-recent hemoglobin A1c 9.1.  Random glucose today 260.  Allergy to insulin glargine. - SSI- M  Reports recent weight loss- ??  Related to have hypokalemia. -Will need to follow-up with primary care provider -Obtain TSH   DVT prophylaxis: Heparin Code Status: Full code Family Communication: None at bedside Disposition  Plan:  1- 2 days Consults called: none Admission status: Observation, telemetry   Bethena Roys MD [facility]  07/28/2019, 5:59 PM

## 2019-07-29 DIAGNOSIS — N179 Acute kidney failure, unspecified: Secondary | ICD-10-CM | POA: Diagnosis not present

## 2019-07-29 DIAGNOSIS — E876 Hypokalemia: Secondary | ICD-10-CM | POA: Diagnosis present

## 2019-07-29 DIAGNOSIS — I2510 Atherosclerotic heart disease of native coronary artery without angina pectoris: Secondary | ICD-10-CM | POA: Diagnosis present

## 2019-07-29 DIAGNOSIS — K3184 Gastroparesis: Secondary | ICD-10-CM | POA: Diagnosis present

## 2019-07-29 DIAGNOSIS — I129 Hypertensive chronic kidney disease with stage 1 through stage 4 chronic kidney disease, or unspecified chronic kidney disease: Secondary | ICD-10-CM | POA: Diagnosis present

## 2019-07-29 DIAGNOSIS — H539 Unspecified visual disturbance: Secondary | ICD-10-CM | POA: Diagnosis present

## 2019-07-29 DIAGNOSIS — N1831 Chronic kidney disease, stage 3a: Secondary | ICD-10-CM | POA: Diagnosis present

## 2019-07-29 DIAGNOSIS — E785 Hyperlipidemia, unspecified: Secondary | ICD-10-CM | POA: Diagnosis present

## 2019-07-29 DIAGNOSIS — E1143 Type 2 diabetes mellitus with diabetic autonomic (poly)neuropathy: Secondary | ICD-10-CM | POA: Diagnosis present

## 2019-07-29 DIAGNOSIS — I69398 Other sequelae of cerebral infarction: Secondary | ICD-10-CM | POA: Diagnosis not present

## 2019-07-29 DIAGNOSIS — E871 Hypo-osmolality and hyponatremia: Secondary | ICD-10-CM | POA: Diagnosis not present

## 2019-07-29 DIAGNOSIS — I951 Orthostatic hypotension: Secondary | ICD-10-CM | POA: Diagnosis present

## 2019-07-29 DIAGNOSIS — K222 Esophageal obstruction: Secondary | ICD-10-CM | POA: Diagnosis not present

## 2019-07-29 DIAGNOSIS — E1122 Type 2 diabetes mellitus with diabetic chronic kidney disease: Secondary | ICD-10-CM | POA: Diagnosis present

## 2019-07-29 DIAGNOSIS — Z20822 Contact with and (suspected) exposure to covid-19: Secondary | ICD-10-CM | POA: Diagnosis not present

## 2019-07-29 DIAGNOSIS — E1151 Type 2 diabetes mellitus with diabetic peripheral angiopathy without gangrene: Secondary | ICD-10-CM | POA: Diagnosis present

## 2019-07-29 DIAGNOSIS — K5909 Other constipation: Secondary | ICD-10-CM | POA: Diagnosis present

## 2019-07-29 DIAGNOSIS — K580 Irritable bowel syndrome with diarrhea: Secondary | ICD-10-CM | POA: Diagnosis present

## 2019-07-29 DIAGNOSIS — I4581 Long QT syndrome: Secondary | ICD-10-CM | POA: Diagnosis present

## 2019-07-29 DIAGNOSIS — I252 Old myocardial infarction: Secondary | ICD-10-CM | POA: Diagnosis not present

## 2019-07-29 DIAGNOSIS — E860 Dehydration: Secondary | ICD-10-CM | POA: Diagnosis present

## 2019-07-29 DIAGNOSIS — K219 Gastro-esophageal reflux disease without esophagitis: Secondary | ICD-10-CM | POA: Diagnosis present

## 2019-07-29 DIAGNOSIS — E1165 Type 2 diabetes mellitus with hyperglycemia: Secondary | ICD-10-CM | POA: Diagnosis present

## 2019-07-29 DIAGNOSIS — I69354 Hemiplegia and hemiparesis following cerebral infarction affecting left non-dominant side: Secondary | ICD-10-CM | POA: Diagnosis not present

## 2019-07-29 LAB — BASIC METABOLIC PANEL
Anion gap: 8 (ref 5–15)
BUN: 24 mg/dL — ABNORMAL HIGH (ref 8–23)
CO2: 25 mmol/L (ref 22–32)
Calcium: 8.7 mg/dL — ABNORMAL LOW (ref 8.9–10.3)
Chloride: 103 mmol/L (ref 98–111)
Creatinine, Ser: 1.4 mg/dL — ABNORMAL HIGH (ref 0.44–1.00)
GFR calc Af Amer: 46 mL/min — ABNORMAL LOW (ref >60)
GFR calc non Af Amer: 40 mL/min — ABNORMAL LOW (ref >60)
Glucose, Bld: 172 mg/dL — ABNORMAL HIGH (ref 70–99)
Potassium: 3.6 mmol/L (ref 3.5–5.1)
Sodium: 136 mmol/L (ref 135–145)

## 2019-07-29 LAB — GLUCOSE, CAPILLARY
Glucose-Capillary: 167 mg/dL — ABNORMAL HIGH (ref 70–99)
Glucose-Capillary: 207 mg/dL — ABNORMAL HIGH (ref 70–99)
Glucose-Capillary: 158 mg/dL — ABNORMAL HIGH (ref 70–99)
Glucose-Capillary: 200 mg/dL — ABNORMAL HIGH (ref 70–99)

## 2019-07-29 LAB — CHLORIDE, URINE, RANDOM: Chloride Urine: 151 mmol/L

## 2019-07-29 LAB — SARS CORONAVIRUS 2 (TAT 6-24 HRS): SARS Coronavirus 2: NEGATIVE

## 2019-07-29 MED ORDER — LOPERAMIDE HCL 2 MG PO CAPS
2.0000 mg | ORAL_CAPSULE | ORAL | Status: DC | PRN
  Administered 2019-07-29 (×2): 2 mg via ORAL
  Filled 2019-07-29 (×2): qty 1

## 2019-07-29 MED ORDER — SUMATRIPTAN SUCCINATE 50 MG PO TABS
25.0000 mg | ORAL_TABLET | Freq: Two times a day (BID) | ORAL | Status: DC | PRN
  Administered 2019-07-29 – 2019-07-30 (×2): 25 mg via ORAL
  Filled 2019-07-29 (×2): qty 1

## 2019-07-29 MED ORDER — POTASSIUM CHLORIDE CRYS ER 20 MEQ PO TBCR
40.0000 meq | EXTENDED_RELEASE_TABLET | Freq: Once | ORAL | Status: AC
  Administered 2019-07-29: 10:00:00 40 meq via ORAL
  Filled 2019-07-29: qty 2

## 2019-07-29 NOTE — Plan of Care (Signed)

## 2019-07-29 NOTE — Progress Notes (Signed)
PROGRESS NOTE    [name]  [id number] DOB: 15-Mar-1956 DOA: 07/28/2019 PCP: [name], MD   Brief Narrative:  Per HPI:  [name] is a 64 y.o. female with medical history significant for coronary artery disease, critical limb ischemia status post femoropopliteal bypass, diabetes mellitus, hypertension, CVA.  Patient was sent to the ED from her primary care provider's office with reports of low blood pressure, patient's blood pressure systolic was in the [id number]. Patient reports dizziness that started today.  She she reports that she has been nauseous over the past 3 days with dry heaves without real vomiting and poor p.o. intake.  No abdominal pain.  She has not had a bowel movement in about 2 weeks.  She has chronic constipation and usually has 1 bowel movement per week.  No cough or difficulty breathing, no chest pain, no fever or chills, no pain with urination.  3/25: Patient was admitted with likely prerenal AKI in the setting of nausea and vomiting along with severe hypokalemia that has been a recurrent issue for her.  She was noted to also be hypotensive and her primary care physician's office.  Discussed case with nephrology who agrees with assessment of urine potassium, sodium, and chloride levels for now, but this would likely be nondiagnostic as well as she has received IV fluid and potassium supplementation.  Continue to monitor for stabilization of dietary intake and repeat a.m. labs.  Assessment & Plan:   Principal Problem:   AKI (acute kidney injury) ([place]) Active Problems:   DM2 (diabetes mellitus, type 2) ([place])   Essential hypertension   S/P femoral-popliteal bypass surgery   Diabetic gastroparesis associated with type 2 diabetes mellitus ([place])   AKI on CKD stage IIIa -Baseline creatinine 1-1.1 and is currently improving at 1.4 -Continue on IV fluid with some potassium supplementation and follow labs -Monitor strict I's and O's -Avoid nephrotoxic agents to  include home losartan  Severe hypokalemia/hyponatremia -Patient declines chlorthalidone use, will check with pharmacy -Continue IV fluid supplementation -Check urine potassium levels to rule out potassium wasting nephropathy -Likely need for outpatient nephrology follow-up once further stabilized and ready for discharge here  History of CVA -Residual left-sided weakness -Continue home aspirin, Plavix, and Crestor  History of CAD/PAD with prior femoral-popliteal bypass -Continue aspirin, Plavix, and Crestor  Type 2 diabetes -Recent hemoglobin A1c 9.1% -Continue SSI  Recent weight loss -TSH 0.651 -Follow-up with PCP  DVT prophylaxis: Heparin Code Status: Full Family Communication: Plan to call family Disposition Plan: Anticipate discharge by a.m. once further stabilized and improved from AKI and electrolyte disorder standpoint.  Checking urine studies.  IV fluid replenishment until tolerating diet.   Consultants:   Discussed with nephrology Dr. Carolin Sicks on phone  Procedures:   See below  Antimicrobials:   None   Subjective: Patient seen and evaluated today with no new acute complaints or concerns. No acute concerns or events noted overnight.  She has not been able to have much to eat and states that she is still having some trouble with intake.  Objective: Vitals:   07/28/19 1600 07/28/19 1708 07/28/19 1900 07/28/19 2020  BP: 104/71 92/65  114/81  Pulse:    85  Resp: 13 15  20   Temp:    97.8 F (36.6 C)  TempSrc:    Oral  SpO2:   100% 100%  Weight:      Height:        Intake/Output Summary (Last 24 hours) at 07/29/2019 1032  Last data filed at 07/29/2019 0400 Gross per 24 hour  Intake 1497.71 ml  Output --  Net 1497.71 ml   Filed Weights   07/28/19 1243  Weight: 60.1 kg    Examination:  General exam: Appears calm and comfortable  Respiratory system: Clear to auscultation. Respiratory effort normal. Cardiovascular system: S1 & S2 heard, RRR. No  JVD, murmurs, rubs, gallops or clicks. No pedal edema. Gastrointestinal system: Abdomen is nondistended, soft and nontender. No organomegaly or masses felt. Normal bowel sounds heard. Central nervous system: Alert and oriented. No focal neurological deficits. Extremities: Symmetric 5 x 5 power. Skin: No rashes, lesions or ulcers Psychiatry: Judgement and insight appear normal. Mood & affect appropriate.     Data Reviewed: I have personally reviewed following labs and imaging studies  CBC: Recent Labs  Lab 07/28/19 1430  WBC 11.0*  NEUTROABS 8.7*  HGB 13.4  HCT 42.4  MCV 86.2  PLT [id number]   Basic Metabolic Panel: Recent Labs  Lab 07/28/19 1430 07/28/19 2119 07/29/19 0608  NA 129*  --  136  K 2.2* 2.9* 3.6  CL 87*  --  103  CO2 29  --  25  GLUCOSE 260*  --  172*  BUN 28*  --  24*  CREATININE 1.84*  --  1.40*  CALCIUM 9.3  --  8.7*  MG 2.5*  --   --    GFR: Estimated Creatinine Clearance: 35.1 mL/min (A) (by C-G formula based on SCr of 1.4 mg/dL (H)). Liver Function Tests: Recent Labs  Lab 07/28/19 1430  AST 26  ALT 22  ALKPHOS 83  BILITOT 1.8*  PROT 7.4  ALBUMIN 4.0   No results for input(s): LIPASE, AMYLASE in the last 168 hours. No results for input(s): AMMONIA in the last 168 hours. Coagulation Profile: No results for input(s): INR, PROTIME in the last 168 hours. Cardiac Enzymes: No results for input(s): CKTOTAL, CKMB, CKMBINDEX, TROPONINI in the last 168 hours. BNP (last 3 results) No results for input(s): PROBNP in the last 8760 hours. HbA1C: No results for input(s): HGBA1C in the last 72 hours. CBG: Recent Labs  Lab 07/28/19 2016 07/29/19 0749  GLUCAP 191* 167*   Lipid Profile: No results for input(s): CHOL, HDL, LDLCALC, TRIG, CHOLHDL, LDLDIRECT in the last 72 hours. Thyroid Function Tests: Recent Labs    07/28/19 1430  TSH 0.651   Anemia Panel: No results for input(s): VITAMINB12, FOLATE, FERRITIN, TIBC, IRON, RETICCTPCT in the last 72  hours. Sepsis Labs: Recent Labs  Lab 07/28/19 1430 07/28/19 1639  LATICACIDVEN 1.9 2.1*    Recent Results (from the past 240 hour(s))  Blood culture (routine x 2)     Status: None (Preliminary result)   Collection Time: 07/28/19  2:30 PM   Specimen: Left Antecubital; Blood  Result Value Ref Range Status   Specimen Description LEFT ANTECUBITAL BOTTLES DRAWN AEROBIC ONLY  Final   Special Requests   Final    Blood Culture results may not be optimal due to an inadequate volume of blood received in culture bottles   Culture   Final    NO GROWTH < 24 HOURS Performed at [facility], [address], [place], [place] [address]    Report Status PENDING  Incomplete  Blood culture (routine x 2)     Status: None (Preliminary result)   Collection Time: 07/28/19  2:30 PM   Specimen: BLOOD LEFT HAND  Result Value Ref Range Status   Specimen Description BLOOD LEFT HAND BOTTLES DRAWN  AEROBIC ONLY  Final   Special Requests   Final    Blood Culture results may not be optimal due to an inadequate volume of blood received in culture bottles   Culture   Final    NO GROWTH < 24 HOURS Performed at [facility], [address], [place], [place] [address]    Report Status PENDING  Incomplete  SARS CORONAVIRUS 2 (TAT 6-24 HRS) Nasopharyngeal Nasopharyngeal Swab     Status: None   Collection Time: 07/28/19  4:40 PM   Specimen: Nasopharyngeal Swab  Result Value Ref Range Status   SARS Coronavirus 2 NEGATIVE NEGATIVE Final    Comment: (NOTE) SARS-CoV-2 target nucleic acids are NOT DETECTED. The SARS-CoV-2 RNA is generally detectable in upper and lower respiratory specimens during the acute phase of infection. Negative results do not preclude SARS-CoV-2 infection, do not rule out co-infections with other pathogens, and should not be used as the sole basis for treatment or other patient management decisions. Negative results must be combined with clinical observations, patient history, and  epidemiological information. The expected result is Negative. Fact Sheet for Patients: SugarRoll.be Fact Sheet for Healthcare Providers: https://www.woods-mathews.com/ This test is not yet approved or cleared by the [place] FDA and  has been authorized for detection and/or diagnosis of SARS-CoV-2 by FDA under an Emergency Use Authorization (EUA). This EUA will remain  in effect (meaning this test can be used) for the duration of the COVID-19 declaration under Section 56 4(b)(1) of the Act, 21 U.S.C. section 360bbb-3(b)(1), unless the authorization is terminated or revoked sooner. Performed at [facility], [place] [address], [place], [place] [address]          Radiology Studies: DG Chest 2 View  Result Date: 07/28/2019 CLINICAL DATA:  Hypotension. EXAM: CHEST - 2 VIEW COMPARISON:  CT scan June 20, 2019 FINDINGS: A rounded density over the periphery of the left lung base is consistent with known calcifications in the left breast. No suspicious nodules were seen in this region on the comparison CT scan. No pneumothorax. No infiltrates or masses. The cardiomediastinal silhouette is unremarkable. IMPRESSION: No active cardiopulmonary disease. Electronically Signed   By: Dorise Bullion III M.D   On: 07/28/2019 14:07        Scheduled Meds: . citalopram  10 mg Oral Daily  . clopidogrel  75 mg Oral Daily  . heparin  5,000 Units Subcutaneous Q8H  . insulin aspart  0-15 Units Subcutaneous TID WC  . insulin aspart  0-5 Units Subcutaneous QHS  . linaclotide  145 mcg Oral QAC breakfast  . pantoprazole  40 mg Oral Daily  . polyethylene glycol  17 g Oral BID  . rosuvastatin  40 mg Oral q morning - 10a  . traZODone  100 mg Oral QHS   Continuous Infusions: . 0.9 % NaCl with KCl 40 mEq / L 60 mL/hr (07/29/19 1028)     LOS: 0 days    Time spent: 35 minutes    Zeven Kocak Darleen Crocker, DO [facility]  If 7PM-7AM, please contact  night-coverage [email or web address] 07/29/2019, 10:32 AM

## 2019-07-30 LAB — BASIC METABOLIC PANEL
Anion gap: 9 (ref 5–15)
BUN: 17 mg/dL (ref 8–23)
CO2: 24 mmol/L (ref 22–32)
Calcium: 9.4 mg/dL (ref 8.9–10.3)
Chloride: 106 mmol/L (ref 98–111)
Creatinine, Ser: 1.18 mg/dL — ABNORMAL HIGH (ref 0.44–1.00)
GFR calc Af Amer: 56 mL/min — ABNORMAL LOW (ref >60)
GFR calc non Af Amer: 49 mL/min — ABNORMAL LOW (ref >60)
Glucose, Bld: 118 mg/dL — ABNORMAL HIGH (ref 70–99)
Potassium: 3.2 mmol/L — ABNORMAL LOW (ref 3.5–5.1)
Sodium: 139 mmol/L (ref 135–145)

## 2019-07-30 LAB — CBC
HCT: 34 % — ABNORMAL LOW (ref 36.0–46.0)
Hemoglobin: 11 g/dL — ABNORMAL LOW (ref 12.0–15.0)
MCH: 28.3 pg (ref 26.0–34.0)
MCHC: 32.4 g/dL (ref 30.0–36.0)
MCV: 87.4 fL (ref 80.0–100.0)
Platelets: 207 10*3/uL (ref 150–400)
RBC: 3.89 MIL/uL (ref 3.87–5.11)
RDW: 13 % (ref 11.5–15.5)
WBC: 4.6 10*3/uL (ref 4.0–10.5)
nRBC: 0 % (ref 0.0–0.2)

## 2019-07-30 LAB — MAGNESIUM: Magnesium: 2 mg/dL (ref 1.7–2.4)

## 2019-07-30 LAB — GLUCOSE, CAPILLARY: Glucose-Capillary: 100 mg/dL — ABNORMAL HIGH (ref 70–99)

## 2019-07-30 MED ORDER — ONDANSETRON HCL 4 MG PO TABS: 4.0000 mg | ORAL_TABLET | Freq: Every day | ORAL | 1 refills | Status: AC | PRN

## 2019-07-30 MED ORDER — POTASSIUM CHLORIDE ER 20 MEQ PO TBCR: 10.0000 meq | EXTENDED_RELEASE_TABLET | Freq: Two times a day (BID) | ORAL | 2 refills | Status: DC

## 2019-07-30 MED ORDER — LOPERAMIDE HCL 2 MG PO CAPS: 2.0000 mg | ORAL_CAPSULE | ORAL | 0 refills | Status: DC | PRN

## 2019-07-30 MED ORDER — POTASSIUM CHLORIDE CRYS ER 20 MEQ PO TBCR
40.0000 meq | EXTENDED_RELEASE_TABLET | Freq: Two times a day (BID) | ORAL | Status: DC
  Administered 2019-07-30: 40 meq via ORAL
  Filled 2019-07-30: qty 2

## 2019-07-30 NOTE — Discharge Summary (Signed)
Physician Discharge Summary  RAINELLE LEONELLI [id number] DOB: May 19, 1955 DOA: 07/28/2019  PCP: [name], MD  Admit date: 07/28/2019  Discharge date: 07/30/2019  Admitted From:Home  Disposition:  Home  Recommendations for Outpatient Follow-up:  1. Follow up with PCP in 1-2 weeks 2. Please obtain BMP in 1 week to reassess sodium and potassium levels as well as repeat creatinine 3. Recommend referral to outpatient nephrology for further evaluation should hypokalemia persist 4. Hold Linzess for now given diarrhea and use Imodium as needed which has been helping, follow-up with GI Dr. Carlean Purl in 2--3 weeks to reassess IBS symptoms and diarrhea 5. Continue on potassium 20 mEq twice daily for now  Home Health: None  Equipment/Devices: None  Discharge Condition: Stable  CODE STATUS: Full  Diet recommendation: Heart Healthy/carb modified  Brief/Interim Summary: Per HPI: Jennifer Kleindienst Blackwellis a 64 y.o.femalewith medical history significant forcoronary artery disease, critical limb ischemia status post femoropopliteal bypass, diabetes mellitus, hypertension, CVA. Patient was sent to the ED from her primary care provider's office with reports of low blood pressure, patient's blood pressure systolic was in the [id number]. Patient reports dizziness that started today. She she reports that she has been nauseous over the past 3 days with dry heaves without real vomiting and poor p.o. intake. No abdominal pain. She has not had a bowel movement in about 2 weeks. Shehas chronic constipation and usually has 1 bowel movement per week. No cough or difficulty breathing, no chest pain, no fever or chills, no pain with urination.  3/25: Patient was admitted with likely prerenal AKI in the setting of nausea and vomiting along with severe hypokalemia that has been a recurrent issue for her.  She was noted to also be hypotensive and her primary care physician's office.  Discussed case with nephrology who  agrees with assessment of urine potassium, sodium, and chloride levels for now, but this would likely be nondiagnostic as well as she has received IV fluid and potassium supplementation.  Continue to monitor for stabilization of dietary intake and repeat a.m. labs.  3/26: Patient is stable for discharge as her renal function has improved to 1.18 on creatinine study.  Potassium is still 3.2 and she will receive an additional 40 mEq of potassium prior to discharge.  It appears that much of her low potassium as well as prerenal AKI is secondary to ongoing diarrhea which she states has been going on for several weeks.  She does not appear to be taking potassium supplements as previously prescribed at home either.  She has been given Imodium overnight with improvement in her diarrhea symptoms and she has no further loose bowel movements and is tolerating diet.  She will be given Zofran as needed for nausea and vomiting and Imodium as needed for diarrhea which I attribute to her IBS.  I have recommended that she hold her Linzess for now until diarrhea resolves and after further follow-up with her GI physician.  She will need close follow-up with her PCP and consider nephrology in the outpatient setting should her potassium levels continue to remain low despite resolution of diarrhea and ongoing potassium supplementation.  She is currently stable for discharge as she is tolerating diet with no further loose bowel movements on Imodium.  Anticipate that her potassium levels will continue to improve with ongoing supplementation and close follow-up.  Her urine potassium and sodium studies are still pending, but I do not anticipate finding much here to explain her symptoms.  This can be followed  up outpatient.  Discharge Diagnoses:  Principal Problem:   AKI (acute kidney injury) ([place]) Active Problems:   DM2 (diabetes mellitus, type 2) ([place])   Essential hypertension   S/P femoral-popliteal bypass surgery   Diabetic  gastroparesis associated with type 2 diabetes mellitus ([place])  Principal discharge diagnosis: AKI on CKD stage IIIa-prerenal, secondary to ongoing diarrhea related to IBS.  Associated electrolyte disturbances of severe hypokalemia and hyponatremia.  Discharge Instructions  Discharge Instructions    Diet - low sodium heart healthy   Complete by: As directed    Increase activity slowly   Complete by: As directed      Allergies as of 07/30/2019      Reactions   Sulfa Antibiotics Shortness Of Breath, Palpitations   Codeine Other (See Comments)   Recovering Addict does not like to take Narcotics   Fish Allergy Hives, Swelling   Tongue swelling   Iodine Swelling   Metformin And Related Diarrhea   Soliqua [insulin Glargine-lixisenatide] Itching, Other (See Comments)   "tongue swelling"   Adhesive [tape] Rash   Paper tape is ok   Shellfish Allergy Swelling, Rash   Tongue swelling      Medication List    STOP taking these medications   cloNIDine 0.1 MG tablet Commonly known as: CATAPRES   linaclotide 145 MCG Caps capsule Commonly known as: LINZESS   losartan 50 MG tablet Commonly known as: COZAAR     TAKE these medications   acetaminophen 325 MG tablet Commonly known as: TYLENOL Take 2 tablets (650 mg total) by mouth every 6 (six) hours as needed for mild pain (or Fever >/= 101).   albuterol 108 (90 Base) MCG/ACT inhaler Commonly known as: ProAir HFA 1-2 inhalations every 4-6 hours as needed for cough or wheeze. What changed:   how much to take  how to take this  when to take this  reasons to take this  additional instructions   ALPRAZolam 0.5 MG tablet Commonly known as: XANAX Take 0.5 mg by mouth 2 (two) times daily as needed for anxiety.   aspirin EC 81 MG tablet Take 1 tablet (81 mg total) by mouth daily with breakfast.   Azelastine HCl 0.15 % Soln Place 2 sprays into both nostrils 2 (two) times daily. What changed:   when to take this  reasons to  take this   CeleXA 10 MG tablet Generic drug: citalopram Take 10 mg by mouth daily as needed.   clopidogrel 75 MG tablet Commonly known as: PLAVIX Take 1 tablet (75 mg total) by mouth daily.   EPINEPHrine 0.3 mg/0.3 mL Soaj injection Commonly known as: EPI-PEN Inject 0.3 mLs (0.3 mg total) into the muscle once. What changed:   when to take this  reasons to take this   HYDROcodone-acetaminophen 7.5-325 MG tablet Commonly known as: NORCO Take 1 tablet by mouth daily as needed for moderate pain.   insulin lispro 100 UNIT/ML injection Commonly known as: HUMALOG Inject 5-12 Units into the skin 2 (two) times daily before a meal. Per sliding scale using OMNIPOD   loperamide 2 MG capsule Commonly known as: IMODIUM Take 1 capsule (2 mg total) by mouth as needed for diarrhea or loose stools.   mupirocin ointment 2 % Commonly known as: BACTROBAN Apply 1 application topically 2 (two) times daily. What changed:   when to take this  reasons to take this   nitroGLYCERIN 0.4 MG SL tablet Commonly known as: NITROSTAT PLACE 1 TAB UNDER TONGUE EVERY 5 MINS AS  NEEDED FOR CHEST PAIN - MAX 3 DOSES THEN 911 What changed:   how much to take  how to take this  when to take this  reasons to take this  additional instructions   ondansetron 4 MG tablet Commonly known as: Zofran Take 1 tablet (4 mg total) by mouth daily as needed for nausea or vomiting.   pantoprazole 40 MG tablet Commonly known as: PROTONIX TAKE 1 TABLET BY MOUTH EVERY DAY   Potassium Chloride ER 20 MEQ Tbcr Take 10 mEq by mouth 2 (two) times daily.   propranolol ER 60 MG 24 hr capsule Commonly known as: INDERAL LA Take 1 capsule (60 mg total) by mouth every evening.   rosuvastatin 40 MG tablet Commonly known as: CRESTOR Take 1 tablet (40 mg total) by mouth every morning.   SUMAtriptan 100 MG tablet Commonly known as: IMITREX Take 100 mg by mouth every 2 (two) hours as needed for migraine.    temazepam 30 MG capsule Commonly known as: RESTORIL Take 30 mg by mouth at bedtime as needed for sleep.   traZODone 100 MG tablet Commonly known as: DESYREL Take 1 tablet (100 mg total) by mouth at bedtime.      Follow-up Information    [name], MD Follow up in 1 week(s).   Specialty: Internal Medicine Contact information: [facility] [address] [phone]        Gatha Mayer, MD Follow up in 2 week(s).   Specialty: Gastroenterology Contact information: [address] [address] [phone]          Allergies  Allergen Reactions  . Sulfa Antibiotics Shortness Of Breath and Palpitations  . 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 Fish Allergy Hives and Swelling    Tongue swelling  . Iodine Swelling  . Metformin And Related Diarrhea  . [name] [Insulin Glargine-Lixisenatide] Itching and Other (See Comments)    "tongue swelling"  . Adhesive [Tape] Rash    Paper tape is ok  . Shellfish Allergy Swelling and Rash    Tongue swelling    Consultations:  Spoke with nephrology on phone   Procedures/Studies: DG Chest 2 View  Result Date: 07/28/2019 CLINICAL DATA:  Hypotension. EXAM: CHEST - 2 VIEW COMPARISON:  CT scan June 20, 2019 FINDINGS: A rounded density over the periphery of the left lung base is consistent with known calcifications in the left breast. No suspicious nodules were seen in this region on the comparison CT scan. No pneumothorax. No infiltrates or masses. The cardiomediastinal silhouette is unremarkable. IMPRESSION: No active cardiopulmonary disease. Electronically Signed   By: Dorise Bullion III M.D   On: 07/28/2019 14:07     Discharge Exam: Vitals:   07/29/19 2215 07/30/19 0613  BP: 133/73 (!) 157/80  Pulse: 79 75  Resp: 16 16  Temp: 98.7 F (37.1 C) 98.3 F (36.8 C)  SpO2: 98% 96%   Vitals:   07/28/19 2020 07/29/19 1456 07/29/19 2215 07/30/19  0613  BP: 114/81 123/72 133/73 (!) 157/80  Pulse: 85 85 79 75  Resp: 20 16 16 16   Temp: 97.8 F (36.6 C) 98.3 F (36.8 C) 98.7 F (37.1 C) 98.3 F (36.8 C)  TempSrc: Oral Oral Oral   SpO2: 100% 98% 98% 96%  Weight:      Height:        General: Pt is alert, awake, not in acute distress Cardiovascular: RRR,  S1/S2 +, no rubs, no gallops Respiratory: CTA bilaterally, no wheezing, no rhonchi Abdominal: Soft, NT, ND, bowel sounds + Extremities: no edema, no cyanosis    The results of significant diagnostics from this hospitalization (including imaging, microbiology, ancillary and laboratory) are listed below for reference.     Microbiology: Recent Results (from the past 240 hour(s))  Blood culture (routine x 2)     Status: None (Preliminary result)   Collection Time: 07/28/19  2:30 PM   Specimen: Left Antecubital; Blood  Result Value Ref Range Status   Specimen Description LEFT ANTECUBITAL BOTTLES DRAWN AEROBIC ONLY  Final   Special Requests   Final    Blood Culture results may not be optimal due to an inadequate volume of blood received in culture bottles   Culture   Final    NO GROWTH 2 DAYS Performed at [facility], [address] [place], [place] [address]    Report Status PENDING  Incomplete  Blood culture (routine x 2)     Status: None (Preliminary result)   Collection Time: 07/28/19  2:30 PM   Specimen: BLOOD LEFT HAND  Result Value Ref Range Status   Specimen Description BLOOD LEFT HAND BOTTLES DRAWN AEROBIC ONLY  Final   Special Requests   Final    Blood Culture results may not be optimal due to an inadequate volume of blood received in culture bottles   Culture   Final    NO GROWTH 2 DAYS Performed at [facility], [address] [place], [place] [address]    Report Status PENDING  Incomplete  SARS CORONAVIRUS 2 (TAT 6-24 HRS) Nasopharyngeal Nasopharyngeal Swab     Status: None   Collection Time: 07/28/19  4:40 PM   Specimen: Nasopharyngeal Swab  Result  Value Ref Range Status   SARS Coronavirus 2 NEGATIVE NEGATIVE Final    Comment: (NOTE) SARS-CoV-2 target nucleic acids are NOT DETECTED. The SARS-CoV-2 RNA is generally detectable in upper and lower respiratory specimens during the acute phase of infection. Negative results do not preclude SARS-CoV-2 infection, do not rule out co-infections with other pathogens, and should not be used as the sole basis for treatment or other patient management decisions. Negative results must be combined with clinical observations, patient history, and epidemiological information. The expected result is Negative. Fact Sheet for Patients: SugarRoll.be Fact Sheet for Healthcare Providers: https://www.woods-mathews.com/ This test is not yet approved or cleared by the [place] FDA and  has been authorized for detection and/or diagnosis of SARS-CoV-2 by FDA under an Emergency Use Authorization (EUA). This EUA will remain  in effect (meaning this test can be used) for the duration of the COVID-19 declaration under Section 56 4(b)(1) of the Act, 21 U.S.C. section 360bbb-3(b)(1), unless the authorization is terminated or revoked sooner. Performed at [facility], [place] [address], [place], [place] [address]      Labs: BNP (last 3 results) No results for input(s): BNP in the last 8760 hours. Basic Metabolic Panel: Recent Labs  Lab 07/28/19 1430 07/28/19 2119 07/29/19 0608 07/30/19 0611  NA 129*  --  136 139  K 2.2* 2.9* 3.6 3.2*  CL 87*  --  103 106  CO2 29  --  25 24  GLUCOSE 260*  --  172* 118*  BUN 28*  --  24* 17  CREATININE 1.84*  --  1.40* 1.18*  CALCIUM 9.3  --  8.7* 9.4  MG 2.5*  --   --  2.0   Liver  Function Tests: Recent Labs  Lab 07/28/19 1430  AST 26  ALT 22  ALKPHOS 83  BILITOT 1.8*  PROT 7.4  ALBUMIN 4.0   No results for input(s): LIPASE, AMYLASE in the last 168 hours. No results for input(s): AMMONIA in the last 168  hours. CBC: Recent Labs  Lab 07/28/19 1430 07/30/19 0611  WBC 11.0* 4.6  NEUTROABS 8.7*  --   HGB 13.4 11.0*  HCT 42.4 34.0*  MCV 86.2 87.4  PLT 268 207   Cardiac Enzymes: No results for input(s): CKTOTAL, CKMB, CKMBINDEX, TROPONINI in the last 168 hours. BNP: Invalid input(s): POCBNP CBG: Recent Labs  Lab 07/29/19 0749 07/29/19 1130 07/29/19 1612 07/29/19 2213 07/30/19 0732  GLUCAP 167* 207* 158* 200* 100*   D-Dimer No results for input(s): DDIMER in the last 72 hours. Hgb A1c No results for input(s): HGBA1C in the last 72 hours. Lipid Profile No results for input(s): CHOL, HDL, LDLCALC, TRIG, CHOLHDL, LDLDIRECT in the last 72 hours. Thyroid function studies Recent Labs    07/28/19 1430  TSH 0.651   Anemia work up No results for input(s): VITAMINB12, FOLATE, FERRITIN, TIBC, IRON, RETICCTPCT in the last 72 hours. Urinalysis    Component Value Date/Time   COLORURINE YELLOW 06/20/2019 1657   APPEARANCEUR HAZY (A) 06/20/2019 1657   LABSPEC 1.018 06/20/2019 1657   PHURINE 6.0 06/20/2019 1657   GLUCOSEU >=500 (A) 06/20/2019 1657   HGBUR SMALL (A) 06/20/2019 1657   BILIRUBINUR NEGATIVE 06/20/2019 1657   KETONESUR NEGATIVE 06/20/2019 1657   PROTEINUR 100 (A) 06/20/2019 1657   UROBILINOGEN 1.0 12/05/2009 1908   NITRITE NEGATIVE 06/20/2019 1657   LEUKOCYTESUR NEGATIVE 06/20/2019 1657   Sepsis Labs Invalid input(s): PROCALCITONIN,  WBC,  LACTICIDVEN Microbiology Recent Results (from the past 240 hour(s))  Blood culture (routine x 2)     Status: None (Preliminary result)   Collection Time: 07/28/19  2:30 PM   Specimen: Left Antecubital; Blood  Result Value Ref Range Status   Specimen Description LEFT ANTECUBITAL BOTTLES DRAWN AEROBIC ONLY  Final   Special Requests   Final    Blood Culture results may not be optimal due to an inadequate volume of blood received in culture bottles   Culture   Final    NO GROWTH 2 DAYS Performed at [facility], [address], [place], [place] [address]    Report Status PENDING  Incomplete  Blood culture (routine x 2)     Status: None (Preliminary result)   Collection Time: 07/28/19  2:30 PM   Specimen: BLOOD LEFT HAND  Result Value Ref Range Status   Specimen Description BLOOD LEFT HAND BOTTLES DRAWN AEROBIC ONLY  Final   Special Requests   Final    Blood Culture results may not be optimal due to an inadequate volume of blood received in culture bottles   Culture   Final    NO GROWTH 2 DAYS Performed at [facility], [address] [place], [place] [address]    Report Status PENDING  Incomplete  SARS CORONAVIRUS 2 (TAT 6-24 HRS) Nasopharyngeal Nasopharyngeal Swab     Status: None   Collection Time: 07/28/19  4:40 PM   Specimen: Nasopharyngeal Swab  Result Value Ref Range Status   SARS Coronavirus 2 NEGATIVE NEGATIVE Final    Comment: (NOTE) SARS-CoV-2 target nucleic acids are NOT DETECTED. The SARS-CoV-2 RNA is generally detectable in upper and lower respiratory specimens during the acute phase of infection. Negative results do not preclude SARS-CoV-2 infection, do  not rule out co-infections with other pathogens, and should not be used as the sole basis for treatment or other patient management decisions. Negative results must be combined with clinical observations, patient history, and epidemiological information. The expected result is Negative. Fact Sheet for Patients: SugarRoll.be Fact Sheet for Healthcare Providers: https://www.woods-mathews.com/ This test is not yet approved or cleared by the [place] FDA and  has been authorized for detection and/or diagnosis of SARS-CoV-2 by FDA under an Emergency Use Authorization (EUA). This EUA will remain  in effect (meaning this test can be used) for the duration of the COVID-19 declaration under Section 56 4(b)(1) of the Act, 21 U.S.C. section 360bbb-3(b)(1), unless the authorization is terminated or revoked  sooner. Performed at [facility], [place] [address] [place], [place] [address]      Time coordinating discharge: 35 minutes  SIGNED:   Rodena Goldmann, DO [facility] 07/30/2019, 9:56 AM  If 7PM-7AM, please contact night-coverage [email or web address]

## 2019-07-30 NOTE — Care Management Important Message (Signed)
Important Message  Patient Details  Name: [name] MRN: [id number] Date of Birth: February 02, 1956   Medicare Important Message Given:  Yes(given by Mardene Celeste, RN due to precautions)     Tommy Medal 07/30/2019, 1:19 PM

## 2019-08-01 LAB — GI PATHOGEN PANEL BY PCR, STOOL

## 2019-08-02 ENCOUNTER — Other Ambulatory Visit: Payer: Self-pay

## 2019-08-02 DIAGNOSIS — N1831 Chronic kidney disease, stage 3a: Secondary | ICD-10-CM

## 2019-08-02 LAB — CULTURE, BLOOD (ROUTINE X 2)
Culture: NO GROWTH
Culture: NO GROWTH

## 2019-08-05 ENCOUNTER — Other Ambulatory Visit: Payer: Self-pay | Admitting: *Deleted

## 2019-08-05 ENCOUNTER — Encounter: Payer: Self-pay | Admitting: *Deleted

## 2019-08-05 NOTE — Patient Outreach (Signed)
[name]) Care Management  08/05/2019  [name] 1955/09/10 [id number]   [name] Telephone Assessment/Screen [name] liaison referral  Referral Date: 08/02/19 Referral Source: [name], [name] [name] liaison Referral Reason: Post [name] follow up Diagnoses of Diabetes, CKD Please refer to community telephonic RN for care management services.  Insurance: united healthcare medicare   Last admission 07/28/19 to 07/30/19  Outreach attempt #1 Patient is able to verify HIPAA, DOB and address Reviewed and addressed referral to [name] with patient  [name] reports she is doing fair She reports various calls since her discharge home  She reports receiving a transition of care call on 08/04/19 prior to [name] RN CM's outreach She is unable to inform [name] RN CM whom the call was from but was able to stated the transition of care questions asked of her Transition of care questions reviewed   [name] reports constipation today and providing her self an enema. She reports a history of gastrointestinal (GI) concerns/bowel elimination.  She takes stool softeners. She reports losing 20 lbs in the last few months related to her GI issues.   She does not have a nephrologist but is awaiting assistance from her NP/PA at her primary care provider (PCP) office for recommendations. [name] RN CM reviewed with her some names of the listed in network nephrologists per united healthcare's online provider site. She reports preferring medical providers in [place]   Social: [name] is a 64 year old disabled divorced female who lives alone She reports having minimal support system. She does have a daughter and grand daughter. She has a niece that will be graduating from medical school (pediatric oncology) she consults with.  She has a church member to assist with food. 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   She reports being independent with her care needs She reports having home health and other care services but does not prefer any at this time. She reports being able to complete the exercises taught to her by her previous physical therapist. 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 Patient gave verbal consent for services [name] telephonic RN CM.   Plan: [name] RN CM will follow up with [name] within the next 7-10 business days .Pt encouraged to return a call to [facility] RN CM prn Routed note to MD  [name] CM Care Plan Problem One     Most Recent Value  Care Plan Problem One  Risk for [name] readmission secondary to recently diagnosed CKD  Role Documenting the Problem One  Care Management Telephonic Coordinator  Care Plan for Problem One  Active  [name] Long Term Goal   over the next 90 days patient will be able to  verbalize home care action plans for major medical conditions (CKD, DM, HTN) during futrue outreach  [name] Term Goal Start Date  08/05/19  Interventions for Problem One Long Term Goal  inital assessement,  encouragement provided, Discussed constipation managment  [name] CM Short Term Goal #1   Over the next 31 days, patient will not experience [name] readmission, as evidence by patient reporting and review of EMR during [facility] RN CCM outreach  [name] CM Short Term Goal #1 Start Date  08/05/19  Interventions for Short Term Goal #1  inital assessement, encouragement provided, Discussed constipation managment, encouraged MD follow up      [place]. Lavina Hamman, RN, BSN, [place] Coordinator Office number [phone] Mobile number [phone]  Main [name] number [phone] Fax number [phone]

## 2019-08-11 ENCOUNTER — Ambulatory Visit: Payer: Medicare Other | Admitting: Cardiovascular Disease

## 2019-08-12 ENCOUNTER — Other Ambulatory Visit: Payer: Self-pay | Admitting: *Deleted

## 2019-08-12 ENCOUNTER — Encounter: Payer: Self-pay | Admitting: *Deleted

## 2019-08-12 NOTE — Patient Outreach (Addendum)
[facility]) Care Management  08/12/2019  [name] Feb 16, 1956 [id number]   [facility] Telephone Assessment/Screen [facility] hospital liaison referral  Referral Date: 08/02/19 Referral Source: [name], [facility] hospital liaison Referral Reason: Post hospital follow up Diagnoses of Diabetes, CKD Please refer to community telephonic RN for care management services.  Insurance: united healthcare medicare   Last admission 07/28/19 to 07/30/19  Outreach attempt #2 successful  Update she continues to have GI symptoms She reports she is doing fair but is scheduled for follow up on 08/13/19 with [name] at her primary care provider (PCP) office.She is completing home management interventions and remaining active, hydrated, attempting to increase her appetite and taking all prescribed medications appropriately. She reports having a small stool on today after a laxative. Her weight is reported by her at 124 lbs. She states she still remains weak and was only able to get in half a chicken sandwich in 12-16 hours, She reports nausea and pain on her right side .    Her [employer] house call nurse visited today She has had a home health nurse visit  She has a history of smoking and reports "getting depressed and restarting smoking. She reports relying on her faith and she has restarted writing in her journal and use to meditate.She went to church on Easter Sunday.  She is walking in the house and in stores for exercise and to get out. She was offered [facility] SW services but not preferred at this time. She was given time to ventilate her feelings She concluded call when she had to go assist someone with notarization   EMMI materials sent to the listed e-mail in Epic to include smoking cessation thinking but quitting smoking, IBS diet, colon polyps,hypertension high blood pressure, gastroparesis delayed gastric emptying,   Social: [name] is a 64 year old disabled divorced  female who lives alone She reports having minimal support system. She does have a daughter and grand daughter. She has a niece that will be graduating from medical school (pediatric oncology) she consults with.  She has a church member to assist with food. She grew up in [place], finished high school and completed Statistician training She started [employer] training but did not finished due to medical issues.She is a Patent examiner. She still has a good relationship with her ex husband and ex mother in law.  She reports being independent with her care needs She reports having home health and other care services but does not prefer any at this time. She reports being able to complete the exercises taught to her by her previous physical therapist. 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")  DME: cane, Shower seat, hand held shower head   Plans [facility] RN CM will follow up with [name] within the next 7-10 business days for further assessment, care coordination and disease management Pt encouraged to return a call to [facility] RN CM prn Routed note to MD  [facility] CM Care Plan Problem One     Most Recent Value  Care Plan Problem One  Risk for hospital readmission secondary to recently diagnosed CKD  Role Documenting the Problem  One  Care Management Telephonic Coordinator  Care Plan for Problem One  Active  [name] Long Term Goal   over the next 90 days patient will be able to verbalize home care action plans for major medical conditions (CKD, DM, HTN)  during futrue outreach  [place] Term Goal Start Date  08/05/19  Interventions for Problem One Long Term Goal  EMMI materials sent to the listed e-mail in Epic to include smoking cessation thinking but quitting smoking, IBS diet, colon polyps,hypertension high blood pressure, gastroparesis delayed gastric emptying,   [name] CM Short Term Goal #1   Over the next 31 days, patient will not experience hospital readmission, as evidence by patient reporting and review of EMR during [facility] RN CCM outreach  [facility] CM Short Term Goal #1 Start Date  08/05/19  Interventions for Short Term Goal #1  assessed for worsening symptoms, any improvements since last outreach, provided encouragement and allowed her time to ventilate her feelings      [name] L. Lavina Hamman, RN, BSN, [place] Coordinator Office number [phone] Mobile number [phone]  Main [name] number [phone] Fax number [phone]

## 2019-08-13 ENCOUNTER — Other Ambulatory Visit: Payer: Self-pay

## 2019-08-13 DIAGNOSIS — E1140 Type 2 diabetes mellitus with diabetic neuropathy, unspecified: Secondary | ICD-10-CM | POA: Diagnosis not present

## 2019-08-13 DIAGNOSIS — I10 Essential (primary) hypertension: Secondary | ICD-10-CM | POA: Diagnosis not present

## 2019-08-13 DIAGNOSIS — E1121 Type 2 diabetes mellitus with diabetic nephropathy: Secondary | ICD-10-CM | POA: Diagnosis not present

## 2019-08-13 DIAGNOSIS — E1165 Type 2 diabetes mellitus with hyperglycemia: Secondary | ICD-10-CM | POA: Diagnosis not present

## 2019-08-13 DIAGNOSIS — E1151 Type 2 diabetes mellitus with diabetic peripheral angiopathy without gangrene: Secondary | ICD-10-CM | POA: Diagnosis not present

## 2019-08-13 DIAGNOSIS — N390 Urinary tract infection, site not specified: Secondary | ICD-10-CM | POA: Diagnosis not present

## 2019-08-13 DIAGNOSIS — E876 Hypokalemia: Secondary | ICD-10-CM | POA: Diagnosis not present

## 2019-08-13 DIAGNOSIS — I959 Hypotension, unspecified: Secondary | ICD-10-CM | POA: Diagnosis not present

## 2019-08-13 DIAGNOSIS — E1149 Type 2 diabetes mellitus with other diabetic neurological complication: Secondary | ICD-10-CM | POA: Diagnosis not present

## 2019-08-13 DIAGNOSIS — E1122 Type 2 diabetes mellitus with diabetic chronic kidney disease: Secondary | ICD-10-CM | POA: Diagnosis not present

## 2019-08-14 ENCOUNTER — Telehealth: Payer: Self-pay | Admitting: Gastroenterology

## 2019-08-14 NOTE — Telephone Encounter (Signed)
Patient called after hours. Multiple complaints of nausea / vomiting, abdominal pain. Recently admitted for bleeding at [facility]. Asking to be seen this weekend, told her we do not keep office hours over the weekend, only way if she is feeling that poorly would be to go to the ED or urgent care. She is hoping Dr. Carlean Purl can see her this week, I told her I would relay the message and try to call her Monday. In the interim if she is not doing well her only option would be ED or urgent care over the weekend. She agreed.   Barbera Setters can you call this patient Monday AM for Dr Carlean Purl? Thanks

## 2019-08-16 ENCOUNTER — Telehealth: Payer: Self-pay | Admitting: Cardiovascular Disease

## 2019-08-16 NOTE — Telephone Encounter (Signed)
Left message for patient to call back  

## 2019-08-16 NOTE — Telephone Encounter (Signed)
Cia is calling requesting a nurse give her a call in regards to some changes made by her PCP. Please advise.

## 2019-08-16 NOTE — Telephone Encounter (Signed)
Returned call to patient of Dr. Gwenlyn Found  PCP stopped losartan, ASA & plavix - she also has had internal bleeding and is seeing Dr. Carlean Purl She would like to see cardiologist soon to discuss She has been scheduled to see K. Lawrence DNP on 4/14 @ 2:15pm Asked that she bring updated list of meds and have MDs that she has seen recently fax notes to our office

## 2019-08-17 NOTE — Progress Notes (Signed)
Cardiology Office Note   Date:  08/17/2019   ID:  [name], DOB 1956-02-06, MRN [id number]  PCP:  [name], MD  Cardiologist:  Dr.Berry No chief complaint on file.    History of Present Illness: [name] is a 64 y.o. female who presents for ongoing assessment and management of hypertension, PAD with occluded left SFA stent, s/p left fem-pop on 06/09/2016 by Dr. Trula Slade. Most recent cardiac cath 12/26/2016 revealing minimal non-obstructive CAD. She continues to smoke. She also has hyperlipidemia on statin therapy..  Our office was informed by phone on 08/16/2019 that PCP stopped ASA and Plavix due to internal bleeding. She is being seen by Dr. Carlean Purl for this. She requests office visit to discuss this.   She was seen by Lucie Leather NP on 08/13/2019, through Dr. Edwyna Ready calls office in [place].  At that time clonidine was listed as being taken but she stated she was not.  She was also taken off of losartan, and Plavix along with aspirin due to internal bleeding reported by the patient.  She comes today very anxious (which is a usual state for her) hypertensive, confused about her medications, complaining of headaches, tingling in her hands with cramping.  She has multiple somatic complaints.  Past Medical History:  Diagnosis Date  . 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 Anemia    hx  . Angioedema 07/10/2015  . Anxiety   . Arthritis    "neck, left hand" (09/14/2012)  . Asthma   . Cancer ([place]) 1985   ovarian, no treatment except surgery  . Complication of anesthesia    OCCASIONAL TROUBLE TURNING NECK TO RIGHT  . Critical lower limb ischemia    10/2014 s/p L SFA stenting  . Diverticulosis of colon with hemorrhage April 2013  . GERD (gastroesophageal reflux disease)   . H/O hiatal hernia   . Heart attack [facility])    2003 mild MI, March 2013 mild MI  . Hidradenitis    groin  . History of blood transfusion 1985 AND 2013  . Hyperlipidemia   . Hypertension   . Irritable bowel  syndrome   . Left-sided weakness    since stroke, left eye trouble seeing  . Migraines   . Mild CAD    a. Cath 09/2010: mild luminal irregularities of LAD, 30% prox RCA and 20-30% mRCA, EF 65%.  . Neuropathy   . Obesity   . PAD (peripheral artery disease) ([place])    a. critical limb ischemia s/p PTA/stenting of L SFA 10/2014. c. occ prior SFA stent by angio 01/2016, for possible PV bypass.  . Pneumonia    baby  . Recurrent upper respiratory infection (URI)   . S/P arterial stent-mid Lt SFA 11/03/14 11/04/2014  . Schatzki's ring   . Sinus problem   . Stomach ulcer 1972   non-bleeding  . Stroke ([place]) 07-2007, 07-2008, 07-2009   total 3 strokes; mild left sided weakness and left eye "jumps".  . Type II diabetes mellitus ([place])   . Vitamin B 12 deficiency 07-15-2013    Past Surgical History:  Procedure Laterality Date  . ABDOMINAL HYSTERECTOMY  1985  . ADENOIDECTOMY    . ANTERIOR CERVICAL DECOMP/DISCECTOMY FUSION  2002  . ANTERIOR CERVICAL DECOMP/DISCECTOMY FUSION N/A 04/08/2014   Procedure: Cervical Six-Seven ANTERIOR CERVICAL DECOMPRESSION/DISCECTOMY FUSION Plating and Bonegraft  2 LEVELS;  Surgeon: Ashok Pall, MD;  Location: [place] [facility] ORS;  Service: Neurosurgery;  Laterality: N/A;  Cervical Six-Seven ANTERIOR CERVICAL DECOMPRESSION/DISCECTOMY FUSION Plating and  Bonegraft  2 LEVELS  . APPENDECTOMY  1985  . AXILLARY HIDRADENITIS EXCISION  1990-2008   bilateral  . BACK SURGERY    . BREAST BIOPSY Right 2007  . BREAST CYST EXCISION Right 2008  . BREAST REDUCTION SURGERY    . CARDIAC CATHETERIZATION  2004   mild disease  . CATARACT EXTRACTION W/PHACO Left 05/16/2015   Procedure: CATARACT EXTRACTION PHACO AND INTRAOCULAR LENS PLACEMENT (IOC);  Surgeon: Rutherford Guys, MD;  Location: [facility];  Service: Ophthalmology;  Laterality: Left;  CDE: 4.24  . CATARACT EXTRACTION W/PHACO Right 05/30/2015   Procedure: CATARACT EXTRACTION RIGHT EYE PHACO AND INTRAOCULAR LENS PLACEMENT ;  Surgeon: Rutherford Guys,  MD;  Location: [facility];  Service: Ophthalmology;  Laterality: Right;  CDE:4.08  . CHOLECYSTECTOMY  1990's  . COLONOSCOPY  08/12/2011   Procedure: COLONOSCOPY;  Surgeon: Ladene Artist, MD,FACG;  Location: [facility] ENDOSCOPY;  Service: Endoscopy;  Laterality: N/A;  . COLONOSCOPY  06/19/2006  . COLONOSCOPY WITH PROPOFOL N/A 07/02/2019   Procedure: COLONOSCOPY WITH PROPOFOL;  Surgeon: Rogene Houston, MD;  [facility];  Service: Endoscopy;  Laterality: N/A;  . cyst thigh Right   . ESOPHAGOGASTRODUODENOSCOPY  08/12/2011   Procedure: ESOPHAGOGASTRODUODENOSCOPY (EGD);  Surgeon: Ladene Artist, MD,FACG;  Location: [facility] ENDOSCOPY;  Service: Endoscopy;  Laterality: N/A;  . ESOPHAGOGASTRODUODENOSCOPY  06/04/2005  . ESOPHAGOGASTRODUODENOSCOPY (EGD) WITH PROPOFOL N/A 07/01/2019   Procedure: ESOPHAGOGASTRODUODENOSCOPY (EGD) WITH PROPOFOL;  Surgeon: Rogene Houston, MD;  [facility];  Service: Endoscopy;  Laterality: N/A;  . FEMORAL-POPLITEAL BYPASS GRAFT Left 06/19/2016   Procedure: Left Leg BYPASS GRAFT FEMORAL-POPLITEAL ARTERY;  Surgeon: Serafina Mitchell, MD;  Location: [facility];  Service: Vascular;  Laterality: Left;  . GIVENS CAPSULE STUDY  08/13/2011   Procedure: GIVENS CAPSULE STUDY;  Surgeon: Ladene Artist, MD,FACG;  Location: [facility] ENDOSCOPY;  Service: Endoscopy;  Laterality: N/A;  . HAMMER TOE SURGERY Bilateral ~ 2000  . HEMOSTASIS CLIP PLACEMENT  07/02/2019   Procedure: HEMOSTASIS CLIP PLACEMENT;  Surgeon: Rogene Houston, MD;  [facility];  Service: Endoscopy;;  hepatic flexure  . HIATAL HERNIA REPAIR    . HYDRADENITIS EXCISION  01/2011; 03/2012   'groin and abdomen; 03/2012" (09/14/2012)  . HYDRADENITIS EXCISION  04/01/2012   Procedure: EXCISION HYDRADENITIS GROIN;  Surgeon: Pedro Earls, MD;  Location: [facility] ORS;  Service: General;  Laterality: Bilateral;  Excision of Hydradenitis of Perineum  . HYDRADENITIS EXCISION N/A 09/17/2013   Procedure: EXCISION PERINEAL HIDRADENITIS ;   Surgeon: Pedro Earls, MD;  Location: [facility] ORS;  Service: General;  Laterality: N/A;  also in the pubis area  . LEFT HEART CATH AND CORONARY ANGIOGRAPHY N/A 12/26/2016   Procedure: LEFT HEART CATH AND CORONARY ANGIOGRAPHY;  Surgeon: [name], Peter M, MD;  Location: [facility] CV LAB;  Service: Cardiovascular;  Laterality: N/A;  . MASS EXCISION Right 09/17/2013   Procedure: EXCISION MASS;  Surgeon: Pedro Earls, MD;  Location: [facility] ORS;  Service: General;  Laterality: Right;  . NISSEN FUNDOPLICATION  99991111  . PERIPHERAL VASCULAR CATHETERIZATION N/A 11/03/2014   Procedure: Lower Extremity Angiography;  Surgeon: Lorretta Harp, MD;  Location: [place] CV LAB;  Service: Cardiovascular;  Laterality: N/A;  . PERIPHERAL VASCULAR CATHETERIZATION N/A 01/29/2016   Procedure: Lower Extremity Angiography;  Surgeon: Lorretta Harp, MD;  Location: [place] CV LAB;  Service: Cardiovascular;  Laterality: N/A;  . POLYPECTOMY  07/02/2019   Procedure: POLYPECTOMY;  Surgeon: Rogene Houston, MD;  [facility];  Service: Endoscopy;;  . POSTERIOR LUMBAR FUSION  2008 X 2  . REDUCTION MAMMAPLASTY  1996?  . TONSILLECTOMY AND ADENOIDECTOMY  1959 AND 2000  . UVULOPALATOPHARYNGOPLASTY, TONSILLECTOMY AND SEPTOPLASTY  2000's     Current Outpatient Medications  Medication Sig Dispense Refill  . acetaminophen (TYLENOL) 325 MG tablet Take 2 tablets (650 mg total) by mouth every 6 (six) hours as needed for mild pain (or Fever >/= 101). 12 tablet 2  . albuterol (PROAIR HFA) 108 (90 Base) MCG/ACT inhaler 1-2 inhalations every 4-6 hours as needed for cough or wheeze. (Patient taking differently: Inhale 1-2 puffs into the lungs every 4 (four) hours as needed for shortness of breath. ) 1 Inhaler 1  . ALPRAZolam (XANAX) 0.5 MG tablet Take 0.5 mg by mouth 2 (two) times daily as needed for anxiety.    Marland Kitchen aspirin EC 81 MG tablet Take 1 tablet (81 mg total) by mouth daily with breakfast. (Patient not taking: Reported  on 07/28/2019) 30 tablet 2  . Azelastine HCl 0.15 % SOLN Place 2 sprays into both nostrils 2 (two) times daily. (Patient taking differently: Place 2 sprays into both nostrils 2 (two) times daily as needed (allergies). ) 30 mL 5  . citalopram (CELEXA) 10 MG tablet Take 10 mg by mouth daily as needed.     . clopidogrel (PLAVIX) 75 MG tablet Take 1 tablet (75 mg total) by mouth daily. 30 tablet 2  . EPINEPHrine 0.3 mg/0.3 mL IJ SOAJ injection Inject 0.3 mLs (0.3 mg total) into the muscle once. (Patient taking differently: Inject 0.3 mg into the muscle as needed for anaphylaxis. ) 1 Device 1  . HYDROcodone-acetaminophen (NORCO) 7.5-325 MG tablet Take 1 tablet by mouth daily as needed for moderate pain.     Marland Kitchen insulin lispro (HUMALOG) 100 UNIT/ML injection Inject 5-12 Units into the skin 2 (two) times daily before a meal. Per sliding scale using OMNIPOD    . loperamide (IMODIUM) 2 MG capsule Take 1 capsule (2 mg total) by mouth as needed for diarrhea or loose stools. 30 capsule 0  . mupirocin ointment (BACTROBAN) 2 % Apply 1 application topically 2 (two) times daily. (Patient taking differently: Apply 1 application topically 2 (two) times daily as needed. ) 30 g 1  . nitroGLYCERIN (NITROSTAT) 0.4 MG SL tablet PLACE 1 TAB UNDER TONGUE EVERY 5 MINS AS NEEDED FOR CHEST PAIN - MAX 3 DOSES THEN 911 (Patient taking differently: Place 0.4 mg under the tongue every 5 (five) minutes as needed for chest pain. MAX 3 DOSES THEN 911) 25 tablet 2  . ondansetron (ZOFRAN) 4 MG tablet Take 1 tablet (4 mg total) by mouth daily as needed for nausea or vomiting. 30 tablet 1  . pantoprazole (PROTONIX) 40 MG tablet TAKE 1 TABLET BY MOUTH EVERY DAY (Patient taking differently: Take 40 mg by mouth daily. ) 30 tablet 11  . potassium chloride 20 MEQ TBCR Take 10 mEq by mouth 2 (two) times daily. 60 tablet 2  . propranolol ER (INDERAL LA) 60 MG 24 hr capsule Take 1 capsule (60 mg total) by mouth every evening. 90 capsule 3  .  rosuvastatin (CRESTOR) 40 MG tablet Take 1 tablet (40 mg total) by mouth every morning. 90 tablet 3  . SUMAtriptan (IMITREX) 100 MG tablet Take 100 mg by mouth every 2 (two) hours as needed for migraine.     . temazepam (RESTORIL) 30 MG capsule Take 30 mg by mouth at bedtime as needed  for sleep.    . traZODone (DESYREL) 100 MG tablet Take 1 tablet (100 mg total) by mouth at bedtime. 30 tablet 0   No current facility-administered medications for this visit.    Allergies:   Sulfa antibiotics, Codeine, Fish allergy, Iodine, Metformin and related, Soliqua [insulin glargine-lixisenatide], Adhesive [tape], and Shellfish allergy    Social History:  The patient  reports that she quit smoking about 3 years ago. Her smoking use included cigarettes. She has a 20.50 pack-year smoking history. She has never used smokeless tobacco. She reports that she does not drink alcohol or use drugs.   Family History:  The patient's family history includes Allergic rhinitis in her sister; Aneurysm in her sister; Breast cancer in her mother; Diabetes in her father and sister; Emphysema in an other family member; Heart disease in her father, mother, and sister; Hyperlipidemia in her sister; Hypertension in her mother, sister, and sister; Stroke in her father.    ROS: All other systems are reviewed and negative. Unless otherwise mentioned in H&P    PHYSICAL EXAM: VS:  There were no vitals taken for this visit. , BMI There is no height or weight on file to calculate BMI. GEN: Well nourished, well developed, in no acute distress HEENT: normal Neck: no JVD, carotid bruits, or masses Cardiac:RRR, tachycardia; S4 murmurs, rubs, or gallops,no edema  Respiratory:  Clear to auscultation bilaterally, normal work of breathing GI: soft, nontender, nondistended, + BS MS: no deformity or atrophy Skin: warm and dry, no rash Neuro:  Strength and sensation are intact Psych: euthymic mood, full affect   EKG: Sinus tachycardia  heart rate of 105 bpm evidence of septal infarct (personally reviewed}  Recent Labs: 07/28/2019: ALT 22; TSH 0.651 07/30/2019: BUN 17; Creatinine, Ser 1.18; Hemoglobin 11.0; Magnesium 2.0; Platelets 207; Potassium 3.2; Sodium 139    Lipid Panel    Component Value Date/Time   CHOL 103 07/06/2011 0500   TRIG 176 (H) 07/06/2011 0500   HDL 32 (L) 07/06/2011 0500   CHOLHDL 3.2 07/06/2011 0500   VLDL 35 07/06/2011 0500   LDLCALC 36 07/06/2011 0500      Wt Readings from Last 3 Encounters:  07/28/19 132 lb 9.6 oz (60.1 kg)  07/07/19 132 lb 9.6 oz (60.1 kg)  06/28/19 132 lb 4.4 oz (60 kg)      Other studies Reviewed: 1. Left ventricular ejection fraction, by estimation, is 65 to 70%. The  left ventricle has hyperdynamic function. The left ventricle has no  regional wall motion abnormalities. There is mild concentric left  ventricular hypertrophy. Left ventricular  diastolic parameters are consistent with Grade I diastolic dysfunction  (impaired relaxation).  2. Right ventricular systolic function is normal. The right ventricular  size is normal.  3. The mitral valve is grossly normal. No evidence of mitral valve  regurgitation.  4. The aortic valve is tricuspid. Aortic valve regurgitation is not  visualized. No aortic stenosis is present.  5. The inferior vena cava is normal in size with greater than 50%  respiratory variability, suggesting right atrial pressure of 3 mmHg.   Vascular Ultrasound Summary:  Right: No evidence of common femoral vein obstruction.  Left: There is no evidence of deep vein thrombosis in the lower extremity.   Carotid Ultrasound:06/21/2019 IMPRESSION: Minor carotid atherosclerosis. No hemodynamically significant ICA stenosis. Degree of narrowing estimated at less than 50% bilaterally by ultrasound criteria.  ASSESSMENT AND PLAN:  1.  Uncontrolled hypertension: Multiple issues with admissions for same and confusion about  medications.  On most  recent PCP office visit she was listed as taking clonidine but she states she is not taking it.  She was taken off of losartan and propanolol, reportedly by the patient however records only showed that she was taken off of losartan.  She brings with her multiple medications in her bag.  I have advised that she take losartan HCTZ 50/12.5 mg daily, propanolol XL 60 mg daily.  I will not restart clonidine due to medical noncompliance and confusion as this will cause significant rebound hypertension.  She was found to be hypokalemic with potassium of 3.2.  She is on potassium 10 mEq daily.  Will also add magnesium 200 mg daily.  In 2 weeks she will have BMET and magnesium level drawn to evaluate her response to medications via labs and see her in 1 month to evaluate her response to medication which I began today.  2.  Self-reported internal bleeding: Seeing GI tomorrow.  Currently Plavix and aspirin on hold.  3.  Chronic headaches: To follow-up with PCP for medication management.  4.  PAD: Status post left FSA stent and status post left femoropopliteal on 06/09/2016.  Continue to follow.   Current medicines are reviewed at length with the patient today.  I have spent 45 minutes dedicated to the care of this patient on the date of this encounter to include pre-visit review of records, assessment, management and diagnostic testing,with shared decision making.  Labs/ tests ordered today include: None  Phill Myron. West Pugh, ANP, AACC   08/17/2019 [facility] [place] [address] Office [phone] Fax [phone]  Notice: This dictation was prepared with Dragon dictation along with smaller phrase technology. Any transcriptional errors that result from this process are unintentional and may not be corrected upon review.

## 2019-08-17 NOTE — Telephone Encounter (Signed)
Patient has been scheduled for follow up with Dr. Carlean Purl for 08/19/19

## 2019-08-18 ENCOUNTER — Ambulatory Visit: Payer: Medicare Other | Admitting: Adult Health

## 2019-08-18 ENCOUNTER — Encounter: Payer: Self-pay | Admitting: Adult Health

## 2019-08-18 ENCOUNTER — Other Ambulatory Visit: Payer: Self-pay

## 2019-08-18 VITALS — BP 162/100 | HR 105 | Ht 64.0 in | Wt 126.6 lb

## 2019-08-18 DIAGNOSIS — Z79899 Other long term (current) drug therapy: Secondary | ICD-10-CM | POA: Diagnosis not present

## 2019-08-18 DIAGNOSIS — R072 Precordial pain: Secondary | ICD-10-CM | POA: Diagnosis not present

## 2019-08-18 DIAGNOSIS — I739 Peripheral vascular disease, unspecified: Secondary | ICD-10-CM

## 2019-08-18 DIAGNOSIS — I10 Essential (primary) hypertension: Secondary | ICD-10-CM

## 2019-08-18 MED ORDER — MAGNESIUM 200 MG PO TABS: 200.0000 mg | ORAL_TABLET | Freq: Every day | ORAL | 6 refills | Status: DC

## 2019-08-18 MED ORDER — PROPRANOLOL HCL ER 60 MG PO CP24: 60.0000 mg | ORAL_CAPSULE | Freq: Every evening | ORAL | 3 refills | Status: DC

## 2019-08-18 MED ORDER — LOSARTAN POTASSIUM-HCTZ 50-12.5 MG PO TABS: 1.0000 | ORAL_TABLET | Freq: Every day | ORAL | 3 refills | Status: AC

## 2019-08-18 NOTE — Patient Instructions (Signed)
Medication Instructions:  START- Losartan/HCTz 50-12.5 mg by mouth daily  *If you need a refill on your cardiac medications before your next appointment, please call your pharmacy*   Lab Work: BMP and Magnesium in 2 weeks  If you have labs (blood work) drawn today and your tests are completely normal, you will receive your results only by: Marland Kitchen MyChart Message (if you have MyChart) OR . A paper copy in the mail If you have any lab test that is abnormal or we need to change your treatment, we will call you to review the results.   Testing/Procedures: None Ordered   Follow-Up: At [facility], you and your health needs are our priority.  As part of our continuing mission to provide you with exceptional heart care, we have created designated Provider Care Teams.  These Care Teams include your primary Cardiologist (physician) and Advanced Practice Providers (APPs -  Physician Assistants and Nurse Practitioners) who all work together to provide you with the care you need, when you need it.  We recommend signing up for the patient portal called "MyChart".  Sign up information is provided on this After Visit Summary.  MyChart is used to connect with patients for Virtual Visits (Telemedicine).  Patients are able to view lab/test results, encounter notes, upcoming appointments, etc.  Non-urgent messages can be sent to your provider as well.   To learn more about what you can do with MyChart, go to [email or web address].    Your next appointment:   Tuesday May 18th @ 2:45 pm  The format for your next appointment:   In Person  Provider:   Jory Sims, DNP, ANP

## 2019-08-19 ENCOUNTER — Ambulatory Visit: Payer: Medicare Other | Admitting: Internal Medicine

## 2019-08-19 ENCOUNTER — Encounter: Payer: Self-pay | Admitting: Internal Medicine

## 2019-08-19 ENCOUNTER — Other Ambulatory Visit: Payer: Self-pay | Admitting: *Deleted

## 2019-08-19 VITALS — BP 142/70 | HR 68 | Temp 98.2°F | Ht 63.0 in | Wt 127.4 lb

## 2019-08-19 DIAGNOSIS — K582 Mixed irritable bowel syndrome: Secondary | ICD-10-CM

## 2019-08-19 DIAGNOSIS — E1143 Type 2 diabetes mellitus with diabetic autonomic (poly)neuropathy: Secondary | ICD-10-CM

## 2019-08-19 DIAGNOSIS — K3184 Gastroparesis: Secondary | ICD-10-CM

## 2019-08-19 MED ORDER — LUBIPROSTONE 8 MCG PO CAPS: 8.0000 ug | ORAL_CAPSULE | Freq: Two times a day (BID) | ORAL | 5 refills | Status: DC

## 2019-08-19 MED ORDER — METOCLOPRAMIDE HCL 5 MG PO TABS: 5.0000 mg | ORAL_TABLET | Freq: Four times a day (QID) | ORAL | 0 refills | Status: DC | PRN

## 2019-08-19 NOTE — Patient Outreach (Signed)
[facility]) Care Management  08/19/2019  [name] [birth year] [id number]   Unsuccessful outreach to [place] liaison referred patient  Referral Date: 08/02/19 Referral Source: Netta Cedars, [facility] hospital liaison Referral Reason: Post hospital follow up Diagnoses of Diabetes, CKD Please refer to community telephonic RN for care management services.  Insurance: united healthcare medicare   Last admission 07/28/19 to 07/30/19  Outreach attempt unsuccessful No answer. [name] RN CM left HIPAA [facility] Portability and Accountability Act) compliant voicemail message along with CM's contact info.   Plan: [facility] RN CM scheduled this patient for another call attempt within 4 business days  Beya Tipps L. Lavina Hamman, RN, BSN, [place] Coordinator Office number [phone] Mobile number [phone]  Main [name] number [phone] Fax number [phone]

## 2019-08-19 NOTE — Progress Notes (Signed)
[name] 64 y.o. 05/28/1955 [id number]  Assessment & Plan:   Encounter Diagnoses  Name Primary?  . Irritable bowel syndrome with both constipation and diarrhea Yes  . Diabetic gastroparesis associated with type 2 diabetes mellitus ([place])      We will try a MiraLAX purge and institute Amitiza 8 mcg twice daily. Reglan as needed for nausea and vomiting. Maintain blood sugar control as best she can. Return here as needed  Subjective:   Chief Complaint: Nausea vomiting constipation  HPI [name] was hospitalized at [facility] twice in February of this year.  Records are reviewed.  Brief admission February 14-15 for syncope.  Thought may be related to muscle relaxers.  Then admitted 2 22-2 26 with uncontrolled diabetes near syncope hypokalemia melena and decline in hemoglobin.  Dr. Jearld Adjutant did an EGD that was negative so a colonoscopy was undertaken which showed a 5 mm polyp that was an adenoma and diverticulosis.  Was not clear that she really had a GI bleed I suppose at any rate that leveled off her hemoglobin is almost normal now.  She is complaining of constipation problems and nausea and vomiting.  She has irritable bowel syndrome with both constipation and diarrhea and she has had signs and symptoms suggestive of diabetic gastroparesis as well.  She is using some ondansetron with minimal relief.  She has had about 2 bowel movements in the last couple of weeks she says both small.  She took an over-the-counter laxative and an enema for both of these.  Some abdominal cramps. Allergies  Allergen Reactions  . Sulfa Antibiotics Shortness Of Breath and Palpitations  . 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 Fish Allergy Hives and Swelling    Tongue swelling  . Iodine Swelling  . Metformin And Related Diarrhea  . [name] [Insulin Glargine-Lixisenatide] Itching and Other (See Comments)    "tongue swelling"  . Adhesive [Tape] Rash     Paper tape is ok  . Shellfish Allergy Swelling and Rash    Tongue swelling   Current Meds  Medication Sig  . acetaminophen (TYLENOL) 325 MG tablet Take 2 tablets (650 mg total) by mouth every 6 (six) hours as needed for mild pain (or Fever >/= 101).  Marland Kitchen albuterol (PROAIR HFA) 108 (90 Base) MCG/ACT inhaler 1-2 inhalations every 4-6 hours as needed for cough or wheeze. (Patient taking differently: Inhale 1-2 puffs into the lungs every 4 (four) hours as needed for shortness of breath. )  . ALPRAZolam (XANAX) 0.5 MG tablet Take 0.5 mg by mouth 2 (two) times daily as needed for anxiety.  . Azelastine HCl 0.15 % SOLN Place 2 sprays into both nostrils 2 (two) times daily. (Patient taking differently: Place 2 sprays into both nostrils 2 (two) times daily as needed (allergies). )  . ciprofloxacin (CIPRO) 500 MG tablet Take 500 mg by mouth 2 (two) times daily.  . citalopram (CELEXA) 10 MG tablet Take 10 mg by mouth daily as needed.   . clopidogrel (PLAVIX) 75 MG tablet Take 1 tablet (75 mg total) by mouth daily.  Marland Kitchen EPINEPHrine 0.3 mg/0.3 mL IJ SOAJ injection Inject 0.3 mLs (0.3 mg total) into the muscle once. (Patient taking differently: Inject 0.3 mg into the muscle as needed for anaphylaxis. )  . HYDROcodone-acetaminophen (NORCO) 7.5-325 MG tablet Take 1 tablet by mouth daily as needed for moderate pain.   Marland Kitchen insulin lispro (HUMALOG) 100 UNIT/ML injection Inject 5-12  Units into the skin 2 (two) times daily before a meal. Per sliding scale using OMNIPOD  . loperamide (IMODIUM) 2 MG capsule Take 1 capsule (2 mg total) by mouth as needed for diarrhea or loose stools.  Marland Kitchen losartan-hydrochlorothiazide (HYZAAR) 50-12.5 MG tablet Take 1 tablet by mouth daily.  . Magnesium 200 MG TABS Take 1 tablet (200 mg total) by mouth daily.  . mupirocin ointment (BACTROBAN) 2 % Apply 1 application topically 2 (two) times daily. (Patient taking differently: Apply 1 application topically 2 (two) times daily as needed. )  .  nitroGLYCERIN (NITROSTAT) 0.4 MG SL tablet PLACE 1 TAB UNDER TONGUE EVERY 5 MINS AS NEEDED FOR CHEST PAIN - MAX 3 DOSES THEN 911 (Patient taking differently: Place 0.4 mg under the tongue every 5 (five) minutes as needed for chest pain. MAX 3 DOSES THEN 911)  . ondansetron (ZOFRAN) 4 MG tablet Take 1 tablet (4 mg total) by mouth daily as needed for nausea or vomiting.  . pantoprazole (PROTONIX) 40 MG tablet TAKE 1 TABLET BY MOUTH EVERY DAY (Patient taking differently: Take 40 mg by mouth daily. )  . potassium chloride 20 MEQ TBCR Take 10 mEq by mouth 2 (two) times daily.  . promethazine (PHENERGAN) 25 MG tablet Take 25 mg by mouth every 6 (six) hours as needed.  . propranolol ER (INDERAL LA) 60 MG 24 hr capsule Take 1 capsule (60 mg total) by mouth every evening.  . rosuvastatin (CRESTOR) 40 MG tablet Take 1 tablet (40 mg total) by mouth every morning.  . SUMAtriptan (IMITREX) 100 MG tablet Take 100 mg by mouth every 2 (two) hours as needed for migraine.   . temazepam (RESTORIL) 30 MG capsule Take 30 mg by mouth at bedtime as needed for sleep.  . traZODone (DESYREL) 100 MG tablet Take 1 tablet (100 mg total) by mouth at bedtime.   Past Medical History:  Diagnosis Date  . 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 Anemia    hx  . Angioedema 07/10/2015  . Anxiety   . Arthritis    "neck, left hand" (09/14/2012)  . Asthma   . Cancer ([place]) 1985   ovarian, no treatment except surgery  . Complication of anesthesia    OCCASIONAL TROUBLE TURNING NECK TO RIGHT  . Critical lower limb ischemia    10/2014 s/p L SFA stenting  . Diverticulosis of colon with hemorrhage April 2013  . GERD (gastroesophageal reflux disease)   . GI bleed   . H/O hiatal hernia   . Heart attack [facility])    2003 mild MI, March 2013 mild MI  . Hidradenitis    groin  . History of blood transfusion 1985 AND 2013  . Hyperlipidemia   . Hypertension   . Irritable bowel syndrome   . Left-sided weakness    since stroke, left eye trouble  seeing  . Migraines   . Mild CAD    a. Cath 09/2010: mild luminal irregularities of LAD, 30% prox RCA and 20-30% mRCA, EF 65%.  . Neuropathy   . Obesity   . PAD (peripheral artery disease) ([place])    a. critical limb ischemia s/p PTA/stenting of L SFA 10/2014. c. occ prior SFA stent by angio 01/2016, for possible PV bypass.  . Pneumonia    baby  . Recurrent upper respiratory infection (URI)   . S/P arterial stent-mid Lt SFA 11/03/14 11/04/2014  . Schatzki's ring   . Sinus problem   . Stomach ulcer 1972   non-bleeding  . Stroke (  [place]) 07-2007, 07-2008, 07-2009   total 3 strokes; mild left sided weakness and left eye "jumps".  . Type II diabetes mellitus ([place])   . Vitamin B 12 deficiency 07-15-2013   Past Surgical History:  Procedure Laterality Date  . ABDOMINAL HYSTERECTOMY  1985  . ADENOIDECTOMY    . ANTERIOR CERVICAL DECOMP/DISCECTOMY FUSION  2002  . ANTERIOR CERVICAL DECOMP/DISCECTOMY FUSION N/A 04/08/2014   Procedure: Cervical Six-Seven ANTERIOR CERVICAL DECOMPRESSION/DISCECTOMY FUSION Plating and Bonegraft  2 LEVELS;  Surgeon: Ashok Pall, MD;  Location: [place] NEURO ORS;  Service: Neurosurgery;  Laterality: N/A;  Cervical Six-Seven ANTERIOR CERVICAL DECOMPRESSION/DISCECTOMY FUSION Plating and Bonegraft  2 LEVELS  . APPENDECTOMY  1985  . AXILLARY HIDRADENITIS EXCISION  1990-2008   bilateral  . BACK SURGERY    . BREAST BIOPSY Right 2007  . BREAST CYST EXCISION Right 2008  . BREAST REDUCTION SURGERY    . CARDIAC CATHETERIZATION  2004   mild disease  . CATARACT EXTRACTION W/PHACO Left 05/16/2015   Procedure: CATARACT EXTRACTION PHACO AND INTRAOCULAR LENS PLACEMENT (IOC);  Surgeon: Rutherford Guys, MD;  Location: [facility];  Service: Ophthalmology;  Laterality: Left;  CDE: 4.24  . CATARACT EXTRACTION W/PHACO Right 05/30/2015   Procedure: CATARACT EXTRACTION RIGHT EYE PHACO AND INTRAOCULAR LENS PLACEMENT ;  Surgeon: Rutherford Guys, MD;  Location: [facility]S;  Service: Ophthalmology;  Laterality: Right;   CDE:4.08  . CHOLECYSTECTOMY  1990's  . COLONOSCOPY  08/12/2011   Procedure: COLONOSCOPY;  Surgeon: Ladene Artist, MD,FACG;  Location: [facility] ENDOSCOPY;  Service: Endoscopy;  Laterality: N/A;  . COLONOSCOPY  06/19/2006  . COLONOSCOPY WITH PROPOFOL N/A 07/02/2019   Procedure: COLONOSCOPY WITH PROPOFOL;  Surgeon: Rogene Houston, MD;  [facility]O [facility];  Service: Endoscopy;  Laterality: N/A;  . cyst thigh Right   . ESOPHAGOGASTRODUODENOSCOPY  08/12/2011   Procedure: ESOPHAGOGASTRODUODENOSCOPY (EGD);  Surgeon: Ladene Artist, MD,FACG;  Location: [facility] ENDOSCOPY;  Service: Endoscopy;  Laterality: N/A;  . ESOPHAGOGASTRODUODENOSCOPY  06/04/2005  . ESOPHAGOGASTRODUODENOSCOPY (EGD) WITH PROPOFOL N/A 07/01/2019   Procedure: ESOPHAGOGASTRODUODENOSCOPY (EGD) WITH PROPOFOL;  Surgeon: Rogene Houston, MD;  [facility];  Service: Endoscopy;  Laterality: N/A;  . FEMORAL-POPLITEAL BYPASS GRAFT Left 06/19/2016   Procedure: Left Leg BYPASS GRAFT FEMORAL-POPLITEAL ARTERY;  Surgeon: Serafina Mitchell, MD;  Location: [place];  Service: Vascular;  Laterality: Left;  . GIVENS CAPSULE STUDY  08/13/2011   Procedure: GIVENS CAPSULE STUDY;  Surgeon: Ladene Artist, MD,FACG;  Location: [facility] ENDOSCOPY;  Service: Endoscopy;  Laterality: N/A;  . HAMMER TOE SURGERY Bilateral ~ 2000  . HEMOSTASIS CLIP PLACEMENT  07/02/2019   Procedure: HEMOSTASIS CLIP PLACEMENT;  Surgeon: Rogene Houston, MD;  [facility];  Service: Endoscopy;;  hepatic flexure  . HIATAL HERNIA REPAIR    . HYDRADENITIS EXCISION  01/2011; 03/2012   'groin and abdomen; 03/2012" (09/14/2012)  . HYDRADENITIS EXCISION  04/01/2012   Procedure: EXCISION HYDRADENITIS GROIN;  Surgeon: Pedro Earls, MD;  Location: [facility] ORS;  Service: General;  Laterality: Bilateral;  Excision of Hydradenitis of Perineum  . HYDRADENITIS EXCISION N/A 09/17/2013   Procedure: EXCISION PERINEAL HIDRADENITIS ;  Surgeon: Pedro Earls, MD;  Location: [facility]S;  Service: General;   Laterality: N/A;  also in the pubis area  . LEFT HEART CATH AND CORONARY ANGIOGRAPHY N/A 12/26/2016   Procedure: LEFT HEART CATH AND CORONARY ANGIOGRAPHY;  Surgeon: [name], Peter M, MD;  Location: [place] CV LAB;  Service: Cardiovascular;  Laterality: N/A;  .  MASS EXCISION Right 09/17/2013   Procedure: EXCISION MASS;  Surgeon: Pedro Earls, MD;  Location: [facility]S;  Service: General;  Laterality: Right;  . NISSEN FUNDOPLICATION  99991111  . PERIPHERAL VASCULAR CATHETERIZATION N/A 11/03/2014   Procedure: Lower Extremity Angiography;  Surgeon: Lorretta Harp, MD;  Location: [place] CV LAB;  Service: Cardiovascular;  Laterality: N/A;  . PERIPHERAL VASCULAR CATHETERIZATION N/A 01/29/2016   Procedure: Lower Extremity Angiography;  Surgeon: Lorretta Harp, MD;  Location: [place] CV LAB;  Service: Cardiovascular;  Laterality: N/A;  . POLYPECTOMY  07/02/2019   Procedure: POLYPECTOMY;  Surgeon: Rogene Houston, MD;  [facility];  Service: Endoscopy;;  . POSTERIOR LUMBAR FUSION  2008 X 2  . REDUCTION MAMMAPLASTY  1996?  . TONSILLECTOMY AND ADENOIDECTOMY  1959 AND 2000  . 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  Divorced, living alone in [place].     family history includes Allergic rhinitis in her sister; Aneurysm in her sister; Breast cancer in her mother; Diabetes in her father and sister; Emphysema in an other family member; Heart disease in her father, mother, and sister; Hyperlipidemia in her sister; Hypertension in her mother, sister, and sister; Stroke in her father.   Review of Systems   Objective:   Physical Exam BP (!) 142/70 (BP Location: Left Arm, Patient Position: Sitting, Cuff Size: Normal)   Pulse 68   Temp 98.2 F (36.8 C)   Ht 5\' 3"  (1.6 m)   Wt 127 lb 6  oz (57.8 kg)   BMI 22.56 kg/m  NAD Back mild R CVAT abd soft NT BS +

## 2019-08-19 NOTE — Patient Instructions (Addendum)
If you are age 65 or older, your body mass index should be between 23-30. Your Body mass index is 22.56 kg/m. If this is out of the aforementioned range listed, please consider follow up with your Primary Care Provider.  If you are age 104 or younger, your body mass index should be between 19-25. Your Body mass index is 22.56 kg/m. If this is out of the aformentioned range listed, please consider follow up with your Primary Care Provider.   Miralax Bowel Purge: I recommend that you complete a bowel purge (to clean out your bowels). Please do the following: Purchase a bottle of Miralax over the counter as well as a box of 5 mg dulcolax tablets. Take 4 dulcolax tablets. Wait 1 hour. You will then drink 6-8 capfuls of Miralax mixed in an adequate amount of water/juice/gatorade (you may choose which of these liquids to drink) over the next 2-3 hours. You should expect results within 1 to 6 hours after completing the bowel purge.  We have sent the following medications to your pharmacy for you to pick up at your convenience: Amitiza  Take Reglan as needed for nausea and vomiting.  Follow up as needed.  Thank you for choosing me and [facility].  Silvano Rusk, MD

## 2019-08-20 ENCOUNTER — Encounter: Payer: Self-pay | Admitting: *Deleted

## 2019-08-23 ENCOUNTER — Telehealth: Payer: Self-pay | Admitting: Internal Medicine

## 2019-08-23 NOTE — Telephone Encounter (Signed)
Pt inquired how to do Miralax purge.

## 2019-08-23 NOTE — Telephone Encounter (Signed)
Attempted to return call.  The phone rings several times then goes to a busy signal

## 2019-08-24 ENCOUNTER — Ambulatory Visit: Payer: Self-pay | Admitting: *Deleted

## 2019-08-24 ENCOUNTER — Other Ambulatory Visit: Payer: Self-pay | Admitting: *Deleted

## 2019-08-24 ENCOUNTER — Other Ambulatory Visit: Payer: Self-pay

## 2019-08-24 NOTE — Patient Outreach (Signed)
[facility]) Care Management  08/24/2019  [name] 1956/03/04 [id number]   [name] outreach to [place] liaison referred patient  Referral Date:08/02/19 Referral Source:Netta Cedars, [facility] hospital liaison Referral Reason:Post hospital follow up Diagnoses of Diabetes,CKD Please refer to community telephonic RN for care management services. Insurance:united healthcare medicare  Last admission 07/28/19 to 07/30/19  Outreach attempt successful Patient is able to verify HIPAA ([place]) identifiers, date of birth (DOB) and address  Progression Follow up appointments/status of GI symptoms [name] reports confirms she had follow up appointments with her primary care provider (PCP), cardiologist and Gastroenterologist, Dr Carlean Purl. She confirms her cardiology staff restarted her on her cardiac medicines. Reviewed her medicines with her. She states Dr Carlean Purl voiced concern with her health during her recent hospitalization. She reports discussing with Dr Carlean Purl that she was about to "give up" She reports her church deaconess went with her to her appointment with Dr Carlean Purl.   She confirms she completed her GI purge on Sunday and felt weak and "sick" after completing it. She reported left sided back pain She reports some discomfort today Pain assessed as a 3 on a scale of 1-10 with 10 being the worst pain. She reports resting and taking medications as ordered  She reports having a hotdog that has not agreed with her. She consulted her Gastroenterology RN about completing another GI purge. She reports she will complete a GI purge but would prefer to take only "two dulcolax" She and [facility] RN CM discussed the importance of only completing GI purges prn and not on a routine basis She reports a weight on 08/22/19 of 121 lbs per her home scales Her last stool was reported on Sunday 08/22/19   Dietitian/nutritionist- diet and  intake  [name] RN CM inquired if she had ever had a nutrition or dietitian consult. She voiced interest in having a one on one session with a dietitian/nutritionist. She agrees to being sent EMMI diet education for Irritable bowel syndrome (IBS) diet. She discussed foods she has enjoyed eating to include coconut cake, hot dogs, brussell sprouts, baked potatoes and potato soup. She reports her daughter's boyfriend is a vegetarian and has offered to cook her a meal.  EMS cost concern  She voiced concern about a recent EMS/ambulance bill with a charge of $500 after the medicare coverage. She reports working on a payment arrangement with the EMS company.  She agrees to have [facility] RN CM consult [facility] for any possible resource that may assist with this bill.  [name] RN CM consulted [name] SW about any possible resource that may assist with this EMS bill.   EMMIs [name] RN CM sent [name] EMMI education on hypertension (high Blood pressure), what is chronic kidney disease (CKD)?, IBS diet, high fiber diet, food allergy and diabetic meal planning via her listed e mail address as discussed     Social:[name] is a 64 year old disabled divorced female who lives alone She reports having minimal support system. She does have a daughter and grand daughter. She has a niece that will be graduating from medical school (pediatric oncology) she consults with. She has a church member to assist with food. She grew up in [place], [place] schoolandcompletedlegal secretary trainingShestarted paralegal training butdid notfinished due to medical issues.She is a Patent examiner. She still has a good relationship with her ex husband and ex mother in law.She reports being independent with her care needs She reports  having home health and other care services previously but does not prefer any at this time. She reports being able to complete the exercises taught to her by her previous physical therapist. She denies issues with  transportation to medical appointments. She reports her Church member visits to assist her with her medication containers. She is active in her church and ushers. She reports phone calls from her pastor and his wife.    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")  [id number], Shower seat, hand held shower head   [facility] RN CM will follow up with Jennifer [place] within the next 7-10 business days  Pt encouraged to return a call to [facility] RN CM prn Routed note to MD  [facility] CM Care Plan Problem One     Most Recent Value  Care Plan Problem One  Risk for hospital readmission secondary to recently diagnosed CKD  Role Documenting the Problem One  Care Management Telephonic Coordinator  Care Plan for Problem One  Active  [name] Long Term Goal   over the next 90 days patient will be able to verbalize home care action plans for major medical conditions (CKD, DM, HTN) during futrue outreach  [place] Term Goal Start Date  08/05/19  Interventions for Problem One Long Term Goal  assessed for worsening symptoms, sent EMMI education on hypertension (high Blood pressure), what is chronic kidney disease (CKD)?, IBS diet, high fiber diet, food allergy and diabetic meal planning via her listed e mail address as discussed   [name] CM Short Term Goal #1   Over the next 31 days, patient will not experience hospital readmission, as evidence by patient reporting and review of EMR during [facility] RN CCM outreach  [facility] CM Short Term Goal #1 Start Date  08/05/19  Interventions for Short Term Goal #1  assessed for worsening  symptoms,and encouraged her to report any to her MDs, provided encouragement/support  [name] CM Short Term Goal #2   over the next 45 days patient will be offered a session with a dietitan/nutritionist as stated in future outreach  [facility] CM Short Term Goal #2 Start Date  08/24/19  Interventions for Short Term Goal #2  Inquired about sessions for diet managment, discuseed options, discussed IBS diet sent EMMI education on hypertension (high Blood pressure), what is chronic kidney disease (CKD)?, IBS diet, high fiber diet, food allergy and diabetic meal planning via her listed e mail address as discussed provided encouraged ment       Zineb Glade L. Lavina Hamman, RN, BSN, [place] Coordinator Office number [phone] Mobile number [phone]  Main [name] number [phone] Fax number [phone]

## 2019-08-24 NOTE — Telephone Encounter (Signed)
Patient notified that she is to do the Miralax purge just once and take the Amitiza BID.  All of her questions were answered.  She will let us know if the Amitiza doesn't work, she will call back for the higher dose.

## 2019-08-27 ENCOUNTER — Telehealth: Payer: Self-pay | Admitting: Internal Medicine

## 2019-08-27 ENCOUNTER — Other Ambulatory Visit: Payer: Self-pay | Admitting: *Deleted

## 2019-08-27 DIAGNOSIS — I10 Essential (primary) hypertension: Secondary | ICD-10-CM | POA: Diagnosis not present

## 2019-08-27 DIAGNOSIS — E1149 Type 2 diabetes mellitus with other diabetic neurological complication: Secondary | ICD-10-CM | POA: Diagnosis not present

## 2019-08-27 DIAGNOSIS — E1140 Type 2 diabetes mellitus with diabetic neuropathy, unspecified: Secondary | ICD-10-CM | POA: Diagnosis not present

## 2019-08-27 DIAGNOSIS — R110 Nausea: Secondary | ICD-10-CM | POA: Diagnosis not present

## 2019-08-27 DIAGNOSIS — D631 Anemia in chronic kidney disease: Secondary | ICD-10-CM | POA: Diagnosis not present

## 2019-08-27 DIAGNOSIS — E876 Hypokalemia: Secondary | ICD-10-CM | POA: Diagnosis not present

## 2019-08-27 DIAGNOSIS — E1121 Type 2 diabetes mellitus with diabetic nephropathy: Secondary | ICD-10-CM | POA: Diagnosis not present

## 2019-08-27 DIAGNOSIS — E1122 Type 2 diabetes mellitus with diabetic chronic kidney disease: Secondary | ICD-10-CM | POA: Diagnosis not present

## 2019-08-27 NOTE — Telephone Encounter (Signed)
Arrie Senate from [facility] is calling- she states that she spoke with the patient and patient would like a referral to a nutritionist or dietician. Asking if we can call the patient to discuss with her.

## 2019-08-27 NOTE — Patient Outreach (Addendum)
[facility]) Care Management  08/27/2019  [name] [birth year] [id number]   [place] coordination- Nutrition/dietitian referral   [facility] RN CM called Dr Carlean Purl office to inquire about assistance with a nutrition/dietitian consult as agreed upon by [name] with [name] to discuss a nutrition/dietitian consult and ask for assistance for this service for [name] reports a message will be sent to RN/MD  Plans [facility] RN CM will follow up with [name] within the next 7-10 business days  Routed note to MD [facility] CM Care Plan Problem One     Most Recent Value  Care Plan Problem One  Risk for hospital readmission secondary to recently diagnosed CKD  Role Documenting the Problem One  Care Management Telephonic Coordinator  Care Plan for Problem One  Active  [facility] Long Term Goal   over the next 90 days patient will be able to verbalize home care action plans for major medical conditions (CKD, DM, HTN) during futrue outreach  [place] Term Goal Start Date  08/05/19  Interventions for Problem One Long Term Goal  call to GI to request nutrition/dietitan referral for patient  [facility] CM Short Term Goal #1   Over the next 31 days, patient will not experience hospital readmission, as evidence by patient reporting and review of EMR during [facility] RN CCM outreach  [facility] CM Short Term Goal #1 Start Date  08/05/19  Interventions for Short Term Goal #1  call to GI to request nutrition/dietitan referral for patient  [facility] CM Short Term Goal #2   over the next 45 days patient will be offered a session with a dietitan/nutritionist as stated in future outreach  [facility] CM Short Term Goal #2 Start Date  08/24/19  Interventions for Short Term Goal #2  call to GI to request nutrition/dietitan referral for patient       Helt L. Lavina Hamman, RN, BSN, [place] Coordinator Office number [phone] Mobile number [phone]  Main [name] number  [phone] Fax number [phone]

## 2019-08-27 NOTE — Telephone Encounter (Signed)
Patient advised that she needs to discuss with her PCP.  She thanked me for the call

## 2019-09-03 ENCOUNTER — Other Ambulatory Visit: Payer: Self-pay | Admitting: *Deleted

## 2019-09-03 NOTE — Patient Outreach (Addendum)
[facility]) Care Management  09/03/2019  HERLINDA MARMON 1955/11/10 [id number]   Unsuccessful outreach to [place] liaison referred patient  Referral Date:08/02/19 Referral Source:Netta Cedars, [facility] hospital liaison Referral Reason:Post hospital follow up Diagnoses of Diabetes,CKD Please refer to community telephonic RN for care management services. Insurance:united healthcare medicare  Last admission 07/28/19 to 07/30/19  Outreach attempt unsuccessful No answer. [name] RN CM left HIPAA [facility] Portability and Accountability Act) compliant voicemail message along with CM's contact info.   Patient sent further EMMIs on PAD, HTN, diverticulosis and gastroparesis to her listed e mail address in EPIC  Plan: [facility] RN CM scheduled this patient for another call attempt within 4-7  business days  Lizzie An L. Lavina Hamman, RN, BSN, [place] Coordinator Office number [phone] Mobile number [phone]  Main [name] number [phone] Fax number [phone]

## 2019-09-07 ENCOUNTER — Other Ambulatory Visit: Payer: Self-pay | Admitting: *Deleted

## 2019-09-07 ENCOUNTER — Other Ambulatory Visit (INDEPENDENT_AMBULATORY_CARE_PROVIDER_SITE_OTHER): Payer: Medicare Other

## 2019-09-07 DIAGNOSIS — I10 Essential (primary) hypertension: Secondary | ICD-10-CM

## 2019-09-07 NOTE — Patient Outreach (Signed)
[name]) Care Management  09/07/2019  [name] 05-04-1956 [id number]   Second Unsuccessful outreach to [place] liaison referred patient  Referral Date:08/02/19 Referral Source:Netta Cedars, [facility] hospital liaison Referral Reason:Post hospital follow up Diagnoses of Diabetes,CKD Please refer to community telephonic RN for care management services. Insurance:united healthcare medicare  Last admission 07/28/19 to 07/30/19   Last successful outreach on 08/27/19  [name] did leave a returned message on 09/03/19 1810 that she was doing better, sorry she had missed [facility] RN CM's call and had received some educational information  [facility] RN CM had consulted with a [facility] about EMS resources for cost concerns on 08/26/19 and none are available   Outreach attemptunsuccessful No answer. [name] RN CM left HIPAA [facility] Portability and Accountability Act) compliant voicemail message along with CM's contact info.   Patient sent further EMMIs on PAD, HTN, diverticulosis and gastroparesis to her listed e mail address in EPIC  Plan: [facility] RN CM scheduled this patient for another call attempt within 7-14 business days  Kimberly L. Lavina Hamman, RN, BSN, [place] Coordinator Office number [phone] Mobile number [phone]  Main [name] number [phone] Fax number [phone]

## 2019-09-08 ENCOUNTER — Other Ambulatory Visit: Payer: Self-pay | Admitting: *Deleted

## 2019-09-08 NOTE — Patient Outreach (Signed)
[facility]) Care Management  09/08/2019  [name] 02-01-1956 [id number]   [facility] outreach to patient after voice message received  Referral Date:08/02/19 Referral Source:Netta Cedars, [facility] hospital liaison Referral Reason:Post hospital follow up Diagnoses of Diabetes,CKD Please refer to community telephonic RN for care management services. Insurance:united healthcare medicare  Last admission 07/28/19 to 07/30/19  Outreach attemptsuccessful Patient is able to verify HIPAA ([place] and Accountability Act) identifiers, date of birth (DOB) and address  Progression  GI symptoms Continue manageable symptoms but has gained weight went from 119 pounds (lbs) to 122 lbs She denies other worsening symptoms   [name] reports doing better She reports being happy related to a recent car purchase  [facility] RN CM sent copies of her previously e-mailed EMMIs (gastroparesis (delayed gastric emptying), diverticulosis, diabetic meal planning, colon polyps, high blood pressure (hypertension): what you can do, IBS diet, food allergy, high fiber diet) to her home address She states the address listed is incorrect [facility] RN CM and the patient discuss her keeping a journal, continuing her gratitude list and making herself a priority. [name] RN CM provided encouragement and support to her  She reports preparing to go to bible study  Social:[name] is a 64 year old disabled divorced female who lives alone She reports having minimal support system. She does have a daughter and grand daughter. She has a niece that will be graduating from medical school (pediatric oncology) she consults with. She has a church member to assist with food. She grew up in [place], [place] schoolandcompletedlegal secretary trainingShestarted paralegal training butdid notfinished due to medical issues.She is a Patent examiner. She still has a good relationship with her ex  husband and ex mother in law.She reports being independent with her care needs She reports having home health and other care services previously but does not prefer any at this time. She reports being able to complete the exercises taught to her by her previous physical therapist. She denies issues with transportation to medical appointments. She reports her Church member visits to assist her with her medication containers. She is active in her church and ushers. She reports phone calls from her pastor and his wife.    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")  KNL:ZJQB, Shower seat, hand held shower head   Plans [facility] RN CM will follow up with [name] within the next 7-10 business days  Pt encouraged to return a call to [facility] RN CM prn Routed note to MD  [facility] CM Care Plan Problem One     Most Recent Value  Care Plan Problem One  Risk for hospital readmission secondary to recently diagnosed CKD  Role Documenting the Problem One  Care Management Telephonic Coordinator  Care Plan for Problem One  Active  [name] Long Term Goal   over the next 90 days patient will be able to verbalize home care action plans for major medical conditions (CKD, DM, HTN) during futrue outreach  [facility] Long Term Goal Start Date  08/05/19  Interventions for Problem One Long Term Goal  assessed for worsening symptoms, support and encouragement provided, EMMIs (gastroparesis (delayed gastric emptying), diverticulosis, diabetic meal planning, colon polyps, high blood pressure (hypertension): what you can do, IBS diet,  food allergy, high fiber diet) to her home address  [name] CM Short Term Goal #1   Over the next 31 days, patient will not experience hospital readmission, as evidence by patient reporting and review of EMR during [place] outreach  [facility] CM Short Term Goal #1 Start Date  08/05/19  [facility] CM Short Term Goal #1 Met Date  09/08/19  [name] CM Short Term Goal #2   over the next 45 days patient will be offered a session with a dietitan/nutritionist as stated in future outreach  [facility] CM Short Term Goal #2 Start Date  08/24/19  Interventions for Short Term Goal #2  unable to assess during this outreach, EMMIs (gastroparesis (delayed gastric emptying), diverticulosis, diabetic meal planning, colon polyps, high blood pressure (hypertension): what you can do, IBS diet, food allergy, high fiber diet) to her home address     [place]. Lavina Hamman, RN, BSN, [place] Coordinator Office number [phone] Mobile number [phone]  Main [name] number [phone] Fax number [phone]

## 2019-09-14 ENCOUNTER — Ambulatory Visit: Payer: Self-pay | Admitting: *Deleted

## 2019-09-14 ENCOUNTER — Other Ambulatory Visit: Payer: Self-pay | Admitting: *Deleted

## 2019-09-14 NOTE — Patient Outreach (Signed)
[facility]) Care Management  09/14/2019  [name] 25-Mar-1956 [id number]   Unsuccessful outreach after patient left a voice message   [facility] RN CM returned call to [name] after a voice message was received from her in [facility] RN CM voice mail box [facility] RN CM left a message informing her that [facility] RN CM had consulted with [facility] to find out their is not a known grant to assist with EMS bill and that it was recommended for her to contact the EMS company for billing arrangements [facility] RN CM also left message informing her that CM is not familiar with a contact number for [employer] medicare's annual home visit staff. [name] RN CM recommended contact to her [employer] medicare customer service number  Plans [facility] RN CM will follow up with Mrs Stain within the next 7-14 business days   [place]. Lavina Hamman, RN, BSN, [place] Coordinator Office number [phone] Mobile number [phone]  Main [name] number [phone] Fax number [phone]

## 2019-09-15 DIAGNOSIS — M25551 Pain in right hip: Secondary | ICD-10-CM | POA: Diagnosis not present

## 2019-09-20 ENCOUNTER — Telehealth: Payer: Self-pay | Admitting: Adult Health

## 2019-09-20 NOTE — Telephone Encounter (Signed)
Spoke to pt to reschedule appt per Jory Sims, DNP. Pt was originally scheduled to see Curt Bears on 09/21/19. However, Curt Bears will no longer be in the office that day.   Explained to pt, who verbalized ok to reschedule. Pt rescheduled to 09/24/18 with Dr. Gwenlyn Found at 8:00 AM.

## 2019-09-21 ENCOUNTER — Ambulatory Visit: Payer: Medicare Other | Admitting: Adult Health

## 2019-09-23 ENCOUNTER — Other Ambulatory Visit: Payer: Self-pay | Admitting: *Deleted

## 2019-09-23 NOTE — Patient Outreach (Signed)
[facility]) Care Management  09/23/2019  AKUA GRUBICH [birth year] [id number]   Unsuccessful outreach after patient left a voice message   [facility] RN CM returned call to Mrs Mcnellis after a voice message was received from her in [facility] RN CM voice mail box [facility] RN CM left a message informing her that [facility] RN CM had consulted with [facility] to find out their is not a known grant to assist with EMS bill and that it was recommended for her to contact the EMS company for billing arrangements [facility] RN CM also left message informing her that CM is not familiar with a contact number for [employer] medicare's annual home visit staff. [name] RN CM recommended contact to her [employer] medicare customer service number  Plans [facility] RN CM will follow up with Mrs Bopp within the next 7-14 business days   [place]. Lavina Hamman, RN, BSN, [place] Coordinator Office number [phone] Mobile number [phone]  Main [name] number [phone] Fax number [phone]

## 2019-09-24 ENCOUNTER — Other Ambulatory Visit: Payer: Self-pay

## 2019-09-24 ENCOUNTER — Ambulatory Visit: Payer: Self-pay | Admitting: *Deleted

## 2019-09-24 ENCOUNTER — Ambulatory Visit (INDEPENDENT_AMBULATORY_CARE_PROVIDER_SITE_OTHER): Payer: Medicare Other | Admitting: Cardiovascular Disease

## 2019-09-24 ENCOUNTER — Encounter: Payer: Self-pay | Admitting: Cardiovascular Disease

## 2019-09-24 DIAGNOSIS — I2510 Atherosclerotic heart disease of native coronary artery without angina pectoris: Secondary | ICD-10-CM | POA: Diagnosis not present

## 2019-09-24 DIAGNOSIS — E785 Hyperlipidemia, unspecified: Secondary | ICD-10-CM

## 2019-09-24 DIAGNOSIS — I10 Essential (primary) hypertension: Secondary | ICD-10-CM

## 2019-09-24 DIAGNOSIS — Z95828 Presence of other vascular implants and grafts: Secondary | ICD-10-CM

## 2019-09-24 DIAGNOSIS — Z720 Tobacco use: Secondary | ICD-10-CM

## 2019-09-24 NOTE — Assessment & Plan Note (Signed)
History of mild CAD by cath performed by Dr. [name] 12/26/2016 in the setting of chest pain.

## 2019-09-24 NOTE — Patient Instructions (Signed)
Medication Instructions:  The current medical regimen is effective;  continue present plan and medications.  *If you need a refill on your cardiac medications before your next appointment, please call your pharmacy*   Follow-Up: At [facility], you and your health needs are our priority.  As part of our continuing mission to provide you with exceptional heart care, we have created designated Provider Care Teams.  These Care Teams include your primary Cardiologist (physician) and Advanced Practice Providers (APPs -  Physician Assistants and Nurse Practitioners) who all work together to provide you with the care you need, when you need it.  We recommend signing up for the patient portal called "MyChart".  Sign up information is provided on this After Visit Summary.  MyChart is used to connect with patients for Virtual Visits (Telemedicine).  Patients are able to view lab/test results, encounter notes, upcoming appointments, etc.  Non-urgent messages can be sent to your provider as well.   To learn more about what you can do with MyChart, go to [email or web address].    Your next appointment:   Jory Sims, NP- 6 months Quay Burow, MD - 12 months

## 2019-09-24 NOTE — Assessment & Plan Note (Signed)
History of PAD status post left SFA intervention by myself 11/03/2014 with directional arthrectomy followed by Viabahn stenting.  Ultimately, her Viabahn stent was shown to be occluded and she underwent elective left femoropopliteal bypass graft using PTFE by Dr. Trula Slade 06/09/2016.  Her most recent Dopplers performed a year ago revealed the stent to be widely patent.

## 2019-09-24 NOTE — Assessment & Plan Note (Signed)
History of essential hypertension a blood pressure measured today at 116/66.  She is on losartan/hydrochlorothiazide, and propranolol.

## 2019-09-24 NOTE — Assessment & Plan Note (Signed)
When I saw the patient back 2 months ago she has stopped smoking.  Unfortunately, since that time she has began to smoke again.

## 2019-09-24 NOTE — Progress Notes (Signed)
09/24/2019 [name]   1955/11/25  [id number]  Primary Physician [name], MD Primary Cardiologist: Lorretta Harp MD [name], [place]  HPI:  [name] is a 64 y.o.  thin-appearing divorced African-American female mother of one, grandmother of one grandchild who is currently disabled because of a prior stroke. She was referred by Dr. Melony Overly, from [facility] podiatry, for evaluation and treatment of critical limb ischemia. I last saw her in the office 07/07/2019. Her cardiovascular risk factor profile is notable for a strong family history of heart disease with the father about a stent, mother who had bypass surgery and a sister who died at age 36 of a myocardial infarction. She has never had a heart attack but apparently has had a stroke in the past with some mild left-sided residua. She has a history of tobacco abuse in the last 43 years of one third pack per day trying to quit currently. She has treated diabetes, hypertension and hyperlipidemia. She had the onset of left ear pain approximately 3 months ago with progression to critical limb ischemia in early June with ischemic appearing left fourth toe. Dopplers in our office performed yesterday revealed a left ABI 0.6 with an occluded left SFA and one-vessel runoff. She will need to be admitted for angiography and potential endovascular therapy for critical limb ischemia. Angiogram to her on 11/03/14 revealing occluded left SFA. I performed [facility] one directional atherectomy, PTA and stenting using a Viabahn covered stent with an excellent angiographic and clinical result. Her pain has resolved. Her critical limb ischemia has resolved. Her Dopplers have normalized. Since I saw her approximately 6 weeks ago she's had 4 episodes of night nitroglycerin responsive chest pain. Recent Dopplers did show a decline in her left ABI from 1.1 6 months ago to .91 with a simultaneous increase in velocity in her mid left SFA. Dopplers performed  12/20/15 revealed a further reduction in her left ABI down to 0.71 with a high-frequency signal in her mid left SFA and worsening symptoms of claudication   I performed angiography on her 01/29/2016 revealing an occluded left SFA stent. She ultimately underwent left femoropopliteal bypass grafting 06/09/2016 by Dr. Trula Slade using PTFE. He follows his noninvasively in his office. Her symptoms resolved. She also underwent cardiac catheterization by Dr. [name] because of chest pain 12/26/2016 revealing minimal CAD. Unfortunately, she has gone back to smoking10cigarettes a day.  Since I saw her in the office 2 months ago she has done better.  When I saw her 2 months ago she had been admitted to the hospital twice with dizziness of unclear etiology.  She does also admit to unexplained weight loss.  Unfortunately, she is gone back to smoking.  She denies chest pain or shortness of breath.  Blood pressure is under better control.    Current Meds  Medication Sig   acetaminophen (TYLENOL) 325 MG tablet Take 2 tablets (650 mg total) by mouth every 6 (six) hours as needed for mild pain (or Fever >/= 101).   albuterol (PROAIR HFA) 108 (90 Base) MCG/ACT inhaler 1-2 inhalations every 4-6 hours as needed for cough or wheeze. (Patient taking differently: Inhale 1-2 puffs into the lungs every 4 (four) hours as needed for shortness of breath. )   ALPRAZolam (XANAX) 0.5 MG tablet Take 0.5 mg by mouth 2 (two) times daily as needed for anxiety.   Azelastine HCl 0.15 % SOLN Place 2 sprays into both nostrils 2 (two) times daily. (Patient  taking differently: Place 2 sprays into both nostrils 2 (two) times daily as needed (allergies). )   cefUROXime (CEFTIN) 500 MG tablet Take 500 mg by mouth 2 (two) times daily.   ciprofloxacin (CIPRO) 500 MG tablet Take 500 mg by mouth 2 (two) times daily.   citalopram (CELEXA) 10 MG tablet Take 10 mg by mouth daily as needed.    clopidogrel (PLAVIX) 75 MG tablet Take 1 tablet  (75 mg total) by mouth daily.   EPINEPHrine 0.3 mg/0.3 mL IJ SOAJ injection Inject 0.3 mLs (0.3 mg total) into the muscle once. (Patient taking differently: Inject 0.3 mg into the muscle as needed for anaphylaxis. )   HYDROcodone-acetaminophen (NORCO) 7.5-325 MG tablet Take 1 tablet by mouth daily as needed for moderate pain.    insulin lispro (HUMALOG) 100 UNIT/ML injection Inject 5-12 Units into the skin 2 (two) times daily before a meal. Per sliding scale using OMNIPOD   loperamide (IMODIUM) 2 MG capsule Take 1 capsule (2 mg total) by mouth as needed for diarrhea or loose stools.   losartan-hydrochlorothiazide (HYZAAR) 50-12.5 MG tablet Take 1 tablet by mouth daily.   lubiprostone (AMITIZA) 8 MCG capsule Take 1 capsule (8 mcg total) by mouth 2 (two) times daily with a meal.   Magnesium 200 MG TABS Take 1 tablet (200 mg total) by mouth daily.   metoCLOPramide (REGLAN) 5 MG tablet Take 1 tablet (5 mg total) by mouth every 6 (six) hours as needed for nausea or vomiting.   mupirocin ointment (BACTROBAN) 2 % Apply 1 application topically 2 (two) times daily. (Patient taking differently: Apply 1 application topically 2 (two) times daily as needed. )   nitroGLYCERIN (NITROSTAT) 0.4 MG SL tablet PLACE 1 TAB UNDER TONGUE EVERY 5 MINS AS NEEDED FOR CHEST PAIN - MAX 3 DOSES THEN 911 (Patient taking differently: Place 0.4 mg under the tongue every 5 (five) minutes as needed for chest pain. MAX 3 DOSES THEN 911)   ondansetron (ZOFRAN) 4 MG tablet Take 1 tablet (4 mg total) by mouth daily as needed for nausea or vomiting.   pantoprazole (PROTONIX) 40 MG tablet TAKE 1 TABLET BY MOUTH EVERY DAY (Patient taking differently: Take 40 mg by mouth daily. )   potassium chloride 20 MEQ TBCR Take 10 mEq by mouth 2 (two) times daily.   propranolol ER (INDERAL LA) 60 MG 24 hr capsule Take 1 capsule (60 mg total) by mouth every evening.   rosuvastatin (CRESTOR) 40 MG tablet Take 1 tablet (40 mg total) by mouth  every morning.   SUMAtriptan (IMITREX) 100 MG tablet Take 100 mg by mouth every 2 (two) hours as needed for migraine.    temazepam (RESTORIL) 30 MG capsule Take 30 mg by mouth at bedtime as needed for sleep.   traZODone (DESYREL) 100 MG tablet Take 1 tablet (100 mg total) by mouth at bedtime.     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   Fish Allergy Hives and Swelling    Tongue swelling   Iodine Swelling   Metformin And Related Diarrhea   [name] [Insulin Glargine-Lixisenatide] Itching and Other (See Comments)    "tongue swelling"   Adhesive [Tape] Rash    Paper tape is ok   Shellfish Allergy Swelling and Rash    Tongue swelling    Social History   Socioeconomic History   Marital status:  Divorced    Spouse name: Not on file   Number of children: 1   Years of education: some college   Highest education level: Some college, no degree  Occupational History    Employer: UNEMPLOYED    Comment: disabled, notary  Tobacco Use   Smoking status: Former Smoker    Packs/day: 0.50    Years: 41.00    Pack years: 20.50    Types: Cigarettes    Quit date: 01/15/2016    Years since quitting: 3.6   Smokeless tobacco: Never Used   Tobacco comment: Getting ready to start nicotine patches RX by Dr. Gwenlyn Found per pt.  Substance and Sexual Activity   Alcohol use: No    Alcohol/week: 0.0 standard drinks   Drug use: No    Types: "Crack" cocaine    Comment: 05/09/2015.  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  Divorced, living alone in [place].     Social Determinants of Health   Financial Resource  Strain:    Difficulty of Paying Living Expenses:   Food Insecurity:    Worried About Charity fundraiser in the Last Year:    Arboriculturist in the Last Year:   Transportation Needs: No Transportation Needs   Lack of Transportation (Medical): No   Lack of Transportation (Non-Medical): No  Physical Activity:    Days of Exercise per Week:    Minutes of Exercise per Session:   Stress:    Feeling of Stress :   Social Connections:    Frequency of Communication with Friends and Family:    Frequency of Social Gatherings with Friends and Family:    Attends Religious Services:    Active Member of Clubs or Organizations:    Attends Music therapist:    Marital Status:   Intimate Partner Violence:    Fear of Current or Ex-Partner:    Emotionally Abused:    Physically Abused:    Sexually Abused:      Review of Systems: General: negative for chills, fever, night sweats or weight changes.  Cardiovascular: negative for chest pain, dyspnea on exertion, edema, orthopnea, palpitations, paroxysmal nocturnal dyspnea or shortness of breath Dermatological: negative for rash Respiratory: negative for cough or wheezing Urologic: negative for hematuria Abdominal: negative for nausea, vomiting, diarrhea, bright red blood per rectum, melena, or hematemesis Neurologic: negative for visual changes, syncope, or dizziness All other systems reviewed and are otherwise negative except as noted above.    Blood pressure 116/66, pulse 71, height 5\' 4"  (1.626 m), weight 121 lb 9.6 oz (55.2 kg), SpO2 99 %.  General appearance: alert and no distress Neck: no adenopathy, no carotid bruit, no JVD, supple, symmetrical, trachea midline and thyroid not enlarged, symmetric, no tenderness/mass/nodules Lungs: clear to auscultation bilaterally Heart: regular rate and rhythm, S1, S2 normal, no murmur, click, rub or gallop Extremities: extremities normal, atraumatic, no cyanosis or  edema Pulses: 2+ and symmetric Skin: Skin color, texture, turgor normal. No rashes or lesions Neurologic: Alert and oriented X 3, normal strength and tone. Normal symmetric reflexes. Normal coordination and gait  EKG not performed today  ASSESSMENT AND PLAN:   Essential hypertension History of essential hypertension a blood pressure measured today at 116/66.  She is  on losartan/hydrochlorothiazide, and propranolol.  S/P femoral-popliteal bypass surgery History of PAD status post left SFA intervention by myself 11/03/2014 with directional arthrectomy followed by Viabahn stenting.  Ultimately, her Viabahn stent was shown to be occluded and she underwent elective left femoropopliteal bypass graft using PTFE by Dr. Trula Slade 06/09/2016.  Her most recent Dopplers performed a year ago revealed the stent to be widely patent.  Hyperlipidemia LDL goal <70 Lipidemia on statin therapy with lipid profile performed 06/16/2019 revealed a total cholesterol of 119 and HDL 36.  Mild CAD History of mild CAD by cath performed by Dr. [name] 12/26/2016 in the setting of chest pain.  Tobacco abuse When I saw the patient back 2 months ago she has stopped smoking.  Unfortunately, since that time she has began to smoke again.      Lorretta Harp MD FACP,FACC,FAHA, [facility] 09/24/2019 8:43 AM

## 2019-09-24 NOTE — Assessment & Plan Note (Signed)
Lipidemia on statin therapy with lipid profile performed 06/16/2019 revealed a total cholesterol of 119 and HDL 36.

## 2019-09-29 ENCOUNTER — Other Ambulatory Visit: Payer: Self-pay | Admitting: *Deleted

## 2019-09-29 NOTE — Patient Outreach (Signed)
[facility]) Care Management  09/29/2019  [name] 1955/11/08 [id number]   Unsuccessful outreach to complex care patient   Jennifer Kienzle was referred to [facility] on 08/02/19 by [facility] hospital liaison, [facility] post hospital follow up for complex are  (dx Diabetes (DM) type 2 , Chronic Kidney disease (CKD),  Last admission 07/28/19 to 07/30/19   Last successful outreach was on 09/08/19  Social:[name] is a 64 year old disabled divorced female who lives alone She reports having minimal support system. She does have a daughter and grand daughter. She has a niece that will be graduating from medical school (pediatric oncology) she consults with. She has a church member to assist with food. She grew up in [place], [place] schoolandcompletedlegal secretary trainingShestarted paralegal training butdid notfinished due to medical issues.She is a Patent examiner. She still has a good relationship with her ex husband and ex mother in law.She reports being independent with her care needs She reports having home health and other care servicespreviouslybut does not prefer any at this time. She reports being able to complete the exercises taught to her by her previous physical therapist. She denies issues with transportation to medical appointments. She reports her Church member visits to assist her with her medication containers. She is active in her church and ushers. She reports phone calls from her pastor and his wife.   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 Lavina Hamman, RN, BSN, [place] Coordinator Office number [phone] Mobile number [phone]  Main [name] number [phone] Fax number [phone]

## 2019-10-06 DIAGNOSIS — R197 Diarrhea, unspecified: Secondary | ICD-10-CM | POA: Diagnosis not present

## 2019-10-06 DIAGNOSIS — K3184 Gastroparesis: Secondary | ICD-10-CM | POA: Diagnosis not present

## 2019-10-06 DIAGNOSIS — R110 Nausea: Secondary | ICD-10-CM | POA: Diagnosis not present

## 2019-10-06 DIAGNOSIS — E1165 Type 2 diabetes mellitus with hyperglycemia: Secondary | ICD-10-CM | POA: Diagnosis not present

## 2019-10-08 ENCOUNTER — Other Ambulatory Visit: Payer: Self-pay | Admitting: Cardiovascular Disease

## 2019-10-08 DIAGNOSIS — I739 Peripheral vascular disease, unspecified: Secondary | ICD-10-CM

## 2019-10-08 DIAGNOSIS — Z95828 Presence of other vascular implants and grafts: Secondary | ICD-10-CM

## 2019-10-11 ENCOUNTER — Other Ambulatory Visit: Payer: Self-pay | Admitting: *Deleted

## 2019-10-11 NOTE — Patient Outreach (Signed)
[facility]) Care Management  10/11/2019  [name] 08-18-1955 [id number]   Unsuccessful outreach to complex care patient   Jennifer Elson was referred to [facility] on 08/02/19 by [facility] hospital liaison, [facility] post hospital follow up for complex are  (dx Diabetes (DM) type 2, Chronic Kidney disease (CKD),  Last admission 07/28/19 to 07/30/19   Last successful outreach was on 09/08/19  Social:[name] is a 64 year old disabled divorced female who lives alone She reports having minimal support system. She does have a daughter and grand daughter. She has a niece that will be graduating from medical school (pediatric oncology) she consults with. She has a church member to assist with food. She grew up in [place], [place] schoolandcompletedlegal secretary trainingShestarted paralegal training butdid notfinished due to medical issues.She is a Patent examiner. She still has a good relationship with her ex husband and ex mother in law.She reports being independent with her care needs She reports having home health and other care servicespreviouslybut does not prefer any at this time. She reports being able to complete the exercises taught to her by her previous physical therapist. She denies issues with transportation to medical appointments. She reports her Church member visits to assist her with her medication containers. She is active in her church and ushers. She reports phone calls from her pastor and his wife.   Conditions:Hypertension (HTN),coronary artery disease,Chronic Kidney disease (CKD),critical limb ischemia status post femoropopliteal bypass, diabetes mellitus, cerebrovascular accident (CVA)x 3, Irritable bowel syndrome (IBS),Hyperlipidemia (HLD), Peripheral arterial disease, hx of asthma, food allergy,GERD (gastroesophageal reflux disease), diverticulosis of colon, pancreatitis, former smoker former substance abuse (crack- quit  07/27/1994")  NOI:BBCW, Shower seat, hand held shower head   [facility] RN CM  scheduled patient for case closure in 2  business days  for unable to maintain contact    [place]. Lavina Hamman, RN, BSN, [place] Coordinator Office number [phone] Mobile number [phone]  Main [name] number [phone] Fax number [phone]

## 2019-10-14 ENCOUNTER — Other Ambulatory Visit: Payer: Self-pay

## 2019-10-14 ENCOUNTER — Ambulatory Visit (HOSPITAL_COMMUNITY)
Admission: RE | Admit: 2019-10-14 | Discharge: 2019-10-14 | Disposition: A | Discharge status: Home / Self Care | Payer: Medicare Other | Source: Ambulatory Visit | Attending: Internal Medicine | Admitting: Internal Medicine

## 2019-10-14 ENCOUNTER — Other Ambulatory Visit: Payer: Self-pay | Admitting: Cardiovascular Disease

## 2019-10-14 DIAGNOSIS — I739 Peripheral vascular disease, unspecified: Secondary | ICD-10-CM

## 2019-10-14 DIAGNOSIS — Z95828 Presence of other vascular implants and grafts: Secondary | ICD-10-CM | POA: Insufficient documentation

## 2019-10-14 DIAGNOSIS — E1165 Type 2 diabetes mellitus with hyperglycemia: Secondary | ICD-10-CM | POA: Diagnosis not present

## 2019-10-14 DIAGNOSIS — Z794 Long term (current) use of insulin: Secondary | ICD-10-CM | POA: Diagnosis not present

## 2019-11-15 ENCOUNTER — Telehealth: Payer: Self-pay | Admitting: Cardiovascular Disease

## 2019-11-15 ENCOUNTER — Telehealth: Payer: Self-pay | Admitting: Internal Medicine

## 2019-11-15 DIAGNOSIS — R197 Diarrhea, unspecified: Secondary | ICD-10-CM

## 2019-11-15 NOTE — Telephone Encounter (Signed)
I attempted to get patient scheduled for f/u, the patient stated they would call back to get that appt scheduled.

## 2019-11-16 ENCOUNTER — Other Ambulatory Visit: Payer: Self-pay

## 2019-11-16 ENCOUNTER — Ambulatory Visit
Admission: RE | Admit: 2019-11-16 | Discharge: 2019-11-16 | Disposition: A | Discharge status: Home / Self Care | Payer: Medicare Other | Source: Ambulatory Visit | Attending: Internal Medicine | Admitting: Internal Medicine

## 2019-11-16 ENCOUNTER — Ambulatory Visit (INDEPENDENT_AMBULATORY_CARE_PROVIDER_SITE_OTHER)
Admission: RE | Admit: 2019-11-16 | Discharge: 2019-11-16 | Disposition: A | Discharge status: Home / Self Care | Payer: Medicare Other | Source: Ambulatory Visit | Attending: Internal Medicine | Admitting: Internal Medicine

## 2019-11-16 DIAGNOSIS — R197 Diarrhea, unspecified: Secondary | ICD-10-CM

## 2019-11-16 DIAGNOSIS — R112 Nausea with vomiting, unspecified: Secondary | ICD-10-CM | POA: Diagnosis not present

## 2019-11-16 DIAGNOSIS — R109 Unspecified abdominal pain: Secondary | ICD-10-CM | POA: Diagnosis not present

## 2019-11-16 NOTE — Telephone Encounter (Signed)
She needs a 2 view abdominal film re: diarrhea and constipation both     ? If she has an impaction   I purged her in April - did that help?

## 2019-11-16 NOTE — Telephone Encounter (Signed)
Patient notified She will come for x-ray now.  Yes purge helped.

## 2019-11-16 NOTE — Telephone Encounter (Signed)
Patient reports 2-3 weeks of multiple BMs a day.  "they are liquid and they are hard".  She reports that in one BM she can have liquid and solid stool in the small bowel movement. She is moving her bowels 2-3 times a day.  She is having incontinence at night. She stopped her amitiza "some time ago".  She is c/o nausea and urgency.  No new medications, no recent travel or sick contacts.  She is having nausea and cramping.  No bleeding. She has been taking her zofran as needed.  She has been missing work.  "I go from the bed to the bathroom"

## 2019-11-17 ENCOUNTER — Other Ambulatory Visit: Payer: Self-pay

## 2019-11-17 DIAGNOSIS — K5900 Constipation, unspecified: Secondary | ICD-10-CM

## 2019-11-17 MED ORDER — LUBIPROSTONE 24 MCG PO CAPS: 24.0000 ug | ORAL_CAPSULE | Freq: Two times a day (BID) | ORAL | 0 refills | Status: DC

## 2019-11-23 DIAGNOSIS — E1149 Type 2 diabetes mellitus with other diabetic neurological complication: Secondary | ICD-10-CM | POA: Diagnosis not present

## 2019-11-23 DIAGNOSIS — R21 Rash and other nonspecific skin eruption: Secondary | ICD-10-CM | POA: Diagnosis not present

## 2019-11-23 DIAGNOSIS — Z0001 Encounter for general adult medical examination with abnormal findings: Secondary | ICD-10-CM | POA: Diagnosis not present

## 2019-11-23 DIAGNOSIS — K922 Gastrointestinal hemorrhage, unspecified: Secondary | ICD-10-CM | POA: Diagnosis not present

## 2019-11-23 DIAGNOSIS — T783XXA Angioneurotic edema, initial encounter: Secondary | ICD-10-CM | POA: Diagnosis not present

## 2019-11-23 DIAGNOSIS — R000 Tachycardia, unspecified: Secondary | ICD-10-CM | POA: Diagnosis not present

## 2019-11-23 DIAGNOSIS — N1832 Chronic kidney disease, stage 3b: Secondary | ICD-10-CM | POA: Diagnosis not present

## 2019-11-26 DIAGNOSIS — E1140 Type 2 diabetes mellitus with diabetic neuropathy, unspecified: Secondary | ICD-10-CM | POA: Diagnosis not present

## 2019-11-26 DIAGNOSIS — G4700 Insomnia, unspecified: Secondary | ICD-10-CM | POA: Diagnosis not present

## 2019-11-26 DIAGNOSIS — E1165 Type 2 diabetes mellitus with hyperglycemia: Secondary | ICD-10-CM | POA: Diagnosis not present

## 2019-11-26 DIAGNOSIS — I739 Peripheral vascular disease, unspecified: Secondary | ICD-10-CM | POA: Diagnosis not present

## 2019-11-26 DIAGNOSIS — I10 Essential (primary) hypertension: Secondary | ICD-10-CM | POA: Diagnosis not present

## 2019-12-03 ENCOUNTER — Telehealth: Payer: Self-pay | Admitting: Internal Medicine

## 2019-12-03 NOTE — Telephone Encounter (Signed)
Pt is requesting a call back from a nurse in regards to her upcoming appt with Dr Carlean Purl

## 2019-12-03 NOTE — Telephone Encounter (Signed)
Patient with continued alternating bowel habits.  Has urgency and is very uncomfortable.  She will come in and see Nicoletta Ba PA on 12/06/19 8:30

## 2019-12-06 ENCOUNTER — Ambulatory Visit: Payer: Medicare Other | Admitting: Physician Assistant

## 2019-12-10 ENCOUNTER — Other Ambulatory Visit: Payer: Self-pay | Admitting: Internal Medicine

## 2019-12-10 DIAGNOSIS — K5900 Constipation, unspecified: Secondary | ICD-10-CM

## 2019-12-14 ENCOUNTER — Encounter: Payer: Self-pay | Admitting: Gastroenterology

## 2019-12-14 ENCOUNTER — Ambulatory Visit: Payer: Medicare Other | Admitting: Gastroenterology

## 2019-12-14 VITALS — BP 130/60 | HR 76 | Ht 63.0 in | Wt 126.1 lb

## 2019-12-14 DIAGNOSIS — R1084 Generalized abdominal pain: Secondary | ICD-10-CM | POA: Diagnosis not present

## 2019-12-14 DIAGNOSIS — K580 Irritable bowel syndrome with diarrhea: Secondary | ICD-10-CM

## 2019-12-14 MED ORDER — RIFAXIMIN 550 MG PO TABS
550.0000 mg | ORAL_TABLET | Freq: Three times a day (TID) | ORAL | 0 refills | Status: DC
Start: 2019-12-14 — End: 2019-12-16

## 2019-12-14 NOTE — Patient Instructions (Addendum)
If you are age 65 or older, your body mass index should be between 23-30. Your Body mass index is 22.34 kg/m. If this is out of the aforementioned range listed, please consider follow up with your Primary Care Provider.  If you are age 10 or younger, your body mass index should be between 19-25. Your Body mass index is 22.34 kg/m. If this is out of the aformentioned range listed, please consider follow up with your Primary Care Provider.   We have sent your demographic information and a prescription for Xifaxan to [facility]. This pharmacy is able to get medication approved through insurance and get you the lowest copay possible. If you have not heard from them within 1 week, please call our office at [phone] to let us know.  Follow up with Dr. Carlean Purl on 01/06/20.

## 2019-12-14 NOTE — Progress Notes (Signed)
12/14/2019 [name] [id number] 21-Aug-1955   HISTORY OF PRESENT ILLNESS: This is a pleasant 64 year old female who is well-known to Dr. Carlean Purl for treatment of her IBS.  She was last seen by him in April of this year.  She complains of issues with alternating constipation and diarrhea.  He thought that she was having constipation with overflow diarrhea after having an x-ray that showed a moderate amount of stool throughout the colon.  She has done multiple bowel purges and then was placed on Amitiza.  This is initially 8 mcg twice a day and now is on 24 mcg twice daily.  She says that even with that regimen sometimes she will have a bowel movement for 3 days.  She says then when she does have a bowel movement it is usually initially hard stool, but then she ends up with multiple episodes of diarrhea thereafter.  She complains of constant generalized abdominal pain and cramping that she says sometimes is so bad she does not want to get out of the bed.  She complains of feeling nauseated and weak and just sick on her stomach.  She says that she is also currently taking Metamucil, 1 packet daily.  She says that all of these symptoms have really become more prevalent since February, she actually says Valentine's Day.  Her blood sugars have not been well controlled recently ranging between 300 and 400 often.  Recent labs including a CBC and CMP look good except for the glucose level.   Just had EGD and colonoscopy in February of this year by Dr. Laural Golden while hospitalized at [facility].  Neither of these were contributory for any issues should be causing her symptoms.  Past Medical History:  Diagnosis Date  . 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 Anemia    hx  . Angioedema 07/10/2015  . Anxiety   . Arthritis    "neck, left hand" (09/14/2012)  . Asthma   . Cancer ([place]) 1985   ovarian, no treatment except surgery  . Complication of anesthesia    OCCASIONAL TROUBLE TURNING NECK TO RIGHT  . Critical  lower limb ischemia    10/2014 s/p L SFA stenting  . Diverticulosis of colon with hemorrhage April 2013  . GERD (gastroesophageal reflux disease)   . GI bleed   . H/O hiatal hernia   . Heart attack [facility])    2003 mild MI, March 2013 mild MI  . Hidradenitis    groin  . History of blood transfusion 1985 AND 2013  . Hyperlipidemia   . Hypertension   . Irritable bowel syndrome   . Left-sided weakness    since stroke, left eye trouble seeing  . Migraines   . Mild CAD    a. Cath 09/2010: mild luminal irregularities of LAD, 30% prox RCA and 20-30% mRCA, EF 65%.  . Neuropathy   . Obesity   . PAD (peripheral artery disease) ([place])    a. critical limb ischemia s/p PTA/stenting of L SFA 10/2014. c. occ prior SFA stent by angio 01/2016, for possible PV bypass.  . Pneumonia    baby  . Recurrent upper respiratory infection (URI)   . S/P arterial stent-mid Lt SFA 11/03/14 11/04/2014  . Schatzki's ring   . Sinus problem   . Stomach ulcer 1972   non-bleeding  . Stroke ([place]) 07-2007, 07-2008, 07-2009   total 3 strokes; mild left sided weakness and left eye "jumps".  . Type II diabetes mellitus ([place])   .  Vitamin B 12 deficiency 07-15-2013   Past Surgical History:  Procedure Laterality Date  . ABDOMINAL HYSTERECTOMY  1985  . ADENOIDECTOMY    . ANTERIOR CERVICAL DECOMP/DISCECTOMY FUSION  2002  . ANTERIOR CERVICAL DECOMP/DISCECTOMY FUSION N/A 04/08/2014   Procedure: Cervical Six-Seven ANTERIOR CERVICAL DECOMPRESSION/DISCECTOMY FUSION Plating and Bonegraft  2 LEVELS;  Surgeon: Ashok Pall, MD;  Location: [place] [facility]URO ORS;  Service: Neurosurgery;  Laterality: N/A;  Cervical Six-Seven ANTERIOR CERVICAL DECOMPRESSION/DISCECTOMY FUSION Plating and Bonegraft  2 LEVELS  . APPENDECTOMY  1985  . AXILLARY HIDRADENITIS EXCISION  1990-2008   bilateral  . BACK SURGERY    . BREAST BIOPSY Right 2007  . BREAST CYST EXCISION Right 2008  . BREAST REDUCTION SURGERY    . CARDIAC CATHETERIZATION  2004   mild disease  .  CATARACT EXTRACTION W/PHACO Left 05/16/2015   Procedure: CATARACT EXTRACTION PHACO AND INTRAOCULAR LENS PLACEMENT (IOC);  Surgeon: Rutherford Guys, MD;  [facility]: [facility];  Service: Ophthalmology;  Laterality: Left;  CDE: 4.24  . CATARACT EXTRACTION W/PHACO Right 05/30/2015   Procedure: CATARACT EXTRACTION RIGHT EYE PHACO AND INTRAOCULAR LENS PLACEMENT ;  Surgeon: Rutherford Guys, MD;  Location: [facility];  Service: Ophthalmology;  Laterality: Right;  CDE:4.08  . CHOLECYSTECTOMY  1990's  . COLONOSCOPY  08/12/2011   Procedure: COLONOSCOPY;  Surgeon: Ladene Artist, MD,FACG;  Location: [facility] ENDOSCOPY;  Service: Endoscopy;  Laterality: N/A;  . COLONOSCOPY  06/19/2006  . COLONOSCOPY WITH PROPOFOL N/A 07/02/2019   Procedure: COLONOSCOPY WITH PROPOFOL;  Surgeon: Rogene Houston, MD;  [facility];  Service: Endoscopy;  Laterality: N/A;  . cyst thigh Right   . ESOPHAGOGASTRODUODENOSCOPY  08/12/2011   Procedure: ESOPHAGOGASTRODUODENOSCOPY (EGD);  Surgeon: Ladene Artist, MD,FACG;  Location: [facility] ENDOSCOPY;  Service: Endoscopy;  Laterality: N/A;  . ESOPHAGOGASTRODUODENOSCOPY  06/04/2005  . ESOPHAGOGASTRODUODENOSCOPY (EGD) WITH PROPOFOL N/A 07/01/2019   Procedure: ESOPHAGOGASTRODUODENOSCOPY (EGD) WITH PROPOFOL;  Surgeon: Rogene Houston, MD;  [facility];  Service: Endoscopy;  Laterality: N/A;  . FEMORAL-POPLITEAL BYPASS GRAFT Left 06/19/2016   Procedure: Left Leg BYPASS GRAFT FEMORAL-POPLITEAL ARTERY;  Surgeon: Serafina Mitchell, MD;  Location: [place];  Service: Vascular;  Laterality: Left;  . GIVENS CAPSULE STUDY  08/13/2011   Procedure: GIVENS CAPSULE STUDY;  Surgeon: Ladene Artist, MD,FACG;  Location: [facility] ENDOSCOPY;  Service: Endoscopy;  Laterality: N/A;  . HAMMER TOE SURGERY Bilateral ~ 2000  . HEMOSTASIS CLIP PLACEMENT  07/02/2019   Procedure: HEMOSTASIS CLIP PLACEMENT;  Surgeon: Rogene Houston, MD;  [facility];  Service: Endoscopy;;  hepatic flexure  . HIATAL HERNIA REPAIR    .  HYDRADENITIS EXCISION  01/2011; 03/2012   'groin and abdomen; 03/2012" (09/14/2012)  . HYDRADENITIS EXCISION  04/01/2012   Procedure: EXCISION HYDRADENITIS GROIN;  Surgeon: Pedro Earls, MD;  Location: [facility];  Service: General;  Laterality: Bilateral;  Excision of Hydradenitis of Perineum  . HYDRADENITIS EXCISION N/A 09/17/2013   Procedure: EXCISION PERINEAL HIDRADENITIS ;  Surgeon: Pedro Earls, MD;  Location: [facility];  Service: General;  Laterality: N/A;  also in the pubis area  . LEFT HEART CATH AND CORONARY ANGIOGRAPHY N/A 12/26/2016   Procedure: LEFT HEART CATH AND CORONARY ANGIOGRAPHY;  Surgeon: [name], Peter M, MD;  Location: [place] CV LAB;  Service: Cardiovascular;  Laterality: N/A;  . MASS EXCISION Right 09/17/2013   Procedure: EXCISION MASS;  Surgeon: Pedro Earls, MD;  Location: [facility] ORS;  Service: General;  Laterality: Right;  .  NISSEN FUNDOPLICATION  0254  . PERIPHERAL VASCULAR CATHETERIZATION N/A 11/03/2014   Procedure: Lower Extremity Angiography;  Surgeon: Lorretta Harp, MD;  Location: [place] CV LAB;  Service: Cardiovascular;  Laterality: N/A;  . PERIPHERAL VASCULAR CATHETERIZATION N/A 01/29/2016   Procedure: Lower Extremity Angiography;  Surgeon: Lorretta Harp, MD;  Location: [place] CV LAB;  Service: Cardiovascular;  Laterality: N/A;  . POLYPECTOMY  07/02/2019   Procedure: POLYPECTOMY;  Surgeon: Rogene Houston, MD;  [facility];  Service: Endoscopy;;  . POSTERIOR LUMBAR FUSION  2008 X 2  . REDUCTION MAMMAPLASTY  1996?  . TONSILLECTOMY AND ADENOIDECTOMY  1959 AND 2000  . UVULOPALATOPHARYNGOPLASTY, TONSILLECTOMY AND SEPTOPLASTY  2000's    reports that she quit smoking about 3 years ago. Her smoking use included cigarettes. She has a 20.50 pack-year smoking history. She has never used smokeless tobacco. She reports that she does not drink alcohol and does not use drugs. family history includes Allergic rhinitis in her sister; Aneurysm in her  sister; Breast cancer in her mother; Diabetes in her father and sister; Emphysema in an other family member; Heart disease in her father, mother, and sister; Hyperlipidemia in her sister; Hypertension in her mother, sister, and sister; Stroke in her father. Allergies  Allergen Reactions  . Sulfa Antibiotics Shortness Of Breath and Palpitations  . 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 Fish Allergy Hives and Swelling    Tongue swelling  . Iodine Swelling  . Metformin And Related Diarrhea  . [name] [Insulin Glargine-Lixisenatide] Itching and Other (See Comments)    "tongue swelling"  . Adhesive [Tape] Rash    Paper tape is ok  . Shellfish Allergy Swelling and Rash    Tongue swelling      Outpatient Encounter Medications as of 12/14/2019  Medication Sig  . acetaminophen (TYLENOL) 325 MG tablet Take 2 tablets (650 mg total) by mouth every 6 (six) hours as needed for mild pain (or Fever >/= 101).  Marland Kitchen albuterol (PROAIR HFA) 108 (90 Base) MCG/ACT inhaler 1-2 inhalations every 4-6 hours as needed for cough or wheeze. (Patient taking differently: Inhale 1-2 puffs into the lungs every 4 (four) hours as needed for shortness of breath. )  . ALPRAZolam (XANAX) 0.5 MG tablet Take 0.5 mg by mouth 2 (two) times daily as needed for anxiety.  . Azelastine HCl 0.15 % SOLN Place 2 sprays into both nostrils 2 (two) times daily. (Patient taking differently: Place 2 sprays into both nostrils 2 (two) times daily as needed (allergies). )  . citalopram (CELEXA) 10 MG tablet Take 10 mg by mouth daily as needed.   . clopidogrel (PLAVIX) 75 MG tablet Take 1 tablet (75 mg total) by mouth daily.  Marland Kitchen EPINEPHrine 0.3 mg/0.3 mL IJ SOAJ injection Inject 0.3 mLs (0.3 mg total) into the muscle once. (Patient taking differently: Inject 0.3 mg into the muscle as needed for anaphylaxis. )  . Fluocinolone Acetonide Body 0.01 % OIL Apply topically 2 (two) times daily.  Marland Kitchen  HYDROcodone-acetaminophen (NORCO) 7.5-325 MG tablet Take 1 tablet by mouth daily as needed for moderate pain.   Marland Kitchen insulin lispro (HUMALOG) 100 UNIT/ML injection Inject 5-12 Units into the skin 2 (two) times daily before a meal. Per sliding scale using OMNIPOD  . loperamide (IMODIUM) 2 MG capsule Take 1 capsule (2 mg total) by mouth as needed for diarrhea or loose stools.  Marland Kitchen losartan-hydrochlorothiazide (HYZAAR) 50-12.5 MG tablet Take  1 tablet by mouth daily.  Marland Kitchen lubiprostone (AMITIZA) 24 MCG capsule TAKE 1 CAPSULE (24 MCG TOTAL) BY MOUTH 2 (TWO) TIMES DAILY WITH A MEAL.  . Magnesium 200 MG TABS Take 1 tablet (200 mg total) by mouth daily.  . metoCLOPramide (REGLAN) 5 MG tablet Take 1 tablet (5 mg total) by mouth every 6 (six) hours as needed for nausea or vomiting.  . mupirocin ointment (BACTROBAN) 2 % Apply 1 application topically 2 (two) times daily. (Patient taking differently: Apply 1 application topically 2 (two) times daily as needed. )  . nitroGLYCERIN (NITROSTAT) 0.4 MG SL tablet PLACE 1 TAB UNDER TONGUE EVERY 5 MINS AS NEEDED FOR CHEST PAIN - MAX 3 DOSES THEN 911 (Patient taking differently: Place 0.4 mg under the tongue every 5 (five) minutes as needed for chest pain. MAX 3 DOSES THEN 911)  . ondansetron (ZOFRAN) 4 MG tablet Take 1 tablet (4 mg total) by mouth daily as needed for nausea or vomiting.  . pantoprazole (PROTONIX) 40 MG tablet TAKE 1 TABLET BY MOUTH EVERY DAY (Patient taking differently: Take 40 mg by mouth daily. )  . propranolol ER (INDERAL LA) 60 MG 24 hr capsule Take 1 capsule (60 mg total) by mouth every evening.  . rosuvastatin (CRESTOR) 40 MG tablet Take 1 tablet (40 mg total) by mouth every morning.  . SUMAtriptan (IMITREX) 100 MG tablet Take 100 mg by mouth every 2 (two) hours as needed for migraine.   . temazepam (RESTORIL) 30 MG capsule Take 30 mg by mouth at bedtime as needed for sleep.  . traZODone (DESYREL) 100 MG tablet Take 1 tablet (100 mg total) by mouth at  bedtime.  . TRESIBA 100 UNIT/ML SOLN SMARTSIG:35 Unit(s) SUB-Q Daily  . potassium chloride 20 MEQ TBCR Take 10 mEq by mouth 2 (two) times daily.  . [DISCONTINUED] cefUROXime (CEFTIN) 500 MG tablet Take 500 mg by mouth 2 (two) times daily.  . [DISCONTINUED] ciprofloxacin (CIPRO) 500 MG tablet Take 500 mg by mouth 2 (two) times daily.  . [DISCONTINUED] lubiprostone (AMITIZA) 8 MCG capsule Take 1 capsule (8 mcg total) by mouth 2 (two) times daily with a meal.  . [DISCONTINUED] methylPREDNISolone (MEDROL DOSEPAK) 4 MG TBPK tablet    No facility-administered encounter medications on file as of 12/14/2019.     REVIEW OF SYSTEMS  : All other systems reviewed and negative except where noted in the History of Present Illness.   PHYSICAL EXAM: Ht 5\' 3"  (1.6 m) Comment: height measured without shoes  Wt 126 lb 2 oz (57.2 kg)   BMI 22.34 kg/m  General: Well developed AA female in no acute distress Head: Normocephalic and atraumatic Eyes:  Sclerae anicteric, conjunctiva pink. Ears: Normal auditory acuity Lungs: Clear throughout to auscultation; no W/R/R. Heart: Regular rate and rhythm; no M/R/G. Abdomen: Soft, non-distended.  BS present.  Generalized TTP. Musculoskeletal: Symmetrical with no gross deformities  Skin: No lesions on visible extremities Extremities: No edema  Neurological: Alert oriented x 4, grossly non-focal Psychological:  Alert and cooperative. Normal mood and affect  ASSESSMENT AND PLAN: *64 year old female well-known to Dr. Carlean Purl with history of IBS.  Reports alternating bowel habits between constipation and diarrhea.  Initially thought the primary issue was constipation with overflow diarrhea.  She feels that diarrhea is a primary issue.  Reports a lot of abdominal pain and cramping, generalized.  She is diabetic.  She is at higher risk for small intestinal bacterial overgrowth.  We will try to empirically treat her with  Xifaxan 550 mg 3 times daily for 14 days to see if  this helps.  Otherwise for now she will continue her other medications as she is currently taking them including her Amitiza 24 mcg twice daily.  I discussed this with Dr. Carlean Purl he is in agreement.  He has asked that I schedule her to see him back at his next available visit.   CC:  [name], MD

## 2019-12-15 ENCOUNTER — Encounter: Payer: Self-pay | Admitting: Gastroenterology

## 2019-12-15 DIAGNOSIS — R1084 Generalized abdominal pain: Secondary | ICD-10-CM | POA: Insufficient documentation

## 2019-12-16 ENCOUNTER — Telehealth: Payer: Self-pay | Admitting: Gastroenterology

## 2019-12-16 ENCOUNTER — Ambulatory Visit: Payer: Medicare Other | Admitting: Physician Assistant

## 2019-12-16 MED ORDER — RIFAXIMIN 550 MG PO TABS: 550.0000 mg | ORAL_TABLET | Freq: Three times a day (TID) | ORAL | 0 refills | Status: AC

## 2019-12-16 NOTE — Telephone Encounter (Signed)
Spoke with patient informed her that it could take [facility] up to a week to get medication approved. If she has not heard from them by next Tuesday to give me a call back. Patient voiced understanding.

## 2019-12-16 NOTE — Telephone Encounter (Signed)
Sent script for Xifaxan to local pharmacy per patient request.

## 2019-12-17 ENCOUNTER — Telehealth: Payer: Self-pay | Admitting: *Deleted

## 2019-12-17 NOTE — Telephone Encounter (Signed)
PA submitted via Covermymeds for Xifaxan. 

## 2019-12-27 DIAGNOSIS — E119 Type 2 diabetes mellitus without complications: Secondary | ICD-10-CM | POA: Diagnosis not present

## 2019-12-27 DIAGNOSIS — Z794 Long term (current) use of insulin: Secondary | ICD-10-CM | POA: Diagnosis not present

## 2019-12-27 DIAGNOSIS — H524 Presbyopia: Secondary | ICD-10-CM | POA: Diagnosis not present

## 2019-12-27 DIAGNOSIS — H5213 Myopia, bilateral: Secondary | ICD-10-CM | POA: Diagnosis not present

## 2019-12-27 DIAGNOSIS — H52203 Unspecified astigmatism, bilateral: Secondary | ICD-10-CM | POA: Diagnosis not present

## 2020-01-06 ENCOUNTER — Ambulatory Visit (INDEPENDENT_AMBULATORY_CARE_PROVIDER_SITE_OTHER): Payer: Medicare Other | Admitting: Internal Medicine

## 2020-01-06 ENCOUNTER — Encounter: Payer: Self-pay | Admitting: Internal Medicine

## 2020-01-06 VITALS — BP 144/86 | HR 80 | Ht 63.0 in | Wt 128.5 lb

## 2020-01-06 DIAGNOSIS — E1143 Type 2 diabetes mellitus with diabetic autonomic (poly)neuropathy: Secondary | ICD-10-CM

## 2020-01-06 DIAGNOSIS — K6289 Other specified diseases of anus and rectum: Secondary | ICD-10-CM

## 2020-01-06 DIAGNOSIS — K582 Mixed irritable bowel syndrome: Secondary | ICD-10-CM | POA: Diagnosis not present

## 2020-01-06 DIAGNOSIS — K3184 Gastroparesis: Secondary | ICD-10-CM | POA: Diagnosis not present

## 2020-01-06 NOTE — Progress Notes (Signed)
[name] 64 y.o. [birth year] [id number]  Assessment & Plan:   Encounter Diagnoses  Name Primary?  . Irritable bowel syndrome with both constipation and diarrhea Yes  . Decreased anal sphincter tone   . Diabetic gastroparesis associated with type 2 diabetes mellitus ([place])     Complicated situation.  Fortunately gastroparesis does not seem to be too much of a problem lately.  She is having worsening control of her diabetes which I am sure is influencing things.  Xifaxan was tried reasonable did not seem to help.  She is at risk for small intestinal bacterial overgrowth.  Perhaps it was undertreated and could consider repeat treatment in the future.  Also could consider small intestinal bacterial overgrowth testing.  Anorectal function does not appear to be normal based upon my functional rectal exam today.  I do not think we could alleviate all of her symptoms but in terms of pharmacologic therapy and what I have done so far I do not have a whole lot of other options between what is possible just by what exists and what would be affordable.  I.e. using other medications like Motegrity which in theory would help her because it stimulates total gut motility but is not on her formulary and she is not eligible for a discount card because she is on Medicare.   I am referring her for pelvic floor physical therapy at this time to see what might come out of that.  I think she will benefit from that the question is how much.  I have encouraged her to follow-up closely with primary care as she is regarding her hyperglycemia and type 2 diabetes mellitus that is not controlled.  Return in about 6 to 8 weeks  I appreciate the opportunity to care for this patient. CC: [name], MD     Subjective:   Chief Complaint:  HPI [name] is here for follow-up, she has diabetic gastroparesis, irritable bowel syndrome and probable diabetic neuropathy issues with both constipation and diarrhea.  She  was seen by Alonza Bogus last month.  Assessment and plan as below.  ASSESSMENT AND PLAN: *64 year old female well-known to Dr. Carlean Purl with history of IBS.  Reports alternating bowel habits between constipation and diarrhea.  Initially thought the primary issue was constipation with overflow diarrhea.  She feels that diarrhea is a primary issue.  Reports a lot of abdominal pain and cramping, generalized.  She is diabetic.  She is at higher risk for small intestinal bacterial overgrowth.  We will try to empirically treat her with Xifaxan 550 mg 3 times daily for 14 days to see if this helps.  Otherwise for now she will continue her other medications as she is currently taking them including her Amitiza 24 mcg twice daily.  I discussed this with Dr. Carlean Purl he is in agreement.  He has asked that I schedule her to see him back at his next available visit.  She does not think that the Xifaxan made much difference.  She will move her bowels about 2 days out of the week that are generally formed in the beginning and then more progressively loose and often urgent and she has had some fecal incontinence.  Does not sound like she uses loperamide very often though it is difficult to pin her down on that.  Sometimes she awakens early in the morning with diarrhea at about 4 or 5 in the morning.  Weight is going up a little bit.  Blood sugar as  high as 448 recently and hemoglobin A1c was either 10 or 11% by self-report.  Insulin dose is being increased.  She believes she never had problems with constipation until she had a hysterectomy in the 1980s.  There is very mild stress urinary incontinence symptoms but otherwise no genitourinary tract symptoms.  She is not sure how much if any Amitiza helps at this point.  Wt Readings from Last 3 Encounters:  01/06/20 128 lb 8 oz (58.3 kg)  12/14/19 126 lb 2 oz (57.2 kg)  09/24/19 121 lb 9.6 oz (55.2 kg)     Allergies  Allergen Reactions  . Sulfa Antibiotics  Shortness Of Breath and Palpitations  . 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 Fish Allergy Hives and Swelling    Tongue swelling  . Iodine Swelling  . Metformin And Related Diarrhea  . [name] [Insulin Glargine-Lixisenatide] Itching and Other (See Comments)    "tongue swelling"  . Adhesive [Tape] Rash    Paper tape is ok  . Shellfish Allergy Swelling and Rash    Tongue swelling   Current Meds  Medication Sig  . acetaminophen (TYLENOL) 325 MG tablet Take 2 tablets (650 mg total) by mouth every 6 (six) hours as needed for mild pain (or Fever >/= 101).  Marland Kitchen albuterol (PROAIR HFA) 108 (90 Base) MCG/ACT inhaler 1-2 inhalations every 4-6 hours as needed for cough or wheeze. (Patient taking differently: Inhale 1-2 puffs into the lungs every 4 (four) hours as needed for shortness of breath. )  . ALPRAZolam (XANAX) 0.5 MG tablet Take 0.5 mg by mouth 2 (two) times daily as needed for anxiety.  . Azelastine HCl 0.15 % SOLN Place 2 sprays into both nostrils 2 (two) times daily. (Patient taking differently: Place 2 sprays into both nostrils 2 (two) times daily as needed (allergies). )  . citalopram (CELEXA) 10 MG tablet Take 10 mg by mouth daily as needed.   . clopidogrel (PLAVIX) 75 MG tablet Take 1 tablet (75 mg total) by mouth daily.  Marland Kitchen doxycycline (ADOXA) 100 MG tablet Take 100 mg by mouth 2 (two) times daily as needed.  Marland Kitchen EPINEPHrine 0.3 mg/0.3 mL IJ SOAJ injection Inject 0.3 mLs (0.3 mg total) into the muscle once. (Patient taking differently: Inject 0.3 mg into the muscle as needed for anaphylaxis. )  . Fluocinolone Acetonide Body 0.01 % OIL Apply topically 2 (two) times daily.  Marland Kitchen HYDROcodone-acetaminophen (NORCO) 7.5-325 MG tablet Take 1 tablet by mouth daily as needed for moderate pain.   Marland Kitchen insulin lispro (HUMALOG) 100 UNIT/ML injection Inject 5-12 Units into the skin 2 (two) times daily before a meal. Per sliding scale using OMNIPOD  . loperamide  (IMODIUM) 2 MG capsule Take 1 capsule (2 mg total) by mouth as needed for diarrhea or loose stools.  Marland Kitchen losartan-hydrochlorothiazide (HYZAAR) 50-12.5 MG tablet Take 1 tablet by mouth daily.  Marland Kitchen lubiprostone (AMITIZA) 24 MCG capsule TAKE 1 CAPSULE (24 MCG TOTAL) BY MOUTH 2 (TWO) TIMES DAILY WITH A MEAL.  . Magnesium 200 MG TABS Take 1 tablet (200 mg total) by mouth daily.  . metoCLOPramide (REGLAN) 5 MG tablet Take 1 tablet (5 mg total) by mouth every 6 (six) hours as needed for nausea or vomiting.  . mupirocin ointment (BACTROBAN) 2 % Apply 1 application topically 2 (two) times daily. (Patient taking differently: Apply 1 application topically 2 (two) times daily as needed. )  . nitroGLYCERIN (NITROSTAT) 0.4 MG SL tablet  PLACE 1 TAB UNDER TONGUE EVERY 5 MINS AS NEEDED FOR CHEST PAIN - MAX 3 DOSES THEN 911 (Patient taking differently: Place 0.4 mg under the tongue every 5 (five) minutes as needed for chest pain. MAX 3 DOSES THEN 911)  . ondansetron (ZOFRAN) 4 MG tablet Take 1 tablet (4 mg total) by mouth daily as needed for nausea or vomiting.  . pantoprazole (PROTONIX) 40 MG tablet TAKE 1 TABLET BY MOUTH EVERY DAY (Patient taking differently: Take 40 mg by mouth daily. )  . propranolol ER (INDERAL LA) 60 MG 24 hr capsule Take 1 capsule (60 mg total) by mouth every evening.  . rifaximin (XIFAXAN) 550 MG TABS tablet TAKE 1 TABLET (550 MG TOTAL) BY MOUTH 3 (THREE) TIMES DAILY FOR 14 DAYS.  Marland Kitchen rosuvastatin (CRESTOR) 40 MG tablet Take 1 tablet (40 mg total) by mouth every morning.  . SUMAtriptan (IMITREX) 100 MG tablet Take 100 mg by mouth every 2 (two) hours as needed for migraine.   . temazepam (RESTORIL) 30 MG capsule Take 30 mg by mouth at bedtime as needed for sleep.  . traZODone (DESYREL) 100 MG tablet Take 1 tablet (100 mg total) by mouth at bedtime.  . TRESIBA 100 UNIT/ML SOLN SMARTSIG:35 Unit(s) SUB-Q Daily   Past Medical History:  Diagnosis Date  . 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 Anemia    hx    . Angioedema 07/10/2015  . Anxiety   . Arthritis    "neck, left hand" (09/14/2012)  . Asthma   . Cancer ([place]) 1985   ovarian, no treatment except surgery  . Complication of anesthesia    OCCASIONAL TROUBLE TURNING NECK TO RIGHT  . Critical lower limb ischemia    10/2014 s/p L SFA stenting  . Diverticulosis of colon with hemorrhage April 2013  . GERD (gastroesophageal reflux disease)   . GI bleed   . H/O hiatal hernia   . Heart attack [facility])    2003 mild MI, March 2013 mild MI  . Hidradenitis    groin  . History of blood transfusion 1985 AND 2013  . Hyperlipidemia   . Hypertension   . Irritable bowel syndrome   . Left-sided weakness    since stroke, left eye trouble seeing  . Migraines   . Mild CAD    a. Cath 09/2010: mild luminal irregularities of LAD, 30% prox RCA and 20-30% mRCA, EF 65%.  . Neuropathy   . Obesity   . PAD (peripheral artery disease) ([place])    a. critical limb ischemia s/p PTA/stenting of L SFA 10/2014. c. occ prior SFA stent by angio 01/2016, for possible PV bypass.  . Pneumonia    baby  . Recurrent upper respiratory infection (URI)   . S/P arterial stent-mid Lt SFA 11/03/14 11/04/2014  . Schatzki's ring   . Sinus problem   . Stomach ulcer 1972   non-bleeding  . Stroke ([place]) 07-2007, 07-2008, 07-2009   total 3 strokes; mild left sided weakness and left eye "jumps".  . Type II diabetes mellitus ([place])   . Vitamin B 12 deficiency 07-15-2013   Past Surgical History:  Procedure Laterality Date  . ABDOMINAL HYSTERECTOMY  1985  . ADENOIDECTOMY    . ANTERIOR CERVICAL DECOMP/DISCECTOMY FUSION  2002  . ANTERIOR CERVICAL DECOMP/DISCECTOMY FUSION N/A 04/08/2014   Procedure: Cervical Six-Seven ANTERIOR CERVICAL DECOMPRESSION/DISCECTOMY FUSION Plating and Bonegraft  2 LEVELS;  Surgeon: Ashok Pall, MD;  Location: [facility];  Service: Neurosurgery;  Laterality: N/A;  Cervical Six-Seven ANTERIOR CERVICAL  DECOMPRESSION/DISCECTOMY FUSION Plating and Bonegraft  2 LEVELS  .  APPENDECTOMY  1985  . AXILLARY HIDRADENITIS EXCISION  1990-2008   bilateral  . BACK SURGERY    . BREAST BIOPSY Right 2007  . BREAST CYST EXCISION Right 2008  . BREAST REDUCTION SURGERY    . CARDIAC CATHETERIZATION  2004   mild disease  . CATARACT EXTRACTION W/PHACO Left 05/16/2015   Procedure: CATARACT EXTRACTION PHACO AND INTRAOCULAR LENS PLACEMENT (IOC);  Surgeon: Rutherford Guys, MD;  Location: [facility];  Service: Ophthalmology;  Laterality: Left;  CDE: 4.24  . CATARACT EXTRACTION W/PHACO Right 05/30/2015   Procedure: CATARACT EXTRACTION RIGHT EYE PHACO AND INTRAOCULAR LENS PLACEMENT ;  Surgeon: Rutherford Guys, MD;  Location: [facility];  Service: Ophthalmology;  Laterality: Right;  CDE:4.08  . CHOLECYSTECTOMY  1990's  . COLONOSCOPY  08/12/2011   Procedure: COLONOSCOPY;  Surgeon: Ladene Artist, MD,FACG;  Location: [facility] ENDOSCOPY;  Service: Endoscopy;  Laterality: N/A;  . COLONOSCOPY  06/19/2006  . COLONOSCOPY WITH PROPOFOL N/A 07/02/2019   Procedure: COLONOSCOPY WITH PROPOFOL;  Surgeon: Rogene Houston, MD;  [facility];  Service: Endoscopy;  Laterality: N/A;  . cyst thigh Right   . ESOPHAGOGASTRODUODENOSCOPY  08/12/2011   Procedure: ESOPHAGOGASTRODUODENOSCOPY (EGD);  Surgeon: Ladene Artist, MD,FACG;  [facility] ENDOSCOPY;  Service: Endoscopy;  Laterality: N/A;  . ESOPHAGOGASTRODUODENOSCOPY  06/04/2005  . ESOPHAGOGASTRODUODENOSCOPY (EGD) WITH PROPOFOL N/A 07/01/2019   Procedure: ESOPHAGOGASTRODUODENOSCOPY (EGD) WITH PROPOFOL;  Surgeon: Rogene Houston, MD;  [facility];  Service: Endoscopy;  Laterality: N/A;  . FEMORAL-POPLITEAL BYPASS GRAFT Left 06/19/2016   Procedure: Left Leg BYPASS GRAFT FEMORAL-POPLITEAL ARTERY;  Surgeon: Serafina Mitchell, MD;  Location: [facility];  Service: Vascular;  Laterality: Left;  . GIVENS CAPSULE STUDY  08/13/2011   Procedure: GIVENS CAPSULE STUDY;  Surgeon: Ladene Artist, MD,FACG;  Location: [facility] ENDOSCOPY;  Service: Endoscopy;  Laterality: N/A;  .  HAMMER TOE SURGERY Bilateral ~ 2000  . HEMOSTASIS CLIP PLACEMENT  07/02/2019   Procedure: HEMOSTASIS CLIP PLACEMENT;  Surgeon: Rogene Houston, MD;  [facility];  Service: Endoscopy;;  hepatic flexure  . HIATAL HERNIA REPAIR    . HYDRADENITIS EXCISION  01/2011; 03/2012   'groin and abdomen; 03/2012" (09/14/2012)  . HYDRADENITIS EXCISION  04/01/2012   Procedure: EXCISION HYDRADENITIS GROIN;  Surgeon: Pedro Earls, MD;  Location: [facility];  Service: General;  Laterality: Bilateral;  Excision of Hydradenitis of Perineum  . HYDRADENITIS EXCISION N/A 09/17/2013   Procedure: EXCISION PERINEAL HIDRADENITIS ;  Surgeon: Pedro Earls, MD;  Location: [facility];  Service: General;  Laterality: N/A;  also in the pubis area  . LEFT HEART CATH AND CORONARY ANGIOGRAPHY N/A 12/26/2016   Procedure: LEFT HEART CATH AND CORONARY ANGIOGRAPHY;  Surgeon: [name], Peter M, MD;  Location: [facility] CV LAB;  Service: Cardiovascular;  Laterality: N/A;  . MASS EXCISION Right 09/17/2013   Procedure: EXCISION MASS;  Surgeon: Pedro Earls, MD;  Location: [facility];  Service: General;  Laterality: Right;  . NISSEN FUNDOPLICATION  8242  . PERIPHERAL VASCULAR CATHETERIZATION N/A 11/03/2014   Procedure: Lower Extremity Angiography;  Surgeon: Lorretta Harp, MD;  Location: [facility] CV LAB;  Service: Cardiovascular;  Laterality: N/A;  . PERIPHERAL VASCULAR CATHETERIZATION N/A 01/29/2016   Procedure: Lower Extremity Angiography;  Surgeon: Lorretta Harp, MD;  Location: [facility] CV LAB;  Service: Cardiovascular;  Laterality: N/A;  . POLYPECTOMY  07/02/2019   Procedure: POLYPECTOMY;  Surgeon:  Rogene Houston, MD;  [facility];  Service: Endoscopy;;  . POSTERIOR LUMBAR FUSION  2008 X 2  . REDUCTION MAMMAPLASTY  1996?  . TONSILLECTOMY AND ADENOIDECTOMY  1959 AND 2000  . 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  Divorced, living alone in [place].     family history includes Allergic rhinitis in her sister; Aneurysm in her sister; Breast cancer in her mother; Diabetes in her father and sister; Emphysema in an other family member; Heart disease in her father, mother, and sister; Hyperlipidemia in her sister; Hypertension in her mother, sister, and sister; Stroke in her father.   Review of Systems   Objective:   Physical Exam BP (!) 144/86 (BP Location: Left Arm, Patient Position: Sitting, Cuff Size: Normal)   Pulse 80   Ht 5\' 3"  (1.6 m)   Wt 128 lb 8 oz (58.3 kg)   BMI 22.76 kg/m  NAD  Patti [name], CMA present.  abd soft, decreased muscle tone mildly tender diffusely   Anoderm inspection revealed no abnormalities Anal wink was + Digital exam revealed decreased resting tone and voluntary squeeze. No mass or rectocele present. Simulated defecation with valsalva revealed reduced abdominal contraction almost no descentdescent.

## 2020-01-06 NOTE — Patient Instructions (Signed)
We are referring you for pelvic floor physical therapy. They will contact you to set up the appointment.   Make sure you follow up on your blood sugar to get better control of this.   Normal BMI (Body Mass Index- based on height and weight) is between 19 and 25. Your BMI today is Body mass index is 22.76 kg/m. Marland Kitchen Please consider follow up  regarding your BMI with your Primary Care Provider.   I appreciate the opportunity to care for you. Silvano Rusk, MD, [facility]

## 2020-01-12 DIAGNOSIS — G4700 Insomnia, unspecified: Secondary | ICD-10-CM | POA: Diagnosis not present

## 2020-01-12 DIAGNOSIS — E1165 Type 2 diabetes mellitus with hyperglycemia: Secondary | ICD-10-CM | POA: Diagnosis not present

## 2020-01-12 DIAGNOSIS — E1140 Type 2 diabetes mellitus with diabetic neuropathy, unspecified: Secondary | ICD-10-CM | POA: Diagnosis not present

## 2020-01-12 DIAGNOSIS — K219 Gastro-esophageal reflux disease without esophagitis: Secondary | ICD-10-CM | POA: Diagnosis not present

## 2020-01-12 DIAGNOSIS — I10 Essential (primary) hypertension: Secondary | ICD-10-CM | POA: Diagnosis not present

## 2020-01-13 ENCOUNTER — Other Ambulatory Visit (HOSPITAL_COMMUNITY): Payer: Self-pay | Admitting: Internal Medicine

## 2020-01-13 ENCOUNTER — Other Ambulatory Visit: Payer: Self-pay | Admitting: Internal Medicine

## 2020-01-13 DIAGNOSIS — R197 Diarrhea, unspecified: Secondary | ICD-10-CM

## 2020-01-13 DIAGNOSIS — Z794 Long term (current) use of insulin: Secondary | ICD-10-CM | POA: Diagnosis not present

## 2020-01-13 DIAGNOSIS — E1165 Type 2 diabetes mellitus with hyperglycemia: Secondary | ICD-10-CM | POA: Diagnosis not present

## 2020-01-13 DIAGNOSIS — R634 Abnormal weight loss: Secondary | ICD-10-CM

## 2020-01-14 ENCOUNTER — Other Ambulatory Visit: Payer: Self-pay

## 2020-01-14 ENCOUNTER — Other Ambulatory Visit: Payer: Self-pay | Admitting: *Deleted

## 2020-01-14 NOTE — Patient Outreach (Signed)
[facility] [facility]) Care Management  01/14/2020  ABAGAYLE KLUTTS Aug 08, 1955 [id number]   [name] outreach to [place] liaison referred patient Referral Date:08/02/19 Referral Source:Netta Cedars, [facility] hospital liaison Referral Reason:Post hospital follow up Diagnoses of Diabetes,CKD Please refer to community telephonic RN for care management services. Insurance:united healthcare medicare   Last admission 07/28/19 to 07/30/19  Outreach attemptsuccessful Patient is able to verify HIPAA ([place]) identifiers, date of birth (DOB) and address  Progression Mrs Thunder reports she continues to not progress well at home  She confirms she is continuing to have episodes of elevated cbgs, loss of weight and various stages of bowel elimination concerns from diarrhea to constipation This hs been occurring since 06/20/19  She reports being sick this week on Monday, Tuesday and Wednesday She reports a wt ranging from 119.5 to 128 During this outreach she is at 126.4 lb as Dr Carlean Purl is scheduling her for a CT of her abdomen on 02/04/20 with a follow  Up with him on 03/09/20  Discussed protein/supplement intake - clear ensure or premier  cbg values from 197 to 470  Dr [place] Crane increased her insulin on 01/12/20 to 40 units at night  She recalls a last HgA1c of 10-11   Her calcium and vitamin d have been discontinued   Socially she looking forward to her [birth year] birthday and spending time with her granddaughter out of town She reports the death this week of a female cousin who was supportive of her  Social:Mrs NASHLEY CORDOBA is a 64 year old disabled divorced female who lives alone She reports having minimal support system. She does have a daughter and grand daughter Monia Sabal). She has a niece that will be graduating from medical school (pediatric oncology) she consults with. She has a church member to assist with food. She grew up in [place],  [place] schoolandcompletedlegal secretary trainingShestarted paralegal training butdid notfinished due to medical issues.She is a Patent examiner presently. She still has a good relationship with her ex husband and ex mother in law.She reports being independent with her care needs She reports having home health and other care services previously but does not prefer any at this time. She reports being able to complete the exercises taught to her by her previous physical therapist. She denies issues with transportation to medical appointments. She reports her Church member visits to assist her with her medication containers. She is active in her church and ushers. She reports phone calls from her pastor and his wife.    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  [facility]) Patient will be able to manage her DM, HTN and GI medical issues at home (pt-stated)        [place] (see longitudinal plan of care for additional care plan information)  Objective:  Lab Results  Component Value Date  HGBA1C 9.1 (H) 06/28/2019 .   Lab Results  Component Value Date   CREATININE 1.18 (H) 07/30/2019   CREATININE 1.40 (H) 07/29/2019   CREATININE 1.84 (H) 07/28/2019 .   Marland Kitchen No results found for: EGFR  Current  Barriers:  Marland Kitchen Knowledge Deficits related to basic Diabetes and IBS pathophysiology and self care/management . Cognitive Deficits  Case Manager Clinical Goal(s):  Over the next 90 days, patient will demonstrate improved adherence to prescribed treatment plan for diabetes self care/management as evidenced by:  Marland Kitchen Verbalize adherence to ADA/ carb modified diet within the next 30 days . Patient will report less sick days within then next 45 business days during outreach  Interventions:  . Provided education to patient about basic DM disease process . Reviewed medications with patient and discussed importance of medication adherence . Discussed plans with patient for ongoing care management follow up and provided patient with direct contact information for care management team . Provided patient with written educational materials related to hypo and hyperglycemia and importance of correct treatment . Reviewed scheduled/upcoming provider appointments including: pcp gastroenterology  Patient Self Care Activities:  . Self administers oral medications as prescribed . Self administers insulin as prescribed . Attends all scheduled provider appointments . Checks blood sugars as prescribed and utilize hyper and hypoglycemia protocol as needed . Adheres to prescribed ADA/carb modified  Initial goal documentation Please see other previous [facility] care plan information listed in Epic under the flow sheet section         Mackenzy Grumbine L. Lavina Hamman, RN, BSN, [place] Coordinator Office number [phone] [facility] number [phone] Fax number [phone]

## 2020-02-03 NOTE — Progress Notes (Signed)
Virtual Visit via Telephone Note   This visit type was conducted due to national recommendations for restrictions regarding the COVID-19 Pandemic (e.g. social distancing) in an effort to limit this patient's exposure and mitigate transmission in our community.  Due to her co-morbid illnesses, this patient is at least at moderate risk for complications without adequate follow up.  This format is felt to be most appropriate for this patient at this time.  The patient did not have access to video technology/had technical difficulties with video requiring transitioning to audio format only (telephone).  All issues noted in this document were discussed and addressed.  No physical exam could be performed with this format.  Please refer to the patient's chart for her  consent to telehealth for [facility].   Date:  02/04/2020   ID:  [name], DOB 11-17-1955, MRN [id number]  Patient Location: Home Provider Location: Home Office  PCP:  [name], MD  Cardiologist:  Quay Burow, MD Electrophysiologist:  None   Evaluation Performed:  Follow-Up Visit  Chief Complaint:  Elevated blood sugar wants referral to Endocrinologist.   History of Present Illness:    [name] is a 64 y.o. female with we are following for ongoing assessment and management of hypertension, hyperlipidemia, peripheral arterial disease with critical limb ischemia in the left fourth toe, with abnormal ABIs on 11/03/2014 recorded as occluded left FSA.  Dr. Gwenlyn Found performed [facility] 1 directional atherectomy, PTA, and stenting using a Viabahn covered stent, with superior angiographic and clinical results.  Subsequently, the patient had a repeat angiography of her left FSA on 01/29/2016 to evaluate stent.  She ultimately underwent left femoral-popliteal bypass grafting on 06/09/2016 by Dr. Trula Slade using a PTFE.  She also had a cardiac catheterization in 2018 due to recurrent chest discomfort.  This revealed minimal CAD.   She unfortunately continues to smoke.  When last seen by Dr. Gwenlyn Found on 09/24/2019 she had had some unintentional weight loss and complaints of dizziness.  At the time of the office visit no medication changes were made.  She complaints that her blood sugar remains elevated >350.  She has been seen by PCP for multiple medications changes. She denies chest pain, DOE, extreme fatigue. Main complaint is GI in etiology with constipation, abdominal pain, and anorexia.   The patient does not have symptoms concerning for COVID-19 infection (fever, chills, cough, or new shortness of breath).    Past Medical History:  Diagnosis Date  . 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 Anemia    hx  . Angioedema 07/10/2015  . Anxiety   . Arthritis    "neck, left hand" (09/14/2012)  . Asthma   . Cancer ([place]) 1985   ovarian, no treatment except surgery  . Complication of anesthesia    OCCASIONAL TROUBLE TURNING NECK TO RIGHT  . Critical lower limb ischemia ([place])    10/2014 s/p L SFA stenting  . Diverticulosis of colon with hemorrhage April 2013  . GERD (gastroesophageal reflux disease)   . GI bleed   . H/O hiatal hernia   . Heart attack [facility])    2003 mild MI, March 2013 mild MI  . Hidradenitis    groin  . History of blood transfusion 1985 AND 2013  . Hyperlipidemia   . Hypertension   . Irritable bowel syndrome   . Left-sided weakness    since stroke, left eye trouble seeing  . Migraines   . Mild CAD    a. Cath 09/2010: mild  luminal irregularities of LAD, 30% prox RCA and 20-30% mRCA, EF 65%.  . Neuropathy   . Obesity   . PAD (peripheral artery disease) ([place])    a. critical limb ischemia s/p PTA/stenting of L SFA 10/2014. c. occ prior SFA stent by angio 01/2016, for possible PV bypass.  . Pneumonia    baby  . Recurrent upper respiratory infection (URI)   . S/P arterial stent-mid Lt SFA 11/03/14 11/04/2014  . Schatzki's ring   . Sinus problem   . Stomach ulcer 1972   non-bleeding  . Stroke ([place]) 07-2007,  07-2008, 07-2009   total 3 strokes; mild left sided weakness and left eye "jumps".  . Type II diabetes mellitus ([place])   . Vitamin B 12 deficiency 07-15-2013   Past Surgical History:  Procedure Laterality Date  . ABDOMINAL HYSTERECTOMY  1985  . ADENOIDECTOMY    . ANTERIOR CERVICAL DECOMP/DISCECTOMY FUSION  2002  . ANTERIOR CERVICAL DECOMP/DISCECTOMY FUSION N/A 04/08/2014   Procedure: Cervical Six-Seven ANTERIOR CERVICAL DECOMPRESSION/DISCECTOMY FUSION Plating and Bonegraft  2 LEVELS;  Surgeon: Ashok Pall, MD;  Location: [place] NEURO ORS;  Service: Neurosurgery;  Laterality: N/A;  Cervical Six-Seven ANTERIOR CERVICAL DECOMPRESSION/DISCECTOMY FUSION Plating and Bonegraft  2 LEVELS  . APPENDECTOMY  1985  . AXILLARY HIDRADENITIS EXCISION  1990-2008   bilateral  . BACK SURGERY    . BREAST BIOPSY Right 2007  . BREAST CYST EXCISION Right 2008  . BREAST REDUCTION SURGERY    . CARDIAC CATHETERIZATION  2004   mild disease  . CATARACT EXTRACTION W/PHACO Left 05/16/2015   Procedure: CATARACT EXTRACTION PHACO AND INTRAOCULAR LENS PLACEMENT (IOC);  Surgeon: Rutherford Guys, MD;  Location: [facility] OR[facility];  Service: Ophthalmology;  Laterality: Left;  CDE: 4.24  . CATARACT EXTRACTION W/PHACO Right 05/30/2015   Procedure: CATARACT EXTRACTION RIGHT EYE PHACO AND INTRAOCULAR LENS PLACEMENT ;  Surgeon: Rutherford Guys, MD;  Location: [facility]S;  Service: Ophthalmology;  Laterality: Right;  CDE:4.08  . CHOLECYSTECTOMY  1990's  . COLONOSCOPY  08/12/2011   Procedure: COLONOSCOPY;  Surgeon: Ladene Artist, MD,FACG;  Location: [facility] ENDOSCOPY;  Service: Endoscopy;  Laterality: N/A;  . COLONOSCOPY  06/19/2006  . COLONOSCOPY WITH PROPOFOL N/A 07/02/2019   Procedure: COLONOSCOPY WITH PROPOFOL;  Surgeon: Rogene Houston, MD;  [facility];  Service: Endoscopy;  Laterality: N/A;  . cyst thigh Right   . ESOPHAGOGASTRODUODENOSCOPY  08/12/2011   Procedure: ESOPHAGOGASTRODUODENOSCOPY (EGD);  Surgeon: Ladene Artist, MD,FACG;  Location: [facility]  ENDOSCOPY;  Service: Endoscopy;  Laterality: N/A;  . ESOPHAGOGASTRODUODENOSCOPY  06/04/2005  . ESOPHAGOGASTRODUODENOSCOPY (EGD) WITH PROPOFOL N/A 07/01/2019   Procedure: ESOPHAGOGASTRODUODENOSCOPY (EGD) WITH PROPOFOL;  Surgeon: Rogene Houston, MD;  [facility];  Service: Endoscopy;  Laterality: N/A;  . FEMORAL-POPLITEAL BYPASS GRAFT Left 06/19/2016   Procedure: Left Leg BYPASS GRAFT FEMORAL-POPLITEAL ARTERY;  Surgeon: Serafina Mitchell, MD;  Location: [place];  Service: Vascular;  Laterality: Left;  . GIVENS CAPSULE STUDY  08/13/2011   Procedure: GIVENS CAPSULE STUDY;  Surgeon: Ladene Artist, MD,FACG;  Location: [facility] ENDOSCOPY;  Service: Endoscopy;  Laterality: N/A;  . HAMMER TOE SURGERY Bilateral ~ 2000  . HEMOSTASIS CLIP PLACEMENT  07/02/2019   Procedure: HEMOSTASIS CLIP PLACEMENT;  Surgeon: Rogene Houston, MD;  [facility];  Service: Endoscopy;;  hepatic flexure  . HIATAL HERNIA REPAIR    . HYDRADENITIS EXCISION  01/2011; 03/2012   'groin and abdomen; 03/2012" (09/14/2012)  . HYDRADENITIS EXCISION  04/01/2012   Procedure: EXCISION HYDRADENITIS  GROIN;  Surgeon: Pedro Earls, MD;  Location: [facility] ORS;  Service: General;  Laterality: Bilateral;  Excision of Hydradenitis of Perineum  . HYDRADENITIS EXCISION N/A 09/17/2013   Procedure: EXCISION PERINEAL HIDRADENITIS ;  Surgeon: Pedro Earls, MD;  Location: [facility] ORS;  Service: General;  Laterality: N/A;  also in the pubis area  . LEFT HEART CATH AND CORONARY ANGIOGRAPHY N/A 12/26/2016   Procedure: LEFT HEART CATH AND CORONARY ANGIOGRAPHY;  Surgeon: [name], Peter M, MD;  Location: [place] CV LAB;  Service: Cardiovascular;  Laterality: N/A;  . MASS EXCISION Right 09/17/2013   Procedure: EXCISION MASS;  Surgeon: Pedro Earls, MD;  Location: [facility] ORS;  Service: General;  Laterality: Right;  . NISSEN FUNDOPLICATION  9024  . PERIPHERAL VASCULAR CATHETERIZATION N/A 11/03/2014   Procedure: Lower Extremity Angiography;  Surgeon:  Lorretta Harp, MD;  Location: [place] CV LAB;  Service: Cardiovascular;  Laterality: N/A;  . PERIPHERAL VASCULAR CATHETERIZATION N/A 01/29/2016   Procedure: Lower Extremity Angiography;  Surgeon: Lorretta Harp, MD;  Location: [place] CV LAB;  Service: Cardiovascular;  Laterality: N/A;  . POLYPECTOMY  07/02/2019   Procedure: POLYPECTOMY;  Surgeon: Rogene Houston, MD;  [facility];  Service: Endoscopy;;  . POSTERIOR LUMBAR FUSION  2008 X 2  . REDUCTION MAMMAPLASTY  1996?  . TONSILLECTOMY AND ADENOIDECTOMY  1959 AND 2000  . UVULOPALATOPHARYNGOPLASTY, TONSILLECTOMY AND SEPTOPLASTY  2000's     Current Meds  Medication Sig  . acetaminophen (TYLENOL) 325 MG tablet Take 2 tablets (650 mg total) by mouth every 6 (six) hours as needed for mild pain (or Fever >/= 101).  Marland Kitchen albuterol (PROAIR HFA) 108 (90 Base) MCG/ACT inhaler 1-2 inhalations every 4-6 hours as needed for cough or wheeze. (Patient taking differently: Inhale 1-2 puffs into the lungs every 4 (four) hours as needed for shortness of breath. )  . ALPRAZolam (XANAX) 0.5 MG tablet Take 0.5 mg by mouth 2 (two) times daily as needed for anxiety.  . Azelastine HCl 0.15 % SOLN Place 2 sprays into both nostrils 2 (two) times daily. (Patient taking differently: Place 2 sprays into both nostrils 2 (two) times daily as needed (allergies). )  . citalopram (CELEXA) 10 MG tablet Take 10 mg by mouth daily as needed.   . clopidogrel (PLAVIX) 75 MG tablet Take 1 tablet (75 mg total) by mouth daily.  Marland Kitchen EPINEPHrine 0.3 mg/0.3 mL IJ SOAJ injection Inject 0.3 mLs (0.3 mg total) into the muscle once. (Patient taking differently: Inject 0.3 mg into the muscle as needed for anaphylaxis. )  . Fluocinolone Acetonide Body 0.01 % OIL Apply topically 2 (two) times daily.  . insulin lispro (HUMALOG) 100 UNIT/ML injection Inject 5-12 Units into the skin 2 (two) times daily before a meal. Per sliding scale using OMNIPOD  . loperamide (IMODIUM) 2 MG  capsule Take 1 capsule (2 mg total) by mouth as needed for diarrhea or loose stools.  Marland Kitchen losartan-hydrochlorothiazide (HYZAAR) 50-12.5 MG tablet Take 1 tablet by mouth daily.  . Magnesium 200 MG TABS Take 1 tablet (200 mg total) by mouth daily.  . metoCLOPramide (REGLAN) 5 MG tablet Take 1 tablet (5 mg total) by mouth every 6 (six) hours as needed for nausea or vomiting.  . mupirocin ointment (BACTROBAN) 2 % Apply 1 application topically 2 (two) times daily. (Patient taking differently: Apply 1 application topically 2 (two) times daily as needed. )  . nitroGLYCERIN (NITROSTAT) 0.4 MG SL tablet PLACE 1 TAB UNDER  TONGUE EVERY 5 MINS AS NEEDED FOR CHEST PAIN - MAX 3 DOSES THEN 911 (Patient taking differently: Place 0.4 mg under the tongue every 5 (five) minutes as needed for chest pain. MAX 3 DOSES THEN 911)  . ondansetron (ZOFRAN) 4 MG tablet Take 1 tablet (4 mg total) by mouth daily as needed for nausea or vomiting.  . pantoprazole (PROTONIX) 40 MG tablet TAKE 1 TABLET BY MOUTH EVERY DAY (Patient taking differently: Take 40 mg by mouth daily. )  . potassium chloride 20 MEQ TBCR Take 10 mEq by mouth 2 (two) times daily.  . propranolol ER (INDERAL LA) 60 MG 24 hr capsule Take 1 capsule (60 mg total) by mouth every evening.  . rosuvastatin (CRESTOR) 40 MG tablet Take 1 tablet (40 mg total) by mouth every morning.  . SUMAtriptan (IMITREX) 100 MG tablet Take 100 mg by mouth every 2 (two) hours as needed for migraine.   . temazepam (RESTORIL) 30 MG capsule Take 30 mg by mouth at bedtime as needed for sleep.  . traZODone (DESYREL) 100 MG tablet Take 1 tablet (100 mg total) by mouth at bedtime.  . TRESIBA 100 UNIT/ML SOLN SMARTSIG:35 Unit(s) SUB-Q Daily  . [DISCONTINUED] metoCLOPramide (REGLAN) 5 MG tablet      Allergies:   Sulfa antibiotics, Codeine, Fish allergy, Iodine, Metformin and related, Soliqua [insulin glargine-lixisenatide], Adhesive [tape], and Shellfish allergy   Social History   Tobacco Use   . Smoking status: Former Smoker    Packs/day: 0.50    Years: 41.00    Pack years: 20.50    Types: Cigarettes    Quit date: 01/15/2016    Years since quitting: 4.0  . Smokeless tobacco: Never Used  . Tobacco comment: Getting ready to start nicotine patches RX by Dr. Gwenlyn Found per pt.  Substance Use Topics  . Alcohol use: No    Alcohol/week: 0.0 standard drinks  . Drug use: No    Types: "Crack" cocaine    Comment: 05/09/2015.  "quit 07/27/1994"     Family Hx: The patient's family history includes Allergic rhinitis in her sister; Aneurysm in her sister; Breast cancer in her mother; Diabetes in her father and sister; Emphysema in an other family member; Heart disease in her father, mother, and sister; Hyperlipidemia in her sister; Hypertension in her mother, sister, and sister; Stroke in her father. There is no history of Angioedema, Asthma, Eczema, Immunodeficiency, or Urticaria.  ROS:   Please see the history of present illness.    All other systems reviewed and are negative.   Prior CV studies:   The following studies were reviewed today: Echocardiogram 06/21/2019 1. Left ventricular ejection fraction, by estimation, is 65 to 70%. The  left ventricle has hyperdynamic function. The left ventricle has no  regional wall motion abnormalities. There is mild concentric left  ventricular hypertrophy. Left ventricular  diastolic parameters are consistent with Grade I diastolic dysfunction  (impaired relaxation).  2. Right ventricular systolic function is normal. The right ventricular  size is normal.  3. The mitral valve is grossly normal. No evidence of mitral valve  regurgitation.  4. The aortic valve is tricuspid. Aortic valve regurgitation is not  visualized. No aortic stenosis is present.  5. The inferior vena cava is normal in size with greater than 50%  respiratory variability, suggesting right atrial pressure of 3 mmHg.    Labs/Other Tests and Data Reviewed:    EKG:  No ECG  reviewed.  Recent Labs: 07/28/2019: ALT 22; TSH 0.651 07/30/2019:  BUN 17; Creatinine, Ser 1.18; Hemoglobin 11.0; Magnesium 2.0; Platelets 207; Potassium 3.2; Sodium 139   Recent Lipid Panel Lab Results  Component Value Date/Time   CHOL 103 07/06/2011 05:00 AM   TRIG 176 (H) 07/06/2011 05:00 AM   HDL 32 (L) 07/06/2011 05:00 AM   CHOLHDL 3.2 07/06/2011 05:00 AM   LDLCALC 36 07/06/2011 05:00 AM    Wt Readings from Last 3 Encounters:  02/04/20 127 lb (57.6 kg)  01/14/20 126 lb 6.4 oz (57.3 kg)  01/06/20 128 lb 8 oz (58.3 kg)     Objective:    Vital Signs:  BP (!) 143/86   Pulse (!) 106   Ht 5\' 3"  (1.6 m)   Wt 127 lb (57.6 kg)   BMI 22.50 kg/m    Telephone visit.   ASSESSMENT & PLAN:    1. Hypertension: Moderately controlled today. Would like to have it be in the 130s over 70s or lower if possible. She is in a lot of pain right now with a lot of GI issues. Due to have a CT scan this afternoon. She has lost over 20 pounds. We'll follow blood pressure and make adjustments on next visit should it be necessary.  2.  Hyperglycemia: She is requesting referral to endocrinology. She is not happy with blood glucose control currently. I'm referring her to endocrinology via [place]. She is aware that they will be calling her to make the appointment. I have explained to her that it is very important to keep up with her blood sugar in the setting of peripheral arterial disease. She verbalizes understanding  3. Peripheral arterial disease: Currently stable and not causing her any issues at this time. She does have an ingrown toenail on the right. I have advised her to be careful with that and to monitor it and see a foot specialist should this become more painful to avoid infection. She verbalizes understanding  COVID-19 Education: The signs and symptoms of COVID-19 were discussed with the patient and how to seek care for testing (follow up with PCP or arrange E-visit).  The importance of  social distancing was discussed today.  Time:   Today, I have spent 25 minutes with the patient with telehealth technology discussing the above problems.     Medication Adjustments/Labs and Tests Ordered: Current medicines are reviewed at length with the patient today.  Concerns regarding medicines are outlined above.   Tests Ordered: Orders Placed This Encounter  Procedures  . Ambulatory referral to Endocrinology    Medication Changes: No orders of the defined types were placed in this encounter.   Disposition:  Follow up in 1 year(s)  Signed, Phill Myron. [place] Pugh, ANP, AACC  02/04/2020 10:49 AM    [facility]

## 2020-02-04 ENCOUNTER — Telehealth (INDEPENDENT_AMBULATORY_CARE_PROVIDER_SITE_OTHER): Payer: Medicare Other | Admitting: Adult Health

## 2020-02-04 ENCOUNTER — Encounter (HOSPITAL_COMMUNITY): Payer: Self-pay

## 2020-02-04 ENCOUNTER — Other Ambulatory Visit: Payer: Self-pay

## 2020-02-04 ENCOUNTER — Encounter: Payer: Self-pay | Admitting: Adult Health

## 2020-02-04 ENCOUNTER — Ambulatory Visit (HOSPITAL_COMMUNITY)
Admission: RE | Admit: 2020-02-04 | Discharge: 2020-02-04 | Disposition: A | Discharge status: Home / Self Care | Payer: Medicare Other | Source: Ambulatory Visit | Attending: Internal Medicine | Admitting: Internal Medicine

## 2020-02-04 VITALS — BP 143/86 | HR 106 | Ht 63.0 in | Wt 127.0 lb

## 2020-02-04 DIAGNOSIS — I10 Essential (primary) hypertension: Secondary | ICD-10-CM | POA: Diagnosis not present

## 2020-02-04 DIAGNOSIS — R197 Diarrhea, unspecified: Secondary | ICD-10-CM

## 2020-02-04 DIAGNOSIS — R634 Abnormal weight loss: Secondary | ICD-10-CM

## 2020-02-04 DIAGNOSIS — Z95828 Presence of other vascular implants and grafts: Secondary | ICD-10-CM

## 2020-02-04 DIAGNOSIS — E0849 Diabetes mellitus due to underlying condition with other diabetic neurological complication: Secondary | ICD-10-CM | POA: Diagnosis not present

## 2020-02-04 DIAGNOSIS — I739 Peripheral vascular disease, unspecified: Secondary | ICD-10-CM

## 2020-02-04 DIAGNOSIS — E1143 Type 2 diabetes mellitus with diabetic autonomic (poly)neuropathy: Secondary | ICD-10-CM | POA: Diagnosis not present

## 2020-02-04 DIAGNOSIS — K3184 Gastroparesis: Secondary | ICD-10-CM

## 2020-02-04 MED ORDER — IOHEXOL 300 MG/ML  SOLN: 100.0000 mL | Freq: Once | INTRAMUSCULAR | Status: DC | PRN

## 2020-02-04 NOTE — Patient Instructions (Addendum)
Medication Instructions:  Continue current medications  *If you need a refill on your cardiac medications before your next appointment, please call your pharmacy*   Lab Work: None Ordered   Testing/Procedures: None Ordered   Follow-Up: At [facility], you and your health needs are our priority.  As part of our continuing mission to provide you with exceptional heart care, we have created designated Provider Care Teams.  These Care Teams include your primary Cardiologist (physician) and Advanced Practice Providers (APPs -  Physician Assistants and Nurse Practitioners) who all work together to provide you with the care you need, when you need it.  We recommend signing up for the patient portal called "MyChart".  Sign up information is provided on this After Visit Summary.  MyChart is used to connect with patients for Virtual Visits (Telemedicine).  Patients are able to view lab/test results, encounter notes, upcoming appointments, etc.  Non-urgent messages can be sent to your provider as well.   To learn more about what you can do with MyChart, go to [email or web address].    Your next appointment:   1 year(s)  The format for your next appointment:   In Person  Provider:   You may see Quay Burow, MD or one of the following Advanced Practice Providers on your designated Care Team:    Kerin Ransom, PA-C  [place], [place]  Coletta Memos, [place]   OTHER:  You have been referred to Endocrinologist

## 2020-02-07 ENCOUNTER — Encounter: Payer: Self-pay | Admitting: *Deleted

## 2020-02-07 ENCOUNTER — Other Ambulatory Visit: Payer: Self-pay

## 2020-02-07 ENCOUNTER — Other Ambulatory Visit: Payer: Self-pay | Admitting: *Deleted

## 2020-02-07 NOTE — Patient Outreach (Addendum)
[place] [facility]) Care Management  02/07/2020  [name] 07/06/1955 [id number]  THNoutreach to [place] liaison referred patient Referral Date:08/02/19 Referral Source:[place], [facility] hospital liaison Referral Reason:Post hospital follow up Diagnoses of Diabetes,CKD Please refer to community telephonic RN for care management services. Insurance:[employer] medicare   Last admission 07/28/19 to 07/30/19  Outreach attemptsuccessful Patient is able to verify HIPAA ([place]) identifiers, date of birth (DOB) and address  Progression Abdominal pain [name] reports the recent scheduled CT scan ordered by Dr Nevada Crane was not able to be completed as the staff were not able to find a venous access after 9 attempt. Assessed possible reasons on for poor venous access. She reports she was hydrated and was given 2 bottles of water during the process She also voices she did not take her Plavix in which she believes may have assisted  Presently she is experiencing constipation and will be following her gastroenterology action plan of administering her "cocktail" (miralax +)    Diabetes cbgs still remain elevated in 300s  Last HgA1c 9.1 06/28/19  She reports with an outreach from her cardiology with her requesting a referral to a local endocrinology  [facility] RN Cm discussed how uncontrolled diabetes causes concerns with blood vessels  She discussed potassium given during her hospital that she feels have made her veins less accessible     [facility] RN CM supportive interventions  Discussed with [name]. She gave permission for [facility] RN CM to outreach to [facility] Radiology/imaging department to discuss scheduling her for a CT in the am and for the next available at any [facility] imaging location Mrs Brownley reports she would go to [place] for the CT if needed  [facility] RN CM spoke with Ginger at [facility]  radiology scheduling [phone] who clarifies that a new CT abdomen order is needed She also shared that in the new order it could be noted that the patient request morning procedure and for it to be scheduled as next available at any location   Outreach to [employer] at Dr Nevada Crane office [phone] prompt 1 to advocate for pt request for new order for CT abdomen noting that the patient requests a morning procedure and for it to be scheduled as next available at any location   Outreach to update patient HIPAA identifiers stated Pt updated on outreached to Ginger and Amber Pt wanted to review the bowel purge provided by Dr Carlean Purl during the 08/19/19 visit Reviewed with her Take 4 dulcolax 5 mg tablets. Wait 1 hour. You will then drink 6-8 capfuls of Miralax mixed in an adequate amount of water/juice/gatorade (you may choose which of these liquids to drink) over the next 2-3 hours. You should expect results within 1 to 6 hours after completing the bowel purge. Sent this to her via mychart and letter    [facility] RN CM will follow up with patient within the next 30 business days Pt encouraged to return a call to [facility] RN CM prn Routed note to MD Goals Addressed              This Visit's Progress     Patient Stated   .  [facility]) Manage My Diet (pt-stated)   Not on track     Follow Up Date 02/28/20    - avoid foods that make my symptoms worse - drink at least 6 glasses of fluids a day - take  time to eat    Why is this important?   Changing what you eat and drink may help you to manage your condition.  It is important to take it slow, making 1 change at a time.  A dietitian is the best person to guide you.    Notes:  present constipation, pt to administer previous discussed gastroenterology "cocktail"  of medications per her action plan Failed CT abdomen to be rescheduled with pt preference in morning and next available at any [facility] imaging/radiology location     .  [facility]) Monitor and  Manage My Blood Sugar (pt-stated)   Not on track     Follow Up Date 02/28/20   - check blood sugar at prescribed times - check blood sugar if I feel it is too high or too low - take the blood sugar meter to all doctor visits    Why is this important?   Checking your blood sugar at home helps to keep it from getting very high or very low.  Writing the results in a diary or log helps the doctor know how to care for you.  Your blood sugar log should have the time, date and the results.  Also, write down the amount of insulin or other medicine that you take.  Other information, like what you ate, exercise done and how you were feeling, will also be helpful.     Notes: reports cbg values elevated in 300s     .  COMPLETED: [facility]) Patient will be able to manage her DM, HTN and GI medical issues at home (pt-stated)        Bend (see longitudinal plan of care for additional care plan information)  Objective:  Lab Results  Component Value Date   HGBA1C 9.1 (H) 06/28/2019 .   Lab Results  Component Value Date   CREATININE 1.18 (H) 07/30/2019   CREATININE 1.40 (H) 07/29/2019   CREATININE 1.84 (H) 07/28/2019 .   Marland Kitchen No results found for: EGFR  Current Barriers:  Marland Kitchen Knowledge Deficits related to basic Diabetes and IBS pathophysiology and self care/management . Cognitive Deficits  Case Manager Clinical Goal(s):  Over the next 90 days, patient will demonstrate improved adherence to prescribed treatment plan for diabetes self care/management as evidenced by:  Marland Kitchen Verbalize adherence to ADA/ carb modified diet within the next 30 days . Patient will report less sick days within then next 45 business days during outreach . 02/07/20 not on track continues with elevated cbg values in 300s present constipation, 02/04/20 failed CT abdomen related to poor vein access . This goal completed and progressed to Patient care plans (elsevier)   Interventions:  . Provided education to patient about  basic DM disease process . Reviewed medications with patient and discussed importance of medication adherence . Discussed plans with patient for ongoing care management follow up and provided patient with direct contact information for care management team . Provided patient with written educational materials related to hypo and hyperglycemia and importance of correct treatment . Reviewed scheduled/upcoming provider appointments including: pcp gastroenterology  Patient Self Care Activities:  . Self administers oral medications as prescribed . Self administers insulin as prescribed . Attends all scheduled provider appointments . Checks blood sugars as prescribed and utilize hyper and hypoglycemia protocol as needed . Adheres to prescribed ADA/carb modified  Please see past updates related to this goal by clicking on the "Past Updates" button in the selected goal  Please see other previous [facility] care  plan information listed in Epic under the flow sheet section      .  [facility]) Track My Symptoms (pt-stated)   Not on track     Follow Up Date 02/28/20    - track what makes symptoms worse and what makes them better    Why is this important?   Keeping track of symptoms that you have helps you to understand your condition. You will also learn what works to manage it.  You and your doctor will then be able to come up with the best treatment plan for you.    Notes:         Cassadie Pankonin L. Lavina Hamman, RN, BSN, [place] Coordinator Office number [phone] Main [facility] number [phone] Fax number [phone]

## 2020-02-08 ENCOUNTER — Other Ambulatory Visit: Payer: Self-pay | Admitting: *Deleted

## 2020-02-08 ENCOUNTER — Telehealth: Payer: Self-pay | Admitting: Internal Medicine

## 2020-02-08 NOTE — Telephone Encounter (Signed)
Medha with [facility] called to inform about pt's GI issues. She stated that pt has been dealing with constipation. Yesterday, she took a GI cocktail that was recommended by Dr. Carlean Purl last April but it did not work. Her PCP Dr. Nevada Crane is trying to get her in for a CT scan abd. Daryl is asking if we can give pt some advise or soon appt. She wants [place] to call pt directly at [phone].

## 2020-02-08 NOTE — Patient Outreach (Signed)
[place] [facility]) Care Management  02/08/2020  [name] July 22, 1955 [id number]   [facility] Outreach from patient- GI concerns  Patient returned a call to [place] [name] confirms she received outreach from [place] for a 02/08/20 1010 appointment at gastroenterologist office She confirms abdominal bloating/stomach distensionwith a weight (wt) gain from 123.7 lbs to 131.1 lbs  She weighed during this outreach   Plan [facility] RN CM scheduled this [facility] engaged patient for another call attempt within 7-10 business days or prn  Routed note to MD    Joelene Millin L. Lavina Hamman, RN, BSN, [place] Coordinator Office number [phone] Mobile number [phone]  Main [name] number [phone] Fax number [phone]

## 2020-02-08 NOTE — Patient Outreach (Signed)
[place] [facility]) Care Management  02/08/2020  SANTIANA GLIDDEN 30-May-1955 [id number]   [place] coordination  Outreach from [place], [place], Dr Carlean Purl office Discussed pt constipation & CT scan voiced concerns on 02/07/20 & 02/08/20  Judeen Hammans to outreach to pt for scheduling possible office visit  Plan [facility] RN CM scheduled this [facility] engaged patient for another call attempt within 7-10 business days   [place] L. Lavina Hamman, RN, BSN, [place] Coordinator Office number [phone] Mobile number [phone]  Main [name] number [phone] Fax number [phone]

## 2020-02-08 NOTE — Telephone Encounter (Signed)
Patient contacted and she will come in and see Dr. Carlean Purl tomorrow at 10:10

## 2020-02-08 NOTE — Patient Outreach (Signed)
[place] [facility]) Care Management  02/08/2020  LOWEN MANSOURI 1955-11-25 [id number]   Unsuccessful [name] outreach   [facility] RN CM returned a call to pt voice messages left at 1319, 1320 on today  She reported her GI cocktail did not work  No answer. [name] RN CM left HIPAA [facility] Portability and Accountability Act) compliant voicemail message along with CM's contact info.   [facility] RN CM called Dr Carlean Purl office [phone] Left a message with Juliann Pulse  She is to send a message to Dr Carlean Purl and/or his nurse about pt continued constipation after attempting the April 2021 bowel purge recommendation on 02/07/20 evening Discussed failed CT abdomen on 02/04/20 (orded by pcp ) plus pt preference to get new order for CT abdomen first available morning appointment at any [facility] imaging/radiology area to include [place] or [place]: [facility] RN CM scheduled this [facility] engaged patient for another call attempt within 7-10 business days  [place] L. Lavina Hamman, RN, BSN, [place] Coordinator Office number [phone] Main [facility] number [phone] Fax number [phone]

## 2020-02-09 ENCOUNTER — Ambulatory Visit (INDEPENDENT_AMBULATORY_CARE_PROVIDER_SITE_OTHER): Payer: Medicare Other | Admitting: Internal Medicine

## 2020-02-09 ENCOUNTER — Other Ambulatory Visit: Payer: Self-pay | Admitting: *Deleted

## 2020-02-09 ENCOUNTER — Other Ambulatory Visit: Payer: Self-pay

## 2020-02-09 ENCOUNTER — Ambulatory Visit (INDEPENDENT_AMBULATORY_CARE_PROVIDER_SITE_OTHER)
Admission: RE | Admit: 2020-02-09 | Discharge: 2020-02-09 | Disposition: A | Discharge status: Home / Self Care | Payer: Medicare Other | Source: Ambulatory Visit | Attending: Internal Medicine | Admitting: Internal Medicine

## 2020-02-09 ENCOUNTER — Encounter: Payer: Self-pay | Admitting: Internal Medicine

## 2020-02-09 VITALS — BP 130/68 | HR 82 | Ht 63.5 in | Wt 132.0 lb

## 2020-02-09 DIAGNOSIS — K5900 Constipation, unspecified: Secondary | ICD-10-CM

## 2020-02-09 DIAGNOSIS — E1143 Type 2 diabetes mellitus with diabetic autonomic (poly)neuropathy: Secondary | ICD-10-CM | POA: Diagnosis not present

## 2020-02-09 DIAGNOSIS — R140 Abdominal distension (gaseous): Secondary | ICD-10-CM

## 2020-02-09 DIAGNOSIS — K582 Mixed irritable bowel syndrome: Secondary | ICD-10-CM

## 2020-02-09 NOTE — Patient Outreach (Signed)
[place] [facility]) Care Management  02/09/2020  [name] 1955-10-29 [id number]   [name] Outreach from patient- GI appointment update  Patient called [facility] RN CM Patient is able to verify HIPAA ([place] and Accountability Act) identifiers Reviewed and addressed the purpose of the follow up call with the patient  Consent: [facility] RN CM reviewed [facility] services with patient. Patient gave verbal consent for services.  [name] confirms she she did see Dr Carlean Purl today 02/09/20 at 1010 appointment at gastroenterologist office She had 2 view abdominal xray today indicating and will have CT abdomen on 02/10/20 Dr Carlean Purl want to get her to PT services for pelvic floor PT  [facility] RN CM discussed the importance of her attending the pelvic floor PT session [facility] RN CM conference with patient to radiology spoke with [name] who assisted with transfer to [phone]  No answer. [name] RN Cm encouraged patient to go rest as she has a migraine and did not sleep well on last evening  [name] RN CM outreached to [phone] ext [place] answered [facility] RN CM advocated for patient and [place] confirms pt can register and answer any needed questions for the CT on tomorrow 02/10/20 [facility] RN CM called pt and left a message updating her about this Pt encouraged to rest, take her imitrex and register information for CT on 02/10/20 with [place] RN CM scheduled this [facility] engaged patient for another call attempt within 7-10 business days or prn      [name], RN, BSN, [place] Coordinator Office number [phone] Mobile number [phone]  Main [name] number [phone] Fax number [phone]

## 2020-02-09 NOTE — Patient Instructions (Signed)
Please go to the basement and get an x-ray taken before leaving today.  Due to recent changes in healthcare laws, you may see the results of your imaging and laboratory studies on MyChart before your provider has had a chance to review them.  We understand that in some cases there may be results that are confusing or concerning to you. Not all laboratory results come back in the same time frame and the provider may be waiting for multiple results in order to interpret others.  Please give [place] 48 hours in order for your provider to thoroughly review all the results before contacting the office for clarification of your results.    I appreciate the opportunity to care for you. Silvano Rusk, MD, [facility]

## 2020-02-09 NOTE — Progress Notes (Signed)
[name] 64 y.o. 1955-06-29 [id number]  Assessment & Plan:   Encounter Diagnoses  Name Primary?  . Constipation, unspecified constipation type Yes  . Bloating   . Irritable bowel syndrome with both constipation and diarrhea   . Diabetic autonomic neuropathy associated with type 2 diabetes mellitus ([place])    2 view abdominal films today  FINDINGS: The bowel gas pattern is nonobstructive. There is no supine evidence of free intraperitoneal air. Multiple surgical clips and extensive vascular calcifications are again noted. There are soft tissue calcifications within the right buttocks.  IMPRESSION: Radiographically stable appearance of the abdomen without acute findings. Extensive vascular calcifications.     Follow-up on CT scan tomorrow  IMPRESSION: No acute findings within the abdomen or pelvis.  Stable small left upper quadrant interparietal abdominal wall hernia containing only fat.  Aortic Atherosclerosis (ICD10-I70.0).   Keep pelvic floor physical therapy appointment  Consider testing for small intestinal bacterial overgrowth versus empiric treatment with a different regimen she did not seem to respond to Xifaxan.  Would prefer testing.  Agree with referral to endocrinology, advised she should not be eating foods like ice cream, question adherence to appropriate diet.  Hyperglycemia and diabetes out-of-control is clearly an issue.  It could be due to adherence issues but it could also be due to some as yet undiagnosed underlying condition.    She has peripheral vascular disease and atherosclerosis issues But I do not get a history consistent with mesenteric ischemia  CC: [name], MD Jory Sims, NP   Subjective:   Chief Complaint: Constipation abdominal pain  HPI 64 year old African-American woman with irritable bowel syndrome with both constipation and diarrhea, diabetic gastroparesis, chronic abdominal complaints failing to respond  to multiple treatments.  Also status post Nissen fundoplication in the 5643P.  EGD and colonoscopy February 2021 during admission to [facility] revealed intact but loose fundoplication, no cause of bleeding there, a diminutive adenoma and diverticulosis on colonoscopy.  She said constipation has been increasing and she is gaining weight.  Feels very bloated.  About 2 weeks ago she tried 2 Dulcolax without benefit then she took MiraLAX purge with little results.  Then again 2 days ago she repeated a MiraLAX purge with 4 Dulcolax and 6 or 8 glasses of MiraLAX and says she has had just a little bit of loose stool out.  She continues to feel bloated and unwell.  Lots of burning in the epigastrium off and on and is often eating ice cream at night to "cool my stomach".  She says sugars are still high.  She had seen Dr. [place] Crane of primary care and he has ordered a CT of the abdomen and pelvis she went last week but they could not get an IV with 9 attempts so she is rescheduled for tomorrow.  Cardiology has referred her to endocrinology.  She reported to cardiology on October 1 that her blood sugars are still more than 350 at times.  A1c was greater than 8% in February Wt Readings from Last 3 Encounters:  02/09/20 132 lb (59.9 kg)  02/08/20 131 lb (59.4 kg)  02/04/20 127 lb (57.6 kg)   When I last saw her about 6 weeks ago we referred her to pelvic floor physical therapy and she has an appointment for November.  Rectal exam did not reveal an impaction but demonstrated decreased resting tone and voluntary squeeze and limited descent on simulated defecation.  Also note that previous trial of Xifaxan was unhelpful for  empiric treatment of small intestinal bacterial overgrowth.  Allergies  Allergen Reactions  . Sulfa Antibiotics Shortness Of Breath and Palpitations  . 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 Fish Allergy Hives and Swelling    Tongue swelling  . Iodine  Swelling  . Metformin And Related Diarrhea  . [name] [Insulin Glargine-Lixisenatide] Itching and Other (See Comments)    "tongue swelling"  . Adhesive [Tape] Rash    Paper tape is ok  . Shellfish Allergy Swelling and Rash    Tongue swelling   Current Meds  Medication Sig  . acetaminophen (TYLENOL) 325 MG tablet Take 2 tablets (650 mg total) by mouth every 6 (six) hours as needed for mild pain (or Fever >/= 101).  Marland Kitchen albuterol (PROAIR HFA) 108 (90 Base) MCG/ACT inhaler 1-2 inhalations every 4-6 hours as needed for cough or wheeze. (Patient taking differently: Inhale 1-2 puffs into the lungs every 4 (four) hours as needed for shortness of breath. )  . ALPRAZolam (XANAX) 0.5 MG tablet Take 0.5 mg by mouth 2 (two) times daily as needed for anxiety.  . Azelastine HCl 0.15 % SOLN Place 2 sprays into both nostrils 2 (two) times daily. (Patient taking differently: Place 2 sprays into both nostrils 2 (two) times daily as needed (allergies). )  . bisacodyl (DULCOLAX) 5 MG EC tablet Take 5 mg by mouth daily as needed for moderate constipation.  . citalopram (CELEXA) 10 MG tablet Take 10 mg by mouth daily as needed.   . clopidogrel (PLAVIX) 75 MG tablet Take 1 tablet (75 mg total) by mouth daily.  Marland Kitchen EPINEPHrine 0.3 mg/0.3 mL IJ SOAJ injection Inject 0.3 mLs (0.3 mg total) into the muscle once. (Patient taking differently: Inject 0.3 mg into the muscle as needed for anaphylaxis. )  . Fluocinolone Acetonide Body 0.01 % OIL Apply topically 2 (two) times daily.  . insulin lispro (HUMALOG) 100 UNIT/ML injection Inject 5-12 Units into the skin 2 (two) times daily before a meal. Per sliding scale using OMNIPOD  . loperamide (IMODIUM) 2 MG capsule Take 1 capsule (2 mg total) by mouth as needed for diarrhea or loose stools.  Marland Kitchen losartan-hydrochlorothiazide (HYZAAR) 50-12.5 MG tablet Take 1 tablet by mouth daily.  . Magnesium 200 MG TABS Take 1 tablet (200 mg total) by mouth daily.  . metoCLOPramide (REGLAN) 5 MG  tablet Take 1 tablet (5 mg total) by mouth every 6 (six) hours as needed for nausea or vomiting.  . mupirocin ointment (BACTROBAN) 2 % Apply 1 application topically 2 (two) times daily. (Patient taking differently: Apply 1 application topically 2 (two) times daily as needed. )  . nitroGLYCERIN (NITROSTAT) 0.4 MG SL tablet PLACE 1 TAB UNDER TONGUE EVERY 5 MINS AS NEEDED FOR CHEST PAIN - MAX 3 DOSES THEN 911 (Patient taking differently: Place 0.4 mg under the tongue every 5 (five) minutes as needed for chest pain. MAX 3 DOSES THEN 911)  . ondansetron (ZOFRAN) 4 MG tablet Take 1 tablet (4 mg total) by mouth daily as needed for nausea or vomiting.  . pantoprazole (PROTONIX) 40 MG tablet TAKE 1 TABLET BY MOUTH EVERY DAY (Patient taking differently: Take 40 mg by mouth daily. )  . PNEUMOVAX 23 25 MCG/0.5ML injection   . polyethylene glycol (MIRALAX / GLYCOLAX) 17 g packet Take 17 g by mouth daily as needed for moderate constipation or severe constipation.  . potassium chloride (KLOR-CON) 10 MEQ tablet Take 10 mEq by mouth  daily.  . propranolol ER (INDERAL LA) 60 MG 24 hr capsule Take 1 capsule (60 mg total) by mouth every evening.  . rosuvastatin (CRESTOR) 40 MG tablet Take 1 tablet (40 mg total) by mouth every morning.  . SUMAtriptan (IMITREX) 100 MG tablet Take 100 mg by mouth every 2 (two) hours as needed for migraine.   . temazepam (RESTORIL) 30 MG capsule Take 30 mg by mouth at bedtime as needed for sleep.  . traZODone (DESYREL) 100 MG tablet Take 1 tablet (100 mg total) by mouth at bedtime.  . TRESIBA 100 UNIT/ML SOLN SMARTSIG:35 Unit(s) SUB-Q Daily   Past Medical History:  Diagnosis Date  . 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 Anemia    hx  . Angioedema 07/10/2015  . Anxiety   . Arthritis    "neck, left hand" (09/14/2012)  . Asthma   . Cancer ([place]) 1985   ovarian, no treatment except surgery  . Complication of anesthesia    OCCASIONAL TROUBLE TURNING NECK TO RIGHT  . Critical lower limb ischemia  ([place])    10/2014 s/p L SFA stenting  . Diverticulosis of colon with hemorrhage April 2013  . GERD (gastroesophageal reflux disease)   . GI bleed   . H/O hiatal hernia   . Heart attack [facility])    2003 mild MI, March 2013 mild MI  . Hidradenitis    groin  . History of blood transfusion 1985 AND 2013  . Hyperlipidemia   . Hypertension   . Irritable bowel syndrome   . Left-sided weakness    since stroke, left eye trouble seeing  . Migraines   . Mild CAD    a. Cath 09/2010: mild luminal irregularities of LAD, 30% prox RCA and 20-30% mRCA, EF 65%.  . Neuropathy   . Obesity   . PAD (peripheral artery disease) ([place])    a. critical limb ischemia s/p PTA/stenting of L SFA 10/2014. c. occ prior SFA stent by angio 01/2016, for possible PV bypass.  . Pneumonia    baby  . Recurrent upper respiratory infection (URI)   . S/P arterial stent-mid Lt SFA 11/03/14 11/04/2014  . Schatzki's ring   . Sinus problem   . Stomach ulcer 1972   non-bleeding  . Stroke ([place]) 07-2007, 07-2008, 07-2009   total 3 strokes; mild left sided weakness and left eye "jumps".  . Type II diabetes mellitus ([place])   . Vitamin B 12 deficiency 07-15-2013   Past Surgical History:  Procedure Laterality Date  . ABDOMINAL HYSTERECTOMY  1985  . ADENOIDECTOMY    . ANTERIOR CERVICAL DECOMP/DISCECTOMY FUSION  2002  . ANTERIOR CERVICAL DECOMP/DISCECTOMY FUSION N/A 04/08/2014   Procedure: Cervical Six-Seven ANTERIOR CERVICAL DECOMPRESSION/DISCECTOMY FUSION Plating and Bonegraft  2 LEVELS;  Surgeon: Ashok Pall, MD;  Location: [place] NEURO ORS;  Service: Neurosurgery;  Laterality: N/A;  Cervical Six-Seven ANTERIOR CERVICAL DECOMPRESSION/DISCECTOMY FUSION Plating and Bonegraft  2 LEVELS  . APPENDECTOMY  1985  . AXILLARY HIDRADENITIS EXCISION  1990-2008   bilateral  . BACK SURGERY    . BREAST BIOPSY Right 2007  . BREAST CYST EXCISION Right 2008  . BREAST REDUCTION SURGERY    . CARDIAC CATHETERIZATION  2004   mild disease  . CATARACT  EXTRACTION W/PHACO Left 05/16/2015   Procedure: CATARACT EXTRACTION PHACO AND INTRAOCULAR LENS PLACEMENT (IOC);  Surgeon: Rutherford Guys, MD;  [facility];  Service: Ophthalmology;  Laterality: Left;  CDE: 4.24  . CATARACT EXTRACTION W/PHACO Right 05/30/2015   Procedure: CATARACT EXTRACTION RIGHT EYE  PHACO AND INTRAOCULAR LENS PLACEMENT ;  Surgeon: Rutherford Guys, MD;  Location: [facility];  Service: Ophthalmology;  Laterality: Right;  CDE:4.08  . CHOLECYSTECTOMY  1990's  . COLONOSCOPY  08/12/2011   Procedure: COLONOSCOPY;  Surgeon: Ladene Artist, MD,FACG;  Location: [facility] ENDOSCOPY;  Service: Endoscopy;  Laterality: N/A;  . COLONOSCOPY  06/19/2006  . COLONOSCOPY WITH PROPOFOL N/A 07/02/2019   Procedure: COLONOSCOPY WITH PROPOFOL;  Surgeon: Rogene Houston, MD;  [facility];  Service: Endoscopy;  Laterality: N/A;  . cyst thigh Right   . ESOPHAGOGASTRODUODENOSCOPY  08/12/2011   Procedure: ESOPHAGOGASTRODUODENOSCOPY (EGD);  Surgeon: Ladene Artist, MD,FACG;  Location: [facility] ENDOSCOPY;  Service: Endoscopy;  Laterality: N/A;  . ESOPHAGOGASTRODUODENOSCOPY  06/04/2005  . ESOPHAGOGASTRODUODENOSCOPY (EGD) WITH PROPOFOL N/A 07/01/2019   Procedure: ESOPHAGOGASTRODUODENOSCOPY (EGD) WITH PROPOFOL;  Surgeon: Rogene Houston, MD;  [facility];  Service: Endoscopy;  Laterality: N/A;  . FEMORAL-POPLITEAL BYPASS GRAFT Left 06/19/2016   Procedure: Left Leg BYPASS GRAFT FEMORAL-POPLITEAL ARTERY;  Surgeon: Serafina Mitchell, MD;  Location: [place];  Service: Vascular;  Laterality: Left;  . GIVENS CAPSULE STUDY  08/13/2011   Procedure: GIVENS CAPSULE STUDY;  Surgeon: Ladene Artist, MD,FACG;  Location: [facility] ENDOSCOPY;  Service: Endoscopy;  Laterality: N/A;  . HAMMER TOE SURGERY Bilateral ~ 2000  . HEMOSTASIS CLIP PLACEMENT  07/02/2019   Procedure: HEMOSTASIS CLIP PLACEMENT;  Surgeon: Rogene Houston, MD;  [facility];  Service: Endoscopy;;  hepatic flexure  . HIATAL HERNIA REPAIR    . HYDRADENITIS  EXCISION  01/2011; 03/2012   'groin and abdomen; 03/2012" (09/14/2012)  . HYDRADENITIS EXCISION  04/01/2012   Procedure: EXCISION HYDRADENITIS GROIN;  Surgeon: Pedro Earls, MD;  [facility]: [facility];  Service: General;  Laterality: Bilateral;  Excision of Hydradenitis of Perineum  . HYDRADENITIS EXCISION N/A 09/17/2013   Procedure: EXCISION PERINEAL HIDRADENITIS ;  Surgeon: Pedro Earls, MD;  [facility]: [facility];  Service: General;  Laterality: N/A;  also in the pubis area  . LEFT HEART CATH AND CORONARY ANGIOGRAPHY N/A 12/26/2016   Procedure: LEFT HEART CATH AND CORONARY ANGIOGRAPHY;  Surgeon: [name], Peter M, MD;  Location: [place] CV LAB;  Service: Cardiovascular;  Laterality: N/A;  . MASS EXCISION Right 09/17/2013   Procedure: EXCISION MASS;  Surgeon: Pedro Earls, MD;  Location: [facility];  Service: General;  Laterality: Right;  . NISSEN FUNDOPLICATION  6283  . PERIPHERAL VASCULAR CATHETERIZATION N/A 11/03/2014   Procedure: Lower Extremity Angiography;  Surgeon: Lorretta Harp, MD;  Location: [place] CV LAB;  Service: Cardiovascular;  Laterality: N/A;  . PERIPHERAL VASCULAR CATHETERIZATION N/A 01/29/2016   Procedure: Lower Extremity Angiography;  Surgeon: Lorretta Harp, MD;  Location: [place] CV LAB;  Service: Cardiovascular;  Laterality: N/A;  . POLYPECTOMY  07/02/2019   Procedure: POLYPECTOMY;  Surgeon: Rogene Houston, MD;  [facility];  Service: Endoscopy;;  . POSTERIOR LUMBAR FUSION  2008 X 2  . REDUCTION MAMMAPLASTY  1996?  . TONSILLECTOMY AND ADENOIDECTOMY  1959 AND 2000  . 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  Divorced, living alone in [place].     family history includes Allergic rhinitis in her sister; Aneurysm in her sister; Breast cancer in her  mother; Diabetes in her father and sister; Emphysema  in an other family member; Heart disease in her father, mother, and sister; Hyperlipidemia in her sister; Hypertension in her mother, sister, and sister; Stroke in her father.   Review of Systems As above  Objective:   Physical Exam BP 130/68   Pulse 82   Ht 5' 3.5" (1.613 m)   Wt 132 lb (59.9 kg)   BMI 23.02 kg/m  Thin black woman in no acute distress The abdomen is slightly protuberant bowel sounds are increased it is soft with mild tenderness in the epigastrium without organomegaly or mass She is alert and oriented x3 She has an appropriate mood and affect

## 2020-02-10 ENCOUNTER — Other Ambulatory Visit: Payer: Self-pay

## 2020-02-10 ENCOUNTER — Ambulatory Visit (HOSPITAL_COMMUNITY)
Admission: RE | Admit: 2020-02-10 | Discharge: 2020-02-10 | Disposition: A | Discharge status: Home / Self Care | Payer: Medicare Other | Source: Ambulatory Visit | Attending: Internal Medicine | Admitting: Internal Medicine

## 2020-02-10 DIAGNOSIS — R197 Diarrhea, unspecified: Secondary | ICD-10-CM | POA: Diagnosis not present

## 2020-02-10 DIAGNOSIS — R110 Nausea: Secondary | ICD-10-CM | POA: Diagnosis not present

## 2020-02-10 DIAGNOSIS — R140 Abdominal distension (gaseous): Secondary | ICD-10-CM | POA: Diagnosis not present

## 2020-02-10 DIAGNOSIS — R634 Abnormal weight loss: Secondary | ICD-10-CM | POA: Diagnosis not present

## 2020-02-10 LAB — POCT I-STAT CREATININE: Creatinine, Ser: 0.7 mg/dL (ref 0.44–1.00)

## 2020-02-10 MED ORDER — IOHEXOL 300 MG/ML  SOLN
100.0000 mL | Freq: Once | INTRAMUSCULAR | Status: AC | PRN
  Administered 2020-02-10: 100 mL via INTRAVENOUS

## 2020-02-11 ENCOUNTER — Telehealth: Payer: Self-pay | Admitting: Internal Medicine

## 2020-02-11 ENCOUNTER — Other Ambulatory Visit: Payer: Self-pay | Admitting: *Deleted

## 2020-02-11 DIAGNOSIS — E1165 Type 2 diabetes mellitus with hyperglycemia: Secondary | ICD-10-CM | POA: Diagnosis not present

## 2020-02-11 NOTE — Telephone Encounter (Signed)
Please tell her that the x-ray she did when she was here in the CT scan do not show any obstruction or new issues.  I continue to think the problem is pelvic floor issues and the physical therapy is the next step.  In the meantime she can try to use 1 or 2 Fleet enemas every few days if needed to try to empty her bowels.  She has a November for appointment with me but I cannot imagine I am going to do anything different, she starts physical therapy on November 8.   I need her to do that before we do a follow-up so I would schedule something in mid or late December.  Getting better control of her diabetes as is planned with an endocrinology visit is critical as well.

## 2020-02-11 NOTE — Telephone Encounter (Signed)
Spoke with pt regarding results. Pt states that she has bruising on her abdomen that has been there and has had no relief with a BM. Pt does not report any injury to the abdomen. She states that she tried the GI cocktail, she states that she had 8 capfuls of Miralax and 4 capsules of Dulcolax earlier this week with no relief. She states that she does not think the enemas with provide any relief, she states that she feels like she has an 8 lb baby inside of her.Pt states that this is beginning to make nauseas.  Please advise, thanks

## 2020-02-11 NOTE — Patient Outreach (Addendum)
[name] Care Management  02/11/2020  [name] 06/09/1955 [id number]   [facility] outreach complex care   [name] was referred to [facility] on 08/02/19 by [facility] hospital liaison, B Smith Referral Reason:Post hospital follow up Diagnoses of Diabetes,CKD Please refer to community telephonic RN for care management services. Insurance:[employer] medicare  Last admission 07/28/19 to 07/30/19  Outreach to patient after a voice message was left by her  Patient is able to verify HIPAA ([name]) identifiers Reviewed and addressed the purpose of the follow up call with the patient  Consent: [name]) RN CM reviewed [name] services with patient. Patient gave verbal consent for services.   Follow up  Gastrointestinal symptoms [name] reports continuing abdominal pain on left side with bruised area She reports not having a good formed stool "only air" and feeling "stressed" related to this chronic medical issue  Her 02/10/20 CT abdomen indicates -Stable small left upper quadrant interparietal abdominal wall hernia containing only fat. She was encouraged to outreach to Dr Carlean Purl to discuss the results, report the continued symptoms and to inquire about further treatment plan [facility] RN CM also counsel on the intake of ice cream and referred her to the previous EMMI education sent to her on IBS diet, diabetic meal planning, food allergy, and high fiber diet [name] RN CM discussed again the importance of attending pending pelvic floor therapy to assist with her chronic medical concern She and [facility] RN CM discussed action plan of outreach to medical providers (pcp and gastroenterologist), to follow treatment recommendations for bowel elimination offered, to read information on abdominal wall hernia, to work of stress relief and to seek emergency services if symptoms increase during the weekend. She reports she  has a history of not having a stool in "a week"  She also reports consulting with her niece "in medical school" who also is reported to confirm the same treatment plans of her medical providers and [facility] RN CM   Her action plan for her stress includes a prescription for xanax that she reports she does not like taking  She states she has starting to take her prescribed sleep medicine She reports she is still not sleeping well and as of this time had about  2 hours of sleep  Diabetes  She reports her dexacom has been approved and she was informed she is to get the supplies in 3-5 business She was previously had a freestyle  Plan [facility] RN CM scheduled this [facility] engaged patient for another call attempt within 14-21 business daysor prn  Charday Capetillo L. Lavina Hamman, RN, BSN, [place] Coordinator Office number [phone] Main [facility] number [phone] Fax number [phone]

## 2020-02-11 NOTE — Telephone Encounter (Signed)
Pt is requesting a call back to discuss the results from the CT scan.

## 2020-02-11 NOTE — Telephone Encounter (Signed)
Spoke with patient, she is wanting Dr. Carlean Purl to review her CT scan results and x-ray. She states that she is still constipated. Pt states that she would like a call back before Friday is over. Please advise, thank you

## 2020-02-14 ENCOUNTER — Other Ambulatory Visit: Payer: Self-pay | Admitting: *Deleted

## 2020-02-14 MED ORDER — METOCLOPRAMIDE HCL 5 MG PO TABS: 5.0000 mg | ORAL_TABLET | Freq: Three times a day (TID) | ORAL | 0 refills | Status: DC

## 2020-02-14 NOTE — Telephone Encounter (Signed)
Patient called to follow up on GI cocktail and she stated she was only able to have one BM all weekend

## 2020-02-14 NOTE — Telephone Encounter (Signed)
I was off in the afternoon when the second message came back.  Unfortunately I do not have any other diagnostic or therapeutic recommendations for her.  The x-ray and the CT scan do not reveal any problems that would explain her symptoms.  I think that her hyperglycemia with diabetes and nerve dysfunction in the guts are part of her problem.  If she is having bruising in the abdominal wall (no hematomas seen on CT scan) she can see primary care.  1 thing we could try again would be low-dose metoclopramide.  She had some diarrhea in the past with that so she stopped it based upon repeat chart review   Lets have her try 5 mg before every meal #90 no refill  We can see how that is going at her November for follow-up, initially I thought we should not keep that since we are waiting on pelvic floor PT but since trying the metoclopramide we can keep that

## 2020-02-14 NOTE — Telephone Encounter (Signed)
Patient notified of the recommendations and will try reglan.  She will keep her appt for 11/4

## 2020-02-14 NOTE — Patient Outreach (Addendum)
[name] [facility]) Care Management  02/14/2020  [name] 1956-04-07 [id number]   [place] coordination- Bowel elimination  [name] RN CM received a voice message from patient stating she had completed recommended enema without bowel elimination success "I did the procedures but still having complication"  [facility] RN CM sent an Epic in basket message to [name] gastroenterologist  Updating him of pt voiced concern and left a message for Jennifer Sousa, NP/PA at Jennifer [place] office updating her of pt voiced concern  Outreach reach to patient Patient is able to verify HIPAA ([place] and Accountability Act) identifiers Reviewed and addressed the purpose of the follow up call with the patient  Consent: [facility] RN CM reviewed [facility] services with patient. Patient gave verbal consent for services.  She discussed her attempts to have a stool without success Having only the clear liquid from the enema and flatus Report bloating, bruising of abdomen and increase "heartburn" She reports she has increased her gastric reflux medication to attempt to help "I'm tired"   [facility] RN CM reviewed with her standard treatments for adult constipation to include activity, fluids (she reports drinking lots of water), stool softener, bowel elimination medicines (enema, "bowel purge"), high fiber diet , etc. She reports she has completed all of these She has read recommended educational information  Stress discussed She reports she is having stress related to relationship with family members She reports she had hospice services [name]) counseling since 2000 & 2007 at intervals and knows these services are available She is keeps a journal, speak with others at intervals goes to church services and follows the treatment plan offered by [name] She is attempting to work on boundaries She attended church services on 02/13/20 Patient ventilated her feelings regarding her family  concerns, hernia she was notified of and not having a stool She is still not getting much sleep Empathy, support and encouragement provided Patient voices appreciation for [facility] RN CM outreach   [phone] outreach and response from gastroenterologist, [name], Going to try Reglan I have tried most of what I know to do Imaging is reassuring  Plan [facility] RN CM scheduled this [facility] engaged patient for another call attempt within 14-21 business daysor prn Routed note to [employer] L. Lavina Hamman, RN, BSN, [place] Coordinator Office number [phone] Mobile number [phone]  Main [name] number [phone] Fax number [phone]

## 2020-02-16 ENCOUNTER — Other Ambulatory Visit: Payer: Self-pay | Admitting: *Deleted

## 2020-02-16 NOTE — Patient Outreach (Addendum)
[facility]) Care Management  02/16/2020  DANEESHA QUINTEROS 1955-05-17 [id number]   [facility] outreach complex care   [name] was referred to [facility] on 08/02/19 by [facility] hospital liaison, B Smith Referral Reason:Post hospital follow up Diagnoses of Diabetes,CKD Please refer to community telephonic RN for care management services. Insurance:[employer] medicare  Last admission 07/28/19 to 07/30/19  Outreach to patient after a voice message was left by her  Patient is able to verify HIPAA ([place]) identifiers Consent: [name] RN CM reviewed [place] with patient. Patient gave verbal consent for services.   Follow up  Gastrointestinal symptoms Mrs Imber reports she still has not had a Stool at this time She has outreached to her pcp office and has been provided [place] Encouragement provided Reviewed bowel elimination home interventions, high fiber, fluids, stool softener, activity, massage of abdomen, recommended medicines, updating MDs etc  Plan [facility] RN CM scheduled this [facility] engaged patient for another call attempt within7-14business daysor prn  Shawntay Prest L. Lavina Hamman, RN, BSN, [place] Coordinator Office number [phone] [facility] number [phone] Fax number [phone]

## 2020-02-18 ENCOUNTER — Other Ambulatory Visit: Payer: Self-pay

## 2020-02-18 ENCOUNTER — Ambulatory Visit (HOSPITAL_COMMUNITY)
Admission: RE | Admit: 2020-02-18 | Discharge: 2020-02-18 | Disposition: A | Discharge status: Home / Self Care | Payer: Medicare Other | Source: Ambulatory Visit | Attending: Internal Medicine | Admitting: Internal Medicine

## 2020-02-18 ENCOUNTER — Other Ambulatory Visit (HOSPITAL_COMMUNITY): Payer: Self-pay | Admitting: Internal Medicine

## 2020-02-18 DIAGNOSIS — K5900 Constipation, unspecified: Secondary | ICD-10-CM | POA: Insufficient documentation

## 2020-02-18 DIAGNOSIS — R109 Unspecified abdominal pain: Secondary | ICD-10-CM | POA: Diagnosis not present

## 2020-02-20 ENCOUNTER — Emergency Department (HOSPITAL_COMMUNITY): Payer: Medicare Other

## 2020-02-20 ENCOUNTER — Other Ambulatory Visit: Payer: Self-pay

## 2020-02-20 ENCOUNTER — Emergency Department (HOSPITAL_COMMUNITY)
Admission: EM | Admit: 2020-02-20 | Discharge: 2020-02-21 | Disposition: A | Discharge status: Home / Self Care | Payer: Medicare Other | Attending: Emergency Medicine | Admitting: Emergency Medicine

## 2020-02-20 ENCOUNTER — Encounter (HOSPITAL_COMMUNITY): Payer: Self-pay | Admitting: Emergency Medicine

## 2020-02-20 DIAGNOSIS — R1011 Right upper quadrant pain: Secondary | ICD-10-CM | POA: Diagnosis not present

## 2020-02-20 DIAGNOSIS — I10 Essential (primary) hypertension: Secondary | ICD-10-CM | POA: Insufficient documentation

## 2020-02-20 DIAGNOSIS — E1143 Type 2 diabetes mellitus with diabetic autonomic (poly)neuropathy: Secondary | ICD-10-CM | POA: Insufficient documentation

## 2020-02-20 DIAGNOSIS — J45909 Unspecified asthma, uncomplicated: Secondary | ICD-10-CM | POA: Diagnosis not present

## 2020-02-20 DIAGNOSIS — R109 Unspecified abdominal pain: Secondary | ICD-10-CM

## 2020-02-20 DIAGNOSIS — Z79899 Other long term (current) drug therapy: Secondary | ICD-10-CM | POA: Diagnosis not present

## 2020-02-20 DIAGNOSIS — R1012 Left upper quadrant pain: Secondary | ICD-10-CM | POA: Diagnosis not present

## 2020-02-20 DIAGNOSIS — I2510 Atherosclerotic heart disease of native coronary artery without angina pectoris: Secondary | ICD-10-CM | POA: Diagnosis not present

## 2020-02-20 DIAGNOSIS — Z8543 Personal history of malignant neoplasm of ovary: Secondary | ICD-10-CM | POA: Insufficient documentation

## 2020-02-20 DIAGNOSIS — F17210 Nicotine dependence, cigarettes, uncomplicated: Secondary | ICD-10-CM | POA: Diagnosis not present

## 2020-02-20 DIAGNOSIS — R1110 Vomiting, unspecified: Secondary | ICD-10-CM | POA: Diagnosis not present

## 2020-02-20 DIAGNOSIS — R197 Diarrhea, unspecified: Secondary | ICD-10-CM | POA: Diagnosis not present

## 2020-02-20 DIAGNOSIS — K5900 Constipation, unspecified: Secondary | ICD-10-CM | POA: Diagnosis not present

## 2020-02-20 DIAGNOSIS — R112 Nausea with vomiting, unspecified: Secondary | ICD-10-CM | POA: Diagnosis not present

## 2020-02-20 DIAGNOSIS — Z794 Long term (current) use of insulin: Secondary | ICD-10-CM | POA: Insufficient documentation

## 2020-02-20 DIAGNOSIS — R110 Nausea: Secondary | ICD-10-CM | POA: Diagnosis not present

## 2020-02-20 LAB — COMPREHENSIVE METABOLIC PANEL
ALT: 16 U/L (ref 0–44)
AST: 17 U/L (ref 15–41)
Albumin: 3.9 g/dL (ref 3.5–5.0)
Alkaline Phosphatase: 60 U/L (ref 38–126)
Anion gap: 9 (ref 5–15)
BUN: 6 mg/dL — ABNORMAL LOW (ref 8–23)
CO2: 26 mmol/L (ref 22–32)
Calcium: 9.8 mg/dL (ref 8.9–10.3)
Chloride: 103 mmol/L (ref 98–111)
Creatinine, Ser: 0.66 mg/dL (ref 0.44–1.00)
GFR, Estimated: >60 mL/min (ref >60)
Glucose, Bld: 176 mg/dL — ABNORMAL HIGH (ref 70–99)
Potassium: 3.2 mmol/L — ABNORMAL LOW (ref 3.5–5.1)
Sodium: 138 mmol/L (ref 135–145)
Total Bilirubin: 1 mg/dL (ref 0.3–1.2)
Total Protein: 7.1 g/dL (ref 6.5–8.1)

## 2020-02-20 LAB — URINALYSIS, ROUTINE W REFLEX MICROSCOPIC
Bilirubin Urine: NEGATIVE
Glucose, UA: >=500 mg/dL — AB
Hgb urine dipstick: NEGATIVE
Ketones, ur: NEGATIVE mg/dL
Leukocytes,Ua: NEGATIVE
Nitrite: NEGATIVE
Protein, ur: 30 mg/dL — AB
Specific Gravity, Urine: 1.027 (ref 1.005–1.030)
pH: 6 (ref 5.0–8.0)

## 2020-02-20 LAB — CBC
HCT: 39 % (ref 36.0–46.0)
Hemoglobin: 12.2 g/dL (ref 12.0–15.0)
MCH: 27.7 pg (ref 26.0–34.0)
MCHC: 31.3 g/dL (ref 30.0–36.0)
MCV: 88.6 fL (ref 80.0–100.0)
Platelets: 260 10*3/uL (ref 150–400)
RBC: 4.4 MIL/uL (ref 3.87–5.11)
RDW: 14.2 % (ref 11.5–15.5)
WBC: 5.6 10*3/uL (ref 4.0–10.5)
nRBC: 0.4 % — ABNORMAL HIGH (ref 0.0–0.2)

## 2020-02-20 LAB — LIPASE, BLOOD: Lipase: 39 U/L (ref 11–51)

## 2020-02-20 MED ORDER — ONDANSETRON HCL 4 MG/2ML IJ SOLN
4.0000 mg | Freq: Once | INTRAMUSCULAR | Status: AC
  Administered 2020-02-20: 4 mg via INTRAVENOUS
  Filled 2020-02-20: qty 2

## 2020-02-20 MED ORDER — POTASSIUM CHLORIDE CRYS ER 20 MEQ PO TBCR
40.0000 meq | EXTENDED_RELEASE_TABLET | Freq: Once | ORAL | Status: AC
  Administered 2020-02-20: 40 meq via ORAL
  Filled 2020-02-20: qty 2

## 2020-02-20 MED ORDER — IOHEXOL 300 MG/ML  SOLN
100.0000 mL | Freq: Once | INTRAMUSCULAR | Status: AC | PRN
  Administered 2020-02-20: 100 mL via INTRAVENOUS

## 2020-02-20 MED ORDER — SODIUM CHLORIDE 0.9 % IV BOLUS
1000.0000 mL | Freq: Once | INTRAVENOUS | Status: AC
  Administered 2020-02-20: 1000 mL via INTRAVENOUS

## 2020-02-20 MED ORDER — FENTANYL CITRATE (PF) 100 MCG/2ML IJ SOLN
50.0000 ug | Freq: Once | INTRAMUSCULAR | Status: AC
  Administered 2020-02-20: 50 ug via INTRAVENOUS
  Filled 2020-02-20: qty 2

## 2020-02-20 NOTE — ED Triage Notes (Signed)
Pt arrives from home via POV c/o difficulty having a bowel movement for 2 weeks. Pt reports prepping for a colonoscopy on Friday and drinking the liquid she was provided and has only had liquid stools. No hardened stools have been noted by the Pt at home. Pt also reports being on a blood thinner and is c/o bilateral abdominal bruising.

## 2020-02-20 NOTE — ED Provider Notes (Signed)
[facility] EMERGENCY DEPARTMENT Provider Note   CSN: [id number] Arrival date & time: 02/20/20  2017     History Chief Complaint  Patient presents with  . Constipation    [name] is a 64 y.o. female.  HPI 64 year old female presents with abdominal pain and trouble with bowel movements.  For 2 weeks she has been having constipation issues and abdominal pain.  Is mostly left-sided.  Saw her gastroenterologist who ordered an x-ray.  She has been having Dulcolax and MiraLAX multiple times.  She is having a lot of liquid stools but no formed bowel movements.  Had a CT a week ago and then a couple days ago had an x-ray that showed a large amount of stool.  She was put on GoLYTELY by her PCP.  She is now having a lot of liquid stool but no formed bowel movements and continues to have the abdominal pain.  Now she is having nausea without vomiting.  No chest pain.   Past Medical History:  Diagnosis Date  . 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 Anemia    hx  . Angioedema 07/10/2015  . Anxiety   . Arthritis    "neck, left hand" (09/14/2012)  . Asthma   . Cancer ([place]) 1985   ovarian, no treatment except surgery  . Complication of anesthesia    OCCASIONAL TROUBLE TURNING NECK TO RIGHT  . Critical lower limb ischemia ([place])    10/2014 s/p L SFA stenting  . Diverticulosis of colon with hemorrhage April 2013  . GERD (gastroesophageal reflux disease)   . GI bleed   . H/O hiatal hernia   . Heart attack [facility])    2003 mild MI, March 2013 mild MI  . Hidradenitis    groin  . History of blood transfusion 1985 AND 2013  . Hyperlipidemia   . Hypertension   . Irritable bowel syndrome   . Left-sided weakness    since stroke, left eye trouble seeing  . Migraines   . Mild CAD    a. Cath 09/2010: mild luminal irregularities of LAD, 30% prox RCA and 20-30% mRCA, EF 65%.  . Neuropathy   . Obesity   . PAD (peripheral artery disease) ([place])    a. critical limb ischemia s/p PTA/stenting of L SFA  10/2014. c. occ prior SFA stent by angio 01/2016, for possible PV bypass.  . Pneumonia    baby  . Recurrent upper respiratory infection (URI)   . S/P arterial stent-mid Lt SFA 11/03/14 11/04/2014  . Schatzki's ring   . Sinus problem   . Stomach ulcer 1972   non-bleeding  . Stroke ([place]) 07-2007, 07-2008, 07-2009   total 3 strokes; mild left sided weakness and left eye "jumps".  . Type II diabetes mellitus ([place])   . Vitamin B 12 deficiency 07-15-2013    Patient Active Problem List   Diagnosis Date Noted  . Generalized abdominal pain 12/15/2019  . AKI (acute kidney injury) ([place]) 07/28/2019  . GI bleed 06/30/2019  . Hypokalemia 06/28/2019  . Generalized weakness 06/28/2019  . Syncope and collapse 06/20/2019  . Tobacco abuse 05/04/2019  . Diabetic gastroparesis associated with type 2 diabetes mellitus ([place]) 11/25/2017  . Precordial chest pain 12/24/2016  . Nausea & vomiting 12/24/2016  . PAD (peripheral artery disease) ([place]) 06/19/2016  . Diabetic neuropathy ([place]) 01/31/2016  . Obesity 01/31/2016  . Mild CAD   . Claudication ([place]) 01/29/2016  . Food allergy 08/02/2015  . Recurrent urticaria 07/10/2015  . Angioedema 07/10/2015  .  Perennial allergic rhinitis with a possible non-allergic component 07/10/2015  . History of asthma 07/10/2015  . S/P femoral-popliteal bypass surgery 11/04/2014  . Critical lower limb ischemia ([place]) 11/03/2014  . Peripheral arterial disease ([place]) 11/02/2014  . HNP (herniated nucleus pulposus) with myelopathy, cervical 04/11/2014  . HNP (herniated nucleus pulposus), cervical 04/08/2014  . Pancreatitis 01/02/2013  . Abdominal pain, epigastric 12/14/2012  . IBS (irritable bowel syndrome) 09/12/2011  . GERD (gastroesophageal reflux disease) 09/12/2011  . Diverticulosis of colon with hemorrhage 08/10/2011  . Chest pain at rest 07/06/2011    Class: Acute  . Hidradenitis, left pubic area and left lower quadrant 01/18/2011  . DM2 (diabetes mellitus, type 2)  ([place]) 08/24/2007  . Hyperlipidemia LDL goal <70 08/24/2007  . Essential hypertension 08/24/2007    Past Surgical History:  Procedure Laterality Date  . ABDOMINAL HYSTERECTOMY  1985  . ADENOIDECTOMY    . ANTERIOR CERVICAL DECOMP/DISCECTOMY FUSION  2002  . ANTERIOR CERVICAL DECOMP/DISCECTOMY FUSION N/A 04/08/2014   Procedure: Cervical Six-Seven ANTERIOR CERVICAL DECOMPRESSION/DISCECTOMY FUSION Plating and Bonegraft  2 LEVELS;  Surgeon: Ashok Pall, MD;  Location: [place] [facility];  Service: Neurosurgery;  Laterality: N/A;  Cervical Six-Seven ANTERIOR CERVICAL DECOMPRESSION/DISCECTOMY FUSION Plating and Bonegraft  2 LEVELS  . APPENDECTOMY  1985  . AXILLARY HIDRADENITIS EXCISION  1990-2008   bilateral  . BACK SURGERY    . BREAST BIOPSY Right 2007  . BREAST CYST EXCISION Right 2008  . BREAST REDUCTION SURGERY    . CARDIAC CATHETERIZATION  2004   mild disease  . CATARACT EXTRACTION W/PHACO Left 05/16/2015   Procedure: CATARACT EXTRACTION PHACO AND INTRAOCULAR LENS PLACEMENT (IOC);  Surgeon: Rutherford Guys, MD;  Location: [facility];  Service: Ophthalmology;  Laterality: Left;  CDE: 4.24  . CATARACT EXTRACTION W/PHACO Right 05/30/2015   Procedure: CATARACT EXTRACTION RIGHT EYE PHACO AND INTRAOCULAR LENS PLACEMENT ;  Surgeon: Rutherford Guys, MD;  Location: [facility];  Service: Ophthalmology;  Laterality: Right;  CDE:4.08  . CHOLECYSTECTOMY  1990's  . COLONOSCOPY  08/12/2011   Procedure: COLONOSCOPY;  Surgeon: Ladene Artist, MD,FACG;  [facility];  Service: Endoscopy;  Laterality: N/A;  . COLONOSCOPY  06/19/2006  . COLONOSCOPY WITH PROPOFOL N/A 07/02/2019   Procedure: COLONOSCOPY WITH PROPOFOL;  Surgeon: Rogene Houston, MD;  [facility];  Service: Endoscopy;  Laterality: N/A;  . cyst thigh Right   . ESOPHAGOGASTRODUODENOSCOPY  08/12/2011   Procedure: ESOPHAGOGASTRODUODENOSCOPY (EGD);  Surgeon: Ladene Artist, MD,FACG;  Location: [facility] ENDOSCOPY;  Service: Endoscopy;  Laterality: N/A;  .  ESOPHAGOGASTRODUODENOSCOPY  06/04/2005  . ESOPHAGOGASTRODUODENOSCOPY (EGD) WITH PROPOFOL N/A 07/01/2019   Procedure: ESOPHAGOGASTRODUODENOSCOPY (EGD) WITH PROPOFOL;  Surgeon: Rogene Houston, MD;  [facility];  Service: Endoscopy;  Laterality: N/A;  . FEMORAL-POPLITEAL BYPASS GRAFT Left 06/19/2016   Procedure: Left Leg BYPASS GRAFT FEMORAL-POPLITEAL ARTERY;  Surgeon: Serafina Mitchell, MD;  Location: [facility];  Service: Vascular;  Laterality: Left;  . GIVENS CAPSULE STUDY  08/13/2011   Procedure: GIVENS CAPSULE STUDY;  Surgeon: Ladene Artist, MD,FACG;  Location: [facility] ENDOSCOPY;  Service: Endoscopy;  Laterality: N/A;  . HAMMER TOE SURGERY Bilateral ~ 2000  . HEMOSTASIS CLIP PLACEMENT  07/02/2019   Procedure: HEMOSTASIS CLIP PLACEMENT;  Surgeon: Rogene Houston, MD;  [facility];  Service: Endoscopy;;  hepatic flexure  . HIATAL HERNIA REPAIR    . HYDRADENITIS EXCISION  01/2011; 03/2012   'groin and abdomen; 03/2012" (09/14/2012)  . HYDRADENITIS EXCISION  04/01/2012  Procedure: EXCISION HYDRADENITIS GROIN;  Surgeon: Pedro Earls, MD;  Location: [facility]S;  Service: General;  Laterality: Bilateral;  Excision of Hydradenitis of Perineum  . HYDRADENITIS EXCISION N/A 09/17/2013   Procedure: EXCISION PERINEAL HIDRADENITIS ;  Surgeon: Pedro Earls, MD;  Location: [facility];  Service: General;  Laterality: N/A;  also in the pubis area  . LEFT HEART CATH AND CORONARY ANGIOGRAPHY N/A 12/26/2016   Procedure: LEFT HEART CATH AND CORONARY ANGIOGRAPHY;  Surgeon: [name], Peter M, MD;  Location: [place] CV LAB;  Service: Cardiovascular;  Laterality: N/A;  . MASS EXCISION Right 09/17/2013   Procedure: EXCISION MASS;  Surgeon: Pedro Earls, MD;  Location: [facility];  Service: General;  Laterality: Right;  . NISSEN FUNDOPLICATION  4496  . PERIPHERAL VASCULAR CATHETERIZATION N/A 11/03/2014   Procedure: Lower Extremity Angiography;  Surgeon: Lorretta Harp, MD;  Location: [place] CV LAB;   Service: Cardiovascular;  Laterality: N/A;  . PERIPHERAL VASCULAR CATHETERIZATION N/A 01/29/2016   Procedure: Lower Extremity Angiography;  Surgeon: Lorretta Harp, MD;  Location: [place] CV LAB;  Service: Cardiovascular;  Laterality: N/A;  . POLYPECTOMY  07/02/2019   Procedure: POLYPECTOMY;  Surgeon: Rogene Houston, MD;  [facility];  Service: Endoscopy;;  . POSTERIOR LUMBAR FUSION  2008 X 2  . REDUCTION MAMMAPLASTY  1996?  . TONSILLECTOMY AND ADENOIDECTOMY  1959 AND 2000  . UVULOPALATOPHARYNGOPLASTY, TONSILLECTOMY AND SEPTOPLASTY  2000's     OB History   No obstetric history on file.     Family History  Problem Relation Age of Onset  . Breast cancer Mother   . Heart disease Mother   . Hypertension Mother   . Diabetes Father   . Heart disease Father   . Stroke Father   . Heart disease Sister   . Hypertension Sister   . Hypertension Sister   . Hyperlipidemia Sister   . Diabetes Sister   . Allergic rhinitis Sister   . Emphysema Other        great uncle  . Aneurysm Sister        brain  . Angioedema Neg Hx   . Asthma Neg Hx   . Eczema Neg Hx   . Immunodeficiency Neg Hx   . Urticaria Neg Hx     Social History   Tobacco Use  . Smoking status: Current Every Day Smoker    Packs/day: 0.50    Years: 41.00    Pack years: 20.50    Types: Cigarettes    Last attempt to quit: 01/15/2016    Years since quitting: 4.1  . Smokeless tobacco: Never Used  . Tobacco comment: Getting ready to start nicotine patches RX by Dr. Gwenlyn Found per pt.  Vaping Use  . Vaping Use: Never used  Substance Use Topics  . Alcohol use: No    Alcohol/week: 0.0 standard drinks  . Drug use: No    Types: "Crack" cocaine    Comment: 05/09/2015.  "quit 07/27/1994"    Home Medications Prior to Admission medications   Medication Sig Start Date End Date Taking? Authorizing Provider  acetaminophen (TYLENOL) 325 MG tablet Take 2 tablets (650 mg total) by mouth every 6 (six) hours as needed for  mild pain (or Fever >/= 101). 07/02/19   Emokpae, Courage, MD  albuterol (PROAIR HFA) 108 (90 Base) MCG/ACT inhaler 1-2 inhalations every 4-6 hours as needed for cough or wheeze. Patient taking differently: Inhale 1-2 puffs into the lungs every 4 (four) hours  as needed for shortness of breath.  07/10/15   Bobbitt, Sed[place] Muta, MD  ALPRAZolam Duanne Moron) 0.5 MG tablet Take 0.5 mg by mouth 2 (two) times daily as needed for anxiety.    [provider]  Azelastine HCl 0.15 % SOLN Place 2 sprays into both nostrils 2 (two) times daily. Patient taking differently: Place 2 sprays into both nostrils 2 (two) times daily as needed (allergies).  07/10/15   Bobbitt, [place] Muta, MD  bisacodyl (DULCOLAX) 5 MG EC tablet Take 5 mg by mouth daily as needed for moderate constipation. 08/19/19   Gatha Mayer, MD  citalopram (CELEXA) 10 MG tablet Take 10 mg by mouth daily as needed.     [provider]  clopidogrel (PLAVIX) 75 MG tablet Take 1 tablet (75 mg total) by mouth daily. 07/03/19   Roxan Hockey, MD  EPINEPHrine 0.3 mg/0.3 mL IJ SOAJ injection Inject 0.3 mLs (0.3 mg total) into the muscle once. Patient taking differently: Inject 0.3 mg into the muscle as needed for anaphylaxis.  08/02/15   Bobbitt, Sedalia Muta, MD  Fluocinolone Acetonide Body 0.01 % OIL Apply topically 2 (two) times daily. 10/29/19   [provider]  insulin lispro (HUMALOG) 100 UNIT/ML injection Inject 5-12 Units into the skin 2 (two) times daily before a meal. Per sliding scale using OMNIPOD    [provider]  loperamide (IMODIUM) 2 MG capsule Take 1 capsule (2 mg total) by mouth as needed for diarrhea or loose stools. 07/30/19   Manuella Ghazi, Pratik D, DO  losartan-hydrochlorothiazide (HYZAAR) 50-12.5 MG tablet Take 1 tablet by mouth daily. 08/18/19   Lendon Colonel, NP  Magnesium 200 MG TABS Take 1 tablet (200 mg total) by mouth daily. 08/18/19   Lendon Colonel, NP  metoCLOPramide (REGLAN) 5 MG tablet Take 1  tablet (5 mg total) by mouth every 6 (six) hours as needed for nausea or vomiting. 08/19/19   Gatha Mayer, MD  metoCLOPramide (REGLAN) 5 MG tablet Take 1 tablet (5 mg total) by mouth 3 (three) times daily before meals. 02/14/20   Gatha Mayer, MD  mupirocin ointment (BACTROBAN) 2 % Apply 1 application topically 2 (two) times daily. Patient taking differently: Apply 1 application topically 2 (two) times daily as needed.  06/02/19   Wurst, [place], PA-C  nitroGLYCERIN (NITROSTAT) 0.4 MG SL tablet PLACE 1 TAB UNDER TONGUE EVERY 5 MINS AS NEEDED FOR CHEST PAIN - MAX 3 DOSES THEN 911 Patient taking differently: Place 0.4 mg under the tongue every 5 (five) minutes as needed for chest pain. MAX 3 DOSES THEN 911 11/30/18   Barrett, Evelene Croon, PA-C  ondansetron (ZOFRAN) 4 MG tablet Take 1 tablet (4 mg total) by mouth daily as needed for nausea or vomiting. 07/30/19 07/29/20  Manuella Ghazi, Pratik D, DO  pantoprazole (PROTONIX) 40 MG tablet TAKE 1 TABLET BY MOUTH EVERY DAY Patient taking differently: Take 40 mg by mouth daily.  01/07/17   Lorretta Harp, MD  PNEUMOVAX 23 25 MCG/0.5ML injection  09/22/19   [provider]  polyethylene glycol (MIRALAX / GLYCOLAX) 17 g packet Take 17 g by mouth daily as needed for moderate constipation or severe constipation. 08/19/19   Gatha Mayer, MD  potassium chloride (KLOR-CON) 10 MEQ tablet Take 10 mEq by mouth daily. 10/01/19   [provider]  propranolol ER (INDERAL LA) 60 MG 24 hr capsule Take 1 capsule (60 mg total) by mouth every evening. 08/18/19   Lendon Colonel, NP  rosuvastatin (CRESTOR) 40 MG tablet Take 1 tablet (40 mg total) by mouth every morning. 07/02/19   Roxan Hockey, MD  SUMAtriptan (IMITREX) 100 MG tablet Take 100 mg by mouth every 2 (two) hours as needed for migraine.     [provider]  temazepam (RESTORIL) 30 MG capsule Take 30 mg by mouth at bedtime as needed for sleep.    [provider]  traZODone (DESYREL)  100 MG tablet Take 1 tablet (100 mg total) by mouth at bedtime. 07/02/19   Roxan Hockey, MD  TRESIBA 100 UNIT/ML SOLN SMARTSIG:35 Unit(s) SUB-Q Daily 12/07/19   [provider]    Allergies    Sulfa antibiotics, Codeine, Fish allergy, Iodine, Metformin and related, Soliqua [insulin glargine-lixisenatide], Adhesive [tape], and Shellfish allergy  Review of Systems   Review of Systems  Cardiovascular: Negative for chest pain.  Gastrointestinal: Positive for abdominal pain, constipation, diarrhea and nausea. Negative for vomiting.  All other systems reviewed and are negative.   Physical Exam Updated Vital Signs BP 130/64   Pulse 73   Temp 98.7 F (37.1 C) (Oral)   Resp 17   Ht 5\' 4"  (1.626 m)   Wt 60.3 kg   SpO2 96%   BMI 22.83 kg/m   Physical Exam Vitals and nursing note reviewed.  Constitutional:      Appearance: She is well-developed.  HENT:     Head: Normocephalic and atraumatic.     Right Ear: External ear normal.     Left Ear: External ear normal.     Nose: Nose normal.  Eyes:     General:        Right eye: No discharge.        Left eye: No discharge.  Cardiovascular:     Rate and Rhythm: Normal rate and regular rhythm.     Heart sounds: Normal heart sounds.  Pulmonary:     Effort: Pulmonary effort is normal.     Breath sounds: Normal breath sounds.  Abdominal:     Palpations: Abdomen is soft.     Tenderness: There is abdominal tenderness in the right upper quadrant and left upper quadrant.  Genitourinary:    Comments: Patient defers rectal exam Skin:    General: Skin is warm and dry.  Neurological:     Mental Status: She is alert.  Psychiatric:        Mood and Affect: Mood is not anxious.     ED Results / Procedures / Treatments   Labs (all labs ordered are listed, but only abnormal results are displayed) Labs Reviewed  COMPREHENSIVE METABOLIC PANEL - Abnormal; Notable for the following components:      Result Value   Potassium 3.2 (*)      Glucose, Bld 176 (*)    BUN 6 (*)    All other components within normal limits  CBC - Abnormal; Notable for the following components:   nRBC 0.4 (*)    All other components within normal limits  URINALYSIS, ROUTINE W REFLEX MICROSCOPIC - Abnormal; Notable for the following components:   Glucose, UA >=500 (*)    Protein, ur 30 (*)    Bacteria, UA RARE (*)    All other components within normal limits  LIPASE, BLOOD    EKG None  Radiology DG Abdomen 1 View  Result Date: 02/20/2020 CLINICAL DATA:  History of constipation EXAM: ABDOMEN - 1 VIEW COMPARISON:  02/28/2020 FINDINGS: Scattered large and small bowel gas is noted. No significant retained fecal material  is seen consistent with the recent colonoscopy prep. Aortic calcifications are noted. No bony abnormality is seen. IMPRESSION: Significant reduction in the degree of fecal material within the colon. No acute abnormality is noted. Electronically Signed   By: Inez Catalina M.D.   On: 02/20/2020 22:26    Procedures Procedures (including critical care time)  Medications Ordered in ED Medications  sodium chloride 0.9 % bolus 1,000 mL (1,000 mLs Intravenous New Bag/Given 02/20/20 2256)  fentaNYL (SUBLIMAZE) injection 50 mcg (50 mcg Intravenous Given 02/20/20 2257)  ondansetron (ZOFRAN) injection 4 mg (4 mg Intravenous Given 02/20/20 2257)  potassium chloride SA (KLOR-CON) CR tablet 40 mEq (40 mEq Oral Given 02/20/20 2336)  iohexol (OMNIPAQUE) 300 MG/ML solution 100 mL (100 mLs Intravenous Contrast Given 02/20/20 2357)    ED Course  I have reviewed the triage vital signs and the nursing notes.  Pertinent labs & imaging results that were available during my care of the patient were reviewed by me and considered in my medical decision making (see chart for details).    MDM Rules/Calculators/A&P                          Patient has some mild hypokalemia which is likely from the volume of stools.  She defers rectal exam but  given the negative x-ray, it is unlikely she has a fecal impaction.  Labs are reassuring.  I discussed options with patient and she wants to do CT abdomen/pelvis with contrast.  She had a fairly negative CT abdomen pelvis without contrast about 9 days ago.  However given the continued pain, I think we need to further rule out intra-abdominal pathology that could be contributing.  It is odd that she would be in this much pain from constipation/diarrhea issues. Care transferred to Dr. Christy Gentles with CT pending.  Final Clinical Impression(s) / ED Diagnoses Final diagnoses:  None    Rx / DC Orders ED Discharge Orders    None       Sherwood Gambler, MD 02/21/20 0000

## 2020-02-21 ENCOUNTER — Ambulatory Visit (INDEPENDENT_AMBULATORY_CARE_PROVIDER_SITE_OTHER): Payer: Medicare Other | Admitting: Internal Medicine

## 2020-02-21 ENCOUNTER — Telehealth: Payer: Self-pay

## 2020-02-21 ENCOUNTER — Encounter: Payer: Self-pay | Admitting: Internal Medicine

## 2020-02-21 VITALS — BP 154/68 | HR 60 | Ht 63.0 in | Wt 138.2 lb

## 2020-02-21 DIAGNOSIS — R112 Nausea with vomiting, unspecified: Secondary | ICD-10-CM | POA: Diagnosis not present

## 2020-02-21 DIAGNOSIS — K5900 Constipation, unspecified: Secondary | ICD-10-CM | POA: Diagnosis not present

## 2020-02-21 DIAGNOSIS — I700 Atherosclerosis of aorta: Secondary | ICD-10-CM

## 2020-02-21 DIAGNOSIS — R933 Abnormal findings on diagnostic imaging of other parts of digestive tract: Secondary | ICD-10-CM

## 2020-02-21 DIAGNOSIS — R1084 Generalized abdominal pain: Secondary | ICD-10-CM

## 2020-02-21 DIAGNOSIS — Z7902 Long term (current) use of antithrombotics/antiplatelets: Secondary | ICD-10-CM | POA: Diagnosis not present

## 2020-02-21 DIAGNOSIS — R1110 Vomiting, unspecified: Secondary | ICD-10-CM | POA: Diagnosis not present

## 2020-02-21 MED ORDER — FENTANYL CITRATE (PF) 100 MCG/2ML IJ SOLN
50.0000 ug | Freq: Once | INTRAMUSCULAR | Status: AC
  Administered 2020-02-21: 50 ug via INTRAVENOUS
  Filled 2020-02-21: qty 2

## 2020-02-21 MED ORDER — CLENPIQ 10-3.5-12 MG-GM -GM/160ML PO SOLN: 1.0000 | ORAL | 0 refills | Status: DC

## 2020-02-21 MED ORDER — HYDROCODONE-ACETAMINOPHEN 5-325 MG PO TABS: 1.0000 | ORAL_TABLET | Freq: Four times a day (QID) | ORAL | 0 refills | Status: DC | PRN

## 2020-02-21 NOTE — Progress Notes (Signed)
NERIDA BOIVIN 64 y.o. 11-Oct-1955 [id number]  Assessment & Plan:   Encounter Diagnoses  Name Primary?  . Abnormal CT scan, colon Yes  . Constipation, unspecified constipation type   . Encounter for long-term use of antiplatelets/antithrombotics   . Abdominal aortic atherosclerosis ([place])   . Generalized abdominal pain    This is quite confusing.  My working diagnosis had been functional GI disturbance irritable bowel pelvic floor dysfunction in the setting of poorly controlled diabetes.  Her feelings of sensing that she is full of stool etc. have not correlated with every imaging study we have done.  Given the recent CT scan, I really doubt in the context of the one 11 days prior and the colonoscopy in February that she has lesions that could be colon cancer but wonder could she have some sort of ischemic problems going on.  That itself is not very likely either.  I suspect more likely this is artifact related to a fluid-filled colon after taking GoLYTELY over the weekend and perhaps there was stool as opposed to mucosal abnormalities though that is just an educated guess at this point.  We have decided to repeat a colonoscopy for clarification.  Double prep given complaints of constipation.  We will hold Plavix 5 days prior.  Further plans pending this she should keep her pelvic floor physical therapy appointment as well unless we turn up something that would explain her problems on this colonoscopy.  She has tried Amitiza before and unfortunately I do not think Linzess is affordable for her or covered on her insurance nor is Science writer.  Note I had asked her to repeat the use of Reglan which we have used in the past because of a diagnosis of suspected gastroparesis (this was stopped previously due to diarrhea).  She is to continue to try to use that especially during her colonoscopy prep.  Improved control of diabetes will be helpful as well that has been elusive  The risks and  benefits as well as alternatives of endoscopic procedure(s) have been discussed and reviewed. All questions answered. The patient agrees to proceed. Rare but real risk of vascular event off Plavix reviewed.  She takes that due to history of stroke and for peripheral vascular disease I believe.  I appreciate the opportunity to care for this patient. CC: [name], MD  Subjective:   Chief Complaint: Abdominal pain and constipation  HPI The patient is a 64 year old African-American woman who has been troubled by severe complaints of constipation over the last several weeks to months with a history of IBS that has also included diarrhea.  She has tried multiple colonoscopy purges but cannot get rid of a sensation of feeling full and bloated and never has anything more than a "small poop".  Most recently I had had her do a CT scan on October 7 which was unrevealing.  She was prescribed GoLYTELY by primary care and started that over the weekend and drank that over about 48 hours and was watching football on Sunday and developed abdominal pain and felt bad and went to the ER where she had a fluid-filled colon into if not 3 areas that could represent mass lesions the radiologist thought.  This was significantly different from the CT scan just 10 days or so prior.  They also noted that she had a lax mesentery and that her cecum was in the high right abdomen.  She has significant abdominal atherosclerosis and has had peripheral vascular disease and bypass  procedures for the extremities in the past.  Wt Readings from Last 3 Encounters:  02/21/20 138 lb 4 oz (62.7 kg)  02/20/20 133 lb (60.3 kg)  02/09/20 132 lb (59.9 kg)    She had a colonoscopy in February when she was in [facility], she had GI bleeding thought to be diverticular in the setting of Plavix use, she had a couple of small polyps.  Report and images have been reviewed.  She has a pelvic floor physical therapy consultation pending for  November.  In the ER yesterday glucose was 176 other chemistries other than potassium of 3.2 were normal hemoglobin was 12 white count 5.6 and urinalysis was negative for infection though she was spilling glucose   Allergies  Allergen Reactions  . Sulfa Antibiotics Shortness Of Breath and Palpitations  . 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 Fish Allergy Hives and Swelling    Tongue swelling  . Iodine Swelling  . Metformin And Related Diarrhea  . [name] [Insulin Glargine-Lixisenatide] Itching and Other (See Comments)    "tongue swelling"  . Adhesive [Tape] Rash    Paper tape is ok  . Shellfish Allergy Swelling and Rash    Tongue swelling   Current Meds  Medication Sig  . acetaminophen (TYLENOL) 325 MG tablet Take 2 tablets (650 mg total) by mouth every 6 (six) hours as needed for mild pain (or Fever >/= 101).  Marland Kitchen albuterol (PROAIR HFA) 108 (90 Base) MCG/ACT inhaler 1-2 inhalations every 4-6 hours as needed for cough or wheeze. (Patient taking differently: Inhale 1-2 puffs into the lungs every 4 (four) hours as needed for shortness of breath. )  . ALPRAZolam (XANAX) 0.5 MG tablet Take 0.5 mg by mouth 2 (two) times daily as needed for anxiety.  . Azelastine HCl 0.15 % SOLN Place 2 sprays into both nostrils 2 (two) times daily. (Patient taking differently: Place 2 sprays into both nostrils 2 (two) times daily as needed (allergies). )  . bisacodyl (DULCOLAX) 5 MG EC tablet Take 5 mg by mouth daily as needed for moderate constipation.  . citalopram (CELEXA) 10 MG tablet Take 10 mg by mouth daily as needed.   . clopidogrel (PLAVIX) 75 MG tablet Take 1 tablet (75 mg total) by mouth daily.  . Fluocinolone Acetonide Body 0.01 % OIL Apply topically 2 (two) times daily.  . insulin lispro (HUMALOG) 100 UNIT/ML injection Inject 5-12 Units into the skin 2 (two) times daily before a meal. Per sliding scale using OMNIPOD  . loperamide (IMODIUM) 2 MG  capsule Take 1 capsule (2 mg total) by mouth as needed for diarrhea or loose stools.  Marland Kitchen losartan-hydrochlorothiazide (HYZAAR) 50-12.5 MG tablet Take 1 tablet by mouth daily.  . Magnesium 200 MG TABS Take 1 tablet (200 mg total) by mouth daily.  . metoCLOPramide (REGLAN) 5 MG tablet Take 1 tablet (5 mg total) by mouth 3 (three) times daily before meals.  . mupirocin ointment (BACTROBAN) 2 % Apply 1 application topically 2 (two) times daily. (Patient taking differently: Apply 1 application topically 2 (two) times daily as needed. )  . ondansetron (ZOFRAN) 4 MG tablet Take 1 tablet (4 mg total) by mouth daily as needed for nausea or vomiting.  . pantoprazole (PROTONIX) 40 MG tablet TAKE 1 TABLET BY MOUTH EVERY DAY (Patient taking differently: Take 40 mg by mouth daily. )  . polyethylene glycol (MIRALAX / GLYCOLAX) 17 g packet Take 17 g by mouth  daily as needed for moderate constipation or severe constipation.  . potassium chloride (KLOR-CON) 10 MEQ tablet Take 10 mEq by mouth daily.  . propranolol ER (INDERAL LA) 60 MG 24 hr capsule Take 1 capsule (60 mg total) by mouth every evening.  . rosuvastatin (CRESTOR) 40 MG tablet Take 1 tablet (40 mg total) by mouth every morning.  . SUMAtriptan (IMITREX) 100 MG tablet Take 100 mg by mouth every 2 (two) hours as needed for migraine.   . temazepam (RESTORIL) 30 MG capsule Take 30 mg by mouth at bedtime as needed for sleep.  . traZODone (DESYREL) 100 MG tablet Take 1 tablet (100 mg total) by mouth at bedtime.  . TRESIBA 100 UNIT/ML SOLN SMARTSIG:35 Unit(s) SUB-Q Daily   Past Medical History:  Diagnosis Date  . 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 Anemia    hx  . Angioedema 07/10/2015  . Anxiety   . Arthritis    "neck, left hand" (09/14/2012)  . Asthma   . Cancer ([place]) 1985   ovarian, no treatment except surgery  . Complication of anesthesia    OCCASIONAL TROUBLE TURNING NECK TO RIGHT  . Critical lower limb ischemia ([place])    10/2014 s/p L SFA stenting  .  Diverticulosis of colon with hemorrhage April 2013  . GERD (gastroesophageal reflux disease)   . GI bleed   . H/O hiatal hernia   . Heart attack [facility])    2003 mild MI, March 2013 mild MI  . Hidradenitis    groin  . History of blood transfusion 1985 AND 2013  . Hyperlipidemia   . Hypertension   . Irritable bowel syndrome   . Left-sided weakness    since stroke, left eye trouble seeing  . Migraines   . Mild CAD    a. Cath 09/2010: mild luminal irregularities of LAD, 30% prox RCA and 20-30% mRCA, EF 65%.  . Neuropathy   . Obesity   . PAD (peripheral artery disease) ([place])    a. critical limb ischemia s/p PTA/stenting of L SFA 10/2014. c. occ prior SFA stent by angio 01/2016, for possible PV bypass.  . Pneumonia    baby  . Recurrent upper respiratory infection (URI)   . S/P arterial stent-mid Lt SFA 11/03/14 11/04/2014  . Schatzki's ring   . Sinus problem   . Stomach ulcer 1972   non-bleeding  . Stroke ([place]) 07-2007, 07-2008, 07-2009   total 3 strokes; mild left sided weakness and left eye "jumps".  . Type II diabetes mellitus ([place])   . Vitamin B 12 deficiency 07-15-2013   Past Surgical History:  Procedure Laterality Date  . ABDOMINAL HYSTERECTOMY  1985  . ADENOIDECTOMY    . ANTERIOR CERVICAL DECOMP/DISCECTOMY FUSION  2002  . ANTERIOR CERVICAL DECOMP/DISCECTOMY FUSION N/A 04/08/2014   Procedure: Cervical Six-Seven ANTERIOR CERVICAL DECOMPRESSION/DISCECTOMY FUSION Plating and Bonegraft  2 LEVELS;  Surgeon: Ashok Pall, MD;  Location: [place] NEURO ORS;  Service: Neurosurgery;  Laterality: N/A;  Cervical Six-Seven ANTERIOR CERVICAL DECOMPRESSION/DISCECTOMY FUSION Plating and Bonegraft  2 LEVELS  . APPENDECTOMY  1985  . AXILLARY HIDRADENITIS EXCISION  1990-2008   bilateral  . BACK SURGERY    . BREAST BIOPSY Right 2007  . BREAST CYST EXCISION Right 2008  . BREAST REDUCTION SURGERY    . CARDIAC CATHETERIZATION  2004   mild disease  . CATARACT EXTRACTION W/PHACO Left 05/16/2015   Procedure:  CATARACT EXTRACTION PHACO AND INTRAOCULAR LENS PLACEMENT (IOC);  Surgeon: Rutherford Guys, MD;  Location: [facility];  Service: Ophthalmology;  Laterality: Left;  CDE: 4.24  . CATARACT EXTRACTION W/PHACO Right 05/30/2015   Procedure: CATARACT EXTRACTION RIGHT EYE PHACO AND INTRAOCULAR LENS PLACEMENT ;  Surgeon: Rutherford Guys, MD;  Location: [facility];  Service: Ophthalmology;  Laterality: Right;  CDE:4.08  . CHOLECYSTECTOMY  1990's  . COLONOSCOPY  08/12/2011   Procedure: COLONOSCOPY;  Surgeon: Ladene Artist, MD,FACG;  Location: [facility] ENDOSCOPY;  Service: Endoscopy;  Laterality: N/A;  . COLONOSCOPY  06/19/2006  . COLONOSCOPY WITH PROPOFOL N/A 07/02/2019   Procedure: COLONOSCOPY WITH PROPOFOL;  Surgeon: Rogene Houston, MD;  [facility];  Service: Endoscopy;  Laterality: N/A;  . cyst thigh Right   . ESOPHAGOGASTRODUODENOSCOPY  08/12/2011   Procedure: ESOPHAGOGASTRODUODENOSCOPY (EGD);  Surgeon: Ladene Artist, MD,FACG;  Location[facility] ENDOSCOPY;  Service: Endoscopy;  Laterality: N/A;  . ESOPHAGOGASTRODUODENOSCOPY  06/04/2005  . ESOPHAGOGASTRODUODENOSCOPY (EGD) WITH PROPOFOL N/A 07/01/2019   Procedure: ESOPHAGOGASTRODUODENOSCOPY (EGD) WITH PROPOFOL;  Surgeon: Rogene Houston, MD;  [facility];  Service: Endoscopy;  Laterality: N/A;  . FEMORAL-POPLITEAL BYPASS GRAFT Left 06/19/2016   Procedure: Left Leg BYPASS GRAFT FEMORAL-POPLITEAL ARTERY;  Surgeon: Serafina Mitchell, MD;  Location: [place];  Service: Vascular;  Laterality: Left;  . GIVENS CAPSULE STUDY  08/13/2011   Procedure: GIVENS CAPSULE STUDY;  Surgeon: Ladene Artist, MD,FACG;  Location: [facility] ENDOSCOPY;  Service: Endoscopy;  Laterality: N/A;  . HAMMER TOE SURGERY Bilateral ~ 2000  . HEMOSTASIS CLIP PLACEMENT  07/02/2019   Procedure: HEMOSTASIS CLIP PLACEMENT;  Surgeon: Rogene Houston, MD;  [facility];  Service: Endoscopy;;  hepatic flexure  . HIATAL HERNIA REPAIR    . HYDRADENITIS EXCISION  01/2011; 03/2012   'groin and abdomen;  03/2012" (09/14/2012)  . HYDRADENITIS EXCISION  04/01/2012   Procedure: EXCISION HYDRADENITIS GROIN;  Surgeon: Pedro Earls, MD;  Location: [facility];  Service: General;  Laterality: Bilateral;  Excision of Hydradenitis of Perineum  . HYDRADENITIS EXCISION N/A 09/17/2013   Procedure: EXCISION PERINEAL HIDRADENITIS ;  Surgeon: Pedro Earls, MD;  Location: [facility];  Service: General;  Laterality: N/A;  also in the pubis area  . LEFT HEART CATH AND CORONARY ANGIOGRAPHY N/A 12/26/2016   Procedure: LEFT HEART CATH AND CORONARY ANGIOGRAPHY;  Surgeon: [name], Peter M, MD;  Location: [place] CV LAB;  Service: Cardiovascular;  Laterality: N/A;  . MASS EXCISION Right 09/17/2013   Procedure: EXCISION MASS;  Surgeon: Pedro Earls, MD;  Location: [facility]S;  Service: General;  Laterality: Right;  . NISSEN FUNDOPLICATION  6440  . PERIPHERAL VASCULAR CATHETERIZATION N/A 11/03/2014   Procedure: Lower Extremity Angiography;  Surgeon: Lorretta Harp, MD;  Location: [place] CV LAB;  Service: Cardiovascular;  Laterality: N/A;  . PERIPHERAL VASCULAR CATHETERIZATION N/A 01/29/2016   Procedure: Lower Extremity Angiography;  Surgeon: Lorretta Harp, MD;  Location: [place] CV LAB;  Service: Cardiovascular;  Laterality: N/A;  . POLYPECTOMY  07/02/2019   Procedure: POLYPECTOMY;  Surgeon: Rogene Houston, MD;  [facility];  Service: Endoscopy;;  . POSTERIOR LUMBAR FUSION  2008 X 2  . REDUCTION MAMMAPLASTY  1996?  . TONSILLECTOMY AND ADENOIDECTOMY  1959 AND 2000  . 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  Divorced, living alone in [place].     family history includes  Allergic rhinitis in her sister; Aneurysm in her sister; Breast cancer in her mother; Diabetes in her father and sister; Emphysema  in an other family member; Heart disease in her father, mother, and sister; Hyperlipidemia in her sister; Hypertension in her mother, sister, and sister; Stroke in her father.   Review of Systems As above  Objective:   Physical Exam BP (!) 154/68 (BP Location: Left Arm, Patient Position: Sitting, Cuff Size: Normal)   Pulse 60   Ht 5\' 3"  (1.6 m)   Wt 138 lb 4 oz (62.7 kg)   BMI 24.49 kg/m  No acute distress Abdomen slightly protuberant soft mildly tender in the left upper quadrant to deep palpation but no organomegaly or mass bowel sounds are present

## 2020-02-21 NOTE — Telephone Encounter (Signed)
[facility] Pre-operative Risk Assessment     Request for surgical clearance:     Endoscopy Procedure  What type of surgery is being performed?     colonoscopy  When is this surgery scheduled?     03/02/2020  What type of clearance is required ?   Pharmacy  Are there any medications that need to be held prior to surgery and how long? Plavix, 5 days  Practice name and name of physician performing surgery?      [facility]  What is your office phone and fax number?      Phone- [phone]  [phone]  Anesthesia type (None, local, MAC, general) ?       MAC

## 2020-02-21 NOTE — Discharge Instructions (Addendum)
PLEASE FOLLOWUP WITH DR Carlean Purl AS YOU MAY NEED ANOTHER COLONOSCOPY

## 2020-02-21 NOTE — ED Provider Notes (Signed)
Ct RESULTS D/W PATIENT WILL NEED CLOSE F/U WITH GI AND MAY NEED COLONOSCOPY TO R/O CANCER.  PT JUST HAD C-SCOPE WITHIN PAST YEAR PT OVERALL FEELS IMPROVED AND REQUESTING D/C HOME   Ripley Fraise, MD 02/21/20 0131

## 2020-02-21 NOTE — Patient Instructions (Addendum)
You have been scheduled for a colonoscopy. Please follow written instructions given to you at your visit today.  Please pick up your prep supplies at the pharmacy within the next 1-3 days. If you use inhalers (even only as needed), please bring them with you on the day of your procedure.   You will be contacted by our office prior to your procedure for directions on holding your Plavix.  If you do not hear from our office 1 week prior to your scheduled procedure, please call [phone] to discuss.   We have sent the following medications to your pharmacy for you to pick up at your convenience: Vicodin   I appreciate the opportunity to care for you. Silvano Rusk,  MD, [facility]

## 2020-02-21 NOTE — Telephone Encounter (Signed)
Dr. Gwenlyn Found, Pt has a history of PAD s/p SFA stenting 2016 and then fem-pop bypass in 2018. OK to hold plavix for endoscopy?

## 2020-02-22 NOTE — Telephone Encounter (Signed)
Okay to hold Plavix for colonoscopy 

## 2020-02-22 NOTE — Telephone Encounter (Signed)
   Primary Cardiologist: Quay Burow, MD  Chart reviewed as part of pre-operative protocol coverage. We have been asked for guidance to hold plavix, not medical clearance. Pt is s/p SFA stenting followed by fem-pop bypass in 2018. Per Dr. Gwenlyn Found, Colon to hold plavix for colonoscopy.     I will route this recommendation to the requesting party via Epic fax function and remove from pre-op pool. Please call with questions.  Tami Lin Jacory Kamel, PA 02/22/2020, 1:51 PM

## 2020-02-23 ENCOUNTER — Encounter: Payer: Self-pay | Admitting: Gastroenterology

## 2020-02-23 ENCOUNTER — Telehealth: Payer: Self-pay

## 2020-02-23 NOTE — Telephone Encounter (Signed)
[name] informed to hold her plavix for 5 days prior to her colonoscopy on 03/02/20 with Dr Lyndel Safe. She verbalized understanding.

## 2020-02-24 NOTE — Telephone Encounter (Signed)
I s/w pt and advised she is ok to hold her Plavix x 5 days for her procedure per Dr. Gwenlyn Found and our Pre Op Team. Pt thanked me for the call. I will forward clearance notes to requesting office. Will remove form the pre op call back pool.

## 2020-02-28 ENCOUNTER — Other Ambulatory Visit: Payer: Self-pay | Admitting: *Deleted

## 2020-02-28 ENCOUNTER — Other Ambulatory Visit: Payer: Self-pay

## 2020-02-28 NOTE — Patient Outreach (Addendum)
[facility]) Care Management  02/28/2020  [name] [birth year] [id number]   [facility] outreach complex care   [name] was referred to [facility] on 08/02/19 byTHNhospital liaison, B Smith Referral Reason:Post hospital follow up Diagnoses of Diabetes,CKD Please refer to community telephonic RN for care management services. Insurance:united healthcare medicare  Last admission 07/28/19 to 07/30/19  Outreach to patient to follow up on ED visit and abdominal symptoms She was awaken  [facility] RN CM agreed to outreach to her at a later time  Notes indicate upcoming (03/02/20) repeat colonoscopy after noted colonic areas noted during recent ED tests Pt seen by gastroenterologist after ED visit     [facility] RN CM scheduled this [facility] engaged patient for another call attempt within7-14business daysor prn  [name] L. Lavina Hamman, RN, BSN, [place] Coordinator Office number [phone] Main [facility] number [phone] Fax number [phone]

## 2020-02-28 NOTE — Patient Outreach (Signed)
[facility] [facility]) Care Management  02/28/2020  [name] [birth year] [id number]   [facility] outreach complex care   [name] was referred to [facility] on 08/02/19 byTHNhospital liaison, B Smith Referral Reason:Post hospital follow up Diagnoses of Diabetes,CKD Please refer to community telephonic RN for care management services. Insurance:united healthcare medicare  Last admission 07/28/19 to 07/30/19  Patient returned a call to [place] Patient is able to verify HIPAA ([place] and Accountability Act) identifiers Consent: [name] RN CM reviewed [facility] services with patient. Patient gave verbal consent for services.  Follow up  [name] confirms she continues to have abdominal issues with minimal stool She confirms her ED visit and information shared by ED MD, ED follow up gastroenterology office visit and upcoming colonoscopy on 03/02/20  She had all the medications needed for pre op and has stopped her Plavix She reports getting rest prn  She discussed some social concerns with her car and family Call concluded to outreach to family    [facility] RN CM scheduled this [facility] engaged patient for another call attempt within7-14business daysor prn  Jennifer Millin L. Lavina Hamman, RN, BSN, [place] Coordinator Office number [phone] Main [facility] number [phone] Fax number [phone]

## 2020-03-02 ENCOUNTER — Ambulatory Visit (AMBULATORY_SURGERY_CENTER): Payer: Medicare Other | Admitting: Gastroenterology

## 2020-03-02 ENCOUNTER — Encounter: Payer: Self-pay | Admitting: Gastroenterology

## 2020-03-02 ENCOUNTER — Other Ambulatory Visit: Payer: Self-pay

## 2020-03-02 VITALS — BP 153/66 | HR 64 | Temp 97.6°F | Resp 18 | Ht 63.0 in | Wt 138.0 lb

## 2020-03-02 DIAGNOSIS — K5730 Diverticulosis of large intestine without perforation or abscess without bleeding: Secondary | ICD-10-CM

## 2020-03-02 DIAGNOSIS — Z1211 Encounter for screening for malignant neoplasm of colon: Secondary | ICD-10-CM | POA: Diagnosis not present

## 2020-03-02 DIAGNOSIS — D123 Benign neoplasm of transverse colon: Secondary | ICD-10-CM | POA: Diagnosis not present

## 2020-03-02 DIAGNOSIS — R933 Abnormal findings on diagnostic imaging of other parts of digestive tract: Secondary | ICD-10-CM | POA: Diagnosis not present

## 2020-03-02 MED ORDER — SODIUM CHLORIDE 0.9 % IV SOLN: 500.0000 mL | Freq: Once | INTRAVENOUS | Status: DC

## 2020-03-02 NOTE — Patient Instructions (Addendum)
HANDOUTS PROVIDED ON: POLYPS & DIVERTICULOSIS  The polyps removed today have been sent for pathology.  The results can take 1-3 weeks to receive.  When your next colonoscopy should occur will be based on the pathology results.    You may resume your previous diet.  You may take any normal medications today EXCEPT Plavix which you can resume tomorrow 03/03/20.  Thank you for allowing [place] to care for you today!!!   YOU HAD AN ENDOSCOPIC PROCEDURE TODAY AT [place]:   Refer to the procedure report that was given to you for any specific questions about what was found during the examination.  If the procedure report does not answer your questions, please call your gastroenterologist to clarify.  If you requested that your care partner not be given the details of your procedure findings, then the procedure report has been included in a sealed envelope for you to review at your convenience later.  YOU SHOULD EXPECT: Some feelings of bloating in the abdomen. Passage of more gas than usual.  Walking can help get rid of the air that was put into your GI tract during the procedure and reduce the bloating. If you had a lower endoscopy (such as a colonoscopy or flexible sigmoidoscopy) you may notice spotting of blood in your stool or on the toilet paper. If you underwent a bowel prep for your procedure, you may not have a normal bowel movement for a few days.  Please Note:  You might notice some irritation and congestion in your nose or some drainage.  This is from the oxygen used during your procedure.  There is no need for concern and it should clear up in a day or so.  SYMPTOMS TO REPORT IMMEDIATELY:   Following lower endoscopy (colonoscopy or flexible sigmoidoscopy):  Excessive amounts of blood in the stool  Significant tenderness or worsening of abdominal pains  Swelling of the abdomen that is new, acute  Fever of 100F or higher  For urgent or emergent issues, a gastroenterologist  can be reached at any hour by calling [phone]. Do not use MyChart messaging for urgent concerns.    DIET:  We do recommend a small meal at first, but then you may proceed to your regular diet.  Drink plenty of fluids but you should avoid alcoholic beverages for 24 hours.  ACTIVITY:  You should plan to take it easy for the rest of today and you should NOT DRIVE or use heavy machinery until tomorrow (because of the sedation medicines used during the test).    FOLLOW UP: Our staff will call the number listed on your records Monday morning between 7:15 am and 8:15 am to check on you and address any questions or concerns that you may have regarding the information given to you following your procedure. If we do not reach you, we will leave a message.  We will attempt to reach you two times.  During this call, we will ask if you have developed any symptoms of COVID 19. If you develop any symptoms (ie: fever, flu-like symptoms, shortness of breath, cough etc.) before then, please call [phone].  If you test positive for Covid 19 in the 2 weeks post procedure, please call and report this information to [place].    If any biopsies were taken you will be contacted by phone or by letter within the next 1-3 weeks.  Please call us at [phone] if you have not heard about the biopsies in 3  weeks.    SIGNATURES/CONFIDENTIALITY: You and/or your care partner have signed paperwork which will be entered into your electronic medical record.  These signatures attest to the fact that that the information above on your After Visit Summary has been reviewed and is understood.  Full responsibility of the confidentiality of this discharge information lies with you and/or your care-partner.

## 2020-03-02 NOTE — Progress Notes (Signed)
A and O x3. Report to RN. Tolerated MAC anesthesia well.

## 2020-03-02 NOTE — Progress Notes (Signed)
Called to room to assist during endoscopic procedure.  Patient ID and intended procedure confirmed with present staff. Received instructions for my participation in the procedure from the performing physician.  

## 2020-03-02 NOTE — Op Note (Signed)
[facility] Patient Name: [name] Procedure Date: 03/02/2020 10:59 AM MRN: [id number] Endoscopist: Jackquline Denmark , MD Age: 64 Referring MD:  Date of Birth: May 04, 1956 Gender: Female Account #: [id number] Procedure:                Colonoscopy Indications:              Abnormal CT of the GI tract Medicines:                Monitored Anesthesia Care Procedure:                Pre-Anesthesia Assessment:                           - Prior to the procedure, a History and Physical                            was performed, and patient medications and                            allergies were reviewed. The patient's tolerance of                            previous anesthesia was also reviewed. The risks                            and benefits of the procedure and the sedation                            options and risks were discussed with the patient.                            All questions were answered, and informed consent                            was obtained. Prior Anticoagulants: The patient on                            plavix, held 5 days before. ASA Grade Assessment:                            II - A patient with mild systemic disease. After                            reviewing the risks and benefits, the patient was                            deemed in satisfactory condition to undergo the                            procedure.                           After obtaining informed consent, the colonoscope  was passed under direct vision. Throughout the                            procedure, the patient's blood pressure, pulse, and                            oxygen saturations were monitored continuously. The                            Colonoscope was introduced through the anus and                            advanced to the 2 cm into the ileum. The                            colonoscopy was performed without difficulty. The                             patient tolerated the procedure well. The quality                            of the bowel preparation was good. The terminal                            ileum, ileocecal valve, appendiceal orifice, and                            rectum were photographed. Scope In: 11:04:13 AM Scope Out: 11:22:10 AM Scope Withdrawal Time: 0 hours 12 minutes 10 seconds  Total Procedure Duration: 0 hours 17 minutes 57 seconds  Findings:                 Three sessile polyps were found in the proximal                            transverse colon and hepatic flexure (2, one with                            small scar). The polyps were 2 to 4 mm in size.                            These polyps were removed with a cold snare.                            Resection and retrieval were complete.                           A few medium-mouthed diverticula were found in the                            sigmoid colon, descending colon and ascending colon.                           Non-bleeding internal hemorrhoids  were found during                            retroflexion. The hemorrhoids were small.                           The terminal ileum appeared normal.                           The exam was otherwise without abnormality on                            direct and retroflexion views. Complications:            No immediate complications. Estimated Blood Loss:     Estimated blood loss: none. Impression:               - Three 2 to 4 mm polyps in the proximal transverse                            colon and at the hepatic flexure, removed with a                            cold snare. Resected and retrieved.                           - Mild pancolonic diverticulosis.                           - Non-bleeding internal hemorrhoids.                           - The examined portion of the ileum was normal.                           - The examination was otherwise normal on direct                            and retroflexion  views. No masses. Recommendation:           - Patient has a contact number available for                            emergencies. The signs and symptoms of potential                            delayed complications were discussed with the                            patient. Return to normal activities tomorrow.                            Written discharge instructions were provided to the                            patient.                           -  Resume previous diet.                           - Continue present medications.                           - Await pathology results.                           - Repeat colonoscopy for surveillance based on                            pathology results.                           - Resume Plavix (clopidogrel) at prior dose                            tomorrow.                           - FU with [facility] clinic (for Dr Carlean Purl( in 6-8 weeks).                           - The findings and recommendations were discussed                            with [name]. Jackquline Denmark, MD 03/02/2020 11:31:29 AM This report has been signed electronically.

## 2020-03-06 ENCOUNTER — Telehealth: Payer: Self-pay

## 2020-03-06 NOTE — Telephone Encounter (Signed)
  Follow up Call-  Call back number 03/02/2020 08/19/2017  Post procedure Call Back phone  # [phone]  Permission to leave phone message Yes Yes  Some recent data might be hidden     Patient questions:  Do you have a fever, pain , or abdominal swelling? No. Pain Score  0 *  Have you tolerated food without any problems? Yes.    Have you been able to return to your normal activities? Yes.    Do you have any questions about your discharge instructions: Diet   No. Medications  No. Follow up visit  No.  Do you have questions or concerns about your Care? No.  Actions: * If pain score is 4 or above: No action needed, pain <4.  1. Have you developed a fever since your procedure? no  2.   Have you had an respiratory symptoms (SOB or cough) since your procedure? no  3.   Have you tested positive for COVID 19 since your procedure no  4.   Have you had any family members/close contacts diagnosed with the COVID 19 since your procedure?  no   If yes to any of these questions please route to Joylene John, RN and Joella Prince, RN

## 2020-03-07 ENCOUNTER — Other Ambulatory Visit: Payer: Self-pay | Admitting: *Deleted

## 2020-03-07 NOTE — Patient Outreach (Signed)
[facility] [facility]) Care Management  03/07/2020  [name] 12-16-1955 [id number]   [facility] Unsuccessful outreach  [name] was referred to [facility] on 08/02/19 byTHNhospital liaison, B Smith Referral Reason:Post hospital follow up Diagnoses of Diabetes,CKD Please refer to community telephonic RN for care management services. Insurance:united healthcare medicare  Last admission 07/28/19 to 07/30/19   Outreach attempt to the home number  No answer. [name] RN CM left HIPAA [facility] Portability and Accountability Act) compliant voicemail message along with CM's contact info.   Plan: [facility] RN CM scheduled this patient for another call attempt within 7-10 business days  Brant Peets L. Lavina Hamman, RN, BSN, [place] Coordinator Office number [phone] Mobile number [phone]  Main [name] number [phone] Fax number [phone]

## 2020-03-09 ENCOUNTER — Encounter: Payer: Self-pay | Admitting: Internal Medicine

## 2020-03-09 ENCOUNTER — Ambulatory Visit (INDEPENDENT_AMBULATORY_CARE_PROVIDER_SITE_OTHER): Payer: Medicare Other | Admitting: Internal Medicine

## 2020-03-09 VITALS — BP 130/64 | HR 53 | Ht 63.0 in | Wt 136.0 lb

## 2020-03-09 DIAGNOSIS — Z86010 Personal history of colonic polyps: Secondary | ICD-10-CM

## 2020-03-09 DIAGNOSIS — R140 Abdominal distension (gaseous): Secondary | ICD-10-CM

## 2020-03-09 DIAGNOSIS — K582 Mixed irritable bowel syndrome: Secondary | ICD-10-CM | POA: Diagnosis not present

## 2020-03-09 NOTE — Patient Instructions (Signed)
    Please follow up with Dr Carlean Purl as needed.   I appreciate the opportunity to care for you. Silvano Rusk, MD, [facility]

## 2020-03-09 NOTE — Progress Notes (Signed)
[name] 64 y.o. 05-26-1955 [id number]  Assessment & Plan:   Encounter Diagnoses  Name Primary?  . Irritable bowel syndrome with both constipation and diarrhea Yes  . Gassiness   . Hx of adenomatous colonic polyps     She is so much better.  I am not sure why to be honest but she is and we will go with that.  She needs follow-up of her diabetes that is uncontrolled and will establish with endocrine next month.  Continue as needed MiraLAX and bisacodyl.  Follow-up with me as needed.  Recall colonoscopy 5 years  CC: Jennifer Squibb, MD  Subjective:   Chief Complaint: Follow-up after colonoscopy  HPI 64 year old African-American woman with IBS mixed pattern though more predominantly constipation lately, uncontrolled diabetes mellitus here for follow-up.  I last saw her on October 18  Colonoscopy was recommended then and performed by Dr. Lyndel [name] findings as below.  The polyps were adenomatous and the procedure was done on October 28.  Three 2 to 4 mm polyps in the proximal transverse colon and at the hepatic flexure, removed with a cold snare. Resected and retrieved. - Mild pancolonic diverticulosis. - Non-bleeding internal hemorrhoids. - The examined portion of the ileum was normal. - The examination was otherwise normal on direct and retroflexion views. No masses.  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  However major improvement compared to what she went through the last couple of months.  She is only using bisacodyl or MiraLAX as needed and not in a while.  She is unable to follow through with pelvic floor physical therapy due to "low funds".  She has been through many medical tests in the last couple of months so this is understandable and she is improved fortunately. Allergies  Allergen Reactions  . Sulfa Antibiotics  Shortness Of Breath and Palpitations  . 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 Fish Allergy Hives and Swelling    Tongue swelling  . Iodine Swelling  . Metformin And Related Diarrhea  . [name] [Insulin Glargine-Lixisenatide] Itching and Other (See Comments)    "tongue swelling"  . Adhesive [Tape] Rash    Paper tape is ok  . Shellfish Allergy Swelling and Rash    Tongue swelling   Current Meds  Medication Sig  . acetaminophen (TYLENOL) 325 MG tablet Take 2 tablets (650 mg total) by mouth every 6 (six) hours as needed for mild pain (or Fever >/= 101).  Marland Kitchen albuterol (PROAIR HFA) 108 (90 Base) MCG/ACT inhaler 1-2 inhalations every 4-6 hours as needed for cough or wheeze. (Patient taking differently: Inhale 1-2 puffs into the lungs every 4 (four) hours as needed for shortness of breath. )  . ALPRAZolam (XANAX) 0.5 MG tablet Take 0.5 mg by mouth 2 (two) times daily as needed for anxiety.  . Azelastine HCl 0.15 % SOLN Place 2 sprays into both nostrils 2 (two) times daily. (Patient taking differently: Place 2 sprays into both nostrils 2 (two) times daily as needed (allergies). )  . citalopram (CELEXA) 10 MG tablet Take 10 mg by mouth daily as needed.   . clopidogrel (PLAVIX) 75 MG tablet Take 1 tablet (75 mg total) by mouth daily.  . Continuous Blood Gluc Receiver ([name]) DEVI by Does not apply route.  Marland Kitchen  EPINEPHrine 0.3 mg/0.3 mL IJ SOAJ injection Inject 0.3 mLs (0.3 mg total) into the muscle once.  . Fluocinolone Acetonide Body 0.01 % OIL Apply topically 2 (two) times daily.  Marland Kitchen HYDROcodone-acetaminophen (NORCO/VICODIN) 5-325 MG tablet Take 1 tablet by mouth every 6 (six) hours as needed for severe pain.  Marland Kitchen insulin lispro (HUMALOG) 100 UNIT/ML injection Inject 5-12 Units into the skin 2 (two) times daily before a meal. Per sliding scale using OMNIPOD  . losartan-hydrochlorothiazide (HYZAAR) 50-12.5 MG tablet Take 1 tablet by mouth daily.    . Magnesium 200 MG TABS Take 1 tablet (200 mg total) by mouth daily.  . mupirocin ointment (BACTROBAN) 2 % Apply 1 application topically 2 (two) times daily. (Patient taking differently: Apply 1 application topically 2 (two) times daily as needed. )  . nitroGLYCERIN (NITROSTAT) 0.4 MG SL tablet PLACE 1 TAB UNDER TONGUE EVERY 5 MINS AS NEEDED FOR CHEST PAIN - MAX 3 DOSES THEN 911  . ondansetron (ZOFRAN) 4 MG tablet Take 1 tablet (4 mg total) by mouth daily as needed for nausea or vomiting.  . pantoprazole (PROTONIX) 40 MG tablet TAKE 1 TABLET BY MOUTH EVERY DAY (Patient taking differently: Take 40 mg by mouth daily. )  . potassium chloride (KLOR-CON) 10 MEQ tablet Take 10 mEq by mouth daily.  . propranolol ER (INDERAL LA) 60 MG 24 hr capsule Take 1 capsule (60 mg total) by mouth every evening.  . rosuvastatin (CRESTOR) 40 MG tablet Take 1 tablet (40 mg total) by mouth every morning.  . SUMAtriptan (IMITREX) 100 MG tablet Take 100 mg by mouth every 2 (two) hours as needed for migraine.   . temazepam (RESTORIL) 30 MG capsule Take 30 mg by mouth at bedtime as needed for sleep.  . traZODone (DESYREL) 100 MG tablet Take 1 tablet (100 mg total) by mouth at bedtime.  . TRESIBA 100 UNIT/ML SOLN SMARTSIG:35 Unit(s) SUB-Q Daily   Past Medical History:  Diagnosis Date  . 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 Allergy   . Anemia    hx  . Angioedema 07/10/2015  . Anxiety   . Arthritis    "neck, left hand" (09/14/2012)  . Asthma   . Cancer ([place]) 1985   ovarian, no treatment except surgery  . Cataract   . Complication of anesthesia    OCCASIONAL TROUBLE TURNING NECK TO RIGHT  . Critical lower limb ischemia ([place])    10/2014 s/p L SFA stenting  . Diverticulosis of colon with hemorrhage April 2013  . GERD (gastroesophageal reflux disease)   . GI bleed   . H/O hiatal hernia   . Heart attack [facility])    2003 mild MI, March 2013 mild MI  . Hidradenitis    groin  . History of blood transfusion 1985 AND 2013  .  Hyperlipidemia   . Hypertension   . Irritable bowel syndrome   . Left-sided weakness    since stroke, left eye trouble seeing  . Migraines   . Mild CAD    a. Cath 09/2010: mild luminal irregularities of LAD, 30% prox RCA and 20-30% mRCA, EF 65%.  . Neuropathy   . Obesity   . Osteoporosis   . PAD (peripheral artery disease) ([place])    a. critical limb ischemia s/p PTA/stenting of L SFA 10/2014. c. occ prior SFA stent by angio 01/2016, for possible PV bypass.  . Pneumonia    baby  . Recurrent upper respiratory infection (URI)   . S/P arterial stent-mid Lt SFA 11/03/14 11/04/2014  .  Schatzki's ring   . Sinus problem   . Stomach ulcer 1972   non-bleeding  . Stroke ([place]) 07-2007, 07-2008, 07-2009   total 3 strokes; mild left sided weakness and left eye "jumps".  . Type II diabetes mellitus ([place])   . Vitamin B 12 deficiency 07-15-2013   Past Surgical History:  Procedure Laterality Date  . ABDOMINAL HYSTERECTOMY  1985  . ADENOIDECTOMY    . ANTERIOR CERVICAL DECOMP/DISCECTOMY FUSION  2002  . ANTERIOR CERVICAL DECOMP/DISCECTOMY FUSION N/A 04/08/2014   Procedure: Cervical Six-Seven ANTERIOR CERVICAL DECOMPRESSION/DISCECTOMY FUSION Plating and Bonegraft  2 LEVELS;  Surgeon: Ashok Pall, MD;  Location: [place] NEURO ORS;  Service: Neurosurgery;  Laterality: N/A;  Cervical Six-Seven ANTERIOR CERVICAL DECOMPRESSION/DISCECTOMY FUSION Plating and Bonegraft  2 LEVELS  . APPENDECTOMY  1985  . AXILLARY HIDRADENITIS EXCISION  1990-2008   bilateral  . BACK SURGERY    . BREAST BIOPSY Right 2007  . BREAST CYST EXCISION Right 2008  . BREAST REDUCTION SURGERY    . CARDIAC CATHETERIZATION  2004   mild disease  . CATARACT EXTRACTION W/PHACO Left 05/16/2015   Procedure: CATARACT EXTRACTION PHACO AND INTRAOCULAR LENS PLACEMENT (IOC);  Surgeon: Rutherford Guys, MD;  [facility];  Service: Ophthalmology;  Laterality: Left;  CDE: 4.24  . CATARACT EXTRACTION W/PHACO Right 05/30/2015   Procedure: CATARACT EXTRACTION  RIGHT EYE PHACO AND INTRAOCULAR LENS PLACEMENT ;  Surgeon: Rutherford Guys, MD;  Location: [facility];  Service: Ophthalmology;  Laterality: Right;  CDE:4.08  . CHOLECYSTECTOMY  1990's  . COLONOSCOPY  08/12/2011   Procedure: COLONOSCOPY;  Surgeon: Ladene Artist, MD,FACG;  Location[facility] ENDOSCOPY;  Service: Endoscopy;  Laterality: N/A;  . COLONOSCOPY  06/19/2006  . COLONOSCOPY WITH PROPOFOL N/A 07/02/2019   Procedure: COLONOSCOPY WITH PROPOFOL;  Surgeon: Rogene Houston, MD;  [facility];  Service: Endoscopy;  Laterality: N/A;  . cyst thigh Right   . ESOPHAGOGASTRODUODENOSCOPY  08/12/2011   Procedure: ESOPHAGOGASTRODUODENOSCOPY (EGD);  Surgeon: Ladene Artist, MD,FACG;  Location[facility] ENDOSCOPY;  Service: Endoscopy;  Laterality: N/A;  . ESOPHAGOGASTRODUODENOSCOPY  06/04/2005  . ESOPHAGOGASTRODUODENOSCOPY (EGD) WITH PROPOFOL N/A 07/01/2019   Procedure: ESOPHAGOGASTRODUODENOSCOPY (EGD) WITH PROPOFOL;  Surgeon: Rogene Houston, MD;  [facility];  Service: Endoscopy;  Laterality: N/A;  . FEMORAL-POPLITEAL BYPASS GRAFT Left 06/19/2016   Procedure: Left Leg BYPASS GRAFT FEMORAL-POPLITEAL ARTERY;  Surgeon: Serafina Mitchell, MD;  Location: [facility];  Service: Vascular;  Laterality: Left;  . GIVENS CAPSULE STUDY  08/13/2011   Procedure: GIVENS CAPSULE STUDY;  Surgeon: Ladene Artist, MD,FACG;  Location: [facility] ENDOSCOPY;  Service: Endoscopy;  Laterality: N/A;  . HAMMER TOE SURGERY Bilateral ~ 2000  . HEMOSTASIS CLIP PLACEMENT  07/02/2019   Procedure: HEMOSTASIS CLIP PLACEMENT;  Surgeon: Rogene Houston, MD;  [facility];  Service: Endoscopy;;  hepatic flexure  . HIATAL HERNIA REPAIR    . HYDRADENITIS EXCISION  01/2011; 03/2012   'groin and abdomen; 03/2012" (09/14/2012)  . HYDRADENITIS EXCISION  04/01/2012   Procedure: EXCISION HYDRADENITIS GROIN;  Surgeon: Pedro Earls, MD;  Location: [facility];  Service: General;  Laterality: Bilateral;  Excision of Hydradenitis of Perineum  .  HYDRADENITIS EXCISION N/A 09/17/2013   Procedure: EXCISION PERINEAL HIDRADENITIS ;  Surgeon: Pedro Earls, MD;  Location: [facility];  Service: General;  Laterality: N/A;  also in the pubis area  . LEFT HEART CATH AND CORONARY ANGIOGRAPHY N/A 12/26/2016   Procedure: LEFT HEART CATH AND CORONARY ANGIOGRAPHY;  Surgeon: [name], Peter M, MD;  Location: [place] CV LAB;  Service: Cardiovascular;  Laterality: N/A;  . MASS EXCISION Right 09/17/2013   Procedure: EXCISION MASS;  Surgeon: Pedro Earls, MD;  Location: [facility];  Service: General;  Laterality: Right;  . NISSEN FUNDOPLICATION  2993  . PERIPHERAL VASCULAR CATHETERIZATION N/A 11/03/2014   Procedure: Lower Extremity Angiography;  Surgeon: Lorretta Harp, MD;  Location: [place] CV LAB;  Service: Cardiovascular;  Laterality: N/A;  . PERIPHERAL VASCULAR CATHETERIZATION N/A 01/29/2016   Procedure: Lower Extremity Angiography;  Surgeon: Lorretta Harp, MD;  Location: [place] CV LAB;  Service: Cardiovascular;  Laterality: N/A;  . POLYPECTOMY  07/02/2019   Procedure: POLYPECTOMY;  Surgeon: Rogene Houston, MD;  [facility];  Service: Endoscopy;;  . POSTERIOR LUMBAR FUSION  2008 X 2  . REDUCTION MAMMAPLASTY  1996?  . TONSILLECTOMY AND ADENOIDECTOMY  1959 AND 2000  . 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  Divorced, living alone in [place].     family history includes Allergic rhinitis in her sister; Aneurysm in her sister; Breast cancer in her mother; Colon cancer in her paternal uncle; Diabetes in her father and sister; Emphysema in an other family member; Heart disease in her father, mother, and sister; Hyperlipidemia in her sister; Hypertension in her mother, sister, and sister; Stroke in her father.   Review of  Systems As per HPI  Objective:   Physical Exam BP 130/64   Pulse (!) 53   Ht 5\' 3"  (1.6 m)   Wt 136 lb (61.7 kg)   BMI 24.09 kg/m  Total time 20 minutes

## 2020-03-11 ENCOUNTER — Encounter: Payer: Self-pay | Admitting: Gastroenterology

## 2020-03-13 ENCOUNTER — Ambulatory Visit: Payer: Medicare Other | Admitting: Physical Therapy

## 2020-03-13 DIAGNOSIS — E1165 Type 2 diabetes mellitus with hyperglycemia: Secondary | ICD-10-CM | POA: Diagnosis not present

## 2020-03-15 ENCOUNTER — Other Ambulatory Visit: Payer: Self-pay | Admitting: *Deleted

## 2020-03-15 NOTE — Patient Outreach (Signed)
[facility]) Care Management  03/15/2020  XINYI BATTON [birth year] [id number]   [facility] Unsuccessful outreach  [name] was referred to [facility] on 08/02/19 byTHNhospital liaison, B Smith Referral Reason:Post hospital follow up Diagnoses of Diabetes,CKD Please refer to community telephonic RN for care management services. Insurance:united healthcare medicare  Last admission 07/28/19 to 07/30/19   Outreach attempt to the home number  No answer. [name] RN CM left HIPAA [facility] Portability and Accountability Act) compliant voicemail message along with CM's contact info.   Plan: [facility] RN CM scheduled this patient for another call attempt within 7-10 business days   Mariadelcarmen Corella L. Lavina Hamman, RN, BSN, [place] Coordinator Office number [phone] Mobile number [phone]  Main [name] number [phone] Fax number [phone]

## 2020-03-16 ENCOUNTER — Other Ambulatory Visit: Payer: Self-pay | Admitting: *Deleted

## 2020-03-16 NOTE — Patient Outreach (Addendum)
[facility] [place] [facility]) Care Management  03/16/2020  [name] 1955/09/09 [id number]   [name] successful outreach  [name] was referred to [name] on 08/02/19 byTHNhospital liaison, [name] Referral Reason:Post hospital follow up Diagnoses of Diabetes,CKD Please refer to community telephonic RN for care management services. Insurance:united healthcare medicare  Last admission 07/28/19 to 07/30/19   Follow up assessment Patient reports she is doing better  Gastrointestinal (GI)  She reports she is having stools and decrease pain  She continues the prescribed treatment Gastrointestinal (GI) plans  She cancelled her pelvic floor therapy Trying to get medicaid  Social She discussed improved social relationships with family  She discussed improved transportation (new car) She discussed coordinating independently with her insurance carrier to obtain new dentures   Diabetes  Her diabetes cbg is varying [name] RN CM discussed the goal of Diabetes management to also possibly assist with her GI issues (barrier)  She is scheduled be seen by 04/17/20 endocrinology,  [name] Discussed Helicobacter pylori and sent education information to review   Plans  [name] RN CM will follow up with pt in the next 30 business days  Pt encouraged to return a call to [name] RN CM prn Goals      Patient Stated   .  [name]) Eat Healthy (pt-stated)      Follow Up Date 04/18/20   - drink 6 to 8 glasses of water each day - fill half of plate with vegetables     Notes:     .  [name]) Manage My Diet (pt-stated)      Follow Up Date 04/18/20  - avoid foods that make my symptoms worse - drink at least 6 glasses of fluids a day - take time to eat     Notes:  present constipation, pt to administer previous discussed gastroenterology "cocktail"  of medications per her action plan Failed CT abdomen to be rescheduled with pt preference in morning and next available at any [facility]radiology location     .  [name]) Monitor and Manage My Blood Sugar (pt-stated)      Follow Up Date 04/18/20  - check blood sugar at prescribed times - check blood sugar if I feel it is too high or too low - take the blood sugar meter to all doctor visits   helpful.     Notes: reports cbg values elevated in 300s  03/16/20 report cbgs in [id number] Discussed Helicobacter pylori and sent education information to review     .  [name]) Track My Symptoms (pt-stated)      Follow Up Date 04/18/20   - track what makes symptoms worse and what makes them better       Notes: 03/16/20 GI symptoms better  Discussed Helicobacter pylori and sent education information to review         Jennifer Ercole L. Lavina Hamman, RN, BSN, [place] Coordinator Office number [phone] Main [facility] number [phone] Fax number [phone]

## 2020-03-20 ENCOUNTER — Ambulatory Visit: Payer: Medicare Other | Admitting: Physical Therapy

## 2020-03-22 ENCOUNTER — Ambulatory Visit: Payer: Self-pay | Admitting: *Deleted

## 2020-04-03 DIAGNOSIS — E1149 Type 2 diabetes mellitus with other diabetic neurological complication: Secondary | ICD-10-CM | POA: Diagnosis not present

## 2020-04-03 DIAGNOSIS — R21 Rash and other nonspecific skin eruption: Secondary | ICD-10-CM | POA: Diagnosis not present

## 2020-04-03 DIAGNOSIS — R000 Tachycardia, unspecified: Secondary | ICD-10-CM | POA: Diagnosis not present

## 2020-04-03 DIAGNOSIS — Z0001 Encounter for general adult medical examination with abnormal findings: Secondary | ICD-10-CM | POA: Diagnosis not present

## 2020-04-03 DIAGNOSIS — N1832 Chronic kidney disease, stage 3b: Secondary | ICD-10-CM | POA: Diagnosis not present

## 2020-04-03 DIAGNOSIS — T783XXA Angioneurotic edema, initial encounter: Secondary | ICD-10-CM | POA: Diagnosis not present

## 2020-04-03 DIAGNOSIS — K922 Gastrointestinal hemorrhage, unspecified: Secondary | ICD-10-CM | POA: Diagnosis not present

## 2020-04-12 DIAGNOSIS — E1165 Type 2 diabetes mellitus with hyperglycemia: Secondary | ICD-10-CM | POA: Diagnosis not present

## 2020-04-16 NOTE — Progress Notes (Signed)
Name: [name]  MRN/ DOB: [id number], January 04, 1956   Age/ Sex: 64 y.o., female    PCP: [name], MD   Reason for Endocrinology Evaluation: Type 2 Diabetes Mellitus     Date of Initial Endocrinology Visit: 04/17/2020     PATIENT IDENTIFIER: [name] is a 64 y.o. female with a past medical history of T2DM, PAD ,CAD, HTN and Hx of pancreatitis  . The patient presented for initial endocrinology clinic visit on 04/17/2020 for consultative assistance with her diabetes management.    HPI: Ms. Preyer was    Diagnosed with DM in 2003 Prior Medications tried/Intolerance: She is intolerant to Metformin due to diarrhea and Soliqua due to itching  Currently checking blood sugars multiple times a day through CGM  Hypoglycemia episodes : no               Hemoglobin A1c has ranged from 7.1%  in 20, peaking at 9.5% in 2015 Patient required assistance for hypoglycemia: no  Patient has required hospitalization within the last 1 year from hyper or hypoglycemia: Hyperglycemia 06/2019   In terms of diet, the patient eats 2 meals a day, snacks 2 , drinks coffee with sugar   Has chronic GI issues - Dr Carlean Purl   She has occasional headaches  Has right toe discomfort     HOME DIABETES REGIMEN: Humalog SS  Tresiba  40 units      Statin: yes ACE-I/ARB: yes Prior Diabetic Education: no     CONTINUOUS GLUCOSE MONITORING RECORD INTERPRETATION    Dates of Recording: 11/30-12/13/2021  Sensor description:Dexcom  Results statistics:   CGM use % of time 86  Average and SD 268/51  Time in range     2   %  % Time Above 180 38  % Time above 250 60  % Time Below target 0      Glycemic patterns summary: Hyperglycemia all day and night   Hyperglycemic episodes  Its all day and night but worse after supper   Hypoglycemic episodes occurred n/a  Overnight periods: high      DIABETIC COMPLICATIONS: Microvascular complications:   Neuropathy  Denies: CKD,  retinopathy   Last eye exam: Completed, 02/2020   Macrovascular complications:   PVD ( S/P femoral-popliteal bypass), Mild CAD   Denies: CVA   PAST HISTORY: Past Medical History:  Past Medical History:  Diagnosis Date  . 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 Allergy   . Anemia    hx  . Angioedema 07/10/2015  . Anxiety   . Arthritis    "neck, left hand" (09/14/2012)  . Asthma   . Cancer ([place]) 1985   ovarian, no treatment except surgery  . Cataract   . Complication of anesthesia    OCCASIONAL TROUBLE TURNING NECK TO RIGHT  . Critical lower limb ischemia ([place])    10/2014 s/p L SFA stenting  . Diverticulosis of colon with hemorrhage April 2013  . GERD (gastroesophageal reflux disease)   . GI bleed   . H/O hiatal hernia   . Heart attack [facility])    2003 mild MI, March 2013 mild MI  . Hidradenitis    groin  . History of blood transfusion 1985 AND 2013  . Hyperlipidemia   . Hypertension   . Irritable bowel syndrome   . Left-sided weakness    since stroke, left eye trouble seeing  . Migraines   . Mild CAD    a. Cath 09/2010: mild luminal irregularities of LAD,  30% prox RCA and 20-30% mRCA, EF 65%.  . Neuropathy   . Obesity   . Osteoporosis   . PAD (peripheral artery disease) ([place])    a. critical limb ischemia s/p PTA/stenting of L SFA 10/2014. c. occ prior SFA stent by angio 01/2016, for possible PV bypass.  . Pneumonia    baby  . Recurrent upper respiratory infection (URI)   . S/P arterial stent-mid Lt SFA 11/03/14 11/04/2014  . Schatzki's ring   . Sinus problem   . Stomach ulcer 1972   non-bleeding  . Stroke ([place]) 07-2007, 07-2008, 07-2009   total 3 strokes; mild left sided weakness and left eye "jumps".  . Type II diabetes mellitus ([place])   . Vitamin B 12 deficiency 07-15-2013   Past Surgical History:  Past Surgical History:  Procedure Laterality Date  . ABDOMINAL HYSTERECTOMY  1985  . ADENOIDECTOMY    . ANTERIOR CERVICAL DECOMP/DISCECTOMY FUSION  2002  . ANTERIOR CERVICAL  DECOMP/DISCECTOMY FUSION N/A 04/08/2014   Procedure: Cervical Six-Seven ANTERIOR CERVICAL DECOMPRESSION/DISCECTOMY FUSION Plating and Bonegraft  2 LEVELS;  Surgeon: Ashok Pall, MD;  Location: [place] [facility]S;  Service: Neurosurgery;  Laterality: N/A;  Cervical Six-Seven ANTERIOR CERVICAL DECOMPRESSION/DISCECTOMY FUSION Plating and Bonegraft  2 LEVELS  . APPENDECTOMY  1985  . AXILLARY HIDRADENITIS EXCISION  1990-2008   bilateral  . BACK SURGERY    . BREAST BIOPSY Right 2007  . BREAST CYST EXCISION Right 2008  . BREAST REDUCTION SURGERY    . CARDIAC CATHETERIZATION  2004   mild disease  . CATARACT EXTRACTION W/PHACO Left 05/16/2015   Procedure: CATARACT EXTRACTION PHACO AND INTRAOCULAR LENS PLACEMENT (IOC);  Surgeon: Rutherford Guys, MD;  Location: [facility];  Service: Ophthalmology;  Laterality: Left;  CDE: 4.24  . CATARACT EXTRACTION W/PHACO Right 05/30/2015   Procedure: CATARACT EXTRACTION RIGHT EYE PHACO AND INTRAOCULAR LENS PLACEMENT ;  Surgeon: Rutherford Guys, MD;  Location: [facility];  Service: Ophthalmology;  Laterality: Right;  CDE:4.08  . CHOLECYSTECTOMY  1990's  . COLONOSCOPY  08/12/2011   Procedure: COLONOSCOPY;  Surgeon: Ladene Artist, MD,FACG;  Location: [facility] END[facility]COPY;  Service: Endoscopy;  Laterality: N/A;  . COLONOSCOPY  06/19/2006  . COLONOSCOPY WITH PROPOFOL N/A 07/02/2019   Procedure: COLONOSCOPY WITH PROPOFOL;  Surgeon: Rogene Houston, MD;  [facility];  Service: Endoscopy;  Laterality: N/A;  . cyst thigh Right   . ESOPHAGOGASTRODUODENOSCOPY  08/12/2011   Procedure: ESOPHAGOGASTRODUODENOSCOPY (EGD);  Surgeon: Ladene Artist, MD,FACG;  Location: [facility] ENDOSCOPY;  Service: Endoscopy;  Laterality: N/A;  . ESOPHAGOGASTRODUODENOSCOPY  06/04/2005  . ESOPHAGOGASTRODUODENOSCOPY (EGD) WITH PROPOFOL N/A 07/01/2019   Procedure: ESOPHAGOGASTRODUODENOSCOPY (EGD) WITH PROPOFOL;  Surgeon: Rogene Houston, MD;  [facility];  Service: Endoscopy;  Laterality: N/A;  . FEMORAL-POPLITEAL  BYPASS GRAFT Left 06/19/2016   Procedure: Left Leg BYPASS GRAFT FEMORAL-POPLITEAL ARTERY;  Surgeon: Serafina Mitchell, MD;  Location: [place];  Service: Vascular;  Laterality: Left;  . GIVENS CAPSULE STUDY  08/13/2011   Procedure: GIVENS CAPSULE STUDY;  Surgeon: Ladene Artist, MD,FACG;  [facility] END[facility];  Service: Endoscopy;  Laterality: N/A;  . HAMMER TOE SURGERY Bilateral ~ 2000  . HEMOSTASIS CLIP PLACEMENT  07/02/2019   Procedure: HEMOSTASIS CLIP PLACEMENT;  Surgeon: Rogene Houston, MD;  [facility];  Service: Endoscopy;;  hepatic flexure  . HIATAL HERNIA REPAIR    . HYDRADENITIS EXCISION  01/2011; 03/2012   'groin and abdomen; 03/2012" (09/14/2012)  . HYDRADENITIS EXCISION  04/01/2012  Procedure: EXCISION HYDRADENITIS GROIN;  Surgeon: Pedro Earls, MD;  Location: [facility];  Service: General;  Laterality: Bilateral;  Excision of Hydradenitis of Perineum  . HYDRADENITIS EXCISION N/A 09/17/2013   Procedure: EXCISION PERINEAL HIDRADENITIS ;  Surgeon: Pedro Earls, MD;  Location: [facility];  Service: General;  Laterality: N/A;  also in the pubis area  . LEFT HEART CATH AND CORONARY ANGIOGRAPHY N/A 12/26/2016   Procedure: LEFT HEART CATH AND CORONARY ANGIOGRAPHY;  Surgeon: [name], Peter M, MD;  Location: [facility] CV LAB;  Service: Cardiovascular;  Laterality: N/A;  . MASS EXCISION Right 09/17/2013   Procedure: EXCISION MASS;  Surgeon: Pedro Earls, MD;  Location: [facility];  Service: General;  Laterality: Right;  . NISSEN FUNDOPLICATION  3149  . PERIPHERAL VASCULAR CATHETERIZATION N/A 11/03/2014   Procedure: Lower Extremity Angiography;  Surgeon: Lorretta Harp, MD;  Location: [facility] CV LAB;  Service: Cardiovascular;  Laterality: N/A;  . PERIPHERAL VASCULAR CATHETERIZATION N/A 01/29/2016   Procedure: Lower Extremity Angiography;  Surgeon: Lorretta Harp, MD;  Location: [place] CV LAB;  Service: Cardiovascular;  Laterality: N/A;  . POLYPECTOMY  07/02/2019   Procedure:  POLYPECTOMY;  Surgeon: Rogene Houston, MD;  [facility];  Service: Endoscopy;;  . POSTERIOR LUMBAR FUSION  2008 X 2  . REDUCTION MAMMAPLASTY  1996?  . TONSILLECTOMY AND ADENOIDECTOMY  1959 AND 2000  . UVULOPALATOPHARYNGOPLASTY, TONSILLECTOMY AND SEPTOPLASTY  2000's      Social History:  reports that she quit smoking about 6 weeks ago. Her smoking use included cigarettes. She has a 20.50 pack-year smoking history. She has never used smokeless tobacco. She reports that she does not drink alcohol and does not use drugs. Family History:  Family History  Problem Relation Age of Onset  . Breast cancer Mother   . Heart disease Mother   . Hypertension Mother   . Diabetes Father   . Heart disease Father   . Stroke Father   . Heart disease Sister   . Hypertension Sister   . Hypertension Sister   . Hyperlipidemia Sister   . Diabetes Sister   . Allergic rhinitis Sister   . Emphysema Other        great uncle  . Aneurysm Sister        brain  . Colon cancer Paternal Uncle   . Angioedema Neg Hx   . Asthma Neg Hx   . Eczema Neg Hx   . Immunodeficiency Neg Hx   . Urticaria Neg Hx      HOME MEDICATIONS: Allergies as of 04/17/2020      Reactions   Sulfa Antibiotics Shortness Of Breath, Palpitations   Codeine Other (See Comments)   Recovering Addict does not like to take Narcotics   Fish Allergy Hives, Swelling   Tongue swelling   Iodine Swelling   Metformin And Related Diarrhea   Soliqua [insulin Glargine-lixisenatide] Itching, Other (See Comments)   "tongue swelling"   Adhesive [tape] Rash   Paper tape is ok   Shellfish Allergy Swelling, Rash   Tongue swelling      Medication List       Accurate as of April 17, 2020  9:01 AM. If you have any questions, ask your nurse or doctor.        acetaminophen 325 MG tablet Commonly known as: TYLENOL Take 2 tablets (650 mg total) by mouth every 6 (six) hours as needed for mild pain (or Fever >/= 101).   albuterol  108 (90 Base) MCG/ACT inhaler Commonly known as: ProAir HFA 1-2 inhalations every 4-6 hours as needed for cough or wheeze. What changed:   how much to take  how to take this  when to take this  reasons to take this  additional instructions   ALPRAZolam 0.5 MG tablet Commonly known as: XANAX Take 0.5 mg by mouth 2 (two) times daily as needed for anxiety.   Azelastine HCl 0.15 % Soln Place 2 sprays into both nostrils 2 (two) times daily. What changed:   when to take this  reasons to take this   bisacodyl 5 MG EC tablet Commonly known as: DULCOLAX Take 5 mg by mouth daily as needed for moderate constipation.   citalopram 10 MG tablet Commonly known as: CELEXA Take 10 mg by mouth daily as needed.   clopidogrel 75 MG tablet Commonly known as: PLAVIX Take 1 tablet (75 mg total) by mouth daily.   Dexcom G6 Receiver Devi by Does not apply route.   EPINEPHrine 0.3 mg/0.3 mL Soaj injection Commonly known as: EPI-PEN Inject 0.3 mLs (0.3 mg total) into the muscle once.   Fluocinolone Acetonide Body 0.01 % Oil Apply topically 2 (two) times daily.   insulin lispro 100 UNIT/ML injection Commonly known as: HUMALOG Inject 5-12 Units into the skin 2 (two) times daily before a meal. Per sliding scale using OMNIPOD   losartan-hydrochlorothiazide 50-12.5 MG tablet Commonly known as: Hyzaar Take 1 tablet by mouth daily.   Magnesium 200 MG Tabs Take 1 tablet (200 mg total) by mouth daily.   mupirocin ointment 2 % Commonly known as: BACTROBAN Apply 1 application topically 2 (two) times daily. What changed:   when to take this  reasons to take this   nitroGLYCERIN 0.4 MG SL tablet Commonly known as: NITROSTAT PLACE 1 TAB UNDER TONGUE EVERY 5 MINS AS NEEDED FOR CHEST PAIN - MAX 3 DOSES THEN 911   ondansetron 4 MG tablet Commonly known as: Zofran Take 1 tablet (4 mg total) by mouth daily as needed for nausea or vomiting.   pantoprazole 40 MG tablet Commonly known  as: PROTONIX TAKE 1 TABLET BY MOUTH EVERY DAY   polyethylene glycol 17 g packet Commonly known as: MIRALAX / GLYCOLAX Take 17 g by mouth daily as needed for moderate constipation or severe constipation.   potassium chloride 10 MEQ tablet Commonly known as: KLOR-CON Take 10 mEq by mouth daily.   propranolol ER 60 MG 24 hr capsule Commonly known as: INDERAL LA Take 1 capsule (60 mg total) by mouth every evening.   rosuvastatin 40 MG tablet Commonly known as: CRESTOR Take 1 tablet (40 mg total) by mouth every morning.   SUMAtriptan 100 MG tablet Commonly known as: IMITREX Take 100 mg by mouth every 2 (two) hours as needed for migraine.   temazepam 30 MG capsule Commonly known as: RESTORIL Take 30 mg by mouth at bedtime as needed for sleep.   traZODone 100 MG tablet Commonly known as: DESYREL Take 1 tablet (100 mg total) by mouth at bedtime.   Tresiba 100 UNIT/ML Soln Generic drug: Insulin Degludec SMARTSIG:35 Unit(s) SUB-Q Daily        ALLERGIES: Allergies  Allergen Reactions  . Sulfa Antibiotics Shortness Of Breath and Palpitations  . 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 Fish Allergy Hives and Swelling    Tongue swelling  . Iodine Swelling  . Metformin And Related Diarrhea  . [name] Marcellina Millin  Glargine-Lixisenatide] Itching and Other (See Comments)    "tongue swelling"  . Adhesive [Tape] Rash    Paper tape is ok  . Shellfish Allergy Swelling and Rash    Tongue swelling     REVIEW OF SYSTEMS: A comprehensive ROS was conducted with the patient and is negative except as per HPI   OBJECTIVE:   VITAL SIGNS: BP 140/70   Pulse 65   Ht 5\' 3"  (1.6 m)   Wt 136 lb 6 oz (61.9 kg)   SpO2 97%   BMI 24.16 kg/m    PHYSICAL EXAM:  General: Pt appears well and is in NAD  Neck: General: Supple without adenopathy or carotid bruits. Thyroid: Thyroid size normal.  No goiter or nodules appreciated.  Lungs: Clear with  good BS bilat with no rales, rhonchi, or wheezes  Heart: RRR   Abdomen: Normoactive bowel sounds, soft, nontender, without masses or organomegaly palpable  Extremities:  Lower extremities - No pretibial edema. No lesions.  Skin: No rash noted.   Neuro: MS is good with appropriate affect, pt is alert and Ox3    DM foot exam: 04/17/2020  The skin of the feet is intact without sores or ulcerations. The pedal pulses are undetectable The sensation is intact to a screening 5.07, 10 gram monofilament bilaterally    DATA REVIEWED:  Lab Results  Component Value Date   HGBA1C 9.1 (H) 06/28/2019   HGBA1C 8.6 (H) 06/20/2019   HGBA1C 7.3 (H) 06/19/2016   Lab Results  Component Value Date   MICROALBUR 1.86 12/06/2009   LDLCALC 36 07/06/2011   CREATININE 0.66 02/20/2020   No results found for: [facility]  Lab Results  Component Value Date   CHOL 103 07/06/2011   HDL 32 (L) 07/06/2011   LDLCALC 36 07/06/2011   TRIG 176 (H) 07/06/2011   CHOLHDL 3.2 07/06/2011        ASSESSMENT / PLAN / RECOMMENDATIONS:   1) Type 2 Diabetes Mellitus, Poorly controlled, With Neuropathic and macrovascular complications -  Goal D2K < 7.0 %.     - Her most recent A1c from last week is not available , back in July 2021 her A1c was 8.2 % Her CGM down load shows an A1c equivalents of 9.7 %  - Has Hx of pancreatitis in 2010, so DPP-4 inhibitors and GLp-1 agonists are CONTRAINDICATED - Discussed pharmacokinetics of basal/bolus insulin and the importance of taking prandial insulin with meals.  We also discussed avoiding sugar-sweetened beverages and snacks, when possible.  - Will make the following adjustments  - She will be referred to our CDE    MEDICATIONS: - Tresiba 40 units daily  - Humalog  10  units with each meal - [facility]: Humalog ( BG-130/30)  EDUCATION / INSTRUCTIONS:  BG monitoring instructions: Patient is instructed to check her blood sugars 4 times a day, before meals and bedtime  .  Call [facility] if: BG persistently < 70  . I reviewed the Rule of 15 for the treatment of hypoglycemia in detail with the patient. Literature supplied.   2) Diabetic complications:   Eye: Does not have known diabetic retinopathy.   Neuro/ Feet: Does  have known diabetic peripheral neuropathy.  Renal: Patient does not have known baseline CKD. She is  on an ACEI/ARB at present.  3) Dyslipidemia : Patient is on rosuvastatin 40 mg daily .    F/U in 6 weeks     Signed electronically by: Mack Guise, MD  [facility] Endocrinology  [facility] [address] [place], [place] [address] Phone: [phone] FAX: [phone]   CC: [name], MD [place] [address] Phone: [phone]  Fax: [phone]    Return to Endocrinology clinic as below: Future Appointments  Date Time Provider [place]  04/18/2020  4:00 PM Barbaraann Faster, RN [name] None  05/31/2020  3:40 PM Fumi Guadron, Melanie Crazier, MD [facility] None

## 2020-04-17 ENCOUNTER — Encounter: Payer: Self-pay | Admitting: Internal Medicine

## 2020-04-17 ENCOUNTER — Other Ambulatory Visit: Payer: Self-pay

## 2020-04-17 ENCOUNTER — Ambulatory Visit: Payer: Medicare Other | Admitting: Internal Medicine

## 2020-04-17 VITALS — BP 140/70 | HR 65 | Ht 63.0 in | Wt 136.4 lb

## 2020-04-17 DIAGNOSIS — E1140 Type 2 diabetes mellitus with diabetic neuropathy, unspecified: Secondary | ICD-10-CM | POA: Diagnosis not present

## 2020-04-17 DIAGNOSIS — E1159 Type 2 diabetes mellitus with other circulatory complications: Secondary | ICD-10-CM | POA: Diagnosis not present

## 2020-04-17 DIAGNOSIS — E1142 Type 2 diabetes mellitus with diabetic polyneuropathy: Secondary | ICD-10-CM | POA: Diagnosis not present

## 2020-04-17 DIAGNOSIS — I739 Peripheral vascular disease, unspecified: Secondary | ICD-10-CM | POA: Diagnosis not present

## 2020-04-17 DIAGNOSIS — G4700 Insomnia, unspecified: Secondary | ICD-10-CM | POA: Diagnosis not present

## 2020-04-17 DIAGNOSIS — E1165 Type 2 diabetes mellitus with hyperglycemia: Secondary | ICD-10-CM | POA: Diagnosis not present

## 2020-04-17 DIAGNOSIS — I10 Essential (primary) hypertension: Secondary | ICD-10-CM | POA: Diagnosis not present

## 2020-04-17 DIAGNOSIS — Z794 Long term (current) use of insulin: Secondary | ICD-10-CM | POA: Diagnosis not present

## 2020-04-17 DIAGNOSIS — E119 Type 2 diabetes mellitus without complications: Secondary | ICD-10-CM | POA: Insufficient documentation

## 2020-04-17 NOTE — Patient Instructions (Addendum)
-   Tresiba 40 units daily  - Humalog  10  units with each meal  -Humalog correctional insulin: ADD extra units on insulin to your meal-time  Humalog  dose if your blood sugars are higher than 160 . Use the scale below to help guide you:   Blood sugar before meal Number of units to inject  Less than 160 0 unit  161 -  190 1 units  191 -  220 2 units  221 -  250 3 units  251 -  280 4 units  281 -  310 5 units  311 -  340 6 units  341 -  370 7 units  371 -  400 8 units     HOW TO TREAT LOW BLOOD SUGARS (Blood sugar LESS THAN 70 MG/DL)  Please follow the RULE OF 15 for the treatment of hypoglycemia treatment (when your (blood sugars are less than 70 mg/dL)    STEP 1: Take 15 grams of carbohydrates when your blood sugar is low, which includes:   3-4 GLUCOSE TABS  OR  3-4 OZ OF JUICE OR REGULAR SODA OR  ONE TUBE OF GLUCOSE GEL     STEP 2: RECHECK blood sugar in 15 MINUTES STEP 3: If your blood sugar is still low at the 15 minute recheck --> then, go back to STEP 1 and treat AGAIN with another 15 grams of carbohydrates.

## 2020-04-18 ENCOUNTER — Other Ambulatory Visit: Payer: Self-pay | Admitting: *Deleted

## 2020-04-18 NOTE — Patient Outreach (Signed)
[name]) Care Management  04/18/2020  [name] 1956/04/26 [id number]   [name] successful outreach  [name] was referred to [facility] on 08/02/19 by[facility]Nhospital liaison, [name] Referral Reason:Post hospital follow up Diagnoses of Diabetes,CKD Please refer to community telephonic RN for care management services. Insurance:[employer] medicare  Last admission 07/28/19 to 07/30/19   Follow up assessment Patient reports she is doing better  Gastrointestinal (GI)  GI still having some GI symptoms Lost more weight  [name] RN CM inquired but she did not state wt value She discussed eating ice cream today  [name] RN CM cautioned her on her diet choices and encouraged supplements  Going to [employer] to get boost supplements   Depression Saw [name] on 04/17/20 Pt admits to depression symptoms and spoke with [name]  Has not been sleeping well  Will go back to [name] for cognitive behavioral therapy (CBT Encourage cognitive behavioral therapy (CBT   Hip Pain continues   Social now has a part time job She states this will get her out of her home Start on 04/19/20  Still having social issues with her children        Plans  [facility] RN CM will follow up with pt in the next 30 business days  Pt encouraged to return a call to [place] CM prn   [name] L. Jennifer Hamman, RN, BSN, [place] Coordinator Office number [phone] Main [name] number [phone] Fax number [phone]

## 2020-05-08 DIAGNOSIS — U071 COVID-19: Secondary | ICD-10-CM | POA: Diagnosis not present

## 2020-05-08 DIAGNOSIS — R61 Generalized hyperhidrosis: Secondary | ICD-10-CM | POA: Diagnosis not present

## 2020-05-08 DIAGNOSIS — J309 Allergic rhinitis, unspecified: Secondary | ICD-10-CM | POA: Diagnosis not present

## 2020-05-09 DIAGNOSIS — K922 Gastrointestinal hemorrhage, unspecified: Secondary | ICD-10-CM | POA: Diagnosis not present

## 2020-05-09 DIAGNOSIS — J060 Acute laryngopharyngitis: Secondary | ICD-10-CM | POA: Diagnosis not present

## 2020-05-09 DIAGNOSIS — R21 Rash and other nonspecific skin eruption: Secondary | ICD-10-CM | POA: Diagnosis not present

## 2020-05-09 DIAGNOSIS — J069 Acute upper respiratory infection, unspecified: Secondary | ICD-10-CM | POA: Diagnosis not present

## 2020-05-09 DIAGNOSIS — R000 Tachycardia, unspecified: Secondary | ICD-10-CM | POA: Diagnosis not present

## 2020-05-09 DIAGNOSIS — Z0001 Encounter for general adult medical examination with abnormal findings: Secondary | ICD-10-CM | POA: Diagnosis not present

## 2020-05-13 DIAGNOSIS — E1165 Type 2 diabetes mellitus with hyperglycemia: Secondary | ICD-10-CM | POA: Diagnosis not present

## 2020-05-17 ENCOUNTER — Encounter: Payer: Self-pay | Admitting: *Deleted

## 2020-05-17 ENCOUNTER — Other Ambulatory Visit: Payer: Self-pay | Admitting: *Deleted

## 2020-05-17 NOTE — Patient Outreach (Signed)
[name]) Care Management  05/17/2020  [name] 1955/10/26 [id number]   [name] successful outreach  [name] was referred to [name] on 08/02/19 byTHNhospital liaison, B [name] Referral Reason:Post [name] follow up Diagnoses of Diabetes,CKD Please refer to community telephonic RN for care management services. 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 Has a therapist/counselor telephonically  Seeing endocrinologist Dr Kelton Pillar who will be assisting her to be followed by a nutritionist 04/17/20 BMI 24.16 last HgA1c 7.1 on 04/17/20 was 8.3  And 11.5 in November 2021 Her GI symptoms remain but are manageable Colon polyps found in October-November 2021 were benign, removed to follow up in 3 years  She is attempting to make better food choices Has all covid vaccines Had flu shot at [facility] on same day as moderna covid booster was given- 12/06/19 She is being followed by [name] and has scales, BP cuff and a electronic tablet that is being monitored by [employer] staff     Today she reports Cold symptoms reported that was confirmed as flu symptoms  Had covid test last week that was negative via her pcp  During this outreach, she left a message at pcp office for her albuterol renewal but also has prn nasal inhaler   Social  She states she is getting out of her home more. Her church members and friends are her supports system  Still having relation social issues with her daughter and grand daughter but is learning how to set boundaries  Patient Active Problem List   Diagnosis Date Noted  . Type 2 diabetes mellitus with diabetic polyneuropathy, with long-term current use of insulin  ([name]) 04/17/2020  . Diabetes mellitus ([name]) 04/17/2020  . Generalized abdominal pain 12/15/2019  . AKI (acute kidney injury) ([name]) 07/28/2019  . Hypokalemia 06/28/2019  . Generalized weakness 06/28/2019  . Syncope and collapse 06/20/2019  . Tobacco abuse 05/04/2019  . Diabetic gastroparesis associated with type 2 diabetes mellitus ([place]) 11/25/2017  . Precordial chest pain 12/24/2016  . Nausea & vomiting 12/24/2016  . PAD (peripheral artery disease) ([name]) 06/19/2016  . Diabetic neuropathy ([name]) 01/31/2016  . Obesity 01/31/2016  . Mild CAD   . Claudication ([name]) 01/29/2016  . Food allergy 08/02/2015  . Recurrent urticaria 07/10/2015  . Angioedema 07/10/2015  . Perennial allergic rhinitis with a possible non-allergic component 07/10/2015  . History of asthma 07/10/2015  . S/P femoral-popliteal bypass surgery 11/04/2014  . Critical lower limb ischemia ([name]) 11/03/2014  . Peripheral arterial disease ([name]) 11/02/2014  . HNP (herniated nucleus pulposus) with myelopathy, cervical 04/11/2014  . HNP (herniated nucleus pulposus), cervical 04/08/2014  . Pancreatitis 01/02/2013  . Abdominal pain, epigastric 12/14/2012  . IBS (irritable bowel syndrome) 09/12/2011  . GERD (gastroesophageal reflux disease) 09/12/2011  . Diverticulosis of colon with hemorrhage 08/10/2011  . Chest pain at rest 07/06/2011  . Hidradenitis, left pubic area and left lower quadrant 01/18/2011  . DM2 (diabetes mellitus, type 2) ([name]) 08/24/2007  . Hyperlipidemia LDL goal <70 08/24/2007  . Essential hypertension 08/24/2007      Plans Patient agrees to care plan and follow up [name] will follow up with pt in the next 90-100 business days Pt encouraged to return a call to [name]  prn Routed note to MD   Goals Addressed              This Visit's Progress     Patient Stated   .  [name]) Eat Healthy (pt-stated)   On track     Follow Up Date 08/15/20   - drink 6 to 8 glasses of water each day -  fill half of plate with vegetables    Notes:  05/17/20 Her GI symptoms remain but are manageable Colon polyps found in October-November 2021 were benign, removed to follow up in 3 years  She is attempting to make better food choices    .  [name]) Manage My Diet (pt-stated)   On track     Follow Up Date 08/15/20 - avoid foods that make my symptoms worse - drink at least 6 glasses of fluids a day - take time to eat   Notes:  05/17/20 Her GI symptoms remain but are manageable Colon polyps found in October-November 2021 were benign, removed to follow up in 3 years  She is attempting to make better food choices 04/18/20 present constipation, pt to administer previous discussed gastroenterology "cocktail"  of medications per her action plan Failed CT abdomen to be rescheduled with pt preference in morning and next available at any [facility] location     .  [name]) Monitor and Manage My Blood Sugar (pt-stated)   On track     Follow Up Date 08/15/20 - check blood sugar at prescribed times - check blood sugar if I feel it is too high or too low - take the blood sugar meter to all doctor visits     Notes: 05/17/20 Seeing endocrinologist Dr Kelton Pillar who will be assisting her to be followed by a nutritionist 04/17/20 BMI 24.16 last HgA1c 7.1 on 04/17/20 was 8.3  And 11.5 in November 2021 12/21 reports cbg values elevated in 300s  03/16/20 report cbgs in [id number] Discussed Helicobacter pylori and sent education information to review     .  COMPLETED: [name]) Track My Symptoms (pt-stated)   On track     Follow Up Date 05/17/20   - track what makes symptoms worse and what makes them better      Notes: 05/17/20 met She is being followed by [name] and has scales, BP cuff and a electronic tablet that is being monitored by [employer] staff  03/16/20 GI symptoms better  Discussed Helicobacter pylori and sent education information to review        Jennifer L. Lavina Hamman, RN, BSN,  [place] Coordinator Office number [phone] [name] number [phone] Fax number [phone]

## 2020-05-31 ENCOUNTER — Other Ambulatory Visit: Payer: Self-pay

## 2020-05-31 ENCOUNTER — Encounter: Payer: Self-pay | Admitting: Internal Medicine

## 2020-05-31 ENCOUNTER — Other Ambulatory Visit: Payer: Self-pay | Admitting: *Deleted

## 2020-05-31 ENCOUNTER — Ambulatory Visit (INDEPENDENT_AMBULATORY_CARE_PROVIDER_SITE_OTHER): Payer: Medicare Other | Admitting: Internal Medicine

## 2020-05-31 VITALS — BP 156/72 | HR 113 | Ht 63.0 in | Wt 129.0 lb

## 2020-05-31 DIAGNOSIS — E1165 Type 2 diabetes mellitus with hyperglycemia: Secondary | ICD-10-CM

## 2020-05-31 DIAGNOSIS — Z794 Long term (current) use of insulin: Secondary | ICD-10-CM | POA: Diagnosis not present

## 2020-05-31 LAB — POCT GLYCOSYLATED HEMOGLOBIN (HGB A1C): Hemoglobin A1C: 11 % — AB (ref 4.0–5.6)

## 2020-05-31 LAB — BASIC METABOLIC PANEL
BUN: 12 mg/dL (ref 6–23)
CO2: 29 mEq/L (ref 19–32)
Calcium: 10 mg/dL (ref 8.4–10.5)
Chloride: 102 mEq/L (ref 96–112)
Creatinine, Ser: 0.74 mg/dL (ref 0.40–1.20)
GFR: 84.93 mL/min (ref >60.00)
Glucose, Bld: 260 mg/dL — ABNORMAL HIGH (ref 70–99)
Potassium: 4 mEq/L (ref 3.5–5.1)
Sodium: 137 mEq/L (ref 135–145)

## 2020-05-31 MED ORDER — EMPAGLIFLOZIN 10 MG PO TABS: 10.0000 mg | ORAL_TABLET | Freq: Every day | ORAL | 6 refills | Status: DC

## 2020-05-31 NOTE — Progress Notes (Signed)
Name: [name]  Age/ Sex: 65 y.o., female   MRN/ DOB: [id number], [birth year]     PCP: [name], MD   Reason for Endocrinology Evaluation: Type 2 Diabetes Mellitus  Initial Endocrine Consultative Visit: 04/17/2020    PATIENT IDENTIFIER: [name] is a 65 y.o. female with a past medical history of T2DM, PAD ,CAD, HTN and Hx of pancreatitis  . The patient has followed with Endocrinology clinic since 04/17/2020 for consultative assistance with management of her diabetes.  DIABETIC HISTORY:  [name] was diagnosed with DM in 2003, She is intolerant to Metformin due to diarrhea and Soliqua due to itching . Her hemoglobin A1c has ranged from 7.1%  in 20, peaking at 9.5% in 2015    Has chronic GI issues - Dr Carlean Purl   SUBJECTIVE:   During the last visit (04/17/2020):  We didn't do an A1c because      Today (06/02/2020): [name] is here for diabetes follow up .  She checks her blood sugars multiple times a day through CGM. The patient has not had hypoglycemic episodes since the last clinic visit.      HOME DIABETES REGIMEN:  Tresiba 40 units daily   Humalog  10  units with each meal CCF: Humalog ( BG-130/30)     Statin: yes ACE-I/ARB: yes    CONTINUOUS GLUCOSE MONITORING RECORD INTERPRETATION    Dates of Recording: 1/13-2/26/2022  Sensor description:dexcom  Results statistics:   CGM use % of time 93  Average and SD 216/54  Time in range 20       %  % Time Above 180 61  % Time above 250 18  % Time Below target <1    Glycemic patterns summary: Hyperglycemia noted through the night and most of the day, BG's improve by around 4 pm  Hyperglycemic episodes  Overnight and mornings  Hypoglycemic episodes occurred postprandial  Overnight periods: hyperglycemia       DIABETIC COMPLICATIONS: Microvascular complications:   Neuropathy   Denies: CKD, retinopathy  Last Eye Exam: Completed   Macrovascular complications:   PVD  ( S/P femoral-popliteal bypass), Mild CAD   Denies: CAD, CVA   HISTORY:  Past Medical History:  Past Medical History:  Diagnosis Date  . 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 Allergy   . Anemia    hx  . Angioedema 07/10/2015  . Anxiety   . Arthritis    "neck, left hand" (09/14/2012)  . Asthma   . Cancer ([place]) 1985   ovarian, no treatment except surgery  . Cataract   . Complication of anesthesia    OCCASIONAL TROUBLE TURNING NECK TO RIGHT  . Critical lower limb ischemia ([place])    10/2014 s/p L SFA stenting  . Diverticulosis of colon with hemorrhage April 2013  . GERD (gastroesophageal reflux disease)   . GI bleed   . H/O hiatal hernia   . Heart attack [facility])    2003 mild MI, March 2013 mild MI  . Hidradenitis    groin  . History of blood transfusion 1985 AND 2013  . Hyperlipidemia   . Hypertension   . Irritable bowel syndrome   . Left-sided weakness    since stroke, left eye trouble seeing  . Migraines   . Mild CAD    a. Cath 09/2010: mild luminal irregularities of LAD, 30% prox RCA and 20-30% mRCA, EF 65%.  . Neuropathy   . Obesity   . Osteoporosis   . PAD (peripheral  artery disease) ([place])    a. critical limb ischemia s/p PTA/stenting of L SFA 10/2014. c. occ prior SFA stent by angio 01/2016, for possible PV bypass.  . Pneumonia    baby  . Recurrent upper respiratory infection (URI)   . S/P arterial stent-mid Lt SFA 11/03/14 11/04/2014  . Schatzki's ring   . Sinus problem   . Stomach ulcer 1972   non-bleeding  . Stroke ([place]) 07-2007, 07-2008, 07-2009   total 3 strokes; mild left sided weakness and left eye "jumps".  . Type II diabetes mellitus ([place])   . Vitamin B 12 deficiency 07-15-2013   Past Surgical History:  Past Surgical History:  Procedure Laterality Date  . ABDOMINAL HYSTERECTOMY  1985  . ADENOIDECTOMY    . ANTERIOR CERVICAL DECOMP/DISCECTOMY FUSION  2002  . ANTERIOR CERVICAL DECOMP/DISCECTOMY FUSION N/A 04/08/2014   Procedure: Cervical Six-Seven ANTERIOR CERVICAL  DECOMPRESSION/DISCECTOMY FUSION Plating and Bonegraft  2 LEVELS;  Surgeon: Ashok Pall, MD;  Location: [place] NEURO ORS;  Service: Neurosurgery;  Laterality: N/A;  Cervical Six-Seven ANTERIOR CERVICAL DECOMPRESSION/DISCECTOMY FUSION Plating and Bonegraft  2 LEVELS  . APPENDECTOMY  1985  . AXILLARY HIDRADENITIS EXCISION  1990-2008   bilateral  . BACK SURGERY    . BREAST BIOPSY Right 2007  . BREAST CYST EXCISION Right 2008  . BREAST REDUCTION SURGERY    . CARDIAC CATHETERIZATION  2004   mild disease  . CATARACT EXTRACTION W/PHACO Left 05/16/2015   Procedure: CATARACT EXTRACTION PHACO AND INTRAOCULAR LENS PLACEMENT (IOC);  Surgeon: Rutherford Guys, MD;  Location[facility];  Service: Ophthalmology;  Laterality: Left;  CDE: 4.24  . CATARACT EXTRACTION W/PHACO Right 05/30/2015   Procedure: CATARACT EXTRACTION RIGHT EYE PHACO AND INTRAOCULAR LENS PLACEMENT ;  Surgeon: Rutherford Guys, MD;  Location: [facility];  Service: Ophthalmology;  Laterality: Right;  CDE:4.08  . CHOLECYSTECTOMY  1990's  . COLONOSCOPY  08/12/2011   Procedure: COLONOSCOPY;  Surgeon: Ladene Artist, MD,FACG;  [facility] ENDOSCOPY;  Service: Endoscopy;  Laterality: N/A;  . COLONOSCOPY  06/19/2006  . COLONOSCOPY WITH PROPOFOL N/A 07/02/2019   Procedure: COLONOSCOPY WITH PROPOFOL;  Surgeon: Rogene Houston, MD;  [facility];  Service: Endoscopy;  Laterality: N/A;  . cyst thigh Right   . ESOPHAGOGASTRODUODENOSCOPY  08/12/2011   Procedure: ESOPHAGOGASTRODUODENOSCOPY (EGD);  Surgeon: Ladene Artist, MD,FACG;  Location: [facility] ENDOSCOPY;  Service: Endoscopy;  Laterality: N/A;  . ESOPHAGOGASTRODUODENOSCOPY  06/04/2005  . ESOPHAGOGASTRODUODENOSCOPY (EGD) WITH PROPOFOL N/A 07/01/2019   Procedure: ESOPHAGOGASTRODUODENOSCOPY (EGD) WITH PROPOFOL;  Surgeon: Rogene Houston, MD;  [facility];  Service: Endoscopy;  Laterality: N/A;  . FEMORAL-POPLITEAL BYPASS GRAFT Left 06/19/2016   Procedure: Left Leg BYPASS GRAFT FEMORAL-POPLITEAL ARTERY;   Surgeon: Serafina Mitchell, MD;  Location: [place];  Service: Vascular;  Laterality: Left;  . GIVENS CAPSULE STUDY  08/13/2011   Procedure: GIVENS CAPSULE STUDY;  Surgeon: Ladene Artist, MD,FACG;  Location[facility] ENDOSCOPY;  Service: Endoscopy;  Laterality: N/A;  . HAMMER TOE SURGERY Bilateral ~ 2000  . HEMOSTASIS CLIP PLACEMENT  07/02/2019   Procedure: HEMOSTASIS CLIP PLACEMENT;  Surgeon: Rogene Houston, MD;  [facility];  Service: Endoscopy;;  hepatic flexure  . HIATAL HERNIA REPAIR    . HYDRADENITIS EXCISION  01/2011; 03/2012   'groin and abdomen; 03/2012" (09/14/2012)  . HYDRADENITIS EXCISION  04/01/2012   Procedure: EXCISION HYDRADENITIS GROIN;  Surgeon: Pedro Earls, MD;  Location: [facility];  Service: General;  Laterality: Bilateral;  Excision of  Hydradenitis of Perineum  . HYDRADENITIS EXCISION N/A 09/17/2013   Procedure: EXCISION PERINEAL HIDRADENITIS ;  Surgeon: Pedro Earls, MD;  Location: [facility];  Service: General;  Laterality: N/A;  also in the pubis area  . LEFT HEART CATH AND CORONARY ANGIOGRAPHY N/A 12/26/2016   Procedure: LEFT HEART CATH AND CORONARY ANGIOGRAPHY;  Surgeon: [name], Peter M, MD;  Location: [place] CV LAB;  Service: Cardiovascular;  Laterality: N/A;  . MASS EXCISION Right 09/17/2013   Procedure: EXCISION MASS;  Surgeon: Pedro Earls, MD;  Location: [facility];  Service: General;  Laterality: Right;  . NISSEN FUNDOPLICATION  99991111  . PERIPHERAL VASCULAR CATHETERIZATION N/A 11/03/2014   Procedure: Lower Extremity Angiography;  Surgeon: Lorretta Harp, MD;  Location: [place] CV LAB;  Service: Cardiovascular;  Laterality: N/A;  . PERIPHERAL VASCULAR CATHETERIZATION N/A 01/29/2016   Procedure: Lower Extremity Angiography;  Surgeon: Lorretta Harp, MD;  Location: [place] CV LAB;  Service: Cardiovascular;  Laterality: N/A;  . POLYPECTOMY  07/02/2019   Procedure: POLYPECTOMY;  Surgeon: Rogene Houston, MD;  [facility];  Service:  Endoscopy;;  . POSTERIOR LUMBAR FUSION  2008 X 2  . REDUCTION MAMMAPLASTY  1996?  . TONSILLECTOMY AND ADENOIDECTOMY  1959 AND 2000  . UVULOPALATOPHARYNGOPLASTY, TONSILLECTOMY AND SEPTOPLASTY  2000's    Social History:  reports that she quit smoking about 3 months ago. Her smoking use included cigarettes. She has a 20.50 pack-year smoking history. She has never used smokeless tobacco. She reports that she does not drink alcohol and does not use drugs. Family History:  Family History  Problem Relation Age of Onset  . Breast cancer Mother   . Heart disease Mother   . Hypertension Mother   . Diabetes Father   . Heart disease Father   . Stroke Father   . Heart disease Sister   . Hypertension Sister   . Hypertension Sister   . Hyperlipidemia Sister   . Diabetes Sister   . Allergic rhinitis Sister   . Emphysema Other        great uncle  . Aneurysm Sister        brain  . Colon cancer Paternal Uncle   . Angioedema Neg Hx   . Asthma Neg Hx   . Eczema Neg Hx   . Immunodeficiency Neg Hx   . Urticaria Neg Hx      HOME MEDICATIONS: Allergies as of 05/31/2020      Reactions   Sulfa Antibiotics Shortness Of Breath, Palpitations   Codeine Other (See Comments)   Recovering Addict does not like to take Narcotics   Fish Allergy Hives, Swelling   Tongue swelling   Iodine Swelling   Metformin And Related Diarrhea   Soliqua [insulin Glargine-lixisenatide] Itching, Other (See Comments)   "tongue swelling"   Adhesive [tape] Rash   Paper tape is ok   Shellfish Allergy Swelling, Rash   Tongue swelling      Medication List       Accurate as of May 31, 2020 11:59 PM. If you have any questions, ask your nurse or doctor.        acetaminophen 325 MG tablet Commonly known as: TYLENOL Take 2 tablets (650 mg total) by mouth every 6 (six) hours as needed for mild pain (or Fever >/= 101).   albuterol 108 (90 Base) MCG/ACT inhaler Commonly known as: ProAir HFA 1-2 inhalations every  4-6 hours as needed for cough or wheeze. What changed:  how much to take  how to take this  when to take this  reasons to take this  additional instructions   ALPRAZolam 0.5 MG tablet Commonly known as: XANAX Take 0.5 mg by mouth 2 (two) times daily as needed for anxiety.   Azelastine HCl 0.15 % Soln Place 2 sprays into both nostrils 2 (two) times daily. What changed:   when to take this  reasons to take this   BD Pen Needle Nano 2nd Gen 32G X 4 MM Misc Generic drug: Insulin Pen Needle   bisacodyl 5 MG EC tablet Commonly known as: DULCOLAX Take 5 mg by mouth daily as needed for moderate constipation.   citalopram 10 MG tablet Commonly known as: CELEXA Take 10 mg by mouth daily as needed.   citalopram 20 MG tablet Commonly known as: CELEXA Take 20 mg by mouth daily.   clopidogrel 75 MG tablet Commonly known as: PLAVIX Take 1 tablet (75 mg total) by mouth daily.   Dexcom G6 Receiver Devi by Does not apply route.   empagliflozin 10 MG Tabs tablet Commonly known as: Jardiance Take 1 tablet (10 mg total) by mouth daily before breakfast. Started by: Dorita Sciara, MD   EPINEPHrine 0.3 mg/0.3 mL Soaj injection Commonly known as: EPI-PEN Inject 0.3 mLs (0.3 mg total) into the muscle once.   Fluocinolone Acetonide Body 0.01 % Oil Apply topically 2 (two) times daily.   insulin lispro 100 UNIT/ML injection Commonly known as: HUMALOG Inject 5-12 Units into the skin 2 (two) times daily before a meal. Per sliding scale using OMNIPOD   levocetirizine 5 MG tablet Commonly known as: XYZAL Take 5 mg by mouth daily.   losartan-hydrochlorothiazide 50-12.5 MG tablet Commonly known as: Hyzaar Take 1 tablet by mouth daily.   Magnesium 200 MG Tabs Take 1 tablet (200 mg total) by mouth daily.   mupirocin ointment 2 % Commonly known as: BACTROBAN Apply 1 application topically 2 (two) times daily. What changed:   when to take this  reasons to take this    nitroGLYCERIN 0.4 MG SL tablet Commonly known as: NITROSTAT PLACE 1 TAB UNDER TONGUE EVERY 5 MINS AS NEEDED FOR CHEST PAIN - MAX 3 DOSES THEN 911   ondansetron 4 MG tablet Commonly known as: Zofran Take 1 tablet (4 mg total) by mouth daily as needed for nausea or vomiting.   pantoprazole 40 MG tablet Commonly known as: PROTONIX TAKE 1 TABLET BY MOUTH EVERY DAY   polyethylene glycol 17 g packet Commonly known as: MIRALAX / GLYCOLAX Take 17 g by mouth daily as needed for moderate constipation or severe constipation.   potassium chloride 10 MEQ tablet Commonly known as: KLOR-CON Take 10 mEq by mouth daily.   propranolol ER 60 MG 24 hr capsule Commonly known as: INDERAL LA Take 1 capsule (60 mg total) by mouth every evening.   rosuvastatin 40 MG tablet Commonly known as: CRESTOR Take 1 tablet (40 mg total) by mouth every morning.   SUMAtriptan 100 MG tablet Commonly known as: IMITREX Take 100 mg by mouth every 2 (two) hours as needed for migraine.   temazepam 30 MG capsule Commonly known as: RESTORIL Take 30 mg by mouth at bedtime as needed for sleep.   traZODone 100 MG tablet Commonly known as: DESYREL Take 1 tablet (100 mg total) by mouth at bedtime.   Tyler Aas FlexTouch 200 UNIT/ML FlexTouch Pen Generic drug: insulin degludec Inject 40 Units into the skin.  OBJECTIVE:   Vital Signs: BP (!) 156/72   Pulse (!) 113   Ht 5\' 3"  (1.6 m)   Wt 129 lb (58.5 kg)   SpO2 97%   BMI 22.85 kg/m   Wt Readings from Last 3 Encounters:  05/31/20 129 lb (58.5 kg)  04/17/20 136 lb 6 oz (61.9 kg)  03/09/20 136 lb (61.7 kg)     Exam: General: Pt appears well and is in NAD  Lungs: Clear with good BS bilat with no rales, rhonchi, or wheezes  Heart: RRR with normal S1 and S2 and no gallops; no murmurs; no rub  Abdomen: Normoactive bowel sounds, soft, nontender, without masses or organomegaly palpable  Extremities: No pretibial edema.  Neuro: MS is good with  appropriate affect, pt is alert and Ox3      DM foot exam: 04/17/2020  The skin of the feet is intact without sores or ulcerations. The pedal pulses are undetectable The sensation is intact to a screening 5.07, 10 gram monofilament bilaterally     DATA REVIEWED:  Lab Results  Component Value Date   HGBA1C 11.0 (A) 05/31/2020   HGBA1C 9.1 (H) 06/28/2019   HGBA1C 8.6 (H) 06/20/2019          Results for YATZIRI, WAINWRIGHT (MRN [id number]) as of 06/02/2020 08:49  Ref. Range 05/31/2020 15:22  Sodium Latest Ref Range: 135 - 145 mEq/L 137  Potassium Latest Ref Range: 3.5 - 5.1 mEq/L 4.0  Chloride Latest Ref Range: 96 - 112 mEq/L 102  CO2 Latest Ref Range: 19 - 32 mEq/L 29  Glucose Latest Ref Range: 70 - 99 mg/dL 260 (H)  BUN Latest Ref Range: 6 - 23 mg/dL 12  Creatinine Latest Ref Range: 0.40 - 1.20 mg/dL 0.74  Calcium Latest Ref Range: 8.4 - 10.5 mg/dL 10.0  GFR Latest Ref Range: >60.00 mL/min 84.93    ASSESSMENT / PLAN / RECOMMENDATIONS:   1) Type 2 Diabetes Mellitus, Poorly controlled, With neuropathic and macrovascular complications - Most recent A1c of 11.0 %. Goal A1c < 7.0 %.     - Pt continues with hyperglycemia, she assures me compliance with basal/prandial insulin  - Will adjust the dose as below   -Has Hx of pancreatitis in 2010, so DPP-4 inhibitors and GLp-1 agonists are CONTRAINDICATED   MEDICATIONS: - Increase Tresiba 48 units daily  - Decrease Humalog 8  units with each meal  - CF: Humalog ( BG-130/30)  EDUCATION / INSTRUCTIONS:  BG monitoring instructions: Patient is instructed to check her blood sugars 4 times a day, before meals and bedtime .  Call [place] [facility] clinic if: BG persistently < 70  . I reviewed the Rule of 15 for the treatment of hypoglycemia in detail with the patient. Literature supplied.   2) Diabetic complications:   Eye: Does not have known diabetic retinopathy.   Neuro/ Feet: Does  have known diabetic peripheral  neuropathy .   Renal: Patient does not have known baseline CKD.      F/U in 3 months    Signed electronically by: Mack Guise, MD  [facility]  [facility] [address] [place], [place] [address] Phone: [phone] FAX: [phone]   CC: [name], MD [place] [address] Phone: [phone]  Fax: [phone]  Return to Endocrinology clinic as below: Future Appointments  Date Time Provider [place]  06/19/2020  2:00 PM Clydell Hakim, RD [facility]  08/15/2020  4:00  PM Barbaraann Faster, RN [name] None  08/30/2020  2:00 PM Jalaine Riggenbach, Melanie Crazier, MD [facility] None

## 2020-05-31 NOTE — Patient Instructions (Addendum)
-   Increase Tresiba 48 units daily  - Decrease Humalog 8  units with each meal  -Humalog correctional insulin: ADD extra units on insulin to your meal-time  Humalog  dose if your blood sugars are higher than 160 . Use the scale below to help guide you:   Blood sugar before meal Number of units to inject  Less than 160 0 unit  161 -  190 1 units  191 -  220 2 units  221 -  250 3 units  251 -  280 4 units  281 -  310 5 units  311 -  340 6 units  341 -  370 7 units  371 -  400 8 units     HOW TO TREAT LOW BLOOD SUGARS (Blood sugar LESS THAN 70 MG/DL)  Please follow the RULE OF 15 for the treatment of hypoglycemia treatment (when your (blood sugars are less than 70 mg/dL)    STEP 1: Take 15 grams of carbohydrates when your blood sugar is low, which includes:   3-4 GLUCOSE TABS  OR  3-4 OZ OF JUICE OR REGULAR SODA OR  ONE TUBE OF GLUCOSE GEL     STEP 2: RECHECK blood sugar in 15 MINUTES STEP 3: If your blood sugar is still low at the 15 minute recheck --> then, go back to STEP 1 and treat AGAIN with another 15 grams of carbohydrates.

## 2020-05-31 NOTE — Patient Outreach (Signed)
[facility]) Care Management  05/31/2020  [name] 1955-12-20 [id number]

## 2020-06-01 ENCOUNTER — Telehealth: Payer: Self-pay | Admitting: Internal Medicine

## 2020-06-01 NOTE — Telephone Encounter (Signed)
I would recommend addressing her headaches with PCP . But her A1c is 11 % which means in the past 3 months she has been averaging 269 mg/dL and 200 's reading are not new

## 2020-06-01 NOTE — Telephone Encounter (Signed)
Spoken to patient and notified Dr [name]'s comments. Verbalized understanding.   

## 2020-06-01 NOTE — Telephone Encounter (Signed)
Patient called to advise that since her appointment yesterday she has had a severe headache and blood sugars in the 200's - current CBG is 267  Call back # [phone]

## 2020-06-06 DIAGNOSIS — R0683 Snoring: Secondary | ICD-10-CM | POA: Diagnosis not present

## 2020-06-06 DIAGNOSIS — J309 Allergic rhinitis, unspecified: Secondary | ICD-10-CM | POA: Diagnosis not present

## 2020-06-12 ENCOUNTER — Telehealth: Payer: Self-pay | Admitting: Internal Medicine

## 2020-06-12 LAB — ISLET CELL AB SCREEN RFLX TO TITER: ISLET CELL ANTIBODY SCREEN: NEGATIVE

## 2020-06-12 LAB — GLUTAMIC ACID DECARBOXYLASE AUTO ABS: Glutamic Acid Decarb Ab: <5 IU/mL (ref <5)

## 2020-06-12 NOTE — Telephone Encounter (Signed)
Patient requests to be called at ph# [phone] to be advised on what Patient should do when her blood sugars are high (current blood sugars 182). Patient states her blood sugars go up at night (highs approx. 270-380.

## 2020-06-12 NOTE — Telephone Encounter (Signed)
Spoken to patient and notified Dr Quin Hoop comments. Patient insisted that she is following the sliding scale. It was stating her sugar is 300 right now while taking to me and she did not anything since lunch time.

## 2020-06-12 NOTE — Telephone Encounter (Signed)
Based on the scale if her sugar is 300 , then she needs to take 5 units of Novolog ( assuming she hasn;t taken any in the past 4 hours) . After she give it to herself, she needs to wait 4 hours before any checks and before eating

## 2020-06-12 NOTE — Telephone Encounter (Signed)
She needs to use the correction scale that was provided to her on AVS

## 2020-06-13 DIAGNOSIS — E1165 Type 2 diabetes mellitus with hyperglycemia: Secondary | ICD-10-CM | POA: Diagnosis not present

## 2020-06-13 NOTE — Telephone Encounter (Signed)
Spoken to patient and notified Dr [name]'s comments. Verbalized understanding.   

## 2020-06-19 ENCOUNTER — Ambulatory Visit: Payer: Medicare Other | Admitting: Dietician

## 2020-07-05 DIAGNOSIS — L02214 Cutaneous abscess of groin: Secondary | ICD-10-CM | POA: Diagnosis not present

## 2020-07-17 ENCOUNTER — Encounter: Payer: Medicare Other | Attending: Internal Medicine | Admitting: Dietician

## 2020-07-17 ENCOUNTER — Other Ambulatory Visit: Payer: Self-pay

## 2020-07-17 DIAGNOSIS — Z794 Long term (current) use of insulin: Secondary | ICD-10-CM | POA: Insufficient documentation

## 2020-07-17 DIAGNOSIS — E1159 Type 2 diabetes mellitus with other circulatory complications: Secondary | ICD-10-CM

## 2020-07-17 DIAGNOSIS — E1165 Type 2 diabetes mellitus with hyperglycemia: Secondary | ICD-10-CM | POA: Insufficient documentation

## 2020-07-17 NOTE — Patient Instructions (Addendum)
Stay active.  Increase your walking to most every day for 30 minutes.  Consider avoiding snacks and have regularly scheduled meals. Consider Nestle Pronourish or other supplement when you are unable to eat. Read labels for total carbohydrate and portion size. Stress control

## 2020-07-17 NOTE — Progress Notes (Signed)
Diabetes Self-Management Education  Visit Type: First/Initial  Appt. Start Time: 1535 Appt. End Time: 1640  07/17/2020  [name], identified by name and date of birth, is a 65 y.o. female with a diagnosis of Diabetes: Type 2.   ASSESSMENT Patient is here today alone.   She would like to know more about how to control her blood sugar. Tries to eat enough vegetables, protein, fiber, and drink enough water. She is currently fasting from snacking for lent  History includes Type 2 diabetes (2003, insulin from 2005), CAD, CVA, PAD, HTN, hx of pancreatitis, h/o ovarian cancer (1985), vitamin B-12 deficiency, vitamin D deficiency  Medications inlcude:  Tresiba 48 units q HS, Humalog 8 units plus sliding prior to each meal.  She is to give 1 unit Humalog for every 30 points above 160. Labs:  A1C 11% 05/31/2020 increased from 9.1% 06/28/2019 She uses a dexcom CGM  Time in range was 20% 05/18/20-07/01/20. She used to have an Omnipod insulin pump Sleep:  Problems falling asleep (sees a therapist and a coach).    Weight: 138 lbs 07/17/2020 129 lbs 05/31/2020 114 lbs 1 year ago after surgery  Patient lives alone.  She is on disability secondary to CVA's. She uses an air fryer.  Increased carbs at times.  Skips breakfast frequently as she is not hungry or feeling well.  Tries to take a correction dose of Humalog.   She does not take insulin for snacks that can be high in carbs at times.  Height 5' 3.5" (1.613 m), weight 139 lb (63 kg). Body mass index is 24.24 kg/m.   Diabetes Self-Management Education - 07/17/20 1600      Visit Information   Visit Type First/Initial      Initial Visit   Diabetes Type Type 2    Are you currently following a meal plan? No    Are you taking your medications as prescribed? Yes    Date Diagnosed 2003      Health Coping   How would you rate your overall health? Good      Psychosocial Assessment   Patient Belief/Attitude about Diabetes Motivated to  manage diabetes    Self-management support Doctor's office    Other persons present Patient    Patient Concerns Nutrition/Meal planning    Special Needs None    Preferred Learning Style No preference indicated    Learning Readiness Ready    How often do you need to have someone help you when you read instructions, pamphlets, or other written materials from your doctor or pharmacy? 1 - Never    What is the last grade level you completed in school? some college      Pre-Education Assessment   Patient understands the diabetes disease and treatment process. Needs Review    Patient understands incorporating nutritional management into lifestyle. Needs Review    Patient undertands incorporating physical activity into lifestyle. Needs Review    Patient understands using medications safely. Needs Review    Patient understands monitoring blood glucose, interpreting and using results Needs Review    Patient understands prevention, detection, and treatment of acute complications. Needs Review    Patient understands prevention, detection, and treatment of chronic complications. Needs Review    Patient understands how to develop strategies to address psychosocial issues. Needs Review    Patient understands how to develop strategies to promote health/change behavior. Needs Review      Complications   Last HgB A1C per patient/outside source 11 %  05/31/2020   How often do you check your blood sugar? 3-4 times/day    Fasting Blood glucose range (mg/dL) >200    Postprandial Blood glucose range (mg/dL) 130-179    Number of hypoglycemic episodes per month 1    Can you tell when your blood sugar is low? Yes    What do you do if your blood sugar is low? ate everything in sight    Number of hyperglycemic episodes per week 14    Can you tell when your blood sugar is high? Yes    Have you had a dilated eye exam in the past 12 months? Yes    Have you had a dental exam in the past 12 months? Yes    Are you  checking your feet? Yes    How many days per week are you checking your feet? 7      Dietary Intake   Breakfast honey nut cheerios (2 cups), whole milk, coffee with cream 1 tsp, 2 T sugar or bacon, eggs, waffle with diet syrup    Lunch occasional [place] sandwich or RB or PB& J sandwich    [place] broil, squash and onions, potatoes, 1 roll, sweet potato casserole    Snack (evening) devil dog cupcake, ice cream or PB& J sandwich    Beverage(s) water, occasional sprite zero, occasional coke zero, coffee, whole milk      Exercise   Exercise Type Light (walking / raking leaves)    How many days per week to you exercise? 2    How many minutes per day do you exercise? 30    Total minutes per week of exercise 60      Patient Education   Previous Diabetes Education Yes (please comment)   years ago   Nutrition management  Food label reading, portion sizes and measuring food.;Carbohydrate counting;Role of diet in the treatment of diabetes and the relationship between the three main macronutrients and blood glucose level;Meal options for control of blood glucose level and chronic complications.    Physical activity and exercise  Role of exercise on diabetes management, blood pressure control and cardiac health.    Medications Reviewed patients medication for diabetes, action, purpose, timing of dose and side effects.    Acute complications Taught treatment of hypoglycemia - the 15 rule.      Individualized Goals (developed by patient)   Nutrition Follow meal plan discussed    Physical Activity Exercise 5-7 days per week;30 minutes per day    Medications take my medication as prescribed    Monitoring  test my blood glucose as discussed    Reducing Risk examine blood glucose patterns;treat hypoglycemia with 15 grams of carbs if blood glucose less than 70mg /dL      Post-Education Assessment   Patient understands the diabetes disease and treatment process. Demonstrates understanding / competency     Patient understands incorporating nutritional management into lifestyle. Needs Review    Patient undertands incorporating physical activity into lifestyle. Needs Review    Patient understands using medications safely. Needs Review    Patient understands monitoring blood glucose, interpreting and using results Needs Review    Patient understands prevention, detection, and treatment of acute complications. Demonstrates understanding / competency    Patient understands prevention, detection, and treatment of chronic complications. Demonstrates understanding / competency    Patient understands how to develop strategies to address psychosocial issues. Demonstrates understanding / competency    Patient understands how to develop strategies to promote health/change  behavior. Needs Review      Outcomes   Expected Outcomes Demonstrated interest in learning. Expect positive outcomes    Future DMSE 4-6 wks    Program Status Not Completed           Individualized Plan for Diabetes Self-Management Training:   Learning Objective:  Patient will have a greater understanding of diabetes self-management. Patient education plan is to attend individual and/or group sessions per assessed needs and concerns.   Plan:   Patient Instructions  Stay active.  Increase your walking to most every day for 30 minutes.  Consider avoiding snacks and have regularly scheduled meals. Consider Nestle Pronourish or other supplement when you are unable to eat. Read labels for total carbohydrate and portion size. Stress control   Expected Outcomes:  Demonstrated interest in learning. Expect positive outcomes  Education material provided: Food label handouts, Meal plan card and Snack sheet  If problems or questions, patient to contact team via:  Phone  Future DSME appointment: 4-6 wks

## 2020-07-27 ENCOUNTER — Telehealth: Payer: Self-pay | Admitting: Neurology

## 2020-07-27 ENCOUNTER — Institutional Professional Consult (permissible substitution): Payer: Medicare Other | Admitting: Neurology

## 2020-07-27 NOTE — Telephone Encounter (Signed)
Pt's sleep consult needed r/s due to MD being out of office. Please call pt back to r/s.

## 2020-08-01 ENCOUNTER — Ambulatory Visit (INDEPENDENT_AMBULATORY_CARE_PROVIDER_SITE_OTHER): Payer: Medicare Other | Admitting: Internal Medicine

## 2020-08-01 ENCOUNTER — Telehealth: Payer: Self-pay | Admitting: Dietician

## 2020-08-01 ENCOUNTER — Encounter: Payer: Self-pay | Admitting: Internal Medicine

## 2020-08-01 ENCOUNTER — Other Ambulatory Visit: Payer: Self-pay | Admitting: Internal Medicine

## 2020-08-01 ENCOUNTER — Other Ambulatory Visit: Payer: Self-pay

## 2020-08-01 VITALS — BP 154/88 | HR 108 | Ht 63.0 in | Wt 137.5 lb

## 2020-08-01 DIAGNOSIS — E1142 Type 2 diabetes mellitus with diabetic polyneuropathy: Secondary | ICD-10-CM | POA: Diagnosis not present

## 2020-08-01 DIAGNOSIS — Z794 Long term (current) use of insulin: Secondary | ICD-10-CM | POA: Diagnosis not present

## 2020-08-01 LAB — POCT GLYCOSYLATED HEMOGLOBIN (HGB A1C): Hemoglobin A1C: 11.3 % — AB (ref 4.0–5.6)

## 2020-08-01 MED ORDER — EMPAGLIFLOZIN 25 MG PO TABS: 25.0000 mg | ORAL_TABLET | Freq: Every day | ORAL | 6 refills | Status: DC

## 2020-08-01 MED ORDER — TRESIBA FLEXTOUCH 200 UNIT/ML ~~LOC~~ SOPN: 55.0000 [IU] | PEN_INJECTOR | Freq: Every day | SUBCUTANEOUS | 6 refills | Status: DC

## 2020-08-01 NOTE — Patient Instructions (Addendum)
-   Increase Tresiba 55 units daily  - Continue  Humalog 8 units with each meal  -Humalog correctional insulin: ADD extra units on insulin to your meal-time  Humalog  dose if your blood sugars are higher than 155 . Use the scale below to help guide you:   Blood sugar before meal Number of units to inject  Less than 155 0 unit  156 -  180 1 units  181 -  205 2 units  206 -  230 3 units  231 -  255 4 units  256 -  280 5 units  281 -  305 6 units  306 -  330 7 units  331 -  355 8 units  356 - 380 9 units   381 - 405 10 units      HOW TO TREAT LOW BLOOD SUGARS (Blood sugar LESS THAN 70 MG/DL)  Please follow the RULE OF 15 for the treatment of hypoglycemia treatment (when your (blood sugars are less than 70 mg/dL)    STEP 1: Take 15 grams of carbohydrates when your blood sugar is low, which includes:   3-4 GLUCOSE TABS  OR  3-4 OZ OF JUICE OR REGULAR SODA OR  ONE TUBE OF GLUCOSE GEL     STEP 2: RECHECK blood sugar in 15 MINUTES STEP 3: If your blood sugar is still low at the 15 minute recheck --> then, go back to STEP 1 and treat AGAIN with another 15 grams of carbohydrates.

## 2020-08-01 NOTE — Telephone Encounter (Signed)
Omnipod dash prescription sent  Thanks

## 2020-08-01 NOTE — Telephone Encounter (Signed)
Called patient as MD wanted her to have an earlier nutrition follow up due to erratic and elevated blood sugar readings.  Patient has an Omnipod 4 (?) but has not used in a while.  She would like to restart this.  The supplies are likely expired.  Asked her to call the Omnipod support number to ask about upgrade to the [place] and what she needs to do to obtain more supplies if she cannot upgrade.  I can train her on the [place] if upgraded or refer to [place], [facility] for pump training on the [place] 4 if she is unable to upgrade.  Moved patient's appointment from 4/25 to 3/31.  We will work on carbohydrate counting further as well as discuss progress and needs for the Omnipod supplies and training.  Antonieta Iba, RD, LDN, [facility]ES

## 2020-08-01 NOTE — Telephone Encounter (Signed)
This request came through e-scribe.

## 2020-08-01 NOTE — Progress Notes (Signed)
Name: [name]  Age/ Sex: 65 y.o., female   MRN/ DOB: [id number], Sep 27, 1955     PCP: [name], MD   Reason for Endocrinology Evaluation: Type 2 Diabetes Mellitus  Initial Endocrine Consultative Visit: 04/17/2020    PATIENT IDENTIFIER: [name] is a 65 y.o. female with a past medical history of T2DM, PAD ,CAD, HTN and Hx of pancreatitis  . The patient has followed with Endocrinology clinic since 04/17/2020 for consultative assistance with management of her diabetes.  DIABETIC HISTORY:  [name] was diagnosed with DM in 2003, She is intolerant to Metformin due to diarrhea and Soliqua due to itching . Her hemoglobin A1c has ranged from 7.1%  in 20, peaking at 9.5% in 2015    Has chronic GI issues - Dr Carlean Purl   Islet Ab's and GAD-65 negative   SUBJECTIVE:   During the last visit (05/31/2020):  A1c 11 %, we adjusted MDI regimen     Today (08/01/2020): [name] is here for diabetes follow up .  She checks her blood sugars multiple times a day through CGM. The patient has not had hypoglycemic episodes since the last clinic visit.  She denies any recent nausea or vomiting     HOME DIABETES REGIMEN:  Tresiba 48 units daily   Humalog 8  units with each meal CCF: Humalog ( BG-130/30)     Statin: yes ACE-I/ARB: yes    CONTINUOUS GLUCOSE MONITORING RECORD INTERPRETATION    Dates of Recording: 3/16-3/29/2022  Sensor description:dexcom  Results statistics:   CGM use % of time 86  Average and SD 248/42  Time in range 5  %  % Time Above 180 47  % Time above 250 48  % Time Below target <1    Glycemic patterns summary: Hyperglycemia noted through the night and most of the day, BG's improve by around 4 pm  Hyperglycemic episodes  Overnight and mornings  Hypoglycemic episodes occurred postprandial  Overnight periods: hyperglycemia       DIABETIC COMPLICATIONS: Microvascular complications:   Neuropathy   Denies: CKD,  retinopathy  Last Eye Exam: Completed   Macrovascular complications:   PVD ( S/P femoral-popliteal bypass), Mild CAD   Denies: CAD, CVA   HISTORY:  Past Medical History:  Past Medical History:  Diagnosis Date  . 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 Allergy   . Anemia    hx  . Angioedema 07/10/2015  . Anxiety   . Arthritis    "neck, left hand" (09/14/2012)  . Asthma   . Cancer ([place]) 1985   ovarian, no treatment except surgery  . Cataract   . Complication of anesthesia    OCCASIONAL TROUBLE TURNING NECK TO RIGHT  . Critical lower limb ischemia ([place])    10/2014 s/p L SFA stenting  . Diverticulosis of colon with hemorrhage April 2013  . GERD (gastroesophageal reflux disease)   . GI bleed   . H/O hiatal hernia   . Heart attack [facility])    2003 mild MI, March 2013 mild MI  . Hidradenitis    groin  . History of blood transfusion 1985 AND 2013  . Hyperlipidemia   . Hypertension   . Irritable bowel syndrome   . Left-sided weakness    since stroke, left eye trouble seeing  . Migraines   . Mild CAD    a. Cath 09/2010: mild luminal irregularities of LAD, 30% prox RCA and 20-30% mRCA, EF 65%.  . Neuropathy   . Obesity   .  Osteoporosis   . PAD (peripheral artery disease) ([place])    a. critical limb ischemia s/p PTA/stenting of L SFA 10/2014. c. occ prior SFA stent by angio 01/2016, for possible PV bypass.  . Pneumonia    baby  . Recurrent upper respiratory infection (URI)   . S/P arterial stent-mid Lt SFA 11/03/14 11/04/2014  . Schatzki's ring   . Sinus problem   . Stomach ulcer 1972   non-bleeding  . Stroke ([place]) 07-2007, 07-2008, 07-2009   total 3 strokes; mild left sided weakness and left eye "jumps".  . Type II diabetes mellitus ([place])   . Vitamin B 12 deficiency 07-15-2013   Past Surgical History:  Past Surgical History:  Procedure Laterality Date  . ABDOMINAL HYSTERECTOMY  1985  . ADENOIDECTOMY    . ANTERIOR CERVICAL DECOMP/DISCECTOMY FUSION  2002  . ANTERIOR CERVICAL  DECOMP/DISCECTOMY FUSION N/A 04/08/2014   Procedure: Cervical Six-Seven ANTERIOR CERVICAL DECOMPRESSION/DISCECTOMY FUSION Plating and Bonegraft  2 LEVELS;  Surgeon: Ashok Pall, MD;  Location: [place] NEURO ORS;  Service: Neurosurgery;  Laterality: N/A;  Cervical Six-Seven ANTERIOR CERVICAL DECOMPRESSION/DISCECTOMY FUSION Plating and Bonegraft  2 LEVELS  . APPENDECTOMY  1985  . AXILLARY HIDRADENITIS EXCISION  1990-2008   bilateral  . BACK SURGERY    . BREAST BIOPSY Right 2007  . BREAST CYST EXCISION Right 2008  . BREAST REDUCTION SURGERY    . CARDIAC CATHETERIZATION  2004   mild disease  . CATARACT EXTRACTION W/PHACO Left 05/16/2015   Procedure: CATARACT EXTRACTION PHACO AND INTRAOCULAR LENS PLACEMENT (IOC);  Surgeon: Rutherford Guys, MD;  Location: [facility];  Service: Ophthalmology;  Laterality: Left;  CDE: 4.24  . CATARACT EXTRACTION W/PHACO Right 05/30/2015   Procedure: CATARACT EXTRACTION RIGHT EYE PHACO AND INTRAOCULAR LENS PLACEMENT ;  Surgeon: Rutherford Guys, MD;  Location: [facility];  Service: Ophthalmology;  Laterality: Right;  CDE:4.08  . CHOLECYSTECTOMY  1990's  . COLONOSCOPY  08/12/2011   Procedure: COLONOSCOPY;  Surgeon: Ladene Artist, MD,FACG;  Location: [facility] ENDOSCOPY;  Service: Endoscopy;  Laterality: N/A;  . COLONOSCOPY  06/19/2006  . COLONOSCOPY WITH PROPOFOL N/A 07/02/2019   Procedure: COLONOSCOPY WITH PROPOFOL;  Surgeon: Rogene Houston, MD;  [facility];  Service: Endoscopy;  Laterality: N/A;  . cyst thigh Right   . ESOPHAGOGASTRODUODENOSCOPY  08/12/2011   Procedure: ESOPHAGOGASTRODUODENOSCOPY (EGD);  Surgeon: Ladene Artist, MD,FACG;  Location: [facility] ENDOSCOPY;  Service: Endoscopy;  Laterality: N/A;  . ESOPHAGOGASTRODUODENOSCOPY  06/04/2005  . ESOPHAGOGASTRODUODENOSCOPY (EGD) WITH PROPOFOL N/A 07/01/2019   Procedure: ESOPHAGOGASTRODUODENOSCOPY (EGD) WITH PROPOFOL;  Surgeon: Rogene Houston, MD;  [facility];  Service: Endoscopy;  Laterality: N/A;  . FEMORAL-POPLITEAL  BYPASS GRAFT Left 06/19/2016   Procedure: Left Leg BYPASS GRAFT FEMORAL-POPLITEAL ARTERY;  Surgeon: Serafina Mitchell, MD;  Location: [place];  Service: Vascular;  Laterality: Left;  . GIVENS CAPSULE STUDY  08/13/2011   Procedure: GIVENS CAPSULE STUDY;  Surgeon: Ladene Artist, MD,FACG;  Location: [facility] ENDOSCOPY;  Service: Endoscopy;  Laterality: N/A;  . HAMMER TOE SURGERY Bilateral ~ 2000  . HEMOSTASIS CLIP PLACEMENT  07/02/2019   Procedure: HEMOSTASIS CLIP PLACEMENT;  Surgeon: Rogene Houston, MD;  [facility];  Service: Endoscopy;;  hepatic flexure  . HIATAL HERNIA REPAIR    . HYDRADENITIS EXCISION  01/2011; 03/2012   'groin and abdomen; 03/2012" (09/14/2012)  . HYDRADENITIS EXCISION  04/01/2012   Procedure: EXCISION HYDRADENITIS GROIN;  Surgeon: Pedro Earls, MD;  Location: [facility];  Service: General;  Laterality: Bilateral;  Excision of Hydradenitis of Perineum  . HYDRADENITIS EXCISION N/A 09/17/2013   Procedure: EXCISION PERINEAL HIDRADENITIS ;  Surgeon: Pedro Earls, MD;  Location: [facility];  Service: General;  Laterality: N/A;  also in the pubis area  . LEFT HEART CATH AND CORONARY ANGIOGRAPHY N/A 12/26/2016   Procedure: LEFT HEART CATH AND CORONARY ANGIOGRAPHY;  Surgeon: [name], Peter M, MD;  Location: [place] CV LAB;  Service: Cardiovascular;  Laterality: N/A;  . MASS EXCISION Right 09/17/2013   Procedure: EXCISION MASS;  Surgeon: Pedro Earls, MD;  Location: [facility];  Service: General;  Laterality: Right;  . NISSEN FUNDOPLICATION  7902  . PERIPHERAL VASCULAR CATHETERIZATION N/A 11/03/2014   Procedure: Lower Extremity Angiography;  Surgeon: Lorretta Harp, MD;  Location: [place] CV LAB;  Service: Cardiovascular;  Laterality: N/A;  . PERIPHERAL VASCULAR CATHETERIZATION N/A 01/29/2016   Procedure: Lower Extremity Angiography;  Surgeon: Lorretta Harp, MD;  Location: [place] CV LAB;  Service: Cardiovascular;  Laterality: N/A;  . POLYPECTOMY  07/02/2019   Procedure:  POLYPECTOMY;  Surgeon: Rogene Houston, MD;  [facility];  Service: Endoscopy;;  . POSTERIOR LUMBAR FUSION  2008 X 2  . REDUCTION MAMMAPLASTY  1996?  . TONSILLECTOMY AND ADENOIDECTOMY  1959 AND 2000  . UVULOPALATOPHARYNGOPLASTY, TONSILLECTOMY AND SEPTOPLASTY  2000's    Social History:  reports that she quit smoking about 5 months ago. Her smoking use included cigarettes. She has a 20.50 pack-year smoking history. She has never used smokeless tobacco. She reports that she does not drink alcohol and does not use drugs. Family History:  Family History  Problem Relation Age of Onset  . Breast cancer Mother   . Heart disease Mother   . Hypertension Mother   . Diabetes Father   . Heart disease Father   . Stroke Father   . Heart disease Sister   . Hypertension Sister   . Hypertension Sister   . Hyperlipidemia Sister   . Diabetes Sister   . Allergic rhinitis Sister   . Emphysema Other        great uncle  . Aneurysm Sister        brain  . Colon cancer Paternal Uncle   . Angioedema Neg Hx   . Asthma Neg Hx   . Eczema Neg Hx   . Immunodeficiency Neg Hx   . Urticaria Neg Hx      HOME MEDICATIONS: Allergies as of 08/01/2020      Reactions   Sulfa Antibiotics Shortness Of Breath, Palpitations   Codeine Other (See Comments)   Recovering Addict does not like to take Narcotics   Fish Allergy Hives, Swelling   Tongue swelling   Iodine Swelling   Metformin And Related Diarrhea   Soliqua [insulin Glargine-lixisenatide] Itching, Other (See Comments)   "tongue swelling"   Adhesive [tape] Rash   Paper tape is ok   Shellfish Allergy Swelling, Rash   Tongue swelling      Medication List       Accurate as of August 01, 2020 10:27 AM. If you have any questions, ask your nurse or doctor.        acetaminophen 325 MG tablet Commonly known as: TYLENOL Take 2 tablets (650 mg total) by mouth every 6 (six) hours as needed for mild pain (or Fever >/= 101).   albuterol 108 (90  Base) MCG/ACT inhaler Commonly known as: ProAir HFA 1-2 inhalations every 4-6 hours as needed for cough or  wheeze. What changed:   how much to take  how to take this  when to take this  reasons to take this  additional instructions   ALPRAZolam 0.5 MG tablet Commonly known as: XANAX Take 0.5 mg by mouth 2 (two) times daily as needed for anxiety.   Azelastine HCl 0.15 % Soln Place 2 sprays into both nostrils 2 (two) times daily. What changed:   when to take this  reasons to take this   BD Pen Needle Nano 2nd Gen 32G X 4 MM Misc Generic drug: Insulin Pen Needle   bisacodyl 5 MG EC tablet Commonly known as: DULCOLAX Take 5 mg by mouth daily as needed for moderate constipation.   citalopram 10 MG tablet Commonly known as: CELEXA Take 10 mg by mouth daily as needed.   citalopram 20 MG tablet Commonly known as: CELEXA Take 20 mg by mouth daily.   clopidogrel 75 MG tablet Commonly known as: PLAVIX Take 1 tablet (75 mg total) by mouth daily.   Dexcom G6 Receiver Devi by Does not apply route.   empagliflozin 25 MG Tabs tablet Commonly known as: Jardiance Take 1 tablet (25 mg total) by mouth daily before breakfast. What changed:   medication strength  how much to take Changed by: Dorita Sciara, MD   EPINEPHrine 0.3 mg/0.3 mL Soaj injection Commonly known as: EPI-PEN Inject 0.3 mLs (0.3 mg total) into the muscle once.   Fluocinolone Acetonide Body 0.01 % Oil Apply topically 2 (two) times daily.   insulin lispro 100 UNIT/ML injection Commonly known as: HUMALOG Inject 5-12 Units into the skin 2 (two) times daily before a meal. Per sliding scale using OMNIPOD   levocetirizine 5 MG tablet Commonly known as: XYZAL Take 5 mg by mouth daily.   losartan-hydrochlorothiazide 50-12.5 MG tablet Commonly known as: Hyzaar Take 1 tablet by mouth daily.   Magnesium 200 MG Tabs Take 1 tablet (200 mg total) by mouth daily.   mupirocin ointment 2 % Commonly  known as: BACTROBAN Apply 1 application topically 2 (two) times daily. What changed:   when to take this  reasons to take this   nitroGLYCERIN 0.4 MG SL tablet Commonly known as: NITROSTAT PLACE 1 TAB UNDER TONGUE EVERY 5 MINS AS NEEDED FOR CHEST PAIN - MAX 3 DOSES THEN 911   pantoprazole 40 MG tablet Commonly known as: PROTONIX TAKE 1 TABLET BY MOUTH EVERY DAY   polyethylene glycol 17 g packet Commonly known as: MIRALAX / GLYCOLAX Take 17 g by mouth daily as needed for moderate constipation or severe constipation.   potassium chloride 10 MEQ tablet Commonly known as: KLOR-CON Take 10 mEq by mouth daily.   propranolol ER 60 MG 24 hr capsule Commonly known as: INDERAL LA Take 1 capsule (60 mg total) by mouth every evening.   rosuvastatin 40 MG tablet Commonly known as: CRESTOR Take 1 tablet (40 mg total) by mouth every morning.   SUMAtriptan 100 MG tablet Commonly known as: IMITREX Take 100 mg by mouth every 2 (two) hours as needed for migraine.   temazepam 30 MG capsule Commonly known as: RESTORIL Take 30 mg by mouth at bedtime as needed for sleep.   traZODone 100 MG tablet Commonly known as: DESYREL Take 1 tablet (100 mg total) by mouth at bedtime.   Tyler Aas FlexTouch 200 UNIT/ML FlexTouch Pen Generic drug: insulin degludec Inject 40 Units into the skin.   Vitamin D (Ergocalciferol) 1.25 MG (50000 UNIT) Caps capsule Commonly known as: DRISDOL Take  50,000 Units by mouth every 7 (seven) days.        OBJECTIVE:   Vital Signs: BP (!) 154/88   Pulse (!) 108   Ht 5\' 3"  (1.6 m)   Wt 137 lb 8 oz (62.4 kg)   SpO2 98%   BMI 24.36 kg/m   Wt Readings from Last 3 Encounters:  08/01/20 137 lb 8 oz (62.4 kg)  07/17/20 139 lb (63 kg)  05/31/20 129 lb (58.5 kg)     Exam: General: Pt appears well and is in NAD  Lungs: Clear with good BS bilat with no rales, rhonchi, or wheezes  Heart: RRR with normal S1 and S2 and no gallops; no murmurs; no rub  Abdomen:  Normoactive bowel sounds, soft, nontender, without masses or organomegaly palpable  Extremities: No pretibial edema.  Neuro: MS is good with appropriate affect, pt is alert and Ox3      DM foot exam: 04/17/2020  The skin of the feet is intact without sores or ulcerations. The pedal pulses are undetectable The sensation is intact to a screening 5.07, 10 gram monofilament bilaterally     DATA REVIEWED:  Lab Results  Component Value Date   HGBA1C 11.0 (A) 05/31/2020   HGBA1C 9.1 (H) 06/28/2019   HGBA1C 8.6 (H) 06/20/2019         Results for Jennifer, [name] (MRN [id number]) as of 06/02/2020 08:49  Ref. Range 05/31/2020 15:22  Sodium Latest Ref Range: 135 - 145 mEq/L 137  Potassium Latest Ref Range: 3.5 - 5.1 mEq/L 4.0  Chloride Latest Ref Range: 96 - 112 mEq/L 102  CO2 Latest Ref Range: 19 - 32 mEq/L 29  Glucose Latest Ref Range: 70 - 99 mg/dL 260 (H)  BUN Latest Ref Range: 6 - 23 mg/dL 12  Creatinine Latest Ref Range: 0.40 - 1.20 mg/dL 0.74  Calcium Latest Ref Range: 8.4 - 10.5 mg/dL 10.0  GFR Latest Ref Range: >60.00 mL/min 84.93   Glutamic Acid Decarb Ab <5 IU/mL <5     ISLET CELL ANTIBODY SCREEN Negative  ASSESSMENT / PLAN / RECOMMENDATIONS:   1) Type 2 Diabetes Mellitus, Poorly controlled, With neuropathic and macrovascular complications - Most recent A1c of 11.3 %. Goal A1c < 7.0 %.     - Pt continues with hyperglycemia, she assures me compliance with basal/prandial insulin, but in review of some of her recent eating habits , the pt drank juice juice yesterday with breakfast with BG 's up in the 300's. This Am she drank coffee with cream and sugar and her glucose increased by 150 point. Last night she had peanut butter and crackers - Will adjust basal insulin as below, will change correction scale as well   -Has Hx of pancreatitis in 2010, so DPP-4 inhibitors and GLp-1 agonists are  CONTRAINDICATED - Will refer to our RD for Omnipod training  - She was on  jardiance in 2020 but this was stopped for unknown reasons, she still has some left and will restart on them  - Intolerant to Metformin  - GAD-65 and Islet cell Ab's negative   MEDICATIONS: - Increase Tresiba 55 units daily  - Continue  Humalog 8  units with each meal  - CF: Humalog ( BG-130/25) - restart Jardiance 25 mg daily   EDUCATION / INSTRUCTIONS:  BG monitoring instructions: Patient is instructed to check her blood sugars 4 times a day, before meals and bedtime .  Call [place] [facility] clinic if: BG persistently < 70  . I  reviewed the Rule of 15 for the treatment of hypoglycemia in detail with the patient. Literature supplied.   2) Diabetic complications:   Eye: Does not have known diabetic retinopathy.   Neuro/ Feet: Does  have known diabetic peripheral neuropathy .   Renal: Patient does not have known baseline CKD.      F/U in 3 months    Signed electronically by: Mack Guise, MD  [facility]  [facility] [address] [place], [place] [address] Phone: [phone] FAX: [phone]   CC: [name], MD [place] [address] Phone: [phone]  Fax: [phone]  Return to Endocrinology clinic as below: Future Appointments  Date Time Provider [place]  08/15/2020  4:00 PM Alexia, Dinger, RN [name] None  08/28/2020  1:00 PM Dohmeier, Asencion Partridge, MD [facility] None  08/28/2020  2:00 PM Clydell Hakim, RD [place] [facility]  08/30/2020  2:00 PM Delayla Hoffmaster, Melanie Crazier, MD [facility] None  08/30/2020  3:00 PM Lorretta Harp, MD [facility]

## 2020-08-01 NOTE — Telephone Encounter (Signed)
Patient called once home and stated that she does have the Omnipod DASH insulin pump.  Her pods are all expired and used last sometime in 2021 as she got tired of the bulky pods but now has problems sleeping, vision problems and headaches from poor glucose control and would like to resume use of the pump.  Request for PODs prescritption and insulin pump settings sent to Dr. Kelton Pillar. Patient to come for carb counting education 3/31 and will fit into my schedule for pump training.  Antonieta Iba, RD, LDN, CDCES

## 2020-08-02 ENCOUNTER — Other Ambulatory Visit: Payer: Self-pay | Admitting: Internal Medicine

## 2020-08-02 MED ORDER — INSULIN LISPRO 100 UNIT/ML ~~LOC~~ SOLN: SUBCUTANEOUS | 3 refills | Status: DC

## 2020-08-03 ENCOUNTER — Other Ambulatory Visit: Payer: Self-pay

## 2020-08-03 ENCOUNTER — Encounter: Payer: Self-pay | Admitting: Dietician

## 2020-08-03 ENCOUNTER — Encounter: Payer: Medicare Other | Admitting: Dietician

## 2020-08-03 DIAGNOSIS — E1165 Type 2 diabetes mellitus with hyperglycemia: Secondary | ICD-10-CM

## 2020-08-03 DIAGNOSIS — Z794 Long term (current) use of insulin: Secondary | ICD-10-CM | POA: Diagnosis not present

## 2020-08-03 DIAGNOSIS — E1159 Type 2 diabetes mellitus with other circulatory complications: Secondary | ICD-10-CM | POA: Diagnosis not present

## 2020-08-03 NOTE — Progress Notes (Signed)
Diabetes Self-Management Education  Visit Type: Follow-up  Appt. Start Time: 1100 Appt. End Time: 1200  08/03/2020  [name], identified by name and date of birth, is a 65 y.o. female with a diagnosis of Diabetes:  .   ASSESSMENT Patient is here today alone for a review on carbohydrate counting. Patient downloaded the [place] app on her android phone. Enlarged meal plan card was provided to patient. Patient was able to accurately demonstrate carb counting with sample meals. She will need continued practice to remember. She is unable to do higher level math.  Glucose spiked from 222 to 337 after taking 3 units of insulin and eating a protein bar and coffee with 2 tsp sugar.  Glucose per sensor was on its way down to 290 at the end of her appointment.  She is awaiting a PDM charger and PODS.  We have tentatively scheduled pump training for 08/08/20 at 2 pm.   History includes Type 2 diabetes (2003, insulin from 2005), CAD, CVA, PAD, HTN, hx of pancreatitis, h/o ovarian cancer (1985), vitamin B-12 deficiency, vitamin D deficiency  Medications inlcude:  Tresiba 48 units q HS, Humalog 8 units plus sliding prior to each meal.  She is to give 1 unit Humalog for every 30 points above 160. Labs:  A1C 11% 05/31/2020 increased from 9.1% 06/28/2019 She uses a dexcom CGM  Time in range was 20% 05/18/20-07/01/20. She used to have an Omnipod insulin pump Sleep:  Problems falling asleep (sees a therapist and a coach).    Weight: 138 lbs 07/17/2020 129 lbs 05/31/2020 114 lbs 1 year ago after surgery  Patient lives alone.  She is on disability secondary to CVA's. She uses an air fryer.  Increased carbs at times.  Skips breakfast frequently as she is not hungry or feeling well.  Tries to take a correction dose of Humalog.   She does not take insulin for snacks that can be high in carbs at times.    Diabetes Self-Management Education - 08/03/20 1736      Visit Information   Visit  Type Follow-up      Complications   How often do you check your blood sugar? > 4 times/day    Fasting Blood glucose range (mg/dL) >200    Postprandial Blood glucose range (mg/dL) >200      Dietary Intake   Breakfast protien bar, coffee with 2tsp sugar, 1 tsp creamer OR honey nut cheerios (2 cups), whole milk OR bacon, egg, waffle with diet syrup    Snack (morning) NABs at times    Lunch skips most often    Dinner chicken, vegetables    Snack (evening) NABS at times    Beverage(s) water, sprite zero, coke zero, coffee, whole milk      Patient Education   Nutrition management  Carbohydrate counting      Post-Education Assessment   Patient understands the diabetes disease and treatment process. Demonstrates understanding / competency    Patient understands incorporating nutritional management into lifestyle. Needs Review    Patient undertands incorporating physical activity into lifestyle. Demonstrates understanding / competency    Patient understands using medications safely. Demonstrates understanding / competency    Patient understands monitoring blood glucose, interpreting and using results Demonstrates understanding / competency    Patient understands prevention, detection, and treatment of acute complications. Demonstrates understanding / competency    Patient understands prevention, detection, and treatment of chronic complications. Demonstrates understanding / competency    Patient understands how  to develop strategies to address psychosocial issues. Demonstrates understanding / competency    Patient understands how to develop strategies to promote health/change behavior. Needs Review      Outcomes   Expected Outcomes Demonstrated interest in learning. Expect positive outcomes    Program Status Completed           Individualized Plan for Diabetes Self-Management Training:   Learning Objective:  Patient will have a greater understanding of diabetes self-management. Patient  education plan is to attend individual and/or group sessions per assessed needs and concerns.   Plan:   Patient Instructions  Practice carbohydrate counting.  Pump training:  April 5 at 2pm.  Bring:  Charged PDM (call to get a Games developer)  Charger  Humalog  New PODS    Expected Outcomes:  Demonstrated interest in learning. Expect positive outcomes  Education material provided: enlarged meal plan card  If problems or questions, patient to contact team via:  Phone  Future DSME appointment:

## 2020-08-03 NOTE — Patient Instructions (Signed)
Practice carbohydrate counting.  Pump training:  April 5 at 2pm.  Bring:  Charged PDM (call to get a Games developer)  Charger  Humalog  New PODS

## 2020-08-08 ENCOUNTER — Other Ambulatory Visit: Payer: Self-pay

## 2020-08-08 ENCOUNTER — Encounter: Payer: Medicare Other | Attending: Internal Medicine | Admitting: Dietician

## 2020-08-08 ENCOUNTER — Encounter: Payer: Self-pay | Admitting: Dietician

## 2020-08-08 DIAGNOSIS — Z794 Long term (current) use of insulin: Secondary | ICD-10-CM | POA: Insufficient documentation

## 2020-08-08 DIAGNOSIS — E1159 Type 2 diabetes mellitus with other circulatory complications: Secondary | ICD-10-CM

## 2020-08-08 NOTE — Progress Notes (Signed)
Start time:  1430  End time:  1540  Patient is here today alone for training on the Omnipod DASH Insulin Pump. This was a restart as patient was on the pump in 2021.  She reported tiring of the PODS but now wants improved blood glucose control. Since last appointment she has been reading labels and practicing carbohydrate counting.  She demonstrates understanding of this during this education session. Yesterday, she reports not feeling well.  She took her Tyler Aas last on April 4th (HS) as she was concerned about taking it last night due to her blood glucose readings which were 90-160 yesterday.  She also did not take Humalog 5/6 as she had increased nausea and blood glucose were decreasing per Dexcom.  It was confirmed that the patient was aware of the following: . Blood glucose (BG) testing . Treating hypoglycemia . Hyperglycemia . Carbohydrate counting   (Patient will be using carbohydrate counting with use of this pump.)  Patient was aware of the following: Have the following supplies available to be prepared: . Omnipod PDM and pods . Insulin and syringe . Blood glucose meter, strips, lancets, lancing device . Glucose tablets/fast acting source of carbohydrate/Glucagon  Supply Reorder . [place] [phone] . Reorder - info/contact number . When to reorder (when last box of pods is opened)  System Overview . Communication process/distance . Storage guidelines . Diagnostic tests (CT scans/MRI/Xrays) . Travel guidelines  Pod: . Fill port/adhesive/needle cap/pink slide insert/Waterproof  PDM . Battery/button layout/wireless updates Occupational psychologist updates)  PDM Settings . Pump Therapy Order Form with settings below . Basic settings - Personalized lock screen/time/time zone/date/date format . Basal settings o Max basal 3 o Basal rates 1.5 o Temp basal   . Bolus settings o Target Blood glucose  120 o Insulin to carb (IC) ratio  1:14 o Correction factor  1  unit of insulin for every 25 for BG above 120 o Minimum blood glucose for bolus calculations  120 o Reverse correction o Duration of insulin action  4 hours o Extended bolus o Max bolus Patient was able to accurately enter pump settings into PDM.  Pod Activation . Change Pod  o Room temperature insulin o Fill syringe - min/max amounts o DO NOT prefill Pod o Site selection/rotation & prep o Automated cannula insertion - check infusion site/viewing window & pink slide insert o Blood glucose reminder 1.5 hours after insertion o When to change POD Patient was able to place pod and view insert.  Home Screen . Status Bar, Menu Icon, Notification/Alarms . Tabs o Dashboard - IOB (if Calculator ON) - Instructed to leave calculator on o Basal (Temp Basal if Temp Basal running) o Pod Info - View Pod detail . Last Bolus, Last BG . Bolus button  Menu icon . PDM function - Temp Basal, Pod, Enter BG, Suspend . Manage Programs & Presets - Basal programs, temp basal presets, bolus presets . Food Library ([employer] app, other tools for carbohydrate counting) . History - Notifications & Alarms, Insulin & BG History . Settings - PDM Device, Pod sites, Reminders, Blood Glucose, Basal & Temp Basal, Bolus . About  Advanced Features (to be discussed at a further date based on patient needs) . Extended bolus . Temp basal rate . Additional basal programs . Presets - Temp basal/Bolus presets  Troubleshooting . Hypoglycemia . Sick day management resource . Hyperglycemia & Ketones  Notifications & Alarms . Custom reminders . Pod Expiration alert . Low Reservoir alert  Advisory & Hazard Alarms . Advisory alarms - intermittent tones - response required . Hazard alarm - Continuous vibration, and Tone - urgent attention required - Pod Expired, Empty Reservoir, Occlusion, Pod Error, Auto Off, PDM Error, System Error  Ongoing Success . [place] invitation provided . Omnipod app(s) . Register  for [employer]  . [employer] 24/7 Customer Care  [phone] . Reviewed User Guide . Showed patient Customer's Bill of Rights and Responsibilities and instructed them to review.   Handouts Provided: . Meal plan card . Hypoglycemia   Patient to contact me via MyChart or phone.

## 2020-08-09 ENCOUNTER — Telehealth: Payer: Self-pay | Admitting: Internal Medicine

## 2020-08-09 ENCOUNTER — Telehealth: Payer: Self-pay | Admitting: Nutrition

## 2020-08-09 NOTE — Telephone Encounter (Signed)
Patient called back wanting to give another call back number to reach her at [phone]

## 2020-08-09 NOTE — Telephone Encounter (Signed)
Message left on my machine to call her.  Mickel Baas started her on her OmniPod yesterday, and she reports that blood sugars are dropping.  I tried to call her back but left a message to call back the office with blood sugar readings.

## 2020-08-09 NOTE — Telephone Encounter (Signed)
Patient called to advise that her blood sugars are up and down since starting pump.   Call back # [phone] - ok to leave message

## 2020-08-09 NOTE — Telephone Encounter (Signed)
Message left for patient to return my call.  

## 2020-08-10 ENCOUNTER — Telehealth: Payer: Self-pay | Admitting: Internal Medicine

## 2020-08-10 NOTE — Telephone Encounter (Signed)
New message   Patient is asking for a callback from [place] or Mickel Baas regarding next steps

## 2020-08-10 NOTE — Telephone Encounter (Signed)
Complete. Thanks.

## 2020-08-10 NOTE — Telephone Encounter (Signed)
Please advise her to reduce her basal rate to 1.35 u/hr    Thanks

## 2020-08-10 NOTE — Telephone Encounter (Signed)
Pump setting changes received from MD to change patient's basal rate from 1.5 to 1.35 units per hour.  Called patient and she was able to change this basal setting with prompting.  She continues to be anxious about sensor glucose readings in the low normal range as she continues to symptomatic.  Glucose today was 88 and ranged from approximately 75-223 over the past 12 hours.  She did give a correction bolus for the high sensor reading.    She continues to have a poor appetite, nausea, and occasional headache.  Her diet is higher in fat at times.  Discussed that she may benefit from a low fat diet as this would be easier to digest.   She continues to read carbohydrate content of foods but becomes confused at times.    Will continue to follow. Patient to call for questions.  Antonieta Iba, RD, LDN, CDCES

## 2020-08-10 NOTE — Telephone Encounter (Signed)
Patient notified me that she have spoken to [address].

## 2020-08-11 NOTE — Telephone Encounter (Signed)
Noted. Spoken to patient on Thursday afternoon and she have spoken to [place]

## 2020-08-12 DIAGNOSIS — E1165 Type 2 diabetes mellitus with hyperglycemia: Secondary | ICD-10-CM | POA: Diagnosis not present

## 2020-08-12 DIAGNOSIS — Z794 Long term (current) use of insulin: Secondary | ICD-10-CM | POA: Diagnosis not present

## 2020-08-14 ENCOUNTER — Telehealth: Payer: Self-pay | Admitting: Dietician

## 2020-08-14 NOTE — Telephone Encounter (Signed)
Brief follow up note Called patient to follow up on blood glucose 1 week post restart on the Omnipod Insulin pump.  She is trying to count carbohydrates but is not always entering these.  States that she is feeling better.  Sensor glucose was 88 now.  Discussed that it is normal for her blood glucose to vary from moment to moment.  Will follow up in the office 08/22/20.  Patient is to call for any questions or concerns.  Antonieta Iba, RD, LDN, CDCES

## 2020-08-15 ENCOUNTER — Other Ambulatory Visit: Payer: Self-pay

## 2020-08-15 ENCOUNTER — Other Ambulatory Visit: Payer: Self-pay | Admitting: *Deleted

## 2020-08-15 ENCOUNTER — Encounter: Payer: Self-pay | Admitting: *Deleted

## 2020-08-15 NOTE — Patient Outreach (Signed)
[name] [name]) Care Management  08/15/2020  [name] Sep 04, 1955 [id number]  [name] successful outreach  [name] was referred to [name] on 08/02/19 [facility] lia[facility], [name] Referral Reason:Post [name] follow up Diagnoses of Diabetes,CKD Please refer to community telephonic RN for care management services. Insurance:[employer] medicare- confirms [name] 2022 coverage continues  Medicaid - Now dual complete   Last admission 07/28/19 to 07/30/19   Follow up assessment [name] is reports she is doing much better    Diabetes "is up and down" Has nutritionist with 1-3 day outreaches monitoring her diet per her continuous glucose monitor Previous had a continuous glucose monitor but agree to start wearing again Has gastroparesis that continues to be monitored and managed with medicines and diet  Reading more food label, putting in the total carbohydrates into her continuous monitor [name], [name] follows her every 1-3 days  Pt to follow up on 08/22/20 with [place] Endocrinologist [name] whom she is seeing steadily now  08/01/20 HgA1c 11.3,  05/31/20 11.0,  06/27/19 9.1   Omnipod Insulin pump being used  She confirms she is Diabetes type 2 not 1 She is trying to balance her intake and GI symptoms No constipation but some nausea Need time to process everything she is doing with working with pcp, endocrinologist, counselor, life coach and dietitian  She is doing therapy with counselor [name] and in 3 weeks will be about ready to "graduate" Has Therapy on Fridays and life coach [name]  on continues to interact with her on Wednesdays as she continues to work on diabetes management She reports she is now "less obsessed with my daughter and grand daughter" She reports she is embracing becoming a great grand mother soon  She is reporting consist boundaries being set with her interactions with family and friends Interacting with  female friend [name] in [place], sister, cousins and church members daily Has made a choice not to return to in house church services related to her medical conditions and covid precautions   Still loves to cook and is doing more at home to assist with Diabetes management CBG low at 50 on a few days ago as she was having a busy day and not eating well  She is more aware of this and has a plan to correct this with healthy snacks On 08/23/20 she will follow up with staff member [name] at [name] (pcp) office Allowed pt time to ventilate her feelings She is aware that she needs to get CBG controlled before getting new glasses with assistance of Jennifer [name]    Eating more healthier Pt has new continuous glucose monitor, is seeing endocrinologist and followed in details by dietitian [name] progression review and pt reports having lots of interaction with medical providers to manage now at home She reports being much better than in 2021 Agrees to case closure but with option of outreaching as needed  Plans case closure per discussion with pt as she is meeting goals but continues to have increase amount of medical/social staff working with her on diabetes management Letters to pt and pcp  Pt encouraged to return a call to [facility] RN CM prn Goals Addressed              This Visit's Progress     Patient Stated   .  COMPLETED: ([name]) Eat Healthy (pt-stated)   On track     Case closure 08/15/20   - drink 6 to 8 glasses of  water each day - fill half of plate with vegetables    Notes:  08/15/20 Eating more healthier Pt has new continuous glucose monitor, is seeing endocrinologist and followed in details by dietitian [name] progression review and pt reports having lots of interaction with medical providers to manage now at home She reports being much better than in 2021 Agrees to case closure but with option of outreaching as needed 05/17/20 Her GI symptoms remain but are manageable Colon polyps found in October-November  2021 were benign, removed to follow up in 3 years  She is attempting to make better food choices    .  COMPLETED: [name]) Manage My Diet (pt-stated)   On track     Case closure 08/15/20 - avoid foods that make my symptoms worse - drink at least 6 glasses of fluids a day - take time to eat     Notes:   08/15/20 Pt has new continuous glucose monitor, is seeing endocrinologist and followed in details by dietitian [name] progression review and pt reports having lots of interaction with medical providers to manage now at home She reports being much better than in 2021 Agrees to case closure but with option of outreaching as needed 05/17/20 Her GI symptoms remain but are manageable Colon polyps found in October-November 2021 were benign, removed to follow up in 3 years  She is attempting to make better food choices 04/18/20 present constipation, pt to administer previous discussed gastroenterology "cocktail"  of medications per her action plan Failed CT abdomen to be rescheduled with pt preference in morning and next available at any [facility]     .  COMPLETED: ([name]) Monitor and Manage My Blood Sugar (pt-stated)   On track     Case closure 08/15/20 - check blood sugar at prescribed times - check blood sugar if I feel it is too high or too low - take the blood sugar meter to all doctor visits     Notes: 08/15/20 Pt has new continuous glucose monitor, is seeing endocrinologist and followed in details by dietitian [name] progression review and pt reports having lots of interaction with medical providers to manage now at home She reports being much better than in 2021 Agrees to case closure but with option of outreaching as needed 05/17/20 Seeing endocrinologist [name] who will be assisting her to be followed by a nutritionist 04/17/20 BMI 24.16 last HgA1c 7.1 on 04/17/20 was 8.3  And 11.5 in November 2021 12/21 reports CBG values elevated in 300's  03/16/20 report cbgs in  [id number]'U Discussed Helicobacter pylori and sent education information to review        Karolee Meloni L. Lavina Hamman, RN, BSN, [place] Coordinator Office number [phone] [facility] [name] number [phone] Fax number [phone]

## 2020-08-17 ENCOUNTER — Encounter: Payer: Self-pay | Admitting: *Deleted

## 2020-08-22 ENCOUNTER — Telehealth: Payer: Self-pay | Admitting: Dietician

## 2020-08-22 ENCOUNTER — Other Ambulatory Visit: Payer: Self-pay

## 2020-08-22 ENCOUNTER — Encounter: Payer: Medicare Other | Admitting: Dietician

## 2020-08-22 DIAGNOSIS — Z7689 Persons encountering health services in other specified circumstances: Secondary | ICD-10-CM | POA: Diagnosis not present

## 2020-08-22 DIAGNOSIS — E782 Mixed hyperlipidemia: Secondary | ICD-10-CM | POA: Diagnosis not present

## 2020-08-22 DIAGNOSIS — N179 Acute kidney failure, unspecified: Secondary | ICD-10-CM | POA: Diagnosis not present

## 2020-08-22 DIAGNOSIS — E1140 Type 2 diabetes mellitus with diabetic neuropathy, unspecified: Secondary | ICD-10-CM | POA: Diagnosis not present

## 2020-08-22 DIAGNOSIS — N1832 Chronic kidney disease, stage 3b: Secondary | ICD-10-CM | POA: Diagnosis not present

## 2020-08-22 DIAGNOSIS — I10 Essential (primary) hypertension: Secondary | ICD-10-CM | POA: Diagnosis not present

## 2020-08-22 DIAGNOSIS — E1149 Type 2 diabetes mellitus with other diabetic neurological complication: Secondary | ICD-10-CM | POA: Diagnosis not present

## 2020-08-22 NOTE — Telephone Encounter (Signed)
Called patient for follow up re:  New pump. Patient states that she is doing better and feeling more comfortable.  Dexcom readings indicate good control.  Patient to call as needed for questions or concerns. Next appointment 08/22/2020.  Antonieta Iba, RD, LDN, CDCES

## 2020-08-22 NOTE — Telephone Encounter (Signed)
Called and spoke to patient post Omnipod Dash insulin pump training 08/08/2020.  She stated that her blood sugar was decreasing and she was not hungry and therefore was not eating well.  BG generally between 70-100 but she is not used to tight control.   Instructed patient to not bolus for food at this time.  Encouraged regularly scheduled meals, avoid foods with MD messaged.  Antonieta Iba, RD, LDN, CDCES

## 2020-08-22 NOTE — Telephone Encounter (Signed)
Called patient and instructed her to change her pump settings as follows:  basal rate to 1.35 per MD recommendations.  Instructed her to increase her I:C to 16.  She was able to complete with prompting.  Patient continues to voice concerns about fluctuating blood sugar.   Discussed that consistency of meals are important.  Continue to follow.  Antonieta Iba, RD, LDN, CDCES

## 2020-08-22 NOTE — Progress Notes (Signed)
Patient is here today alone for follow up re:  Omnipod Dash, carbohydrate counting.  Current Pump Settings include: Basal rate 1.35 I:C = 16 Correction Factor 25  Dexcom reading currenty 228 2 hours after 2 waffles with sugar free syrup, bacon, 4 oz juice and decreased to 180 during this appointment. She verbalized entering 28 carbs.  We went through each food item.  Carb content of meal 33 carbohydrates.  Patient is able to verbalize how to treat a low and is carrying items to treat a low with her.   She is cooking more and trying to find healthy recipes on [employer]. She expresses confidence and is sharing her improved control with family members. She remains concerned about going low at night and is more hesitant to do corrections at dinner.  Encouraged her to enter glucose. She is not entering carbs with snacks (sometimes less than the I:C).  I encouraged her to enter carbs for all items consumed.   She will consume regular soda at times.  Discussed need to enter this in her pump as well as better options. Discussed that due to her slow digestion, she would likely tolerate low fat meals best.  She states that she has been using the air fryer more for this reason. She is tolerating meals better currently but at times cannot finish a meal. Discussed to watch her CGM closely when she enters more carbs than she is able to consume.  She is to see MD next week for pump adjustments. She is to follow up with myself and would like to see myself and MD on the same day.  She will call for an appointment when MD appointment date known. She is to call me to help her with any pump setting adjustments.

## 2020-08-22 NOTE — Patient Instructions (Addendum)
Be consistent with entering your blood sugar and carbs prior to each meal. Enter the carbs for all snacks Recommend beverages without sugar.  If you do drink a regular soda, be sure to enter the total carbs into your pump.  Continue to bake or air fry rather than fry most often. Low fat foods are easier for you to digest.

## 2020-08-23 DIAGNOSIS — E1140 Type 2 diabetes mellitus with diabetic neuropathy, unspecified: Secondary | ICD-10-CM | POA: Diagnosis not present

## 2020-08-23 DIAGNOSIS — E782 Mixed hyperlipidemia: Secondary | ICD-10-CM | POA: Diagnosis not present

## 2020-08-23 DIAGNOSIS — E559 Vitamin D deficiency, unspecified: Secondary | ICD-10-CM | POA: Diagnosis not present

## 2020-08-23 DIAGNOSIS — G4700 Insomnia, unspecified: Secondary | ICD-10-CM | POA: Diagnosis not present

## 2020-08-23 DIAGNOSIS — G9009 Other idiopathic peripheral autonomic neuropathy: Secondary | ICD-10-CM | POA: Diagnosis not present

## 2020-08-23 DIAGNOSIS — E1165 Type 2 diabetes mellitus with hyperglycemia: Secondary | ICD-10-CM | POA: Diagnosis not present

## 2020-08-23 DIAGNOSIS — G43019 Migraine without aura, intractable, without status migrainosus: Secondary | ICD-10-CM | POA: Diagnosis not present

## 2020-08-23 DIAGNOSIS — I739 Peripheral vascular disease, unspecified: Secondary | ICD-10-CM | POA: Diagnosis not present

## 2020-08-23 DIAGNOSIS — K219 Gastro-esophageal reflux disease without esophagitis: Secondary | ICD-10-CM | POA: Diagnosis not present

## 2020-08-23 DIAGNOSIS — I10 Essential (primary) hypertension: Secondary | ICD-10-CM | POA: Diagnosis not present

## 2020-08-23 DIAGNOSIS — M160 Bilateral primary osteoarthritis of hip: Secondary | ICD-10-CM | POA: Diagnosis not present

## 2020-08-24 ENCOUNTER — Other Ambulatory Visit (HOSPITAL_COMMUNITY): Payer: Self-pay | Admitting: Internal Medicine

## 2020-08-24 DIAGNOSIS — G43909 Migraine, unspecified, not intractable, without status migrainosus: Secondary | ICD-10-CM | POA: Diagnosis not present

## 2020-08-24 DIAGNOSIS — N644 Mastodynia: Secondary | ICD-10-CM

## 2020-08-28 ENCOUNTER — Other Ambulatory Visit: Payer: Self-pay

## 2020-08-28 ENCOUNTER — Encounter: Payer: Self-pay | Admitting: Neurology

## 2020-08-28 ENCOUNTER — Ambulatory Visit (INDEPENDENT_AMBULATORY_CARE_PROVIDER_SITE_OTHER): Payer: Medicare Other | Admitting: Neurology

## 2020-08-28 ENCOUNTER — Ambulatory Visit: Payer: Medicare Other | Admitting: Dietician

## 2020-08-28 VITALS — BP 139/78 | HR 74 | Ht 63.0 in | Wt 139.5 lb

## 2020-08-28 DIAGNOSIS — R0683 Snoring: Secondary | ICD-10-CM | POA: Diagnosis not present

## 2020-08-28 DIAGNOSIS — F5104 Psychophysiologic insomnia: Secondary | ICD-10-CM | POA: Diagnosis not present

## 2020-08-28 DIAGNOSIS — D510 Vitamin B12 deficiency anemia due to intrinsic factor deficiency: Secondary | ICD-10-CM

## 2020-08-28 DIAGNOSIS — Z72821 Inadequate sleep hygiene: Secondary | ICD-10-CM | POA: Diagnosis not present

## 2020-08-28 DIAGNOSIS — R059 Cough, unspecified: Secondary | ICD-10-CM | POA: Insufficient documentation

## 2020-08-28 DIAGNOSIS — G4721 Circadian rhythm sleep disorder, delayed sleep phase type: Secondary | ICD-10-CM | POA: Diagnosis not present

## 2020-08-28 DIAGNOSIS — G4734 Idiopathic sleep related nonobstructive alveolar hypoventilation: Secondary | ICD-10-CM | POA: Diagnosis not present

## 2020-08-28 DIAGNOSIS — E1143 Type 2 diabetes mellitus with diabetic autonomic (poly)neuropathy: Secondary | ICD-10-CM | POA: Diagnosis not present

## 2020-08-28 DIAGNOSIS — G4733 Obstructive sleep apnea (adult) (pediatric): Secondary | ICD-10-CM | POA: Diagnosis not present

## 2020-08-28 DIAGNOSIS — G4719 Other hypersomnia: Secondary | ICD-10-CM

## 2020-08-28 DIAGNOSIS — I739 Peripheral vascular disease, unspecified: Secondary | ICD-10-CM

## 2020-08-28 NOTE — Telephone Encounter (Signed)
Patient called asking if [name] could give her a call back regarding her pump. She asked if we could try her home phone first [phone], and if she does not answer to please call her cell phone at [phone].

## 2020-08-28 NOTE — Progress Notes (Signed)
SLEEP MEDICINE CLINIC    Provider:  Larey Seat, MD  Primary Care Physician:  Jennifer Squibb, MD [place] [address]     Referring Provider: [name], Md [place],  [place] [id number]          Chief Complaint according to patient   Patient presents with:    . New Patient (Initial Visit)           HISTORY OF PRESENT ILLNESS:  [name] is a 65  year old African American female patient is seen hereupon referral on 08/28/2020 from [name]. Chief concern according to patient :  I had my tonsils, adenoid removed, I was snoring , I had UPPP, and I am still snoring",I had EMG and NCV and sleep tests with Jennifer. Merlene [name], and received no results , no follow up." Chronic insomnia.    She was just started on trazodone 50 mg tablets and has tried up to 200 mg at night but states that it has kept her awake instead of helping her to sleep.  She reports a paradoxical reaction.  She is currently also on Amitiza, clopidogrel gel also known as Plavix, Zofran for nausea related to gastroparesis, propranolol 60 mg, loperamide for diarrhea hydrochlorothiazide-losartan-propranolol-aspirin-rosuvastatin-sumatriptan-alprazolam-Reglan.    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), Allergy, Anemia, Angioedema (07/10/2015), Anxiety, Arthritis, Asthma, Cancer ([place]) (1985), Cataract, Complication of anesthesia, Critical lower limb ischemia ([place]), Diverticulosis of colon with hemorrhage (April 2013), GERD (gastroesophageal reflux disease), GI bleed, H/O hiatal hernia, Heart attack ([place]), Hidradenitis, History of blood transfusion (1985 AND 2013), Hyperlipidemia, Hypertension, Irritable bowel syndrome, Left-sided weakness, Migraines, Mild CAD, Neuropathy, Obesity, Osteoporosis, PAD (peripheral artery disease) ([place]), Pneumonia, Recurrent upper respiratory infection (URI), S/P arterial stent-mid Lt SFA 11/03/14 (11/04/2014), Schatzki's  ring, Sinus problem, Stomach ulcer (1972), Stroke ([place]) (07-2007, 07-2008, 07-2009), Type II diabetes mellitus ([place]), and Vitamin B 12 deficiency (07-15-2013)..  She has PVD/ PAD and had a fem pop bypass in the left. Awaiting hip replacement- affecting  Her sleep ,too. She passed out 06-2019, hit her head, was hypokalemic, and hyperglycemia, developed internal bleeding. [name].    The patient had the first sleep study in the year 2015 - she can not recall been given results.    Sleep relevant medical history: Nocturia 2-3, Insulin pump,  Tonsillectomy yes at age 9 , and UPPP in adulthood., bronchitis, Pneumonia.  No deviated septum repair, but UPPP?    Family medical /sleep history: father  had OSA, sister not tested but loud snorer.    Social history: Patient is  retired from Statistician- worked for [employer], came from [place],  and lives in a household alone, not pets.. Family status is divorced  with one daughter ,one granddaughter   The patient currently is disabled. Tobacco use ,cut down only 6 month ago-.  ETOH use ; none ,  Caffeine intake in form of Coffee( 2 in AM ) Soda( at dinner) Tea ( none ) or energy drinks. Regular exercise: walking.   Sleep habits are as follows:she uses an insulin pump, gastroparesis, The patient's dinner time is between 7 PM. The patient goes to bed at 11 PM but is awake until 2 AM- watches TV -poor sleep hygiene  and continues to sleep for only 2-5  hours, wakes for 2-3 bathroom breaks, the first time at 3 AM.   The preferred sleep position  is sideways with the support of 1-2 pillows, with a raised bed-. Dreams are reportedly frequent/vivid .  10 AM is the usual rise time. The patient wakes up spontaneously.  She reports not feeling refreshed or restored in AM, with symptoms such as dry mouth, morning headaches- dull, and migraines, always  residual fatigue.  Naps are taken infrequently, lasting from 1-2 h and are more refreshing than nocturnal sleep.    Review  of Systems: Out of a complete 14 system review, the patient complains of only the following symptoms, and all other reviewed systems are negative.:  Fatigue, sleepiness , snoring, fragmented sleep, Insomnia    How likely are you to doze in the following situations: 0 = not likely, 1 = slight chance, 2 = moderate chance, 3 = high chance   Sitting and Reading? Watching Television? Sitting inactive in a public place (theater or meeting)? As a passenger in a car for an hour without a break? Lying down in the afternoon when circumstances permit? Sitting and talking to someone? Sitting quietly after lunch without alcohol? In a car, while stopped for a few minutes in traffic?   Total = 10/ 24 points   FSS endorsed at 45/ 63 points.  1/15 depression score.   Social History   Socioeconomic History  . Marital status: Divorced    Spouse name: Not on file  . Number of children: 1  . Years of education: some college  . Highest education level: Some college, no degree  Occupational History  . Occupation: Sport and exercise psychologist: SELF EMPLOYED    Comment: disabled, notary  Tobacco Use  . Smoking status: Former Smoker    Packs/day: 0.50    Years: 41.00    Pack years: 20.50    Types: Cigarettes    Quit date: 03/01/2020    Years since quitting: 0.4  . Smokeless tobacco: Never Used  . Tobacco comment: Getting ready to start nicotine patches RX by Jennifer. Gwenlyn [name] per pt.  Vaping Use  . Vaping Use: Never used  Substance and Sexual Activity  . Alcohol use: No    Alcohol/week: 0.0 standard drinks  . Drug use: No    Types: "Crack" cocaine    Comment: 05/09/2015.  "quit 07/27/1994"  . Sexual activity: Not Currently    Birth control/protection: Abstinence  Other Topics Concern  . 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  Divorced, living alone in [place].     Social Determinants of Health   Financial Resource Strain: Low Risk   . Difficulty of Paying Living Expenses: Not very hard  Food Insecurity: No Food Insecurity  . Worried About Charity fundraiser in the Last Year: Never true  . Ran Out of Food in the Last Year: Never true  Transportation Needs: No Transportation Needs  . Lack of Transportation (Medical): No  . Lack of Transportation (Non-Medical): No  Physical Activity: Not on file  Stress: No Stress Concern Present  . Feeling of Stress : Only a little  Social Connections: Moderately Integrated  . Frequency of Communication with Friends and Family: More than three times a week  . Frequency of Social Gatherings with Friends and Family: More than three times a week  . Attends Religious Services: More than 4  times per year  . Active Member of Clubs or Organizations: Yes  . Attends Archivist Meetings: More than 4 times per year  . Marital Status: Divorced    Family History  Problem Relation Age of Onset  . Breast cancer Mother   . Heart disease Mother   . Hypertension Mother   . Diabetes Father   . Heart disease Father   . Stroke Father   . Heart disease Sister   . Hypertension Sister   . Hypertension Sister   . Hyperlipidemia Sister   . Diabetes Sister   . Allergic rhinitis Sister   . Emphysema Other        great uncle  . Aneurysm Sister        brain  . Colon cancer Paternal Uncle   . Angioedema Neg Hx   . Asthma Neg Hx   . Eczema Neg Hx   . Immunodeficiency Neg Hx   . Urticaria Neg Hx     Past Medical History:  Diagnosis Date  . 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 Allergy   . Anemia    hx  . Angioedema 07/10/2015  . Anxiety   . Arthritis    "neck, left hand" (09/14/2012)  . Asthma   . Cancer ([place]) 1985   ovarian, no treatment except surgery  . Cataract   . Complication of anesthesia    OCCASIONAL TROUBLE TURNING NECK TO RIGHT  . Critical  lower limb ischemia ([place])    10/2014 s/p L SFA stenting  . Diverticulosis of colon with hemorrhage April 2013  . GERD (gastroesophageal reflux disease)   . GI bleed   . H/O hiatal hernia   . Heart attack [facility])    2003 mild MI, March 2013 mild MI  . Hidradenitis    groin  . History of blood transfusion 1985 AND 2013  . Hyperlipidemia   . Hypertension   . Irritable bowel syndrome   . Left-sided weakness    since stroke, left eye trouble seeing  . Migraines   . Mild CAD    a. Cath 09/2010: mild luminal irregularities of LAD, 30% prox RCA and 20-30% mRCA, EF 65%.  . Neuropathy   . Obesity   . Osteoporosis   . PAD (peripheral artery disease) ([place])    a. critical limb ischemia s/p PTA/stenting of L SFA 10/2014. c. occ prior SFA stent by angio 01/2016, for possible PV bypass.  . Pneumonia    baby  . Recurrent upper respiratory infection (URI)   . S/P arterial stent-mid Lt SFA 11/03/14 11/04/2014  . Schatzki's ring   . Sinus problem   . Stomach ulcer 1972   non-bleeding  . Stroke ([place]) 07-2007, 07-2008, 07-2009   total 3 strokes; mild left sided weakness and left eye "jumps".  . Type II diabetes mellitus ([place])   . Vitamin B 12 deficiency 07-15-2013    Past Surgical History:  Procedure Laterality Date  . ABDOMINAL HYSTERECTOMY  1985  . ADENOIDECTOMY    . ANTERIOR CERVICAL DECOMP/DISCECTOMY FUSION  2002  . ANTERIOR CERVICAL DECOMP/DISCECTOMY FUSION N/A 04/08/2014   Procedure: Cervical Six-Seven ANTERIOR CERVICAL DECOMPRESSION/DISCECTOMY FUSION Plating and Bonegraft  2 LEVELS;  Surgeon: Ashok Pall, MD;  Location: [facility]URO ORS;  Service: Neurosurgery;  Laterality: N/A;  Cervical Six-Seven ANTERIOR CERVICAL DECOMPRESSION/DISCECTOMY FUSION Plating and Bonegraft  2 LEVELS  . APPENDECTOMY  1985  . AXILLARY HIDRADENITIS EXCISION  1990-2008   bilateral  . BACK SURGERY    . BREAST BIOPSY  Right 2007  . BREAST CYST EXCISION Right 2008  . BREAST REDUCTION SURGERY    . CARDIAC CATHETERIZATION   2004   mild disease  . CATARACT EXTRACTION W/PHACO Left 05/16/2015   Procedure: CATARACT EXTRACTION PHACO AND INTRAOCULAR LENS PLACEMENT (IOC);  Surgeon: Rutherford Guys, MD;  Location: [facility];  Service: Ophthalmology;  Laterality: Left;  CDE: 4.24  . CATARACT EXTRACTION W/PHACO Right 05/30/2015   Procedure: CATARACT EXTRACTION RIGHT EYE PHACO AND INTRAOCULAR LENS PLACEMENT ;  Surgeon: Rutherford Guys, MD;  Location: [facility];  Service: Ophthalmology;  Laterality: Right;  CDE:4.08  . CHOLECYSTECTOMY  1990's  . COLONOSCOPY  08/12/2011   Procedure: COLONOSCOPY;  Surgeon: Ladene Artist, MD,FACG;  Location: [facility] ENDOSCOPY;  Service: Endoscopy;  Laterality: N/A;  . COLONOSCOPY  06/19/2006  . COLONOSCOPY WITH PROPOFOL N/A 07/02/2019   Procedure: COLONOSCOPY WITH PROPOFOL;  Surgeon: Rogene Houston, MD;  [facility];  Service: Endoscopy;  Laterality: N/A;  . cyst thigh Right   . ESOPHAGOGASTRODUODENOSCOPY  08/12/2011   Procedure: ESOPHAGOGASTRODUODENOSCOPY (EGD);  Surgeon: Ladene Artist, MD,FACG;  Location[facility] END[facility];  Service: Endoscopy;  Laterality: N/A;  . ESOPHAGOGASTRODUODENOSCOPY  06/04/2005  . ESOPHAGOGASTRODUODENOSCOPY (EGD) WITH PROPOFOL N/A 07/01/2019   Procedure: ESOPHAGOGASTRODUODENOSCOPY (EGD) WITH PROPOFOL;  Surgeon: Rogene Houston, MD;  [facility];  Service: Endoscopy;  Laterality: N/A;  . FEMORAL-POPLITEAL BYPASS GRAFT Left 06/19/2016   Procedure: Left Leg BYPASS GRAFT FEMORAL-POPLITEAL ARTERY;  Surgeon: Serafina Mitchell, MD;  Location: [facility];  Service: Vascular;  Laterality: Left;  . GIVENS CAPSULE STUDY  08/13/2011   Procedure: GIVENS CAPSULE STUDY;  Surgeon: Ladene Artist, MD,FACG;  Location: [facility] ENDOSCOPY;  Service: Endoscopy;  Laterality: N/A;  . HAMMER TOE SURGERY Bilateral ~ 2000  . HEMOSTASIS CLIP PLACEMENT  07/02/2019   Procedure: HEMOSTASIS CLIP PLACEMENT;  Surgeon: Rogene Houston, MD;  [facility];  Service: Endoscopy;;  hepatic flexure  . HIATAL  HERNIA REPAIR    . HYDRADENITIS EXCISION  01/2011; 03/2012   'groin and abdomen; 03/2012" (09/14/2012)  . HYDRADENITIS EXCISION  04/01/2012   Procedure: EXCISION HYDRADENITIS GROIN;  Surgeon: Pedro Earls, MD;  Location: [facility];  Service: General;  Laterality: Bilateral;  Excision of Hydradenitis of Perineum  . HYDRADENITIS EXCISION N/A 09/17/2013   Procedure: EXCISION PERINEAL HIDRADENITIS ;  Surgeon: Pedro Earls, MD;  Location: [facility];  Service: General;  Laterality: N/A;  also in the pubis area  . LEFT HEART CATH AND CORONARY ANGIOGRAPHY N/A 12/26/2016   Procedure: LEFT HEART CATH AND CORONARY ANGIOGRAPHY;  Surgeon: [name], Peter M, MD;  Location: [facility] CV LAB;  Service: Cardiovascular;  Laterality: N/A;  . MASS EXCISION Right 09/17/2013   Procedure: EXCISION MASS;  Surgeon: Pedro Earls, MD;  Location: [facility];  Service: General;  Laterality: Right;  . NISSEN FUNDOPLICATION  4193  . PERIPHERAL VASCULAR CATHETERIZATION N/A 11/03/2014   Procedure: Lower Extremity Angiography;  Surgeon: Lorretta Harp, MD;  Location: [facility] CV LAB;  Service: Cardiovascular;  Laterality: N/A;  . PERIPHERAL VASCULAR CATHETERIZATION N/A 01/29/2016   Procedure: Lower Extremity Angiography;  Surgeon: Lorretta Harp, MD;  Location: [facility] CV LAB;  Service: Cardiovascular;  Laterality: N/A;  . POLYPECTOMY  07/02/2019   Procedure: POLYPECTOMY;  Surgeon: Rogene Houston, MD;  [facility];  Service: Endoscopy;;  . POSTERIOR LUMBAR FUSION  2008 X 2  . REDUCTION MAMMAPLASTY  1996?  . TONSILLECTOMY AND ADENOIDECTOMY  [place]  . UVULOPALATOPHARYNGOPLASTY, TONSILLECTOMY AND SEPTOPLASTY  2000's     Current Outpatient Medications on File Prior to Visit  Medication Sig Dispense Refill  . acetaminophen (TYLENOL) 325 MG tablet Take 2 tablets (650 mg total) by mouth every 6 (six) hours as needed for mild pain (or Fever >/= 101). 12 tablet 2  . albuterol (PROAIR HFA) 108 (90 Base)  MCG/ACT inhaler 1-2 inhalations every 4-6 hours as needed for cough or wheeze. (Patient taking differently: Inhale 1-2 puffs into the lungs every 4 (four) hours as needed for shortness of breath.) 1 Inhaler 1  . ALPRAZolam (XANAX) 0.5 MG tablet Take 0.5 mg by mouth 2 (two) times daily as needed for anxiety.    . Azelastine HCl 0.15 % SOLN Place 2 sprays into both nostrils 2 (two) times daily. (Patient taking differently: Place 2 sprays into both nostrils 2 (two) times daily as needed (allergies).) 30 mL 5  . bisacodyl (DULCOLAX) 5 MG EC tablet Take 5 mg by mouth daily as needed for moderate constipation.    . citalopram (CELEXA) 10 MG tablet Take 10 mg by mouth daily as needed.     . citalopram (CELEXA) 20 MG tablet Take 20 mg by mouth daily.    . clopidogrel (PLAVIX) 75 MG tablet Take 1 tablet (75 mg total) by mouth daily. 30 tablet 2  . Continuous Blood Gluc Receiver ([employer]) DEVI by Does not apply route.    . D3-50 1.25 MG (50000 UT) capsule Take 50,000 Units by mouth every 7 (seven) days.    . empagliflozin (JARDIANCE) 25 MG TABS tablet Take 1 tablet (25 mg total) by mouth daily before breakfast. 30 tablet 6  . EPINEPHrine 0.3 mg/0.3 mL IJ SOAJ injection Inject 0.3 mLs (0.3 mg total) into the muscle once. 1 Device 1  . Fluocinolone Acetonide Body 0.01 % OIL Apply topically 2 (two) times daily.    . fluticasone (FLONASE) 50 MCG/ACT nasal spray SPRAY 2 SPRAYS INTO EACH NOSTRIL EVERY DAY    . Insulin Disposable Pump (OMNIPOD DASH 5 PACK PODS) MISC CHANGE EVERY 72 HOURS 30 each 6  . insulin lispro (HUMALOG) 100 UNIT/ML injection Max daily 100 units per OMNIPOD 90 mL 3  . levocetirizine (XYZAL) 5 MG tablet Take 5 mg by mouth daily.    Marland Kitchen losartan-hydrochlorothiazide (HYZAAR) 50-12.5 MG tablet Take 1 tablet by mouth daily. 90 tablet 3  . Magnesium 200 MG TABS Take 1 tablet (200 mg total) by mouth daily. 30 tablet 6  . mupirocin ointment (BACTROBAN) 2 % Apply 1 application topically 2 (two)  times daily. (Patient taking differently: Apply 1 application topically 2 (two) times daily as needed.) 30 g 1  . nitroGLYCERIN (NITROSTAT) 0.4 MG SL tablet PLACE 1 TAB UNDER TONGUE EVERY 5 MINS AS NEEDED FOR CHEST PAIN - MAX 3 DOSES THEN 911 25 tablet 2  . pantoprazole (PROTONIX) 40 MG tablet TAKE 1 TABLET BY MOUTH EVERY DAY (Patient taking differently: Take 40 mg by mouth daily.) 30 tablet 11  . polyethylene glycol (MIRALAX / GLYCOLAX) 17 g packet Take 17 g by mouth daily as needed for moderate constipation or severe constipation.    . potassium chloride (KLOR-CON) 10 MEQ tablet Take 10 mEq by mouth daily.    . propranolol ER (INDERAL LA) 60 MG 24 hr capsule Take 1 capsule (60 mg total) by mouth every evening. 90 capsule 3  . rosuvastatin (CRESTOR) 40 MG tablet Take 1 tablet (40 mg total) by  mouth every morning. 90 tablet 3  . SUMAtriptan (IMITREX) 100 MG tablet Take 100 mg by mouth every 2 (two) hours as needed for migraine.     . temazepam (RESTORIL) 30 MG capsule Take 30 mg by mouth at bedtime as needed for sleep.    . traZODone (DESYREL) 100 MG tablet Take 1 tablet (100 mg total) by mouth at bedtime. 30 tablet 0  . Vitamin D, Ergocalciferol, (DRISDOL) 1.25 MG (50000 UNIT) CAPS capsule Take 50,000 Units by mouth every 7 (seven) days.     No current facility-administered medications on file prior to visit.    Allergies  Allergen Reactions  . Sulfa Antibiotics Shortness Of Breath and Palpitations  . 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 Fish Allergy Hives and Swelling    Tongue swelling  . Iodine Swelling  . Metformin And Related Diarrhea  . [name] [Insulin Glargine-Lixisenatide] Itching and Other (See Comments)    "tongue swelling"  . Adhesive [Tape] Rash    Paper tape is ok  . Shellfish Allergy Swelling and Rash    Tongue swelling    Physical exam:  Today's Vitals   08/28/20 1256  BP: 139/78  Pulse: 74  SpO2: 97%  Weight: 139  lb 8 oz (63.3 kg)  Height: 5\' 3"  (1.6 m)   Body mass index is 24.71 kg/m.   Wt Readings from Last 3 Encounters:  08/28/20 139 lb 8 oz (63.3 kg)  08/01/20 137 lb 8 oz (62.4 kg)  07/17/20 139 lb (63 kg)     Ht Readings from Last 3 Encounters:  08/28/20 5\' 3"  (1.6 m)  08/01/20 5\' 3"  (1.6 m)  07/17/20 5' 3.5" (1.613 m)      General: The patient is awake, alert and appears not in acute distress.  The patient is well groomed. Head: Normocephalic, atraumatic. Neck is supple. Mallampati 2, small oral opening. Dry mouth,  neck circumference:14.5 inches . Nasal airflow not fully patent.  Prognathia is seen.  Dental status: crowded.  Cardiovascular:  Regular rate and cardiac rhythm by pulse,  without distended neck veins. Respiratory: Lungs are clear to auscultation.  Skin:  Without evidence of ankle edema, or rash. Trunk: The patient's posture is erect.   Neurologic exam : The patient is awake and alert, oriented to place and time.   Memory subjective described as intact.  Attention span & concentration ability appears limited, she is not answering questions promptly, -tangential .   Speech is fluent,  without  dysarthria, but some dysphonia or aphasia.  Mood and affect are defensive .   Cranial nerves: no loss of smell or taste reported  Pupils are equal and briskly reactive to light. Funduscopic exam deferred.  Left lower eyelid twitching.  Extraocular movements in vertical and horizontal planes were intact and without nystagmus. No Diplopia. Visual fields by finger perimetry are intact. Hearing was intact to soft voice and finger rubbing.    Facial sensation intact to fine touch.  Facial motor strength is symmetric and tongue midline.  She has had uvula trimmed, not all soft palate was removed.  Neck ROM : rotation, tilt and flexion extension were normal for age and shoulder shrug was symmetrical.    Motor exam:  Symmetric bulk, tone and ROM.   Normal tone without  cog-wheeling, symmetric grip strength .   Sensory:  Fine touch  and vibration were reduced in the feet below the ankles. .  Proprioception tested  in the upper extremities was normal.   Coordination: Rapid alternating movements in the fingers/hands were of normal speed.  The Finger-to-nose maneuver was intact on the left and dysmetric on the right-  without evidence of  tremor.   Gait and station: Patient could rise unassisted from a seated position. Toe and heel walk were deferred.  Deep tendon reflexes: in the  upper and lower extremities are symmetric and intact.  Babinski response was deferred .       After spending a total time of 50 minutes face to face and for physical and neurologic examination, review of laboratory studies,  personal review of imaging studies, reports and results of other testing and review of referral information / records as far as provided in visit, I have established the following assessments:  Mrs. Harber reports that she has been a longtime snorer she had her tonsils and adenoids removed at age 53, she had a second adenoidectomy at age 14+ and she also underwent a uvula and soft tissue reduction surgery at the time.  She still suffers from frequently congested nasal airway, she has tried Breathe Right strips with with limited success, she is a loud snorer and she feels that this is embarrassing.  She is  sure she  has apnea.  She had been undergoing an MRI study at the study had to be interrupted because she quit breathing.  She also reports chronic insomnia and this is not a new phenomenon but it has been worsening after she had no longer the same time restraints of daily routines, also due to pain, achiness, discomfort.  She is on an albuterol inhaler and at this time still smoking, this also poses a risk that she would not just have apnea but also hypoxemia.  Peripheral vascular disease with femoral-popliteal bypass treatment has affected her left lower  extremity mostly and she does have some neuropathy work-up as well.  She is a restless sleeper, described as tossing and turning, and this may be partially to due to pain.  1)  Apnea witnessed under sedation, life long snorer with small oral airway and restricted nasal passage.   2)  COPD/ tobacco smoke related breathing problems plus allergic rhinitis/ bronchitis. High risk for hypoxia.    3) PVD/ PAD - causing PLMs.  She has a history of claudication.   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    My Plan is to proceed with:  1) attended sleep study in a patient with multiple co-morbidities, mu ost concerned about snoring and Isomnia. Sleep related headaches. Has poor sleep habits/ hygiene   2) plan B will only screen for HST- screening test, but does not address all aspects of her disordered sleep.  3) sleep instructions- boot camp.    I would like to thank  Jennifer Squibb, Md [address] [place],  [place] [id number] for allowing me to meet with and to take care of this pleasant patient.    I plan to follow up either personally or through our NP within 2-4  month.    Electronically signed by: Jennifer Seat, MD 08/28/2020 1:22 PM  [facility] Board certified by The [employer] of Sleep Medicine and Diplomate of the [employer] of Sleep Medicine. Board certified In Neurology through the [place], Fellow of the [employer] of Neurology. Medical Director of [facility].

## 2020-08-28 NOTE — Patient Instructions (Signed)

## 2020-08-29 NOTE — Telephone Encounter (Signed)
Needs oV

## 2020-08-29 NOTE — Telephone Encounter (Signed)
Spoken to patient and schedule patient on 08/31/2020

## 2020-08-29 NOTE — Telephone Encounter (Signed)
Spoken to patient and she is concern that her blood sugar reading have been in the 200s at bedtime, noticed only at bedtime. Patient wanted to know if Dr Kelton Pillar suggested regarding her pump.

## 2020-08-30 ENCOUNTER — Ambulatory Visit: Payer: Medicare Other | Admitting: Internal Medicine

## 2020-08-30 ENCOUNTER — Ambulatory Visit: Payer: Medicare Other | Admitting: Cardiovascular Disease

## 2020-08-31 ENCOUNTER — Other Ambulatory Visit: Payer: Self-pay

## 2020-08-31 ENCOUNTER — Encounter: Payer: Self-pay | Admitting: Internal Medicine

## 2020-08-31 ENCOUNTER — Ambulatory Visit (INDEPENDENT_AMBULATORY_CARE_PROVIDER_SITE_OTHER): Payer: Medicare Other | Admitting: Internal Medicine

## 2020-08-31 VITALS — BP 148/78 | HR 100 | Ht 63.0 in | Wt 138.2 lb

## 2020-08-31 DIAGNOSIS — E1165 Type 2 diabetes mellitus with hyperglycemia: Secondary | ICD-10-CM | POA: Diagnosis not present

## 2020-08-31 DIAGNOSIS — E1142 Type 2 diabetes mellitus with diabetic polyneuropathy: Secondary | ICD-10-CM

## 2020-08-31 DIAGNOSIS — R21 Rash and other nonspecific skin eruption: Secondary | ICD-10-CM | POA: Diagnosis not present

## 2020-08-31 DIAGNOSIS — Z794 Long term (current) use of insulin: Secondary | ICD-10-CM

## 2020-08-31 DIAGNOSIS — E1159 Type 2 diabetes mellitus with other circulatory complications: Secondary | ICD-10-CM

## 2020-08-31 NOTE — Progress Notes (Signed)
Name: [name]  Age/ Sex: 65 y.o., female   MRN/ DOB: [id number], 1955/11/19     PCP: [name], MD   Reason for Endocrinology Evaluation: Type 2 Diabetes Mellitus  Initial Endocrine Consultative Visit: 04/17/2020    PATIENT IDENTIFIER: [name] is a 65 y.o. female with a past medical history of T2DM, PAD ,CAD, HTN and Hx of pancreatitis  . The patient has followed with Endocrinology clinic since 04/17/2020 for consultative assistance with management of her diabetes.  DIABETIC HISTORY:  [name] was diagnosed with DM in 2003, She is intolerant to Metformin due to diarrhea and Soliqua due to itching . Her hemoglobin A1c has ranged from 7.1%  in 20, peaking at 9.5% in 2015  Has chronic GI issues - Dr Carlean Purl   Islet Ab's and GAD-65 negative   Jardiance started 07/2020 Omnipod started 08/2020  SUBJECTIVE:   During the last visit (08/01/2020):  A1c 11.3 %, we adjusted MDI regimen and started Jardiance      Today (08/31/2020): [name] is here for diabetes follow up .  She checks her blood sugars multiple times a day through CGM. The patient has not had hypoglycemic episodes since the last clinic visit.  She denies vomiting but occasional nausea   This patient with type 2 diabetes is treated with Omnipod (insulin pump). During the visit the pump basal and bolus doses were reviewed including carb/insulin rations and supplemental doses. The clinical list was updated. The glucose meter download was reviewed in detail to determine if the current pump settings are providing the best glycemic control without excessive hypoglycemia.  Pump and meter download:    Pump   Omnipod Settings   Insulin type   Humalog    Basal rate       0000  1.35 u/h               I:C ratio       0000 1:16                  Sensitivity       0000  25      Goal       0000  120            Type & Model of Pump: Omnipod Insulin Type: Currently using Humalog  .   PUMP STATISTICS: Average BG: 190  Average Daily Carbs (g): 75  Average Total Daily Insulin: 40.4  Average Daily Basal: 23.5 (73 %) Average Daily Bolus: 8.8 (27 %)    HOME DIABETES REGIMEN:  Humalog Jardiance 25 mg daily     Statin: yes ACE-I/ARB: yes    CONTINUOUS GLUCOSE MONITORING RECORD INTERPRETATION    Dates of Recording: 4/15-4/28/2022 Sensor description:dexcom  Results statistics:   CGM use % of time 93  Average and SD 198/46  Time in range 32 %  % Time Above 180 57  % Time above 250 11  % Time Below target <1    Glycemic patterns summary: Hyperglycemia noted through the night and most of the day Hyperglycemic episodes  Overnight day   Hypoglycemic episodes occurred N/A  Overnight periods: hyperglycemia       DIABETIC COMPLICATIONS: Microvascular complications:   Neuropathy   Denies: CKD, retinopathy  Last Eye Exam: Completed   Macrovascular complications:   PVD ( S/P femoral-popliteal bypass), Mild CAD   Denies: CAD, CVA   HISTORY:  Past Medical History:  Past Medical History:  Diagnosis Date  .  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 Allergy   . Anemia    hx  . Angioedema 07/10/2015  . Anxiety   . Arthritis    "neck, left hand" (09/14/2012)  . Asthma   . Cancer ([place]) 1985   ovarian, no treatment except surgery  . Cataract   . Complication of anesthesia    OCCASIONAL TROUBLE TURNING NECK TO RIGHT  . Critical lower limb ischemia ([place])    10/2014 s/p L SFA stenting  . Diverticulosis of colon with hemorrhage April 2013  . GERD (gastroesophageal reflux disease)   . GI bleed   . H/O hiatal hernia   . Heart attack [facility])    2003 mild MI, March 2013 mild MI  . Hidradenitis    groin  . History of blood transfusion 1985 AND 2013  . Hyperlipidemia   . Hypertension   . Irritable bowel syndrome   . Left-sided weakness    since stroke, left eye trouble seeing  . Migraines   . Mild CAD    a. Cath 09/2010: mild luminal irregularities  of LAD, 30% prox RCA and 20-30% mRCA, EF 65%.  . Neuropathy   . Obesity   . Osteoporosis   . PAD (peripheral artery disease) ([place])    a. critical limb ischemia s/p PTA/stenting of L SFA 10/2014. c. occ prior SFA stent by angio 01/2016, for possible PV bypass.  . Pneumonia    baby  . Recurrent upper respiratory infection (URI)   . S/P arterial stent-mid Lt SFA 11/03/14 11/04/2014  . Schatzki's ring   . Sinus problem   . Stomach ulcer 1972   non-bleeding  . Stroke ([place]) 07-2007, 07-2008, 07-2009   total 3 strokes; mild left sided weakness and left eye "jumps".  . Type II diabetes mellitus ([place])   . Vitamin B 12 deficiency 07-15-2013   Past Surgical History:  Past Surgical History:  Procedure Laterality Date  . ABDOMINAL HYSTERECTOMY  1985  . ADENOIDECTOMY    . ANTERIOR CERVICAL DECOMP/DISCECTOMY FUSION  2002  . ANTERIOR CERVICAL DECOMP/DISCECTOMY FUSION N/A 04/08/2014   Procedure: Cervical Six-Seven ANTERIOR CERVICAL DECOMPRESSION/DISCECTOMY FUSION Plating and Bonegraft  2 LEVELS;  Surgeon: Ashok Pall, MD;  Location: [place] [facility]URO ORS;  Service: Neurosurgery;  Laterality: N/A;  Cervical Six-Seven ANTERIOR CERVICAL DECOMPRESSION/DISCECTOMY FUSION Plating and Bonegraft  2 LEVELS  . APPENDECTOMY  1985  . AXILLARY HIDRADENITIS EXCISION  1990-2008   bilateral  . BACK SURGERY    . BREAST BIOPSY Right 2007  . BREAST CYST EXCISION Right 2008  . BREAST REDUCTION SURGERY    . CARDIAC CATHETERIZATION  2004   mild disease  . CATARACT EXTRACTION W/PHACO Left 05/16/2015   Procedure: CATARACT EXTRACTION PHACO AND INTRAOCULAR LENS PLACEMENT (IOC);  Surgeon: Rutherford Guys, MD;  Location[facility];  Service: Ophthalmology;  Laterality: Left;  CDE: 4.24  . CATARACT EXTRACTION W/PHACO Right 05/30/2015   Procedure: CATARACT EXTRACTION RIGHT EYE PHACO AND INTRAOCULAR LENS PLACEMENT ;  Surgeon: Rutherford Guys, MD;  Location: [facility];  Service: Ophthalmology;  Laterality: Right;  CDE:4.08  . CHOLECYSTECTOMY  1990's  .  COLONOSCOPY  08/12/2011   Procedure: COLONOSCOPY;  Surgeon: Ladene Artist, MD,FACG;  Location: [facility] ENDOSCOPY;  Service: Endoscopy;  Laterality: N/A;  . COLONOSCOPY  06/19/2006  . COLONOSCOPY WITH PROPOFOL N/A 07/02/2019   Procedure: COLONOSCOPY WITH PROPOFOL;  Surgeon: Rogene Houston, MD;  [facility];  Service: Endoscopy;  Laterality: N/A;  . cyst thigh Right   . ESOPHAGOGASTRODUODENOSCOPY  08/12/2011  Procedure: ESOPHAGOGASTRODUODENOSCOPY (EGD);  Surgeon: Ladene Artist, MD,FACG;  Location: [facility] ENDOSCOPY;  Service: Endoscopy;  Laterality: N/A;  . ESOPHAGOGASTRODUODENOSCOPY  06/04/2005  . ESOPHAGOGASTRODUODENOSCOPY (EGD) WITH PROPOFOL N/A 07/01/2019   Procedure: ESOPHAGOGASTRODUODENOSCOPY (EGD) WITH PROPOFOL;  Surgeon: Rogene Houston, MD;  [facility];  Service: Endoscopy;  Laterality: N/A;  . FEMORAL-POPLITEAL BYPASS GRAFT Left 06/19/2016   Procedure: Left Leg BYPASS GRAFT FEMORAL-POPLITEAL ARTERY;  Surgeon: Serafina Mitchell, MD;  Location: [place];  Service: Vascular;  Laterality: Left;  . GIVENS CAPSULE STUDY  08/13/2011   Procedure: GIVENS CAPSULE STUDY;  Surgeon: Ladene Artist, MD,FACG;  [facility]: [facility];  Service: Endoscopy;  Laterality: N/A;  . HAMMER TOE SURGERY Bilateral ~ 2000  . HEMOSTASIS CLIP PLACEMENT  07/02/2019   Procedure: HEMOSTASIS CLIP PLACEMENT;  Surgeon: Rogene Houston, MD;  [facility];  Service: Endoscopy;;  hepatic flexure  . HIATAL HERNIA REPAIR    . HYDRADENITIS EXCISION  01/2011; 03/2012   'groin and abdomen; 03/2012" (09/14/2012)  . HYDRADENITIS EXCISION  04/01/2012   Procedure: EXCISION HYDRADENITIS GROIN;  Surgeon: Pedro Earls, MD;  Location: [facility];  Service: General;  Laterality: Bilateral;  Excision of Hydradenitis of Perineum  . HYDRADENITIS EXCISION N/A 09/17/2013   Procedure: EXCISION PERINEAL HIDRADENITIS ;  Surgeon: Pedro Earls, MD;  Location: [facility];  Service: General;  Laterality: N/A;  also in the pubis  area  . LEFT HEART CATH AND CORONARY ANGIOGRAPHY N/A 12/26/2016   Procedure: LEFT HEART CATH AND CORONARY ANGIOGRAPHY;  Surgeon: [name], Peter M, MD;  Location: [place] CV LAB;  Service: Cardiovascular;  Laterality: N/A;  . MASS EXCISION Right 09/17/2013   Procedure: EXCISION MASS;  Surgeon: Pedro Earls, MD;  Location: [facility];  Service: General;  Laterality: Right;  . NISSEN FUNDOPLICATION  2703  . PERIPHERAL VASCULAR CATHETERIZATION N/A 11/03/2014   Procedure: Lower Extremity Angiography;  Surgeon: Lorretta Harp, MD;  Location: [place] CV LAB;  Service: Cardiovascular;  Laterality: N/A;  . PERIPHERAL VASCULAR CATHETERIZATION N/A 01/29/2016   Procedure: Lower Extremity Angiography;  Surgeon: Lorretta Harp, MD;  Location: [place] CV LAB;  Service: Cardiovascular;  Laterality: N/A;  . POLYPECTOMY  07/02/2019   Procedure: POLYPECTOMY;  Surgeon: Rogene Houston, MD;  [facility];  Service: Endoscopy;;  . POSTERIOR LUMBAR FUSION  2008 X 2  . REDUCTION MAMMAPLASTY  1996?  . TONSILLECTOMY AND ADENOIDECTOMY  1959 AND 2000  . UVULOPALATOPHARYNGOPLASTY, TONSILLECTOMY AND SEPTOPLASTY  2000's    Social History:  reports that she quit smoking about 6 months ago. Her smoking use included cigarettes. She has a 20.50 pack-year smoking history. She has never used smokeless tobacco. She reports that she does not drink alcohol and does not use drugs. Family History:  Family History  Problem Relation Age of Onset  . Breast cancer Mother   . Heart disease Mother   . Hypertension Mother   . Diabetes Father   . Heart disease Father   . Stroke Father   . Heart disease Sister   . Hypertension Sister   . Hypertension Sister   . Hyperlipidemia Sister   . Diabetes Sister   . Allergic rhinitis Sister   . Emphysema Other        great uncle  . Aneurysm Sister        brain  . Colon cancer Paternal Uncle   . Angioedema Neg Hx   . Asthma Neg Hx   .  Eczema Neg Hx   .  Immunodeficiency Neg Hx   . Urticaria Neg Hx      HOME MEDICATIONS: Allergies as of 08/31/2020      Reactions   Sulfa Antibiotics Shortness Of Breath, Palpitations   Codeine Other (See Comments)   Recovering Addict does not like to take Narcotics   Fish Allergy Hives, Swelling   Tongue swelling   Iodine Swelling   Metformin And Related Diarrhea   Soliqua [insulin Glargine-lixisenatide] Itching, Other (See Comments)   "tongue swelling"   Adhesive [tape] Rash   Paper tape is ok   Shellfish Allergy Swelling, Rash   Tongue swelling      Medication List       Accurate as of August 31, 2020  7:18 AM. If you have any questions, ask your nurse or doctor.        acetaminophen 325 MG tablet Commonly known as: TYLENOL Take 2 tablets (650 mg total) by mouth every 6 (six) hours as needed for mild pain (or Fever >/= 101).   albuterol 108 (90 Base) MCG/ACT inhaler Commonly known as: ProAir HFA 1-2 inhalations every 4-6 hours as needed for cough or wheeze. What changed:   how much to take  how to take this  when to take this  reasons to take this  additional instructions   ALPRAZolam 0.5 MG tablet Commonly known as: XANAX Take 0.5 mg by mouth 2 (two) times daily as needed for anxiety.   Azelastine HCl 0.15 % Soln Place 2 sprays into both nostrils 2 (two) times daily. What changed:   when to take this  reasons to take this   bisacodyl 5 MG EC tablet Commonly known as: DULCOLAX Take 5 mg by mouth daily as needed for moderate constipation.   citalopram 10 MG tablet Commonly known as: CELEXA Take 10 mg by mouth daily as needed.   citalopram 20 MG tablet Commonly known as: CELEXA Take 20 mg by mouth daily.   clopidogrel 75 MG tablet Commonly known as: PLAVIX Take 1 tablet (75 mg total) by mouth daily.   D3-50 1.25 MG (50000 UT) capsule Generic drug: Cholecalciferol Take 50,000 Units by mouth every 7 (seven) days.   Dexcom G6 Receiver Devi by Does not apply  route.   empagliflozin 25 MG Tabs tablet Commonly known as: Jardiance Take 1 tablet (25 mg total) by mouth daily before breakfast.   EPINEPHrine 0.3 mg/0.3 mL Soaj injection Commonly known as: EPI-PEN Inject 0.3 mLs (0.3 mg total) into the muscle once.   Fluocinolone Acetonide Body 0.01 % Oil Apply topically 2 (two) times daily.   fluticasone 50 MCG/ACT nasal spray Commonly known as: FLONASE SPRAY 2 SPRAYS INTO EACH NOSTRIL EVERY DAY   insulin lispro 100 UNIT/ML injection Commonly known as: HUMALOG Max daily 100 units per OMNIPOD   levocetirizine 5 MG tablet Commonly known as: XYZAL Take 5 mg by mouth daily.   losartan-hydrochlorothiazide 50-12.5 MG tablet Commonly known as: Hyzaar Take 1 tablet by mouth daily.   Magnesium 200 MG Tabs Take 1 tablet (200 mg total) by mouth daily.   mupirocin ointment 2 % Commonly known as: BACTROBAN Apply 1 application topically 2 (two) times daily. What changed:   when to take this  reasons to take this   nitroGLYCERIN 0.4 MG SL tablet Commonly known as: NITROSTAT PLACE 1 TAB UNDER TONGUE EVERY 5 MINS AS NEEDED FOR CHEST PAIN - MAX 3 DOSES THEN 911   OmniPod Dash 5 Pack Pods Misc CHANGE  EVERY 72 HOURS   pantoprazole 40 MG tablet Commonly known as: PROTONIX TAKE 1 TABLET BY MOUTH EVERY DAY   polyethylene glycol 17 g packet Commonly known as: MIRALAX / GLYCOLAX Take 17 g by mouth daily as needed for moderate constipation or severe constipation.   potassium chloride 10 MEQ tablet Commonly known as: KLOR-CON Take 10 mEq by mouth daily.   propranolol ER 60 MG 24 hr capsule Commonly known as: INDERAL LA Take 1 capsule (60 mg total) by mouth every evening.   rosuvastatin 40 MG tablet Commonly known as: CRESTOR Take 1 tablet (40 mg total) by mouth every morning.   SUMAtriptan 100 MG tablet Commonly known as: IMITREX Take 100 mg by mouth every 2 (two) hours as needed for migraine.   temazepam 30 MG capsule Commonly  known as: RESTORIL Take 30 mg by mouth at bedtime as needed for sleep.   traZODone 100 MG tablet Commonly known as: DESYREL Take 1 tablet (100 mg total) by mouth at bedtime.   Vitamin D (Ergocalciferol) 1.25 MG (50000 UNIT) Caps capsule Commonly known as: DRISDOL Take 50,000 Units by mouth every 7 (seven) days.        OBJECTIVE:   Vital Signs: BP (!) 148/78   Pulse 100   Ht 5\' 3"  (1.6 m)   Wt 138 lb 4 oz (62.7 kg)   SpO2 98%   BMI 24.49 kg/m   Wt Readings from Last 3 Encounters:  08/28/20 139 lb 8 oz (63.3 kg)  08/01/20 137 lb 8 oz (62.4 kg)  07/17/20 139 lb (63 kg)     Exam: General: Pt appears well and is in NAD  Lungs: Clear with good BS bilat with no rales, rhonchi, or wheezes  Heart: RRR with normal S1 and S2 and no gallops; no murmurs; no rub  Abdomen: Normoactive bowel sounds, soft, nontender, without masses or organomegaly palpable  Extremities: No pretibial edema.  Neuro: MS is good with appropriate affect, pt is alert and Ox3      DM foot exam: 04/17/2020  The skin of the feet is intact without sores or ulcerations. The pedal pulses are undetectable The sensation is intact to a screening 5.07, 10 gram monofilament bilaterally     DATA REVIEWED:  Lab Results  Component Value Date   HGBA1C 11.3 (A) 08/01/2020   HGBA1C 11.0 (A) 05/31/2020   HGBA1C 9.1 (H) 06/28/2019         Results for MUNIRAH, MUNNELL (MRN [id number]) as of 06/02/2020 08:49  Ref. Range 05/31/2020 15:22  Sodium Latest Ref Range: 135 - 145 mEq/L 137  Potassium Latest Ref Range: 3.5 - 5.1 mEq/L 4.0  Chloride Latest Ref Range: 96 - 112 mEq/L 102  CO2 Latest Ref Range: 19 - 32 mEq/L 29  Glucose Latest Ref Range: 70 - 99 mg/dL 260 (H)  BUN Latest Ref Range: 6 - 23 mg/dL 12  Creatinine Latest Ref Range: 0.40 - 1.20 mg/dL 0.74  Calcium Latest Ref Range: 8.4 - 10.5 mg/dL 10.0  GFR Latest Ref Range: >60.00 mL/min 84.93   Glutamic Acid Decarb Ab <5 IU/mL <5     ISLET CELL  ANTIBODY SCREEN Negative  ASSESSMENT / PLAN / RECOMMENDATIONS:   1) Type 2 Diabetes Mellitus, Poorly controlled, With neuropathic and macrovascular complications - Most recent A1c of 11.3 %. Goal A1c < 7.0 %.     - Despite hyperglycemia, her average Bg's are improving  - She is tolerating jardiance without issues , I wanted to do  a BMP today but she apparently had labs through her PCP last week, these records are not available at this time  -Has Hx of pancreatitis in 2010, so DPP-4 inhibitors and GLp-1 agonists are  CONTRAINDICATED - GAD-65 and Islet cell Ab's negative  - Will make the following pump changes   MEDICATIONS:  Jardiance 25 mg daily  Humalog   Pump   Omnipod Settings   Insulin type   Humalog    Basal rate       0000 -0400 1.45 u/h    0400-1700 1.35   1700-000 1.45      I:C ratio       0000 1:14                  Sensitivity       0000  25      Goal       0000  120         EDUCATION / INSTRUCTIONS:  BG monitoring instructions: Patient is instructed to check her blood sugars 4 times a day, before meals and bedtime .  Call [facility] if: BG persistently < 70  . I reviewed the Rule of 15 for the treatment of hypoglycemia in detail with the patient. Literature supplied.   2) Diabetic complications:   Eye: Does not have known diabetic retinopathy.   Neuro/ Feet: Does  have known diabetic peripheral neuropathy .   Renal: Patient does not have known baseline CKD.      F/U in 3 months    Signed electronically by: Mack Guise, MD  [facility]  [address] [place], [place] [address] Phone: [phone] FAX: [phone]   CC: [name], MD [place] [address] Phone: [phone]  Fax: [phone]  Return to Endocrinology clinic as below: Future Appointments  Date Time Provider [place]  08/31/2020 10:30 AM Sherisa Gilvin, Melanie Crazier, MD [facility] None  09/19/2020  2:00 PM AP-MM/VASC US 3 AP-US [place] [facility]  09/19/2020  2:00 PM AP-MM DIAG AP-MM [place] [facility]  09/19/2020  2:10 PM AP-MM/VASC US 3 AP-US [place] [facility]  10/04/2020  1:45 PM Lorretta Harp, MD [facility]

## 2020-08-31 NOTE — Patient Instructions (Signed)
-   Continue Jardiance 25 mg daily  - Continue Humalog through the pump    HOW TO TREAT LOW BLOOD SUGARS (Blood sugar LESS THAN 70 MG/DL)  Please follow the RULE OF 15 for the treatment of hypoglycemia treatment (when your (blood sugars are less than 70 mg/dL)    STEP 1: Take 15 grams of carbohydrates when your blood sugar is low, which includes:   3-4 GLUCOSE TABS  OR  3-4 OZ OF JUICE OR REGULAR SODA OR  ONE TUBE OF GLUCOSE GEL     STEP 2: RECHECK blood sugar in 15 MINUTES STEP 3: If your blood sugar is still low at the 15 minute recheck --> then, go back to STEP 1 and treat AGAIN with another 15 grams of carbohydrates.

## 2020-09-11 ENCOUNTER — Ambulatory Visit: Payer: Medicare Other | Admitting: Internal Medicine

## 2020-09-11 DIAGNOSIS — E1165 Type 2 diabetes mellitus with hyperglycemia: Secondary | ICD-10-CM | POA: Diagnosis not present

## 2020-09-11 DIAGNOSIS — Z794 Long term (current) use of insulin: Secondary | ICD-10-CM | POA: Diagnosis not present

## 2020-09-13 ENCOUNTER — Other Ambulatory Visit (HOSPITAL_COMMUNITY): Payer: Self-pay | Admitting: Internal Medicine

## 2020-09-13 DIAGNOSIS — E1149 Type 2 diabetes mellitus with other diabetic neurological complication: Secondary | ICD-10-CM | POA: Diagnosis not present

## 2020-09-13 DIAGNOSIS — N644 Mastodynia: Secondary | ICD-10-CM

## 2020-09-13 DIAGNOSIS — E1165 Type 2 diabetes mellitus with hyperglycemia: Secondary | ICD-10-CM | POA: Diagnosis not present

## 2020-09-19 ENCOUNTER — Ambulatory Visit (HOSPITAL_COMMUNITY)
Admission: RE | Admit: 2020-09-19 | Discharge: 2020-09-19 | Disposition: A | Discharge status: Home / Self Care | Payer: Medicare Other | Source: Ambulatory Visit | Attending: Internal Medicine | Admitting: Internal Medicine

## 2020-09-19 ENCOUNTER — Other Ambulatory Visit: Payer: Self-pay

## 2020-09-19 DIAGNOSIS — N644 Mastodynia: Secondary | ICD-10-CM | POA: Diagnosis not present

## 2020-09-19 DIAGNOSIS — R922 Inconclusive mammogram: Secondary | ICD-10-CM | POA: Diagnosis not present

## 2020-09-25 ENCOUNTER — Telehealth: Payer: Self-pay

## 2020-09-25 NOTE — Telephone Encounter (Signed)
LVM for pt to call me back to reschedule sleep study 

## 2020-10-03 ENCOUNTER — Telehealth: Payer: Self-pay

## 2020-10-03 NOTE — Telephone Encounter (Signed)
Error

## 2020-10-04 ENCOUNTER — Ambulatory Visit: Payer: Medicare Other | Admitting: Cardiovascular Disease

## 2020-10-06 ENCOUNTER — Other Ambulatory Visit (HOSPITAL_COMMUNITY): Payer: Self-pay | Admitting: Cardiovascular Disease

## 2020-10-06 DIAGNOSIS — I739 Peripheral vascular disease, unspecified: Secondary | ICD-10-CM

## 2020-10-11 ENCOUNTER — Other Ambulatory Visit: Payer: Self-pay

## 2020-10-11 ENCOUNTER — Encounter: Payer: Self-pay | Admitting: Cardiovascular Disease

## 2020-10-11 ENCOUNTER — Ambulatory Visit (INDEPENDENT_AMBULATORY_CARE_PROVIDER_SITE_OTHER): Payer: Medicare Other | Admitting: Cardiovascular Disease

## 2020-10-11 VITALS — BP 130/78 | HR 86 | Ht 63.0 in | Wt 136.0 lb

## 2020-10-11 DIAGNOSIS — R079 Chest pain, unspecified: Secondary | ICD-10-CM | POA: Diagnosis not present

## 2020-10-11 DIAGNOSIS — I739 Peripheral vascular disease, unspecified: Secondary | ICD-10-CM | POA: Diagnosis not present

## 2020-10-11 DIAGNOSIS — I10 Essential (primary) hypertension: Secondary | ICD-10-CM

## 2020-10-11 DIAGNOSIS — E785 Hyperlipidemia, unspecified: Secondary | ICD-10-CM | POA: Diagnosis not present

## 2020-10-11 DIAGNOSIS — E1165 Type 2 diabetes mellitus with hyperglycemia: Secondary | ICD-10-CM | POA: Diagnosis not present

## 2020-10-11 DIAGNOSIS — Z720 Tobacco use: Secondary | ICD-10-CM | POA: Diagnosis not present

## 2020-10-11 DIAGNOSIS — Z794 Long term (current) use of insulin: Secondary | ICD-10-CM | POA: Diagnosis not present

## 2020-10-11 MED ORDER — NITROGLYCERIN 0.4 MG SL SUBL: SUBLINGUAL_TABLET | SUBLINGUAL | 2 refills | Status: DC

## 2020-10-11 NOTE — Assessment & Plan Note (Signed)
History of peripheral arterial disease status post multiple interventions on her left SFA including Hawk 1 directional atherectomy, stenting using a Viabahn covered stent which was ultimately found to be occluded but angiography 01/29/2016.  She required elective femoropopliteal bypass grafting by Dr. Trula Slade 06/09/2016 with PTFE.  She denies claudication.  Emergent most recent Doppler studies revealed normal ABIs with a patent femoropopliteal bypass graft.  This will be rechecked.

## 2020-10-11 NOTE — Assessment & Plan Note (Signed)
History of ongoing tobacco abuse recalcitrant to risk factor modification. 

## 2020-10-11 NOTE — Assessment & Plan Note (Signed)
History of essential hypertension a blood pressure measured today 130/78.  She is on losartan, hydrochlorothiazide and propranolol.

## 2020-10-11 NOTE — Progress Notes (Signed)
10/11/2020 [name]   Sep 25, 1955  [id number]  Primary Physician Jennifer Squibb, MD Primary Cardiologist: Jennifer Harp MD [name], [place]  HPI:  [name] is a 65 y.o.  thin-appearing divorced African-American female mother of one, grandmother of one grandchild who is currently disabled because of a prior stroke. She was referred by [name], from [facility] podiatry, for evaluation and treatment of critical limb ischemia. I last saw her in the office  09/24/2019. Her cardiovascular risk factor profile is notable for a strong family history of heart disease with the father about a stent, mother who had bypass surgery and a sister who died at age 74 of a myocardial infarction. She has never had a heart attack but apparently has had a stroke in the past with some mild left-sided residua. She has a history of tobacco abuse in the last 43 years of one third pack per day trying to quit currently. She has treated diabetes, hypertension and hyperlipidemia. She had the onset of left ear pain approximately 3 months ago with progression to critical limb ischemia in early June with ischemic appearing left fourth toe. Dopplers in our office performed yesterday revealed a left ABI 0.6 with an occluded left SFA and one-vessel runoff. She will need to be admitted for angiography and potential endovascular therapy for critical limb ischemia. Angiogram to her on 11/03/14 revealing occluded left SFA. I performed [facility] one directional atherectomy, PTA and stenting using a Viabahn covered stent with an excellent angiographic and clinical result. Her pain has resolved. Her critical limb ischemia has resolved. Her Dopplers have normalized. Since I saw her approximately 6 weeks ago she's had 4 episodes of night nitroglycerin responsive chest pain. Recent Dopplers did show a decline in her left ABI from 1.1 6 months ago to .91 with a simultaneous increase in velocity in her mid left SFA. Dopplers performed  12/20/15 revealed a further reduction in her left ABI down to 0.71 with a high-frequency signal in her mid left SFA and worsening symptoms of claudication   I performed angiography on her 01/29/2016 revealing an occluded left SFA stent. She ultimately underwent left femoropopliteal bypass grafting 06/09/2016 by [name] using PTFE. He follows his noninvasively in his office. Her symptoms resolved. She also underwent cardiac catheterization by [name] because of chest pain 12/26/2016 revealing minimal CAD. Unfortunately, she has gone back to smoking10cigarettes a day.  Since I saw her in the [place] year ago she remains cardiovascularly and peripheral vascular stable.  She has had internal bleeding with unexplained weight loss most of which she has put back on.  She does continue to smoke.  Her most recent lower extremity arterial Doppler studies performed 10/14/2019 revealed normal ABIs bilaterally with patent femoropopliteal bypass graft.  She denies chest pain or shortness of breath.    Current Meds  Medication Sig  . acetaminophen (TYLENOL) 325 MG tablet Take 2 tablets (650 mg total) by mouth every 6 (six) hours as needed for mild pain (or Fever >/= 101).  Marland Kitchen albuterol (PROAIR HFA) 108 (90 Base) MCG/ACT inhaler 1-2 inhalations every 4-6 hours as needed for cough or wheeze. (Patient taking differently: Inhale 1-2 puffs into the lungs every 4 (four) hours as needed for shortness of breath.)  . ALPRAZolam (XANAX) 0.5 MG tablet Take 0.5 mg by mouth 2 (two) times daily as needed for anxiety.  . Azelastine HCl 0.15 % SOLN Place 2 sprays into both nostrils 2 (two) times daily. (  Patient taking differently: Place 2 sprays into both nostrils 2 (two) times daily as needed (allergies).)  . bisacodyl (DULCOLAX) 5 MG EC tablet Take 5 mg by mouth daily as needed for moderate constipation.  . citalopram (CELEXA) 10 MG tablet Take 10 mg by mouth daily as needed.   . citalopram (CELEXA) 20 MG tablet Take  20 mg by mouth daily.  . clopidogrel (PLAVIX) 75 MG tablet Take 1 tablet (75 mg total) by mouth daily.  . Continuous Blood Gluc Receiver ([place]) DEVI by Does not apply route.  . D3-50 1.25 MG (50000 UT) capsule Take 50,000 Units by mouth every 7 (seven) days.  . empagliflozin (JARDIANCE) 25 MG TABS tablet Take 1 tablet (25 mg total) by mouth daily before breakfast.  . EPINEPHrine 0.3 mg/0.3 mL IJ SOAJ injection Inject 0.3 mLs (0.3 mg total) into the muscle once.  . Fluocinolone Acetonide Body 0.01 % OIL Apply topically 2 (two) times daily.  . fluticasone (FLONASE) 50 MCG/ACT nasal spray SPRAY 2 SPRAYS INTO EACH NOSTRIL EVERY DAY  . Insulin Disposable Pump (OMNIPOD DASH 5 PACK PODS) MISC CHANGE EVERY 72 HOURS  . insulin lispro (HUMALOG) 100 UNIT/ML injection Max daily 100 units per OMNIPOD  . levocetirizine (XYZAL) 5 MG tablet Take 5 mg by mouth daily.  Marland Kitchen losartan-hydrochlorothiazide (HYZAAR) 50-12.5 MG tablet Take 1 tablet by mouth daily.  . Magnesium 200 MG TABS Take 1 tablet (200 mg total) by mouth daily.  . mupirocin ointment (BACTROBAN) 2 % Apply 1 application topically 2 (two) times daily. (Patient taking differently: Apply 1 application topically 2 (two) times daily as needed.)  . nitroGLYCERIN (NITROSTAT) 0.4 MG SL tablet PLACE 1 TAB UNDER TONGUE EVERY 5 MINS AS NEEDED FOR CHEST PAIN - MAX 3 DOSES THEN 911  . pantoprazole (PROTONIX) 40 MG tablet TAKE 1 TABLET BY MOUTH EVERY DAY (Patient taking differently: Take 40 mg by mouth daily.)  . polyethylene glycol (MIRALAX / GLYCOLAX) 17 g packet Take 17 g by mouth daily as needed for moderate constipation or severe constipation.  . potassium chloride (KLOR-CON) 10 MEQ tablet Take 10 mEq by mouth daily.  . propranolol ER (INDERAL LA) 60 MG 24 hr capsule Take 1 capsule (60 mg total) by mouth every evening.  . rosuvastatin (CRESTOR) 40 MG tablet Take 1 tablet (40 mg total) by mouth every morning.  . SUMAtriptan (IMITREX) 100 MG tablet  Take 100 mg by mouth every 2 (two) hours as needed for migraine.   . temazepam (RESTORIL) 30 MG capsule Take 30 mg by mouth at bedtime as needed for sleep.  . traZODone (DESYREL) 100 MG tablet Take 1 tablet (100 mg total) by mouth at bedtime.  . Vitamin D, Ergocalciferol, (DRISDOL) 1.25 MG (50000 UNIT) CAPS capsule Take 50,000 Units by mouth every 7 (seven) days.     Allergies  Allergen Reactions  . Sulfa Antibiotics Shortness Of Breath and Palpitations  . 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 Fish Allergy Hives and Swelling    Tongue swelling  . Iodine Swelling  . Metformin And Related Diarrhea  . [name] [Insulin Glargine-Lixisenatide] Itching and Other (See Comments)    "tongue swelling"  . Adhesive [Tape] Rash    Paper tape is ok  . Shellfish Allergy Swelling and Rash    Tongue swelling    Social History   Socioeconomic History  . Marital status: Divorced    Spouse name: Not on file  .  Number of children: 1  . Years of education: some college  . Highest education level: Some college, no degree  Occupational History  . 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 Smoking status: Former Smoker    Packs/day: 0.50    Years: 41.00    Pack years: 20.50    Types: Cigarettes    Quit date: 03/01/2020    Years since quitting: 0.6  . Smokeless tobacco: Never Used  . Tobacco comment: Getting ready to start nicotine patches RX by Dr. Gwenlyn Found per pt.  Vaping Use  . Vaping Use: Never used  Substance and Sexual Activity  . Alcohol use: No    Alcohol/week: 0.0 standard drinks  . Drug use: No    Types: "Crack" cocaine    Comment: 05/09/2015.  "quit 07/27/1994"  . Sexual activity: Not Currently    Birth control/protection: Abstinence  Other Topics Concern  . 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  Divorced, living alone in [place].     Social Determinants of Health   Financial Resource Strain: Low Risk   . Difficulty of Paying Living Expenses: Not very hard  Food Insecurity: No Food Insecurity  . Worried About Charity fundraiser in the Last Year: Never true  . Ran Out of Food in the Last Year: Never true  Transportation Needs: No Transportation Needs  . Lack of Transportation (Medical): No  . Lack of Transportation (Non-Medical): No  Physical Activity: Not on file  Stress: No Stress Concern Present  . Feeling of Stress : Only a little  Social Connections: Moderately Integrated  . Frequency of Communication with Friends and Family: More than three times a week  . Frequency of Social Gatherings with Friends and Family: More than three times a week  . Attends Religious Services: More than 4 times per year  . Active Member of Clubs or Organizations: Yes  . Attends Archivist Meetings: More than 4 times per year  . Marital Status: Divorced  Human resources officer Violence: Not At Risk  . Fear of Current or Ex-Partner: No  . Emotionally Abused: No  . Physically Abused: No  . Sexually Abused: No     Review of Systems: General: negative for chills, fever, night sweats or weight changes.  Cardiovascular: negative for chest pain, dyspnea on exertion, edema, orthopnea, palpitations, paroxysmal nocturnal dyspnea or shortness of breath Dermatological: negative for rash Respiratory: negative for cough or wheezing Urologic: negative for hematuria Abdominal: negative for nausea, vomiting, diarrhea, bright red blood per rectum, melena, or hematemesis Neurologic: negative for visual changes, syncope, or dizziness All other systems reviewed and are otherwise negative except as noted above.    Blood pressure 130/78, pulse 86, height 5\' 3"  (1.6 m), weight 136 lb (61.7  kg).  General appearance: alert and no distress Neck: no adenopathy, no carotid bruit, no JVD, supple, symmetrical, trachea midline and thyroid not enlarged, symmetric, no tenderness/mass/nodules Lungs: clear to auscultation bilaterally Heart: regular rate and rhythm, S1, S2 normal, no murmur, click, rub or gallop Extremities: extremities normal, atraumatic, no cyanosis or edema Pulses: 2+ and symmetric Skin: Skin color, texture, turgor normal. No rashes or lesions Neurologic: Alert and oriented X 3, normal strength  and tone. Normal symmetric reflexes. Normal coordination and gait  EKG sinus rhythm 86 with septal Q waves.  I personally reviewed this EKG.  ASSESSMENT AND PLAN:   Essential hypertension History of essential hypertension a blood pressure measured today 130/78.  She is on losartan, hydrochlorothiazide and propranolol.  PAD (peripheral artery disease) (HCC) History of peripheral arterial disease status post multiple interventions on her left SFA including [place] 1 directional atherectomy, stenting using a Viabahn covered stent which was ultimately found to be occluded but angiography 01/29/2016.  She required elective femoropopliteal bypass grafting by [name] 06/09/2016 with PTFE.  She denies claudication.  Emergent most recent Doppler studies revealed normal ABIs with a patent femoropopliteal bypass graft.  This will be rechecked.  Hyperlipidemia LDL goal <70 History of hyperlipidemia on statin therapy with lipid profile performed 08/22/2020 revealing total cholesterol 122, LDL 56 and HDL of 34.  Chest pain at rest History of atypical chest pain with documented essentially normal coronary arteries by cath performed by [name] 12/26/2016.  Tobacco abuse History of ongoing tobacco abuse recalcitrant to risk factor modification.      Jennifer Harp MD FACP,FACC,FAHA, [facility] 10/11/2020 2:20 PM

## 2020-10-11 NOTE — Assessment & Plan Note (Signed)
History of hyperlipidemia on statin therapy with lipid profile performed 08/22/2020 revealing total cholesterol 122, LDL 56 and HDL of 34.

## 2020-10-11 NOTE — Assessment & Plan Note (Signed)
History of atypical chest pain with documented essentially normal coronary arteries by cath performed by Dr. [name] 12/26/2016.

## 2020-10-11 NOTE — Patient Instructions (Signed)
Medication Instructions:  Your physician recommends that you continue on your current medications as directed. Please refer to the Current Medication list given to you today.  *If you need a refill on your cardiac medications before your next appointment, please call your pharmacy*   Lab Work: NONE If you have labs (blood work) drawn today and your tests are completely normal, you will receive your results only by: Marland Kitchen MyChart Message (if you have MyChart) OR . A paper copy in the mail If you have any lab test that is abnormal or we need to change your treatment, we will call you to review the results.   Testing/Procedures: Lower Extremity Arterial Dopplers at Dr. Kennon Holter office   Follow-Up: At [facility], you and your health needs are our priority.  As part of our continuing mission to provide you with exceptional heart care, we have created designated Provider Care Teams.  These Care Teams include your primary Cardiologist (physician) and Advanced Practice Providers (APPs -  Physician Assistants and Nurse Practitioners) who all work together to provide you with the care you need, when you need it.  We recommend signing up for the patient portal called "MyChart".  Sign up information is provided on this After Visit Summary.  MyChart is used to connect with patients for Virtual Visits (Telemedicine).  Patients are able to view lab/test results, encounter notes, upcoming appointments, etc.  Non-urgent messages can be sent to your provider as well.   To learn more about what you can do with MyChart, go to [email or web address].    Your next appointment:   12 month(s)  The format for your next appointment:   In Person  Provider:   You may see Quay Burow, MD or one of the following Advanced Practice Providers on your designated Care Team:    [place], PA-C  Coletta Memos, FNP    Other Instructions

## 2020-10-13 ENCOUNTER — Other Ambulatory Visit: Payer: Self-pay

## 2020-10-13 ENCOUNTER — Ambulatory Visit (INDEPENDENT_AMBULATORY_CARE_PROVIDER_SITE_OTHER): Payer: Medicare Other | Admitting: Neurology

## 2020-10-13 DIAGNOSIS — F5104 Psychophysiologic insomnia: Secondary | ICD-10-CM

## 2020-10-13 DIAGNOSIS — G4721 Circadian rhythm sleep disorder, delayed sleep phase type: Secondary | ICD-10-CM

## 2020-10-13 DIAGNOSIS — G4733 Obstructive sleep apnea (adult) (pediatric): Secondary | ICD-10-CM | POA: Diagnosis not present

## 2020-10-13 DIAGNOSIS — Z72821 Inadequate sleep hygiene: Secondary | ICD-10-CM

## 2020-10-13 DIAGNOSIS — D510 Vitamin B12 deficiency anemia due to intrinsic factor deficiency: Secondary | ICD-10-CM

## 2020-10-13 DIAGNOSIS — G4719 Other hypersomnia: Secondary | ICD-10-CM

## 2020-10-13 DIAGNOSIS — I739 Peripheral vascular disease, unspecified: Secondary | ICD-10-CM

## 2020-10-13 DIAGNOSIS — G4734 Idiopathic sleep related nonobstructive alveolar hypoventilation: Secondary | ICD-10-CM

## 2020-10-13 DIAGNOSIS — R0683 Snoring: Secondary | ICD-10-CM

## 2020-10-16 DIAGNOSIS — E1149 Type 2 diabetes mellitus with other diabetic neurological complication: Secondary | ICD-10-CM | POA: Diagnosis not present

## 2020-10-16 DIAGNOSIS — E1165 Type 2 diabetes mellitus with hyperglycemia: Secondary | ICD-10-CM | POA: Diagnosis not present

## 2020-10-19 ENCOUNTER — Telehealth: Payer: Self-pay | Admitting: Internal Medicine

## 2020-10-19 NOTE — Telephone Encounter (Signed)
Pt calling in stating that she needs a call from the nurse as soon as possible. Pt has been feeling sick on stomach. Sugars range from 294 then drop to 79. At 11:06 it is 119

## 2020-10-20 ENCOUNTER — Encounter: Payer: Self-pay | Admitting: Internal Medicine

## 2020-10-20 NOTE — Telephone Encounter (Signed)
Called and spoke with pt and she advised she is eating better but her sugars have been jumping from 70 to 240+. She thinks her settings may need to be adjusted. Please advise.

## 2020-10-20 NOTE — Telephone Encounter (Signed)
A portal message has been sent to the pt to change sensitivity factor from 25 to [place], MD  [facility]  [facility] [address] [name], [place] [address] Phone: [phone] FAX: [phone]

## 2020-10-22 NOTE — Procedures (Signed)
PATIENT'S NAME:  Linnell, Swords DOB:      24-Mar-1956      MR#:    [id number]     DATE OF RECORDING: 10/13/2020 M. Ronni Rumble M.D.:  Emi Belfast, MD Study Performed:   Baseline Polysomnogram HISTORY:  [name] is a 65 year old African American female patient is seen here upon referral on 08/28/2020 from Dr. [place] Crane. Chief concern according to patient: " I had my tonsils, adenoid removed, I was snoring, I had UPPP, and I am still snoring", also: "I had EMG and NCV and sleep tests with Dr. Merlene Laughter and received no results, no follow up." Has decades of Chronic Insomnia.  Many chronic health problems, including PAD, CAD, PVD, DM with Gastroparesis, GERD, stent placement and remote stroke.  The patient goes to bed at 11 PM but is awake until 2 AM- watches TV -poor sleep hygiene and continues to sleep for only 2-5 hours, wakes for 2-3 bathroom breaks, the first time at 3 AM. The preferred sleep position is sideways with the support of 1-2 pillows, with a raised bed-. Dreams are reportedly frequent/vivid.  About 10 AM is the usual rise time. She reports not feeling refreshed or restored in AM, with symptoms such as dry mouth, morning headaches- dull, and migraines, always residual fatigue. Naps are taken infrequently, lasting from 1-2 h and are more refreshing than nocturnal sleep.  The patient endorsed the Epworth Sleepiness Scale at 10 points.   The patient's weight 139 pounds with a height of 63 (inches), resulting in a BMI of 24.6 kg/m2. The patient's neck circumference measured 14.5 inches.  CURRENT MEDICATIONS: Tylenol, ProAir HFA, Xanax, Azelastine, Dulcolax, Celexa, Plavix, D3-50, Jardiance, Epinephrine, Flonase, Humalog, Xyzal, Hyzaar, Bactroban, Nitrostat, Protonix, Miralax, Klor-Con, Inderal, Crestor, Imitrex, Restoril, Trazodone = Desyrel, Drisdol   PROCEDURE:  This is a multichannel digital polysomnogram utilizing the Somnostar 11.2 system.  Electrodes and sensors were applied  and monitored per AASM Specifications.   EEG, EOG, Chin and Limb EMG, were sampled at 200 Hz.  ECG, Snore and Nasal Pressure, Thermal Airflow, Respiratory Effort, CPAP Flow and Pressure, Oximetry was sampled at 50 Hz. Digital video and audio were recorded.      BASELINE STUDY: Lights Out was at 21:55 and Lights On at 05:52.  Total recording time (TRT) was 477.5 minutes, with a total sleep time (TST) of 290.5 minutes.   The patient's sleep latency was 14.5 minutes.  REM latency was 438 minutes.  The sleep efficiency was below average at 60.8 %.     SLEEP ARCHITECTURE: WASO (Wake after sleep onset) was 177.5 minutes.  There were 23.5 minutes in Stage N1, 232 minutes Stage N2, 7 minutes Stage N3 and 28 minutes in Stage REM.  The percentage of Stage N1 was 8.1%, Stage N2 was 79.9%, Stage N3 was 2.4% and Stage R (REM sleep) was 9.6%.   RESPIRATORY ANALYSIS:  There were a total of 9 respiratory events:  5 obstructive apneas, 0 central apneas and 0 mixed apneas with a total of 5 apneas and an apnea index (AI) of 1.0 /hour. There were 4 hypopneas with a hypopnea index of 0.8 /hour.  The patient also had no respiratory event related arousals (RERAs).      The total APNEA/HYPOPNEA INDEX (AHI) was 1.9/hour.  3 events occurred in REM sleep and 4 events in NREM. The REM AHI was  6.4 /hour, versus a non-REM AHI of 1.4. The patient spent 57 minutes of total sleep time in  the supine position and 234 minutes in non-supine. The supine AHI was 4.3/h,  versus a non-supine AHI of 1.3/h.  OXYGEN SATURATION & C02:  The Wake baseline 02 saturation was 96%, with the lowest being 87%. Time spent below 89% saturation equaled 1 minute.    PERIODIC LIMB MOVEMENTS:  The arousals were noted as: 58 were spontaneous, 0 were associated with PLMs, only 7 were associated with respiratory events.  The patient had a total of 0 Periodic Limb Movements.  The Periodic Limb Movement (PLM) index was 0 and the PLM Arousal index was  0/hour. Audio and video analysis did not show any abnormal or unusual movements, behaviors, phonations or vocalizations.   Mild Snoring was noted. EKG with bradycardia, average heart rate 50 bpm.  Post-study, the patient indicated that sleep was the same as usual. She estimated having slept 5-6 hours and felt her sleep was "uninterrupted".   IMPRESSION:  There was no clinically significant degree of Central or Obstructive Sleep Apnea (OSA) present.  Primary Insomnia - the patient was able to go to sleep with Ambien, was is not able to sustain sleep, waking after 2.5 hours of sleep for another 2.5 hours of wakefulness.  Extremely delayed REM onset was noted- onset after 5.20 AM, likely related to Cymbalta /Celexa.   Bradycardia on single channel EKG, likely related to beta blocker.    RECOMMENDATIONS:  I see no option to optimize therapy for this pleasant patient with chronic insomnia in a neurological sleep lab and clinic, and strongly urge a consultation with Cognitive Behavior Therapy.   This will address lingering psychological barriers to sleep and also retrain for healthy sleep habits.   I certify that I have reviewed the entire raw data recording prior to the issuance of this report in accordance with the Standards of Accreditation of the [place] [employer] (AASM)  Larey Seat, MD Medical Director, [facility] [place] of Neurology and Sleep Medicine (Neurology and Sleep Medicine)

## 2020-10-22 NOTE — Progress Notes (Signed)
IMPRESSION:  1. There was no clinically significant degree of Central or Obstructive Sleep Apnea (OSA) present. 2. Primary Insomnia - the patient was able to go to sleep with Ambien, was is not able to sustain sleep, waking after 2.5 hours of sleep for another 2.5 hours of wakefulness. 3. Extremely delayed REM onset was noted- onset after 5.20 AM, likely related to Cymbalta /Celexa.   4. Bradycardia on single channel EKG, likely related to beta blocker.   RECOMMENDATIONS:  1. Dear Dr. [place] Crane, I see no option to optimize insomnia therapy for this pleasant patient with chronic insomnia in a neurological sleep lab and clinic, and strongly urge a consultation with Cognitive Behavior Therapy.    This will address lingering psychological barriers to sleep and also retrain for healthy sleep habits.

## 2020-10-23 ENCOUNTER — Telehealth: Payer: Self-pay | Admitting: Neurology

## 2020-10-23 NOTE — Telephone Encounter (Signed)
Called the pt and reviewed the sleep study results with the patient in detail. Patient agreed that this was a normal nights sleep for her. Advised that if insomnia is a concern then she would recommend treatment with CBT. Reviewed with her what that process looked like. Advised the patient that if she had questions or would like to discuss further, we could schedule a f/u visit but otherwise Dr Brett Fairy would recommend continuing to practice good sleep hygiene. Pt verbalized understanding. Pt had no questions at this time but was encouraged to call back if questions arise.

## 2020-10-23 NOTE — Telephone Encounter (Signed)
-----   Message from Larey Seat, MD sent at 10/22/2020  5:27 PM EDT ----- IMPRESSION:  1. There was no clinically significant degree of Central or Obstructive Sleep Apnea (OSA) present. 2. Primary Insomnia - the patient was able to go to sleep with Ambien, was is not able to sustain sleep, waking after 2.5 hours of sleep for another 2.5 hours of wakefulness. 3. Extremely delayed REM onset was noted- onset after 5.20 AM, likely related to Cymbalta /Celexa.   4. Bradycardia on single channel EKG, likely related to beta blocker.   RECOMMENDATIONS:  1. Dear Dr. [place] Crane, I see no option to optimize insomnia therapy for this pleasant patient with chronic insomnia in a neurological sleep lab and clinic, and strongly urge a consultation with Cognitive Behavior Therapy.    This will address lingering psychological barriers to sleep and also retrain for healthy sleep habits.

## 2020-10-25 ENCOUNTER — Ambulatory Visit (HOSPITAL_COMMUNITY): Admission: RE | Admit: 2020-10-25 | Payer: Medicare Other | Source: Ambulatory Visit

## 2020-11-10 ENCOUNTER — Encounter (HOSPITAL_COMMUNITY): Payer: Medicare Other

## 2020-11-10 DIAGNOSIS — Z794 Long term (current) use of insulin: Secondary | ICD-10-CM | POA: Diagnosis not present

## 2020-11-10 DIAGNOSIS — E1165 Type 2 diabetes mellitus with hyperglycemia: Secondary | ICD-10-CM | POA: Diagnosis not present

## 2020-11-15 DIAGNOSIS — E1165 Type 2 diabetes mellitus with hyperglycemia: Secondary | ICD-10-CM | POA: Diagnosis not present

## 2020-11-15 DIAGNOSIS — E1149 Type 2 diabetes mellitus with other diabetic neurological complication: Secondary | ICD-10-CM | POA: Diagnosis not present

## 2020-11-30 ENCOUNTER — Ambulatory Visit: Payer: Medicare Other | Admitting: Internal Medicine

## 2020-12-06 ENCOUNTER — Other Ambulatory Visit: Payer: Self-pay | Admitting: Cardiovascular Disease

## 2020-12-10 DIAGNOSIS — E1165 Type 2 diabetes mellitus with hyperglycemia: Secondary | ICD-10-CM | POA: Diagnosis not present

## 2020-12-10 DIAGNOSIS — Z794 Long term (current) use of insulin: Secondary | ICD-10-CM | POA: Diagnosis not present

## 2020-12-14 ENCOUNTER — Other Ambulatory Visit: Payer: Self-pay

## 2020-12-14 ENCOUNTER — Ambulatory Visit
Admission: EM | Admit: 2020-12-14 | Discharge: 2020-12-14 | Disposition: A | Discharge status: Home / Self Care | Payer: Medicare Other | Attending: Emergency Medicine | Admitting: Emergency Medicine

## 2020-12-14 DIAGNOSIS — L02214 Cutaneous abscess of groin: Secondary | ICD-10-CM

## 2020-12-14 MED ORDER — DOXYCYCLINE HYCLATE 100 MG PO CAPS: 100.0000 mg | ORAL_CAPSULE | Freq: Two times a day (BID) | ORAL | 0 refills | Status: DC

## 2020-12-14 NOTE — Discharge Instructions (Addendum)
Keep dry and covered for next 24-48 hours Remove packing in 48 hours either at home or return here After packing is removed in you may then begin appling warm compresses 3-4x daily for 10-15 minutes.  You may then wash site daily with warm water and mild soap Keep covered to avoid friction Take antibiotic as prescribed and to completion Return sooner or go to the ED if you have any new or worsening symptoms such as increased redness, swelling, pain, nausea, vomiting, fever, chills, etc..Marland Kitchen

## 2020-12-14 NOTE — ED Triage Notes (Signed)
Pt states that she has an abscess on her vagina. Pt states that it keeps coming back. X2 weeks

## 2020-12-14 NOTE — ED Provider Notes (Signed)
[facility]   [id number] 12/14/20 Arrival Time: 1448   [id number]  SUBJECTIVE:  [name] is a 65 y.o. female who presents with a possible abscess of her LT groin x 2 weeks. Gradual unset.  Does admit to shaving and using a razor.  Sore to the touch.  Denies alleviating factors with warm compress, or topical treatments.  Reports previous symptoms in the past that improved with incision and drainage.  Denies fever, chills, nausea, vomiting.    ROS: As per HPI.  All other pertinent ROS negative.     Past Medical History:  Diagnosis Date   Allergic urticaria 07/10/2015   Allergy    Anemia    hx   Angioedema 07/10/2015   Anxiety    Arthritis    "neck, left hand" (09/14/2012)   Asthma    Cancer ([place]) 1985   ovarian, no treatment except surgery   Cataract    Complication of anesthesia    [place] TO RIGHT   Critical lower limb ischemia ([place])    10/2014 s/p L SFA stenting   Diverticulosis of colon with hemorrhage April 2013   GERD (gastroesophageal reflux disease)    GI bleed    H/O hiatal hernia    Heart attack ([place])    2003 mild MI, March 2013 mild MI   Hidradenitis    groin   History of blood transfusion 1985 AND 2013   Hyperlipidemia    Hypertension    Irritable bowel syndrome    Left-sided weakness    since stroke, left eye trouble seeing   Migraines    Mild CAD    a. Cath 09/2010: mild luminal irregularities of LAD, 30% prox RCA and 20-30% mRCA, EF 65%.   Neuropathy    Obesity    Osteoporosis    PAD (peripheral artery disease) ([place])    a. critical limb ischemia s/p PTA/stenting of L SFA 10/2014. c. occ prior SFA stent by angio 01/2016, for possible PV bypass.   Pneumonia    baby   Recurrent upper respiratory infection (URI)    S/P arterial stent-mid Lt SFA 11/03/14 11/04/2014   Schatzki's ring    Sinus problem    Stomach ulcer 1972   non-bleeding   Stroke ([place]) 07-2007, 07-2008, 07-2009   total 3 strokes; mild left sided  weakness and left eye "jumps".   Type II diabetes mellitus (HCC)    Vitamin B 12 deficiency 07-15-2013   Past Surgical History:  Procedure Laterality Date   ABDOMINAL HYSTERECTOMY  1985   ADENOIDECTOMY     ANTERIOR CERVICAL DECOMP/DISCECTOMY FUSION  2002   ANTERIOR CERVICAL DECOMP/DISCECTOMY FUSION N/A 04/08/2014   Procedure: Cervical Six-Seven ANTERIOR CERVICAL DECOMPRESSION/DISCECTOMY FUSION Plating and Bonegraft  2 LEVELS;  Surgeon: Ashok Pall, MD;  Location: [place] [facility]URO ORS;  Service: Neurosurgery;  Laterality: N/A;  Cervical Six-Seven ANTERIOR CERVICAL DECOMPRESSION/DISCECTOMY FUSION Plating and Bonegraft  2 LEVELS   APPENDECTOMY  1985   AXILLARY HIDRADENITIS EXCISION  1990-2008   bilateral   BACK SURGERY     BREAST BIOPSY Right 2007   BREAST CYST EXCISION Right 2008   BREAST REDUCTION SURGERY     CARDIAC CATHETERIZATION  2004   mild disease   CATARACT EXTRACTION W/PHACO Left 05/16/2015   Procedure: CATARACT EXTRACTION PHACO AND INTRAOCULAR LENS PLACEMENT (IOC);  Surgeon: Rutherford Guys, MD;  Location: [facility];  Service: Ophthalmology;  Laterality: Left;  CDE: 4.24   CATARACT EXTRACTION W/PHACO Right 05/30/2015   Procedure: CATARACT  EXTRACTION RIGHT EYE PHACO AND INTRAOCULAR LENS PLACEMENT ;  Surgeon: Rutherford Guys, MD;  Location: [facility];  Service: Ophthalmology;  Laterality: Right;  CDE:4.08   CHOLECYSTECTOMY  1990's   COLONOSCOPY  08/12/2011   Procedure: COLONOSCOPY;  Surgeon: Ladene Artist, MD,FACG;  Location: [facility] ENDOSCOPY;  Service: Endoscopy;  Laterality: N/A;   COLONOSCOPY  06/19/2006   COLONOSCOPY WITH PROPOFOL N/A 07/02/2019   Procedure: COLONOSCOPY WITH PROPOFOL;  Surgeon: Rogene Houston, MD;  [facility];  Service: Endoscopy;  Laterality: N/A;   cyst thigh Right    ESOPHAGOGASTRODUODENOSCOPY  08/12/2011   Procedure: ESOPHAGOGASTRODUODENOSCOPY (EGD);  Surgeon: Ladene Artist, MD,FACG;  Location: [facility] ENDOSCOPY;  Service: Endoscopy;  Laterality: N/A;    ESOPHAGOGASTRODUODENOSCOPY  06/04/2005   ESOPHAGOGASTRODUODENOSCOPY (EGD) WITH PROPOFOL N/A 07/01/2019   Procedure: ESOPHAGOGASTRODUODENOSCOPY (EGD) WITH PROPOFOL;  Surgeon: Rogene Houston, MD;  [facility];  Service: Endoscopy;  Laterality: N/A;   FEMORAL-POPLITEAL BYPASS GRAFT Left 06/19/2016   Procedure: Left Leg BYPASS GRAFT FEMORAL-POPLITEAL ARTERY;  Surgeon: Serafina Mitchell, MD;  Location: [place];  Service: Vascular;  Laterality: Left;   GIVENS CAPSULE STUDY  08/13/2011   Procedure: GIVENS CAPSULE STUDY;  Surgeon: Ladene Artist, MD,FACG;  Location: [facility] ENDOSCOPY;  Service: Endoscopy;  Laterality: N/A;   HAMMER TOE SURGERY Bilateral ~ 2000   HEMOSTASIS CLIP PLACEMENT  07/02/2019   Procedure: HEMOSTASIS CLIP PLACEMENT;  Surgeon: Rogene Houston, MD;  [facility];  Service: Endoscopy;;  hepatic flexure   HIATAL HERNIA REPAIR     HYDRADENITIS EXCISION  01/2011; 03/2012   'groin and abdomen; 03/2012" (09/14/2012)   HYDRADENITIS EXCISION  04/01/2012   Procedure: EXCISION HYDRADENITIS GROIN;  Surgeon: Pedro Earls, MD;  Location: [facility];  Service: General;  Laterality: Bilateral;  Excision of Hydradenitis of Perineum   HYDRADENITIS EXCISION N/A 09/17/2013   Procedure: EXCISION PERINEAL HIDRADENITIS ;  Surgeon: Pedro Earls, MD;  Location: [facility];  Service: General;  Laterality: N/A;  also in the pubis area   LEFT HEART CATH AND CORONARY ANGIOGRAPHY N/A 12/26/2016   Procedure: LEFT HEART CATH AND CORONARY ANGIOGRAPHY;  Surgeon: [name], Peter M, MD;  Location: [place] CV LAB;  Service: Cardiovascular;  Laterality: N/A;   MASS EXCISION Right 09/17/2013   Procedure: EXCISION MASS;  Surgeon: Pedro Earls, MD;  Location: [facility];  Service: General;  Laterality: Right;   NISSEN FUNDOPLICATION  [id number]   PERIPHERAL VASCULAR CATHETERIZATION N/A 11/03/2014   Procedure: Lower Extremity Angiography;  Surgeon: Lorretta Harp, MD;  Location: [place] CV LAB;  Service:  Cardiovascular;  Laterality: N/A;   PERIPHERAL VASCULAR CATHETERIZATION N/A 01/29/2016   Procedure: Lower Extremity Angiography;  Surgeon: Lorretta Harp, MD;  Location: [place] CV LAB;  Service: Cardiovascular;  Laterality: N/A;   POLYPECTOMY  07/02/2019   Procedure: POLYPECTOMY;  Surgeon: Rogene Houston, MD;  [facility];  Service: Endoscopy;;   POSTERIOR LUMBAR FUSION  2008 X 2   REDUCTION MAMMAPLASTY  1996?   TONSILLECTOMY AND ADENOIDECTOMY  1959 AND 2000   UVULOPALATOPHARYNGOPLASTY, TONSILLECTOMY AND SEPTOPLASTY  2000's   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   Fish Allergy Hives and Swelling    Tongue swelling   Iodine Swelling   Metformin And Related Diarrhea   Soliqua [Insulin Glargine-Lixisenatide] Itching and Other (  See Comments)    "tongue swelling"   Adhesive [Tape] Rash    Paper tape is ok   Shellfish Allergy Swelling and Rash    Tongue swelling   No current facility-administered medications on file prior to encounter.   Current Outpatient Medications on File Prior to Encounter  Medication Sig Dispense Refill   acetaminophen (TYLENOL) 325 MG tablet Take 2 tablets (650 mg total) by mouth every 6 (six) hours as needed for mild pain (or Fever >/= 101). 12 tablet 2   albuterol (PROAIR HFA) 108 (90 Base) MCG/ACT inhaler 1-2 inhalations every 4-6 hours as needed for cough or wheeze. (Patient taking differently: Inhale 1-2 puffs into the lungs every 4 (four) hours as needed for shortness of breath.) 1 Inhaler 1   ALPRAZolam (XANAX) 0.5 MG tablet Take 0.5 mg by mouth 2 (two) times daily as needed for anxiety.     Azelastine HCl 0.15 % SOLN Place 2 sprays into both nostrils 2 (two) times daily. (Patient taking differently: Place 2 sprays into both nostrils 2 (two) times daily as needed (allergies).) 30 mL 5   bisacodyl (DULCOLAX)  5 MG EC tablet Take 5 mg by mouth daily as needed for moderate constipation.     citalopram (CELEXA) 10 MG tablet Take 10 mg by mouth daily as needed.      citalopram (CELEXA) 20 MG tablet Take 20 mg by mouth daily.     clopidogrel (PLAVIX) 75 MG tablet Take 1 tablet (75 mg total) by mouth daily. 30 tablet 2   Continuous Blood Gluc Receiver ([place]) DEVI by Does not apply route.     D3-50 1.25 MG (50000 UT) capsule Take 50,000 Units by mouth every 7 (seven) days.     empagliflozin (JARDIANCE) 25 MG TABS tablet Take 1 tablet (25 mg total) by mouth daily before breakfast. 30 tablet 6   EPINEPHrine 0.3 mg/0.3 mL IJ SOAJ injection Inject 0.3 mLs (0.3 mg total) into the muscle once. 1 Device 1   Fluocinolone Acetonide Body 0.01 % OIL Apply topically 2 (two) times daily.     fluticasone (FLONASE) 50 MCG/ACT nasal spray SPRAY 2 SPRAYS INTO EACH NOSTRIL EVERY DAY     Insulin Disposable Pump (OMNIPOD DASH 5 PACK PODS) MISC CHANGE EVERY 72 HOURS 30 each 6   insulin lispro (HUMALOG) 100 UNIT/ML injection Max daily 100 units per OMNIPOD 90 mL 3   levocetirizine (XYZAL) 5 MG tablet Take 5 mg by mouth daily.     losartan-hydrochlorothiazide (HYZAAR) 50-12.5 MG tablet Take 1 tablet by mouth daily. 90 tablet 3   Magnesium 200 MG TABS Take 1 tablet (200 mg total) by mouth daily. 30 tablet 6   mupirocin ointment (BACTROBAN) 2 % Apply 1 application topically 2 (two) times daily. (Patient taking differently: Apply 1 application topically 2 (two) times daily as needed.) 30 g 1   nitroGLYCERIN (NITROSTAT) 0.4 MG SL tablet PLACE 1 TAB UNDER TONGUE EVERY 5 MINS AS NEEDED FOR CHEST PAIN - MAX 3 DOSES THEN 911 25 tablet 2   pantoprazole (PROTONIX) 40 MG tablet TAKE 1 TABLET BY MOUTH EVERY DAY (Patient taking differently: Take 40 mg by mouth daily.) 30 tablet 11   polyethylene glycol (MIRALAX / GLYCOLAX) 17 g packet Take 17 g by mouth daily as needed for moderate constipation or severe constipation.      potassium chloride (KLOR-CON) 10 MEQ tablet Take 10 mEq by mouth daily.     propranolol ER (INDERAL LA) 60 MG 24  hr capsule Take 1 capsule (60 mg total) by mouth every evening. 90 capsule 3   rosuvastatin (CRESTOR) 40 MG tablet Take 1 tablet (40 mg total) by mouth every morning. 90 tablet 3   SUMAtriptan (IMITREX) 100 MG tablet Take 100 mg by mouth every 2 (two) hours as needed for migraine.      temazepam (RESTORIL) 30 MG capsule Take 30 mg by mouth at bedtime as needed for sleep.     traZODone (DESYREL) 100 MG tablet Take 1 tablet (100 mg total) by mouth at bedtime. 30 tablet 0   Vitamin D, Ergocalciferol, (DRISDOL) 1.25 MG (50000 UNIT) CAPS capsule Take 50,000 Units by mouth every 7 (seven) days.     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 Gwenlyn Found per pt.  Vaping Use   Vaping Use: Never used  Substance and Sexual Activity   Alcohol use: No    Alcohol/week: 0.0 standard drinks   Drug use: No    Types: "Crack" cocaine    Comment: 05/09/2015.  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  Divorced, living alone in [place].     Social Determinants  of Health   Financial Resource Strain: Low Risk    Difficulty of Paying Living Expenses: Not very hard  Food Insecurity: No Food Insecurity   Worried About Charity fundraiser in the Last Year: Never true   Ran Out of Food in the Last Year: Never true  Transportation Needs: No Transportation Needs   Lack of Transportation (Medical): No   Lack of Transportation (Non-Medical): No  Physical Activity: Not on file  Stress: No Stress Concern Present   Feeling of Stress : Only a little  Social Connections: Moderately Integrated   Frequency of Communication with Friends and Family: More than three times a week   Frequency of Social Gatherings with Friends and Family: More than three times a week   Attends Religious Services: More than 4 times per year   Active Member of [employer] or Organizations: Yes   Attends Music therapist: More than 4 times per year   Marital Status: Divorced  Human resources officer Violence: Not At Risk   Fear of Current or Ex-Partner: No   Emotionally Abused: No   Physically Abused: No   Sexually Abused: No   Family History  Problem Relation Age of Onset   Breast cancer Mother    Heart disease Mother    Hypertension Mother    Diabetes Father  Heart disease Father    Stroke Father    Heart disease Sister    Hypertension Sister    Hypertension Sister    Hyperlipidemia Sister    Diabetes Sister    Allergic rhinitis Sister    Emphysema Other        great uncle   Aneurysm Sister        brain   Colon cancer Paternal Uncle    Angioedema Neg Hx    Asthma Neg Hx    Eczema Neg Hx    Immunodeficiency Neg Hx    Urticaria Neg Hx     OBJECTIVE:  Vitals:   12/14/20 1456 12/14/20 1458  BP:  140/70  Pulse:  (!) 103  Temp:  98.5 F (36.9 C)  TempSrc:  Oral  SpO2:  95%  Weight: 133 lb (60.3 kg)   Height: 5' 2.5" (1.588 m)      General appearance: alert; no distress Skin: 3 x 4 cm induration of her LT groin; tender to touch; no active  drainage Psychological: alert and cooperative; normal mood and affect  Procedure: Verbal consent obtained. Area over induration cleaned with betadine. Lidocaine 2% with epinephrine used to obtain local anesthesia. The most fluctuant portion of the abscess was incised with a #11 blade scalpel. Abscess cavity explored and evacuated. Loculations broken up with a curved hemostat as best as possible given patient discomfort. Cavity packed with packing material and dressed with a clean gauze dressing. Minimal bleeding. No complications.  ASSESSMENT & PLAN:  1. Abscess of groin, left     Meds ordered this encounter  Medications   doxycycline (VIBRAMYCIN) 100 MG capsule    Sig: Take 1 capsule (100 mg total) by mouth 2 (two) times daily.    Dispense:  20 capsule    Refill:  0    Order Specific Question:   Supervising Provider    Answer:   Raylene Everts [id number]    Keep dry and covered for next 24-48 hours Remove packing in 48 hours either at home or return here After packing is removed in you may then begin appling warm compresses 3-4x daily for 10-15 minutes.  You may then wash site daily with warm water and mild soap Keep covered to avoid friction Take antibiotic as prescribed and to completion Return sooner or go to the ED if you have any new or worsening symptoms such as increased redness, swelling, pain, nausea, vomiting, fever, chills, etc...   Reviewed expectations re: course of current medical issues. Questions answered. Outlined signs and symptoms indicating need for more acute intervention. Patient verbalized understanding. After Visit Summary given.           Lestine Box, PA-C 12/14/20 1639

## 2020-12-15 ENCOUNTER — Telehealth: Payer: Self-pay | Admitting: Emergency Medicine

## 2020-12-15 MED ORDER — MELOXICAM 15 MG PO TABS: 15.0000 mg | ORAL_TABLET | Freq: Every day | ORAL | 0 refills | Status: DC

## 2020-12-15 NOTE — Telephone Encounter (Signed)
Mobic sent in for pain

## 2020-12-18 DIAGNOSIS — E1149 Type 2 diabetes mellitus with other diabetic neurological complication: Secondary | ICD-10-CM | POA: Diagnosis not present

## 2020-12-18 DIAGNOSIS — E1165 Type 2 diabetes mellitus with hyperglycemia: Secondary | ICD-10-CM | POA: Diagnosis not present

## 2020-12-19 DIAGNOSIS — E1149 Type 2 diabetes mellitus with other diabetic neurological complication: Secondary | ICD-10-CM | POA: Diagnosis not present

## 2020-12-19 DIAGNOSIS — Z794 Long term (current) use of insulin: Secondary | ICD-10-CM | POA: Diagnosis not present

## 2020-12-19 DIAGNOSIS — E1165 Type 2 diabetes mellitus with hyperglycemia: Secondary | ICD-10-CM | POA: Diagnosis not present

## 2020-12-22 DIAGNOSIS — I10 Essential (primary) hypertension: Secondary | ICD-10-CM | POA: Diagnosis not present

## 2020-12-22 DIAGNOSIS — E119 Type 2 diabetes mellitus without complications: Secondary | ICD-10-CM | POA: Diagnosis not present

## 2020-12-26 DIAGNOSIS — Z8673 Personal history of transient ischemic attack (TIA), and cerebral infarction without residual deficits: Secondary | ICD-10-CM | POA: Diagnosis not present

## 2020-12-26 DIAGNOSIS — Z7984 Long term (current) use of oral hypoglycemic drugs: Secondary | ICD-10-CM | POA: Diagnosis not present

## 2020-12-26 DIAGNOSIS — Z961 Presence of intraocular lens: Secondary | ICD-10-CM | POA: Diagnosis not present

## 2020-12-26 DIAGNOSIS — E119 Type 2 diabetes mellitus without complications: Secondary | ICD-10-CM | POA: Diagnosis not present

## 2020-12-26 DIAGNOSIS — Z794 Long term (current) use of insulin: Secondary | ICD-10-CM | POA: Diagnosis not present

## 2021-01-09 DIAGNOSIS — E1165 Type 2 diabetes mellitus with hyperglycemia: Secondary | ICD-10-CM | POA: Diagnosis not present

## 2021-01-09 DIAGNOSIS — Z794 Long term (current) use of insulin: Secondary | ICD-10-CM | POA: Diagnosis not present

## 2021-01-17 DIAGNOSIS — E870 Hyperosmolality and hypernatremia: Secondary | ICD-10-CM | POA: Diagnosis not present

## 2021-01-17 DIAGNOSIS — E782 Mixed hyperlipidemia: Secondary | ICD-10-CM | POA: Diagnosis not present

## 2021-01-17 DIAGNOSIS — L732 Hidradenitis suppurativa: Secondary | ICD-10-CM | POA: Diagnosis not present

## 2021-01-17 DIAGNOSIS — I739 Peripheral vascular disease, unspecified: Secondary | ICD-10-CM | POA: Diagnosis not present

## 2021-01-17 DIAGNOSIS — I10 Essential (primary) hypertension: Secondary | ICD-10-CM | POA: Diagnosis not present

## 2021-01-17 DIAGNOSIS — E1165 Type 2 diabetes mellitus with hyperglycemia: Secondary | ICD-10-CM | POA: Diagnosis not present

## 2021-01-17 DIAGNOSIS — K581 Irritable bowel syndrome with constipation: Secondary | ICD-10-CM | POA: Diagnosis not present

## 2021-01-17 DIAGNOSIS — G43009 Migraine without aura, not intractable, without status migrainosus: Secondary | ICD-10-CM | POA: Diagnosis not present

## 2021-01-17 DIAGNOSIS — Z0001 Encounter for general adult medical examination with abnormal findings: Secondary | ICD-10-CM | POA: Diagnosis not present

## 2021-01-17 DIAGNOSIS — E559 Vitamin D deficiency, unspecified: Secondary | ICD-10-CM | POA: Diagnosis not present

## 2021-01-17 DIAGNOSIS — K219 Gastro-esophageal reflux disease without esophagitis: Secondary | ICD-10-CM | POA: Diagnosis not present

## 2021-01-18 DIAGNOSIS — E1149 Type 2 diabetes mellitus with other diabetic neurological complication: Secondary | ICD-10-CM | POA: Diagnosis not present

## 2021-01-18 DIAGNOSIS — E1165 Type 2 diabetes mellitus with hyperglycemia: Secondary | ICD-10-CM | POA: Diagnosis not present

## 2021-01-23 ENCOUNTER — Telehealth: Payer: Self-pay | Admitting: Internal Medicine

## 2021-01-23 NOTE — Telephone Encounter (Signed)
Patient called seeking to speak with a nurse about having abdominal pain.

## 2021-01-24 ENCOUNTER — Ambulatory Visit: Payer: Medicare Other | Admitting: Nurse Practitioner

## 2021-01-24 NOTE — Telephone Encounter (Signed)
Patient with rectal bleeding for the last few days.  She is seeing blood in the stool and on the tissue.  She is offered an appointment for today with Tye Savoy RNP, she declines due to another appointment.  She has been rescheduled for 02/19/21 to 02/06/21 with Carl Best, RNP

## 2021-02-06 ENCOUNTER — Ambulatory Visit (INDEPENDENT_AMBULATORY_CARE_PROVIDER_SITE_OTHER): Payer: Medicare Other | Admitting: Nurse Practitioner

## 2021-02-06 ENCOUNTER — Other Ambulatory Visit (INDEPENDENT_AMBULATORY_CARE_PROVIDER_SITE_OTHER): Payer: Medicare Other

## 2021-02-06 ENCOUNTER — Encounter: Payer: Self-pay | Admitting: Nurse Practitioner

## 2021-02-06 VITALS — BP 100/60 | HR 88 | Ht 64.0 in | Wt 140.0 lb

## 2021-02-06 DIAGNOSIS — R140 Abdominal distension (gaseous): Secondary | ICD-10-CM | POA: Diagnosis not present

## 2021-02-06 DIAGNOSIS — K625 Hemorrhage of anus and rectum: Secondary | ICD-10-CM

## 2021-02-06 DIAGNOSIS — K5900 Constipation, unspecified: Secondary | ICD-10-CM

## 2021-02-06 LAB — CBC
HCT: 41.1 % (ref 36.0–46.0)
Hemoglobin: 13.4 g/dL (ref 12.0–15.0)
MCHC: 32.7 g/dL (ref 30.0–36.0)
MCV: 87.3 fl (ref 78.0–100.0)
Platelets: 254 10*3/uL (ref 150.0–400.0)
RBC: 4.71 Mil/uL (ref 3.87–5.11)
RDW: 13.4 % (ref 11.5–15.5)
WBC: 6.1 10*3/uL (ref 4.0–10.5)

## 2021-02-06 MED ORDER — LUBIPROSTONE 24 MCG PO CAPS: 24.0000 ug | ORAL_CAPSULE | Freq: Two times a day (BID) | ORAL | 1 refills | Status: DC

## 2021-02-06 NOTE — Progress Notes (Addendum)
02/06/2021 [name] [id number] 04/18/1956   Chief Complaint:  Rectal bleeding   History of Present Illness: [name] is a 65 year old female with a past medical history significant for coronary artery disease, critical limb ischemia status post femoropopliteal bypass, diabetes mellitus, hypertension, CVA, IBS and colon polyps.  She is followed by Dr. Carlean Purl.  She presents to our office today for further evaluation regarding constipation, abdominal bloat and rectal bleeding.  She has intermittent constipation and she can go 2 weeks without passing a bowel movement.  Approximately 2 to 3 weeks ago, she took [place] locks 3-4 tabs and 1 to 2 hours later she took 4-8 capfuls of MiraLAX without a prompt response.  She passed a large amount of solid and loose stool 2 days later.  She reported seeing a small amount of bright red blood on the toilet tissue 1 week ago.  No associated anorectal pain.  She infrequently takes Tramadol for back pain.  She is very cautious with taking any narcotic as she reported having a remote history of narcotic abuse.  Her most recent colonoscopy was 03/02/2020 and 3 tubular adenomatous polyps were removed from the transverse and hepatic flexure and she was advised to repeat a colonoscopy in 3 years.  Paternal uncle with history of colon cancer.  Colonoscopy 03/02/2020: - Three 2 to 4 mm tubular adenomatous polyps in the proximal transverse colon and at the hepatic flexure, removed with a cold snare. Resected and retrieved. - Mild pancolonic diverticulosis. - Non-bleeding internal hemorrhoids. - The examined portion of the ileum was normal. - The examination was otherwise normal on direct and retroflexion views. No masses.  Current Medications, Allergies, Past Medical History, Past Surgical History, Family History and Social History were reviewed in [employer] record.  Review of Systems:   Constitutional: Negative for fever,  sweats, chills or weight loss.  Respiratory: Negative for shortness of breath.   Cardiovascular: Negative for chest pain, palpitations and leg swelling.  Gastrointestinal: See HPI.  Musculoskeletal: Negative for back pain or muscle aches.  Neurological: Negative for dizziness, headaches or paresthesias.   Physical Exam: BP 100/60   Pulse 88   Ht 5\' 4"  (1.626 m)   Wt 140 lb (63.5 kg)   SpO2 96%   BMI 24.03 kg/m  General: 65 year old female in no acute distress. Head: Normocephalic and atraumatic. Eyes: No scleral icterus. Conjunctiva pink . Ears: Normal auditory acuity. Mouth: Dentition intact. No ulcers or lesions.  Lungs: Clear throughout to auscultation. Heart: Regular rate and rhythm, no murmur. Abdomen: Soft, nontender and nondistended. No masses or hepatomegaly. Normal bowel sounds x 4 quadrants.  Two vertical and one  lower horizontal abdominal scars intact. Rectal: Internal hemorrhoids without prolapse.  No blood or stool in the rectal vault.  CMA Melissa present during exam. Musculoskeletal: Symmetrical with no gross deformities. Extremities: No edema. Neurological: Alert oriented x 4. No focal deficits.  Psychological: Alert and cooperative. Normal mood and affect  Assessment and Recommendations:  42) 65 year old female with IBS, alternating constipation and diarrhea (mostly constipation) with abdominal bloating -IBgard 1 p.o. twice daily -Trial with Amitiza 24 mcg 1 p.o. twice daily as tolerated -May also take MiraLAX Q HS and Dulcolax 1-2 tabs every third night as needed -Drink 8 glasses of water daily  2) Rectal bleeding, suspect from internal hemorrhoids -Apply a small amount of Desitin inside the anal opening and to the external anal area tid as needed for anal or  hemorrhoidal irritation/bleeding.  -CBC  3) History of tubular adenomatous colon polyp.  Three (2 - 4 mm) tubular adenomatous polyps removed from the colon per colonoscopy 02/2020.  -Dr. Carlean Purl to  verify colonoscopy recall date   Patient to contact her office if her symptoms persist or worsen Follow-up as needed  Repeat routine colonoscopy October 2026 is my recommendation.  We will update the records. Gatha Mayer, MD, [facility]

## 2021-02-06 NOTE — Patient Instructions (Addendum)
LABS:  Lab work has been ordered for you today. Our lab is located in the basement. Press "B" on the elevator. The lab is located at the first door on the left as you exit the elevator.  HEALTHCARE LAWS AND MY CHART RESULTS: Due to recent changes in healthcare laws, you may see the results of your imaging and laboratory studies on MyChart before your provider has had a chance to review them.   We understand that in some cases there may be results that are confusing or concerning to you. Not all laboratory results come back in the same time frame and the provider may be waiting for multiple results in order to interpret others.  Please give [place] 48 hours in order for your provider to thoroughly review all the results before contacting the office for clarification of your results.   MEDICATION: We have sent the following medication to your pharmacy for you to pick up at your convenience: Amitiza 24 mcg, take 1 twice a day.  OVER THE COUNTER MEDICATION Please purchase the following medications over the counter and take as directed:  IBgard 1 capsule twice a day as needed for abdominal bloating/cramping. Desitin: Apply a small amount to the external and internal anal area three times a day as needed. Continue Miralax at bedtime. May take Dulcolax 1-2 tablets every third night as needed.  It was great seeing you today! Thank you for entrusting me with your care and choosing [facility].  Noralyn Pick, CRNP  The [place] [facility] providers would like to encourage you to use [facility] to communicate with providers for non-urgent requests or questions.  Due to long hold times on the telephone, sending your provider a message by [facility] may be faster and more efficient way to get a response. Please allow 48 business hours for a response.  Please remember that this is for non-urgent requests/questions.  If you are age 28 or older, your body mass index should be between 23-30. Your Body  mass index is 24.03 kg/m. If this is out of the aforementioned range listed, please consider follow up with your Primary Care Provider.  If you are age 43 or younger, your body mass index should be between 19-25. Your Body mass index is 24.03 kg/m. If this is out of the aformentioned range listed, please consider follow up with your Primary Care Provider.

## 2021-02-07 ENCOUNTER — Other Ambulatory Visit: Payer: Self-pay

## 2021-02-07 ENCOUNTER — Encounter: Payer: Self-pay | Admitting: Internal Medicine

## 2021-02-07 ENCOUNTER — Ambulatory Visit (INDEPENDENT_AMBULATORY_CARE_PROVIDER_SITE_OTHER): Payer: Medicare Other | Admitting: Internal Medicine

## 2021-02-07 VITALS — BP 132/80 | HR 88 | Ht 64.0 in | Wt 138.0 lb

## 2021-02-07 DIAGNOSIS — E1159 Type 2 diabetes mellitus with other circulatory complications: Secondary | ICD-10-CM

## 2021-02-07 DIAGNOSIS — E1165 Type 2 diabetes mellitus with hyperglycemia: Secondary | ICD-10-CM | POA: Diagnosis not present

## 2021-02-07 DIAGNOSIS — R0789 Other chest pain: Secondary | ICD-10-CM

## 2021-02-07 DIAGNOSIS — Z794 Long term (current) use of insulin: Secondary | ICD-10-CM | POA: Diagnosis not present

## 2021-02-07 DIAGNOSIS — E1142 Type 2 diabetes mellitus with diabetic polyneuropathy: Secondary | ICD-10-CM | POA: Diagnosis not present

## 2021-02-07 NOTE — Patient Instructions (Addendum)
-   Continue Jardiance 25 mg daily  - Continue Humalog through the pump     Pump   Omnipod Settings   Insulin type   Humalog    Basal rate       0000 -0400 1.45 u/h    0400-1700 1.30   1700-000 1.40      I:C ratio       0000 1:14                  Sensitivity       0000  35      Goal       0000  120          HOW TO TREAT LOW BLOOD SUGARS (Blood sugar LESS THAN 70 MG/DL) Please follow the RULE OF 15 for the treatment of hypoglycemia treatment (when your (blood sugars are less than 70 mg/dL)   STEP 1: Take 15 grams of carbohydrates when your blood sugar is low, which includes:  3-4 GLUCOSE TABS  OR 3-4 OZ OF JUICE OR REGULAR SODA OR ONE TUBE OF GLUCOSE GEL    STEP 2: RECHECK blood sugar in 15 MINUTES STEP 3: If your blood sugar is still low at the 15 minute recheck --> then, go back to STEP 1 and treat AGAIN with another 15 grams of carbohydrates.

## 2021-02-07 NOTE — Progress Notes (Signed)
Name: [name]  Age/ Sex: 65 y.o., female   MRN/ DOB: [id number], [birth year]     PCP: [name], MD   Reason for Endocrinology Evaluation: Type 2 Diabetes Mellitus  Initial Endocrine Consultative Visit: 04/17/2020    PATIENT IDENTIFIER: [name] is a 65 y.o. female with a past medical history of T2DM, PAD ,CAD, HTN and Hx of pancreatitis  . The patient has followed with Endocrinology clinic since 04/17/2020 for consultative assistance with management of her diabetes.  DIABETIC HISTORY:  [name] was diagnosed with DM in 2003, She is intolerant to Metformin due to diarrhea and Soliqua due to itching . Her hemoglobin A1c has ranged from 7.1%  in 20, peaking at 9.5% in 2015  Has chronic GI issues - Dr Carlean Purl   Islet Ab's and GAD-65 negative   Jardiance started 07/2020 Omnipod started 08/2020   SUBJECTIVE:   During the last visit (4/28 /2022):  A1c 11.3 %, we adjusted MDI regimen and started Jardiance      Today (02/07/2021): [name] is here for diabetes follow up .  She checks her blood sugars multiple times a day through CGM. The patient has not had hypoglycemic episodes since the last clinic visit.    This patient with type 2 diabetes is treated with Omnipod (insulin pump). During the visit the pump basal and bolus doses were reviewed including carb/insulin rations and supplemental doses. The clinical list was updated. The glucose meter download was reviewed in detail to determine if the current pump settings are providing the best glycemic control without excessive hypoglycemia.  She has constipation but no nausea or diarrhea , she was evaluated by GI and they prescribed a medication for her but she continues with abdominal bloating  During her visit today as I asked her to take a deep breath she ended up with a cramp in her chest and pain, patient was allowed to rest and continue shallow breathing until her symptoms resolved   Pump and meter  download:   Pump   Omnipod Settings   Insulin type   Humalog    Basal rate       0000 -0400 1.45 u/h    0400-1700 1.35   1700-0000 1.45      I:C ratio       0000 1:14                  Sensitivity       0000  35      Goal       0000  120           Type & Model of Pump: Omnipod Insulin Type: Currently using Humalog .   PUMP STATISTICS: Average BG: 167 Average Daily Carbs (g): 90.5 Average Total Daily Insulin: 38.1 units Average Daily Basal: 29.2 (77 %) Average Daily Bolus: 8.9 (23 %)  Overrides 0% Bolus per day 1.6  HOME DIABETES REGIMEN:  Humalog Jardiance 25 mg daily     Statin: yes ACE-I/ARB: yes    CONTINUOUS GLUCOSE MONITORING RECORD INTERPRETATION    Dates of Recording: 9/22 - 02/07/2021 Sensor description:dexcom  Results statistics:   CGM use % of time 86  Average and SD 153/40  Time in range 78 %  % Time Above 180 19  % Time above 250 2  % Time Below target <1    Glycemic patterns summary: Her BG's have been mostly optimal throughout the day and night with occasional hypoglycemia during  the day postprandial  Hypoglycemic episodes occurred during the day  Overnight periods: Trends down     DIABETIC COMPLICATIONS: Microvascular complications:  Neuropathy  Denies: CKD, retinopathy Last Eye Exam: Completed 12/2019  Macrovascular complications:  PVD ( S/P femoral-popliteal bypass), Mild CAD  Denies: CAD, CVA   HISTORY:  Past Medical History:  Past Medical History:  Diagnosis Date   Allergic urticaria 07/10/2015   Allergy    Anemia    hx   Angioedema 07/10/2015   Anxiety    Arthritis    "neck, left hand" (09/14/2012)   Asthma    Cancer ([place]) 1985   ovarian, no treatment except surgery   Cataract    Complication of anesthesia    [place] TO RIGHT   Critical lower limb ischemia ([place])    10/2014 s/p L SFA stenting   Diverticulosis of colon with hemorrhage April 2013   GERD (gastroesophageal reflux  disease)    GI bleed    H/O hiatal hernia    Heart attack ([place])    2003 mild MI, March 2013 mild MI   Hidradenitis    groin   History of blood transfusion 1985 AND 2013   Hyperlipidemia    Hypertension    Irritable bowel syndrome    Left-sided weakness    since stroke, left eye trouble seeing   Migraines    Mild CAD    a. Cath 09/2010: mild luminal irregularities of LAD, 30% prox RCA and 20-30% mRCA, EF 65%.   Neuropathy    Obesity    Osteoporosis    PAD (peripheral artery disease) ([place])    a. critical limb ischemia s/p PTA/stenting of L SFA 10/2014. c. occ prior SFA stent by angio 01/2016, for possible PV bypass.   Pneumonia    baby   Recurrent upper respiratory infection (URI)    S/P arterial stent-mid Lt SFA 11/03/14 11/04/2014   Schatzki's ring    Sinus problem    Stomach ulcer 1972   non-bleeding   Stroke ([place]) 07-2007, 07-2008, 07-2009   total 3 strokes; mild left sided weakness and left eye "jumps".   Type II diabetes mellitus ([place])    Vitamin B 12 deficiency 07-15-2013   Past Surgical History:  Past Surgical History:  Procedure Laterality Date   ABDOMINAL HYSTERECTOMY  1985   ADENOIDECTOMY     ANTERIOR CERVICAL DECOMP/DISCECTOMY FUSION  2002   ANTERIOR CERVICAL DECOMP/DISCECTOMY FUSION N/A 04/08/2014   Procedure: Cervical Six-Seven ANTERIOR CERVICAL DECOMPRESSION/DISCECTOMY FUSION Plating and Bonegraft  2 LEVELS;  Surgeon: Ashok Pall, MD;  Location: [facility];  Service: Neurosurgery;  Laterality: N/A;  Cervical Six-Seven ANTERIOR CERVICAL DECOMPRESSION/DISCECTOMY FUSION Plating and Bonegraft  2 LEVELS   APPENDECTOMY  1985   AXILLARY HIDRADENITIS EXCISION  1990-2008   bilateral   BACK SURGERY     BREAST BIOPSY Right 2007   BREAST CYST EXCISION Right 2008   BREAST REDUCTION SURGERY     CARDIAC CATHETERIZATION  2004   mild disease   CATARACT EXTRACTION W/PHACO Left 05/16/2015   Procedure: CATARACT EXTRACTION PHACO AND INTRAOCULAR LENS PLACEMENT (IOC);  Surgeon:  Rutherford Guys, MD;  Location: [facility];  Service: Ophthalmology;  Laterality: Left;  CDE: 4.24   CATARACT EXTRACTION W/PHACO Right 05/30/2015   Procedure: CATARACT EXTRACTION RIGHT EYE PHACO AND INTRAOCULAR LENS PLACEMENT ;  Surgeon: Rutherford Guys, MD;  Location: [facility];  Service: Ophthalmology;  Laterality: Right;  CDE:4.08   CHOLECYSTECTOMY  1990's   COLONOSCOPY  08/12/2011  Procedure: COLONOSCOPY;  Surgeon: Ladene Artist, MD,FACG;  Location: [facility] ENDOSCOPY;  Service: Endoscopy;  Laterality: N/A;   COLONOSCOPY  06/19/2006   COLONOSCOPY WITH PROPOFOL N/A 07/02/2019   Procedure: COLONOSCOPY WITH PROPOFOL;  Surgeon: Rogene Houston, MD;  [facility];  Service: Endoscopy;  Laterality: N/A;   cyst thigh Right    ESOPHAGOGASTRODUODENOSCOPY  08/12/2011   Procedure: ESOPHAGOGASTRODUODENOSCOPY (EGD);  Surgeon: Ladene Artist, MD,FACG;  Location: [facility] ENDOSCOPY;  Service: Endoscopy;  Laterality: N/A;   ESOPHAGOGASTRODUODENOSCOPY  06/04/2005   ESOPHAGOGASTRODUODENOSCOPY (EGD) WITH PROPOFOL N/A 07/01/2019   Procedure: ESOPHAGOGASTRODUODENOSCOPY (EGD) WITH PROPOFOL;  Surgeon: Rogene Houston, MD;  [facility];  Service: Endoscopy;  Laterality: N/A;   FEMORAL-POPLITEAL BYPASS GRAFT Left 06/19/2016   Procedure: Left Leg BYPASS GRAFT FEMORAL-POPLITEAL ARTERY;  Surgeon: Serafina Mitchell, MD;  Location: [facility];  Service: Vascular;  Laterality: Left;   GIVENS CAPSULE STUDY  08/13/2011   Procedure: GIVENS CAPSULE STUDY;  Surgeon: Ladene Artist, MD,FACG;  Location: [facility] ENDOSCOPY;  Service: Endoscopy;  Laterality: N/A;   HAMMER TOE SURGERY Bilateral ~ 2000   HEMOSTASIS CLIP PLACEMENT  07/02/2019   Procedure: HEMOSTASIS CLIP PLACEMENT;  Surgeon: Rogene Houston, MD;  [facility];  Service: Endoscopy;;  hepatic flexure   HIATAL HERNIA REPAIR     HYDRADENITIS EXCISION  01/2011; 03/2012   'groin and abdomen; 03/2012" (09/14/2012)   HYDRADENITIS EXCISION  04/01/2012   Procedure: EXCISION  HYDRADENITIS GROIN;  Surgeon: Pedro Earls, MD;  Location: [facility];  Service: General;  Laterality: Bilateral;  Excision of Hydradenitis of Perineum   HYDRADENITIS EXCISION N/A 09/17/2013   Procedure: EXCISION PERINEAL HIDRADENITIS ;  Surgeon: Pedro Earls, MD;  Location: [facility];  Service: General;  Laterality: N/A;  also in the pubis area   LEFT HEART CATH AND CORONARY ANGIOGRAPHY N/A 12/26/2016   Procedure: LEFT HEART CATH AND CORONARY ANGIOGRAPHY;  Surgeon: [name], Peter M, MD;  Location: [place] CV LAB;  Service: Cardiovascular;  Laterality: N/A;   MASS EXCISION Right 09/17/2013   Procedure: EXCISION MASS;  Surgeon: Pedro Earls, MD;  Location: [facility];  Service: General;  Laterality: Right;   NISSEN FUNDOPLICATION  7867   PERIPHERAL VASCULAR CATHETERIZATION N/A 11/03/2014   Procedure: Lower Extremity Angiography;  Surgeon: Lorretta Harp, MD;  Location: [place] CV LAB;  Service: Cardiovascular;  Laterality: N/A;   PERIPHERAL VASCULAR CATHETERIZATION N/A 01/29/2016   Procedure: Lower Extremity Angiography;  Surgeon: Lorretta Harp, MD;  Location: [place] CV LAB;  Service: Cardiovascular;  Laterality: N/A;   POLYPECTOMY  07/02/2019   Procedure: POLYPECTOMY;  Surgeon: Rogene Houston, MD;  [facility];  Service: Endoscopy;;   POSTERIOR LUMBAR FUSION  2008 X 2   REDUCTION MAMMAPLASTY  1996?   TONSILLECTOMY AND ADENOIDECTOMY  1959 AND 2000   UVULOPALATOPHARYNGOPLASTY, TONSILLECTOMY AND SEPTOPLASTY  2000's   Social History:  reports that she has been smoking cigarettes. She has a 20.50 pack-year smoking history. She has never used smokeless tobacco. She reports that she does not drink alcohol and does not use drugs. Family History:  Family History  Problem Relation Age of Onset   Breast cancer Mother    Heart disease Mother    Hypertension Mother    Diabetes Father    Heart disease Father    Stroke Father    Heart disease Sister    Hypertension Sister     Hypertension Sister  Hyperlipidemia Sister    Diabetes Sister    Allergic rhinitis Sister    Emphysema Other        great uncle   Aneurysm Sister        brain   Colon cancer Paternal Uncle    Angioedema Neg Hx    Asthma Neg Hx    Eczema Neg Hx    Immunodeficiency Neg Hx    Urticaria Neg Hx      HOME MEDICATIONS: Allergies as of 02/07/2021       Reactions   Sulfa Antibiotics Shortness Of Breath, Palpitations   Codeine Other (See Comments)   Recovering Addict does not like to take Narcotics   Fish Allergy Hives, Swelling   Tongue swelling   Iodine Swelling   Metformin And Related Diarrhea   Soliqua [insulin Glargine-lixisenatide] Itching, Other (See Comments)   "tongue swelling"   Adhesive [tape] Rash   Paper tape is ok   Shellfish Allergy Swelling, Rash   Tongue swelling        Medication List        Accurate as of February 07, 2021  3:29 PM. If you have any questions, ask your nurse or doctor.          acetaminophen 325 MG tablet Commonly known as: TYLENOL Take 2 tablets (650 mg total) by mouth every 6 (six) hours as needed for mild pain (or Fever >/= 101).   albuterol 108 (90 Base) MCG/ACT inhaler Commonly known as: ProAir HFA 1-2 inhalations every 4-6 hours as needed for cough or wheeze. What changed:  how much to take how to take this when to take this reasons to take this additional instructions   ALPRAZolam 0.5 MG tablet Commonly known as: XANAX Take 0.5 mg by mouth 2 (two) times daily as needed for anxiety.   Azelastine HCl 0.15 % Soln Place 2 sprays into both nostrils 2 (two) times daily. What changed:  when to take this reasons to take this   bisacodyl 5 MG EC tablet Commonly known as: DULCOLAX Take 5 mg by mouth daily as needed for moderate constipation.   citalopram 10 MG tablet Commonly known as: CELEXA Take 10 mg by mouth daily as needed.   citalopram 20 MG tablet Commonly known as: CELEXA Take 20 mg by mouth daily.    clopidogrel 75 MG tablet Commonly known as: PLAVIX Take 1 tablet (75 mg total) by mouth daily.   D3-50 1.25 MG (50000 UT) capsule Generic drug: Cholecalciferol Take 50,000 Units by mouth every 7 (seven) days.   Dexcom G6 Receiver Devi by Does not apply route.   doxycycline 100 MG capsule Commonly known as: VIBRAMYCIN Take 1 capsule (100 mg total) by mouth 2 (two) times daily.   empagliflozin 25 MG Tabs tablet Commonly known as: Jardiance Take 1 tablet (25 mg total) by mouth daily before breakfast.   EPINEPHrine 0.3 mg/0.3 mL Soaj injection Commonly known as: EPI-PEN Inject 0.3 mLs (0.3 mg total) into the muscle once.   Fluocinolone Acetonide Body 0.01 % Oil Apply topically 2 (two) times daily.   fluticasone 50 MCG/ACT nasal spray Commonly known as: FLONASE SPRAY 2 SPRAYS INTO EACH NOSTRIL EVERY DAY   insulin lispro 100 UNIT/ML injection Commonly known as: HUMALOG Max daily 100 units per OMNIPOD   levocetirizine 5 MG tablet Commonly known as: XYZAL Take 5 mg by mouth daily.   losartan-hydrochlorothiazide 50-12.5 MG tablet Commonly known as: Hyzaar Take 1 tablet by mouth daily.   lubiprostone 24 MCG  capsule Commonly known as: AMITIZA Take 1 capsule (24 mcg total) by mouth 2 (two) times daily with a meal.   Magnesium 200 MG Tabs Take 1 tablet (200 mg total) by mouth daily.   meloxicam 15 MG tablet Commonly known as: Mobic Take 1 tablet (15 mg total) by mouth daily. Toxic effects may be increased with concurrent administration of NSAIDs and Selective Serotonin Reuptake Inhibitors. The risk of upper gastrointestinal bleeding may be increased. Patients taking both drugs concurrently should be educated about the signs and symptoms of GI bleeding.   mupirocin ointment 2 % Commonly known as: BACTROBAN Apply 1 application topically 2 (two) times daily. What changed:  when to take this reasons to take this   nitroGLYCERIN 0.4 MG SL tablet Commonly known as:  NITROSTAT PLACE 1 TAB UNDER TONGUE EVERY 5 MINS AS NEEDED FOR CHEST PAIN - MAX 3 DOSES THEN 911   Omnipod DASH Pods (Gen 4) Misc CHANGE EVERY 72 HOURS   pantoprazole 40 MG tablet Commonly known as: PROTONIX TAKE 1 TABLET BY MOUTH EVERY DAY   polyethylene glycol 17 g packet Commonly known as: MIRALAX / GLYCOLAX Take 17 g by mouth daily as needed for moderate constipation or severe constipation.   potassium chloride 10 MEQ tablet Commonly known as: KLOR-CON Take 10 mEq by mouth daily.   propranolol ER 60 MG 24 hr capsule Commonly known as: INDERAL LA Take 1 capsule (60 mg total) by mouth every evening.   rosuvastatin 40 MG tablet Commonly known as: CRESTOR Take 1 tablet (40 mg total) by mouth every morning.   SUMAtriptan 100 MG tablet Commonly known as: IMITREX Take 100 mg by mouth every 2 (two) hours as needed for migraine.   temazepam 30 MG capsule Commonly known as: RESTORIL Take 30 mg by mouth at bedtime as needed for sleep.   traZODone 100 MG tablet Commonly known as: DESYREL Take 1 tablet (100 mg total) by mouth at bedtime.   Vitamin D (Ergocalciferol) 1.25 MG (50000 UNIT) Caps capsule Commonly known as: DRISDOL Take 50,000 Units by mouth every 7 (seven) days.         OBJECTIVE:   Vital Signs: BP 132/80 (BP Location: Left Arm, Patient Position: Sitting, Cuff Size: Small)   Pulse 88   Ht 5\' 4"  (1.626 m)   Wt 138 lb (62.6 kg)   SpO2 96%   BMI 23.69 kg/m   Wt Readings from Last 3 Encounters:  02/07/21 138 lb (62.6 kg)  02/06/21 140 lb (63.5 kg)  12/14/20 133 lb (60.3 kg)     Exam: General: Pt appears well and is in NAD  Lungs: Clear with good BS bilat with no rales, rhonchi, or wheezes  Heart: RRR with normal S1 and S2 and no gallops; no murmurs; no rub  Extremities: No pretibial edema.  Neuro: MS is good with appropriate affect, pt is alert and Ox3      DM foot exam: 04/17/2020   The skin of the feet is intact without sores or  ulcerations. The pedal pulses are undetectable The sensation is intact to a screening 5.07, 10 gram monofilament bilaterally      DATA REVIEWED:  12/23/2020 A1c 8.4%  08/22/2020 BUN 12 Creatinine 0.780 TG 191 LDL 56  Glutamic Acid Decarb Ab <5 IU/mL <5     ISLET CELL ANTIBODY SCREEN Negative  ASSESSMENT / PLAN / RECOMMENDATIONS:   1) Type 2 Diabetes Mellitus, with improving glycemic control, With neuropathic and macrovascular complications - Most recent A1c  of 8.4 %. Goal A1c < 7.0 %.     -I have praised the patient on improved glycemic control with an A1c down from 11.3%, I have encouraged her to continue with lifestyle changes -Has Hx of pancreatitis in 2010, so DPP-4 inhibitors and GLp-1 agonists are  CONTRAINDICATED - GAD-65 and Islet cell Ab's negative  -She has been noted with hypoglycemia during the day, will reduce basal rate during the day as below  MEDICATIONS: Jardiance 25 mg daily  Humalog   Pump   Omnipod Settings   Insulin type   Humalog    Basal rate       0000 -0400 1.45 u/h    0400-1700 1.30   1700-000 1.40      I:C ratio       0000 1:14                  Sensitivity       0000  35      Goal       0000  120         EDUCATION / INSTRUCTIONS: BG monitoring instructions: Patient is instructed to check her blood sugars 4 times a day, before meals and bedtime . Call [facility] clinic if: BG persistently < 70  I reviewed the Rule of 15 for the treatment of hypoglycemia in detail with the patient. Literature supplied.   2) Diabetic complications:  Eye: Does not have known diabetic retinopathy.  Neuro/ Feet: Does  have known diabetic peripheral neuropathy .  Renal: Patient does not have known baseline CKD.    3) Chest wall pain:   -This was triggered by inhalation during exam, this resolved spontaneously, I did offer calling the ambulance but the patient declined, per patient this is chronic and intermittent    F/U in 4  months    Signed electronically by: Mack Guise, MD  [facility] End[facility]  [facility] [address] [place], [place] [address] Phone: [phone] FAX: [phone]   CC: [name], MD [place] [address] Phone: [phone]  Fax: [phone]  Return to Endocrinology clinic as below: Future Appointments  Date Time Provider [place]  02/07/2021  3:40 PM Smiley Birr, Melanie Crazier, MD [id number] None

## 2021-02-12 NOTE — Progress Notes (Signed)
Recall date updated and other recall's removed as instructed.

## 2021-02-12 NOTE — Progress Notes (Signed)
Jennifer Mayer, MD  Noralyn Pick, NP Cc: [name], [name], CMA Dr. Lyndel Safe suggested 3 after he did her colonoscopy but I think it should be 5 so October 2026.  I changed health maintenance but we should update the recall for colonoscopy to October 2026.  I have copied PJ on this to do so.   PJ there is a 2020 1 GI recall for an office visit in there that needs to come out as well.  I would also remove Dr. Olevia Perches recall

## 2021-02-17 DIAGNOSIS — E1149 Type 2 diabetes mellitus with other diabetic neurological complication: Secondary | ICD-10-CM | POA: Diagnosis not present

## 2021-02-17 DIAGNOSIS — E1165 Type 2 diabetes mellitus with hyperglycemia: Secondary | ICD-10-CM | POA: Diagnosis not present

## 2021-02-20 ENCOUNTER — Ambulatory Visit
Admission: EM | Admit: 2021-02-20 | Discharge: 2021-02-20 | Disposition: A | Discharge status: Home / Self Care | Payer: Medicare Other | Attending: Family Medicine | Admitting: Family Medicine

## 2021-02-20 DIAGNOSIS — L02214 Cutaneous abscess of groin: Secondary | ICD-10-CM

## 2021-02-20 MED ORDER — DOXYCYCLINE HYCLATE 100 MG PO CAPS: 100.0000 mg | ORAL_CAPSULE | Freq: Two times a day (BID) | ORAL | 0 refills | Status: AC

## 2021-02-20 NOTE — ED Triage Notes (Signed)
Pt presents with reoccurring abscess in her groin that occurred three days ago

## 2021-02-21 ENCOUNTER — Ambulatory Visit: Payer: Medicare Other | Admitting: Physician Assistant

## 2021-02-21 NOTE — ED Provider Notes (Signed)
[name]   [id number] 02/20/21 Arrival Time: 7106  ASSESSMENT & PLAN:  1. Abscess of groin, left     Incision and Drainage Procedure Note  Anesthesia: 1% plain lidocaine  Procedure Details  The procedure, risks and complications have been discussed in detail (including, but not limited to pain and bleeding) with the patient.  The skin induration was prepped and draped in the usual fashion. After adequate local anesthesia, I&D with a #11 blade was performed on the left mons pubis with copious, purulent drainage.  EBL: minimal Drains: none Packing: none Condition: Tolerated procedure well Complications: none.  Meds ordered this encounter  Medications   doxycycline (VIBRAMYCIN) 100 MG capsule    Sig: Take 1 capsule (100 mg total) by mouth 2 (two) times daily.    Dispense:  14 capsule    Refill:  0    Wound care instructions discussed and given in written format. To return in 48 hours for wound check.  Finish all antibiotics. OTC analgesics as needed.  Reviewed expectations re: course of current medical issues. Questions answered. Outlined signs and symptoms indicating need for more acute intervention. Patient verbalized understanding. After Visit Summary given.   SUBJECTIVE:  [name] is a 65 y.o. female who presents with a possible infection of her left groin; h/o same. Onset gradual, approximately several days ago without active drainage and without active bleeding. Symptoms have gradually worsened since beginning. Fever: absent. OTC/home treatment: none.   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   Fish Allergy Hives and Swelling    Tongue swelling   Iodine Swelling   Metformin And Related Diarrhea   Soliqua [Insulin Glargine-Lixisenatide] Itching and Other (See Comments)    "tongue swelling"   Adhesive [Tape] Rash    Paper tape is ok   Shellfish Allergy Swelling and Rash    Tongue swelling    Past Medical History:  Diagnosis Date   Allergic urticaria 07/10/2015   Allergy    Anemia    hx   Angioedema 07/10/2015   Anxiety    Arthritis    "neck, left hand" (09/14/2012)   Asthma    Cancer ([place]) 1985   ovarian, no treatment except surgery   Cataract    Complication of anesthesia    OCCASIONAL TROUBLE TURNING NECK TO RIGHT   Critical lower limb ischemia ([place])    10/2014 s/p L SFA stenting   Diverticulosis of colon with hemorrhage April 2013   GERD (gastroesophageal reflux disease)    GI bleed    H/O hiatal hernia    Heart attack ([place])    2003 mild MI, March 2013 mild MI   Hidradenitis    groin   History of blood transfusion 1985 AND 2013   Hyperlipidemia    Hypertension    Irritable bowel syndrome    Left-sided weakness    since stroke, left eye trouble seeing   Migraines    Mild CAD    a. Cath 09/2010: mild luminal irregularities  of LAD, 30% prox RCA and 20-30% mRCA, EF 65%.   Neuropathy    Obesity    Osteoporosis    PAD (peripheral artery disease) ([place])    a. critical limb ischemia s/p PTA/stenting of L SFA 10/2014. c. occ prior SFA stent by angio 01/2016, for possible PV bypass.   Pneumonia    baby   Recurrent upper respiratory infection (URI)    S/P arterial stent-mid Lt SFA 11/03/14 11/04/2014   Schatzki's ring    Sinus problem    Stomach ulcer 1972   non-bleeding   Stroke ([place]) 07-2007, 07-2008, 07-2009   total 3 strokes; mild left sided weakness and left eye "jumps".   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 Gwenlyn Found per pt.  Vaping Use   Vaping Use: Never used  Substance and Sexual Activity   Alcohol use: No    Alcohol/week: 0.0 standard drinks   Drug use: No    Types: "Crack" cocaine    Comment: 05/09/2015.  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  Divorced, living alone in [place].     Social Determinants of Health   Financial Resource Strain: Low Risk    Difficulty of Paying Living Expenses: Not very hard  Food Insecurity: No Food Insecurity   Worried About Charity fundraiser in the Last Year: Never true   Ran Out of Food in the Last Year: Never true  Transportation Needs: No Transportation Needs   Lack of Transportation (Medical): No   Lack of Transportation (Non-Medical): No  Physical Activity: Not on file  Stress: No Stress Concern Present   Feeling of Stress : Only a little  Social Connections: Moderately Integrated   Frequency of Communication with Friends and Family: More than three times a week   Frequency of Social Gatherings with Friends and Family: More than three times a week    Attends Religious Services: More than 4 times per year   Active Member of [employer] or Organizations: Yes   Attends Music therapist: More than 4 times per year   Marital Status: Divorced   Family History  Problem Relation Age of Onset   Breast cancer Mother    Heart disease Mother    Hypertension Mother    Diabetes Father    Heart disease Father    Stroke Father    Heart disease Sister  Hypertension Sister    Hypertension Sister    Hyperlipidemia Sister    Diabetes Sister    Allergic rhinitis Sister    Emphysema Other        great uncle   Aneurysm Sister        brain   Colon cancer Paternal Uncle    Angioedema Neg Hx    Asthma Neg Hx    Eczema Neg Hx    Immunodeficiency Neg Hx    Urticaria Neg Hx    Past Surgical History:  Procedure Laterality Date   ABDOMINAL HYSTERECTOMY  1985   ADENOIDECTOMY     ANTERIOR CERVICAL DECOMP/DISCECTOMY FUSION  2002   ANTERIOR CERVICAL DECOMP/DISCECTOMY FUSION N/A 04/08/2014   Procedure: Cervical Six-Seven ANTERIOR CERVICAL DECOMPRESSION/DISCECTOMY FUSION Plating and Bonegraft  2 LEVELS;  Surgeon: Ashok Pall, MD;  Location: [facility];  Service: Neurosurgery;  Laterality: N/A;  Cervical Six-Seven ANTERIOR CERVICAL DECOMPRESSION/DISCECTOMY FUSION Plating and Bonegraft  2 LEVELS   APPENDECTOMY  1985   AXILLARY HIDRADENITIS EXCISION  1990-2008   bilateral   BACK SURGERY     BREAST BIOPSY Right 2007   BREAST CYST EXCISION Right 2008   BREAST REDUCTION SURGERY     CARDIAC CATHETERIZATION  2004   mild disease   CATARACT EXTRACTION W/PHACO Left 05/16/2015   Procedure: CATARACT EXTRACTION PHACO AND INTRAOCULAR LENS PLACEMENT (IOC);  Surgeon: Rutherford Guys, MD;  Location: [facility];  Service: Ophthalmology;  Laterality: Left;  CDE: 4.24   CATARACT EXTRACTION W/PHACO Right 05/30/2015   Procedure: CATARACT EXTRACTION RIGHT EYE PHACO AND INTRAOCULAR LENS PLACEMENT ;  Surgeon: Rutherford Guys, MD;  Location: [facility];  Service: Ophthalmology;   Laterality: Right;  CDE:4.08   CHOLECYSTECTOMY  1990's   COLONOSCOPY  08/12/2011   Procedure: COLONOSCOPY;  Surgeon: Ladene Artist, MD,FACG;  [facility];  Service: Endoscopy;  Laterality: N/A;   COLONOSCOPY  06/19/2006   COLONOSCOPY WITH PROPOFOL N/A 07/02/2019   Procedure: COLONOSCOPY WITH PROPOFOL;  Surgeon: Rogene Houston, MD;  [facility];  Service: Endoscopy;  Laterality: N/A;   cyst thigh Right    ESOPHAGOGASTRODUODENOSCOPY  08/12/2011   Procedure: ESOPHAGOGASTRODUODENOSCOPY (EGD);  Surgeon: Ladene Artist, MD,FACG;  Location: [facility] ENDOSCOPY;  Service: Endoscopy;  Laterality: N/A;   ESOPHAGOGASTRODUODENOSCOPY  06/04/2005   ESOPHAGOGASTRODUODENOSCOPY (EGD) WITH PROPOFOL N/A 07/01/2019   Procedure: ESOPHAGOGASTRODUODENOSCOPY (EGD) WITH PROPOFOL;  Surgeon: Rogene Houston, MD;  [facility];  Service: Endoscopy;  Laterality: N/A;   FEMORAL-POPLITEAL BYPASS GRAFT Left 06/19/2016   Procedure: Left Leg BYPASS GRAFT FEMORAL-POPLITEAL ARTERY;  Surgeon: Serafina Mitchell, MD;  Location: [place];  Service: Vascular;  Laterality: Left;   GIVENS CAPSULE STUDY  08/13/2011   Procedure: GIVENS CAPSULE STUDY;  Surgeon: Ladene Artist, MD,FACG;  Location: [facility] ENDOSCOPY;  Service: Endoscopy;  Laterality: N/A;   HAMMER TOE SURGERY Bilateral ~ 2000   HEMOSTASIS CLIP PLACEMENT  07/02/2019   Procedure: HEMOSTASIS CLIP PLACEMENT;  Surgeon: Rogene Houston, MD;  [facility];  Service: Endoscopy;;  hepatic flexure   HIATAL HERNIA REPAIR     HYDRADENITIS EXCISION  01/2011; 03/2012   'groin and abdomen; 03/2012" (09/14/2012)   HYDRADENITIS EXCISION  04/01/2012   Procedure: EXCISION HYDRADENITIS GROIN;  Surgeon: Pedro Earls, MD;  Location: [facility];  Service: General;  Laterality: Bilateral;  Excision of Hydradenitis of Perineum   HYDRADENITIS EXCISION N/A 09/17/2013   Procedure: EXCISION PERINEAL HIDRADENITIS ;  Surgeon: Pedro Earls, MD;  Location:  [facility];  Service:  General;  Laterality: N/A;  also in the pubis area   LEFT HEART CATH AND CORONARY ANGIOGRAPHY N/A 12/26/2016   Procedure: LEFT HEART CATH AND CORONARY ANGIOGRAPHY;  Surgeon: [name], Peter M, MD;  Location: [place] CV LAB;  Service: Cardiovascular;  Laterality: N/A;   MASS EXCISION Right 09/17/2013   Procedure: EXCISION MASS;  Surgeon: Pedro Earls, MD;  Location: [facility];  Service: General;  Laterality: Right;   NISSEN FUNDOPLICATION  5638   PERIPHERAL VASCULAR CATHETERIZATION N/A 11/03/2014   Procedure: Lower Extremity Angiography;  Surgeon: Lorretta Harp, MD;  Location: [place] CV LAB;  Service: Cardiovascular;  Laterality: N/A;   PERIPHERAL VASCULAR CATHETERIZATION N/A 01/29/2016   Procedure: Lower Extremity Angiography;  Surgeon: Lorretta Harp, MD;  Location: [place] CV LAB;  Service: Cardiovascular;  Laterality: N/A;   POLYPECTOMY  07/02/2019   Procedure: POLYPECTOMY;  Surgeon: Rogene Houston, MD;  [facility];  Service: Endoscopy;;   POSTERIOR LUMBAR FUSION  2008 X 2   REDUCTION MAMMAPLASTY  1996?   [place] AND 2000   UVULOPALATOPHARYNGOPLASTY, TONSILLECTOMY AND SEPTOPLASTY  9373'S            Vanessa Kick, MD 02/21/21 [phone]

## 2021-03-13 ENCOUNTER — Telehealth: Payer: Self-pay | Admitting: Internal Medicine

## 2021-03-13 NOTE — Telephone Encounter (Signed)
Patient has now been informed to adjust her basal rate at midnight- 4 a.m. from 1.45 u/h decreased to 1.35. Patient expressed her understanding. Informed patient to give [place] a call if she continues to experience low blood sugars.

## 2021-03-13 NOTE — Telephone Encounter (Signed)
Pt sugars have been going up and down for two weeks. The lowest being 40s, the Dexcom G6 will alarm when her sugars are low and undectable.   Pt contact [phone]

## 2021-03-13 NOTE — Telephone Encounter (Addendum)
03/13/2021 Below 40 around 8:30am  70 around 9am 149 at 11am  106 at 11:30am   Patient states that sugar have been going up and down for last 2 weeks. I attempted to try and download but last date show 02/07/2021. Patient has been taking her medication correctly.

## 2021-03-19 ENCOUNTER — Other Ambulatory Visit: Payer: Self-pay

## 2021-03-19 ENCOUNTER — Ambulatory Visit (INDEPENDENT_AMBULATORY_CARE_PROVIDER_SITE_OTHER): Payer: Medicare Other | Admitting: Internal Medicine

## 2021-03-19 ENCOUNTER — Encounter: Payer: Self-pay | Admitting: Internal Medicine

## 2021-03-19 VITALS — BP 140/84 | HR 84 | Ht 64.0 in | Wt 141.6 lb

## 2021-03-19 DIAGNOSIS — E1142 Type 2 diabetes mellitus with diabetic polyneuropathy: Secondary | ICD-10-CM | POA: Diagnosis not present

## 2021-03-19 DIAGNOSIS — E1165 Type 2 diabetes mellitus with hyperglycemia: Secondary | ICD-10-CM | POA: Diagnosis not present

## 2021-03-19 DIAGNOSIS — Z794 Long term (current) use of insulin: Secondary | ICD-10-CM | POA: Diagnosis not present

## 2021-03-19 DIAGNOSIS — E1159 Type 2 diabetes mellitus with other circulatory complications: Secondary | ICD-10-CM | POA: Diagnosis not present

## 2021-03-19 LAB — GLUCOSE, POCT (MANUAL RESULT ENTRY): POC Glucose: 152 mg/dl — AB (ref 70–99)

## 2021-03-19 LAB — POCT GLYCOSYLATED HEMOGLOBIN (HGB A1C): Hemoglobin A1C: 8.7 % — AB (ref 4.0–5.6)

## 2021-03-19 NOTE — Progress Notes (Signed)
Name: [name]  Age/ Sex: 65 y.o., female   MRN/ DOB: [id number], August 13, 1955     PCP: [name], MD   Reason for Endocrinology Evaluation: Type 2 Diabetes Mellitus  Initial Endocrine Consultative Visit: 04/17/2020    PATIENT IDENTIFIER: [name] is a 65 y.o. female with a past medical history of T2DM, PAD ,CAD, HTN and Hx of pancreatitis  . The patient has followed with Endocrinology clinic since 04/17/2020 for consultative assistance with management of her diabetes.  DIABETIC HISTORY:  [name] was diagnosed with DM in 2003, She is intolerant to Metformin due to diarrhea and Soliqua due to itching . Her hemoglobin A1c has ranged from 7.1%  in 20, peaking at 9.5% in 2015  Has chronic GI issues - Dr Carlean Purl   Islet Ab's and GAD-65 negative   Jardiance started 07/2020 Omnipod started 08/2020   SUBJECTIVE:   During the last visit (02/07/2021):  A1c 11.3 %, we adjusted pump setting and and continued Jardiance      Today (03/19/2021): [name] is here for diabetes follow up .  She checks her blood sugars multiple times a day through CGM. The patient has not had hypoglycemic episodes since the last clinic visit.   Has chronic nausea  Has had a hx of rib fracture and now its aggravating her     This patient with type 2 diabetes is treated with Omnipod (insulin pump). During the visit the pump basal and bolus doses were reviewed including carb/insulin rations and supplemental doses. The clinical list was updated. The glucose meter download was reviewed in detail to determine if the current pump settings are providing the best glycemic control without excessive hypoglycemia.  She has constipation but no nausea or diarrhea , she was evaluated by GI and they prescribed a medication for her but she continues with abdominal bloating  During her visit today as I asked her to take a deep breath she ended up with a cramp in her chest and pain, patient was allowed  to rest and continue shallow breathing until her symptoms resolved   Pump and meter download:    Pump   Omnipod Settings   Insulin type   Humalog    Basal rate       0000 -0400 1.35 u/h    0400-1700 1.30   1700-000 1.40      I:C ratio       0000 1:14                  Sensitivity       0000  25      Goal       0000  120         Type & Model of Pump: Omnipod Insulin Type: Currently using Humalog .   PUMP STATISTICS: Average BG: 171 Average Daily Carbs (g): 102.1 Average Total Daily Insulin: 43.1 Average Daily Basal: 32 (74 %) Average Daily Bolus: 11.2 (26%)  Overrides 0% Bolus per day 2.2    CONTINUOUS GLUCOSE MONITORING RECORD INTERPRETATION    Dates of Recording: 11/1-11/14/2022  Sensor description: dexcom  Results statistics:   CGM use % of time 100  Average and SD 165/45  Time in range    62    %  % Time Above 180 32  % Time above 250 4  % Time Below target 1    Glycemic patterns summary: hyperglycemia noted overnight, BG's trend down during the day  Hyperglycemic episodes  during the night and postprandial   Hypoglycemic episodes occurred sporadic during the day   Overnight periods: high         HOME DIABETES REGIMEN:  Humalog Jardiance 25 mg daily     Statin: yes ACE-I/ARB: yes      DIABETIC COMPLICATIONS: Microvascular complications:  Neuropathy  Denies: CKD, retinopathy Last Eye Exam: Completed 12/26/2020  Macrovascular complications:  PVD ( S/P femoral-popliteal bypass), Mild CAD  Denies: CAD, CVA   HISTORY:  Past Medical History:  Past Medical History:  Diagnosis Date   Allergic urticaria 07/10/2015   Allergy    Anemia    hx   Angioedema 07/10/2015   Anxiety    Arthritis    "neck, left hand" (09/14/2012)   Asthma    Cancer ([place]) 1985   ovarian, no treatment except surgery   Cataract    Complication of anesthesia    [place] TO RIGHT   Critical lower limb ischemia ([name])     10/2014 s/p L SFA stenting   Diverticulosis of colon with hemorrhage April 2013   GERD (gastroesophageal reflux disease)    GI bleed    H/O hiatal hernia    Heart attack ([place])    2003 mild MI, March 2013 mild MI   Hidradenitis    groin   History of blood transfusion 1985 AND 2013   Hyperlipidemia    Hypertension    Irritable bowel syndrome    Left-sided weakness    since stroke, left eye trouble seeing   Migraines    Mild CAD    a. Cath 09/2010: mild luminal irregularities of LAD, 30% prox RCA and 20-30% mRCA, EF 65%.   Neuropathy    Obesity    Osteoporosis    PAD (peripheral artery disease) ([place])    a. critical limb ischemia s/p PTA/stenting of L SFA 10/2014. c. occ prior SFA stent by angio 01/2016, for possible PV bypass.   Pneumonia    baby   Recurrent upper respiratory infection (URI)    S/P arterial stent-mid Lt SFA 11/03/14 11/04/2014   Schatzki's ring    Sinus problem    Stomach ulcer 1972   non-bleeding   Stroke ([place]) 07-2007, 07-2008, 07-2009   total 3 strokes; mild left sided weakness and left eye "jumps".   Type II diabetes mellitus ([place])    Vitamin B 12 deficiency 07-15-2013   Past Surgical History:  Past Surgical History:  Procedure Laterality Date   ABDOMINAL HYSTERECTOMY  1985   ADENOIDECTOMY     ANTERIOR CERVICAL DECOMP/DISCECTOMY FUSION  2002   ANTERIOR CERVICAL DECOMP/DISCECTOMY FUSION N/A 04/08/2014   Procedure: Cervical Six-Seven ANTERIOR CERVICAL DECOMPRESSION/DISCECTOMY FUSION Plating and Bonegraft  2 LEVELS;  Surgeon: Ashok Pall, MD;  Location: [facility];  Service: Neurosurgery;  Laterality: N/A;  Cervical Six-Seven ANTERIOR CERVICAL DECOMPRESSION/DISCECTOMY FUSION Plating and Bonegraft  2 LEVELS   APPENDECTOMY  1985   AXILLARY HIDRADENITIS EXCISION  1990-2008   bilateral   BACK SURGERY     BREAST BIOPSY Right 2007   BREAST CYST EXCISION Right 2008   BREAST REDUCTION SURGERY     CARDIAC CATHETERIZATION  2004   mild disease   CATARACT EXTRACTION  W/PHACO Left 05/16/2015   Procedure: CATARACT EXTRACTION PHACO AND INTRAOCULAR LENS PLACEMENT (IOC);  Surgeon: Rutherford Guys, MD;  Location: [facility];  Service: Ophthalmology;  Laterality: Left;  CDE: 4.24   CATARACT EXTRACTION W/PHACO Right 05/30/2015   Procedure: CATARACT EXTRACTION RIGHT EYE PHACO  AND INTRAOCULAR LENS PLACEMENT ;  Surgeon: Rutherford Guys, MD;  Location: [facility];  Service: Ophthalmology;  Laterality: Right;  CDE:4.08   CHOLECYSTECTOMY  1990's   COLONOSCOPY  08/12/2011   Procedure: COLONOSCOPY;  Surgeon: Ladene Artist, MD,FACG;  Location[facility];  Service: Endoscopy;  Laterality: N/A;   COLONOSCOPY  06/19/2006   COLONOSCOPY WITH PROPOFOL N/A 07/02/2019   Procedure: COLONOSCOPY WITH PROPOFOL;  Surgeon: Rogene Houston, MD;  [facility];  Service: Endoscopy;  Laterality: N/A;   cyst thigh Right    ESOPHAGOGASTRODUODENOSCOPY  08/12/2011   Procedure: ESOPHAGOGASTRODUODENOSCOPY (EGD);  Surgeon: Ladene Artist, MD,FACG;  Location: [facility] ENDOSCOPY;  Service: Endoscopy;  Laterality: N/A;   ESOPHAGOGASTRODUODENOSCOPY  06/04/2005   ESOPHAGOGASTRODUODENOSCOPY (EGD) WITH PROPOFOL N/A 07/01/2019   Procedure: ESOPHAGOGASTRODUODENOSCOPY (EGD) WITH PROPOFOL;  Surgeon: Rogene Houston, MD;  [facility];  Service: Endoscopy;  Laterality: N/A;   FEMORAL-POPLITEAL BYPASS GRAFT Left 06/19/2016   Procedure: Left Leg BYPASS GRAFT FEMORAL-POPLITEAL ARTERY;  Surgeon: Serafina Mitchell, MD;  Location: [place];  Service: Vascular;  Laterality: Left;   GIVENS CAPSULE STUDY  08/13/2011   Procedure: GIVENS CAPSULE STUDY;  Surgeon: Ladene Artist, MD,FACG;  Location: [facility] ENDOSCOPY;  Service: Endoscopy;  Laterality: N/A;   HAMMER TOE SURGERY Bilateral ~ 2000   HEMOSTASIS CLIP PLACEMENT  07/02/2019   Procedure: HEMOSTASIS CLIP PLACEMENT;  Surgeon: Rogene Houston, MD;  [facility];  Service: Endoscopy;;  hepatic flexure   HIATAL HERNIA REPAIR     HYDRADENITIS EXCISION  01/2011; 03/2012    'groin and abdomen; 03/2012" (09/14/2012)   HYDRADENITIS EXCISION  04/01/2012   Procedure: EXCISION HYDRADENITIS GROIN;  Surgeon: Pedro Earls, MD;  Location: [facility];  Service: General;  Laterality: Bilateral;  Excision of Hydradenitis of Perineum   HYDRADENITIS EXCISION N/A 09/17/2013   Procedure: EXCISION PERINEAL HIDRADENITIS ;  Surgeon: Pedro Earls, MD;  Location: [facility];  Service: General;  Laterality: N/A;  also in the pubis area   LEFT HEART CATH AND CORONARY ANGIOGRAPHY N/A 12/26/2016   Procedure: LEFT HEART CATH AND CORONARY ANGIOGRAPHY;  Surgeon: [name], Peter M, MD;  Location: [facility];  Service: Cardiovascular;  Laterality: N/A;   MASS EXCISION Right 09/17/2013   Procedure: EXCISION MASS;  Surgeon: Pedro Earls, MD;  Location: [facility];  Service: General;  Laterality: Right;   NISSEN FUNDOPLICATION  1157   PERIPHERAL VASCULAR CATHETERIZATION N/A 11/03/2014   Procedure: Lower Extremity Angiography;  Surgeon: Lorretta Harp, MD;  Location: [facility];  Service: Cardiovascular;  Laterality: N/A;   PERIPHERAL VASCULAR CATHETERIZATION N/A 01/29/2016   Procedure: Lower Extremity Angiography;  Surgeon: Lorretta Harp, MD;  Location: [facility] [facility];  Service: Cardiovascular;  Laterality: N/A;   POLYPECTOMY  07/02/2019   Procedure: POLYPECTOMY;  Surgeon: Rogene Houston, MD;  [facility];  Service: Endoscopy;;   POSTERIOR LUMBAR FUSION  2008 X 2   REDUCTION MAMMAPLASTY  1996?   TONSILLECTOMY AND ADENOIDECTOMY  1959 AND 2000   UVULOPALATOPHARYNGOPLASTY, TONSILLECTOMY AND SEPTOPLASTY  2000's   Social History:  reports that she has been smoking cigarettes. She has a 20.50 pack-year smoking history. She has never used smokeless tobacco. She reports that she does not drink alcohol and does not use drugs. Family History:  Family History  Problem Relation Age of Onset   Breast cancer Mother    Heart disease Mother    Hypertension Mother  Diabetes Father    Heart disease Father    Stroke Father    Heart disease Sister    Hypertension Sister    Hypertension Sister    Hyperlipidemia Sister    Diabetes Sister    Allergic rhinitis Sister    Emphysema Other        great uncle   Aneurysm Sister        brain   Colon cancer Paternal Uncle    Angioedema Neg Hx    Asthma Neg Hx    Eczema Neg Hx    Immunodeficiency Neg Hx    Urticaria Neg Hx      HOME MEDICATIONS: Allergies as of 03/19/2021       Reactions   Sulfa Antibiotics Shortness Of Breath, Palpitations   Codeine Other (See Comments)   Recovering Addict does not like to take Narcotics   Fish Allergy Hives, Swelling   Tongue swelling   Iodine Swelling   Metformin And Related Diarrhea   Soliqua [insulin Glargine-lixisenatide] Itching, Other (See Comments)   "tongue swelling"   Adhesive [tape] Rash   Paper tape is ok   Shellfish Allergy Swelling, Rash   Tongue swelling        Medication List        Accurate as of March 19, 2021  5:03 PM. If you have any questions, ask your nurse or doctor.          acetaminophen 325 MG tablet Commonly known as: TYLENOL Take 2 tablets (650 mg total) by mouth every 6 (six) hours as needed for mild pain (or Fever >/= 101).   albuterol 108 (90 Base) MCG/ACT inhaler Commonly known as: ProAir HFA 1-2 inhalations every 4-6 hours as needed for cough or wheeze. What changed:  how much to take how to take this when to take this reasons to take this additional instructions   ALPRAZolam 0.5 MG tablet Commonly known as: XANAX Take 0.5 mg by mouth 2 (two) times daily as needed for anxiety.   Azelastine HCl 0.15 % Soln Place 2 sprays into both nostrils 2 (two) times daily. What changed:  when to take this reasons to take this   bisacodyl 5 MG EC tablet Commonly known as: DULCOLAX Take 5 mg by mouth daily as needed for moderate constipation.   citalopram 10 MG tablet Commonly known as: CELEXA Take 10 mg by  mouth daily as needed.   citalopram 20 MG tablet Commonly known as: CELEXA Take 20 mg by mouth daily.   clopidogrel 75 MG tablet Commonly known as: PLAVIX Take 1 tablet (75 mg total) by mouth daily.   D3-50 1.25 MG (50000 UT) capsule Generic drug: Cholecalciferol Take 50,000 Units by mouth every 7 (seven) days.   Dexcom G6 Receiver Devi by Does not apply route.   doxycycline 100 MG capsule Commonly known as: VIBRAMYCIN Take 1 capsule (100 mg total) by mouth 2 (two) times daily.   empagliflozin 25 MG Tabs tablet Commonly known as: Jardiance Take 1 tablet (25 mg total) by mouth daily before breakfast.   EPINEPHrine 0.3 mg/0.3 mL Soaj injection Commonly known as: EPI-PEN Inject 0.3 mLs (0.3 mg total) into the muscle once.   Fluocinolone Acetonide Body 0.01 % Oil Apply topically 2 (two) times daily.   fluticasone 50 MCG/ACT nasal spray Commonly known as: FLONASE SPRAY 2 SPRAYS INTO EACH NOSTRIL EVERY DAY   insulin lispro 100 UNIT/ML injection Commonly known as: HUMALOG Max daily 100 units per OMNIPOD   levocetirizine 5 MG  tablet Commonly known as: XYZAL Take 5 mg by mouth daily.   losartan-hydrochlorothiazide 50-12.5 MG tablet Commonly known as: Hyzaar Take 1 tablet by mouth daily.   lubiprostone 24 MCG capsule Commonly known as: AMITIZA Take 1 capsule (24 mcg total) by mouth 2 (two) times daily with a meal.   Magnesium 200 MG Tabs Take 1 tablet (200 mg total) by mouth daily.   meloxicam 15 MG tablet Commonly known as: Mobic Take 1 tablet (15 mg total) by mouth daily. Toxic effects may be increased with concurrent administration of NSAIDs and Selective Serotonin Reuptake Inhibitors. The risk of upper gastrointestinal bleeding may be increased. Patients taking both drugs concurrently should be educated about the signs and symptoms of GI bleeding.   mupirocin ointment 2 % Commonly known as: BACTROBAN Apply 1 application topically 2 (two) times daily. What  changed:  when to take this reasons to take this   nitroGLYCERIN 0.4 MG SL tablet Commonly known as: NITROSTAT PLACE 1 TAB UNDER TONGUE EVERY 5 MINS AS NEEDED FOR CHEST PAIN - MAX 3 DOSES THEN 911   Omnipod DASH Pods (Gen 4) Misc CHANGE EVERY 72 HOURS   pantoprazole 40 MG tablet Commonly known as: PROTONIX TAKE 1 TABLET BY MOUTH EVERY DAY   polyethylene glycol 17 g packet Commonly known as: MIRALAX / GLYCOLAX Take 17 g by mouth daily as needed for moderate constipation or severe constipation.   potassium chloride 10 MEQ tablet Commonly known as: KLOR-CON Take 10 mEq by mouth daily.   propranolol ER 60 MG 24 hr capsule Commonly known as: INDERAL LA Take 1 capsule (60 mg total) by mouth every evening.   rosuvastatin 40 MG tablet Commonly known as: CRESTOR Take 1 tablet (40 mg total) by mouth every morning.   SUMAtriptan 100 MG tablet Commonly known as: IMITREX Take 100 mg by mouth every 2 (two) hours as needed for migraine.   temazepam 30 MG capsule Commonly known as: RESTORIL Take 30 mg by mouth at bedtime as needed for sleep.   traZODone 100 MG tablet Commonly known as: DESYREL Take 1 tablet (100 mg total) by mouth at bedtime.   Vitamin D (Ergocalciferol) 1.25 MG (50000 UNIT) Caps capsule Commonly known as: DRISDOL Take 50,000 Units by mouth every 7 (seven) days.         OBJECTIVE:   Vital Signs: BP 140/84 (BP Location: Left Arm, Patient Position: Sitting, Cuff Size: Normal)   Pulse 84   Ht 5\' 4"  (1.626 m)   Wt 141 lb 9.6 oz (64.2 kg)   SpO2 98%   BMI 24.31 kg/m   Wt Readings from Last 3 Encounters:  03/19/21 141 lb 9.6 oz (64.2 kg)  02/07/21 138 lb (62.6 kg)  02/06/21 140 lb (63.5 kg)     Exam: General: Pt appears well and is in NAD  Lungs: Clear with good BS bilat with no rales, rhonchi, or wheezes  Heart: RRR with normal S1 and S2 and no gallops; no murmurs; no rub  Extremities: No pretibial edema.  Neuro: MS is good with appropriate  affect, pt is alert and Ox3      DM foot exam: 03/19/2021   The skin of the feet is intact without sores or ulcerations. The pedal pulses are 1 + bilaterally  The sensation is intact to a screening 5.07, 10 gram monofilament bilaterally      DATA REVIEWED:  12/23/2020 A1c 8.4%  08/22/2020 BUN 12 Creatinine 0.780 TG 191 LDL 56  Glutamic Acid Decarb  Ab <5 IU/mL <5     ISLET CELL ANTIBODY SCREEN Negative  ASSESSMENT / PLAN / RECOMMENDATIONS:   1) Type 2 Diabetes Mellitus, with improving glycemic control, With neuropathic and macrovascular complications - Most recent A1c of 8.7 %. Goal A1c < 7.0 %.     -I have praised the patient on improved glycemic control with an A1c down from 11.3%, I have encouraged her to continue with lifestyle changes -Has Hx of pancreatitis in 2010, so DPP-4 inhibitors and GLp-1 agonists are  CONTRAINDICATED - GAD-65 and Islet cell Ab's negative  -She endorses hypoglycemia over night but non was seen on CGM download, she dies have hypoglycemia following a bolus, which will make the following adjustments   MEDICATIONS: Jardiance 25 mg daily  Humalog   Pump   Omnipod Settings   Insulin type   Humalog    Basal rate       0000 -0400 1.35 u/h    0400-1700 1.30   1700-000 1.40      I:C ratio       0000 1:15                  Sensitivity       0000  30      Goal       0000  120         EDUCATION / INSTRUCTIONS: BG monitoring instructions: Patient is instructed to check her blood sugars 4 times a day, before meals and bedtime . Call [facility] if: BG persistently < 70  I reviewed the Rule of 15 for the treatment of hypoglycemia in detail with the patient. Literature supplied.   2) Diabetic complications:  Eye: Does not have known diabetic retinopathy.  Neuro/ Feet: Does  have known diabetic peripheral neuropathy .  Renal: Patient does not have known baseline CKD.      F/U in 4 months    Signed  electronically by: Mack Guise, MD  [facility]  [facility] [address] [place], [place] [address] Phone: [phone] FAX: [phone]   CC: [name], MD [place] [address] Phone: [phone]  Fax: [phone]  Return to Endocrinology clinic as below: Future Appointments  Date Time Provider [place]  07/20/2021  1:20 PM Jyllian Haynie, Melanie Crazier, MD [facility] None

## 2021-03-20 DIAGNOSIS — E1165 Type 2 diabetes mellitus with hyperglycemia: Secondary | ICD-10-CM | POA: Diagnosis not present

## 2021-03-20 DIAGNOSIS — E1149 Type 2 diabetes mellitus with other diabetic neurological complication: Secondary | ICD-10-CM | POA: Diagnosis not present

## 2021-03-27 DIAGNOSIS — E1149 Type 2 diabetes mellitus with other diabetic neurological complication: Secondary | ICD-10-CM | POA: Diagnosis not present

## 2021-03-27 DIAGNOSIS — E1165 Type 2 diabetes mellitus with hyperglycemia: Secondary | ICD-10-CM | POA: Diagnosis not present

## 2021-03-27 DIAGNOSIS — Z794 Long term (current) use of insulin: Secondary | ICD-10-CM | POA: Diagnosis not present

## 2021-04-19 DIAGNOSIS — E1149 Type 2 diabetes mellitus with other diabetic neurological complication: Secondary | ICD-10-CM | POA: Diagnosis not present

## 2021-04-19 DIAGNOSIS — E1165 Type 2 diabetes mellitus with hyperglycemia: Secondary | ICD-10-CM | POA: Diagnosis not present

## 2021-04-25 ENCOUNTER — Other Ambulatory Visit: Payer: Self-pay | Admitting: Internal Medicine

## 2021-04-25 DIAGNOSIS — J309 Allergic rhinitis, unspecified: Secondary | ICD-10-CM | POA: Diagnosis not present

## 2021-05-20 DIAGNOSIS — E1165 Type 2 diabetes mellitus with hyperglycemia: Secondary | ICD-10-CM | POA: Diagnosis not present

## 2021-05-20 DIAGNOSIS — E1149 Type 2 diabetes mellitus with other diabetic neurological complication: Secondary | ICD-10-CM | POA: Diagnosis not present

## 2021-05-22 DIAGNOSIS — Z91013 Allergy to seafood: Secondary | ICD-10-CM | POA: Diagnosis not present

## 2021-05-22 DIAGNOSIS — H1045 Other chronic allergic conjunctivitis: Secondary | ICD-10-CM | POA: Diagnosis not present

## 2021-05-22 DIAGNOSIS — J301 Allergic rhinitis due to pollen: Secondary | ICD-10-CM | POA: Diagnosis not present

## 2021-05-22 DIAGNOSIS — J3089 Other allergic rhinitis: Secondary | ICD-10-CM | POA: Diagnosis not present

## 2021-05-22 DIAGNOSIS — R059 Cough, unspecified: Secondary | ICD-10-CM | POA: Diagnosis not present

## 2021-06-06 ENCOUNTER — Telehealth: Payer: Self-pay

## 2021-06-06 NOTE — Telephone Encounter (Signed)
Patient states that her blood sugar have been high.   06/05/21   2pm - over 400  6pm - 400   06/06/21  9am-268  11am- 344  12- 263   Patient states that she is taking medications as prescribed.    Tried to pull up patient pump an dexcom but last reading was 03/27/21.

## 2021-06-06 NOTE — Telephone Encounter (Signed)
Patient has been scheduled for Friday at 3:40pm.

## 2021-06-08 ENCOUNTER — Other Ambulatory Visit: Payer: Self-pay

## 2021-06-08 ENCOUNTER — Ambulatory Visit (INDEPENDENT_AMBULATORY_CARE_PROVIDER_SITE_OTHER): Payer: Medicare Other | Admitting: Internal Medicine

## 2021-06-08 ENCOUNTER — Encounter: Payer: Self-pay | Admitting: Internal Medicine

## 2021-06-08 VITALS — BP 120/74 | HR 82 | Ht 64.0 in | Wt 143.0 lb

## 2021-06-08 DIAGNOSIS — Z794 Long term (current) use of insulin: Secondary | ICD-10-CM

## 2021-06-08 DIAGNOSIS — E1142 Type 2 diabetes mellitus with diabetic polyneuropathy: Secondary | ICD-10-CM

## 2021-06-08 DIAGNOSIS — E1165 Type 2 diabetes mellitus with hyperglycemia: Secondary | ICD-10-CM | POA: Diagnosis not present

## 2021-06-08 DIAGNOSIS — E1159 Type 2 diabetes mellitus with other circulatory complications: Secondary | ICD-10-CM

## 2021-06-08 LAB — POCT GLYCOSYLATED HEMOGLOBIN (HGB A1C): Hemoglobin A1C: 9.2 % — AB (ref 4.0–5.6)

## 2021-06-08 MED ORDER — EMPAGLIFLOZIN 25 MG PO TABS: 25.0000 mg | ORAL_TABLET | Freq: Every day | ORAL | 3 refills | Status: DC

## 2021-06-08 NOTE — Patient Instructions (Signed)
° °  Pump   Omnipod Settings   Insulin type   Humalog    Basal rate       0000 -0400 1.35 u/h    0400-0800 1.30   0800-000 1.60      I:C ratio       0000 1:10                  Sensitivity       0000  25      Goal       0000  120

## 2021-06-08 NOTE — Progress Notes (Signed)
Name: [name]  Age/ Sex: 66 y.o., female   MRN/ DOB: [id number], 1955-10-30     PCP: [name], MD   Reason for Endocrinology Evaluation: Type 2 Diabetes Mellitus  Initial Endocrine Consultative Visit: 04/17/2020    PATIENT IDENTIFIER: [name] is a 66 y.o. female with a past medical history of T2DM, PAD ,CAD, HTN and Hx of pancreatitis  . The patient has followed with Endocrinology clinic since 04/17/2020 for consultative assistance with management of her diabetes.  DIABETIC HISTORY:  [name] was diagnosed with DM in 2003, She is intolerant to Metformin due to diarrhea and Soliqua due to itching . Her hemoglobin A1c has ranged from 7.1%  in 20, peaking at 9.5% in 2015  Has chronic GI issues - Dr Carlean Purl   Islet Ab's and GAD-65 negative   Jardiance started 07/2020 Omnipod started 08/2020   SUBJECTIVE:   During the last visit (03/19/2021):  A1c 8.7%, we adjusted pump setting and and continued Jardiance      Today (06/08/2021): [name] is here for diabetes follow up .  She checks her blood sugars multiple times a day through CGM. The patient has not had hypoglycemic episodes since the last clinic visit.  She had a fall and her hip is hurting She stopped smoking on New Year's Eve, but she has not changed her eating habits  This patient with type 2 diabetes is treated with Omnipod (insulin pump). During the visit the pump basal and bolus doses were reviewed including carb/insulin rations and supplemental doses. The clinical list was updated. The glucose meter download was reviewed in detail to determine if the current pump settings are providing the best glycemic control without excessive hypoglycemia.     Pump and meter download:    Pump   Omnipod Settings   Insulin type   Humalog    Basal rate       0000 -0400 1.35 u/h    0400-1700 1.30   1700-000 1.40      I:C ratio       0000 1:15                  Sensitivity       0000  30       Goal       0000  120         Type & Model of Pump: Omnipod Insulin Type: Currently using Humalog .   PUMP STATISTICS: Average BG: 260 Average Daily Carbs (g): 98.1 Average Total Daily Insulin: 45.1 Average Daily Basal: 27.1 (60 %) Average Daily Bolus: 18 (40%)  Overrides 0% Bolus per day 2.7    CONTINUOUS GLUCOSE MONITORING RECORD INTERPRETATION    Dates of Recording: 1/21-06/08/21  Sensor description: dexcom  Results statistics:   CGM use % of time 86  Average and SD 240/52  Time in range  11 %  % Time Above 180 48  % Time above 250 41  % Time Below target 0    Glycemic patterns summary: hyperglycemia noted overnight, and during the day  Hyperglycemic episodes  during the night and postprandial    Hypoglycemic episodes occurred n/a  Overnight periods: variable         HOME DIABETES REGIMEN:  Humalog Jardiance 25 mg daily     Statin: yes ACE-I/ARB: yes      DIABETIC COMPLICATIONS: Microvascular complications:  Neuropathy  Denies: CKD, retinopathy Last Eye Exam: Completed 12/26/2020  Macrovascular complications:  PVD (  S/P femoral-popliteal bypass), Mild CAD  Denies: CAD, CVA   HISTORY:  Past Medical History:  Past Medical History:  Diagnosis Date   Allergic urticaria 07/10/2015   Allergy    Anemia    hx   Angioedema 07/10/2015   Anxiety    Arthritis    "neck, left hand" (09/14/2012)   Asthma    Cancer ([place]) 1985   ovarian, no treatment except surgery   Cataract    Complication of anesthesia    [place] TO RIGHT   Critical lower limb ischemia ([place])    10/2014 s/p L SFA stenting   Diverticulosis of colon with hemorrhage April 2013   GERD (gastroesophageal reflux disease)    GI bleed    H/O hiatal hernia    Heart attack ([place])    2003 mild MI, March 2013 mild MI   Hidradenitis    groin   History of blood transfusion 1985 AND 2013   Hyperlipidemia    Hypertension    Irritable bowel syndrome     Left-sided weakness    since stroke, left eye trouble seeing   Migraines    Mild CAD    a. Cath 09/2010: mild luminal irregularities of LAD, 30% prox RCA and 20-30% mRCA, EF 65%.   Neuropathy    Obesity    Osteoporosis    PAD (peripheral artery disease) ([place])    a. critical limb ischemia s/p PTA/stenting of L SFA 10/2014. c. occ prior SFA stent by angio 01/2016, for possible PV bypass.   Pneumonia    baby   Recurrent upper respiratory infection (URI)    S/P arterial stent-mid Lt SFA 11/03/14 11/04/2014   Schatzki's ring    Sinus problem    Stomach ulcer 1972   non-bleeding   Stroke ([place]) 07-2007, 07-2008, 07-2009   total 3 strokes; mild left sided weakness and left eye "jumps".   Type II diabetes mellitus ([place])    Vitamin B 12 deficiency 07-15-2013   Past Surgical History:  Past Surgical History:  Procedure Laterality Date   ABDOMINAL HYSTERECTOMY  1985   ADENOIDECTOMY     ANTERIOR CERVICAL DECOMP/DISCECTOMY FUSION  2002   ANTERIOR CERVICAL DECOMP/DISCECTOMY FUSION N/A 04/08/2014   Procedure: Cervical Six-Seven ANTERIOR CERVICAL DECOMPRESSION/DISCECTOMY FUSION Plating and Bonegraft  2 LEVELS;  Surgeon: Ashok Pall, MD;  Location: [facility];  Service: Neurosurgery;  Laterality: N/A;  Cervical Six-Seven ANTERIOR CERVICAL DECOMPRESSION/DISCECTOMY FUSION Plating and Bonegraft  2 LEVELS   APPENDECTOMY  1985   AXILLARY HIDRADENITIS EXCISION  1990-2008   bilateral   BACK SURGERY     BREAST BIOPSY Right 2007   BREAST CYST EXCISION Right 2008   BREAST REDUCTION SURGERY     CARDIAC CATHETERIZATION  2004   mild disease   CATARACT EXTRACTION W/PHACO Left 05/16/2015   Procedure: CATARACT EXTRACTION PHACO AND INTRAOCULAR LENS PLACEMENT (IOC);  Surgeon: Rutherford Guys, MD;  Location: [facility];  Service: Ophthalmology;  Laterality: Left;  CDE: 4.24   CATARACT EXTRACTION W/PHACO Right 05/30/2015   Procedure: CATARACT EXTRACTION RIGHT EYE PHACO AND INTRAOCULAR LENS PLACEMENT ;  Surgeon: Rutherford Guys,  MD;  Location: [facility];  Service: Ophthalmology;  Laterality: Right;  CDE:4.08   CHOLECYSTECTOMY  1990's   COLONOSCOPY  08/12/2011   Procedure: COLONOSCOPY;  Surgeon: Ladene Artist, MD,FACG;  Location: [facility] ENDOSCOPY;  Service: Endoscopy;  Laterality: N/A;   COLONOSCOPY  06/19/2006   COLONOSCOPY WITH PROPOFOL N/A 07/02/2019   Procedure: COLONOSCOPY WITH PROPOFOL;  Surgeon: Laural Golden,  Mechele Dawley, MD;  [facility];  Service: Endoscopy;  Laterality: N/A;   cyst thigh Right    ESOPHAGOGASTRODUODENOSCOPY  08/12/2011   Procedure: ESOPHAGOGASTRODUODENOSCOPY (EGD);  Surgeon: Ladene Artist, MD,FACG;  Location: [facility] ENDOSCOPY;  Service: Endoscopy;  Laterality: N/A;   ESOPHAGOGASTRODUODENOSCOPY  06/04/2005   ESOPHAGOGASTRODUODENOSCOPY (EGD) WITH PROPOFOL N/A 07/01/2019   Procedure: ESOPHAGOGASTRODUODENOSCOPY (EGD) WITH PROPOFOL;  Surgeon: Rogene [place], MD;  [facility];  Service: Endoscopy;  Laterality: N/A;   FEMORAL-POPLITEAL BYPASS GRAFT Left 06/19/2016   Procedure: Left Leg BYPASS GRAFT FEMORAL-POPLITEAL ARTERY;  Surgeon: Serafina Mitchell, MD;  Location: [facility];  Service: Vascular;  Laterality: Left;   GIVENS CAPSULE STUDY  08/13/2011   Procedure: GIVENS CAPSULE STUDY;  Surgeon: Ladene Artist, MD,FACG;  Location: [facility] ENDOSCOPY;  Service: Endoscopy;  Laterality: N/A;   HAMMER TOE SURGERY Bilateral ~ 2000   HEMOSTASIS CLIP PLACEMENT  07/02/2019   Procedure: HEMOSTASIS CLIP PLACEMENT;  Surgeon: Rogene [place], MD;  [facility];  Service: Endoscopy;;  hepatic flexure   HIATAL HERNIA REPAIR     HYDRADENITIS EXCISION  01/2011; 03/2012   'groin and abdomen; 03/2012" (09/14/2012)   HYDRADENITIS EXCISION  04/01/2012   Procedure: EXCISION HYDRADENITIS GROIN;  Surgeon: Pedro Earls, MD;  Location: [facility];  Service: General;  Laterality: Bilateral;  Excision of Hydradenitis of Perineum   HYDRADENITIS EXCISION N/A 09/17/2013   Procedure: EXCISION PERINEAL HIDRADENITIS ;  Surgeon:  Pedro Earls, MD;  Location: [facility];  Service: General;  Laterality: N/A;  also in the pubis area   LEFT HEART CATH AND CORONARY ANGIOGRAPHY N/A 12/26/2016   Procedure: LEFT HEART CATH AND CORONARY ANGIOGRAPHY;  Surgeon: [name], Peter M, MD;  Location: [facility] CV LAB;  Service: Cardiovascular;  Laterality: N/A;   MASS EXCISION Right 09/17/2013   Procedure: EXCISION MASS;  Surgeon: Pedro Earls, MD;  Location: [facility];  Service: General;  Laterality: Right;   NISSEN FUNDOPLICATION  9371   PERIPHERAL VASCULAR CATHETERIZATION N/A 11/03/2014   Procedure: Lower Extremity Angiography;  Surgeon: Lorretta Harp, MD;  Location: [facility] CV LAB;  Service: Cardiovascular;  Laterality: N/A;   PERIPHERAL VASCULAR CATHETERIZATION N/A 01/29/2016   Procedure: Lower Extremity Angiography;  Surgeon: Lorretta Harp, MD;  Location: [facility] CV LAB;  Service: Cardiovascular;  Laterality: N/A;   POLYPECTOMY  07/02/2019   Procedure: POLYPECTOMY;  Surgeon: Rogene [place], MD;  [facility];  Service: Endoscopy;;   POSTERIOR LUMBAR FUSION  2008 X 2   REDUCTION MAMMAPLASTY  1996?   TONSILLECTOMY AND ADENOIDECTOMY  1959 AND 2000   UVULOPALATOPHARYNGOPLASTY, TONSILLECTOMY AND SEPTOPLASTY  2000's   Social History:  reports that she has been smoking cigarettes. She has a 20.50 pack-year smoking history. She has never used smokeless tobacco. She reports that she does not drink alcohol and does not use drugs. Family History:  Family History  Problem Relation Age of Onset   Breast cancer Mother    Heart disease Mother    Hypertension Mother    Diabetes Father    Heart disease Father    Stroke Father    Heart disease Sister    Hypertension Sister    Hypertension Sister    Hyperlipidemia Sister    Diabetes Sister    Allergic rhinitis Sister    Emphysema Other        great uncle   Aneurysm Sister        brain  Colon cancer Paternal Uncle    Angioedema Neg Hx    Asthma Neg Hx     Eczema Neg Hx    Immunodeficiency Neg Hx    Urticaria Neg Hx      HOME MEDICATIONS: Allergies as of 06/08/2021       Reactions   Sulfa Antibiotics Shortness Of Breath, Palpitations   Codeine Other (See Comments)   Recovering Addict does not like to take Narcotics   Fish Allergy Hives, Swelling   Tongue swelling   Iodine Swelling   Metformin And Related Diarrhea   Soliqua [insulin Glargine-lixisenatide] Itching, Other (See Comments)   "tongue swelling"   Adhesive [tape] Rash   Paper tape is ok   Shellfish Allergy Swelling, Rash   Tongue swelling        Medication List        Accurate as of June 08, 2021  4:35 PM. If you have any questions, ask your nurse or doctor.          acetaminophen 325 MG tablet Commonly known as: TYLENOL Take 2 tablets (650 mg total) by mouth every 6 (six) hours as needed for mild pain (or Fever >/= 101).   albuterol 108 (90 Base) MCG/ACT inhaler Commonly known as: ProAir HFA 1-2 inhalations every 4-6 hours as needed for cough or wheeze. What changed:  how much to take how to take this when to take this reasons to take this additional instructions   ALPRAZolam 0.5 MG tablet Commonly known as: XANAX Take 0.5 mg by mouth 2 (two) times daily as needed for anxiety.   Azelastine HCl 0.15 % Soln Place 2 sprays into both nostrils 2 (two) times daily. What changed:  when to take this reasons to take this   bisacodyl 5 MG EC tablet Commonly known as: DULCOLAX Take 5 mg by mouth daily as needed for moderate constipation.   citalopram 10 MG tablet Commonly known as: CELEXA Take 10 mg by mouth daily as needed.   citalopram 20 MG tablet Commonly known as: CELEXA Take 20 mg by mouth daily.   clopidogrel 75 MG tablet Commonly known as: PLAVIX Take 1 tablet (75 mg total) by mouth daily.   D3-50 1.25 MG (50000 UT) capsule Generic drug: Cholecalciferol Take 50,000 Units by mouth every 7 (seven) days.   Dexcom G6 Receiver Devi by  Does not apply route.   doxycycline 100 MG capsule Commonly known as: VIBRAMYCIN Take 1 capsule (100 mg total) by mouth 2 (two) times daily.   empagliflozin 25 MG Tabs tablet Commonly known as: Jardiance Take 1 tablet (25 mg total) by mouth daily before breakfast.   EPINEPHrine 0.3 mg/0.3 mL Soaj injection Commonly known as: EPI-PEN Inject 0.3 mLs (0.3 mg total) into the muscle once.   Fluocinolone Acetonide Body 0.01 % Oil Apply topically 2 (two) times daily.   fluticasone 50 MCG/ACT nasal spray Commonly known as: FLONASE SPRAY 2 SPRAYS INTO EACH NOSTRIL EVERY DAY   insulin lispro 100 UNIT/ML injection Commonly known as: HUMALOG Max daily 100 units per OMNIPOD   levocetirizine 5 MG tablet Commonly known as: XYZAL Take 5 mg by mouth daily.   losartan-hydrochlorothiazide 50-12.5 MG tablet Commonly known as: Hyzaar Take 1 tablet by mouth daily.   lubiprostone 24 MCG capsule Commonly known as: AMITIZA Take 1 capsule (24 mcg total) by mouth 2 (two) times daily with a meal.   Magnesium 200 MG Tabs Take 1 tablet (200 mg total) by mouth daily.   meloxicam 15 MG  tablet Commonly known as: Mobic Take 1 tablet (15 mg total) by mouth daily. Toxic effects may be increased with concurrent administration of NSAIDs and Selective Serotonin Reuptake Inhibitors. The risk of upper gastrointestinal bleeding may be increased. Patients taking both drugs concurrently should be educated about the signs and symptoms of GI bleeding.   mupirocin ointment 2 % Commonly known as: BACTROBAN Apply 1 application topically 2 (two) times daily. What changed:  when to take this reasons to take this   nitroGLYCERIN 0.4 MG SL tablet Commonly known as: NITROSTAT PLACE 1 TAB UNDER TONGUE EVERY 5 MINS AS NEEDED FOR CHEST PAIN - MAX 3 DOSES THEN 911   Omnipod DASH Pods (Gen 4) Misc CHANGE EVERY 72 HOURS   pantoprazole 40 MG tablet Commonly known as: PROTONIX TAKE 1 TABLET BY MOUTH EVERY DAY    polyethylene glycol 17 g packet Commonly known as: MIRALAX / GLYCOLAX Take 17 g by mouth daily as needed for moderate constipation or severe constipation.   potassium chloride 10 MEQ tablet Commonly known as: KLOR-CON Take 10 mEq by mouth daily.   propranolol ER 60 MG 24 hr capsule Commonly known as: INDERAL LA Take 1 capsule (60 mg total) by mouth every evening.   rosuvastatin 40 MG tablet Commonly known as: CRESTOR Take 1 tablet (40 mg total) by mouth every morning.   SUMAtriptan 100 MG tablet Commonly known as: IMITREX Take 100 mg by mouth every 2 (two) hours as needed for migraine.   temazepam 30 MG capsule Commonly known as: RESTORIL Take 30 mg by mouth at bedtime as needed for sleep.   traZODone 100 MG tablet Commonly known as: DESYREL Take 1 tablet (100 mg total) by mouth at bedtime.   Vitamin D (Ergocalciferol) 1.25 MG (50000 UNIT) Caps capsule Commonly known as: DRISDOL Take 50,000 Units by mouth every 7 (seven) days.         OBJECTIVE:   Vital Signs: BP 120/74 (BP Location: Left Arm, Patient Position: Sitting, Cuff Size: Small)    Pulse 82    Ht 5\' 4"  (1.626 m)    Wt 143 lb (64.9 kg)    SpO2 99%    BMI 24.55 kg/m   Wt Readings from Last 3 Encounters:  06/08/21 143 lb (64.9 kg)  03/19/21 141 lb 9.6 oz (64.2 kg)  02/07/21 138 lb (62.6 kg)     Exam: General: Pt appears well and is in NAD  Lungs: Clear with good BS bilat with no rales, rhonchi, or wheezes  Heart: RRR with normal S1 and S2 and no gallops; no murmurs; no rub  Extremities: No pretibial edema.  Neuro: MS is good with appropriate affect, pt is alert and Ox3      DM foot exam: 03/19/2021   The skin of the feet is intact without sores or ulcerations. The pedal pulses are 1 + bilaterally  The sensation is intact to a screening 5.07, 10 gram monofilament bilaterally      DATA REVIEWED:  12/23/2020 A1c 8.4%  08/22/2020 BUN 12 Creatinine 0.780 TG 191 LDL 56  Glutamic Acid Decarb  Ab <5 IU/mL <5     ISLET CELL ANTIBODY SCREEN Negative  ASSESSMENT / PLAN / RECOMMENDATIONS:   1) Type 2 Diabetes Mellitus, with poorly controlled, with neuropathic and macrovascular complications - Most recent A1c of 9.2 %. Goal A1c < 7.0 %.     -The patient has been noted with hyperglycemia and worsening diabetes control with an A1c up from 8.7% -Has Hx  of pancreatitis in 2010, so DPP-4 inhibitors and GLp-1 agonists are  CONTRAINDICATED - GAD-65 and Islet cell Ab's negative  -I am going to increase her basal rate during the day, adjust her insulin to carb ratio as well as sensitivity factor    MEDICATIONS: Jardiance 25 mg daily  Humalog   Pump   Omnipod Settings   Insulin type   Humalog    Basal rate       0000 -0400 1.35 u/h    0400-0800 1.30   0800-000 1.60      I:C ratio       0000 1:10                  Sensitivity       0000  25      Goal       0000  120         EDUCATION / INSTRUCTIONS: BG monitoring instructions: Patient is instructed to check her blood sugars 4 times a day, before meals and bedtime . Call [facility] if: BG persistently < 70  I reviewed the Rule of 15 for the treatment of hypoglycemia in detail with the patient. Literature supplied.   2) Diabetic complications:  Eye: Does not have known diabetic retinopathy.  Neuro/ Feet: Does  have known diabetic peripheral neuropathy .  Renal: Patient does not have known baseline CKD.      F/U in 4 months    Signed electronically by: Mack Guise, MD  [facility]  [facility] [address] [place],  [address] Phone: [phone] FAX: [phone]   CC: [name], MD [place] [address] Phone: [phone]  Fax: [phone]  Return to Endocrinology clinic as below: Future Appointments  Date Time Provider [place]  07/20/2021  1:20 PM Latonia Conrow, Melanie Crazier, MD [facility]  None

## 2021-06-14 ENCOUNTER — Telehealth: Payer: Self-pay

## 2021-06-14 NOTE — Telephone Encounter (Signed)
Vm left for patient to call back regarding sugars. Patient also advised that she could send a [employer].

## 2021-06-15 ENCOUNTER — Telehealth: Payer: Self-pay | Admitting: Internal Medicine

## 2021-06-15 ENCOUNTER — Encounter: Payer: Self-pay | Admitting: Internal Medicine

## 2021-06-15 ENCOUNTER — Ambulatory Visit (INDEPENDENT_AMBULATORY_CARE_PROVIDER_SITE_OTHER): Payer: Medicare Other | Admitting: Internal Medicine

## 2021-06-15 ENCOUNTER — Ambulatory Visit: Payer: Medicare Other | Admitting: Internal Medicine

## 2021-06-15 ENCOUNTER — Other Ambulatory Visit: Payer: Self-pay

## 2021-06-15 VITALS — BP 138/80 | HR 94 | Ht 64.0 in | Wt 141.0 lb

## 2021-06-15 DIAGNOSIS — M25551 Pain in right hip: Secondary | ICD-10-CM | POA: Diagnosis not present

## 2021-06-15 DIAGNOSIS — E1165 Type 2 diabetes mellitus with hyperglycemia: Secondary | ICD-10-CM

## 2021-06-15 DIAGNOSIS — E785 Hyperlipidemia, unspecified: Secondary | ICD-10-CM

## 2021-06-15 DIAGNOSIS — E1142 Type 2 diabetes mellitus with diabetic polyneuropathy: Secondary | ICD-10-CM | POA: Diagnosis not present

## 2021-06-15 DIAGNOSIS — M25552 Pain in left hip: Secondary | ICD-10-CM | POA: Diagnosis not present

## 2021-06-15 DIAGNOSIS — Z794 Long term (current) use of insulin: Secondary | ICD-10-CM

## 2021-06-15 DIAGNOSIS — E1159 Type 2 diabetes mellitus with other circulatory complications: Secondary | ICD-10-CM | POA: Diagnosis not present

## 2021-06-15 LAB — POCT GLYCOSYLATED HEMOGLOBIN (HGB A1C): Hemoglobin A1C: 9.8 % — AB (ref 4.0–5.6)

## 2021-06-15 MED ORDER — OMNIPOD 5 DEXG7G6 INTRO GEN 5 KIT: 1.0000 | PACK | 0 refills | Status: DC

## 2021-06-15 MED ORDER — OMNIPOD 5 DEXG7G6 PODS GEN 5 MISC: 1.0000 | 3 refills | Status: DC

## 2021-06-15 NOTE — Patient Instructions (Signed)
° °  Pump   Omnipod Settings   Insulin type   Humalog    Basal rate       0000 -0400 1.60 u/h    0400-0800 1.60   0800-000 2.0      I:C ratio       0000 1:6                  Sensitivity       0000  15      Goal       0000  120

## 2021-06-15 NOTE — Telephone Encounter (Signed)
Pt came in to see if someone could look at her Dexcom G6 meter/Omnipod stating that her readings are going up and down 360 at 3ish a.m., 400 4ish a.m. and she took about 9 units, 162 @ 9:00 a.m. stated that it was low for her and went back up to 300 at 10:30 a.m.  Pt stated that her diet has not changed.  Pt would like to see if someone could give her a call back [phone] and to advise her as if they can help her with the meter.

## 2021-06-15 NOTE — Progress Notes (Deleted)
c 

## 2021-06-15 NOTE — Progress Notes (Signed)
Name: [name]  Age/ Sex: 66 y.o., female   MRN/ DOB: [id number], May 19, 1955     PCP: [name], MD   Reason for Endocrinology Evaluation: Type 2 Diabetes Mellitus  Initial Endocrine Consultative Visit: 04/17/2020    PATIENT IDENTIFIER: [name] is a 66 y.o. female with a past medical history of T2DM, PAD ,CAD, HTN and Hx of pancreatitis  . The patient has followed with Endocrinology clinic since 04/17/2020 for consultative assistance with management of her diabetes.  DIABETIC HISTORY:  [name] was diagnosed with DM in 2003, She is intolerant to Metformin due to diarrhea and Soliqua due to itching . Her hemoglobin A1c has ranged from 7.1%  in 20, peaking at 9.5% in 2015  Has chronic GI issues - Dr Carlean Purl   Islet Ab's and GAD-65 negative   Jardiance started 07/2020 Omnipod started 08/2020   SUBJECTIVE:   During the last visit (06/08/2021) :  A1c 8.7%, we adjusted pump setting and and continued Jardiance      Today (06/15/2021): [name] is here for diabetes follow up .  She checks her blood sugars multiple times a day through CGM. The patient has not had hypoglycemic episodes since the last clinic visit.   Has been having constipation the past 2 days  Has occasional heartburn  Received a left hip injection    This patient with type 2 diabetes is treated with Omnipod (insulin pump). During the visit the pump basal and bolus doses were reviewed including carb/insulin rations and supplemental doses. The clinical list was updated. The glucose meter download was reviewed in detail to determine if the current pump settings are providing the best glycemic control without excessive hypoglycemia.     Pump and meter download:    Pump   Omnipod Settings   Insulin type   Humalog    Basal rate       0000 -0400 1.35 u/h    0400-1700 1.30   1700-000 1.60      I:C ratio       0000 1:10                  Sensitivity       0000  25      Goal        0000  120         Type & Model of Pump: Omnipod Insulin Type: Currently using Humalog .   PUMP STATISTICS: Average BG: 260 Average Daily Carbs (g): 106.6 Average Total Daily Insulin: 55 Average Daily Basal: 31.6(57 %) Average Daily Bolus: 23.4(43%)  Overrides 0% Bolus per day 3    CONTINUOUS GLUCOSE MONITORING RECORD INTERPRETATION    Dates of Recording: 1/28-2/02/2022  Sensor description: dexcom  Results statistics:   CGM use % of time 86  Average and SD 244/63  Time in range  16 %  % Time Above 180 41  % Time above 250 43  % Time Below target 0    Glycemic patterns summary: hyperglycemia noted overnight, and during the day  Hyperglycemic episodes  during the night and postprandial    Hypoglycemic episodes occurred n/a  Overnight periods: variable         HOME DIABETES REGIMEN:  Humalog Jardiance 25 mg daily     Statin: yes ACE-I/ARB: yes      DIABETIC COMPLICATIONS: Microvascular complications:  Neuropathy  Denies: CKD, retinopathy Last Eye Exam: Completed 12/26/2020  Macrovascular complications:  PVD ( S/P femoral-popliteal bypass),  Mild CAD  Denies: CAD, CVA   HISTORY:  Past Medical History:  Past Medical History:  Diagnosis Date   Allergic urticaria 07/10/2015   Allergy    Anemia    hx   Angioedema 07/10/2015   Anxiety    Arthritis    "neck, left hand" (09/14/2012)   Asthma    Cancer ([place]) 1985   ovarian, no treatment except surgery   Cataract    Complication of anesthesia    [place] TO RIGHT   Critical lower limb ischemia ([place])    10/2014 s/p L SFA stenting   Diverticulosis of colon with hemorrhage April 2013   GERD (gastroesophageal reflux disease)    GI bleed    H/O hiatal hernia    Heart attack ([place])    2003 mild MI, March 2013 mild MI   Hidradenitis    groin   History of blood transfusion 1985 AND 2013   Hyperlipidemia    Hypertension    Irritable bowel syndrome    Left-sided  weakness    since stroke, left eye trouble seeing   Migraines    Mild CAD    a. Cath 09/2010: mild luminal irregularities of LAD, 30% prox RCA and 20-30% mRCA, EF 65%.   Neuropathy    Obesity    Osteoporosis    PAD (peripheral artery disease) ([place])    a. critical limb ischemia s/p PTA/stenting of L SFA 10/2014. c. occ prior SFA stent by angio 01/2016, for possible PV bypass.   Pneumonia    baby   Recurrent upper respiratory infection (URI)    S/P arterial stent-mid Lt SFA 11/03/14 11/04/2014   Schatzki's ring    Sinus problem    Stomach ulcer 1972   non-bleeding   Stroke ([place]) 07-2007, 07-2008, 07-2009   total 3 strokes; mild left sided weakness and left eye "jumps".   Type II diabetes mellitus ([place])    Vitamin B 12 deficiency 07-15-2013   Past Surgical History:  Past Surgical History:  Procedure Laterality Date   ABDOMINAL HYSTERECTOMY  1985   ADENOIDECTOMY     ANTERIOR CERVICAL DECOMP/DISCECTOMY FUSION  2002   ANTERIOR CERVICAL DECOMP/DISCECTOMY FUSION N/A 04/08/2014   Procedure: Cervical Six-Seven ANTERIOR CERVICAL DECOMPRESSION/DISCECTOMY FUSION Plating and Bonegraft  2 LEVELS;  Surgeon: Ashok Pall, MD;  Location: [facility];  Service: Neurosurgery;  Laterality: N/A;  Cervical Six-Seven ANTERIOR CERVICAL DECOMPRESSION/DISCECTOMY FUSION Plating and Bonegraft  2 LEVELS   APPENDECTOMY  1985   AXILLARY HIDRADENITIS EXCISION  1990-2008   bilateral   BACK SURGERY     BREAST BIOPSY Right 2007   BREAST CYST EXCISION Right 2008   BREAST REDUCTION SURGERY     CARDIAC CATHETERIZATION  2004   mild disease   CATARACT EXTRACTION W/PHACO Left 05/16/2015   Procedure: CATARACT EXTRACTION PHACO AND INTRAOCULAR LENS PLACEMENT (IOC);  Surgeon: Rutherford Guys, MD;  Location: [facility];  Service: Ophthalmology;  Laterality: Left;  CDE: 4.24   CATARACT EXTRACTION W/PHACO Right 05/30/2015   Procedure: CATARACT EXTRACTION RIGHT EYE PHACO AND INTRAOCULAR LENS PLACEMENT ;  Surgeon: Rutherford Guys, MD;   Location: [facility];  Service: Ophthalmology;  Laterality: Right;  CDE:4.08   CHOLECYSTECTOMY  1990's   COLONOSCOPY  08/12/2011   Procedure: COLONOSCOPY;  Surgeon: Ladene Artist, MD,FACG;  Location: [facility]OS[facility];  Service: Endoscopy;  Laterality: N/A;   COLONOSCOPY  06/19/2006   COLONOSCOPY WITH PROPOFOL N/A 07/02/2019   Procedure: COLONOSCOPY WITH PROPOFOL;  Surgeon: Rogene Houston, MD;  [facility];  Service: Endoscopy;  Laterality: N/A;   cyst thigh Right    ESOPHAGOGASTRODUODENOSCOPY  08/12/2011   Procedure: ESOPHAGOGASTRODUODENOSCOPY (EGD);  Surgeon: Ladene Artist, MD,FACG;  Location: [facility] ENDOSCOPY;  Service: Endoscopy;  Laterality: N/A;   ESOPHAGOGASTRODUODENOSCOPY  06/04/2005   ESOPHAGOGASTRODUODENOSCOPY (EGD) WITH PROPOFOL N/A 07/01/2019   Procedure: ESOPHAGOGASTRODUODENOSCOPY (EGD) WITH PROPOFOL;  Surgeon: Rogene Houston, MD;  [facility];  Service: Endoscopy;  Laterality: N/A;   FEMORAL-POPLITEAL BYPASS GRAFT Left 06/19/2016   Procedure: Left Leg BYPASS GRAFT FEMORAL-POPLITEAL ARTERY;  Surgeon: Serafina Mitchell, MD;  Location: [facility];  Service: Vascular;  Laterality: Left;   GIVENS CAPSULE STUDY  08/13/2011   Procedure: GIVENS CAPSULE STUDY;  Surgeon: Ladene Artist, MD,FACG;  Location: [facility] ENDOSCOPY;  Service: Endoscopy;  Laterality: N/A;   HAMMER TOE SURGERY Bilateral ~ 2000   HEMOSTASIS CLIP PLACEMENT  07/02/2019   Procedure: HEMOSTASIS CLIP PLACEMENT;  Surgeon: Rogene Houston, MD;  [facility];  Service: Endoscopy;;  hepatic flexure   HIATAL HERNIA REPAIR     HYDRADENITIS EXCISION  01/2011; 03/2012   'groin and abdomen; 03/2012" (09/14/2012)   HYDRADENITIS EXCISION  04/01/2012   Procedure: EXCISION HYDRADENITIS GROIN;  Surgeon: Pedro Earls, MD;  Location: [facility];  Service: General;  Laterality: Bilateral;  Excision of Hydradenitis of Perineum   HYDRADENITIS EXCISION N/A 09/17/2013   Procedure: EXCISION PERINEAL HIDRADENITIS ;  Surgeon: Pedro Earls, MD;  Location: [facility];  Service: General;  Laterality: N/A;  also in the pubis area   LEFT HEART CATH AND CORONARY ANGIOGRAPHY N/A 12/26/2016   Procedure: LEFT HEART CATH AND CORONARY ANGIOGRAPHY;  Surgeon: [name], Peter M, MD;  Location: [facility];  Service: Cardiovascular;  Laterality: N/A;   MASS EXCISION Right 09/17/2013   Procedure: EXCISION MASS;  Surgeon: Pedro Earls, MD;  Location: [facility];  Service: General;  Laterality: Right;   NISSEN FUNDOPLICATION  3785   PERIPHERAL VASCULAR CATHETERIZATION N/A 11/03/2014   Procedure: Lower Extremity Angiography;  Surgeon: Lorretta Harp, MD;  Location: [facility];  Service: Cardiovascular;  Laterality: N/A;   PERIPHERAL VASCULAR CATHETERIZATION N/A 01/29/2016   Procedure: Lower Extremity Angiography;  Surgeon: Lorretta Harp, MD;  Location: [facility];  Service: Cardiovascular;  Laterality: N/A;   POLYPECTOMY  07/02/2019   Procedure: POLYPECTOMY;  Surgeon: Rogene Houston, MD;  [facility];  Service: Endoscopy;;   POSTERIOR LUMBAR FUSION  2008 X 2   REDUCTION MAMMAPLASTY  1996?   TONSILLECTOMY AND ADENOIDECTOMY  1959 AND 2000   UVULOPALATOPHARYNGOPLASTY, TONSILLECTOMY AND SEPTOPLASTY  2000's   Social History:  reports that she has been smoking cigarettes. She has a 20.50 pack-year smoking history. She has never used smokeless tobacco. She reports that she does not drink alcohol and does not use drugs. Family History:  Family History  Problem Relation Age of Onset   Breast cancer Mother    Heart disease Mother    Hypertension Mother    Diabetes Father    Heart disease Father    Stroke Father    Heart disease Sister    Hypertension Sister    Hypertension Sister    Hyperlipidemia Sister    Diabetes Sister    Allergic rhinitis Sister    Emphysema Other        great uncle   Aneurysm Sister        brain   Colon cancer Paternal  Uncle    Angioedema Neg Hx    Asthma Neg Hx    Eczema Neg Hx     Immunodeficiency Neg Hx    Urticaria Neg Hx      HOME MEDICATIONS: Allergies as of 06/15/2021       Reactions   Sulfa Antibiotics Shortness Of Breath, Palpitations   Codeine Other (See Comments)   Recovering Addict does not like to take Narcotics   Fish Allergy Hives, Swelling   Tongue swelling   Iodine Swelling   Metformin And Related Diarrhea   Soliqua [insulin Glargine-lixisenatide] Itching, Other (See Comments)   "tongue swelling"   Adhesive [tape] Rash   Paper tape is ok   Shellfish Allergy Swelling, Rash   Tongue swelling        Medication List        Accurate as of June 15, 2021  3:33 PM. If you have any questions, ask your nurse or doctor.          acetaminophen 325 MG tablet Commonly known as: TYLENOL Take 2 tablets (650 mg total) by mouth every 6 (six) hours as needed for mild pain (or Fever >/= 101).   albuterol 108 (90 Base) MCG/ACT inhaler Commonly known as: ProAir HFA 1-2 inhalations every 4-6 hours as needed for cough or wheeze. What changed:  how much to take how to take this when to take this reasons to take this additional instructions   ALPRAZolam 0.5 MG tablet Commonly known as: XANAX Take 0.5 mg by mouth 2 (two) times daily as needed for anxiety.   Azelastine HCl 0.15 % Soln Place 2 sprays into both nostrils 2 (two) times daily. What changed:  when to take this reasons to take this   bisacodyl 5 MG EC tablet Commonly known as: DULCOLAX Take 5 mg by mouth daily as needed for moderate constipation.   citalopram 10 MG tablet Commonly known as: CELEXA Take 10 mg by mouth daily as needed.   citalopram 20 MG tablet Commonly known as: CELEXA Take 20 mg by mouth daily.   clopidogrel 75 MG tablet Commonly known as: PLAVIX Take 1 tablet (75 mg total) by mouth daily.   D3-50 1.25 MG (50000 UT) capsule Generic drug: Cholecalciferol Take 50,000 Units by mouth every 7 (seven) days.   Dexcom G6 Receiver Devi by Does not  apply route.   doxycycline 100 MG capsule Commonly known as: VIBRAMYCIN Take 1 capsule (100 mg total) by mouth 2 (two) times daily.   empagliflozin 25 MG Tabs tablet Commonly known as: Jardiance Take 1 tablet (25 mg total) by mouth daily before breakfast.   EPINEPHrine 0.3 mg/0.3 mL Soaj injection Commonly known as: EPI-PEN Inject 0.3 mLs (0.3 mg total) into the muscle once.   Fluocinolone Acetonide Body 0.01 % Oil Apply topically 2 (two) times daily.   fluticasone 50 MCG/ACT nasal spray Commonly known as: FLONASE SPRAY 2 SPRAYS INTO EACH NOSTRIL EVERY DAY   insulin lispro 100 UNIT/ML injection Commonly known as: HUMALOG Max daily 100 units per OMNIPOD   levocetirizine 5 MG tablet Commonly known as: XYZAL Take 5 mg by mouth daily.   losartan-hydrochlorothiazide 50-12.5 MG tablet Commonly known as: Hyzaar Take 1 tablet by mouth daily.   lubiprostone 24 MCG capsule Commonly known as: AMITIZA Take 1 capsule (24 mcg total) by mouth 2 (two) times daily with a meal.   Magnesium 200 MG Tabs Take 1 tablet (200 mg total) by mouth daily.   meloxicam 15 MG tablet Commonly known  as: Mobic Take 1 tablet (15 mg total) by mouth daily. Toxic effects may be increased with concurrent administration of NSAIDs and Selective Serotonin Reuptake Inhibitors. The risk of upper gastrointestinal bleeding may be increased. Patients taking both drugs concurrently should be educated about the signs and symptoms of GI bleeding.   mupirocin ointment 2 % Commonly known as: BACTROBAN Apply 1 application topically 2 (two) times daily. What changed:  when to take this reasons to take this   nitroGLYCERIN 0.4 MG SL tablet Commonly known as: NITROSTAT PLACE 1 TAB UNDER TONGUE EVERY 5 MINS AS NEEDED FOR CHEST PAIN - MAX 3 DOSES THEN 911   Omnipod DASH Pods (Gen 4) Misc CHANGE EVERY 72 HOURS   pantoprazole 40 MG tablet Commonly known as: PROTONIX TAKE 1 TABLET BY MOUTH EVERY DAY   polyethylene  glycol 17 g packet Commonly known as: MIRALAX / GLYCOLAX Take 17 g by mouth daily as needed for moderate constipation or severe constipation.   potassium chloride 10 MEQ tablet Commonly known as: KLOR-CON Take 10 mEq by mouth daily.   propranolol ER 60 MG 24 hr capsule Commonly known as: INDERAL LA Take 1 capsule (60 mg total) by mouth every evening.   rosuvastatin 40 MG tablet Commonly known as: CRESTOR Take 1 tablet (40 mg total) by mouth every morning.   SUMAtriptan 100 MG tablet Commonly known as: IMITREX Take 100 mg by mouth every 2 (two) hours as needed for migraine.   temazepam 30 MG capsule Commonly known as: RESTORIL Take 30 mg by mouth at bedtime as needed for sleep.   traZODone 100 MG tablet Commonly known as: DESYREL Take 1 tablet (100 mg total) by mouth at bedtime.   Vitamin D (Ergocalciferol) 1.25 MG (50000 UNIT) Caps capsule Commonly known as: DRISDOL Take 50,000 Units by mouth every 7 (seven) days.         OBJECTIVE:   Vital Signs: BP 138/80 (BP Location: Left Arm, Patient Position: Sitting, Cuff Size: Normal)    Pulse 94    Ht 5\' 4"  (1.626 m)    Wt 141 lb (64 kg)    SpO2 99%    BMI 24.20 kg/m   Wt Readings from Last 3 Encounters:  06/15/21 141 lb (64 kg)  06/08/21 143 lb (64.9 kg)  03/19/21 141 lb 9.6 oz (64.2 kg)     Exam: General: Pt appears well and is in NAD  Lungs: Clear with good BS bilat with no rales, rhonchi, or wheezes  Heart: RRR with normal S1 and S2 and no gallops; no murmurs; no rub  Extremities: No pretibial edema.  Neuro: MS is good with appropriate affect, pt is alert and Ox3      DM foot exam: 03/19/2021   The skin of the feet is intact without sores or ulcerations. The pedal pulses are 1 + bilaterally  The sensation is intact to a screening 5.07, 10 gram monofilament bilaterally      DATA REVIEWED:   Latest Reference Range & Units 06/15/21 16:22  Sodium 135 - 146 mmol/L 141  Potassium 3.5 - 5.3 mmol/L 4.0   Chloride 98 - 110 mmol/L 104  CO2 20 - 32 mmol/L 25  Glucose 65 - 99 mg/dL 182 (H)  BUN 7 - 25 mg/dL 12  Creatinine 0.50 - 1.05 mg/dL 0.94  Calcium 8.6 - 10.4 mg/dL 10.6 (H)  BUN/Creatinine Ratio 6 - 22 (calc) NOT APPLICABLE    Latest Reference Range & Units 06/15/21 16:22  Total CHOL/HDL Ratio <  5.0 (calc) 2.9  Cholesterol <200 mg/dL 126  HDL Cholesterol > OR = 50 mg/dL 44 (L)  LDL Cholesterol (Calc) mg/dL (calc) 62  MICROALB/CREAT RATIO <30 mcg/mg creat 48 (H)  Non-HDL Cholesterol (Calc) <130 mg/dL (calc) 82  Triglycerides <150 mg/dL 118    Latest Reference Range & Units 06/15/21 16:22  Glucose 65 - 99 mg/dL 182 (H)    Latest Reference Range & Units 06/15/21 16:22  Microalb, Ur mg/dL 2.6  MICROALB/CREAT RATIO <30 mcg/mg creat 48 (H)  Creatinine, Urine 20 - 275 mg/dL 54          12/23/2020 A1c 8.4%  08/22/2020 BUN 12 Creatinine 0.780 TG 191 LDL 56  Glutamic Acid Decarb Ab <5 IU/mL <5     ISLET CELL ANTIBODY SCREEN Negative  ASSESSMENT / PLAN / RECOMMENDATIONS:   1) Type 2 Diabetes Mellitus, with poorly controlled, 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 Goal A1c < 7.0 %.     -The patient has been noted with hyperglycemia and worsening diabetes control  -Unfortunately this will worsen in the next couple days without proper adjustments to her pump as she did receive a left hip intra-articular injection -In review of CGM download the patient has been noted with an predictable hypoglycemia and at times optimal BG's, in review of insertion site the patient had her insulin pump on a lipohypertrophic area, I did explain to the patient that this area should be avoided due to an predictable absorption of insulin hence the variability without a pattern of her BG readings -She will change the side and avoid this site for months to come - Has Hx of pancreatitis in 2010, so DPP-4 inhibitors and GLp-1 agonists are   CONTRAINDICATED - GAD-65 and Islet cell Ab's negative  -I am going to increase her basal rate during the day, adjust her insulin to carb ratio as well as sensitivity factor -I have also prescribed OmniPod 5 and put in a referral for training   MEDICATIONS: Jardiance 25 mg daily  Humalog   Pump   Omnipod Settings   Insulin type   Humalog    Basal rate       0000 -0400 1.60 u/h    0400-0800 1.60   0800-000 2.0      I:C ratio       0000 1:6                  Sensitivity       0000  15      Goal       0000  120         EDUCATION / INSTRUCTIONS: BG monitoring instructions: Patient is instructed to check her blood sugars 4 times a day, before meals and bedtime . Call [facility] if: BG persistently < 70  I reviewed the Rule of 15 for the treatment of hypoglycemia in detail with the patient. Literature supplied.   2) Diabetic complications:  Eye: Does not have known diabetic retinopathy.  Neuro/ Feet: Does  have known diabetic peripheral neuropathy .  Renal: Patient does not have known baseline CKD.    3) Dyslipidemia:  - LDL at goal  Medication Rosuvastatin 40 mg daily    4) Microalbuminuria :   - Slight elevation in MA/Cr ratio. She is on Losartan- HCTZ       F/U 07/20/2021   Signed electronically by: Mack Guise, MD  [facility]  [facility] [address], [address] [place],  [address] Phone: [phone] FAX: [phone]   CC: [name], MD [place] [address] Phone: [phone]  Fax: [phone]  Return to Endocrinology clinic as below: Future Appointments  Date Time Provider [place]  06/15/2021  4:00 PM Jaloni Sorber, Melanie Crazier, MD [facility] None  07/20/2021  1:20 PM Dajion Bickford, Melanie Crazier, MD [facility] None

## 2021-06-15 NOTE — Telephone Encounter (Signed)
Patient has now been scheduled for an OV today @4 :00 and advised to come around 3:30/3:45 regarding her high sugars.

## 2021-06-15 NOTE — Telephone Encounter (Signed)
error 

## 2021-06-16 LAB — MICROALBUMIN / CREATININE URINE RATIO
Creatinine, Urine: 54 mg/dL (ref 20–275)
Microalb Creat Ratio: 48 mcg/mg creat — ABNORMAL HIGH (ref <30)
Microalb, Ur: 2.6 mg/dL

## 2021-06-16 LAB — LIPID PANEL
Cholesterol: 126 mg/dL (ref <200)
HDL: 44 mg/dL — ABNORMAL LOW (ref >=50)
LDL Cholesterol (Calc): 62 mg/dL (calc)
Non-HDL Cholesterol (Calc): 82 mg/dL (calc) (ref <130)
Total CHOL/HDL Ratio: 2.9 (calc) (ref <5.0)
Triglycerides: 118 mg/dL (ref <150)

## 2021-06-16 LAB — BASIC METABOLIC PANEL
BUN: 12 mg/dL (ref 7–25)
CO2: 25 mmol/L (ref 20–32)
Calcium: 10.6 mg/dL — ABNORMAL HIGH (ref 8.6–10.4)
Chloride: 104 mmol/L (ref 98–110)
Creat: 0.94 mg/dL (ref 0.50–1.05)
Glucose, Bld: 182 mg/dL — ABNORMAL HIGH (ref 65–99)
Potassium: 4 mmol/L (ref 3.5–5.3)
Sodium: 141 mmol/L (ref 135–146)

## 2021-06-18 ENCOUNTER — Encounter: Payer: Self-pay | Admitting: Internal Medicine

## 2021-06-18 DIAGNOSIS — E1165 Type 2 diabetes mellitus with hyperglycemia: Secondary | ICD-10-CM | POA: Diagnosis not present

## 2021-06-18 DIAGNOSIS — E1149 Type 2 diabetes mellitus with other diabetic neurological complication: Secondary | ICD-10-CM | POA: Diagnosis not present

## 2021-06-19 ENCOUNTER — Telehealth: Payer: Self-pay | Admitting: Nutrition

## 2021-06-19 ENCOUNTER — Other Ambulatory Visit: Payer: Self-pay | Admitting: Internal Medicine

## 2021-06-19 NOTE — Telephone Encounter (Signed)
Message left on machine to call me to schedule an appointment to evaluate her pump and sites for infusion sets.

## 2021-06-19 NOTE — Telephone Encounter (Signed)
Left message on phone and my chart to call and schedule an appointment

## 2021-06-19 NOTE — Telephone Encounter (Signed)
Patient reports that she changed out her pod at 11:30, when blood sugar was 347.  Blood sugar dropped to 282, and she then ate at 1:15 and now (1:33PM) blood sugar is starting to rise to 294.  Advised her to call me at 4:30 today to make sure blood sugar comes down to below 200.  She agreed to do this.  4:30 PM:   Pt. Reports that blood sugar rose to "Hi" on CGM, but then drop to now 324.  She just did a correction dose and the pump gave her 10.3 units.  She was told not to eat for at least 2 more hours to see if readings will come down more.  She reports that she is not hungry and will not eat until after 8PM.  She is scheduled for the omniPod 5 pump start on Tuesday.

## 2021-06-19 NOTE — Telephone Encounter (Signed)
Patient reports that blood sugars are still high, but did not want to come in today, because she wants to come on Monday to start her new pump.   She was talked into checking the bolus limit, which was set to 15.  She was talked into changing this to 31.  Her pod is currently out of insulin.  She will put on a new pod and try to give a correction bolus.  She reports that she has IOB from breakfast at 9AM today.  I explained what IOB was and  that this number will be subtracted from the told bolus needed to bring her blood sugar down.  she said she understood this.  She was told to call me at [facility] to make sure the blood sugar has come down.  She agreed to do this.

## 2021-06-20 NOTE — Telephone Encounter (Signed)
Information sent to mychart per patient request

## 2021-06-21 ENCOUNTER — Encounter: Payer: Self-pay | Admitting: Internal Medicine

## 2021-06-26 ENCOUNTER — Other Ambulatory Visit: Payer: Self-pay

## 2021-06-26 ENCOUNTER — Encounter: Payer: Medicare Other | Attending: Internal Medicine | Admitting: Nutrition

## 2021-06-26 DIAGNOSIS — Z794 Long term (current) use of insulin: Secondary | ICD-10-CM | POA: Insufficient documentation

## 2021-06-26 DIAGNOSIS — E1142 Type 2 diabetes mellitus with diabetic polyneuropathy: Secondary | ICD-10-CM | POA: Insufficient documentation

## 2021-06-27 ENCOUNTER — Encounter: Payer: Medicare Other | Admitting: Nutrition

## 2021-06-29 ENCOUNTER — Other Ambulatory Visit: Payer: Self-pay

## 2021-06-29 MED ORDER — OMNIPOD 5 DEXG7G6 PODS GEN 5 MISC: 1.0000 | 3 refills | Status: DC

## 2021-06-29 MED ORDER — OMNIPOD 5 DEXG7G6 INTRO GEN 5 KIT: 1.0000 | PACK | 0 refills | Status: DC

## 2021-06-30 NOTE — Patient Instructions (Signed)
Call me when you get a new phone, to start you new pump.

## 2021-06-30 NOTE — Progress Notes (Signed)
Patient is here to start her OmniPod 5 and Dexcom, but her phone would not support the Dexcom app.  She was told that she will only be able to use her new pump like the Dash, in that it will not stop the flow of insulin when low, or give extra when high.  She did not want to continue this session, saying she will get a new phone and call me to reschedule.

## 2021-07-03 ENCOUNTER — Other Ambulatory Visit: Payer: Self-pay

## 2021-07-03 ENCOUNTER — Encounter: Payer: Medicare Other | Admitting: Nutrition

## 2021-07-03 DIAGNOSIS — Z794 Long term (current) use of insulin: Secondary | ICD-10-CM

## 2021-07-03 DIAGNOSIS — E1142 Type 2 diabetes mellitus with diabetic polyneuropathy: Secondary | ICD-10-CM

## 2021-07-04 ENCOUNTER — Telehealth: Payer: Self-pay | Admitting: Nutrition

## 2021-07-06 NOTE — Progress Notes (Signed)
Patient did not have a phone capable of downloading Dexcom G6 app, and has since gotten one and was needing to link phone CGM to pod that she is currently wearing.  She was shown how to do this, and she is now linked to [place].  We reviewed again how to bolus, which she has been doing, and she reported good understanding of this.  We also reviewed high blood sugar protocols, and sick day guidelines.  She signed the training checklist as understanding  all topics and had no final questions.  I was out of our pump handout booklet with paperwork for high blood sugar protocols, supplies to carry with you at all times, sick day guidelines and high/low blood sugar protocols.  This was mailed to her and she was told to read this over and call if questions.   She agreed to do this and had no final questions.

## 2021-07-06 NOTE — Patient Instructions (Signed)
Read over pump manual, and pump starter booklet Read over pump handout pages when you receive these in the mail, and call if questions Call the pump help line if questions with how to work the pump. Call office if blood sugars drop low or remain over 200.

## 2021-07-07 ENCOUNTER — Telehealth: Payer: Self-pay | Admitting: Nutrition

## 2021-07-07 NOTE — Telephone Encounter (Signed)
Patient reported that she had no difficulty with giving the bolus or using the PDM.  The Dexcom is now linked to her phone and readings are sent to her PDM.   ?She had no questions for me at this time. ?

## 2021-07-07 NOTE — Telephone Encounter (Signed)
Patient reports that she is having no difficulty using the pump, but says blood sugars are up and down without reason.  She was reminded that pump will take 2 pod changes before making significant changes to basal rate, etc.  She says she is bolusing, using the correct amount of carbs, but blood sugars rising significantly after meals.  She was told to call the office with this complaint, but says she will wait until Monday to see if they level out.  She asked that download be reviewed by myself and Dr. Kelton Pillar on Monday.  She was reminded that she has an appointment with Dr. Kelton Pillar on 07/20/21 for a review.   ?

## 2021-07-09 ENCOUNTER — Other Ambulatory Visit: Payer: Self-pay

## 2021-07-09 ENCOUNTER — Telehealth: Payer: Self-pay | Admitting: Nutrition

## 2021-07-09 MED ORDER — OMNIPOD 5 DEXG7G6 PODS GEN 5 MISC: 1.0000 | 3 refills | Status: DC

## 2021-07-09 NOTE — Telephone Encounter (Signed)
Patient is requesting that you please send in a new script for her OmniPod 5 pod for a 3 month supply in stead of a one month. ?

## 2021-07-09 NOTE — Telephone Encounter (Signed)
Patient was contacted and she did indeed have a 1u/5 grams, and this was changed to 1u/4 grams.

## 2021-07-09 NOTE — Telephone Encounter (Signed)
Patient talked into making the change from 1u/5 grams to 1u/4 grams. ?

## 2021-07-09 NOTE — Telephone Encounter (Signed)
Script resent

## 2021-07-11 ENCOUNTER — Telehealth: Payer: Self-pay

## 2021-07-11 MED ORDER — OMNIPOD 5 DEXG7G6 PODS GEN 5 MISC: 1.0000 | 3 refills | Status: DC

## 2021-07-11 NOTE — Telephone Encounter (Signed)
Script sent with 15 pods ?

## 2021-07-12 DIAGNOSIS — I10 Essential (primary) hypertension: Secondary | ICD-10-CM | POA: Diagnosis not present

## 2021-07-12 DIAGNOSIS — E1165 Type 2 diabetes mellitus with hyperglycemia: Secondary | ICD-10-CM | POA: Diagnosis not present

## 2021-07-12 DIAGNOSIS — E559 Vitamin D deficiency, unspecified: Secondary | ICD-10-CM | POA: Diagnosis not present

## 2021-07-13 DIAGNOSIS — M25551 Pain in right hip: Secondary | ICD-10-CM | POA: Diagnosis not present

## 2021-07-13 DIAGNOSIS — M25552 Pain in left hip: Secondary | ICD-10-CM | POA: Diagnosis not present

## 2021-07-16 ENCOUNTER — Encounter (HOSPITAL_COMMUNITY): Payer: Self-pay | Admitting: Physical Therapy

## 2021-07-16 ENCOUNTER — Ambulatory Visit (HOSPITAL_COMMUNITY): Payer: Medicare Other | Attending: Orthopedic Surgery | Admitting: Physical Therapy

## 2021-07-16 ENCOUNTER — Other Ambulatory Visit: Payer: Self-pay

## 2021-07-16 DIAGNOSIS — Z789 Other specified health status: Secondary | ICD-10-CM | POA: Diagnosis not present

## 2021-07-16 DIAGNOSIS — R29898 Other symptoms and signs involving the musculoskeletal system: Secondary | ICD-10-CM | POA: Insufficient documentation

## 2021-07-16 DIAGNOSIS — R262 Difficulty in walking, not elsewhere classified: Secondary | ICD-10-CM | POA: Diagnosis not present

## 2021-07-16 NOTE — Therapy (Signed)
OUTPATIENT PHYSICAL THERAPY THORACOLUMBAR EVALUATION   Patient Name: [name] MRN: [id number] DOB:[birth year], 66 y.o., female Today's Date: 07/16/2021   PT End of Session - 07/16/21 1436     PT Start Time 1355    PT Stop Time 2878    PT Time Calculation (min) 38 min             Past Medical History:  Diagnosis Date   Allergic urticaria 07/10/2015   Allergy    Anemia    hx   Angioedema 07/10/2015   Anxiety    Arthritis    "neck, left hand" (09/14/2012)   Asthma    Cancer ([place]) 1985   ovarian, no treatment except surgery   Cataract    Complication of anesthesia    [place] TO RIGHT   Critical lower limb ischemia ([place])    10/2014 s/p L SFA stenting   Diverticulosis of colon with hemorrhage April 2013   GERD (gastroesophageal reflux disease)    GI bleed    H/O hiatal hernia    Heart attack ([place])    2003 mild MI, March 2013 mild MI   Hidradenitis    groin   History of blood transfusion 1985 AND 2013   Hyperlipidemia    Hypertension    Irritable bowel syndrome    Left-sided weakness    since stroke, left eye trouble seeing   Migraines    Mild CAD    a. Cath 09/2010: mild luminal irregularities of LAD, 30% prox RCA and 20-30% mRCA, EF 65%.   Neuropathy    Obesity    Osteoporosis    PAD (peripheral artery disease) ([place])    a. critical limb ischemia s/p PTA/stenting of L SFA 10/2014. c. occ prior SFA stent by angio 01/2016, for possible PV bypass.   Pneumonia    baby   Recurrent upper respiratory infection (URI)    S/P arterial stent-mid Lt SFA 11/03/14 11/04/2014   Schatzki's ring    Sinus problem    Stomach ulcer 1972   non-bleeding   Stroke ([place]) 07-2007, 07-2008, 07-2009   total 3 strokes; mild left sided weakness and left eye "jumps".   Type II diabetes mellitus (HCC)    Vitamin B 12 deficiency 07-15-2013   Past Surgical History:  Procedure Laterality Date   ABDOMINAL HYSTERECTOMY  1985   ADENOIDECTOMY     ANTERIOR CERVICAL  DECOMP/DISCECTOMY FUSION  2002   ANTERIOR CERVICAL DECOMP/DISCECTOMY FUSION N/A 04/08/2014   Procedure: Cervical Six-Seven ANTERIOR CERVICAL DECOMPRESSION/DISCECTOMY FUSION Plating and Bonegraft  2 LEVELS;  Surgeon: Ashok Pall, MD;  Location: [place] [facility];  Service: Neurosurgery;  Laterality: N/A;  Cervical Six-Seven ANTERIOR CERVICAL DECOMPRESSION/DISCECTOMY FUSION Plating and Bonegraft  2 LEVELS   APPENDECTOMY  1985   AXILLARY HIDRADENITIS EXCISION  1990-2008   bilateral   BACK SURGERY     BREAST BIOPSY Right 2007   BREAST CYST EXCISION Right 2008   BREAST REDUCTION SURGERY     CARDIAC CATHETERIZATION  2004   mild disease   CATARACT EXTRACTION W/PHACO Left 05/16/2015   Procedure: CATARACT EXTRACTION PHACO AND INTRAOCULAR LENS PLACEMENT (IOC);  Surgeon: Rutherford Guys, MD;  Location: [facility];  Service: Ophthalmology;  Laterality: Left;  CDE: 4.24   CATARACT EXTRACTION W/PHACO Right 05/30/2015   Procedure: CATARACT EXTRACTION RIGHT EYE PHACO AND INTRAOCULAR LENS PLACEMENT ;  Surgeon: Rutherford Guys, MD;  Location: [facility];  Service: Ophthalmology;  Laterality: Right;  CDE:4.08   CHOLECYSTECTOMY  1990's  COLONOSCOPY  08/12/2011   Procedure: COLONOSCOPY;  Surgeon: Ladene Artist, MD,FACG;  Location: [facility];  Service: Endoscopy;  Laterality: N/A;   COLONOSCOPY  06/19/2006   COLONOSCOPY WITH PROPOFOL N/A 07/02/2019   Procedure: COLONOSCOPY WITH PROPOFOL;  Surgeon: Rogene Houston, MD;  [facility];  Service: Endoscopy;  Laterality: N/A;   cyst thigh Right    ESOPHAGOGASTRODUODENOSCOPY  08/12/2011   Procedure: ESOPHAGOGASTRODUODENOSCOPY (EGD);  Surgeon: Ladene Artist, MD,FACG;  Location: [facility] ENDOSCOPY;  Service: Endoscopy;  Laterality: N/A;   ESOPHAGOGASTRODUODENOSCOPY  06/04/2005   ESOPHAGOGASTRODUODENOSCOPY (EGD) WITH PROPOFOL N/A 07/01/2019   Procedure: ESOPHAGOGASTRODUODENOSCOPY (EGD) WITH PROPOFOL;  Surgeon: Rogene Houston, MD;  [facility];  Service: Endoscopy;   Laterality: N/A;   FEMORAL-POPLITEAL BYPASS GRAFT Left 06/19/2016   Procedure: Left Leg BYPASS GRAFT FEMORAL-POPLITEAL ARTERY;  Surgeon: Serafina Mitchell, MD;  Location: [place];  Service: Vascular;  Laterality: Left;   GIVENS CAPSULE STUDY  08/13/2011   Procedure: GIVENS CAPSULE STUDY;  Surgeon: Ladene Artist, MD,FACG;  Location: [facility] ENDOSCOPY;  Service: Endoscopy;  Laterality: N/A;   HAMMER TOE SURGERY Bilateral ~ 2000   HEMOSTASIS CLIP PLACEMENT  07/02/2019   Procedure: HEMOSTASIS CLIP PLACEMENT;  Surgeon: Rogene Houston, MD;  [facility];  Service: Endoscopy;;  hepatic flexure   HIATAL HERNIA REPAIR     HYDRADENITIS EXCISION  01/2011; 03/2012   'groin and abdomen; 03/2012" (09/14/2012)   HYDRADENITIS EXCISION  04/01/2012   Procedure: EXCISION HYDRADENITIS GROIN;  Surgeon: Pedro Earls, MD;  Location: [facility];  Service: General;  Laterality: Bilateral;  Excision of Hydradenitis of Perineum   HYDRADENITIS EXCISION N/A 09/17/2013   Procedure: EXCISION PERINEAL HIDRADENITIS ;  Surgeon: Pedro Earls, MD;  Location: [facility];  Service: General;  Laterality: N/A;  also in the pubis area   LEFT HEART CATH AND CORONARY ANGIOGRAPHY N/A 12/26/2016   Procedure: LEFT HEART CATH AND CORONARY ANGIOGRAPHY;  Surgeon: [name], Peter M, MD;  Location: [facility];  Service: Cardiovascular;  Laterality: N/A;   MASS EXCISION Right 09/17/2013   Procedure: EXCISION MASS;  Surgeon: Pedro Earls, MD;  Location: [facility];  Service: General;  Laterality: Right;   NISSEN FUNDOPLICATION  6644   PERIPHERAL VASCULAR CATHETERIZATION N/A 11/03/2014   Procedure: Lower Extremity Angiography;  Surgeon: Lorretta Harp, MD;  Location: [place] CV [facility];  Service: Cardiovascular;  Laterality: N/A;   PERIPHERAL VASCULAR CATHETERIZATION N/A 01/29/2016   Procedure: Lower Extremity Angiography;  Surgeon: Lorretta Harp, MD;  Location: [place] [facility];  Service: Cardiovascular;  Laterality: N/A;   POLYPECTOMY   07/02/2019   Procedure: POLYPECTOMY;  Surgeon: Rogene Houston, MD;  [facility];  Service: Endoscopy;;   POSTERIOR LUMBAR FUSION  2008 X 2   REDUCTION MAMMAPLASTY  1996?   TONSILLECTOMY AND ADENOIDECTOMY  1959 AND 2000   UVULOPALATOPHARYNGOPLASTY, TONSILLECTOMY AND SEPTOPLASTY  2000's   Patient Active Problem List   Diagnosis Date Noted   Dyslipidemia 06/15/2021   Chronic insomnia 08/28/2020   Inadequate sleep hygiene 08/28/2020   Primary snoring 08/28/2020   Excessive daytime sleepiness 08/28/2020   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   Chronic intermittent hypoxia with obstructive sleep apnea 08/28/2020   Vitamin B12 deficiency anemia due to intrinsic factor deficiency 08/28/2020   Diabetes mellitus with gastroparesis ([place]) 08/28/2020   Type 2 diabetes mellitus with diabetic polyneuropathy, with long-term current use of insulin ([place]) 04/17/2020   Diabetes mellitus (  [place]) 04/17/2020   Generalized abdominal pain 12/15/2019   AKI (acute kidney injury) ([place]) 07/28/2019   Hypokalemia 06/28/2019   Generalized weakness 06/28/2019   Syncope and collapse 06/20/2019   Tobacco abuse 05/04/2019   Diabetic gastroparesis associated with type 2 diabetes mellitus ([place]) 11/25/2017   Precordial chest pain 12/24/2016   Nausea & vomiting 12/24/2016   PAD (peripheral artery disease) ([place]) 06/19/2016   Diabetic neuropathy ([place]) 01/31/2016   Obesity 01/31/2016   Mild CAD    Claudication ([place]) 01/29/2016   Food allergy 08/02/2015   Recurrent urticaria 07/10/2015   Angioedema 07/10/2015   Perennial allergic rhinitis with a possible non-allergic component 07/10/2015   History of asthma 07/10/2015   S/P femoral-popliteal bypass surgery 11/04/2014   Critical lower limb ischemia ([place]) 11/03/2014   Peripheral arterial disease ([place]) 11/02/2014   HNP (herniated nucleus pulposus) with myelopathy, cervical 04/11/2014   HNP (herniated nucleus pulposus), cervical 04/08/2014   Pancreatitis  01/02/2013   Abdominal pain, epigastric 12/14/2012   IBS (irritable bowel syndrome) 09/12/2011   GERD (gastroesophageal reflux disease) 09/12/2011   Diverticulosis of colon with hemorrhage 08/10/2011   Chest pain at rest 07/06/2011    Class: Acute   Hidradenitis, left pubic area and left lower quadrant 01/18/2011   DM2 (diabetes mellitus, type 2) ([place]) 08/24/2007   Hyperlipidemia LDL goal <70 08/24/2007   Essential hypertension 08/24/2007    PCP: [name], MD  REFERRING PROVIDER: Renette Butters, MD  REFERRING DIAG: Lumbago  THERAPY DIAG:  Deficit in activities of daily living (ADL)  Difficulty in walking, not elsewhere classified  Other symptoms and signs involving the musculoskeletal system  ONSET DATE: January 2023  SUBJECTIVE:                                                                                                                                                                                           SUBJECTIVE STATEMENT: Patient reports that she was scheduled to have a Rt hip replacement earlier this year, but got sick and the appointment was canceled. She has had the pain in her Rt hip switch to the Lt hip and she had an injection in the Lt hip which was partially successful at limiting her pain level. Her most recent bout of low back pain began due to a slip she had in the shower. PERTINENT HISTORY:  2 previous lumbar disk related surgeries, 3 strokes, and diabetes  PAIN:  Are you having pain? Yes: NPRS scale: 6/10 Pain location: low back Pain description: pressure Aggravating factors: None noted Relieving factors: None noted   PRECAUTIONS: None  WEIGHT BEARING RESTRICTIONS No  FALLS:  Has patient fallen in last 6 months? Yes, Number of falls: 1(Late January)  LIVING ENVIRONMENT: Lives with: lives alone Lives in: House/apartment Stairs: No; Has following equipment at home: shower chair and raised toilet seat  OCCUPATION:  Receptionist  PLOF: Independent  PATIENT GOALS Be able to walk without the pain in her low back.   OBJECTIVE:   DIAGNOSTIC FINDINGS:  Impression of CT scan performed on 02/20/2020, "1. Fluid throughout small and large bowel loops which could be related to colonoscopy prep and/or enteritis.   2. There are two - possibly three - areas of focal architectural distortion of the large bowel raising the possibility of Adenocarcinoma. But no regional lymphadenopathy or metastatic disease is identified. Post-inflammatory stricture is a differential consideration. There is no acute bowel inflammation. Recommend correlation with conventional colonoscopy, or failing that virtual colonoscopy.   3. Otherwise stable CT appearance of the abdomen and pelvis since 2017. Advanced atherosclerosis is noted, with evidence of interval left femoral artery bypass graft."   SCREENING FOR RED FLAGS: Bowel or bladder incontinence: No Spinal tumors: No Cauda equina syndrome: No Compression fracture: No Abdominal aneurysm: No  COGNITION:  Overall cognitive status: Within functional limits for tasks assessed     SENSATION: Sensation deficit in LLE from the knee down due to previous bypass.  POSTURE:  Increased lumbar lordosis in sitting  PALPATION: TTP over the Lt sciatica nerve/piriformis   LUMBAR ROM:   Active  A/PROM  07/16/2021  Flexion 50%(slight pain)  Extension 25%(pain)  Right lateral flexion 100%  Left lateral flexion 100%  Right rotation 75%  Left rotation 50%(Lt low back pain)   (Blank rows = not tested)  LE ROM:  Passive  Right 07/16/2021 Left 07/16/2021  Hip flexion WNL WNL  Hip extension    Hip abduction    Hip adduction    Hip internal rotation 29 45  Hip external rotation 48 49  Knee flexion    Knee extension    Ankle dorsiflexion    Ankle plantarflexion    Ankle inversion    Ankle eversion     (Blank rows = not tested)  LE MMT:  MMT Right 07/16/2021  Left 07/16/2021  Hip flexion    Hip extension    Hip abduction    Hip adduction    Hip internal rotation    Hip external rotation    Knee flexion    Knee extension    Ankle dorsiflexion    Ankle plantarflexion    Ankle inversion    Ankle eversion     (Blank rows = not tested)  LUMBAR SPECIAL TESTS:  Straight leg raise test: Positive, Slump test: Negative, FABER test: Positive, and Thomas test: Positive  GAIT: Distance walked: 64f Assistive device utilized: None Level of assistance: Complete Independence Comments: Decreased gait speed and hip active hip flexion during ambulation bilaterally.    TODAY'S TREATMENT  Thomas stretch and HEP discussion.   PATIENT EDUCATION:  Education details: HEP, POC, and anterior hip tightness impact on low back pain. Person educated: Patient Education method: Explanation, Demonstration, and Handouts Education comprehension: verbalized understanding   HOME EXERCISE PROGRAM: Prone SIJ Anterior Rotation Mobilization on Table - 2-3 x daily - 7 x weekly - 5 reps - 15s hold Supine Bilateral Hip Internal Rotation Stretch - 2-3 x daily - 7 x weekly - 10 reps - 5s hold Supine Lower Trunk Rotation - 2-3 x daily - 7 x weekly - 12 reps - 2s hold  ASSESSMENT:  CLINICAL  IMPRESSION: Patient is a 66 y.o. female presenting to physical therapy with c/o low back and bilateral hip pain following a fall in her shower earlier this year. She presents with pain limited deficits in abdominal and bilateral hip flexor strength, lumbar ROM, postural impairments, spinal mobility and functional mobility with ADL. She is having to modify and restrict ADL as indicated by subjective information and objective measures which is affecting overall participation. Patient will benefit from skilled physical therapy in order to improve function and reduce impairment. Of note, the patient did arrive ~10 minutes late to her appointment.   OBJECTIVE IMPAIRMENTS decreased activity  tolerance, difficulty walking, decreased ROM, decreased strength, impaired flexibility, postural dysfunction, and pain.   ACTIVITY LIMITATIONS community activity, driving, meal prep, occupation, laundry, yard work, shopping, and yard work.   PERSONAL FACTORS Age, Behavior pattern, and 1 comorbidity: diabetes  are also affecting patient's functional outcome.    REHAB POTENTIAL: Fair    CLINICAL DECISION MAKING: Stable/uncomplicated  EVALUATION COMPLEXITY: Low   GOALS: Goals reviewed with patient? No  LONG TERM GOALS: Target date: 08/20/2021  Patient will achieve a resting low back pain level of no greater than 2/10. Baseline: 4/10 Goal status: INITIAL  2.  Patient will achieve a bilateral resting hip pain level of no greater than 3/10 Baseline: 8.5/10 Goal status: INITIAL  3.  Patient will be able to ambulate at least 377f without an onset of lumbar or hip pain increasing more than 2 points on the NPRS. Baseline: 870fGoal status: INITIAL  4.  Patient will demonstrate a negative Thomas test bilaterally. Baseline: Pos bilaterally with pain Goal status: INITIAL  5.  Patient will be independent with her HEP. Baseline: N/A Goal status: INITIAL    PLAN: PT FREQUENCY: 1x/week  PT DURATION: other: 5 weeks  PLANNED INTERVENTIONS: Therapeutic exercises, Therapeutic activity, Neuromuscular re-education, Balance training, Gait training, Patient/Family education, Joint mobilization, Dry Needling, Electrical stimulation, and Spinal mobilization.  PLAN FOR NEXT SESSION: Continue to address anterior hip and lumbar mobility. Reassess HEP and add additional mobility exercises per patient tolerance.   CoAdalberto ColePT 07/16/2021, 3:45 PM

## 2021-07-16 NOTE — Patient Instructions (Signed)
Access Code: [email or web address] ?URL: [email or web address] ?Date: 07/16/2021 ?Prepared by: Adalberto Cole ? ?Exercises ?Prone SIJ Anterior Rotation Mobilization on Table - 2-3 x daily - 7 x weekly - 5 reps - 15s hold ?Supine Bilateral Hip Internal Rotation Stretch - 2-3 x daily - 7 x weekly - 10 reps - 5s hold ?Supine Lower Trunk Rotation - 2-3 x daily - 7 x weekly - 12 reps - 2s hold ? ?

## 2021-07-18 DIAGNOSIS — E870 Hyperosmolality and hypernatremia: Secondary | ICD-10-CM | POA: Diagnosis not present

## 2021-07-18 DIAGNOSIS — E1165 Type 2 diabetes mellitus with hyperglycemia: Secondary | ICD-10-CM | POA: Diagnosis not present

## 2021-07-18 DIAGNOSIS — H6123 Impacted cerumen, bilateral: Secondary | ICD-10-CM | POA: Diagnosis not present

## 2021-07-18 DIAGNOSIS — I739 Peripheral vascular disease, unspecified: Secondary | ICD-10-CM | POA: Diagnosis not present

## 2021-07-18 DIAGNOSIS — E559 Vitamin D deficiency, unspecified: Secondary | ICD-10-CM | POA: Diagnosis not present

## 2021-07-18 DIAGNOSIS — E782 Mixed hyperlipidemia: Secondary | ICD-10-CM | POA: Diagnosis not present

## 2021-07-18 DIAGNOSIS — K219 Gastro-esophageal reflux disease without esophagitis: Secondary | ICD-10-CM | POA: Diagnosis not present

## 2021-07-18 DIAGNOSIS — Z0001 Encounter for general adult medical examination with abnormal findings: Secondary | ICD-10-CM | POA: Diagnosis not present

## 2021-07-18 DIAGNOSIS — I10 Essential (primary) hypertension: Secondary | ICD-10-CM | POA: Diagnosis not present

## 2021-07-18 DIAGNOSIS — G43009 Migraine without aura, not intractable, without status migrainosus: Secondary | ICD-10-CM | POA: Diagnosis not present

## 2021-07-18 DIAGNOSIS — E1149 Type 2 diabetes mellitus with other diabetic neurological complication: Secondary | ICD-10-CM | POA: Diagnosis not present

## 2021-07-18 DIAGNOSIS — K581 Irritable bowel syndrome with constipation: Secondary | ICD-10-CM | POA: Diagnosis not present

## 2021-07-19 ENCOUNTER — Ambulatory Visit (HOSPITAL_COMMUNITY): Payer: Medicare Other

## 2021-07-19 ENCOUNTER — Other Ambulatory Visit: Payer: Self-pay

## 2021-07-19 ENCOUNTER — Encounter (HOSPITAL_COMMUNITY): Payer: Self-pay

## 2021-07-19 DIAGNOSIS — R262 Difficulty in walking, not elsewhere classified: Secondary | ICD-10-CM | POA: Diagnosis not present

## 2021-07-19 DIAGNOSIS — R29898 Other symptoms and signs involving the musculoskeletal system: Secondary | ICD-10-CM | POA: Diagnosis not present

## 2021-07-19 DIAGNOSIS — Z789 Other specified health status: Secondary | ICD-10-CM | POA: Diagnosis not present

## 2021-07-19 NOTE — Therapy (Signed)
?OUTPATIENT PHYSICAL THERAPY TREATMENT NOTE ? ? ?Patient Name: [name] ?MRN: [id number] ?DOB:Mar 21, 1956, 66 y.o., female ?Today's Date: 07/19/2021 ? ?PCP: [name], MD ?REFERRING PROVIDER: Renette Butters, MD  ? ? PT End of Session - 07/19/21 1703   ? ? Visit Number 2   ? Number of Visits 5   ? Date for PT Re-Evaluation 08/20/21   ? Authorization Type [employer] Medicare   ? Progress Note Due on Visit 10   ? PT Start Time 9518   ? PT Stop Time 1700   ? PT Time Calculation (min) 39 min   ? Activity Tolerance Patient tolerated treatment well;Patient limited by pain   ? Behavior During Therapy [facility] for tasks assessed/performed   ? ?  ?  ? ?  ? ? ?Past Medical History:  ?Diagnosis Date  ? Allergic urticaria 07/10/2015  ? Allergy   ? Anemia   ? hx  ? Angioedema 07/10/2015  ? Anxiety   ? Arthritis   ? "neck, left hand" (09/14/2012)  ? Asthma   ? Cancer [facility]) 1985  ? ovarian, no treatment except surgery  ? Cataract   ? Complication of anesthesia   ? OCCASIONAL TROUBLE TURNING NECK TO RIGHT  ? Critical lower limb ischemia ([place])   ? 10/2014 s/p L SFA stenting  ? Diverticulosis of colon with hemorrhage April 2013  ? GERD (gastroesophageal reflux disease)   ? GI bleed   ? H/O hiatal hernia   ? Heart attack [facility])   ? 2003 mild MI, March 2013 mild MI  ? Hidradenitis   ? groin  ? History of blood transfusion 1985 AND 2013  ? Hyperlipidemia   ? Hypertension   ? Irritable bowel syndrome   ? Left-sided weakness   ? since stroke, left eye trouble seeing  ? Migraines   ? Mild CAD   ? a. Cath 09/2010: mild luminal irregularities of LAD, 30% prox RCA and 20-30% mRCA, EF 65%.  ? Neuropathy   ? Obesity   ? Osteoporosis   ? PAD (peripheral artery disease) ([place])   ? a. critical limb ischemia s/p PTA/stenting of L SFA 10/2014. c. occ prior SFA stent by angio 01/2016, for possible PV bypass.  ? Pneumonia   ? baby  ? Recurrent upper respiratory infection (URI)   ? S/P arterial stent-mid Lt SFA 11/03/14 11/04/2014  ? Schatzki's ring   ? Sinus problem    ? Stomach ulcer 1972  ? non-bleeding  ? Stroke [facility]) 07-2007, 07-2008, 07-2009  ? total 3 strokes; mild left sided weakness and left eye "jumps".  ? Type II diabetes mellitus ([place])   ? Vitamin B 12 deficiency 07-15-2013  ? ?Past Surgical History:  ?Procedure Laterality Date  ? ABDOMINAL HYSTERECTOMY  1985  ? ADENOIDECTOMY    ? ANTERIOR CERVICAL DECOMP/DISCECTOMY FUSION  2002  ? ANTERIOR CERVICAL DECOMP/DISCECTOMY FUSION N/A 04/08/2014  ? Procedure: Cervical Six-Seven ANTERIOR CERVICAL DECOMPRESSION/DISCECTOMY FUSION Plating and Bonegraft  2 LEVELS;  Surgeon: Ashok Pall, MD;  Location: [place] NEURO ORS;  Service: Neurosurgery;  Laterality: N/A;  Cervical Six-Seven ANTERIOR CERVICAL DECOMPRESSION/DISCECTOMY FUSION Plating and Bonegraft  2 LEVELS  ? APPENDECTOMY  1985  ? AXILLARY HIDRADENITIS EXCISION  1990-2008  ? bilateral  ? BACK SURGERY    ? BREAST BIOPSY Right 2007  ? BREAST CYST EXCISION Right 2008  ? BREAST REDUCTION SURGERY    ? CARDIAC CATHETERIZATION  2004  ? mild disease  ? CATARACT EXTRACTION W/PHACO Left 05/16/2015  ?  Procedure: CATARACT EXTRACTION PHACO AND INTRAOCULAR LENS PLACEMENT (IOC);  Surgeon: Rutherford Guys, MD;  [facility];  Service: Ophthalmology;  Laterality: Left;  CDE: 4.24  ? CATARACT EXTRACTION W/PHACO Right 05/30/2015  ? Procedure: CATARACT EXTRACTION RIGHT EYE PHACO AND INTRAOCULAR LENS PLACEMENT ;  Surgeon: Rutherford Guys, MD;  [facility]: [facility];  Service: Ophthalmology;  Laterality: Right;  CDE:4.08  ? CHOLECYSTECTOMY  1990's  ? COLONOSCOPY  08/12/2011  ? Procedure: COLONOSCOPY;  Surgeon: Ladene Artist, MD,FACG;  Location: [facility] ENDOSCOPY;  Service: Endoscopy;  Laterality: N/A;  ? COLONOSCOPY  06/19/2006  ? COLONOSCOPY WITH PROPOFOL N/A 07/02/2019  ? Procedure: COLONOSCOPY WITH PROPOFOL;  Surgeon: Rogene Houston, MD;  [facility];  Service: Endoscopy;  Laterality: N/A;  ? cyst thigh Right   ? ESOPHAGOGASTRODUODENOSCOPY  08/12/2011  ? Procedure: ESOPHAGOGASTRODUODENOSCOPY (EGD);   Surgeon: Ladene Artist, MD,FACG;  Location: [facility] ENDOSCOPY;  Service: Endoscopy;  Laterality: N/A;  ? ESOPHAGOGASTRODUODENOSCOPY  06/04/2005  ? ESOPHAGOGASTRODUODENOSCOPY (EGD) WITH PROPOFOL N/A 07/01/2019  ? Procedure: ESOPHAGOGASTRODUODENOSCOPY (EGD) WITH PROPOFOL;  Surgeon: Rogene Houston, MD;  [facility];  Service: Endoscopy;  Laterality: N/A;  ? FEMORAL-POPLITEAL BYPASS GRAFT Left 06/19/2016  ? Procedure: Left Leg BYPASS GRAFT FEMORAL-POPLITEAL ARTERY;  Surgeon: Serafina Mitchell, MD;  Location: [place];  Service: Vascular;  Laterality: Left;  ? GIVENS CAPSULE STUDY  08/13/2011  ? Procedure: GIVENS CAPSULE STUDY;  Surgeon: Ladene Artist, MD,FACG;  Location: [facility] ENDOSCOPY;  Service: Endoscopy;  Laterality: N/A;  ? HAMMER TOE SURGERY Bilateral ~ 2000  ? HEMOSTASIS CLIP PLACEMENT  07/02/2019  ? Procedure: HEMOSTASIS CLIP PLACEMENT;  Surgeon: Rogene Houston, MD;  [facility];  Service: Endoscopy;;  hepatic flexure  ? HIATAL HERNIA REPAIR    ? HYDRADENITIS EXCISION  01/2011; 03/2012  ? 'groin and abdomen; 03/2012" (09/14/2012)  ? HYDRADENITIS EXCISION  04/01/2012  ? Procedure: EXCISION HYDRADENITIS GROIN;  Surgeon: Pedro Earls, MD;  [facility];  Service: General;  Laterality: Bilateral;  Excision of Hydradenitis of Perineum  ? HYDRADENITIS EXCISION N/A 09/17/2013  ? Procedure: EXCISION PERINEAL HIDRADENITIS ;  Surgeon: Pedro Earls, MD;  [facility];  Service: General;  Laterality: N/A;  also in the pubis area  ? LEFT HEART CATH AND CORONARY ANGIOGRAPHY N/A 12/26/2016  ? Procedure: LEFT HEART CATH AND CORONARY ANGIOGRAPHY;  Surgeon: [name], Peter M, MD;  Location: [place] CV LAB;  Service: Cardiovascular;  Laterality: N/A;  ? MASS EXCISION Right 09/17/2013  ? Procedure: EXCISION MASS;  Surgeon: Pedro Earls, MD;  [facility];  Service: General;  Laterality: Right;  ? NISSEN FUNDOPLICATION  2355  ? PERIPHERAL VASCULAR CATHETERIZATION N/A 11/03/2014  ? Procedure: Lower  Extremity Angiography;  Surgeon: Lorretta Harp, MD;  Location: [place] CV LAB;  Service: Cardiovascular;  Laterality: N/A;  ? PERIPHERAL VASCULAR CATHETERIZATION N/A 01/29/2016  ? Procedure: Lower Extremity Angiography;  Surgeon: Lorretta Harp, MD;  Location: [place] CV LAB;  Service: Cardiovascular;  Laterality: N/A;  ? POLYPECTOMY  07/02/2019  ? Procedure: POLYPECTOMY;  Surgeon: Rogene Houston, MD;  [facility];  Service: Endoscopy;;  ? POSTERIOR LUMBAR FUSION  2008 X 2  ? REDUCTION MAMMAPLASTY  1996?  ? [place] AND 2000  ? UVULOPALATOPHARYNGOPLASTY, TONSILLECTOMY AND SEPTOPLASTY  2000's  ? ?Patient Active Problem List  ? Diagnosis Date Noted  ? Dyslipidemia 06/15/2021  ? Chronic insomnia 08/28/2020  ? Inadequate sleep  hygiene 08/28/2020  ? Primary snoring 08/28/2020  ? Excessive daytime sleepiness 08/28/2020  ? 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  ? Chronic intermittent hypoxia with obstructive sleep apnea 08/28/2020  ? Vitamin B12 deficiency anemia due to intrinsic factor deficiency 08/28/2020  ? Diabetes mellitus with gastroparesis ([place]) 08/28/2020  ? Type 2 diabetes mellitus with diabetic polyneuropathy, with long-term current use of insulin ([place]) 04/17/2020  ? Diabetes mellitus ([place]) 04/17/2020  ? Generalized abdominal pain 12/15/2019  ? AKI (acute kidney injury) ([place]) 07/28/2019  ? Hypokalemia 06/28/2019  ? Generalized weakness 06/28/2019  ? Syncope and collapse 06/20/2019  ? Tobacco abuse 05/04/2019  ? Diabetic gastroparesis associated with type 2 diabetes mellitus ([place]) 11/25/2017  ? Precordial chest pain 12/24/2016  ? Nausea & vomiting 12/24/2016  ? PAD (peripheral artery disease) ([place]) 06/19/2016  ? Diabetic neuropathy ([place]) 01/31/2016  ? Obesity 01/31/2016  ? Mild CAD   ? Claudication ([place]) 01/29/2016  ? Food allergy 08/02/2015  ? Recurrent urticaria 07/10/2015  ? Angioedema 07/10/2015  ? Perennial allergic rhinitis with a possible non-allergic  component 07/10/2015  ? History of asthma 07/10/2015  ? S/P femoral-popliteal bypass surgery 11/04/2014  ? Critical lower limb ischemia ([place]) 11/03/2014  ? Peripheral arterial disease ([place]) 11/02/2014  ? HN

## 2021-07-19 NOTE — Patient Instructions (Signed)
Standing on step stool, bend leg on step keeping leg behind straight.   ?Hold 30" feeling good stretch front of hip.  Don't push to pain. ?3 sets twice a day. ?

## 2021-07-20 ENCOUNTER — Encounter: Payer: Self-pay | Admitting: Internal Medicine

## 2021-07-20 ENCOUNTER — Ambulatory Visit (INDEPENDENT_AMBULATORY_CARE_PROVIDER_SITE_OTHER): Payer: Medicare Other | Admitting: Internal Medicine

## 2021-07-20 VITALS — BP 130/74 | HR 88 | Ht 64.0 in | Wt 145.8 lb

## 2021-07-20 DIAGNOSIS — E1159 Type 2 diabetes mellitus with other circulatory complications: Secondary | ICD-10-CM

## 2021-07-20 DIAGNOSIS — E1165 Type 2 diabetes mellitus with hyperglycemia: Secondary | ICD-10-CM | POA: Diagnosis not present

## 2021-07-20 DIAGNOSIS — E785 Hyperlipidemia, unspecified: Secondary | ICD-10-CM | POA: Diagnosis not present

## 2021-07-20 DIAGNOSIS — E1142 Type 2 diabetes mellitus with diabetic polyneuropathy: Secondary | ICD-10-CM | POA: Diagnosis not present

## 2021-07-20 DIAGNOSIS — Z794 Long term (current) use of insulin: Secondary | ICD-10-CM | POA: Diagnosis not present

## 2021-07-20 NOTE — Progress Notes (Signed)
?Name: [name]  ?Age/ Sex: 66 y.o., female   ?MRN/ DOB: [id number], [birth year]    ? ?PCP: [name], MD   ?Reason for Endocrinology Evaluation: Type 2 Diabetes Mellitus  ?Initial Endocrine Consultative Visit: 04/17/2020  ? ? ?PATIENT IDENTIFIER: [name] is a 66 y.o. female with a past medical history of T2DM, PAD ,CAD, HTN and Hx of pancreatitis  . The patient has followed with Endocrinology clinic since 04/17/2020 for consultative assistance with management of her diabetes. ? ?DIABETIC HISTORY:  ?[name] was diagnosed with DM in 2003, She is intolerant to Metformin due to diarrhea and Soliqua due to itching . Her hemoglobin A1c has ranged from 7.1%  in 20, peaking at 9.5% in 2015 ? ?Has chronic GI issues - Dr Carlean Purl  ? ?Islet Ab's and GAD-65 negative  ? ?Jardiance started 07/2020 ?Omnipod started 08/2020 ?  ?SUBJECTIVE:  ? ?During the last visit (06/08/2021) :  A1c 8.7%, we adjusted pump setting and and continued Jardiance  ?  ? ? ?Today (07/20/2021): [name] is here for diabetes follow up .  She checks her blood sugars multiple times a day through CGM. The patient has not had hypoglycemic episodes since the last clinic visit. ? ? ?She had diarrhea for 3 days ,last week but alternates with constipation  ? ? ? ?This patient with type 2 diabetes is treated with Omnipod (insulin pump). During the visit the pump basal and bolus doses were reviewed including carb/insulin rations and supplemental doses. The clinical list was updated. The glucose meter download was reviewed in detail to determine if the current pump settings are providing the best glycemic control without excessive hypoglycemia. ? ? ? ? ?Pump and meter download: ? ? ? ?Pump   Omnipod Settings   ?Insulin type   Humalog    ?Basal rate      ? 0000 -0400 1.60 u/h   ? 0400-0800 1.60  ? 0800-000 2.0  ?    ?I:C ratio      ? 0000 1:6  ?    ?    ?    ?    ?Sensitivity      ? 0000  15  ?    ?Goal      ? 0000  120  ?  ?   ? ? ? ? ? ?Type & Model of Pump: Omnipod ?Insulin Type: Currently using Humalog . ? ? ?PUMP STATISTICS: ?Unable to download ? ? ? ?CONTINUOUS GLUCOSE MONITORING RECORD INTERPRETATION   ? ?Dates of Recording: 3/4-3/17/2023 ? ?Sensor description: dexcom ? ?Results statistics: ?  ?CGM use % of time 100  ?Average and SD 165/42  ?Time in range  62%  ?% Time Above 180 34  ?% Time above 250 3  ?% Time Below target <1  ? ? ?Glycemic patterns summary: Hyperglycemia has been noted postprandial but her BG's trend down between meals and overnight ? ?Hyperglycemic episodes postprandial ?Hypoglycemic episodes occurred n/a ? ?Overnight periods: Trend down ? ? ? ? ? ? ? ?HOME DIABETES REGIMEN:  ?Humalog ?Jardiance 25 mg daily  ? ? ? ?Statin: yes ?ACE-I/ARB: yes ? ? ? ? ? ?DIABETIC COMPLICATIONS: ?Microvascular complications:  ?Neuropathy  ?Denies: CKD, retinopathy ?Last Eye Exam: Completed 12/26/2020 ? ?Macrovascular complications:  ?PVD ( S/P femoral-popliteal bypass), Mild CAD  ?Denies: CAD, CVA ? ? ?HISTORY:  ?Past Medical History:  ?Past Medical History:  ?Diagnosis Date  ? Allergic urticaria 07/10/2015  ? Allergy   ?  Anemia   ? hx  ? Angioedema 07/10/2015  ? Anxiety   ? Arthritis   ? "neck, left hand" (09/14/2012)  ? Asthma   ? Cancer [facility]) 1985  ? ovarian, no treatment except surgery  ? Cataract   ? Complication of anesthesia   ? OCCASIONAL TROUBLE TURNING NECK TO RIGHT  ? Critical lower limb ischemia ([place])   ? 10/2014 s/p L SFA stenting  ? Diverticulosis of colon with hemorrhage April 2013  ? GERD (gastroesophageal reflux disease)   ? GI bleed   ? H/O hiatal hernia   ? Heart attack [facility])   ? 2003 mild MI, March 2013 mild MI  ? Hidradenitis   ? groin  ? History of blood transfusion 1985 AND 2013  ? Hyperlipidemia   ? Hypertension   ? Irritable bowel syndrome   ? Left-sided weakness   ? since stroke, left eye trouble seeing  ? Migraines   ? Mild CAD   ? a. Cath 09/2010: mild luminal irregularities of LAD, 30% prox RCA and 20-30%  mRCA, EF 65%.  ? Neuropathy   ? Obesity   ? Osteoporosis   ? PAD (peripheral artery disease) ([place])   ? a. critical limb ischemia s/p PTA/stenting of L SFA 10/2014. c. occ prior SFA stent by angio 01/2016, for possible PV bypass.  ? Pneumonia   ? baby  ? Recurrent upper respiratory infection (URI)   ? S/P arterial stent-mid Lt SFA 11/03/14 11/04/2014  ? Schatzki's ring   ? Sinus problem   ? Stomach ulcer 1972  ? non-bleeding  ? Stroke [facility]) 07-2007, 07-2008, 07-2009  ? total 3 strokes; mild left sided weakness and left eye "jumps".  ? Type II diabetes mellitus ([place])   ? Vitamin B 12 deficiency 07-15-2013  ? ?Past Surgical History:  ?Past Surgical History:  ?Procedure Laterality Date  ? ABDOMINAL HYSTERECTOMY  1985  ? ADENOIDECTOMY    ? ANTERIOR CERVICAL DECOMP/DISCECTOMY FUSION  2002  ? ANTERIOR CERVICAL DECOMP/DISCECTOMY FUSION N/A 04/08/2014  ? Procedure: Cervical Six-Seven ANTERIOR CERVICAL DECOMPRESSION/DISCECTOMY FUSION Plating and Bonegraft  2 LEVELS;  Surgeon: Ashok Pall, MD;  Location: [place] NEURO ORS;  Service: Neurosurgery;  Laterality: N/A;  Cervical Six-Seven ANTERIOR CERVICAL DECOMPRESSION/DISCECTOMY FUSION Plating and Bonegraft  2 LEVELS  ? APPENDECTOMY  1985  ? AXILLARY HIDRADENITIS EXCISION  1990-2008  ? bilateral  ? BACK SURGERY    ? BREAST BIOPSY Right 2007  ? BREAST CYST EXCISION Right 2008  ? BREAST REDUCTION SURGERY    ? CARDIAC CATHETERIZATION  2004  ? mild disease  ? CATARACT EXTRACTION W/PHACO Left 05/16/2015  ? Procedure: CATARACT EXTRACTION PHACO AND INTRAOCULAR LENS PLACEMENT (IOC);  Surgeon: Rutherford Guys, MD;  [facility];  Service: Ophthalmology;  Laterality: Left;  CDE: 4.24  ? CATARACT EXTRACTION W/PHACO Right 05/30/2015  ? Procedure: CATARACT EXTRACTION RIGHT EYE PHACO AND INTRAOCULAR LENS PLACEMENT ;  Surgeon: Rutherford Guys, MD;  [facility];  Service: Ophthalmology;  Laterality: Right;  CDE:4.08  ? CHOLECYSTECTOMY  1990's  ? COLONOSCOPY  08/12/2011  ? Procedure: COLONOSCOPY;  Surgeon:  Ladene Artist, MD,FACG;  Location: [facility] ENDOSCOPY;  Service: Endoscopy;  Laterality: N/A;  ? COLONOSCOPY  06/19/2006  ? COLONOSCOPY WITH PROPOFOL N/A 07/02/2019  ? Procedure: COLONOSCOPY WITH PROPOFOL;  Surgeon: Rogene Houston, MD;  [facility];  Service: Endoscopy;  Laterality: N/A;  ? cyst thigh Right   ? ESOPHAGOGASTRODUODENOSCOPY  08/12/2011  ? Procedure: ESOPHAGOGASTRODUODENOSCOPY (EGD);  Surgeon: Pricilla Riffle  Fuller Plan, MD,FACG;  Location: [facility] ENDOSCOPY;  Service: Endoscopy;  Laterality: N/A;  ? ESOPHAGOGASTRODUODENOSCOPY  06/04/2005  ? ESOPHAGOGASTRODUODENOSCOPY (EGD) WITH PROPOFOL N/A 07/01/2019  ? Procedure: ESOPHAGOGASTRODUODENOSCOPY (EGD) WITH PROPOFOL;  Surgeon: Rogene Houston, MD;  [facility];  Service: Endoscopy;  Laterality: N/A;  ? FEMORAL-POPLITEAL BYPASS GRAFT Left 06/19/2016  ? Procedure: Left Leg BYPASS GRAFT FEMORAL-POPLITEAL ARTERY;  Surgeon: Serafina Mitchell, MD;  Location: [place];  Service: Vascular;  Laterality: Left;  ? GIVENS CAPSULE STUDY  08/13/2011  ? Procedure: GIVENS CAPSULE STUDY;  Surgeon: Ladene Artist, MD,FACG;  Location: [facility] ENDOSCOPY;  Service: Endoscopy;  Laterality: N/A;  ? HAMMER TOE SURGERY Bilateral ~ 2000  ? HEMOSTASIS CLIP PLACEMENT  07/02/2019  ? Procedure: HEMOSTASIS CLIP PLACEMENT;  Surgeon: Rogene Houston, MD;  [facility];  Service: Endoscopy;;  hepatic flexure  ? HIATAL HERNIA REPAIR    ? HYDRADENITIS EXCISION  01/2011; 03/2012  ? 'groin and abdomen; 03/2012" (09/14/2012)  ? HYDRADENITIS EXCISION  04/01/2012  ? Procedure: EXCISION HYDRADENITIS GROIN;  Surgeon: Pedro Earls, MD;  [facility];  Service: General;  Laterality: Bilateral;  Excision of Hydradenitis of Perineum  ? HYDRADENITIS EXCISION N/A 09/17/2013  ? Procedure: EXCISION PERINEAL HIDRADENITIS ;  Surgeon: Pedro Earls, MD;  [facility];  Service: General;  Laterality: N/A;  also in the pubis area  ? LEFT HEART CATH AND CORONARY ANGIOGRAPHY N/A 12/26/2016  ? Procedure:  LEFT HEART CATH AND CORONARY ANGIOGRAPHY;  Surgeon: [name], Peter M, MD;  Location: [place] CV LAB;  Service: Cardiovascular;  Laterality: N/A;  ? MASS EXCISION Right 09/17/2013  ? Procedure: EXCISION MASS;  Surgeon:

## 2021-07-20 NOTE — Patient Instructions (Signed)
?  Pump   Omnipod Settings   ?Insulin type   Humalog    ?Basal rate      ? 0000 -0400 1.60 u/h   ? 0400-0800 1.60  ? 0800-2200 2.05  ? 2200-0000 2.0  ?    ?I:C ratio      ? 0000 1:4  ?    ?    ?    ?    ?Sensitivity      ? 0000  15  ?    ?Goal      ? 0000  120  ?  ?  ? ?

## 2021-07-23 ENCOUNTER — Encounter: Payer: Self-pay | Admitting: Internal Medicine

## 2021-07-23 NOTE — Addendum Note (Signed)
Addended byAdalberto Cole on: 07/23/2021 01:05 PM ? ? Modules accepted: Orders ? ?

## 2021-08-02 ENCOUNTER — Telehealth: Payer: Self-pay | Admitting: Dietician

## 2021-08-02 NOTE — Telephone Encounter (Signed)
Returned patient call. ?She states that she has had problems with her Omnipod 5 insulin pump alarming at 3 am each morning despite being close by.  She states that she called the Omnipod tech support who think there is a problem with the PDM and are shipping a new one.  She states that she may need assistance setting up the new PDM. ? ?Patient to call as needed. ? ?Antonieta Iba, RD, LDN, CDCES ? ?

## 2021-08-03 ENCOUNTER — Other Ambulatory Visit: Payer: Self-pay | Admitting: Internal Medicine

## 2021-08-07 ENCOUNTER — Ambulatory Visit: Payer: Medicare Other | Admitting: Nutrition

## 2021-08-08 ENCOUNTER — Encounter (HOSPITAL_COMMUNITY): Payer: Medicare Other

## 2021-08-08 ENCOUNTER — Encounter (HOSPITAL_COMMUNITY): Payer: Self-pay | Admitting: Physical Therapy

## 2021-08-08 DIAGNOSIS — M79642 Pain in left hand: Secondary | ICD-10-CM | POA: Diagnosis not present

## 2021-08-08 NOTE — Therapy (Unsigned)
PHYSICAL THERAPY DISCHARGE SUMMARY ? ?Visits from Start of Care: 2 ? ?Current functional level related to goals / functional outcomes: ?Unknown at this time. Refer to most recent note on 07/19/21 ?  ?Remaining deficits: ?Unknown at this time. Refer to most recent note on 07/19/21 ?  ?Education / Equipment: ?Refer to most recent note on 07/19/21  ? ?Patient agrees to discharge. Patient goals were partially met. Patient is being discharged due to the patient's request.  ? ? ?4:22 PM,08/08/21 ?Adalberto Cole, DPT ?[facility]  ?

## 2021-08-10 ENCOUNTER — Encounter (HOSPITAL_COMMUNITY): Payer: Medicare Other

## 2021-08-13 ENCOUNTER — Encounter (HOSPITAL_COMMUNITY): Payer: Medicare Other

## 2021-08-15 ENCOUNTER — Encounter (HOSPITAL_COMMUNITY): Payer: Medicare Other

## 2021-08-16 ENCOUNTER — Other Ambulatory Visit: Payer: Self-pay | Admitting: Internal Medicine

## 2021-08-16 ENCOUNTER — Other Ambulatory Visit (HOSPITAL_COMMUNITY): Payer: Self-pay | Admitting: Internal Medicine

## 2021-08-17 DIAGNOSIS — E1149 Type 2 diabetes mellitus with other diabetic neurological complication: Secondary | ICD-10-CM | POA: Diagnosis not present

## 2021-08-17 DIAGNOSIS — E1165 Type 2 diabetes mellitus with hyperglycemia: Secondary | ICD-10-CM | POA: Diagnosis not present

## 2021-08-20 ENCOUNTER — Other Ambulatory Visit (HOSPITAL_COMMUNITY): Payer: Self-pay | Admitting: Internal Medicine

## 2021-08-20 ENCOUNTER — Encounter (HOSPITAL_COMMUNITY): Payer: Medicare Other | Admitting: Physical Therapy

## 2021-08-20 DIAGNOSIS — N644 Mastodynia: Secondary | ICD-10-CM

## 2021-08-20 DIAGNOSIS — Z1231 Encounter for screening mammogram for malignant neoplasm of breast: Secondary | ICD-10-CM

## 2021-08-22 ENCOUNTER — Encounter (HOSPITAL_COMMUNITY): Payer: Medicare Other | Admitting: Physical Therapy

## 2021-09-04 DIAGNOSIS — R197 Diarrhea, unspecified: Secondary | ICD-10-CM | POA: Diagnosis not present

## 2021-09-04 DIAGNOSIS — E1143 Type 2 diabetes mellitus with diabetic autonomic (poly)neuropathy: Secondary | ICD-10-CM | POA: Diagnosis not present

## 2021-09-04 DIAGNOSIS — R112 Nausea with vomiting, unspecified: Secondary | ICD-10-CM | POA: Diagnosis not present

## 2021-09-05 ENCOUNTER — Other Ambulatory Visit (HOSPITAL_COMMUNITY): Payer: Self-pay | Admitting: Internal Medicine

## 2021-09-05 DIAGNOSIS — N644 Mastodynia: Secondary | ICD-10-CM

## 2021-09-06 ENCOUNTER — Other Ambulatory Visit: Payer: Self-pay | Admitting: Family Medicine

## 2021-09-06 ENCOUNTER — Other Ambulatory Visit (HOSPITAL_COMMUNITY): Payer: Self-pay | Admitting: Family Medicine

## 2021-09-06 DIAGNOSIS — R197 Diarrhea, unspecified: Secondary | ICD-10-CM

## 2021-09-06 DIAGNOSIS — R112 Nausea with vomiting, unspecified: Secondary | ICD-10-CM

## 2021-09-11 ENCOUNTER — Other Ambulatory Visit (HOSPITAL_COMMUNITY): Payer: Self-pay | Admitting: Internal Medicine

## 2021-09-11 ENCOUNTER — Ambulatory Visit (HOSPITAL_COMMUNITY)
Admission: RE | Admit: 2021-09-11 | Discharge: 2021-09-11 | Disposition: A | Discharge status: Home / Self Care | Payer: Medicare Other | Source: Ambulatory Visit | Attending: Internal Medicine | Admitting: Internal Medicine

## 2021-09-11 ENCOUNTER — Encounter (HOSPITAL_COMMUNITY): Payer: Self-pay

## 2021-09-11 DIAGNOSIS — N644 Mastodynia: Secondary | ICD-10-CM

## 2021-09-12 ENCOUNTER — Ambulatory Visit (HOSPITAL_COMMUNITY)
Admission: RE | Admit: 2021-09-12 | Discharge: 2021-09-12 | Disposition: A | Discharge status: Home / Self Care | Payer: Medicare Other | Source: Ambulatory Visit | Attending: Family Medicine | Admitting: Family Medicine

## 2021-09-12 DIAGNOSIS — R112 Nausea with vomiting, unspecified: Secondary | ICD-10-CM | POA: Insufficient documentation

## 2021-09-12 DIAGNOSIS — R197 Diarrhea, unspecified: Secondary | ICD-10-CM | POA: Diagnosis not present

## 2021-09-12 DIAGNOSIS — R109 Unspecified abdominal pain: Secondary | ICD-10-CM | POA: Diagnosis not present

## 2021-09-12 DIAGNOSIS — R1110 Vomiting, unspecified: Secondary | ICD-10-CM | POA: Diagnosis not present

## 2021-09-12 MED ORDER — IOHEXOL 300 MG/ML  SOLN
100.0000 mL | Freq: Once | INTRAMUSCULAR | Status: AC | PRN
  Administered 2021-09-12: 100 mL via INTRAVENOUS

## 2021-09-18 DIAGNOSIS — E1165 Type 2 diabetes mellitus with hyperglycemia: Secondary | ICD-10-CM | POA: Diagnosis not present

## 2021-09-18 DIAGNOSIS — E1149 Type 2 diabetes mellitus with other diabetic neurological complication: Secondary | ICD-10-CM | POA: Diagnosis not present

## 2021-10-10 ENCOUNTER — Other Ambulatory Visit: Payer: Self-pay

## 2021-10-10 MED ORDER — OMNIPOD DASH PODS (GEN 4) MISC: 1 refills | Status: DC

## 2021-10-10 MED ORDER — OMNIPOD 5 DEXG7G6 PODS GEN 5 MISC: 1 refills | Status: DC

## 2021-10-11 ENCOUNTER — Ambulatory Visit (HOSPITAL_COMMUNITY)
Admission: RE | Admit: 2021-10-11 | Discharge: 2021-10-11 | Disposition: A | Discharge status: Home / Self Care | Payer: Medicare Other | Source: Ambulatory Visit | Attending: Cardiovascular Disease | Admitting: Cardiovascular Disease

## 2021-10-11 DIAGNOSIS — I739 Peripheral vascular disease, unspecified: Secondary | ICD-10-CM | POA: Insufficient documentation

## 2021-10-18 ENCOUNTER — Other Ambulatory Visit: Payer: Self-pay | Admitting: Family Medicine

## 2021-10-18 ENCOUNTER — Other Ambulatory Visit (HOSPITAL_COMMUNITY): Payer: Self-pay | Admitting: Family Medicine

## 2021-10-18 DIAGNOSIS — F17200 Nicotine dependence, unspecified, uncomplicated: Secondary | ICD-10-CM | POA: Diagnosis not present

## 2021-10-18 DIAGNOSIS — E1143 Type 2 diabetes mellitus with diabetic autonomic (poly)neuropathy: Secondary | ICD-10-CM | POA: Diagnosis not present

## 2021-10-18 DIAGNOSIS — Z1231 Encounter for screening mammogram for malignant neoplasm of breast: Secondary | ICD-10-CM | POA: Diagnosis not present

## 2021-10-18 DIAGNOSIS — E1165 Type 2 diabetes mellitus with hyperglycemia: Secondary | ICD-10-CM | POA: Diagnosis not present

## 2021-10-18 DIAGNOSIS — I739 Peripheral vascular disease, unspecified: Secondary | ICD-10-CM | POA: Diagnosis not present

## 2021-10-18 DIAGNOSIS — E1149 Type 2 diabetes mellitus with other diabetic neurological complication: Secondary | ICD-10-CM | POA: Diagnosis not present

## 2021-10-24 ENCOUNTER — Ambulatory Visit (HOSPITAL_COMMUNITY)
Admission: RE | Admit: 2021-10-24 | Discharge: 2021-10-24 | Disposition: A | Discharge status: Home / Self Care | Payer: Medicare Other | Source: Ambulatory Visit | Attending: Internal Medicine | Admitting: Internal Medicine

## 2021-10-24 DIAGNOSIS — Z1231 Encounter for screening mammogram for malignant neoplasm of breast: Secondary | ICD-10-CM | POA: Diagnosis not present

## 2021-10-24 DIAGNOSIS — N644 Mastodynia: Secondary | ICD-10-CM | POA: Insufficient documentation

## 2021-11-02 ENCOUNTER — Ambulatory Visit (INDEPENDENT_AMBULATORY_CARE_PROVIDER_SITE_OTHER): Payer: Medicare Other | Admitting: Surgery

## 2021-11-02 ENCOUNTER — Encounter: Payer: Self-pay | Admitting: Surgery

## 2021-11-02 VITALS — BP 137/83 | HR 87 | Temp 97.6°F | Resp 18 | Ht 64.0 in | Wt 144.4 lb

## 2021-11-02 DIAGNOSIS — I70213 Atherosclerosis of native arteries of extremities with intermittent claudication, bilateral legs: Secondary | ICD-10-CM

## 2021-11-02 NOTE — Progress Notes (Signed)
[facility]  Patient name: [name] MRN: [id number] DOB: 01/13/1956 Sex: female   REQUESTING PROVIDER:    Valentino Nose   REASON FOR CONSULT:    PAD  HISTORY OF PRESENT ILLNESS:   [name] is a 66 y.o. female who returns today for follow-up.  She is status post left femoral to popliteal artery bypass graft with PTFE on 06/09/2016 for lifestyle limiting claudication.  She had previously undergone atherectomy and angioplasty by Dr. Gwenlyn Found.  She is back today for follow-up and vascular lab studies.  She remains asymptomatic without signs or symptoms of claudication.  She does not have rest pain.  She does not have any open wounds.  She has stopped smoking.   The patient has a strong family history of cardiovascular disease, mainly cardiac in origin.  The patient has a history of stroke in the past with some mild left-sided symptoms.  She has a history of tobacco abuse, however recently stopped smoking.  She suffers and hypercholesterolemia which is managed with a statin.  She is medically managed for hypertension.  PAST MEDICAL HISTORY    Past Medical History:  Diagnosis Date   Allergic urticaria 07/10/2015   Allergy    Anemia    hx   Angioedema 07/10/2015   Anxiety    Arthritis    "neck, left hand" (09/14/2012)   Asthma    Cancer ([place]) 1985   ovarian, no treatment except surgery   Cataract    Complication of anesthesia    [place] TO RIGHT   Critical lower limb ischemia ([place])    10/2014 s/p L SFA stenting   Diverticulosis of colon with hemorrhage April 2013   GERD (gastroesophageal reflux disease)    GI bleed    H/O hiatal hernia    Heart attack ([place])    2003 mild MI, March 2013 mild MI   Hidradenitis    groin   History of blood transfusion 1985 AND 2013   Hyperlipidemia    Hypertension    Irritable bowel syndrome    Left-sided weakness    since stroke, left eye trouble  seeing   Migraines    Mild CAD    a. Cath 09/2010: mild luminal irregularities of LAD, 30% prox RCA and 20-30% mRCA, EF 65%.   Neuropathy    Obesity    Osteoporosis    PAD (peripheral artery disease) ([place])    a. critical limb ischemia s/p PTA/stenting of L SFA 10/2014. c. occ prior SFA stent by angio 01/2016, for possible PV bypass.   Pneumonia    baby   Recurrent upper respiratory infection (URI)    S/P arterial stent-mid Lt SFA 11/03/14 11/04/2014   Schatzki's ring    Sinus problem    Stomach ulcer 1972   non-bleeding   Stroke ([place]) 07-2007, 07-2008, 07-2009   total 3 strokes; mild left sided weakness and left eye "jumps".   Type II diabetes mellitus (HCC)    Vitamin B 12 deficiency 07-15-2013     FAMILY HISTORY   Family History  Problem Relation Age of Onset   Breast cancer Mother    Heart disease Mother    Hypertension Mother    Diabetes Father    Heart disease Father    Stroke Father    Heart disease Sister    Hypertension Sister    Hypertension Sister    Hyperlipidemia Sister    Diabetes Sister    Allergic rhinitis  Sister    Emphysema Other        great uncle   Aneurysm Sister        brain   Colon cancer Paternal Uncle    Angioedema Neg Hx    Asthma Neg Hx    Eczema Neg Hx    Immunodeficiency Neg Hx    Urticaria Neg Hx     SOCIAL HISTORY:   Social History   Socioeconomic History   Marital status: Divorced    Spouse name: Not on file   Number of children: 1   Years of education: some college   Highest education level: Some college, no degree  Occupational History   Occupation: Sport and exercise psychologist: UNEMPLOYED    Comment: disabled, notary  Tobacco Use   Smoking status: Every Day    Packs/day: 0.50    Years: 41.00    Total pack years: 20.50    Types: Cigarettes    Last attempt to quit: 03/01/2020    Years since quitting: 1.6   Smokeless tobacco: Never   Tobacco comments:    Getting ready to start nicotine patches RX by Dr. Gwenlyn Found per pt.   Vaping Use   Vaping Use: Never used  Substance and Sexual Activity   Alcohol use: No    Alcohol/week: 0.0 standard drinks of alcohol   Drug use: No    Types: "Crack" cocaine    Comment: 05/09/2015.  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  Divorced, living alone in [place].     Social Determinants of Health   Financial Resource Strain: Low Risk  (08/15/2020)   Overall Financial Resource Strain (CARDIA)    Difficulty of Paying Living Expenses: Not very hard  Food Insecurity: No Food Insecurity (08/15/2020)   Hunger Vital Sign    Worried About Running Out of Food in the Last Year: Never true    Ran Out of Food in the Last Year: Never true  Transportation Needs: No Transportation Needs (08/15/2020)   PRAPARE - Hydrologist (Medical): No    Lack of Transportation (Non-Medical): No  Physical Activity: Not on file  Stress: No Stress Concern Present (08/15/2020)   [place]    Feeling of Stress : Only a little  Social Connections: Moderately Integrated (08/15/2020)   Social Connection and Isolation Panel [NHANES]    Frequency of Communication with Friends and Family: More than three times a week    Frequency of Social Gatherings with Friends and Family: More than three times a week    Attends Religious Services: More than 4 times per year    Active Member of [employer] or Organizations: Yes    Attends Archivist Meetings: More than 4 times per year    Marital Status: Divorced  Intimate Partner Violence: Unknown (08/15/2020)   Humiliation, Afraid, Rape, and Kick questionnaire    Fear  of Current or Ex-Partner: Not on file    Emotionally Abused: No    Physically Abused: No    Sexually Abused: No    ALLERGIES:    Allergies  Allergen Reactions  Sulfa Antibiotics Shortness Of Breath and Palpitations   Codeine Other (See Comments)    Recovering Addict does not like to take Narcotics   Fish Allergy Hives and Swelling    Tongue swelling   Iodine Swelling   Metformin And Related Diarrhea   [name] [Insulin Glargine-Lixisenatide] Itching and Other (See Comments)    "tongue swelling"   Adhesive [Tape] Rash    Paper tape is ok   Shellfish Allergy Swelling and Rash    Tongue swelling    CURRENT MEDICATIONS:    Current Outpatient Medications  Medication Sig Dispense Refill   acetaminophen (TYLENOL) 325 MG tablet Take 2 tablets (650 mg total) by mouth every 6 (six) hours as needed for mild pain (or Fever >/= 101). 12 tablet 2   albuterol (PROAIR HFA) 108 (90 Base) MCG/ACT inhaler 1-2 inhalations every 4-6 hours as needed for cough or wheeze. (Patient taking differently: Inhale 1-2 puffs into the lungs every 4 (four) hours as needed for shortness of breath.) 1 Inhaler 1   ALPRAZolam (XANAX) 0.5 MG tablet Take 0.5 mg by mouth 2 (two) times daily as needed for anxiety.     Azelastine HCl 0.15 % SOLN Place 2 sprays into both nostrils 2 (two) times daily. (Patient taking differently: Place 2 sprays into both nostrils 2 (two) times daily as needed (allergies).) 30 mL 5   bisacodyl (DULCOLAX) 5 MG EC tablet Take 5 mg by mouth daily as needed for moderate constipation.     citalopram (CELEXA) 10 MG tablet Take 10 mg by mouth daily as needed.      citalopram (CELEXA) 20 MG tablet Take 20 mg by mouth daily.     clopidogrel (PLAVIX) 75 MG tablet Take 1 tablet (75 mg total) by mouth daily. 30 tablet 2   Continuous Blood Gluc Receiver ([place]) DEVI by Does not apply route.     D3-50 1.25 MG (50000 UT) capsule Take 50,000 Units by mouth every 7 (seven) days.      empagliflozin (JARDIANCE) 25 MG TABS tablet Take 1 tablet (25 mg total) by mouth daily before breakfast. 90 tablet 3   EPINEPHrine 0.3 mg/0.3 mL IJ SOAJ injection Inject 0.3 mLs (0.3 mg total) into the muscle once. 1 Device 1   Fluocinolone Acetonide Body 0.01 % OIL Apply topically 2 (two) times daily.     fluticasone (FLONASE) 50 MCG/ACT nasal spray SPRAY 2 SPRAYS INTO EACH NOSTRIL EVERY DAY     Insulin Disposable Pump (OMNIPOD 5 G6 INTRO, GEN 5,) KIT 1 Device by Does not apply route every 3 (three) days. 1 kit 0   Insulin Disposable Pump (OMNIPOD 5 G6 POD, GEN 5,) MISC Change every 72 hours 35 each 1   insulin lispro (HUMALOG) 100 UNIT/ML injection MAX DAILY 100 UNITS PER OMNIPOD 100 mL 3   levocetirizine (XYZAL) 5 MG tablet Take 5 mg by mouth daily.     loperamide (IMODIUM) 2 MG capsule Take 2 mg by mouth daily.     losartan-hydrochlorothiazide (HYZAAR) 50-12.5 MG tablet Take 1 tablet by mouth daily. 90 tablet 3   lubiprostone (AMITIZA) 24 MCG capsule Take 1 capsule (24 mcg total) by mouth 2 (two) times daily with a meal. 60 capsule 1   Magnesium 200 MG TABS Take 1 tablet (200 mg total) by mouth daily. 30 tablet 6   meloxicam (MOBIC) 15 MG tablet Take 1 tablet (15 mg total) by mouth daily. Toxic effects may be increased with concurrent administration of NSAIDs and Selective  Serotonin Reuptake Inhibitors. The risk of upper gastrointestinal bleeding may be increased. Patients taking both drugs concurrently should be educated about the signs and symptoms of GI bleeding. 20 tablet 0   metoCLOPramide (REGLAN) 5 MG tablet Take 5 mg by mouth 3 (three) times daily.     mupirocin ointment (BACTROBAN) 2 % Apply 1 application topically 2 (two) times daily. (Patient taking differently: Apply 1 application  topically 2 (two) times daily as needed.) 30 g 1   nitroGLYCERIN (NITROSTAT) 0.4 MG SL tablet PLACE 1 TAB UNDER TONGUE EVERY 5 MINS AS NEEDED FOR CHEST PAIN - MAX 3 DOSES THEN 911 25 tablet 2   pantoprazole  (PROTONIX) 40 MG tablet TAKE 1 TABLET BY MOUTH EVERY DAY (Patient taking differently: Take 40 mg by mouth daily.) 30 tablet 11   polyethylene glycol (MIRALAX / GLYCOLAX) 17 g packet Take 17 g by mouth daily as needed for moderate constipation or severe constipation.     potassium chloride (KLOR-CON) 10 MEQ tablet Take 10 mEq by mouth daily.     propranolol ER (INDERAL LA) 60 MG 24 hr capsule Take 1 capsule (60 mg total) by mouth every evening. 90 capsule 3   rosuvastatin (CRESTOR) 40 MG tablet Take 1 tablet (40 mg total) by mouth every morning. 90 tablet 3   SUMAtriptan (IMITREX) 100 MG tablet Take 100 mg by mouth every 2 (two) hours as needed for migraine.      temazepam (RESTORIL) 15 MG capsule Take 15 mg by mouth at bedtime as needed.     temazepam (RESTORIL) 30 MG capsule Take 30 mg by mouth at bedtime as needed for sleep.     traMADol (ULTRAM) 50 MG tablet Take 50 mg by mouth 3 (three) times daily as needed.     traZODone (DESYREL) 100 MG tablet Take 1 tablet (100 mg total) by mouth at bedtime. 30 tablet 0   Vitamin D, Ergocalciferol, (DRISDOL) 1.25 MG (50000 UNIT) CAPS capsule Take 50,000 Units by mouth every 7 (seven) days.     doxycycline (VIBRAMYCIN) 100 MG capsule Take 1 capsule (100 mg total) by mouth 2 (two) times daily. (Patient not taking: Reported on 07/20/2021) 14 capsule 0   No current facility-administered medications for this visit.    REVIEW OF SYSTEMS:   _0  denotes positive finding, _1  denotes negative finding Cardiac  Comments:  Chest pain or chest pressure:    Shortness of breath upon exertion:    Short of breath when lying flat:    Irregular heart rhythm:        Vascular    Pain in calf, thigh, or hip brought on by ambulation:    Pain in feet at night that wakes you up from your sleep:     Blood clot in your veins:    Leg swelling:         Pulmonary    Oxygen at home:    Productive cough:     Wheezing:         Neurologic    Sudden weakness in arms or  legs:     Sudden numbness in arms or legs:     Sudden onset of difficulty speaking or slurred speech:    Temporary loss of vision in one eye:     Problems with dizziness:         Gastrointestinal    Blood in stool:      Vomited blood:         Genitourinary  Burning when urinating:     Blood in urine:        Psychiatric    Major depression:         Hematologic    Bleeding problems:    Problems with blood clotting too easily:        Skin    Rashes or ulcers:        Constitutional    Fever or chills:     PHYSICAL EXAM:   Vitals:   11/02/21 1358  BP: 137/83  Pulse: 87  Resp: 18  Temp: 97.6 F (36.4 C)  TempSrc: Oral  SpO2: 97%  Weight: 144 lb 6.4 oz (65.5 kg)  Height: _0  (1.626 m)    GENERAL: The patient is a well-nourished female, in no acute distress. The vital signs are documented above. CARDIAC: There is a regular rate and rhythm.  VASCULAR: Palpable right dorsalis pedis pulse.  I could not palpate left pedal pulses PULMONARY: Nonlabored respirations MUSCULOSKELETAL: There are no major deformities or cyanosis. NEUROLOGIC: No focal weakness or paresthesias are detected. SKIN: There are no ulcers or rashes noted. PSYCHIATRIC: The patient has a normal affect.  STUDIES:   I have reviewed the following: +-------+-----------+-----------+------------+------------+  ABI/TBIToday's ABIToday's TBIPrevious ABIPrevious TBI  +-------+-----------+-----------+------------+------------+  Right  0.87       0.38       1.04        0.31          +-------+-----------+-----------+------------+------------+  Left   1.07       0.87       1.02        0.40          +-------+-----------+-----------+------------+------------+  Right toe pressure: 57 Left toe pressure: 131   DUPLEX: Left: Left femoral to above-knee popliteal bypass graft is patent with  50-70%  stenosis in proximal anastomis/proximal graft.  ASSESSMENT and PLAN   PAD: The patient's  left femoral-popliteal bypass graft remains patent.  There is evidence of narrowing at the proximal and distal anastomotic sites.  She has not had any change in her ABIs.  Velocity profiles are all less than 250.  I discussed these findings with the patient and I have recommended that she return in 1 year for follow-up.  If her velocity profile increases, she may need intervention.  She knows to contact me if she develops any change in symptoms.  With regards to the right leg, she has had a slight drop in her ABIs.  Her toe pressures are abnormal, however she is asymptomatic   Annamarie Major, IV, MD, FACS [facility] [phone] Pager [phone]

## 2021-11-12 DIAGNOSIS — M25551 Pain in right hip: Secondary | ICD-10-CM | POA: Diagnosis not present

## 2021-11-12 DIAGNOSIS — M25552 Pain in left hip: Secondary | ICD-10-CM | POA: Diagnosis not present

## 2021-11-14 ENCOUNTER — Ambulatory Visit (INDEPENDENT_AMBULATORY_CARE_PROVIDER_SITE_OTHER): Payer: Medicare Other | Admitting: Internal Medicine

## 2021-11-14 ENCOUNTER — Encounter: Payer: Self-pay | Admitting: Internal Medicine

## 2021-11-14 VITALS — BP 140/84 | HR 64 | Ht 64.0 in | Wt 143.0 lb

## 2021-11-14 DIAGNOSIS — E1159 Type 2 diabetes mellitus with other circulatory complications: Secondary | ICD-10-CM | POA: Diagnosis not present

## 2021-11-14 DIAGNOSIS — E1165 Type 2 diabetes mellitus with hyperglycemia: Secondary | ICD-10-CM | POA: Diagnosis not present

## 2021-11-14 DIAGNOSIS — Z794 Long term (current) use of insulin: Secondary | ICD-10-CM

## 2021-11-14 DIAGNOSIS — E1142 Type 2 diabetes mellitus with diabetic polyneuropathy: Secondary | ICD-10-CM | POA: Diagnosis not present

## 2021-11-14 LAB — POCT GLYCOSYLATED HEMOGLOBIN (HGB A1C): Hemoglobin A1C: 8.2 % — AB (ref 4.0–5.6)

## 2021-11-14 MED ORDER — OMNIPOD 5 DEXG7G6 PODS GEN 5 MISC: 1.0000 | 3 refills | Status: DC

## 2021-11-14 NOTE — Progress Notes (Unsigned)
Name: [name]  Age/ Sex: 66 y.o., female   MRN/ DOB: [id number], 1956-02-18     PCP: [name], MD   Reason for Endocrinology Evaluation: Type 2 Diabetes Mellitus  Initial Endocrine Consultative Visit: 04/17/2020    PATIENT IDENTIFIER: [name] is a 66 y.o. female with a past medical history of T2DM, PAD ,CAD, HTN and Hx of pancreatitis  . The patient has followed with Endocrinology clinic since 04/17/2020 for consultative assistance with management of her diabetes.  DIABETIC HISTORY:  [name] was diagnosed with DM in 2003, She is intolerant to Metformin due to diarrhea and Soliqua due to itching . Her hemoglobin A1c has ranged from 7.1%  in 20, peaking at 9.5% in 2015  Has chronic GI issues - Dr Carlean Purl   Islet Ab's and GAD-65 negative   Jardiance started 07/2020 Omnipod started 08/2020   SUBJECTIVE:   During the last visit (07/20/2021) :  A1c 8.7%, we adjusted pump setting and and continued Jardiance      Today (11/14/2021): [name] is here for diabetes follow up .  She checks her blood sugars multiple times a day through CGM. The patient has not had hypoglycemic episodes since the last clinic visit.  She was seen by vascular on 11/02/2021 for PAD, patent F-P bypass graft She continues with heartburn symptoms  This patient with type 2 diabetes is treated with Omnipod (insulin pump). During the visit the pump basal and bolus doses were reviewed including carb/insulin rations and supplemental doses. The clinical list was updated. The glucose meter download was reviewed in detail to determine if the current pump settings are providing the best glycemic control without excessive hypoglycemia.     Pump and meter download:    Pump   Omnipod Settings   Insulin type   Humalog    Basal rate       0000 -0400 1.60 u/h    0400-0800 1.60   0800-2200 2.05   2200-0000 2.0      I:C ratio       0000 1:4                  Sensitivity       0000   15      Goal       0000  120           Type & Model of Pump: Omnipod Insulin Type: Currently using Humalog .     PUMP STATISTICS: Average BG: 170  Average Daily Carbs (g): 24.2  Average Total Daily Insulin: 55.5  Average Daily Basal: 42.3 (76 %) Average Daily Bolus: 13.2 (24 %)     HOME DIABETES REGIMEN:  Humalog Jardiance 25 mg daily     Statin: yes ACE-I/ARB: yes      DIABETIC COMPLICATIONS: Microvascular complications:  Neuropathy  Denies: CKD, retinopathy Last Eye Exam: Completed 12/26/2020  Macrovascular complications:  PVD ( S/P femoral-popliteal bypass), Mild CAD  Denies: CAD, CVA   HISTORY:  Past Medical History:  Past Medical History:  Diagnosis Date   Allergic urticaria 07/10/2015   Allergy    Anemia    hx   Angioedema 07/10/2015   Anxiety    Arthritis    "neck, left hand" (09/14/2012)   Asthma    Cancer ([place]) 1985   ovarian, no treatment except surgery   Cataract    Complication of anesthesia    OCCASIONAL TROUBLE TURNING NECK TO RIGHT   Critical lower limb  ischemia ([place])    10/2014 s/p L SFA stenting   Diverticulosis of colon with hemorrhage April 2013   GERD (gastroesophageal reflux disease)    GI bleed    H/O hiatal hernia    Heart attack [facility])    2003 mild MI, March 2013 mild MI   Hidradenitis    groin   History of blood transfusion 1985 AND 2013   Hyperlipidemia    Hypertension    Irritable bowel syndrome    Left-sided weakness    since stroke, left eye trouble seeing   Migraines    Mild CAD    a. Cath 09/2010: mild luminal irregularities of LAD, 30% prox RCA and 20-30% mRCA, EF 65%.   Neuropathy    Obesity    Osteoporosis    PAD (peripheral artery disease) ([place])    a. critical limb ischemia s/p PTA/stenting of L SFA 10/2014. c. occ prior SFA stent by angio 01/2016, for possible PV bypass.   Pneumonia    baby   Recurrent upper respiratory infection (URI)    S/P arterial stent-mid Lt SFA 11/03/14 11/04/2014    Schatzki's ring    Sinus problem    Stomach ulcer 1972   non-bleeding   Stroke ([place]) 07-2007, 07-2008, 07-2009   total 3 strokes; mild left sided weakness and left eye "jumps".   Type II diabetes mellitus ([place])    Vitamin B 12 deficiency 07-15-2013   Past Surgical History:  Past Surgical History:  Procedure Laterality Date   ABDOMINAL HYSTERECTOMY  1985   ADENOIDECTOMY     ANTERIOR CERVICAL DECOMP/DISCECTOMY FUSION  2002   ANTERIOR CERVICAL DECOMP/DISCECTOMY FUSION N/A 04/08/2014   Procedure: Cervical Six-Seven ANTERIOR CERVICAL DECOMPRESSION/DISCECTOMY FUSION Plating and Bonegraft  2 LEVELS;  Surgeon: Ashok Pall, MD;  Location: [facility]URO ORS;  Service: Neurosurgery;  Laterality: N/A;  Cervical Six-Seven ANTERIOR CERVICAL DECOMPRESSION/DISCECTOMY FUSION Plating and Bonegraft  2 LEVELS   APPENDECTOMY  1985   AXILLARY HIDRADENITIS EXCISION  1990-2008   bilateral   BACK SURGERY     BREAST BIOPSY Right 2007   BREAST CYST EXCISION Right 2008   BREAST REDUCTION SURGERY     CARDIAC CATHETERIZATION  2004   mild disease   CATARACT EXTRACTION W/PHACO Left 05/16/2015   Procedure: CATARACT EXTRACTION PHACO AND INTRAOCULAR LENS PLACEMENT (IOC);  Surgeon: Rutherford Guys, MD;  Location: [facility];  Service: Ophthalmology;  Laterality: Left;  CDE: 4.24   CATARACT EXTRACTION W/PHACO Right 05/30/2015   Procedure: CATARACT EXTRACTION RIGHT EYE PHACO AND INTRAOCULAR LENS PLACEMENT ;  Surgeon: Rutherford Guys, MD;  Location: [facility];  Service: Ophthalmology;  Laterality: Right;  CDE:4.08   CHOLECYSTECTOMY  1990's   COLONOSCOPY  08/12/2011   Procedure: COLONOSCOPY;  Surgeon: Ladene Artist, MD,FACG;  Location[facility] END[facility]COPY;  Service: Endoscopy;  Laterality: N/A;   COLONOSCOPY  06/19/2006   COLONOSCOPY WITH PROPOFOL N/A 07/02/2019   Procedure: COLONOSCOPY WITH PROPOFOL;  Surgeon: Rogene Houston, MD;  [facility];  Service: Endoscopy;  Laterality: N/A;   cyst thigh Right    ESOPHAGOGASTRODUODENOSCOPY   08/12/2011   Procedure: ESOPHAGOGASTRODUODENOSCOPY (EGD);  Surgeon: Ladene Artist, MD,FACG;  Location: [facility] ENDOSCOPY;  Service: Endoscopy;  Laterality: N/A;   ESOPHAGOGASTRODUODENOSCOPY  06/04/2005   ESOPHAGOGASTRODUODENOSCOPY (EGD) WITH PROPOFOL N/A 07/01/2019   Procedure: ESOPHAGOGASTRODUODENOSCOPY (EGD) WITH PROPOFOL;  Surgeon: Rogene Houston, MD;  [facility];  Service: Endoscopy;  Laterality: N/A;   FEMORAL-POPLITEAL BYPASS GRAFT Left 06/19/2016   Procedure: Left Leg BYPASS  GRAFT FEMORAL-POPLITEAL ARTERY;  Surgeon: Serafina Mitchell, MD;  Location: [facility];  Service: Vascular;  Laterality: Left;   GIVENS CAPSULE STUDY  08/13/2011   Procedure: GIVENS CAPSULE STUDY;  Surgeon: Ladene Artist, MD,FACG;  Location: [facility] ENDOSCOPY;  Service: Endoscopy;  Laterality: N/A;   HAMMER TOE SURGERY Bilateral ~ 2000   HEMOSTASIS CLIP PLACEMENT  07/02/2019   Procedure: HEMOSTASIS CLIP PLACEMENT;  Surgeon: Rogene Houston, MD;  [facility];  Service: Endoscopy;;  hepatic flexure   HIATAL HERNIA REPAIR     HYDRADENITIS EXCISION  01/2011; 03/2012   'groin and abdomen; 03/2012" (09/14/2012)   HYDRADENITIS EXCISION  04/01/2012   Procedure: EXCISION HYDRADENITIS GROIN;  Surgeon: Pedro Earls, MD;  Location: [facility];  Service: General;  Laterality: Bilateral;  Excision of Hydradenitis of Perineum   HYDRADENITIS EXCISION N/A 09/17/2013   Procedure: EXCISION PERINEAL HIDRADENITIS ;  Surgeon: Pedro Earls, MD;  Location: [facility];  Service: General;  Laterality: N/A;  also in the pubis area   LEFT HEART CATH AND CORONARY ANGIOGRAPHY N/A 12/26/2016   Procedure: LEFT HEART CATH AND CORONARY ANGIOGRAPHY;  Surgeon: [name], Peter M, MD;  Location: [place] CV LAB;  Service: Cardiovascular;  Laterality: N/A;   MASS EXCISION Right 09/17/2013   Procedure: EXCISION MASS;  Surgeon: Pedro Earls, MD;  Location: [facility];  Service: General;  Laterality: Right;   NISSEN FUNDOPLICATION  1027   PERIPHERAL  VASCULAR CATHETERIZATION N/A 11/03/2014   Procedure: Lower Extremity Angiography;  Surgeon: Lorretta Harp, MD;  Location: [place] CV LAB;  Service: Cardiovascular;  Laterality: N/A;   PERIPHERAL VASCULAR CATHETERIZATION N/A 01/29/2016   Procedure: Lower Extremity Angiography;  Surgeon: Lorretta Harp, MD;  Location: [facility] CV LAB;  Service: Cardiovascular;  Laterality: N/A;   POLYPECTOMY  07/02/2019   Procedure: POLYPECTOMY;  Surgeon: Rogene Houston, MD;  [facility];  Service: Endoscopy;;   POSTERIOR LUMBAR FUSION  2008 X 2   REDUCTION MAMMAPLASTY  1996?   TONSILLECTOMY AND ADENOIDECTOMY  1959 AND 2000   UVULOPALATOPHARYNGOPLASTY, TONSILLECTOMY AND SEPTOPLASTY  2000's   Social History:  reports that she has been smoking cigarettes. She has a 20.50 pack-year smoking history. She has never used smokeless tobacco. She reports that she does not drink alcohol and does not use drugs. Family History:  Family History  Problem Relation Age of Onset   Breast cancer Mother    Heart disease Mother    Hypertension Mother    Diabetes Father    Heart disease Father    Stroke Father    Heart disease Sister    Hypertension Sister    Hypertension Sister    Hyperlipidemia Sister    Diabetes Sister    Allergic rhinitis Sister    Emphysema Other        great uncle   Aneurysm Sister        brain   Colon cancer Paternal Uncle    Angioedema Neg Hx    Asthma Neg Hx    Eczema Neg Hx    Immunodeficiency Neg Hx    Urticaria Neg Hx      HOME MEDICATIONS: Allergies as of 11/14/2021       Reactions   Sulfa Antibiotics Shortness Of Breath, Palpitations   Codeine Other (See Comments)   Recovering Addict does not like to take Narcotics   Fish Allergy Hives, Swelling   Tongue swelling   Iodine Swelling   Metformin And Related  Diarrhea   Soliqua [insulin Glargine-lixisenatide] Itching, Other (See Comments)   "tongue swelling"   Adhesive [tape] Rash   Paper tape is ok    Shellfish Allergy Swelling, Rash   Tongue swelling        Medication List        Accurate as of November 14, 2021  1:26 PM. If you have any questions, ask your nurse or doctor.          acetaminophen 325 MG tablet Commonly known as: TYLENOL Take 2 tablets (650 mg total) by mouth every 6 (six) hours as needed for mild pain (or Fever >/= 101).   albuterol 108 (90 Base) MCG/ACT inhaler Commonly known as: ProAir HFA 1-2 inhalations every 4-6 hours as needed for cough or wheeze. What changed:  how much to take how to take this when to take this reasons to take this additional instructions   ALPRAZolam 0.5 MG tablet Commonly known as: XANAX Take 0.5 mg by mouth 2 (two) times daily as needed for anxiety.   Azelastine HCl 0.15 % Soln Place 2 sprays into both nostrils 2 (two) times daily. What changed:  when to take this reasons to take this   bisacodyl 5 MG EC tablet Commonly known as: DULCOLAX Take 5 mg by mouth daily as needed for moderate constipation.   citalopram 10 MG tablet Commonly known as: CELEXA Take 10 mg by mouth daily as needed.   citalopram 20 MG tablet Commonly known as: CELEXA Take 20 mg by mouth daily.   clopidogrel 75 MG tablet Commonly known as: PLAVIX Take 1 tablet (75 mg total) by mouth daily.   D3-50 1.25 MG (50000 UT) capsule Generic drug: Cholecalciferol Take 50,000 Units by mouth every 7 (seven) days.   Dexcom G6 Receiver Devi by Does not apply route.   doxycycline 100 MG capsule Commonly known as: VIBRAMYCIN Take 1 capsule (100 mg total) by mouth 2 (two) times daily.   empagliflozin 25 MG Tabs tablet Commonly known as: Jardiance Take 1 tablet (25 mg total) by mouth daily before breakfast.   EPINEPHrine 0.3 mg/0.3 mL Soaj injection Commonly known as: EPI-PEN Inject 0.3 mLs (0.3 mg total) into the muscle once.   Fluocinolone Acetonide Body 0.01 % Oil Apply topically 2 (two) times daily.   fluticasone 50 MCG/ACT nasal  spray Commonly known as: FLONASE SPRAY 2 SPRAYS INTO EACH NOSTRIL EVERY DAY   insulin lispro 100 UNIT/ML injection Commonly known as: HUMALOG MAX DAILY 100 UNITS PER OMNIPOD   levocetirizine 5 MG tablet Commonly known as: XYZAL Take 5 mg by mouth daily.   loperamide 2 MG capsule Commonly known as: IMODIUM Take 2 mg by mouth daily.   losartan-hydrochlorothiazide 50-12.5 MG tablet Commonly known as: Hyzaar Take 1 tablet by mouth daily.   lubiprostone 24 MCG capsule Commonly known as: AMITIZA Take 1 capsule (24 mcg total) by mouth 2 (two) times daily with a meal.   Magnesium 200 MG Tabs Take 1 tablet (200 mg total) by mouth daily.   meloxicam 15 MG tablet Commonly known as: Mobic Take 1 tablet (15 mg total) by mouth daily. Toxic effects may be increased with concurrent administration of NSAIDs and Selective Serotonin Reuptake Inhibitors. The risk of upper gastrointestinal bleeding may be increased. Patients taking both drugs concurrently should be educated about the signs and symptoms of GI bleeding.   metoCLOPramide 5 MG tablet Commonly known as: REGLAN Take 5 mg by mouth 3 (three) times daily.   mupirocin ointment 2 %  Commonly known as: BACTROBAN Apply 1 application topically 2 (two) times daily. What changed:  when to take this reasons to take this   nitroGLYCERIN 0.4 MG SL tablet Commonly known as: NITROSTAT PLACE 1 TAB UNDER TONGUE EVERY 5 MINS AS NEEDED FOR CHEST PAIN - MAX 3 DOSES THEN 911   Omnipod 5 G6 Intro (Gen 5) Kit 1 Device by Does not apply route every 3 (three) days.   Omnipod 5 G6 Pod (Gen 5) Misc Change every 72 hours   pantoprazole 40 MG tablet Commonly known as: PROTONIX TAKE 1 TABLET BY MOUTH EVERY DAY   polyethylene glycol 17 g packet Commonly known as: MIRALAX / GLYCOLAX Take 17 g by mouth daily as needed for moderate constipation or severe constipation.   potassium chloride 10 MEQ tablet Commonly known as: KLOR-CON Take 10 mEq by  mouth daily.   propranolol ER 60 MG 24 hr capsule Commonly known as: INDERAL LA Take 1 capsule (60 mg total) by mouth every evening.   rosuvastatin 40 MG tablet Commonly known as: CRESTOR Take 1 tablet (40 mg total) by mouth every morning.   SUMAtriptan 100 MG tablet Commonly known as: IMITREX Take 100 mg by mouth every 2 (two) hours as needed for migraine.   temazepam 30 MG capsule Commonly known as: RESTORIL Take 30 mg by mouth at bedtime as needed for sleep.   temazepam 15 MG capsule Commonly known as: RESTORIL Take 15 mg by mouth at bedtime as needed.   traMADol 50 MG tablet Commonly known as: ULTRAM Take 50 mg by mouth 3 (three) times daily as needed.   traZODone 100 MG tablet Commonly known as: DESYREL Take 1 tablet (100 mg total) by mouth at bedtime.   Vitamin D (Ergocalciferol) 1.25 MG (50000 UNIT) Caps capsule Commonly known as: DRISDOL Take 50,000 Units by mouth every 7 (seven) days.         OBJECTIVE:   Vital Signs: BP 140/84 (BP Location: Left Arm, Patient Position: Sitting, Cuff Size: Small)   Pulse 64   Ht 5' 4"  (1.626 m)   Wt 143 lb (64.9 kg)   SpO2 95%   BMI 24.55 kg/m   Wt Readings from Last 3 Encounters:  11/14/21 143 lb (64.9 kg)  11/02/21 144 lb 6.4 oz (65.5 kg)  07/20/21 145 lb 12.8 oz (66.1 kg)     Exam: General: Pt appears well and is in NAD  Lungs: Clear with good BS bilat with no rales, rhonchi, or wheezes  Heart: RRR with normal S1 and S2 and no gallops; no murmurs; no rub  Extremities: No pretibial edema.  Neuro: MS is good with appropriate affect, pt is alert and Ox3      DM foot exam: 03/19/2021   The skin of the feet is intact without sores or ulcerations. The pedal pulses are 1 + bilaterally  The sensation is intact to a screening 5.07, 10 gram monofilament bilaterally      DATA REVIEWED:    07/12/2021 BUN/Cr 11/0.89 GFR 71 Tg 74 HDL 68 LDL 77    Latest Reference Range & Units 06/15/21 16:22  Sodium 135  - 146 mmol/L 141  Potassium 3.5 - 5.3 mmol/L 4.0  Chloride 98 - 110 mmol/L 104  CO2 20 - 32 mmol/L 25  Glucose 65 - 99 mg/dL 182 (H)  BUN 7 - 25 mg/dL 12  Creatinine 0.50 - 1.05 mg/dL 0.94  Calcium 8.6 - 10.4 mg/dL 10.6 (H)  BUN/Creatinine Ratio 6 - 22 (calc)  NOT APPLICABLE    Latest Reference Range & Units 06/15/21 16:22  Total CHOL/HDL Ratio <5.0 (calc) 2.9  Cholesterol <200 mg/dL 126  HDL Cholesterol > OR = 50 mg/dL 44 (L)  LDL Cholesterol (Calc) mg/dL (calc) 62  MICROALB/CREAT RATIO <30 mcg/mg creat 48 (H)  Non-HDL Cholesterol (Calc) <130 mg/dL (calc) 82  Triglycerides <150 mg/dL 118    Latest Reference Range & Units 06/15/21 16:22  Glucose 65 - 99 mg/dL 182 (H)    Latest Reference Range & Units 06/15/21 16:22  Microalb, Ur mg/dL 2.6  MICROALB/CREAT RATIO <30 mcg/mg creat 48 (H)  Creatinine, Urine 20 - 275 mg/dL 54     12/23/2020 A1c 8.4%  08/22/2020 BUN 12 Creatinine 0.780 TG 191 LDL 56  Glutamic Acid Decarb Ab <5 IU/mL <5     ISLET CELL ANTIBODY SCREEN Negative  ASSESSMENT / PLAN / RECOMMENDATIONS:   1) Type 2 Diabetes Mellitus, with poorly controlled, 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 Goal A1c < 7.0 %.     -We have reviewed pump/CGM download today and most of her BG's have been optimal except when she eats, patient has been noted with postprandial hyperglycemia, patient admits to underestimates CHO entry due to fear of hypoglycemia, I am going to adjust her insulin to carb ratio and I did explain to her that I want her to be comfortable entering the correct amount of CHO so we can make the proper adjustments - Has Hx of pancreatitis in 2010, so DPP-4 inhibitors and GLp-1 agonists are  CONTRAINDICATED - GAD-65 and Islet cell Ab's negative  -Insulin to carb ratio has been changed from 4 to 8    MEDICATIONS:  continue  Jardiance 25 mg daily  Humalog   Pump   Omnipod Settings   Insulin type    Humalog    Basal rate       0000 -0400 1.60 u/h    0400-0800 1.60   0800-2200 2.05   2200-0000 2.0      I:C ratio       0000 1:8                  Sensitivity       0000  15      Goal       0000  120         EDUCATION / INSTRUCTIONS: BG monitoring instructions: Patient is instructed to check her blood sugars 4 times a day, before meals and bedtime . Call [facility] if: BG persistently < 70  I reviewed the Rule of 15 for the treatment of hypoglycemia in detail with the patient. Literature supplied.   2) Diabetic complications:  Eye: Does not have known diabetic retinopathy.  Neuro/ Feet: Does  have known diabetic peripheral neuropathy .  Renal: Patient does not have known baseline CKD.    3) Dyslipidemia:  - LDL has been at goal with the latest reading of 62 mg/DL in February 2023 - No changes at this time    Medication Continue rosuvastatin 40 mg daily    4) Microalbuminuria :   -  MA/Cr ratio slightly elevated at 48 in February 2023.  - She is on Losartan- HCTZ  -We will continue to monitor     F/U 6 months   Signed electronically by: Mack Guise, MD  [facility]  [address] [place], [place] [address] Phone: [phone]  FAX: [phone]   CC: [name], MD [place] [address] Phone: [phone]  Fax: [phone]  Return to Endocrinology clinic as below: Future Appointments  Date Time Provider [place]  11/14/2021  1:40 PM Geordan Xu, Melanie Crazier, MD [facility] None

## 2021-11-17 DIAGNOSIS — E1165 Type 2 diabetes mellitus with hyperglycemia: Secondary | ICD-10-CM | POA: Diagnosis not present

## 2021-11-17 DIAGNOSIS — E1149 Type 2 diabetes mellitus with other diabetic neurological complication: Secondary | ICD-10-CM | POA: Diagnosis not present

## 2021-11-23 ENCOUNTER — Telehealth: Payer: Self-pay

## 2021-11-23 NOTE — Telephone Encounter (Signed)
Printed

## 2021-11-23 NOTE — Telephone Encounter (Addendum)
Patient states that her blood sugar having been dropping in the morning and going high in the afternoon. Report has been placed on your desk.

## 2021-11-26 ENCOUNTER — Telehealth: Payer: Self-pay

## 2021-11-26 NOTE — Telephone Encounter (Signed)
Pt. To come in on her lunch hour for help with making changes to her basal rate 1:00PM: patient was shown how to make the changes to  her basal rates.  She changed the basal rate from MN to 4 AM from 1.6u to 1.4u/hr.  Questions were answered about how to treat lows with reference to CGM readings.

## 2021-11-26 NOTE — Telephone Encounter (Signed)
Patient needs help adjusting her basal rate on her pump. She will be over this way today and wants to know if she can stop by to get help changing the setting

## 2021-11-26 NOTE — Telephone Encounter (Signed)
Patient is aware of INR results. Verified current dose of 4 mg every Tue, Thu, Sat; 6 mg all other days. Verified patient findings, no complaints. Patient is aware to recheck in 1 week.   Charge Captured.

## 2021-12-04 ENCOUNTER — Encounter: Payer: Medicare Other | Attending: Internal Medicine | Admitting: Nutrition

## 2021-12-11 NOTE — Progress Notes (Signed)
Patient is here today to link her Dexcom to [place].  This was not done at pump start due to phone issues with Dexcom.  Pt.'s dexcom app was linked to [facility].

## 2021-12-12 ENCOUNTER — Other Ambulatory Visit: Payer: Self-pay | Admitting: *Deleted

## 2021-12-12 DIAGNOSIS — E1165 Type 2 diabetes mellitus with hyperglycemia: Secondary | ICD-10-CM | POA: Diagnosis not present

## 2021-12-12 DIAGNOSIS — E1149 Type 2 diabetes mellitus with other diabetic neurological complication: Secondary | ICD-10-CM | POA: Diagnosis not present

## 2021-12-12 NOTE — Patient Outreach (Signed)
  Care Coordination   Initial Visit Note   12/12/2021 Name: AZRIELLE SPRINGSTEEN MRN: [id number] DOB: [birth year]  [name] is a 66 y.o. year old female who sees Nevada Crane, [name], MD for primary care. I spoke with  [name] by phone today  What matters to the patients health and wellness today?  I would like to get my A1C down (current A1C is 8.2)    Goals Addressed             This Visit's Progress    A1C <8.2          SDOH assessments and interventions completed:  Yes     Care Coordination Interventions Activated:  Yes  Care Coordination Interventions:  Yes, provided   Follow up plan:  Scheduled call with Care Coordinator Joellyn Quails  on [phone] 2:30 pm  Encounter Outcome:  Pt. Visit Completed  [facility] [phone]

## 2021-12-27 DIAGNOSIS — H524 Presbyopia: Secondary | ICD-10-CM | POA: Diagnosis not present

## 2021-12-27 DIAGNOSIS — Z961 Presence of intraocular lens: Secondary | ICD-10-CM | POA: Diagnosis not present

## 2021-12-27 DIAGNOSIS — H5213 Myopia, bilateral: Secondary | ICD-10-CM | POA: Diagnosis not present

## 2021-12-27 DIAGNOSIS — Z794 Long term (current) use of insulin: Secondary | ICD-10-CM | POA: Diagnosis not present

## 2021-12-27 DIAGNOSIS — E119 Type 2 diabetes mellitus without complications: Secondary | ICD-10-CM | POA: Diagnosis not present

## 2021-12-27 DIAGNOSIS — H52203 Unspecified astigmatism, bilateral: Secondary | ICD-10-CM | POA: Diagnosis not present

## 2022-01-03 ENCOUNTER — Encounter: Payer: Self-pay | Admitting: *Deleted

## 2022-01-03 ENCOUNTER — Ambulatory Visit: Payer: Self-pay | Admitting: *Deleted

## 2022-01-03 DIAGNOSIS — E782 Mixed hyperlipidemia: Secondary | ICD-10-CM | POA: Diagnosis not present

## 2022-01-03 DIAGNOSIS — E1143 Type 2 diabetes mellitus with diabetic autonomic (poly)neuropathy: Secondary | ICD-10-CM | POA: Diagnosis not present

## 2022-01-03 DIAGNOSIS — I10 Essential (primary) hypertension: Secondary | ICD-10-CM | POA: Diagnosis not present

## 2022-01-03 DIAGNOSIS — H1045 Other chronic allergic conjunctivitis: Secondary | ICD-10-CM | POA: Insufficient documentation

## 2022-01-03 DIAGNOSIS — K219 Gastro-esophageal reflux disease without esophagitis: Secondary | ICD-10-CM | POA: Diagnosis not present

## 2022-01-03 DIAGNOSIS — J301 Allergic rhinitis due to pollen: Secondary | ICD-10-CM | POA: Insufficient documentation

## 2022-01-03 DIAGNOSIS — Z91013 Allergy to seafood: Secondary | ICD-10-CM | POA: Insufficient documentation

## 2022-01-03 NOTE — Patient Outreach (Signed)
  Care Coordination   Initial Visit Note   01/03/2022 Name: [name] MRN: [id number] DOB: 20-Jun-1955  [name] is a 66 y.o. year old female who sees [name], Jennifer Areola, MD for primary care. I spoke with  [name] by phone today.  What matters to the patients health and wellness today?  Manage diabetes Fruit flies ingested caused GI concerns  Has met with upstream nurse, [name] 01/25/22    Goals Addressed   None     SDOH assessments and interventions completed:  Yes     Care Coordination Interventions Activated:  Yes  Care Coordination Interventions:  Yes, provided   Follow up plan: Follow up call scheduled for 01/15/22    Encounter Outcome:  Pt. Visit Completed   [name] L. Lavina Hamman, RN, BSN, [place] Coordinator Office number [phone]

## 2022-01-03 NOTE — Patient Instructions (Addendum)
Visit Information  Thank you for taking time to visit with me today. Please don't hesitate to contact me if I can be of assistance to you.   Protein bars for diabetes are RXBar Chocolate [employer], KIND protein breakfast bars, [employer]. GoRaw Protein bars, Epic bar, Dang Keto bars, goodfats bars bullet proof collagen protein bars, health warrior pumpkin seed bars  Following are the goals we discussed today:   Goals Addressed               This Visit's Progress     Patient Stated     manage hypoglycemia ([name]) (pt-stated)   Not on track     Care Coordination Interventions: Reviewed medications with patient and discussed importance of medication adherence Counseled on importance of regular laboratory monitoring as prescribed Discussed plans with patient for ongoing care management follow up and provided patient with direct contact information for care management team Review of patient status, including review of consultants reports, relevant laboratory and other test results, and medications completed Assessed social determinant of health barriers Encouraged high protein fruit at night to decrease possible hypoglycemia episodes Discussed high protein fruits avocados, apricots kiwifruit, blackberries, orange, bananas, cantaloupe, raspberries peaches  Sent online education on high protein bars and food via my chart        Our next appointment is by telephone on 01/15/22 at 1:30  Please call the care guide team at [phone] if you need to cancel or reschedule your appointment.   If you are experiencing a Mental Health or [place] or need someone to talk to, please call the Suicide and Crisis Lifeline: 988   Patient verbalizes understanding of instructions and care plan provided today and agrees to view in [place]. Active MyChart status and patient understanding of how to access instructions and care plan via MyChart confirmed with patient.      The patient has been provided with contact information for the care management team and has been advised to call with any health related questions or concerns.   [place] Lavina Hamman, RN, BSN, [place] Coordinator Office number [phone]

## 2022-01-11 DIAGNOSIS — E1149 Type 2 diabetes mellitus with other diabetic neurological complication: Secondary | ICD-10-CM | POA: Diagnosis not present

## 2022-01-11 DIAGNOSIS — E1165 Type 2 diabetes mellitus with hyperglycemia: Secondary | ICD-10-CM | POA: Diagnosis not present

## 2022-01-15 ENCOUNTER — Ambulatory Visit: Payer: Self-pay | Admitting: *Deleted

## 2022-01-15 NOTE — Patient Outreach (Addendum)
  Care Coordination   01/15/2022 Name: [name] MRN: [id number] DOB: May 11, 1955   Care Coordination Outreach Attempts:  An unsuccessful telephone outreach was attempted today to offer the patient information about available care coordination services as a benefit of their health plan.  Wished patient a happy birthday   Follow Up Plan:  Additional outreach attempts will be made to offer the patient care coordination information and services.   Encounter Outcome:  No Answer  Care Coordination Interventions Activated:  No   Care Coordination Interventions:  No, not indicated    SIG Jocelyne Reinertsen L. Lavina Hamman, RN, BSN, [place] Coordinator Office number [phone]

## 2022-01-18 DIAGNOSIS — E559 Vitamin D deficiency, unspecified: Secondary | ICD-10-CM | POA: Diagnosis not present

## 2022-01-18 DIAGNOSIS — I10 Essential (primary) hypertension: Secondary | ICD-10-CM | POA: Diagnosis not present

## 2022-01-18 DIAGNOSIS — R7301 Impaired fasting glucose: Secondary | ICD-10-CM | POA: Diagnosis not present

## 2022-01-18 DIAGNOSIS — R809 Proteinuria, unspecified: Secondary | ICD-10-CM | POA: Diagnosis not present

## 2022-01-23 DIAGNOSIS — E1165 Type 2 diabetes mellitus with hyperglycemia: Secondary | ICD-10-CM | POA: Diagnosis not present

## 2022-01-23 DIAGNOSIS — Z794 Long term (current) use of insulin: Secondary | ICD-10-CM | POA: Diagnosis not present

## 2022-01-28 DIAGNOSIS — K219 Gastro-esophageal reflux disease without esophagitis: Secondary | ICD-10-CM | POA: Diagnosis not present

## 2022-01-28 DIAGNOSIS — I739 Peripheral vascular disease, unspecified: Secondary | ICD-10-CM | POA: Diagnosis not present

## 2022-01-28 DIAGNOSIS — E870 Hyperosmolality and hypernatremia: Secondary | ICD-10-CM | POA: Diagnosis not present

## 2022-01-28 DIAGNOSIS — K581 Irritable bowel syndrome with constipation: Secondary | ICD-10-CM | POA: Diagnosis not present

## 2022-01-28 DIAGNOSIS — E1165 Type 2 diabetes mellitus with hyperglycemia: Secondary | ICD-10-CM | POA: Diagnosis not present

## 2022-01-28 DIAGNOSIS — E1143 Type 2 diabetes mellitus with diabetic autonomic (poly)neuropathy: Secondary | ICD-10-CM | POA: Diagnosis not present

## 2022-01-28 DIAGNOSIS — I10 Essential (primary) hypertension: Secondary | ICD-10-CM | POA: Diagnosis not present

## 2022-01-28 DIAGNOSIS — G43009 Migraine without aura, not intractable, without status migrainosus: Secondary | ICD-10-CM | POA: Diagnosis not present

## 2022-01-28 DIAGNOSIS — F17200 Nicotine dependence, unspecified, uncomplicated: Secondary | ICD-10-CM | POA: Diagnosis not present

## 2022-01-28 DIAGNOSIS — E782 Mixed hyperlipidemia: Secondary | ICD-10-CM | POA: Diagnosis not present

## 2022-01-28 DIAGNOSIS — E559 Vitamin D deficiency, unspecified: Secondary | ICD-10-CM | POA: Diagnosis not present

## 2022-01-29 ENCOUNTER — Ambulatory Visit: Payer: Self-pay | Admitting: *Deleted

## 2022-01-29 NOTE — Patient Outreach (Signed)
  Care Coordination   01/29/2022 Name: [name] MRN: [id number] DOB: 06/16/1955   Care Coordination Outreach Attempts:  A second unsuccessful outreach was attempted today to offer the patient with information about available care coordination services as a benefit of their health plan.     Follow Up Plan:  Additional outreach attempts will be made to offer the patient care coordination information and services.   Encounter Outcome:  No Answer  Care Coordination Interventions Activated:  No   Care Coordination Interventions:  No, not indicated    SIG Lynkin Saini L. Lavina Hamman, RN, BSN, [place] Coordinator Office number [phone]

## 2022-02-06 ENCOUNTER — Ambulatory Visit: Payer: Self-pay | Admitting: *Deleted

## 2022-02-06 NOTE — Patient Outreach (Signed)
  Care Coordination   Follow up   Visit Note   02/06/2022 Name: [name] MRN: [id number] DOB: 1955-10-09  [name] is a 66 y.o. year old female who sees Nevada Crane, [name], MD for primary care. I spoke with  [name] by phone today.  What matters to the patients health and wellness today?  Patient returned call to RN CM. Today she is having hip pain related to relocating tasks and voice interest in new [place] provider Velora Heckler)  Hip pain She has used heat to the hip with minimal relief. She reports she was suppose to have hip surgery but does not prefer to at this time. She would like to receive an upcoming steroid injection but wants to wait to have this after she completes all tasks with moving.  Diabetes - reports "up and down" and not manageable during this time of relocation.    Goals Addressed               This Visit's Progress     Patient Stated     Find new local providers [facility]) (pt-stated)   Not on track     Care Coordination Interventions: Evaluation of current treatment plan related to local medical providers (relocation) and patient's adherence to plan as established by provider Sent a list of in network providers to patient e-mail address to be viewed and outreach as needed      manage hypoglycemia ([name]) (pt-stated)        Care Coordination Interventions: Discussed plans with patient for ongoing care management follow up and provided patient with direct contact information for care management team Screening for signs and symptoms of depression related to chronic disease state  Assessed social determinant of health barriers Assessed for hypoglycemia. Confirmed varying values as she is relocating Encouragement provided        SDOH assessments and interventions completed:  Yes  SDOH Interventions Today    Flowsheet Row Most Recent Value  SDOH Interventions   Food Insecurity Interventions Intervention Not Indicated  Housing Interventions  Intervention Not Indicated  Utilities Interventions Intervention Not Indicated  Financial Strain Interventions Intervention Not Indicated        Care Coordination Interventions Activated:  Yes  Care Coordination Interventions:  Yes, provided   Follow up plan: Follow up call scheduled for 03/08/22 1030     Encounter Outcome:  Pt. Visit Completed   Jennifer Canby L. Lavina Hamman, RN, BSN, [place] Coordinator Office number [phone]

## 2022-02-06 NOTE — Patient Instructions (Signed)
Visit Information  Thank you for taking time to visit with me today. Please don't hesitate to contact me if I can be of assistance to you.   Following are the goals we discussed today:   Goals Addressed               This Visit's Progress     Patient Stated     Find new local providers [facility]) (pt-stated)   Not on track     Care Coordination Interventions: Evaluation of current treatment plan related to local medical providers (relocation) and patient's adherence to plan as established by provider Sent a list of in network providers to patient e-mail address to be viewed and outreach as needed      manage hypoglycemia ([name]) (pt-stated)        Care Coordination Interventions: Discussed plans with patient for ongoing care management follow up and provided patient with direct contact information for care management team Screening for signs and symptoms of depression related to chronic disease state  Assessed social determinant of health barriers Assessed for hypoglycemia. Confirmed varying values as she is relocating Encouragement provided        Our next appointment is by telephone on 03/08/22 at 1030  Please call the care guide team at [phone] if you need to cancel or reschedule your appointment.   If you are experiencing a Mental Health or [place] or need someone to talk to, please call the Suicide and Crisis Lifeline: 988 call the [place] National Suicide Prevention Lifeline: 628-131-9176 or TTY: 540-880-4193 TTY 6122401936) to talk to a trained counselor call 1-800-273-TALK (toll free, 24 hour hotline) go to [facility] [address], [place] [phone]) call 911   Patient verbalizes understanding of instructions and care plan provided today and agrees to view in [place]. Active MyChart status and patient understanding of how to access instructions and care plan via MyChart confirmed with patient.     The  patient has been provided with contact information for the care management team and has been advised to call with any health related questions or concerns.   [place] Lavina Hamman, RN, BSN, [place] Coordinator Office number [phone]

## 2022-02-06 NOTE — Patient Outreach (Signed)
  Care Coordination   02/06/2022 Name: [name] MRN: [id number] DOB: [birth year]   Care Coordination Outreach Attempts:  A second unsuccessful outreach was attempted today to offer the patient with information about available care coordination services as a benefit of their health plan.    Outreaches to home and mobile Left voice messages x 2  Follow Up Plan:  Additional outreach attempts will be made to offer the patient care coordination information and services.   Encounter Outcome:  No Answer  Care Coordination Interventions Activated:  No   Care Coordination Interventions:  No, not indicated    SIG Neleh Muldoon L. Lavina Hamman, RN, BSN, [place] Coordinator Office number [phone]

## 2022-02-10 DIAGNOSIS — E1149 Type 2 diabetes mellitus with other diabetic neurological complication: Secondary | ICD-10-CM | POA: Diagnosis not present

## 2022-02-10 DIAGNOSIS — E1165 Type 2 diabetes mellitus with hyperglycemia: Secondary | ICD-10-CM | POA: Diagnosis not present

## 2022-02-13 DIAGNOSIS — M25561 Pain in right knee: Secondary | ICD-10-CM | POA: Diagnosis not present

## 2022-02-13 DIAGNOSIS — M25562 Pain in left knee: Secondary | ICD-10-CM | POA: Diagnosis not present

## 2022-02-22 DIAGNOSIS — Z794 Long term (current) use of insulin: Secondary | ICD-10-CM | POA: Diagnosis not present

## 2022-02-22 DIAGNOSIS — E1165 Type 2 diabetes mellitus with hyperglycemia: Secondary | ICD-10-CM | POA: Diagnosis not present

## 2022-03-01 ENCOUNTER — Emergency Department (HOSPITAL_COMMUNITY): Payer: Medicare Other

## 2022-03-01 ENCOUNTER — Encounter: Payer: Self-pay | Admitting: Nurse Practitioner

## 2022-03-01 ENCOUNTER — Ambulatory Visit (INDEPENDENT_AMBULATORY_CARE_PROVIDER_SITE_OTHER): Payer: Medicare Other | Admitting: Nurse Practitioner

## 2022-03-01 ENCOUNTER — Other Ambulatory Visit: Payer: Self-pay

## 2022-03-01 ENCOUNTER — Encounter (HOSPITAL_COMMUNITY): Payer: Self-pay | Admitting: *Deleted

## 2022-03-01 ENCOUNTER — Emergency Department (HOSPITAL_COMMUNITY)
Admission: EM | Admit: 2022-03-01 | Discharge: 2022-03-01 | Disposition: A | Discharge status: Home / Self Care | Payer: Medicare Other | Attending: Emergency Medicine | Admitting: Emergency Medicine

## 2022-03-01 DIAGNOSIS — K219 Gastro-esophageal reflux disease without esophagitis: Secondary | ICD-10-CM

## 2022-03-01 DIAGNOSIS — Z794 Long term (current) use of insulin: Secondary | ICD-10-CM | POA: Diagnosis not present

## 2022-03-01 DIAGNOSIS — R29818 Other symptoms and signs involving the nervous system: Secondary | ICD-10-CM | POA: Diagnosis not present

## 2022-03-01 DIAGNOSIS — I6523 Occlusion and stenosis of bilateral carotid arteries: Secondary | ICD-10-CM | POA: Diagnosis not present

## 2022-03-01 DIAGNOSIS — R404 Transient alteration of awareness: Secondary | ICD-10-CM | POA: Diagnosis not present

## 2022-03-01 DIAGNOSIS — I6611 Occlusion and stenosis of right anterior cerebral artery: Secondary | ICD-10-CM | POA: Diagnosis not present

## 2022-03-01 DIAGNOSIS — R519 Headache, unspecified: Secondary | ICD-10-CM | POA: Diagnosis present

## 2022-03-01 DIAGNOSIS — Z743 Need for continuous supervision: Secondary | ICD-10-CM | POA: Diagnosis not present

## 2022-03-01 DIAGNOSIS — G43809 Other migraine, not intractable, without status migrainosus: Secondary | ICD-10-CM | POA: Diagnosis not present

## 2022-03-01 DIAGNOSIS — I6621 Occlusion and stenosis of right posterior cerebral artery: Secondary | ICD-10-CM | POA: Diagnosis not present

## 2022-03-01 DIAGNOSIS — I639 Cerebral infarction, unspecified: Secondary | ICD-10-CM | POA: Diagnosis not present

## 2022-03-01 DIAGNOSIS — Z7982 Long term (current) use of aspirin: Secondary | ICD-10-CM | POA: Diagnosis not present

## 2022-03-01 DIAGNOSIS — R42 Dizziness and giddiness: Secondary | ICD-10-CM | POA: Diagnosis not present

## 2022-03-01 DIAGNOSIS — H579 Unspecified disorder of eye and adnexa: Secondary | ICD-10-CM | POA: Diagnosis not present

## 2022-03-01 LAB — DIFFERENTIAL
Abs Immature Granulocytes: 0.03 10*3/uL (ref 0.00–0.07)
Basophils Absolute: 0.1 10*3/uL (ref 0.0–0.1)
Basophils Relative: 1 %
Eosinophils Absolute: 0.1 10*3/uL (ref 0.0–0.5)
Eosinophils Relative: 1 %
Immature Granulocytes: 0 %
Lymphocytes Relative: 32 %
Lymphs Abs: 2.5 10*3/uL (ref 0.7–4.0)
Monocytes Absolute: 0.6 10*3/uL (ref 0.1–1.0)
Monocytes Relative: 8 %
Neutro Abs: 4.4 10*3/uL (ref 1.7–7.7)
Neutrophils Relative %: 58 %

## 2022-03-01 LAB — COMPREHENSIVE METABOLIC PANEL
ALT: 13 U/L (ref 0–44)
AST: 24 U/L (ref 15–41)
Albumin: 4 g/dL (ref 3.5–5.0)
Alkaline Phosphatase: 51 U/L (ref 38–126)
Anion gap: 11 (ref 5–15)
BUN: 11 mg/dL (ref 8–23)
CO2: 23 mmol/L (ref 22–32)
Calcium: 9.8 mg/dL (ref 8.9–10.3)
Chloride: 106 mmol/L (ref 98–111)
Creatinine, Ser: 0.82 mg/dL (ref 0.44–1.00)
GFR, Estimated: >60 mL/min (ref >60)
Glucose, Bld: 85 mg/dL (ref 70–99)
Potassium: 4.2 mmol/L (ref 3.5–5.1)
Sodium: 140 mmol/L (ref 135–145)
Total Bilirubin: 1.2 mg/dL (ref 0.3–1.2)
Total Protein: 6.7 g/dL (ref 6.5–8.1)

## 2022-03-01 LAB — CBC
HCT: 39.8 % (ref 36.0–46.0)
Hemoglobin: 13.1 g/dL (ref 12.0–15.0)
MCH: 28.9 pg (ref 26.0–34.0)
MCHC: 32.9 g/dL (ref 30.0–36.0)
MCV: 87.7 fL (ref 80.0–100.0)
Platelets: 296 10*3/uL (ref 150–400)
RBC: 4.54 MIL/uL (ref 3.87–5.11)
RDW: 13.7 % (ref 11.5–15.5)
WBC: 7.7 10*3/uL (ref 4.0–10.5)
nRBC: 0 % (ref 0.0–0.2)

## 2022-03-01 LAB — I-STAT CHEM 8, ED
BUN: 11 mg/dL (ref 8–23)
Calcium, Ion: 1.23 mmol/L (ref 1.15–1.40)
Chloride: 105 mmol/L (ref 98–111)
Creatinine, Ser: 0.7 mg/dL (ref 0.44–1.00)
Glucose, Bld: 85 mg/dL (ref 70–99)
HCT: 40 % (ref 36.0–46.0)
Hemoglobin: 13.6 g/dL (ref 12.0–15.0)
Potassium: 4.1 mmol/L (ref 3.5–5.1)
Sodium: 143 mmol/L (ref 135–145)
TCO2: 26 mmol/L (ref 22–32)

## 2022-03-01 LAB — PROTIME-INR
INR: 1 (ref 0.8–1.2)
Prothrombin Time: 13 seconds (ref 11.4–15.2)

## 2022-03-01 LAB — CBG MONITORING, ED: Glucose-Capillary: 92 mg/dL (ref 70–99)

## 2022-03-01 LAB — APTT: aPTT: 34 seconds (ref 24–36)

## 2022-03-01 MED ORDER — METOCLOPRAMIDE HCL 5 MG/ML IJ SOLN
10.0000 mg | Freq: Once | INTRAMUSCULAR | Status: AC
  Administered 2022-03-01: 10 mg via INTRAVENOUS
  Filled 2022-03-01: qty 2

## 2022-03-01 MED ORDER — PROCHLORPERAZINE EDISYLATE 10 MG/2ML IJ SOLN: 10.0000 mg | Freq: Once | INTRAMUSCULAR | Status: DC

## 2022-03-01 MED ORDER — KETOROLAC TROMETHAMINE 30 MG/ML IJ SOLN
30.0000 mg | Freq: Once | INTRAMUSCULAR | Status: AC
  Administered 2022-03-01: 30 mg via INTRAVENOUS
  Filled 2022-03-01: qty 1

## 2022-03-01 MED ORDER — DIPHENHYDRAMINE HCL 50 MG/ML IJ SOLN
12.5000 mg | Freq: Once | INTRAMUSCULAR | Status: AC
  Administered 2022-03-01: 12.5 mg via INTRAVENOUS
  Filled 2022-03-01: qty 1

## 2022-03-01 MED ORDER — SODIUM CHLORIDE 0.9% FLUSH: 3.0000 mL | Freq: Once | INTRAVENOUS | Status: DC

## 2022-03-01 MED ORDER — PROCHLORPERAZINE EDISYLATE 10 MG/2ML IJ SOLN
10.0000 mg | Freq: Once | INTRAMUSCULAR | Status: AC
  Administered 2022-03-01: 10 mg via INTRAVENOUS
  Filled 2022-03-01: qty 2

## 2022-03-01 NOTE — Code Documentation (Signed)
Stroke Response Nurse Documentation Code Documentation  [name] is a 66 y.o. female arriving to [facility]  via [facility] on 03/01/2022 with past medical hx of HTN, HLP, Diabetes, Migraines, Stroke, Cancer. Code stroke was activated by EMS.   Patient from [place] office where she was be where she was LKW at 1000 when she was at work and started to have some dizziness and felt "off." Pt reports that she continued to feel this way and then at 1300 she was in her acr and started to have blurred vision where she had to close one eye to drive. Arrived at GI appt and they called EMS due to new focal deficits.   Stroke team at the bedside on patient arrival. Labs drawn and patient cleared for CT.  Patient to CT with team. NIHSS 2, see documentation for details and code stroke times. Patient with right facial droop and dysarthria  on exam. The following imaging was completed:  CT Head. Patient is not a candidate for IV Thrombolytic due to outside window. Patient is not a candidate for IR due to LVO not suspected.   Care Plan: q2 NIHSS/VS. .   Bedside handoff with ED RN Claiborne Billings.    Kathrin Greathouse  Stroke Response RN

## 2022-03-01 NOTE — ED Notes (Signed)
Pt resting comfortably in bed. States that her headache has improved. Pt is alert and oriented. NAD.   Call light is within reach

## 2022-03-01 NOTE — ED Notes (Signed)
Glucose 110

## 2022-03-01 NOTE — ED Notes (Signed)
To mri 

## 2022-03-01 NOTE — ED Provider Notes (Signed)
[facility] EMERGENCY DEPARTMENT Provider Note   CSN: [id number] Arrival date & time: 03/01/22  1433  An emergency department physician performed an initial assessment on this suspected stroke patient at 1433.  History  Chief Complaint  Patient presents with   Code Stroke    [name] is a 66 y.o. female presenting to ED with as a code stroke.  The patient reports that she has a prior history of strokes.  She woke up feeling well this morning, went into work.  Around 12 AM she noted that she was having blurred vision and tingling in her face.  She is not certain whether it was blurred vision or double vision.  She also began having a headache.  She does suffer from chronic migraines and the head hurts in a typical migraine pattern, but she has never had blurred vision or neurodeficits with her migraines.since arriving in the ED she feels that her vision has returned to normal and her face feels normal.  She still continues to have a headache however.  HPI     Home Medications Prior to Admission medications   Medication Sig Start Date End Date Taking? Authorizing Provider  acetaminophen (TYLENOL) 325 MG tablet Take 2 tablets (650 mg total) by mouth every 6 (six) hours as needed for mild pain (or Fever >/= 101). 07/02/19   Emokpae, Courage, MD  albuterol (PROAIR HFA) 108 (90 Base) MCG/ACT inhaler 1-2 inhalations every 4-6 hours as needed for cough or wheeze. Patient taking differently: Inhale 1-2 puffs into the lungs every 4 (four) hours as needed for shortness of breath. 07/10/15   Bobbitt, Sedalia Muta, MD  ALPRAZolam Duanne Moron) 0.5 MG tablet Take 0.5 mg by mouth 2 (two) times daily as needed for anxiety.    [provider]  aspirin EC 81 MG tablet Take 1 tablet by mouth daily.    [provider]  Azelastine HCl 0.15 % SOLN Place 2 sprays into both nostrils 2 (two) times daily. Patient taking differently: Place 2 sprays into both nostrils 2 (two)  times daily as needed (allergies). 07/10/15   Bobbitt, Sedalia Muta, MD  bisacodyl (DULCOLAX) 5 MG EC tablet Take 5 mg by mouth daily as needed for moderate constipation. 08/19/19   Gatha Mayer, MD  citalopram (CELEXA) 20 MG tablet Take 20 mg by mouth daily. 04/17/20   [provider]  clopidogrel (PLAVIX) 75 MG tablet Take 1 tablet (75 mg total) by mouth daily. 07/03/19   Roxan Hockey, MD  Continuous Blood Gluc Receiver ([place]) [place] by Does not apply route.    [provider]  cyclobenzaprine (FLEXERIL) 10 MG tablet Take 1 tablet by mouth as needed.    [provider]  D3-50 1.25 MG (50000 UT) capsule Take 50,000 Units by mouth every 7 (seven) days. 06/06/20   [provider]  doxycycline (VIBRAMYCIN) 100 MG capsule Take 1 capsule (100 mg total) by mouth 2 (two) times daily. Patient taking differently: Take 100 mg by mouth as needed. 02/20/21   Vanessa Kick, MD  empagliflozin (JARDIANCE) 25 MG TABS tablet Take 1 tablet (25 mg total) by mouth daily before breakfast. 06/08/21   [name], Melanie Crazier, MD  EPINEPHrine 0.3 mg/0.3 mL IJ SOAJ injection Inject 0.3 mLs (0.3 mg total) into the muscle once. Patient not taking: Reported on 03/01/2022 08/02/15   Bobbitt, Sedalia Muta, MD  fluconazole (DIFLUCAN) 150 MG tablet Take 1 tablet by mouth as needed. 01/28/22   [provider]  Fluocinolone Acetonide Body 0.01 % OIL Apply topically 2 (two) times daily. 10/29/19   [provider]  fluticasone (FLONASE) 50 MCG/ACT nasal spray SPRAY 2 SPRAYS INTO EACH NOSTRIL EVERY DAY 06/06/20   [provider]  Insulin Disposable Pump (OMNIPOD 5 G6 POD, GEN 5,) MISC 1 Device by Other route every 3 (three) days. Change every 72 hours 11/14/21   [name], Melanie Crazier, MD  insulin lispro (HUMALOG) 100 UNIT/ML injection MAX DAILY 100 UNITS PER OMNIPOD 08/16/21   [name], Melanie Crazier, MD  levocetirizine (XYZAL) 5 MG tablet Take 5 mg by  mouth daily. 04/17/20   [provider]  loperamide (IMODIUM) 2 MG capsule Take 2 mg by mouth daily. 07/18/21   [provider]  loratadine (CLARITIN) 10 MG tablet Take 1 tablet by mouth daily.    [provider]  losartan-hydrochlorothiazide (HYZAAR) 50-12.5 MG tablet Take 1 tablet by mouth daily. 08/18/19   Lendon Colonel, NP  lubiprostone (AMITIZA) 24 MCG capsule Take 1 capsule (24 mcg total) by mouth 2 (two) times daily with a meal. 02/06/21   Kennedy-Smith, Patrecia Pour, NP  Magnesium 200 MG TABS Take 1 tablet (200 mg total) by mouth daily. 08/18/19   Lendon Colonel, NP  meloxicam (MOBIC) 15 MG tablet Take 1 tablet (15 mg total) by mouth daily. Toxic effects may be increased with concurrent administration of NSAIDs and Selective Serotonin Reuptake Inhibitors. The risk of upper gastrointestinal bleeding may be increased. Patients taking both drugs concurrently should be educated about the signs and symptoms of GI bleeding. 12/15/20   Wurst, [place], PA-C  montelukast (SINGULAIR) 10 MG tablet Take 10 mg by mouth daily. 02/22/22   [provider]  mupirocin ointment (BACTROBAN) 2 % Apply 1 application topically 2 (two) times daily. Patient taking differently: Apply 1 application  topically 2 (two) times daily as needed. 06/02/19   Wurst, [place], PA-C  nitroGLYCERIN (NITROSTAT) 0.4 MG SL tablet PLACE 1 TAB UNDER TONGUE EVERY 5 MINS AS NEEDED FOR CHEST PAIN - MAX 3 DOSES THEN 911 Patient not taking: Reported on 03/01/2022 12/06/20   Lorretta Harp, MD  pantoprazole (PROTONIX) 40 MG tablet TAKE 1 TABLET BY MOUTH EVERY DAY Patient taking differently: Take 40 mg by mouth daily. 01/07/17   Lorretta Harp, MD  polyethylene glycol (MIRALAX / GLYCOLAX) 17 g packet Take 17 g by mouth daily as needed for moderate constipation or severe constipation. 08/19/19   Gatha Mayer, MD  potassium chloride (KLOR-CON) 10 MEQ tablet Take 10 mEq by mouth daily. 10/01/19    [provider]  propranolol ER (INDERAL LA) 60 MG 24 hr capsule Take 1 capsule (60 mg total) by mouth every evening. 08/18/19   Lendon Colonel, NP  rosuvastatin (CRESTOR) 40 MG tablet Take 1 tablet (40 mg total) by mouth every morning. 07/02/19   Roxan Hockey, MD  SUMAtriptan (IMITREX) 100 MG tablet Take 100 mg by mouth every 2 (two) hours as needed for migraine.     [provider]  temazepam (RESTORIL) 30 MG capsule Take 30 mg by mouth at bedtime as needed for sleep.    [provider]  traMADol (ULTRAM) 50 MG tablet Take 50 mg by mouth 3 (three) times daily as needed. 07/18/21   [provider]      Allergies    Sulfa antibiotics, Codeine, Fish allergy, Iodine, Metformin and related, [place] [insulin glargine-lixisenatide], Adhesive [tape], and Shellfish allergy    Review of Systems  Review of Systems  Physical Exam Updated Vital Signs BP 118/70   Pulse (!) 50   Temp (!) 97.3 F (36.3 C)   Resp 13   Wt 61.8 kg   SpO2 97%   BMI 23.39 kg/m  Physical Exam Constitutional:      General: She is not in acute distress. HENT:     Head: Normocephalic and atraumatic.  Eyes:     Conjunctiva/sclera: Conjunctivae normal.     Pupils: Pupils are equal, round, and reactive to light.  Cardiovascular:     Rate and Rhythm: Normal rate and regular rhythm.  Pulmonary:     Effort: Pulmonary effort is normal. No respiratory distress.  Abdominal:     General: There is no distension.     Tenderness: There is no abdominal tenderness.  Skin:    General: Skin is warm and dry.  Neurological:     General: No focal deficit present.     Mental Status: She is alert and oriented to person, place, and time. Mental status is at baseline.     Cranial Nerves: No cranial nerve deficit.     Sensory: No sensory deficit.     Motor: No weakness.  Psychiatric:        Mood and Affect: Mood normal.        Behavior: Behavior normal.     ED Results / Procedures /  Treatments   Labs (all labs ordered are listed, but only abnormal results are displayed) Labs Reviewed  PROTIME-INR  APTT  CBC  DIFFERENTIAL  COMPREHENSIVE METABOLIC PANEL  ETHANOL  I-STAT CHEM 8, ED  CBG MONITORING, ED    EKG None  Radiology MR BRAIN WO CONTRAST  Result Date: 03/01/2022 CLINICAL DATA:  Dizziness acute vision changes. EXAM: MRI HEAD WITHOUT CONTRAST TECHNIQUE: Multiplanar, multiecho pulse sequences of the brain and surrounding structures were obtained without intravenous contrast. COMPARISON:  CT head without contrast 03/01/2022 FINDINGS: Brain: No acute infarct, hemorrhage, or mass lesion is present. Remote ischemic changes are present within the thalami bilaterally. Deep gray nuclei are otherwise within normal limits. Focal T2 hyperintensity is present subcortical white matter of the left temporal lobe. Minimal white matter changes are otherwise within normal limits for age. The ventricles are of normal size. No significant extraaxial fluid collection is present. Vascular: Flow is present in the major intracranial arteries. Skull and upper cervical spine: The craniocervical junction is normal. Upper cervical spine is within normal limits. Marrow signal is unremarkable. Sinuses/Orbits: The paranasal sinuses and mastoid air cells are clear. Bilateral lens replacements are noted. Globes and orbits are otherwise unremarkable. IMPRESSION: 1. No acute intracranial abnormality. 2. Remote ischemic changes within the thalami bilaterally. 3. Focal T2 hyperintensity subcortical white matter of the left temporal lobe. This likely reflects the sequela of chronic microvascular ischemia. Electronically Signed   By: San Morelle M.D.   On: 03/01/2022 19:32   CT HEAD CODE STROKE WO CONTRAST  Result Date: 03/01/2022 CLINICAL DATA:  Code stroke.  Dizziness and acute vision changes. EXAM: CT HEAD WITHOUT CONTRAST TECHNIQUE: Contiguous axial images were obtained from the base of the  skull through the vertex without intravenous contrast. RADIATION DOSE REDUCTION: This exam was performed according to the departmental dose-optimization program which includes automated exposure control, adjustment of the mA and/or kV according to patient size and/or use of iterative reconstruction technique. COMPARISON:  CT head without contrast 06/20/2019 FINDINGS: Brain: No acute infarct, hemorrhage, or mass lesion is present. Mild atrophy and white matter changes  are similar the prior exam. Remote lacunar infarct is present in the inferior right lentiform nucleus. Basal ganglia are otherwise within normal limits. Insular ribbon is normal. Occipital cortex is within normal limits. The ventricles are of normal size. No significant extraaxial fluid collection is present. The brainstem and cerebellum are within normal limits. Vascular: Atherosclerotic calcifications are present within the cavernous internal carotid arteries bilaterally. No hyperdense vessel is present. Skull: Calvarium is intact. No focal lytic or blastic lesions are present. No significant extracranial soft tissue lesion is present. Sinuses/Orbits: Bilateral lens replacements are noted. Globes and orbits are otherwise unremarkable. Chronic wall thickening is present about the maxillary sinuses bilaterally. No active disease is present. The paranasal sinuses and mastoid air cells are otherwise clear. ASPECTS [facility] Stroke Program Early CT Score) - Ganglionic level infarction (caudate, lentiform nuclei, internal capsule, insula, M1-M3 cortex): 7/7 - Supraganglionic infarction (M4-M6 cortex): 3/3 Total score (0-10 with 10 being normal): 10/10 IMPRESSION: 1. No acute intracranial abnormality or significant interval change. 2. Stable mild atrophy and white matter disease. This likely reflects the sequela of chronic microvascular ischemia. 3. Remote lacunar infarct in the inferior right lentiform nucleus. 4. Aspects is 10/10. The above was relayed via  text pager to Dr. Leonel Ramsay on 03/01/2022 at 14:55. Electronically Signed   By: San Morelle M.D.   On: 03/01/2022 14:56    Procedures Procedures    Medications Ordered in ED Medications  sodium chloride flush (NS) 0.9 % injection 3 mL (has no administration in time range)  metoCLOPramide (REGLAN) injection 10 mg (10 mg Intravenous Given 03/01/22 1616)  diphenhydrAMINE (BENADRYL) injection 12.5 mg (12.5 mg Intravenous Given 03/01/22 1610)  ketorolac (TORADOL) 30 MG/ML injection 30 mg (30 mg Intravenous Given 03/01/22 1725)  prochlorperazine (COMPAZINE) injection 10 mg (10 mg Intravenous Given 03/01/22 1722)    ED Course/ Medical Decision Making/ A&P Clinical Course as of 03/01/22 2028  Fri Mar 01, 2022  1650 Patient reports that she feels back to baseline aside from the headache now.  She was given migraine medications already but feels that the headache is still moderate intensity.  We can consider a dose of Compazine and Toradol as well [MT]  1859 BUN: 11 [MT]  2026 Patient reports near complete resolution of her headache and feels that her blurred vision and paresthesias have gone away.  I suspect this is consistent with a complex migraine.  I agree with the neurologist assessment, I think she is reasonably stable and safe for discharge home.  The patient is content to go home and will call a ride. [MT]    Clinical Course User Index [MT] Froilan Mclean, Carola Rhine, MD                           Medical Decision Making Amount and/or Complexity of Data Reviewed Labs: ordered. Decision-making details documented in ED Course. Radiology: ordered.  Risk Prescription drug management.   This patient presents to the ED with concern for headache, facial paresthesia, blurred vision. This involves an extensive number of treatment options, and is a complaint that carries with it a high risk of complications and morbidity.  The differential diagnosis includes CVA versus metabolic derangement  versus complex migraine versus other  Co-morbidities that complicate the patient evaluation: History of prior stroke and stroke risk factors at risk for recurring stroke, including diabetes  Additional history obtained from EMS  I ordered and personally interpreted labs.  The pertinent results include: No  emergent findings  I ordered imaging studies including CT head, MRI and MRI of the brain I independently visualized and interpreted imaging which showed no acute stroke I agree with the radiologist interpretation  The patient was maintained on a cardiac monitor.  I personally viewed and interpreted the cardiac monitored which showed an underlying rhythm of: Normal sinus rhythm  I ordered medications for potential migraine, per discussion with the neurologist  I have reviewed the patients home medicines and have made adjustments as needed  Test Considered: Low suspicion for ruptured aneurysm or meningitis or multiple sclerosis/autoimmune disease. I do not see an indication for lumbar puncture at this time  I requested consultation with the neurology,  and discussed lab and imaging findings as well as pertinent plan - they recommend: MR imaging of the brain, if no acute stroke, consider treatment for complex migraine.  If symptoms are improved patient could be managed as an outpatient from a neurological perspective  After the interventions noted above, I reevaluated the patient and found that they have: improved   Dispostion:  After consideration of the diagnostic results and the patients response to treatment, I feel that the patent would benefit from outpatient follow-up.         Final Clinical Impression(s) / ED Diagnoses Final diagnoses:  Other migraine without status migrainosus, not intractable    Rx / DC Orders ED Discharge Orders     None         Wyvonnia Dusky, MD 03/01/22 2028

## 2022-03-01 NOTE — ED Notes (Signed)
Pt c/o a headache  med given iv

## 2022-03-01 NOTE — Patient Instructions (Signed)
Further GI recommendation to be determined after Emergency Room evaluation completed   Do not take any more Pepto bismol   Follow up with your vascular specialist as soon as possible

## 2022-03-01 NOTE — ED Notes (Signed)
Vitals documented at 1606 was not for this pt. Incorrect data validated for this pt.

## 2022-03-01 NOTE — Progress Notes (Signed)
Pt reports baseline twitching to her left face since last stroke.

## 2022-03-01 NOTE — Progress Notes (Signed)
03/01/2022 [name] [id number] 1955-09-06   Chief Complaint: Acid reflux, black stools   History of Present Illness: [name] is a 66 year old female with a past medical history of anxiety, asthma, hypertension, coronary artery disease s/p MI 2003, peripheral arterial disease s/p left fem-pop bypass with graft 06/2016, CVA x 3, DM type II, ovarian cancer, diverticulosis, IBS and GERD.   She presented to our office today 30 minutes prior to her appointment time. Upon her arrival, she notified our office staff that she was experiencing some dizziness and blurred vision. She was promptly escorted to the exam room. She developed dizziness as she was leaving work to drive to her appointment. She also felt a little off balance around 11:30am today. She developed blurred vision while driving and had to close one eye to drive to our office building. No chest pain, SOB or palpitations. Glucose level 103 per patient's glucose monitor device.  Hemodynamically stable.  She scheduled today's office appointment due to having worsening acid reflux symptoms and intermittent epigastric burning for the past 2 to 3 weeks.  She is taking Pantoprazole 40 mg once daily.  Stress level has been elevated since she recently moved from [place] to [place].  She has increased work stress as she is training a Engineer, technical sales at [employer].  She took Pepto-Bismol x 3 (finished a small bottle) 2 weeks ago. Last dose of Pepto was 1 week ago.  She noticed her stools were black after taking Pepto-Bismol which has persisted a few times this week.  She is prescribed Plavix 75 mg daily by her vascular specialist secondary to having peripheral arterial disease but she stopped taking it 1 month ago because she did not feel like taking it anymore.  She takes ASA 81 mg once daily.  She was admitted to [facility]/28/2019 with hyperglycemia, tachycardia and dizziness. Her admission hemoglobin was 15.3  and dropped to 10.7 with a reported history of melena x 1 week.  She underwent an EGD and colonoscopy by Dr. Laural Golden which were unrevealing.  Her clinical status stabilized and she was discharged home 07/02/2019 with instructions to continue PPI, restart aspirin and Plavix.  She also gives history of significant GI bleed back in April 2013 thought to be a diverticular bleed.  PAST GI PROCEDURES:  EGD 07/01/2019: - Normal esophagus. - Z-line irregular, 35 cm from the incisors. - 2 cm hiatal hernia with focal erythema to mucosa below GEJ. - A Nissen fundoplication was found. The wrap appears loose. - Normal duodenal bulb, second portion of the duodenum and area of the papilla. - No specimens collected.  Colonoscopy 07/02/2019: - One 5 mm polyp at the hepatic flexure, removed with a cold snare. Resected and retrieved. - Diverticulosis in the sigmoid colon, in the descending colon and in the transverse colon.  Colonoscopy 03/02/2020: - Three 2 to 4 mm polyps in the proximal transverse colon and at the hepatic flexure, removed with a cold snare. Resected and retrieved. - Mild pancolonic diverticulosis. - Non-bleeding internal hemorrhoids. - The examined portion of the ileum was normal. - The examination was otherwise normal on direct and retroflexion views. No masses. - 5 year colonoscopy recall   Past Medical History:  Diagnosis Date   Allergic urticaria 07/10/2015   Allergy    Anemia    hx   Angioedema 07/10/2015   Anxiety    Arthritis    "neck, left hand" (09/14/2012)   Asthma  Cancer ([place]) 1985   ovarian, no treatment except surgery   Cataract    Complication of anesthesia    [place] TO RIGHT   Critical lower limb ischemia ([place])    10/2014 s/p L SFA stenting   Diverticulosis of colon with hemorrhage April 2013   GERD (gastroesophageal reflux disease)    GI bleed    H/O hiatal hernia    Heart attack [facility])    2003 mild MI, March 2013 mild MI    Hidradenitis    groin   History of blood transfusion 1985 AND 2013   Hyperlipidemia    Hypertension    Irritable bowel syndrome    Left-sided weakness    since stroke, left eye trouble seeing   Migraines    Mild CAD    a. Cath 09/2010: mild luminal irregularities of LAD, 30% prox RCA and 20-30% mRCA, EF 65%.   Neuropathy    Obesity    Osteoporosis    PAD (peripheral artery disease) ([place])    a. critical limb ischemia s/p PTA/stenting of L SFA 10/2014. c. occ prior SFA stent by angio 01/2016, for possible PV bypass.   Pneumonia    baby   Recurrent upper respiratory infection (URI)    S/P arterial stent-mid Lt SFA 11/03/14 11/04/2014   Schatzki's ring    Sinus problem    Stomach ulcer 1972   non-bleeding   Stroke ([place]) 07-2007, 07-2008, 07-2009   total 3 strokes; mild left sided weakness and left eye "jumps".   Type II diabetes mellitus (HCC)    Vitamin B 12 deficiency 07-15-2013   Past Surgical History:  Procedure Laterality Date   ABDOMINAL HYSTERECTOMY  1985   ADENOIDECTOMY     ANTERIOR CERVICAL DECOMP/DISCECTOMY FUSION  2002   ANTERIOR CERVICAL DECOMP/DISCECTOMY FUSION N/A 04/08/2014   Procedure: Cervical Six-Seven ANTERIOR CERVICAL DECOMPRESSION/DISCECTOMY FUSION Plating and Bonegraft  2 LEVELS;  Surgeon: Ashok Pall, MD;  Location: [place] NEURO ORS;  Service: Neurosurgery;  Laterality: N/A;  Cervical Six-Seven ANTERIOR CERVICAL DECOMPRESSION/DISCECTOMY FUSION Plating and Bonegraft  2 LEVELS   APPENDECTOMY  1985   AXILLARY HIDRADENITIS EXCISION  1990-2008   bilateral   BACK SURGERY     BREAST BIOPSY Right 2007   BREAST CYST EXCISION Right 2008   BREAST REDUCTION SURGERY     CARDIAC CATHETERIZATION  2004   mild disease   CATARACT EXTRACTION W/PHACO Left 05/16/2015   Procedure: CATARACT EXTRACTION PHACO AND INTRAOCULAR LENS PLACEMENT (IOC);  Surgeon: Rutherford Guys, MD;  Location: [facility];  Service: Ophthalmology;  Laterality: Left;  CDE: 4.24   CATARACT EXTRACTION W/PHACO Right  05/30/2015   Procedure: CATARACT EXTRACTION RIGHT EYE PHACO AND INTRAOCULAR LENS PLACEMENT ;  Surgeon: Rutherford Guys, MD;  Location: [facility];  Service: Ophthalmology;  Laterality: Right;  CDE:4.08   CHOLECYSTECTOMY  1990's   COLONOSCOPY  08/12/2011   Procedure: COLONOSCOPY;  Surgeon: Ladene Artist, MD,FACG;  Location[facility] ENDOSCOPY;  Service: Endoscopy;  Laterality: N/A;   COLONOSCOPY  06/19/2006   COLONOSCOPY WITH PROPOFOL N/A 07/02/2019   Procedure: COLONOSCOPY WITH PROPOFOL;  Surgeon: Rogene Houston, MD;  [facility];  Service: Endoscopy;  Laterality: N/A;   cyst thigh Right    ESOPHAGOGASTRODUODENOSCOPY  08/12/2011   Procedure: ESOPHAGOGASTRODUODENOSCOPY (EGD);  Surgeon: Ladene Artist, MD,FACG;  Location[facility]OSCOPY;  Service: Endoscopy;  Laterality: N/A;   ESOPHAGOGASTRODUODENOSCOPY  06/04/2005   ESOPHAGOGASTRODUODENOSCOPY (EGD) WITH PROPOFOL N/A 07/01/2019   Procedure: ESOPHAGOGASTRODUODENOSCOPY (EGD) WITH PROPOFOL;  Surgeon: Rogene Houston, MD;  [facility];  Service: Endoscopy;  Laterality: N/A;   FEMORAL-POPLITEAL BYPASS GRAFT Left 06/19/2016   Procedure: Left Leg BYPASS GRAFT FEMORAL-POPLITEAL ARTERY;  Surgeon: Serafina Mitchell, MD;  Location: [place];  Service: Vascular;  Laterality: Left;   GIVENS CAPSULE STUDY  08/13/2011   Procedure: GIVENS CAPSULE STUDY;  Surgeon: Ladene Artist, MD,FACG;  Location: [facility] ENDOSCOPY;  Service: Endoscopy;  Laterality: N/A;   HAMMER TOE SURGERY Bilateral ~ 2000   HEMOSTASIS CLIP PLACEMENT  07/02/2019   Procedure: HEMOSTASIS CLIP PLACEMENT;  Surgeon: Rogene Houston, MD;  [facility];  Service: Endoscopy;;  hepatic flexure   HIATAL HERNIA REPAIR     HYDRADENITIS EXCISION  01/2011; 03/2012   'groin and abdomen; 03/2012" (09/14/2012)   HYDRADENITIS EXCISION  04/01/2012   Procedure: EXCISION HYDRADENITIS GROIN;  Surgeon: Pedro Earls, MD;  Location: [facility] ORS;  Service: General;  Laterality: Bilateral;  Excision of  Hydradenitis of Perineum   HYDRADENITIS EXCISION N/A 09/17/2013   Procedure: EXCISION PERINEAL HIDRADENITIS ;  Surgeon: Pedro Earls, MD;  Location: [facility];  Service: General;  Laterality: N/A;  also in the pubis area   LEFT HEART CATH AND CORONARY ANGIOGRAPHY N/A 12/26/2016   Procedure: LEFT HEART CATH AND CORONARY ANGIOGRAPHY;  Surgeon: [name], Peter M, MD;  Location: [place] CV LAB;  Service: Cardiovascular;  Laterality: N/A;   MASS EXCISION Right 09/17/2013   Procedure: EXCISION MASS;  Surgeon: Pedro Earls, MD;  Location: [facility] OR[facility];  Service: General;  Laterality: Right;   NISSEN FUNDOPLICATION  4008   PERIPHERAL VASCULAR CATHETERIZATION N/A 11/03/2014   Procedure: Lower Extremity Angiography;  Surgeon: Lorretta Harp, MD;  Location: [place] CV LAB;  Service: Cardiovascular;  Laterality: N/A;   PERIPHERAL VASCULAR CATHETERIZATION N/A 01/29/2016   Procedure: Lower Extremity Angiography;  Surgeon: Lorretta Harp, MD;  Location: [place] CV LAB;  Service: Cardiovascular;  Laterality: N/A;   POLYPECTOMY  07/02/2019   Procedure: POLYPECTOMY;  Surgeon: Rogene Houston, MD;  [facility];  Service: Endoscopy;;   POSTERIOR LUMBAR FUSION  2008 X 2   REDUCTION MAMMAPLASTY  1996?   TONSILLECTOMY AND ADENOIDECTOMY  1959 AND 2000   UVULOPALATOPHARYNGOPLASTY, TONSILLECTOMY AND SEPTOPLASTY  2000's    No current facility-administered medications on file prior to visit.   Current Outpatient Medications on File Prior to Visit  Medication Sig Dispense Refill   acetaminophen (TYLENOL) 325 MG tablet Take 2 tablets (650 mg total) by mouth every 6 (six) hours as needed for mild pain (or Fever >/= 101). 12 tablet 2   albuterol (PROAIR HFA) 108 (90 Base) MCG/ACT inhaler 1-2 inhalations every 4-6 hours as needed for cough or wheeze. (Patient taking differently: Inhale 1-2 puffs into the lungs every 4 (four) hours as needed for shortness of breath.) 1 Inhaler 1   ALPRAZolam (XANAX) 0.5  MG tablet Take 0.5 mg by mouth 2 (two) times daily as needed for anxiety.     aspirin EC 81 MG tablet Take 1 tablet by mouth daily.     Azelastine HCl 0.15 % SOLN Place 2 sprays into both nostrils 2 (two) times daily. (Patient taking differently: Place 2 sprays into both nostrils 2 (two) times daily as needed (allergies).) 30 mL 5   bisacodyl (DULCOLAX) 5 MG EC tablet Take 5 mg by mouth daily as needed for moderate constipation.     citalopram (CELEXA) 20 MG tablet Take 20 mg by  mouth daily.     clopidogrel (PLAVIX) 75 MG tablet Take 1 tablet (75 mg total) by mouth daily. 30 tablet 2   Continuous Blood Gluc Receiver ([place]) DEVI by Does not apply route.     cyclobenzaprine (FLEXERIL) 10 MG tablet Take 1 tablet by mouth as needed.     D3-50 1.25 MG (50000 UT) capsule Take 50,000 Units by mouth every 7 (seven) days.     doxycycline (VIBRAMYCIN) 100 MG capsule Take 1 capsule (100 mg total) by mouth 2 (two) times daily. (Patient taking differently: Take 100 mg by mouth as needed.) 14 capsule 0   empagliflozin (JARDIANCE) 25 MG TABS tablet Take 1 tablet (25 mg total) by mouth daily before breakfast. 90 tablet 3   fluconazole (DIFLUCAN) 150 MG tablet Take 1 tablet by mouth as needed.     Fluocinolone Acetonide Body 0.01 % OIL Apply topically 2 (two) times daily.     fluticasone (FLONASE) 50 MCG/ACT nasal spray SPRAY 2 SPRAYS INTO EACH NOSTRIL EVERY DAY     Insulin Disposable Pump (OMNIPOD 5 G6 POD, GEN 5,) MISC 1 Device by Other route every 3 (three) days. Change every 72 hours 6 each 3   insulin lispro (HUMALOG) 100 UNIT/ML injection MAX DAILY 100 UNITS PER OMNIPOD 100 mL 3   levocetirizine (XYZAL) 5 MG tablet Take 5 mg by mouth daily.     loperamide (IMODIUM) 2 MG capsule Take 2 mg by mouth daily.     loratadine (CLARITIN) 10 MG tablet Take 1 tablet by mouth daily.     losartan-hydrochlorothiazide (HYZAAR) 50-12.5 MG tablet Take 1 tablet by mouth daily. 90 tablet 3   lubiprostone  (AMITIZA) 24 MCG capsule Take 1 capsule (24 mcg total) by mouth 2 (two) times daily with a meal. 60 capsule 1   Magnesium 200 MG TABS Take 1 tablet (200 mg total) by mouth daily. 30 tablet 6   meloxicam (MOBIC) 15 MG tablet Take 1 tablet (15 mg total) by mouth daily. Toxic effects may be increased with concurrent administration of NSAIDs and Selective Serotonin Reuptake Inhibitors. The risk of upper gastrointestinal bleeding may be increased. Patients taking both drugs concurrently should be educated about the signs and symptoms of GI bleeding. 20 tablet 0   montelukast (SINGULAIR) 10 MG tablet Take 10 mg by mouth daily.     mupirocin ointment (BACTROBAN) 2 % Apply 1 application topically 2 (two) times daily. (Patient taking differently: Apply 1 application  topically 2 (two) times daily as needed.) 30 g 1   pantoprazole (PROTONIX) 40 MG tablet TAKE 1 TABLET BY MOUTH EVERY DAY (Patient taking differently: Take 40 mg by mouth daily.) 30 tablet 11   polyethylene glycol (MIRALAX / GLYCOLAX) 17 g packet Take 17 g by mouth daily as needed for moderate constipation or severe constipation.     potassium chloride (KLOR-CON) 10 MEQ tablet Take 10 mEq by mouth daily.     propranolol ER (INDERAL LA) 60 MG 24 hr capsule Take 1 capsule (60 mg total) by mouth every evening. 90 capsule 3   rosuvastatin (CRESTOR) 40 MG tablet Take 1 tablet (40 mg total) by mouth every morning. 90 tablet 3   SUMAtriptan (IMITREX) 100 MG tablet Take 100 mg by mouth every 2 (two) hours as needed for migraine.      temazepam (RESTORIL) 30 MG capsule Take 30 mg by mouth at bedtime as needed for sleep.     traMADol (ULTRAM) 50 MG tablet Take 50 mg  by mouth 3 (three) times daily as needed.     EPINEPHrine 0.3 mg/0.3 mL IJ SOAJ injection Inject 0.3 mLs (0.3 mg total) into the muscle once. (Patient not taking: Reported on 03/01/2022) 1 Device 1   nitroGLYCERIN (NITROSTAT) 0.4 MG SL tablet PLACE 1 TAB UNDER TONGUE EVERY 5 MINS AS NEEDED FOR  CHEST PAIN - MAX 3 DOSES THEN 911 (Patient not taking: Reported on 03/01/2022) 25 tablet 2   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   Fish Allergy Hives and Swelling    Tongue swelling   Iodine Swelling   Metformin And Related Diarrhea   [name] [Insulin Glargine-Lixisenatide] Itching and Other (See Comments)    "tongue swelling"   Adhesive [Tape] Rash    Paper tape is ok   Shellfish Allergy Swelling and Rash    Tongue swelling    Current Medications, Allergies, Past Medical History, Past Surgical History, Family History and Social History were reviewed in [employer] record.  Review of Systems:   Constitutional: Negative for fever, sweats, chills or weight loss.  Respiratory: Negative for shortness of breath.   Cardiovascular: Negative for chest pain, palpitations and leg swelling.  Gastrointestinal: See HPI.  Musculoskeletal: Negative for back pain or muscle aches.  Neurological: + Dizziness and blurred vision.  Vital signs: Sitting BP 126/64. HR 84.  Standing BP 108/86.  Heart rate 92.  Supine BP 130/64.  Heart rate 68. General: Alert 66 year old female. Head: Normocephalic and atraumatic. Eyes: No scleral icterus. Conjunctiva pink . Ears: Normal auditory acuity. Mouth: Dentition intact. No ulcers or lesions.  Lungs: Clear throughout to auscultation. Heart: Regular rate and rhythm, no murmur. Abdomen: Soft, nontender and nondistended. No masses or hepatomegaly. Normal bowel sounds x 4 quadrants.  Rectal: Dark brown stool, guaiac negative. Didi CMA present during exam.  Musculoskeletal: Symmetrical with no gross deformities. Extremities: No edema. Neurological: Alert oriented x 4. No focal deficits.  Psychological: Alert and cooperative. Normal mood and affect  Assessment and Recommendations:  75) 66 year old female  with a history of CVA x  3 with abrupt onset dizziness and blurred vision which occurred while driving today.  -Patient was [facility] to [facility] via EMS for further neurological evaluation  2) Active reflux symptoms with epigastric burning x 2 to 3 weeks.  -Further GI follow-up to be determined after ED evaluation completed  3) Melena vs black stools secondary to Pepto bismol use. History of GI bleed/melena identified during her hospital admission 06/2019. At that time, an EGD and colonoscopy are unrevealing.  Patient stopped taking Plavix 1 month ago. On ASA '81mg'$  QD.  Rectal exam today showed dark brown stool which was guaiac negative. -Patient sent to [facility] ED via EMS as noted above.  Await ED evaluation including labs.  If she is anemic and demonstrates any active GI bleeding then further GI evaluation as an inpatient would be required. -Patient instructed not to take any further Pepto-Bismol  4) History of tubular adenomatous colon polyp.  Three (2 - 4 mm) tubular adenomatous polyps removed from the colon per colonoscopy 02/2020. -Next colonoscopy due 02/2025  5) PAD s/p left fem-pop bypass with graft 06/2016, prescribed Plavix 75 mg daily, however, she elected to stop taking it 1 month ago.  Remains on ASA 81 mg daily. -She will need to follow-up with her vascular specialist  as she is at risk for in-stent stenosis off Plavix

## 2022-03-01 NOTE — ED Triage Notes (Signed)
Patient presents to ed via [facility] states she was getting ready for work this am and felt a little off balance approx. 1030 am, went to work was feeling ok  spoke to a co-worker around 1300 , walked to her car and 1305 had a visual change in her right eye. C/o numbness and tingling sensation inside her mouth. Upon arrival to ED alert oriented. Moves all ext x 4

## 2022-03-01 NOTE — Consult Note (Signed)
Neurology Consultation  Reason for Consult: Code Stroke Referring Physician: Dr. Langston Masker  CC: Dizziness, blurry vision, lower lip numbness, generalized headache  History is obtained from: Patient, Chart review  HPI: [name] is a 66 y.o. female with a medical history significant for multiple CVAs (2009, 2010, 2011) without residual deficit, type 2 diabetes mellitus, HTN, HLD, MI, CAD, tobacco use, migraine headaches, vitamin B12 deficiency, PAD with occluded left SFA s/p left femoropopliteal bypass graft in 2018 who presets to the ED via EMS for evaluation of acute onset of blurry vision and dizziness. Patient states that at around 09:00 this morning while at work, she noticed that her bottom lip felt numb. At around 11:00, the patient states that she lost her balance and noticed that she was dizzy. She got off of work at around 13:00 and was trying to drive to her GI doctor but noticed that her vision was blurry requiring her to close one eye to see while driving. EMS noted some concern for a left mouth droop and patient intermittently appears to have left mouth asymmetry on arrival. She states that her left mouth intermittently "locks up" since her previous stroke. She also endorses headache today with frequent headaches. Her headache is generalized and rated a 5/10 in severity. Patient states that she stopped taking clopidogrel one month ago to try and cut back on her medication list but states she still takes ASA 81 mg PO and is compliant with this medication.   LKW: 09:00 this morning 10/27 TNK given?: no, patient presented outside of the thrombolytic therapy time window IR Thrombectomy? No, presentation is not consistent with an LVO. Modified Rankin Scale: 0-Completely asymptomatic and back to baseline post- stroke  ROS: A complete ROS was performed and is negative except as noted in the HPI.   Past Medical History:  Diagnosis Date   Allergic urticaria 07/10/2015   Allergy    Anemia     hx   Angioedema 07/10/2015   Anxiety    Arthritis    "neck, left hand" (09/14/2012)   Asthma    Cancer ([place]) 1985   ovarian, no treatment except surgery   Cataract    Complication of anesthesia    [place] TO RIGHT   Critical lower limb ischemia ([place])    10/2014 s/p L SFA stenting   Diverticulosis of colon with hemorrhage April 2013   GERD (gastroesophageal reflux disease)    GI bleed    H/O hiatal hernia    Heart attack ([place])    2003 mild MI, March 2013 mild MI   Hidradenitis    groin   History of blood transfusion 1985 AND 2013   Hyperlipidemia    Hypertension    Irritable bowel syndrome    Left-sided weakness    since stroke, left eye trouble seeing   Migraines    Mild CAD    a. Cath 09/2010: mild luminal irregularities of LAD, 30% prox RCA and 20-30% mRCA, EF 65%.   Neuropathy    Obesity    Osteoporosis    PAD (peripheral artery disease) ([place])    a. critical limb ischemia s/p PTA/stenting of L SFA 10/2014. c. occ prior SFA stent by angio 01/2016, for possible PV bypass.   Pneumonia    baby   Recurrent upper respiratory infection (URI)    S/P arterial stent-mid Lt SFA 11/03/14 11/04/2014   Schatzki's ring    Sinus problem    Stomach ulcer 1972   non-bleeding  Stroke ([place]) 07-2007, 07-2008, 07-2009   total 3 strokes; mild left sided weakness and left eye "jumps".   Type II diabetes mellitus (HCC)    Vitamin B 12 deficiency 07-15-2013   Past Surgical History:  Procedure Laterality Date   ABDOMINAL HYSTERECTOMY  1985   ADENOIDECTOMY     ANTERIOR CERVICAL DECOMP/DISCECTOMY FUSION  2002   ANTERIOR CERVICAL DECOMP/DISCECTOMY FUSION N/A 04/08/2014   Procedure: Cervical Six-Seven ANTERIOR CERVICAL DECOMPRESSION/DISCECTOMY FUSION Plating and Bonegraft  2 LEVELS;  Surgeon: Ashok Pall, MD;  Location: [facility];  Service: Neurosurgery;  Laterality: N/A;  Cervical Six-Seven ANTERIOR CERVICAL DECOMPRESSION/DISCECTOMY FUSION Plating and Bonegraft  2 LEVELS    APPENDECTOMY  1985   AXILLARY HIDRADENITIS EXCISION  1990-2008   bilateral   BACK SURGERY     BREAST BIOPSY Right 2007   BREAST CYST EXCISION Right 2008   BREAST REDUCTION SURGERY     CARDIAC CATHETERIZATION  2004   mild disease   CATARACT EXTRACTION W/PHACO Left 05/16/2015   Procedure: CATARACT EXTRACTION PHACO AND INTRAOCULAR LENS PLACEMENT (IOC);  Surgeon: Rutherford Guys, MD;  Location: [facility];  Service: Ophthalmology;  Laterality: Left;  CDE: 4.24   CATARACT EXTRACTION W/PHACO Right 05/30/2015   Procedure: CATARACT EXTRACTION RIGHT EYE PHACO AND INTRAOCULAR LENS PLACEMENT ;  Surgeon: Rutherford Guys, MD;  Location: [facility];  Service: Ophthalmology;  Laterality: Right;  CDE:4.08   CHOLECYSTECTOMY  1990's   COLONOSCOPY  08/12/2011   Procedure: COLONOSCOPY;  Surgeon: Ladene Artist, MD,FACG;  Location: [facility] ENDOSCOPY;  Service: Endoscopy;  Laterality: N/A;   COLONOSCOPY  06/19/2006   COLONOSCOPY WITH PROPOFOL N/A 07/02/2019   Procedure: COLONOSCOPY WITH PROPOFOL;  Surgeon: Rogene Houston, MD;  [facility];  Service: Endoscopy;  Laterality: N/A;   cyst thigh Right    ESOPHAGOGASTRODUODENOSCOPY  08/12/2011   Procedure: ESOPHAGOGASTRODUODENOSCOPY (EGD);  Surgeon: Ladene Artist, MD,FACG;  Location: [facility] ENDOSCOPY;  Service: Endoscopy;  Laterality: N/A;   ESOPHAGOGASTRODUODENOSCOPY  06/04/2005   ESOPHAGOGASTRODUODENOSCOPY (EGD) WITH PROPOFOL N/A 07/01/2019   Procedure: ESOPHAGOGASTRODUODENOSCOPY (EGD) WITH PROPOFOL;  Surgeon: Rogene Houston, MD;  [facility];  Service: Endoscopy;  Laterality: N/A;   FEMORAL-POPLITEAL BYPASS GRAFT Left 06/19/2016   Procedure: Left Leg BYPASS GRAFT FEMORAL-POPLITEAL ARTERY;  Surgeon: Serafina Mitchell, MD;  Location: [facility];  Service: Vascular;  Laterality: Left;   GIVENS CAPSULE STUDY  08/13/2011   Procedure: GIVENS CAPSULE STUDY;  Surgeon: Ladene Artist, MD,FACG;  Location: [facility] ENDOSCOPY;  Service: Endoscopy;  Laterality: N/A;   HAMMER TOE SURGERY  Bilateral ~ 2000   HEMOSTASIS CLIP PLACEMENT  07/02/2019   Procedure: HEMOSTASIS CLIP PLACEMENT;  Surgeon: Rogene Houston, MD;  [facility];  Service: Endoscopy;;  hepatic flexure   HIATAL HERNIA REPAIR     HYDRADENITIS EXCISION  01/2011; 03/2012   'groin and abdomen; 03/2012" (09/14/2012)   HYDRADENITIS EXCISION  04/01/2012   Procedure: EXCISION HYDRADENITIS GROIN;  Surgeon: Pedro Earls, MD;  Location: [facility];  Service: General;  Laterality: Bilateral;  Excision of Hydradenitis of Perineum   HYDRADENITIS EXCISION N/A 09/17/2013   Procedure: EXCISION PERINEAL HIDRADENITIS ;  Surgeon: Pedro Earls, MD;  Location: [facility];  Service: General;  Laterality: N/A;  also in the pubis area   LEFT HEART CATH AND CORONARY ANGIOGRAPHY N/A 12/26/2016   Procedure: LEFT HEART CATH AND CORONARY ANGIOGRAPHY;  Surgeon: [name], Peter M, MD;  Location: [place] CV LAB;  Service: Cardiovascular;  Laterality: N/A;  MASS EXCISION Right 09/17/2013   Procedure: EXCISION MASS;  Surgeon: Pedro Earls, MD;  Location: [facility];  Service: General;  Laterality: Right;   NISSEN FUNDOPLICATION  6222   PERIPHERAL VASCULAR CATHETERIZATION N/A 11/03/2014   Procedure: Lower Extremity Angiography;  Surgeon: Lorretta Harp, MD;  Location: [place] CV LAB;  Service: Cardiovascular;  Laterality: N/A;   PERIPHERAL VASCULAR CATHETERIZATION N/A 01/29/2016   Procedure: Lower Extremity Angiography;  Surgeon: Lorretta Harp, MD;  Location: [place] CV LAB;  Service: Cardiovascular;  Laterality: N/A;   POLYPECTOMY  07/02/2019   Procedure: POLYPECTOMY;  Surgeon: Rogene Houston, MD;  [facility];  Service: Endoscopy;;   POSTERIOR LUMBAR FUSION  2008 X 2   REDUCTION MAMMAPLASTY  1996?   TONSILLECTOMY AND ADENOIDECTOMY  1959 AND 2000   UVULOPALATOPHARYNGOPLASTY, TONSILLECTOMY AND SEPTOPLASTY  2000's   Family History  Problem Relation Age of Onset   Breast cancer Mother    Heart disease Mother     Hypertension Mother    Diabetes Father    Heart disease Father    Stroke Father    Heart disease Sister    Hypertension Sister    Hypertension Sister    Hyperlipidemia Sister    Diabetes Sister    Allergic rhinitis Sister    Emphysema Other        great uncle   Aneurysm Sister        brain   Colon cancer Paternal Uncle    Angioedema Neg Hx    Asthma Neg Hx    Eczema Neg Hx    Immunodeficiency Neg Hx    Urticaria Neg Hx    Social History:   reports that she has been smoking cigarettes. She has a 20.50 pack-year smoking history. She has never used smokeless tobacco. She reports that she does not drink alcohol and does not use drugs.  Medications  Current Facility-Administered Medications:    sodium chloride flush (NS) 0.9 % injection 3 mL, 3 mL, Intravenous, Once, Nanavati, Ankit, MD  Current Outpatient Medications:    acetaminophen (TYLENOL) 325 MG tablet, Take 2 tablets (650 mg total) by mouth every 6 (six) hours as needed for mild pain (or Fever >/= 101)., Disp: 12 tablet, Rfl: 2   albuterol (PROAIR HFA) 108 (90 Base) MCG/ACT inhaler, 1-2 inhalations every 4-6 hours as needed for cough or wheeze. (Patient taking differently: Inhale 1-2 puffs into the lungs every 4 (four) hours as needed for shortness of breath.), Disp: 1 Inhaler, Rfl: 1   ALPRAZolam (XANAX) 0.5 MG tablet, Take 0.5 mg by mouth 2 (two) times daily as needed for anxiety., Disp: , Rfl:    aspirin EC 81 MG tablet, Take 1 tablet by mouth daily., Disp: , Rfl:    Azelastine HCl 0.15 % SOLN, Place 2 sprays into both nostrils 2 (two) times daily. (Patient taking differently: Place 2 sprays into both nostrils 2 (two) times daily as needed (allergies).), Disp: 30 mL, Rfl: 5   bisacodyl (DULCOLAX) 5 MG EC tablet, Take 5 mg by mouth daily as needed for moderate constipation., Disp: , Rfl:    citalopram (CELEXA) 20 MG tablet, Take 20 mg by mouth daily., Disp: , Rfl:    clopidogrel (PLAVIX) 75 MG tablet, Take 1 tablet (75 mg  total) by mouth daily., Disp: 30 tablet, Rfl: 2   Continuous Blood Gluc Receiver ([place]) DEVI, by Does not apply route., Disp: , Rfl:    cyclobenzaprine (FLEXERIL) 10 MG tablet,  Take 1 tablet by mouth as needed., Disp: , Rfl:    D3-50 1.25 MG (50000 UT) capsule, Take 50,000 Units by mouth every 7 (seven) days., Disp: , Rfl:    doxycycline (VIBRAMYCIN) 100 MG capsule, Take 1 capsule (100 mg total) by mouth 2 (two) times daily. (Patient taking differently: Take 100 mg by mouth as needed.), Disp: 14 capsule, Rfl: 0   empagliflozin (JARDIANCE) 25 MG TABS tablet, Take 1 tablet (25 mg total) by mouth daily before breakfast., Disp: 90 tablet, Rfl: 3   EPINEPHrine 0.3 mg/0.3 mL IJ SOAJ injection, Inject 0.3 mLs (0.3 mg total) into the muscle once. (Patient not taking: Reported on 03/01/2022), Disp: 1 Device, Rfl: 1   fluconazole (DIFLUCAN) 150 MG tablet, Take 1 tablet by mouth as needed., Disp: , Rfl:    Fluocinolone Acetonide Body 0.01 % OIL, Apply topically 2 (two) times daily., Disp: , Rfl:    fluticasone (FLONASE) 50 MCG/ACT nasal spray, SPRAY 2 SPRAYS INTO EACH NOSTRIL EVERY DAY, Disp: , Rfl:    Insulin Disposable Pump (OMNIPOD 5 G6 POD, GEN 5,) MISC, 1 Device by Other route every 3 (three) days. Change every 72 hours, Disp: 6 each, Rfl: 3   insulin lispro (HUMALOG) 100 UNIT/ML injection, MAX DAILY 100 UNITS PER OMNIPOD, Disp: 100 mL, Rfl: 3   levocetirizine (XYZAL) 5 MG tablet, Take 5 mg by mouth daily., Disp: , Rfl:    loperamide (IMODIUM) 2 MG capsule, Take 2 mg by mouth daily., Disp: , Rfl:    loratadine (CLARITIN) 10 MG tablet, Take 1 tablet by mouth daily., Disp: , Rfl:    losartan-hydrochlorothiazide (HYZAAR) 50-12.5 MG tablet, Take 1 tablet by mouth daily., Disp: 90 tablet, Rfl: 3   lubiprostone (AMITIZA) 24 MCG capsule, Take 1 capsule (24 mcg total) by mouth 2 (two) times daily with a meal., Disp: 60 capsule, Rfl: 1   Magnesium 200 MG TABS, Take 1 tablet (200 mg total) by mouth  daily., Disp: 30 tablet, Rfl: 6   meloxicam (MOBIC) 15 MG tablet, Take 1 tablet (15 mg total) by mouth daily. Toxic effects may be increased with concurrent administration of NSAIDs and Selective Serotonin Reuptake Inhibitors. The risk of upper gastrointestinal bleeding may be increased. Patients taking both drugs concurrently should be educated about the signs and symptoms of GI bleeding., Disp: 20 tablet, Rfl: 0   montelukast (SINGULAIR) 10 MG tablet, Take 10 mg by mouth daily., Disp: , Rfl:    mupirocin ointment (BACTROBAN) 2 %, Apply 1 application topically 2 (two) times daily. (Patient taking differently: Apply 1 application  topically 2 (two) times daily as needed.), Disp: 30 g, Rfl: 1   nitroGLYCERIN (NITROSTAT) 0.4 MG SL tablet, PLACE 1 TAB UNDER TONGUE EVERY 5 MINS AS NEEDED FOR CHEST PAIN - MAX 3 DOSES THEN 911 (Patient not taking: Reported on 03/01/2022), Disp: 25 tablet, Rfl: 2   pantoprazole (PROTONIX) 40 MG tablet, TAKE 1 TABLET BY MOUTH EVERY DAY (Patient taking differently: Take 40 mg by mouth daily.), Disp: 30 tablet, Rfl: 11   polyethylene glycol (MIRALAX / GLYCOLAX) 17 g packet, Take 17 g by mouth daily as needed for moderate constipation or severe constipation., Disp: , Rfl:    potassium chloride (KLOR-CON) 10 MEQ tablet, Take 10 mEq by mouth daily., Disp: , Rfl:    propranolol ER (INDERAL LA) 60 MG 24 hr capsule, Take 1 capsule (60 mg total) by mouth every evening., Disp: 90 capsule, Rfl: 3   rosuvastatin (CRESTOR) 40 MG tablet, Take  1 tablet (40 mg total) by mouth every morning., Disp: 90 tablet, Rfl: 3   SUMAtriptan (IMITREX) 100 MG tablet, Take 100 mg by mouth every 2 (two) hours as needed for migraine. , Disp: , Rfl:    temazepam (RESTORIL) 30 MG capsule, Take 30 mg by mouth at bedtime as needed for sleep., Disp: , Rfl:    traMADol (ULTRAM) 50 MG tablet, Take 50 mg by mouth 3 (three) times daily as needed., Disp: , Rfl:   Exam: Current vital signs: BP 134/77 (BP Location:  Right Arm)   Pulse (!) 51   Temp 97.6 F (36.4 C) (Oral)   Resp 18   Wt 61.8 kg   SpO2 100%   BMI 23.39 kg/m  Vital signs in last 24 hours: Temp:  [97.6 F (36.4 C)] 97.6 F (36.4 C) (10/27 1519) Pulse Rate:  [51] 51 (10/27 1519) Resp:  [18] 18 (10/27 1519) BP: (134)/(77) 134/77 (10/27 1519) SpO2:  [100 %] 100 % (10/27 1519) Weight:  [61.8 kg] 61.8 kg (10/27 1400)  GENERAL: Awake, alert, in no acute distress Psych: Affect appropriate for situation, patient is calm and cooperative with examination Head: Normocephalic and atraumatic, without obvious abnormality EENT: Normal conjunctivae, dry mucous membranes, no OP obstruction LUNGS: Normal respiratory effort. Non-labored breathing on room air CV: Regular rate and rhythm on telemetry ABDOMEN: Soft, non-tender, non-distended Extremities: Warm, well perfused, without obvious deformity  NEURO:  Mental Status: Awake, alert, and oriented to person, place, time, and situation. She is able to provide a clear and coherent history of present illness. Speech/Language: speech is fluent without dysarthria or aphasia No neglect is noted Cranial Nerves:  II: PERRL. Visual fields full.  III, IV, VI: EOMI without gaze preference or nystagmus V: Sensation is intact to light touch and symmetrical to face. Patient does report numbness of her entire bottom lip without tingling  VII: Intermittent subtle left mouth asymmetry  VIII: Hearing is intact to voice IX, X: Palate elevation is symmetric. Phonation normal.  XI: Normal sternocleidomastoid and trapezius muscle strength XII: Tongue protrudes midline without fasciculations.   Motor: 5/5 strength is all muscle groups without vertical drift. No asymmetry.  Tone is normal. Bulk is normal.  Sensation: Intact to light touch bilaterally in all four extremities. No extinction to DSS present.  Coordination: FTN intact bilaterally. HKS intact bilaterally. Gait: Deferred  NIHSS: 1a Level of  Conscious.: 0 1b LOC Questions: 0 1c LOC Commands: 0 2 Best Gaze: 0 3 Visual: 0 4 Facial Palsy: 1 5a Motor Arm - left: 0 5b Motor Arm - Right: 0 6a Motor Leg - Left: 0 6b Motor Leg - Right: 0 7 Limb Ataxia: 0 8 Sensory: 0 9 Best Language: 0 10 Dysarthria: 0 11 Extinct. and Inatten.: 0 TOTAL: 1  Labs I have reviewed labs in epic and the results pertinent to this consultation are: CBC    Component Value Date/Time   WBC 6.1 02/06/2021 1418   RBC 4.71 02/06/2021 1418   HGB 13.6 03/01/2022 1505   HCT 40.0 03/01/2022 1505   PLT 254.0 02/06/2021 1418   MCV 87.3 02/06/2021 1418   MCH 27.7 02/20/2020 2137   MCHC 32.7 02/06/2021 1418   RDW 13.4 02/06/2021 1418   LYMPHSABS 1.5 07/28/2019 1430   MONOABS 0.8 07/28/2019 1430   EOSABS 0.0 07/28/2019 1430   BASOSABS 0.1 07/28/2019 1430   CMP     Component Value Date/Time   NA 143 03/01/2022 1505   K 4.1 03/01/2022 1505  CL 105 03/01/2022 1505   CO2 25 06/15/2021 1622   GLUCOSE 85 03/01/2022 1505   BUN 11 03/01/2022 1505   CREATININE 0.70 03/01/2022 1505   CREATININE 0.94 06/15/2021 1622   CALCIUM 10.6 (H) 06/15/2021 1622   PROT 7.1 02/20/2020 2137   ALBUMIN 3.9 02/20/2020 2137   AST 17 02/20/2020 2137   ALT 16 02/20/2020 2137   ALKPHOS 60 02/20/2020 2137   BILITOT 1.0 02/20/2020 2137   GFRNONAA >60 02/20/2020 2137   GFRAA 56 (L) 07/30/2019 0611   Lipid Panel     Component Value Date/Time   CHOL 126 06/15/2021 1622   TRIG 118 06/15/2021 1622   HDL 44 (L) 06/15/2021 1622   CHOLHDL 2.9 06/15/2021 1622   VLDL 35 07/06/2011 0500   LDLCALC 62 06/15/2021 1622   Lab Results  Component Value Date   HGBA1C 8.2 (A) 11/14/2021   Imaging I have reviewed the images obtained:  CT-scan of the brain 10/27: 1. No acute intracranial abnormality or significant interval change. 2. Stable mild atrophy and white matter disease. This likely reflects the sequela of chronic microvascular ischemia. 3. Remote lacunar infarct in the  inferior right lentiform nucleus. 4. Aspects is 10/10.  MRI examination of the brain pending MRA head and neck pending   Assessment: 66 y.o. female with PMHx of multiple CVAs (2009, 2010, 2011) without residual deficit, type 2 diabetes mellitus, HTN, HLD, MI, CAD, tobacco use, migraine headaches, vitamin B12 deficiency, PAD with occluded left SFA s/p left femoropopliteal bypass graft in 2018 who presets to the ED via EMS for evaluation of acute onset of blurry vision, dizziness, and headache.  - Examination reveals patient with reports of bottom lip numbness and intermittent left mouth asymmetry (chronic per patient, states her lip sometimes "locks up"). NIHSS of 1. Patient reports generalized, holocephalic headache 7/67 in severity with history of migraine headaches.  - CT imaging obtained without acute intracranial abnormality. Unable to obtain CTA due to vascular access complications. Will obtain MRI brain and MRA head and neck.  - Patient is not a candidate for TNK as her initial symptom of lip numbness occurred at 09:00 this morning > 4.5 hours PTA. She is outside of the thrombolytic therapy time window.  - Ddx includes migraine headache versus less likely small acute stroke. Will obtain MRI brain for further evaluation.   Recommendations: - Headache cocktail  - MRI brain  - MRA head and neck wo contrast  - If MRI brain + for acute ischemia, will expand full stroke work up at that time  - If no acute findings on MRI/MRA, no further inpatient neurology recommendations at this time.   [name], AGAC-NP [facility] Pager: [phone]  I have seen the patient reviewed the above note.  She complains of binocular blurred vision that clears with closing either eye.  With her description of her worsening headache, and a history of complicated migraine, I do wonder if this could be playing a role.  That being said, she is very much at risk for ischemic stroke and therefore I  would favor getting an MRI.  If the MRI is negative, and she improves with migraine treatment, then I would not favor any further work-up at this time.  I suspect that she has mildly increased tone in her face since her previous stroke resulting in the symptoms that she describes.  Migraine can also cause recrudescence at times.  I have ordered Reglan 10 mg IV x1 for her headache.  Roland Rack, MD [facility] [phone]  If 7pm- 7am, please page neurology on call as listed in [place].

## 2022-03-01 NOTE — Discharge Instructions (Addendum)
Your CT and MRI scans do not show signs of a new acute stroke.  It is possible you are experiencing a complicated migraine.  This can often cause blurred vision and numbness and tingling in the face.  Your headache and symptoms got better with migraine medicines in the ER.  I recommend you follow-up with your outpatient neurologist for your migraines as soon as possible.  If you have new or worsening symptoms, such as severe headache, numbness or weakness in your arms or legs, severe dizziness, loss of vision, or worsening blurred vision, please return to the ER.

## 2022-03-08 ENCOUNTER — Ambulatory Visit: Payer: Self-pay | Admitting: *Deleted

## 2022-03-08 NOTE — Patient Outreach (Signed)
  Care Coordination   03/08/2022 Name: [name] MRN: [id number] DOB: 1955-06-29   Care Coordination Outreach Attempts:  An unsuccessful telephone outreach was attempted today to offer the patient information about available care coordination services as a benefit of their health plan.   Follow Up Plan:  Additional outreach attempts will be made to offer the patient care coordination information and services.   Encounter Outcome:  No Answer  Care Coordination Interventions Activated:  No   Care Coordination Interventions:  No, not indicated    Emunah Texidor L. Lavina Hamman, RN, BSN, [place] Coordinator Office number [phone]

## 2022-03-12 ENCOUNTER — Encounter: Payer: Self-pay | Admitting: Cardiovascular Disease

## 2022-03-12 ENCOUNTER — Ambulatory Visit: Payer: Medicare Other | Attending: Cardiovascular Disease | Admitting: Cardiovascular Disease

## 2022-03-12 VITALS — BP 104/70 | HR 92 | Ht 63.5 in | Wt 132.4 lb

## 2022-03-12 DIAGNOSIS — E785 Hyperlipidemia, unspecified: Secondary | ICD-10-CM

## 2022-03-12 DIAGNOSIS — I739 Peripheral vascular disease, unspecified: Secondary | ICD-10-CM

## 2022-03-12 DIAGNOSIS — I10 Essential (primary) hypertension: Secondary | ICD-10-CM | POA: Diagnosis not present

## 2022-03-12 DIAGNOSIS — I2510 Atherosclerotic heart disease of native coronary artery without angina pectoris: Secondary | ICD-10-CM

## 2022-03-12 DIAGNOSIS — Z720 Tobacco use: Secondary | ICD-10-CM

## 2022-03-12 DIAGNOSIS — E1149 Type 2 diabetes mellitus with other diabetic neurological complication: Secondary | ICD-10-CM | POA: Diagnosis not present

## 2022-03-12 DIAGNOSIS — E1165 Type 2 diabetes mellitus with hyperglycemia: Secondary | ICD-10-CM | POA: Diagnosis not present

## 2022-03-12 NOTE — Progress Notes (Signed)
03/12/2022 [name]   17-Sep-1955  [id number]  Primary Physician Jennifer Squibb, MD Primary Cardiologist: Jennifer Harp MD [name], [place]  HPI:  [name] is a 66 y.o.   thin-appearing divorced African-American female mother of one, grandmother of one grandchild who is currently disabled because of a prior stroke. She was referred by Dr. Melony [name], from [facility] podiatry, for evaluation and treatment of critical limb ischemia. I last saw her in the office 10/11/2020. Her cardiovascular risk factor profile is notable for a strong family history of heart disease with the father about a stent, mother who had bypass surgery and a sister who died at age 23 of a myocardial infarction. She has never had a heart attack but apparently has had a stroke in the past with some mild left-sided residua. She has a history of tobacco abuse in the last 43 years of one third pack per day trying to quit currently. She has treated diabetes, hypertension and hyperlipidemia. She had the onset of left ear pain approximately  3 months ago with progression to critical limb ischemia in early June with ischemic appearing left fourth toe. Dopplers in our office performed yesterday revealed a left ABI 0.6 with an occluded left SFA and one-vessel runoff. She will need to be admitted for angiography and potential endovascular therapy for critical limb ischemia. Angiogram to her on 11/03/14 revealing occluded left SFA. I performed [facility] one directional atherectomy, PTA and stenting using a Viabahn  covered stent with an excellent angiographic and clinical result. Her pain has resolved. Her critical limb ischemia has  resolved. Her Dopplers have normalized. Since I saw her approximately 6 weeks ago she's had 4 episodes of night nitroglycerin responsive chest pain. Recent Dopplers did show a decline in her left ABI from 1.1 6 months ago to .91 with a simultaneous increase in velocity in her mid left SFA. Dopplers performed  12/20/15 revealed a further reduction in her left ABI down to 0.71 with a high-frequency signal in her mid left SFA and worsening symptoms of claudication    I performed angiography on her 01/29/2016 revealing an occluded left SFA stent.  She ultimately underwent left femoropopliteal bypass grafting 06/09/2016 by Dr. Trula [name] using PTFE.  He follows his noninvasively in his office.  Her symptoms resolved.  She also underwent cardiac catheterization by Dr. [name] because of chest pain 12/26/2016 revealing minimal CAD.  Unfortunately, she has gone back to smoking 10 cigarettes a day.   Since I saw her in the office a year and a half ago she continues to do well.  She is vaping however.  She denies chest pain, shortness of breath or claudication.     Current Meds  Medication Sig   acetaminophen (TYLENOL) 325 MG tablet Take 2 tablets (650 mg total) by mouth every 6 (six) hours as needed for mild pain (or Fever >/= 101).   albuterol (PROAIR HFA) 108 (90 Base) MCG/ACT inhaler 1-2 inhalations every 4-6 hours as needed for cough or wheeze. (Patient taking differently: Inhale 1-2 puffs into the lungs every 4 (four) hours as needed for shortness of breath.)   ALPRAZolam (XANAX) 0.5 MG tablet Take 0.5 mg by mouth 2 (two) times daily as needed for anxiety.   aspirin EC 81 MG tablet Take 1 tablet by mouth daily.   Azelastine HCl 0.15 % SOLN Place 2 sprays into both nostrils 2 (two) times daily. (Patient taking differently: Place 2 sprays into both nostrils 2 (  two) times daily as needed (allergies).)   bisacodyl (DULCOLAX) 5 MG EC tablet Take 5 mg by mouth daily as needed for moderate constipation.   citalopram (CELEXA) 20 MG tablet Take 20 mg by mouth daily.   Continuous Blood Gluc Receiver ([place]) DEVI by Does not apply route.   cyclobenzaprine (FLEXERIL) 10 MG tablet Take 1 tablet by mouth as needed.   doxycycline (VIBRAMYCIN) 100 MG capsule Take 1 capsule (100 mg total) by mouth 2 (two) times daily.  (Patient taking differently: Take 100 mg by mouth as needed.)   empagliflozin (JARDIANCE) 25 MG TABS tablet Take 1 tablet (25 mg total) by mouth daily before breakfast.   EPINEPHrine 0.3 mg/0.3 mL IJ SOAJ injection Inject 0.3 mLs (0.3 mg total) into the muscle once.   fluconazole (DIFLUCAN) 150 MG tablet Take 1 tablet by mouth as needed.   Fluocinolone Acetonide Body 0.01 % OIL Apply topically 2 (two) times daily.   fluticasone (FLONASE) 50 MCG/ACT nasal spray SPRAY 2 SPRAYS INTO EACH NOSTRIL EVERY DAY   Insulin Disposable Pump (OMNIPOD 5 G6 POD, GEN 5,) MISC 1 Device by Other route every 3 (three) days. Change every 72 hours   insulin lispro (HUMALOG) 100 UNIT/ML injection MAX DAILY 100 UNITS PER OMNIPOD   levocetirizine (XYZAL) 5 MG tablet Take 5 mg by mouth daily.   loperamide (IMODIUM) 2 MG capsule Take 2 mg by mouth daily.   loratadine (CLARITIN) 10 MG tablet Take 1 tablet by mouth daily.   losartan-hydrochlorothiazide (HYZAAR) 50-12.5 MG tablet Take 1 tablet by mouth daily.   lubiprostone (AMITIZA) 24 MCG capsule Take 1 capsule (24 mcg total) by mouth 2 (two) times daily with a meal.   Magnesium 200 MG TABS Take 1 tablet (200 mg total) by mouth daily.   montelukast (SINGULAIR) 10 MG tablet Take 10 mg by mouth daily.   mupirocin ointment (BACTROBAN) 2 % Apply 1 application topically 2 (two) times daily. (Patient taking differently: Apply 1 application  topically 2 (two) times daily as needed.)   nitroGLYCERIN (NITROSTAT) 0.4 MG SL tablet PLACE 1 TAB UNDER TONGUE EVERY 5 MINS AS NEEDED FOR CHEST PAIN - MAX 3 DOSES THEN 911   pantoprazole (PROTONIX) 40 MG tablet TAKE 1 TABLET BY MOUTH EVERY DAY (Patient taking differently: Take 40 mg by mouth daily.)   polyethylene glycol (MIRALAX / GLYCOLAX) 17 g packet Take 17 g by mouth daily as needed for moderate constipation or severe constipation.   potassium chloride (KLOR-CON) 10 MEQ tablet Take 10 mEq by mouth daily.   propranolol ER (INDERAL LA) 60  MG 24 hr capsule Take 1 capsule (60 mg total) by mouth every evening.   rosuvastatin (CRESTOR) 40 MG tablet Take 1 tablet (40 mg total) by mouth every morning.   SUMAtriptan (IMITREX) 100 MG tablet Take 100 mg by mouth every 2 (two) hours as needed for migraine.    temazepam (RESTORIL) 30 MG capsule Take 30 mg by mouth at bedtime as needed for sleep.   traMADol (ULTRAM) 50 MG tablet Take 50 mg by mouth 3 (three) times daily as needed.     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   Fish Allergy Hives and Swelling    Tongue swelling   Iodine Swelling   Metformin And Related Diarrhea   [name] [Insulin Glargine-Lixisenatide] Itching and Other (See Comments)    "  tongue swelling"   Adhesive [Tape] Rash    Paper tape is ok   Shellfish Allergy Swelling and Rash    Tongue swelling    Social History   Socioeconomic History   Marital status: Divorced    Spouse name: Not on file   Number of children: 1   Years of education: some college   Highest education level: Some college, no degree  Occupational History   Occupation: Sport and exercise psychologist: UNEMPLOYED    Comment: disabled, notary  Tobacco Use   Smoking status: Every Day    Packs/day: 0.50    Years: 41.00    Total pack years: 20.50    Types: Cigarettes    Last attempt to quit: 03/01/2020    Years since quitting: 2.0   Smokeless tobacco: Never   Tobacco comments:    Getting ready to start nicotine patches RX by Dr. Gwenlyn Found per pt.  Vaping Use   Vaping Use: Never used  Substance and Sexual Activity   Alcohol use: No    Alcohol/week: 0.0 standard drinks of alcohol   Drug use: No    Types: "Crack" cocaine    Comment: 05/09/2015.  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  Divorced, living alone in [place].     01/03/22 working at [employer] wanting to move to [employer]: Low Risk  (02/06/2022)   Overall Financial Resource Strain (CARDIA)    Difficulty of Paying Living Expenses: Not very hard  Food Insecurity: No Food Insecurity (02/06/2022)   Hunger Vital Sign    Worried About Running Out of Food in the Last Year: Never true    Ran Out of Food in the Last Year: Never true  Transportation Needs: No Transportation Needs (08/15/2020)   PRAPARE - Hydrologist (Medical): No    Lack of Transportation (Non-Medical): No  Physical Activity: Not on file  Stress: No Stress Concern Present (08/15/2020)   [place]    Feeling of Stress : Only a little  Social Connections: Moderately Integrated (08/15/2020)   Social Connection and Isolation Panel [NHANES]    Frequency of Communication with Friends and Family: More than three times a week    Frequency of Social Gatherings with Friends and Family: More than three times a week    Attends Religious Services: More than 4 times per year    Active Member of [employer] or Organizations: Yes    Attends Music therapist: More than 4 times per year    Marital Status: Divorced  Intimate Partner Violence: Not At Risk (02/06/2022)   Humiliation, Afraid, Rape, and Kick questionnaire    Fear of Current or Ex-Partner: No    Emotionally Abused: No    Physically Abused: No    Sexually Abused: No     Review of Systems: General: negative for chills, fever, night sweats or weight changes.  Cardiovascular: negative for chest pain, dyspnea on exertion, edema, orthopnea, palpitations,  paroxysmal nocturnal dyspnea or shortness of breath Dermatological: negative for rash Respiratory: negative  for cough or wheezing Urologic: negative for hematuria Abdominal: negative for nausea, vomiting, diarrhea, bright red blood per rectum, melena, or hematemesis Neurologic: negative for visual changes, syncope, or dizziness All other systems reviewed and are otherwise negative except as noted above.    Blood pressure 104/70, pulse 92, height 5' 3.5" (1.613 m), weight 132 lb 6.4 oz (60.1 kg), SpO2 98 %.  General appearance: alert and no distress Neck: no adenopathy, no carotid bruit, no JVD, supple, symmetrical, trachea midline, and thyroid not enlarged, symmetric, no tenderness/mass/nodules Lungs: clear to auscultation bilaterally Heart: regular rate and rhythm, S1, S2 normal, no murmur, click, rub or gallop Extremities: extremities normal, atraumatic, no cyanosis or edema Pulses: Diminished pedal pulses Skin: Skin color, texture, turgor normal. No rashes or lesions Neurologic: Grossly normal  EKG sinus rhythm at 92 with septal Q waves and left axis deviation.  Personally reviewed this EKG.  ASSESSMENT AND PLAN:   Essential hypertension History of essential hypertension blood pressure measured today at 104/70.  She is on losartan/hydrochlorothiazide.  PAD (peripheral artery disease) (HCC) History of PAD with chronic limb ischemia.  She was referred to me by Dr. Melony [name] , her podiatrist for nonhealing wound.  I ultimately performed [facility] 1 directional atherectomy, PTA and stenting using a Viabahn covered stent with excellent result.  I read angiogram to her 01/29/2016 revealing an occluded stent.  She ultimately underwent femoropopliteal bypass grafting using PTFE by Dr. Trula [name] 06/09/2016.  Her most recent Dopplers performed 10/11/2021 revealed this to be patent.  She denies claudication.  Hyperlipidemia LDL goal <70 History of hyperlipidemia on high-dose statin therapy with lipid profile  performed 01/18/2022 revealing total cholesterol 124, LDL 60 and HDL 46.  Mild CAD History of noncritical CAD by cath performed by Dr. [name] 12/26/2016.  She currently denies chest pain.  Tobacco abuse Long history tobacco abuse having quit 2 years ago.  Currently she vapes and has a desire to stop.     Jennifer Harp MD FACP,FACC,FAHA, [facility] 03/12/2022 11:35 AM

## 2022-03-12 NOTE — Patient Instructions (Signed)
Medication Instructions:  Your physician recommends that you continue on your current medications as directed. Please refer to the Current Medication list given to you today.  *If you need a refill on your cardiac medications before your next appointment, please call your pharmacy*   Testing/Procedures: Your physician has requested that you have a lower extremity arterial duplex. During this test, ultrasound is used to evaluate arterial blood flow in the legs. Allow one hour for this exam. There are no restrictions or special instructions. To be done in June 2024. This will take place at [place], [address].    Your physician has requested that you have an ankle brachial index (ABI). During this test an ultrasound and blood pressure cuff are used to evaluate the arteries that supply the arms and legs with blood. Allow thirty minutes for this exam. There are no restrictions or special instructions. To be done in June 2024. This will take place at [place], [address].      Follow-Up: At [facility], you and your health needs are our priority.  As part of our continuing mission to provide you with exceptional heart care, we have created designated Provider Care Teams.  These Care Teams include your primary Cardiologist (physician) and Advanced Practice Providers (APPs -  Physician Assistants and Nurse Practitioners) who all work together to provide you with the care you need, when you need it.  We recommend signing up for the patient portal called "MyChart".  Sign up information is provided on this After Visit Summary.  MyChart is used to connect with patients for Virtual Visits (Telemedicine).  Patients are able to view lab/test results, encounter notes, upcoming appointments, etc.  Non-urgent messages can be sent to your provider as well.   To learn more about what you can do with MyChart, go to [email or web address].    Your next appointment:   12 month(s)  The  format for your next appointment:   In Person  Provider:   Quay Burow, MD

## 2022-03-12 NOTE — Assessment & Plan Note (Signed)
History of essential hypertension blood pressure measured today at 104/70.  She is on losartan/hydrochlorothiazide.

## 2022-03-12 NOTE — Assessment & Plan Note (Signed)
Long history tobacco abuse having quit 2 years ago.  Currently she vapes and has a desire to stop.

## 2022-03-12 NOTE — Assessment & Plan Note (Signed)
History of PAD with chronic limb ischemia.  She was referred to me by Dr. Melony Overly , her podiatrist for nonhealing wound.  I ultimately performed [facility] 1 directional atherectomy, PTA and stenting using a Viabahn covered stent with excellent result.  I read angiogram to her 01/29/2016 revealing an occluded stent.  She ultimately underwent femoropopliteal bypass grafting using PTFE by Dr. Trula Slade 06/09/2016.  Her most recent Dopplers performed 10/11/2021 revealed this to be patent.  She denies claudication.

## 2022-03-12 NOTE — Assessment & Plan Note (Signed)
History of noncritical CAD by cath performed by Dr. [name] 12/26/2016.  She currently denies chest pain.

## 2022-03-12 NOTE — Assessment & Plan Note (Signed)
History of hyperlipidemia on high-dose statin therapy with lipid profile performed 01/18/2022 revealing total cholesterol 124, LDL 60 and HDL 46.

## 2022-03-13 ENCOUNTER — Telehealth: Payer: Self-pay

## 2022-03-13 DIAGNOSIS — T782XXD Anaphylactic shock, unspecified, subsequent encounter: Secondary | ICD-10-CM | POA: Diagnosis not present

## 2022-03-13 DIAGNOSIS — R067 Sneezing: Secondary | ICD-10-CM | POA: Diagnosis not present

## 2022-03-13 DIAGNOSIS — G43109 Migraine with aura, not intractable, without status migrainosus: Secondary | ICD-10-CM | POA: Diagnosis not present

## 2022-03-13 NOTE — Telephone Encounter (Signed)
Patient advised and verbalized understanding 

## 2022-03-13 NOTE — Telephone Encounter (Signed)
Patient left a vm stating that her blood sugars are all over the place.  Patient states that she is taking medication as prescribed. I have place report on your desk.

## 2022-03-14 ENCOUNTER — Encounter: Payer: Self-pay | Admitting: Internal Medicine

## 2022-03-15 ENCOUNTER — Ambulatory Visit: Payer: Self-pay | Admitting: *Deleted

## 2022-03-15 ENCOUNTER — Encounter: Payer: Self-pay | Admitting: *Deleted

## 2022-03-15 NOTE — Patient Outreach (Signed)
  Care Coordination   Follow Up Visit Note   03/15/2022 Name: [name] MRN: [id number] DOB: 19-Jun-1955  [name] is a 66 y.o. year old female who sees [place] Crane, [name], MD for primary care. I spoke with  [name] by phone today.  What matters to the patients health and wellness today?  Still trying to settle in to her new one bed room apartment Recent episode of visual changes at MD office lead to hospital  critical limb ischemia    Stress not engaging with counselor related to limited time  Stressed about downsizing from a 2 to 1 bedroom- got too many stuff & clothes Now on a PT job & applying for a new job Allowed her to verbalize her feelings Children still acting out has great grandson  Returned to church  Stopped smoking but vaping  Increased Xanax taken Wednesday   Increased gastric acid  Loss of weight from 145 to 132 lbs    Her blood sugar dropping on 03/14/22 at work Dexcom monitored at her MD office saw Dr [place] Crane on Wednesday     Goals Addressed               This Visit's Progress     Patient Stated     Find new local providers [facility]) (pt-stated)   Not on track     Care Coordination Interventions: Evaluation of current treatment plan related to local medical providers (relocation) and patient's adherence to plan as established by provider Sent a list of in network providers to patient e-mail address to be viewed and outreach as needed Still continues to look for a provider near new apartment      manage hypoglycemia [facility]) (pt-stated)   Not on track     Care Coordination Interventions: Discussed plans with patient for ongoing care management follow up and provided patient with direct contact information for care management team Screening for signs and symptoms of depression related to chronic disease state  Assessed social determinant of health barriers Assessed for hypoglycemia. Confirmed she continues to have low values  Discussed foods to  have on hand at work Encouragement provided       SDOH assessments and interventions completed:  No  SDOH Interventions Today    Flowsheet Row Most Recent Value  SDOH Interventions   Transportation Interventions Intervention Not Indicated  Stress Interventions Intervention Not Indicated  Social Connections Interventions Intervention Not Indicated        Care Coordination Interventions Activated:  Yes  Care Coordination Interventions:  Yes, provided   Follow up plan: Follow up call scheduled for 05/22/22    Encounter Outcome:  Pt. Visit Completed   Artesia Berkey L. Lavina Hamman, RN, BSN, [place] Coordinator Office number [phone]

## 2022-03-24 DIAGNOSIS — E1165 Type 2 diabetes mellitus with hyperglycemia: Secondary | ICD-10-CM | POA: Diagnosis not present

## 2022-03-24 DIAGNOSIS — Z794 Long term (current) use of insulin: Secondary | ICD-10-CM | POA: Diagnosis not present

## 2022-04-11 DIAGNOSIS — E1165 Type 2 diabetes mellitus with hyperglycemia: Secondary | ICD-10-CM | POA: Diagnosis not present

## 2022-04-11 DIAGNOSIS — E1149 Type 2 diabetes mellitus with other diabetic neurological complication: Secondary | ICD-10-CM | POA: Diagnosis not present

## 2022-04-19 NOTE — Patient Instructions (Addendum)
Visit Information  Thank you for taking time to visit with me today. Please don't hesitate to contact me if I can be of assistance to you.   Following are the goals we discussed today:   Goals Addressed               This Visit's Progress     Patient Stated     Find new local providers [facility] (pt-stated)   Not on track     Care Coordination Interventions: Evaluation of current treatment plan related to local medical providers (relocation) and patient's adherence to plan as established by provider Sent a list of in network providers to patient e-mail address to be viewed and outreach as needed Still continues to look for a provider near new apartment      manage hypoglycemia [facility]) (pt-stated)   Not on track     Care Coordination Interventions: Discussed plans with patient for ongoing care management follow up and provided patient with direct contact information for care management team Screening for signs and symptoms of depression related to chronic disease state  Assessed social determinant of health barriers Assessed for hypoglycemia. Confirmed she continues to have low values  Discussed foods to have on hand at work Encouragement provided        Our next appointment is by telephone on 05/22/2022 at 1130  Please call the care guide team at [phone] if you need to cancel or reschedule your appointment.   If you are experiencing a Mental Health or [place] or need someone to talk to, please call the Suicide and Crisis Lifeline: 988 call the [place] National Suicide Prevention Lifeline: 647-026-8977 or TTY: (432)513-5592 TTY (925) 295-3745) to talk to a trained counselor call 1-800-273-TALK (toll free, 24 hour hotline) go to [facility] [address] [place] [phone]) call 911   Patient verbalizes understanding of instructions and care plan provided today and agrees to view in [place]. Active MyChart status and  patient understanding of how to access instructions and care plan via MyChart confirmed with patient.     The patient has been provided with contact information for the care management team and has been advised to call with any health related questions or concerns.   [name] L. Lavina Hamman, RN, BSN, [place] Coordinator Office number [phone]

## 2022-05-11 DIAGNOSIS — E1149 Type 2 diabetes mellitus with other diabetic neurological complication: Secondary | ICD-10-CM | POA: Diagnosis not present

## 2022-05-11 DIAGNOSIS — E1165 Type 2 diabetes mellitus with hyperglycemia: Secondary | ICD-10-CM | POA: Diagnosis not present

## 2022-05-20 ENCOUNTER — Ambulatory Visit: Payer: Medicare Other | Admitting: Internal Medicine

## 2022-05-22 ENCOUNTER — Encounter: Payer: Self-pay | Admitting: *Deleted

## 2022-05-22 ENCOUNTER — Ambulatory Visit: Payer: Self-pay | Admitting: *Deleted

## 2022-05-22 NOTE — Patient Instructions (Signed)
Visit Information  Thank you for taking time to visit with me today. Please don't hesitate to contact me if I can be of assistance to you.   Following are the goals we discussed today:   Goals Addressed               This Visit's Progress     Find new local providers [facility]) (pt-stated)        Care Coordination Interventions: UPDATE Pt reports she has not completed find a PCP locally but states she will look at the list sent and obtain one soon now that she is settled into her apartment. Pt continue to see Dr. [place] Crane if sometime is needed at this time. No upcoming appointments with last office visit on 03/13/2022.        Just want someone to talk to (pt-stated)        Care Coordination Interventions: Reviewed medications with patient and discussed adherence with no needed refills Provided patient with [employer]er, multimedia)  educational materials related to communication network of senior to talk to when needed. Verified pt has a psychiatry to reach out to if needed for a more in-dept evaluation that involves medication or counseling. Reviewed scheduled/upcoming provider appointments including sufficient transportation source Screening for signs and symptoms of depression related to chronic disease state  Assessed social determinant of health barriers Will add Seniorline to AVS for pt to utilize when needed as a communication hotline of available services.  TeleCare Reassurance Program.  Contact the SeniorLine From [place]: [phone] From [place]: [phone] [email or web address]         manage hypoglycemia ([name]) (pt-stated)        Care Coordination Interventions: UPDATE Reports AM 333 CBG followed by medication with a reduced CBG at 138 with no residual symptoms. Pt states this is her normal reading on some days but improves after her medication. Pt will visit her endocrinologist on Monday to transfer her existing information via Dexcom  to her new Dexcom as pt reports the office captures her readings from her Dexcom and changes her medication regimen accordingly.  Will continue to encourage adherence with medication prescribed, verify aware of hypo-hyperglycemia and what to do if acute symptoms should occur (pt very receptive and aware). Continue to educate on dietary measures (high proteins/low carbs) and reduce stressors that may effect her glucose levels.         Our next appointment is by telephone on 06/21/2022 at 10:00 am  Please call the care guide team at [phone] if you need to cancel or reschedule your appointment.   If you are experiencing a Mental Health or [place] or need someone to talk to, please call the Suicide and Crisis Lifeline: 988 call the [place] National Suicide Prevention Lifeline: 4422147367 or TTY: 340-184-5795 TTY (507)232-7238) to talk to a trained counselor call 1-800-273-TALK (toll free, 24 hour hotline)  Patient verbalizes understanding of instructions and care plan provided today and agrees to view in [place]. Active MyChart status and patient understanding of how to access instructions and care plan via MyChart confirmed with patient.     Raina Mina, RN Care Management Coordinator [place] Office [phone]

## 2022-05-22 NOTE — Patient Outreach (Addendum)
  Care Coordination   Follow Up Visit Note   05/22/2022 Name: [name] MRN: [id number] DOB: 1956-04-08  [name] is a 67 y.o. year old female who sees Nevada Crane, [name], MD for primary care. I spoke with  [name] by phone today.  What matters to the patients health and wellness today?  Need someone to talk to. Ongoing HTN management and Finding a PCP.    Goals Addressed               This Visit's Progress     Find new local providers [facility]) (pt-stated)        Care Coordination Interventions: UPDATE Pt reports she has not completed find a PCP locally but states she will look at the list sent and obtain one soon now that she is settled into her apartment. Pt continue to see Dr. Nevada Crane if sometime is needed at this time. No upcoming appointments with last office visit on 03/13/2022.        Just want someone to talk to (pt-stated)        Care Coordination Interventions: Reviewed medications with patient and discussed adherence with no needed refills Provided patient with Seniorline Designer, multimedia)  educational materials related to communication network of senior to talk to when needed. Verified pt has a psychiatry to reach out to if needed for a more in-dept evaluation that involves medication or counseling. Reviewed scheduled/upcoming provider appointments including sufficient transportation source Screening for signs and symptoms of depression related to chronic disease state  Assessed social determinant of health barriers Will add Seniorline to AVS for pt to utilize when needed as a communication hotline of available services.  TeleCare Reassurance Program.  Contact the SeniorLine From [place]: [phone] From [place]: [phone] [email or web address]         manage hypoglycemia ([name]) (pt-stated)        Care Coordination Interventions: UPDATE Reports AM 333 CBG followed by medication with a reduced CBG at 138 with no  residual symptoms. Pt states this is her normal reading on some days but improves after her medication. Pt will visit her endocrinologist on Monday to transfer her existing information via Dexcom to her new Dexcom as pt reports the office captures her readings from her Dexcom and changes her medication regimen accordingly.  Will continue to encourage adherence with medication prescribed, verify aware of hypo-hyperglycemia and what to do if acute symptoms should occur (pt very receptive and aware). Continue to educate on dietary measures (high proteins/low carbs) and reduce stressors that may effect her glucose levels.         SDOH assessments and interventions completed:  Yes  SDOH Interventions Today    Flowsheet Row Most Recent Value  SDOH Interventions   Food Insecurity Interventions Intervention Not Indicated  Housing Interventions Intervention Not Indicated  Transportation Interventions Intervention Not Indicated  Utilities Interventions Intervention Not Indicated        Care Coordination Interventions:  Yes, provided   Follow up plan: Follow up call scheduled for 06/21/2022 2 10:00 am    Encounter Outcome:  Pt. Visit Completed   Raina Mina, RN Care Management Coordinator [place] Office [phone]

## 2022-05-27 ENCOUNTER — Encounter: Payer: 59 | Attending: Internal Medicine | Admitting: Nutrition

## 2022-05-27 ENCOUNTER — Encounter: Payer: Self-pay | Admitting: Internal Medicine

## 2022-05-27 ENCOUNTER — Ambulatory Visit (INDEPENDENT_AMBULATORY_CARE_PROVIDER_SITE_OTHER): Payer: 59 | Admitting: Internal Medicine

## 2022-05-27 VITALS — BP 152/86 | HR 104 | Ht 63.5 in | Wt 136.8 lb

## 2022-05-27 DIAGNOSIS — E1142 Type 2 diabetes mellitus with diabetic polyneuropathy: Secondary | ICD-10-CM | POA: Diagnosis not present

## 2022-05-27 DIAGNOSIS — E1159 Type 2 diabetes mellitus with other circulatory complications: Secondary | ICD-10-CM

## 2022-05-27 DIAGNOSIS — Z794 Long term (current) use of insulin: Secondary | ICD-10-CM | POA: Diagnosis not present

## 2022-05-27 DIAGNOSIS — E1165 Type 2 diabetes mellitus with hyperglycemia: Secondary | ICD-10-CM | POA: Diagnosis not present

## 2022-05-27 LAB — POCT GLYCOSYLATED HEMOGLOBIN (HGB A1C): Hemoglobin A1C: 7.4 % — AB (ref 4.0–5.6)

## 2022-05-27 MED ORDER — INSULIN LISPRO 100 UNIT/ML IJ SOLN: INTRAMUSCULAR | 3 refills | Status: DC

## 2022-05-27 MED ORDER — ROSUVASTATIN CALCIUM 40 MG PO TABS: 40.0000 mg | ORAL_TABLET | Freq: Every day | ORAL | 3 refills | Status: DC

## 2022-05-27 MED ORDER — OMNIPOD 5 DEXG7G6 PODS GEN 5 MISC: 1.0000 | 3 refills | Status: DC

## 2022-05-27 MED ORDER — EMPAGLIFLOZIN 25 MG PO TABS: 25.0000 mg | ORAL_TABLET | Freq: Every day | ORAL | 3 refills | Status: DC

## 2022-05-27 NOTE — Patient Instructions (Signed)

## 2022-05-27 NOTE — Progress Notes (Signed)
Patient is here today to change her OmniPOd 5 PDM.  She did not know her OmniPod user name and password and so, we were not able to change her PDM.  She will call me tomorrow, once she goes home and calls them to get this information, since she was not able to do this on her phone.  She will call me when she gets this info, and we can reschedule her appointment

## 2022-05-27 NOTE — Progress Notes (Signed)
Name: [name]  Age/ Sex: 67 y.o., female   MRN/ DOB: [id number], Sep 09, 1955     PCP: [name], MD   Reason for Endocrinology Evaluation: Type 2 Diabetes Mellitus  Initial Endocrine Consultative Visit: 04/17/2020    PATIENT IDENTIFIER: [name] is a 67 y.o. female with a past medical history of T2DM, PAD ,CAD, HTN and Hx of pancreatitis  . The patient has followed with Endocrinology clinic since 04/17/2020 for consultative assistance with management of her diabetes.  DIABETIC HISTORY:  [name] was diagnosed with DM in 2003, She is intolerant to Metformin due to diarrhea and Soliqua due to itching . Her hemoglobin A1c has ranged from 7.1%  in 20, peaking at 9.5% in 2015  Has chronic GI issues - Dr Carlean Purl   Islet Ab's and GAD-65 negative   Jardiance started 07/2020 Omnipod started 08/2020   SUBJECTIVE:   During the last visit (11/14/2021) :  A1c 8.2%   Today (05/27/2022): [name] is here for diabetes follow up .  She checks her blood sugars multiple times a day through CGM. The patient has not had hypoglycemic episodes since the last clinic visit.  She was seen by vascular on 11/02/2021 for PAD, patent F-P bypass graft She continues with heartburn symptoms, has nausea this morning   This patient with type 2 diabetes is treated with Omnipod (insulin pump). During the visit the pump basal and bolus doses were reviewed including carb/insulin rations and supplemental doses. The clinical list was updated. The glucose meter download was reviewed in detail to determine if the current pump settings are providing the best glycemic control without excessive hypoglycemia.     Pump and meter download:    Pump   Omnipod Settings   Insulin type   Humalog    Basal rate       0000  1.60 u/h    0400 1.60   0800 2.05   2200 2.0      I:C ratio       0000 1:7                  Sensitivity       0000  15      Goal       0000  120             Type & Model of Pump: Omnipod Insulin Type: Currently using Humalog .     PUMP STATISTICS: Average BG: 150 Average Daily Carbs (g):70.9 Average Total Daily Insulin:44.2 Average Daily Basal: 25.9 (59 %) Average Daily Bolus: 18.3(41 %)   CONTINUOUS GLUCOSE MONITORING RECORD INTERPRETATION    Dates of Recording: 1/9-1/22/2024  Sensor description:dexcom  Results statistics:   CGM use % of time 91.8  Average and SD 150/45  Time in range      75  %  % Time Above 180 23  % Time above 250 2  % Time Below target 0   Glycemic patterns summary: BG's trend down overnight , hyperglycemia variable during the day   Hyperglycemic episodes  postprandial   Hypoglycemic episodes occurred around 4 am and 3 pm  Overnight periods: trends down    HOME DIABETES REGIMEN:  Humalog Jardiance 25 mg daily     Statin: yes ACE-I/ARB: yes      DIABETIC COMPLICATIONS: Microvascular complications:  Neuropathy  Denies: CKD, retinopathy Last Eye Exam: Completed 12/27/2021  Macrovascular complications:  PVD ( S/P femoral-popliteal bypass), Mild CAD  Denies: CAD,  CVA   HISTORY:  Past Medical History:  Past Medical History:  Diagnosis Date   Allergic urticaria 07/10/2015   Allergy    Anemia    hx   Angioedema 07/10/2015   Anxiety    Arthritis    "neck, left hand" (09/14/2012)   Asthma    Cancer ([place]) 1985   ovarian, no treatment except surgery   Cataract    Complication of anesthesia    [place] TO RIGHT   Critical lower limb ischemia ([place])    10/2014 s/p L SFA stenting   Diverticulosis of colon with hemorrhage April 2013   GERD (gastroesophageal reflux disease)    GI bleed    H/O hiatal hernia    Heart attack ([place])    2003 mild MI, March 2013 mild MI   Hidradenitis    groin   History of blood transfusion 1985 AND 2013   Hyperlipidemia    Hypertension    Irritable bowel syndrome    Left-sided weakness    since stroke, left eye  trouble seeing   Migraines    Mild CAD    a. Cath 09/2010: mild luminal irregularities of LAD, 30% prox RCA and 20-30% mRCA, EF 65%.   Neuropathy    Obesity    Osteoporosis    PAD (peripheral artery disease) ([place])    a. critical limb ischemia s/p PTA/stenting of L SFA 10/2014. c. occ prior SFA stent by angio 01/2016, for possible PV bypass.   Pneumonia    baby   Recurrent upper respiratory infection (URI)    S/P arterial stent-mid Lt SFA 11/03/14 11/04/2014   Schatzki's ring    Sinus problem    Stomach ulcer 1972   non-bleeding   Stroke ([place]) 07-2007, 07-2008, 07-2009   total 3 strokes; mild left sided weakness and left eye "jumps".   Type II diabetes mellitus ([place])    Vitamin B 12 deficiency 07-15-2013   Past Surgical History:  Past Surgical History:  Procedure Laterality Date   ABDOMINAL HYSTERECTOMY  1985   ADENOIDECTOMY     ANTERIOR CERVICAL DECOMP/DISCECTOMY FUSION  2002   ANTERIOR CERVICAL DECOMP/DISCECTOMY FUSION N/A 04/08/2014   Procedure: Cervical Six-Seven ANTERIOR CERVICAL DECOMPRESSION/DISCECTOMY FUSION Plating and Bonegraft  2 LEVELS;  Surgeon: Ashok Pall, MD;  Location: [facility];  Service: Neurosurgery;  Laterality: N/A;  Cervical Six-Seven ANTERIOR CERVICAL DECOMPRESSION/DISCECTOMY FUSION Plating and Bonegraft  2 LEVELS   APPENDECTOMY  1985   AXILLARY HIDRADENITIS EXCISION  1990-2008   bilateral   BACK SURGERY     BREAST BIOPSY Right 2007   BREAST CYST EXCISION Right 2008   BREAST REDUCTION SURGERY     CARDIAC CATHETERIZATION  2004   mild disease   CATARACT EXTRACTION W/PHACO Left 05/16/2015   Procedure: CATARACT EXTRACTION PHACO AND INTRAOCULAR LENS PLACEMENT (IOC);  Surgeon: Rutherford Guys, MD;  [facility]: [facility];  Service: Ophthalmology;  Laterality: Left;  CDE: 4.24   CATARACT EXTRACTION W/PHACO Right 05/30/2015   Procedure: CATARACT EXTRACTION RIGHT EYE PHACO AND INTRAOCULAR LENS PLACEMENT ;  Surgeon: Rutherford Guys, MD;  Location: [facility];  Service: Ophthalmology;   Laterality: Right;  CDE:4.08   CHOLECYSTECTOMY  1990's   COLONOSCOPY  08/12/2011   Procedure: COLONOSCOPY;  Surgeon: Ladene Artist, MD,FACG;  Location[facility];  Service: Endoscopy;  Laterality: N/A;   COLONOSCOPY  06/19/2006   COLONOSCOPY WITH PROPOFOL N/A 07/02/2019   Procedure: COLONOSCOPY WITH PROPOFOL;  Surgeon: Rogene Houston, MD;  [facility];  Service: Endoscopy;  Laterality: N/A;   cyst thigh Right    ESOPHAGOGASTRODUODENOSCOPY  08/12/2011   Procedure: ESOPHAGOGASTRODUODENOSCOPY (EGD);  Surgeon: Ladene Artist, MD,FACG;  Location: [facility] ENDOSCOPY;  Service: Endoscopy;  Laterality: N/A;   ESOPHAGOGASTRODUODENOSCOPY  06/04/2005   ESOPHAGOGASTRODUODENOSCOPY (EGD) WITH PROPOFOL N/A 07/01/2019   Procedure: ESOPHAGOGASTRODUODENOSCOPY (EGD) WITH PROPOFOL;  Surgeon: Rogene Houston, MD;  [facility];  Service: Endoscopy;  Laterality: N/A;   FEMORAL-POPLITEAL BYPASS GRAFT Left 06/19/2016   Procedure: Left Leg BYPASS GRAFT FEMORAL-POPLITEAL ARTERY;  Surgeon: Serafina Mitchell, MD;  Location: [facility];  Service: Vascular;  Laterality: Left;   GIVENS CAPSULE STUDY  08/13/2011   Procedure: GIVENS CAPSULE STUDY;  Surgeon: Ladene Artist, MD,FACG;  Location: [facility] ENDOSCOPY;  Service: Endoscopy;  Laterality: N/A;   HAMMER TOE SURGERY Bilateral ~ 2000   HEMOSTASIS CLIP PLACEMENT  07/02/2019   Procedure: HEMOSTASIS CLIP PLACEMENT;  Surgeon: Rogene Houston, MD;  [facility];  Service: Endoscopy;;  hepatic flexure   HIATAL HERNIA REPAIR     HYDRADENITIS EXCISION  01/2011; 03/2012   'groin and abdomen; 03/2012" (09/14/2012)   HYDRADENITIS EXCISION  04/01/2012   Procedure: EXCISION HYDRADENITIS GROIN;  Surgeon: Pedro Earls, MD;  Location: [facility];  Service: General;  Laterality: Bilateral;  Excision of Hydradenitis of Perineum   HYDRADENITIS EXCISION N/A 09/17/2013   Procedure: EXCISION PERINEAL HIDRADENITIS ;  Surgeon: Pedro Earls, MD;  Location: [facility];  Service:  General;  Laterality: N/A;  also in the pubis area   LEFT HEART CATH AND CORONARY ANGIOGRAPHY N/A 12/26/2016   Procedure: LEFT HEART CATH AND CORONARY ANGIOGRAPHY;  Surgeon: [name], Peter M, MD;  Location: [facility];  Service: Cardiovascular;  Laterality: N/A;   MASS EXCISION Right 09/17/2013   Procedure: EXCISION MASS;  Surgeon: Pedro Earls, MD;  Location: [facility];  Service: General;  Laterality: Right;   NISSEN FUNDOPLICATION  0240   PERIPHERAL VASCULAR CATHETERIZATION N/A 11/03/2014   Procedure: Lower Extremity Angiography;  Surgeon: Lorretta Harp, MD;  Location: [facility];  Service: Cardiovascular;  Laterality: N/A;   PERIPHERAL VASCULAR CATHETERIZATION N/A 01/29/2016   Procedure: Lower Extremity Angiography;  Surgeon: Lorretta Harp, MD;  Location: [facility];  Service: Cardiovascular;  Laterality: N/A;   POLYPECTOMY  07/02/2019   Procedure: POLYPECTOMY;  Surgeon: Rogene Houston, MD;  [facility];  Service: Endoscopy;;   POSTERIOR LUMBAR FUSION  2008 X 2   REDUCTION MAMMAPLASTY  1996?   TONSILLECTOMY AND ADENOIDECTOMY  1959 AND 2000   UVULOPALATOPHARYNGOPLASTY, TONSILLECTOMY AND SEPTOPLASTY  2000's   Social History:  reports that she has been smoking cigarettes. She has a 20.50 pack-year smoking history. She has never used smokeless tobacco. She reports that she does not drink alcohol and does not use drugs. Family History:  Family History  Problem Relation Age of Onset   Breast cancer Mother    Heart disease Mother    Hypertension Mother    Diabetes Father    Heart disease Father    Stroke Father    Heart disease Sister    Hypertension Sister    Hypertension Sister    Hyperlipidemia Sister    Diabetes Sister    Allergic rhinitis Sister    Emphysema Other        great uncle   Aneurysm Sister        brain   Colon cancer Paternal Uncle    Angioedema  Neg Hx    Asthma Neg Hx    Eczema Neg Hx    Immunodeficiency Neg Hx    Urticaria  Neg Hx      HOME MEDICATIONS: Allergies as of 05/27/2022       Reactions   Sulfa Antibiotics Shortness Of Breath, Palpitations   Codeine Other (See Comments)   Recovering Addict does not like to take Narcotics   Fish Allergy Hives, Swelling   Tongue swelling   Iodine Swelling   Metformin And Related Diarrhea   Soliqua [insulin Glargine-lixisenatide] Itching, Other (See Comments)   "tongue swelling"   Adhesive [tape] Rash   Paper tape is ok   Shellfish Allergy Swelling, Rash   Tongue swelling        Medication List        Accurate as of May 27, 2022 11:29 AM. If you have any questions, ask your nurse or doctor.          acetaminophen 325 MG tablet Commonly known as: TYLENOL Take 2 tablets (650 mg total) by mouth every 6 (six) hours as needed for mild pain (or Fever >/= 101).   albuterol 108 (90 Base) MCG/ACT inhaler Commonly known as: ProAir HFA 1-2 inhalations every 4-6 hours as needed for cough or wheeze. What changed:  how much to take how to take this when to take this reasons to take this additional instructions   ALPRAZolam 0.5 MG tablet Commonly known as: XANAX Take 0.5 mg by mouth 2 (two) times daily as needed for anxiety.   aspirin EC 81 MG tablet Take 1 tablet by mouth daily.   Azelastine HCl 0.15 % Soln Place 2 sprays into both nostrils 2 (two) times daily. What changed:  when to take this reasons to take this   bisacodyl 5 MG EC tablet Commonly known as: DULCOLAX Take 5 mg by mouth daily as needed for moderate constipation.   citalopram 20 MG tablet Commonly known as: CELEXA Take 20 mg by mouth daily.   clopidogrel 75 MG tablet Commonly known as: PLAVIX Take 1 tablet (75 mg total) by mouth daily.   cyclobenzaprine 10 MG tablet Commonly known as: FLEXERIL Take 1 tablet by mouth as needed.   Dexcom G6 Receiver Devi by Does not apply route.   doxycycline 100 MG capsule Commonly known as: VIBRAMYCIN Take 1 capsule (100 mg  total) by mouth 2 (two) times daily. What changed:  when to take this reasons to take this   empagliflozin 25 MG Tabs tablet Commonly known as: Jardiance Take 1 tablet (25 mg total) by mouth daily before breakfast.   EPINEPHrine 0.3 mg/0.3 mL Soaj injection Commonly known as: EPI-PEN Inject 0.3 mLs (0.3 mg total) into the muscle once.   fluconazole 150 MG tablet Commonly known as: DIFLUCAN Take 1 tablet by mouth as needed.   Fluocinolone Acetonide Body 0.01 % Oil Apply topically 2 (two) times daily.   fluticasone 50 MCG/ACT nasal spray Commonly known as: FLONASE SPRAY 2 SPRAYS INTO EACH NOSTRIL EVERY DAY   insulin lispro 100 UNIT/ML injection Commonly known as: HUMALOG MAX DAILY 100 UNITS PER OMNIPOD   levocetirizine 5 MG tablet Commonly known as: XYZAL Take 5 mg by mouth daily.   loperamide 2 MG capsule Commonly known as: IMODIUM Take 2 mg by mouth daily.   loratadine 10 MG tablet Commonly known as: CLARITIN Take 1 tablet by mouth daily.   losartan-hydrochlorothiazide 50-12.5 MG tablet Commonly known as: Hyzaar Take 1 tablet by mouth daily.  lubiprostone 24 MCG capsule Commonly known as: AMITIZA Take 1 capsule (24 mcg total) by mouth 2 (two) times daily with a meal.   Magnesium 200 MG Tabs Take 1 tablet (200 mg total) by mouth daily.   montelukast 10 MG tablet Commonly known as: SINGULAIR Take 10 mg by mouth daily.   mupirocin ointment 2 % Commonly known as: BACTROBAN Apply 1 application topically 2 (two) times daily. What changed:  when to take this reasons to take this   nitroGLYCERIN 0.4 MG SL tablet Commonly known as: NITROSTAT PLACE 1 TAB UNDER TONGUE EVERY 5 MINS AS NEEDED FOR CHEST PAIN - MAX 3 DOSES THEN 911   Omnipod 5 G6 Pod (Gen 5) Misc 1 Device by Other route every 3 (three) days. Change every 72 hours   pantoprazole 40 MG tablet Commonly known as: PROTONIX TAKE 1 TABLET BY MOUTH EVERY DAY   polyethylene glycol 17 g  packet Commonly known as: MIRALAX / GLYCOLAX Take 17 g by mouth daily as needed for moderate constipation or severe constipation.   potassium chloride 10 MEQ tablet Commonly known as: KLOR-CON Take 10 mEq by mouth daily.   propranolol ER 60 MG 24 hr capsule Commonly known as: INDERAL LA Take 1 capsule (60 mg total) by mouth every evening.   rosuvastatin 40 MG tablet Commonly known as: CRESTOR Take 1 tablet (40 mg total) by mouth every morning.   SUMAtriptan 100 MG tablet Commonly known as: IMITREX Take 100 mg by mouth every 2 (two) hours as needed for migraine.   temazepam 30 MG capsule Commonly known as: RESTORIL Take 30 mg by mouth at bedtime as needed for sleep.   traMADol 50 MG tablet Commonly known as: ULTRAM Take 50 mg by mouth 3 (three) times daily as needed.         OBJECTIVE:   Vital Signs: BP (!) 152/86 (BP Location: Left Arm, Patient Position: Sitting, Cuff Size: Normal)   Pulse (!) 104   Ht 5' 3.5" (1.613 m)   Wt 136 lb 12.8 oz (62.1 kg)   SpO2 93%   BMI 23.85 kg/m   Wt Readings from Last 3 Encounters:  05/27/22 136 lb 12.8 oz (62.1 kg)  03/12/22 132 lb 6.4 oz (60.1 kg)  03/01/22 136 lb 3.9 oz (61.8 kg)     Exam: General: Pt appears well and is in NAD  Lungs: Clear with good BS bilat with no rales, rhonchi, or wheezes  Heart: RRR with normal S1 and S2 and no gallops; no murmurs; no rub  Extremities: No pretibial edema.  Neuro: MS is good with appropriate affect, pt is alert and Ox3      DM foot exam: 05/27/2022   The skin of the feet is intact without sores or ulcerations. The pedal pulses are 1 + bilaterally  The sensation is intact to a screening 5.07, 10 gram monofilament bilaterally      DATA REVIEWED:   Latest Reference Range & Units 03/01/22 15:05  Sodium 135 - 145 mmol/L 143  Potassium 3.5 - 5.1 mmol/L 4.1  Chloride 98 - 111 mmol/L 105  Glucose 70 - 99 mg/dL 85  BUN 8 - 23 mg/dL 11  Creatinine 0.44 - 1.00 mg/dL 0.70   Calcium Ionized 1.15 - 1.40 mmol/L 1.23   01/18/2022 Tg 96 LDL 60 GFR 73  Glutamic Acid Decarb Ab <5 IU/mL <5     ISLET CELL ANTIBODY SCREEN Negative  ASSESSMENT / PLAN / RECOMMENDATIONS:   1) Type 2 Diabetes  Mellitus, with poorly controlled, 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 Goal A1c < 7.0 %.    -I have praised the patient improved glycemic control -In reviewing pump/CGM data the patient has been noted with hypoglycemia at night and at variable times during the day, as well as postprandial hyperglycemia -Based on the above information, I have reduced her basal rate and adjusted her insulin to carb ratio -She was encouraged to continue to bolus with each meal -Has Hx of pancreatitis in 2010, so DPP-4 inhibitors and GLp-1 agonists are  CONTRAINDICATED - GAD-65 and Islet cell Ab's negative     MEDICATIONS: continue  Jardiance 25 mg daily  Humalog   Pump   Omnipod Settings   Insulin type   Humalog    Basal rate       0000  1.40 u/h    0400 1.55   0800 2.00   2200 2.0      I:C ratio       0000 1:6                  Sensitivity       0000  15      Goal       0000  120         EDUCATION / INSTRUCTIONS: BG monitoring instructions: Patient is instructed to check her blood sugars 4 times a day, before meals and bedtime . Call [facility] if: BG persistently < 70  I reviewed the Rule of 15 for the treatment of hypoglycemia in detail with the patient. Literature supplied.   2) Diabetic complications:  Eye: Does not have known diabetic retinopathy.  Neuro/ Feet: Does  have known diabetic peripheral neuropathy .  Renal: Patient does not have known baseline CKD.    3) Dyslipidemia:  - LDL has been at goal  - No changes     Medication Continue rosuvastatin 40 mg daily    4) Microalbuminuria :   -  MA/Cr ratio slightly elevated at 48 in February 2023.  - She is on Losartan-  HCTZ  -We will continue to monitor     F/U 6 months   Signed electronically by: Mack Guise, MD  [facility] Endocrinology  [facility] [address] [place], [place] [address] Phone: [phone] FAX: [phone]   CC: [name], MD [place] [address] Phone: [phone]  Fax: [phone]  Return to Endocrinology clinic as below: Future Appointments  Date Time Provider [place]  05/27/2022 11:30 AM Ocie Doyne, RN [place] [facility]  06/21/2022 10:00 AM Tobi Bastos, RN [name] None

## 2022-05-28 ENCOUNTER — Ambulatory Visit: Payer: Medicare Other | Admitting: Cardiovascular Disease

## 2022-05-28 ENCOUNTER — Encounter: Payer: Self-pay | Admitting: Internal Medicine

## 2022-06-05 DIAGNOSIS — E1149 Type 2 diabetes mellitus with other diabetic neurological complication: Secondary | ICD-10-CM | POA: Diagnosis not present

## 2022-06-05 DIAGNOSIS — E1165 Type 2 diabetes mellitus with hyperglycemia: Secondary | ICD-10-CM | POA: Diagnosis not present

## 2022-06-21 ENCOUNTER — Ambulatory Visit: Payer: Self-pay | Admitting: *Deleted

## 2022-06-21 NOTE — Patient Instructions (Addendum)
Visit Information  Thank you for taking time to visit with me today. Please don't hesitate to contact me if I can be of assistance to you.   Here is a link to [facility] in [place] [phone] [email or web address]=[email or web address]&long=-[phone]&propFlow=true&sort=distance&specialtyCategory=56   Recommended sites by [facility] care guides for legal aid [email or web address]  [facility] [address] [place] [address] [place] [phone] ex 2002  [email or web address]  Following are the goals we discussed today:   Goals Addressed               This Visit's Progress     Patient Stated     Find local providers [facility]) (pt-stated)   Not on track     Care Coordination Interventions:  Resent resources from 2023 and new resources for 2024 per her insurance plan to her my chart and confirmed email address      Just want someone to talk to [facility]) (pt-stated)   Not on track     Care Coordination Interventions: Seniorline to AVS for pt to utilize when needed as a communication hotline of available services.   TeleCare Reassurance Program.  Contact the SeniorLine From [place]: [phone] From [place]: [phone] [email or web address]   Interventions Today    Flowsheet Row Most Recent Value  Chronic Disease   Chronic disease during today's visit --   apartment complex concerns/legal counseling, coping resources offered in january 2024. reduction of stress/coping interventions]  General Interventions   General Interventions Discussed/Reviewed Health Screening, Community Resources  [SW/care guide for legal counseling resources related to previous apartment complex concerns   Previous complex wanting her to pay for carpet cleaning +]           Mental Health Interventions   Mental Health Discussed/Reviewed Coping Strategies, Other, Mental Health Reviewed  [referral to SW/care guide for resources, resent [place] senior resources info]       manage hypoglycemia ([name]) (pt-stated)   On track     Care Coordination Interventions: UPDATE Continues use of her Dexcom and changes her medication regimen accordingly.  Will continue to encourage adherence with medication prescribed, verify aware of hypo-hyperglycemia and what to do if acute symptoms should occur  Interventions Today    Flowsheet Row Most Recent Value  Chronic Disease   Chronic disease during today's visit --   commended for decrease in HgA1c from 8.2 to 7.4 plus reduction of stress/coping interventions]         Our next appointment is by telephone on 07/19/22 at 1000  Please call the care guide team at [phone] if you need to cancel or reschedule your appointment.   If you are experiencing a Mental Health or [place] or need someone to talk to, please call the Suicide and Crisis Lifeline: 988 call the [place] National Suicide Prevention Lifeline: 336-068-0742 or TTY: (909) 445-2042 TTY 747-361-3048) to talk to a trained counselor call 1-800-273-TALK (toll free, 24 hour hotline) go to [facility] [address] [place] [phone]) call 911   Patient verbalizes understanding of instructions and care plan provided today and agrees to view in [place]. Active MyChart status and patient understanding of how to access instructions and care plan via MyChart confirmed with patient.     The patient has been provided with contact information for the care management team and has been advised to call with any health related questions or concerns.    [name] L.  Lavina Hamman, RN, BSN, [place] Coordinator Office number [phone]

## 2022-06-21 NOTE — Patient Outreach (Signed)
  Care Coordination   Follow Up Visit Note   06/21/2022 Name: [name] MRN: [id number] DOB: November 14, 1955  [name] is a 67 y.o. year old female who sees [place] Crane, [name], MD for primary care. I spoke with  [name] by phone today.  What matters to the patients health and wellness today?  PCP remains in [place], concerns with previous apartment complex,   Confirmed her January 2024 outreach with this RN CM's peer, Charlie Pitter  Has not followed up on resources provided by Charlie Pitter  GI symptoms- abdominal pain, Irritable bowel syndrome (IBS) July 17 2022 to be seen by gastroenterology Dr Carlean Purl She discussed her 03/01/22 visit to Dr Carlean Purl that lead to an ED visit for r/o stroke and diagnosis of migraine  SW/care guide for legal counseling resources related to previous apartment complex concerns Previous complex wanting her to pay for carpet cleaning +  Diabetes HgA1c decreased from 8.2 to 7.4  Stress decreased since December 2023 related to her social relationships with her family and female friend   Goals Addressed               This Visit's Progress     Patient Stated     Find local providers [facility]) (pt-stated)   Not on track     Care Coordination Interventions:  Resent resources from 2023 and new resources for 2024 per her insurance plan to her my chart and confirmed email address      Just want someone to talk to [facility]) (pt-stated)   Not on track     Care Coordination Interventions: Seniorline to AVS for pt to utilize when needed as a communication hotline of available services.   TeleCare Reassurance Program.  Contact the SeniorLine From [place]: [phone] From [place]: [phone] [email or web address]   Interventions Today    Flowsheet Row Most Recent Value  Chronic Disease   Chronic disease during today's visit --   apartment complex concerns/legal counseling, coping resources offered in  january 2024. reduction of stress/coping interventions]  General Interventions   General Interventions Discussed/Reviewed Health Screening, Community Resources  [SW/care guide for legal counseling resources related to previous apartment complex concerns  Previous complex wanting her to pay for carpet cleaning +]           Mental Health Interventions   Mental Health Discussed/Reviewed Coping Strategies, Other, Mental Health Reviewed  [referral to SW/care guide for resources, [place] senior resources info]       manage hypoglycemia ([name]) (pt-stated)   On track     Care Coordination Interventions: UPDATE Continues use of her Dexcom and changes her medication regimen accordingly.  Will continue to encourage adherence with medication prescribed, verify aware of hypo-hyperglycemia and what to do if acute symptoms should occur  Interventions Today    Flowsheet Row Most Recent Value  Chronic Disease   Chronic disease during today's visit --   commended for decrease in HgA1c from 8.2 to 7.4 plus reduction of stress/coping interventions]         SDOH assessments and interventions completed:  Yes     Care Coordination Interventions:  Yes, provided   Follow up plan: Follow up call scheduled for 07/19/22    Encounter Outcome:  Pt. Visit Completed   Evangeline Utley L. Lavina Hamman, RN, BSN, [place] Coordinator Office number [phone]

## 2022-07-05 DIAGNOSIS — E1165 Type 2 diabetes mellitus with hyperglycemia: Secondary | ICD-10-CM | POA: Diagnosis not present

## 2022-07-05 DIAGNOSIS — E1149 Type 2 diabetes mellitus with other diabetic neurological complication: Secondary | ICD-10-CM | POA: Diagnosis not present

## 2022-07-11 ENCOUNTER — Ambulatory Visit: Payer: Self-pay | Admitting: *Deleted

## 2022-07-11 NOTE — Patient Outreach (Addendum)
  Care Coordination   Follow Up Visit Note   07/11/2022 Name: [name] MRN: [id number] DOB: July 06, 1955  [name] is a 67 y.o. year old female who sees Nevada Crane, [name], MD for primary care. I spoke with  [name] by phone today.   What matters to the patients health and wellness today?  Ronnald Ramp on [address] [place] 07/17/22   Goals Addressed               This Visit's Progress     Patient Stated     Find local providers [facility]) (pt-stated)   On track     Care Coordination Interventions:  Interventions Today    Flowsheet Row Most Recent Value  Chronic Disease   Chronic disease during today's visit Other  [received a message from patient, established care in [place], progressing, new promotion with increase work hours, returned her call, spoke briefly prior to an incoming call]  General Interventions   General Interventions Discussed/Reviewed General Interventions Reviewed, Doctor Visits  [place] care near her new home]  Doctor Visits Discussed/Reviewed PCP  [[facility]]             SDOH assessments and interventions completed:  No    Care Coordination Interventions:  Yes, provided   Follow up plan: Follow up call scheduled for 07/18/22    Encounter Outcome:  Pt. Visit Completed    Shir Bergman L. Lavina Hamman, RN, BSN, [place] Coordinator Office number [phone]

## 2022-07-11 NOTE — Patient Instructions (Addendum)
Visit Information  Thank you for taking time to visit with me today. Please don't hesitate to contact me if I can be of assistance to you.   Following are the goals we discussed today:   Goals Addressed               This Visit's Progress     Patient Stated     Find local providers [facility]) (pt-stated)   On track     Care Coordination Interventions:  Interventions Today    Flowsheet Row Most Recent Value  Chronic Disease   Chronic disease during today's visit Other  [received a message from patient, established care in [place], progressing, new promotion with increase work hours, returned her call, spoke briefly prior to an incoming call]  General Interventions   General Interventions Discussed/Reviewed General Interventions Reviewed, Doctor Visits  [place] care near her new home]  Doctor Visits Discussed/Reviewed PCP  [[facility]]             Our next appointment is by telephone on 07/18/22 at 330 pm  Please call the care guide team at [phone] if you need to cancel or reschedule your appointment.   If you are experiencing a Mental Health or [place] or need someone to talk to, please call the Suicide and Crisis Lifeline: 988 call the [place] National Suicide Prevention Lifeline: 425-353-9715 or TTY: (408) 546-4226 TTY 903-528-5948) to talk to a trained counselor call 1-800-273-TALK (toll free, 24 hour hotline) go to [facility] [address] [place] [phone]) call 911   Patient verbalizes understanding of instructions and care plan provided today and agrees to view in [place]. Active MyChart status and patient understanding of how to access instructions and care plan via MyChart confirmed with patient.     The patient has been provided with contact information for the care management team and has been advised to call with any health related questions or concerns.   Gloriajean Okun L. Lavina Hamman, RN, BSN, [place] Coordinator Office number [phone]

## 2022-07-12 DIAGNOSIS — M25552 Pain in left hip: Secondary | ICD-10-CM | POA: Diagnosis not present

## 2022-07-12 DIAGNOSIS — M25551 Pain in right hip: Secondary | ICD-10-CM | POA: Diagnosis not present

## 2022-07-17 ENCOUNTER — Ambulatory Visit (INDEPENDENT_AMBULATORY_CARE_PROVIDER_SITE_OTHER): Payer: 59 | Admitting: Internal Medicine

## 2022-07-17 ENCOUNTER — Encounter: Payer: Self-pay | Admitting: Internal Medicine

## 2022-07-17 ENCOUNTER — Other Ambulatory Visit: Payer: Self-pay | Admitting: Internal Medicine

## 2022-07-17 VITALS — BP 144/88 | HR 72 | Ht 63.5 in | Wt 135.4 lb

## 2022-07-17 DIAGNOSIS — R1013 Epigastric pain: Secondary | ICD-10-CM

## 2022-07-17 DIAGNOSIS — Z9889 Other specified postprocedural states: Secondary | ICD-10-CM | POA: Diagnosis not present

## 2022-07-17 MED ORDER — SUCRALFATE 1 G PO TABS: 1.0000 g | ORAL_TABLET | Freq: Three times a day (TID) | ORAL | 1 refills | Status: DC

## 2022-07-17 NOTE — Telephone Encounter (Signed)
Is this okay with you Sir? Thank you. 

## 2022-07-17 NOTE — Patient Instructions (Signed)
We have sent the following medications to your pharmacy for you to pick up at your convenience: Carafate  _______________________________________________________  If your blood pressure at your visit was 140/90 or greater, please contact your primary care physician to follow up on this.  _______________________________________________________  If you are age 68 or older, your body mass index should be between 23-30. Your Body mass index is 23.61 kg/m. If this is out of the aforementioned range listed, please consider follow up with your Primary Care Provider.  If you are age 64 or younger, your body mass index should be between 19-25. Your Body mass index is 23.61 kg/m. If this is out of the aformentioned range listed, please consider follow up with your Primary Care Provider.   ________________________________________________________  The [facility] providers would like to encourage you to use [facility] to communicate with providers for non-urgent requests or questions.  Due to long hold times on the telephone, sending your provider a message by [facility] may be a faster and more efficient way to get a response.  Please allow 48 business hours for a response.  Please remember that this is for non-urgent requests.  _______________________________________________________  I appreciate the opportunity to care for you. Silvano Rusk, MD, [facility]

## 2022-07-17 NOTE — Telephone Encounter (Signed)
[facility] [place]  Advanced Heart Failure and Transplant  Heart Transplant Clinic  Follow-up Visit    Primary Care Physician: Brodsky, Mark E  Referring Provider: Brett Justin Berman  Date of Transplant: 06/25/2019  Organ(s) Transplanted: heart  Indication for transplant: Dilated Myopathy: Idiopathic  PHS increased risk donor: Yes    ID. 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    Interval History:    The patient was last seen on 08/20/21. At that time issues with pain after urologic procedure.    He continues to deal with pain issue largely from prostate surgery. He still has some bleeding and some tissue come out. He tried different strategies and nothing helped. This is really impacting quality of life. He gets tired and frustrated and does not want to take it out on his family.    ROS:  A complete ROS was performed and is negative except as documented in the HPI.      Allergies:  Patient is allergic to cats [other] and dogs [other].    Past Medical History:   Diagnosis Date    Asthma     Atrial fibrillation (CMS-HCC)     Chronic HFrEF (heart failure with reduced ejection fraction) (CMS-HCC)     GERD (gastroesophageal reflux disease)     HTN (hypertension)     Insomnia     Nephrolithiasis     Sinusitis      Patient Active Problem List   Diagnosis    COPD (chronic obstructive pulmonary disease) (CMS-HCC)    Heart transplant, orthotopic, 06/25/2019    Pericardial effusion    Hypertension    Chronic back pain    At risk for infection transmitted from donor    Acute hepatitis C virus infection    Heart transplanted (CMS-HCC)    Acute UTI    Umbilical hernia without obstruction and without gangrene    COVID-19 virus detected    Acute medial meniscus tear of left knee, sequela    Localized osteoarthritis of left knee     Past Surgical History:   Procedure Laterality Date    CARDIAC DEFIBRILLATOR PLACEMENT       PB ANESTH,SHOULDER JOINT,NOS Right      Family History   Problem Relation Name Age of Onset    Hypertension Other      Other Maternal Grandmother          kidney disease needing HD     Social History     Socioeconomic History    Marital status: Single     Spouse name: Not on file    Number of children: Not on file    Years of education: Not on file    Highest education level: Not on file   Occupational History    Not on file   Tobacco Use    Smoking status: Never    Smokeless tobacco: Never    Tobacco comments:     from friends and relatives    Substance and Sexual Activity    Alcohol use: Not Currently     Comment: Prior heavier use, but completely quit in 2016    Drug use: Yes     Comment: eats edible marijuana for pain and insomnia     Sexual activity: Not on file   Other Topics Concern    Not on file   Social History Narrative      Born in [place], also lived in [place], [place], [place], no travel, worked as a carpenter, occasional cedar, no birds, no hot tubs, worked in construction + possible asbestos exposure      Social Determinants of Health     Financial Resource Strain: Not on file   Food Insecurity: Not on file   Transportation Needs: Not on file   Physical Activity: Not on file   Stress: Not on file   Social Connections: Not on file   Intimate Partner Violence: Not on file   Housing Stability: Not on file     Current Outpatient Medications   Medication Sig    albuterol 108 (90 Base) MCG/ACT inhaler Inhale 2 puffs by mouth every 4 hours as needed for Wheezing or Shortness of Breath.    aspirin 81 MG EC tablet Take 1 tablet (81 mg) by mouth daily.    baclofen (LIORESAL) 10 MG tablet Take 2 tablets (20 mg) by mouth nightly.    Blood Glucose Monitoring Suppl (TRUE METRIX METER) w/Device KIT Use as directed    budesonide-formoterol (SYMBICORT) 160-4.5 MCG/ACT inhaler Inhale 2 puffs by mouth every 12 hours.    bumetanide (BUMEX) 1 MG tablet Take 1 tablet (1 mg) by mouth daily as needed (fluid/weight  gain). Do not take unless instructed by Transplant team.    Calcium Carb-Cholecalciferol 600-10 MG-MCG TABS Take 1 tablet by mouth 2 times daily.    Cetirizine HCl (ZERVIATE) 0.24 % SOLN Place 1 drop into both eyes 2 times daily.    clindamycin (CLEOCIN T) 1 % solution Apply 1 Application. topically 2 times daily. Apply to the red bumps on your face up to two times a day.    controlled substance agreement controlled substance agreement    diclofenac (VOLTAREN) 1 % gel Apply 2 g topically 4 times daily.    docusate sodium (COLACE) 100 MG capsule Take 1 capsule (100 mg) by mouth 2 times daily.    DULoxetine (CYMBALTA) 30 MG CR capsule Take 1 capsule (30 mg) by mouth daily.    famotidine (PEPCID) 20 MG tablet Take 1 tablet (20 mg) by mouth 2 times daily.    fluticasone propionate (FLONASE) 50 MCG/ACT nasal spray Spray 1 spray into each nostril 2 times daily.    gabapentin (NEURONTIN) 300 MG capsule Take 1 capsule (300 mg) by mouth every morning AND 1 capsule (300 mg) daily AND 2 capsules (600 mg) every evening.    hydroCHLOROthiazide (HYDRODIURIL) 25 MG tablet Take 1 tablet (25 mg) by mouth daily.    ketoconazole (NIZORAL) 2 % shampoo Use shampoo daily for dandruff    lidocaine (LIDOCAINE PAIN RELIEF) 4 % patch Apply 1 patch topically every 24 hours. Leave patch on for 12 hours, then remove for 12 hours.    lisinopril (PRINIVIL, ZESTRIL) 10 MG tablet Take 2 tablets (20 mg) by mouth daily.    magnesium oxide (MAG-OX) 400 MG tablet Take 1 tablet by mouth daily    melatonin (GNP MELATONIN MAXIMUM STRENGTH) 5 MG tablet Take 2 tablets (10 mg) by mouth at bedtime.    Multiple Vitamin (MULTIVITAMIN) TABS tablet Take 1 tablet by mouth daily.    naloxone (KLOXXADO) 8 mg/0.1 mL nasal spray Call 911! Tilt head and spray intranasally into one nostril as needed for respiratory depression. If patient does not respond or responds and then relapses, repeat using a new nasal spray every 3 minutes until emergency medical assistance  arrives.    NEEDLE, DISP, 25 G 25G X   1" MISC Use to inject testosterone    NIFEdipine (ADALAT CC) 30 MG Controlled-Release tablet Take 1 tablet (30 mg) by mouth nightly.    ondansetron (ZOFRAN) 8 MG tablet Take 1 tablet (8 mg) by mouth every 8 hours as needed for Nausea/Vomiting.    oxyCODONE (ROXICODONE) 10 MG tablet Take 1 tab every 4 hours as needed for moderate pain and 2 tabs every 4 hours as needed for severe pain. Max 10 tabs per day, 28 day supply    phenazopyridine (PYRIDIUM) 100 MG tablet Take 1 tablet (100 mg) by mouth 3 times daily.    polyethylene glycol (GLYCOLAX) 17 GM/SCOOP powder Mix 17 grams in 4-8 oz of liquide and drink by mouth daily as needed (Constipation).    pravastatin (PRAVACHOL) 40 MG tablet Take 1 tablet (40 mg) by mouth every evening.    senna (SENOKOT) 8.6 MG tablet Take 1 tablet (8.6 mg) by mouth daily.    sirolimus (RAPAMUNE) 1 MG tablet Take 2 tablets (2 mg) by mouth every morning.    SYRINGE-NEEDLE, DISP, 3 ML (B-D 3CC LUER-LOK SYR 25GX1") 25G X 1" 3 ML MISC Use as directed to inject testosterone    SYRINGE-NEEDLE, DISP, 3 ML 18G X 1-1/2" 3 ML MISC Use to draw up testosterone    tacrolimus (ENVARSUS XR) 1 MG tablet STOP TAKING since 01/16/22 - remaining on chart for dose adjustments, titratable med.    tacrolimus (ENVARSUS XR) 4 MG tablet Take 1 tablet (4 mg) by mouth every morning.    tamsulosin (FLOMAX) 0.4 MG capsule Take 1 capsule (0.4 mg) by mouth daily.    tamsulosin (FLOMAX) 0.4 MG capsule Take 1 capsule (0.4 mg) by mouth daily.    testosterone cypionate (DEPO-TESTOSTERONE) 200 MG/ML SOLN Inject 1 ml into the muscle every 14 days    traZODone (DESYREL) 50 MG tablet Take 1 tablet (50 mg) by mouth nightly.     Current Facility-Administered Medications   Medication    diphenhydrAMINE (BENADRYL) injection 50 mg    diphenhydrAMINE (BENADRYL) tablet 50 mg     Immunization History   Administered Date(s) Administered    COVID-19 (Moderna) Low Dose Red Cap >= 18 Years 06/16/2020     COVID-19 (Moderna) Red Cap >= 12 Years 07/24/2019, 08/23/2019, 12/22/2019    Hep-A/Hep-B; Twinrix, Adult 07/17/2020    Influenza Vaccine (High Dose) Quadrivalent >=65 Years 03/08/2020    Influenza Vaccine (Unspecified) 01/04/2017    Influenza Vaccine >=6 Months 03/19/2010, 03/19/2011, 05/14/2012, 02/09/2014, 01/23/2018, 02/08/2019    Pneumococcal 13 Vaccine (PREVNAR-13) 03/08/2020    Pneumococcal 23 Vaccine (PNEUMOVAX-23) 03/19/2013, 07/17/2020    Tdap 05/07/2011   Deferred Date(s) Deferred    Pneumococcal 23 Vaccine (PNEUMOVAX-23) 07/08/2019     Physical Exam:  BP 102/69 (BP Location: Right arm, BP Patient Position: Sitting, BP cuff size: Large)   Pulse 98   Temp 98.5 F (36.9 C) (Temporal)   Resp 16   Ht 5' 10" (1.778 m)   Wt 96.2 kg (212 lb)   SpO2 97%   BMI 30.42 kg/m      General Appearance: ***alert, no distress, pleasant affect, cooperative.  Heart:  JVD ***, PMI ***, normal rate and regular rhythm, no murmurs, clicks, or gallops. ***  Lungs: ***clear to auscultation and percussion. No rales, rhonchi, or wheezes noted. No chest deformities noted.  Abdomen: ***BS normal.  Abdomen soft, non-tender.  No masses or organomegaly.  Extremities:  ***no cyanosis, clubbing, or edema. Has 2+ peripheral pulses.        Lab Data:  Lab Results   Component Value Date    BUN 26 (H) 01/16/2022    CREAT 1.98 (H) 01/16/2022    CL 99 01/16/2022    NA 140 01/16/2022    K 4.4 01/16/2022    CA 9.2 01/16/2022    TBILI 0.47 01/16/2022    ALB 4.1 01/16/2022    TP 7.1 01/16/2022    AST 22 01/16/2022    ALK 76 01/16/2022    BICARB 29 01/16/2022    ALT 25 01/16/2022    GLU 126 (H) 01/16/2022     Lab Results   Component Value Date    WBC 7.9 01/16/2022    RBC 5.50 01/16/2022    HGB 15.2 01/16/2022    HCT 46.5 01/16/2022    MCV 84.5 01/16/2022    MCHC 32.7 01/16/2022    RDW 12.3 01/16/2022    PLT 162 01/16/2022    MPV 11.6 01/16/2022     Lab Results   Component Value Date    A1C 5.7 06/15/2021     Lab Results   Component Value Date     TSH 1.63 06/15/2021     Lab Results   Component Value Date    CHOL 105 06/15/2021    HDL 38 06/15/2021    LDLCALC 43 06/15/2021    TRIG 121 06/15/2021     Lab Results   Component Value Date    SIROT 11.5 01/16/2022     Lab Results   Component Value Date    FKTR 6.5 01/16/2022     No results found for: CSATR  Lab Results   Component Value Date    CMVPL Not Detected 04/06/2021     Lab Results   Component Value Date    DSA ABSENT 01/16/2022       Prior Cardiovascular Studies:   Lab Results   Component Value Date    LV Ejection Fraction 59 07/12/2021          Echo 07/12/21  Summary:   1. The left ventricular size is normal. The left ventricular systolic function is normal.   2. No left ventricular hypertrophy.   3. Normal pattern of left ventricular diastolic filling.   4. EF=59%.   5. Compared to prior study EF now 59%, was 69% 08/04/20.     LHC/IVUS 07/10/21  CONCLUSION:                                                                   1. 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                              2. No angiographic evidence of coronary artery disease.                      3. Intimal thickness noted in LAD/LM up to 0.5 mm (Stable to slightly       worse compare to 2022).                                                         4. Non significant FFR at apical LAD.                                        5. Left ventricular end diastolic pressure appears normal.        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    Assessment/Plan:  # Hematuria  # Dysuria  # Chronic pain  Assessment: We had a long frank discussion about patient's chronic pain issues and the heart transplant team's role in this. I discussed with him that when I initially agreed to cover his chronic opiate prescription, this was the assumption that he would  have a provider versed in chronic pain after 3-4 months, but we are at 6 months and has unable to find one. Additionally, I had not put him on a pain contract at that time, but he recently used more opiates without asking and I informed him this was not appropriate, but because he had not established guidelines I was not going to stop at this time. However, going forward until he can establish with a pain physician, we will set up a pain contract and he will need to follow through like a usual pain clinic with us with goal of provider in 3-4 months or I may start tapering. I will augment adjuvant agents additionally for now and we can continue to work on this.  Plan:  -pain contract signed  -urine tox monthly  -clinic follow up month  -oxycodone 10 mg tablets PO, 1 tab every 4 hours moderate pain, 2 tabs every 4 hours for severe pain, no more than 10 tablets a day, total 280 per 28 days.   -diclofenac cream for joint pain  -lidocaine patch for back pain  -trial of pyridium  -increase gaba at night  -siro change as below  -cymbalta as below    # End-stage heart failure s/p orthotopic heart transplant  # Chronic Immunosuppression/Immunomodulation  Assessment: While we thought continuing sirolimus would help prevent recurrent scar tissue from prostate procedure, it may be exacerbating factors now with delayed wound healing. Will try mmf for 1 month.  Plan:   - continue envarsus 6 mg daily, goal trough 4-8  - HOLD sirolimus 3 mg daily, goal trough 4-8 for at least 1 month  - start mmf 1000 mg bid for one month to allow healing  - Continue to monitor for renal toxicities, infection risk and malignancy risk  - continue pravastatin 40 mg daily  - continue aspirin 81 mg daily    # Hypertension  Assessment: controlled  Plan:  -continue lisinopril 20 mg daily  -resume hctz  -nifedipine 30 mg daily    # Dyslipidemia  -continue pravastatin 40 mg daily    # Depression  Assessment: improved mood  Plan:  -increase cymbalta to 120  mg daily     RTC in 1 month       Nicholas W Wettersten, MD  Advanced Heart Failure, Mechanical Circulatory Support, Transplant  Pgr: [phone]

## 2022-07-17 NOTE — Progress Notes (Signed)
[name] 67 y.o. 1956/04/28 [id number]  Assessment & Plan:   Encounter Diagnoses  Name Primary?   Dyspepsia Yes   Abdominal pain, epigastric    History of Nissen fundoplication    I am going to try Carafate before every meal and nightly.  We discussed the possibility of trying neuromodulator such as amitriptyline.  She does not seem to have diabetic neuropathy problems at least not peripheral or eye problems and her diabetes is under decent control it seems.  However that does not exclude autonomic neuropathy and she had a fundoplication in the past which could be playing a role but she has fairly constant burning pain which sounds like a dyspepsia.  She has had this off and on in the past and has other functional disturbances notably IBS.  Also has had problems that I thought were gastroparesis in the past but no that was when she was significantly hyperglycemic and does not seem to be in play now.  However that amplifies the possibility of neuropathic disturbance.  She has been on duloxetine in the past I think and there was some question of extremely delayed REM onset noted related to Cymbalta or Celexa on a sleep study in 2022 but I do not think she was on it then.  There is no history of amitriptyline or nortriptyline either.  Cognitive behavioral therapy was recommended in this case.  She may be a good candidate for a tricyclic particularly the amitriptyline with her terrible sleep disturbance perhaps.  This might also help abdominal pain we will see what the Carafate does first.  She will return in 6 to 8 weeks for reassessment and we can determine the next steps.  CC: [name], MD  Subjective:   Chief Complaint: Epigastric pain, constant burning  HPI [name] is a 67 year old African-American woman with IBS-mixed, mostly constipation however, prior problems with gastric emptying question gastroparesis, and chronic recurrent epigastric pain who is here complaining of burning  epigastric pain again.  She had seen Tyeasha Ebbs Best in October 2023 but that they there were neurologic changes and we were concerned she might of had a stroke and she was sent to the hospital.  There were acute visual changes, workup negative for stroke.  She has still had a fairly constant epigastric burning, it feels like food sits in her stomach at times.  She is not vomiting but she gets nauseous some.  Pepto-Bismol has provided some relief.  She has switched to decaf coffee and mostly drinks water.  It will burn if she has to drink a Coke to bring her sugar up.  Not describing dysphagia or overt reflux.  Doxycycline is taken at times when she gets boils but not lately.  She has a new job working Sales executive at the [employer]  She is falling asleep at the end of the day and does need to take Ambien often at night to help her sleep.  Weight is stable.  She saw endocrinology 05/27/2022, A1c was 7.4% down from 8.2% 8 months prior, and no diagnosis of peripheral neuropathy or retinopathy noted. Wt Readings from Last 3 Encounters:  07/17/22 135 lb 6.4 oz (61.4 kg)  05/27/22 136 lb 12.8 oz (62.1 kg)  03/12/22 132 lb 6.4 oz (60.1 kg)   Last EGD 2021 with slightly loose Nissen fundoplication, Colonoscopy 2021 3 diminutive adenomatous polyps diverticulosis and hemorrhoids Allergies  Allergen Reactions   Sulfa Antibiotics Shortness Of Breath and  Palpitations   Codeine Other (See Comments)    Recovering Addict does not like to take Narcotics   Fish Allergy Hives and Swelling    Tongue swelling   Iodine Swelling   Metformin And Related Diarrhea   [name] [Insulin Glargine-Lixisenatide] Itching and Other (See Comments)    "tongue swelling"   Adhesive [Tape] Rash    Paper tape is ok   Shellfish Allergy Swelling and Rash    Tongue swelling   Current Meds  Medication Sig   acetaminophen (TYLENOL) 325 MG tablet Take 2 tablets (650  mg total) by mouth every 6 (six) hours as needed for mild pain (or Fever >/= 101).   albuterol (PROAIR HFA) 108 (90 Base) MCG/ACT inhaler 1-2 inhalations every 4-6 hours as needed for cough or wheeze. (Patient taking differently: Inhale 1-2 puffs into the lungs every 4 (four) hours as needed for shortness of breath.)   ALPRAZolam (XANAX) 0.5 MG tablet Take 0.5 mg by mouth 2 (two) times daily as needed for anxiety.   aspirin EC 81 MG tablet Take 1 tablet by mouth daily.   Azelastine HCl 0.15 % SOLN Place 2 sprays into both nostrils 2 (two) times daily. (Patient taking differently: Place 2 sprays into both nostrils 2 (two) times daily as needed (allergies).)   bisacodyl (DULCOLAX) 5 MG EC tablet Take 5 mg by mouth daily as needed for moderate constipation.   clopidogrel (PLAVIX) 75 MG tablet Take 1 tablet (75 mg total) by mouth daily.   Continuous Blood Gluc Receiver (Puryear) DEVI by Does not apply route.   cyclobenzaprine (FLEXERIL) 10 MG tablet Take 1 tablet by mouth as needed.   doxycycline (VIBRAMYCIN) 100 MG capsule Take 1 capsule (100 mg total) by mouth 2 (two) times daily. (Patient taking differently: Take 100 mg by mouth as needed.)   empagliflozin (JARDIANCE) 25 MG TABS tablet Take 1 tablet (25 mg total) by mouth daily before breakfast.   EPINEPHrine 0.3 mg/0.3 mL IJ SOAJ injection Inject 0.3 mLs (0.3 mg total) into the muscle once.   fluconazole (DIFLUCAN) 150 MG tablet Take 1 tablet by mouth as needed.   Fluocinolone Acetonide Body 0.01 % OIL Apply topically 2 (two) times daily.   fluticasone (FLONASE) 50 MCG/ACT nasal spray SPRAY 2 SPRAYS INTO EACH NOSTRIL EVERY DAY   Insulin Disposable Pump (OMNIPOD 5 G6 POD, GEN 5,) MISC 1 Device by Other route every 3 (three) days. Change every 72 hours   insulin lispro (HUMALOG) 100 UNIT/ML injection Max daily 60 units   levocetirizine (XYZAL) 5 MG tablet Take 5 mg by mouth daily.   loperamide (IMODIUM) 2 MG capsule Take 2 mg by mouth  daily.   loratadine (CLARITIN) 10 MG tablet Take 1 tablet by mouth daily.   losartan-hydrochlorothiazide (HYZAAR) 50-12.5 MG tablet Take 1 tablet by mouth daily.   lubiprostone (AMITIZA) 24 MCG capsule Take 1 capsule (24 mcg total) by mouth 2 (two) times daily with a meal.   Magnesium 200 MG TABS Take 1 tablet (200 mg total) by mouth daily.   montelukast (SINGULAIR) 10 MG tablet Take 10 mg by mouth daily.   mupirocin ointment (BACTROBAN) 2 % Apply 1 application topically 2 (two) times daily. (Patient taking differently: Apply 1 application  topically 2 (two) times daily as needed.)   nitroGLYCERIN (NITROSTAT) 0.4 MG SL tablet PLACE 1 TAB UNDER TONGUE EVERY 5 MINS AS NEEDED FOR CHEST PAIN - MAX 3 DOSES THEN 911   pantoprazole (PROTONIX) 40 MG tablet TAKE  1 TABLET BY MOUTH EVERY DAY (Patient taking differently: Take 40 mg by mouth daily.)   polyethylene glycol (MIRALAX / GLYCOLAX) 17 g packet Take 17 g by mouth daily as needed for moderate constipation or severe constipation.   potassium chloride (KLOR-CON) 10 MEQ tablet Take 10 mEq by mouth daily.   propranolol ER (INDERAL LA) 60 MG 24 hr capsule Take 1 capsule (60 mg total) by mouth every evening.   rosuvastatin (CRESTOR) 40 MG tablet Take 1 tablet (40 mg total) by mouth daily.       SUMAtriptan (IMITREX) 100 MG tablet Take 100 mg by mouth every 2 (two) hours as needed for migraine.    temazepam (RESTORIL) 30 MG capsule Take 30 mg by mouth at bedtime as needed for sleep.   traMADol (ULTRAM) 50 MG tablet Take 50 mg by mouth 3 (three) times daily as needed.   Past Medical History:  Diagnosis Date   Allergic urticaria 07/10/2015   Allergy    Anemia    hx   Angioedema 07/10/2015   Anxiety    Arthritis    "neck, left hand" (09/14/2012)   Asthma    Cancer ([place]) 1985   ovarian, no treatment except surgery   Cataract    Complication of anesthesia    Bell TO RIGHT   Critical lower limb ischemia ([place])    10/2014 s/p L  SFA stenting   Diverticulosis of colon with hemorrhage April 2013   GERD (gastroesophageal reflux disease)    GI bleed    H/O hiatal hernia    Heart attack ([place])    2003 mild MI, March 2013 mild MI   Hidradenitis    groin   History of blood transfusion 1985 AND 2013   Hyperlipidemia    Hypertension    Irritable bowel syndrome    Left-sided weakness    since stroke, left eye trouble seeing   Migraines    Mild CAD    a. Cath 09/2010: mild luminal irregularities of LAD, 30% prox RCA and 20-30% mRCA, EF 65%.   Neuropathy    Obesity    Osteoporosis    PAD (peripheral artery disease) ([place])    a. critical limb ischemia s/p PTA/stenting of L SFA 10/2014. c. occ prior SFA stent by angio 01/2016, for possible PV bypass.   Pneumonia    baby   Recurrent upper respiratory infection (URI)    S/P arterial stent-mid Lt SFA 11/03/14 11/04/2014   Schatzki's ring    Sinus problem    Stomach ulcer 1972   non-bleeding   Stroke ([place]) 07-2007, 07-2008, 07-2009   total 3 strokes; mild left sided weakness and left eye "jumps".   Type II diabetes mellitus (HCC)    Vitamin B 12 deficiency 07-15-2013   Past Surgical History:  Procedure Laterality Date   ABDOMINAL HYSTERECTOMY  1985   ADENOIDECTOMY     ANTERIOR CERVICAL DECOMP/DISCECTOMY FUSION  2002   ANTERIOR CERVICAL DECOMP/DISCECTOMY FUSION N/A 04/08/2014   Procedure: Cervical Six-Seven ANTERIOR CERVICAL DECOMPRESSION/DISCECTOMY FUSION Plating and Bonegraft  2 LEVELS;  Surgeon: Ashok Pall, MD;  Location: [facility];  Service: Neurosurgery;  Laterality: N/A;  Cervical Six-Seven ANTERIOR CERVICAL DECOMPRESSION/DISCECTOMY FUSION Plating and Bonegraft  2 LEVELS   APPENDECTOMY  1985   AXILLARY HIDRADENITIS EXCISION  1990-2008   bilateral   BACK SURGERY     BREAST BIOPSY Right 2007   BREAST CYST EXCISION Right 2008   BREAST REDUCTION SURGERY     CARDIAC  CATHETERIZATION  2004   mild disease   CATARACT EXTRACTION W/PHACO Left 05/16/2015   Procedure:  CATARACT EXTRACTION PHACO AND INTRAOCULAR LENS PLACEMENT (IOC);  Surgeon: Rutherford Guys, MD;  Location: [facility];  Service: Ophthalmology;  Laterality: Left;  CDE: 4.24   CATARACT EXTRACTION W/PHACO Right 05/30/2015   Procedure: CATARACT EXTRACTION RIGHT EYE PHACO AND INTRAOCULAR LENS PLACEMENT ;  Surgeon: Rutherford Guys, MD;  Location: [facility];  Service: Ophthalmology;  Laterality: Right;  CDE:4.08   CHOLECYSTECTOMY  1990's   COLONOSCOPY  08/12/2011   Procedure: COLONOSCOPY;  Surgeon: Ladene Artist, MD,FACG;  Location: [facility] ENDOSCOPY;  Service: Endoscopy;  Laterality: N/A;   COLONOSCOPY  06/19/2006   COLONOSCOPY WITH PROPOFOL N/A 07/02/2019   Procedure: COLONOSCOPY WITH PROPOFOL;  Surgeon: Rogene Houston, MD;  [facility];  Service: Endoscopy;  Laterality: N/A;   cyst thigh Right    ESOPHAGOGASTRODUODENOSCOPY  08/12/2011   Procedure: ESOPHAGOGASTRODUODENOSCOPY (EGD);  Surgeon: Ladene Artist, MD,FACG;  Location: [facility]COPY;  Service: Endoscopy;  Laterality: N/A;   ESOPHAGOGASTRODUODENOSCOPY  06/04/2005   ESOPHAGOGASTRODUODENOSCOPY (EGD) WITH PROPOFOL N/A 07/01/2019   Procedure: ESOPHAGOGASTRODUODENOSCOPY (EGD) WITH PROPOFOL;  Surgeon: Rogene Houston, MD;  [facility];  Service: Endoscopy;  Laterality: N/A;   FEMORAL-POPLITEAL BYPASS GRAFT Left 06/19/2016   Procedure: Left Leg BYPASS GRAFT FEMORAL-POPLITEAL ARTERY;  Surgeon: Serafina Mitchell, MD;  Location: [facility];  Service: Vascular;  Laterality: Left;   GIVENS CAPSULE STUDY  08/13/2011   Procedure: GIVENS CAPSULE STUDY;  Surgeon: Ladene Artist, MD,FACG;  Location: [facility] ENDOSCOPY;  Service: Endoscopy;  Laterality: N/A;   HAMMER TOE SURGERY Bilateral ~ 2000   HEMOSTASIS CLIP PLACEMENT  07/02/2019   Procedure: HEMOSTASIS CLIP PLACEMENT;  Surgeon: Rogene Houston, MD;  [facility];  Service: Endoscopy;;  hepatic flexure   HIATAL HERNIA REPAIR     HYDRADENITIS EXCISION  01/2011; 03/2012   'groin and abdomen; 03/2012"  (09/14/2012)   HYDRADENITIS EXCISION  04/01/2012   Procedure: EXCISION HYDRADENITIS GROIN;  Surgeon: Pedro Earls, MD;  Location: [facility];  Service: General;  Laterality: Bilateral;  Excision of Hydradenitis of Perineum   HYDRADENITIS EXCISION N/A 09/17/2013   Procedure: EXCISION PERINEAL HIDRADENITIS ;  Surgeon: Pedro Earls, MD;  Location: [facility];  Service: General;  Laterality: N/A;  also in the pubis area   LEFT HEART CATH AND CORONARY ANGIOGRAPHY N/A 12/26/2016   Procedure: LEFT HEART CATH AND CORONARY ANGIOGRAPHY;  Surgeon: [name], Peter M, MD;  Location: [facility] CV LAB;  Service: Cardiovascular;  Laterality: N/A;   MASS EXCISION Right 09/17/2013   Procedure: EXCISION MASS;  Surgeon: Pedro Earls, MD;  Location: [facility];  Service: General;  Laterality: Right;   NISSEN FUNDOPLICATION  [id number]   PERIPHERAL VASCULAR CATHETERIZATION N/A 11/03/2014   Procedure: Lower Extremity Angiography;  Surgeon: Lorretta Harp, MD;  Location: [facility] LAB;  Service: Cardiovascular;  Laterality: N/A;   PERIPHERAL VASCULAR CATHETERIZATION N/A 01/29/2016   Procedure: Lower Extremity Angiography;  Surgeon: Lorretta Harp, MD;  Location: [facility];  Service: Cardiovascular;  Laterality: N/A;   POLYPECTOMY  07/02/2019   Procedure: POLYPECTOMY;  Surgeon: Rogene Houston, MD;  [facility];  Service: Endoscopy;;   POSTERIOR LUMBAR FUSION  2008 X 2   REDUCTION MAMMAPLASTY  1996?   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   Review of Systems As per HPI  Objective:   Physical Exam BP (!) 144/88   Pulse 72   Ht 5' 3.5" (1.613 m)   Wt 135 lb 6.4 oz (61.4 kg)   BMI 23.61 kg/m  Well-developed well-nourished black woman no acute distress Chest wall nontender Epigastrium mildly tender soft no rebound and negative Carnett's sign abdomen otherwise benign and negative exam

## 2022-07-18 ENCOUNTER — Ambulatory Visit: Payer: Self-pay | Admitting: *Deleted

## 2022-07-18 NOTE — Patient Outreach (Signed)
  Care Coordination   07/18/2022 Name: [name] MRN: [id number] DOB: 1955-10-22   Care Coordination Outreach Attempts:  An unsuccessful telephone outreach was attempted today to offer the patient information about available care coordination services as a benefit of their health plan.   Follow Up Plan:  Additional outreach attempts will be made to offer the patient care coordination information and services.   Encounter Outcome:  No Answer   Care Coordination Interventions:  No, not indicated    Cheetara Hoge L. Lavina Hamman, RN, BSN, [place] Coordinator Office number [phone]

## 2022-07-19 ENCOUNTER — Encounter: Payer: 59 | Admitting: *Deleted

## 2022-07-19 ENCOUNTER — Ambulatory Visit: Payer: Self-pay | Admitting: *Deleted

## 2022-07-19 NOTE — Patient Outreach (Signed)
  Care Coordination   07/19/2022 Name: [name] MRN: [id number] DOB: [birth year]   Care Coordination Outreach Attempts:  A second unsuccessful outreach was attempted today to offer the patient with information about available care coordination services as a benefit of their health plan.     Follow Up Plan:  Additional outreach attempts will be made to offer the patient care coordination information and services.   Encounter Outcome:  No Answer   Care Coordination Interventions:  No, not indicated    Alea Ryer L. Lavina Hamman, RN, BSN, [place] Coordinator Office number [phone]

## 2022-07-22 ENCOUNTER — Ambulatory Visit: Payer: Self-pay | Admitting: *Deleted

## 2022-07-22 NOTE — Patient Outreach (Addendum)
  Care Coordination   Follow Up Visit Note   07/22/2022 Name: [name] MRN: [id number] DOB: [birth year]  [name] is a 67 y.o. year old female who sees [place] Crane, [name], MD for primary care. I spoke with  [name] by phone today.  What matters to the patients health and wellness today?  Patient returned a call to RN CM during her lunch break. Gastroenterology visit completed started on Carafate vs surgical options    Goals Addressed             This Visit's Progress    manage IBS, Diverticulosis       Interventions Today    Flowsheet Row Most Recent Value  Chronic Disease   Chronic disease during today's visit Other  [IBS, Diverticulosis, establish care]  General Interventions   General Interventions Discussed/Reviewed General Interventions Reviewed, Doctor Visits  [reviewed her gastroenterologist appointment]  Doctor Visits Discussed/Reviewed Specialist, Doctor Visits Reviewed  [Patient to complete trial of new GI medicines and follow up with Dr Carlean Purl for other options if new medicines not beneficial. Confirmed she is doing well]  PCP/Specialist Visits Compliance with follow-up visit  Pharmacy Interventions   Pharmacy Dicussed/Reviewed Medications and their functions, Pharmacy Topics Reviewed  [started on Carafate by Dr  Carlean Purl              SDOH assessments and interventions completed:  No     Care Coordination Interventions:  Yes, provided   Follow up plan: Follow up call scheduled for 08/23/22    Encounter Outcome:  Pt. Visit Completed   Anyia Gierke L. Lavina Hamman, RN, BSN, [place] Coordinator Office number [phone]

## 2022-07-22 NOTE — Patient Instructions (Signed)
Visit Information  Thank you for taking time to visit with me today. Please don't hesitate to contact me if I can be of assistance to you.   Following are the goals we discussed today:   Goals Addressed             This Visit's Progress    manage IBS, Diverticulosis       Interventions Today    Flowsheet Row Most Recent Value  Chronic Disease   Chronic disease during today's visit Other  [IBS, Diverticulosis, establish care]  General Interventions   General Interventions Discussed/Reviewed General Interventions Reviewed, Doctor Visits  [reviewed her gastroenterologist appointment]  Doctor Visits Discussed/Reviewed Specialist, Doctor Visits Reviewed  [Patient to complete trial of new GI medicines and follow up with Dr Carlean Purl for other options if new medicines not beneficial. Confirmed she is doing well]  PCP/Specialist Visits Compliance with follow-up visit  Pharmacy Interventions   Pharmacy Dicussed/Reviewed Medications and their functions, Pharmacy Topics Reviewed  [started on Carafate by Dr  Carlean Purl              Our next appointment is by telephone on 08/23/22 at 1130  Please call the care guide team at [phone] if you need to cancel or reschedule your appointment.   If you are experiencing a Mental Health or [place] or need someone to talk to, please call the Suicide and Crisis Lifeline: 988 call the [place] National Suicide Prevention Lifeline: (684)214-3637 or TTY: 4803123554 TTY 908-755-3517) to talk to a trained counselor call 1-800-273-TALK (toll free, 24 hour hotline) go to [facility] [address] [place] [phone]) call 911   Patient verbalizes understanding of instructions and care plan provided today and agrees to view in [place]. Active MyChart status and patient understanding of how to access instructions and care plan via MyChart confirmed with patient.     The patient has been provided  with contact information for the care management team and has been advised to call with any health related questions or concerns.    Dash Cardarelli L. Lavina Hamman, RN, BSN, [place] Coordinator Office number [phone]

## 2022-07-23 DIAGNOSIS — I10 Essential (primary) hypertension: Secondary | ICD-10-CM | POA: Diagnosis not present

## 2022-07-23 DIAGNOSIS — Z6822 Body mass index (BMI) 22.0-22.9, adult: Secondary | ICD-10-CM | POA: Diagnosis not present

## 2022-07-23 DIAGNOSIS — E109 Type 1 diabetes mellitus without complications: Secondary | ICD-10-CM | POA: Diagnosis not present

## 2022-07-23 DIAGNOSIS — F17210 Nicotine dependence, cigarettes, uncomplicated: Secondary | ICD-10-CM | POA: Diagnosis not present

## 2022-07-23 DIAGNOSIS — Z87891 Personal history of nicotine dependence: Secondary | ICD-10-CM | POA: Diagnosis not present

## 2022-07-23 DIAGNOSIS — Z0000 Encounter for general adult medical examination without abnormal findings: Secondary | ICD-10-CM | POA: Diagnosis not present

## 2022-07-29 ENCOUNTER — Encounter: Payer: 59 | Admitting: *Deleted

## 2022-07-29 ENCOUNTER — Telehealth: Payer: Self-pay

## 2022-07-29 NOTE — Telephone Encounter (Signed)
Spoke with patient and advised she contact omnipod to have them help her adjust the basal rate.

## 2022-07-29 NOTE — Telephone Encounter (Signed)
Spoke with patient and advised her to change her basal rate to 1.8 starting at 8am tomorrow.  Patient is unsure how to adjust her pump.  She has the omnipod 5.

## 2022-07-29 NOTE — Telephone Encounter (Signed)
Patient called concerned of low glucose of 40.  She would like to know how to proceed.

## 2022-07-30 ENCOUNTER — Telehealth: Payer: Self-pay | Admitting: Internal Medicine

## 2022-07-30 DIAGNOSIS — R1013 Epigastric pain: Secondary | ICD-10-CM

## 2022-07-30 NOTE — Telephone Encounter (Unsigned)
Left message for pt to call back  °

## 2022-07-30 NOTE — Telephone Encounter (Signed)
PT recently prescribed carafate and says that it is not working. She was offered other alternatives and wants to pursue them. Please advise

## 2022-07-30 NOTE — Telephone Encounter (Signed)
Patient is returning [name]'s call 

## 2022-07-31 DIAGNOSIS — E109 Type 1 diabetes mellitus without complications: Secondary | ICD-10-CM | POA: Diagnosis not present

## 2022-07-31 DIAGNOSIS — Z79899 Other long term (current) drug therapy: Secondary | ICD-10-CM | POA: Diagnosis not present

## 2022-07-31 DIAGNOSIS — E559 Vitamin D deficiency, unspecified: Secondary | ICD-10-CM | POA: Diagnosis not present

## 2022-07-31 DIAGNOSIS — Z1159 Encounter for screening for other viral diseases: Secondary | ICD-10-CM | POA: Diagnosis not present

## 2022-07-31 DIAGNOSIS — R5383 Other fatigue: Secondary | ICD-10-CM | POA: Diagnosis not present

## 2022-07-31 DIAGNOSIS — F17210 Nicotine dependence, cigarettes, uncomplicated: Secondary | ICD-10-CM | POA: Diagnosis not present

## 2022-07-31 DIAGNOSIS — R0602 Shortness of breath: Secondary | ICD-10-CM | POA: Diagnosis not present

## 2022-07-31 DIAGNOSIS — Z87891 Personal history of nicotine dependence: Secondary | ICD-10-CM | POA: Diagnosis not present

## 2022-07-31 DIAGNOSIS — I10 Essential (primary) hypertension: Secondary | ICD-10-CM | POA: Diagnosis not present

## 2022-07-31 MED ORDER — AMITRIPTYLINE HCL 25 MG PO TABS: 25.0000 mg | ORAL_TABLET | Freq: Every day | ORAL | 3 refills | Status: DC

## 2022-07-31 NOTE — Telephone Encounter (Signed)
Pt stated that she had an office visit on 07/17/2022 and started taking the Carafate on 3/14 2024. Pt stated that she is still waking up with abdominal pain and reflux. Pt stated that she does not feel that the Carafate is helping at all. Pt stated that other alternatives were discussed at office visit and pt was wondering if she could try those.  Please advise.

## 2022-07-31 NOTE — Telephone Encounter (Signed)
Left message for pt to call back  °

## 2022-07-31 NOTE — Telephone Encounter (Signed)
Meds ordered this encounter  Medications   amitriptyline (ELAVIL) 25 MG tablet    Sig: Take 1 tablet (25 mg total) by mouth at bedtime.    Dispense:  30 tablet    Refill:  3    Medications Discontinued During This Encounter  Medication Reason   sucralfate (CARAFATE) 1 g tablet     Change in Tx as above - I think she has functional/neuropathic problems  Reviewed potential side effects  Has f/u 09/07/22

## 2022-08-01 NOTE — Telephone Encounter (Signed)
Noted  

## 2022-08-04 DIAGNOSIS — E1165 Type 2 diabetes mellitus with hyperglycemia: Secondary | ICD-10-CM | POA: Diagnosis not present

## 2022-08-04 DIAGNOSIS — E1149 Type 2 diabetes mellitus with other diabetic neurological complication: Secondary | ICD-10-CM | POA: Diagnosis not present

## 2022-08-06 ENCOUNTER — Ambulatory Visit (INDEPENDENT_AMBULATORY_CARE_PROVIDER_SITE_OTHER): Payer: 59 | Admitting: Internal Medicine

## 2022-08-06 ENCOUNTER — Encounter: Payer: Self-pay | Admitting: Internal Medicine

## 2022-08-06 VITALS — BP 128/80 | HR 92 | Ht 63.5 in | Wt 134.0 lb

## 2022-08-06 DIAGNOSIS — E1159 Type 2 diabetes mellitus with other circulatory complications: Secondary | ICD-10-CM | POA: Diagnosis not present

## 2022-08-06 DIAGNOSIS — E1129 Type 2 diabetes mellitus with other diabetic kidney complication: Secondary | ICD-10-CM

## 2022-08-06 DIAGNOSIS — Z794 Long term (current) use of insulin: Secondary | ICD-10-CM | POA: Diagnosis not present

## 2022-08-06 DIAGNOSIS — E1165 Type 2 diabetes mellitus with hyperglycemia: Secondary | ICD-10-CM | POA: Diagnosis not present

## 2022-08-06 DIAGNOSIS — E1142 Type 2 diabetes mellitus with diabetic polyneuropathy: Secondary | ICD-10-CM

## 2022-08-06 DIAGNOSIS — R809 Proteinuria, unspecified: Secondary | ICD-10-CM | POA: Diagnosis not present

## 2022-08-06 NOTE — Progress Notes (Signed)
Name: [name]  Age/ Sex: 67 y.o., female   MRN/ DOB: [id number], 10/27/1955     PCP: Kristie Cowman, MD   Reason for Endocrinology Evaluation: Type 2 Diabetes Mellitus  Initial Endocrine Consultative Visit: 04/17/2020    PATIENT IDENTIFIER: [name] is a 67 y.o. female with a past medical history of T2DM, PAD ,CAD, HTN and Hx of pancreatitis  . The patient has followed with Endocrinology clinic since 04/17/2020 for consultative assistance with management of her diabetes.  DIABETIC HISTORY:  Ms. Peterman was diagnosed with DM in 2003, She is intolerant to Metformin due to diarrhea and Soliqua due to itching . Her hemoglobin A1c has ranged from 7.1%  in 20, peaking at 9.5% in 2015  Has chronic GI issues - Dr Carlean Purl   Islet Ab's and GAD-65 negative   Jardiance started 07/2020 Omnipod started 08/2020   SUBJECTIVE:   During the last visit (05/27/2022) :  A1c 7.4%   Today (08/06/2022): Ms. [name] is here for diabetes follow up .  She checks her blood sugars multiple times a day through CGM. The patient has had hypoglycemic episodes since the last clinic visit. She is symptomatic   She sees GI for dyspepsia, unable to tolerate full dose of amitriptyline, she has been taking half a tablet and now considering going to 1/4 tablet Denies constipation or diarrhea   This patient with type 2 diabetes is treated with Omnipod (insulin pump). During the visit the pump basal and bolus doses were reviewed including carb/insulin rations and supplemental doses. The clinical list was updated. The glucose meter download was reviewed in detail to determine if the current pump settings are providing the best glycemic control without excessive hypoglycemia.     Pump and meter download:      Pump   Omnipod Settings   Insulin type   Humalog    Basal rate       0000  1.40 u/h    0400 1.8   0800 2.0          I:C ratio       0000 1:6                  Sensitivity        0000  15      Goal       0000  120          Type & Model of Pump: Omnipod Insulin Type: Currently using Humalog .     PUMP STATISTICS: Average BG: 160 Average Daily Carbs (g):57.2 Average Total Daily Insulin:43 Average Daily Basal: 24.8(58 %) Average Daily Bolus: 18.2(42 %)   CONTINUOUS GLUCOSE MONITORING RECORD INTERPRETATION    Dates of Recording: 3/20-08/06/2022  Sensor description:dexcom  Results statistics:   CGM use % of time 92.1  Average and SD 160/45  Time in range  76 %  % Time Above 180 20  % Time above 250 2  % Time Below target 1   Glycemic patterns summary: BG's trend down overnight and again in the evening, fluctuating BG's during the day  Hyperglycemic episodes  postprandial   Hypoglycemic episodes occurred around 3 am and 6 am and 9 pm  Overnight periods: trends down    HOME DIABETES REGIMEN:  Humalog Jardiance 25 mg daily     Statin: yes ACE-I/ARB: yes      DIABETIC COMPLICATIONS: Microvascular complications:  Neuropathy  Denies: CKD, retinopathy Last Eye Exam: Completed 12/27/2021  Macrovascular complications:  PVD ( S/P femoral-popliteal bypass), Mild CAD  Denies: CAD, CVA   HISTORY:  Past Medical History:  Past Medical History:  Diagnosis Date   Allergic urticaria 07/10/2015   Allergy    Anemia    hx   Angioedema 07/10/2015   Anxiety    Arthritis    "neck, left hand" (09/14/2012)   Asthma    Cancer 1985   ovarian, no treatment except surgery   Cataract    Complication of anesthesia    OCCASIONAL TROUBLE TURNING NECK TO RIGHT   Critical lower limb ischemia    10/2014 s/p L SFA stenting   Diverticulosis of colon with hemorrhage April 2013   GERD (gastroesophageal reflux disease)    GI bleed    H/O hiatal hernia    Heart attack    2003 mild MI, March 2013 mild MI   Hidradenitis    groin   History of blood transfusion 1985 AND 2013   Hyperlipidemia    Hypertension    Irritable bowel syndrome    Left-sided  weakness    since stroke, left eye trouble seeing   Migraines    Mild CAD    a. Cath 09/2010: mild luminal irregularities of LAD, 30% prox RCA and 20-30% mRCA, EF 65%.   Neuropathy    Obesity    Osteoporosis    PAD (peripheral artery disease)    a. critical limb ischemia s/p PTA/stenting of L SFA 10/2014. c. occ prior SFA stent by angio 01/2016, for possible PV bypass.   Pneumonia    baby   Recurrent upper respiratory infection (URI)    S/P arterial stent-mid Lt SFA 11/03/14 11/04/2014   Schatzki's ring    Sinus problem    Stomach ulcer 1972   non-bleeding   Stroke 07-2007, 07-2008, 07-2009   total 3 strokes; mild left sided weakness and left eye "jumps".   Type II diabetes mellitus    Vitamin B 12 deficiency 07-15-2013   Past Surgical History:  Past Surgical History:  Procedure Laterality Date   ABDOMINAL HYSTERECTOMY  1985   ADENOIDECTOMY     ANTERIOR CERVICAL DECOMP/DISCECTOMY FUSION  2002   ANTERIOR CERVICAL DECOMP/DISCECTOMY FUSION N/A 04/08/2014   Procedure: Cervical Six-Seven ANTERIOR CERVICAL DECOMPRESSION/DISCECTOMY FUSION Plating and Bonegraft  2 LEVELS;  Surgeon: Ashok Pall, MD;  Location: [facility];  Service: Neurosurgery;  Laterality: N/A;  Cervical Six-Seven ANTERIOR CERVICAL DECOMPRESSION/DISCECTOMY FUSION Plating and Bonegraft  2 LEVELS   APPENDECTOMY  1985   AXILLARY HIDRADENITIS EXCISION  1990-2008   bilateral   BACK SURGERY     BREAST BIOPSY Right 2007   BREAST CYST EXCISION Right 2008   BREAST REDUCTION SURGERY     CARDIAC CATHETERIZATION  2004   mild disease   CATARACT EXTRACTION W/PHACO Left 05/16/2015   Procedure: CATARACT EXTRACTION PHACO AND INTRAOCULAR LENS PLACEMENT (IOC);  Surgeon: Rutherford Guys, MD;  Location: [facility];  Service: Ophthalmology;  Laterality: Left;  CDE: 4.24   CATARACT EXTRACTION W/PHACO Right 05/30/2015   Procedure: CATARACT EXTRACTION RIGHT EYE PHACO AND INTRAOCULAR LENS PLACEMENT ;  Surgeon: Rutherford Guys, MD;  Location: [facility];   Service: Ophthalmology;  Laterality: Right;  CDE:4.08   CHOLECYSTECTOMY  1990's   COLONOSCOPY  08/12/2011   Procedure: COLONOSCOPY;  Surgeon: Ladene Artist, MD,FACG;  Location[facility];  Service: Endoscopy;  Laterality: N/A;   COLONOSCOPY  06/19/2006   COLONOSCOPY WITH PROPOFOL N/A 07/02/2019   Procedure: COLONOSCOPY WITH PROPOFOL;  Surgeon: Rogene Houston, MD;  [facility];  Service: Endoscopy;  Laterality: N/A;   cyst thigh Right    ESOPHAGOGASTRODUODENOSCOPY  08/12/2011   Procedure: ESOPHAGOGASTRODUODENOSCOPY (EGD);  Surgeon: Ladene Artist, MD,FACG;  Location: [facility] END[facility];  Service: Endoscopy;  Laterality: N/A;   ESOPHAGOGASTRODUODENOSCOPY  06/04/2005   ESOPHAGOGASTRODUODENOSCOPY (EGD) WITH PROPOFOL N/A 07/01/2019   Procedure: ESOPHAGOGASTRODUODENOSCOPY (EGD) WITH PROPOFOL;  Surgeon: Rogene Houston, MD;  [facility];  Service: Endoscopy;  Laterality: N/A;   FEMORAL-POPLITEAL BYPASS GRAFT Left 06/19/2016   Procedure: Left Leg BYPASS GRAFT FEMORAL-POPLITEAL ARTERY;  Surgeon: Serafina Mitchell, MD;  Location: [place];  Service: Vascular;  Laterality: Left;   GIVENS CAPSULE STUDY  08/13/2011   Procedure: GIVENS CAPSULE STUDY;  Surgeon: Ladene Artist, MD,FACG;  Location[facility] END[facility];  Service: Endoscopy;  Laterality: N/A;   HAMMER TOE SURGERY Bilateral ~ 2000   HEMOSTASIS CLIP PLACEMENT  07/02/2019   Procedure: HEMOSTASIS CLIP PLACEMENT;  Surgeon: Rogene Houston, MD;  [facility];  Service: Endoscopy;;  hepatic flexure   HIATAL HERNIA REPAIR     HYDRADENITIS EXCISION  01/2011; 03/2012   'groin and abdomen; 03/2012" (09/14/2012)   HYDRADENITIS EXCISION  04/01/2012   Procedure: EXCISION HYDRADENITIS GROIN;  Surgeon: Pedro Earls, MD;  Location: [facility];  Service: General;  Laterality: Bilateral;  Excision of Hydradenitis of Perineum   HYDRADENITIS EXCISION N/A 09/17/2013   Procedure: EXCISION PERINEAL HIDRADENITIS ;  Surgeon: Pedro Earls, MD;   Location: [facility];  Service: General;  Laterality: N/A;  also in the pubis area   LEFT HEART CATH AND CORONARY ANGIOGRAPHY N/A 12/26/2016   Procedure: LEFT HEART CATH AND CORONARY ANGIOGRAPHY;  Surgeon: [name], Peter M, MD;  Location: [facility];  Service: Cardiovascular;  Laterality: N/A;   MASS EXCISION Right 09/17/2013   Procedure: EXCISION MASS;  Surgeon: Pedro Earls, MD;  Location: [facility];  Service: General;  Laterality: Right;   NISSEN FUNDOPLICATION  [id number]   PERIPHERAL VASCULAR CATHETERIZATION N/A 11/03/2014   Procedure: Lower Extremity Angiography;  Surgeon: Lorretta Harp, MD;  Location: [place] [facility];  Service: Cardiovascular;  Laterality: N/A;   PERIPHERAL VASCULAR CATHETERIZATION N/A 01/29/2016   Procedure: Lower Extremity Angiography;  Surgeon: Lorretta Harp, MD;  Location: [place] [facility];  Service: Cardiovascular;  Laterality: N/A;   POLYPECTOMY  07/02/2019   Procedure: POLYPECTOMY;  Surgeon: Rogene Houston, MD;  [facility];  Service: Endoscopy;;   POSTERIOR LUMBAR FUSION  2008 X 2   REDUCTION MAMMAPLASTY  1996?   TONSILLECTOMY AND ADENOIDECTOMY  1959 AND 2000   UVULOPALATOPHARYNGOPLASTY, TONSILLECTOMY AND SEPTOPLASTY  2000's   Social History:  reports that she has been smoking cigarettes. She has a 20.50 pack-year smoking history. She has never used smokeless tobacco. She reports that she does not drink alcohol and does not use drugs. Family History:  Family History  Problem Relation Age of Onset   Breast cancer Mother    Heart disease Mother    Hypertension Mother    Diabetes Father    Heart disease Father    Stroke Father    Heart disease Sister    Hypertension Sister    Hypertension Sister    Hyperlipidemia Sister    Diabetes Sister    Allergic rhinitis Sister    Emphysema Other        great uncle   Aneurysm Sister        brain   Colon cancer Paternal  Uncle    Angioedema Neg Hx    Asthma Neg Hx    Eczema Neg Hx     Immunodeficiency Neg Hx    Urticaria Neg Hx      HOME MEDICATIONS: Allergies as of 08/06/2022       Reactions   Sulfa Antibiotics Shortness Of Breath, Palpitations   Codeine Other (See Comments)   Recovering Addict does not like to take Narcotics   Fish Allergy Hives, Swelling   Tongue swelling   Iodine Swelling   Metformin And Related Diarrhea   Soliqua [insulin Glargine-lixisenatide] Itching, Other (See Comments)   "tongue swelling"   Adhesive [tape] Rash   Paper tape is ok   Shellfish Allergy Swelling, Rash   Tongue swelling        Medication List        Accurate as of August 06, 2022  4:07 PM. If you have any questions, ask your nurse or doctor.          acetaminophen 325 MG tablet Commonly known as: TYLENOL Take 2 tablets (650 mg total) by mouth every 6 (six) hours as needed for mild pain (or Fever >/= 101).   albuterol 108 (90 Base) MCG/ACT inhaler Commonly known as: ProAir HFA 1-2 inhalations every 4-6 hours as needed for cough or wheeze. What changed:  how much to take how to take this when to take this reasons to take this additional instructions   ALPRAZolam 0.5 MG tablet Commonly known as: XANAX Take 0.5 mg by mouth 2 (two) times daily as needed for anxiety.   amitriptyline 25 MG tablet Commonly known as: ELAVIL Take 1 tablet (25 mg total) by mouth at bedtime.   aspirin EC 81 MG tablet Take 1 tablet by mouth daily.   Azelastine HCl 0.15 % Soln Place 2 sprays into both nostrils 2 (two) times daily. What changed:  when to take this reasons to take this   bisacodyl 5 MG EC tablet Commonly known as: DULCOLAX Take 5 mg by mouth daily as needed for moderate constipation.   clopidogrel 75 MG tablet Commonly known as: PLAVIX Take 1 tablet (75 mg total) by mouth daily.   cyclobenzaprine 10 MG tablet Commonly known as: FLEXERIL Take 1 tablet by mouth as needed.   Dexcom G6 Receiver Devi by Does not apply route.   doxycycline 100 MG  capsule Commonly known as: VIBRAMYCIN Take 1 capsule (100 mg total) by mouth 2 (two) times daily. What changed:  when to take this reasons to take this   empagliflozin 25 MG Tabs tablet Commonly known as: Jardiance Take 1 tablet (25 mg total) by mouth daily before breakfast.   EPINEPHrine 0.3 mg/0.3 mL Soaj injection Commonly known as: EPI-PEN Inject 0.3 mLs (0.3 mg total) into the muscle once.   fluconazole 150 MG tablet Commonly known as: DIFLUCAN Take 1 tablet by mouth as needed.   Fluocinolone Acetonide Body 0.01 % Oil Apply topically 2 (two) times daily.   fluticasone 50 MCG/ACT nasal spray Commonly known as: FLONASE SPRAY 2 SPRAYS INTO EACH NOSTRIL EVERY DAY   insulin lispro 100 UNIT/ML injection Commonly known as: HUMALOG Max daily 60 units   levocetirizine 5 MG tablet Commonly known as: XYZAL Take 5 mg by mouth daily.   loperamide 2 MG capsule Commonly known as: IMODIUM Take 2 mg by mouth daily.   loratadine 10 MG tablet Commonly known as: CLARITIN Take 1 tablet by mouth daily.   losartan-hydrochlorothiazide 50-12.5 MG tablet Commonly known as: Hyzaar  Take 1 tablet by mouth daily.   lubiprostone 24 MCG capsule Commonly known as: AMITIZA Take 1 capsule (24 mcg total) by mouth 2 (two) times daily with a meal.   Magnesium 200 MG Tabs Take 1 tablet (200 mg total) by mouth daily.   montelukast 10 MG tablet Commonly known as: SINGULAIR Take 10 mg by mouth daily.   mupirocin ointment 2 % Commonly known as: BACTROBAN Apply 1 application topically 2 (two) times daily. What changed:  when to take this reasons to take this   nitroGLYCERIN 0.4 MG SL tablet Commonly known as: NITROSTAT PLACE 1 TAB UNDER TONGUE EVERY 5 MINS AS NEEDED FOR CHEST PAIN - MAX 3 DOSES THEN 911   Omnipod 5 G6 Pods (Gen 5) Misc 1 Device by Other route every 3 (three) days. Change every 72 hours   pantoprazole 40 MG tablet Commonly known as: PROTONIX TAKE 1 TABLET BY MOUTH  EVERY DAY   polyethylene glycol 17 g packet Commonly known as: MIRALAX / GLYCOLAX Take 17 g by mouth daily as needed for moderate constipation or severe constipation.   potassium chloride 10 MEQ tablet Commonly known as: KLOR-CON Take 10 mEq by mouth daily.   propranolol ER 60 MG 24 hr capsule Commonly known as: INDERAL LA Take 1 capsule (60 mg total) by mouth every evening.   rosuvastatin 40 MG tablet Commonly known as: CRESTOR Take 1 tablet (40 mg total) by mouth daily.   SUMAtriptan 100 MG tablet Commonly known as: IMITREX Take 100 mg by mouth every 2 (two) hours as needed for migraine.   temazepam 30 MG capsule Commonly known as: RESTORIL Take 30 mg by mouth at bedtime as needed for sleep.   traMADol 50 MG tablet Commonly known as: ULTRAM Take 50 mg by mouth 3 (three) times daily as needed.         OBJECTIVE:   Vital Signs: BP 128/80   Pulse 92   Ht 5' 3.5" (1.613 m)   Wt 134 lb (60.8 kg)   SpO2 97%   BMI 23.36 kg/m   Wt Readings from Last 3 Encounters:  08/06/22 134 lb (60.8 kg)  07/17/22 135 lb 6.4 oz (61.4 kg)  05/27/22 136 lb 12.8 oz (62.1 kg)     Exam: General: Pt appears well and is in NAD  Lungs: Clear with good BS bilat   Heart: RRR   Extremities: No pretibial edema.  Neuro: MS is good with appropriate affect, pt is alert and Ox3      DM foot exam: 05/27/2022   The skin of the feet is intact without sores or ulcerations. The pedal pulses are 1 + bilaterally  The sensation is intact to a screening 5.07, 10 gram monofilament bilaterally      DATA REVIEWED:   Latest Reference Range & Units 03/01/22 15:05  Sodium 135 - 145 mmol/L 143  Potassium 3.5 - 5.1 mmol/L 4.1  Chloride 98 - 111 mmol/L 105  Glucose 70 - 99 mg/dL 85  BUN 8 - 23 mg/dL 11  Creatinine 0.44 - 1.00 mg/dL 0.70  Calcium Ionized 1.15 - 1.40 mmol/L 1.23   01/18/2022 Tg 96 LDL 60 GFR 73  Glutamic Acid Decarb Ab <5 IU/mL <5     ISLET CELL ANTIBODY SCREEN Negative   ASSESSMENT / PLAN / RECOMMENDATIONS:   1) Type 2 Diabetes Mellitus, with poorly controlled, 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 Goal A1c < 7.0 %.    -  Patient has been noted with recurrent hypoglycemia during the day and at night, I have reduced her basal rate as below -I have also adjusted her sensitivity factor from 15 to 20 -Has Hx of pancreatitis in 2010, so DPP-4 inhibitors and GLp-1 agonists are  CONTRAINDICATED - GAD-65 and Islet cell Ab's negative     MEDICATIONS: continue  Jardiance 25 mg daily  Humalog   Pump   Omnipod Settings   Insulin type   Humalog    Basal rate       0000  1.30 u/h    0400 1.45   0800 1.6   1200 1.8      I:C ratio       0000 1:6                  Sensitivity       0000  20      Goal       0000  120         EDUCATION / INSTRUCTIONS: BG monitoring instructions: Patient is instructed to check her blood sugars 4 times a day, before meals and bedtime . Call [place] [facility] if: BG persistently < 70  I reviewed the Rule of 15 for the treatment of hypoglycemia in detail with the patient. Literature supplied.   2) Diabetic complications:  Eye: Does not have known diabetic retinopathy.  Neuro/ Feet: Does  have known diabetic peripheral neuropathy .  Renal: Patient does not have known baseline CKD.    3) Dyslipidemia:  - LDL has been at goal  - No changes     Medication Continue rosuvastatin 40 mg daily    4) Microalbuminuria :   -  MA/Cr ratio slightly elevated at 48 in February 2023.  - She is on Losartan- HCTZ  -We will recheck on next visit, if not done by PCP by then     F/U 6 months   Signed electronically by: Mack Guise, MD  [facility]  [address] [place], [place] [address] Phone: [phone] FAX: [phone]   CC: Kristie Cowman, [place]  [address] Phone: [phone]  Fax: [phone]  Return to Endocrinology clinic as below: Future Appointments  Date Time Provider [place]  08/23/2022 11:30 AM Barbaraann Faster, RN [name] None  09/17/2022  3:30 PM Gatha Mayer, MD [facility]  11/29/2022 11:10 AM Godwin Tedesco, Melanie Crazier, MD [facility] None

## 2022-08-06 NOTE — Patient Instructions (Signed)

## 2022-08-12 ENCOUNTER — Encounter: Payer: Self-pay | Admitting: Internal Medicine

## 2022-08-19 ENCOUNTER — Ambulatory Visit: Payer: 59 | Admitting: Internal Medicine

## 2022-08-19 DIAGNOSIS — E109 Type 1 diabetes mellitus without complications: Secondary | ICD-10-CM | POA: Diagnosis not present

## 2022-08-19 DIAGNOSIS — E559 Vitamin D deficiency, unspecified: Secondary | ICD-10-CM | POA: Diagnosis not present

## 2022-08-19 DIAGNOSIS — R5383 Other fatigue: Secondary | ICD-10-CM | POA: Diagnosis not present

## 2022-08-19 DIAGNOSIS — Z87891 Personal history of nicotine dependence: Secondary | ICD-10-CM | POA: Diagnosis not present

## 2022-08-19 DIAGNOSIS — I10 Essential (primary) hypertension: Secondary | ICD-10-CM | POA: Diagnosis not present

## 2022-08-23 ENCOUNTER — Encounter: Payer: 59 | Admitting: *Deleted

## 2022-08-26 ENCOUNTER — Ambulatory Visit: Payer: Self-pay | Admitting: *Deleted

## 2022-08-26 NOTE — Patient Instructions (Addendum)
Visit Information  Thank you for taking time to visit with me today. Please don't hesitate to contact me if I can be of assistance to you.   Following are the goals we discussed today:   Goals Addressed               This Visit's Progress     Patient Stated     COMPLETED: Find local providers [facility]) (pt-stated)   On track     Care Coordination Interventions:  Established care with local pcp Goal met      COMPLETED: Just want someone to talk to [facility]) (pt-stated)   On track     Care Coordination Interventions: Duplicate goal Goal completed      COMPLETED: manage hypoglycemia ([name]) (pt-stated)        Care Coordination Interventions:  Duplicate goal Goal completed      Other     COMPLETED: manage IBS, Diverticulosis   On track     Duplicate goal Goal completed      [facility] care coordination services   On track     Interventions Today    Flowsheet Row Most Recent Value  Chronic Disease   Chronic disease during today's visit --  [pcp, support for social concerns]  General Interventions   General Interventions Discussed/Reviewed Doctor Visits, General Interventions Reviewed  Doctor Visits Discussed/Reviewed Doctor Visits Reviewed  PCP/Specialist Visits Compliance with follow-up visit  Exercise Interventions   Exercise Discussed/Reviewed Exercise Reviewed, Physical Activity  Physical Activity Discussed/Reviewed Physical Activity Reviewed  Nutrition Interventions   Nutrition Discussed/Reviewed Nutrition Reviewed              Our next appointment is by telephone on 09/23/22 at 1 pm  Please call the care guide team at [phone] if you need to cancel or reschedule your appointment.   If you are experiencing a Mental Health or Behavioral Health Crisis or need someone to talk to, please call the Suicide and Crisis Lifeline: 988 call the [place] National Suicide Prevention Lifeline: 938 844 3663 or TTY: 201-478-8635 TTY 331-452-8782) to talk to a trained counselor call  1-800-273-TALK (toll free, 24 hour hotline) call the [facility]: (989)298-6535 call 911   Patient verbalizes understanding of instructions and care plan provided today and agrees to view in MyChart. Active MyChart status and patient understanding of how to access instructions and care plan via MyChart confirmed with patient.     The patient has been provided with contact information for the care management team and has been advised to call with any health related questions or concerns.   Ilani Otterson L. Noelle Penner, RN, BSN, CCM [facility] Care Management Community Coordinator Office number [phone]

## 2022-08-26 NOTE — Patient Outreach (Signed)
  Care Coordination   Follow Up Visit Note   08/26/2022 Name: [name] MRN: [id number] DOB: 1956/03/22  [name] is a 67 y.o. year old female who sees Knox Royalty, MD for primary care. I spoke with  [name] by phone today.  What matters to the patients health and wellness today?  September 03 2022 is the next scheduled pcp visit Spoke about social concerns    Goals Addressed               This Visit's Progress     Patient Stated     COMPLETED: Find local providers [facility]) (pt-stated)   On track     Care Coordination Interventions:  Established care with local pcp Goal met      COMPLETED: Just want someone to talk to [facility]) (pt-stated)   On track     Care Coordination Interventions: Duplicate goal Goal completed      COMPLETED: manage hypoglycemia ([name]) (pt-stated)        Care Coordination Interventions:  Duplicate goal Goal completed      Other     COMPLETED: manage IBS, Diverticulosis   On track     Duplicate goal Goal completed      [facility] care coordination services   On track     Interventions Today    Flowsheet Row Most Recent Value  Chronic Disease   Chronic disease during today's visit --  [pcp, support for social concerns]  General Interventions   General Interventions Discussed/Reviewed Doctor Visits, General Interventions Reviewed  Doctor Visits Discussed/Reviewed Doctor Visits Reviewed  PCP/Specialist Visits Compliance with follow-up visit  Exercise Interventions   Exercise Discussed/Reviewed Exercise Reviewed, Physical Activity  Physical Activity Discussed/Reviewed Physical Activity Reviewed  Nutrition Interventions   Nutrition Discussed/Reviewed Nutrition Reviewed              SDOH assessments and interventions completed:  No     Care Coordination Interventions:  Yes, provided   Follow up plan: Follow up call scheduled for 09/23/22    Encounter Outcome:  Pt. Visit Completed   Margaretta Chittum L. Noelle Penner, RN, BSN, CCM [facility] Care  Management Community Coordinator Office number [phone]

## 2022-09-03 DIAGNOSIS — R030 Elevated blood-pressure reading, without diagnosis of hypertension: Secondary | ICD-10-CM | POA: Diagnosis not present

## 2022-09-03 DIAGNOSIS — J302 Other seasonal allergic rhinitis: Secondary | ICD-10-CM | POA: Diagnosis not present

## 2022-09-03 DIAGNOSIS — E876 Hypokalemia: Secondary | ICD-10-CM | POA: Diagnosis not present

## 2022-09-03 DIAGNOSIS — K644 Residual hemorrhoidal skin tags: Secondary | ICD-10-CM | POA: Diagnosis not present

## 2022-09-03 DIAGNOSIS — F17210 Nicotine dependence, cigarettes, uncomplicated: Secondary | ICD-10-CM | POA: Diagnosis not present

## 2022-09-03 DIAGNOSIS — R748 Abnormal levels of other serum enzymes: Secondary | ICD-10-CM | POA: Diagnosis not present

## 2022-09-03 DIAGNOSIS — Z87891 Personal history of nicotine dependence: Secondary | ICD-10-CM | POA: Diagnosis not present

## 2022-09-03 DIAGNOSIS — E1149 Type 2 diabetes mellitus with other diabetic neurological complication: Secondary | ICD-10-CM | POA: Diagnosis not present

## 2022-09-03 DIAGNOSIS — E109 Type 1 diabetes mellitus without complications: Secondary | ICD-10-CM | POA: Diagnosis not present

## 2022-09-03 DIAGNOSIS — E1165 Type 2 diabetes mellitus with hyperglycemia: Secondary | ICD-10-CM | POA: Diagnosis not present

## 2022-09-03 DIAGNOSIS — E559 Vitamin D deficiency, unspecified: Secondary | ICD-10-CM | POA: Diagnosis not present

## 2022-09-03 DIAGNOSIS — I10 Essential (primary) hypertension: Secondary | ICD-10-CM | POA: Diagnosis not present

## 2022-09-03 DIAGNOSIS — Z91013 Allergy to seafood: Secondary | ICD-10-CM | POA: Diagnosis not present

## 2022-09-06 ENCOUNTER — Telehealth: Payer: Self-pay | Admitting: Dietician

## 2022-09-06 NOTE — Telephone Encounter (Signed)
Returned patient call.  She had pump/CGM issues but now has it solved through tech support.  Oran Rein, RD, LDN, CDCES

## 2022-09-17 ENCOUNTER — Encounter: Payer: Self-pay | Admitting: Internal Medicine

## 2022-09-17 ENCOUNTER — Ambulatory Visit (INDEPENDENT_AMBULATORY_CARE_PROVIDER_SITE_OTHER): Payer: 59 | Admitting: Internal Medicine

## 2022-09-17 VITALS — BP 104/70 | HR 100 | Ht 63.5 in | Wt 131.8 lb

## 2022-09-17 DIAGNOSIS — R1013 Epigastric pain: Secondary | ICD-10-CM | POA: Diagnosis not present

## 2022-09-17 DIAGNOSIS — K581 Irritable bowel syndrome with constipation: Secondary | ICD-10-CM | POA: Diagnosis not present

## 2022-09-17 DIAGNOSIS — Z6822 Body mass index (BMI) 22.0-22.9, adult: Secondary | ICD-10-CM | POA: Diagnosis not present

## 2022-09-17 DIAGNOSIS — R252 Cramp and spasm: Secondary | ICD-10-CM | POA: Diagnosis not present

## 2022-09-17 DIAGNOSIS — N3090 Cystitis, unspecified without hematuria: Secondary | ICD-10-CM | POA: Diagnosis not present

## 2022-09-17 MED ORDER — NORTRIPTYLINE HCL 10 MG PO CAPS: 10.0000 mg | ORAL_CAPSULE | Freq: Every day | ORAL | 5 refills | Status: DC

## 2022-09-17 MED ORDER — LUBIPROSTONE 8 MCG PO CAPS: 8.0000 ug | ORAL_CAPSULE | Freq: Two times a day (BID) | ORAL | 5 refills | Status: DC

## 2022-09-17 MED ORDER — MAGNESIUM 200 MG PO TABS: 200.0000 mg | ORAL_TABLET | Freq: Every day | ORAL | 6 refills | Status: AC

## 2022-09-17 NOTE — Patient Instructions (Signed)
We are going to request your records from Dr Knox Royalty be sent to [place] for review.   We have sent the medications to your pharmacy for you to pick up at your convenience.   I appreciate the opportunity to care for you. Stan Head, MD, [facility]

## 2022-09-17 NOTE — Progress Notes (Signed)
[name] 66 y.o. 1956-03-24 [id number]  Assessment & Plan:   Encounter Diagnoses  Name Primary?   Dyspepsia Yes   Abdominal pain, epigastric    Irritable bowel syndrome with constipation    Her chronic problems persist.  Not as bad as she has been at some times.  She did not tolerate amitriptyline.  I am going to try nortriptyline instead, because she had diarrhea when she was taking 24 mcg lubiprostone I will try 8 mcg.  I explained we were trying to treat constipation and to do that without causing diarrhea and needing antidiarrheal medication.  I have refilled her magnesium tablets at her request she takes those to prevent muscle cramps.  She will return here in about 3 months routinely.  Sooner as needed. Meds ordered this encounter  Medications   Magnesium 200 MG TABS    Sig: Take 1 tablet (200 mg total) by mouth daily.    Dispense:  30 tablet    Refill:  6   lubiprostone (AMITIZA) 8 MCG capsule    Sig: Take 1 capsule (8 mcg total) by mouth 2 (two) times daily with a meal.    Dispense:  60 capsule    Refill:  5   nortriptyline (PAMELOR) 10 MG capsule    Sig: Take 1 capsule (10 mg total) by mouth at bedtime.    Dispense:  30 capsule    Refill:  5   I have requested laboratory testing and office notes from her current primary care provider, that were performed this year, to try to get a handle on those results.  Further plans pending that.  RTC 3 mos   Subjective:   Chief Complaint: Dyspepsia  HPI 67 year old African-American woman with a long history of dyspepsia and IBS, last seen in March at which point I tried Carafate and that was not helping so then I put her on amitriptyline 25 mg at bedtime.  That made her very sleepy and hung over so she stopped it after a week.  She had stopped Carafate prior to that.  She is asking about getting a prescription for lubiprostone again as she recalls that helped her constipation and she struggling again though she is  using some MiraLAX and Dulcolax.  She did report that she used to have to take loperamide at times when she was on the 24 mcg lubiprostone prescribed by [name] last year.  She is also asking that I prescribe her magnesium tablets as her primary care provider put her on some sort of powder that she does not really want to take.  PAST GI PROCEDURES:   EGD 07/01/2019: - Normal esophagus. - Z-line irregular, 35 cm from the incisors. - 2 cm hiatal hernia with focal erythema to mucosa below GEJ. - A Nissen fundoplication was found. The wrap appears loose. - Normal duodenal bulb, second portion of the duodenum and area of the papilla. - No specimens collected.   Colonoscopy 07/02/2019: - One 5 mm polyp at the hepatic flexure, removed with a cold snare. Resected and retrieved. - Diverticulosis in the sigmoid colon, in the descending colon and in the transverse colon.   Colonoscopy 03/02/2020: - Three 2 to 4 mm polyps in the proximal transverse colon and at the hepatic flexure, removed with a cold snare. Resected and retrieved. - Mild pancolonic diverticulosis. - Non-bleeding internal hemorrhoids. - The examined portion of the ileum was normal. - The examination was otherwise normal on direct and retroflexion views. No  masses. - 5 year colonoscopy recall  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   Fish Allergy Hives and Swelling    Tongue swelling   Iodine Swelling   Metformin And Related Diarrhea   [name] [Insulin Glargine-Lixisenatide] Itching and Other (See Comments)    "tongue swelling"   Adhesive [Tape] Rash    Paper tape is ok   Shellfish Allergy Swelling and Rash    Tongue swelling   Current Meds  Medication Sig   acetaminophen (TYLENOL) 325 MG tablet Take 2 tablets (650 mg total) by mouth every 6 (six) hours as needed for mild  pain (or Fever >/= 101).   albuterol (PROAIR HFA) 108 (90 Base) MCG/ACT inhaler 1-2 inhalations every 4-6 hours as needed for cough or wheeze. (Patient taking differently: Inhale 1-2 puffs into the lungs every 4 (four) hours as needed for shortness of breath.)   ALPRAZolam (XANAX) 0.5 MG tablet Take 0.5 mg by mouth 2 (two) times daily as needed for anxiety.   amitriptyline (ELAVIL) 25 MG tablet Take 1 tablet (25 mg total) by mouth at bedtime.   aspirin EC 81 MG tablet Take 1 tablet by mouth daily.   Azelastine HCl 0.15 % SOLN Place 2 sprays into both nostrils 2 (two) times daily. (Patient taking differently: Place 2 sprays into both nostrils 2 (two) times daily as needed (allergies).)   bisacodyl (DULCOLAX) 5 MG EC tablet Take 5 mg by mouth daily as needed for moderate constipation.   clopidogrel (PLAVIX) 75 MG tablet Take 1 tablet (75 mg total) by mouth daily.   Continuous Blood Gluc Receiver (DEXCOM G6 RECEIVER) DEVI by Does not apply route.   cyclobenzaprine (FLEXERIL) 10 MG tablet Take 1 tablet by mouth as needed.   doxycycline (VIBRAMYCIN) 100 MG capsule Take 1 capsule (100 mg total) by mouth 2 (two) times daily. (Patient taking differently: Take 100 mg by mouth as needed.)   empagliflozin (JARDIANCE) 25 MG TABS tablet Take 1 tablet (25 mg total) by mouth daily before breakfast.   EPINEPHrine 0.3 mg/0.3 mL IJ SOAJ injection Inject 0.3 mLs (0.3 mg total) into the muscle once.   fluconazole (DIFLUCAN) 150 MG tablet Take 1 tablet by mouth as needed.   Fluocinolone Acetonide Body 0.01 % OIL Apply topically 2 (two) times daily.   fluticasone (FLONASE) 50 MCG/ACT nasal spray SPRAY 2 SPRAYS INTO EACH NOSTRIL EVERY DAY   Insulin Disposable Pump (OMNIPOD 5 G6 POD, GEN 5,) MISC 1 Device by Other route every 3 (three) days. Change every 72 hours   insulin lispro (HUMALOG) 100 UNIT/ML injection Max daily 60 units   levocetirizine (XYZAL) 5 MG tablet Take 5 mg by mouth daily.   loperamide (IMODIUM) 2 MG  capsule Take 2 mg by mouth daily.   loratadine (CLARITIN) 10 MG tablet Take 1 tablet by mouth daily.   losartan-hydrochlorothiazide (HYZAAR) 50-12.5 MG tablet Take 1 tablet by mouth daily.   lubiprostone (AMITIZA) 24 MCG capsule Take 1 capsule (24 mcg total) by mouth 2 (two) times daily with a meal.   Magnesium 200 MG TABS Take 1 tablet (200 mg total) by mouth daily.   montelukast (SINGULAIR) 10 MG tablet Take 10 mg by mouth daily.   mupirocin ointment (BACTROBAN) 2 % Apply 1 application topically 2 (two) times daily. (Patient taking differently: Apply 1 application  topically 2 (two) times daily as  needed.)   nitroGLYCERIN (NITROSTAT) 0.4 MG SL tablet PLACE 1 TAB UNDER TONGUE EVERY 5 MINS AS NEEDED FOR CHEST PAIN - MAX 3 DOSES THEN 911   pantoprazole (PROTONIX) 40 MG tablet TAKE 1 TABLET BY MOUTH EVERY DAY (Patient taking differently: Take 40 mg by mouth daily.)   polyethylene glycol (MIRALAX / GLYCOLAX) 17 g packet Take 17 g by mouth daily as needed for moderate constipation or severe constipation.   potassium chloride (KLOR-CON) 10 MEQ tablet Take 10 mEq by mouth daily.   propranolol ER (INDERAL LA) 60 MG 24 hr capsule Take 1 capsule (60 mg total) by mouth every evening.   rosuvastatin (CRESTOR) 40 MG tablet Take 1 tablet (40 mg total) by mouth daily.   SUMAtriptan (IMITREX) 100 MG tablet Take 100 mg by mouth every 2 (two) hours as needed for migraine.    temazepam (RESTORIL) 30 MG capsule Take 30 mg by mouth at bedtime as needed for sleep.   traMADol (ULTRAM) 50 MG tablet Take 50 mg by mouth 3 (three) times daily as needed.   Past Medical History:  Diagnosis Date   Allergic urticaria 07/10/2015   Allergy    Anemia    hx   Angioedema 07/10/2015   Anxiety    Arthritis    "neck, left hand" (09/14/2012)   Asthma    Cancer (HCC) 1985   ovarian, no treatment except surgery   Cataract    Complication of anesthesia    OCCASIONAL TROUBLE TURNING NECK TO RIGHT   Critical lower limb ischemia  (HCC)    10/2014 s/p L SFA stenting   Diverticulosis of colon with hemorrhage April 2013   GERD (gastroesophageal reflux disease)    GI bleed    H/O hiatal hernia    Heart attack (HCC)    2003 mild MI, March 2013 mild MI   Hidradenitis    groin   History of blood transfusion 1985 AND 2013   Hyperlipidemia    Hypertension    Irritable bowel syndrome    Left-sided weakness    since stroke, left eye trouble seeing   Migraines    Mild CAD    a. Cath 09/2010: mild luminal irregularities of LAD, 30% prox RCA and 20-30% mRCA, EF 65%.   Neuropathy    Obesity    Osteoporosis    PAD (peripheral artery disease) (HCC)    a. critical limb ischemia s/p PTA/stenting of L SFA 10/2014. c. occ prior SFA stent by angio 01/2016, for possible PV bypass.   Pneumonia    baby   Recurrent upper respiratory infection (URI)    S/P arterial stent-mid Lt SFA 11/03/14 11/04/2014   Schatzki's ring    Sinus problem    Stomach ulcer 1972   non-bleeding   Stroke (HCC) 07-2007, 07-2008, 07-2009   total 3 strokes; mild left sided weakness and left eye "jumps".   Type II diabetes mellitus (HCC)    Vitamin B 12 deficiency 07-15-2013   Past Surgical History:  Procedure Laterality Date   ABDOMINAL HYSTERECTOMY  1985   ADENOIDECTOMY     ANTERIOR CERVICAL DECOMP/DISCECTOMY FUSION  2002   ANTERIOR CERVICAL DECOMP/DISCECTOMY FUSION N/A 04/08/2014   Procedure: Cervical Six-Seven ANTERIOR CERVICAL DECOMPRESSION/DISCECTOMY FUSION Plating and Bonegraft  2 LEVELS;  Surgeon: Coletta Memos, MD;  Location: [facility];  Service: Neurosurgery;  Laterality: N/A;  Cervical Six-Seven ANTERIOR CERVICAL DECOMPRESSION/DISCECTOMY FUSION Plating and Bonegraft  2 LEVELS   APPENDECTOMY  1985   AXILLARY HIDRADENITIS EXCISION  1990-2008  bilateral   BACK SURGERY     BREAST BIOPSY Right 2007   BREAST CYST EXCISION Right 2008   BREAST REDUCTION SURGERY     CARDIAC CATHETERIZATION  2004   mild disease   CATARACT EXTRACTION W/PHACO Left  05/16/2015   Procedure: CATARACT EXTRACTION PHACO AND INTRAOCULAR LENS PLACEMENT (IOC);  Surgeon: Jethro Bolus, MD;  Location: [facility];  Service: Ophthalmology;  Laterality: Left;  CDE: 4.24   CATARACT EXTRACTION W/PHACO Right 05/30/2015   Procedure: CATARACT EXTRACTION RIGHT EYE PHACO AND INTRAOCULAR LENS PLACEMENT ;  Surgeon: Jethro Bolus, MD;  Location: [facility];  Service: Ophthalmology;  Laterality: Right;  CDE:4.08   CHOLECYSTECTOMY  1990's   COLONOSCOPY  08/12/2011   Procedure: COLONOSCOPY;  Surgeon: Meryl Dare, MD,FACG;  Location: [facility] ENDOSCOPY;  Service: Endoscopy;  Laterality: N/A;   COLONOSCOPY  06/19/2006   COLONOSCOPY WITH PROPOFOL N/A 07/02/2019   Procedure: COLONOSCOPY WITH PROPOFOL;  Surgeon: Malissa Hippo, MD;  Location: [facility];  Service: Endoscopy;  Laterality: N/A;   cyst thigh Right    ESOPHAGOGASTRODUODENOSCOPY  08/12/2011   Procedure: ESOPHAGOGASTRODUODENOSCOPY (EGD);  Surgeon: Meryl Dare, MD,FACG;  Location: [facility] ENDOSCOPY;  Service: Endoscopy;  Laterality: N/A;   ESOPHAGOGASTRODUODENOSCOPY  06/04/2005   ESOPHAGOGASTRODUODENOSCOPY (EGD) WITH PROPOFOL N/A 07/01/2019   Procedure: ESOPHAGOGASTRODUODENOSCOPY (EGD) WITH PROPOFOL;  Surgeon: Malissa Hippo, MD;  [facility];  Service: Endoscopy;  Laterality: N/A;   FEMORAL-POPLITEAL BYPASS GRAFT Left 06/19/2016   Procedure: Left Leg BYPASS GRAFT FEMORAL-POPLITEAL ARTERY;  Surgeon: Nada Libman, MD;  Location: [facility] OR;  Service: Vascular;  Laterality: Left;   GIVENS CAPSULE STUDY  08/13/2011   Procedure: GIVENS CAPSULE STUDY;  Surgeon: Meryl Dare, MD,FACG;  Location: [facility] ENDOSCOPY;  Service: Endoscopy;  Laterality: N/A;   HAMMER TOE SURGERY Bilateral ~ 2000   HEMOSTASIS CLIP PLACEMENT  07/02/2019   Procedure: HEMOSTASIS CLIP PLACEMENT;  Surgeon: Malissa Hippo, MD;  Location: [facility];  Service: Endoscopy;;  hepatic flexure   HIATAL HERNIA REPAIR     HYDRADENITIS EXCISION  01/2011; 03/2012   'groin and  abdomen; 03/2012" (09/14/2012)   HYDRADENITIS EXCISION  04/01/2012   Procedure: EXCISION HYDRADENITIS GROIN;  Surgeon: Valarie Merino, MD;  Location: [facility] ORS;  Service: General;  Laterality: Bilateral;  Excision of Hydradenitis of Perineum   HYDRADENITIS EXCISION N/A 09/17/2013   Procedure: EXCISION PERINEAL HIDRADENITIS ;  Surgeon: Valarie Merino, MD;  Location: [facility] ORS;  Service: General;  Laterality: N/A;  also in the pubis area   LEFT HEART CATH AND CORONARY ANGIOGRAPHY N/A 12/26/2016   Procedure: LEFT HEART CATH AND CORONARY ANGIOGRAPHY;  Surgeon: Swaziland, Peter M, MD;  Location: [facility] INVASIVE CV LAB;  Service: Cardiovascular;  Laterality: N/A;   MASS EXCISION Right 09/17/2013   Procedure: EXCISION MASS;  Surgeon: Valarie Merino, MD;  Location: [facility] ORS;  Service: General;  Laterality: Right;   NISSEN FUNDOPLICATION  1996   PERIPHERAL VASCULAR CATHETERIZATION N/A 11/03/2014   Procedure: Lower Extremity Angiography;  Surgeon: Runell Gess, MD;  Location: [facility] INVAS[facility] CV LAB;  Service: Cardiovascular;  Laterality: N/A;   PERIPHERAL VASCULAR CATHETERIZATION N/A 01/29/2016   Procedure: Lower Extremity Angiography;  Surgeon: Runell Gess, MD;  Location: [facility] INVASIVE CV LAB;  Service: Cardiovascular;  Laterality: N/A;   POLYPECTOMY  07/02/2019   Procedure: POLYPECTOMY;  Surgeon: Malissa Hippo, MD;  Location: [facility];  Service: Endoscopy;;   POSTERIOR LUMBAR FUSION  2008 X 2  REDUCTION MAMMAPLASTY  1996?   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  Divorced, living alone in [place] after relocated from [place] in October 2023   01/03/22 working at [employer] in [place]-2024  working full-time Merchant navy officer   She is a cigarette smoker, no alcohol no current drug use history of crack cocaine   family history includes Allergic rhinitis in her sister; Aneurysm in her sister; Breast cancer in her mother; Colon cancer in her paternal uncle; Diabetes in her father and sister; Emphysema in an other family member; Heart disease in her father, mother, and sister; Hyperlipidemia in her sister; Hypertension in her mother, sister, and sister; Stroke in her father.   Review of Systems As above  Objective:   Physical Exam BP 104/70   Pulse 100   Ht 5' 3.5" (1.613 m)   Wt 131 lb 12.8 oz (59.8 kg)   BMI 22.98 kg/m

## 2022-09-23 ENCOUNTER — Ambulatory Visit: Payer: Self-pay | Admitting: *Deleted

## 2022-09-23 NOTE — Patient Outreach (Signed)
  Care Coordination   Follow Up Visit Note   09/27/2022 late entry for 09/23/22 Name: [name] MRN: [id number] DOB: [birth year]  [name] is a 67 y.o. year old female who sees Knox Royalty, MD for primary care. I spoke with  [name] by phone today.  What matters to the patients health and wellness today?  Medical services, recent family death (uncle, friend) hamilton roach ric[place]   Goals Addressed             This Visit's Progress    [name] care coordination services   On track    Interventions Today    Flowsheet Row Most Recent Value  Chronic Disease   Chronic disease during today's visit Diabetes, Other  [pcp services GI management, social]  General Interventions   General Interventions Discussed/Reviewed General Interventions Reviewed, Doctor Visits, Communication with  Doctor Visits Discussed/Reviewed Doctor Visits Reviewed, Specialist, PCP  PCP/Specialist Visits Compliance with follow-up visit  Education Interventions   Education Provided Provided Education  Provided Verbal Education On Insurance Plans, Other  [pcp services]              SDOH assessments and interventions completed:  No     Care Coordination Interventions:  Yes, provided   Follow up plan: Follow up call scheduled for 10/28/22    Encounter Outcome:  Pt. Visit Completed   Bo Rogue L. Noelle Penner, RN, BSN, CCM [facility] Care Management Community Coordinator Office number [phone]

## 2022-09-23 NOTE — Patient Instructions (Signed)
Visit Information  Thank you for taking time to visit with me today. Please don't hesitate to contact me if I can be of assistance to you.    [phone]  Following are the goals we discussed today:   Goals Addressed             This Visit's Progress    [name] care coordination services   On track    Interventions Today    Flowsheet Row Most Recent Value  Chronic Disease   Chronic disease during today's visit Diabetes, Other  [pcp services GI management, social]  General Interventions   General Interventions Discussed/Reviewed General Interventions Reviewed, Doctor Visits, Communication with  Doctor Visits Discussed/Reviewed Doctor Visits Reviewed, Specialist, PCP  PCP/Specialist Visits Compliance with follow-up visit  Education Interventions   Education Provided Provided Education  Provided Verbal Education On Insurance Plans, Other  [pcp services]              Our next appointment is by telephone on 10/03/22 at 1145  Please call the care guide team at [phone] if you need to cancel or reschedule your appointment.   If you are experiencing a Mental Health or Behavioral Health Crisis or need someone to talk to, please call the Suicide and Crisis Lifeline: 988 call the [place] National Suicide Prevention Lifeline: 847-152-9542 or TTY: 619-394-8233 TTY (563)040-6560) to talk to a trained counselor call 1-800-273-TALK (toll free, 24 hour hotline) go to [facility] [address] [place] [phone]) call 911   Patient verbalizes understanding of instructions and care plan provided today and agrees to view in MyChart. Active MyChart status and patient understanding of how to access instructions and care plan via MyChart confirmed with patient.     The patient has been provided with contact information for the care management team and has been advised to call with any health related questions or concerns.   Janthony Holleman L. Noelle Penner, RN,  BSN, CCM [facility] Care Management Community Coordinator Office number [phone]

## 2022-09-26 ENCOUNTER — Other Ambulatory Visit: Payer: Self-pay

## 2022-09-26 DIAGNOSIS — I70213 Atherosclerosis of native arteries of extremities with intermittent claudication, bilateral legs: Secondary | ICD-10-CM

## 2022-09-27 ENCOUNTER — Ambulatory Visit (INDEPENDENT_AMBULATORY_CARE_PROVIDER_SITE_OTHER): Payer: 59 | Admitting: Physician Assistant

## 2022-09-27 ENCOUNTER — Encounter: Payer: Self-pay | Admitting: *Deleted

## 2022-09-27 ENCOUNTER — Other Ambulatory Visit: Payer: Self-pay | Admitting: *Deleted

## 2022-09-27 ENCOUNTER — Ambulatory Visit (INDEPENDENT_AMBULATORY_CARE_PROVIDER_SITE_OTHER)
Admission: RE | Admit: 2022-09-27 | Discharge: 2022-09-27 | Disposition: A | Discharge status: Home / Self Care | Payer: 59 | Source: Ambulatory Visit | Attending: Vascular Surgery | Admitting: Vascular Surgery

## 2022-09-27 ENCOUNTER — Ambulatory Visit (HOSPITAL_COMMUNITY)
Admission: RE | Admit: 2022-09-27 | Discharge: 2022-09-27 | Disposition: A | Discharge status: Home / Self Care | Payer: 59 | Source: Ambulatory Visit | Attending: Vascular Surgery | Admitting: Vascular Surgery

## 2022-09-27 ENCOUNTER — Ambulatory Visit: Payer: Self-pay | Admitting: *Deleted

## 2022-09-27 VITALS — BP 132/72 | HR 79 | Temp 97.6°F | Resp 14 | Ht 64.0 in | Wt 132.0 lb

## 2022-09-27 DIAGNOSIS — I70222 Atherosclerosis of native arteries of extremities with rest pain, left leg: Secondary | ICD-10-CM

## 2022-09-27 DIAGNOSIS — T82898A Other specified complication of vascular prosthetic devices, implants and grafts, initial encounter: Secondary | ICD-10-CM | POA: Diagnosis not present

## 2022-09-27 DIAGNOSIS — I70213 Atherosclerosis of native arteries of extremities with intermittent claudication, bilateral legs: Secondary | ICD-10-CM | POA: Insufficient documentation

## 2022-09-27 LAB — VAS US ABI WITH/WO TBI
Left ABI: 0.59
Right ABI: 0.71

## 2022-09-27 NOTE — Progress Notes (Signed)
Office Note   History of Present Illness   [name] is a 67 y.o. (11/24/1955) female who presents as a triage visit.  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 Myra Gianotti.  This was done for lifestyle limiting claudication. Prior to bypass she had undergone atherectomy and angioplasty by Dr. Allyson Sabal. She was last seen by our office in 2023 and at that time she was asymptomatic.  She returns today as a triage visit.  Over the past 3 weeks she has been having rest pain in her left lower extremity.  She states most nights she wakes up from her sleep with a lot of pain in her left foot and calf.  Sometimes the pain is so severe it feels as if it radiates to her right leg.  Usually she has to dangle her feet off the bed or stand up to get relief from her pain.  She has also recently had a change in jobs requiring a longer walk from her parking space to her office.  She endorses worsening claudication in bilateral calves.  She denies any tissue loss of the lower extremities.  She has previously been prescribed aspirin and Plavix.  She states that Dr.Berry told her that she did not have to take her Plavix anymore and only had to take aspirin.   She no longer smokes cigarettes.  She does vape.   Current Outpatient Medications  Medication Sig Dispense Refill   acetaminophen (TYLENOL) 325 MG tablet Take 2 tablets (650 mg total) by mouth every 6 (six) hours as needed for mild pain (or Fever >/= 101). 12 tablet 2   albuterol (PROAIR HFA) 108 (90 Base) MCG/ACT inhaler 1-2 inhalations every 4-6 hours as needed for cough or wheeze. (Patient taking differently: Inhale 1-2 puffs into the lungs every 4 (four) hours as needed for shortness of breath.) 1 Inhaler 1   ALPRAZolam (XANAX) 0.5 MG tablet Take 0.5 mg by mouth 2 (two) times daily as needed for anxiety.     aspirin EC 81 MG tablet Take 1 tablet by mouth daily.     Azelastine HCl 0.15 %  SOLN Place 2 sprays into both nostrils 2 (two) times daily. (Patient taking differently: Place 2 sprays into both nostrils 2 (two) times daily as needed (allergies).) 30 mL 5   bisacodyl (DULCOLAX) 5 MG EC tablet Take 5 mg by mouth daily as needed for moderate constipation.     clopidogrel (PLAVIX) 75 MG tablet Take 1 tablet (75 mg total) by mouth daily. 30 tablet 2   Continuous Blood Gluc Receiver (DEXCOM G6 RECEIVER) DEVI by Does not apply route.     cyclobenzaprine (FLEXERIL) 10 MG tablet Take 1 tablet by mouth as needed.     doxycycline (VIBRAMYCIN) 100 MG capsule Take 1 capsule (100 mg total) by mouth 2 (two) times daily. (Patient taking differently: Take 100 mg by mouth as needed.) 14 capsule 0   empagliflozin (JARDIANCE) 25 MG TABS tablet Take 1 tablet (25 mg total) by mouth daily before breakfast. 90 tablet 3   EPINEPHrine 0.3 mg/0.3 mL IJ SOAJ injection Inject 0.3 mLs (0.3 mg total) into the muscle once. 1 Device 1   fluconazole (DIFLUCAN) 150 MG tablet Take 1 tablet by mouth as needed.     Fluocinolone Acetonide Body 0.01 % OIL Apply topically 2 (two) times daily.     fluticasone (FLONASE) 50 MCG/ACT nasal spray SPRAY 2  SPRAYS INTO EACH NOSTRIL EVERY DAY     Insulin Disposable Pump (OMNIPOD 5 G6 POD, GEN 5,) MISC 1 Device by Other route every 3 (three) days. Change every 72 hours 6 each 3   insulin lispro (HUMALOG) 100 UNIT/ML injection Max daily 60 units 60 mL 3   levocetirizine (XYZAL) 5 MG tablet Take 5 mg by mouth daily.     loperamide (IMODIUM) 2 MG capsule Take 2 mg by mouth daily.     loratadine (CLARITIN) 10 MG tablet Take 1 tablet by mouth daily.     losartan-hydrochlorothiazide (HYZAAR) 50-12.5 MG tablet Take 1 tablet by mouth daily. 90 tablet 3   lubiprostone (AMITIZA) 8 MCG capsule Take 1 capsule (8 mcg total) by mouth 2 (two) times daily with a meal. 60 capsule 5   Magnesium 200 MG TABS Take 1 tablet (200 mg total) by mouth daily. 30 tablet 6   montelukast (SINGULAIR) 10 MG  tablet Take 10 mg by mouth daily.     mupirocin ointment (BACTROBAN) 2 % Apply 1 application topically 2 (two) times daily. (Patient taking differently: Apply 1 application  topically 2 (two) times daily as needed.) 30 g 1   nitroGLYCERIN (NITROSTAT) 0.4 MG SL tablet PLACE 1 TAB UNDER TONGUE EVERY 5 MINS AS NEEDED FOR CHEST PAIN - MAX 3 DOSES THEN 911 25 tablet 2   nortriptyline (PAMELOR) 10 MG capsule Take 1 capsule (10 mg total) by mouth at bedtime. 30 capsule 5   pantoprazole (PROTONIX) 40 MG tablet TAKE 1 TABLET BY MOUTH EVERY DAY (Patient taking differently: Take 40 mg by mouth daily.) 30 tablet 11   polyethylene glycol (MIRALAX / GLYCOLAX) 17 g packet Take 17 g by mouth daily as needed for moderate constipation or severe constipation.     potassium chloride (KLOR-CON) 10 MEQ tablet Take 10 mEq by mouth daily.     propranolol ER (INDERAL LA) 60 MG 24 hr capsule Take 1 capsule (60 mg total) by mouth every evening. 90 capsule 3   rosuvastatin (CRESTOR) 40 MG tablet Take 1 tablet (40 mg total) by mouth daily. 90 tablet 3   SUMAtriptan (IMITREX) 100 MG tablet Take 100 mg by mouth every 2 (two) hours as needed for migraine.      temazepam (RESTORIL) 30 MG capsule Take 30 mg by mouth at bedtime as needed for sleep.     traMADol (ULTRAM) 50 MG tablet Take 50 mg by mouth 3 (three) times daily as needed.     No current facility-administered medications for this visit.    REVIEW OF SYSTEMS (negative unless checked):   Cardiac:  []  Chest pain or chest pressure? []  Shortness of breath upon activity? []  Shortness of breath when lying flat? []  Irregular heart rhythm?  Vascular:  [x]  Pain in calf, thigh, or hip brought on by walking? [x]  Pain in feet at night that wakes you up from your sleep? []  Blood clot in your veins? []  Leg swelling?  Pulmonary:  []  Oxygen at home? []  Productive cough? []  Wheezing?  Neurologic:  []  Sudden weakness in arms or legs? []  Sudden numbness in arms or  legs? []  Sudden onset of difficult speaking or slurred speech? []  Temporary loss of vision in one eye? []  Problems with dizziness?  Gastrointestinal:  []  Blood in stool? []  Vomited blood?  Genitourinary:  []  Burning when urinating? []  Blood in urine?  Psychiatric:  []  Major depression  Hematologic:  []  Bleeding problems? []  Problems with blood clotting?  Dermatologic:  []   Rashes or ulcers?  Constitutional:  []  Fever or chills?  Ear/Nose/Throat:  []  Change in hearing? []  Nose bleeds? []  Sore throat?  Musculoskeletal:  []  Back pain? []  Joint pain? []  Muscle pain?   Physical Examination   Vitals:   09/27/22 1234  BP: 132/72  Pulse: 79  Resp: 14  Temp: 97.6 F (36.4 C)  TempSrc: Temporal  SpO2: 96%  Weight: 132 lb (59.9 kg)  Height: 5\' 4"  (1.626 m)   Body mass index is 22.66 kg/m.  General:  WDWN in NAD; vital signs documented above Gait: Not observed HENT: WNL, normocephalic Pulmonary: normal non-labored breathing , without rales, rhonchi,  wheezing Cardiac: regular HR, without murmurs without carotid bruit Abdomen: soft, NT, no masses Skin: without rashes Vascular Exam/Pulses: Palpable femoral pulses bilaterally.  Nonpalpable popliteal or pedal pulses bilaterally.  Monophasic right DP/PT Doppler signals.  Monophasic right PT Doppler signal. Extremities: without ischemic changes, without gangrene , without cellulitis; without open wounds;  Musculoskeletal: no muscle wasting or atrophy  Neurologic: A&O X 3;  No focal weakness or paresthesias are detected Psychiatric:  The pt has Normal affect.  Non-Invasive Vascular Imaging ABI (09/27/2022) R:  ABI: 0.71 (0.87),  PT: mono DP: mono TBI:  0.41 L:  ABI: 0.59 (1.07),  PT: mono DP: mono TBI: 0.31   LLE Bypass Duplex (09/27/2022) +-----------+--------+-----+--------+----------+--------+  LEFT      PSV cm/sRatioStenosisWaveform  Comments   +-----------+--------+-----+--------+----------+--------+  EIA Distal 132                  triphasic           +-----------+--------+-----+--------+----------+--------+  CFA Prox   142                  triphasic           +-----------+--------+-----+--------+----------+--------+  CFA Mid                                   graft     +-----------+--------+-----+--------+----------+--------+  CFA Distal                                graft     +-----------+--------+-----+--------+----------+--------+  SFA Prox                                  graft     +-----------+--------+-----+--------+----------+--------+  SFA Mid                                   graft     +-----------+--------+-----+--------+----------+--------+  SFA Distal                                graft     +-----------+--------+-----+--------+----------+--------+  POP Mid    22                   monophasic          +-----------+--------+-----+--------+----------+--------+  TP Trunk   26                   monophasic          +-----------+--------+-----+--------+----------+--------+  ATA Distal 18  monophasic          +-----------+--------+-----+--------+----------+--------+  PTA Distal 19                   monophasic          +-----------+--------+-----+--------+----------+--------+  PERO Distal                               NV        +-----------+--------+-----+--------+----------+--------+     Left Graft #1: +--------------------+--------+--------+---------+--------+                      PSV cm/sStenosisWaveform Comments  +--------------------+--------+--------+---------+--------+  Inflow             142             triphasic          +--------------------+--------+--------+---------+--------+  Proximal Anastomosis        occluded                    +--------------------+--------+--------+---------+--------+  Proximal Graft              occluded                   +--------------------+--------+--------+---------+--------+  Mid Graft                   occluded                   +--------------------+--------+--------+---------+--------+  Distal Graft                occluded                   +--------------------+--------+--------+---------+--------+  Distal Anastomosis          occluded                   +--------------------+--------+--------+---------+--------+  Outflow                                                 Medical Decision Making   [name] is a 67 y.o. female who presents for triage visit  Based on the patient's vascular studies, her ABIs have decreased bilaterally.  Her right ABI has decreased from 0.87 to 0.71.  Her left ABI has decreased from 1.07 to 0.59.  She has monophasic tibial flow bilaterally. Left lower extremity arterial duplex demonstrates an occluded left femoral to above-knee popliteal artery bypass.  There is monophasic flow in the left popliteal artery, TP trunk, AT and PT arteries. On exam she has palpable femoral pulses. She has monophasic right DP/PT Doppler signals.  She has a monophasic right PT Doppler signal. She describes worsening rest pain in the left lower extremity over the past 3 weeks.  She experiences almost nightly pain in her left foot and calf when laying down.  She also has bilateral calf claudication after walking about 0.25 miles.  Given that her left femoral to above-knee popliteal artery bypass is occluded, with rest pain and significant drop in ABIs, she will require intervention on her bypass in the near future for limb salvage. We will schedule the patient for abdominal aortogram with left lower extremity runoff and possible pharmacomechanical treatment of left femoropopliteal bypass graft occlusion on 10/01/2022 with  Dr. Myra Gianotti. The patient is  aware that if flow in her bypass cannot be restored she may require future redo left lower extremity bypass graft.   Loel Dubonnet PA-C [facility] Office: [phone]  Clinic MD: Karin Lieu

## 2022-09-27 NOTE — H&P (View-Only) (Signed)
  Office Note   History of Present Illness   [name] is a 66 y.o. (12/08/1955) female who presents as a triage visit.  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  This was done for lifestyle limiting claudication. Prior to bypass she had undergone atherectomy and angioplasty by [name]. She was last seen by our office in 2023 and at that time she was asymptomatic.  She returns today as a triage visit.  Over the past 3 weeks she has been having rest pain in her left lower extremity.  She states most nights she wakes up from her sleep with a lot of pain in her left foot and calf.  Sometimes the pain is so severe it feels as if it radiates to her right leg.  Usually she has to dangle her feet off the bed or stand up to get relief from her pain.  She has also recently had a change in jobs requiring a longer walk from her parking space to her office.  She endorses worsening claudication in bilateral calves.  She denies any tissue loss of the lower extremities.  She has previously been prescribed aspirin and Plavix.  She states that JenniferBerry told her that she did not have to take her Plavix anymore and only had to take aspirin.   [name] longer smokes cigarettes.  She does vape.   Current Outpatient Medications  Medication Sig Dispense Refill   acetaminophen (TYLENOL) 325 MG tablet Take 2 tablets (650 mg total) by mouth every 6 (six) hours as needed for mild pain (or Fever >/= 101). 12 tablet 2   albuterol (PROAIR HFA) 108 (90 Base) MCG/ACT inhaler 1-2 inhalations every 4-6 hours as needed for cough or wheeze. (Patient taking differently: Inhale 1-2 puffs into the lungs every 4 (four) hours as needed for shortness of breath.) 1 Inhaler 1   ALPRAZolam (XANAX) 0.5 MG tablet Take 0.5 mg by mouth 2 (two) times daily as needed for anxiety.     aspirin EC 81 MG tablet Take 1 tablet by mouth daily.     Azelastine HCl 0.15 %  SOLN Place 2 sprays into both nostrils 2 (two) times daily. (Patient taking differently: Place 2 sprays into both nostrils 2 (two) times daily as needed (allergies).) 30 mL 5   bisacodyl (DULCOLAX) 5 MG EC tablet Take 5 mg by mouth daily as needed for moderate constipation.     clopidogrel (PLAVIX) 75 MG tablet Take 1 tablet (75 mg total) by mouth daily. 30 tablet 2   Continuous Blood Gluc Receiver (DEXCOM G6 RECEIVER) DEVI by Does not apply route.     cyclobenzaprine (FLEXERIL) 10 MG tablet Take 1 tablet by mouth as needed.     doxycycline (VIBRAMYCIN) 100 MG capsule Take 1 capsule (100 mg total) by mouth 2 (two) times daily. (Patient taking differently: Take 100 mg by mouth as needed.) 14 capsule 0   empagliflozin (JARDIANCE) 25 MG TABS tablet Take 1 tablet (25 mg total) by mouth daily before breakfast. 90 tablet 3   EPINEPHrine 0.3 mg/0.3 mL IJ SOAJ injection Inject 0.3 mLs (0.3 mg total) into the muscle once. 1 Device 1   fluconazole (DIFLUCAN) 150 MG tablet Take 1 tablet by mouth as needed.     Fluocinolone Acetonide Body 0.01 % OIL Apply topically 2 (two) times daily.     fluticasone (FLONASE) 50 MCG/ACT nasal spray SPRAY 2   SPRAYS INTO EACH NOSTRIL EVERY DAY     Insulin Disposable Pump (OMNIPOD 5 G6 POD, GEN 5,) MISC 1 Device by Other route every 3 (three) days. Change every 72 hours 6 each 3   insulin lispro (HUMALOG) 100 UNIT/ML injection Max daily 60 units 60 mL 3   levocetirizine (XYZAL) 5 MG tablet Take 5 mg by mouth daily.     loperamide (IMODIUM) 2 MG capsule Take 2 mg by mouth daily.     loratadine (CLARITIN) 10 MG tablet Take 1 tablet by mouth daily.     losartan-hydrochlorothiazide (HYZAAR) 50-12.5 MG tablet Take 1 tablet by mouth daily. 90 tablet 3   lubiprostone (AMITIZA) 8 MCG capsule Take 1 capsule (8 mcg total) by mouth 2 (two) times daily with a meal. 60 capsule 5   Magnesium 200 MG TABS Take 1 tablet (200 mg total) by mouth daily. 30 tablet 6   montelukast (SINGULAIR) 10 MG  tablet Take 10 mg by mouth daily.     mupirocin ointment (BACTROBAN) 2 % Apply 1 application topically 2 (two) times daily. (Patient taking differently: Apply 1 application  topically 2 (two) times daily as needed.) 30 g 1   nitroGLYCERIN (NITROSTAT) 0.4 MG SL tablet PLACE 1 TAB UNDER TONGUE EVERY 5 MINS AS NEEDED FOR CHEST PAIN - MAX 3 DOSES THEN 911 25 tablet 2   nortriptyline (PAMELOR) 10 MG capsule Take 1 capsule (10 mg total) by mouth at bedtime. 30 capsule 5   pantoprazole (PROTONIX) 40 MG tablet TAKE 1 TABLET BY MOUTH EVERY DAY (Patient taking differently: Take 40 mg by mouth daily.) 30 tablet 11   polyethylene glycol (MIRALAX / GLYCOLAX) 17 g packet Take 17 g by mouth daily as needed for moderate constipation or severe constipation.     potassium chloride (KLOR-CON) 10 MEQ tablet Take 10 mEq by mouth daily.     propranolol ER (INDERAL LA) 60 MG 24 hr capsule Take 1 capsule (60 mg total) by mouth every evening. 90 capsule 3   rosuvastatin (CRESTOR) 40 MG tablet Take 1 tablet (40 mg total) by mouth daily. 90 tablet 3   SUMAtriptan (IMITREX) 100 MG tablet Take 100 mg by mouth every 2 (two) hours as needed for migraine.      temazepam (RESTORIL) 30 MG capsule Take 30 mg by mouth at bedtime as needed for sleep.     traMADol (ULTRAM) 50 MG tablet Take 50 mg by mouth 3 (three) times daily as needed.     [name] current facility-administered medications for this visit.    REVIEW OF SYSTEMS (negative unless checked):   Cardiac:  [] Chest pain or chest pressure? [] Shortness of breath upon activity? [] Shortness of breath when lying flat? [] Irregular heart rhythm?  Vascular:  [x] Pain in calf, thigh, or hip brought on by walking? [x] Pain in feet at night that wakes you up from your sleep? [] Blood clot in your veins? [] Leg swelling?  Pulmonary:  [] Oxygen at home? [] Productive cough? [] Wheezing?  Neurologic:  [] Sudden weakness in arms or legs? [] Sudden numbness in arms or  legs? [] Sudden onset of difficult speaking or slurred speech? [] Temporary loss of vision in one eye? [] Problems with dizziness?  Gastrointestinal:  [] Blood in stool? [] Vomited blood?  Genitourinary:  [] Burning when urinating? [] Blood in urine?  Psychiatric:  [] Major depression  Hematologic:  [] Bleeding problems? [] Problems with blood clotting?  Dermatologic:  []   Rashes or ulcers?  Constitutional:  [] Fever or chills?  Ear/Nose/Throat:  [] Change in hearing? [] Nose bleeds? [] Sore throat?  Musculoskeletal:  [] Back pain? [] Joint pain? [] Muscle pain?   Physical Examination   Vitals:   09/27/22 1234  BP: 132/72  Pulse: 79  Resp: 14  Temp: 97.6 F (36.4 C)  TempSrc: Temporal  SpO2: 96%  Weight: 132 lb (59.9 kg)  Height: 5' 4" (1.626 m)   Body mass index is 22.66 kg/m.  General:  WDWN in NAD; vital signs documented above Gait: Not observed HENT: WNL, normocephalic Pulmonary: normal non-labored breathing , without rales, rhonchi,  wheezing Cardiac: regular HR, without murmurs without carotid bruit Abdomen: soft, NT, [name] masses Skin: without rashes Vascular Exam/Pulses: Palpable femoral pulses bilaterally.  Nonpalpable popliteal or pedal pulses bilaterally.  Monophasic right DP/PT Doppler signals.  Monophasic right PT Doppler signal. Extremities: without ischemic changes, without gangrene , without cellulitis; without open wounds;  Musculoskeletal: [name] muscle wasting or atrophy  Neurologic: A&O X 3;  [name] focal weakness or paresthesias are detected Psychiatric:  The pt has Normal affect.  Non-Invasive Vascular Imaging ABI (09/27/2022) R:  ABI: 0.71 (0.87),  PT: mono DP: mono TBI:  0.41 L:  ABI: 0.59 (1.07),  PT: mono DP: mono TBI: 0.31   LLE Bypass Duplex (09/27/2022) +-----------+--------+-----+--------+----------+--------+  LEFT      PSV cm/sRatioStenosisWaveform  Comments   +-----------+--------+-----+--------+----------+--------+  EIA Distal 132                  triphasic           +-----------+--------+-----+--------+----------+--------+  CFA Prox   142                  triphasic           +-----------+--------+-----+--------+----------+--------+  CFA Mid                                   graft     +-----------+--------+-----+--------+----------+--------+  CFA Distal                                graft     +-----------+--------+-----+--------+----------+--------+  SFA Prox                                  graft     +-----------+--------+-----+--------+----------+--------+  SFA Mid                                   graft     +-----------+--------+-----+--------+----------+--------+  SFA Distal                                graft     +-----------+--------+-----+--------+----------+--------+  POP Mid    22                   monophasic          +-----------+--------+-----+--------+----------+--------+  TP Trunk   26                   monophasic          +-----------+--------+-----+--------+----------+--------+  ATA Distal 18                     monophasic          +-----------+--------+-----+--------+----------+--------+  PTA Distal 19                   monophasic          +-----------+--------+-----+--------+----------+--------+  PERO Distal                               NV        +-----------+--------+-----+--------+----------+--------+     Left Graft #1: +--------------------+--------+--------+---------+--------+                      PSV cm/sStenosisWaveform Comments  +--------------------+--------+--------+---------+--------+  Inflow             142             triphasic          +--------------------+--------+--------+---------+--------+  Proximal Anastomosis        occluded                    +--------------------+--------+--------+---------+--------+  Proximal Graft              occluded                   +--------------------+--------+--------+---------+--------+  Mid Graft                   occluded                   +--------------------+--------+--------+---------+--------+  Distal Graft                occluded                   +--------------------+--------+--------+---------+--------+  Distal Anastomosis          occluded                   +--------------------+--------+--------+---------+--------+  Outflow                                                 Medical Decision Making   [name] is a 66 y.o. female who presents for triage visit  Based on the patient's vascular studies, her ABIs have decreased bilaterally.  Her right ABI has decreased from 0.87 to 0.71.  Her left ABI has decreased from 1.07 to 0.59.  She has monophasic tibial flow bilaterally. Left lower extremity arterial duplex demonstrates an occluded left femoral to above-knee popliteal artery bypass.  There is monophasic flow in the left popliteal artery, TP trunk, AT and PT arteries. On exam she has palpable femoral pulses. She has monophasic right DP/PT Doppler signals.  She has a monophasic right PT Doppler signal. She describes worsening rest pain in the left lower extremity over the past 3 weeks.  She experiences almost nightly pain in her left foot and calf when laying down.  She also has bilateral calf claudication after walking about 0.25 miles.  Given that her left femoral to above-knee popliteal artery bypass is occluded, with rest pain and significant drop in ABIs, she will require intervention on her bypass in the near future for limb salvage. We will schedule the patient for abdominal aortogram with left lower extremity runoff and possible pharmacomechanical treatment of left femoropopliteal bypass graft occlusion on 10/01/2022 with   [name]. The patient is  aware that if flow in her bypass cannot be restored she may require future redo left lower extremity bypass graft.   Jennifer Berti PA-C [facility] Office: [phone]  Clinic MD: [place] 

## 2022-10-01 ENCOUNTER — Other Ambulatory Visit: Payer: Self-pay

## 2022-10-01 ENCOUNTER — Ambulatory Visit (HOSPITAL_COMMUNITY)
Admission: RE | Admit: 2022-10-01 | Discharge: 2022-10-01 | Disposition: A | Discharge status: Home / Self Care | Payer: 59 | Attending: Surgery | Admitting: Surgery

## 2022-10-01 ENCOUNTER — Encounter (HOSPITAL_COMMUNITY)
Admission: RE | Disposition: A | Discharge status: Home / Self Care | Payer: Self-pay | Source: Home / Self Care | Attending: Surgery

## 2022-10-01 DIAGNOSIS — Z7982 Long term (current) use of aspirin: Secondary | ICD-10-CM | POA: Insufficient documentation

## 2022-10-01 DIAGNOSIS — Y832 Surgical operation with anastomosis, bypass or graft as the cause of abnormal reaction of the patient, or of later complication, without mention of misadventure at the time of the procedure: Secondary | ICD-10-CM | POA: Insufficient documentation

## 2022-10-01 DIAGNOSIS — Z95820 Peripheral vascular angioplasty status with implants and grafts: Secondary | ICD-10-CM

## 2022-10-01 DIAGNOSIS — F17290 Nicotine dependence, other tobacco product, uncomplicated: Secondary | ICD-10-CM | POA: Diagnosis not present

## 2022-10-01 DIAGNOSIS — I70222 Atherosclerosis of native arteries of extremities with rest pain, left leg: Secondary | ICD-10-CM | POA: Insufficient documentation

## 2022-10-01 DIAGNOSIS — T82868A Thrombosis of vascular prosthetic devices, implants and grafts, initial encounter: Secondary | ICD-10-CM | POA: Diagnosis not present

## 2022-10-01 DIAGNOSIS — T82858A Stenosis of vascular prosthetic devices, implants and grafts, initial encounter: Secondary | ICD-10-CM | POA: Diagnosis present

## 2022-10-01 DIAGNOSIS — T82898A Other specified complication of vascular prosthetic devices, implants and grafts, initial encounter: Secondary | ICD-10-CM

## 2022-10-01 HISTORY — PX: PERIPHERAL VASCULAR INTERVENTION: CATH118257

## 2022-10-01 HISTORY — PX: ABDOMINAL AORTOGRAM W/LOWER EXTREMITY: CATH118223

## 2022-10-01 HISTORY — PX: PERIPHERAL VASCULAR THROMBECTOMY: CATH118306

## 2022-10-01 LAB — GLUCOSE, CAPILLARY
Glucose-Capillary: 178 mg/dL — ABNORMAL HIGH (ref 70–99)
Glucose-Capillary: 153 mg/dL — ABNORMAL HIGH (ref 70–99)
Glucose-Capillary: 139 mg/dL — ABNORMAL HIGH (ref 70–99)

## 2022-10-01 LAB — POCT ACTIVATED CLOTTING TIME
Activated Clotting Time: 244 seconds
Activated Clotting Time: 168 seconds

## 2022-10-01 LAB — POCT I-STAT, CHEM 8
BUN: 20 mg/dL (ref 8–23)
Calcium, Ion: 1.35 mmol/L (ref 1.15–1.40)
Chloride: 104 mmol/L (ref 98–111)
Creatinine, Ser: 0.9 mg/dL (ref 0.44–1.00)
Glucose, Bld: 168 mg/dL — ABNORMAL HIGH (ref 70–99)
HCT: 42 % (ref 36.0–46.0)
Hemoglobin: 14.3 g/dL (ref 12.0–15.0)
Potassium: 4.5 mmol/L (ref 3.5–5.1)
Sodium: 142 mmol/L (ref 135–145)
TCO2: 27 mmol/L (ref 22–32)

## 2022-10-01 SURGERY — ABDOMINAL AORTOGRAM W/LOWER EXTREMITY
Anesthesia: LOCAL | Laterality: Left

## 2022-10-01 MED ORDER — LABETALOL HCL 5 MG/ML IV SOLN: 10.0000 mg | INTRAVENOUS | Status: DC | PRN

## 2022-10-01 MED ORDER — ONDANSETRON HCL 4 MG/2ML IJ SOLN: 4.0000 mg | Freq: Four times a day (QID) | INTRAMUSCULAR | Status: DC | PRN

## 2022-10-01 MED ORDER — MIDAZOLAM HCL 2 MG/2ML IJ SOLN
INTRAMUSCULAR | Status: DC | PRN
  Administered 2022-10-01 (×2): 1 mg via INTRAVENOUS

## 2022-10-01 MED ORDER — IODIXANOL 320 MG/ML IV SOLN
INTRAVENOUS | Status: DC | PRN
  Administered 2022-10-01: 130 mL

## 2022-10-01 MED ORDER — MIDAZOLAM HCL 2 MG/2ML IJ SOLN
INTRAMUSCULAR | Status: AC
  Filled 2022-10-01: qty 2

## 2022-10-01 MED ORDER — PROTAMINE SULFATE 10 MG/ML IV SOLN
INTRAVENOUS | Status: AC
  Filled 2022-10-01: qty 5

## 2022-10-01 MED ORDER — ASPIRIN 81 MG PO TBEC: 81.0000 mg | DELAYED_RELEASE_TABLET | Freq: Every day | ORAL | Status: DC

## 2022-10-01 MED ORDER — OXYCODONE HCL 5 MG PO TABS: 5.0000 mg | ORAL_TABLET | ORAL | Status: DC | PRN

## 2022-10-01 MED ORDER — DIPHENHYDRAMINE HCL 50 MG/ML IJ SOLN
INTRAMUSCULAR | Status: AC
  Filled 2022-10-01: qty 1

## 2022-10-01 MED ORDER — SODIUM CHLORIDE 0.9 % WEIGHT BASED INFUSION: 1.0000 mL/kg/h | INTRAVENOUS | Status: DC

## 2022-10-01 MED ORDER — CLOPIDOGREL BISULFATE 300 MG PO TABS
ORAL_TABLET | ORAL | Status: DC | PRN
  Administered 2022-10-01: 300 mg via ORAL

## 2022-10-01 MED ORDER — ASPIRIN 81 MG PO CHEW
CHEWABLE_TABLET | ORAL | Status: DC | PRN
  Administered 2022-10-01: 81 mg via ORAL

## 2022-10-01 MED ORDER — ACETAMINOPHEN 325 MG PO TABS: 650.0000 mg | ORAL_TABLET | ORAL | Status: DC | PRN

## 2022-10-01 MED ORDER — CLOPIDOGREL BISULFATE 300 MG PO TABS
ORAL_TABLET | ORAL | Status: AC
  Filled 2022-10-01: qty 1

## 2022-10-01 MED ORDER — HEPARIN SODIUM (PORCINE) 1000 UNIT/ML IJ SOLN
INTRAMUSCULAR | Status: AC
  Filled 2022-10-01: qty 10

## 2022-10-01 MED ORDER — PROTAMINE SULFATE 10 MG/ML IV SOLN
INTRAVENOUS | Status: DC | PRN
  Administered 2022-10-01: 20 mg via INTRAVENOUS
  Administered 2022-10-01: 5 mg via INTRAVENOUS

## 2022-10-01 MED ORDER — CLOPIDOGREL BISULFATE 75 MG PO TABS: 75.0000 mg | ORAL_TABLET | Freq: Every day | ORAL | Status: DC

## 2022-10-01 MED ORDER — METHYLPREDNISOLONE SODIUM SUCC 125 MG IJ SOLR
125.0000 mg | Freq: Once | INTRAMUSCULAR | Status: AC
  Administered 2022-10-01: 125 mg via INTRAVENOUS

## 2022-10-01 MED ORDER — HYDRALAZINE HCL 20 MG/ML IJ SOLN: 5.0000 mg | INTRAMUSCULAR | Status: DC | PRN

## 2022-10-01 MED ORDER — LIDOCAINE HCL (PF) 1 % IJ SOLN
INTRAMUSCULAR | Status: AC
  Filled 2022-10-01: qty 30

## 2022-10-01 MED ORDER — CLOPIDOGREL BISULFATE 75 MG PO TABS: 75.0000 mg | ORAL_TABLET | Freq: Every day | ORAL | 11 refills | Status: DC

## 2022-10-01 MED ORDER — FENTANYL CITRATE (PF) 100 MCG/2ML IJ SOLN
INTRAMUSCULAR | Status: DC | PRN
  Administered 2022-10-01: 50 ug via INTRAVENOUS

## 2022-10-01 MED ORDER — HEPARIN SODIUM (PORCINE) 1000 UNIT/ML IJ SOLN
INTRAMUSCULAR | Status: DC | PRN
  Administered 2022-10-01: 6000 [IU] via INTRAVENOUS

## 2022-10-01 MED ORDER — METHYLPREDNISOLONE SODIUM SUCC 125 MG IJ SOLR
INTRAMUSCULAR | Status: AC
  Filled 2022-10-01: qty 2

## 2022-10-01 MED ORDER — HYDROMORPHONE HCL 1 MG/ML IJ SOLN: 0.5000 mg | INTRAMUSCULAR | Status: DC | PRN

## 2022-10-01 MED ORDER — LIDOCAINE HCL (PF) 1 % IJ SOLN
INTRAMUSCULAR | Status: DC | PRN
  Administered 2022-10-01: 15 mL

## 2022-10-01 MED ORDER — ASPIRIN 81 MG PO CHEW
CHEWABLE_TABLET | ORAL | Status: AC
  Filled 2022-10-01: qty 1

## 2022-10-01 MED ORDER — FAMOTIDINE IN NACL 20-0.9 MG/50ML-% IV SOLN
20.0000 mg | Freq: Once | INTRAVENOUS | Status: AC
  Administered 2022-10-01: 20 mg via INTRAVENOUS

## 2022-10-01 MED ORDER — SODIUM CHLORIDE 0.9 % IV SOLN: INTRAVENOUS | Status: DC | PRN

## 2022-10-01 MED ORDER — FAMOTIDINE IN NACL 20-0.9 MG/50ML-% IV SOLN
INTRAVENOUS | Status: AC
  Filled 2022-10-01: qty 50

## 2022-10-01 MED ORDER — DIPHENHYDRAMINE HCL 50 MG/ML IJ SOLN
25.0000 mg | Freq: Once | INTRAMUSCULAR | Status: AC
  Administered 2022-10-01: 25 mg via INTRAVENOUS

## 2022-10-01 MED ORDER — SODIUM CHLORIDE 0.9 % IV SOLN: INTRAVENOUS | Status: DC

## 2022-10-01 MED ORDER — CLOPIDOGREL BISULFATE 75 MG PO TABS: 300.0000 mg | ORAL_TABLET | Freq: Once | ORAL | Status: DC

## 2022-10-01 MED ORDER — HEPARIN (PORCINE) IN NACL 1000-0.9 UT/500ML-% IV SOLN
INTRAVENOUS | Status: DC | PRN
  Administered 2022-10-01 (×3): 500 mL

## 2022-10-01 MED ORDER — FENTANYL CITRATE (PF) 100 MCG/2ML IJ SOLN
INTRAMUSCULAR | Status: AC
  Filled 2022-10-01: qty 2

## 2022-10-01 SURGICAL SUPPLY — 24 items
BALLN STERLING OTW 5X100X135 (BALLOONS) ×1
BALLOON STERLING OTW 5X100X135 (BALLOONS) IMPLANT
CATH JETI 6FR 120 (CATHETERS) IMPLANT
CATH OMNI FLUSH 5F 65CM (CATHETERS) IMPLANT
CATH QUICKCROSS .035X135CM (MICROCATHETER) IMPLANT
GLIDEWIRE ADV .035X260CM (WIRE) IMPLANT
KIT ACCESSORY JETI (KITS) IMPLANT
KIT ENCORE 26 ADVANTAGE (KITS) IMPLANT
KIT ESSENTIALS PG (KITS) IMPLANT
KIT MICROPUNCTURE NIT STIFF (SHEATH) IMPLANT
KIT PV (KITS) ×1 IMPLANT
SHEATH CATAPULT 6FR 45 (SHEATH) IMPLANT
SHEATH PINNACLE 5F 10CM (SHEATH) IMPLANT
SHEATH PINNACLE 6F 10CM (SHEATH) IMPLANT
SHEATH PROBE COVER 6X72 (BAG) IMPLANT
STENT VIABAHN 6X100X120 (Permanent Stent) IMPLANT
STENT VIABAHN 6X150X120 (Permanent Stent) IMPLANT
STOPCOCK MORSE 400PSI 3WAY (MISCELLANEOUS) IMPLANT
SYR MEDRAD MARK 7 150ML (SYRINGE) ×1 IMPLANT
TRANSDUCER W/STOPCOCK (MISCELLANEOUS) ×1 IMPLANT
TRAY PV CATH (CUSTOM PROCEDURE TRAY) ×1 IMPLANT
TUBING CIL FLEX 10 FLL-RA (TUBING) IMPLANT
WIRE G V18X300CM (WIRE) IMPLANT
WIRE STARTER BENTSON 035X150 (WIRE) IMPLANT

## 2022-10-01 NOTE — Progress Notes (Signed)
Dr. Myra Gianotti paged; bedrest starts at 1715; per short stay, she needs to be ambulatory by 2030; Dr. Myra Gianotti is allowing this.

## 2022-10-01 NOTE — Interval H&P Note (Signed)
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  The various methods of treatment have been discussed with the patient and family. After consideration of risks, benefits and other options for treatment, the patient has consented to  Procedure(s): ABDOMINAL AORTOGRAM W/LOWER EXTREMITY (N/A) as a surgical intervention.  The patient's history has been reviewed, patient examined, no change in status, stable for surgery.  I have reviewed the patient's chart and labs.  Questions were answered to the patient's satisfaction.     Durene Cal

## 2022-10-01 NOTE — Progress Notes (Signed)
66F arterial sheath right groin removed.  Manual pressure held for 30 minutes.  Right groin site level 0 pre and post pull.  Right DP pulse doppler post sheath pull.  Bedrest started at 1715.  Gauze and Tegaderm applied to right groin.  Instructions reviewed with patient.

## 2022-10-01 NOTE — Progress Notes (Signed)
Pt ambulated to and from bathroom to void with no signs of oozing from right groin site  

## 2022-10-01 NOTE — Op Note (Signed)
Patient name: [name] MRN: [id number] DOB: Jan 05, 1956 Sex: female  10/01/2022 Pre-operative Diagnosis: Occluded left femoral-popliteal bypass graft Post-operative diagnosis:  Same Surgeon:  Durene Cal Procedure Performed:  1.  Ultrasound-guided access, right femoral artery  2.  Abdominal aortogram with bifemoral runoff  3.  Left leg angiogram  4.  Selective left leg angiogram with catheter in left popliteal artery  5.  Mechanical thrombectomy of left femoral-popliteal bypass graft  6.  Stent, left femoral-popliteal bypass graft  7.  Conscious sedation, 93 minutes   Indications: This is a 67 year old female who has previously undergone left femoral to above-knee popliteal bypass graft with PTFE.  She has been having left leg symptoms for approximately 3 weeks that not tolerable.  Ultrasound showed that her bypass graft was occluded.  She is here today for further evaluation and possible intervention  Procedure:  The patient was identified in the holding area and taken to room 8.  The patient was then placed supine on the table and prepped and draped in the usual sterile fashion.  A time out was called.  Conscious sedation was administered with the use of IV fentanyl and Versed under continuous physician and nurse monitoring.  Heart rate, blood pressure, and oxygen saturation were continuously monitored.  Total sedation time was 93 minutes.  Ultrasound was used to evaluate the right common femoral artery.  It was patent .  A digital ultrasound image was acquired.  A micropuncture needle was used to access the right common femoral artery under ultrasound guidance.  An 018 wire was advanced without resistance and a micropuncture sheath was placed.  The 018 wire was removed and a benson wire was placed.  The micropuncture sheath was exchanged for a 5 french sheath.  An omniflush catheter was advanced over the wire to the level of L-1.  An abdominal angiogram was obtained.  Next, using the  omniflush catheter and a benson wire, the aortic bifurcation was crossed and the catheter was placed into theleft external iliac artery and left runoff was obtained.  right femoral evaluation was obtained from retrograde injection  Findings:   Aortogram: No obvious renal artery stenosis.  The infrarenal abdominal aorta is heavily calcified but patent without significant stenosis.  There does appear to be a high-grade stenosis at the origin of the right common iliac artery.  The remaining iliac system on the right and left are widely patent.  There is moderate right femoral stenosis, 40-50%.  The left common femoral is widely patent  Right Lower Extremity: Only the common femoral and proximal profunda and superficial femoral artery are visualized.  There is approximately 40-50% common femoral stenosis.  The visualized portions of the profunda and superficial femoral artery are without stenosis.  Left Lower Extremity: The left common femoral and profundofemoral artery are widely patent.  The superficial femoral and the femoral-popliteal bypass graft are occluded.  There is reconstitution of the above-knee popliteal artery.  The posterior tibial is the dominant vessel across the ankle.  The anterior tibial artery does cross the ankle but is somewhat diminutive  Intervention: After the above images were acquired, the decision was made to proceed with intervention.  A 6 x 45 cm sheath was advanced into the left common femoral artery.  The patient was fully heparinized.  A Glidewire advantage was then navigated into the bypass graft.  I was ultimately able to get the Glidewire as well as a quick cross catheter into the popliteal artery.  A contrast  injection was performed at this level to better define the runoff and to confirm successful crossing of the occluded bypass graft.  Next, I  selected the 6 [place] Jeti catheter and performed mechanical thrombectomy of the bypass graft.  Multiple passes were made with  follow-up images in between.  Ultimately, I was able to get the majority the bypass graft open, however there was residual thrombus at the proximal and distal sites.  I felt that these would do best with stenting.  Therefore I placed a 6 x 15 Viabahn proximally and a 6 x 10 Viabahn distally.  Both were molded with a 5 mm balloon.  Completion imaging showed a widely patent bypass graft with no change in runoff.  I do not feel closure was appropriate based off cannulation of the right groin and the moderate stenosis.  Therefore the patient be taken holding it for sheath pull  Impression:  #1  Successful thrombectomy of left femoral-popliteal bypass graft.  There was residual thrombus after using the jeti device that required stenting.  A 615 Viabahn was placed at the proximal edge and a 610 Viabahn placed at the distal edge.  #2  Dominant runoff is via the posterior tibial artery, however a diminutive anterior tibial crosses the ankle  #3  High-grade ostial right common iliac stenosis    V. Durene Cal, M.D., [facility] [place] Office: [phone] Pager:  [phone]

## 2022-10-03 ENCOUNTER — Ambulatory Visit: Payer: Self-pay | Admitting: *Deleted

## 2022-10-03 ENCOUNTER — Encounter (HOSPITAL_COMMUNITY): Payer: Self-pay | Admitting: Surgery

## 2022-10-03 DIAGNOSIS — E1149 Type 2 diabetes mellitus with other diabetic neurological complication: Secondary | ICD-10-CM | POA: Diagnosis not present

## 2022-10-03 DIAGNOSIS — E1165 Type 2 diabetes mellitus with hyperglycemia: Secondary | ICD-10-CM | POA: Diagnosis not present

## 2022-10-03 NOTE — Patient Instructions (Addendum)
Visit Information  Thank you for taking time to visit with me today. Please don't hesitate to contact me if I can be of assistance to you.   Pcp follow up 10/04/22 at 1 pm   Carin Hock PA  [facility]  [address] [place] [address]  [phone]  Following are the goals we discussed today:   Goals Addressed             This Visit's Progress    [name] care coordination services   On track    Interventions Today    Flowsheet Row Most Recent Value  Chronic Disease   Chronic disease during today's visit Other  General Interventions   General Interventions Discussed/Reviewed General Interventions Reviewed, Doctor Visits, Communication with  Doctor Visits Discussed/Reviewed Doctor Visits Reviewed, PCP, Specialist  PCP/Specialist Visits Compliance with follow-up visit  Communication with PCP/Specialists  [She agreed and was assisted to obtain a new patient/hospital follow up appointment for 1 pm 10/04/22]  Exercise Interventions   Physical Activity Discussed/Reviewed Physical Activity Reviewed  Education Interventions   Education Provided Provided Education  Provided Verbal Education On Insurance Plans  Mental Health Interventions   Mental Health Discussed/Reviewed Mental Health Reviewed, Coping Strategies  Pharmacy Interventions   Pharmacy Dicussed/Reviewed Pharmacy Topics Reviewed, Affording Medications  Safety Interventions   Safety Discussed/Reviewed Safety Discussed, Home Safety              Our next appointment is by telephone on 10/10/22 at 2:45  Please call the care guide team at [phone] if you need to cancel or reschedule your appointment.   If you are experiencing a Mental Health or Behavioral Health Crisis or need someone to talk to, please call the Suicide and Crisis Lifeline: 988 call the [place] National Suicide Prevention Lifeline: 304-840-3049 or TTY: 205-442-3803 TTY 539-603-0422) to talk to a trained counselor call 1-800-273-TALK  (toll free, 24 hour hotline) go to [facility] [address] [place] [phone]) call 911   Patient verbalizes understanding of instructions and care plan provided today and agrees to view in MyChart. Active MyChart status and patient understanding of how to access instructions and care plan via MyChart confirmed with patient.     The patient has been provided with contact information for the care management team and has been advised to call with any health related questions or concerns.   Dermot Gremillion L. Noelle Penner, RN, BSN, CCM [facility] Care Management Community Coordinator Office number [phone]

## 2022-10-03 NOTE — Patient Outreach (Signed)
  Care Coordination   Follow Up Visit Note   10/03/2022 Name: ORLINDA LEY MRN: [id number] DOB: [birth year]  [name] is a 67 y.o. year old female who sees [place], Janyth Pupa, [place] for primary care. I spoke with  [name] by phone today.  What matters to the patients health and wellness today?  Follow up for bilateral lower extremity abdominal angiogram  after reporting cramping of legs "like charlie horses". Limping  to providers. History of  PAD (Peripheral artery disease)  She will be following up with Dr Myra Gianotti for Disability forms/letter for working on 11/04/22 She agreed and was assisted to obtain a new patient/hospital follow up appointment for 1 pm 10/04/22     Goals Addressed             This Visit's Progress    [name] care coordination services   On track    Interventions Today    Flowsheet Row Most Recent Value  Chronic Disease   Chronic disease during today's visit Other  General Interventions   General Interventions Discussed/Reviewed General Interventions Reviewed, Doctor Visits, Communication with  Doctor Visits Discussed/Reviewed Doctor Visits Reviewed, PCP, Specialist  PCP/Specialist Visits Compliance with follow-up visit  Communication with PCP/Specialists  [She agreed and was assisted to obtain a new patient/hospital follow up appointment for 1 pm 10/04/22]  Exercise Interventions   Physical Activity Discussed/Reviewed Physical Activity Reviewed  Education Interventions   Education Provided Provided Education  Provided Verbal Education On Insurance Plans  Mental Health Interventions   Mental Health Discussed/Reviewed Mental Health Reviewed, Coping Strategies  Pharmacy Interventions   Pharmacy Dicussed/Reviewed Pharmacy Topics Reviewed, Affording Medications  Safety Interventions   Safety Discussed/Reviewed Safety Discussed, Home Safety              SDOH assessments and interventions completed:  No     Care Coordination Interventions:   Yes, provided   Follow up plan: Follow up call scheduled for 10/10/22    Encounter Outcome:  Pt. Visit Completed   Ariba Lehnen L. Noelle Penner, RN, BSN, CCM [facility] Care Management Community Coordinator Office number [phone]

## 2022-10-04 DIAGNOSIS — E7800 Pure hypercholesterolemia, unspecified: Secondary | ICD-10-CM | POA: Diagnosis not present

## 2022-10-04 DIAGNOSIS — I10 Essential (primary) hypertension: Secondary | ICD-10-CM | POA: Diagnosis not present

## 2022-10-04 DIAGNOSIS — Z794 Long term (current) use of insulin: Secondary | ICD-10-CM | POA: Diagnosis not present

## 2022-10-04 DIAGNOSIS — I25118 Atherosclerotic heart disease of native coronary artery with other forms of angina pectoris: Secondary | ICD-10-CM | POA: Diagnosis not present

## 2022-10-04 DIAGNOSIS — Z8673 Personal history of transient ischemic attack (TIA), and cerebral infarction without residual deficits: Secondary | ICD-10-CM | POA: Diagnosis not present

## 2022-10-04 DIAGNOSIS — R110 Nausea: Secondary | ICD-10-CM | POA: Diagnosis not present

## 2022-10-04 DIAGNOSIS — Z9889 Other specified postprocedural states: Secondary | ICD-10-CM | POA: Diagnosis not present

## 2022-10-04 DIAGNOSIS — E1169 Type 2 diabetes mellitus with other specified complication: Secondary | ICD-10-CM | POA: Diagnosis not present

## 2022-10-04 DIAGNOSIS — I739 Peripheral vascular disease, unspecified: Secondary | ICD-10-CM | POA: Diagnosis not present

## 2022-10-04 DIAGNOSIS — Z87898 Personal history of other specified conditions: Secondary | ICD-10-CM | POA: Diagnosis not present

## 2022-10-04 DIAGNOSIS — Z87891 Personal history of nicotine dependence: Secondary | ICD-10-CM | POA: Diagnosis not present

## 2022-10-07 ENCOUNTER — Telehealth: Payer: Self-pay

## 2022-10-07 NOTE — Telephone Encounter (Signed)
Spoke to pt regarding her short term disability request that we received. Additionally, pt is asking for a letter to be written for her to move back to a first floor apartment. She stated her apt is willing to accommodate this with a letter from [place]. Per APP this is appropriate given multiple interventions on her leg. I have left this for her at the front desk.

## 2022-10-08 ENCOUNTER — Telehealth: Payer: Self-pay

## 2022-10-08 NOTE — Telephone Encounter (Signed)
I have spoken to the pt who has requested a return to work date of 10/21/22. MD has approved this and I wrote her a letter as she requested to provide her employer. Pt did not want this faxed and preferred to pick it up in person this week. No further questions/concerns at this time.

## 2022-10-10 ENCOUNTER — Ambulatory Visit: Payer: Self-pay | Admitting: *Deleted

## 2022-10-10 NOTE — Patient Outreach (Signed)
  Care Coordination   Follow Up Visit Note   12/16/2022 Late update entry for 10/10/22 Name: DAZAH LOSIER MRN: [id number] DOB: [birth year]  [name] is a 68 y.o. year old female who sees Carin Hock, [place] for primary care. I spoke with  [name] by phone today.  What matters to the patients health and wellness today?  Feel better but  having some nausea and believes it is related to her restart of Plavix recently by Dr Myra Gianotti. She is also on a low dose Aspirin.  Patient states she will remain with NP. D Carin Hock, [place] as new pcp   Confirms she will be moving soon to a 1st floor apartment    Goals Addressed             This Visit's Progress    [name] care coordination services       Interventions Today    Flowsheet Row Most Recent Value  Chronic Disease   Chronic disease during today's visit --  [post thrombectomy RLE on old bypass site, new pcp, lower level apartment]  General Interventions   General Interventions Discussed/Reviewed Doctor Visits  Doctor Visits Discussed/Reviewed PCP  PCP/Specialist Visits Compliance with follow-up visit  Communication with PCP/Specialists              SDOH assessments and interventions completed:  No     Care Coordination Interventions:  Yes, provided   Follow up plan: Follow up call scheduled for 12/16/22    Encounter Outcome:  Pt. Visit Completed    L. Noelle Penner, RN, BSN, CCM [facility] Care Management Community Coordinator Office number [phone]

## 2022-10-10 NOTE — Patient Instructions (Addendum)
Visit Information  Thank you for taking time to visit with me today. Please don't hesitate to contact me if I can be of assistance to you.   Following are the goals we discussed today:   Goals Addressed             This Visit's Progress    [name] care coordination services       Interventions Today    Flowsheet Row Most Recent Value  Chronic Disease   Chronic disease during today's visit --  [post thrombectomy RLE on old bypass site, new pcp, lower level apartment]  General Interventions   General Interventions Discussed/Reviewed Doctor Visits  Doctor Visits Discussed/Reviewed PCP  PCP/Specialist Visits Compliance with follow-up visit  Communication with PCP/Specialists              Our next appointment is by telephone on 10/24/22 at  1  Please call the care guide team at [phone] if you need to cancel or reschedule your appointment.   If you are experiencing a Mental Health or Behavioral Health Crisis or need someone to talk to, please call the Suicide and Crisis Lifeline: 988 call the [place] National Suicide Prevention Lifeline: (825) 402-7942 or TTY: 808-560-9926 TTY (541)122-3814) to talk to a trained counselor call 1-800-273-TALK (toll free, 24 hour hotline) go to [facility] [address] [place] [phone]) call 911   Patient verbalizes understanding of instructions and care plan provided today and agrees to view in MyChart. Active MyChart status and patient understanding of how to access instructions and care plan via MyChart confirmed with patient.     The [facility] team will follow up with the patient and will provide direct communication to the PCP for this patient.    L. Noelle Penner, RN, BSN, CCM [facility] Care Management Community Coordinator Office number [phone]

## 2022-10-11 DIAGNOSIS — S81812A Laceration without foreign body, left lower leg, initial encounter: Secondary | ICD-10-CM | POA: Diagnosis not present

## 2022-10-15 DIAGNOSIS — Z0000 Encounter for general adult medical examination without abnormal findings: Secondary | ICD-10-CM | POA: Diagnosis not present

## 2022-10-21 ENCOUNTER — Other Ambulatory Visit: Payer: Self-pay | Admitting: Physician Assistant

## 2022-10-21 ENCOUNTER — Other Ambulatory Visit: Payer: Self-pay | Admitting: Internal Medicine

## 2022-10-21 DIAGNOSIS — Z122 Encounter for screening for malignant neoplasm of respiratory organs: Secondary | ICD-10-CM

## 2022-10-21 DIAGNOSIS — Z1231 Encounter for screening mammogram for malignant neoplasm of breast: Secondary | ICD-10-CM

## 2022-10-23 ENCOUNTER — Telehealth: Payer: Self-pay | Admitting: Nutrition

## 2022-10-23 NOTE — Telephone Encounter (Signed)
Message left on my machine.  "My blood sugars are going up and down"  Please advise LVM to call me back.  I am going on vacation now, so please contact patient

## 2022-10-24 ENCOUNTER — Other Ambulatory Visit: Payer: Self-pay | Admitting: *Deleted

## 2022-10-24 ENCOUNTER — Ambulatory Visit: Payer: 59 | Admitting: Dietician

## 2022-10-24 DIAGNOSIS — I70222 Atherosclerosis of native arteries of extremities with rest pain, left leg: Secondary | ICD-10-CM

## 2022-10-24 DIAGNOSIS — E1142 Type 2 diabetes mellitus with diabetic polyneuropathy: Secondary | ICD-10-CM

## 2022-10-24 DIAGNOSIS — T82898A Other specified complication of vascular prosthetic devices, implants and grafts, initial encounter: Secondary | ICD-10-CM

## 2022-10-24 DIAGNOSIS — I70213 Atherosclerosis of native arteries of extremities with intermittent claudication, bilateral legs: Secondary | ICD-10-CM

## 2022-10-24 NOTE — Telephone Encounter (Signed)
Patient will stop by so we can look at her phone and see her dexcom reading. I was not able assist getting it hooked up over phone.

## 2022-10-24 NOTE — Telephone Encounter (Signed)
Yes, please - I cannot see the Dexcom traces.

## 2022-10-24 NOTE — Progress Notes (Signed)
Time 1645-1700. Brief visit post MD appointment. Patient has been entering 20 grams into the [facility] section of her pump regardless of what she is eating.   She states that life has been very stressful with increased stress and increased problems with IBS. States that she is not eating enough and is losing weight. Discussed that by entering 20 each meal she is under dosing insulin some meals and overdosing on insulin at other meals.  Discussed the risks associated of hypoglycemia.  She states that she is concerned about this and is low occasionally at 5 am. Discussed importance of adequate nutrition and tips to increase.  I:C ration changed from 1:6 to 1:5 per MD.  Briefly reviewed carb counting and label reading.  Patient has the Calorie Brooke Dare app on her phone.  Patient able to verbalize.  Discussed importance of entering accurate carbohydrates into her phone.  Discussed importance of self care.  Patient to call for frequent lows or if questions.  Oran Rein, RD, LDN, CDCES

## 2022-10-24 NOTE — Telephone Encounter (Signed)
Patient would like for her Omnipod to be reviewed as her sugar have been high and low.  Would you like me to print out report for review.

## 2022-10-29 ENCOUNTER — Telehealth: Payer: Self-pay | Admitting: Dietician

## 2022-10-29 NOTE — Telephone Encounter (Signed)
Returned patient call.  She states that she had a low of 67 last night at 10 pm.  Treated with regular coke and 2 garlic twists and increased to 107.  Dinner 6-7 pm:  [employer] pasta with sausage (3/4 serving), 1 garlic twist, 8 oz regular coke.  She states that she entered 41 grams of carbs into her pump (my calculation for carbs was 109).   Packing after dinner - increased activity.  Unable to see Dexcom report.  Patient has connected to our office.  On tech support line through Dexcom to see if this can be fixed.  She reports lows 1% and very low <1% per Clarity report.  She is not able to verbalize frequency.    Discussed that she should call her MD if she is having consistent/frequent low blood glucose.  Oran Rein, RD, LDN, CDCES

## 2022-11-02 DIAGNOSIS — E1149 Type 2 diabetes mellitus with other diabetic neurological complication: Secondary | ICD-10-CM | POA: Diagnosis not present

## 2022-11-02 DIAGNOSIS — E1165 Type 2 diabetes mellitus with hyperglycemia: Secondary | ICD-10-CM | POA: Diagnosis not present

## 2022-11-04 ENCOUNTER — Ambulatory Visit (INDEPENDENT_AMBULATORY_CARE_PROVIDER_SITE_OTHER)
Admission: RE | Admit: 2022-11-04 | Discharge: 2022-11-04 | Disposition: A | Discharge status: Home / Self Care | Payer: 59 | Source: Ambulatory Visit | Attending: Surgery | Admitting: Surgery

## 2022-11-04 ENCOUNTER — Encounter: Payer: Self-pay | Admitting: Surgery

## 2022-11-04 ENCOUNTER — Ambulatory Visit (INDEPENDENT_AMBULATORY_CARE_PROVIDER_SITE_OTHER): Payer: 59 | Admitting: Surgery

## 2022-11-04 ENCOUNTER — Ambulatory Visit (HOSPITAL_COMMUNITY)
Admission: RE | Admit: 2022-11-04 | Discharge: 2022-11-04 | Disposition: A | Discharge status: Home / Self Care | Payer: 59 | Source: Ambulatory Visit | Attending: Surgery | Admitting: Surgery

## 2022-11-04 VITALS — BP 110/70 | HR 70 | Temp 97.7°F | Resp 20 | Ht 64.0 in | Wt 126.0 lb

## 2022-11-04 DIAGNOSIS — T82898A Other specified complication of vascular prosthetic devices, implants and grafts, initial encounter: Secondary | ICD-10-CM | POA: Insufficient documentation

## 2022-11-04 DIAGNOSIS — I70213 Atherosclerosis of native arteries of extremities with intermittent claudication, bilateral legs: Secondary | ICD-10-CM

## 2022-11-04 DIAGNOSIS — M25551 Pain in right hip: Secondary | ICD-10-CM | POA: Diagnosis not present

## 2022-11-04 DIAGNOSIS — M25552 Pain in left hip: Secondary | ICD-10-CM | POA: Diagnosis not present

## 2022-11-04 DIAGNOSIS — I70222 Atherosclerosis of native arteries of extremities with rest pain, left leg: Secondary | ICD-10-CM

## 2022-11-04 NOTE — Progress Notes (Signed)
[facility]  Patient name: MAREYA RANK MRN: [id number] DOB: December 26, 1955 Sex: female   REASON FOR VISIT:    Follow up  HISOTRY OF PRESENT ILLNESS:    [name] is a 67 y.o. female who is status post left femoral to above-knee popliteal artery bypass graft with 6 mm external ring PTFE on 06/19/2016 for claudication.  She had previously undergone percutaneous interventions by Dr. Allyson Sabal.  At the time of her operation, she was unable to tolerate her level of activity.  She came to the office in May 2024 with a 3-week history of leg pain.  She went to the Cath Lab on 10/01/2022 and had mechanical thrombectomy of her bypass graft performed.  There was residual thrombus in the proximal and distal portions of the graft after Jeti.  Therefore 6 mm Viabahn stents were placed at the proximal and distal edge of bypass graft.  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  Today she is here without left leg complaints.  She says that she is having some right leg claudication symptoms but these are tolerable.  She does not have any open wounds or rest pain.  The patient has a strong family history of cardiovascular disease, mainly cardiac in origin.  The patient has a history of stroke in the past with some mild left-sided symptoms.  She has a history of tobacco abuse, however she has stopped smoking.  She suffers and hypercholesterolemia which is managed with a statin.  She is medically managed for hypertension.  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   Allergy    Anemia    hx   Angioedema 07/10/2015   Anxiety    Arthritis    "neck, left hand" (09/14/2012)   Asthma    Cancer (HCC) 1985   ovarian, no treatment except surgery   Cataract    Complication of anesthesia    OCCASIONAL TROUBLE TURNING NECK TO RIGHT    Critical lower limb ischemia (HCC)    10/2014 s/p L SFA stenting   Diverticulosis of colon with hemorrhage April 2013   GERD (gastroesophageal reflux disease)    GI bleed    H/O hiatal hernia    Heart attack (HCC)    2003 mild MI, March 2013 mild MI   Hidradenitis    groin   History of blood transfusion 1985 AND 2013   Hyperlipidemia    Hypertension    Irritable bowel syndrome    Left-sided weakness    since stroke, left eye trouble seeing   Migraines    Mild CAD    a. Cath 09/2010: mild luminal irregularities of LAD, 30% prox RCA and 20-30% mRCA, EF 65%.   Neuropathy    Obesity    Osteoporosis    PAD (peripheral artery disease) (HCC)    a. critical limb ischemia s/p PTA/stenting of L SFA 10/2014. c. occ prior SFA stent by angio 01/2016, for possible PV bypass.   Pneumonia    baby   Recurrent upper respiratory infection (URI)    S/P arterial stent-mid Lt SFA 11/03/14 11/04/2014   Schatzki's ring    Sinus problem    Stomach ulcer 1972   non-bleeding   Stroke (HCC) 07-2007, 07-2008, 07-2009   total 3 strokes; mild left sided weakness and left eye "jumps".   Type II diabetes mellitus (HCC)  Vitamin B 12 deficiency 07-15-2013     FAMILY HISTORY:   Family History  Problem Relation Age of Onset   Breast cancer Mother    Heart disease Mother    Hypertension Mother    Diabetes Father    Heart disease Father    Stroke Father    Heart disease Sister    Hypertension Sister    Hypertension Sister    Hyperlipidemia Sister    Diabetes Sister    Allergic rhinitis Sister    Emphysema Other        great uncle   Aneurysm Sister        brain   Colon cancer Paternal Uncle    Angioedema Neg Hx    Asthma Neg Hx    Eczema Neg Hx    Immunodeficiency Neg Hx    Urticaria Neg Hx     SOCIAL HISTORY:   Social History   Tobacco Use   Smoking status: Every Day    Packs/day: 0.50    Years: 41.00    Additional pack years: 0.00    Total pack years: 20.50    Types: Cigarettes     Last attempt to quit: 03/01/2020    Years since quitting: 2.6   Smokeless tobacco: Never   Tobacco comments:    Getting ready to start nicotine patches RX by Dr. Allyson Sabal per pt.  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 12 tablet 2   albuterol (PROAIR HFA) 108 (90 Base) MCG/ACT inhaler 1-2 inhalations every 4-6 hours as needed for cough or wheeze. (Patient taking differently: Inhale 1-2 puffs into the lungs every 4 (four) hours as needed for shortness of breath.) 1 Inhaler 1   ALPRAZolam (XANAX) 1 MG tablet Take 1 mg by mouth 2 (two) times daily as needed for anxiety.     amitriptyline (ELAVIL) 25 MG tablet Take 25 mg by mouth at bedtime as needed for sleep.     aspirin EC 81 MG tablet Take 81 mg by mouth daily.     Azelastine HCl 0.15 % SOLN Place 2 sprays into both nostrils 2 (two) times daily. (Patient taking differently: Place 2 sprays into both nostrils daily as needed (allergies).) 30 mL 5   bisacodyl (DULCOLAX) 5 MG EC tablet Take 5 mg by mouth daily as needed for moderate constipation.     ciprofloxacin (CIPRO) 500 MG tablet Take 500 mg by mouth 2 (two) times daily.     clopidogrel (PLAVIX) 75 MG tablet Take 1  tablet (75 mg total) by mouth daily. 30 tablet 11   Continuous Blood Gluc Receiver (DEXCOM G6 RECEIVER)  DEVI by Does not apply route.     doxycycline (VIBRAMYCIN) 100 MG capsule Take 1 capsule (100 mg total) by mouth 2 (two) times daily. (Patient taking differently: Take 100 mg by mouth as needed (Infection).) 14 capsule 0   empagliflozin (JARDIANCE) 25 MG TABS tablet Take 1 tablet (25 mg total) by mouth daily before breakfast. 90 tablet 3   EPINEPHrine 0.3 mg/0.3 mL IJ SOAJ injection Inject 0.3 mLs (0.3 mg total) into the muscle once. 1 Device 1   fluconazole (DIFLUCAN) 150 MG tablet Take 1 tablet by mouth as needed (yeast infection).     hydrocortisone (ANUSOL-HC) 2.5 % rectal cream Place rectally daily as needed for hemorrhoids.     Insulin Disposable Pump (OMNIPOD 5 G6 POD, GEN 5,) MISC 1 Device by Other route every 3 (three) days. Change every 72 hours 6 each 3   insulin lispro (HUMALOG) 100 UNIT/ML injection Max daily 60 units (Patient taking differently: every 3 (three) days. Max daily 100 units Omnipod) 60 mL 3   losartan-hydrochlorothiazide (HYZAAR) 50-12.5 MG tablet Take 1 tablet by mouth daily. 90 tablet 3   lubiprostone (AMITIZA) 8 MCG capsule Take 1 capsule (8 mcg total) by mouth 2 (two) times daily with a meal. 60 capsule 5   Magnesium 200 MG TABS Take 1 tablet (200 mg total) by mouth daily. (Patient taking differently: Take 200 mg by mouth 2 (two) times daily.) 30 tablet 6   montelukast (SINGULAIR) 10 MG tablet Take 10 mg by mouth at bedtime as needed (allergies).     mupirocin ointment (BACTROBAN) 2 % Apply 1 application topically 2 (two) times daily. (Patient taking differently: Apply 1 application  topically 2 (two) times daily as needed.) 30 g 1   nitroGLYCERIN (NITROSTAT) 0.4 MG SL tablet PLACE 1 TAB UNDER TONGUE EVERY 5 MINS AS NEEDED FOR CHEST PAIN - MAX 3 DOSES THEN 911 25 tablet 2   nortriptyline (PAMELOR) 10 MG capsule Take 1 capsule (10 mg total) by mouth at bedtime.  (Patient taking differently: Take 10 mg by mouth at bedtime as needed for sleep.) 30 capsule 5   pantoprazole (PROTONIX) 40 MG tablet TAKE 1 TABLET BY MOUTH EVERY DAY (Patient taking differently: Take 40 mg by mouth daily.) 30 tablet 11   polyethylene glycol (MIRALAX / GLYCOLAX) 17 g packet Take 17 g by mouth daily as needed for moderate constipation or severe constipation.     potassium chloride (KLOR-CON) 10 MEQ tablet Take 10 mEq by mouth 2 (two) times daily.     rosuvastatin (CRESTOR) 40 MG tablet Take 1 tablet (40 mg total) by mouth daily. 90 tablet 3   SUMAtriptan (IMITREX) 100 MG tablet Take 100 mg by mouth every 2 (two) hours as needed for migraine.      temazepam (RESTORIL) 30 MG capsule Take 15 mg by mouth at bedtime as needed for sleep.     No current facility-administered medications for this visit.    REVIEW OF SYSTEMS:   [X]  denotes positive finding, [ ]  denotes negative finding Cardiac  Comments:  Chest pain or chest pressure:    Shortness of breath upon exertion:    Short of breath when lying flat:    Irregular heart rhythm:        Vascular    Pain in calf, thigh, or hip brought on by ambulation: x   Pain in feet at night that wakes you up from your sleep:     Blood clot in your veins:  Leg swelling:         Pulmonary    Oxygen at home:    Productive cough:     Wheezing:         Neurologic    Sudden weakness in arms or legs:     Sudden numbness in arms or legs:     Sudden onset of difficulty speaking or slurred speech:    Temporary loss of vision in one eye:     Problems with dizziness:         Gastrointestinal    Blood in stool:     Vomited blood:         Genitourinary    Burning when urinating:     Blood in urine:        Psychiatric    Major depression:         Hematologic    Bleeding problems:    Problems with blood clotting too easily:        Skin    Rashes or ulcers:        Constitutional    Fever or chills:      PHYSICAL EXAM:    Vitals:   11/04/22 1455  BP: 110/70  Pulse: 70  Resp: 20  Temp: 97.7 F (36.5 C)  SpO2: 95%  Weight: 126 lb (57.2 kg)  Height: 5\' 4"  (1.626 m)    GENERAL: The patient is a well-nourished female, in no acute distress. The vital signs are documented above. CARDIAC: There is a regular rate and rhythm.  VASCULAR: I had trouble palpating pedal pulses PULMONARY: Non-labored respirations ABDOMEN: Soft and non-tender with normal pitched bowel sounds.  MUSCULOSKELETAL: There are no major deformities or cyanosis. NEUROLOGIC: No focal weakness or paresthesias are detected. SKIN: There are no ulcers or rashes noted. PSYCHIATRIC: The patient has a normal affect.  STUDIES:   I have reviewed the following: ABI/TBIToday's ABIToday's TBIPrevious ABIPrevious TBI  +-------+-----------+-----------+------------+------------+  Right 0.59       absent     0.71        0.41          +-------+-----------+-----------+------------+------------+  Left  0.99       0.34       0.59        0.31          +-------+-----------+-----------+------------+------------+  Left toe pressure: 48 Right toe pressure: Absent   Left: Widely patent femoropopliteal bypass graft and stenting within the  bypass.    MEDICAL ISSUES:   PAD: The patient bypass graft is widely patent after mechanical thrombectomy.  She will return for surveillance imaging in 6 months.  She is now having early symptoms of right leg claudication.  She has a iliac stenosis that could be addressed however we are going to try to delay this until her symptoms progress.  I will reevaluate her again in 6 months    Charlena Cross, MD, FACS [facility] [phone] Pager [phone]

## 2022-11-05 LAB — VAS US ABI WITH/WO TBI
Left ABI: 0.99
Right ABI: 0.59

## 2022-11-11 ENCOUNTER — Ambulatory Visit: Payer: 59 | Admitting: Surgery

## 2022-11-11 ENCOUNTER — Encounter (HOSPITAL_COMMUNITY): Payer: 59

## 2022-11-16 ENCOUNTER — Other Ambulatory Visit: Payer: Self-pay

## 2022-11-16 DIAGNOSIS — I70213 Atherosclerosis of native arteries of extremities with intermittent claudication, bilateral legs: Secondary | ICD-10-CM

## 2022-11-16 DIAGNOSIS — I70222 Atherosclerosis of native arteries of extremities with rest pain, left leg: Secondary | ICD-10-CM

## 2022-11-29 ENCOUNTER — Other Ambulatory Visit: Payer: Self-pay

## 2022-11-29 ENCOUNTER — Ambulatory Visit (INDEPENDENT_AMBULATORY_CARE_PROVIDER_SITE_OTHER): Payer: 59 | Admitting: Internal Medicine

## 2022-11-29 ENCOUNTER — Encounter: Payer: Self-pay | Admitting: Internal Medicine

## 2022-11-29 VITALS — BP 130/72 | HR 82 | Ht 64.0 in | Wt 126.0 lb

## 2022-11-29 DIAGNOSIS — E1142 Type 2 diabetes mellitus with diabetic polyneuropathy: Secondary | ICD-10-CM | POA: Diagnosis not present

## 2022-11-29 DIAGNOSIS — E1159 Type 2 diabetes mellitus with other circulatory complications: Secondary | ICD-10-CM

## 2022-11-29 DIAGNOSIS — E785 Hyperlipidemia, unspecified: Secondary | ICD-10-CM

## 2022-11-29 DIAGNOSIS — Z4681 Encounter for fitting and adjustment of insulin pump: Secondary | ICD-10-CM

## 2022-11-29 DIAGNOSIS — R809 Proteinuria, unspecified: Secondary | ICD-10-CM | POA: Diagnosis not present

## 2022-11-29 DIAGNOSIS — E1129 Type 2 diabetes mellitus with other diabetic kidney complication: Secondary | ICD-10-CM

## 2022-11-29 DIAGNOSIS — E1165 Type 2 diabetes mellitus with hyperglycemia: Secondary | ICD-10-CM

## 2022-11-29 DIAGNOSIS — Z794 Long term (current) use of insulin: Secondary | ICD-10-CM

## 2022-11-29 LAB — MICROALBUMIN / CREATININE URINE RATIO
Creatinine,U: 40.8 mg/dL
Microalb Creat Ratio: 4.9 mg/g (ref 0.0–30.0)
Microalb, Ur: 2 mg/dL — ABNORMAL HIGH (ref 0.0–1.9)

## 2022-11-29 LAB — POCT GLYCOSYLATED HEMOGLOBIN (HGB A1C): Hemoglobin A1C: 6.4 % — AB (ref 4.0–5.6)

## 2022-11-29 MED ORDER — DEXCOM G7 SENSOR MISC: 1.0000 | 3 refills | Status: DC

## 2022-11-29 MED ORDER — OMNIPOD 5 G7 PODS (GEN 5) MISC: 1.0000 | 3 refills | Status: DC

## 2022-11-29 MED ORDER — INSULIN LISPRO 100 UNIT/ML IJ SOLN: INTRAMUSCULAR | 3 refills | Status: DC

## 2022-11-29 MED ORDER — OMNIPOD 5 G7 INTRO (GEN 5) KIT: 1.0000 | PACK | 0 refills | Status: DC

## 2022-11-29 NOTE — Progress Notes (Unsigned)
Name: [name]  Age/ Sex: 67 y.o., female   MRN/ DOB: [id number], 04-01-1956     PCP: Carin Hock, PA   Reason for Endocrinology Evaluation: Type 2 Diabetes Mellitus  Initial Endocrine Consultative Visit: 04/17/2020    PATIENT IDENTIFIER: [name] is a 67 y.o. female with a past medical history of T2DM, PAD ,CAD, HTN and Hx of pancreatitis  . The patient has followed with Endocrinology clinic since 04/17/2020 for consultative assistance with management of her diabetes.  DIABETIC HISTORY:  [name] was diagnosed with DM in 2003, She is intolerant to Metformin due to diarrhea and Soliqua due to itching . Her hemoglobin A1c has ranged from 7.1%  in 20, peaking at 9.5% in 2015  Has chronic GI issues - Dr Leone Payor   Islet Ab's and GAD-65 negative   Jardiance started 07/2020 Omnipod started 08/2020   SUBJECTIVE:   During the last visit (08/06/2022) :  A1c 7.4%   Today (11/29/2022): [name] is here for diabetes follow up .  She checks her blood sugars multiple times a day through CGM. The patient has had hypoglycemic episodes since the last clinic visit. She is symptomatic   She follows with Dr. Leone Payor for dyspepsia and IBS She is s/p left lower extremity mechanical thrombectomy, with hx  (fem-pop bypass)  She met with our CDE 10/2022 and it has been noted that she enters 20 g of CHO into the pump regardless of what she is?  She is having recurrent yeast infections , ~ 3x over the past few months    This patient with type 2 diabetes is treated with Omnipod (insulin pump). During the visit the pump basal and bolus doses were reviewed including carb/insulin rations and supplemental doses. The clinical list was updated. The glucose meter download was reviewed in detail to determine if the current pump settings are providing the best glycemic control without excessive hypoglycemia.     Pump and meter download:    Pump   Omnipod Settings   Insulin type    Humalog    Basal rate       0000  1.30 u/h    0400 1.45   0800 1.60   1200 1.80      I:C ratio       0000 1:5                  Sensitivity       0000  20      Goal       0000  120            Type & Model of Pump: Omnipod Insulin Type: Currently using Humalog .     PUMP STATISTICS: Average BG: 145 Average Daily Carbs (g):83.8 Average Total Daily Insulin:39.9 Average Daily Basal: 21.2(53 %) Average Daily Bolus: 18.7(47 %)   CONTINUOUS GLUCOSE MONITORING RECORD INTERPRETATION    Dates of Recording: 7/13-7/26/2024  Sensor description:dexcom  Results statistics:   CGM use % of time 90.9  Average and SD 145/45  Time in range  79%  % Time Above 180 17  % Time above 250 3  % Time Below target 1   Glycemic patterns summary: BG's are optimal overnight and during the day  Hyperglycemic episodes  postprandial   Hypoglycemic episodes occurred during the day  Overnight periods: Optimal   HOME DIABETES REGIMEN:  Humalog Jardiance 25 mg daily     Statin: yes ACE-I/ARB: yes  DIABETIC COMPLICATIONS: Microvascular complications:  Neuropathy  Denies: CKD, retinopathy Last Eye Exam: Completed 12/27/2021  Macrovascular complications:  PVD ( S/P femoral-popliteal bypass), Mild CAD  Denies: CAD, CVA   HISTORY:  Past Medical History:  Past Medical History:  Diagnosis Date   Allergic urticaria 07/10/2015   Allergy    Anemia    hx   Angioedema 07/10/2015   Anxiety    Arthritis    "neck, left hand" (09/14/2012)   Asthma    Cancer (HCC) 1985   ovarian, no treatment except surgery   Cataract    Complication of anesthesia    OCCASIONAL TROUBLE TURNING NECK TO RIGHT   Critical lower limb ischemia (HCC)    10/2014 s/p L SFA stenting   Diverticulosis of colon with hemorrhage April 2013   GERD (gastroesophageal reflux disease)    GI bleed    H/O hiatal hernia    Heart attack (HCC)    2003 mild MI, March 2013 mild MI   Hidradenitis    groin    History of blood transfusion 1985 AND 2013   Hyperlipidemia    Hypertension    Irritable bowel syndrome    Left-sided weakness    since stroke, left eye trouble seeing   Migraines    Mild CAD    a. Cath 09/2010: mild luminal irregularities of LAD, 30% prox RCA and 20-30% mRCA, EF 65%.   Neuropathy    Obesity    Osteoporosis    PAD (peripheral artery disease) (HCC)    a. critical limb ischemia s/p PTA/stenting of L SFA 10/2014. c. occ prior SFA stent by angio 01/2016, for possible PV bypass.   Pneumonia    baby   Recurrent upper respiratory infection (URI)    S/P arterial stent-mid Lt SFA 11/03/14 11/04/2014   Schatzki's ring    Sinus problem    Stomach ulcer 1972   non-bleeding   Stroke (HCC) 07-2007, 07-2008, 07-2009   total 3 strokes; mild left sided weakness and left eye "jumps".   Type II diabetes mellitus (HCC)    Vitamin B 12 deficiency 07-15-2013   Past Surgical History:  Past Surgical History:  Procedure Laterality Date   ABDOMINAL AORTOGRAM W/LOWER EXTREMITY Left 10/01/2022   Procedure: ABDOMINAL AORTOGRAM W/LOWER EXTREMITY;  Surgeon: Nada Libman, MD;  [facility];  Service: Cardiovascular;  Laterality: Left;   ABDOMINAL HYSTERECTOMY  1985   ADENOIDECTOMY     ANTERIOR CERVICAL DECOMP/DISCECTOMY FUSION  2002   ANTERIOR CERVICAL DECOMP/DISCECTOMY FUSION N/A 04/08/2014   Procedure: Cervical Six-Seven ANTERIOR CERVICAL DECOMPRESSION/DISCECTOMY FUSION Plating and Bonegraft  2 LEVELS;  Surgeon: Coletta Memos, MD;  Location: [facility];  Service: Neurosurgery;  Laterality: N/A;  Cervical Six-Seven ANTERIOR CERVICAL DECOMPRESSION/DISCECTOMY FUSION Plating and Bonegraft  2 LEVELS   APPENDECTOMY  1985   AXILLARY HIDRADENITIS EXCISION  1990-2008   bilateral   BACK SURGERY     BREAST BIOPSY Right 2007   BREAST CYST EXCISION Right 2008   BREAST REDUCTION SURGERY     CARDIAC CATHETERIZATION  2004   mild disease   CATARACT EXTRACTION W/PHACO Left 05/16/2015    Procedure: CATARACT EXTRACTION PHACO AND INTRAOCULAR LENS PLACEMENT (IOC);  Surgeon: Jethro Bolus, MD;  Location: [facility];  Service: Ophthalmology;  Laterality: Left;  CDE: 4.24   CATARACT EXTRACTION W/PHACO Right 05/30/2015   Procedure: CATARACT EXTRACTION RIGHT EYE PHACO AND INTRAOCULAR LENS PLACEMENT ;  Surgeon: Jethro Bolus, MD;  Location: [facility];  Service: Ophthalmology;  Laterality:  Right;  CDE:4.08   CHOLECYSTECTOMY  1990's   COLONOSCOPY  08/12/2011   Procedure: COLONOSCOPY;  Surgeon: Meryl Dare, MD,FACG;  Location: [facility] ENDOSCOPY;  Service: Endoscopy;  Laterality: N/A;   COLONOSCOPY  06/19/2006   COLONOSCOPY WITH PROPOFOL N/A 07/02/2019   Procedure: COLONOSCOPY WITH PROPOFOL;  Surgeon: Malissa Hippo, MD;  Location: [facility];  Service: Endoscopy;  Laterality: N/A;   cyst thigh Right    ESOPHAGOGASTRODUODENOSCOPY  08/12/2011   Procedure: ESOPHAGOGASTRODUODENOSCOPY (EGD);  Surgeon: Meryl Dare, MD,FACG;  Location: [facility] ENDOSCOPY;  Service: Endoscopy;  Laterality: N/A;   ESOPHAGOGASTRODUODENOSCOPY  06/04/2005   ESOPHAGOGASTRODUODENOSCOPY (EGD) WITH PROPOFOL N/A 07/01/2019   Procedure: ESOPHAGOGASTRODUODENOSCOPY (EGD) WITH PROPOFOL;  Surgeon: Malissa Hippo, MD;  [facility];  Service: Endoscopy;  Laterality: N/A;   FEMORAL-POPLITEAL BYPASS GRAFT Left 06/19/2016   Procedure: Left Leg BYPASS GRAFT FEMORAL-POPLITEAL ARTERY;  Surgeon: Nada Libman, MD;  Location: [facility] OR;  Service: Vascular;  Laterality: Left;   GIVENS CAPSULE STUDY  08/13/2011   Procedure: GIVENS CAPSULE STUDY;  Surgeon: Meryl Dare, MD,FACG;  Location: [facility] ENDOSCOPY;  Service: Endoscopy;  Laterality: N/A;   HAMMER TOE SURGERY Bilateral ~ 2000   HEMOSTASIS CLIP PLACEMENT  07/02/2019   Procedure: HEMOSTASIS CLIP PLACEMENT;  Surgeon: Malissa Hippo, MD;  [facility];  Service: Endoscopy;;  hepatic flexure   HIATAL HERNIA REPAIR     HYDRADENITIS EXCISION  01/2011; 03/2012   'groin and abdomen;  03/2012" (09/14/2012)   HYDRADENITIS EXCISION  04/01/2012   Procedure: EXCISION HYDRADENITIS GROIN;  Surgeon: Valarie Merino, MD;  Location: [facility] ORS;  Service: General;  Laterality: Bilateral;  Excision of Hydradenitis of Perineum   HYDRADENITIS EXCISION N/A 09/17/2013   Procedure: EXCISION PERINEAL HIDRADENITIS ;  Surgeon: Valarie Merino, MD;  Location: [facility] ORS;  Service: General;  Laterality: N/A;  also in the pubis area   LEFT HEART CATH AND CORONARY ANGIOGRAPHY N/A 12/26/2016   Procedure: LEFT HEART CATH AND CORONARY ANGIOGRAPHY;  Surgeon: Swaziland, Peter M, MD;  Location: [facility] INVASIVE CV LAB;  Service: Cardiovascular;  Laterality: N/A;   MASS EXCISION Right 09/17/2013   Procedure: EXCISION MASS;  Surgeon: Valarie Merino, MD;  Location: [facility] ORS;  Service: General;  Laterality: Right;   NISSEN FUNDOPLICATION  1996   PERIPHERAL VASCULAR CATHETERIZATION N/A 11/03/2014   Procedure: Lower Extremity Angiography;  Surgeon: Runell Gess, MD;  [facility];  Service: Cardiovascular;  Laterality: N/A;   PERIPHERAL VASCULAR CATHETERIZATION N/A 01/29/2016   Procedure: Lower Extremity Angiography;  Surgeon: Runell Gess, MD;  Location: [facility] INVASIVE CV LAB;  Service: Cardiovascular;  Laterality: N/A;   PERIPHERAL VASCULAR INTERVENTION Left 10/01/2022   Procedure: PERIPHERAL VASCULAR INTERVENTION;  Surgeon: Nada Libman, MD;  [facility];  Service: Cardiovascular;  Laterality: Left;  fem-pop bypass   PERIPHERAL VASCULAR THROMBECTOMY Left 10/01/2022   Procedure: PERIPHERAL VASCULAR THROMBECTOMY;  Surgeon: Nada Libman, MD;  [facility];  Service: Cardiovascular;  Laterality: Left;  fem-pop bypass   POLYPECTOMY  07/02/2019   Procedure: POLYPECTOMY;  Surgeon: Malissa Hippo, MD;  [facility];  Service: Endoscopy;;   POSTERIOR LUMBAR FUSION  2008 X 2   REDUCTION MAMMAPLASTY  1996?   TONSILLECTOMY AND ADENOIDECTOMY  1959 AND 2000    UVULOPALATOPHARYNGOPLASTY, TONSILLECTOMY AND SEPTOPLASTY  2000's   Social History:  reports that she has been smoking cigarettes. She started smoking about 43  years ago. She has a 20.5 pack-year smoking history. She has never used smokeless tobacco. She reports that she does not drink alcohol and does not use drugs. Family History:  Family History  Problem Relation Age of Onset   Breast cancer Mother    Heart disease Mother    Hypertension Mother    Diabetes Father    Heart disease Father    Stroke Father    Heart disease Sister    Hypertension Sister    Hypertension Sister    Hyperlipidemia Sister    Diabetes Sister    Allergic rhinitis Sister    Emphysema Other        great uncle   Aneurysm Sister        brain   Colon cancer Paternal Uncle    Angioedema Neg Hx    Asthma Neg Hx    Eczema Neg Hx    Immunodeficiency Neg Hx    Urticaria Neg Hx      HOME MEDICATIONS: Allergies as of 11/29/2022       Reactions   Sulfa Antibiotics Shortness Of Breath, Palpitations   Codeine Other (See Comments)   Recovering Addict does not like to take Narcotics   Fish Allergy Hives, Swelling   Tongue swelling   Iodine Swelling   Metformin And Related Diarrhea   Soliqua [insulin Glargine-lixisenatide] Itching, Other (See Comments)   "tongue swelling"   Adhesive [tape] Rash   Paper tape is ok   Shellfish Allergy Swelling, Rash   Tongue swelling        Medication List        Accurate as of November 29, 2022 11:16 AM. If you have any questions, ask your nurse or doctor.          acetaminophen 325 MG tablet Commonly known as: TYLENOL Take 2 tablets (650 mg total) by mouth every 6 (six) hours as needed for mild pain (or Fever >/= 101).   albuterol 108 (90 Base) MCG/ACT inhaler Commonly known as: ProAir HFA 1-2 inhalations every 4-6 hours as needed for cough or wheeze. What changed:  how much to take how to take this when to take this reasons to take this additional  instructions   ALPRAZolam 1 MG tablet Commonly known as: XANAX Take 1 mg by mouth 2 (two) times daily as needed for anxiety.   amitriptyline 25 MG tablet Commonly known as: ELAVIL Take 25 mg by mouth at bedtime as needed for sleep.   aspirin EC 81 MG tablet Take 81 mg by mouth daily.   Azelastine HCl 0.15 % Soln Place 2 sprays into both nostrils 2 (two) times daily. What changed:  when to take this reasons to take this   bisacodyl 5 MG EC tablet Commonly known as: DULCOLAX Take 5 mg by mouth daily as needed for moderate constipation.   ciprofloxacin 500 MG tablet Commonly known as: CIPRO Take 500 mg by mouth 2 (two) times daily.   clopidogrel 75 MG tablet Commonly known as: Plavix Take 1 tablet (75 mg total) by mouth daily.   Dexcom G6 Receiver Devi by Does not apply route.   doxycycline 100 MG capsule Commonly known as: VIBRAMYCIN Take 1 capsule (100 mg total) by mouth 2 (two) times daily. What changed:  when to take this reasons to take this   empagliflozin 25 MG Tabs tablet Commonly known as: Jardiance Take 1 tablet (25 mg total) by mouth daily before breakfast.   EPINEPHrine 0.3 mg/0.3 mL Soaj injection Commonly known  as: EPI-PEN Inject 0.3 mLs (0.3 mg total) into the muscle once.   fluconazole 150 MG tablet Commonly known as: DIFLUCAN Take 1 tablet by mouth as needed (yeast infection).   hydrocortisone 2.5 % rectal cream Commonly known as: ANUSOL-HC Place rectally daily as needed for hemorrhoids.   insulin lispro 100 UNIT/ML injection Commonly known as: HUMALOG Max daily 60 units What changed:  when to take this additional instructions   losartan-hydrochlorothiazide 50-12.5 MG tablet Commonly known as: Hyzaar Take 1 tablet by mouth daily.   lubiprostone 8 MCG capsule Commonly known as: AMITIZA Take 1 capsule (8 mcg total) by mouth 2 (two) times daily with a meal.   Magnesium 200 MG Tabs Take 1 tablet (200 mg total) by mouth daily. What  changed: when to take this   montelukast 10 MG tablet Commonly known as: SINGULAIR Take 10 mg by mouth at bedtime as needed (allergies).   mupirocin ointment 2 % Commonly known as: BACTROBAN Apply 1 application topically 2 (two) times daily. What changed:  when to take this reasons to take this   nitroGLYCERIN 0.4 MG SL tablet Commonly known as: NITROSTAT PLACE 1 TAB UNDER TONGUE EVERY 5 MINS AS NEEDED FOR CHEST PAIN - MAX 3 DOSES THEN 911   nortriptyline 10 MG capsule Commonly known as: PAMELOR Take 1 capsule (10 mg total) by mouth at bedtime. What changed:  when to take this reasons to take this   Omnipod 5 G6 Pods (Gen 5) Misc 1 Device by Other route every 3 (three) days. Change every 72 hours   pantoprazole 40 MG tablet Commonly known as: PROTONIX TAKE 1 TABLET BY MOUTH EVERY DAY   polyethylene glycol 17 g packet Commonly known as: MIRALAX / GLYCOLAX Take 17 g by mouth daily as needed for moderate constipation or severe constipation.   potassium chloride 10 MEQ tablet Commonly known as: KLOR-CON Take 10 mEq by mouth 2 (two) times daily.   rosuvastatin 40 MG tablet Commonly known as: CRESTOR Take 1 tablet (40 mg total) by mouth daily.   SUMAtriptan 100 MG tablet Commonly known as: IMITREX Take 100 mg by mouth every 2 (two) hours as needed for migraine.   temazepam 30 MG capsule Commonly known as: RESTORIL Take 15 mg by mouth at bedtime as needed for sleep.         OBJECTIVE:   Vital Signs: BP 130/72 (BP Location: Left Arm, Patient Position: Sitting, Cuff Size: Small)   Pulse 82   Ht 5\' 4"  (1.626 m)   Wt 126 lb (57.2 kg)   SpO2 99%   BMI 21.63 kg/m   Wt Readings from Last 3 Encounters:  11/29/22 126 lb (57.2 kg)  11/04/22 126 lb (57.2 kg)  10/01/22 132 lb (59.9 kg)     Exam: General: Pt appears well and is in NAD  Lungs: Clear with good BS bilat   Heart: RRR   Extremities: No pretibial edema.  Neuro: MS is good with appropriate affect,  pt is alert and Ox3      DM foot exam: 05/27/2022   The skin of the feet is intact without sores or ulcerations. The pedal pulses are 1 + bilaterally  The sensation is intact to a screening 5.07, 10 gram monofilament bilaterally      DATA REVIEWED:     Latest Reference Range & Units 10/01/22 11:50  Sodium 135 - 145 mmol/L 142  Potassium 3.5 - 5.1 mmol/L 4.5  Chloride 98 - 111 mmol/L 104  Glucose  70 - 99 mg/dL 284 (H)  BUN 8 - 23 mg/dL 20  Creatinine 1.32 - 4.40 mg/dL 1.02  Calcium Ionized 7.25 - 1.40 mmol/L 1.35    Latest Reference Range & Units 11/29/22 11:35  Creatinine,U mg/dL 36.6  Microalb, Ur 0.0 - 1.9 mg/dL 2.0 (H)  MICROALB/CREAT RATIO 0.0 - 30.0 mg/g 4.9    Glutamic Acid Decarb Ab <5 IU/mL <5     ISLET CELL ANTIBODY SCREEN Negative  ASSESSMENT / PLAN / RECOMMENDATIONS:   1) Type 2 Diabetes Mellitus, with poorly controlled, with neuropathic and macrovascular complications and microalbuminuria  - Most recent A1c of 6.4%. Goal A1c < 7.0 %.    -I have praised  the patient on optimizing glucose control -Unfortunately, she is having recurrent yeast infections, she does like the Jardiance due to its benefits, we did discuss reducing the dose by 50% and see how she does with that -Has Hx of pancreatitis in 2010, so DPP-4 inhibitors and GLp-1 agonists are  CONTRAINDICATED - GAD-65 and Islet cell Ab's negative  -I have also switched her OmniPod to Dexcom G7    MEDICATIONS: Decrease Jardiance 25 mg, half a tablet daily Humalog     Pump   Omnipod Settings   Insulin type   Humalog    Basal rate       0000  1.30 u/h    0400 1.45   0800 1.55   1200 1.75      I:C ratio       0000 1:5                  Sensitivity       0000  20      Goal       0000  120        EDUCATION / INSTRUCTIONS: BG monitoring instructions: Patient is instructed to check her blood sugars 4 times a day, before meals and bedtime . Call [facility] if: BG  persistently < 70  I reviewed the Rule of 15 for the treatment of hypoglycemia in detail with the patient. Literature supplied.   2) Diabetic complications:  Eye: Does not have known diabetic retinopathy.  Neuro/ Feet: Does  have known diabetic peripheral neuropathy .  Renal: Patient does not have known baseline CKD.    3) Dyslipidemia:  - LDL has been at goal  - No changes     Medication Continue rosuvastatin 40 mg daily    4) Microalbuminuria :   -  MA/Cr ratio slightly elevated at 48 in February 2023, repeat today shows normalization  - She is on Losartan- HCTZ    F/U 6 months   Signed electronically by: Lyndle Herrlich, MD  [facility] Endocrinology  [facility] [address] [place] [place], [place] [address] Phone: [phone] FAX: [phone]   CC: Carin Hock, PA [address] [place] [address] Phone: [phone]  Fax: [phone]  Return to Endocrinology clinic as below: Future Appointments  Date Time Provider Department Center  01/09/2023  3:30 PM Iva Boop, MD [facility]  03/17/2023  1:30 PM Runell Gess, MD [facility] None  05/26/2023  1:00 PM [facility] 3 [facility]  05/26/2023  1:30 PM [facility]  05/26/2023  2:40 PM Nada Libman, MD [facility]

## 2022-11-29 NOTE — Patient Instructions (Signed)
Decrease Jardiance 25 mg, half a tablet daily     HOW TO TREAT LOW BLOOD SUGARS (Blood sugar LESS THAN 70 MG/DL) Please follow the RULE OF 15 for the treatment of hypoglycemia treatment (when your (blood sugars are less than 70 mg/dL)   STEP 1: Take 15 grams of carbohydrates when your blood sugar is low, which includes:  3-4 GLUCOSE TABS  OR 3-4 OZ OF JUICE OR REGULAR SODA OR ONE TUBE OF GLUCOSE GEL    STEP 2: RECHECK blood sugar in 15 MINUTES STEP 3: If your blood sugar is still low at the 15 minute recheck --> then, go back to STEP 1 and treat AGAIN with another 15 grams of carbohydrates.

## 2022-12-02 ENCOUNTER — Encounter: Payer: Self-pay | Admitting: Internal Medicine

## 2022-12-03 ENCOUNTER — Telehealth: Payer: Self-pay

## 2022-12-03 DIAGNOSIS — E1149 Type 2 diabetes mellitus with other diabetic neurological complication: Secondary | ICD-10-CM | POA: Diagnosis not present

## 2022-12-03 DIAGNOSIS — E1165 Type 2 diabetes mellitus with hyperglycemia: Secondary | ICD-10-CM | POA: Diagnosis not present

## 2022-12-03 NOTE — Telephone Encounter (Signed)
Patient has been advised and will make changes

## 2022-12-03 NOTE — Telephone Encounter (Signed)
Patient states that her blood sugar has dropped low 2 times. Once on Sunday and yesterday. Patient states that medication were just adjusted and wants to know if further adjustment needs to be made. Sugar had dropped to the 40 range.

## 2022-12-04 ENCOUNTER — Encounter: Payer: Self-pay | Admitting: Internal Medicine

## 2022-12-07 DIAGNOSIS — K529 Noninfective gastroenteritis and colitis, unspecified: Secondary | ICD-10-CM | POA: Diagnosis not present

## 2022-12-08 ENCOUNTER — Other Ambulatory Visit: Payer: Self-pay | Admitting: Internal Medicine

## 2022-12-09 ENCOUNTER — Telehealth: Payer: Self-pay | Admitting: Internal Medicine

## 2022-12-09 ENCOUNTER — Other Ambulatory Visit: Payer: Self-pay

## 2022-12-09 DIAGNOSIS — R110 Nausea: Secondary | ICD-10-CM | POA: Diagnosis not present

## 2022-12-09 NOTE — Telephone Encounter (Signed)
Patient called requesting to speak with a nurse regarding IBS symptoms. ?

## 2022-12-09 NOTE — Telephone Encounter (Signed)
Pt stated that she feels like she is having an IBS flare from last week. Having 3 or 4 diarrhea stools a day now. Pt stated that she started taking some imodium on Saturday 4 tablets and 2 yesterday( Sunday) and none today. Pt stated that she feels that the imodium is helping some but just wanted to make our office aware. Pt was notified to continue to take the imodium daily and monitor her symptoms and call us back on Wednesday if her symptoms worsen. Pt verbalized understanding with all questions answered.

## 2022-12-13 NOTE — Telephone Encounter (Signed)
Patient stated that imodium is not working and is requesting a call back to discuss. Please advise.

## 2022-12-16 ENCOUNTER — Ambulatory Visit: Payer: Self-pay | Admitting: *Deleted

## 2022-12-16 NOTE — Patient Outreach (Signed)
  Care Coordination   Follow Up Visit Note   12/16/2022 Name: [name] MRN: [id number] DOB: 1955-08-28  [name] is a 67 y.o. year old female who sees Bartolo, Janyth Pupa, [place] for primary care. I spoke with  [name] by phone today.  What matters to the patients health and wellness today?  New ophthalmologist  Diabetes - had trouble with her continuous glucose meter, Dexcom G7 Session with Nutritionist on 10/24/22- learning to count carbohydrates A1c better at 6.4 Developed recurrent yeast infections/Jardiance decreased  Followed by Dr Lonzo Cloud  Status mechanical thrombectomy of left femoral to above knee popliteal artery bypass -site healed & had follow up with Dr Myra Gianotti. 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, reported right leg claudication on 11/04/22  Goals Addressed             This Visit's Progress    [name] care coordination services   On track    Interventions Today    Flowsheet Row Most Recent Value  Chronic Disease   Chronic disease during today's visit Diabetes, Other  [new ophthamologist, right leg by pass site healed, now left leg with claudication]  General Interventions   General Interventions Discussed/Reviewed General Interventions Reviewed, Annual Eye Exam, Labs, Durable Medical Equipment (DME), Communication with, [facility], Doctor Visits  Labs Hgb A1c every 3 months  Doctor Visits Discussed/Reviewed Doctor Visits Reviewed, PCP, Specialist  Durable Medical Equipment (DME) Glucomoter  PCP/Specialist Visits Compliance with follow-up visit  Communication with PCP/Specialists  [assisted with an unsuccessful outreach to an in network ophthamologist, Chalmers Guest [phone], Left a message for a return call to patient]  Exercise Interventions   Exercise Discussed/Reviewed Exercise Reviewed, Physical Activity  Education Interventions   Education Provided Provided Education  Provided Verbal Education On Eye Care, Community Resources  Mental  Health Interventions   Mental Health Discussed/Reviewed Mental Health Reviewed, Coping Strategies              SDOH assessments and interventions completed:  No     Care Coordination Interventions:  Yes, provided   Follow up plan: Follow up call scheduled for 02/17/23    Encounter Outcome:  Pt. Visit Completed    L. Noelle Penner, RN, BSN, CCM [facility] Community Coordinator Office number [phone]

## 2022-12-16 NOTE — Patient Instructions (Addendum)
Visit Information  Thank you for taking time to visit with me today. Please don't hesitate to contact me if I can be of assistance to you.   Following are the goals we discussed today:   Goals Addressed             This Visit's Progress    [name] care coordination services   On track    Interventions Today    Flowsheet Row Most Recent Value  Chronic Disease   Chronic disease during today's visit Diabetes, Other  [new ophthamologist, right leg by pass site healed, now left leg with claudication]  General Interventions   General Interventions Discussed/Reviewed General Interventions Reviewed, Annual Eye Exam, Labs, Durable Medical Equipment (DME), Communication with, [facility], Doctor Visits  Labs Hgb A1c every 3 months  Doctor Visits Discussed/Reviewed Doctor Visits Reviewed, PCP, Specialist  Durable Medical Equipment (DME) Glucomoter  PCP/Specialist Visits Compliance with follow-up visit  Communication with PCP/Specialists  [assisted with an unsuccessful outreach to an in network ophthamologist, Chalmers Guest [phone], Left a message for a return call to patient]  Exercise Interventions   Exercise Discussed/Reviewed Exercise Reviewed, Physical Activity  Education Interventions   Education Provided Provided Education  Provided Verbal Education On Eye Care, Community Resources  Mental Health Interventions   Mental Health Discussed/Reviewed Mental Health Reviewed, Coping Strategies              Our next appointment is by telephone on 02/17/23 at 1145  Please call the care guide team at [phone] if you need to cancel or reschedule your appointment.   If you are experiencing a Mental Health or Behavioral Health Crisis or need someone to talk to, please call the Suicide and Crisis Lifeline: 988 call the [place] National Suicide Prevention Lifeline: 716 262 4473 or TTY: 807-564-1765 TTY (830)015-1370) to talk to a trained counselor call 1-800-273-TALK (toll free,  24 hour hotline) go to [facility] [address], [place] [phone]) call 911   Patient verbalizes understanding of instructions and care plan provided today and agrees to view in MyChart. Active MyChart status and patient understanding of how to access instructions and care plan via MyChart confirmed with patient.     The patient has been provided with contact information for the care management team and has been advised to call with any health related questions or concerns.    L. Noelle Penner, RN, BSN, CCM [facility] Care Management Community Coordinator Office number [phone]

## 2022-12-16 NOTE — Telephone Encounter (Signed)
Please see notes below also. Pt stated that the felt as if the diarrhea was improving some last week but increased on Friday. Pt stated that she had 4 loose stools on Friday, 2 loose stools on Saturday and Sunday. Pt stated that she is taking two imodium a day. Please review and advise.

## 2022-12-16 NOTE — Patient Instructions (Addendum)
Visit Information  Thank you for taking time to visit with me today. Please don't hesitate to contact me if I can be of assistance to you.   Following are the goals we discussed today:   Goals Addressed             This Visit's Progress    [name] care coordination services       Interventions Today    Flowsheet Row Most Recent Value  Chronic Disease   Chronic disease during today's visit Other  [assist with new in network provider options]  General Interventions   General Interventions Discussed/Reviewed General Interventions Reviewed, Doctor Visits  Doctor Visits Discussed/Reviewed Doctor Visits Reviewed, PCP              Our next appointment is by telephone on 10/03/22 at 1  Please call the care guide team at [phone] if you need to cancel or reschedule your appointment.   If you are experiencing a Mental Health or Behavioral Health Crisis or need someone to talk to, please call the Suicide and Crisis Lifeline: 988 call the [place] National Suicide Prevention Lifeline: 504-566-2369 or TTY: (325) 422-9698 TTY 415-794-0396) to talk to a trained counselor call 1-800-273-TALK (toll free, 24 hour hotline) go to [facility] [address] [place] [phone]) call 911   Patient verbalizes understanding of instructions and care plan provided today and agrees to view in MyChart. Active MyChart status and patient understanding of how to access instructions and care plan via MyChart confirmed with patient.     The patient has been provided with contact information for the care management team and has been advised to call with any health related questions or concerns.    L. Noelle Penner, RN, BSN, CCM [facility] Care Management Community Coordinator Office number [phone]

## 2022-12-16 NOTE — Patient Outreach (Signed)
  Care Coordination   Follow Up Visit Note   12/16/2022 Late entry for 09/27/22 Name: [name] MRN: [id number] DOB: 1955/12/06  [name] is a 67 y.o. year old female who sees Carin Hock, [place] for primary care. I spoke with  [name] by phone today.  What matters to the patients health and wellness today?  Assist with new provider options   Goals Addressed             This Visit's Progress    [name] care coordination services       Interventions Today    Flowsheet Row Most Recent Value  Chronic Disease   Chronic disease during today's visit Other  [assist with new in network provider options]  General Interventions   General Interventions Discussed/Reviewed General Interventions Reviewed, Doctor Visits  Doctor Visits Discussed/Reviewed Doctor Visits Reviewed, PCP              SDOH assessments and interventions completed:  No     Care Coordination Interventions:  Yes, provided   Follow up plan: Follow up call scheduled for 10/03/22    Encounter Outcome:  Pt. Visit Completed    L. Noelle Penner, RN, BSN, CCM [facility] Care Management Community Coordinator Office number [phone]

## 2022-12-18 NOTE — Telephone Encounter (Signed)
I want to be sure she has held Amitiza and MiraLax  If not she should  Otherwise Imodium AD up to 6 a day as needed

## 2022-12-18 NOTE — Telephone Encounter (Signed)
Pt made aware of Dr. Gessner recommendations: Pt verbalized understanding with all questions answered.   

## 2023-01-02 DIAGNOSIS — E1149 Type 2 diabetes mellitus with other diabetic neurological complication: Secondary | ICD-10-CM | POA: Diagnosis not present

## 2023-01-02 DIAGNOSIS — E1165 Type 2 diabetes mellitus with hyperglycemia: Secondary | ICD-10-CM | POA: Diagnosis not present

## 2023-01-09 ENCOUNTER — Ambulatory Visit (INDEPENDENT_AMBULATORY_CARE_PROVIDER_SITE_OTHER): Payer: 59 | Admitting: Internal Medicine

## 2023-01-09 ENCOUNTER — Encounter: Payer: Self-pay | Admitting: Internal Medicine

## 2023-01-09 VITALS — BP 140/80 | HR 94 | Ht 64.0 in | Wt 132.0 lb

## 2023-01-09 DIAGNOSIS — K581 Irritable bowel syndrome with constipation: Secondary | ICD-10-CM

## 2023-01-09 DIAGNOSIS — R1013 Epigastric pain: Secondary | ICD-10-CM

## 2023-01-09 MED ORDER — LUBIPROSTONE 8 MCG PO CAPS
8.0000 ug | ORAL_CAPSULE | Freq: Two times a day (BID) | ORAL | 5 refills | Status: AC
Start: 2023-01-09 — End: ?

## 2023-01-09 NOTE — Progress Notes (Signed)
[name] 66 y.o. May 12, 1955 [id number]  Assessment & Plan:   Encounter Diagnoses  Name Primary?   Irritable bowel syndrome with constipation Yes   Dyspepsia    I have prescribed the Amitiza again.  Hopefully that will help her.  She will follow-up as needed and continue other medications. Meds ordered this encounter  Medications   lubiprostone (AMITIZA) 8 MCG capsule    Sig: Take 1 capsule (8 mcg total) by mouth 2 (two) times daily with a meal.    Dispense:  60 capsule    Refill:  5       Subjective:   Chief Complaint: Constipation  HPI 66 year old black woman with a long history of dyspepsia and irritable bowel syndrome which can be mixed.  She was ill with a gastroenteritis in August she called [place] she saw urgent care.  We advised using Imodium.  It took a while but the diarrhea did subside.  She thinks she may have gotten ill from some "liquid eggs".  She is now complaining of constipation with minimal defecation in the last several days.  She does not recall taking lubiprostone which was prescribed in May when I saw her.  It is on her medicine list.  She does not recognize pill pictures so presumably she never picked that up.  Dyspepsia seems reasonably controlled at this time.  PAST GI PROCEDURES:   EGD 07/01/2019: - Normal esophagus. - Z-line irregular, 35 cm from the incisors. - 2 cm hiatal hernia with focal erythema to mucosa below GEJ. - A Nissen fundoplication was found. The wrap appears loose. - Normal duodenal bulb, second portion of the duodenum and area of the papilla. - No specimens collected.   Colonoscopy 07/02/2019: - One 5 mm polyp at the hepatic flexure, removed with a cold snare. Resected and retrieved. - Diverticulosis in the sigmoid colon, in the descending colon and in the transverse colon.   Colonoscopy 03/02/2020: - Three 2 to 4 mm polyps in the proximal transverse colon and at the hepatic flexure, removed with a cold snare. Resected  and retrieved. - Mild pancolonic diverticulosis. - Non-bleeding internal hemorrhoids. - The examined portion of the ileum was normal. - The examination was otherwise normal on direct and retroflexion views. No masses. - 5 year colonoscopy recall       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   Fish Allergy Hives and Swelling    Tongue swelling   Iodine Swelling   Metformin And Related Diarrhea   [name] [Insulin Glargine-Lixisenatide] Itching and Other (See Comments)    "tongue swelling"   Adhesive [Tape] Rash    Paper tape is ok   Shellfish Allergy Swelling and Rash    Tongue swelling   Current Meds  Medication Sig   acetaminophen (TYLENOL) 325 MG tablet Take 2 tablets (650 mg total) by mouth every 6 (six) hours as needed for mild pain (or Fever >/= 101).   albuterol (PROAIR HFA) 108 (90 Base) MCG/ACT inhaler 1-2 inhalations every 4-6 hours as needed for cough or wheeze. (Patient taking differently: Inhale 1-2 puffs into the lungs every 4 (four) hours as needed for shortness of breath.)   ALPRAZolam (XANAX) 1 MG tablet Take 1 mg by mouth 2 (two) times daily as needed for anxiety.   amitriptyline (ELAVIL) 25 MG tablet Take 25 mg by mouth at bedtime  as needed for sleep.   aspirin EC 81 MG tablet Take 81 mg by mouth daily.   Azelastine HCl 0.15 % SOLN Place 2 sprays into both nostrils 2 (two) times daily. (Patient taking differently: Place 2 sprays into both nostrils daily as needed (allergies).)   bisacodyl (DULCOLAX) 5 MG EC tablet Take 5 mg by mouth daily as needed for moderate constipation.   clopidogrel (PLAVIX) 75 MG tablet Take 1 tablet (75 mg total) by mouth daily.   Continuous Blood Gluc Receiver (DEXCOM G6 RECEIVER) DEVI by Does not apply route.   Continuous Glucose Sensor (DEXCOM G7 SENSOR) MISC 1 Device by Does not apply route as directed.    doxycycline (VIBRAMYCIN) 100 MG capsule Take 1 capsule (100 mg total) by mouth 2 (two) times daily. (Patient taking differently: Take 100 mg by mouth as needed (Infection).)   empagliflozin (JARDIANCE) 25 MG TABS tablet Take 1 tablet (25 mg total) by mouth daily before breakfast.   EPINEPHrine 0.3 mg/0.3 mL IJ SOAJ injection Inject 0.3 mLs (0.3 mg total) into the muscle once.   hydrocortisone (ANUSOL-HC) 2.5 % rectal cream Place rectally daily as needed for hemorrhoids.   Insulin Disposable Pump (OMNIPOD 5 G7 INTRO, GEN 5,) KIT 1 Device by Does not apply route every other day.   Insulin Disposable Pump (OMNIPOD 5 G7 PODS, GEN 5,) MISC 1 Device by Does not apply route every other day.   insulin lispro (HUMALOG) 100 UNIT/ML injection Max daily 60 units   losartan-hydrochlorothiazide (HYZAAR) 50-12.5 MG tablet Take 1 tablet by mouth daily.   lubiprostone (AMITIZA) 8 MCG capsule Take 1 capsule (8 mcg total) by mouth 2 (two) times daily with a meal.   Magnesium 200 MG TABS Take 1 tablet (200 mg total) by mouth daily. (Patient taking differently: Take 200 mg by mouth 2 (two) times daily.)   montelukast (SINGULAIR) 10 MG tablet Take 10 mg by mouth at bedtime as needed (allergies).   mupirocin ointment (BACTROBAN) 2 % Apply 1 application topically 2 (two) times daily. (Patient taking differently: Apply 1 application  topically 2 (two) times daily as needed.)   nitroGLYCERIN (NITROSTAT) 0.4 MG SL tablet PLACE 1 TAB UNDER TONGUE EVERY 5 MINS AS NEEDED FOR CHEST PAIN - MAX 3 DOSES THEN 911   nortriptyline (PAMELOR) 10 MG capsule Take 1 capsule (10 mg total) by mouth at bedtime. (Patient taking differently: Take 10 mg by mouth at bedtime as needed for sleep.)   pantoprazole (PROTONIX) 40 MG tablet TAKE 1 TABLET BY MOUTH EVERY DAY (Patient taking differently: Take 40 mg by mouth daily.)   polyethylene glycol (MIRALAX / GLYCOLAX) 17 g packet Take 17 g by mouth daily as needed for moderate constipation or severe  constipation.   potassium chloride (KLOR-CON) 10 MEQ tablet Take 10 mEq by mouth 2 (two) times daily.   rosuvastatin (CRESTOR) 40 MG tablet Take 1 tablet (40 mg total) by mouth daily.   SUMAtriptan (IMITREX) 100 MG tablet Take 100 mg by mouth every 2 (two) hours as needed for migraine.    temazepam (RESTORIL) 30 MG capsule Take 15 mg by mouth at bedtime as needed for sleep.   Past Medical History:  Diagnosis Date   Allergic urticaria 07/10/2015   Allergy    Anemia    hx   Angioedema 07/10/2015   Anxiety    Arthritis    "neck, left hand" (09/14/2012)   Asthma    Cancer (HCC) 1985   ovarian, no treatment except surgery  Cataract    Complication of anesthesia    OCCASIONAL TROUBLE TURNING NECK TO RIGHT   Critical lower limb ischemia (HCC)    10/2014 s/p L SFA stenting   Diverticulosis of colon with hemorrhage April 2013   GERD (gastroesophageal reflux disease)    GI bleed    H/O hiatal hernia    Heart attack [facility]    2003 mild MI, March 2013 mild MI   Hidradenitis    groin   History of blood transfusion 1985 AND 2013   Hyperlipidemia    Hypertension    Irritable bowel syndrome    Left-sided weakness    since stroke, left eye trouble seeing   Migraines    Mild CAD    a. Cath 09/2010: mild luminal irregularities of LAD, 30% prox RCA and 20-30% mRCA, EF 65%.   Neuropathy    Obesity    Osteoporosis    PAD (peripheral artery disease) (HCC)    a. critical limb ischemia s/p PTA/stenting of L SFA 10/2014. c. occ prior SFA stent by angio 01/2016, for possible PV bypass.   Pneumonia    baby   Recurrent upper respiratory infection (URI)    S/P arterial stent-mid Lt SFA 11/03/14 11/04/2014   Schatzki's ring    Sinus problem    Stomach ulcer 1972   non-bleeding   Stroke (HCC) 07-2007, 07-2008, 07-2009   total 3 strokes; mild left sided weakness and left eye "jumps".   Type II diabetes mellitus (HCC)    Vitamin B 12 deficiency 07-15-2013   Past Surgical History:  Procedure Laterality  Date   ABDOMINAL AORTOGRAM W/LOWER EXTREMITY Left 10/01/2022   Procedure: ABDOMINAL AORTOGRAM W/LOWER EXTREMITY;  Surgeon: Nada Libman, MD;  Location: [facility] INVASIVE CV LAB;  Service: Cardiovascular;  Laterality: Left;   ABDOMINAL HYSTERECTOMY  1985   ADENOIDECTOMY     ANTERIOR CERVICAL DECOMP/DISCECTOMY FUSION  2002   ANTERIOR CERVICAL DECOMP/DISCECTOMY FUSION N/A 04/08/2014   Procedure: Cervical Six-Seven ANTERIOR CERVICAL DECOMPRESSION/DISCECTOMY FUSION Plating and Bonegraft  2 LEVELS;  Surgeon: Coletta Memos, MD;  Location: [facility] NEURO ORS;  Service: Neurosurgery;  Laterality: N/A;  Cervical Six-Seven ANTERIOR CERVICAL DECOMPRESSION/DISCECTOMY FUSION Plating and Bonegraft  2 LEVELS   APPENDECTOMY  1985   AXILLARY HIDRADENITIS EXCISION  1990-2008   bilateral   BACK SURGERY     BREAST BIOPSY Right 2007   BREAST CYST EXCISION Right 2008   BREAST REDUCTION SURGERY     CARDIAC CATHETERIZATION  2004   mild disease   CATARACT EXTRACTION W/PHACO Left 05/16/2015   Procedure: CATARACT EXTRACTION PHACO AND INTRAOCULAR LENS PLACEMENT (IOC);  Surgeon: Jethro Bolus, MD;  Location: [facility] ORS;  Service: Ophthalmology;  Laterality: Left;  CDE: 4.24   CATARACT EXTRACTION W/PHACO Right 05/30/2015   Procedure: CATARACT EXTRACTION RIGHT EYE PHACO AND INTRAOCULAR LENS PLACEMENT ;  Surgeon: Jethro Bolus, MD;  Location: [facility] ORS;  Service: Ophthalmology;  Laterality: Right;  CDE:4.08   CHOLECYSTECTOMY  1990's   COLONOSCOPY  08/12/2011   Procedure: COLONOSCOPY;  Surgeon: Meryl Dare, MD,FACG;  Location: [facility] ENDOSCOPY;  Service: Endoscopy;  Laterality: N/A;   COLONOSCOPY  06/19/2006   COLONOSCOPY WITH PROPOFOL N/A 07/02/2019   Procedure: COLONOSCOPY WITH PROPOFOL;  Surgeon: Malissa Hippo, MD;  Location: [facility];  Service: Endoscopy;  Laterality: N/A;   cyst thigh Right    ESOPHAGOGASTRODUODENOSCOPY  08/12/2011   Procedure: ESOPHAGOGASTRODUODENOSCOPY (EGD);  Surgeon: Meryl Dare, MD,FACG;  Location: [facility]  ENDOSCOPY;  Service: Endoscopy;  Laterality: N/A;  ESOPHAGOGASTRODUODENOSCOPY  06/04/2005   ESOPHAGOGASTRODUODENOSCOPY (EGD) WITH PROPOFOL N/A 07/01/2019   Procedure: ESOPHAGOGASTRODUODENOSCOPY (EGD) WITH PROPOFOL;  Surgeon: Malissa Hippo, MD;  [facility];  Service: Endoscopy;  Laterality: N/A;   FEMORAL-POPLITEAL BYPASS GRAFT Left 06/19/2016   Procedure: Left Leg BYPASS GRAFT FEMORAL-POPLITEAL ARTERY;  Surgeon: Nada Libman, MD;  Location: [facility] OR;  Service: Vascular;  Laterality: Left;   GIVENS CAPSULE STUDY  08/13/2011   Procedure: GIVENS CAPSULE STUDY;  Surgeon: Meryl Dare, MD,FACG;  Location: [facility] ENDOSCOPY;  Service: Endoscopy;  Laterality: N/A;   HAMMER TOE SURGERY Bilateral ~ 2000   HEMOSTASIS CLIP PLACEMENT  07/02/2019   Procedure: HEMOSTASIS CLIP PLACEMENT;  Surgeon: Malissa Hippo, MD;  [facility];  Service: Endoscopy;;  hepatic flexure   HIATAL HERNIA REPAIR     HYDRADENITIS EXCISION  01/2011; 03/2012   'groin and abdomen; 03/2012" (09/14/2012)   HYDRADENITIS EXCISION  04/01/2012   Procedure: EXCISION HYDRADENITIS GROIN;  Surgeon: Valarie Merino, MD;  Location: [facility] ORS;  Service: General;  Laterality: Bilateral;  Excision of Hydradenitis of Perineum   HYDRADENITIS EXCISION N/A 09/17/2013   Procedure: EXCISION PERINEAL HIDRADENITIS ;  Surgeon: Valarie Merino, MD;  Location: [facility] ORS;  Service: General;  Laterality: N/A;  also in the pubis area   LEFT HEART CATH AND CORONARY ANGIOGRAPHY N/A 12/26/2016   Procedure: LEFT HEART CATH AND CORONARY ANGIOGRAPHY;  Surgeon: Swaziland, Peter M, MD;  Location: [facility] INVASIVE CV LAB;  Service: Cardiovascular;  Laterality: N/A;   MASS EXCISION Right 09/17/2013   Procedure: EXCISION MASS;  Surgeon: Valarie Merino, MD;  Location: [facility] ORS;  Service: General;  Laterality: Right;   NISSEN FUNDOPLICATION  1996   PERIPHERAL VASCULAR CATHETERIZATION N/A 11/03/2014   Procedure: Lower Extremity Angiography;  Surgeon: Runell Gess,  MD;  [facility];  Service: Cardiovascular;  Laterality: N/A;   PERIPHERAL VASCULAR CATHETERIZATION N/A 01/29/2016   Procedure: Lower Extremity Angiography;  Surgeon: Runell Gess, MD;  Location: [facility] INVASIVE CV LAB;  Service: Cardiovascular;  Laterality: N/A;   PERIPHERAL VASCULAR INTERVENTION Left 10/01/2022   Procedure: PERIPHERAL VASCULAR INTERVENTION;  Surgeon: Nada Libman, MD;  [facility];  Service: Cardiovascular;  Laterality: Left;  fem-pop bypass   PERIPHERAL VASCULAR THROMBECTOMY Left 10/01/2022   Procedure: PERIPHERAL VASCULAR THROMBECTOMY;  Surgeon: Nada Libman, MD;  [facility];  Service: Cardiovascular;  Laterality: Left;  fem-pop bypass   POLYPECTOMY  07/02/2019   Procedure: POLYPECTOMY;  Surgeon: Malissa Hippo, MD;  [facility]: [facility];  Service: Endoscopy;;   POSTERIOR LUMBAR FUSION  2008 X 2   REDUCTION MAMMAPLASTY  1996?   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   Review of Systems As per HPI  Objective:   Physical Exam BP (!) 140/80   Pulse 94   Ht 5\' 4"  (1.626 m)   Wt 132 lb (59.9 kg)   BMI 22.66 kg/m  NAD Abd is soft NT no mass

## 2023-01-09 NOTE — Patient Instructions (Addendum)
We have sent the following medications to your pharmacy for you to pick up at your convenience:  Amitiza  If you get another stomach virus/food poisoning, you may take Imodium.  Please follow up as needed.  _______________________________________________________  If your blood pressure at your visit was 140/90 or greater, please contact your primary care physician to follow up on this.  _______________________________________________________  If you are age 35 or older, your body mass index should be between 23-30. Your Body mass index is 22.66 kg/m. If this is out of the aforementioned range listed, please consider follow up with your Primary Care Provider.  If you are age 54 or younger, your body mass index should be between 19-25. Your Body mass index is 22.66 kg/m. If this is out of the aformentioned range listed, please consider follow up with your Primary Care Provider.   ________________________________________________________  The [place] [facility] providers would like to encourage you to use [facility] to communicate with providers for non-urgent requests or questions.  Due to long hold times on the telephone, sending your provider a message by [facility] may be a faster and more efficient way to get a response.  Please allow 48 business hours for a response.  Please remember that this is for non-urgent requests.  _______________________________________________________  I appreciate the opportunity to care for you. Stan Head, MD, [facility]

## 2023-01-14 ENCOUNTER — Encounter: Payer: Self-pay | Admitting: Internal Medicine

## 2023-01-14 ENCOUNTER — Telehealth: Payer: Self-pay | Admitting: Nutrition

## 2023-01-14 ENCOUNTER — Encounter: Payer: 59 | Attending: Internal Medicine | Admitting: Nutrition

## 2023-01-14 DIAGNOSIS — E1165 Type 2 diabetes mellitus with hyperglycemia: Secondary | ICD-10-CM | POA: Insufficient documentation

## 2023-01-14 DIAGNOSIS — Z713 Dietary counseling and surveillance: Secondary | ICD-10-CM | POA: Diagnosis not present

## 2023-01-14 DIAGNOSIS — Z794 Long term (current) use of insulin: Secondary | ICD-10-CM | POA: Diagnosis not present

## 2023-01-14 DIAGNOSIS — Z9641 Presence of insulin pump (external) (internal): Secondary | ICD-10-CM | POA: Insufficient documentation

## 2023-01-14 DIAGNOSIS — E1142 Type 2 diabetes mellitus with diabetic polyneuropathy: Secondary | ICD-10-CM

## 2023-01-14 NOTE — Telephone Encounter (Signed)
Patient came into the office saying that her blood sugars are dropping a lot in the afternoons and early evening.  Pump was downloaded and put in Dr. Harvel Ricks desk.

## 2023-01-14 NOTE — Telephone Encounter (Signed)
Patient was talked into making the changes to her basal setting at 12 noon to 1.60

## 2023-01-14 NOTE — Progress Notes (Signed)
Patient called me saying that she put the settings into her new pdm, and that her blood sugars are dropping low.  I suggested she come in for me to recheck the settings and to download her Pump for the doctor to view.  One of her basal rates were not set correctly: MN-4AM was set to 1.25 in stead of 1.30.  All other settings were done correctly.  She reports that her blood sugars have been dropping in the afternoons and early evenings.  Pump was downloaded and put on Dr. Harvel Ricks desk.

## 2023-01-15 ENCOUNTER — Encounter: Payer: Self-pay | Admitting: Internal Medicine

## 2023-02-01 DIAGNOSIS — E1149 Type 2 diabetes mellitus with other diabetic neurological complication: Secondary | ICD-10-CM | POA: Diagnosis not present

## 2023-02-01 DIAGNOSIS — E1165 Type 2 diabetes mellitus with hyperglycemia: Secondary | ICD-10-CM | POA: Diagnosis not present

## 2023-02-17 ENCOUNTER — Emergency Department (HOSPITAL_COMMUNITY)
Admission: EM | Admit: 2023-02-17 | Discharge: 2023-02-17 | Disposition: A | Discharge status: Home / Self Care | Payer: 59 | Attending: Emergency Medicine | Admitting: Emergency Medicine

## 2023-02-17 ENCOUNTER — Encounter (HOSPITAL_COMMUNITY): Payer: Self-pay | Admitting: Emergency Medicine

## 2023-02-17 ENCOUNTER — Other Ambulatory Visit: Payer: Self-pay

## 2023-02-17 ENCOUNTER — Ambulatory Visit: Payer: Self-pay | Admitting: *Deleted

## 2023-02-17 ENCOUNTER — Emergency Department (HOSPITAL_COMMUNITY): Payer: 59

## 2023-02-17 DIAGNOSIS — R531 Weakness: Secondary | ICD-10-CM

## 2023-02-17 DIAGNOSIS — I10 Essential (primary) hypertension: Secondary | ICD-10-CM | POA: Diagnosis not present

## 2023-02-17 DIAGNOSIS — Z79899 Other long term (current) drug therapy: Secondary | ICD-10-CM | POA: Insufficient documentation

## 2023-02-17 DIAGNOSIS — R519 Headache, unspecified: Secondary | ICD-10-CM | POA: Diagnosis present

## 2023-02-17 DIAGNOSIS — Z7982 Long term (current) use of aspirin: Secondary | ICD-10-CM | POA: Insufficient documentation

## 2023-02-17 DIAGNOSIS — G4489 Other headache syndrome: Secondary | ICD-10-CM | POA: Diagnosis not present

## 2023-02-17 DIAGNOSIS — Z743 Need for continuous supervision: Secondary | ICD-10-CM | POA: Diagnosis not present

## 2023-02-17 DIAGNOSIS — N390 Urinary tract infection, site not specified: Secondary | ICD-10-CM | POA: Insufficient documentation

## 2023-02-17 DIAGNOSIS — R404 Transient alteration of awareness: Secondary | ICD-10-CM | POA: Diagnosis not present

## 2023-02-17 DIAGNOSIS — G459 Transient cerebral ischemic attack, unspecified: Secondary | ICD-10-CM | POA: Diagnosis not present

## 2023-02-17 DIAGNOSIS — R030 Elevated blood-pressure reading, without diagnosis of hypertension: Secondary | ICD-10-CM

## 2023-02-17 DIAGNOSIS — R6889 Other general symptoms and signs: Secondary | ICD-10-CM | POA: Diagnosis not present

## 2023-02-17 LAB — COMPREHENSIVE METABOLIC PANEL
ALT: 20 U/L (ref 0–44)
AST: 22 U/L (ref 15–41)
Albumin: 3.5 g/dL (ref 3.5–5.0)
Alkaline Phosphatase: 53 U/L (ref 38–126)
Anion gap: 6 (ref 5–15)
BUN: 8 mg/dL (ref 8–23)
CO2: 25 mmol/L (ref 22–32)
Calcium: 9.2 mg/dL (ref 8.9–10.3)
Chloride: 109 mmol/L (ref 98–111)
Creatinine, Ser: 0.68 mg/dL (ref 0.44–1.00)
GFR, Estimated: >60 mL/min (ref >60)
Glucose, Bld: 105 mg/dL — ABNORMAL HIGH (ref 70–99)
Potassium: 3.8 mmol/L (ref 3.5–5.1)
Sodium: 140 mmol/L (ref 135–145)
Total Bilirubin: 1.2 mg/dL (ref 0.3–1.2)
Total Protein: 6.2 g/dL — ABNORMAL LOW (ref 6.5–8.1)

## 2023-02-17 LAB — I-STAT CHEM 8, ED
BUN: 9 mg/dL (ref 8–23)
Calcium, Ion: 1.18 mmol/L (ref 1.15–1.40)
Chloride: 108 mmol/L (ref 98–111)
Creatinine, Ser: 0.7 mg/dL (ref 0.44–1.00)
Glucose, Bld: 103 mg/dL — ABNORMAL HIGH (ref 70–99)
HCT: 36 % (ref 36.0–46.0)
Hemoglobin: 12.2 g/dL (ref 12.0–15.0)
Potassium: 3.9 mmol/L (ref 3.5–5.1)
Sodium: 143 mmol/L (ref 135–145)
TCO2: 24 mmol/L (ref 22–32)

## 2023-02-17 LAB — URINALYSIS, ROUTINE W REFLEX MICROSCOPIC
Bilirubin Urine: NEGATIVE
Glucose, UA: >=500 mg/dL — AB
Hgb urine dipstick: NEGATIVE
Ketones, ur: NEGATIVE mg/dL
Nitrite: POSITIVE — AB
Protein, ur: 30 mg/dL — AB
Specific Gravity, Urine: 1.023 (ref 1.005–1.030)
WBC, UA: >50 WBC/hpf (ref 0–5)
pH: 5 (ref 5.0–8.0)

## 2023-02-17 LAB — CBC WITH DIFFERENTIAL/PLATELET
Abs Immature Granulocytes: 0.01 10*3/uL (ref 0.00–0.07)
Basophils Absolute: 0.1 10*3/uL (ref 0.0–0.1)
Basophils Relative: 1 %
Eosinophils Absolute: 0.1 10*3/uL (ref 0.0–0.5)
Eosinophils Relative: 2 %
HCT: 37.9 % (ref 36.0–46.0)
Hemoglobin: 11.7 g/dL — ABNORMAL LOW (ref 12.0–15.0)
Immature Granulocytes: 0 %
Lymphocytes Relative: 37 %
Lymphs Abs: 1.5 10*3/uL (ref 0.7–4.0)
MCH: 27.1 pg (ref 26.0–34.0)
MCHC: 30.9 g/dL (ref 30.0–36.0)
MCV: 87.9 fL (ref 80.0–100.0)
Monocytes Absolute: 0.4 10*3/uL (ref 0.1–1.0)
Monocytes Relative: 10 %
Neutro Abs: 2 10*3/uL (ref 1.7–7.7)
Neutrophils Relative %: 50 %
Platelets: 220 10*3/uL (ref 150–400)
RBC: 4.31 MIL/uL (ref 3.87–5.11)
RDW: 14.6 % (ref 11.5–15.5)
WBC: 4.1 10*3/uL (ref 4.0–10.5)
nRBC: 0 % (ref 0.0–0.2)

## 2023-02-17 LAB — ETHANOL: Alcohol, Ethyl (B): <10 mg/dL (ref <10)

## 2023-02-17 MED ORDER — CEPHALEXIN 500 MG PO CAPS: 500.0000 mg | ORAL_CAPSULE | Freq: Four times a day (QID) | ORAL | 0 refills | Status: DC

## 2023-02-17 MED ORDER — ACETAMINOPHEN 500 MG PO TABS
1000.0000 mg | ORAL_TABLET | Freq: Once | ORAL | Status: AC
  Administered 2023-02-17: 1000 mg via ORAL
  Filled 2023-02-17: qty 2

## 2023-02-17 MED ORDER — IBUPROFEN 400 MG PO TABS
400.0000 mg | ORAL_TABLET | Freq: Once | ORAL | Status: AC
  Administered 2023-02-17: 400 mg via ORAL
  Filled 2023-02-17: qty 1

## 2023-02-17 MED ORDER — ONDANSETRON 4 MG PO TBDP
8.0000 mg | ORAL_TABLET | Freq: Once | ORAL | Status: AC
  Administered 2023-02-17: 8 mg via ORAL
  Filled 2023-02-17: qty 2

## 2023-02-17 MED ORDER — SODIUM CHLORIDE 0.9 % IV SOLN
1.0000 g | Freq: Once | INTRAVENOUS | Status: AC
  Administered 2023-02-17: 1 g via INTRAVENOUS
  Filled 2023-02-17: qty 10

## 2023-02-17 NOTE — Patient Outreach (Addendum)
Care Coordination   Follow Up Visit Note   02/17/2023 Name: TASHEIA GILE MRN: [id number] DOB: [birth year]  [name] is a 67 y.o. year old female who sees [place], Janyth Pupa, [place] for primary care. I spoke with  [name] by phone today.  What matters to the patients health and wellness today?  She reports she is in the bed and is not feeling well. Reporting  transient ischemic attack (TIA) symptoms x 2 on 02/16/23 with blurred vision, nodding, face droop, weakness/barely able to stand, changes in speech, nausea Headache Reports symptoms started on Friday, 02/14/23 Past medical history of stroke (PM Hx of multiple CVAs (2009, 2010, 2011) without residual deficit- neuro consult 03/01/22 [facility] ), heart attack  She reports having these feeling in the past   Work stress- patient shared episodes of intimidation, exclusion but being allowed to work over time Patient able to ventilate her feelings Reports no employee assistance services offered, no present counselor   Diabetes- cbg values increasing 243   Goals Addressed             This Visit's Progress    Manage TIA symptoms History of stroke, work stress care coordination services   Not on track    Interventions Today    Flowsheet Row Most Recent Value  Chronic Disease   Chronic disease during today's visit Other  [TIAs/work]  General Interventions   General Interventions Discussed/Reviewed General Interventions Reviewed  Doctor Visits Discussed/Reviewed Doctor Visits Reviewed, Specialist, PCP  PCP/Specialist Visits Compliance with follow-up visit  Communication with PCP/Specialists  [conference call with patient to her pcp office as it is near her. Spoke with Annetta Maw to inquire about a virtual visit. An in person visit with noted and given to patient for 1 pm today]  Education Interventions   Education Provided Provided Education  [risk of TIA leading to strokes, Employee assistance services, counseling, Encouraged  to seek medical services]  Provided Verbal Education On Other, Sick Day Rules, Mental Health/Coping with Illness  Mental Health Interventions   Mental Health Discussed/Reviewed Mental Health Reviewed, Coping Strategies, Other  [work stress]              SDOH assessments and interventions completed:  Yes  SDOH Interventions Today    Flowsheet Row Most Recent Value  SDOH Interventions   Stress Interventions Provide Counseling, Other (Comment)  [encouraged to seek medical care, inquire about work employee assistance program]  Health Literacy Interventions Intervention Not Indicated        Care Coordination Interventions:  Yes, provided   Follow up plan: Follow up call scheduled for 02/19/23    Encounter Outcome:  Patient Visit Completed   Yarn L. Noelle Penner, RN, BSN, [facility]  VBCI Care Management Coordinator  [phone]  Fax: [phone]

## 2023-02-17 NOTE — Discharge Instructions (Signed)
It was our pleasure to provide your ER care today - we hope that you feel better.  Drink plenty of fluids/stay well hydrated.  Your lab tests show a urine infection - take antibiotic (keflex) as prescribed.   Follow up with primary care doctor in the coming week for recheck - also have your blood pressure rechecked then as it is high today.  Return to ER if worse, new symptoms, fevers, new/severe pain, weak/fainting, chest pain, trouble breathing, severe abdominal pain, persistent vomiting, new numbness/weakness, or other concern.

## 2023-02-17 NOTE — ED Notes (Signed)
Pt handled water fine.

## 2023-02-17 NOTE — ED Provider Notes (Signed)
[facility] EMERGENCY DEPARTMENT AT [facility] Provider Note   CSN: [id number] Arrival date & time: 02/17/23  1408     History  Chief Complaint  Patient presents with   Dizziness   Headache    [name] is a 67 y.o. female.  Pt with c/o generalized weakness. States a few days ago had sense of blurry vision, then went to church yesterday and felt tired/drowsy, and felt generally weak when got home. Also c/o intermittent frontal headache, dull, throbbing, hx migraines. Similar headache in past. No acute, abrupt or severe head pain. Denies focal or unilateral numbness/weakness. No amaurosis, visual field cut, or other specific visual symptoms currently. No change in speech. No problems w balance or coordination. No syncope. W headache, no neck pain or stiffness. No eye pain. No fever/chills.   The history is provided by the patient, medical records and the EMS personnel.  Dizziness Associated symptoms: headaches   Associated symptoms: no chest pain, no diarrhea, no palpitations, no shortness of breath and no vomiting   Headache Associated symptoms: dizziness   Associated symptoms: no abdominal pain, no back pain, no cough, no diarrhea, no eye pain, no fever, no neck pain, no numbness, no photophobia, no sinus pressure, no sore throat and no vomiting        Home Medications Prior to Admission medications   Medication Sig Start Date End Date Taking? Authorizing Provider  acetaminophen (TYLENOL) 325 MG tablet Take 2 tablets (650 mg total) by mouth every 6 (six) hours as needed for mild pain (or Fever >/= 101). 07/02/19   Emokpae, Courage, MD  albuterol (PROAIR HFA) 108 (90 Base) MCG/ACT inhaler 1-2 inhalations every 4-6 hours as needed for cough or wheeze. Patient taking differently: Inhale 1-2 puffs into the lungs every 4 (four) hours as needed for shortness of breath. 07/10/15   Bobbitt, Heywood Iles, MD  ALPRAZolam Prudy Feeler) 1 MG tablet Take 1 mg by mouth 2 (two) times  daily as needed for anxiety.    [provider]  aspirin EC 81 MG tablet Take 81 mg by mouth daily.    [provider]  Azelastine HCl 0.15 % SOLN Place 2 sprays into both nostrils 2 (two) times daily. Patient taking differently: Place 2 sprays into both nostrils daily as needed (allergies). 07/10/15   Bobbitt, Heywood Iles, MD  bisacodyl (DULCOLAX) 5 MG EC tablet Take 5 mg by mouth daily as needed for moderate constipation. 08/19/19   Iva Boop, MD  clopidogrel (PLAVIX) 75 MG tablet Take 1 tablet (75 mg total) by mouth daily. 10/01/22   Nada Libman, MD  Continuous Blood Gluc Receiver (DEXCOM G6 RECEIVER) DEVI by Does not apply route.    [provider]  Continuous Glucose Sensor (DEXCOM G7 SENSOR) MISC 1 Device by Does not apply route as directed. 11/29/22   [name], Konrad Dolores, MD  doxycycline (VIBRAMYCIN) 100 MG capsule Take 1 capsule (100 mg total) by mouth 2 (two) times daily. Patient taking differently: Take 100 mg by mouth as needed (Infection). 02/20/21   Mardella Layman, MD  empagliflozin (JARDIANCE) 25 MG TABS tablet Take 1 tablet (25 mg total) by mouth daily before breakfast. 05/27/22   [name], Konrad Dolores, MD  EPINEPHrine 0.3 mg/0.3 mL IJ SOAJ injection Inject 0.3 mLs (0.3 mg total) into the muscle once. 08/02/15   Bobbitt, Heywood Iles, MD  hydrocortisone (ANUSOL-HC) 2.5 % rectal cream Place rectally daily as needed for hemorrhoids. 09/16/22   [provider]  Insulin Disposable Pump (OMNIPOD 5 G7 INTRO, GEN 5,) KIT 1 Device by Does not apply route every other day. 11/29/22   [name], Konrad Dolores, MD  Insulin Disposable Pump (OMNIPOD 5 G7 PODS, GEN 5,) MISC 1 Device by Does not apply route every other day. 11/29/22   [name], Konrad Dolores, MD  insulin lispro (HUMALOG) 100 UNIT/ML injection Max daily 60 units 11/29/22   [name], Konrad Dolores, MD  losartan-hydrochlorothiazide (HYZAAR) 50-12.5 MG tablet Take 1 tablet by  mouth daily. 08/18/19   Jodelle Gross, NP  lubiprostone (AMITIZA) 8 MCG capsule Take 1 capsule (8 mcg total) by mouth 2 (two) times daily with a meal. 01/09/23   Iva Boop, MD  Magnesium 200 MG TABS Take 1 tablet (200 mg total) by mouth daily. Patient taking differently: Take 200 mg by mouth 2 (two) times daily. 09/17/22   Iva Boop, MD  montelukast (SINGULAIR) 10 MG tablet Take 10 mg by mouth at bedtime as needed (allergies). 02/22/22   [provider]  mupirocin ointment (BACTROBAN) 2 % Apply 1 application topically 2 (two) times daily. Patient taking differently: Apply 1 application  topically 2 (two) times daily as needed. 06/02/19   Wurst, [place], PA-C  nitroGLYCERIN (NITROSTAT) 0.4 MG SL tablet PLACE 1 TAB UNDER TONGUE EVERY 5 MINS AS NEEDED FOR CHEST PAIN - MAX 3 DOSES THEN 911 12/06/20   Runell Gess, MD  nortriptyline (PAMELOR) 10 MG capsule Take 1 capsule (10 mg total) by mouth at bedtime. Patient taking differently: Take 10 mg by mouth at bedtime as needed for sleep. 09/17/22   Iva Boop, MD  pantoprazole (PROTONIX) 40 MG tablet TAKE 1 TABLET BY MOUTH EVERY DAY Patient taking differently: Take 40 mg by mouth daily. 01/07/17   Runell Gess, MD  polyethylene glycol (MIRALAX / GLYCOLAX) 17 g packet Take 17 g by mouth daily as needed for moderate constipation or severe constipation. 08/19/19   Iva Boop, MD  potassium chloride (KLOR-CON) 10 MEQ tablet Take 10 mEq by mouth 2 (two) times daily. 10/01/19   [provider]  rosuvastatin (CRESTOR) 40 MG tablet Take 1 tablet (40 mg total) by mouth daily. 05/27/22   [name], Konrad Dolores, MD  SUMAtriptan (IMITREX) 100 MG tablet Take 100 mg by mouth every 2 (two) hours as needed for migraine.     [provider]      Allergies    Sulfa antibiotics, Codeine, Fish allergy, Iodine, Metformin and related, Soliqua [insulin glargine-lixisenatide], Shellfish allergy, and Tape    Review of  Systems   Review of Systems  Constitutional:  Negative for chills and fever.  HENT:  Negative for rhinorrhea, sinus pressure and sore throat.   Eyes:  Negative for photophobia, pain and redness.  Respiratory:  Negative for cough and shortness of breath.   Cardiovascular:  Negative for chest pain, palpitations and leg swelling.  Gastrointestinal:  Negative for abdominal pain, diarrhea and vomiting.  Genitourinary:  Negative for dysuria and flank pain.  Musculoskeletal:  Negative for back pain and neck pain.  Skin:  Negative for rash.  Neurological:  Positive for dizziness, light-headedness and headaches. Negative for syncope, speech difficulty and numbness.  Hematological:  Does not bruise/bleed easily.  Psychiatric/Behavioral:  Negative for confusion.     Physical Exam Updated Vital Signs BP (!) 159/73   Pulse 65   Temp 98.2 F (36.8 C) (Oral)   Resp 19   SpO2 100%  Physical Exam Vitals and nursing note  reviewed.  Constitutional:      Appearance: Normal appearance. She is well-developed.  HENT:     Head: Atraumatic.     Comments: No sinus or temporal tenderness.     Nose: Nose normal.     Mouth/Throat:     Mouth: Mucous membranes are moist.  Eyes:     General: No scleral icterus.    Extraocular Movements: Extraocular movements intact.     Conjunctiva/sclera: Conjunctivae normal.     Pupils: Pupils are equal, round, and reactive to light.  Neck:     Vascular: No carotid bruit.     Trachea: No tracheal deviation.     Comments: No stiffness or rigidity. Trachea midline. Thyroid not grossly enlarged or tender.  Cardiovascular:     Rate and Rhythm: Normal rate and regular rhythm.     Pulses: Normal pulses.     Heart sounds: Normal heart sounds. No murmur heard.    No friction rub. No gallop.  Pulmonary:     Effort: Pulmonary effort is normal. No respiratory distress.     Breath sounds: Normal breath sounds.  Abdominal:     General: Bowel sounds are normal. There is no  distension.     Palpations: Abdomen is soft. There is no mass.     Tenderness: There is no abdominal tenderness. There is no guarding.  Genitourinary:    Comments: No cva tenderness.  Musculoskeletal:        General: No swelling or tenderness.     Cervical back: Normal range of motion and neck supple. No rigidity or tenderness. No muscular tenderness.     Right lower leg: No edema.     Left lower leg: No edema.  Skin:    General: Skin is warm and dry.     Findings: No rash.  Neurological:     Mental Status: She is alert.     Comments: Alert, speech normal. Motor/sens grossly intact bil. Stre 5/5 bil. No pronator drift. Sens grossly intact. Steady gait. No ataxia.   Psychiatric:        Mood and Affect: Mood normal.     ED Results / Procedures / Treatments   Labs (all labs ordered are listed, but only abnormal results are displayed) Results for orders placed or performed during the hospital encounter of 02/17/23  Comprehensive metabolic panel  Result Value Ref Range   Sodium 140 135 - 145 mmol/L   Potassium 3.8 3.5 - 5.1 mmol/L   Chloride 109 98 - 111 mmol/L   CO2 25 22 - 32 mmol/L   Glucose, Bld 105 (H) 70 - 99 mg/dL   BUN 8 8 - 23 mg/dL   Creatinine, Ser 1.61 0.44 - 1.00 mg/dL   Calcium 9.2 8.9 - 09.6 mg/dL   Total Protein 6.2 (L) 6.5 - 8.1 g/dL   Albumin 3.5 3.5 - 5.0 g/dL   AST 22 15 - 41 U/L   ALT 20 0 - 44 U/L   Alkaline Phosphatase 53 38 - 126 U/L   Total Bilirubin 1.2 0.3 - 1.2 mg/dL   GFR, Estimated >04 >54 mL/min   Anion gap 6 5 - 15  CBC with Differential  Result Value Ref Range   WBC 4.1 4.0 - 10.5 K/uL   RBC 4.31 3.87 - 5.11 MIL/uL   Hemoglobin 11.7 (L) 12.0 - 15.0 g/dL   HCT 09.8 11.9 - 14.7 %   MCV 87.9 80.0 - 100.0 fL   MCH 27.1 26.0 - 34.0 pg  MCHC 30.9 30.0 - 36.0 g/dL   RDW 30.8 65.7 - 84.6 %   Platelets 220 150 - 400 K/uL   nRBC 0.0 0.0 - 0.2 %   Neutrophils Relative % 50 %   Neutro Abs 2.0 1.7 - 7.7 K/uL   Lymphocytes Relative 37 %    Lymphs Abs 1.5 0.7 - 4.0 K/uL   Monocytes Relative 10 %   Monocytes Absolute 0.4 0.1 - 1.0 K/uL   Eosinophils Relative 2 %   Eosinophils Absolute 0.1 0.0 - 0.5 K/uL   Basophils Relative 1 %   Basophils Absolute 0.1 0.0 - 0.1 K/uL   Immature Granulocytes 0 %   Abs Immature Granulocytes 0.01 0.00 - 0.07 K/uL  Ethanol  Result Value Ref Range   Alcohol, Ethyl (B) <10 <10 mg/dL  Urinalysis, Routine w reflex microscopic -Urine, Clean Catch  Result Value Ref Range   Color, Urine YELLOW YELLOW   APPearance HAZY (A) CLEAR   Specific Gravity, Urine 1.023 1.005 - 1.030   pH 5.0 5.0 - 8.0   Glucose, UA >=500 (A) NEGATIVE mg/dL   Hgb urine dipstick NEGATIVE NEGATIVE   Bilirubin Urine NEGATIVE NEGATIVE   Ketones, ur NEGATIVE NEGATIVE mg/dL   Protein, ur 30 (A) NEGATIVE mg/dL   Nitrite POSITIVE (A) NEGATIVE   Leukocytes,Ua LARGE (A) NEGATIVE   RBC / HPF 0-5 0 - 5 RBC/hpf   WBC, UA >50 0 - 5 WBC/hpf   Bacteria, UA FEW (A) NONE SEEN   Squamous Epithelial / HPF 0-5 0 - 5 /HPF   Mucus PRESENT   I-stat chem 8, ED  Result Value Ref Range   Sodium 143 135 - 145 mmol/L   Potassium 3.9 3.5 - 5.1 mmol/L   Chloride 108 98 - 111 mmol/L   BUN 9 8 - 23 mg/dL   Creatinine, Ser 9.62 0.44 - 1.00 mg/dL   Glucose, Bld 952 (H) 70 - 99 mg/dL   Calcium, Ion 8.41 3.24 - 1.40 mmol/L   TCO2 24 22 - 32 mmol/L   Hemoglobin 12.2 12.0 - 15.0 g/dL   HCT 40.1 02.7 - 25.3 %      EKG EKG Interpretation Date/Time:  Monday February 17 2023 14:01:28 EDT Ventricular Rate:  65 PR Interval:  138 QRS Duration:  90 QT Interval:  390 QTC Calculation: 405 R Axis:   7  Text Interpretation: Sinus rhythm with Premature atrial complexes Confirmed by Cathren Laine ([id number]) on 02/17/2023 4:08:19 PM  Radiology CT Head Wo Contrast  Result Date: 02/17/2023 CLINICAL DATA:  Mental status change, unknown cause EXAM: CT HEAD WITHOUT CONTRAST TECHNIQUE: Contiguous axial images were obtained from the base of the skull through  the vertex without intravenous contrast. RADIATION DOSE REDUCTION: This exam was performed according to the departmental dose-optimization program which includes automated exposure control, adjustment of the mA and/or kV according to patient size and/or use of iterative reconstruction technique. COMPARISON:  03/01/2022 FINDINGS: Brain: No acute intracranial abnormality. Specifically, no hemorrhage, hydrocephalus, mass lesion, acute infarction, or significant intracranial injury. Vascular: No hyperdense vessel or unexpected calcification. Skull: No acute calvarial abnormality. Sinuses/Orbits: No acute findings Other: None IMPRESSION: No acute intracranial abnormality. Electronically Signed   By: Charlett Nose M.D.   On: 02/17/2023 20:25    Procedures Procedures    Medications Ordered in ED Medications  acetaminophen (TYLENOL) tablet 1,000 mg (1,000 mg Oral Given 02/17/23 1515)  ibuprofen (ADVIL) tablet 400 mg (400 mg Oral Given 02/17/23 1635)  ondansetron (ZOFRAN-ODT) disintegrating tablet 8  mg (8 mg Oral Given 02/17/23 1944)    ED Course/ Medical Decision Making/ A&P                                 Medical Decision Making Problems Addressed: Acute UTI: acute illness or injury with systemic symptoms that poses a threat to life or bodily functions Elevated blood pressure reading: acute illness or injury Essential hypertension: chronic illness or injury with exacerbation, progression, or side effects of treatment that poses a threat to life or bodily functions Generalized weakness: acute illness or injury with systemic symptoms that poses a threat to life or bodily functions  Amount and/or Complexity of Data Reviewed Independent Historian: EMS    Details: hx External Data Reviewed: notes. Labs: ordered. Decision-making details documented in ED Course. Radiology: ordered and independent interpretation performed. Decision-making details documented in ED Course. ECG/medicine tests: ordered and  independent interpretation performed. Decision-making details documented in ED Course.  Risk Prescription drug management. Decision regarding hospitalization.   Iv ns. Continuous pulse ox and cardiac monitoring. Labs ordered/sent. Imaging ordered.   Differential diagnosis includes anemia, dehydration, uti, etc. Dispo decision including potential need for admission considered - will get labs and imaging and reassess.   Reviewed nursing notes and prior charts for additional history. External reports reviewed. Additional history from: EMS.   Cardiac monitor: sinus rhythm, rate 66.  Labs reviewed/interpreted by me - wbc and hct normal. Chem unremarkable. UA c/w uti. Rocephin iv.   Po fluids/food. Ambulate in hall. No faintness or dizziness. No imbalance or ataxia. No nv.   CT reviewed/interpreted by me - no hem.   Pt currently appears stable for d/c.   Rec close pcp f/u.  Return precautions provided.          Final Clinical Impression(s) / ED Diagnoses Final diagnoses:  None    Rx / DC Orders ED Discharge Orders     None         Cathren Laine, MD 02/17/23 2057

## 2023-02-17 NOTE — ED Notes (Signed)
Patient verbalizes understanding of discharge instructions. Opportunity for questioning and answers were provided. Armband removed by staff, pt discharged from ED. Pt taken to ED waiting room via wheel chair.  

## 2023-02-17 NOTE — ED Triage Notes (Signed)
Patient BIB EMS  with poss TIA, Headache, and dizziness since thursday .stroke screen neg. CBG 158, 154/69, 82, 60. 20gLAC

## 2023-02-17 NOTE — Patient Instructions (Addendum)
Visit Information  Thank you for taking time to visit with me today. Please don't hesitate to contact me if I can be of assistance to you.   Following are the goals we discussed today:   Goals Addressed             This Visit's Progress    Manage TIA symptoms History of stroke, work stress care coordination services   Not on track    Interventions Today    Flowsheet Row Most Recent Value  Chronic Disease   Chronic disease during today's visit Other  [TIAs/work]  General Interventions   General Interventions Discussed/Reviewed General Interventions Reviewed  Doctor Visits Discussed/Reviewed Doctor Visits Reviewed, Specialist, PCP  PCP/Specialist Visits Compliance with follow-up visit  Communication with PCP/Specialists  [conference call with patient to her pcp office as it is near her. Spoke with Annetta Maw to inquire about a virtual visit. An in person visit with noted and given to patient for 1 pm today]  Education Interventions   Education Provided Provided Education  [risk of TIA leading to strokes, Employee assistance services, counseling, Encouraged to seek medical services]  Provided Verbal Education On Other, Sick Day Rules, Mental Health/Coping with Illness  Mental Health Interventions   Mental Health Discussed/Reviewed Mental Health Reviewed, Coping Strategies, Other  [work stress]              Our next appointment is by telephone on 02/19/23 at 1130  Please call the care guide team at [phone] if you need to cancel or reschedule your appointment.   If you are experiencing a Mental Health or Behavioral Health Crisis or need someone to talk to, please call the Suicide and Crisis Lifeline: 988 call the [place] National Suicide Prevention Lifeline: 713-211-9028 or TTY: (514)849-8012 TTY 910 703 7535) to talk to a trained counselor call 1-800-273-TALK (toll free, 24 hour hotline) go to [facility] [address] [place]  [phone]) call 911   Patient verbalizes understanding of instructions and care plan provided today and agrees to view in MyChart. Active MyChart status and patient understanding of how to access instructions and care plan via MyChart confirmed with patient.     The patient has been provided with contact information for the care management team and has been advised to call with any health related questions or concerns.   Dashay Giesler L. Noelle Penner, RN, BSN, [facility]  VBCI Care Management Coordinator  [phone]  Fax: [phone]

## 2023-02-17 NOTE — ED Provider Triage Note (Cosign Needed Addendum)
Emergency Medicine Provider Triage Evaluation Note  [name] , a 67 y.o. female  was evaluated in triage.  Pt complains of dizziness, left sided headache since Thursday.  History of TIA.  Patient takes ASA and Plavix.  Describes headache as throbbing, sharp pain.  She also has blurred vision, nausea and generalized weakness with fatigue.  She reports facial droop with changes in speech.  Symptoms have been progressively worsening.   Review of Systems  Positive: As above Negative: As above  Physical Exam  There were no vitals taken for this visit. Gen:   Awake, no distress   Resp:  Normal effort  MSK:   Moves extremities without difficulty  Other:  Speech is clear, no facial droop.  No pronator drift.  Grip strength equal bilaterally.  Gait not assessed.  EOM intact without obvious nystagmus.   Medical Decision Making  Medically screening exam initiated at 2:37 PM.  Appropriate orders placed.  [name] was informed that the remainder of the evaluation will be completed by another provider, this initial triage assessment does not replace that evaluation, and the importance of remaining in the ED until their evaluation is complete.  Patient is outside of stroke window.  Signs and symptoms concerning for TIA vs central vertigo vs complex migraine.   Lenard Simmer, PA-C 02/17/23 1439    Lenard Simmer, PA-C 02/17/23 1447

## 2023-02-17 NOTE — ED Notes (Signed)
Pt ambulated to restroom with steady gait to collect urine sample.

## 2023-02-19 ENCOUNTER — Ambulatory Visit: Payer: Self-pay | Admitting: *Deleted

## 2023-02-19 NOTE — Patient Outreach (Signed)
Care Coordination   02/19/2023 Name: [name] MRN: [id number] DOB: 03/05/1956   Care Coordination Outreach Attempts:  An unsuccessful telephone outreach was attempted today to offer the patient information about available care coordination services.  Follow Up Plan:  Additional outreach attempts will be made to offer the patient care coordination information and services.   Encounter Outcome:  No Answer   Care Coordination Interventions:  No, not indicated    Leilene Diprima L. Noelle Penner, RN, BSN, [facility]  VBCI Care Management Coordinator  [phone]  Fax: [phone]

## 2023-02-19 NOTE — Patient Outreach (Signed)
Care Coordination   Follow Up Visit Note   02/19/2023 Name: [name] MRN: [id number] DOB: 02-Jan-1956  [name] is a 67 y.o. year old female who sees [place], Janyth Pupa, [place] for primary care. I spoke with  [name] by phone today. Patient returned a call to RN CM   What matters to the patients health and wellness today?  Continuing to maintain her health and decrease work related stress that causes worsening symptoms Completed her visits to her pcp and ED on 02/18/23 Returned to work on 02/19/23 after cleared by ED MD and provided her employer her work note on  02/18/23.  Today she is managing well and appreciates encouragement.    Goals Addressed             This Visit's Progress    Managing worsening medical symptoms related to workplace stress- care coordination services       Patient will continue to perform well at work and speak with leadership about her concerns Patient will manage medical diagnoses as ordered   Interventions Today    Flowsheet Row Most Recent Value  Chronic Disease   Chronic disease during today's visit Other  [follow up after pcp & ED visit. She is continuing to work with leadership staff.]  General Interventions   General Interventions Discussed/Reviewed General Interventions Reviewed, Doctor Visits  Doctor Visits Discussed/Reviewed Doctor Visits Reviewed, PCP, Specialist  Education Interventions   Education Provided Provided Education  Michel Santee, faith, work performance]  Provided Verbal Education On Mental Health/Coping with Illness, Other  Mental Health Interventions   Mental Health Discussed/Reviewed Mental Health Reviewed, Coping Strategies, Other  [work place stress]              SDOH assessments and interventions completed:  No    Care Coordination Interventions:  Yes, provided   Follow up plan: Follow up call scheduled for 03/04/23    Encounter Outcome:  Patient Visit Completed   Digenova L. Noelle Penner, RN, BSN, [facility]   VBCI Care Management Coordinator  [phone]  Fax: [phone]

## 2023-02-19 NOTE — Patient Instructions (Signed)
Visit Information  Thank you for taking time to visit with me today. Please don't hesitate to contact me if I can be of assistance to you.   Following are the goals we discussed today:   Goals Addressed             This Visit's Progress    Managing worsening medical symptoms related to workplace stress- care coordination services       Patient will continue to perform well at work and speak with leadership about her concerns Patient will manage medical diagnoses as ordered   Interventions Today    Flowsheet Row Most Recent Value  Chronic Disease   Chronic disease during today's visit Other  [follow up after pcp & ED visit. She is continuing to work with leadership staff.]  General Interventions   General Interventions Discussed/Reviewed General Interventions Reviewed, Doctor Visits  Doctor Visits Discussed/Reviewed Doctor Visits Reviewed, PCP, Specialist  Education Interventions   Education Provided Provided Education  Michel Santee, faith, work performance]  Provided Verbal Education On Mental Health/Coping with Illness, Other  Mental Health Interventions   Mental Health Discussed/Reviewed Mental Health Reviewed, Coping Strategies, Other  [work place stress]              Our next appointment is by telephone on 03/04/23 at 3:30 pm  Please call the care guide team at [phone] if you need to cancel or reschedule your appointment.   If you are experiencing a Mental Health or Behavioral Health Crisis or need someone to talk to, please call the Suicide and Crisis Lifeline: 988 call the [place] National Suicide Prevention Lifeline: 445-535-3665 or TTY: (604)841-6219 TTY 450-246-3567) to talk to a trained counselor call 1-800-273-TALK (toll free, 24 hour hotline) call the [facility]: [phone] call 911   Patient verbalizes understanding of instructions and care plan provided today and agrees to view in MyChart. Active MyChart status and patient  understanding of how to access instructions and care plan via MyChart confirmed with patient.     The patient has been provided with contact information for the care management team and has been advised to call with any health related questions or concerns.   Bland Rudzinski L. Noelle Penner, RN, BSN, [facility]  VBCI Care Management Coordinator  [phone]  Fax: [phone]

## 2023-02-27 ENCOUNTER — Telehealth: Payer: Self-pay

## 2023-02-27 NOTE — Telephone Encounter (Signed)
Pt called c/o burning sensations in her leg. She is going out of town tomorrow at 6 AM.  Reviewed pt's chart, returned call for clarification, two identifiers used. Pt stated that she had been moved at work to a space that has lots of boxes under the desk, so she is unable to spread out comfortably. Ever since, she's had some numbness and burning. She has been wearing compressions.  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 She will monitor and call when she returns if she still needs an appt. Confirmed understanding.

## 2023-03-04 ENCOUNTER — Ambulatory Visit: Payer: Self-pay | Admitting: *Deleted

## 2023-03-04 ENCOUNTER — Telehealth: Payer: Self-pay

## 2023-03-04 ENCOUNTER — Other Ambulatory Visit: Payer: Self-pay | Admitting: Internal Medicine

## 2023-03-04 DIAGNOSIS — G4700 Insomnia, unspecified: Secondary | ICD-10-CM | POA: Diagnosis not present

## 2023-03-04 DIAGNOSIS — Z6823 Body mass index (BMI) 23.0-23.9, adult: Secondary | ICD-10-CM | POA: Diagnosis not present

## 2023-03-04 NOTE — Patient Outreach (Signed)
  Care Coordination   Follow Up Visit Note   03/04/2023 Name: [name] MRN: [id number] DOB: July 21, 1955  [name] is a 67 y.o. year old female who sees [place], Janyth Pupa, [place] for primary care. I spoke with  [name] by phone today.  What matters to the patients health and wellness today?  depression symptoms- 03/04/23 MD visit, She reports she will discuss this with her pcp She reports she has thought about receiving counseling services offered by her job  1612 Patient returned a call, left message at 1544 to confirm her pcp will refer her to a psychiatrist    Goals Addressed             This Visit's Progress    Managing worsening medical symptoms related to workplace stress- care coordination services   Not on track    Patient will continue to perform well at work and speak with leadership about her concerns Patient will manage medical diagnoses as ordered  Patient will seek services/resources for depression from care coordination or resource via her job  Interventions Today    Flowsheet Row Most Recent Value  Chronic Disease   Chronic disease during today's visit Other  [depression]  General Interventions   General Interventions Discussed/Reviewed General Interventions Reviewed, Community Resources  Doctor Visits Discussed/Reviewed Doctor Visits Reviewed, PCP  PCP/Specialist Visits Compliance with follow-up visit  Education Interventions   Education Provided Provided Education  Provided Verbal Education On Mental Health/Coping with Illness  [availability of care coordination social worker for short term counseling and counseling resources]  Mental Health Interventions   Mental Health Discussed/Reviewed Mental Health Reviewed, Coping Strategies, Depression              SDOH assessments and interventions completed:  Yes    03/04/2023    2:50 PM 05/22/2022   11:36 AM 02/06/2022   12:15 PM 08/15/2020    6:19 PM 07/17/2020    3:45 PM  Depression  screen PHQ 2/9  Decreased Interest 0 0 0 0 0  Down, Depressed, Hopeless 1 0 0 1 0  PHQ - 2 Score 1 0 0 1 0     Care Coordination Interventions:  Yes, provided   Follow up plan: Follow up call scheduled for 06/23/23    Encounter Outcome:  Patient Visit Completed   Dearmond L. Noelle Penner, RN, BSN, [facility]  VBCI Care Management Coordinator  [phone]  Fax: [phone]

## 2023-03-04 NOTE — Patient Instructions (Signed)
 Visit Information  Thank you for taking time to visit with me today. Please don't hesitate to contact me if I can be of assistance to you.   Following are the goals we discussed today:   Goals Addressed             This Visit's Progress    Managing worsening medical symptoms related to workplace stress- care coordination services   Not on track    Patient will continue to perform well at work and speak with leadership about her concerns Patient will manage medical diagnoses as ordered  Patient will seek services/resources for depression from care coordination or resource via her job  Interventions Today    Flowsheet Row Most Recent Value  Chronic Disease   Chronic disease during today's visit Other  [depression]  General Interventions   General Interventions Discussed/Reviewed General Interventions Reviewed, Community Resources  Doctor Visits Discussed/Reviewed Doctor Visits Reviewed, PCP  PCP/Specialist Visits Compliance with follow-up visit  Education Interventions   Education Provided Provided Education  Provided Verbal Education On Mental Health/Coping with Illness  [availability of care coordination social worker for short term counseling and counseling resources]  Mental Health Interventions   Mental Health Discussed/Reviewed Mental Health Reviewed, Coping Strategies, Depression              Our next appointment is by telephone on 06/23/23 at 1130  Please call the care guide team at [phone] if you need to cancel or reschedule your appointment.   If you are experiencing a Mental Health or Behavioral Health Crisis or need someone to talk to, please call the Suicide and Crisis Lifeline: 988 call the [place] National Suicide Prevention Lifeline: 832 712 6967 or TTY: (202) 222-3549 TTY 630-834-8097) to talk to a trained counselor call 1-800-273-TALK (toll free, 24 hour hotline) go to [facility] [address] [place]  [phone]) call 911   Patient verbalizes understanding of instructions and care plan provided today and agrees to view in MyChart. Active MyChart status and patient understanding of how to access instructions and care plan via MyChart confirmed with patient.     The patient has been provided with contact information for the care management team and has been advised to call with any health related questions or concerns.   Florella Mcneese L. Noelle Penner, RN, BSN, [facility]  VBCI Care Management Coordinator  [phone]  Fax: [phone]

## 2023-03-04 NOTE — Telephone Encounter (Signed)
Patient states that she is unable to take the Jardiance because of  urinary infections and would like to know what else she can take.

## 2023-03-05 NOTE — Telephone Encounter (Signed)
LMTCB

## 2023-03-06 NOTE — Telephone Encounter (Signed)
Patient aware and states that sugars are still up and down. I printed  [place] report for review

## 2023-03-07 ENCOUNTER — Telehealth: Payer: Self-pay

## 2023-03-07 DIAGNOSIS — I70213 Atherosclerosis of native arteries of extremities with intermittent claudication, bilateral legs: Secondary | ICD-10-CM

## 2023-03-07 NOTE — Telephone Encounter (Signed)
Patient advised to make sure she is using the pump correctly and it will adjust medication accordingly.

## 2023-03-07 NOTE — Telephone Encounter (Signed)
Patient called in leaving a message on the triage line stating she is having "some issues" and would like to be seen sooner than her January 20th appt. She stated this is the second time she has called and now her symptoms are getting worse so she is calling back and would like to be seen.  After reviewing patient's chart I returned her call, two identifiers used. Patient stated that she is having worsening symptoms since she has changed positions at work and they have her sitting at a certain desk and doing a certain job. She had called a few weeks ago and recommendations were made by my coworker, patient stated she had tried doing them and did take her trip and now she is still experiencing these cramping, tingling painful spells in her legs. Her legs and feet go numb and they bother her so much that now it is waking her up at night. She does get up and walk around during any episodes but she continues to have these problems. She has been wearing compression also to help with any swelling. At this point, she is requesting to move her appt up. Dr. Myra Gianotti does not have any availability at this time prior to her Jan. 20th appt, patient has been put on the PA schedule for Monday with an ABI [place]. I discussed this with Angel,RN who advised to offer this to the patient or a wait list for MD. Patient chose the visit on Monday 11/4. I told her if anything changes prior to then to call back. Patient in agreement with this plan

## 2023-03-10 ENCOUNTER — Ambulatory Visit (INDEPENDENT_AMBULATORY_CARE_PROVIDER_SITE_OTHER): Payer: 59 | Admitting: Physician Assistant

## 2023-03-10 ENCOUNTER — Encounter: Payer: Self-pay | Admitting: Surgery

## 2023-03-10 ENCOUNTER — Ambulatory Visit (HOSPITAL_COMMUNITY)
Admission: RE | Admit: 2023-03-10 | Discharge: 2023-03-10 | Disposition: A | Discharge status: Home / Self Care | Payer: 59 | Source: Ambulatory Visit | Attending: Surgery | Admitting: Surgery

## 2023-03-10 VITALS — BP 156/89 | HR 84 | Temp 97.8°F | Ht 62.0 in | Wt 131.8 lb

## 2023-03-10 DIAGNOSIS — I70213 Atherosclerosis of native arteries of extremities with intermittent claudication, bilateral legs: Secondary | ICD-10-CM

## 2023-03-10 LAB — VAS US ABI WITH/WO TBI
Left ABI: 1.13
Right ABI: 0.74

## 2023-03-10 NOTE — Progress Notes (Unsigned)
Office Note   History of Present Illness   [name] is a 67 y.o. (04-Dec-1955) female who presents as a triage visit.  She has a remote history of several percutaneous interventions by Dr. Allyson Sabal.  She also has a history of left femoral to above-knee popliteal artery bypass with PTFE on 06/09/2016 by Dr. Myra Gianotti.  This was done for lifestyle limiting claudication.  She also recently underwent left lower extremity angiogram with left femoropopliteal bypass thrombectomy and stenting on 10/01/2022.  This was done for an occluded bypass graft with rest pain.  She returns today as a triage visit.  She has been having issues with frequent cramping, throbbing, and burning sensations in both of her legs for the past 3 weeks.  This started after her work environment changed.  She states at work she has no room to stretch her legs and has to keep them "squished and propped up on boxes".  This has been causing her legs pain day and night.  Her pain does not specifically worsen with any position.  Heat compress somewhat helps.  She has stable right lower extremity claudication.  She denies any tissue loss.  Current Outpatient Medications  Medication Sig Dispense Refill   acetaminophen (TYLENOL) 325 MG tablet Take 2 tablets (650 mg total) by mouth every 6 (six) hours as needed for mild pain (or Fever >/= 101). 12 tablet 2   albuterol (PROAIR HFA) 108 (90 Base) MCG/ACT inhaler 1-2 inhalations every 4-6 hours as needed for cough or wheeze. (Patient taking differently: Inhale 1-2 puffs into the lungs every 4 (four) hours as needed for shortness of breath.) 1 Inhaler 1   ALPRAZolam (XANAX) 1 MG tablet Take 1 mg by mouth 2 (two) times daily as needed for anxiety.     aspirin EC 81 MG tablet Take 81 mg by mouth daily.     Azelastine HCl 0.15 % SOLN Place 2 sprays into both nostrils 2 (two) times daily. (Patient taking differently: Place 2 sprays into both nostrils daily as needed (allergies).) 30 mL 5    bisacodyl (DULCOLAX) 5 MG EC tablet Take 5 mg by mouth daily as needed for moderate constipation.     cephALEXin (KEFLEX) 500 MG capsule Take 1 capsule (500 mg total) by mouth 4 (four) times daily. (Patient not taking: Reported on 03/10/2023) 20 capsule 0   clopidogrel (PLAVIX) 75 MG tablet Take 1 tablet (75 mg total) by mouth daily. 30 tablet 11   Continuous Blood Gluc Receiver (DEXCOM G6 RECEIVER) DEVI by Does not apply route.     Continuous Glucose Sensor (DEXCOM G7 SENSOR) MISC 1 Device by Does not apply route as directed. 9 each 3   doxycycline (VIBRAMYCIN) 100 MG capsule Take 1 capsule (100 mg total) by mouth 2 (two) times daily. (Patient taking differently: Take 100 mg by mouth as needed (Infection).) 14 capsule 0   EPINEPHrine 0.3 mg/0.3 mL IJ SOAJ injection Inject 0.3 mLs (0.3 mg total) into the muscle once. 1 Device 1   hydrocortisone (ANUSOL-HC) 2.5 % rectal cream Place rectally daily as needed for hemorrhoids.     Insulin Disposable Pump (OMNIPOD 5 G7 INTRO, GEN 5,) KIT 1 Device by Does not apply route every other day. 1 kit 0   Insulin Disposable Pump (OMNIPOD 5 G7 PODS, GEN 5,) MISC 1 Device by Does not apply route every other day. 45 each 3   insulin lispro (HUMALOG) 100 UNIT/ML injection Max daily 60 units 60 mL 3  losartan-hydrochlorothiazide (HYZAAR) 50-12.5 MG tablet Take 1 tablet by mouth daily. 90 tablet 3   lubiprostone (AMITIZA) 8 MCG capsule Take 1 capsule (8 mcg total) by mouth 2 (two) times daily with a meal. 60 capsule 5   Magnesium 200 MG TABS Take 1 tablet (200 mg total) by mouth daily. (Patient taking differently: Take 200 mg by mouth 2 (two) times daily.) 30 tablet 6   montelukast (SINGULAIR) 10 MG tablet Take 10 mg by mouth at bedtime as needed (allergies).     mupirocin ointment (BACTROBAN) 2 % Apply 1 application topically 2 (two) times daily. (Patient taking differently: Apply 1 application  topically 2 (two) times daily as needed.) 30 g 1   nitroGLYCERIN  (NITROSTAT) 0.4 MG SL tablet PLACE 1 TAB UNDER TONGUE EVERY 5 MINS AS NEEDED FOR CHEST PAIN - MAX 3 DOSES THEN 911 25 tablet 2   nortriptyline (PAMELOR) 10 MG capsule Take 1 capsule (10 mg total) by mouth at bedtime. (Patient taking differently: Take 10 mg by mouth at bedtime as needed for sleep.) 30 capsule 5   pantoprazole (PROTONIX) 40 MG tablet TAKE 1 TABLET BY MOUTH EVERY DAY (Patient taking differently: Take 40 mg by mouth daily.) 30 tablet 11   polyethylene glycol (MIRALAX / GLYCOLAX) 17 g packet Take 17 g by mouth daily as needed for moderate constipation or severe constipation.     potassium chloride (KLOR-CON) 10 MEQ tablet Take 10 mEq by mouth 2 (two) times daily.     rosuvastatin (CRESTOR) 40 MG tablet Take 1 tablet (40 mg total) by mouth daily. 90 tablet 3   SUMAtriptan (IMITREX) 100 MG tablet Take 100 mg by mouth every 2 (two) hours as needed for migraine.      No current facility-administered medications for this visit.    REVIEW OF SYSTEMS (negative unless checked):   Cardiac:  []  Chest pain or chest pressure? []  Shortness of breath upon activity? []  Shortness of breath when lying flat? []  Irregular heart rhythm?  Vascular:  [x]  Pain in calf, thigh, or hip brought on by walking? [x]  Pain in feet at night that wakes you up from your sleep? []  Blood clot in your veins? []  Leg swelling?  Pulmonary:  []  Oxygen at home? []  Productive cough? []  Wheezing?  Neurologic:  []  Sudden weakness in arms or legs? []  Sudden numbness in arms or legs? []  Sudden onset of difficult speaking or slurred speech? []  Temporary loss of vision in one eye? []  Problems with dizziness?  Gastrointestinal:  []  Blood in stool? []  Vomited blood?  Genitourinary:  []  Burning when urinating? []  Blood in urine?  Psychiatric:  []  Major depression  Hematologic:  []  Bleeding problems? []  Problems with blood clotting?  Dermatologic:  []  Rashes or ulcers?  Constitutional:  []  Fever or  chills?  Ear/Nose/Throat:  []  Change in hearing? []  Nose bleeds? []  Sore throat?  Musculoskeletal:  []  Back pain? []  Joint pain? []  Muscle pain?   Physical Examination   Vitals:   03/10/23 1412  BP: (!) 156/89  Pulse: 84  Temp: 97.8 F (36.6 C)  SpO2: 97%  Weight: 131 lb 12.8 oz (59.8 kg)  Height: 5\' 2"  (1.575 m)   Body mass index is 24.11 kg/m.  General:  WDWN in NAD; vital signs documented above Gait: Not observed HENT: WNL, normocephalic Pulmonary: normal non-labored breathing , without rales, rhonchi,  wheezing Cardiac: regular Abdomen: soft, NT, no masses Skin: without rashes Vascular Exam/Pulses: Palpable left DP pulse.  Brisk  right DP/PT Doppler signals Extremities: without ischemic changes, without gangrene , without cellulitis; without open wounds;  Musculoskeletal: no muscle wasting or atrophy  Neurologic: A&O X 3;  No focal weakness or paresthesias are detected Psychiatric:  The pt has Normal affect.  Non-Invasive Vascular imaging   ABI (03/10/2023) R:  ABI: 0.74 (0.59),  PT: mono DP: mono TBI:  0.38 L:  ABI: 1.13 (0.99),  PT: tri DP: tri TBI: 0.62   Medical Decision Making   [name] is a 67 y.o. female who presents as a triage visit  Based on the patient's vascular studies, her ABIs in the left are essentially unchanged at 1.13.  Her ABIs on the right are slightly increased from 0.59 to 0.74 She recently underwent thrombectomy and stenting of her left femoropopliteal bypass graft for occlusion with rest pain.  After her procedure, her rest pain was resolved.  At her last follow-up with our office she had stable right calf claudication She has been experiencing equal, bilateral lower extremity cramping/throbbing/burning sensations for the past 3 weeks.  This started happening after changes at work.  She states she was put in a new position at work and has no room to stretch her legs.  Most of the time she has to keep both of her feet  propped up on filing boxes.  This is caused fairly frequent pain in both of her legs, which is equal at movement and at rest On exam the patient has a palpable left DP pulse.  She has brisk right DP/PT Doppler signals.  I have reassured her that her pain does not sound arterial nature.  Potentially her positional changes at work has caused nerve irritation due to poor posture and/or spine issues I have given her a work note asking for her to have more leg room at work and the ability to get up and walk around as needed.  She is also considering changing jobs soon to get rid of this pain.  She is not interested in pain medication at this time due to her history of drug abuse She can keep her follow-up with Dr. Myra Gianotti in January   Loel Dubonnet PA-C [facility] Office: [phone]  Clinic MD: Myra Gianotti

## 2023-03-12 DIAGNOSIS — E1165 Type 2 diabetes mellitus with hyperglycemia: Secondary | ICD-10-CM | POA: Diagnosis not present

## 2023-03-12 DIAGNOSIS — Z794 Long term (current) use of insulin: Secondary | ICD-10-CM | POA: Diagnosis not present

## 2023-03-12 DIAGNOSIS — E1149 Type 2 diabetes mellitus with other diabetic neurological complication: Secondary | ICD-10-CM | POA: Diagnosis not present

## 2023-03-13 ENCOUNTER — Other Ambulatory Visit: Payer: Self-pay

## 2023-03-13 MED ORDER — INSULIN LISPRO 100 UNIT/ML IJ SOLN: INTRAMUSCULAR | 3 refills | Status: DC

## 2023-03-17 ENCOUNTER — Ambulatory Visit: Payer: 59 | Admitting: Cardiovascular Disease

## 2023-03-17 DIAGNOSIS — G4700 Insomnia, unspecified: Secondary | ICD-10-CM | POA: Diagnosis not present

## 2023-03-31 DIAGNOSIS — R945 Abnormal results of liver function studies: Secondary | ICD-10-CM | POA: Diagnosis not present

## 2023-03-31 DIAGNOSIS — R7989 Other specified abnormal findings of blood chemistry: Secondary | ICD-10-CM | POA: Diagnosis not present

## 2023-03-31 DIAGNOSIS — K219 Gastro-esophageal reflux disease without esophagitis: Secondary | ICD-10-CM | POA: Diagnosis not present

## 2023-03-31 DIAGNOSIS — T148XXA Other injury of unspecified body region, initial encounter: Secondary | ICD-10-CM | POA: Diagnosis not present

## 2023-04-11 DIAGNOSIS — E1165 Type 2 diabetes mellitus with hyperglycemia: Secondary | ICD-10-CM | POA: Diagnosis not present

## 2023-04-11 DIAGNOSIS — Z794 Long term (current) use of insulin: Secondary | ICD-10-CM | POA: Diagnosis not present

## 2023-04-11 DIAGNOSIS — E1149 Type 2 diabetes mellitus with other diabetic neurological complication: Secondary | ICD-10-CM | POA: Diagnosis not present

## 2023-04-16 DIAGNOSIS — M25552 Pain in left hip: Secondary | ICD-10-CM | POA: Diagnosis not present

## 2023-04-19 DIAGNOSIS — M25552 Pain in left hip: Secondary | ICD-10-CM | POA: Diagnosis not present

## 2023-04-19 DIAGNOSIS — M25551 Pain in right hip: Secondary | ICD-10-CM | POA: Diagnosis not present

## 2023-04-21 DIAGNOSIS — R1013 Epigastric pain: Secondary | ICD-10-CM | POA: Diagnosis not present

## 2023-04-21 DIAGNOSIS — E119 Type 2 diabetes mellitus without complications: Secondary | ICD-10-CM | POA: Diagnosis not present

## 2023-04-21 DIAGNOSIS — K2100 Gastro-esophageal reflux disease with esophagitis, without bleeding: Secondary | ICD-10-CM | POA: Diagnosis not present

## 2023-04-21 DIAGNOSIS — G4700 Insomnia, unspecified: Secondary | ICD-10-CM | POA: Diagnosis not present

## 2023-04-21 DIAGNOSIS — I10 Essential (primary) hypertension: Secondary | ICD-10-CM | POA: Diagnosis not present

## 2023-04-22 LAB — LAB REPORT - SCANNED: EGFR: 72

## 2023-04-29 DIAGNOSIS — M62838 Other muscle spasm: Secondary | ICD-10-CM | POA: Diagnosis not present

## 2023-05-05 DIAGNOSIS — M25552 Pain in left hip: Secondary | ICD-10-CM | POA: Diagnosis not present

## 2023-05-05 DIAGNOSIS — M25551 Pain in right hip: Secondary | ICD-10-CM | POA: Diagnosis not present

## 2023-05-08 NOTE — Progress Notes (Addendum)
 05/08/2023 [name] [id number] 09-30-1955   Chief Complaint: Acid reflux   History of Present Illness: [name] is a 68 year old female with a past medical history of anxiety, asthma, hypertension, coronary artery disease s/p MI 2003, peripheral arterial disease s/p left fem-pop bypass with graft 06/2016 with occlusion s/p thrombectomy 10/01/2022, CVA x 3, DM type II, gastroparesis, ovarian cancer, diverticulosis, IBS, presumed diverticular bleed 08/2011, GERD, PUD and UGI bleed/melena 06/2019. Past cholecystectomy. She is known by Dr. Avram. She endorses having significant acid reflux symptoms since early December 2024 despite taking Pantoprazole  40 mg twice daily and Famotidine  40 mg twice daily.  She is experiencing daily RUQ, epigastric and LUQ pain sometimes radiates up through her esophagus.  She noted passing 2 solid black stools last week without further recurrence.  Not taking Pepto-Bismol or oral iron.  No bright red blood per the rectum.  She remains on Plavix  and ASA with history of CAD, CVA and PAD.  She takes Amitiza  which controls her constipation.  Her most recent colonoscopy 03/02/2020 identified 3 small tubular adenomatous polyps removed from the colon. She also noted passing vaginal discharge described as brown which she stated smelled like blood which she has addressed with her PCP and they elected to monitor for now.  She underwent a complete hysterectomy in the past.  Addendum: She has lost 6 to 7 lbs (not 67lbs) over the past 4 weeks.  She has noticed she bruises easily, bruises to her neck, chest and abdomen.  She also feels weak like she might pass out but does not.  No chest pain, palpitations, dizziness or shortness of breath.  Labs from PCP 04/21/2023: WBC 7.0.  Hemoglobin 12.2.  Hematocrit 36.9.  MCV 86.4.  Platelet 317.  BUN 15.  Creatinine 0.87.  Total bili 1.5.  Alk phos 63.  AST 13.  ALT 10.  Lipase 19.     Latest Ref Rng & Units 02/17/2023    3:05 PM  02/17/2023    2:53 PM 10/01/2022   11:50 AM  CBC  WBC 4.0 - 10.5 K/uL  4.1    Hemoglobin 12.0 - 15.0 g/dL 87.7  88.2  85.6   Hematocrit 36.0 - 46.0 % 36.0  37.9  42.0   Platelets 150 - 400 K/uL  220         Latest Ref Rng & Units 02/17/2023    3:05 PM 02/17/2023    2:53 PM 10/01/2022   11:50 AM  CMP  Glucose 70 - 99 mg/dL 896  894  831   BUN 8 - 23 mg/dL 9  8  20    Creatinine 0.44 - 1.00 mg/dL 9.29  9.31  9.09   Sodium 135 - 145 mmol/L 143  140  142   Potassium 3.5 - 5.1 mmol/L 3.9  3.8  4.5   Chloride 98 - 111 mmol/L 108  109  104   CO2 22 - 32 mmol/L  25    Calcium  8.9 - 10.3 mg/dL  9.2    Total Protein 6.5 - 8.1 g/dL  6.2    Total Bilirubin 0.3 - 1.2 mg/dL  1.2    Alkaline Phos 38 - 126 U/L  53    AST 15 - 41 U/L  22    ALT 0 - 44 U/L  20      PAST GI PROCEDURES:   EGD 07/01/2019 as an inpatient by Dr. Golda: - Normal esophagus. - Z-line irregular, 35 cm  from the incisors. - 2 cm hiatal hernia with focal erythema to mucosa below GEJ. - A Nissen fundoplication was found. The wrap appears loose. - Normal duodenal bulb, second portion of the duodenum and area of the papilla. - No specimens collected.   Colonoscopy 07/02/2019: - One 5 mm polyp at the hepatic flexure, removed with a cold snare. Resected and retrieved. - Diverticulosis in the sigmoid colon, in the descending colon and in the transverse colon.   Colonoscopy 03/02/2020: - Three 2 to 4 mm polyps in the proximal transverse colon and at the hepatic flexure, removed with a cold snare. Resected and retrieved. - Mild pancolonic diverticulosis. - Non-bleeding internal hemorrhoids. - The examined portion of the ileum was normal. - The examination was otherwise normal on direct and retroflexion views. No masses. - 5 year colonoscopy recall - TUBULAR ADENOMA(S) - NEGATIVE FOR HIGH-GRADE DYSPLASIA OR MALIGNANCY    Past Medical History:  Diagnosis Date   Allergic urticaria 07/10/2015   Allergy     Anemia    hx    Angioedema 07/10/2015   Anxiety    Arthritis    neck, left hand (09/14/2012)   Asthma    Cancer (HCC) 1985   ovarian, no treatment except surgery   Cataract    Complication of anesthesia    OCCASIONAL TROUBLE TURNING NECK TO RIGHT   Critical lower limb ischemia (HCC)    10/2014 s/p L SFA stenting   Diverticulosis of colon with hemorrhage April 2013   GERD (gastroesophageal reflux disease)    GI bleed    H/O hiatal hernia    Heart attack (HCC)    2003 mild MI, March 2013 mild MI   Hidradenitis    groin   History of blood transfusion 1985 AND 2013   Hyperlipidemia    Hypertension    Irritable bowel syndrome    Left-sided weakness    since stroke, left eye trouble seeing   Migraines    Mild CAD    a. Cath 09/2010: mild luminal irregularities of LAD, 30% prox RCA and 20-30% mRCA, EF 65%.   Neuropathy    Obesity    Osteoporosis    PAD (peripheral artery disease) (HCC)    a. critical limb ischemia s/p PTA/stenting of L SFA 10/2014. c. occ prior SFA stent by angio 01/2016, for possible PV bypass.   Pneumonia    baby   Recurrent upper respiratory infection (URI)    S/P arterial stent-mid Lt SFA 11/03/14 11/04/2014   Schatzki's ring    Sinus problem    Stomach ulcer 1972   non-bleeding   Stroke (HCC) 07-2007, 07-2008, 07-2009   total 3 strokes; mild left sided weakness and left eye jumps.   Type II diabetes mellitus (HCC)    Vitamin B 12 deficiency 07-15-2013   Past Surgical History:  Procedure Laterality Date   ABDOMINAL AORTOGRAM W/LOWER EXTREMITY Left 10/01/2022   Procedure: ABDOMINAL AORTOGRAM W/LOWER EXTREMITY;  Surgeon: Serene Gaile ORN, MD;  Location: [facility];  Service: Cardiovascular;  Laterality: Left;   ABDOMINAL HYSTERECTOMY  1985   ADENOIDECTOMY     ANTERIOR CERVICAL DECOMP/DISCECTOMY FUSION  2002   ANTERIOR CERVICAL DECOMP/DISCECTOMY FUSION N/A 04/08/2014   Procedure: Cervical Six-Seven ANTERIOR CERVICAL DECOMPRESSION/DISCECTOMY FUSION Plating and  Bonegraft  2 LEVELS;  Surgeon: Rockey Peru, MD;  Location: [facility];  Service: Neurosurgery;  Laterality: N/A;  Cervical Six-Seven ANTERIOR CERVICAL DECOMPRESSION/DISCECTOMY FUSION Plating and Bonegraft  2 LEVELS   APPENDECTOMY  1985  AXILLARY HIDRADENITIS EXCISION  1990-2008   bilateral   BACK SURGERY     BREAST BIOPSY Right 2007   BREAST CYST EXCISION Right 2008   BREAST REDUCTION SURGERY     CARDIAC CATHETERIZATION  2004   mild disease   CATARACT EXTRACTION W/PHACO Left 05/16/2015   Procedure: CATARACT EXTRACTION PHACO AND INTRAOCULAR LENS PLACEMENT (IOC);  Surgeon: Oneil Platts, MD;  Location: [facility];  Service: Ophthalmology;  Laterality: Left;  CDE: 4.24   CATARACT EXTRACTION W/PHACO Right 05/30/2015   Procedure: CATARACT EXTRACTION RIGHT EYE PHACO AND INTRAOCULAR LENS PLACEMENT ;  Surgeon: Oneil Platts, MD;  Location: [facility];  Service: Ophthalmology;  Laterality: Right;  CDE:4.08   CHOLECYSTECTOMY  1990's   COLONOSCOPY  08/12/2011   Procedure: COLONOSCOPY;  Surgeon: Gwendlyn ONEIDA Buddy, MD,FACG;  Location: [facility] ENDOSCOPY;  Service: Endoscopy;  Laterality: N/A;   COLONOSCOPY  06/19/2006   COLONOSCOPY WITH PROPOFOL  N/A 07/02/2019   Procedure: COLONOSCOPY WITH PROPOFOL ;  Surgeon: Golda Claudis PENNER, MD;  Location: [facility];  Service: Endoscopy;  Laterality: N/A;   cyst thigh Right    ESOPHAGOGASTRODUODENOSCOPY  08/12/2011   Procedure: ESOPHAGOGASTRODUODENOSCOPY (EGD);  Surgeon: Gwendlyn ONEIDA Buddy, MD,FACG;  Location: [facility] ENDOSCOPY;  Service: Endoscopy;  Laterality: N/A;   ESOPHAGOGASTRODUODENOSCOPY  06/04/2005   ESOPHAGOGASTRODUODENOSCOPY (EGD) WITH PROPOFOL  N/A 07/01/2019   Procedure: ESOPHAGOGASTRODUODENOSCOPY (EGD) WITH PROPOFOL ;  Surgeon: Golda Claudis PENNER, MD;  [facility]: [facility];  Service: Endoscopy;  Laterality: N/A;   FEMORAL-POPLITEAL BYPASS GRAFT Left 06/19/2016   Procedure: Left Leg BYPASS GRAFT FEMORAL-POPLITEAL ARTERY;  Surgeon: Gaile LELON New, MD;  Location: [facility];  Service:  Vascular;  Laterality: Left;   GIVENS CAPSULE STUDY  08/13/2011   Procedure: GIVENS CAPSULE STUDY;  Surgeon: Gwendlyn ONEIDA Buddy, MD,FACG;  Location: [facility] ENDOSCOPY;  Service: Endoscopy;  Laterality: N/A;   HAMMER TOE SURGERY Bilateral ~ 2000   HEMOSTASIS CLIP PLACEMENT  07/02/2019   Procedure: HEMOSTASIS CLIP PLACEMENT;  Surgeon: Golda Claudis PENNER, MD;  [facility]: [facility];  Service: Endoscopy;;  hepatic flexure   HIATAL HERNIA REPAIR     HYDRADENITIS EXCISION  01/2011; 03/2012   'groin and abdomen; 03/2012 (09/14/2012)   HYDRADENITIS EXCISION  04/01/2012   Procedure: EXCISION HYDRADENITIS GROIN;  Surgeon: Donnice KATHEE Lunger, MD;  Location: [facility] ORS;  Service: General;  Laterality: Bilateral;  Excision of Hydradenitis of Perineum   HYDRADENITIS EXCISION N/A 09/17/2013   Procedure: EXCISION PERINEAL HIDRADENITIS ;  Surgeon: Donnice KATHEE Lunger, MD;  Location: [facility] OR[facility];  Service: General;  Laterality: N/A;  also in the pubis area   LEFT HEART CATH AND CORONARY ANGIOGRAPHY N/A 12/26/2016   Procedure: LEFT HEART CATH AND CORONARY ANGIOGRAPHY;  Surgeon: Jordan, Peter M, MD;  Location: [facility] INVASIVE CV LAB;  Service: Cardiovascular;  Laterality: N/A;   MASS EXCISION Right 09/17/2013   Procedure: EXCISION MASS;  Surgeon: Donnice KATHEE Lunger, MD;  Location: [facility] OR[facility];  Service: General;  Laterality: Right;   NISSEN FUNDOPLICATION  1996   PERIPHERAL VASCULAR CATHETERIZATION N/A 11/03/2014   Procedure: Lower Extremity Angiography;  Surgeon: Dorn JINNY Lesches, MD;  Location: [facility];  Service: Cardiovascular;  Laterality: N/A;   PERIPHERAL VASCULAR CATHETERIZATION N/A 01/29/2016   Procedure: Lower Extremity Angiography;  Surgeon: Dorn JINNY Lesches, MD;  Location: [facility] INVASIVE CV LAB;  Service: Cardiovascular;  Laterality: N/A;   PERIPHERAL VASCULAR INTERVENTION Left 10/01/2022   Procedure: PERIPHERAL VASCULAR INTERVENTION;  Surgeon: New Gaile LELON, MD;  Location: [facility];  Service:  Cardiovascular;  Laterality:  Left;  fem-pop bypass   PERIPHERAL VASCULAR THROMBECTOMY Left 10/01/2022   Procedure: PERIPHERAL VASCULAR THROMBECTOMY;  Surgeon: Serene Gaile ORN, MD;  Location: [facility];  Service: Cardiovascular;  Laterality: Left;  fem-pop bypass   POLYPECTOMY  07/02/2019   Procedure: POLYPECTOMY;  Surgeon: Golda Claudis PENNER, MD;  Location: [facility];  Service: Endoscopy;;   POSTERIOR LUMBAR FUSION  2008 X 2   REDUCTION MAMMAPLASTY  1996?   TONSILLECTOMY AND ADENOIDECTOMY  1959 AND 2000   UVULOPALATOPHARYNGOPLASTY, TONSILLECTOMY AND SEPTOPLASTY  2000's   Current Outpatient Medications on File Prior to Visit  Medication Sig Dispense Refill   acetaminophen  (TYLENOL ) 325 MG tablet Take 2 tablets (650 mg total) by mouth every 6 (six) hours as needed for mild pain (or Fever >/= 101). 12 tablet 2   albuterol  (PROAIR  HFA) 108 (90 Base) MCG/ACT inhaler 1-2 inhalations every 4-6 hours as needed for cough or wheeze. (Patient taking differently: Inhale 1-2 puffs into the lungs every 4 (four) hours as needed for shortness of breath.) 1 Inhaler 1   ALPRAZolam  (XANAX ) 1 MG tablet Take 1 mg by mouth 2 (two) times daily as needed for anxiety.     aspirin  EC 81 MG tablet Take 81 mg by mouth daily.     Azelastine  HCl 0.15 % SOLN Place 2 sprays into both nostrils 2 (two) times daily. (Patient taking differently: Place 2 sprays into both nostrils daily as needed (allergies).) 30 mL 5   bisacodyl  (DULCOLAX) 5 MG EC tablet Take 5 mg by mouth daily as needed for moderate constipation.     cephALEXin  (KEFLEX ) 500 MG capsule Take 1 capsule (500 mg total) by mouth 4 (four) times daily. 20 capsule 0   clopidogrel  (PLAVIX ) 75 MG tablet Take 1 tablet (75 mg total) by mouth daily. 30 tablet 11   Continuous Blood Gluc Receiver (DEXCOM G6 RECEIVER) DEVI by Does not apply route.     Continuous Glucose Sensor (DEXCOM G7 SENSOR) MISC 1 Device by Does not apply route as directed. 9 each 3   doxycycline  (VIBRAMYCIN ) 100 MG capsule  Take 1 capsule (100 mg total) by mouth 2 (two) times daily. (Patient taking differently: Take 100 mg by mouth as needed (Infection).) 14 capsule 0   EPINEPHrine  0.3 mg/0.3 mL IJ SOAJ injection Inject 0.3 mLs (0.3 mg total) into the muscle once. 1 Device 1   famotidine  (PEPCID ) 40 MG tablet Take 40 mg by mouth daily.     hydrocortisone  (ANUSOL -HC) 2.5 % rectal cream Place rectally daily as needed for hemorrhoids.     Insulin  Disposable Pump (OMNIPOD 5 G7 INTRO, GEN 5,) KIT 1 Device by Does not apply route every other day. 1 kit 0   Insulin  Disposable Pump (OMNIPOD 5 G7 PODS, GEN 5,) MISC 1 Device by Does not apply route every other day. 45 each 3   insulin  lispro (HUMALOG ) 100 UNIT/ML injection Max daily 60 units 60 mL 3   losartan -hydrochlorothiazide  (HYZAAR) 50-12.5 MG tablet Take 1 tablet by mouth daily. 90 tablet 3   lubiprostone  (AMITIZA ) 8 MCG capsule Take 1 capsule (8 mcg total) by mouth 2 (two) times daily with a meal. 60 capsule 5   Magnesium  200 MG TABS Take 1 tablet (200 mg total) by mouth daily. (Patient taking differently: Take 200 mg by mouth 2 (two) times daily.) 30 tablet 6   montelukast  (SINGULAIR ) 10 MG tablet Take 10 mg by mouth at bedtime as needed (allergies).  mupirocin  ointment (BACTROBAN ) 2 % Apply 1 application topically 2 (two) times daily. (Patient taking differently: Apply 1 application  topically 2 (two) times daily as needed.) 30 g 1   nitroGLYCERIN  (NITROSTAT ) 0.4 MG SL tablet PLACE 1 TAB UNDER TONGUE EVERY 5 MINS AS NEEDED FOR CHEST PAIN - MAX 3 DOSES THEN 911 25 tablet 2   nortriptyline  (PAMELOR ) 10 MG capsule Take 1 capsule (10 mg total) by mouth at bedtime. (Patient taking differently: Take 10 mg by mouth at bedtime as needed for sleep.) 30 capsule 5   pantoprazole  (PROTONIX ) 40 MG tablet TAKE 1 TABLET BY MOUTH EVERY DAY (Patient taking differently: Take 40 mg by mouth daily.) 30 tablet 11   polyethylene glycol (MIRALAX  / GLYCOLAX ) 17 g packet Take 17 g by mouth  daily as needed for moderate constipation or severe constipation.     potassium chloride  (KLOR-CON ) 10 MEQ tablet Take 10 mEq by mouth 2 (two) times daily.     rosuvastatin  (CRESTOR ) 40 MG tablet Take 1 tablet (40 mg total) by mouth daily. 90 tablet 3   SUMAtriptan  (IMITREX ) 100 MG tablet Take 100 mg by mouth every 2 (two) hours as needed for migraine.      zolpidem  (AMBIEN ) 5 MG tablet Take 5 mg by mouth at bedtime as needed.     No current facility-administered medications on file prior to visit.   Allergies  Allergen Reactions   Sulfa  Antibiotics Shortness Of Breath and Palpitations   Codeine Other (See Comments)    Recovering Addict does not like to take Narcotics   Fish Allergy  Hives and Swelling    Tongue swelling   Iodine  Swelling   Metformin  And Related Diarrhea   Soliqua [Insulin  Glargine-Lixisenatide] Itching and Other (See Comments)    tongue swelling   Shellfish Allergy  Swelling and Rash    Tongue swelling   Tape Rash    Paper tape is ok   Current Medications, Allergies, Past Medical History, Past Surgical History, Family History and Social History were reviewed in [employer] record.  Review of Systems:   Constitutional: Negative for fever, sweats, chills or weight loss.  Respiratory: Negative for shortness of breath.   Cardiovascular: Negative for chest pain, palpitations and leg swelling.  Gastrointestinal: See HPI.  Musculoskeletal: Negative for back pain or muscle aches.  Neurological: Negative for dizziness, headaches or paresthesias.   Physical Exam: BP 132/78   Pulse 67   Ht 5' 4 (1.626 m)   Wt 129 lb (58.5 kg)   BMI 22.14 kg/m   General: 68 year old female in no acute distress. Head: Normocephalic and atraumatic. Eyes: No scleral icterus. Conjunctiva pink . Ears: Normal auditory acuity. Mouth: Upper dentures. No ulcers or lesions.  Lungs: Clear throughout to auscultation. Heart: Regular rate and rhythm, no  murmur. Abdomen: Soft, nondistended. Moderate tenderness to the RUQ, epigastric and LUQ area. No masses or hepatomegaly. Normal bowel sounds x 4 quadrants.  Rectal: Deferred.  Musculoskeletal: Symmetrical with no gross deformities. Extremities: No edema. Neurological: Alert oriented x 4. No focal deficits.  Psychological: Alert and cooperative. Normal mood and affect  Assessment and Recommendations:  68 year old female with a history of GERD/PUD and past Nissen Fundoplication surgery presents with significant acid reflux with upper abdominal pain x 3 to 4 weeks.  On Pantoprazole  40 mg twice daily and Famotidine  40 mg twice daily.  Patient endorsed passing 2 solid black stools last week.  No Pepto-Bismol or oral iron use.  On Plavix  and ASA.  Hemoglobin 12.2 on 04/21/2023.  EGD 06/2019 showed a 2 cm hiatal hernia, and a prior [employer] Fundoplication, wrap appeared loose. -EGD benefits and risks discussed including risk with sedation, risk of bleeding, perforation and infection. EGD to be scheduled after cardiac clearance received  -Continue pantoprazole  40 mg twice daily and famotidine  40 mg twice daily for now -Gaviscon 1 tablespoon 3 times daily as needed -CBC -Patient instructed go to the ED if she passes frequent loose black stools  History of colon polyps. Colonoscopy 03/02/2020 identified 3 small tubular adenomatous polyps removed from the colon. -Next colonoscopy due 02/2025  Coronary artery disease s/p MI 2003, last seen by her cardiologist more than 1 year ago. -Request cardiac clearance to also include Plavix  hold instructions from Dr. Dorn Lesches  Peripheral arterial disease s/p left fem-pop bypass with graft 06/2016 with occlusion s/p thrombectomy 10/01/2022.  On Plavix  and ASA. -We may also need to contact vascular surgeon Dr. Gaile New regarding Plavix  hold instructions prior to proceeding with an EGD  History of CVA x 3  Dark vaginal discharge in setting of past total  hysterectomy -Recommend GYN consult, defer to PCP

## 2023-05-09 ENCOUNTER — Ambulatory Visit: Payer: 59 | Admitting: Nurse Practitioner

## 2023-05-09 ENCOUNTER — Other Ambulatory Visit (INDEPENDENT_AMBULATORY_CARE_PROVIDER_SITE_OTHER): Payer: 59

## 2023-05-09 ENCOUNTER — Telehealth: Payer: Self-pay

## 2023-05-09 ENCOUNTER — Encounter: Payer: Self-pay | Admitting: Nurse Practitioner

## 2023-05-09 VITALS — BP 132/78 | HR 67 | Ht 64.0 in | Wt 129.0 lb

## 2023-05-09 DIAGNOSIS — R1011 Right upper quadrant pain: Secondary | ICD-10-CM | POA: Diagnosis not present

## 2023-05-09 DIAGNOSIS — R1012 Left upper quadrant pain: Secondary | ICD-10-CM

## 2023-05-09 DIAGNOSIS — K219 Gastro-esophageal reflux disease without esophagitis: Secondary | ICD-10-CM

## 2023-05-09 DIAGNOSIS — R1013 Epigastric pain: Secondary | ICD-10-CM

## 2023-05-09 LAB — BASIC METABOLIC PANEL
BUN: 13 mg/dL (ref 6–23)
CO2: 26 meq/L (ref 19–32)
Calcium: 10 mg/dL (ref 8.4–10.5)
Chloride: 104 meq/L (ref 96–112)
Creatinine, Ser: 0.86 mg/dL (ref 0.40–1.20)
GFR: 69.47 mL/min (ref >60.00)
Glucose, Bld: 152 mg/dL — ABNORMAL HIGH (ref 70–99)
Potassium: 3.5 meq/L (ref 3.5–5.1)
Sodium: 141 meq/L (ref 135–145)

## 2023-05-09 LAB — CBC
HCT: 38.1 % (ref 36.0–46.0)
Hemoglobin: 12.5 g/dL (ref 12.0–15.0)
MCHC: 32.9 g/dL (ref 30.0–36.0)
MCV: 88.5 fL (ref 78.0–100.0)
Platelets: 342 10*3/uL (ref 150.0–400.0)
RBC: 4.3 Mil/uL (ref 3.87–5.11)
RDW: 14.6 % (ref 11.5–15.5)
WBC: 7.8 10*3/uL (ref 4.0–10.5)

## 2023-05-09 NOTE — Telephone Encounter (Signed)
 [facility] [address] [place], [place]  [address] Phone:  [phone]   Fax:  [phone]   [name] DOB: 05/23/1955 MRN: [id number]  Dear: [name] :   The patient above is schedule for a endoscopy in the near future under general anesthesia (Propofol ).    Please fax or route a note of Medical Clearance & also include if it is ok to hold Plavix  5 days prior to [phone], Attn: Kaizer Dissinger, CMA  Please advise if this patient will require an office visit or further medical work-up before clearance can be given.   Thank you,    [facility]

## 2023-05-09 NOTE — Telephone Encounter (Signed)
 Primary Cardiologist:Jennifer Court, MD  Chart reviewed as part of pre-operative protocol coverage. Because of [name]'s past medical history and time since last visit, he/she will require a follow-up visit in order to better assess preoperative cardiovascular risk.  Pre-op covering staff: - Please schedule appointment and call patient to inform them (last seen 03/2022).  - Please contact requesting surgeon's office via preferred method (i.e, phone, fax) to inform them of need for appointment prior to surgery.  Plavix  is prescribed by vascular surgery, therefore request to hold will need to be addressed by them.  Jennifer EMERSON Bane, NP-C  05/09/2023, 11:59 AM [address] Office [phone] Fax [phone]

## 2023-05-09 NOTE — Telephone Encounter (Signed)
 Preop in office visit has now been scheduled. Pt agrees to appt date and time

## 2023-05-09 NOTE — Patient Instructions (Addendum)
 Your provider has requested that you go to the basement level for lab work before leaving today. Press B on the elevator. The lab is located at the first door on the left as you exit the elevator.  Cardiac clearance is required prior to scheduling your upper endoscopy.  Gaviscon- take 1 tablespoon by mouth 3 times daily as needed for heartburn (Over the counter)  Go to the emergency room if you pass frequent loose black stools.  Due to recent changes in healthcare laws, you may see the results of your imaging and laboratory studies on MyChart before your provider has had a chance to review them.  We understand that in some cases there may be results that are confusing or concerning to you. Not all laboratory results come back in the same time frame and the provider may be waiting for multiple results in order to interpret others.  Please give us  48 hours in order for your provider to thoroughly review all the results before contacting the office for clarification of your results.   Thank you for trusting me with your gastrointestinal care!   Elida Shawl, CRNP

## 2023-05-09 NOTE — Telephone Encounter (Signed)
1st attempt to reach pt regarding surgical clearance and the need for an IN OFFICE appointment.  Left pt a detailed message to call back and get that scheduled.

## 2023-05-09 NOTE — Telephone Encounter (Signed)
 S/w the pt about needing in office appt for preop clearance. Pt asked if I could call her back as her transmission just went on her car and her son is holding on the other line. I stated no problem and I will call her later today.

## 2023-05-11 DIAGNOSIS — E1165 Type 2 diabetes mellitus with hyperglycemia: Secondary | ICD-10-CM | POA: Diagnosis not present

## 2023-05-11 DIAGNOSIS — E1149 Type 2 diabetes mellitus with other diabetic neurological complication: Secondary | ICD-10-CM | POA: Diagnosis not present

## 2023-05-11 DIAGNOSIS — Z794 Long term (current) use of insulin: Secondary | ICD-10-CM | POA: Diagnosis not present

## 2023-05-13 ENCOUNTER — Telehealth: Payer: Self-pay

## 2023-05-13 NOTE — Telephone Encounter (Addendum)
 Pt made aware of [name] NP recommendations: Request was sent to Dr. Coral Else to hold plavix

## 2023-05-13 NOTE — Telephone Encounter (Signed)
Please disregard previous message.

## 2023-05-13 NOTE — Telephone Encounter (Deleted)
 [facility] Pre-operative Risk Assessment     Request for surgical clearance:     Endoscopy Procedure  What type of surgery is being performed?     Upper Endoscopy  When is this surgery scheduled?     TBD  What type of clearance is required ?   Pharamcy  Are there any medications that need to be held prior to surgery and how long? Plavix , 5 days  Practice name and name of physician performing surgery?      [facility]/ Dr Avram  What is your office phone and fax number?      Phone- [phone]  Fax- [phone]  Anesthesia type (None, local, MAC, general) ?       MAC   Please route your response to Elspeth Munroe RN

## 2023-05-13 NOTE — Telephone Encounter (Signed)
 [facility] Pre-operative Risk Assessment     Request for surgical clearance:     Endoscopy Procedure  What type of surgery is being performed?     Upper Endoscopy  When is this surgery scheduled?     TBD  What type of clearance is required ?   Pharmacy  Are there any medications that need to be held prior to surgery and how long? Plavix  5 days  Practice name and name of physician performing surgery?      [facility], Dr Avram  What is your office phone and fax number?      Phone- [phone]  Fax- [phone]  Anesthesia type (None, local, MAC, general) ?       MAC   Please route your response to Elspeth Munroe RN

## 2023-05-15 NOTE — Progress Notes (Deleted)
 Cardiology Clinic Note   Patient Name: JOEANNE ROBICHEAUX Date of Encounter: 05/15/2023  Primary Care Provider:  Sheldon Netter, [place] Primary Cardiologist:  Dorn Lesches, MD  Patient Profile    LANEISHA MINO 68 year old female presents to the clinic today for follow-up evaluation of her peripheral arterial disease, hyperlipidemia, and coronary artery disease.  Past Medical History    Past Medical History:  Diagnosis Date   Allergic urticaria 07/10/2015   Allergy     Anemia    hx   Angioedema 07/10/2015   Anxiety    Arthritis    neck, left hand (09/14/2012)   Asthma    Cancer (HCC) 1985   ovarian, no treatment except surgery   Cataract    Complication of anesthesia    OCCASIONAL TROUBLE TURNING NECK TO RIGHT   Critical lower limb ischemia (HCC)    10/2014 s/p L SFA stenting   Diverticulosis of colon with hemorrhage April 2013   GERD (gastroesophageal reflux disease)    GI bleed    H/O hiatal hernia    Heart attack (HCC)    2003 mild MI, March 2013 mild MI   Hidradenitis    groin   History of blood transfusion 1985 AND 2013   Hyperlipidemia    Hypertension    Irritable bowel syndrome    Left-sided weakness    since stroke, left eye trouble seeing   Migraines    Mild CAD    a. Cath 09/2010: mild luminal irregularities of LAD, 30% prox RCA and 20-30% mRCA, EF 65%.   Neuropathy    Obesity    Osteoporosis    PAD (peripheral artery disease) (HCC)    a. critical limb ischemia s/p PTA/stenting of L SFA 10/2014. c. occ prior SFA stent by angio 01/2016, for possible PV bypass.   Pneumonia    baby   Recurrent upper respiratory infection (URI)    S/P arterial stent-mid Lt SFA 11/03/14 11/04/2014   Schatzki's ring    Sinus problem    Stomach ulcer 1972   non-bleeding   Stroke (HCC) 07-2007, 07-2008, 07-2009   total 3 strokes; mild left sided weakness and left eye jumps.   Type II diabetes mellitus (HCC)    Vitamin B 12 deficiency 07-15-2013   Past Surgical History:   Procedure Laterality Date   ABDOMINAL AORTOGRAM W/LOWER EXTREMITY Left 10/01/2022   Procedure: ABDOMINAL AORTOGRAM W/LOWER EXTREMITY;  Surgeon: Serene Gaile ORN, MD;  [facility];  Service: Cardiovascular;  Laterality: Left;   ABDOMINAL HYSTERECTOMY  1985   ADENOIDECTOMY     ANTERIOR CERVICAL DECOMP/DISCECTOMY FUSION  2002   ANTERIOR CERVICAL DECOMP/DISCECTOMY FUSION N/A 04/08/2014   Procedure: Cervical Six-Seven ANTERIOR CERVICAL DECOMPRESSION/DISCECTOMY FUSION Plating and Bonegraft  2 LEVELS;  Surgeon: Rockey Peru, MD;  Location: [facility];  Service: Neurosurgery;  Laterality: N/A;  Cervical Six-Seven ANTERIOR CERVICAL DECOMPRESSION/DISCECTOMY FUSION Plating and Bonegraft  2 LEVELS   APPENDECTOMY  1985   AXILLARY HIDRADENITIS EXCISION  1990-2008   bilateral   BACK SURGERY     BREAST BIOPSY Right 2007   BREAST CYST EXCISION Right 2008   BREAST REDUCTION SURGERY     CARDIAC CATHETERIZATION  2004   mild disease   CATARACT EXTRACTION W/PHACO Left 05/16/2015   Procedure: CATARACT EXTRACTION PHACO AND INTRAOCULAR LENS PLACEMENT (IOC);  Surgeon: Oneil Platts, MD;  Location: [facility];  Service: Ophthalmology;  Laterality: Left;  CDE: 4.24   CATARACT EXTRACTION W/PHACO Right 05/30/2015   Procedure: CATARACT  EXTRACTION RIGHT EYE PHACO AND INTRAOCULAR LENS PLACEMENT ;  Surgeon: Oneil Platts, MD;  Location: [facility];  Service: Ophthalmology;  Laterality: Right;  CDE:4.08   CHOLECYSTECTOMY  1990's   COLONOSCOPY  08/12/2011   Procedure: COLONOSCOPY;  Surgeon: Gwendlyn ONEIDA Buddy, MD,FACG;  Location: [facility] ENDOSCOPY;  Service: Endoscopy;  Laterality: N/A;   COLONOSCOPY  06/19/2006   COLONOSCOPY WITH PROPOFOL  N/A 07/02/2019   Procedure: COLONOSCOPY WITH PROPOFOL ;  Surgeon: Golda Claudis PENNER, MD;  Location: [facility];  Service: Endoscopy;  Laterality: N/A;   cyst thigh Right    ESOPHAGOGASTRODUODENOSCOPY  08/12/2011   Procedure: ESOPHAGOGASTRODUODENOSCOPY (EGD);  Surgeon: Gwendlyn ONEIDA Buddy, MD,FACG;   Location: [facility] ENDOSCOPY;  Service: Endoscopy;  Laterality: N/A;   ESOPHAGOGASTRODUODENOSCOPY  06/04/2005   ESOPHAGOGASTRODUODENOSCOPY (EGD) WITH PROPOFOL  N/A 07/01/2019   Procedure: ESOPHAGOGASTRODUODENOSCOPY (EGD) WITH PROPOFOL ;  Surgeon: Golda Claudis PENNER, MD;  [facility]: [facility];  Service: Endoscopy;  Laterality: N/A;   FEMORAL-POPLITEAL BYPASS GRAFT Left 06/19/2016   Procedure: Left Leg BYPASS GRAFT FEMORAL-POPLITEAL ARTERY;  Surgeon: Gaile LELON New, MD;  Location: [facility];  Service: Vascular;  Laterality: Left;   GIVENS CAPSULE STUDY  08/13/2011   Procedure: GIVENS CAPSULE STUDY;  Surgeon: Gwendlyn ONEIDA Buddy, MD,FACG;  Location: [facility] ENDOSCOPY;  Service: Endoscopy;  Laterality: N/A;   HAMMER TOE SURGERY Bilateral ~ 2000   HEMOSTASIS CLIP PLACEMENT  07/02/2019   Procedure: HEMOSTASIS CLIP PLACEMENT;  Surgeon: Golda Claudis PENNER, MD;  [facility]: [facility];  Service: Endoscopy;;  hepatic flexure   HIATAL HERNIA REPAIR     HYDRADENITIS EXCISION  01/2011; 03/2012   'groin and abdomen; 03/2012 (09/14/2012)   HYDRADENITIS EXCISION  04/01/2012   Procedure: EXCISION HYDRADENITIS GROIN;  Surgeon: Donnice KATHEE Lunger, MD;  Location: [facility] ORS;  Service: General;  Laterality: Bilateral;  Excision of Hydradenitis of Perineum   HYDRADENITIS EXCISION N/A 09/17/2013   Procedure: EXCISION PERINEAL HIDRADENITIS ;  Surgeon: Donnice KATHEE Lunger, MD;  Location: [facility] ORS;  Service: General;  Laterality: N/A;  also in the pubis area   LEFT HEART CATH AND CORONARY ANGIOGRAPHY N/A 12/26/2016   Procedure: LEFT HEART CATH AND CORONARY ANGIOGRAPHY;  Surgeon: Jordan, Peter M, MD;  Location: [facility] INVASIVE CV LAB;  Service: Cardiovascular;  Laterality: N/A;   MASS EXCISION Right 09/17/2013   Procedure: EXCISION MASS;  Surgeon: Donnice KATHEE Lunger, MD;  Location: [facility] ORS;  Service: General;  Laterality: Right;   NISSEN FUNDOPLICATION  1996   PERIPHERAL VASCULAR CATHETERIZATION N/A 11/03/2014   Procedure: Lower Extremity Angiography;  Surgeon:  Dorn JINNY Lesches, MD;  [facility];  Service: Cardiovascular;  Laterality: N/A;   PERIPHERAL VASCULAR CATHETERIZATION N/A 01/29/2016   Procedure: Lower Extremity Angiography;  Surgeon: Dorn JINNY Lesches, MD;  Location: [facility] INVASIVE CV LAB;  Service: Cardiovascular;  Laterality: N/A;   PERIPHERAL VASCULAR INTERVENTION Left 10/01/2022   Procedure: PERIPHERAL VASCULAR INTERVENTION;  Surgeon: New Gaile LELON, MD;  [facility]: [facility];  Service: Cardiovascular;  Laterality: Left;  fem-pop bypass   PERIPHERAL VASCULAR THROMBECTOMY Left 10/01/2022   Procedure: PERIPHERAL VASCULAR THROMBECTOMY;  Surgeon: New Gaile LELON, MD;  [facility];  Service: Cardiovascular;  Laterality: Left;  fem-pop bypass   POLYPECTOMY  07/02/2019   Procedure: POLYPECTOMY;  Surgeon: Golda Claudis PENNER, MD;  [facility]: [facility];  Service: Endoscopy;;   POSTERIOR LUMBAR FUSION  2008 X 2   REDUCTION MAMMAPLASTY  1996?   TONSILLECTOMY AND ADENOIDECTOMY  1959 AND 2000  UVULOPALATOPHARYNGOPLASTY, TONSILLECTOMY AND SEPTOPLASTY  2000's    Allergies  Allergies  Allergen Reactions   Sulfa  Antibiotics Shortness Of Breath and Palpitations   Codeine Other (See Comments)    Recovering Addict does not like to take Narcotics   Fish Allergy  Hives and Swelling    Tongue swelling   Iodine  Swelling   Metformin  And Related Diarrhea   Soliqua [Insulin  Glargine-Lixisenatide] Itching and Other (See Comments)    tongue swelling   Shellfish Allergy  Swelling and Rash    Tongue swelling   Tape Rash    Paper tape is ok    History of Present Illness    JENNYE RUNQUIST has a PMH of HTN, PAD, hyperlipidemia, mild coronary artery disease, tobacco abuse, and diabetes.  Her PMH also includes prior CVA.  She was seen and evaluated by Dr. Court last on 03/12/2022.  She was initially referred by Dr.Jah from podiatry.  She was referred for evaluation of her critical limb ischemia.  She reported a strong  family history of heart disease.  Her father had bypass surgery and died at age 64 of MI.  She had lower extremity ABIs which showed left ABI 0.6 with an occluded left SFA and one-vessel runoff.  Her angiogram 11/03/2014 showed occluded left SFA.  Dr.Berry performed [facility] 1 directional arthrectomy, PTA and stenting.  She did well clinically and her pain resolved.  Her lower extremity Dopplers normalized.  She was seen in follow-up and noted for episodes of night chest discomfort that was responsive to nitroglycerin .  She underwent repeat Dopplers which showed a decline in her left ABI from 1.1-0.91 with increased velocity in her mid left SFA.  Her Dopplers on 12/20/2015 showed further reduction of left ABI down to 0.71 with a high frequency signal in her mid left SFA and worsening symptoms of claudication.  She underwent angiography 01/29/2016 which showed occluded left SFA stent.  Dr. Serene performed femoropopliteal bypass grafting 06/09/16.  Her symptoms resolved.  She underwent cardiac catheterization by Dr. Jordan for chest discomfort 12/26/2016.  She was noted to have minimal coronary disease.  She returned to smoking 10 cigarettes/day.  She was seen in follow-up by Dr. Court on 03/12/2022.  During that time she continued to do well.  She was vaping.  She denied chest pain.  She denied shortness of breath and claudication.  She followed up with vascular surgery on 11/04/2022.  She continued to do well from a vascular standpoint.  She was without left leg complaints.  She presents to the clinic today for follow-up evaluation and states***.  *** denies chest pain, shortness of breath, lower extremity edema, fatigue, palpitations, melena, hematuria, hemoptysis, diaphoresis, weakness, presyncope, syncope, orthopnea, and PND.  Essential hypertension-BP today***. Maintain blood pressure log Continue current medical therapy Low-sodium diet  Hyperlipidemia-LDL***. High-fiber diet Continue aspirin , Plavix ,  rosuvastatin   Peripheral arterial disease-denies lower extremity claudication.  Has been walking somewhat regularly. Continue current medical therapy High-fiber heart healthy low-sodium diet Increase physical activity as tolerated Continue current medical therapy ABIs followed by Dr. Serene  Coronary artery disease-denies recent episodes of chest discomfort and anginal type symptoms.  Previously underwent cardiac catheterization by Dr. Jordan 8/18.  She was noted to have minimal nonobstructive plaque. Continue aspirin , rosuvastatin , Plavix   Tobacco abuse-continues to use vape pen regularly. Smoking cessation strongly encouraged   Disposition: Follow-up with Dr.Berry or me in 6 months.  Home Medications    Prior to Admission medications   Medication Sig Start Date End Date Taking?  Authorizing Provider  acetaminophen  (TYLENOL ) 325 MG tablet Take 2 tablets (650 mg total) by mouth every 6 (six) hours as needed for mild pain (or Fever >/= 101). 07/02/19   Emokpae, Courage, MD  albuterol  (PROAIR  HFA) 108 (90 Base) MCG/ACT inhaler 1-2 inhalations every 4-6 hours as needed for cough or wheeze. Patient taking differently: Inhale 1-2 puffs into the lungs every 4 (four) hours as needed for shortness of breath. 07/10/15   Bobbitt, Elgin Pepper, MD  ALPRAZolam  (XANAX ) 1 MG tablet Take 1 mg by mouth 2 (two) times daily as needed for anxiety.    [provider]  aspirin  EC 81 MG tablet Take 81 mg by mouth daily.    [provider]  Azelastine  HCl 0.15 % SOLN Place 2 sprays into both nostrils 2 (two) times daily. Patient taking differently: Place 2 sprays into both nostrils daily as needed (allergies). 07/10/15   Bobbitt, Elgin Pepper, MD  bisacodyl  (DULCOLAX) 5 MG EC tablet Take 5 mg by mouth daily as needed for moderate constipation. 08/19/19   Avram Lupita BRAVO, MD  cephALEXin  (KEFLEX ) 500 MG capsule Take 1 capsule (500 mg total) by mouth 4 (four) times daily. 02/18/23   Steinl, Kevin, MD   clopidogrel  (PLAVIX ) 75 MG tablet Take 1 tablet (75 mg total) by mouth daily. 10/01/22   Serene Gaile ORN, MD  Continuous Blood Gluc Receiver (DEXCOM G6 RECEIVER) DEVI by Does not apply route.    [provider]  Continuous Glucose Sensor (DEXCOM G7 SENSOR) MISC 1 Device by Does not apply route as directed. 11/29/22   [name], Ibtehal Jaralla, MD  doxycycline  (VIBRAMYCIN ) 100 MG capsule Take 1 capsule (100 mg total) by mouth 2 (two) times daily. Patient taking differently: Take 100 mg by mouth as needed (Infection). 02/20/21   Rolinda Rogue, MD  EPINEPHrine  0.3 mg/0.3 mL IJ SOAJ injection Inject 0.3 mLs (0.3 mg total) into the muscle once. 08/02/15   Bobbitt, Elgin Pepper, MD  famotidine  (PEPCID ) 40 MG tablet Take 40 mg by mouth daily. 04/23/23   [provider]  hydrocortisone  (ANUSOL -HC) 2.5 % rectal cream Place rectally daily as needed for hemorrhoids. 09/16/22   [provider]  Insulin  Disposable Pump (OMNIPOD 5 G7 INTRO, GEN 5,) KIT 1 Device by Does not apply route every other day. 11/29/22   [name], Ibtehal Jaralla, MD  Insulin  Disposable Pump (OMNIPOD 5 G7 PODS, GEN 5,) MISC 1 Device by Does not apply route every other day. 11/29/22   [name], Ibtehal Jaralla, MD  insulin  lispro (HUMALOG ) 100 UNIT/ML injection Max daily 60 units 03/13/23   [name], Ibtehal Jaralla, MD  losartan -hydrochlorothiazide  (HYZAAR) 50-12.5 MG tablet Take 1 tablet by mouth daily. 08/18/19   Jerilynn Lamarr HERO, NP  lubiprostone  (AMITIZA ) 8 MCG capsule Take 1 capsule (8 mcg total) by mouth 2 (two) times daily with a meal. 01/09/23   Avram Lupita BRAVO, MD  Magnesium  200 MG TABS Take 1 tablet (200 mg total) by mouth daily. Patient taking differently: Take 200 mg by mouth 2 (two) times daily. 09/17/22   Avram Lupita BRAVO, MD  montelukast  (SINGULAIR ) 10 MG tablet Take 10 mg by mouth at bedtime as needed (allergies). 02/22/22   [provider]  mupirocin  ointment (BACTROBAN ) 2 % Apply 1  application topically 2 (two) times daily. Patient taking differently: Apply 1 application  topically 2 (two) times daily as needed. 06/02/19   Wurst, Brittany, PA-C  nitroGLYCERIN  (NITROSTAT ) 0.4 MG SL tablet PLACE 1 TAB UNDER TONGUE EVERY 5 MINS  AS NEEDED FOR CHEST PAIN - MAX 3 DOSES THEN 911 12/06/20   Court Dorn PARAS, MD  nortriptyline  (PAMELOR ) 10 MG capsule Take 1 capsule (10 mg total) by mouth at bedtime. Patient taking differently: Take 10 mg by mouth at bedtime as needed for sleep. 09/17/22   Avram Lupita BRAVO, MD  pantoprazole  (PROTONIX ) 40 MG tablet TAKE 1 TABLET BY MOUTH EVERY DAY Patient taking differently: Take 40 mg by mouth daily. 01/07/17   Court Dorn PARAS, MD  polyethylene glycol (MIRALAX  / GLYCOLAX ) 17 g packet Take 17 g by mouth daily as needed for moderate constipation or severe constipation. 08/19/19   Avram Lupita BRAVO, MD  potassium chloride  (KLOR-CON ) 10 MEQ tablet Take 10 mEq by mouth 2 (two) times daily. 10/01/19   [provider]  rosuvastatin  (CRESTOR ) 40 MG tablet Take 1 tablet (40 mg total) by mouth daily. 05/27/22   [name], Ibtehal Jaralla, MD  SUMAtriptan  (IMITREX ) 100 MG tablet Take 100 mg by mouth every 2 (two) hours as needed for migraine.     [provider]  zolpidem  (AMBIEN ) 5 MG tablet Take 5 mg by mouth at bedtime as needed. 04/28/23   [provider]    Family History    Family History  Problem Relation Age of Onset   Breast cancer Mother    Heart disease Mother    Hypertension Mother    Diabetes Father    Heart disease Father    Stroke Father    Heart disease Sister    Hypertension Sister    Hypertension Sister    Hyperlipidemia Sister    Diabetes Sister    Allergic rhinitis Sister    Emphysema Other        great uncle   Aneurysm Sister        brain   Colon cancer Paternal Uncle    Angioedema Neg Hx    Asthma Neg Hx    Eczema Neg Hx    Immunodeficiency Neg Hx    Urticaria Neg Hx    She indicated that her mother  is deceased. She indicated that her father is deceased. She indicated that two of her four sisters are alive. She indicated that the status of her paternal uncle is unknown. She indicated that the status of her neg hx is unknown. She indicated that the status of her other is unknown.  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 Court per pt.  Vaping Use   Vaping status: Every Day   Substances: Nicotine  Substance and Sexual Activity   Alcohol use: No    Alcohol/week: 0.0 standard drinks of alcohol   Drug use: No    Types: Crack cocaine    Comment: 05/09/2015.  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   She is a cigarette smoker, no alcohol no current drug use history of crack cocaine   Social Drivers of Corporate Investment Banker Strain: Low Risk  (02/06/2022)   Overall  Financial Resource Strain (CARDIA)    Difficulty of Paying Living Expenses: Not very hard  Food Insecurity: No Food Insecurity (05/22/2022)   Hunger Vital Sign    Worried About Running Out of Food in the Last Year: Never true    Ran Out of Food in the Last Year: Never true  Transportation Needs: No Transportation Needs (05/22/2022)   PRAPARE - Administrator, Civil Service (Medical): No    Lack of Transportation (Non-Medical): No  Physical Activity: Not on file  Stress: Stress Concern Present (02/17/2023)   [employer] of Occupational Health - Occupational Stress Questionnaire    Feeling of Stress : Rather much  Social Connections: Moderately Integrated (03/15/2022)   Social Connection and Isolation Panel [NHANES]    Frequency of Communication with Friends and Family: More than three times a week    Frequency of Social Gatherings with Friends and Family: More than three times a week    Attends Religious Services: 1 to 4 times per year    Active Member of [employer] or Organizations: Yes    Attends Banker Meetings: 1 to 4 times per year    Marital Status: Divorced  Catering Manager Violence: Not At Risk (02/06/2022)   Humiliation, Afraid, Rape, and Kick questionnaire    Fear of Current or Ex-Partner: No    Emotionally Abused: No    Physically Abused: No    Sexually Abused: No     Review of Systems    General:  No chills, fever, night sweats or weight changes.  Cardiovascular:  No chest pain, dyspnea on exertion, edema, orthopnea, palpitations, paroxysmal nocturnal dyspnea. Dermatological: No rash, lesions/masses Respiratory: No cough, dyspnea Urologic: No hematuria, dysuria Abdominal:   No nausea, vomiting, diarrhea, bright red blood per rectum, melena, or hematemesis Neurologic:  No visual changes, wkns, changes in mental status. All other systems reviewed and are otherwise negative except as noted above.  Physical Exam    VS:  There were no vitals  taken for this visit. , BMI There is no height or weight on file to calculate BMI. GEN: Well nourished, well developed, in no acute distress. HEENT: normal. Neck: Supple, no JVD, carotid bruits, or masses. Cardiac: RRR, no murmurs, rubs, or gallops. No clubbing, cyanosis, edema.  Radials/DP/PT 2+ and equal bilaterally.  Respiratory:  Respirations regular and unlabored, clear to auscultation bilaterally. GI: Soft, nontender, nondistended, BS + x 4. MS: no deformity or atrophy. Skin: warm and dry, no rash. Neuro:  Strength and sensation are intact. Psych: Normal affect.  Accessory Clinical Findings    Recent Labs: 02/17/2023: ALT 20 05/09/2023: BUN 13; Creatinine, Ser 0.86; Hemoglobin 12.5; Platelets 342.0; Potassium 3.5; Sodium 141   Recent Lipid Panel    Component Value Date/Time   CHOL 126 06/15/2021  1622   TRIG 118 06/15/2021 1622   HDL 44 (L) 06/15/2021 1622   CHOLHDL 2.9 06/15/2021 1622   VLDL 35 07/06/2011 0500   LDLCALC 62 06/15/2021 1622    No BP recorded.  {Refresh Note OR Click here to enter BP  :1}***    ECG personally reviewed by me today- ***     Echocardiogram 06/21/2019  IMPRESSIONS     1. Left ventricular ejection fraction, by estimation, is 65 to 70%. The  left ventricle has hyperdynamic function. The left ventricle has no  regional wall motion abnormalities. There is mild concentric left  ventricular hypertrophy. Left ventricular  diastolic parameters are consistent with Grade I diastolic dysfunction  (impaired relaxation).   2. Right ventricular systolic function is normal. The right ventricular  size is normal.   3. The mitral valve is grossly normal. No evidence of mitral valve  regurgitation.   4. The aortic valve is tricuspid. Aortic valve regurgitation is not  visualized. No aortic stenosis is present.   5. The inferior vena cava is normal in size with greater than 50%  respiratory variability, suggesting right atrial pressure of 3 mmHg.    FINDINGS   Left Ventricle: Left ventricular ejection fraction, by estimation, is 65  to 70%. The left ventricle has hyperdynamic function. The left ventricle  has no regional wall motion abnormalities. There is mild concentric left  ventricular hypertrophy. Left  ventricular diastolic parameters are consistent with Grade I diastolic  dysfunction (impaired relaxation). Indeterminate filling pressures.   Right Ventricle: The right ventricular size is normal. No increase in  right ventricular wall thickness. Right ventricular systolic function is  normal.   Left Atrium: Left atrial size was normal in size.   Right Atrium: Right atrial size was normal in size.   Pericardium: There is no evidence of pericardial effusion.   Mitral Valve: The mitral valve is grossly normal. No evidence of mitral  valve regurgitation.   Tricuspid Valve: The tricuspid valve is grossly normal. Tricuspid valve  regurgitation is not demonstrated.   Aortic Valve: The aortic valve is tricuspid. Aortic valve regurgitation is  not visualized. No aortic stenosis is present.   Pulmonic Valve: The pulmonic valve was not well visualized. Pulmonic valve  regurgitation is not visualized.   Aorta: The aortic root is normal in size and structure.   Venous: The inferior vena cava is normal in size with greater than 50%  respiratory variability, suggesting right atrial pressure of 3 mmHg.   IAS/Shunts: No atrial level shunt detected by color flow Doppler.       Assessment & Plan   1.  ***  Preoperative cardiac evaluation-upper endoscopy, TBD, [place] [facility], Dr. Avram, fax #[phone].  Patient's Plavix  is prescribed by vascular surgery.  Recommendations for holding aspirin  will need to come from prescribing provider.      Primary Cardiologist: Dorn Lesches, MD  Chart reviewed as part of pre-operative protocol coverage. Given past medical history and time since last visit, based on ACC/AHA  guidelines, CHRISTIAN TREADWAY would be at acceptable risk for the planned procedure without further cardiovascular testing.   Her RCRI is high risk, greater than 11% risk of major cardiac event.  She is able to complete greater than 4 METS of physical activity.  Patient was advised that if she*** develops new symptoms prior to surgery to contact our office to arrange a follow-up appointment.  He verbalized understanding.  I will route this recommendation to the requesting party via Epic  fax function and remove from pre-op pool.     Josefa HERO. Shresta Risden NP-C     05/15/2023, 6:57 AM [facility] [address] Office [phone] Fax [phone]    I spent***minutes examining this patient, reviewing medications, and using patient centered shared decision making involving their cardiac care.   I spent greater than 20 minutes reviewing their past medical history,  medications, and prior cardiac tests.

## 2023-05-16 ENCOUNTER — Ambulatory Visit: Payer: 59 | Admitting: General Practice

## 2023-05-16 NOTE — Telephone Encounter (Signed)
 Called patient due to her being on the wait list for Dr. Court.  Patient's appt for today 01/10 with [name] at 2:20 pm was cancelled yesterday due to the office closing early for the inclement weather.  Appt was rescheduled for the first available 01/13 with Dr. Court at 10:30 am.

## 2023-05-19 ENCOUNTER — Ambulatory Visit: Payer: 59 | Attending: Cardiovascular Disease | Admitting: Cardiovascular Disease

## 2023-05-19 ENCOUNTER — Ambulatory Visit: Payer: 59 | Admitting: General Practice

## 2023-05-19 ENCOUNTER — Encounter: Payer: Self-pay | Admitting: Cardiovascular Disease

## 2023-05-19 ENCOUNTER — Telehealth: Payer: Self-pay

## 2023-05-19 VITALS — BP 136/86 | HR 100 | Ht 63.0 in | Wt 128.4 lb

## 2023-05-19 DIAGNOSIS — E785 Hyperlipidemia, unspecified: Secondary | ICD-10-CM

## 2023-05-19 DIAGNOSIS — I739 Peripheral vascular disease, unspecified: Secondary | ICD-10-CM

## 2023-05-19 DIAGNOSIS — Z720 Tobacco use: Secondary | ICD-10-CM

## 2023-05-19 DIAGNOSIS — I10 Essential (primary) hypertension: Secondary | ICD-10-CM

## 2023-05-19 DIAGNOSIS — R1012 Left upper quadrant pain: Secondary | ICD-10-CM

## 2023-05-19 DIAGNOSIS — K219 Gastro-esophageal reflux disease without esophagitis: Secondary | ICD-10-CM

## 2023-05-19 DIAGNOSIS — R1011 Right upper quadrant pain: Secondary | ICD-10-CM

## 2023-05-19 NOTE — Telephone Encounter (Signed)
 Per Dr Nanetta Batty okay to hold the clopidogrel 5 days prior to her procedure. Tiffani brought [place] a letter he typed up for her with this information.

## 2023-05-19 NOTE — Telephone Encounter (Signed)
 Error

## 2023-05-19 NOTE — Telephone Encounter (Signed)
 Agree that we should double check with Dr. Myra Gianotti   Looks like she sees him 1/20

## 2023-05-19 NOTE — Assessment & Plan Note (Signed)
 Discontinue tobacco abuse although she still vapes

## 2023-05-19 NOTE — Telephone Encounter (Signed)
 Inbound call from patient, would like to know if she can be scheduled for the endoscopy prior to the month being over. Patient states her short term disability will not be active after that.

## 2023-05-19 NOTE — Progress Notes (Signed)
 05/19/2023 [name]   05-20-1955  [id number]  Primary Physician Sheldon Netter, PA Primary Cardiologist: Dorn JINNY Lesches MD [facility] [place], [place]  HPI:  [name] is a 68 y.o.    thin-appearing divorced African-American female mother of one, grandmother of one grandchild who is currently disabled because of a prior stroke. She was referred by Dr. Fulton, from [facility] podiatry, for evaluation and treatment of critical limb ischemia. I last saw her in the office 03/12/2022. Her cardiovascular risk factor profile is notable for a strong family history of heart disease with the father about a stent, mother who had bypass surgery and a sister who died at age 68 of a myocardial infarction. She has never had a heart attack but apparently has had a stroke in the past with some mild left-sided residua. She has a history of tobacco abuse in the last 43 years of one third pack per day trying to quit currently. She has treated diabetes, hypertension and hyperlipidemia. She had the onset of left ear pain approximately  3 months ago with progression to critical limb ischemia in early June with ischemic appearing left fourth toe. Dopplers in our office performed yesterday revealed a left ABI 0.6 with an occluded left SFA and one-vessel runoff. She will need to be admitted for angiography and potential endovascular therapy for critical limb ischemia. Angiogram to her on 11/03/14 revealing occluded left SFA. I performed [facility] one directional atherectomy, PTA and stenting using a Viabahn  covered stent with an excellent angiographic and clinical result. Her pain has resolved. Her critical limb ischemia has  resolved. Her Dopplers have normalized. Since I saw her approximately 6 weeks ago she's had 4 episodes of night nitroglycerin  responsive chest pain. Recent Dopplers did show a decline in her left ABI from 1.1 6 months ago to .91 with a simultaneous increase in velocity in her mid left SFA. Dopplers  performed 12/20/15 revealed a further reduction in her left ABI down to 0.71 with a high-frequency signal in her mid left SFA and worsening symptoms of claudication    I performed angiography on her 01/29/2016 revealing an occluded left SFA stent.  She ultimately underwent left femoropopliteal bypass grafting 06/09/2016 by Dr. Serene using PTFE.  He follows his noninvasively in his office.  Her symptoms resolved.  She also underwent cardiac catheterization by Dr. Jordan because of chest pain 12/26/2016 revealing minimal CAD.     Since I saw her in the office a year and a half ago she continues to do well.  She is vaping however.  She denies chest pain, shortness of breath or claudication.  She apparently had some melenic stools and needs upper endoscopy by Dr. Avram.  I am clearing her low risk and she is able to stop her Plavix  for this.  She did have a peripheral angiogram performed by Dr. Serene 09/23/2022 because of left lower extremity pain.  She ended up having an occluded left femoropopliteal bypass graft which underwent endovascular treatment using Jedi  mechanical thrombectomy and Viabahn stenting.   Current Meds  Medication Sig   acetaminophen  (TYLENOL ) 325 MG tablet Take 2 tablets (650 mg total) by mouth every 6 (six) hours as needed for mild pain (or Fever >/= 101).   albuterol  (PROAIR  HFA) 108 (90 Base) MCG/ACT inhaler 1-2 inhalations every 4-6 hours as needed for cough or wheeze. (Patient taking differently: Inhale 1-2 puffs into the lungs every 4 (four) hours as needed for shortness of breath.)  ALPRAZolam  (XANAX ) 1 MG tablet Take 1 mg by mouth 2 (two) times daily as needed for anxiety.   aspirin  EC 81 MG tablet Take 81 mg by mouth daily.   Azelastine  HCl 0.15 % SOLN Place 2 sprays into both nostrils 2 (two) times daily. (Patient taking differently: Place 2 sprays into both nostrils daily as needed (allergies).)   bisacodyl  (DULCOLAX) 5 MG EC tablet Take 5 mg by mouth daily as needed for  moderate constipation.   clopidogrel  (PLAVIX ) 75 MG tablet Take 1 tablet (75 mg total) by mouth daily.   Continuous Blood Gluc Receiver (DEXCOM G6 RECEIVER) DEVI by Does not apply route.   Continuous Glucose Sensor (DEXCOM G7 SENSOR) MISC 1 Device by Does not apply route as directed.   doxycycline  (VIBRAMYCIN ) 100 MG capsule Take 1 capsule (100 mg total) by mouth 2 (two) times daily. (Patient taking differently: Take 100 mg by mouth as needed (Infection).)   EPINEPHrine  0.3 mg/0.3 mL IJ SOAJ injection Inject 0.3 mLs (0.3 mg total) into the muscle once.   famotidine  (PEPCID ) 40 MG tablet Take 40 mg by mouth daily.   hydrocortisone  (ANUSOL -HC) 2.5 % rectal cream Place rectally daily as needed for hemorrhoids.   Insulin  Disposable Pump (OMNIPOD 5 G7 INTRO, GEN 5,) KIT 1 Device by Does not apply route every other day.   Insulin  Disposable Pump (OMNIPOD 5 G7 PODS, GEN 5,) MISC 1 Device by Does not apply route every other day.   insulin  lispro (HUMALOG ) 100 UNIT/ML injection Max daily 60 units   losartan -hydrochlorothiazide  (HYZAAR) 50-12.5 MG tablet Take 1 tablet by mouth daily.   lubiprostone  (AMITIZA ) 8 MCG capsule Take 1 capsule (8 mcg total) by mouth 2 (two) times daily with a meal.   Magnesium  200 MG TABS Take 1 tablet (200 mg total) by mouth daily. (Patient taking differently: Take 200 mg by mouth 2 (two) times daily.)   montelukast  (SINGULAIR ) 10 MG tablet Take 10 mg by mouth at bedtime as needed (allergies).   mupirocin  ointment (BACTROBAN ) 2 % Apply 1 application topically 2 (two) times daily. (Patient taking differently: Apply 1 application  topically 2 (two) times daily as needed.)   nitroGLYCERIN  (NITROSTAT ) 0.4 MG SL tablet PLACE 1 TAB UNDER TONGUE EVERY 5 MINS AS NEEDED FOR CHEST PAIN - MAX 3 DOSES THEN 911   nortriptyline  (PAMELOR ) 10 MG capsule Take 1 capsule (10 mg total) by mouth at bedtime. (Patient taking differently: Take 10 mg by mouth at bedtime as needed for sleep.)    pantoprazole  (PROTONIX ) 40 MG tablet TAKE 1 TABLET BY MOUTH EVERY DAY (Patient taking differently: Take 40 mg by mouth daily.)   polyethylene glycol (MIRALAX  / GLYCOLAX ) 17 g packet Take 17 g by mouth daily as needed for moderate constipation or severe constipation.   potassium chloride  (KLOR-CON ) 10 MEQ tablet Take 10 mEq by mouth 2 (two) times daily.   rosuvastatin  (CRESTOR ) 40 MG tablet Take 1 tablet (40 mg total) by mouth daily.   sucralfate  (CARAFATE ) 1 g tablet Take 1 g by mouth 2 (two) times daily.   SUMAtriptan  (IMITREX ) 100 MG tablet Take 100 mg by mouth every 2 (two) hours as needed for migraine.    zolpidem  (AMBIEN ) 5 MG tablet Take 5 mg by mouth at bedtime as needed.     Allergies  Allergen Reactions   Sulfa  Antibiotics Shortness Of Breath and Palpitations   Codeine Other (See Comments)    Recovering Addict does not like to take Narcotics   Fish  Allergy  Hives and Swelling    Tongue swelling   Iodine  Swelling   Metformin  And Related Diarrhea   Soliqua [Insulin  Glargine-Lixisenatide] Itching and Other (See Comments)    tongue swelling   Shellfish Allergy  Swelling and Rash    Tongue swelling   Tape Rash    Paper tape is ok    Social History   Socioeconomic History   Marital status: Divorced    Spouse name: Not on file   Number of children: 4   Years of education: some college   Highest education level: Some college, no degree  Occupational History   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 Court per pt.  Vaping Use   Vaping status: Every Day   Substances: Nicotine  Substance and Sexual  Activity   Alcohol use: No    Alcohol/week: 0.0 standard drinks of alcohol   Drug use: No    Types: Crack cocaine    Comment: 05/09/2015.  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   She is a cigarette smoker, no alcohol no current drug use history of crack cocaine   Social Drivers of Corporate Investment Banker Strain: Low Risk  (02/06/2022)   Overall Financial Resource Strain (CARDIA)    Difficulty of Paying Living Expenses: Not very hard  Food Insecurity: No Food Insecurity (05/22/2022)   Hunger Vital Sign    Worried About Running Out of Food in the Last Year: Never true    Ran Out of Food in the Last Year: Never true  Transportation Needs: No Transportation Needs (05/22/2022)   PRAPARE - Administrator, Civil Service (Medical): No    Lack of Transportation (Non-Medical): No  Physical Activity: Not on file  Stress: Stress Concern Present (02/17/2023)   [employer] of Occupational Health - Occupational Stress Questionnaire    Feeling of Stress : Rather much  Social Connections: Moderately Integrated (03/15/2022)   Social Connection and Isolation Panel [NHANES]    Frequency of Communication with Friends and Family: More than three times a week    Frequency of Social Gatherings with Friends and Family: More than three times a week    Attends Religious Services: 1 to 4 times per year    Active Member of [employer] or Organizations: Yes    Attends Banker Meetings: 1 to  4 times per year    Marital Status: Divorced  Intimate Partner Violence: Not At Risk (02/06/2022)   Humiliation, Afraid, Rape, and Kick questionnaire    Fear of Current or Ex-Partner: No  Emotionally Abused: No    Physically Abused: No    Sexually Abused: No     Review of Systems: General: negative for chills, fever, night sweats or weight changes.  Cardiovascular: negative for chest pain, dyspnea on exertion, edema, orthopnea, palpitations, paroxysmal nocturnal dyspnea or shortness of breath Dermatological: negative for rash Respiratory: negative for cough or wheezing Urologic: negative for hematuria Abdominal: negative for nausea, vomiting, diarrhea, bright red blood per rectum, melena, or hematemesis Neurologic: negative for visual changes, syncope, or dizziness All other systems reviewed and are otherwise negative except as noted above.    Blood pressure 136/86, pulse 100, height 5' 3 (1.6 m), weight 128 lb 6.4 oz (58.2 kg), SpO2 98%.  General appearance: alert and no distress Neck: no adenopathy, no carotid bruit, no JVD, supple, symmetrical, trachea midline, and thyroid  not enlarged, symmetric, no tenderness/mass/nodules Lungs: clear to auscultation bilaterally Heart: regular rate and rhythm, S1, S2 normal, no murmur, click, rub or gallop Extremities: extremities normal, atraumatic, no cyanosis or edema Pulses: 2+ and symmetric Skin: Skin color, texture, turgor normal. No rashes or lesions Neurologic: Grossly normal  EKG not performed today      ASSESSMENT AND PLAN:   Essential hypertension History of essential hypertension her blood pressure measured today at 136/86.  She is on losartan , and hydrochlorothiazide .  PAD (peripheral artery disease) (HCC) History of PAD status post peripheral angiography which I performed 01/29/2016 revealing an occluded left SFA stent which I put in 11/03/2014.  I referred her to Dr. Serene who ultimately performed left femoropopliteal  bypass grafting 06/09/16 using PTFE.  Because of recurrent symptoms she was reangiogram and by him 09/23/2022 revealing an occluded bypass graft.  He performed mechanical thrombectomy and restenting with restoration of flow.  He did also document a right common iliac artery stenosis.  Hyperlipidemia LDL goal <70 History of hyperlipidemia on statin therapy with lipid profile performed 10/04/2022 revealing a total cholesterol of 57, LDL 72 and HDL 52.  Tobacco abuse Discontinue tobacco abuse although she still vapes     Dorn DOROTHA Lesches MD [facility], [facility] 05/19/2023 11:16 AM

## 2023-05-19 NOTE — Assessment & Plan Note (Signed)
 History of hyperlipidemia on statin therapy with lipid profile performed 10/04/2022 revealing a total cholesterol of 57, LDL 72 and HDL 52.

## 2023-05-19 NOTE — Assessment & Plan Note (Signed)
 History of PAD status post peripheral angiography which I performed 01/29/2016 revealing an occluded left SFA stent which I put in 11/03/2014.  I referred her to Dr. Serene who ultimately performed left femoropopliteal bypass grafting 06/09/16 using PTFE.  Because of recurrent symptoms she was reangiogram and by him 09/23/2022 revealing an occluded bypass graft.  He performed mechanical thrombectomy and restenting with restoration of flow.  He did also document a right common iliac artery stenosis.

## 2023-05-19 NOTE — Assessment & Plan Note (Signed)
 History of essential hypertension her blood pressure measured today at 136/86.  She is on losartan, and hydrochlorothiazide.

## 2023-05-19 NOTE — Telephone Encounter (Signed)
 Contacted patient and patient is scheduled for upper endoscopy on 05/21/23 at 2 pm. Patient instructions were sent via MyChart & patient is aware to hold Plavix as of today.

## 2023-05-19 NOTE — Telephone Encounter (Signed)
 DD, patient will need to hold Plavix  for a total of 5 days prior to EGD so procedure date 05/21/2023 is too soon. EGD will need to be rescheduled when she can hold Plavix  for 5 days. I reviewed Dr. Ranee cardiac clearance and Plavix  hold instructions. However, prior communications verified that her vascular specialist prescribes her Plavix  as she had recent vascular procedures. Please contact Dr. Serene vascular specialist for Plavix  hold instructions.   Dr. Avram, please review cardiac clearance note. Verify if you are ok with Plavix  hold instructions from her cardiologist or wait for instructions from Dr. Serene.

## 2023-05-19 NOTE — Telephone Encounter (Signed)
 DD, pls contact patient, she should stay on her Plavix until we receive official Plavix hold instructions from her vascular specialist Dr Myra Gianotti. EGD to be scheduled once we receive Plavix hold clearance from Dr. Myra Gianotti. Tacy Learn.

## 2023-05-19 NOTE — Patient Instructions (Addendum)
 Medication Instructions:  Your physician recommends that you continue on your current medications as directed. Please refer to the Current Medication list given to you today.  *If you need a refill on your cardiac medications before your next appointment, please call your pharmacy*  Follow-Up: At [facility], you and your health needs are our priority.  As part of our continuing mission to provide you with exceptional heart care, we have created designated Provider Care Teams.  These Care Teams include your primary Cardiologist (physician) and Advanced Practice Providers (APPs -  Physician Assistants and Nurse Practitioners) who all work together to provide you with the care you need, when you need it.  We recommend signing up for the patient portal called MyChart.  Sign up information is provided on this After Visit Summary.  MyChart is used to connect with patients for Virtual Visits (Telemedicine).  Patients are able to view lab/test results, encounter notes, upcoming appointments, etc.  Non-urgent messages can be sent to your provider as well.   To learn more about what you can do with MyChart, go to [email or web address].    Your next appointment:   12 month(s)  Provider:   Dorn Lesches, MD     Other Instructions Ok to hold Plavix  for 5 days prior to Upper Endoscopy Procedure.

## 2023-05-21 ENCOUNTER — Encounter: Payer: 59 | Admitting: Internal Medicine

## 2023-05-21 NOTE — Telephone Encounter (Signed)
 Paper Fax was sent to Dr. Genny Kid by [facility] for request.

## 2023-05-22 DIAGNOSIS — E119 Type 2 diabetes mellitus without complications: Secondary | ICD-10-CM | POA: Diagnosis not present

## 2023-05-22 DIAGNOSIS — E559 Vitamin D deficiency, unspecified: Secondary | ICD-10-CM | POA: Diagnosis not present

## 2023-05-22 DIAGNOSIS — G4700 Insomnia, unspecified: Secondary | ICD-10-CM | POA: Diagnosis not present

## 2023-05-22 NOTE — Telephone Encounter (Signed)
Clearance was faxed to Dr.Brabham's office and have not heard anything back yet.

## 2023-05-23 NOTE — Telephone Encounter (Signed)
Still awaiting fax from Dr. Coral Else

## 2023-05-23 NOTE — Telephone Encounter (Signed)
Spoke with Harriett Sine from [place] office and Harriett Sine stated that Dr.Brabham would not be able to sign off on the clearance until Monday. She also stated that patient canceled appointment for Monday, 1/20.

## 2023-05-23 NOTE — Telephone Encounter (Signed)
Spoke with patient & advised her that we are waiting on clearance from Dr. Estanislado Spire office, and once received we will be in touch with her. Refer to alternate phone note 05/09/23 with ongoing discussion for clearance.

## 2023-05-23 NOTE — Telephone Encounter (Signed)
Spoke with patient & she says she received clearance from Dr. Allyson Sabal to proceed with procedure & come off Plavix. Letter is in [place] as well & documentation in OV note 05/19/23. Previous notes state that clearance needed to come from Dr. Myra Gianotti. Will confirm with Jill Side, NP if further clearance is needed.

## 2023-05-23 NOTE — Telephone Encounter (Signed)
Viviann Spare, RN has spoken with Jill Side, NP & further clearance is needed from Dr. Myra Gianotti. Refer back to phone note 05/09/23.

## 2023-05-23 NOTE — Telephone Encounter (Signed)
Patient called and stated that she needing the nurse to return her call as soon as possible. Patient stated that she spoke with another nurse and they told her that she would receive a call back from [place]. Patient stated she needed to get this taken care of due to her being in a lot of pain. Please advise.

## 2023-05-26 ENCOUNTER — Encounter (HOSPITAL_COMMUNITY): Payer: 59

## 2023-05-26 ENCOUNTER — Ambulatory Visit: Payer: 59 | Admitting: Surgery

## 2023-05-26 ENCOUNTER — Ambulatory Visit (HOSPITAL_COMMUNITY): Payer: 59

## 2023-05-27 NOTE — Telephone Encounter (Signed)
Dr. Leone Payor, the notes below as patient canceled her appointment with Dr. Myra Gianotti.  Please verify how you want to proceed regarding future EGD and Plavix instructions.

## 2023-05-27 NOTE — Telephone Encounter (Signed)
It is best to have her stop Plavix prior to procedure but if she is willing to undergo on Plavix we can do that but I may not be able to do certain procedures during the EGD.  So we can offer EGD on Plavix but she needs toi understand there is a slightly higher bleeding risk with that and some EGD-related procedures will not be done if I think the bleeding risk is too high - examples would be stretching the esophagus and removing polyps  [name] please contact her and explain  She should also follow-up with Dr. Myra Gianotti - needs to reschedule regardless of what we do

## 2023-05-28 ENCOUNTER — Other Ambulatory Visit: Payer: Self-pay

## 2023-05-28 DIAGNOSIS — K219 Gastro-esophageal reflux disease without esophagitis: Secondary | ICD-10-CM

## 2023-05-28 DIAGNOSIS — R1011 Right upper quadrant pain: Secondary | ICD-10-CM

## 2023-05-28 DIAGNOSIS — R1013 Epigastric pain: Secondary | ICD-10-CM

## 2023-05-28 DIAGNOSIS — R1012 Left upper quadrant pain: Secondary | ICD-10-CM

## 2023-05-28 NOTE — Telephone Encounter (Signed)
Fax received from Dr. Myra Gianotti office stating that pt has clearance to hold the plavix 5 days prior to EGD. Dr. Leone Payor made aware. Copy of faxed was made and original sent to be scanned into Epic. Pt was made aware of the clearance to hold the Plavix 5 days prior to EGD. Pt was scheduled for the EGD with Dr. Leone Payor on 06/05/2023 at 10:00 AM. Pt to arrive at 9:00 AM. Pt made aware. Ambulatory referral to GI placed in Epic. Prep sent to pt via my chart. Pt made aware.  Pt verbalized understanding with all questions answered.

## 2023-05-28 NOTE — Telephone Encounter (Signed)
Please refer to other phone note.

## 2023-05-28 NOTE — Telephone Encounter (Signed)
Patient returning call. Please advise

## 2023-05-29 ENCOUNTER — Encounter: Payer: Self-pay | Admitting: Internal Medicine

## 2023-05-29 NOTE — Telephone Encounter (Signed)
Refer to phone note 05/09/23.

## 2023-06-01 ENCOUNTER — Encounter: Payer: Self-pay | Admitting: Certified Registered Nurse Anesthetist

## 2023-06-05 ENCOUNTER — Ambulatory Visit: Payer: 59 | Admitting: Internal Medicine

## 2023-06-05 ENCOUNTER — Encounter: Payer: Self-pay | Admitting: Internal Medicine

## 2023-06-05 VITALS — BP 115/73 | HR 65 | Temp 97.7°F | Resp 9 | Ht 64.0 in | Wt 129.0 lb

## 2023-06-05 DIAGNOSIS — K2289 Other specified disease of esophagus: Secondary | ICD-10-CM | POA: Diagnosis not present

## 2023-06-05 DIAGNOSIS — K449 Diaphragmatic hernia without obstruction or gangrene: Secondary | ICD-10-CM

## 2023-06-05 DIAGNOSIS — Z9889 Other specified postprocedural states: Secondary | ICD-10-CM

## 2023-06-05 DIAGNOSIS — T859XXA Unspecified complication of internal prosthetic device, implant and graft, initial encounter: Secondary | ICD-10-CM | POA: Diagnosis not present

## 2023-06-05 DIAGNOSIS — K219 Gastro-esophageal reflux disease without esophagitis: Secondary | ICD-10-CM | POA: Diagnosis not present

## 2023-06-05 MED ORDER — SODIUM CHLORIDE 0.9 % IV SOLN: 500.0000 mL | Freq: Once | INTRAVENOUS | Status: DC

## 2023-06-05 MED ORDER — NORTRIPTYLINE HCL 10 MG PO CAPS: 10.0000 mg | ORAL_CAPSULE | Freq: Every day | ORAL | 11 refills | Status: AC

## 2023-06-05 NOTE — Op Note (Signed)
[facility] Patient Name: Bora Bost Procedure Date: 06/05/2023 10:08 AM MRN: [id number] Endoscopist: Iva Boop , MD, [id number] Age: 68 Referring MD:  Date of Birth: 1955-10-18 Gender: Female Account #: [id number] Procedure:                Upper GI endoscopy Indications:              Esophageal reflux symptoms that persist despite                            appropriate therapy Medicines:                Monitored Anesthesia Care Procedure:                Pre-Anesthesia Assessment:                           - Prior to the procedure, a History and Physical                            was performed, and patient medications and                            allergies were reviewed. The patient's tolerance of                            previous anesthesia was also reviewed. The risks                            and benefits of the procedure and the sedation                            options and risks were discussed with the patient.                            All questions were answered, and informed consent                            was obtained. Prior Anticoagulants: The patient                            last took Plavix (clopidogrel) 7 days prior to the                            procedure. ASA Grade Assessment: III - A patient                            with severe systemic disease. After reviewing the                            risks and benefits, the patient was deemed in                            satisfactory condition to undergo the procedure.  After obtaining informed consent, the endoscope was                            passed under direct vision. Throughout the                            procedure, the patient's blood pressure, pulse, and                            oxygen saturations were monitored continuously. The                            GIF [id number] #[id number] was introduced through the                            mouth, and advanced to  the second part of duodenum.                            The upper GI endoscopy was accomplished without                            difficulty. The patient tolerated the procedure                            well. Scope In: Scope Out: Findings:                 The Z-line was irregular and was found 35 cm from                            the incisors.                           A 2 cm hiatal hernia was present.                           Evidence of a Nissen fundoplication was found in                            the cardia. The wrap appeared loose.                           The exam was otherwise without abnormality.                           The cardia and gastric fundus were normal on                            retroflexion. Complications:            No immediate complications. Estimated Blood Loss:     Estimated blood loss: none. Impression:               - Z-line irregular, 35 cm from the incisors.                           -  2 cm hiatal hernia. tTwo areas of focal erythema                            as described on 2021 exam also.                           - A Nissen fundoplication was found. The wrap                            appears loose.                           - The examination was otherwise normal.                           - No specimens collected. Recommendation:           - Patient has a contact number available for                            emergencies. The signs and symptoms of potential                            delayed complications were discussed with the                            patient. Return to normal activities tomorrow.                            Written discharge instructions were provided to the                            patient.                           - GERD prevention diet.                           - Continue present medications.                           - needs to take nortriptyline 10 mg qhs instead of                            prn as I think she  has functional heartburn and                            dyspepsia                           - Resume Plavix (clopidogrel) at prior dose today. Iva Boop, MD 06/05/2023 10:25:57 AM This report has been signed electronically.

## 2023-06-05 NOTE — Progress Notes (Signed)
History and Physical Interval Note:  06/05/2023 10:02 AM  [name]  has presented today for endoscopic procedure(s), with the diagnosis of  Encounter Diagnosis  Name Primary?   Gastroesophageal reflux disease without esophagitis Yes  .  The various methods of evaluation and treatment have been discussed with the patient and/or family. After consideration of risks, benefits and other options for treatment, the patient has consented to  the endoscopic procedure(s).   The patient's history has been reviewed, patient examined, no change in status, stable for endoscopic procedure(s).  I have reviewed the patient's chart and labs.  Questions were answered to the patient's satisfaction.     Iva Boop, MD, Clement[facility] Graham

## 2023-06-05 NOTE — Progress Notes (Signed)
1010 Robinul 0.1 mg IV given due large amount of secretions upon assessment.  MD made aware, vss

## 2023-06-05 NOTE — Progress Notes (Signed)
Vitals-[name]  Pt's states no medical or surgical changes since previsit or office visit.

## 2023-06-05 NOTE — Progress Notes (Signed)
Report given to PACU, vss

## 2023-06-05 NOTE — Patient Instructions (Addendum)
The exam is ok - no problems seen - in fact it is same as 2021.  I think you need to take nortriptyline every night at bedtime - not just to help sleep. I believe it will relieve your symptoms.  I appreciate the opportunity to care for you. Iva Boop, MD, FACG   YOU HAD AN ENDOSCOPIC PROCEDURE TODAY AT THE [facility]:   Refer to the procedure report that was given to you for any specific questions about what was found during the examination.  If the procedure report does not answer your questions, please call your gastroenterologist to clarify.  If you requested that your care partner not be given the details of your procedure findings, then the procedure report has been included in a sealed envelope for you to review at your convenience later.  YOU SHOULD EXPECT: Some feelings of bloating in the abdomen. Passage of more gas than usual.  Walking can help get rid of the air that was put into your GI tract during the procedure and reduce the bloating. If you had a lower endoscopy (such as a colonoscopy or flexible sigmoidoscopy) you may notice spotting of blood in your stool or on the toilet paper. If you underwent a bowel prep for your procedure, you may not have a normal bowel movement for a few days.  Please Note:  You might notice some irritation and congestion in your nose or some drainage.  This is from the oxygen used during your procedure.  There is no need for concern and it should clear up in a day or so.  SYMPTOMS TO REPORT IMMEDIATELY:  Following upper endoscopy (EGD)  Vomiting of blood or coffee ground material  New chest pain or pain under the shoulder blades  Painful or persistently difficult swallowing  New shortness of breath  Fever of 100F or higher  Black, tarry-looking stools  For urgent or emergent issues, a gastroenterologist can be reached at any hour by calling [phone]. Do not use MyChart messaging for urgent concerns.    DIET:  We do  recommend a small meal at first, but then you may proceed to your regular diet.  Drink plenty of fluids but you should avoid alcoholic beverages for 24 hours.  ACTIVITY:  You should plan to take it easy for the rest of today and you should NOT DRIVE or use heavy machinery until tomorrow (because of the sedation medicines used during the test).    FOLLOW UP: Our staff will call the number listed on your records the next business day following your procedure.  We will call around 7:15- 8:00 am to check on you and address any questions or concerns that you may have regarding the information given to you following your procedure. If we do not reach you, we will leave a message.     If any biopsies were taken you will be contacted by phone or by letter within the next 1-3 weeks.  Please call us at [phone] if you have not heard about the biopsies in 3 weeks.    SIGNATURES/CONFIDENTIALITY: You and/or your care partner have signed paperwork which will be entered into your electronic medical record.  These signatures attest to the fact that that the information above on your After Visit Summary has been reviewed and is understood.  Full responsibility of the confidentiality of this discharge information lies with you and/or your care-partner.

## 2023-06-06 ENCOUNTER — Telehealth: Payer: Self-pay

## 2023-06-06 NOTE — Telephone Encounter (Signed)
  Follow up Call-     06/05/2023    9:27 AM 06/05/2023    9:04 AM  Call back number  Post procedure Call Back phone  # [phone]   Permission to leave phone message  Yes     Patient questions:  Do you have a fever, pain , or abdominal swelling? No. Pain Score  0 *  Have you tolerated food without any problems? Yes.    Have you been able to return to your normal activities? Yes.    Do you have any questions about your discharge instructions: Diet   No. Medications  No. Follow up visit  No.  Do you have questions or concerns about your Care? No.  Actions: * If pain score is 4 or above: No action needed, pain <4.

## 2023-06-10 ENCOUNTER — Ambulatory Visit: Payer: 59 | Admitting: Cardiovascular Disease

## 2023-06-10 ENCOUNTER — Other Ambulatory Visit: Payer: Self-pay

## 2023-06-10 MED ORDER — OMNIPOD 5 G7 PODS (GEN 5) MISC: 1.0000 | 3 refills | Status: DC

## 2023-06-11 NOTE — Progress Notes (Signed)
Fax received from [facility] on 05/19/23 for medical clearance/medication hold for EGD to be signed by V.W. Myra Gianotti, MD.  Provider signed on 05/26/23, msg reply via EPIC, scanned into pt's chart.

## 2023-06-13 ENCOUNTER — Encounter: Payer: Self-pay | Admitting: Internal Medicine

## 2023-06-13 ENCOUNTER — Ambulatory Visit (INDEPENDENT_AMBULATORY_CARE_PROVIDER_SITE_OTHER): Payer: 59 | Admitting: Internal Medicine

## 2023-06-13 VITALS — BP 126/72 | HR 70 | Ht 64.0 in | Wt 128.0 lb

## 2023-06-13 DIAGNOSIS — E785 Hyperlipidemia, unspecified: Secondary | ICD-10-CM | POA: Diagnosis not present

## 2023-06-13 DIAGNOSIS — E1142 Type 2 diabetes mellitus with diabetic polyneuropathy: Secondary | ICD-10-CM

## 2023-06-13 DIAGNOSIS — R809 Proteinuria, unspecified: Secondary | ICD-10-CM

## 2023-06-13 DIAGNOSIS — Z4681 Encounter for fitting and adjustment of insulin pump: Secondary | ICD-10-CM

## 2023-06-13 DIAGNOSIS — E1129 Type 2 diabetes mellitus with other diabetic kidney complication: Secondary | ICD-10-CM | POA: Diagnosis not present

## 2023-06-13 DIAGNOSIS — E1159 Type 2 diabetes mellitus with other circulatory complications: Secondary | ICD-10-CM | POA: Diagnosis not present

## 2023-06-13 DIAGNOSIS — Z794 Long term (current) use of insulin: Secondary | ICD-10-CM | POA: Diagnosis not present

## 2023-06-13 LAB — POCT GLYCOSYLATED HEMOGLOBIN (HGB A1C): Hemoglobin A1C: 6.9 % — AB (ref 4.0–5.6)

## 2023-06-13 NOTE — Patient Instructions (Signed)

## 2023-06-13 NOTE — Progress Notes (Signed)
 Name: [name]  Age/ Sex: 68 y.o., female   MRN/ DOB: [id number], 1956-02-07     PCP: Sheldon Netter, PA   Reason for Endocrinology Evaluation: Type 2 Diabetes Mellitus  Initial Endocrine Consultative Visit: 04/17/2020    PATIENT IDENTIFIER: [name] is a 68 y.o. female with a past medical history of T2DM, PAD ,CAD, HTN and Hx of pancreatitis  . The patient has followed with Endocrinology clinic since 04/17/2020 for consultative assistance with management of her diabetes.  DIABETIC HISTORY:  [name] was diagnosed with DM in 2003, She is intolerant to Metformin  due to diarrhea and Soliqua due to itching . Her hemoglobin A1c has ranged from 7.1%  in 20, peaking at 9.5% in 2015  Has chronic GI issues - Dr Avram   Islet Ab's and GAD-65 negative   Jardiance  started 07/2020, stopped 02/2023 due to recurrent yeast infections Omnipod started 08/2020   SUBJECTIVE:   During the last visit (11/29/2022) :  A1c 6.4%   Today (06/13/2023): [name] is here for diabetes follow up .  She checks her blood sugars multiple times a day through CGM. The patient has had hypoglycemic episodes since the last clinic visit. She is symptomatic   She follows with Dr. Avram for dyspepsia and IBS She is s/p left lower extremity mechanical thrombectomy, with hx  (fem-pop bypass) She had a follow-up with cardiology 05/2023 Had diarrhea yesterday  that she attributes to Ensure , has been having GI issues with low appetite  This patient with type 2 diabetes is treated with Omnipod (insulin  pump). During the visit the pump basal and bolus doses were reviewed including carb/insulin  rations and supplemental doses. The clinical list was updated. The glucose meter download was reviewed in detail to determine if the current pump settings are providing the best glycemic control without excessive hypoglycemia.   Pump and meter download:    Pump   Omnipod Settings   Insulin  type   Humalog      Basal rate       0000  1.30u/h    0400 1.45   0800 1.55   1200 1.60      I:C ratio       0000 1:5                  Sensitivity       0000  20      Goal       0000  120           Type & Model of Pump: Omnipod Insulin  Type: Currently using Humalog  .     PUMP STATISTICS: Average BG: 140 Average Daily Carbs (g):66.7 Average Total Daily Insulin :40.3 Average Daily Basal: 25.8 (64 %) Average Daily Bolus: 14.6(36 %)   CONTINUOUS GLUCOSE MONITORING RECORD INTERPRETATION    Dates of Recording: 1/25-06/13/2023  Sensor description:dexcom  Results statistics:   CGM use % of time 85.7  Average and SD 140/48  Time in range 79%  % Time Above 180 15  % Time above 250 3  % Time Below target 2   Glycemic patterns summary:BG's trend down overnight and fluctuate over night Hyperglycemic episodes postprandial   Hypoglycemic episodes occurred during the night   Overnight periods: mostly optimal with occasional hypoglycemia    HOME DIABETES REGIMEN:  Humalog  Jardiance  25 mg daily     Statin: yes ACE-I/ARB: yes      DIABETIC COMPLICATIONS: Microvascular complications:  Neuropathy  Denies: CKD, retinopathy Last [facility]  Exam: Completed 12/27/2021  Macrovascular complications:  PVD ( S/P femoral-popliteal bypass), Mild CAD  Denies: CAD, CVA   HISTORY:  Past Medical History:  Past Medical History:  Diagnosis Date   Allergic urticaria 07/10/2015   Allergy     Anemia    hx   Angioedema 07/10/2015   Anxiety    Arthritis    neck, left hand (09/14/2012)   Asthma    CAD (coronary artery disease)    MI 2003 and 2013   Cancer (HCC) 1985   ovarian, no treatment except surgery   Cataract    Complication of anesthesia    OCCASIONAL TROUBLE TURNING NECK TO RIGHT   Critical lower limb ischemia (HCC)    10/2014 s/p L SFA stenting   Diverticulosis of colon with hemorrhage 08/2011   GERD (gastroesophageal reflux disease)    GI bleed    H/O hiatal hernia     Hidradenitis    groin   History of blood transfusion 1985 AND 2013   Hyperlipidemia    Hypertension    Irritable bowel syndrome    Left-sided weakness    since stroke, left eye trouble seeing   Migraines    Neuropathy    Obesity    Osteoporosis    PAD (peripheral artery disease) (HCC)    a. critical limb ischemia s/p PTA/stenting of L SFA 10/2014. c. occ prior SFA stent by angio 01/2016, for possible PV bypass.   Pneumonia    baby   Recurrent upper respiratory infection (URI)    S/P arterial stent-mid Lt SFA 11/03/14 11/04/2014   Schatzki's ring    Sinus problem    Stomach ulcer 1972   non-bleeding   Stroke (HCC) 07-2007, 07-2008, 07-2009   total 3 strokes; mild left sided weakness and left eye jumps.   Type II diabetes mellitus (HCC)    Vitamin B 12 deficiency 07/15/2013   Past Surgical History:  Past Surgical History:  Procedure Laterality Date   ABDOMINAL AORTOGRAM W/LOWER EXTREMITY Left 10/01/2022   Procedure: ABDOMINAL AORTOGRAM W/LOWER EXTREMITY;  Surgeon: Serene Gaile ORN, MD;  [facility];  Service: Cardiovascular;  Laterality: Left;   ABDOMINAL HYSTERECTOMY  1985   ADENOIDECTOMY     ANTERIOR CERVICAL DECOMP/DISCECTOMY FUSION  2002   ANTERIOR CERVICAL DECOMP/DISCECTOMY FUSION N/A 04/08/2014   Procedure: Cervical Six-Seven ANTERIOR CERVICAL DECOMPRESSION/DISCECTOMY FUSION Plating and Bonegraft  2 LEVELS;  Surgeon: Rockey Peru, MD;  Location: [facility];  Service: Neurosurgery;  Laterality: N/A;  Cervical Six-Seven ANTERIOR CERVICAL DECOMPRESSION/DISCECTOMY FUSION Plating and Bonegraft  2 LEVELS   APPENDECTOMY  1985   AXILLARY HIDRADENITIS EXCISION  1990-2008   bilateral   BACK SURGERY     BREAST BIOPSY Right 2007   BREAST CYST EXCISION Right 2008   BREAST REDUCTION SURGERY     CARDIAC CATHETERIZATION  2004   mild disease   CATARACT EXTRACTION W/PHACO Left 05/16/2015   Procedure: CATARACT EXTRACTION PHACO AND INTRAOCULAR LENS PLACEMENT (IOC);   Surgeon: Oneil Platts, MD;  Location: [facility];  Service: Ophthalmology;  Laterality: Left;  CDE: 4.24   CATARACT EXTRACTION W/PHACO Right 05/30/2015   Procedure: CATARACT EXTRACTION RIGHT EYE PHACO AND INTRAOCULAR LENS PLACEMENT ;  Surgeon: Oneil Platts, MD;  Location: [facility];  Service: Ophthalmology;  Laterality: Right;  CDE:4.08   CHOLECYSTECTOMY  1990's   COLONOSCOPY  08/12/2011   Procedure: COLONOSCOPY;  Surgeon: Gwendlyn ONEIDA Buddy, MD,FACG;  Location: [facility] ENDOSCOPY;  Service: Endoscopy;  Laterality: N/A;   COLONOSCOPY  06/19/2006  COLONOSCOPY WITH PROPOFOL  N/A 07/02/2019   Procedure: COLONOSCOPY WITH PROPOFOL ;  Surgeon: Golda Claudis PENNER, MD;  [facility];  Service: Endoscopy;  Laterality: N/A;   cyst thigh Right    ESOPHAGOGASTRODUODENOSCOPY  08/12/2011   Procedure: ESOPHAGOGASTRODUODENOSCOPY (EGD);  Surgeon: Gwendlyn ONEIDA Buddy, MD,FACG;  Location: [facility] ENDOSCOPY;  Service: Endoscopy;  Laterality: N/A;   ESOPHAGOGASTRODUODENOSCOPY  06/04/2005   ESOPHAGOGASTRODUODENOSCOPY (EGD) WITH PROPOFOL  N/A 07/01/2019   Procedure: ESOPHAGOGASTRODUODENOSCOPY (EGD) WITH PROPOFOL ;  Surgeon: Golda Claudis PENNER, MD;  [facility];  Service: Endoscopy;  Laterality: N/A;   FEMORAL-POPLITEAL BYPASS GRAFT Left 06/19/2016   Procedure: Left Leg BYPASS GRAFT FEMORAL-POPLITEAL ARTERY;  Surgeon: Gaile LELON New, MD;  [facility]: [facility];  Service: Vascular;  Laterality: Left;   GIVENS CAPSULE STUDY  08/13/2011   Procedure: GIVENS CAPSULE STUDY;  Surgeon: Gwendlyn ONEIDA Buddy, MD,FACG;  Location: [facility] ENDOSCOPY;  Service: Endoscopy;  Laterality: N/A;   HAMMER TOE SURGERY Bilateral ~ 2000   HEMOSTASIS CLIP PLACEMENT  07/02/2019   Procedure: HEMOSTASIS CLIP PLACEMENT;  Surgeon: Golda Claudis PENNER, MD;  [facility];  Service: Endoscopy;;  hepatic flexure   HIATAL HERNIA REPAIR     HYDRADENITIS EXCISION  01/2011; 03/2012   'groin and abdomen; 03/2012 (09/14/2012)   HYDRADENITIS EXCISION  04/01/2012   Procedure: EXCISION  HYDRADENITIS GROIN;  Surgeon: Donnice KATHEE Lunger, MD;  Location: [facility] ORS;  Service: General;  Laterality: Bilateral;  Excision of Hydradenitis of Perineum   HYDRADENITIS EXCISION N/A 09/17/2013   Procedure: EXCISION PERINEAL HIDRADENITIS ;  Surgeon: Donnice KATHEE Lunger, MD;  Location: [facility];  Service: General;  Laterality: N/A;  also in the pubis area   LEFT HEART CATH AND CORONARY ANGIOGRAPHY N/A 12/26/2016   Procedure: LEFT HEART CATH AND CORONARY ANGIOGRAPHY;  Surgeon: Jordan, Peter M, MD;  Location: [facility] INVASIVE CV LAB;  Service: Cardiovascular;  Laterality: N/A;   MASS EXCISION Right 09/17/2013   Procedure: EXCISION MASS;  Surgeon: Donnice KATHEE Lunger, MD;  Location: [facility] ORS;  Service: General;  Laterality: Right;   NISSEN FUNDOPLICATION  1996   PERIPHERAL VASCULAR CATHETERIZATION N/A 11/03/2014   Procedure: Lower Extremity Angiography;  Surgeon: Dorn JINNY Lesches, MD;  [facility];  Service: Cardiovascular;  Laterality: N/A;   PERIPHERAL VASCULAR CATHETERIZATION N/A 01/29/2016   Procedure: Lower Extremity Angiography;  Surgeon: Dorn JINNY Lesches, MD;  Location: [facility] INVASIVE CV LAB;  Service: Cardiovascular;  Laterality: N/A;   PERIPHERAL VASCULAR INTERVENTION Left 10/01/2022   Procedure: PERIPHERAL VASCULAR INTERVENTION;  Surgeon: New Gaile LELON, MD;  [facility];  Service: Cardiovascular;  Laterality: Left;  fem-pop bypass   PERIPHERAL VASCULAR THROMBECTOMY Left 10/01/2022   Procedure: PERIPHERAL VASCULAR THROMBECTOMY;  Surgeon: New Gaile LELON, MD;  [facility];  Service: Cardiovascular;  Laterality: Left;  fem-pop bypass   POLYPECTOMY  07/02/2019   Procedure: POLYPECTOMY;  Surgeon: Golda Claudis PENNER, MD;  [facility];  Service: Endoscopy;;   POSTERIOR LUMBAR FUSION  2008 X 2   REDUCTION MAMMAPLASTY  1996?   TONSILLECTOMY AND ADENOIDECTOMY  1959 AND 2000   UVULOPALATOPHARYNGOPLASTY, TONSILLECTOMY AND SEPTOPLASTY  2000's   Social History:   reports that she quit smoking about 3 years ago. Her smoking use included cigarettes. She started smoking about 44 years ago. She has a 20.5 pack-year smoking history. She has never used smokeless tobacco. She reports that she does not drink alcohol and does not use drugs. Family History:  Family History  Problem Relation Age of Onset   Breast cancer Mother    Heart disease Mother    Hypertension Mother    Diabetes Father    Heart disease Father    Stroke Father    Heart disease Sister    Hypertension Sister    Hypertension Sister    Hyperlipidemia Sister    Diabetes Sister    Allergic rhinitis Sister    Emphysema Other        great uncle   Aneurysm Sister        brain   Colon cancer Paternal Uncle    Angioedema Neg Hx    Asthma Neg Hx    Eczema Neg Hx    Immunodeficiency Neg Hx    Urticaria Neg Hx      HOME MEDICATIONS: Allergies as of 06/13/2023       Reactions   Sulfa  Antibiotics Shortness Of Breath, Palpitations   Codeine Other (See Comments)   Recovering Addict does not like to take Narcotics   Fish Allergy  Hives, Swelling   Tongue swelling   Iodine  Swelling   Metformin  And Related Diarrhea   Soliqua [insulin  Glargine-lixisenatide] Itching, Other (See Comments)   tongue swelling   Shellfish Allergy  Swelling, Rash   Tongue swelling        Medication List        Accurate as of June 13, 2023  3:26 PM. If you have any questions, ask your nurse or doctor.          STOP taking these medications    sucralfate  1 g tablet Commonly known as: CARAFATE  Stopped by: Donell PARAS Ulla Mckiernan       TAKE these medications    acetaminophen  325 MG tablet Commonly known as: TYLENOL  Take 2 tablets (650 mg total) by mouth every 6 (six) hours as needed for mild pain (or Fever >/= 101).   albuterol  108 (90 Base) MCG/ACT inhaler Commonly known as: ProAir  HFA 1-2 inhalations every 4-6 hours as needed for cough or wheeze. What changed:  how much to take how to  take this when to take this reasons to take this additional instructions   ALPRAZolam  1 MG tablet Commonly known as: XANAX  Take 1 mg by mouth 2 (two) times daily as needed for anxiety.   aspirin  EC 81 MG tablet Take 81 mg by mouth daily.   Azelastine  HCl 0.15 % Soln Place 2 sprays into both nostrils 2 (two) times daily. What changed:  when to take this reasons to take this   bisacodyl  5 MG EC tablet Commonly known as: DULCOLAX Take 5 mg by mouth daily as needed for moderate constipation.   clopidogrel  75 MG tablet Commonly known as: Plavix  Take 1 tablet (75 mg total) by mouth daily.   Dexcom G6 Receiver Devi by Does not apply route.   Dexcom G7 Sensor Misc 1 Device by Does not apply route as directed.   doxycycline  100 MG capsule Commonly known as: VIBRAMYCIN  Take 1 capsule (100 mg total) by mouth 2 (two) times daily. What changed:  when to take this reasons to take this   EPINEPHrine  0.3 mg/0.3 mL Soaj injection Commonly known as: EPI-PEN Inject 0.3 mLs (0.3 mg total) into the muscle once.   famotidine  40 MG tablet Commonly known as: PEPCID  Take 40 mg by mouth daily.   Fluzone High-Dose 0.5 ML injection Generic drug: Influenza vac split quadrivalent PF   hydrocortisone  2.5 % rectal cream Commonly known as: ANUSOL -HC Place rectally daily as needed for  hemorrhoids.   insulin  lispro 100 UNIT/ML injection Commonly known as: HUMALOG  Max daily 60 units   losartan -hydrochlorothiazide  50-12.5 MG tablet Commonly known as: Hyzaar Take 1 tablet by mouth daily.   lubiprostone  8 MCG capsule Commonly known as: AMITIZA  Take 1 capsule (8 mcg total) by mouth 2 (two) times daily with a meal.   Magnesium  200 MG Tabs Take 1 tablet (200 mg total) by mouth daily. What changed: when to take this   montelukast  10 MG tablet Commonly known as: SINGULAIR  Take 10 mg by mouth at bedtime as needed (allergies).   mupirocin  ointment 2 % Commonly known as: BACTROBAN  Apply  1 application topically 2 (two) times daily. What changed:  when to take this reasons to take this   nitroGLYCERIN  0.4 MG SL tablet Commonly known as: NITROSTAT  PLACE 1 TAB UNDER TONGUE EVERY 5 MINS AS NEEDED FOR CHEST PAIN - MAX 3 DOSES THEN 911   nortriptyline  10 MG capsule Commonly known as: PAMELOR  Take 1 capsule (10 mg total) by mouth at bedtime.   Omnipod 5 G7 Intro (Gen 5) Kit 1 Device by Does not apply route every other day.   Omnipod 5 G7 Pods (Gen 5) Misc 1 Device by Does not apply route every other day.   pantoprazole  40 MG tablet Commonly known as: PROTONIX  TAKE 1 TABLET BY MOUTH EVERY DAY   polyethylene glycol 17 g packet Commonly known as: MIRALAX  / GLYCOLAX  Take 17 g by mouth daily as needed for moderate constipation or severe constipation.   potassium chloride  10 MEQ tablet Commonly known as: KLOR-CON  Take 10 mEq by mouth 2 (two) times daily.   rosuvastatin  40 MG tablet Commonly known as: CRESTOR  Take 1 tablet (40 mg total) by mouth daily.   SUMAtriptan  100 MG tablet Commonly known as: IMITREX  Take 100 mg by mouth every 2 (two) hours as needed for migraine.   zolpidem  5 MG tablet Commonly known as: AMBIEN  Take 5 mg by mouth at bedtime as needed.         OBJECTIVE:   Vital Signs: BP 126/72 (BP Location: Left Arm, Patient Position: Sitting, Cuff Size: Normal)   Pulse 70   Ht 5' 4 (1.626 m)   Wt 128 lb (58.1 kg)   SpO2 96%   BMI 21.97 kg/m   Wt Readings from Last 3 Encounters:  06/13/23 128 lb (58.1 kg)  06/05/23 129 lb (58.5 kg)  05/19/23 128 lb 6.4 oz (58.2 kg)     Exam: General: Pt appears well and is in NAD  Lungs: Clear with good BS bilat   Heart: RRR   Extremities: No pretibial edema.  Neuro: MS is good with appropriate affect, pt is alert and Ox3      DM foot exam: 06/13/2023   The skin of the feet is intact without sores or ulcerations. The pedal pulses are 1 + bilaterally  The sensation is intact to a screening 5.07, 10  gram monofilament bilaterally      DATA REVIEWED:   Latest Reference Range & Units 05/09/23 11:00  Sodium 135 - 145 mEq/L 141  Potassium 3.5 - 5.1 mEq/L 3.5  Chloride 96 - 112 mEq/L 104  CO2 19 - 32 mEq/L 26  Glucose 70 - 99 mg/dL 847 (H)  BUN 6 - 23 mg/dL 13  Creatinine 9.59 - 8.79 mg/dL 9.13  Calcium  8.4 - 10.5 mg/dL 89.9  GFR >39.99 mL/min 69.47  (H): Data is abnormally high   Glutamic Acid Decarb Ab <5 IU/mL <5  ISLET CELL ANTIBODY SCREEN Negative  ASSESSMENT / PLAN / RECOMMENDATIONS:   1) Type 2 Diabetes Mellitus, Optimally  controlled, with neuropathic and macrovascular complications and microalbuminuria  - Most recent A1c of 6.9%. Goal A1c < 7.0 %.    -A1c continues to be optimal - Will decrease basal rate at night to prevent hypoglycemia -Intolerant to Jardiance  due to recurrent yeast infections -Has Hx of pancreatitis in 2010, so DPP-4 inhibitors and GLp-1 agonists are  CONTRAINDICATED - GAD-65 and Islet cell Ab's negative    MEDICATIONS:  Humalog      Pump   Omnipod Settings   Insulin  type   Humalog     Basal rate       0000  1.25 u/h    0400 1.40   0800 1.55   1200 1.60      I:C ratio       0000 1:5                  Sensitivity       0000  20      Goal       0000  120        EDUCATION / INSTRUCTIONS: BG monitoring instructions: Patient is instructed to check her blood sugars 4 times a day, before meals and bedtime . Call [facility] if: BG persistently < 70  I reviewed the Rule of 15 for the treatment of hypoglycemia in detail with the patient. Literature supplied.   2) Diabetic complications:  Eye: Does not have known diabetic retinopathy.  Neuro/ Feet: Does  have known diabetic peripheral neuropathy .  Renal: Patient does not have known baseline CKD.    3) Dyslipidemia:  - LDL has been at goal  - No changes     Medication Continue rosuvastatin  40 mg daily    4) Microalbuminuria :   -  MA/Cr ratio  slightly elevated at 48 in February 2023, repeat 2024 shows normalization  - She is on Losartan - HCTZ    F/U 4 months  I spent 25 minutes preparing to see the patient by review of recent labs, imaging and procedures, obtaining and reviewing separately obtained history, communicating with the patient, ordering medications, tests or procedures, and documenting clinical information in the EHR including the differential Dx, treatment, and any further evaluation and other management    Signed electronically by: Stefano Redgie Butts, MD  [facility] End[facility]ology  [facility] [address] [place], [place] [address] Phone: [phone] FAX: [phone]   CC: Sheldon Netter, PA [address] [place] [address] Phone: [phone]  Fax: [phone]  Return to Endocrinology clinic as below: Future Appointments  Date Time Provider Department Center  10/13/2023  1:40 PM Ece Cumberland, Donell Redgie, MD [id number] None

## 2023-06-16 ENCOUNTER — Encounter: Payer: Self-pay | Admitting: Internal Medicine

## 2023-06-17 DIAGNOSIS — E1165 Type 2 diabetes mellitus with hyperglycemia: Secondary | ICD-10-CM | POA: Diagnosis not present

## 2023-06-17 DIAGNOSIS — Z794 Long term (current) use of insulin: Secondary | ICD-10-CM | POA: Diagnosis not present

## 2023-06-17 DIAGNOSIS — E1149 Type 2 diabetes mellitus with other diabetic neurological complication: Secondary | ICD-10-CM | POA: Diagnosis not present

## 2023-06-23 ENCOUNTER — Ambulatory Visit: Payer: Self-pay | Admitting: *Deleted

## 2023-06-23 NOTE — Patient Outreach (Addendum)
  Care Coordination   Follow Up Visit Note   08/01/2023 updated note for 06/23/23 Name: [name] MRN: [id number] DOB: October 15, 1955  [name] is a 68 y.o. year old female who sees [place], [name], [place] for primary care. I spoke with  [name] by phone today.  What matters to the patients health and wellness today?  Need OB GYN service- noticing some abnormal vaginal odor Agreed to calls with RN CM to check for available providers  Dr [name] August 11 2023. Dr [name] [address] [place] Monday 06/30/23 [name] 310  Dr [name]  at [address] [place]  is the preferred provider patient wants to be seen by  An appointment scheduled    Goals Addressed             This Visit's Progress    managing worsening GYN symptoms, be seen by a GYN (RN CM)   Not on track     Patient will continue to perform well at work and speak with leadership about her concerns- 06/23/23 resolved voiced improvements Patient will manage medical diagnoses as ordered _ 06/23/23 continue to manage at home  Patient will seek services/resources for depression from care coordination or resource via her job -06/23/23 Not discussed Interventions Today    Flowsheet Row Most Recent Value  Chronic Disease   Chronic disease during today's visit Other  [vaginal odor, assist with finding in network OBGYN]  General Interventions   General Interventions Discussed/Reviewed General Interventions Reviewed, Health Screening, Doctor Visits, Communication with  Doctor Visits Discussed/Reviewed Doctor Visits Reviewed, Specialist  Health Screening --  [gyn exam]  PCP/Specialist Visits Compliance with follow-up visit  Communication with PCP/Specialists  Education Interventions   Education Provided Provided Education  [importance of GYN exams no matter what age for abnormal symptoms Provided names of GYN providers Calls to offices with patient to obtain an  appointment]              SDOH assessments and interventions completed:  No     Care Coordination Interventions:  Yes, provided   Follow up plan: Follow up call scheduled for 07/11/23    Encounter Outcome:  Patient Visit Completed   Jennifer L. Noelle Penner, RN, BSN, CCM [place]  [facility], [facility] Health RN Care Manager Direct Dial: [phone]  Fax: [phone] Mailing Address: [address]  [place] [address] Website: [email or web address]

## 2023-06-27 ENCOUNTER — Ambulatory Visit: Payer: 59 | Admitting: Internal Medicine

## 2023-07-11 ENCOUNTER — Ambulatory Visit: Payer: Self-pay | Admitting: *Deleted

## 2023-07-11 NOTE — Patient Instructions (Signed)
 Visit Information  Thank you for taking time to visit with me today. Please don't hesitate to contact me if I can be of assistance to you.   Ewing Schlein is the new assigned RN CM [phone] until she gets her regular phone  Following are the goals we discussed today:   Goals Addressed             This Visit's Progress    managing worsening GYN symptoms, be seen by a GYN (RN CM)   On track    Pending schedule of Eula Fried RN CM re assigned RN CM for future outreaches  Patient will continue to perform well at work and speak with leadership about her concerns- 06/23/23 resolved voiced improvements Patient will manage medical diagnoses as ordered _ 06/23/23 continue to manage at home  Patient will seek services/resources for depression from care coordination or resource via her job -06/23/23 Not discussed Interventions Today    Flowsheet Row Most Recent Value  Chronic Disease   Chronic disease during today's visit Other, Diabetes, Hypertension (HTN)  [vaginal bacteria/odor]  General Interventions   General Interventions Discussed/Reviewed General Interventions Reviewed, Doctor Visits  Doctor Visits Discussed/Reviewed Doctor Visits Reviewed, Specialist  PCP/Specialist Visits Compliance with follow-up visit  Advanced Directive Interventions   Advanced Directives Discussed/Reviewed Advanced Directives Discussed  [not preferred]              Our next appointment is by telephone on date of scheduling by re assigned RN CM at ? time with Ewing Schlein  Please call the care guide team at [phone] if you need to cancel or reschedule your appointment.   If you are experiencing a Mental Health or Behavioral Health Crisis or need someone to talk to, please call the Suicide and Crisis Lifeline: 988 call the [place] National Suicide Prevention Lifeline: 6570213355 or TTY: (318)257-7029 TTY (818)155-5759) to talk to a trained counselor call 1-800-273-TALK (toll free, 24 hour  hotline) go to [facility] [address] [place] [phone]) call 911   Patient verbalizes understanding of instructions and care plan provided today and agrees to view in MyChart. Active MyChart status and patient understanding of how to access instructions and care plan via MyChart confirmed with patient.     The patient has been provided with contact information for the care management team and has been advised to call with any health related questions or concerns.   Lakresha Stifter L. Noelle Penner, RN, BSN, CCM [place]  [facility], [facility] Health RN Care Manager Direct Dial: [phone]  Fax: [phone]

## 2023-07-11 NOTE — Patient Outreach (Signed)
 Care Coordination   Follow Up Visit Note   08/01/2023 updated note for 07/11/23 Name: [name] MRN: [id number] DOB: 1955/09/24  [name] is a 68 y.o. year old female who sees [place], Janyth Pup[place], [place] for primary care. I spoke with  [name] by phone today.  What matters to the patients health and wellness today?  Warm transfer to new nurse care manager. Return call to patient after a voice message left for RN CM on 07/07/23   Confirmed patient had resolved her concern with new OB GYN and applications She voices appreciation for Dr Imogene Burn and will be following up again this month  Ms Domanski has progressed will since 08/2019. In 2021 she was having multiple ED/admission visits for abdominal pain and uncontrolled diabetes. Since collaboration with [name] she has decreased hospital visits, & decreased HgA1c to 6.9 (was 9.8)  Goals Addressed             This Visit's Progress    managing worsening GYN symptoms, be seen by a GYN (RN CM)   On track    Pending schedule of Eula Fried RN CM re assigned RN CM for future outreaches  Patient will continue to perform well at work and speak with leadership about her concerns- 06/23/23 resolved voiced improvements Patient will manage medical diagnoses as ordered _ 06/23/23 continue to manage at home  Patient will seek services/resources for depression from care coordination or resource via her job -06/23/23 Not discussed Interventions Today    Flowsheet Row Most Recent Value  Chronic Disease   Chronic disease during today's visit Other, Diabetes, Hypertension (HTN)  [vaginal bacteria/odor]  General Interventions   General Interventions Discussed/Reviewed General Interventions Reviewed, Doctor Visits  Doctor Visits Discussed/Reviewed Doctor Visits Reviewed, Specialist  PCP/Specialist Visits Compliance with follow-up visit  Advanced Directive Interventions   Advanced Directives Discussed/Reviewed Advanced Directives Discussed   [not preferred]              SDOH assessments and interventions completed:  No     Care Coordination Interventions:  Yes, provided   Follow up plan: Follow up call scheduled for pending schedule of Eula Fried, RN CM re assigned RN CM    Encounter Outcome:  Patient Visit Completed   Harl L. Noelle Penner, RN, BSN, CCM [place]  [facility] RN Care Manager Direct Dial: [phone]  Fax: [phone]

## 2023-07-15 ENCOUNTER — Encounter: Payer: Self-pay | Admitting: Internal Medicine

## 2023-07-16 ENCOUNTER — Encounter: Payer: Self-pay | Admitting: Internal Medicine

## 2023-07-17 ENCOUNTER — Emergency Department (HOSPITAL_COMMUNITY)

## 2023-07-17 ENCOUNTER — Encounter (HOSPITAL_COMMUNITY): Payer: Self-pay

## 2023-07-17 ENCOUNTER — Telehealth: Payer: Self-pay | Admitting: Internal Medicine

## 2023-07-17 ENCOUNTER — Inpatient Hospital Stay (HOSPITAL_COMMUNITY)
Admission: EM | Admit: 2023-07-17 | Discharge: 2023-07-23 | DRG: 378 | Disposition: A | Discharge status: Home / Self Care | Attending: Family Medicine | Admitting: Family Medicine

## 2023-07-17 DIAGNOSIS — M47812 Spondylosis without myelopathy or radiculopathy, cervical region: Secondary | ICD-10-CM | POA: Diagnosis present

## 2023-07-17 DIAGNOSIS — E876 Hypokalemia: Secondary | ICD-10-CM | POA: Diagnosis not present

## 2023-07-17 DIAGNOSIS — B379 Candidiasis, unspecified: Secondary | ICD-10-CM | POA: Diagnosis present

## 2023-07-17 DIAGNOSIS — K5730 Diverticulosis of large intestine without perforation or abscess without bleeding: Secondary | ICD-10-CM | POA: Diagnosis not present

## 2023-07-17 DIAGNOSIS — D500 Iron deficiency anemia secondary to blood loss (chronic): Secondary | ICD-10-CM | POA: Diagnosis not present

## 2023-07-17 DIAGNOSIS — Z885 Allergy status to narcotic agent status: Secondary | ICD-10-CM

## 2023-07-17 DIAGNOSIS — I5032 Chronic diastolic (congestive) heart failure: Secondary | ICD-10-CM | POA: Diagnosis not present

## 2023-07-17 DIAGNOSIS — E1149 Type 2 diabetes mellitus with other diabetic neurological complication: Secondary | ICD-10-CM | POA: Diagnosis not present

## 2023-07-17 DIAGNOSIS — K5521 Angiodysplasia of colon with hemorrhage: Secondary | ICD-10-CM | POA: Diagnosis not present

## 2023-07-17 DIAGNOSIS — I739 Peripheral vascular disease, unspecified: Secondary | ICD-10-CM | POA: Diagnosis present

## 2023-07-17 DIAGNOSIS — Z7902 Long term (current) use of antithrombotics/antiplatelets: Secondary | ICD-10-CM

## 2023-07-17 DIAGNOSIS — Z90710 Acquired absence of both cervix and uterus: Secondary | ICD-10-CM

## 2023-07-17 DIAGNOSIS — M810 Age-related osteoporosis without current pathological fracture: Secondary | ICD-10-CM | POA: Diagnosis present

## 2023-07-17 DIAGNOSIS — K921 Melena: Secondary | ICD-10-CM | POA: Diagnosis not present

## 2023-07-17 DIAGNOSIS — R079 Chest pain, unspecified: Secondary | ICD-10-CM | POA: Diagnosis present

## 2023-07-17 DIAGNOSIS — E1151 Type 2 diabetes mellitus with diabetic peripheral angiopathy without gangrene: Secondary | ICD-10-CM | POA: Diagnosis not present

## 2023-07-17 DIAGNOSIS — N390 Urinary tract infection, site not specified: Secondary | ICD-10-CM | POA: Diagnosis present

## 2023-07-17 DIAGNOSIS — E1159 Type 2 diabetes mellitus with other circulatory complications: Secondary | ICD-10-CM | POA: Diagnosis not present

## 2023-07-17 DIAGNOSIS — R1013 Epigastric pain: Secondary | ICD-10-CM | POA: Diagnosis present

## 2023-07-17 DIAGNOSIS — K226 Gastro-esophageal laceration-hemorrhage syndrome: Secondary | ICD-10-CM | POA: Diagnosis present

## 2023-07-17 DIAGNOSIS — Z1152 Encounter for screening for COVID-19: Secondary | ICD-10-CM

## 2023-07-17 DIAGNOSIS — K219 Gastro-esophageal reflux disease without esophagitis: Secondary | ICD-10-CM | POA: Diagnosis present

## 2023-07-17 DIAGNOSIS — E785 Hyperlipidemia, unspecified: Secondary | ICD-10-CM | POA: Diagnosis present

## 2023-07-17 DIAGNOSIS — D649 Anemia, unspecified: Secondary | ICD-10-CM | POA: Diagnosis not present

## 2023-07-17 DIAGNOSIS — Z79899 Other long term (current) drug therapy: Secondary | ICD-10-CM

## 2023-07-17 DIAGNOSIS — E860 Dehydration: Secondary | ICD-10-CM | POA: Diagnosis not present

## 2023-07-17 DIAGNOSIS — I2510 Atherosclerotic heart disease of native coronary artery without angina pectoris: Secondary | ICD-10-CM | POA: Diagnosis not present

## 2023-07-17 DIAGNOSIS — K31819 Angiodysplasia of stomach and duodenum without bleeding: Secondary | ICD-10-CM | POA: Diagnosis not present

## 2023-07-17 DIAGNOSIS — Z825 Family history of asthma and other chronic lower respiratory diseases: Secondary | ICD-10-CM

## 2023-07-17 DIAGNOSIS — I252 Old myocardial infarction: Secondary | ICD-10-CM

## 2023-07-17 DIAGNOSIS — Z8249 Family history of ischemic heart disease and other diseases of the circulatory system: Secondary | ICD-10-CM

## 2023-07-17 DIAGNOSIS — D6832 Hemorrhagic disorder due to extrinsic circulating anticoagulants: Secondary | ICD-10-CM | POA: Diagnosis not present

## 2023-07-17 DIAGNOSIS — E039 Hypothyroidism, unspecified: Secondary | ICD-10-CM | POA: Diagnosis not present

## 2023-07-17 DIAGNOSIS — Q399 Congenital malformation of esophagus, unspecified: Secondary | ICD-10-CM | POA: Diagnosis not present

## 2023-07-17 DIAGNOSIS — E1143 Type 2 diabetes mellitus with diabetic autonomic (poly)neuropathy: Secondary | ICD-10-CM | POA: Diagnosis present

## 2023-07-17 DIAGNOSIS — T45515A Adverse effect of anticoagulants, initial encounter: Secondary | ICD-10-CM | POA: Diagnosis present

## 2023-07-17 DIAGNOSIS — J4489 Other specified chronic obstructive pulmonary disease: Secondary | ICD-10-CM | POA: Diagnosis present

## 2023-07-17 DIAGNOSIS — K635 Polyp of colon: Secondary | ICD-10-CM | POA: Diagnosis not present

## 2023-07-17 DIAGNOSIS — R55 Syncope and collapse: Secondary | ICD-10-CM | POA: Diagnosis not present

## 2023-07-17 DIAGNOSIS — F17290 Nicotine dependence, other tobacco product, uncomplicated: Secondary | ICD-10-CM | POA: Diagnosis present

## 2023-07-17 DIAGNOSIS — I10 Essential (primary) hypertension: Secondary | ICD-10-CM | POA: Diagnosis present

## 2023-07-17 DIAGNOSIS — Z8719 Personal history of other diseases of the digestive system: Secondary | ICD-10-CM

## 2023-07-17 DIAGNOSIS — Z860100 Personal history of colon polyps, unspecified: Secondary | ICD-10-CM

## 2023-07-17 DIAGNOSIS — B9620 Unspecified Escherichia coli [E. coli] as the cause of diseases classified elsewhere: Secondary | ICD-10-CM | POA: Diagnosis present

## 2023-07-17 DIAGNOSIS — G43909 Migraine, unspecified, not intractable, without status migrainosus: Secondary | ICD-10-CM | POA: Diagnosis present

## 2023-07-17 DIAGNOSIS — K922 Gastrointestinal hemorrhage, unspecified: Secondary | ICD-10-CM | POA: Diagnosis not present

## 2023-07-17 DIAGNOSIS — E11649 Type 2 diabetes mellitus with hypoglycemia without coma: Secondary | ICD-10-CM | POA: Diagnosis not present

## 2023-07-17 DIAGNOSIS — Z888 Allergy status to other drugs, medicaments and biological substances status: Secondary | ICD-10-CM

## 2023-07-17 DIAGNOSIS — K5520 Angiodysplasia of colon without hemorrhage: Secondary | ICD-10-CM

## 2023-07-17 DIAGNOSIS — M19042 Primary osteoarthritis, left hand: Secondary | ICD-10-CM | POA: Diagnosis present

## 2023-07-17 DIAGNOSIS — Z794 Long term (current) use of insulin: Secondary | ICD-10-CM

## 2023-07-17 DIAGNOSIS — K449 Diaphragmatic hernia without obstruction or gangrene: Secondary | ICD-10-CM | POA: Diagnosis not present

## 2023-07-17 DIAGNOSIS — K2970 Gastritis, unspecified, without bleeding: Secondary | ICD-10-CM | POA: Diagnosis not present

## 2023-07-17 DIAGNOSIS — Z9889 Other specified postprocedural states: Secondary | ICD-10-CM | POA: Diagnosis not present

## 2023-07-17 DIAGNOSIS — N179 Acute kidney failure, unspecified: Secondary | ICD-10-CM | POA: Diagnosis not present

## 2023-07-17 DIAGNOSIS — Z7982 Long term (current) use of aspirin: Secondary | ICD-10-CM

## 2023-07-17 DIAGNOSIS — I110 Hypertensive heart disease with heart failure: Secondary | ICD-10-CM | POA: Diagnosis not present

## 2023-07-17 DIAGNOSIS — Z8543 Personal history of malignant neoplasm of ovary: Secondary | ICD-10-CM

## 2023-07-17 DIAGNOSIS — K3184 Gastroparesis: Secondary | ICD-10-CM | POA: Diagnosis present

## 2023-07-17 DIAGNOSIS — F419 Anxiety disorder, unspecified: Secondary | ICD-10-CM | POA: Diagnosis present

## 2023-07-17 DIAGNOSIS — D125 Benign neoplasm of sigmoid colon: Secondary | ICD-10-CM | POA: Diagnosis not present

## 2023-07-17 DIAGNOSIS — E1165 Type 2 diabetes mellitus with hyperglycemia: Secondary | ICD-10-CM | POA: Diagnosis not present

## 2023-07-17 DIAGNOSIS — Z1611 Resistance to penicillins: Secondary | ICD-10-CM | POA: Diagnosis not present

## 2023-07-17 DIAGNOSIS — Z800 Family history of malignant neoplasm of digestive organs: Secondary | ICD-10-CM

## 2023-07-17 DIAGNOSIS — R0602 Shortness of breath: Secondary | ICD-10-CM | POA: Diagnosis not present

## 2023-07-17 DIAGNOSIS — Z833 Family history of diabetes mellitus: Secondary | ICD-10-CM

## 2023-07-17 DIAGNOSIS — Z9641 Presence of insulin pump (external) (internal): Secondary | ICD-10-CM | POA: Diagnosis present

## 2023-07-17 DIAGNOSIS — Z803 Family history of malignant neoplasm of breast: Secondary | ICD-10-CM

## 2023-07-17 DIAGNOSIS — E119 Type 2 diabetes mellitus without complications: Secondary | ICD-10-CM

## 2023-07-17 DIAGNOSIS — Z882 Allergy status to sulfonamides status: Secondary | ICD-10-CM

## 2023-07-17 DIAGNOSIS — Z981 Arthrodesis status: Secondary | ICD-10-CM

## 2023-07-17 DIAGNOSIS — Z823 Family history of stroke: Secondary | ICD-10-CM

## 2023-07-17 DIAGNOSIS — Z8711 Personal history of peptic ulcer disease: Secondary | ICD-10-CM

## 2023-07-17 DIAGNOSIS — E669 Obesity, unspecified: Secondary | ICD-10-CM | POA: Diagnosis present

## 2023-07-17 DIAGNOSIS — Z8349 Family history of other endocrine, nutritional and metabolic diseases: Secondary | ICD-10-CM

## 2023-07-17 DIAGNOSIS — Z91013 Allergy to seafood: Secondary | ICD-10-CM

## 2023-07-17 LAB — PROTIME-INR
INR: 1 (ref 0.8–1.2)
Prothrombin Time: 13.8 s (ref 11.4–15.2)

## 2023-07-17 LAB — CBG MONITORING, ED: Glucose-Capillary: 111 mg/dL — ABNORMAL HIGH (ref 70–99)

## 2023-07-17 LAB — BASIC METABOLIC PANEL
Anion gap: 8 (ref 5–15)
BUN: 15 mg/dL (ref 8–23)
CO2: 25 mmol/L (ref 22–32)
Calcium: 9.5 mg/dL (ref 8.9–10.3)
Chloride: 101 mmol/L (ref 98–111)
Creatinine, Ser: 1.09 mg/dL — ABNORMAL HIGH (ref 0.44–1.00)
GFR, Estimated: 55 mL/min — ABNORMAL LOW (ref >60)
Glucose, Bld: 145 mg/dL — ABNORMAL HIGH (ref 70–99)
Potassium: 2.9 mmol/L — ABNORMAL LOW (ref 3.5–5.1)
Sodium: 134 mmol/L — ABNORMAL LOW (ref 135–145)

## 2023-07-17 LAB — CBC
HCT: 26.4 % — ABNORMAL LOW (ref 36.0–46.0)
Hemoglobin: 7.9 g/dL — ABNORMAL LOW (ref 12.0–15.0)
MCH: 27.1 pg (ref 26.0–34.0)
MCHC: 29.9 g/dL — ABNORMAL LOW (ref 30.0–36.0)
MCV: 90.4 fL (ref 80.0–100.0)
Platelets: 416 10*3/uL — ABNORMAL HIGH (ref 150–400)
RBC: 2.92 MIL/uL — ABNORMAL LOW (ref 3.87–5.11)
RDW: 16.1 % — ABNORMAL HIGH (ref 11.5–15.5)
WBC: 7.8 10*3/uL (ref 4.0–10.5)
nRBC: 1.5 % — ABNORMAL HIGH (ref 0.0–0.2)

## 2023-07-17 LAB — POC OCCULT BLOOD, ED: Fecal Occult Bld: POSITIVE — AB

## 2023-07-17 MED ORDER — PANTOPRAZOLE SODIUM 40 MG IV SOLR: 40.0000 mg | Freq: Two times a day (BID) | INTRAVENOUS | Status: DC

## 2023-07-17 MED ORDER — POTASSIUM CHLORIDE 10 MEQ/100ML IV SOLN
10.0000 meq | INTRAVENOUS | Status: AC
  Administered 2023-07-17 – 2023-07-18 (×2): 10 meq via INTRAVENOUS
  Filled 2023-07-17 (×3): qty 100

## 2023-07-17 MED ORDER — SODIUM CHLORIDE 0.9% IV SOLUTION: Freq: Once | INTRAVENOUS | Status: DC

## 2023-07-17 MED ORDER — PANTOPRAZOLE SODIUM 40 MG IV SOLR: 40.0000 mg | INTRAVENOUS | Status: DC

## 2023-07-17 NOTE — ED Provider Notes (Signed)
 [name] EMERGENCY DEPARTMENT AT [facility] Provider Note   CSN: [id number] Arrival date & time: 07/17/23  2128    History  Chief Complaint  Patient presents with   Shortness of Breath    [name] is a 68 y.o. female history diabetes, hypertension, PAD with critical limb ischemia s/p femoropopliteal bypass here for evaluation of shortness of breath and feeling unwell.  Shortness of breath, generalized fatigue since Saturday.  Had a syncopal episode on Tuesday when she went to get up from the toilet.  She did not hit her head.  States she feels very winded doing little activity.  She noted over the last few weeks she has had dark stool.  She states she told her GYN that she thought she had blood in her stool however was not tested. She also notes her blood sugars have been more elevated today than normal.  She is currently being treated for yeast infection.  She has also had a mild cough, has been having some palpitations.  Does get some chest tightness when she has her palpitations.  No back pain.  She gets intermittent epigastric abdominal pain however none currently.  She states she has had a GI bleed previously.  She is followed with Dr. Leone Payor and recently had an EGD in January which according to patient did not show anything.  Has needed blood transfusion previously.  She is on Plavix however no additional anticoagulation.  No pain or swelling to lower extremities.  No PND or orthopnea.  No history of PE or DVT.    GI- [facility]   HPI     Home Medications Prior to Admission medications   Medication Sig Start Date End Date Taking? Authorizing Provider  acetaminophen (TYLENOL) 325 MG tablet Take 2 tablets (650 mg total) by mouth every 6 (six) hours as needed for mild pain (or Fever >/= 101). 07/02/19   Emokpae, Courage, MD  albuterol (PROAIR HFA) 108 (90 Base) MCG/ACT inhaler 1-2 inhalations every 4-6 hours as needed for cough or wheeze. Patient taking differently:  Inhale 1-2 puffs into the lungs every 4 (four) hours as needed for shortness of breath. 07/10/15   Bobbitt, Heywood Iles, MD  ALPRAZolam Prudy Feeler) 1 MG tablet Take 1 mg by mouth 2 (two) times daily as needed for anxiety.    [provider]  aspirin EC 81 MG tablet Take 81 mg by mouth daily.    [provider]  Azelastine HCl 0.15 % SOLN Place 2 sprays into both nostrils 2 (two) times daily. Patient taking differently: Place 2 sprays into both nostrils daily as needed (allergies). 07/10/15   Bobbitt, Heywood Iles, MD  bisacodyl (DULCOLAX) 5 MG EC tablet Take 5 mg by mouth daily as needed for moderate constipation. 08/19/19   Iva Boop, MD  clopidogrel (PLAVIX) 75 MG tablet Take 1 tablet (75 mg total) by mouth daily. 10/01/22   Nada Libman, MD  Continuous Blood Gluc Receiver (DEXCOM G6 RECEIVER) DEVI by Does not apply route.    [provider]  Continuous Glucose Sensor (DEXCOM G7 SENSOR) MISC 1 Device by Does not apply route as directed. 11/29/22   [name], Konrad Dolores, MD  doxycycline (VIBRAMYCIN) 100 MG capsule Take 1 capsule (100 mg total) by mouth 2 (two) times daily. Patient taking differently: Take 100 mg by mouth as needed (Infection). 02/20/21   Mardella Layman, MD  EPINEPHrine 0.3 mg/0.3 mL IJ SOAJ injection Inject 0.3 mLs (0.3 mg total) into the muscle  once. 08/02/15   Bobbitt, Heywood Iles, MD  famotidine (PEPCID) 40 MG tablet Take 40 mg by mouth daily. 04/23/23   [provider]  FLUZONE HIGH-DOSE 0.5 ML injection  01/26/23   [provider]  hydrocortisone (ANUSOL-HC) 2.5 % rectal cream Place rectally daily as needed for hemorrhoids. 09/16/22   [provider]  Insulin Disposable Pump (OMNIPOD 5 G7 INTRO, GEN 5,) KIT 1 Device by Does not apply route every other day. 11/29/22   [name], Konrad Dolores, MD  Insulin Disposable Pump (OMNIPOD 5 G7 PODS, GEN 5,) MISC 1 Device by Does not apply route every other day. 06/10/23    [name], Konrad Dolores, MD  insulin lispro (HUMALOG) 100 UNIT/ML injection Max daily 60 units 03/13/23   [name], Konrad Dolores, MD  losartan-hydrochlorothiazide (HYZAAR) 50-12.5 MG tablet Take 1 tablet by mouth daily. 08/18/19   Jodelle Gross, NP  lubiprostone (AMITIZA) 8 MCG capsule Take 1 capsule (8 mcg total) by mouth 2 (two) times daily with a meal. 01/09/23   Iva Boop, MD  Magnesium 200 MG TABS Take 1 tablet (200 mg total) by mouth daily. Patient taking differently: Take 200 mg by mouth 2 (two) times daily. 09/17/22   Iva Boop, MD  montelukast (SINGULAIR) 10 MG tablet Take 10 mg by mouth at bedtime as needed (allergies). 02/22/22   [provider]  mupirocin ointment (BACTROBAN) 2 % Apply 1 application topically 2 (two) times daily. Patient taking differently: Apply 1 application  topically 2 (two) times daily as needed. 06/02/19   Wurst, [place], PA-C  nitroGLYCERIN (NITROSTAT) 0.4 MG SL tablet PLACE 1 TAB UNDER TONGUE EVERY 5 MINS AS NEEDED FOR CHEST PAIN - MAX 3 DOSES THEN 911 12/06/20   Runell Gess, MD  nortriptyline (PAMELOR) 10 MG capsule Take 1 capsule (10 mg total) by mouth at bedtime. 06/05/23   Iva Boop, MD  pantoprazole (PROTONIX) 40 MG tablet TAKE 1 TABLET BY MOUTH EVERY DAY Patient taking differently: Take 40 mg by mouth daily. 01/07/17   Runell Gess, MD  polyethylene glycol (MIRALAX / GLYCOLAX) 17 g packet Take 17 g by mouth daily as needed for moderate constipation or severe constipation. 08/19/19   Iva Boop, MD  potassium chloride (KLOR-CON) 10 MEQ tablet Take 10 mEq by mouth 2 (two) times daily. 10/01/19   [provider]  rosuvastatin (CRESTOR) 40 MG tablet Take 1 tablet (40 mg total) by mouth daily. 05/27/22   [name], Konrad Dolores, MD  SUMAtriptan (IMITREX) 100 MG tablet Take 100 mg by mouth every 2 (two) hours as needed for migraine.     [provider]  zolpidem (AMBIEN) 5 MG tablet Take 5 mg by  mouth at bedtime as needed. 04/28/23   [provider]      Allergies    Sulfa antibiotics, Codeine, Fish allergy, Iodine, Metformin and related, Soliqua [insulin glargine-lixisenatide], and Shellfish allergy    Review of Systems   Review of Systems  Constitutional:  Positive for activity change and appetite change.  HENT: Negative.    Respiratory:  Positive for cough and shortness of breath.   Cardiovascular:  Positive for palpitations.  Gastrointestinal:  Positive for blood in stool and nausea. Negative for diarrhea and vomiting.  Genitourinary: Negative.   Musculoskeletal: Negative.   Skin: Negative.   Neurological:  Positive for syncope, weakness and light-headedness.  All other systems reviewed and are negative.   Physical Exam Updated Vital Signs BP 110/66   Pulse  97   Temp 98.1 F (36.7 C) (Oral)   Resp 18   Ht 5\' 4"  (1.626 m)   Wt 58 kg   SpO2 100%   BMI 21.95 kg/m  Physical Exam Vitals and nursing note reviewed.  Constitutional:      General: She is not in acute distress.    Appearance: She is well-developed. She is not ill-appearing, toxic-appearing or diaphoretic.  HENT:     Head: Normocephalic and atraumatic.  Eyes:     Pupils: Pupils are equal, round, and reactive to light.  Cardiovascular:     Rate and Rhythm: Normal rate.     Pulses: Normal pulses.     Heart sounds: Normal heart sounds.  Pulmonary:     Effort: Pulmonary effort is normal. No respiratory distress.     Breath sounds: Normal breath sounds.     Comments: Clear bilaterally, speaks in full sentences without difficulty Chest:     Comments: Nontender chest wall Abdominal:     General: Bowel sounds are normal. There is no distension.     Palpations: Abdomen is soft.     Comments: Soft, nontender  Genitourinary:    Comments: Katie, paramedic present in room for exam.  Rectal exam with gross melena. Musculoskeletal:        General: Normal range of motion.     Cervical back:  Normal range of motion.     Right lower leg: No tenderness. No edema.     Left lower leg: No tenderness. No edema.     Comments: No bony tenderness, full range of motion, compartment soft  Skin:    General: Skin is warm and dry.     Capillary Refill: Capillary refill takes less than 2 seconds.  Neurological:     General: No focal deficit present.     Mental Status: She is alert.     Cranial Nerves: No cranial nerve deficit.     Motor: No weakness.  Psychiatric:        Mood and Affect: Mood normal.     ED Results / Procedures / Treatments   Labs (all labs ordered are listed, but only abnormal results are displayed) Labs Reviewed  CBC - Abnormal; Notable for the following components:      Result Value   RBC 2.92 (*)    Hemoglobin 7.9 (*)    HCT 26.4 (*)    MCHC 29.9 (*)    RDW 16.1 (*)    Platelets 416 (*)    nRBC 1.5 (*)    All other components within normal limits  BASIC METABOLIC PANEL - Abnormal; Notable for the following components:   Sodium 134 (*)    Potassium 2.9 (*)    Glucose, Bld 145 (*)    Creatinine, Ser 1.09 (*)    GFR, Estimated 55 (*)    All other components within normal limits  CBG MONITORING, ED - Abnormal; Notable for the following components:   Glucose-Capillary 111 (*)    All other components within normal limits  POC OCCULT BLOOD, ED - Abnormal; Notable for the following components:   Fecal Occult Bld POSITIVE (*)    All other components within normal limits  RESP PANEL BY RT-PCR (RSV, FLU A&B, COVID)  RVPGX2  HEPATIC FUNCTION PANEL  MAGNESIUM  LIPASE, BLOOD  PROTIME-INR  TYPE AND SCREEN  PREPARE RBC (CROSSMATCH)  TROPONIN I (HIGH SENSITIVITY)    EKG None  Radiology No results found.  Procedures .Critical Care  Performed by: Charyl Bigger  Davey Limas A, PA-C Authorized by: Linwood Dibbles, PA-C   Critical care provider statement:    Critical care time (minutes):  35   Critical care was necessary to treat or prevent imminent or  life-threatening deterioration of the following conditions:  Circulatory failure (symptomatic anemia)   Critical care was time spent personally by me on the following activities:  Development of treatment plan with patient or surrogate, discussions with consultants, evaluation of patient's response to treatment, examination of patient, ordering and review of laboratory studies, ordering and review of radiographic studies, ordering and performing treatments and interventions, pulse oximetry, re-evaluation of patient's condition and review of old charts     Medications Ordered in ED Medications  pantoprazole (PROTONIX) injection 40 mg (has no administration in time range)    Followed by  pantoprazole (PROTONIX) injection 40 mg (has no administration in time range)  0.9 %  sodium chloride infusion (Manually program via Guardrails IV Fluids) (has no administration in time range)  potassium chloride 10 mEq in 100 mL IVPB (has no administration in time range)    ED Course/ Medical Decision Making/ A&P   68 year old here for evaluation of shortness of breath and weakness.  Worsening over the last week or so.  Has also been having some palpitations.  Has noted she has had dark stool over the last few weeks.  History of prior GI bleed.  No abdominal pain.  On Plavix however no other anticoagulation.  Also has a mild cough.  Patient pale in room.  She is afebrile, nonseptic however does appear to not feel well.  Will plan on labs, imaging and reassess  Labs and imaging personally viewed and interpreted:  CBC without leukocytosis, hemoglobin 7.9 down from baseline of 12 BMP potassium 2.9, creatinine 1.09 Occult positive   I suspect patient with symptomatic anemia as cause of her shortness of breath.  Occult is positive she was started on Protonix.  Will plan on transfusion.  At this time I have low suspicion for acute ACS, PE, dissection, new onset CHF with pulmonary edema as cause of her shortness of  breath.  I have secured message Dr. Tiajuana Amass with [facility] as well as add him to the treatment team to make him aware of patient and admission.     Care transferred to [facility], PA-C who will FU on remaining labs and admission.                                Medical Decision Making Amount and/or Complexity of Data Reviewed External Data Reviewed: labs, radiology, ECG and notes. Labs: ordered. Decision-making details documented in ED Course. Radiology: ordered and independent interpretation performed. Decision-making details documented in ED Course. ECG/medicine tests: ordered and independent interpretation performed. Decision-making details documented in ED Course.  Risk OTC drugs. Prescription drug management. Parenteral controlled substances. Decision regarding hospitalization. Diagnosis or treatment significantly limited by social determinants of health.           Final Clinical Impression(s) / ED Diagnoses Final diagnoses:  Symptomatic anemia  Gastrointestinal hemorrhage with melena  Hypokalemia  Syncope, unspecified syncope type    Rx / DC Orders ED Discharge Orders     None         Carolyn Sylvia A, PA-C 07/17/23 2313    Lorre Nick, MD 07/21/23 1112

## 2023-07-17 NOTE — ED Triage Notes (Signed)
 Pt states that she has been having SOB for the past few days and feeling like she is going to pass out. Pt speaks in short sentences. Pt states that she has been having high CBG over the past few days as well.

## 2023-07-17 NOTE — ED Provider Notes (Signed)
 Patient given in sign out by [place], PA-C.  Please review their note for patient HPI, physical exam, workup.  At this time the plan is follow-up on labs and admit.  Labs and imaging came back reassuring.  Will admit to hospitalist.  Consults: Adela Glimpse, MD Hospitalist  I spoke to the hospitalist and patient was accepted for admission.  Patient stable to be admitted at this time.   [name] 07/18/23 Jennette Dubin, MD 07/21/23 [phone]

## 2023-07-18 ENCOUNTER — Inpatient Hospital Stay (HOSPITAL_COMMUNITY): Admitting: Anesthesiology

## 2023-07-18 ENCOUNTER — Other Ambulatory Visit: Payer: Self-pay

## 2023-07-18 ENCOUNTER — Inpatient Hospital Stay (HOSPITAL_COMMUNITY)

## 2023-07-18 ENCOUNTER — Encounter (HOSPITAL_COMMUNITY)
Admission: EM | Disposition: A | Discharge status: Home / Self Care | Payer: Self-pay | Source: Home / Self Care | Attending: Internal Medicine

## 2023-07-18 ENCOUNTER — Other Ambulatory Visit (HOSPITAL_COMMUNITY)

## 2023-07-18 DIAGNOSIS — K5521 Angiodysplasia of colon with hemorrhage: Secondary | ICD-10-CM | POA: Diagnosis present

## 2023-07-18 DIAGNOSIS — K921 Melena: Secondary | ICD-10-CM

## 2023-07-18 DIAGNOSIS — K5730 Diverticulosis of large intestine without perforation or abscess without bleeding: Secondary | ICD-10-CM | POA: Diagnosis not present

## 2023-07-18 DIAGNOSIS — K922 Gastrointestinal hemorrhage, unspecified: Secondary | ICD-10-CM | POA: Diagnosis not present

## 2023-07-18 DIAGNOSIS — R1013 Epigastric pain: Secondary | ICD-10-CM

## 2023-07-18 DIAGNOSIS — I2510 Atherosclerotic heart disease of native coronary artery without angina pectoris: Secondary | ICD-10-CM

## 2023-07-18 DIAGNOSIS — Z1152 Encounter for screening for COVID-19: Secondary | ICD-10-CM | POA: Diagnosis not present

## 2023-07-18 DIAGNOSIS — E860 Dehydration: Secondary | ICD-10-CM | POA: Diagnosis present

## 2023-07-18 DIAGNOSIS — D500 Iron deficiency anemia secondary to blood loss (chronic): Secondary | ICD-10-CM | POA: Diagnosis present

## 2023-07-18 DIAGNOSIS — E1159 Type 2 diabetes mellitus with other circulatory complications: Secondary | ICD-10-CM | POA: Diagnosis not present

## 2023-07-18 DIAGNOSIS — Z1611 Resistance to penicillins: Secondary | ICD-10-CM | POA: Diagnosis present

## 2023-07-18 DIAGNOSIS — N390 Urinary tract infection, site not specified: Secondary | ICD-10-CM | POA: Diagnosis present

## 2023-07-18 DIAGNOSIS — K31819 Angiodysplasia of stomach and duodenum without bleeding: Secondary | ICD-10-CM | POA: Diagnosis not present

## 2023-07-18 DIAGNOSIS — Q399 Congenital malformation of esophagus, unspecified: Secondary | ICD-10-CM

## 2023-07-18 DIAGNOSIS — K449 Diaphragmatic hernia without obstruction or gangrene: Secondary | ICD-10-CM

## 2023-07-18 DIAGNOSIS — K2970 Gastritis, unspecified, without bleeding: Secondary | ICD-10-CM | POA: Diagnosis not present

## 2023-07-18 DIAGNOSIS — B379 Candidiasis, unspecified: Secondary | ICD-10-CM | POA: Diagnosis present

## 2023-07-18 DIAGNOSIS — R079 Chest pain, unspecified: Secondary | ICD-10-CM | POA: Diagnosis not present

## 2023-07-18 DIAGNOSIS — I10 Essential (primary) hypertension: Secondary | ICD-10-CM | POA: Diagnosis not present

## 2023-07-18 DIAGNOSIS — I110 Hypertensive heart disease with heart failure: Secondary | ICD-10-CM | POA: Diagnosis present

## 2023-07-18 DIAGNOSIS — E876 Hypokalemia: Secondary | ICD-10-CM

## 2023-07-18 DIAGNOSIS — B9620 Unspecified Escherichia coli [E. coli] as the cause of diseases classified elsewhere: Secondary | ICD-10-CM | POA: Diagnosis present

## 2023-07-18 DIAGNOSIS — N179 Acute kidney failure, unspecified: Secondary | ICD-10-CM

## 2023-07-18 DIAGNOSIS — E785 Hyperlipidemia, unspecified: Secondary | ICD-10-CM

## 2023-07-18 DIAGNOSIS — D649 Anemia, unspecified: Principal | ICD-10-CM

## 2023-07-18 DIAGNOSIS — Z9889 Other specified postprocedural states: Secondary | ICD-10-CM | POA: Diagnosis not present

## 2023-07-18 DIAGNOSIS — I739 Peripheral vascular disease, unspecified: Secondary | ICD-10-CM

## 2023-07-18 DIAGNOSIS — E11649 Type 2 diabetes mellitus with hypoglycemia without coma: Secondary | ICD-10-CM | POA: Diagnosis not present

## 2023-07-18 DIAGNOSIS — K5520 Angiodysplasia of colon without hemorrhage: Secondary | ICD-10-CM | POA: Diagnosis not present

## 2023-07-18 DIAGNOSIS — K226 Gastro-esophageal laceration-hemorrhage syndrome: Secondary | ICD-10-CM | POA: Diagnosis not present

## 2023-07-18 DIAGNOSIS — E1143 Type 2 diabetes mellitus with diabetic autonomic (poly)neuropathy: Secondary | ICD-10-CM | POA: Diagnosis present

## 2023-07-18 DIAGNOSIS — K219 Gastro-esophageal reflux disease without esophagitis: Secondary | ICD-10-CM | POA: Diagnosis not present

## 2023-07-18 DIAGNOSIS — J4489 Other specified chronic obstructive pulmonary disease: Secondary | ICD-10-CM | POA: Diagnosis present

## 2023-07-18 DIAGNOSIS — Z794 Long term (current) use of insulin: Secondary | ICD-10-CM | POA: Diagnosis not present

## 2023-07-18 DIAGNOSIS — F17290 Nicotine dependence, other tobacco product, uncomplicated: Secondary | ICD-10-CM | POA: Diagnosis present

## 2023-07-18 DIAGNOSIS — E039 Hypothyroidism, unspecified: Secondary | ICD-10-CM | POA: Diagnosis present

## 2023-07-18 DIAGNOSIS — R55 Syncope and collapse: Secondary | ICD-10-CM

## 2023-07-18 DIAGNOSIS — I5032 Chronic diastolic (congestive) heart failure: Secondary | ICD-10-CM | POA: Diagnosis present

## 2023-07-18 DIAGNOSIS — R0602 Shortness of breath: Secondary | ICD-10-CM | POA: Diagnosis present

## 2023-07-18 DIAGNOSIS — E669 Obesity, unspecified: Secondary | ICD-10-CM | POA: Diagnosis present

## 2023-07-18 DIAGNOSIS — E1151 Type 2 diabetes mellitus with diabetic peripheral angiopathy without gangrene: Secondary | ICD-10-CM | POA: Diagnosis not present

## 2023-07-18 DIAGNOSIS — D125 Benign neoplasm of sigmoid colon: Secondary | ICD-10-CM | POA: Diagnosis not present

## 2023-07-18 DIAGNOSIS — K635 Polyp of colon: Secondary | ICD-10-CM | POA: Diagnosis not present

## 2023-07-18 DIAGNOSIS — D6832 Hemorrhagic disorder due to extrinsic circulating anticoagulants: Secondary | ICD-10-CM | POA: Diagnosis present

## 2023-07-18 DIAGNOSIS — I700 Atherosclerosis of aorta: Secondary | ICD-10-CM | POA: Diagnosis not present

## 2023-07-18 HISTORY — PX: ENTEROSCOPY: SHX5533

## 2023-07-18 LAB — CBC
HCT: 28.8 % — ABNORMAL LOW (ref 36.0–46.0)
HCT: 31.4 % — ABNORMAL LOW (ref 36.0–46.0)
HCT: 27.8 % — ABNORMAL LOW (ref 36.0–46.0)
HCT: 28.5 % — ABNORMAL LOW (ref 36.0–46.0)
Hemoglobin: 8.7 g/dL — ABNORMAL LOW (ref 12.0–15.0)
Hemoglobin: 9.4 g/dL — ABNORMAL LOW (ref 12.0–15.0)
Hemoglobin: 8.8 g/dL — ABNORMAL LOW (ref 12.0–15.0)
Hemoglobin: 8.9 g/dL — ABNORMAL LOW (ref 12.0–15.0)
MCH: 27.7 pg (ref 26.0–34.0)
MCH: 27.5 pg (ref 26.0–34.0)
MCH: 28.5 pg (ref 26.0–34.0)
MCH: 28.3 pg (ref 26.0–34.0)
MCHC: 30.2 g/dL (ref 30.0–36.0)
MCHC: 29.9 g/dL — ABNORMAL LOW (ref 30.0–36.0)
MCHC: 31.7 g/dL (ref 30.0–36.0)
MCHC: 31.2 g/dL (ref 30.0–36.0)
MCV: 91.7 fL (ref 80.0–100.0)
MCV: 91.8 fL (ref 80.0–100.0)
MCV: 90 fL (ref 80.0–100.0)
MCV: 90.5 fL (ref 80.0–100.0)
Platelets: 335 10*3/uL (ref 150–400)
Platelets: 162 10*3/uL (ref 150–400)
Platelets: 309 10*3/uL (ref 150–400)
Platelets: 326 10*3/uL (ref 150–400)
RBC: 3.14 MIL/uL — ABNORMAL LOW (ref 3.87–5.11)
RBC: 3.42 MIL/uL — ABNORMAL LOW (ref 3.87–5.11)
RBC: 3.09 MIL/uL — ABNORMAL LOW (ref 3.87–5.11)
RBC: 3.15 MIL/uL — ABNORMAL LOW (ref 3.87–5.11)
RDW: 15.6 % — ABNORMAL HIGH (ref 11.5–15.5)
RDW: 15.5 % (ref 11.5–15.5)
RDW: 15.6 % — ABNORMAL HIGH (ref 11.5–15.5)
RDW: 15.5 % (ref 11.5–15.5)
WBC: 10.1 10*3/uL (ref 4.0–10.5)
WBC: 9.5 10*3/uL (ref 4.0–10.5)
WBC: 19.7 10*3/uL — ABNORMAL HIGH (ref 4.0–10.5)
WBC: 12.3 10*3/uL — ABNORMAL HIGH (ref 4.0–10.5)
nRBC: 0.5 % — ABNORMAL HIGH (ref 0.0–0.2)
nRBC: 0.4 % — ABNORMAL HIGH (ref 0.0–0.2)
nRBC: 0.2 % (ref 0.0–0.2)
nRBC: 0.2 % (ref 0.0–0.2)

## 2023-07-18 LAB — URINALYSIS, W/ REFLEX TO CULTURE (INFECTION SUSPECTED)
Bilirubin Urine: NEGATIVE
Glucose, UA: NEGATIVE mg/dL
Ketones, ur: NEGATIVE mg/dL
Nitrite: POSITIVE — AB
Protein, ur: 30 mg/dL — AB
Specific Gravity, Urine: 1.009 (ref 1.005–1.030)
WBC, UA: >50 WBC/hpf (ref 0–5)
pH: 6 (ref 5.0–8.0)

## 2023-07-18 LAB — GLUCOSE, CAPILLARY
Glucose-Capillary: 73 mg/dL (ref 70–99)
Glucose-Capillary: 73 mg/dL (ref 70–99)
Glucose-Capillary: 85 mg/dL (ref 70–99)
Glucose-Capillary: 132 mg/dL — ABNORMAL HIGH (ref 70–99)
Glucose-Capillary: 212 mg/dL — ABNORMAL HIGH (ref 70–99)

## 2023-07-18 LAB — URINALYSIS, COMPLETE (UACMP) WITH MICROSCOPIC
Bilirubin Urine: NEGATIVE
Glucose, UA: NEGATIVE mg/dL
Ketones, ur: NEGATIVE mg/dL
Nitrite: POSITIVE — AB
Protein, ur: 100 mg/dL — AB
Specific Gravity, Urine: 1.01 (ref 1.005–1.030)
pH: 5 (ref 5.0–8.0)

## 2023-07-18 LAB — COMPREHENSIVE METABOLIC PANEL
ALT: 15 U/L (ref 0–44)
AST: 24 U/L (ref 15–41)
Albumin: 3.3 g/dL — ABNORMAL LOW (ref 3.5–5.0)
Alkaline Phosphatase: 57 U/L (ref 38–126)
Anion gap: 7 (ref 5–15)
BUN: 13 mg/dL (ref 8–23)
CO2: 23 mmol/L (ref 22–32)
Calcium: 8.7 mg/dL — ABNORMAL LOW (ref 8.9–10.3)
Chloride: 107 mmol/L (ref 98–111)
Creatinine, Ser: 0.97 mg/dL (ref 0.44–1.00)
GFR, Estimated: >60 mL/min (ref >60)
Glucose, Bld: 72 mg/dL (ref 70–99)
Potassium: 3.2 mmol/L — ABNORMAL LOW (ref 3.5–5.1)
Sodium: 137 mmol/L (ref 135–145)
Total Bilirubin: 1.6 mg/dL — ABNORMAL HIGH (ref 0.0–1.2)
Total Protein: 6.1 g/dL — ABNORMAL LOW (ref 6.5–8.1)

## 2023-07-18 LAB — HEMOGLOBIN A1C
Hgb A1c MFr Bld: 5.8 % — ABNORMAL HIGH (ref 4.8–5.6)
Mean Plasma Glucose: 119.76 mg/dL

## 2023-07-18 LAB — CBG MONITORING, ED: Glucose-Capillary: 90 mg/dL (ref 70–99)

## 2023-07-18 LAB — FOLATE: Folate: 7.6 ng/mL (ref >5.9)

## 2023-07-18 LAB — RESP PANEL BY RT-PCR (RSV, FLU A&B, COVID)  RVPGX2
Influenza A by PCR: NEGATIVE
Influenza B by PCR: NEGATIVE
Resp Syncytial Virus by PCR: NEGATIVE
SARS Coronavirus 2 by RT PCR: NEGATIVE

## 2023-07-18 LAB — RETICULOCYTES
Immature Retic Fract: 34 % — ABNORMAL HIGH (ref 2.3–15.9)
RBC.: 2.81 MIL/uL — ABNORMAL LOW (ref 3.87–5.11)
Retic Count, Absolute: 125.9 10*3/uL (ref 19.0–186.0)
Retic Ct Pct: 4.5 % — ABNORMAL HIGH (ref 0.4–3.1)

## 2023-07-18 LAB — HEPATIC FUNCTION PANEL
ALT: 13 U/L (ref 0–44)
AST: 18 U/L (ref 15–41)
Albumin: 3.5 g/dL (ref 3.5–5.0)
Alkaline Phosphatase: 54 U/L (ref 38–126)
Bilirubin, Direct: 0.3 mg/dL — ABNORMAL HIGH (ref 0.0–0.2)
Indirect Bilirubin: 0.9 mg/dL (ref 0.3–0.9)
Total Bilirubin: 1.2 mg/dL (ref 0.0–1.2)
Total Protein: 6.7 g/dL (ref 6.5–8.1)

## 2023-07-18 LAB — CREATININE, URINE, RANDOM: Creatinine, Urine: 86 mg/dL

## 2023-07-18 LAB — PHOSPHORUS
Phosphorus: 2.8 mg/dL (ref 2.5–4.6)
Phosphorus: 2.4 mg/dL — ABNORMAL LOW (ref 2.5–4.6)

## 2023-07-18 LAB — TROPONIN I (HIGH SENSITIVITY)
Troponin I (High Sensitivity): 8 ng/L (ref <18)
Troponin I (High Sensitivity): 7 ng/L (ref <18)

## 2023-07-18 LAB — FERRITIN: Ferritin: 17 ng/mL (ref 11–307)

## 2023-07-18 LAB — TSH: TSH: 0.777 u[IU]/mL (ref 0.350–4.500)

## 2023-07-18 LAB — LIPASE, BLOOD: Lipase: 27 U/L (ref 11–51)

## 2023-07-18 LAB — MAGNESIUM
Magnesium: 2.6 mg/dL — ABNORMAL HIGH (ref 1.7–2.4)
Magnesium: 2.2 mg/dL (ref 1.7–2.4)

## 2023-07-18 LAB — VITAMIN B12: Vitamin B-12: 168 pg/mL — ABNORMAL LOW (ref 180–914)

## 2023-07-18 LAB — IRON AND TIBC
Iron: 46 ug/dL (ref 28–170)
Saturation Ratios: 13 % (ref 10.4–31.8)
TIBC: 350 ug/dL (ref 250–450)
UIBC: 304 ug/dL

## 2023-07-18 LAB — SODIUM, URINE, RANDOM: Sodium, Ur: 17 mmol/L

## 2023-07-18 LAB — CK: Total CK: 31 U/L — ABNORMAL LOW (ref 38–234)

## 2023-07-18 LAB — OSMOLALITY: Osmolality: 288 mosm/kg (ref 275–295)

## 2023-07-18 LAB — HIV ANTIBODY (ROUTINE TESTING W REFLEX): HIV Screen 4th Generation wRfx: NONREACTIVE

## 2023-07-18 LAB — OSMOLALITY, URINE: Osmolality, Ur: 326 mosm/kg (ref 300–900)

## 2023-07-18 LAB — PREPARE RBC (CROSSMATCH)

## 2023-07-18 SURGERY — ENTEROSCOPY
Anesthesia: Monitor Anesthesia Care

## 2023-07-18 MED ORDER — HYDROCODONE-ACETAMINOPHEN 5-325 MG PO TABS
1.0000 | ORAL_TABLET | ORAL | Status: DC | PRN
  Administered 2023-07-19: 1 via ORAL
  Filled 2023-07-18: qty 1

## 2023-07-18 MED ORDER — BARIUM SULFATE 2 % PO SUSP
450.0000 mL | Freq: Once | ORAL | Status: AC
  Administered 2023-07-18: 450 mL via ORAL

## 2023-07-18 MED ORDER — SODIUM CHLORIDE 0.9 % IV SOLN
1.0000 g | INTRAVENOUS | Status: DC
  Administered 2023-07-18 – 2023-07-22 (×5): 1 g via INTRAVENOUS
  Filled 2023-07-18 (×5): qty 10

## 2023-07-18 MED ORDER — ROSUVASTATIN CALCIUM 20 MG PO TABS
40.0000 mg | ORAL_TABLET | Freq: Every day | ORAL | Status: DC
  Administered 2023-07-18 – 2023-07-23 (×6): 40 mg via ORAL
  Filled 2023-07-18 (×6): qty 2

## 2023-07-18 MED ORDER — ACETAMINOPHEN 325 MG PO TABS
650.0000 mg | ORAL_TABLET | Freq: Four times a day (QID) | ORAL | Status: DC | PRN
  Administered 2023-07-20: 650 mg via ORAL
  Filled 2023-07-18 (×2): qty 2

## 2023-07-18 MED ORDER — SODIUM CHLORIDE 0.9 % IV SOLN
INTRAVENOUS | Status: AC
Start: 2023-07-18 — End: 2023-07-18

## 2023-07-18 MED ORDER — PANTOPRAZOLE SODIUM 40 MG IV SOLR
40.0000 mg | Freq: Two times a day (BID) | INTRAVENOUS | Status: DC
  Administered 2023-07-18 – 2023-07-22 (×11): 40 mg via INTRAVENOUS
  Filled 2023-07-18 (×11): qty 10

## 2023-07-18 MED ORDER — GLUCAGON HCL RDNA (DIAGNOSTIC) 1 MG IJ SOLR
INTRAMUSCULAR | Status: AC
  Filled 2023-07-18: qty 1

## 2023-07-18 MED ORDER — PROPOFOL 1000 MG/100ML IV EMUL
INTRAVENOUS | Status: AC
  Filled 2023-07-18: qty 100

## 2023-07-18 MED ORDER — DIPHENHYDRAMINE HCL 50 MG PO CAPS
50.0000 mg | ORAL_CAPSULE | Freq: Once | ORAL | Status: AC
  Filled 2023-07-18: qty 1

## 2023-07-18 MED ORDER — INSULIN PUMP
SUBCUTANEOUS | Status: DC
  Filled 2023-07-18: qty 1

## 2023-07-18 MED ORDER — FENTANYL CITRATE PF 50 MCG/ML IJ SOSY
12.5000 ug | PREFILLED_SYRINGE | INTRAMUSCULAR | Status: DC | PRN
Start: 2023-07-18 — End: 2023-07-23
  Administered 2023-07-19 – 2023-07-20 (×2): 50 ug via INTRAVENOUS
  Filled 2023-07-18 (×2): qty 1

## 2023-07-18 MED ORDER — ACETAMINOPHEN 650 MG RE SUPP: 650.0000 mg | Freq: Four times a day (QID) | RECTAL | Status: DC | PRN

## 2023-07-18 MED ORDER — INSULIN ASPART 100 UNIT/ML IJ SOLN
0.0000 [IU] | Freq: Three times a day (TID) | INTRAMUSCULAR | Status: DC
  Administered 2023-07-19: 2 [IU] via SUBCUTANEOUS
  Administered 2023-07-19: 3 [IU] via SUBCUTANEOUS
  Administered 2023-07-20 – 2023-07-21 (×5): 2 [IU] via SUBCUTANEOUS
  Administered 2023-07-22 (×3): 1 [IU] via SUBCUTANEOUS
  Administered 2023-07-23: 3 [IU] via SUBCUTANEOUS

## 2023-07-18 MED ORDER — DIPHENHYDRAMINE HCL 50 MG/ML IJ SOLN
50.0000 mg | Freq: Once | INTRAMUSCULAR | Status: AC
  Administered 2023-07-18: 50 mg via INTRAVENOUS
  Filled 2023-07-18: qty 1

## 2023-07-18 MED ORDER — MONTELUKAST SODIUM 10 MG PO TABS: 10.0000 mg | ORAL_TABLET | Freq: Every evening | ORAL | Status: DC | PRN

## 2023-07-18 MED ORDER — SODIUM CHLORIDE 0.9% IV SOLUTION: Freq: Once | INTRAVENOUS | Status: AC

## 2023-07-18 MED ORDER — GLUCAGON HCL RDNA (DIAGNOSTIC) 1 MG IJ SOLR
INTRAMUSCULAR | Status: DC | PRN
  Administered 2023-07-18: .25 mg via INTRAVENOUS

## 2023-07-18 MED ORDER — PROPOFOL 10 MG/ML IV BOLUS
INTRAVENOUS | Status: DC | PRN
  Administered 2023-07-18: 30 mg via INTRAVENOUS
  Administered 2023-07-18: 20 mg via INTRAVENOUS

## 2023-07-18 MED ORDER — ALBUTEROL SULFATE HFA 108 (90 BASE) MCG/ACT IN AERS: 1.0000 | INHALATION_SPRAY | RESPIRATORY_TRACT | Status: DC | PRN

## 2023-07-18 MED ORDER — NORTRIPTYLINE HCL 10 MG PO CAPS
10.0000 mg | ORAL_CAPSULE | Freq: Every day | ORAL | Status: DC
  Administered 2023-07-18 – 2023-07-22 (×5): 10 mg via ORAL
  Filled 2023-07-18 (×5): qty 1

## 2023-07-18 MED ORDER — LIDOCAINE 2% (20 MG/ML) 5 ML SYRINGE
INTRAMUSCULAR | Status: DC | PRN
  Administered 2023-07-18: 100 mg via INTRAVENOUS

## 2023-07-18 MED ORDER — ALBUTEROL SULFATE (2.5 MG/3ML) 0.083% IN NEBU: 2.5000 mg | INHALATION_SOLUTION | RESPIRATORY_TRACT | Status: DC | PRN

## 2023-07-18 MED ORDER — ONDANSETRON HCL 4 MG PO TABS
4.0000 mg | ORAL_TABLET | Freq: Four times a day (QID) | ORAL | Status: DC | PRN
  Administered 2023-07-18: 4 mg via ORAL
  Filled 2023-07-18: qty 1

## 2023-07-18 MED ORDER — METHYLPREDNISOLONE SODIUM SUCC 40 MG IJ SOLR
40.0000 mg | Freq: Once | INTRAMUSCULAR | Status: AC
  Administered 2023-07-18: 40 mg via INTRAVENOUS
  Filled 2023-07-18: qty 1

## 2023-07-18 MED ORDER — ONDANSETRON HCL 4 MG/2ML IJ SOLN
4.0000 mg | Freq: Four times a day (QID) | INTRAMUSCULAR | Status: DC | PRN
  Administered 2023-07-21 – 2023-07-23 (×4): 4 mg via INTRAVENOUS
  Filled 2023-07-18 (×4): qty 2

## 2023-07-18 MED ORDER — PROPOFOL 500 MG/50ML IV EMUL
INTRAVENOUS | Status: DC | PRN
  Administered 2023-07-18: 130 ug/kg/min via INTRAVENOUS

## 2023-07-18 MED ORDER — IOHEXOL 350 MG/ML SOLN
100.0000 mL | Freq: Once | INTRAVENOUS | Status: AC | PRN
  Administered 2023-07-18: 100 mL via INTRAVENOUS

## 2023-07-18 MED ORDER — MORPHINE SULFATE (PF) 2 MG/ML IV SOLN
2.0000 mg | INTRAVENOUS | Status: DC | PRN
  Administered 2023-07-18 (×2): 2 mg via INTRAVENOUS
  Filled 2023-07-18 (×2): qty 1

## 2023-07-18 NOTE — Progress Notes (Signed)
 Attempted echo, but patient is in endoscopy at this time.

## 2023-07-18 NOTE — H&P (Signed)
 [name] GNF:[id number] DOB: Jun 12, 1955 DOA: 07/17/2023     PCP: Carin Hock, PA   Outpatient Specialists:  CARDS:   Dr. Nanetta Batty, MD   GI .  (  [facility]) Iva Boop, MD    Patient arrived to ER on 07/17/23 at 2128 Referred by Attending Lorre Nick, MD   Patient coming from:    home    Chief Complaint:   Chief Complaint  Patient presents with   Shortness of Breath    HPI: [name] is a 68 y.o. female with medical history significant of history of GERD history of GI bleed status post nisin fundoplication, PVD on Plavix, DM2 HTN history of stroke on Plavix, history of critical limb ischemia    Presented with   general fatigue and shortness of breath Patient has been feeling more short of breath and fatigued for the past 4 days 2 days ago she had a syncopal episode when she got up quickly from a toilet.  Denies head injury Reports dyspnea with mild exertion. Reports melena or dark stool for the past few weeks Epigastric abdominal pain Prior history of GI bleed followed by Dr. Concha Se last EGD was done in January  Patient is on Plavix history of PAD with critical leg ischemia status post Pham pop bypass No prior history of PE or DVT   Patient denies chest pain per se but has significant epigastric pain which she describes as heartburn  Has insulin pump in place reports that she had to have some adjustments to her insulin dosage due to episodes of hypoglycemia at night  Last dose of Plavix has been on Tuesday  Patient reports that she has been having some stools that smelled like blood for few weeks and that is what concerned her  She have had blood transfusions before tolerates well  Denies significant ETOH intake   Does not smoke   Lab Results  Component Value Date   SARSCOV2NAA NEGATIVE 07/28/2019   SARSCOV2NAA NEGATIVE 06/28/2019   SARSCOV2NAA NEGATIVE 06/20/2019        Regarding pertinent Chronic problems:    Hyperlipidemia -   on statins Crestor Lipid Panel     Component Value Date/Time   CHOL 126 06/15/2021 1622   TRIG 118 06/15/2021 1622   HDL 44 (L) 06/15/2021 1622   CHOLHDL 2.9 06/15/2021 1622   VLDL 35 07/06/2011 0500   LDLCALC 62 06/15/2021 1622   History of GERD on Protonix every day   HTN on Hyzaar   chronic CHF diastolic  - last echo  Recent Results (from the past 84696 hours)  ECHOCARDIOGRAM COMPLETE   Collection Time: 06/21/19 12:23 PM  Result Value   Weight 2,148.16   Height 64   BP 129/88   Narrative      ECHOCARDIOGRAM REPORT       Patient Name:   [name] Date of Exam: 06/21/2019 Medical Rec #:  [id number]       Height:       64.0 in Accession #:    [id number]      Weight:       134.3 lb Date of Birth:  [birth year]       BSA:          1.65 m Patient Age:    63 years        BP:           129/88 mmHg Patient Gender: F  HR:           94 bpm. [facility]  Procedure: 2D Echo  Indications:    Syncope   History:        Patient has prior history of Echocardiogram examinations, most                 recent 09/15/2012. CAD, COPD; Risk Factors:Hypertension, Diabetes                 and Dyslipidemia.   Sonographer:    Jeryl Columbia RDCS (AE) Referring Phys: [phone] Lamont Dowdy [facility]  IMPRESSIONS    1. Left ventricular ejection fraction, by estimation, is 65 to 70%. The left ventricle has hyperdynamic function. The left ventricle has no regional wall motion abnormalities. There is mild concentric left ventricular hypertrophy. Left ventricular  diastolic parameters are consistent with Grade I diastolic dysfunction (impaired relaxation).  2. Right ventricular systolic function is normal. The right ventricular size is normal.  3. The mitral valve is grossly normal. No evidence of mitral valve regurgitation.  4. The aortic valve is tricuspid. Aortic valve regurgitation is not visualized. No aortic stenosis is present.  5. The inferior vena cava is normal in  size with greater than 50% respiratory variability, suggesting right atrial pressure of 3 mmHg.  FIN              DM 2 -  Lab Results  Component Value Date   HGBA1C 6.9 (A) 06/13/2023   on insulin, PO meds only, diet controlled        Hx of CVA -  with/out residual deficits on  Plavix      Chronic anemia - baseline hg Hemoglobin & Hematocrit  Recent Labs    02/17/23 1505 05/09/23 1100 07/17/23 2144  HGB 12.2 12.5 7.9*   Iron/TIBC/Ferritin/ %Sat No results found for: "IRON", "TIBC", "FERRITIN", "IRONPCTSAT"     While in ER:   Found to be anemic with hemoglobin down to 7.9 and also hypokalemia with potassium down to 2.9     Lab Orders         Resp panel by RT-PCR (RSV, Flu A&B, Covid) Anterior Nasal Swab         CBC         Basic metabolic panel         Hepatic function panel         Magnesium         Lipase, blood         Protime-INR         CBG monitoring, ED         POC occult blood, ED Provider will collect       CXR - No active cardiopulmonary disease.    Following Medications were ordered in ER: Medications  pantoprazole (PROTONIX) injection 40 mg (40 mg Intravenous Not Given 07/17/23 2324)    Followed by  pantoprazole (PROTONIX) injection 40 mg (has no administration in time range)  0.9 %  sodium chloride infusion (Manually program via Guardrails IV Fluids) (0 mLs Intravenous Hold 07/17/23 2334)  potassium chloride 10 mEq in 100 mL IVPB (10 mEq Intravenous New Bag/Given 07/17/23 2332)    _______________________________________________________ ER Provider Called:     [facility]  Dr. Tomasa Rand They Recommend admit to medicine plan for procedure in a.m. Will see in AM      ED Triage Vitals  Encounter Vitals Group     BP 07/17/23 2140 110/66  Systolic BP Percentile --      Diastolic BP Percentile --      Pulse Rate 07/17/23 2140 97     Resp 07/17/23 2140 18     Temp 07/17/23 2140 98.1 F (36.7 C)     Temp Source 07/17/23 2140 Oral     SpO2  07/17/23 2140 100 %     Weight 07/17/23 2210 127 lb 13.9 oz (58 kg)     Height 07/17/23 2210 5\' 4"  (1.626 m)     Head Circumference --      Peak Flow --      Pain Score 07/17/23 2138 0     Pain Loc --      Pain Education --      Exclude from Growth Chart --   WUJW(11)@     _________________________________________ Significant initial  Findings: Abnormal Labs Reviewed  CBC - Abnormal; Notable for the following components:      Result Value   RBC 2.92 (*)    Hemoglobin 7.9 (*)    HCT 26.4 (*)    MCHC 29.9 (*)    RDW 16.1 (*)    Platelets 416 (*)    nRBC 1.5 (*)    All other components within normal limits  BASIC METABOLIC PANEL - Abnormal; Notable for the following components:   Sodium 134 (*)    Potassium 2.9 (*)    Glucose, Bld 145 (*)    Creatinine, Ser 1.09 (*)    GFR, Estimated 55 (*)    All other components within normal limits  HEPATIC FUNCTION PANEL - Abnormal; Notable for the following components:   Bilirubin, Direct 0.3 (*)    All other components within normal limits  MAGNESIUM - Abnormal; Notable for the following components:   Magnesium 2.6 (*)    All other components within normal limits  CBG MONITORING, ED - Abnormal; Notable for the following components:   Glucose-Capillary 111 (*)    All other components within normal limits  POC OCCULT BLOOD, ED - Abnormal; Notable for the following components:   Fecal Occult Bld POSITIVE (*)    All other components within normal limits      _________________________ Troponin  ordered Cardiac Panel (last 3 results) Recent Labs    07/17/23 2305  TROPONINIHS 8     ECG: Ordered Personally reviewed and interpreted by me showing: HR : 106 Rhythm: Sinus tachycardia Anterior infarct, old  Nonspecific changes Baseline wander in lead(s) I aVL QTC 411  BNP (last 3 results) No results for input(s): "BNP" in the last 8760 hours.   COVID-19 Labs  No results for input(s): "DDIMER", "FERRITIN", "LDH", "CRP" in the  last 72 hours.  Lab Results  Component Value Date   SARSCOV2NAA NEGATIVE 07/28/2019   SARSCOV2NAA NEGATIVE 06/28/2019   SARSCOV2NAA NEGATIVE 06/20/2019     The recent clinical data is shown below. Vitals:   07/17/23 2140 07/17/23 2210 07/17/23 2330 07/18/23 0000  BP: 110/66  108/69 121/71  Pulse: 97  89   Resp: 18  11 16   Temp: 98.1 F (36.7 C)     TempSrc: Oral     SpO2: 100%  100%   Weight:  58 kg    Height:  5\' 4"  (1.626 m)       WBC     Component Value Date/Time   WBC 7.8 07/17/2023 2144   LYMPHSABS 1.5 02/17/2023 1453   MONOABS 0.4 02/17/2023 1453   EOSABS 0.1 02/17/2023 1453   BASOSABS 0.1  02/17/2023 1453      UA  ordered     __________________________________________________________ Recent Labs  Lab 07/17/23 2144 07/17/23 2305  NA 134*  --   K 2.9*  --   CO2 25  --   GLUCOSE 145*  --   BUN 15  --   CREATININE 1.09*  --   CALCIUM 9.5  --   MG  --  2.6*    Cr    Up from baseline see below Lab Results  Component Value Date   CREATININE 1.09 (H) 07/17/2023   CREATININE 0.86 05/09/2023   CREATININE 0.70 02/17/2023    Recent Labs  Lab 07/17/23 2305  AST 18  ALT 13  ALKPHOS 54  BILITOT 1.2  PROT 6.7  ALBUMIN 3.5   Lab Results  Component Value Date   CALCIUM 9.5 07/17/2023   PHOS 2.2 (L) 06/30/2019    Plt: Lab Results  Component Value Date   PLT 416 (H) 07/17/2023       Recent Labs  Lab 07/17/23 2144  WBC 7.8  HGB 7.9*  HCT 26.4*  MCV 90.4  PLT 416*    HG/HCT  Down  from baseline see below    Component Value Date/Time   HGB 7.9 (L) 07/17/2023 2144   HCT 26.4 (L) 07/17/2023 2144   MCV 90.4 07/17/2023 2144    Recent Labs  Lab 07/17/23 2305  LIPASE 27     _____________________________________________ Hospitalist was called for admission for   Symptomatic anemia  Hypokalemia, upper gi bleed    Syncope, unspecified syncope type     The following Work up has been ordered so far:  Orders Placed This Encounter   Procedures   Critical Care   Resp panel by RT-PCR (RSV, Flu A&B, Covid) Anterior Nasal Swab   DG Chest 2 View   CBC   Basic metabolic panel   Hepatic function panel   Magnesium   Lipase, blood   Protime-INR   Diet NPO time specified   ED Cardiac monitoring   If O2 Sat <94% administer O2 at 2 liters/minute via nasal cannula   Orthostatic Vital signs   Place X2 Large Bore IV's   Initiate Carrier Fluid Protocol   Consult to hospitalist   CBG monitoring, ED   POC occult blood, ED Provider will collect   ED EKG   EKG 12-Lead   Type and screen [facility]   Prepare RBC     OTHER Significant initial  Findings:  labs showing:     DM  labs:  HbA1C: Recent Labs    11/29/22 1110 06/13/23 1500  HGBA1C 6.4* 6.9*       CBG (last 3)  Recent Labs    07/17/23 2138  GLUCAP 111*          Cultures:    Component Value Date/Time   SDES LEFT ANTECUBITAL BOTTLES DRAWN AEROBIC ONLY 07/28/2019 1430   SDES BLOOD LEFT HAND BOTTLES DRAWN AEROBIC ONLY 07/28/2019 1430   SPECREQUEST  07/28/2019 1430    Blood Culture results may not be optimal due to an inadequate volume of blood received in culture bottles   SPECREQUEST  07/28/2019 1430    Blood Culture results may not be optimal due to an inadequate volume of blood received in culture bottles   CULT  07/28/2019 1430    NO GROWTH 5 DAYS Performed at [facility] [address] [place], [place] [address]    CULT  07/28/2019 1430  NO GROWTH 5 DAYS Performed at [facility], [address], [place], [place] [address]    REPTSTATUS 08/02/2019 FINAL 07/28/2019 1430   REPTSTATUS 08/02/2019 FINAL 07/28/2019 1430     Radiological Exams on Admission: DG Chest 2 View Result Date: 07/17/2023 CLINICAL DATA:  Shortness of breath EXAM: CHEST - 2 VIEW COMPARISON:  Chest x-ray 07/28/2019 FINDINGS: The heart size and mediastinal contours are within normal limits. Both lungs are clear. The visualized skeletal structures  are unremarkable. IMPRESSION: No active cardiopulmonary disease. Electronically Signed   By: Darliss Cheney M.D.   On: 07/17/2023 23:56   _______________________________________________________________________________________________________ Latest  Blood pressure 121/71, pulse 89, temperature 98.1 F (36.7 C), temperature source Oral, resp. rate 16, height 5\' 4"  (1.626 m), weight 58 kg, SpO2 100%.   Vitals  labs and radiology finding personally reviewed  Review of Systems:    Pertinent positives include:  chest pain,/ epigastric pain  dyspnea on exertion Constitutional:  No weight loss, night sweats, Fevers, chills, fatigue, weight loss  HEENT:  No headaches, Difficulty swallowing,Tooth/dental problems,Sore throat,  No sneezing, itching, ear ache, nasal congestion, post nasal drip,  Cardio-vascular:  No Orthopnea, PND, anasarca, dizziness, palpitations.no Bilateral lower extremity swelling  GI:  No heartburn, indigestion, abdominal pain, nausea, vomiting, diarrhea, change in bowel habits, loss of appetite, melena, blood in stool, hematemesis Resp:  no shortness of breath at rest. No , No excess mucus, no productive cough, No non-productive cough, No coughing up of blood.No change in color of mucus.No wheezing. Skin:  no rash or lesions. No jaundice GU:  no dysuria, change in color of urine, no urgency or frequency. No straining to urinate.  No flank pain.  Musculoskeletal:  No joint pain or no joint swelling. No decreased range of motion. No back pain.  Psych:  No change in mood or affect. No depression or anxiety. No memory loss.  Neuro: no localizing neurological complaints, no tingling, no weakness, no double vision, no gait abnormality, no slurred speech, no confusion  All systems reviewed and apart from HOPI all are negative _______________________________________________________________________________________________ Past Medical History:   Past Medical History:   Diagnosis Date   Allergic urticaria 07/10/2015   Allergy    Anemia    hx   Angioedema 07/10/2015   Anxiety    Arthritis    "neck, left hand" (09/14/2012)   Asthma    CAD (coronary artery disease)    MI 2003 and 2013   Cancer (HCC) 1985   ovarian, no treatment except surgery   Cataract    Complication of anesthesia    OCCASIONAL TROUBLE TURNING NECK TO RIGHT   Critical lower limb ischemia (HCC)    10/2014 s/p L SFA stenting   Diverticulosis of colon with hemorrhage 08/2011   GERD (gastroesophageal reflux disease)    GI bleed    H/O hiatal hernia    Hidradenitis    groin   History of blood transfusion 1985 AND 2013   Hyperlipidemia    Hypertension    Irritable bowel syndrome    Left-sided weakness    since stroke, left eye trouble seeing   Migraines    Neuropathy    Obesity    Osteoporosis    PAD (peripheral artery disease) (HCC)    a. critical limb ischemia s/p PTA/stenting of L SFA 10/2014. c. occ prior SFA stent by angio 01/2016, for possible PV bypass.   Pneumonia    baby   Recurrent upper respiratory infection (URI)    S/P arterial  stent-mid Lt SFA 11/03/14 11/04/2014   Schatzki's ring    Sinus problem    Stomach ulcer 1972   non-bleeding   Stroke (HCC) 07-2007, 07-2008, 07-2009   total 3 strokes; mild left sided weakness and left eye "jumps".   Type II diabetes mellitus (HCC)    Vitamin B 12 deficiency 07/15/2013      Past Surgical History:  Procedure Laterality Date   ABDOMINAL AORTOGRAM W/LOWER EXTREMITY Left 10/01/2022   Procedure: ABDOMINAL AORTOGRAM W/LOWER EXTREMITY;  Surgeon: Nada Libman, MD;  Location: [facility] INVASIVE CV LAB;  Service: Cardiovascular;  Laterality: Left;   ABDOMINAL HYSTERECTOMY  1985   ADENOIDECTOMY     ANTERIOR CERVICAL DECOMP/DISCECTOMY FUSION  2002   ANTERIOR CERVICAL DECOMP/DISCECTOMY FUSION N/A 04/08/2014   Procedure: Cervical Six-Seven ANTERIOR CERVICAL DECOMPRESSION/DISCECTOMY FUSION Plating and Bonegraft  2 LEVELS;  Surgeon:  Coletta Memos, MD;  Location: [facility] NEURO ORS;  Service: Neurosurgery;  Laterality: N/A;  Cervical Six-Seven ANTERIOR CERVICAL DECOMPRESSION/DISCECTOMY FUSION Plating and Bonegraft  2 LEVELS   APPENDECTOMY  1985   AXILLARY HIDRADENITIS EXCISION  1990-2008   bilateral   BACK SURGERY     BREAST BIOPSY Right 2007   BREAST CYST EXCISION Right 2008   BREAST REDUCTION SURGERY     CARDIAC CATHETERIZATION  2004   mild disease   CATARACT EXTRACTION W/PHACO Left 05/16/2015   Procedure: CATARACT EXTRACTION PHACO AND INTRAOCULAR LENS PLACEMENT (IOC);  Surgeon: Jethro Bolus, MD;  Location: [facility] ORS;  Service: Ophthalmology;  Laterality: Left;  CDE: 4.24   CATARACT EXTRACTION W/PHACO Right 05/30/2015   Procedure: CATARACT EXTRACTION RIGHT EYE PHACO AND INTRAOCULAR LENS PLACEMENT ;  Surgeon: Jethro Bolus, MD;  Location: [facility] ORS;  Service: Ophthalmology;  Laterality: Right;  CDE:4.08   CHOLECYSTECTOMY  1990's   COLONOSCOPY  08/12/2011   Procedure: COLONOSCOPY;  Surgeon: Meryl Dare, MD,FACG;  Location: [facility] ENDOSCOPY;  Service: Endoscopy;  Laterality: N/A;   COLONOSCOPY  06/19/2006   COLONOSCOPY WITH PROPOFOL N/A 07/02/2019   Procedure: COLONOSCOPY WITH PROPOFOL;  Surgeon: Malissa Hippo, MD;  Location: [facility]O [facility];  Service: Endoscopy;  Laterality: N/A;   cyst thigh Right    ESOPHAGOGASTRODUODENOSCOPY  08/12/2011   Procedure: ESOPHAGOGASTRODUODENOSCOPY (EGD);  Surgeon: Meryl Dare, MD,FACG;  Location: [facility] ENDOSCOPY;  Service: Endoscopy;  Laterality: N/A;   ESOPHAGOGASTRODUODENOSCOPY  06/04/2005   ESOPHAGOGASTRODUODENOSCOPY (EGD) WITH PROPOFOL N/A 07/01/2019   Procedure: ESOPHAGOGASTRODUODENOSCOPY (EGD) WITH PROPOFOL;  Surgeon: Malissa Hippo, MD;  Location: [facility];  Service: Endoscopy;  Laterality: N/A;   FEMORAL-POPLITEAL BYPASS GRAFT Left 06/19/2016   Procedure: Left Leg BYPASS GRAFT FEMORAL-POPLITEAL ARTERY;  Surgeon: Nada Libman, MD;  Location: [facility] OR;  Service: Vascular;  Laterality: Left;    GIVENS CAPSULE STUDY  08/13/2011   Procedure: GIVENS CAPSULE STUDY;  Surgeon: Meryl Dare, MD,FACG;  Location: [facility] ENDOSCOPY;  Service: Endoscopy;  Laterality: N/A;   HAMMER TOE SURGERY Bilateral ~ 2000   HEMOSTASIS CLIP PLACEMENT  07/02/2019   Procedure: HEMOSTASIS CLIP PLACEMENT;  Surgeon: Malissa Hippo, MD;  Location: [facility];  Service: Endoscopy;;  hepatic flexure   HIATAL HERNIA REPAIR     HYDRADENITIS EXCISION  01/2011; 03/2012   'groin and abdomen; 03/2012" (09/14/2012)   HYDRADENITIS EXCISION  04/01/2012   Procedure: EXCISION HYDRADENITIS GROIN;  Surgeon: Valarie Merino, MD;  Location: [facility] ORS;  Service: General;  Laterality: Bilateral;  Excision of Hydradenitis of Perineum   HYDRADENITIS EXCISION N/A 09/17/2013   Procedure: EXCISION PERINEAL HIDRADENITIS ;  Surgeon: Valarie Merino, MD;  Location: [facility] ORS;  Service: General;  Laterality: N/A;  also in the pubis area   LEFT HEART CATH AND CORONARY ANGIOGRAPHY N/A 12/26/2016   Procedure: LEFT HEART CATH AND CORONARY ANGIOGRAPHY;  Surgeon: Swaziland, Peter M, MD;  Location: [facility] INVASIVE CV LAB;  Service: Cardiovascular;  Laterality: N/A;   MASS EXCISION Right 09/17/2013   Procedure: EXCISION MASS;  Surgeon: Valarie Merino, MD;  Location: [facility] ORS;  Service: General;  Laterality: Right;   NISSEN FUNDOPLICATION  1996   PERIPHERAL VASCULAR CATHETERIZATION N/A 11/03/2014   Procedure: Lower Extremity Angiography;  Surgeon: Runell Gess, MD;  Location: [facility];  Service: Cardiovascular;  Laterality: N/A;   PERIPHERAL VASCULAR CATHETERIZATION N/A 01/29/2016   Procedure: Lower Extremity Angiography;  Surgeon: Runell Gess, MD;  Location[facility] INVASIVE CV LAB;  Service: Cardiovascular;  Laterality: N/A;   PERIPHERAL VASCULAR INTERVENTION Left 10/01/2022   Procedure: PERIPHERAL VASCULAR INTERVENTION;  Surgeon: Nada Libman, MD;  Location: [facility];  Service: Cardiovascular;  Laterality: Left;  fem-pop bypass    PERIPHERAL VASCULAR THROMBECTOMY Left 10/01/2022   Procedure: PERIPHERAL VASCULAR THROMBECTOMY;  Surgeon: Nada Libman, MD;  Location: [facility];  Service: Cardiovascular;  Laterality: Left;  fem-pop bypass   POLYPECTOMY  07/02/2019   Procedure: POLYPECTOMY;  Surgeon: Malissa Hippo, MD;  Location: [facility];  Service: Endoscopy;;   POSTERIOR LUMBAR FUSION  2008 X 2   REDUCTION MAMMAPLASTY  1996?   TONSILLECTOMY AND ADENOIDECTOMY  1959 AND 2000   UVULOPALATOPHARYNGOPLASTY, TONSILLECTOMY AND SEPTOPLASTY  2000's    Social History:  Ambulatory  walker       reports that she quit smoking about 3 years ago. Her smoking use included cigarettes. She started smoking about 44 years ago. She has a 20.5 pack-year smoking history. She has never used smokeless tobacco. She reports that she does not drink alcohol and does not use drugs.    Family History:   Family History  Problem Relation Age of Onset   Breast cancer Mother    Heart disease Mother    Hypertension Mother    Diabetes Father    Heart disease Father    Stroke Father    Heart disease Sister    Hypertension Sister    Hypertension Sister    Hyperlipidemia Sister    Diabetes Sister    Allergic rhinitis Sister    Emphysema Other        great uncle   Aneurysm Sister        brain   Colon cancer Paternal Uncle    Angioedema Neg Hx    Asthma Neg Hx    Eczema Neg Hx    Immunodeficiency Neg Hx    Urticaria Neg Hx    ______________________________________________________________________________________________ Allergies: 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   Fish Allergy Hives and Swelling    Tongue swelling   Iodine Swelling   Metformin And Related Diarrhea   [name] [Insulin Glargine-Lixisenatide] Itching and Other (See Comments)    "tongue swelling"   Shellfish Allergy  Swelling and Rash    Tongue swelling     Prior to Admission medications   Medication Sig Start Date End Date Taking? Authorizing Provider  acetaminophen (TYLENOL) 325 MG tablet Take 2 tablets (650 mg total) by mouth every 6 (  six) hours as needed for mild pain (or Fever >/= 101). 07/02/19   Emokpae, Courage, MD  albuterol (PROAIR HFA) 108 (90 Base) MCG/ACT inhaler 1-2 inhalations every 4-6 hours as needed for cough or wheeze. Patient taking differently: Inhale 1-2 puffs into the lungs every 4 (four) hours as needed for shortness of breath. 07/10/15   Bobbitt, Heywood Iles, MD  ALPRAZolam Prudy Feeler) 1 MG tablet Take 1 mg by mouth 2 (two) times daily as needed for anxiety.    [provider]  aspirin EC 81 MG tablet Take 81 mg by mouth daily.    [provider]  Azelastine HCl 0.15 % SOLN Place 2 sprays into both nostrils 2 (two) times daily. Patient taking differently: Place 2 sprays into both nostrils daily as needed (allergies). 07/10/15   Bobbitt, Heywood Iles, MD  bisacodyl (DULCOLAX) 5 MG EC tablet Take 5 mg by mouth daily as needed for moderate constipation. 08/19/19   Iva Boop, MD  clopidogrel (PLAVIX) 75 MG tablet Take 1 tablet (75 mg total) by mouth daily. 10/01/22   Nada Libman, MD  Continuous Blood Gluc Receiver (DEXCOM G6 RECEIVER) DEVI by Does not apply route.    [provider]  Continuous Glucose Sensor (DEXCOM G7 SENSOR) MISC 1 Device by Does not apply route as directed. 11/29/22   [name], Konrad Dolores, MD  doxycycline (VIBRAMYCIN) 100 MG capsule Take 1 capsule (100 mg total) by mouth 2 (two) times daily. Patient taking differently: Take 100 mg by mouth as needed (Infection). 02/20/21   Mardella Layman, MD  EPINEPHrine 0.3 mg/0.3 mL IJ SOAJ injection Inject 0.3 mLs (0.3 mg total) into the muscle once. 08/02/15   Bobbitt, Heywood Iles, MD  famotidine (PEPCID) 40 MG tablet Take 40 mg by mouth daily. 04/23/23   [provider]  FLUZONE  HIGH-DOSE 0.5 ML injection  01/26/23   [provider]  hydrocortisone (ANUSOL-HC) 2.5 % rectal cream Place rectally daily as needed for hemorrhoids. 09/16/22   [provider]  Insulin Disposable Pump (OMNIPOD 5 G7 INTRO, GEN 5,) KIT 1 Device by Does not apply route every other day. 11/29/22   [name], Konrad Dolores, MD  Insulin Disposable Pump (OMNIPOD 5 G7 PODS, GEN 5,) MISC 1 Device by Does not apply route every other day. 06/10/23   [name], Konrad Dolores, MD  insulin lispro (HUMALOG) 100 UNIT/ML injection Max daily 60 units 03/13/23   [name], Konrad Dolores, MD  losartan-hydrochlorothiazide (HYZAAR) 50-12.5 MG tablet Take 1 tablet by mouth daily. 08/18/19   Jodelle Gross, NP  lubiprostone (AMITIZA) 8 MCG capsule Take 1 capsule (8 mcg total) by mouth 2 (two) times daily with a meal. 01/09/23   Iva Boop, MD  Magnesium 200 MG TABS Take 1 tablet (200 mg total) by mouth daily. Patient taking differently: Take 200 mg by mouth 2 (two) times daily. 09/17/22   Iva Boop, MD  montelukast (SINGULAIR) 10 MG tablet Take 10 mg by mouth at bedtime as needed (allergies). 02/22/22   [provider]  mupirocin ointment (BACTROBAN) 2 % Apply 1 application topically 2 (two) times daily. Patient taking differently: Apply 1 application  topically 2 (two) times daily as needed. 06/02/19   Wurst, [place], PA-C  nitroGLYCERIN (NITROSTAT) 0.4 MG SL tablet PLACE 1 TAB UNDER TONGUE EVERY 5 MINS AS NEEDED FOR CHEST PAIN - MAX 3 DOSES THEN 911 12/06/20   Runell Gess, MD  nortriptyline (PAMELOR) 10 MG capsule Take 1 capsule (10 mg total)  by mouth at bedtime. 06/05/23   Iva Boop, MD  pantoprazole (PROTONIX) 40 MG tablet TAKE 1 TABLET BY MOUTH EVERY DAY Patient taking differently: Take 40 mg by mouth daily. 01/07/17   Runell Gess, MD  polyethylene glycol (MIRALAX / GLYCOLAX) 17 g packet Take 17 g by mouth daily as needed for moderate constipation or severe  constipation. 08/19/19   Iva Boop, MD  potassium chloride (KLOR-CON) 10 MEQ tablet Take 10 mEq by mouth 2 (two) times daily. 10/01/19   [provider]  rosuvastatin (CRESTOR) 40 MG tablet Take 1 tablet (40 mg total) by mouth daily. 05/27/22   [name], Konrad Dolores, MD  SUMAtriptan (IMITREX) 100 MG tablet Take 100 mg by mouth every 2 (two) hours as needed for migraine.     [provider]  zolpidem (AMBIEN) 5 MG tablet Take 5 mg by mouth at bedtime as needed. 04/28/23   [provider]    ___________________________________________________________________________________________________ Physical Exam:    07/18/2023   12:00 AM 07/17/2023   11:30 PM 07/17/2023   10:10 PM  Vitals with BMI  Height   5\' 4"   Weight   127 lbs 14 oz  BMI   21.94  Systolic 121 108   Diastolic 71 69   Pulse  89      1. General:  in No  Acute distress   Chronically ill   -appearing 2. Psychological: Alert and   Oriented 3. Head/ENT:   Dry Mucous Membranes                          Head Non traumatic, neck supple                   Poor Dentition 4. SKIN: normal  Skin turgor,  Skin clean Dry and intact no rash    5. Heart: Regular rate and rhythm no  Murmur, no Rub or gallop 6. Lungs: no wheezes or crackles   7. Abdomen: Soft,  epigastric-tender, Non distended bowel sounds present 8. Lower extremities: no clubbing, cyanosis, no  edema 9. Neurologically Grossly intact, moving all 4 extremities equally  10. MSK: Normal range of motion    Chart has been reviewed  ______________________________________________________________________________________________  Assessment/Plan  68 y.o. female with medical history significant of history of GERD history of GI bleed status post nisin fundoplication, PVD on Plavix, DM2 HTN history of stroke on Plavix, history of critical limb ischemia   Admitted for   Symptomatic anemia  Hypokalemia, upper gi bleed    Syncope, unspecified  syncope type  Present on Admission:  Upper GI bleed  Chest pain at rest  Abdominal pain, epigastric  AKI (acute kidney injury) (HCC)  Dyslipidemia  Essential hypertension  Hypokalemia  PAD (peripheral artery disease) (HCC)  Syncope and collapse  Symptomatic anemia     Chest pain at rest Troponin unremarkable in the setting of symptomatic anemia we will transfuse a supportive management repeat echogram in a.m. given syncope  Abdominal pain, epigastric In the setting of likely GERD appreciate GI consult continue Protonix 40 mg IV twice daily  AKI (acute kidney injury) (HCC) In the setting of dehydration rehydrate follow fluid status  Diabetes mellitus (HCC) Insulin pump in place.  Diabetes coordinator consult insulin pump order set Follow CBG every 4 hours  Dyslipidemia Continue Crestor 40 mg a day  Essential hypertension Allow permissive hypertension  Hypokalemia - will replace electrolytes and repeat  check Mg, phos  and Ca level and replace as needed Monitor on telemetry   Lab Results  Component Value Date   K 2.9 (L) 07/17/2023     Lab Results  Component Value Date   CREATININE 1.09 (H) 07/17/2023   Lab Results  Component Value Date   MG 2.6 (H) 07/17/2023   Lab Results  Component Value Date   CALCIUM 9.5 07/17/2023   PHOS 2.2 (L) 06/30/2019     PAD (peripheral artery disease) (HCC) Hold Plavix in the setting of GI bleed Patient had stent placement in May 2024  Syncope and collapse Intermatic anemia.  Will transfuse and follow clinically. Obtain echogram for completion monitor on telemetry  Upper GI bleed  - Glasgow Blatchford score  Hg  <23F   melena   syncope *   >1 Justifies admission and aggressive management      Admit to progressive given that pt is on plavix    -  ER  Provider spoke to gastroenterology ( [facility]) they will see patient in a.m. appreciate their consult   - serial CBC.    - Monitor for any recurrence,  evidence of  hemodynamic instability or significant blood loss  - Transfuse 1 unit PRBCs given symptomatic anemia  - Establish at least 2 PIV and fluid resuscitate   - clear liquids for tonight keep nothing by mouth post midnight,   -  administer Protonix   twice a day        Symptomatic anemia Transfuse 1 unit PRBCs   Follow serial CBC    Other plan as per orders.  DVT prophylaxis:  SCD      Code Status:    Code Status: Prior FULL CODE as per patient   I had personally discussed CODE STATUS with patient   ACP   none   Family Communication:   Family not at  Bedside    Diet  Diet Orders (From admission, onward)     Start     Ordered   07/17/23 2240  Diet NPO time specified  Diet effective now        07/17/23 2239            Disposition Plan:       To home once workup is complete and patient is stable   Following barriers for discharge:                             Syncope workup                            Electrolytes corrected                               Anemia corrected                               Pain controlled with PO medications                                                           Will need consultants to evaluate patient prior to discharge       Consult Orders  (  From admission, onward)           Start     Ordered   07/18/23 0009  Consult to hospitalist  Once       Provider:  (Not yet assigned)  Question Answer Comment  Place call to: [facility]   Reason for Consult Admit      07/18/23 0008                               Diabetes care coordinator                  Consults called:     Treatment Team:  Jenel Lucks, MD  Admission status:  ED Disposition     ED Disposition  Admit   Condition  --   Comment  Hospital Area: [facility] [[id number]]  Level of Care: Progressive [102]  Admit to Progressive based on following criteria: GI, ENDOCRINE disease patients with GI bleeding, acute liver failure or  pancreatitis, stable with diabetic ketoacidosis or thyrotoxicosis (hypothyroid) state.  May admit patient to [facility] or [facility] if equivalent level of care is available:: No  Covid Evaluation: Asymptomatic - no recent exposure (last 10 days) testing not required  Diagnosis: Upper GI bleed [161096]  Admitting Physician: Therisa Doyne [[id number]]  Attending Physician: Therisa Doyne [[id number]]  Certification:: I certify this patient will need inpatient services for at least 2 midnights             inpatient     I Expect 2 midnight stay secondary to severity of patient's current illness need for inpatient interventions justified by the following:     Severe lab/radiological/exam abnormalities including:    Symptomatic anemia and extensive comorbidities including:  DM2    PAD  COPD/asthma  history of stroke with residual deficits    That are currently affecting medical management.   I expect  patient to be hospitalized for 2 midnights requiring inpatient medical care.  Patient is at high risk for adverse outcome (such as loss of life or disability) if not treated.  Indication for inpatient stay as follows:    severe pain requiring acute inpatient management,  inability to maintain oral hydration    Need for operative/procedural  intervention     Need for , IV fluids, blood transfusion    Level of care        progressive       Tenika Keeran 07/18/2023, 1:06 AM    [facility]     after 2 AM please page floor coverage PA If 7AM-7PM, please contact the day team taking care of the patient using [email or web address]

## 2023-07-18 NOTE — Consult Note (Addendum)
 Referring Provider: Ralph Leyden PA-C Primary Care Physician:  Carin Hock, PA Primary Gastroenterologist:  Dr. Stan Head  Reason for Consultation:  Anemia, dark stools   HPI: [name] is a 68 y.o. female is a 68 year old female with a past medical history of anxiety, asthma, hypertension, nonobstructive CAD per cardiac cath 2018 and remote MI, peripheral arterial disease s/p L SFA stent 10/2014 and s/p left fem-pop bypass with graft 06/2016 with occlusion s/p thrombectomy with restenting 10/01/2022, CVA x 3, DM type II, gastroparesis, ovarian cancer, diverticulosis, IBS, presumed diverticular bleed 08/2011, GERD s/p Nissen Fundoplication 1996 and UGI bleed/melena 06/2019. Past cholecystectomy.    She developed SOB for the past few days which progressively worsened with associated  syncopal episode on 07/15/2023 therefore she presented to the ED 07/17/2023 for further evaluation. She reported passing dark stools for the past few weeks. Labs in the ED showed a WBC count of 7.8. Hg 7.9 down from 12.5 two months ago. HCT 26.4. PLT 416. Na+ 134. K+ 2.9. BUN 15. Cr 1.09. T. Bili 1.2. Alk phos 54. AST 18. ALT 13. Lipase 27. Troponin 8 and 7. INR 1.0. Iron 46. Ferritin 17. SARS coronavirus 2 negative. Negative chest xray. EKG without acute ischemia. FOBT positive. Transfused 1 units of PRBCs. Started on PPI IV. A GI consult was requested for further evaluation for suspected GI bleed. She remains on Plavix secondary to PAD and ASA with prior history of CAD and CVA.   She first felt lightheaded and dizzy, like she might pass out on Saturday, 07/12/2023.  She continued to feel dizzy and developed nausea on Monday 3/10 with notable fatigue.  She was on the phone with her niece on Tuesday 3/11 and as she stood up she briefly passed out for few seconds and fell hitting her back without injuring her head.  She endorsed having shortness of breath and felt her heart was beating irregularly she contacted her  cardiologist who advised she go to the ED for further evaluation.  She also noted passing dark brown and black stools intermittently which has persisted since she underwent an EGD 06/05/2023.  She also endorsed having increased heartburn with on and off epigastric pain post EGD.  EGD 06/05/2023 which was done due to increase GERD symptoms, upper abdominal pain and black stools which showed a 2 cm hiatal hernia and a loose Nissan fundoplication wrap otherwise was unremarkable. She takes Pantoprazole 40 mg bid and Famotidine 40mg  bid. Last BM was on 3/8, was dark, not black.  She sometimes sees a small amount of pinkish blood on the toilet tissue after wiping without significant bright red blood per the rectum. Her most recent colonoscopy 03/02/2020 identified 3 small tubular adenomatous polyps removed from the colon.  GI PROCEDURES:  EGD 06/05/2023:  - Z-line irregular, 35 cm from the incisors.  - 2 cm hiatal hernia. Two areas of focal erythema as described on 2021 exam also.  - A Nissen fundoplication was found. The wrap appears loose.  - The examination was otherwise normal.  - No specimens collected.   EGD 07/01/2019 as an inpatient due to melena by Dr. Karilyn Cota:  - Normal esophagus.  - Z-line irregular, 35 cm from the incisors.  - 2 cm hiatal hernia with focal erythema to mucosa below GEJ.  - A Nissen fundoplication was found. The wrap appears loose.  - Normal duodenal bulb, second portion of the duodenum and area of the papilla.  - No specimens collected.  Colonoscopy 07/02/2019:  - One 5 mm polyp at the hepatic flexure, removed with a cold snare. Resected and  retrieved.  - Diverticulosis in the sigmoid colon, in the descending colon and in the transverse colon.    Colonoscopy 03/02/2020:  - Three 2 to 4 mm polyps in the proximal transverse colon and at the hepatic flexure, removed  with a cold snare. Resected and retrieved.  - Mild pancolonic diverticulosis.  - Non-bleeding internal  hemorrhoids.  - The examined portion of the ileum was normal.  - The examination was otherwise normal on direct and retroflexion views. No masses.  - 5 year colonoscopy recall  - TUBULAR ADENOMA(S)  - NEGATIVE FOR HIGH-GRADE DYSPLASIA OR MALIGNANCY   Small bowel capsule endoscopy 08/13/2011: Few scattered erosions otherwise normal study  EGD 08/12/2011 done as an inpatient secondary to GIB: Schatzki's ring Prior fundoplication  Colonoscopy 08/12/2011 done as an inpatient secondary to GIB: Diverticulosis in the transverse colon, descending and sigmoid colon     Past Medical History:  Diagnosis Date   Allergic urticaria 07/10/2015   Allergy    Anemia    hx   Angioedema 07/10/2015   Anxiety    Arthritis    "neck, left hand" (09/14/2012)   Asthma    CAD (coronary artery disease)    MI 2003 and 2013   Cancer (HCC) 1985   ovarian, no treatment except surgery   Cataract    Complication of anesthesia    OCCASIONAL TROUBLE TURNING NECK TO RIGHT   Critical lower limb ischemia (HCC)    10/2014 s/p L SFA stenting   Diverticulosis of colon with hemorrhage 08/2011   GERD (gastroesophageal reflux disease)    GI bleed    H/O hiatal hernia    Hidradenitis    groin   History of blood transfusion 1985 AND 2013   Hyperlipidemia    Hypertension    Irritable bowel syndrome    Left-sided weakness    since stroke, left eye trouble seeing   Migraines    Neuropathy    Obesity    Osteoporosis    PAD (peripheral artery disease) (HCC)    a. critical limb ischemia s/p PTA/stenting of L SFA 10/2014. c. occ prior SFA stent by angio 01/2016, for possible PV bypass.   Pneumonia    baby   Recurrent upper respiratory infection (URI)    S/P arterial stent-mid Lt SFA 11/03/14 11/04/2014   Schatzki's ring    Sinus problem    Stomach ulcer 1972   non-bleeding   Stroke (HCC) 07-2007, 07-2008, 07-2009   total 3 strokes; mild left sided weakness and left eye "jumps".   Type II diabetes mellitus (HCC)     Vitamin B 12 deficiency 07/15/2013    Past Surgical History:  Procedure Laterality Date   ABDOMINAL AORTOGRAM W/LOWER EXTREMITY Left 10/01/2022   Procedure: ABDOMINAL AORTOGRAM W/LOWER EXTREMITY;  Surgeon: Nada Libman, MD;  Location: [facility];  Service: Cardiovascular;  Laterality: Left;   ABDOMINAL HYSTERECTOMY  1985   ADENOIDECTOMY     ANTERIOR CERVICAL DECOMP/DISCECTOMY FUSION  2002   ANTERIOR CERVICAL DECOMP/DISCECTOMY FUSION N/A 04/08/2014   Procedure: Cervical Six-Seven ANTERIOR CERVICAL DECOMPRESSION/DISCECTOMY FUSION Plating and Bonegraft  2 LEVELS;  Surgeon: Coletta Memos, MD;  Location: [facility]URO [facility];  Service: Neurosurgery;  Laterality: N/A;  Cervical Six-Seven ANTERIOR CERVICAL DECOMPRESSION/DISCECTOMY FUSION Plating and Bonegraft  2 LEVELS   APPENDECTOMY  1985   AXILLARY HIDRADENITIS EXCISION  1990-2008   bilateral  BACK SURGERY     BREAST BIOPSY Right 2007   BREAST CYST EXCISION Right 2008   BREAST REDUCTION SURGERY     CARDIAC CATHETERIZATION  2004   mild disease   CATARACT EXTRACTION W/PHACO Left 05/16/2015   Procedure: CATARACT EXTRACTION PHACO AND INTRAOCULAR LENS PLACEMENT (IOC);  Surgeon: Jethro Bolus, MD;  Location: [facility];  Service: Ophthalmology;  Laterality: Left;  CDE: 4.24   CATARACT EXTRACTION W/PHACO Right 05/30/2015   Procedure: CATARACT EXTRACTION RIGHT EYE PHACO AND INTRAOCULAR LENS PLACEMENT ;  Surgeon: Jethro Bolus, MD;  Location: [facility];  Service: Ophthalmology;  Laterality: Right;  CDE:4.08   CHOLECYSTECTOMY  1990's   COLONOSCOPY  08/12/2011   Procedure: COLONOSCOPY;  Surgeon: Meryl Dare, MD,FACG;  Location: [facility] ENDOSCOPY;  Service: Endoscopy;  Laterality: N/A;   COLONOSCOPY  06/19/2006   COLONOSCOPY WITH PROPOFOL N/A 07/02/2019   Procedure: COLONOSCOPY WITH PROPOFOL;  Surgeon: Malissa Hippo, MD;  Location: [facility];  Service: Endoscopy;  Laterality: N/A;   cyst thigh Right    ESOPHAGOGASTRODUODENOSCOPY  08/12/2011   Procedure:  ESOPHAGOGASTRODUODENOSCOPY (EGD);  Surgeon: Meryl Dare, MD,FACG;  Location: [facility] ENDOSCOPY;  Service: Endoscopy;  Laterality: N/A;   ESOPHAGOGASTRODUODENOSCOPY  06/04/2005   ESOPHAGOGASTRODUODENOSCOPY (EGD) WITH PROPOFOL N/A 07/01/2019   Procedure: ESOPHAGOGASTRODUODENOSCOPY (EGD) WITH PROPOFOL;  Surgeon: Malissa Hippo, MD;  [facility]: [facility];  Service: Endoscopy;  Laterality: N/A;   FEMORAL-POPLITEAL BYPASS GRAFT Left 06/19/2016   Procedure: Left Leg BYPASS GRAFT FEMORAL-POPLITEAL ARTERY;  Surgeon: Nada Libman, MD;  Location: [facility] OR;  Service: Vascular;  Laterality: Left;   GIVENS CAPSULE STUDY  08/13/2011   Procedure: GIVENS CAPSULE STUDY;  Surgeon: Meryl Dare, MD,FACG;  Location: [facility] ENDOSCOPY;  Service: Endoscopy;  Laterality: N/A;   HAMMER TOE SURGERY Bilateral ~ 2000   HEMOSTASIS CLIP PLACEMENT  07/02/2019   Procedure: HEMOSTASIS CLIP PLACEMENT;  Surgeon: Malissa Hippo, MD;  Location: [facility];  Service: Endoscopy;;  hepatic flexure   HIATAL HERNIA REPAIR     HYDRADENITIS EXCISION  01/2011; 03/2012   'groin and abdomen; 03/2012" (09/14/2012)   HYDRADENITIS EXCISION  04/01/2012   Procedure: EXCISION HYDRADENITIS GROIN;  Surgeon: Valarie Merino, MD;  Location: [facility] ORS;  Service: General;  Laterality: Bilateral;  Excision of Hydradenitis of Perineum   HYDRADENITIS EXCISION N/A 09/17/2013   Procedure: EXCISION PERINEAL HIDRADENITIS ;  Surgeon: Valarie Merino, MD;  Location: [facility] ORS;  Service: General;  Laterality: N/A;  also in the pubis area   LEFT HEART CATH AND CORONARY ANGIOGRAPHY N/A 12/26/2016   Procedure: LEFT HEART CATH AND CORONARY ANGIOGRAPHY;  Surgeon: Swaziland, Peter M, MD;  Location: [facility] INVASIVE CV LAB;  Service: Cardiovascular;  Laterality: N/A;   MASS EXCISION Right 09/17/2013   Procedure: EXCISION MASS;  Surgeon: Valarie Merino, MD;  Location: [facility] ORS;  Service: General;  Laterality: Right;   NISSEN FUNDOPLICATION  1996   PERIPHERAL VASCULAR CATHETERIZATION  N/A 11/03/2014   Procedure: Lower Extremity Angiography;  Surgeon: Runell Gess, MD;  [facility];  Service: Cardiovascular;  Laterality: N/A;   PERIPHERAL VASCULAR CATHETERIZATION N/A 01/29/2016   Procedure: Lower Extremity Angiography;  Surgeon: Runell Gess, MD;  Location: [facility] INVASIVE CV LAB;  Service: Cardiovascular;  Laterality: N/A;   PERIPHERAL VASCULAR INTERVENTION Left 10/01/2022   Procedure: PERIPHERAL VASCULAR INTERVENTION;  Surgeon: Nada Libman, MD;  [facility]: [facility];  Service: Cardiovascular;  Laterality: Left;  fem-pop bypass  PERIPHERAL VASCULAR THROMBECTOMY Left 10/01/2022   Procedure: PERIPHERAL VASCULAR THROMBECTOMY;  Surgeon: Nada Libman, MD;  [facility]: [facility];  Service: Cardiovascular;  Laterality: Left;  fem-pop bypass   POLYPECTOMY  07/02/2019   Procedure: POLYPECTOMY;  Surgeon: Malissa Hippo, MD;  Location: [facility];  Service: Endoscopy;;   POSTERIOR LUMBAR FUSION  2008 X 2   REDUCTION MAMMAPLASTY  1996?   TONSILLECTOMY AND ADENOIDECTOMY  1959 AND 2000   UVULOPALATOPHARYNGOPLASTY, TONSILLECTOMY AND SEPTOPLASTY  2000's    Prior to Admission medications   Medication Sig Start Date End Date Taking? Authorizing Provider  albuterol (PROAIR HFA) 108 (90 Base) MCG/ACT inhaler 1-2 inhalations every 4-6 hours as needed for cough or wheeze. Patient taking differently: Inhale 1-2 puffs into the lungs every 4 (four) hours as needed for shortness of breath. 07/10/15  Yes Bobbitt, Heywood Iles, MD  ALPRAZolam Prudy Feeler) 1 MG tablet Take 1 mg by mouth 2 (two) times daily as needed for anxiety.   Yes [provider]  aspirin EC 81 MG tablet Take 81 mg by mouth daily.   Yes [provider]  Azelastine HCl 0.15 % SOLN Place 2 sprays into both nostrils 2 (two) times daily. Patient taking differently: Place 2 sprays into both nostrils daily as needed (allergies). 07/10/15  Yes Bobbitt, Heywood Iles, MD  bisacodyl  (DULCOLAX) 5 MG EC tablet Take 5 mg by mouth daily as needed for moderate constipation. 08/19/19  Yes Iva Boop, MD  clopidogrel (PLAVIX) 75 MG tablet Take 1 tablet (75 mg total) by mouth daily. 10/01/22  Yes Nada Libman, MD  doxycycline (VIBRAMYCIN) 100 MG capsule Take 1 capsule (100 mg total) by mouth 2 (two) times daily. Patient taking differently: Take 100 mg by mouth as needed (Infection). 02/20/21  Yes Hagler, Arlys John, MD  EPINEPHrine 0.3 mg/0.3 mL IJ SOAJ injection Inject 0.3 mLs (0.3 mg total) into the muscle once. Patient taking differently: Inject 0.3 mg into the muscle as needed for anaphylaxis. 08/02/15  Yes Bobbitt, Heywood Iles, MD  estradiol (ESTRACE) 0.1 MG/GM vaginal cream Place 1 Applicatorful vaginally daily. 07/16/23  Yes [provider]  famotidine (PEPCID) 40 MG tablet Take 40 mg by mouth daily as needed for heartburn or indigestion. 04/23/23  Yes [provider]  insulin lispro (HUMALOG) 100 UNIT/ML injection Max daily 60 units 03/13/23  Yes [name], Konrad Dolores, MD  losartan-hydrochlorothiazide (HYZAAR) 50-12.5 MG tablet Take 1 tablet by mouth daily. 08/18/19  Yes Jodelle Gross, NP  lubiprostone (AMITIZA) 8 MCG capsule Take 1 capsule (8 mcg total) by mouth 2 (two) times daily with a meal. Patient taking differently: Take 8 mcg by mouth 2 (two) times daily as needed for constipation. 01/09/23  Yes Iva Boop, MD  Magnesium 200 MG TABS Take 1 tablet (200 mg total) by mouth daily. Patient taking differently: Take 200 mg by mouth 2 (two) times daily. 09/17/22  Yes Iva Boop, MD  montelukast (SINGULAIR) 10 MG tablet Take 10 mg by mouth at bedtime as needed (allergies). 02/22/22  Yes [provider]  nitroGLYCERIN (NITROSTAT) 0.4 MG SL tablet PLACE 1 TAB UNDER TONGUE EVERY 5 MINS AS NEEDED FOR CHEST PAIN - MAX 3 DOSES THEN 911 12/06/20  Yes Runell Gess, MD  nortriptyline (PAMELOR) 10 MG capsule Take 1 capsule (10 mg total) by  mouth at bedtime. Patient taking differently: Take 10 mg by mouth at bedtime as needed (IBS). 06/05/23  Yes Iva Boop, MD  pantoprazole (PROTONIX) 40 MG tablet TAKE 1 TABLET BY MOUTH EVERY DAY Patient taking differently: Take 40 mg by mouth daily. 01/07/17  Yes Runell Gess, MD  polyethylene glycol (MIRALAX / GLYCOLAX) 17 g packet Take 17 g by mouth daily as needed for moderate constipation or severe constipation. 08/19/19  Yes Iva Boop, MD  potassium chloride (KLOR-CON) 10 MEQ tablet Take 10 mEq by mouth 2 (two) times daily. 10/01/19  Yes [provider]  rosuvastatin (CRESTOR) 40 MG tablet Take 1 tablet (40 mg total) by mouth daily. 05/27/22  Yes [name], Konrad Dolores, MD  SUMAtriptan (IMITREX) 100 MG tablet Take 100 mg by mouth every 2 (two) hours as needed for migraine.    Yes [provider]  terconazole (TERAZOL 3) 0.8 % vaginal cream Insert 1 applicatorful every day by vaginal route at bedtime for 3 days.   Yes [provider]  zolpidem (AMBIEN) 5 MG tablet Take 5 mg by mouth at bedtime as needed for sleep. 04/28/23  Yes [provider]  acetaminophen (TYLENOL) 325 MG tablet Take 2 tablets (650 mg total) by mouth every 6 (six) hours as needed for mild pain (or Fever >/= 101). Patient not taking: Reported on 07/18/2023 07/02/19   Shon Hale, MD  Continuous Blood Gluc Receiver (DEXCOM G6 RECEIVER) DEVI by Does not apply route.    [provider]  Continuous Glucose Sensor (DEXCOM G7 SENSOR) MISC 1 Device by Does not apply route as directed. 11/29/22   [name], Konrad Dolores, MD  hydrocortisone (ANUSOL-HC) 2.5 % rectal cream Place rectally daily as needed for hemorrhoids. Patient not taking: Reported on 07/18/2023 09/16/22   [provider]  Insulin Disposable Pump (OMNIPOD 5 G7 INTRO, GEN 5,) KIT 1 Device by Does not apply route every other day. 11/29/22   [name], Konrad Dolores, MD  Insulin Disposable Pump  (OMNIPOD 5 G7 PODS, GEN 5,) MISC 1 Device by Does not apply route every other day. 06/10/23   [name], Konrad Dolores, MD    Current Facility-Administered Medications  Medication Dose Route Frequency Provider Last Rate Last Admin   0.9 %  sodium chloride infusion (Manually program via Guardrails IV Fluids)   Intravenous Once Therisa Doyne, MD   Held at 07/17/23 2334   0.9 %  sodium chloride infusion   Intravenous Continuous Therisa Doyne, MD 50 mL/hr at 07/18/23 0606 Infusion Verify at 07/18/23 6578   acetaminophen (TYLENOL) tablet 650 mg  650 mg Oral Q6H PRN Therisa Doyne, MD       Or   acetaminophen (TYLENOL) suppository 650 mg  650 mg Rectal Q6H PRN Doutova, Anastassia, MD       albuterol (PROVENTIL) (2.5 MG/3ML) 0.083% nebulizer solution 2.5 mg  2.5 mg Nebulization Q2H PRN Doutova, Anastassia, MD       fentaNYL (SUBLIMAZE) injection 12.5-50 mcg  12.5-50 mcg Intravenous Q2H PRN Doutova, Anastassia, MD       HYDROcodone-acetaminophen (NORCO/VICODIN) 5-325 MG per tablet 1-2 tablet  1-2 tablet Oral Q4H PRN Doutova, Anastassia, MD       insulin pump   Subcutaneous Q4H Doutova, Anastassia, MD       montelukast (SINGULAIR) tablet 10 mg  10 mg Oral QHS PRN Doutova, Anastassia, MD       morphine (PF) 2 MG/ML injection 2 mg  2 mg Intravenous Q3H PRN Therisa Doyne, MD   2 mg at 07/18/23 0146   nortriptyline (PAMELOR) capsule 10 mg  10 mg Oral QHS Therisa Doyne, MD  ondansetron (ZOFRAN) tablet 4 mg  4 mg Oral Q6H PRN Therisa Doyne, MD   4 mg at 07/18/23 0301   Or   ondansetron (ZOFRAN) injection 4 mg  4 mg Intravenous Q6H PRN Doutova, Anastassia, MD       pantoprazole (PROTONIX) injection 40 mg  40 mg Intravenous Q12H Doutova, Anastassia, MD   40 mg at 07/18/23 0108   rosuvastatin (CRESTOR) tablet 40 mg  40 mg Oral Daily Therisa Doyne, MD        Allergies as of 07/17/2023 - Review Complete 07/17/2023  Allergen Reaction Noted   Sulfa antibiotics  Shortness Of Breath and Palpitations 04/19/2012   Codeine Other (See Comments) 08/02/2015   Fish allergy Hives and Swelling 03/25/2014   Iodine Swelling 12/14/2010   Metformin and related Diarrhea 07/03/2011   Soliqua [insulin glargine-lixisenatide] Itching and Other (See Comments) 10/06/2017   Shellfish allergy Swelling and Rash 09/11/2011    Family History  Problem Relation Age of Onset   Breast cancer Mother    Heart disease Mother    Hypertension Mother    Diabetes Father    Heart disease Father    Stroke Father    Heart disease Sister    Hypertension Sister    Hypertension Sister    Hyperlipidemia Sister    Diabetes Sister    Allergic rhinitis Sister    Emphysema Other        great uncle   Aneurysm Sister        brain   Colon cancer Paternal Uncle    Angioedema Neg Hx    Asthma Neg Hx    Eczema Neg Hx    Immunodeficiency Neg Hx    Urticaria Neg Hx     Social History   Socioeconomic History   Marital status: Divorced    Spouse name: Not on file   Number of children: 4   Years of education: some college   Highest education level: Some college, no degree  Occupational History   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 Allyson Sabal per pt.  Vaping Use   Vaping status: Every Day   Substances: Nicotine  Substance and Sexual Activity   Alcohol use: No    Alcohol/week: 0.0 standard drinks of alcohol   Drug use: No    Types: "Crack" cocaine    Comment: 05/09/2015.  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   She is a cigarette smoker, no alcohol no current drug use history of crack cocaine   Social Drivers of Corporate investment banker Strain:  Low Risk  (02/06/2022)   Overall Financial Resource Strain (CARDIA)    Difficulty of Paying Living Expenses: Not very hard  Food Insecurity: No Food Insecurity (07/18/2023)   Hunger Vital Sign    Worried About Running Out of Food in the Last Year: Never true    Ran Out of Food in the Last Year: Never true  Transportation Needs: No Transportation Needs (07/18/2023)   PRAPARE - Administrator, Civil Service (Medical): No    Lack of Transportation (Non-Medical): No  Physical Activity: Not on file  Stress: Stress Concern Present (02/17/2023)   [employer] of Occupational Health - Occupational Stress Questionnaire    Feeling of Stress : Rather much  Social Connections: Moderately Integrated (07/18/2023)   Social Connection and Isolation Panel [NHANES]    Frequency of Communication with Friends and Family: More than three times a week    Frequency of Social Gatherings with Friends and Family: More than three times a week    Attends Religious Services: More than 4 times per year    Active Member of [employer] or Organizations: Yes    Attends Banker Meetings: 1 to 4 times per year    Marital Status: Divorced  Intimate Partner Violence: Not At Risk (07/18/2023)   Humiliation, Afraid, Rape, and Kick questionnaire    Fear of Current or Ex-Partner: No    Emotionally Abused: No    Physically Abused: No    Sexually Abused: No     Review of Systems: Gen: Denies fever, sweats or chills. No weight loss.  CV: Denies chest pain, palpitations or edema. Resp: Denies cough, shortness of breath of hemoptysis.  GI: See HPI.   GU : Denies urinary burning, blood in urine, increased urinary frequency or incontinence. MS: Denies joint pain, muscles aches or weakness. Derm: Denies rash, itchiness, skin lesions or unhealing ulcers. Psych: Denies depression, anxiety, memory loss or confusion. Heme: Denies easy bruising, bleeding. Neuro: + Dizziness.  Endo:  + DM type II.  Physical Exam: Vital signs in last 24 hours: Temp:  [98 F (36.7 C)-98.9 F (37.2 C)] 98.9 F (37.2 C) (03/14 0425) Pulse Rate:  [69-97] 69 (03/14 0425) Resp:  [11-20] 18 (03/14 0425) BP: (108-165)/(57-98) 134/66 (03/14 0425) SpO2:  [98 %-100 %] 99 % (03/14 0425) Weight:  [58 kg] 58 kg (03/13 2210) Last BM Date : 07/15/23 General:  Alert 68 year old female in no acute distress. Head:  Normocephalic and atraumatic. Eyes:  No scleral icterus. Conjunctiva pink. Ears:  Normal auditory acuity. Nose:  No deformity, discharge or lesions. Mouth: Upper and lower dentures.  No ulcers or lesions.  Neck:  Supple. No lymphadenopathy or thyromegaly.  Lungs: Breath sounds clear throughout. No wheezes, rhonchi or crackles.  Heart: Regular rate and rhythm, no murmurs. Abdomen: Soft, nondistended.  Mild epigastric and RUQ tenderness without rebound or guarding.  Positive bowel sounds to all 4 quadrants.  No bruit.  No palpable mass or hepatosplenomegaly. Rectal: Deferred.  FOBT positive. Musculoskeletal:  Symmetrical without gross deformities.  Pulses:  Normal pulses noted. Extremities:  Without clubbing or edema. Neurologic:  Alert and  oriented x 4. No focal deficits.  Skin:  Intact without significant lesions or rashes. Psych:  Alert and cooperative. Normal mood and affect.  Intake/Output from previous day: 03/13 0701 - 03/14 0700 In: 484.2 [I.V.:3.5;  Blood:345.5; IV Piggyback:135.2] Out: -  Intake/Output this shift: No intake/output data recorded.  Lab Results: Recent Labs    07/17/23 2144  WBC  7.8  HGB 7.9*  HCT 26.4*  PLT 416*   BMET Recent Labs    07/17/23 2144  NA 134*  K 2.9*  CL 101  CO2 25  GLUCOSE 145*  BUN 15  CREATININE 1.09*  CALCIUM 9.5   LFT Recent Labs    07/17/23 2305  PROT 6.7  ALBUMIN 3.5  AST 18  ALT 13  ALKPHOS 54  BILITOT 1.2  BILIDIR 0.3*  IBILI 0.9   PT/INR Recent Labs    07/17/23 2305  LABPROT 13.8  INR 1.0   Hepatitis Panel No results for input(s): "HEPBSAG", "HCVAB", "HEPAIGM", "HEPBIGM" in the last 72 hours.    Studies/Results: DG Chest 2 View Result Date: 07/17/2023 CLINICAL DATA:  Shortness of breath EXAM: CHEST - 2 VIEW COMPARISON:  Chest x-ray 07/28/2019 FINDINGS: The heart size and mediastinal contours are within normal limits. Both lungs are clear. The visualized skeletal structures are unremarkable. IMPRESSION: No active cardiopulmonary disease. Electronically Signed   By: Darliss Cheney M.D.   On: 07/17/2023 23:56    IMPRESSION/PLAN:  68 year old female with a history of GERD, s/p Nissen fundoplication and UGI bleed/melena 06/2019 admitted with SOB, epigastric pain, dark stools and acute anemia, likely UGI bleed. FOBT +. Hg 7.9 down from 12.5. BUN 15. Transfused one unit of PRBCs. Last BM, dark stool was on 3/8.  Hemodynamically stable.  Last dose of Plavix was on Monday 3/10. -Await post transfusion H/H -Check H/H Q 6 hours x 24 hours then PRN -Transfuse for Hg level < 8 -NPO  -EGD this afternoon with Dr. Chales Abrahams, benefits and risks discussed including risk with sedation, risk of bleeding, perforation and infection, timing to be determined. -Hold Plavix  -Pantoprazole 40mg  IV bid -ondansetron 4 mg p.o. or IV every 6 hours as needed -IV fluids and pain management per the hospitalist   History of colon polyps. Colonoscopy 03/02/2020 identified 3 small tubular  adenomatous polyps removed from the colon.  -Next colonoscopy due 02/2025    Coronary artery disease, nonobstructive CAD per cardiac cath 2018.  Echo 06/2019 with LVEF 65 to 70%.  Echo done today, results pending.   Peripheral arterial disease s/p L SFA stent 10/2014, s/p left fem-pop bypass with graft 06/2016 with occlusion s/p thrombectomy with restenting 10/01/2022.  On Plavix and ASA.  Last dose of Plavix was on Monday 3/10. -Continue to hold Plavix   History of CVA x 3   UTI on Ceftriaxone IV   [name]  07/18/2023, 8: 48 AM    Attending physician's note   I have taken history, reviewed the chart and examined the patient. I performed a substantive portion of this encounter, including complete performance of at least one of the key components, in conjunction with the APP. I agree with the Advanced Practitioner's note, impression and recommendations.   Melena s/o UGI bleed on ASA/plavix (last dose 3/11). Hb drop from 12.5 to 8 with syncope. HD stable. Neg recent EGD Jan 2025. Has H/O Nissen fundoplication which seems to have partially slipped.   H/O colon polyps on colonoscopy 02/2020. Rpt due 2026  PAD s/p stenting/graft, H/O CVA on ASA/plavix  Plan: -Push enteroscopy today -IV Protonix -Trend CBC.  Transfuse to keep Hb>8 -Hold Plavix. -If neg, then CTA, rpt colon followed by VCE, if needed   Edman Circle, MD [facility] [phone]

## 2023-07-18 NOTE — Assessment & Plan Note (Signed)
 In the setting of likely GERD appreciate GI consult continue Protonix 40 mg IV twice daily

## 2023-07-18 NOTE — Transfer of Care (Signed)
 Immediate Anesthesia Transfer of Care Note  Patient: [name]  Procedure(s) Performed: ENTEROSCOPY  Patient Location: PACU  Anesthesia Type:MAC  Level of Consciousness: sedated  Airway & Oxygen Therapy: Patient Spontanous Breathing and Patient connected to face mask oxygen  Post-op Assessment: Report given to RN and Post -op Vital signs reviewed and stable  Post vital signs: Reviewed and stable  Last Vitals:  Vitals Value Taken Time  BP    Temp    Pulse    Resp    SpO2      Last Pain:  Vitals:   07/18/23 1241  TempSrc:   PainSc: 4       Patients Stated Pain Goal: 4 (07/18/23 0756)  Complications: No notable events documented.

## 2023-07-18 NOTE — Assessment & Plan Note (Signed)
 Transfuse 1 unit PRBCs   Follow serial CBC

## 2023-07-18 NOTE — Assessment & Plan Note (Signed)
Continue Crestor 40 mg a day 

## 2023-07-18 NOTE — Assessment & Plan Note (Signed)
 Intermatic anemia.  Will transfuse and follow clinically. Obtain echogram for completion monitor on telemetry

## 2023-07-18 NOTE — Anesthesia Preprocedure Evaluation (Addendum)
 Anesthesia Evaluation  Patient identified by MRN, date of birth, ID band Patient awake    Reviewed: Allergy & Precautions, NPO status , Patient's Chart, lab work & pertinent test results  Airway Mallampati: III  TM Distance: >3 FB Neck ROM: Full    Dental  (+) Dental Advisory Given, Lower Dentures, Upper Dentures   Pulmonary asthma , sleep apnea , Recent URI , former smoker   Pulmonary exam normal breath sounds clear to auscultation       Cardiovascular hypertension, Pt. on medications + CAD, + Past MI and + Peripheral Vascular Disease  Normal cardiovascular exam Rhythm:Regular Rate:Normal     Neuro/Psych  Headaches PSYCHIATRIC DISORDERS Anxiety      Neuromuscular disease CVA, Residual Symptoms    GI/Hepatic Neg liver ROS, hiatal hernia, PUD,GERD  Medicated,,Melena    Endo/Other  diabetes, Type 2, Insulin Dependent    Renal/GU negative Renal ROS     Musculoskeletal  (+) Arthritis ,    Abdominal   Peds  Hematology  (+) Blood dyscrasia (Plavix), anemia   Anesthesia Other Findings   Reproductive/Obstetrics                             Anesthesia Physical Anesthesia Plan  ASA: 3  Anesthesia Plan: MAC   Post-op Pain Management: Minimal or no pain anticipated   Induction: Intravenous  PONV Risk Score and Plan: 2 and TIVA and Treatment may vary due to age or medical condition  Airway Management Planned: Simple Face Mask and Natural Airway  Additional Equipment:   Intra-op Plan:   Post-operative Plan:   Informed Consent: I have reviewed the patients History and Physical, chart, labs and discussed the procedure including the risks, benefits and alternatives for the proposed anesthesia with the patient or authorized representative who has indicated his/her understanding and acceptance.     Dental advisory given  Plan Discussed with: CRNA  Anesthesia Plan Comments:          Anesthesia Quick Evaluation

## 2023-07-18 NOTE — ED Notes (Signed)
 ED TO INPATIENT HANDOFF REPORT  ED Nurse Name and Phone #: Majel Homer, RN  S Name/Age/Gender [name] 68 y.o. female Room/Bed: WA23/WA23  Code Status   Code Status: Full Code  Home/SNF/Other Home Patient oriented to: self, place, time, and situation Is this baseline? Yes   Triage Complete: Triage complete  Chief Complaint Upper GI bleed [K92.2]  Triage Note Pt states that she has been having SOB for the past few days and feeling like she is going to pass out. Pt speaks in short sentences. Pt states that she has been having high CBG over the past few days as well.    Allergies 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   Fish Allergy Hives and Swelling    Tongue swelling   Iodine Swelling   Metformin And Related Diarrhea   [name] [Insulin Glargine-Lixisenatide] Itching and Other (See Comments)    "tongue swelling"   Shellfish Allergy Swelling and Rash    Tongue swelling    Level of Care/Admitting Diagnosis ED Disposition     ED Disposition  Admit   Condition  --   Comment  Hospital Area: [facility] [[id number]]  Level of Care: Progressive [102]  Admit to Progressive based on following criteria: GI, ENDOCRINE disease patients with GI bleeding, acute liver failure or pancreatitis, stable with diabetic ketoacidosis or thyrotoxicosis (hypothyroid) state.  May admit patient to [facility] or [facility] if equivalent level of care is available:: No  Covid Evaluation: Asymptomatic - no recent exposure (last 10 days) testing not required  Diagnosis: Upper GI bleed [161096]  Admitting Physician: Therisa Doyne [[id number]]  Attending Physician: Therisa Doyne [[id number]]  Certification:: I certify this patient will need inpatient services for at least 2 midnights          B Medical/Surgery History Past Medical  History:  Diagnosis Date   Allergic urticaria 07/10/2015   Allergy    Anemia    hx   Angioedema 07/10/2015   Anxiety    Arthritis    "neck, left hand" (09/14/2012)   Asthma    CAD (coronary artery disease)    MI 2003 and 2013   Cancer (HCC) 1985   ovarian, no treatment except surgery   Cataract    Complication of anesthesia    OCCASIONAL TROUBLE TURNING NECK TO RIGHT   Critical lower limb ischemia (HCC)    10/2014 s/p L SFA stenting   Diverticulosis of colon with hemorrhage 08/2011   GERD (gastroesophageal reflux disease)    GI bleed    H/O hiatal hernia    Hidradenitis    groin   History of blood transfusion 1985 AND 2013   Hyperlipidemia    Hypertension    Irritable bowel syndrome    Left-sided weakness    since stroke, left eye trouble seeing   Migraines    Neuropathy    Obesity    Osteoporosis    PAD (peripheral artery disease) (HCC)    a. critical limb ischemia s/p PTA/stenting of L SFA 10/2014. c. occ prior SFA stent by angio 01/2016, for possible PV bypass.   Pneumonia    baby   Recurrent upper respiratory infection (URI)    S/P arterial stent-mid Lt SFA 11/03/14 11/04/2014   Schatzki's ring    Sinus problem    Stomach ulcer 1972   non-bleeding  Stroke (HCC) 07-2007, 07-2008, 07-2009   total 3 strokes; mild left sided weakness and left eye "jumps".   Type II diabetes mellitus (HCC)    Vitamin B 12 deficiency 07/15/2013   Past Surgical History:  Procedure Laterality Date   ABDOMINAL AORTOGRAM W/LOWER EXTREMITY Left 10/01/2022   Procedure: ABDOMINAL AORTOGRAM W/LOWER EXTREMITY;  Surgeon: Nada Libman, MD;  [facility];  Service: Cardiovascular;  Laterality: Left;   ABDOMINAL HYSTERECTOMY  1985   ADENOIDECTOMY     ANTERIOR CERVICAL DECOMP/DISCECTOMY FUSION  2002   ANTERIOR CERVICAL DECOMP/DISCECTOMY FUSION N/A 04/08/2014   Procedure: Cervical Six-Seven ANTERIOR CERVICAL DECOMPRESSION/DISCECTOMY FUSION Plating and Bonegraft  2 LEVELS;   Surgeon: Coletta Memos, MD;  Location: [facility] NEURO ORS;  Service: Neurosurgery;  Laterality: N/A;  Cervical Six-Seven ANTERIOR CERVICAL DECOMPRESSION/DISCECTOMY FUSION Plating and Bonegraft  2 LEVELS   APPENDECTOMY  1985   AXILLARY HIDRADENITIS EXCISION  1990-2008   bilateral   BACK SURGERY     BREAST BIOPSY Right 2007   BREAST CYST EXCISION Right 2008   BREAST REDUCTION SURGERY     CARDIAC CATHETERIZATION  2004   mild disease   CATARACT EXTRACTION W/PHACO Left 05/16/2015   Procedure: CATARACT EXTRACTION PHACO AND INTRAOCULAR LENS PLACEMENT (IOC);  Surgeon: Jethro Bolus, MD;  Location: [facility] ORS;  Service: Ophthalmology;  Laterality: Left;  CDE: 4.24   CATARACT EXTRACTION W/PHACO Right 05/30/2015   Procedure: CATARACT EXTRACTION RIGHT EYE PHACO AND INTRAOCULAR LENS PLACEMENT ;  Surgeon: Jethro Bolus, MD;  Location: [facility] ORS;  Service: Ophthalmology;  Laterality: Right;  CDE:4.08   CHOLECYSTECTOMY  1990's   COLONOSCOPY  08/12/2011   Procedure: COLONOSCOPY;  Surgeon: Meryl Dare, MD,FACG;  Location[facility] ENDOSCOPY;  Service: Endoscopy;  Laterality: N/A;   COLONOSCOPY  06/19/2006   COLONOSCOPY WITH PROPOFOL N/A 07/02/2019   Procedure: COLONOSCOPY WITH PROPOFOL;  Surgeon: Malissa Hippo, MD;  Location: [facility] ENDO [facility];  Service: Endoscopy;  Laterality: N/A;   cyst thigh Right    ESOPHAGOGASTRODUODENOSCOPY  08/12/2011   Procedure: ESOPHAGOGASTRODUODENOSCOPY (EGD);  Surgeon: Meryl Dare, MD,FACG;  Location: [facility] ENDOSCOPY;  Service: Endoscopy;  Laterality: N/A;   ESOPHAGOGASTRODUODENOSCOPY  06/04/2005   ESOPHAGOGASTRODUODENOSCOPY (EGD) WITH PROPOFOL N/A 07/01/2019   Procedure: ESOPHAGOGASTRODUODENOSCOPY (EGD) WITH PROPOFOL;  Surgeon: Malissa Hippo, MD;  [facility];  Service: Endoscopy;  Laterality: N/A;   FEMORAL-POPLITEAL BYPASS GRAFT Left 06/19/2016   Procedure: Left Leg BYPASS GRAFT FEMORAL-POPLITEAL ARTERY;  Surgeon: Nada Libman, MD;  Location: [facility] OR;  Service: Vascular;  Laterality:  Left;   GIVENS CAPSULE STUDY  08/13/2011   Procedure: GIVENS CAPSULE STUDY;  Surgeon: Meryl Dare, MD,FACG;  Location: [facility] ENDOSCOPY;  Service: Endoscopy;  Laterality: N/A;   HAMMER TOE SURGERY Bilateral ~ 2000   HEMOSTASIS CLIP PLACEMENT  07/02/2019   Procedure: HEMOSTASIS CLIP PLACEMENT;  Surgeon: Malissa Hippo, MD;  Location: [facility];  Service: Endoscopy;;  hepatic flexure   HIATAL HERNIA REPAIR     HYDRADENITIS EXCISION  01/2011; 03/2012   'groin and abdomen; 03/2012" (09/14/2012)   HYDRADENITIS EXCISION  04/01/2012   Procedure: EXCISION HYDRADENITIS GROIN;  Surgeon: Valarie Merino, MD;  Location: [facility] ORS;  Service: General;  Laterality: Bilateral;  Excision of Hydradenitis of Perineum   HYDRADENITIS EXCISION N/A 09/17/2013   Procedure: EXCISION PERINEAL HIDRADENITIS ;  Surgeon: Valarie Merino, MD;  Location: [facility] ORS;  Service: General;  Laterality: N/A;  also in the pubis area   LEFT HEART CATH  AND CORONARY ANGIOGRAPHY N/A 12/26/2016   Procedure: LEFT HEART CATH AND CORONARY ANGIOGRAPHY;  Surgeon: Swaziland, Peter M, MD;  Location: [facility] INVASIVE CV LAB;  Service: Cardiovascular;  Laterality: N/A;   MASS EXCISION Right 09/17/2013   Procedure: EXCISION MASS;  Surgeon: Valarie Merino, MD;  Location: [facility] ORS;  Service: General;  Laterality: Right;   NISSEN FUNDOPLICATION  1996   PERIPHERAL VASCULAR CATHETERIZATION N/A 11/03/2014   Procedure: Lower Extremity Angiography;  Surgeon: Runell Gess, MD;  [facility];  Service: Cardiovascular;  Laterality: N/A;   PERIPHERAL VASCULAR CATHETERIZATION N/A 01/29/2016   Procedure: Lower Extremity Angiography;  Surgeon: Runell Gess, MD;  Location: [facility] INVASIVE CV LAB;  Service: Cardiovascular;  Laterality: N/A;   PERIPHERAL VASCULAR INTERVENTION Left 10/01/2022   Procedure: PERIPHERAL VASCULAR INTERVENTION;  Surgeon: Nada Libman, MD;  [facility];  Service: Cardiovascular;  Laterality: Left;  fem-pop bypass    PERIPHERAL VASCULAR THROMBECTOMY Left 10/01/2022   Procedure: PERIPHERAL VASCULAR THROMBECTOMY;  Surgeon: Nada Libman, MD;  [facility];  Service: Cardiovascular;  Laterality: Left;  fem-pop bypass   POLYPECTOMY  07/02/2019   Procedure: POLYPECTOMY;  Surgeon: Malissa Hippo, MD;  Location: [facility];  Service: Endoscopy;;   POSTERIOR LUMBAR FUSION  2008 X 2   REDUCTION MAMMAPLASTY  1996?   TONSILLECTOMY AND ADENOIDECTOMY  1959 AND 2000   UVULOPALATOPHARYNGOPLASTY, TONSILLECTOMY AND SEPTOPLASTY  2000's     A IV Location/Drains/Wounds Patient Lines/Drains/Airways Status     Active Line/Drains/Airways     Name Placement date Placement time Site Days   Peripheral IV 07/17/23 20 G 1" Anterior;Right Forearm 07/17/23  2323  Forearm  1            Intake/Output Last 24 hours No intake or output data in the 24 hours ending 07/18/23 0246  Labs/Imaging Results for orders placed or performed during the hospital encounter of 07/17/23 (from the past 48 hours)  CBG monitoring, ED     Status: Abnormal   Collection Time: 07/17/23  9:38 PM  Result Value Ref Range   Glucose-Capillary 111 (H) 70 - 99 mg/dL    Comment: Glucose reference range applies only to samples taken after fasting for at least 8 hours.  CBC     Status: Abnormal   Collection Time: 07/17/23  9:44 PM  Result Value Ref Range   WBC 7.8 4.0 - 10.5 K/uL   RBC 2.92 (L) 3.87 - 5.11 MIL/uL   Hemoglobin 7.9 (L) 12.0 - 15.0 g/dL   HCT 16.1 (L) 09.6 - 04.5 %   MCV 90.4 80.0 - 100.0 fL   MCH 27.1 26.0 - 34.0 pg   MCHC 29.9 (L) 30.0 - 36.0 g/dL   RDW 40.9 (H) 81.1 - 91.4 %   Platelets 416 (H) 150 - 400 K/uL   nRBC 1.5 (H) 0.0 - 0.2 %    Comment: Performed at [facility], [address], [place], [place] [address]  Basic metabolic panel     Status: Abnormal   Collection Time: 07/17/23  9:44 PM  Result Value Ref Range   Sodium 134 (L) 135 - 145 mmol/L   Potassium 2.9 (L) 3.5 - 5.1  mmol/L   Chloride 101 98 - 111 mmol/L   CO2 25 22 - 32 mmol/L   Glucose, Bld 145 (H) 70 - 99 mg/dL    Comment: Glucose reference range applies only to samples taken after fasting for  at least 8 hours.   BUN 15 8 - 23 mg/dL   Creatinine, Ser 5.40 (H) 0.44 - 1.00 mg/dL   Calcium 9.5 8.9 - 98.1 mg/dL   GFR, Estimated 55 (L) >60 mL/min    Comment: (NOTE) Calculated using the CKD-EPI Creatinine Equation (2021)    Anion gap 8 5 - 15    Comment: Performed at [facility], [address], [place], [place] [address]  Reticulocytes     Status: Abnormal   Collection Time: 07/17/23  9:44 PM  Result Value Ref Range   Retic Ct Pct 4.5 (H) 0.4 - 3.1 %   RBC. 2.81 (L) 3.87 - 5.11 MIL/uL   Retic Count, Absolute 125.9 19.0 - 186.0 K/uL   Immature Retic Fract 34.0 (H) 2.3 - 15.9 %    Comment: Performed at [facility], [address], [place], [place] [address]  POC occult blood, ED Provider will collect     Status: Abnormal   Collection Time: 07/17/23 10:36 PM  Result Value Ref Range   Fecal Occult Bld POSITIVE (A) NEGATIVE  Hepatic function panel     Status: Abnormal   Collection Time: 07/17/23 11:05 PM  Result Value Ref Range   Total Protein 6.7 6.5 - 8.1 g/dL   Albumin 3.5 3.5 - 5.0 g/dL   AST 18 15 - 41 U/L   ALT 13 0 - 44 U/L   Alkaline Phosphatase 54 38 - 126 U/L   Total Bilirubin 1.2 0.0 - 1.2 mg/dL   Bilirubin, Direct 0.3 (H) 0.0 - 0.2 mg/dL   Indirect Bilirubin 0.9 0.3 - 0.9 mg/dL    Comment: Performed at [facility], [address] [place], [place] [address]  Magnesium     Status: Abnormal   Collection Time: 07/17/23 11:05 PM  Result Value Ref Range   Magnesium 2.6 (H) 1.7 - 2.4 mg/dL    Comment: Performed at [facility], [address] [place], [place] [address]  Lipase, blood     Status: None   Collection Time: 07/17/23 11:05 PM  Result Value Ref Range   Lipase 27 11 - 51 U/L    Comment: Performed  at [facility], [address], [place], [place] [address]  Troponin I (High Sensitivity)     Status: None   Collection Time: 07/17/23 11:05 PM  Result Value Ref Range   Troponin I (High Sensitivity) 8 <18 ng/L    Comment: (NOTE) Elevated high sensitivity troponin I (hsTnI) values and significant  changes across serial measurements may suggest ACS but many other  chronic and acute conditions are known to elevate hsTnI results.  Refer to the "Links" section for chest pain algorithms and additional  guidance. Performed at [facility], [address], [place], [place] [address]   Protime-INR     Status: None   Collection Time: 07/17/23 11:05 PM  Result Value Ref Range   Prothrombin Time 13.8 11.4 - 15.2 seconds   INR 1.0 0.8 - 1.2    Comment: (NOTE) INR goal varies based on device and disease states. Performed at [facility], [address] [place], [place] [address]   Resp panel by RT-PCR (RSV, Flu A&B, Covid) Anterior Nasal Swab     Status: None   Collection Time: 07/17/23 11:10 PM   Specimen: Anterior Nasal Swab  Result Value Ref Range   SARS Coronavirus 2 by RT PCR NEGATIVE NEGATIVE  Comment: (NOTE) SARS-CoV-2 target nucleic acids are NOT DETECTED.  The SARS-CoV-2 RNA is generally detectable in upper respiratory specimens during the acute phase of infection. The lowest concentration of SARS-CoV-2 viral copies this assay can detect is 138 copies/mL. A negative result does not preclude SARS-Cov-2 infection and should not be used as the sole basis for treatment or other patient management decisions. A negative result may occur with  improper specimen collection/handling, submission of specimen other than nasopharyngeal swab, presence of viral mutation(s) within the areas targeted by this assay, and inadequate number of viral copies(<138 copies/mL). A negative result must be combined with clinical observations,  patient history, and epidemiological information. The expected result is Negative.  Fact Sheet for Patients:  BloggerCourse.com  Fact Sheet for Healthcare Providers:  SeriousBroker.it  This test is no t yet approved or cleared by the [place] FDA and  has been authorized for detection and/or diagnosis of SARS-CoV-2 by FDA under an Emergency Use Authorization (EUA). This EUA will remain  in effect (meaning this test can be used) for the duration of the COVID-19 declaration under Section 564(b)(1) of the Act, 21 U.S.C.section 360bbb-3(b)(1), unless the authorization is terminated  or revoked sooner.       Influenza A by PCR NEGATIVE NEGATIVE   Influenza B by PCR NEGATIVE NEGATIVE    Comment: (NOTE) The Xpert Xpress SARS-CoV-2/FLU/RSV plus assay is intended as an aid in the diagnosis of influenza from Nasopharyngeal swab specimens and should not be used as a sole basis for treatment. Nasal washings and aspirates are unacceptable for Xpert Xpress SARS-CoV-2/FLU/RSV testing.  Fact Sheet for Patients: BloggerCourse.com  Fact Sheet for Healthcare Providers: SeriousBroker.it  This test is not yet approved or cleared by the [place] FDA and has been authorized for detection and/or diagnosis of SARS-CoV-2 by FDA under an Emergency Use Authorization (EUA). This EUA will remain in effect (meaning this test can be used) for the duration of the COVID-19 declaration under Section 564(b)(1) of the Act, 21 U.S.C. section 360bbb-3(b)(1), unless the authorization is terminated or revoked.     Resp Syncytial Virus by PCR NEGATIVE NEGATIVE    Comment: (NOTE) Fact Sheet for Patients: BloggerCourse.com  Fact Sheet for Healthcare Providers: SeriousBroker.it  This test is not yet approved or cleared by the [place] FDA and has  been authorized for detection and/or diagnosis of SARS-CoV-2 by FDA under an Emergency Use Authorization (EUA). This EUA will remain in effect (meaning this test can be used) for the duration of the COVID-19 declaration under Section 564(b)(1) of the Act, 21 U.S.C. section 360bbb-3(b)(1), unless the authorization is terminated or revoked.  Performed at [facility], [address] [place], [place] [address]   Type and screen [facility]     Status: None (Preliminary result)   Collection Time: 07/17/23 11:15 PM  Result Value Ref Range   ABO/RH(D) B POS    Antibody Screen POS    Sample Expiration 07/20/2023,2359    Antibody Identification ANTI FYA (Duffy a) ANTI K    Unit Number [id number]    Blood Component Type RED CELLS,LR    Unit division 00    Status of Unit ISSUED    Donor AG Type      NEGATIVE FOR KELL ANTIGEN NEGATIVE FOR DUFFY A ANTIGEN   Transfusion Status OK TO TRANSFUSE    Crossmatch Result COMPATIBLE   Prepare RBC     Status: None   Collection Time: 07/17/23 11:15 PM  Result Value Ref Range   Order Confirmation      ORDER PROCESSED BY BLOOD BANK Performed at [facility], [address] [place], [place] [address]   Troponin I (High Sensitivity)     Status: None   Collection Time: 07/18/23  1:05 AM  Result Value Ref Range   Troponin I (High Sensitivity) 7 <18 ng/L    Comment: (NOTE) Elevated high sensitivity troponin I (hsTnI) values and significant  changes across serial measurements may suggest ACS but many other  chronic and acute conditions are known to elevate hsTnI results.  Refer to the "Links" section for chest pain algorithms and additional  guidance. Performed at [facility], [address], [place], [place] [address]   CK     Status: Abnormal   Collection Time: 07/18/23  1:05 AM  Result Value Ref Range   Total CK 31 (L) 38 - 234 U/L    Comment: Performed at [facility], [address], [place], [place] [address]  Phosphorus     Status: None   Collection Time: 07/18/23  1:05 AM  Result Value Ref Range   Phosphorus 2.8 2.5 - 4.6 mg/dL    Comment: Performed at [facility], [address] [place], [place] [address]  CBG monitoring, ED     Status: None   Collection Time: 07/18/23  1:05 AM  Result Value Ref Range   Glucose-Capillary 90 70 - 99 mg/dL    Comment: Glucose reference range applies only to samples taken after fasting for at least 8 hours.  Creatinine, urine, random     Status: None   Collection Time: 07/18/23  1:11 AM  Result Value Ref Range   Creatinine, Urine 86 mg/dL    Comment: Performed at [facility], [address] [place], [place] [address]  Sodium, urine, random     Status: None   Collection Time: 07/18/23  1:11 AM  Result Value Ref Range   Sodium, Ur 17 mmol/L    Comment: Performed at [facility], [address], [place], [place] [address]  Urinalysis, Complete w Microscopic -Urine, Clean Catch     Status: Abnormal   Collection Time: 07/18/23  1:12 AM  Result Value Ref Range   Color, Urine YELLOW YELLOW   APPearance HAZY (A) CLEAR   Specific Gravity, Urine 1.010 1.005 - 1.030   pH 5.0 5.0 - 8.0   Glucose, UA NEGATIVE NEGATIVE mg/dL   Hgb urine dipstick SMALL (A) NEGATIVE   Bilirubin Urine NEGATIVE NEGATIVE   Ketones, ur NEGATIVE NEGATIVE mg/dL   Protein, ur 253 (A) NEGATIVE mg/dL   Nitrite POSITIVE (A) NEGATIVE   Leukocytes,Ua TRACE (A) NEGATIVE   RBC / HPF 0-5 0 - 5 RBC/hpf   WBC, UA 21-50 0 - 5 WBC/hpf   Bacteria, UA MANY (A) NONE SEEN   Squamous Epithelial / HPF 0-5 0 - 5 /HPF   Mucus PRESENT     Comment: Performed at [facility], [address] [place], [place] [address]   DG Chest 2 View Result Date: 07/17/2023 CLINICAL DATA:  Shortness of breath EXAM: CHEST - 2 VIEW COMPARISON:  Chest x-ray 07/28/2019 FINDINGS:  The heart size and mediastinal contours are within normal limits. Both lungs are clear. The visualized skeletal structures are unremarkable. IMPRESSION: No active cardiopulmonary disease. Electronically Signed   By: Darliss Cheney M.D.   On: 07/17/2023 23:56    Pending Labs  Unresulted Labs (From admission, onward)     Start     Ordered   07/18/23 1000  CBC  Now then every 6 hours,   R      07/18/23 0049   07/18/23 0500  Vitamin B12  (Anemia Panel (PNL))  Tomorrow morning,   R        07/18/23 0027   07/18/23 0500  Folate  (Anemia Panel (PNL))  Tomorrow morning,   R        07/18/23 0027   07/18/23 0120  TSH  Once,   AD        07/18/23 0120   07/18/23 0028  Iron and TIBC  (Anemia Panel (PNL))  Add-on,   AD        07/18/23 0027   07/18/23 0028  Ferritin  (Anemia Panel (PNL))  Add-on,   AD        07/18/23 0027   07/18/23 0028  Osmolality, urine  Once,   R        07/18/23 0027   07/18/23 0028  Osmolality  Add-on,   AD        07/18/23 0027   Signed and Held  HIV Antibody (routine testing w rflx)  (HIV Antibody (Routine testing w reflex) panel)  Once,   R        Signed and Held   Signed and Held  Magnesium  Tomorrow morning,   R        Signed and Held   Signed and Held  Phosphorus  Tomorrow morning,   R        Signed and Held   Signed and Held  Comprehensive metabolic panel  Tomorrow morning,   R       Question:  Release to patient  Answer:  Immediate   Signed and Held   Signed and Held  CBC  Tomorrow morning,   R       Question:  Release to patient  Answer:  Immediate   Signed and Held   Signed and Held  Hemoglobin A1c  Tomorrow morning,   R        Signed and Held            Vitals/Pain Today's Vitals   07/18/23 0151 07/18/23 0200 07/18/23 0215 07/18/23 0230  BP: (!) 113/57 125/67 125/67 (!) 165/98  Pulse: 81 76 80 86  Resp: 14 13 19 15   Temp: 98.7 F (37.1 C) 98 F (36.7 C) 98 F (36.7 C)   TempSrc: Oral Oral    SpO2: 99% 100% 99% 100%  Weight:      Height:       PainSc:        Isolation Precautions No active isolations  Medications Medications  0.9 %  sodium chloride infusion (Manually program via Guardrails IV Fluids) (0 mLs Intravenous Hold 07/17/23 2334)  potassium chloride 10 mEq in 100 mL IVPB (10 mEq Intravenous New Bag/Given 07/18/23 0110)  pantoprazole (PROTONIX) injection 40 mg (40 mg Intravenous Given 07/18/23 0108)  insulin pump (0 each Subcutaneous Hold 07/18/23 0111)  morphine (PF) 2 MG/ML injection 2 mg (2 mg Intravenous Given 07/18/23 0146)  0.9 %  sodium chloride infusion (Manually program via Guardrails IV Fluids) (0 mLs Intravenous Stopped 07/18/23 0155)    Mobility walks     Focused Assessments See Chart   R Recommendations: See Admitting Provider Note  Report given to:   Additional Notes: See Chart

## 2023-07-18 NOTE — Assessment & Plan Note (Signed)
 Troponin unremarkable in the setting of symptomatic anemia we will transfuse a supportive management repeat echogram in a.m. given syncope

## 2023-07-18 NOTE — Assessment & Plan Note (Signed)
-   will replace electrolytes and repeat  check Mg, phos and Ca level and replace as needed Monitor on telemetry   Lab Results  Component Value Date   K 2.9 (L) 07/17/2023     Lab Results  Component Value Date   CREATININE 1.09 (H) 07/17/2023   Lab Results  Component Value Date   MG 2.6 (H) 07/17/2023   Lab Results  Component Value Date   CALCIUM 9.5 07/17/2023   PHOS 2.2 (L) 06/30/2019

## 2023-07-18 NOTE — Assessment & Plan Note (Signed)
 Insulin pump in place.  Diabetes coordinator consult insulin pump order set Follow CBG every 4 hours

## 2023-07-18 NOTE — Op Note (Addendum)
 [facility] Patient Name: [name] Procedure Date: 07/18/2023 MRN: [id number] Attending MD: Lynann Bologna , MD, [id number] Date of Birth: 1955-12-28 CSN: [id number] Age: 68 Admit Type: Inpatient Procedure:                Small bowel enteroscopy Indications:              Melena s/o UGI bleed on ASA/plavix (last dose                            3/11). Hb drop from 12.5 to 8 with syncope. HD                            stable. Neg recent EGD Jan 2025. Has H/O Nissen                            fundoplication which seems to have partially                            slipped. Providers:                Lynann Bologna, MD, Rogue Jury, RN, Salley Scarlet, Technician, Albertina Senegal. Alday CRNA, CRNA Referring MD:              Medicines:                Monitored Anesthesia Care Complications:            Small iatrogenic Mallory-Weiss tear was noted. Estimated Blood Loss:     Estimated blood loss was minimal. Procedure:                Pre-Anesthesia Assessment:                           - Prior to the procedure, a History and Physical                            was performed, and patient medications and                            allergies were reviewed. The patient's tolerance of                            previous anesthesia was also reviewed. The risks                            and benefits of the procedure and the sedation                            options and risks were discussed with the patient.                            All questions were answered, and informed consent  was obtained. Prior Anticoagulants: The patient has                            taken Plavix (clopidogrel), last dose was 3 days                            prior to procedure. ASA Grade Assessment: III - A                            patient with severe systemic disease. After                            reviewing the risks and benefits, the patient was                             deemed in satisfactory condition to undergo the                            procedure.                           After obtaining informed consent, the endoscope was                            passed under direct vision. Throughout the                            procedure, the patient's blood pressure, pulse, and                            oxygen saturations were monitored continuously. The                            PCF-H190TL ([id number] Olympus slim colonoscope was                            introduced through the mouth, and advanced to the                            proximal jejunum. After obtaining informed consent,                            the endoscope was passed under direct vision.                            Throughout the procedure, the patient's blood                            pressure, pulse, and oxygen saturations were                            monitored continuously.The upper GI endoscopy was  accomplished without difficulty. The patient                            tolerated the procedure well. Scope In: Scope Out: Findings:      The distal esophagus was mildly tortuous.      A small hiatal hernia was present s/o .      Evidence of a prior Nissen fundoplication was found in the stomach.      Diffuse minimal inflammation characterized by erythema was found in the       entire examined stomach.      The examined duodenum was normal.      Exam of the jejunum was otherwise normal. Impression:               - Small hiatal hernia s/o slipped Nissen's.                           - Otherwise normal enteroscopy. No evidence of any                            active bleeding.                           - No specimens collected. Moderate Sedation:      none Recommendation:           - Return patient to hospital ward for ongoing care.                           - Clear liquid diet.                           - Continue present  medications.                           - CT AP with contrast (has low ?allergic to iodine-                            will desensitize).                           - If neg, consider colonoscopy. Ideally would                            recommend Plavix washout for 5 days, in case                            polypectomy is needed. Hence, ideally on Monday                            3/17 unless active bleeding.                           - The findings and recommendations were discussed                            with the patient's family. Procedure Code(s):        ---  Professional ---                           [phone], Esophagogastroduodenoscopy, flexible,                            transoral; diagnostic, including collection of                            specimen(s) by brushing or washing, when performed                            (separate procedure) Diagnosis Code(s):        --- Professional ---                           Q39.9, Congenital malformation of esophagus,                            unspecified                           K44.9, Diaphragmatic hernia without obstruction or                            gangrene                           Z98.890, Other specified postprocedural states                           K29.70, Gastritis, unspecified, without bleeding                           K92.1, Melena (includes Hematochezia) CPT copyright 2022 American Medical Association. All rights reserved. The codes documented in this report are preliminary and upon coder review may  be revised to meet current compliance requirements. Lynann Bologna, MD 07/18/2023 2:42:27 PM This report has been signed electronically. Number of Addenda: 0

## 2023-07-18 NOTE — Inpatient Diabetes Management (Signed)
 Inpatient Diabetes Program Recommendations  AACE/ADA: New Consensus Statement on Inpatient Glycemic Control (2015)  Target Ranges:  Prepandial:   less than 140 mg/dL      Peak postprandial:   less than 180 mg/dL (1-2 hours)      Critically ill patients:  140 - 180 mg/dL   Lab Results  Component Value Date   GLUCAP 73 07/18/2023   HGBA1C 5.8 (H) 07/18/2023    Review of Glycemic Control  Latest Reference Range & Units 07/17/23 21:38 07/18/23 01:05 07/18/23 07:45  Glucose-Capillary 70 - 99 mg/dL 563 (H) 90 73  (H): Data is abnormally high  Diabetes history: DM2 Outpatient Diabetes medications:  Omnipod with humalog and G7  [name] 0000 1.25 u/h  0400 1.40  0800 1.55  1200 1.60  Total basal is 36/day  I:C ratio--1:3  Sensitivity --20  Target--120  Current orders for Inpatient glycemic control: Insulin pump  Inpatient Diabetes Program Recommendations:    Novolog 0-9 units TID and 0-5 units at bedtime.  Will need basal insulin; current glucose trends are low.  Hold off on basal insulin until glucose is >150 mg/dL.  Will follow today.    Met with patient at bedside.  She forgot her PDM which is the device she uses to enter carbs and correct her glucose using her insulin pump.  Patient has removed her pump.  She is wearing her Dexcom G7 which is working well.  Can use the CGM for insulin dosing with an MD order.  Will need to check a CBG BID-AM and HS.    Will continue to follow while inpatient.  Thank you, Jennifer Sellar, MSN, CDCES Diabetes Coordinator Inpatient Diabetes Program [phone] (team pager from 8a-5p)

## 2023-07-18 NOTE — Assessment & Plan Note (Signed)
-   Glasgow Blatchford score  Hg  <24F   melena   syncope *   >1 Justifies admission and aggressive management      Admit to progressive given that pt is on plavix    -  ER  Provider spoke to gastroenterology ( [facility]) they will see patient in a.m. appreciate their consult   - serial CBC.    - Monitor for any recurrence,  evidence of hemodynamic instability or significant blood loss  - Transfuse 1 unit PRBCs given symptomatic anemia  - Establish at least 2 PIV and fluid resuscitate   - clear liquids for tonight keep nothing by mouth post midnight,   -  administer Protonix   twice a day

## 2023-07-18 NOTE — Assessment & Plan Note (Signed)
 In the setting of dehydration rehydrate follow fluid status

## 2023-07-18 NOTE — Assessment & Plan Note (Addendum)
 Hold Plavix in the setting of GI bleed Patient had stent placement in May 2024

## 2023-07-18 NOTE — Subjective & Objective (Signed)
 Patient has been feeling more short of breath and fatigued for the past 4 days 2 days ago she had a syncopal episode when she got up quickly from a toilet.  Denies head injury Reports dyspnea with mild exertion. Reports melena or dark stool for the past few weeks Epigastric abdominal pain Prior history of GI bleed followed by Dr. Concha Se last EGD was done in January  Patient is on Plavix history of PAD with critical leg ischemia status post Pham pop bypass No prior history of PE or DVT

## 2023-07-18 NOTE — TOC Initial Note (Signed)
 Transition of Care [facility]) - Initial/Assessment Note   Patient Details  Name: [name] MRN: [id number] Date of Birth: 09/23/1955  [facility] CM/SW Contact:    Ewing Schlein, LCSW Phone Number: 07/18/2023, 8:51 AM  Clinical Narrative: Patient is from home alone. Patient is currently on IV antibiotics. TOC following for possible discharge needs.                 Expected Discharge Plan: Home/Self Care Barriers to Discharge: Continued Medical Work up  Expected Discharge Plan and Services Living arrangements for the past 2 months: Apartment             DME Arranged: N/A DME Agency: NA  Prior Living Arrangements/Services Living arrangements for the past 2 months: Apartment Lives with:: Self Patient language and need for interpreter reviewed:: Yes Need for Family Participation in Patient Care: No (Comment) Care giver support system in place?: Yes (comment) Criminal Activity/Legal Involvement Pertinent to Current Situation/Hospitalization: No - Comment as needed  Activities of Daily Living ADL Screening (condition at time of admission) Independently performs ADLs?: No Does the patient have a NEW difficulty with bathing/dressing/toileting/self-feeding that is expected to last >3 days?: No Does the patient have a NEW difficulty with getting in/out of bed, walking, or climbing stairs that is expected to last >3 days?: No Does the patient have a NEW difficulty with communication that is expected to last >3 days?: No Is the patient deaf or have difficulty hearing?: No Does the patient have difficulty seeing, even when wearing glasses/contacts?: No Does the patient have difficulty concentrating, remembering, or making decisions?: No  Emotional Assessment Orientation: : Oriented to Self, Oriented to Place, Oriented to  Time, Oriented to Situation Alcohol / Substance Use: Not Applicable Psych Involvement: No (comment)  Admission diagnosis:  Hypokalemia [E87.6] Upper GI  bleed [K92.2] Gastrointestinal hemorrhage with melena [K92.1] Symptomatic anemia [D64.9] Syncope, unspecified syncope type [R55] Patient Active Problem List   Diagnosis Date Noted   Upper GI bleed 07/18/2023   Symptomatic anemia 07/18/2023   Allergic rhinitis due to pollen 01/03/2022   Chronic allergic conjunctivitis 01/03/2022   Seafood allergy 01/03/2022   Dyslipidemia 06/15/2021   Chronic insomnia 08/28/2020   Inadequate sleep hygiene 08/28/2020   Cough, unspecified 08/28/2020   Excessive daytime sleepiness 08/28/2020   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   Chronic intermittent hypoxia with obstructive sleep apnea 08/28/2020   Vitamin B12 deficiency anemia due to intrinsic factor deficiency 08/28/2020   Diabetes mellitus with gastroparesis (HCC) 08/28/2020   Type 2 diabetes mellitus with diabetic polyneuropathy, with long-term current use of insulin (HCC) 04/17/2020   Diabetes mellitus (HCC) 04/17/2020   Generalized abdominal pain 12/15/2019   AKI (acute kidney injury) (HCC) 07/28/2019   Hypokalemia 06/28/2019   Generalized weakness 06/28/2019   Syncope and collapse 06/20/2019   Tobacco abuse 05/04/2019   Diabetic gastroparesis associated with type 2 diabetes mellitus (HCC) 11/25/2017   Precordial chest pain 12/24/2016   Nausea & vomiting 12/24/2016   PAD (peripheral artery disease) (HCC) 06/19/2016   Diabetic neuropathy (HCC) 01/31/2016   Obesity 01/31/2016   Mild CAD    Claudication (HCC) 01/29/2016   Food allergy 08/02/2015   Recurrent urticaria 07/10/2015   Angioedema 07/10/2015   Allergic rhinitis 07/10/2015   History of asthma 07/10/2015   S/P femoral-popliteal bypass surgery 11/04/2014   Critical lower limb ischemia (HCC) 11/03/2014   Peripheral arterial disease (HCC) 11/02/2014   HNP (herniated nucleus pulposus) with myelopathy, cervical 04/11/2014  HNP (herniated nucleus pulposus), cervical 04/08/2014   Pancreatitis 01/02/2013   Abdominal pain,  epigastric 12/14/2012   IBS (irritable bowel syndrome) 09/12/2011   GERD (gastroesophageal reflux disease) 09/12/2011   Diverticulosis of colon with hemorrhage 08/10/2011   Chest pain at rest 07/06/2011    Class: Acute   Hidradenitis, left pubic area and left lower quadrant 01/18/2011   DM2 (diabetes mellitus, type 2) (HCC) 08/24/2007   Hyperlipidemia LDL goal <70 08/24/2007   Essential hypertension 08/24/2007   PCP:  Carin Hock, PA Pharmacy:   [id number] [place], [place] - [address] AT [facility] OF [facility] [address] [place] [address] Phone: [phone] Fax: [phone]  [facility] (New Address) - [place], [place] - [address] AT Previously: [place], [place] [address] [facility] [address] [place] [address] Phone: [phone] Fax: [phone]  [facility] [facility] - [facility] - [place], [place] - [address] [place] [address] Phone: [phone] Fax: [phone]  Social Drivers of Health (SDOH) Social History: SDOH Screenings   Food Insecurity: No Food Insecurity (07/18/2023)  Housing: Low Risk  (07/18/2023)  Transportation Needs: No Transportation Needs (07/18/2023)  Utilities: Not At Risk (07/18/2023)  Depression (PHQ2-9): Low Risk  (03/04/2023)  Financial Resource Strain: Low Risk  (02/06/2022)  Social Connections: Moderately Integrated (07/18/2023)  Stress: Stress Concern Present (02/17/2023)  Tobacco Use: Medium Risk (07/17/2023)  Health Literacy: Adequate Health Literacy (02/17/2023)   SDOH Interventions:    Readmission Risk Interventions     No data to display

## 2023-07-18 NOTE — Assessment & Plan Note (Signed)
 Allow permissive hypertension

## 2023-07-18 NOTE — Progress Notes (Addendum)
 68 year old female with GERD, s/p Nissen fundoplication history of UGI bleed, HTN, DM2, H/oh CVA and PVD on Plavi was admitted earlier this morning with increasing fatigue, shortness of breath and 1 episode of syncope 2 days ago as well as epigastric discomfort.  Workup revealed drop in hemoglobin from 12 in January to 7.9 on admission.  Was admitted for treatment of upper GI bleed and symptomatic anemia.   This morning patient states she feels she is a little bit stronger but still has epigastric discomfort as previously.  Hemoglobin has increased from 7.9 to 9.4 and her hemodynamic parameters are stable.  Also of note, patient's UA is markedly positive and patient states that she does have dysuria.  Ceftriaxone started.  Appreciate DM coordinator, will start SSI low-dose 0-9 as requested.  Insulin pump is being held.   Patient was transfused with 1 unit PRBC with appropriate response.  She was seen by [facility] and underwent EGD today without obvious cause of bleeding other than iatrogenic Mallory-Weiss tear.  Recommendations include CT abdomen and pelvis with contrast, might need to desensitize given possible iodine allergy.  May potentially need colonoscopy after Plavix washout.  Last dose of Plavix was 3 days ago on Tuesday.

## 2023-07-19 ENCOUNTER — Inpatient Hospital Stay (HOSPITAL_COMMUNITY)

## 2023-07-19 DIAGNOSIS — K921 Melena: Secondary | ICD-10-CM | POA: Diagnosis not present

## 2023-07-19 DIAGNOSIS — K922 Gastrointestinal hemorrhage, unspecified: Secondary | ICD-10-CM | POA: Diagnosis not present

## 2023-07-19 DIAGNOSIS — R55 Syncope and collapse: Secondary | ICD-10-CM

## 2023-07-19 LAB — ECHOCARDIOGRAM COMPLETE
AR max vel: 1.98 cm2
AV Area VTI: 2.39 cm2
AV Area mean vel: 2.07 cm2
AV Mean grad: 3 mmHg
AV Peak grad: 6 mmHg
Ao pk vel: 1.22 m/s
Area-P 1/2: 4 cm2
Calc EF: 61.8 %
Height: 64 in
MV VTI: 1.58 cm2
S' Lateral: 2.9 cm
Single Plane A2C EF: 66.6 %
Single Plane A4C EF: 60.7 %
Weight: 2016 [oz_av]

## 2023-07-19 LAB — CBC
HCT: 28.4 % — ABNORMAL LOW (ref 36.0–46.0)
HCT: 25.5 % — ABNORMAL LOW (ref 36.0–46.0)
Hemoglobin: 9.2 g/dL — ABNORMAL LOW (ref 12.0–15.0)
Hemoglobin: 8 g/dL — ABNORMAL LOW (ref 12.0–15.0)
MCH: 28.5 pg (ref 26.0–34.0)
MCH: 28.5 pg (ref 26.0–34.0)
MCHC: 32.4 g/dL (ref 30.0–36.0)
MCHC: 31.4 g/dL (ref 30.0–36.0)
MCV: 87.9 fL (ref 80.0–100.0)
MCV: 90.7 fL (ref 80.0–100.0)
Platelets: 334 10*3/uL (ref 150–400)
Platelets: 303 10*3/uL (ref 150–400)
RBC: 3.23 MIL/uL — ABNORMAL LOW (ref 3.87–5.11)
RBC: 2.81 MIL/uL — ABNORMAL LOW (ref 3.87–5.11)
RDW: 15.5 % (ref 11.5–15.5)
RDW: 15.4 % (ref 11.5–15.5)
WBC: 6.8 10*3/uL (ref 4.0–10.5)
WBC: 6.8 10*3/uL (ref 4.0–10.5)
nRBC: 1 % — ABNORMAL HIGH (ref 0.0–0.2)
nRBC: 0.9 % — ABNORMAL HIGH (ref 0.0–0.2)

## 2023-07-19 LAB — GLUCOSE, CAPILLARY
Glucose-Capillary: 110 mg/dL — ABNORMAL HIGH (ref 70–99)
Glucose-Capillary: 184 mg/dL — ABNORMAL HIGH (ref 70–99)
Glucose-Capillary: 204 mg/dL — ABNORMAL HIGH (ref 70–99)
Glucose-Capillary: 186 mg/dL — ABNORMAL HIGH (ref 70–99)

## 2023-07-19 MED ORDER — POTASSIUM CHLORIDE CRYS ER 20 MEQ PO TBCR
40.0000 meq | EXTENDED_RELEASE_TABLET | Freq: Once | ORAL | Status: AC
  Administered 2023-07-19: 40 meq via ORAL
  Filled 2023-07-19: qty 2

## 2023-07-19 NOTE — Progress Notes (Signed)
 Mobility Specialist - Progress Note   07/19/23 1437  Mobility  Activity Ambulated with assistance in hallway  Level of Assistance Standby assist, set-up cues, supervision of patient - no hands on  Assistive Device Other (Comment) (IV Pole)  Distance Ambulated (ft) 350 ft  Range of Motion/Exercises Active  Activity Response Tolerated well  Mobility Referral Yes  Mobility visit 1 Mobility  Mobility Specialist Start Time (ACUTE ONLY) 1427  Mobility Specialist Stop Time (ACUTE ONLY) 1437  Mobility Specialist Time Calculation (min) (ACUTE ONLY) 10 min   Pt was found in bed and agreeable to ambulate. No complaints with session. At EOS returned to bed with all needs met. Call bell in reach.  Billey Chang Mobility Specialist

## 2023-07-19 NOTE — Progress Notes (Signed)
 PROGRESS NOTE    [name]  GMW:[id number]  DOB: 10-Nov-1955  DOA: 07/17/2023 PCP: Carin Hock, PA Outpatient Specialists:   Hospital course:  68 year-old female with GERD, s/p Nissen fundoplication history of UGI bleed, HTN, DM2, H/oh CVA and PVD on Plavi was admitted earlier this morning with increasing fatigue, shortness of breath and 1 episode of syncope 2 days ago as well as epigastric discomfort.  Workup revealed drop in hemoglobin from 12 in January to 7.9 on admission.  Was admitted for treatment of upper GI bleed and symptomatic anemia. Patient was transfused with 1 unit PRBC with appropriate response. She was seen by [facility] and underwent EGD today without obvious cause of bleeding other than iatrogenic Mallory-Weiss tear.   Subjective:  Patient states she does feel somewhat better with somewhat increased energy.  Patient states she is hungry and is hoping she can eat.  Is not sure why she still on a liquid diet.  Notes that she has undergone her CT last night.   Objective: Vitals:   07/18/23 1516 07/18/23 2139 07/19/23 0421 07/19/23 1100  BP: (!) 116/59 139/74 123/67 124/61  Pulse: (!) 53 80 (!) 58 66  Resp: 16 16 14    Temp: 98.4 F (36.9 C) 98.2 F (36.8 C) 97.6 F (36.4 C) 98.4 F (36.9 C)  TempSrc: Oral Oral Oral Oral  SpO2:  100% 98% 100%  Weight:      Height:        Intake/Output Summary (Last 24 hours) at 07/19/2023 1627 Last data filed at 07/19/2023 0900 Gross per 24 hour  Intake 1998.83 ml  Output 1300 ml  Net 698.83 ml   Filed Weights   07/17/23 2210 07/18/23 1241  Weight: 58 kg 57.2 kg     Exam:  General:  Eyes: sclera anicteric, conjuctiva mild injection bilaterally CVS: S1-S2, regular  Respiratory:  decreased air entry bilaterally secondary to decreased inspiratory effort, rales at bases  GI: NABS, soft, NT  LE: Warm and well-perfused Neuro: A/O x 3,  grossly nonfocal.  Psych: patient is logical and coherent, judgement and  insight appear normal, mood and affect appropriate to situation.  Data Reviewed:  Basic Metabolic Panel: Recent Labs  Lab 07/17/23 2144 07/17/23 2305 07/18/23 0105 07/18/23 0811  NA 134*  --   --  137  K 2.9*  --   --  3.2*  CL 101  --   --  107  CO2 25  --   --  23  GLUCOSE 145*  --   --  72  BUN 15  --   --  13  CREATININE 1.09*  --   --  0.97  CALCIUM 9.5  --   --  8.7*  MG  --  2.6*  --  2.2  PHOS  --   --  2.8 2.4*    CBC: Recent Labs  Lab 07/18/23 0934 07/18/23 1540 07/18/23 2119 07/19/23 0358 07/19/23 1114  WBC 9.5 19.7* 12.3* 6.8 6.8  HGB 9.4* 8.8* 8.9* 9.2* 8.0*  HCT 31.4* 27.8* 28.5* 28.4* 25.5*  MCV 91.8 90.0 90.5 87.9 90.7  PLT 162 309 326 334 303     Scheduled Meds:  sodium chloride   Intravenous Once   insulin aspart  0-9 Units Subcutaneous TID WC   insulin pump   Subcutaneous Q4H   nortriptyline  10 mg Oral QHS   pantoprazole (PROTONIX) IV  40 mg Intravenous Q12H   rosuvastatin  40 mg Oral Daily  Continuous Infusions:  cefTRIAXone (ROCEPHIN)  IV 1 g (07/19/23 1207)     Assessment & Plan:   Symptomatic anemia H/o melena  Epigastric distress Patient with drop in hemoglobin today from 9.2 to 8.0 with no obvious bleeding Blood pressure is stable without bradycardia, patient not on beta-blocker CT abdomen and pelvis with contrast without evident source of bleeding Epigastric distress as resolved on pantoprazole EGD without source of bleed Plan is for colonoscopy on Monday per GI Anemia panel shows ferritin of 17 and saturation ratios at 13 which is highly suggestive of IDA However iron and TIBC are WNL, can consider starting iron with outpatient follow-up Will also send hemolysis labs including bili, reticulocyte, LDH  UTI Patient feeling better on ceftriaxone day #2  Hypokalemia Will replete and recheck  HTN Losartan/HCTZ on hold  DM2 BP under good control on low-dose SSI Will follow closely as patient begins to eat  again Patient can go back on her insulin pump at discharge  H/o CVA PVD Aspirin and Plavix are on hold pending completion of melena workup     DVT prophylaxis: SCD Code Status: Full Family Communication: None today     Studies: ECHOCARDIOGRAM COMPLETE Result Date: 07/19/2023    ECHOCARDIOGRAM REPORT   Patient Name:   [name] Date of Exam: 07/19/2023 Medical Rec #:  [id number]       Height:       64.0 in Accession #:    [id number]      Weight:       126.0 lb Date of Birth:  August 08, 1955       BSA:          1.608 m Patient Age:    67 years        BP:           123/67 mmHg Patient Gender: F               HR:           59 bpm. Exam Location:  Inpatient Procedure: 2D Echo, Cardiac Doppler and Color Doppler (Both Spectral and Color            Flow Doppler were utilized during procedure). Indications:    Syncope  History:        Patient has prior history of Echocardiogram examinations, most                 recent 06/21/2019. CAD, PAD and COPD, Signs/Symptoms:Chest Pain                 and Syncope; Risk Factors:Hypertension, Diabetes and                 Dyslipidemia.  Sonographer:    Vern Claude Referring Phys: [id number] ANASTASSIA DOUTOVA IMPRESSIONS  1. Poor ecoustic windows     . Left ventricular ejection fraction, by estimation, is 60 to 65%. The left ventricle has normal function. The left ventricle has no regional wall motion abnormalities. Left ventricular diastolic parameters are consistent with Grade II diastolic dysfunction (pseudonormalization).  2. Right ventricular systolic function is normal. The right ventricular size is normal.  3. No evidence of mitral valve regurgitation.  4. The aortic valve was not well visualized. Aortic valve regurgitation is not visualized.  5. The inferior vena cava is normal in size with greater than 50% respiratory variability, suggesting right atrial pressure of 3 mmHg. FINDINGS  Left Ventricle: Poor ecoustic windows. Left ventricular ejection fraction, by  estimation, is 60  to 65%. The left ventricle has normal function. The left ventricle has no regional wall motion abnormalities. The left ventricular internal cavity size was normal in size. There is no left ventricular hypertrophy. Left ventricular diastolic parameters are consistent with Grade II diastolic dysfunction (pseudonormalization). Right Ventricle: The right ventricular size is normal. Right ventricular systolic function is normal. Left Atrium: Left atrial size was normal in size. Right Atrium: Right atrial size was normal in size. Pericardium: There is no evidence of pericardial effusion. Mitral Valve: No evidence of mitral valve regurgitation. MV peak gradient, 4.6 mmHg. The mean mitral valve gradient is 1.0 mmHg. Tricuspid Valve: Tricuspid valve regurgitation is not demonstrated. Aortic Valve: The aortic valve was not well visualized. Aortic valve regurgitation is not visualized. Aortic valve mean gradient measures 3.0 mmHg. Aortic valve peak gradient measures 6.0 mmHg. Aortic valve area, by VTI measures 2.39 cm. Pulmonic Valve: Pulmonic valve regurgitation is not visualized. Aorta: The aortic root and ascending aorta are structurally normal, with no evidence of dilitation. Venous: The inferior vena cava is normal in size with greater than 50% respiratory variability, suggesting right atrial pressure of 3 mmHg. IAS/Shunts: The interatrial septum was not well visualized.  LEFT VENTRICLE PLAX 2D LVIDd:         4.40 cm      Diastology LVIDs:         2.90 cm      LV e' medial:    7.83 cm/s LV PW:         0.80 cm      LV E/e' medial:  14.0 LV IVS:        0.90 cm      LV e' lateral:   6.31 cm/s LVOT diam:     1.80 cm      LV E/e' lateral: 17.4 LV SV:         61 LV SV Index:   38 LVOT Area:     2.54 cm  LV Volumes (MOD) LV vol d, MOD A2C: 98.2 ml LV vol d, MOD A4C: 145.0 ml LV vol s, MOD A2C: 32.8 ml LV vol s, MOD A4C: 57.0 ml LV SV MOD A2C:     65.4 ml LV SV MOD A4C:     145.0 ml LV SV MOD BP:      74.7 ml  RIGHT VENTRICLE             IVC RV Basal diam:  3.60 cm     IVC diam: 0.90 cm RV Mid diam:    3.20 cm RV S prime:     13.70 cm/s TAPSE (M-mode): 2.3 cm LEFT ATRIUM             Index        RIGHT ATRIUM          Index LA diam:        2.40 cm 1.49 cm/m   RA Area:     9.34 cm LA Vol (A2C):   32.6 ml 20.28 ml/m  RA Volume:   20.50 ml 12.75 ml/m LA Vol (A4C):   16.5 ml 10.26 ml/m LA Biplane Vol: 23.2 ml 14.43 ml/m  AORTIC VALVE                    PULMONIC VALVE AV Area (Vmax):    1.98 cm     PV Vmax:       0.85 m/s AV Area (Vmean):   2.07 cm     PV  Peak grad:  2.9 mmHg AV Area (VTI):     2.39 cm AV Vmax:           122.00 cm/s AV Vmean:          87.100 cm/s AV VTI:            0.254 m AV Peak Grad:      6.0 mmHg AV Mean Grad:      3.0 mmHg LVOT Vmax:         94.80 cm/s LVOT Vmean:        70.800 cm/s LVOT VTI:          0.239 m LVOT/AV VTI ratio: 0.94  AORTA Ao Root diam: 3.20 cm Ao Asc diam:  3.60 cm MITRAL VALVE MV Area (PHT): 4.00 cm     SHUNTS MV Area VTI:   1.58 cm     Systemic VTI:  0.24 m MV Peak grad:  4.6 mmHg     Systemic Diam: 1.80 cm MV Mean grad:  1.0 mmHg MV Vmax:       1.07 m/s MV Vmean:      46.2 cm/s MV E velocity: 110.00 cm/s MV A velocity: 84.60 cm/s MV E/A ratio:  1.30 Halford Decamp signed by Carolan Clines Signature Date/Time: 07/19/2023/12:13:03 PM    Final    CT Angio Abd/Pel w/ and/or w/o Result Date: 07/19/2023 CLINICAL DATA:  GI bleed. EXAM: CT ANGIOGRAPHY ABDOMEN AND PELVIS WITH CONTRAST AND WITHOUT CONTRAST TECHNIQUE: Multidetector CT imaging of the abdomen and pelvis was performed using the standard protocol during bolus administration of intravenous contrast. Multiplanar reconstructed images and MIPs were obtained and reviewed to evaluate the vascular anatomy. RADIATION DOSE REDUCTION: This exam was performed according to the departmental dose-optimization program which includes automated exposure control, adjustment of the mA and/or kV according to patient size and/or use  of iterative reconstruction technique. CONTRAST:  OMNIPAQUE IOHEXOL 350 MG/ML SOLN COMPARISON:  CT of the abdomen and pelvis with contrast on 09/12/2021 FINDINGS: VASCULAR Aorta: Atherosclerosis of the abdominal aorta without aneurysm or dissection. Celiac: Normally patent.  Normally patent branch vessels. SMA: Atherosclerosis of the SMA trunk without significant stenosis. Replaced right hepatic artery off of the proximal SMA. Renals: Single right and 2 separate left renal arteries demonstrate normal patency. IMA: Patent 9 May. Inflow: Diffuse atherosclerosis of bilateral iliac arteries without high-grade obstructive disease or iliac aneurysms. Proximal Outflow: Atherosclerosis of bilateral common femoral arteries. Visualized stent in proximal left SFA. Veins: Venous phase imaging demonstrates normally patent venous structures in the abdomen and pelvis including mesenteric veins, splenic vein and portal vein without evidence of thrombus. Review of the MIP images confirms the above findings. NON-VASCULAR Lower chest: No acute abnormality.  Stable small hiatal hernia. Hepatobiliary: No focal liver abnormality is seen. Status post cholecystectomy. No biliary dilatation. Pancreas: Unremarkable. No pancreatic ductal dilatation or surrounding inflammatory changes. Spleen: Normal in size without focal abnormality. Adrenals/Urinary Tract: Adrenal glands are unremarkable. Kidneys are normal, without renal calculi, focal lesion, or hydronephrosis. Bladder is unremarkable. Stomach/Bowel: Stable clips at the level of the gastric fundus related to prior fundoplication. There is oral contrast in the small bowel which limits evaluation of the small bowel for bleeding. No evidence bowel obstruction, ileus or lesion. No evidence contrast extravasation into the lumen of the colon during arterial or venous phases of imaging. The appendix appears surgically absent. Moderate fecal material throughout the colon. No acute  inflammatory process involving bowel. No free intraperitoneal air. Lymphatic: No  enlarged abdominal or pelvic lymph nodes. Reproductive: Status post hysterectomy. No adnexal masses. Other: No abdominal wall hernia or abnormality. No abdominopelvic ascites. Musculoskeletal: No significant bony findings. Stable dense calcifications within the subcutaneous fat of the lateral right gluteal region and lateral to the right hip. IMPRESSION: 1. No visualized active GI bleed. Evaluation of bleeding at the level of the stomach and small bowel is limited due to administration of oral contrast. 2. Stable small hiatal hernia. 3. Stable dense calcifications within the subcutaneous fat of the lateral right gluteal region and lateral to the right hip. 4. Aortoiliac atherosclerosis. Electronically Signed   By: Irish Lack M.D.   On: 07/19/2023 10:12   DG Chest 2 View Result Date: 07/17/2023 CLINICAL DATA:  Shortness of breath EXAM: CHEST - 2 VIEW COMPARISON:  Chest x-ray 07/28/2019 FINDINGS: The heart size and mediastinal contours are within normal limits. Both lungs are clear. The visualized skeletal structures are unremarkable. IMPRESSION: No active cardiopulmonary disease. Electronically Signed   By: Darliss Cheney M.D.   On: 07/17/2023 23:56    Principal Problem:   Upper GI bleed Active Problems:   Chest pain at rest   Essential hypertension   Abdominal pain, epigastric   PAD (peripheral artery disease) (HCC)   Syncope and collapse   Hypokalemia   AKI (acute kidney injury) (HCC)   Diabetes mellitus (HCC)   Dyslipidemia   Symptomatic anemia     Elyana Grabski Tublu Memorie Yokoyama, [facility]  If 7PM-7AM, please contact night-coverage [email or web address]   LOS: 1 day

## 2023-07-19 NOTE — Progress Notes (Signed)
 Progress Note    ASSESSMENT AND PLAN:   Melena with syncope on ASA/Plavix (last dose 3/11). Hb dropped from 12.5 to 8.  Has been stable.  No further melena.  HD stable. -Neg SBE.  -Neg CTA after contrast desensitization  Plan: -Trend CBC -Offered her colonoscopy in AM.  She wants to wait until Monday 3/17.  Will order prep for tomorrow. -OK for heart healthy diet for now -Continue current medications -Dr. Marina Goodell taking over the service tomorrow.     SUBJECTIVE   No further melena. Denies having any abdominal pain Wants to eat   Hemoglobin has been stable.    OBJECTIVE:     Vital signs in last 24 hours: Temp:  [97.6 F (36.4 C)-98.9 F (37.2 C)] 97.6 F (36.4 C) (03/15 0421) Pulse Rate:  [53-80] 58 (03/15 0421) Resp:  [10-16] 14 (03/15 0421) BP: (114-139)/(53-74) 123/67 (03/15 0421) SpO2:  [97 %-100 %] 98 % (03/15 0421) Weight:  [57.2 kg] 57.2 kg (03/14 1241) Last BM Date : 07/15/23 General:   Alert, well-developed, in NAD EENT:  Normal hearing, non icteric sclera, conjunctive pink.  Heart:  Regular rate and rhythm; no murmur.  No lower extremity edema   Pulm: Normal respiratory effort, lungs CTA bilaterally without wheezes or crackles. Abdomen:  Soft, nondistended, nontender.  Normal bowel sounds,.       Neurologic:  Alert and  oriented x4;  grossly normal neurologically. Psych:  Pleasant, cooperative.  Normal mood and affect.   Intake/Output from previous day: 03/14 0701 - 03/15 0700 In: 1518.8 [P.O.:900; I.V.:518.8; IV Piggyback:100] Out: 1375 [Urine:1375] Intake/Output this shift: No intake/output data recorded.  Lab Results: Recent Labs    07/18/23 1540 07/18/23 2119 07/19/23 0358  WBC 19.7* 12.3* 6.8  HGB 8.8* 8.9* 9.2*  HCT 27.8* 28.5* 28.4*  PLT 309 326 334   BMET Recent Labs    07/17/23 2144 07/18/23 0811  NA 134* 137  K 2.9* 3.2*  CL 101 107  CO2 25 23  GLUCOSE 145* 72  BUN 15 13  CREATININE 1.09* 0.97  CALCIUM 9.5  8.7*   LFT Recent Labs    07/17/23 2305 07/18/23 0811  PROT 6.7 6.1*  ALBUMIN 3.5 3.3*  AST 18 24  ALT 13 15  ALKPHOS 54 57  BILITOT 1.2 1.6*  BILIDIR 0.3*  --   IBILI 0.9  --    PT/INR Recent Labs    07/17/23 2305  LABPROT 13.8  INR 1.0   Hepatitis Panel No results for input(s): "HEPBSAG", "HCVAB", "HEPAIGM", "HEPBIGM" in the last 72 hours.  CT Angio Abd/Pel w/ and/or w/o Result Date: 07/19/2023 CLINICAL DATA:  GI bleed. EXAM: CT ANGIOGRAPHY ABDOMEN AND PELVIS WITH CONTRAST AND WITHOUT CONTRAST TECHNIQUE: Multidetector CT imaging of the abdomen and pelvis was performed using the standard protocol during bolus administration of intravenous contrast. Multiplanar reconstructed images and MIPs were obtained and reviewed to evaluate the vascular anatomy. RADIATION DOSE REDUCTION: This exam was performed according to the departmental dose-optimization program which includes automated exposure control, adjustment of the mA and/or kV according to patient size and/or use of iterative reconstruction technique. CONTRAST:  OMNIPAQUE IOHEXOL 350 MG/ML SOLN COMPARISON:  CT of the abdomen and pelvis with contrast on 09/12/2021 FINDINGS: VASCULAR Aorta: Atherosclerosis of the abdominal aorta without aneurysm or dissection. Celiac: Normally patent.  Normally patent branch vessels. SMA: Atherosclerosis of the SMA trunk without significant stenosis. Replaced right hepatic artery off of the proximal SMA. Renals: Single right  and 2 separate left renal arteries demonstrate normal patency. IMA: Patent 9 May. Inflow: Diffuse atherosclerosis of bilateral iliac arteries without high-grade obstructive disease or iliac aneurysms. Proximal Outflow: Atherosclerosis of bilateral common femoral arteries. Visualized stent in proximal left SFA. Veins: Venous phase imaging demonstrates normally patent venous structures in the abdomen and pelvis including mesenteric veins, splenic vein and portal vein without evidence  of thrombus. Review of the MIP images confirms the above findings. NON-VASCULAR Lower chest: No acute abnormality.  Stable small hiatal hernia. Hepatobiliary: No focal liver abnormality is seen. Status post cholecystectomy. No biliary dilatation. Pancreas: Unremarkable. No pancreatic ductal dilatation or surrounding inflammatory changes. Spleen: Normal in size without focal abnormality. Adrenals/Urinary Tract: Adrenal glands are unremarkable. Kidneys are normal, without renal calculi, focal lesion, or hydronephrosis. Bladder is unremarkable. Stomach/Bowel: Stable clips at the level of the gastric fundus related to prior fundoplication. There is oral contrast in the small bowel which limits evaluation of the small bowel for bleeding. No evidence bowel obstruction, ileus or lesion. No evidence contrast extravasation into the lumen of the colon during arterial or venous phases of imaging. The appendix appears surgically absent. Moderate fecal material throughout the colon. No acute inflammatory process involving bowel. No free intraperitoneal air. Lymphatic: No enlarged abdominal or pelvic lymph nodes. Reproductive: Status post hysterectomy. No adnexal masses. Other: No abdominal wall hernia or abnormality. No abdominopelvic ascites. Musculoskeletal: No significant bony findings. Stable dense calcifications within the subcutaneous fat of the lateral right gluteal region and lateral to the right hip. IMPRESSION: 1. No visualized active GI bleed. Evaluation of bleeding at the level of the stomach and small bowel is limited due to administration of oral contrast. 2. Stable small hiatal hernia. 3. Stable dense calcifications within the subcutaneous fat of the lateral right gluteal region and lateral to the right hip. 4. Aortoiliac atherosclerosis. Electronically Signed   By: Irish Lack M.D.   On: 07/19/2023 10:12   DG Chest 2 View Result Date: 07/17/2023 CLINICAL DATA:  Shortness of breath EXAM: CHEST - 2 VIEW  COMPARISON:  Chest x-ray 07/28/2019 FINDINGS: The heart size and mediastinal contours are within normal limits. Both lungs are clear. The visualized skeletal structures are unremarkable. IMPRESSION: No active cardiopulmonary disease. Electronically Signed   By: Darliss Cheney M.D.   On: 07/17/2023 23:56     Principal Problem:   Upper GI bleed Active Problems:   Essential hypertension   Chest pain at rest   Abdominal pain, epigastric   PAD (peripheral artery disease) (HCC)   Syncope and collapse   Hypokalemia   AKI (acute kidney injury) (HCC)   Diabetes mellitus (HCC)   Dyslipidemia   Symptomatic anemia     LOS: 1 day     Edman Circle, MD 07/19/2023, 10:19 AM [facility] [phone]

## 2023-07-19 NOTE — Progress Notes (Signed)
  Echocardiogram 2D Echocardiogram has been performed.  Ocie Doyne RDCS 07/19/2023, 8:18 AM

## 2023-07-20 DIAGNOSIS — K922 Gastrointestinal hemorrhage, unspecified: Secondary | ICD-10-CM | POA: Diagnosis not present

## 2023-07-20 LAB — CBC
HCT: 27.4 % — ABNORMAL LOW (ref 36.0–46.0)
Hemoglobin: 8.9 g/dL — ABNORMAL LOW (ref 12.0–15.0)
MCH: 28.5 pg (ref 26.0–34.0)
MCHC: 32.5 g/dL (ref 30.0–36.0)
MCV: 87.8 fL (ref 80.0–100.0)
Platelets: 320 10*3/uL (ref 150–400)
RBC: 3.12 MIL/uL — ABNORMAL LOW (ref 3.87–5.11)
RDW: 15.4 % (ref 11.5–15.5)
WBC: 6.6 10*3/uL (ref 4.0–10.5)
nRBC: 0.9 % — ABNORMAL HIGH (ref 0.0–0.2)

## 2023-07-20 LAB — COMPREHENSIVE METABOLIC PANEL
ALT: 14 U/L (ref 0–44)
AST: 15 U/L (ref 15–41)
Albumin: 3.1 g/dL — ABNORMAL LOW (ref 3.5–5.0)
Alkaline Phosphatase: 50 U/L (ref 38–126)
Anion gap: 6 (ref 5–15)
BUN: 16 mg/dL (ref 8–23)
CO2: 23 mmol/L (ref 22–32)
Calcium: 8.6 mg/dL — ABNORMAL LOW (ref 8.9–10.3)
Chloride: 108 mmol/L (ref 98–111)
Creatinine, Ser: 1.06 mg/dL — ABNORMAL HIGH (ref 0.44–1.00)
GFR, Estimated: 57 mL/min — ABNORMAL LOW (ref >60)
Glucose, Bld: 184 mg/dL — ABNORMAL HIGH (ref 70–99)
Potassium: 4.4 mmol/L (ref 3.5–5.1)
Sodium: 137 mmol/L (ref 135–145)
Total Bilirubin: 1 mg/dL (ref 0.0–1.2)
Total Protein: 5.7 g/dL — ABNORMAL LOW (ref 6.5–8.1)

## 2023-07-20 LAB — RETICULOCYTES
Immature Retic Fract: 16.6 % — ABNORMAL HIGH (ref 2.3–15.9)
RBC.: 3.08 MIL/uL — ABNORMAL LOW (ref 3.87–5.11)
Retic Count, Absolute: 94 10*3/uL (ref 19.0–186.0)
Retic Ct Pct: 2.7 % (ref 0.4–3.1)

## 2023-07-20 LAB — LACTATE DEHYDROGENASE: LDH: 140 U/L (ref 98–192)

## 2023-07-20 LAB — GLUCOSE, CAPILLARY
Glucose-Capillary: 181 mg/dL — ABNORMAL HIGH (ref 70–99)
Glucose-Capillary: 189 mg/dL — ABNORMAL HIGH (ref 70–99)
Glucose-Capillary: 179 mg/dL — ABNORMAL HIGH (ref 70–99)
Glucose-Capillary: 166 mg/dL — ABNORMAL HIGH (ref 70–99)

## 2023-07-20 MED ORDER — BISACODYL 5 MG PO TBEC
10.0000 mg | DELAYED_RELEASE_TABLET | Freq: Once | ORAL | Status: AC
  Administered 2023-07-20: 10 mg via ORAL
  Filled 2023-07-20: qty 2

## 2023-07-20 MED ORDER — NA SULFATE-K SULFATE-MG SULF 17.5-3.13-1.6 GM/177ML PO SOLN
0.5000 | Freq: Once | ORAL | Status: AC
  Administered 2023-07-21: 177 mL via ORAL
  Filled 2023-07-20: qty 1

## 2023-07-20 MED ORDER — NA SULFATE-K SULFATE-MG SULF 17.5-3.13-1.6 GM/177ML PO SOLN
0.5000 | Freq: Once | ORAL | Status: AC
  Administered 2023-07-20: 177 mL via ORAL
  Filled 2023-07-20: qty 1

## 2023-07-20 MED ORDER — LACTATED RINGERS IV BOLUS
1000.0000 mL | Freq: Once | INTRAVENOUS | Status: AC
  Administered 2023-07-20: 1000 mL via INTRAVENOUS

## 2023-07-20 NOTE — Anesthesia Postprocedure Evaluation (Signed)
 Anesthesia Post Note  Patient: SUSIE EHRESMAN  Procedure(s) Performed: ENTEROSCOPY     Patient location during evaluation: Endoscopy Anesthesia Type: MAC Level of consciousness: oriented, awake and alert and awake Pain management: pain level controlled Vital Signs Assessment: post-procedure vital signs reviewed and stable Respiratory status: spontaneous breathing, nonlabored ventilation, respiratory function stable and patient connected to nasal cannula oxygen Cardiovascular status: blood pressure returned to baseline and stable Postop Assessment: no headache, no backache and no apparent nausea or vomiting Anesthetic complications: no   No notable events documented.  Last Vitals:    Last Pain:                 Collene Schlichter

## 2023-07-20 NOTE — Progress Notes (Signed)
 PROGRESS NOTE    EBONEY CLAYBROOK  AVW:[id number]  DOB: 1955/06/27  DOA: 07/17/2023 PCP: Carin Hock, PA Outpatient Specialists:   Hospital course:  68 year-old female with GERD, s/p Nissen fundoplication history of UGI bleed, HTN, DM2, H/oh CVA and PVD on Plavi was admitted earlier this morning with increasing fatigue, shortness of breath and 1 episode of syncope 2 days ago as well as epigastric discomfort.  Workup revealed drop in hemoglobin from 12 in January to 7.9 on admission.  Was admitted for treatment of upper GI bleed and symptomatic anemia. Patient was transfused with 1 unit PRBC with appropriate response. She was seen by [facility] and underwent EGD today without obvious cause of bleeding other than iatrogenic Mallory-Weiss tear.   Subjective:  Patient's main complaint is epigastric discomfort every time "I drink anything".  She is pleased to be eating again.  Has asked GI to postpone colonoscopy till tomorrow so she can have 1 full day of food.  Plan is for colonoscopy tomorrow per patient.  Objective: Vitals:   07/19/23 1100 07/19/23 2102 07/20/23 0410 07/20/23 1214  BP: 124/61 (!) 112/54 119/69 (!) 102/58  Pulse: 66 61 60 65  Resp:  20 16 16   Temp: 98.4 F (36.9 C) 97.8 F (36.6 C) 97.8 F (36.6 C) 98 F (36.7 C)  TempSrc: Oral Oral Oral Oral  SpO2: 100% 100% 100% 100%  Weight:      Height:       No intake or output data in the 24 hours ending 07/20/23 1706  Filed Weights   07/17/23 2210 07/18/23 1241  Weight: 58 kg 57.2 kg     Exam:  General:  Eyes: sclera anicteric, conjuctiva mild injection bilaterally CVS: S1-S2, regular  Respiratory:  decreased air entry bilaterally secondary to decreased inspiratory effort, rales at bases  GI: NABS, soft, mild tenderness to deep palpation in epigastric region, no guarding no rebound LE: Warm and well-perfused Neuro: A/O x 3,  grossly nonfocal.  Psych: patient is logical and coherent, judgement and insight  appear normal, mood and affect appropriate to situation.  Data Reviewed:  Basic Metabolic Panel: Recent Labs  Lab 07/17/23 2144 07/17/23 2305 07/18/23 0105 07/18/23 0811 07/20/23 0736  NA 134*  --   --  137 137  K 2.9*  --   --  3.2* 4.4  CL 101  --   --  107 108  CO2 25  --   --  23 23  GLUCOSE 145*  --   --  72 184*  BUN 15  --   --  13 16  CREATININE 1.09*  --   --  0.97 1.06*  CALCIUM 9.5  --   --  8.7* 8.6*  MG  --  2.6*  --  2.2  --   PHOS  --   --  2.8 2.4*  --     CBC: Recent Labs  Lab 07/18/23 1540 07/18/23 2119 07/19/23 0358 07/19/23 1114 07/20/23 0904  WBC 19.7* 12.3* 6.8 6.8 6.6  HGB 8.8* 8.9* 9.2* 8.0* 8.9*  HCT 27.8* 28.5* 28.4* 25.5* 27.4*  MCV 90.0 90.5 87.9 90.7 87.8  PLT 309 326 334 303 320     Scheduled Meds:  sodium chloride   Intravenous Once   insulin aspart  0-9 Units Subcutaneous TID WC   insulin pump   Subcutaneous Q4H   Na Sulfate-K Sulfate-Mg Sulfate concentrate  0.5 kit Oral Once   Followed by   [START ON 07/21/2023]  Na Sulfate-K Sulfate-Mg Sulfate concentrate  0.5 kit Oral Once   nortriptyline  10 mg Oral QHS   pantoprazole (PROTONIX) IV  40 mg Intravenous Q12H   rosuvastatin  40 mg Oral Daily   Continuous Infusions:  cefTRIAXone (ROCEPHIN)  IV 1 g (07/20/23 1239)   lactated ringers       Assessment & Plan:   Symptomatic anemia H/o melena  Hemoglobin stable at around 9 CT abdomen and pelvis with contrast without evident source of bleeding EGD last week without evidence source of bleed Plan is for colonoscopy tomorrow Anemia panel shows ferritin of 17 and saturation ratios at 13 which is highly suggestive of IDA However iron and TIBC are WNL, can consider starting iron with outpatient follow-up  Epigastric distress Unclear etiology, EGD negative Had initially improved with Protonix but patient states it has recurred Apparently has history of pancreatitis in 2012 however lipase and CT here are not suggestive of  pancreatitis Would avoid narcotics for treatment of this likely functional pain  UTI Patient feeling better on ceftriaxone day #3  Hypokalemia Normalized with repletion  HTN Losartan/HCTZ on hold given relatively lower blood pressures Bolus 1 L today as patient had been n.p.o. for a while, follow BP  DM2 BP under good control on low-dose SSI Will follow closely as patient begins to eat again Patient can go back on her insulin pump at discharge  H/o CVA PVD Aspirin and Plavix are on hold pending completion of melena workup     DVT prophylaxis: SCD Code Status: Full Family Communication: None today     Studies: ECHOCARDIOGRAM COMPLETE Result Date: 07/19/2023    ECHOCARDIOGRAM REPORT   Patient Name:   LEIYA KEESEY Date of Exam: 07/19/2023 Medical Rec #:  [id number]       Height:       64.0 in Accession #:    [id number]      Weight:       126.0 lb Date of Birth:  1955-06-27       BSA:          1.608 m Patient Age:    68 years        BP:           123/67 mmHg Patient Gender: F               HR:           59 bpm. Exam Location:  Inpatient Procedure: 2D Echo, Cardiac Doppler and Color Doppler (Both Spectral and Color            Flow Doppler were utilized during procedure). Indications:    Syncope  History:        Patient has prior history of Echocardiogram examinations, most                 recent 06/21/2019. CAD, PAD and COPD, Signs/Symptoms:Chest Pain                 and Syncope; Risk Factors:Hypertension, Diabetes and                 Dyslipidemia.  Sonographer:    Vern Claude Referring Phys: [id number] ANASTASSIA DOUTOVA IMPRESSIONS  1. Poor ecoustic windows     . Left ventricular ejection fraction, by estimation, is 60 to 65%. The left ventricle has normal function. The left ventricle has no regional wall motion abnormalities. Left ventricular diastolic parameters are consistent with Grade II diastolic dysfunction (pseudonormalization).  2. Right ventricular systolic  function is normal. The  right ventricular size is normal.  3. No evidence of mitral valve regurgitation.  4. The aortic valve was not well visualized. Aortic valve regurgitation is not visualized.  5. The inferior vena cava is normal in size with greater than 50% respiratory variability, suggesting right atrial pressure of 3 mmHg. FINDINGS  Left Ventricle: Poor ecoustic windows. Left ventricular ejection fraction, by estimation, is 60 to 65%. The left ventricle has normal function. The left ventricle has no regional wall motion abnormalities. The left ventricular internal cavity size was normal in size. There is no left ventricular hypertrophy. Left ventricular diastolic parameters are consistent with Grade II diastolic dysfunction (pseudonormalization). Right Ventricle: The right ventricular size is normal. Right ventricular systolic function is normal. Left Atrium: Left atrial size was normal in size. Right Atrium: Right atrial size was normal in size. Pericardium: There is no evidence of pericardial effusion. Mitral Valve: No evidence of mitral valve regurgitation. MV peak gradient, 4.6 mmHg. The mean mitral valve gradient is 1.0 mmHg. Tricuspid Valve: Tricuspid valve regurgitation is not demonstrated. Aortic Valve: The aortic valve was not well visualized. Aortic valve regurgitation is not visualized. Aortic valve mean gradient measures 3.0 mmHg. Aortic valve peak gradient measures 6.0 mmHg. Aortic valve area, by VTI measures 2.39 cm. Pulmonic Valve: Pulmonic valve regurgitation is not visualized. Aorta: The aortic root and ascending aorta are structurally normal, with no evidence of dilitation. Venous: The inferior vena cava is normal in size with greater than 50% respiratory variability, suggesting right atrial pressure of 3 mmHg. IAS/Shunts: The interatrial septum was not well visualized.  LEFT VENTRICLE PLAX 2D LVIDd:         4.40 cm      Diastology LVIDs:         2.90 cm      LV e' medial:    7.83 cm/s LV PW:         0.80 cm       LV E/e' medial:  14.0 LV IVS:        0.90 cm      LV e' lateral:   6.31 cm/s LVOT diam:     1.80 cm      LV E/e' lateral: 17.4 LV SV:         61 LV SV Index:   38 LVOT Area:     2.54 cm  LV Volumes (MOD) LV vol d, MOD A2C: 98.2 ml LV vol d, MOD A4C: 145.0 ml LV vol s, MOD A2C: 32.8 ml LV vol s, MOD A4C: 57.0 ml LV SV MOD A2C:     65.4 ml LV SV MOD A4C:     145.0 ml LV SV MOD BP:      74.7 ml RIGHT VENTRICLE             IVC RV Basal diam:  3.60 cm     IVC diam: 0.90 cm RV Mid diam:    3.20 cm RV S prime:     13.70 cm/s TAPSE (M-mode): 2.3 cm LEFT ATRIUM             Index        RIGHT ATRIUM          Index LA diam:        2.40 cm 1.49 cm/m   RA Area:     9.34 cm LA Vol (A2C):   32.6 ml 20.28 ml/m  RA Volume:   20.50 ml 12.75 ml/m LA Vol (A4C):  16.5 ml 10.26 ml/m LA Biplane Vol: 23.2 ml 14.43 ml/m  AORTIC VALVE                    PULMONIC VALVE AV Area (Vmax):    1.98 cm     PV Vmax:       0.85 m/s AV Area (Vmean):   2.07 cm     PV Peak grad:  2.9 mmHg AV Area (VTI):     2.39 cm AV Vmax:           122.00 cm/s AV Vmean:          87.100 cm/s AV VTI:            0.254 m AV Peak Grad:      6.0 mmHg AV Mean Grad:      3.0 mmHg LVOT Vmax:         94.80 cm/s LVOT Vmean:        70.800 cm/s LVOT VTI:          0.239 m LVOT/AV VTI ratio: 0.94  AORTA Ao Root diam: 3.20 cm Ao Asc diam:  3.60 cm MITRAL VALVE MV Area (PHT): 4.00 cm     SHUNTS MV Area VTI:   1.58 cm     Systemic VTI:  0.24 m MV Peak grad:  4.6 mmHg     Systemic Diam: 1.80 cm MV Mean grad:  1.0 mmHg MV Vmax:       1.07 m/s MV Vmean:      46.2 cm/s MV E velocity: 110.00 cm/s MV A velocity: 84.60 cm/s MV E/A ratio:  1.30 Halford Decamp signed by Carolan Clines Signature Date/Time: 07/19/2023/12:13:03 PM    Final    CT Angio Abd/Pel w/ and/or w/o Result Date: 07/19/2023 CLINICAL DATA:  GI bleed. EXAM: CT ANGIOGRAPHY ABDOMEN AND PELVIS WITH CONTRAST AND WITHOUT CONTRAST TECHNIQUE: Multidetector CT imaging of the abdomen and pelvis was performed  using the standard protocol during bolus administration of intravenous contrast. Multiplanar reconstructed images and MIPs were obtained and reviewed to evaluate the vascular anatomy. RADIATION DOSE REDUCTION: This exam was performed according to the departmental dose-optimization program which includes automated exposure control, adjustment of the mA and/or kV according to patient size and/or use of iterative reconstruction technique. CONTRAST:  OMNIPAQUE IOHEXOL 350 MG/ML SOLN COMPARISON:  CT of the abdomen and pelvis with contrast on 09/12/2021 FINDINGS: VASCULAR Aorta: Atherosclerosis of the abdominal aorta without aneurysm or dissection. Celiac: Normally patent.  Normally patent branch vessels. SMA: Atherosclerosis of the SMA trunk without significant stenosis. Replaced right hepatic artery off of the proximal SMA. Renals: Single right and 2 separate left renal arteries demonstrate normal patency. IMA: Patent 9 May. Inflow: Diffuse atherosclerosis of bilateral iliac arteries without high-grade obstructive disease or iliac aneurysms. Proximal Outflow: Atherosclerosis of bilateral common femoral arteries. Visualized stent in proximal left SFA. Veins: Venous phase imaging demonstrates normally patent venous structures in the abdomen and pelvis including mesenteric veins, splenic vein and portal vein without evidence of thrombus. Review of the MIP images confirms the above findings. NON-VASCULAR Lower chest: No acute abnormality.  Stable small hiatal hernia. Hepatobiliary: No focal liver abnormality is seen. Status post cholecystectomy. No biliary dilatation. Pancreas: Unremarkable. No pancreatic ductal dilatation or surrounding inflammatory changes. Spleen: Normal in size without focal abnormality. Adrenals/Urinary Tract: Adrenal glands are unremarkable. Kidneys are normal, without renal calculi, focal lesion, or hydronephrosis. Bladder is unremarkable. Stomach/Bowel: Stable clips at the level of the gastric  fundus related to prior fundoplication. There is oral contrast in the small bowel which limits evaluation of the small bowel for bleeding. No evidence bowel obstruction, ileus or lesion. No evidence contrast extravasation into the lumen of the colon during arterial or venous phases of imaging. The appendix appears surgically absent. Moderate fecal material throughout the colon. No acute inflammatory process involving bowel. No free intraperitoneal air. Lymphatic: No enlarged abdominal or pelvic lymph nodes. Reproductive: Status post hysterectomy. No adnexal masses. Other: No abdominal wall hernia or abnormality. No abdominopelvic ascites. Musculoskeletal: No significant bony findings. Stable dense calcifications within the subcutaneous fat of the lateral right gluteal region and lateral to the right hip. IMPRESSION: 1. No visualized active GI bleed. Evaluation of bleeding at the level of the stomach and small bowel is limited due to administration of oral contrast. 2. Stable small hiatal hernia. 3. Stable dense calcifications within the subcutaneous fat of the lateral right gluteal region and lateral to the right hip. 4. Aortoiliac atherosclerosis. Electronically Signed   By: Irish Lack M.D.   On: 07/19/2023 10:12    Principal Problem:   Upper GI bleed Active Problems:   Chest pain at rest   Essential hypertension   Abdominal pain, epigastric   PAD (peripheral artery disease) (HCC)   Syncope and collapse   Hypokalemia   AKI (acute kidney injury) (HCC)   Diabetes mellitus (HCC)   Dyslipidemia   Symptomatic anemia     Nekia Maxham Tublu Ayaana Biondo, [facility]  If 7PM-7AM, please contact night-coverage [email or web address]   LOS: 2 days

## 2023-07-20 NOTE — Progress Notes (Signed)
 Mobility Specialist - Progress Note   07/20/23 1613  Mobility  Activity Ambulated independently in hallway  Level of Assistance Independent  Assistive Device Other (Comment) (IV Pole)  Distance Ambulated (ft) 350 ft  Range of Motion/Exercises Active  Activity Response Tolerated well  Mobility Referral Yes  Mobility visit 1 Mobility  Mobility Specialist Start Time (ACUTE ONLY) 1600  Mobility Specialist Stop Time (ACUTE ONLY) 1613  Mobility Specialist Time Calculation (min) (ACUTE ONLY) 13 min   Pt was found in bed and agreeable to ambulate. No complaints with session. At EOS returned to EOB with all needs met. Call bell in reach.  Billey Chang Mobility Specialist

## 2023-07-21 ENCOUNTER — Encounter (HOSPITAL_COMMUNITY): Payer: Self-pay | Admitting: Internal Medicine

## 2023-07-21 ENCOUNTER — Inpatient Hospital Stay (HOSPITAL_COMMUNITY): Admitting: Anesthesiology

## 2023-07-21 ENCOUNTER — Encounter (HOSPITAL_COMMUNITY)
Admission: EM | Disposition: A | Discharge status: Home / Self Care | Payer: Self-pay | Source: Home / Self Care | Attending: Internal Medicine

## 2023-07-21 DIAGNOSIS — D125 Benign neoplasm of sigmoid colon: Secondary | ICD-10-CM | POA: Diagnosis not present

## 2023-07-21 DIAGNOSIS — K922 Gastrointestinal hemorrhage, unspecified: Secondary | ICD-10-CM | POA: Diagnosis not present

## 2023-07-21 DIAGNOSIS — K31819 Angiodysplasia of stomach and duodenum without bleeding: Secondary | ICD-10-CM

## 2023-07-21 DIAGNOSIS — K5730 Diverticulosis of large intestine without perforation or abscess without bleeding: Secondary | ICD-10-CM

## 2023-07-21 DIAGNOSIS — K635 Polyp of colon: Secondary | ICD-10-CM

## 2023-07-21 DIAGNOSIS — K5520 Angiodysplasia of colon without hemorrhage: Secondary | ICD-10-CM

## 2023-07-21 DIAGNOSIS — K921 Melena: Secondary | ICD-10-CM

## 2023-07-21 DIAGNOSIS — I2510 Atherosclerotic heart disease of native coronary artery without angina pectoris: Secondary | ICD-10-CM | POA: Diagnosis not present

## 2023-07-21 HISTORY — PX: POLYPECTOMY: SHX5525

## 2023-07-21 HISTORY — PX: COLONOSCOPY: SHX5424

## 2023-07-21 LAB — COMPREHENSIVE METABOLIC PANEL
ALT: 19 U/L (ref 0–44)
AST: 23 U/L (ref 15–41)
Albumin: 3.2 g/dL — ABNORMAL LOW (ref 3.5–5.0)
Alkaline Phosphatase: 52 U/L (ref 38–126)
Anion gap: 8 (ref 5–15)
BUN: 9 mg/dL (ref 8–23)
CO2: 21 mmol/L — ABNORMAL LOW (ref 22–32)
Calcium: 8.7 mg/dL — ABNORMAL LOW (ref 8.9–10.3)
Chloride: 112 mmol/L — ABNORMAL HIGH (ref 98–111)
Creatinine, Ser: 0.86 mg/dL (ref 0.44–1.00)
GFR, Estimated: >60 mL/min (ref >60)
Glucose, Bld: 123 mg/dL — ABNORMAL HIGH (ref 70–99)
Potassium: 3.4 mmol/L — ABNORMAL LOW (ref 3.5–5.1)
Sodium: 141 mmol/L (ref 135–145)
Total Bilirubin: 1 mg/dL (ref 0.0–1.2)
Total Protein: 6 g/dL — ABNORMAL LOW (ref 6.5–8.1)

## 2023-07-21 LAB — BPAM RBC
Blood Product Expiration Date: 202504092359
ISSUE DATE / TIME: 202503140145
Unit Type and Rh: 5100

## 2023-07-21 LAB — CBC WITH DIFFERENTIAL/PLATELET
Abs Immature Granulocytes: 0.02 10*3/uL (ref 0.00–0.07)
Basophils Absolute: 0 10*3/uL (ref 0.0–0.1)
Basophils Relative: 1 %
Eosinophils Absolute: 0.3 10*3/uL (ref 0.0–0.5)
Eosinophils Relative: 4 %
HCT: 29.2 % — ABNORMAL LOW (ref 36.0–46.0)
Hemoglobin: 9 g/dL — ABNORMAL LOW (ref 12.0–15.0)
Immature Granulocytes: 0 %
Lymphocytes Relative: 27 %
Lymphs Abs: 1.8 10*3/uL (ref 0.7–4.0)
MCH: 27.8 pg (ref 26.0–34.0)
MCHC: 30.8 g/dL (ref 30.0–36.0)
MCV: 90.1 fL (ref 80.0–100.0)
Monocytes Absolute: 0.6 10*3/uL (ref 0.1–1.0)
Monocytes Relative: 8 %
Neutro Abs: 4 10*3/uL (ref 1.7–7.7)
Neutrophils Relative %: 60 %
Platelets: 346 10*3/uL (ref 150–400)
RBC: 3.24 MIL/uL — ABNORMAL LOW (ref 3.87–5.11)
RDW: 15.4 % (ref 11.5–15.5)
WBC: 6.8 10*3/uL (ref 4.0–10.5)
nRBC: 0.4 % — ABNORMAL HIGH (ref 0.0–0.2)

## 2023-07-21 LAB — URINE CULTURE: Culture: >=100000 — AB

## 2023-07-21 LAB — TYPE AND SCREEN
ABO/RH(D): B POS
Antibody Screen: POSITIVE
Donor AG Type: NEGATIVE
Unit division: 0

## 2023-07-21 LAB — GLUCOSE, CAPILLARY
Glucose-Capillary: 165 mg/dL — ABNORMAL HIGH (ref 70–99)
Glucose-Capillary: 115 mg/dL — ABNORMAL HIGH (ref 70–99)
Glucose-Capillary: 164 mg/dL — ABNORMAL HIGH (ref 70–99)
Glucose-Capillary: 168 mg/dL — ABNORMAL HIGH (ref 70–99)

## 2023-07-21 SURGERY — COLONOSCOPY
Anesthesia: Monitor Anesthesia Care

## 2023-07-21 MED ORDER — SODIUM CHLORIDE 0.9 % IV SOLN: INTRAVENOUS | Status: DC | PRN

## 2023-07-21 MED ORDER — FAMOTIDINE 20 MG PO TABS
40.0000 mg | ORAL_TABLET | Freq: Two times a day (BID) | ORAL | Status: DC
  Administered 2023-07-21 – 2023-07-23 (×4): 40 mg via ORAL
  Filled 2023-07-21 (×4): qty 2

## 2023-07-21 MED ORDER — PROPOFOL 500 MG/50ML IV EMUL
INTRAVENOUS | Status: AC
  Filled 2023-07-21: qty 50

## 2023-07-21 MED ORDER — LIDOCAINE 2% (20 MG/ML) 5 ML SYRINGE
INTRAMUSCULAR | Status: DC | PRN
  Administered 2023-07-21: 100 mg via INTRAVENOUS

## 2023-07-21 MED ORDER — FERROUS SULFATE 325 (65 FE) MG PO TABS
325.0000 mg | ORAL_TABLET | Freq: Every day | ORAL | Status: DC
  Administered 2023-07-22 – 2023-07-23 (×2): 325 mg via ORAL
  Filled 2023-07-21 (×3): qty 1

## 2023-07-21 MED ORDER — ONDANSETRON HCL 4 MG/2ML IJ SOLN
INTRAMUSCULAR | Status: DC | PRN
  Administered 2023-07-21: 4 mg via INTRAVENOUS

## 2023-07-21 MED ORDER — PROPOFOL 500 MG/50ML IV EMUL
INTRAVENOUS | Status: DC | PRN
  Administered 2023-07-21: 155 ug/kg/min via INTRAVENOUS
  Administered 2023-07-21: 30 ug/kg/min via INTRAVENOUS

## 2023-07-21 MED ORDER — ORAL CARE MOUTH RINSE: 15.0000 mL | OROMUCOSAL | Status: DC | PRN

## 2023-07-21 NOTE — Progress Notes (Signed)
 PROGRESS NOTE    [name]  WUJ:[id number]  DOB: October 16, 1955  DOA: 07/17/2023 PCP: Carin Hock, PA Outpatient Specialists:   Hospital course:  68 year-old female with GERD, s/p Nissen fundoplication history of UGI bleed, HTN, DM2, H/oh CVA and PVD on Plavi was admitted earlier this morning with increasing fatigue, shortness of breath and 1 episode of syncope 2 days ago as well as epigastric discomfort.  Workup revealed drop in hemoglobin from 12 in January to 7.9 on admission.  Was admitted for treatment of upper GI bleed and symptomatic anemia. Patient was transfused with 1 unit PRBC with appropriate response. She was seen by [facility] and underwent EGD today without obvious cause of bleeding other than iatrogenic Mallory-Weiss tear.  Patient scheduled for colonoscopy today.  Subjective:  Patient was seen and examined at bedside today.  Patient is currently awaiting colonoscopy complained of having some mild nausea denies abdominal pain.  Patient did not have any more episodes of melena.  Objective: Vitals:   07/20/23 0410 07/20/23 1214 07/20/23 2022 07/21/23 0352  BP: 119/69 (!) 102/58 122/65 122/60  Pulse: 60 65 (!) 52 (!) 57  Resp: 16 16 17 18   Temp: 97.8 F (36.6 C) 98 F (36.7 C) 98.5 F (36.9 C) 98.3 F (36.8 C)  TempSrc: Oral Oral Oral Oral  SpO2: 100% 100% 100% 100%  Weight:      Height:        Intake/Output Summary (Last 24 hours) at 07/21/2023 1217 Last data filed at 07/21/2023 1000 Gross per 24 hour  Intake 360.06 ml  Output --  Net 360.06 ml    Filed Weights   07/17/23 2210 07/18/23 1241  Weight: 58 kg 57.2 kg     Exam:  General:  Eyes: sclera anicteric, conjuctiva mild injection bilaterally CVS: S1-S2, regular  Respiratory:  decreased air entry bilaterally secondary to decreased inspiratory effort, rales at bases  GI: NABS, soft, mild tenderness to deep palpation in epigastric region, no guarding no rebound LE: Warm and  well-perfused Neuro: A/O x 3,  grossly nonfocal.  Psych: patient is logical and coherent, judgement and insight appear normal, mood and affect appropriate to situation.  Data Reviewed:  Basic Metabolic Panel: Recent Labs  Lab 07/17/23 2144 07/17/23 2305 07/18/23 0105 07/18/23 0811 07/20/23 0736 07/21/23 1114  NA 134*  --   --  137 137 141  K 2.9*  --   --  3.2* 4.4 3.4*  CL 101  --   --  107 108 112*  CO2 25  --   --  23 23 21*  GLUCOSE 145*  --   --  72 184* 123*  BUN 15  --   --  13 16 9   CREATININE 1.09*  --   --  0.97 1.06* 0.86  CALCIUM 9.5  --   --  8.7* 8.6* 8.7*  MG  --  2.6*  --  2.2  --   --   PHOS  --   --  2.8 2.4*  --   --     CBC: Recent Labs  Lab 07/18/23 2119 07/19/23 0358 07/19/23 1114 07/20/23 0904 07/21/23 1114  WBC 12.3* 6.8 6.8 6.6 6.8  NEUTROABS  --   --   --   --  4.0  HGB 8.9* 9.2* 8.0* 8.9* 9.0*  HCT 28.5* 28.4* 25.5* 27.4* 29.2*  MCV 90.5 87.9 90.7 87.8 90.1  PLT 326 334 303 320 346     Scheduled Meds:  famotidine  40 mg Oral BID AC & HS   insulin aspart  0-9 Units Subcutaneous TID WC   nortriptyline  10 mg Oral QHS   pantoprazole (PROTONIX) IV  40 mg Intravenous Q12H   rosuvastatin  40 mg Oral Daily   Continuous Infusions:  cefTRIAXone (ROCEPHIN)  IV 1 g (07/21/23 0830)     Assessment & Plan:   Symptomatic anemia H/o melena  Hemoglobin stable at around 9. CT abdomen and pelvis with contrast without evident source of bleeding EGD last week without evidence source of bleed Plan is for colonoscopy today Anemia panel shows ferritin of 17 and saturation ratios at 13 which is highly suggestive of IDA However iron and TIBC are WNL, can consider starting iron with outpatient follow-up  Epigastric distress Unclear etiology, EGD negative Had initially improved with Protonix but patient states it has recurred Apparently has history of pancreatitis in 2012 however lipase and CT here are not suggestive of pancreatitis Would avoid  narcotics for treatment of this likely functional pain  UTI Patient feeling better on ceftriaxone day #3  Hypokalemia Current potassium is 3.4 will replenish p.o. once colonoscopy is done.  HTN Losartan/HCTZ on hold given relatively lower blood pressures Bolus 1 L today as patient had been n.p.o. for a while, follow BP  DM2 BP under good control on low-dose SSI Will follow closely as patient begins to eat again Patient can go back on her insulin pump at discharge  H/o CVA PVD Aspirin and Plavix are on hold pending completion of melena workup     DVT prophylaxis: SCD Code Status: Full Family Communication: None today     Studies: No results found.   Principal Problem:   Upper GI bleed Active Problems:   Chest pain at rest   Essential hypertension   Abdominal pain, epigastric   PAD (peripheral artery disease) (HCC)   Syncope and collapse   Hypokalemia   AKI (acute kidney injury) (HCC)   Diabetes mellitus (HCC)   Dyslipidemia   Symptomatic anemia     [name], [facility]  If 7PM-7AM, please contact night-coverage [email or web address]   LOS: 3 days

## 2023-07-21 NOTE — Anesthesia Preprocedure Evaluation (Addendum)
 Anesthesia Evaluation  Patient identified by MRN, date of birth, ID band Patient awake    Reviewed: Allergy & Precautions, NPO status , Patient's Chart, lab work & pertinent test results  Airway Mallampati: II  TM Distance: >3 FB Neck ROM: Full    Dental  (+) Upper Dentures, Lower Dentures   Pulmonary asthma , sleep apnea , former smoker   Pulmonary exam normal        Cardiovascular hypertension, + CAD and + Peripheral Vascular Disease  Normal cardiovascular exam  ECHO: 1. Poor ecoustic windows.  Left ventricular ejection fraction, by estimation, is 60 to 65%. The  left ventricle has normal function. The left ventricle has no regional  wall motion abnormalities. Left ventricular diastolic parameters are  consistent with Grade II diastolic  dysfunction (pseudonormalization).   2. Right ventricular systolic function is normal. The right ventricular  size is normal.   3. No evidence of mitral valve regurgitation.   4. The aortic valve was not well visualized. Aortic valve regurgitation  is not visualized.   5. The inferior vena cava is normal in size with greater than 50%  respiratory variability, suggesting right atrial pressure of 3 mmHg.     Neuro/Psych  Headaches  Anxiety     Left-sided weakness  Neuromuscular disease CVA, Residual Symptoms    GI/Hepatic Neg liver ROS, hiatal hernia, PUD,GERD  Medicated and Controlled,,  Endo/Other  diabetes, Insulin Dependent    Renal/GU Renal disease     Musculoskeletal  (+) Arthritis ,    Abdominal   Peds  Hematology  (+) Blood dyscrasia (Plavix), anemia   Anesthesia Other Findings GI bleed  Reproductive/Obstetrics                             Anesthesia Physical Anesthesia Plan  ASA: 3  Anesthesia Plan: MAC   Post-op Pain Management:    Induction:   PONV Risk Score and Plan: 2 and Propofol infusion and Treatment may vary due to age or  medical condition  Airway Management Planned: Nasal Cannula  Additional Equipment:   Intra-op Plan:   Post-operative Plan:   Informed Consent: I have reviewed the patients History and Physical, chart, labs and discussed the procedure including the risks, benefits and alternatives for the proposed anesthesia with the patient or authorized representative who has indicated his/her understanding and acceptance.       Plan Discussed with: CRNA  Anesthesia Plan Comments:         Anesthesia Quick Evaluation

## 2023-07-21 NOTE — Transfer of Care (Signed)
 Immediate Anesthesia Transfer of Care Note  Patient: [name]  Procedure(s) Performed: Procedure(s): COLONOSCOPY (N/A) EGD, WITH ARGON PLASMA COAGULATION (N/A) POLYPECTOMY  Patient Location: PACU  Anesthesia Type:MAC  Level of Consciousness:  sedated, patient cooperative and responds to stimulation  Airway & Oxygen Therapy:Patient Spontanous Breathing and Patient connected to face mask oxgen  Post-op Assessment:  Report given to PACU RN and Post -op Vital signs reviewed and stable  Post vital signs:  Reviewed and stable  Last Vitals:  Vitals:   07/21/23 0352 07/21/23 1234  BP: 122/60 133/65  Pulse: (!) 57 65  Resp: 18 16  Temp: 36.8 C 36.5 C  SpO2: 100% 100%    Complications: No apparent anesthesia complications

## 2023-07-21 NOTE — Progress Notes (Signed)
    Progress Note   Subjective  Chief Complaint: Melena with syncope on aspirin/Plavix (last dose 3/11)  Today, patient tells me that she is tired, this is not her first "rodeo" as far as her colonoscopy is concerned.  She is almost running clear per her was just a little bit of brown tinge to the water.  She is very thirsty and asked if she can have some ice cubes.  No abdominal pain.  She loves Dr. Leone Payor.  Does continue to complain of some epigastric pain when drinking liquids.   Objective   Vital signs in last 24 hours: Temp:  [98 F (36.7 C)-98.5 F (36.9 C)] 98.3 F (36.8 C) (03/17 0352) Pulse Rate:  [52-65] 57 (03/17 0352) Resp:  [16-18] 18 (03/17 0352) BP: (102-122)/(58-65) 122/60 (03/17 0352) SpO2:  [100 %] 100 % (03/17 0352) Last BM Date : 07/21/23 General:   AA female in NAD Heart:  Regular rate and rhythm; no murmurs Lungs: Respirations even and unlabored, lungs CTA bilaterally Abdomen:  Soft, nontender and nondistended. Normal bowel sounds. Psych:  Cooperative. Normal mood and affect.  Lab Results: Recent Labs    07/19/23 0358 07/19/23 1114 07/20/23 0904  WBC 6.8 6.8 6.6  HGB 9.2* 8.0* 8.9*  HCT 28.4* 25.5* 27.4*  PLT 334 303 320   BMET Recent Labs    07/20/23 0736  NA 137  K 4.4  CL 108  CO2 23  GLUCOSE 184*  BUN 16  CREATININE 1.06*  CALCIUM 8.6*   LFT Recent Labs    07/20/23 0736  PROT 5.7*  ALBUMIN 3.1*  AST 15  ALT 14  ALKPHOS 50  BILITOT 1.0    Assessment / Plan:   Assessment: 1.  Melena with anemia: Hemoglobin dropped from 12.5--> 8--> 8.9 overnight, initially presented with increased fatigue and shortness of breath and an episode of syncope, no further melena since admission, hemodynamically stable, negative SBE and CTA, scheduled for colonoscopy today 2.  History of CVA and PVD: on Plavix and aspirin (last dose 07/15/2023)  Plan: 1.  Colonoscopy today with Dr. Leone Payor.  Did review risks, benefits, limitations and alternatives  and the patient agrees to proceed. 2.  Continue to monitor hemoglobin with transfusion as needed less than 7 3.  Patient will be n.p.o. until after time of procedure. 4. Added Famotidine 40 mg BID tablet to start this evening given ongoing epigastric pain and burning, Continue Pantoprazole 40mg  BID. 4.  Please await any further recommendations from Dr. Leone Payor later.  Thank you for your kind consultation, we will continue to follow.   LOS: 3 days   Unk Lightning  07/21/2023, 9:28 AM

## 2023-07-21 NOTE — H&P (View-Only) (Signed)
    Progress Note   Subjective  Chief Complaint: Melena with syncope on aspirin/Plavix (last dose 3/11)  Today, patient tells me that she is tired, this is not her first "rodeo" as far as her colonoscopy is concerned.  She is almost running clear per her was just a little bit of brown tinge to the water.  She is very thirsty and asked if she can have some ice cubes.  No abdominal pain.  She loves Dr. Leone Payor.  Does continue to complain of some epigastric pain when drinking liquids.   Objective   Vital signs in last 24 hours: Temp:  [98 F (36.7 C)-98.5 F (36.9 C)] 98.3 F (36.8 C) (03/17 0352) Pulse Rate:  [52-65] 57 (03/17 0352) Resp:  [16-18] 18 (03/17 0352) BP: (102-122)/(58-65) 122/60 (03/17 0352) SpO2:  [100 %] 100 % (03/17 0352) Last BM Date : 07/21/23 General:   AA female in NAD Heart:  Regular rate and rhythm; no murmurs Lungs: Respirations even and unlabored, lungs CTA bilaterally Abdomen:  Soft, nontender and nondistended. Normal bowel sounds. Psych:  Cooperative. Normal mood and affect.  Lab Results: Recent Labs    07/19/23 0358 07/19/23 1114 07/20/23 0904  WBC 6.8 6.8 6.6  HGB 9.2* 8.0* 8.9*  HCT 28.4* 25.5* 27.4*  PLT 334 303 320   BMET Recent Labs    07/20/23 0736  NA 137  K 4.4  CL 108  CO2 23  GLUCOSE 184*  BUN 16  CREATININE 1.06*  CALCIUM 8.6*   LFT Recent Labs    07/20/23 0736  PROT 5.7*  ALBUMIN 3.1*  AST 15  ALT 14  ALKPHOS 50  BILITOT 1.0    Assessment / Plan:   Assessment: 1.  Melena with anemia: Hemoglobin dropped from 12.5--> 8--> 8.9 overnight, initially presented with increased fatigue and shortness of breath and an episode of syncope, no further melena since admission, hemodynamically stable, negative SBE and CTA, scheduled for colonoscopy today 2.  History of CVA and PVD: on Plavix and aspirin (last dose 07/15/2023)  Plan: 1.  Colonoscopy today with Dr. Leone Payor.  Did review risks, benefits, limitations and alternatives  and the patient agrees to proceed. 2.  Continue to monitor hemoglobin with transfusion as needed less than 7 3.  Patient will be n.p.o. until after time of procedure. 4. Added Famotidine 40 mg BID tablet to start this evening given ongoing epigastric pain and burning, Continue Pantoprazole 40mg  BID. 4.  Please await any further recommendations from Dr. Leone Payor later.  Thank you for your kind consultation, we will continue to follow.   LOS: 3 days   Unk Lightning  07/21/2023, 9:28 AM

## 2023-07-21 NOTE — Op Note (Signed)
 [facility] Patient Name: [name] Procedure Date: 07/21/2023 MRN: [id number] Attending MD: Iva Boop , MD, [id number] Date of Birth: 04-Jun-1955 CSN: [id number] Age: 68 Admit Type: Inpatient Procedure:                Colonoscopy Indications:              Melena Providers:                Iva Boop, MD, Doristine Mango, RN, Rozetta Nunnery, Technician Referring MD:              Medicines:                Monitored Anesthesia Care Complications:            No immediate complications. Estimated Blood Loss:     Estimated blood loss was minimal. Procedure:                Pre-Anesthesia Assessment:                           - Prior to the procedure, a History and Physical                            was performed, and patient medications and                            allergies were reviewed. The patient's tolerance of                            previous anesthesia was also reviewed. The risks                            and benefits of the procedure and the sedation                            options and risks were discussed with the patient.                            All questions were answered, and informed consent                            was obtained. Prior Anticoagulants: The patient                            last took Plavix (clopidogrel) 5 days prior to the                            procedure. ASA Grade Assessment: III - A patient                            with severe systemic disease. After reviewing the  risks and benefits, the patient was deemed in                            satisfactory condition to undergo the procedure.                           After obtaining informed consent, the colonoscope                            was passed under direct vision. Throughout the                            procedure, the patient's blood pressure, pulse, and                            oxygen saturations  were monitored continuously. The                            PCF-HQ190L ([id number]) Olympus colonoscope was                            introduced through the anus and advanced to the the                            terminal ileum, with identification of the                            appendiceal orifice and IC valve. The colonoscopy                            was performed without difficulty. The patient                            tolerated the procedure well. The quality of the                            bowel preparation was adequate. The bowel                            preparation used was SUPREP via split dose                            instruction. The ileocecal valve, appendiceal                            orifice, and rectum were photographed. Scope In: 1:31:18 PM Scope Out: 1:56:05 PM Scope Withdrawal Time: 0 hours 20 minutes 50 seconds  Total Procedure Duration: 0 hours 24 minutes 47 seconds  Findings:      The perianal and digital rectal examinations were normal.      A single small angiodysplastic lesion without bleeding was found in the       cecum. Coagulation for bleeding prevention using argon plasma was       successful. Estimated blood loss: none.      A diminutive  polyp was found in the sigmoid colon. The polyp was       sessile. The polyp was removed with a cold snare. Resection and       retrieval were complete. Verification of patient identification for the       specimen was done. Estimated blood loss was minimal.      Scattered diverticula were found in the sigmoid colon, descending colon       and ascending colon.      The terminal ileum appeared normal.      The exam was otherwise without abnormality on direct and retroflexion       views. Impression:               - A single non-bleeding colonic angiodysplastic                            lesion. Treated with argon plasma coagulation                            (APC). - Small suspected AVM ablated                            - One diminutive polyp in the sigmoid colon,                            removed with a cold snare. Resected and retrieved.                           - Diverticulosis in the sigmoid colon, in the                            descending colon and in the ascending colon.                           - The examined portion of the ileum was normal.                           - The examination was otherwise normal on direct                            and retroflexion views. Moderate Sedation:      Not Applicable - Patient had care per Anesthesia. Recommendation:           - Patient has a contact number available for                            emergencies. The signs and symptoms of potential                            delayed complications were discussed with the                            patient. Return to normal activities tomorrow.                            Written discharge instructions  were provided to the                            patient.                           - Resume previous diet.                           - Continue present medications.                           - Resume Plavix (clopidogrel) at prior dose in 5                            days. Home tomorrow if ok. If there is recurrent                            bleeding would do a capsule endoscopy of small                            bowel depending upon timing of recurrent bleeding.                           - Repeat colonoscopy is recommended. The                            colonoscopy date will be determined after pathology                            results from today's exam become available for                            review.                           - daily ferrous sulfate at discharge (will make                            stools dark) Procedure Code(s):        --- Professional ---                           [phone], 59, Colonoscopy, flexible; with control of                            bleeding, any method                            45385, Colonoscopy, flexible; with removal of                            tumor(s), polyp(s), or other lesion(s) by snare                            technique Diagnosis Code(s):        ---  Professional ---                           K55.20, Angiodysplasia of colon without hemorrhage                           D12.5, Benign neoplasm of sigmoid colon                           K92.1, Melena (includes Hematochezia)                           K57.30, Diverticulosis of large intestine without                            perforation or abscess without bleeding CPT copyright 2022 [employer]. All rights reserved. The codes documented in this report are preliminary and upon coder review may  be revised to meet current compliance requirements. Iva Boop, MD 07/21/2023 2:09:57 PM This report has been signed electronically. Number of Addenda: 0

## 2023-07-21 NOTE — Interval H&P Note (Signed)
 History and Physical Interval Note:  07/21/2023 1:04 PM  [name]  has presented today for surgery, with the diagnosis of GI bleed.  The various methods of treatment have been discussed with the patient and family. After consideration of risks, benefits and other options for treatment, the patient has consented to  Procedure(s): COLONOSCOPY (N/A) as a surgical intervention.  The patient's history has been reviewed, patient examined, no change in status, stable for surgery.  I have reviewed the patient's chart and labs.  Questions were answered to the patient's satisfaction.     Stan Head

## 2023-07-22 ENCOUNTER — Encounter (HOSPITAL_COMMUNITY): Payer: Self-pay | Admitting: Gastroenterology

## 2023-07-22 ENCOUNTER — Other Ambulatory Visit: Payer: Self-pay

## 2023-07-22 ENCOUNTER — Telehealth: Payer: Self-pay

## 2023-07-22 DIAGNOSIS — K922 Gastrointestinal hemorrhage, unspecified: Secondary | ICD-10-CM

## 2023-07-22 DIAGNOSIS — K921 Melena: Secondary | ICD-10-CM | POA: Diagnosis not present

## 2023-07-22 DIAGNOSIS — K219 Gastro-esophageal reflux disease without esophagitis: Secondary | ICD-10-CM

## 2023-07-22 DIAGNOSIS — D649 Anemia, unspecified: Secondary | ICD-10-CM | POA: Diagnosis not present

## 2023-07-22 DIAGNOSIS — K5520 Angiodysplasia of colon without hemorrhage: Secondary | ICD-10-CM | POA: Diagnosis not present

## 2023-07-22 LAB — COMPREHENSIVE METABOLIC PANEL
ALT: 18 U/L (ref 0–44)
AST: 18 U/L (ref 15–41)
Albumin: 3.4 g/dL — ABNORMAL LOW (ref 3.5–5.0)
Alkaline Phosphatase: 53 U/L (ref 38–126)
Anion gap: 8 (ref 5–15)
BUN: 11 mg/dL (ref 8–23)
CO2: 20 mmol/L — ABNORMAL LOW (ref 22–32)
Calcium: 9 mg/dL (ref 8.9–10.3)
Chloride: 112 mmol/L — ABNORMAL HIGH (ref 98–111)
Creatinine, Ser: 0.99 mg/dL (ref 0.44–1.00)
GFR, Estimated: >60 mL/min (ref >60)
Glucose, Bld: 143 mg/dL — ABNORMAL HIGH (ref 70–99)
Potassium: 3.7 mmol/L (ref 3.5–5.1)
Sodium: 140 mmol/L (ref 135–145)
Total Bilirubin: 1 mg/dL (ref 0.0–1.2)
Total Protein: 6.1 g/dL — ABNORMAL LOW (ref 6.5–8.1)

## 2023-07-22 LAB — CBC WITH DIFFERENTIAL/PLATELET
Abs Immature Granulocytes: 0.03 10*3/uL (ref 0.00–0.07)
Basophils Absolute: 0 10*3/uL (ref 0.0–0.1)
Basophils Relative: 1 %
Eosinophils Absolute: 0.4 10*3/uL (ref 0.0–0.5)
Eosinophils Relative: 5 %
HCT: 28.5 % — ABNORMAL LOW (ref 36.0–46.0)
Hemoglobin: 8.9 g/dL — ABNORMAL LOW (ref 12.0–15.0)
Immature Granulocytes: 0 %
Lymphocytes Relative: 28 %
Lymphs Abs: 2.3 10*3/uL (ref 0.7–4.0)
MCH: 27.9 pg (ref 26.0–34.0)
MCHC: 31.2 g/dL (ref 30.0–36.0)
MCV: 89.3 fL (ref 80.0–100.0)
Monocytes Absolute: 0.5 10*3/uL (ref 0.1–1.0)
Monocytes Relative: 6 %
Neutro Abs: 4.8 10*3/uL (ref 1.7–7.7)
Neutrophils Relative %: 60 %
Platelets: 348 10*3/uL (ref 150–400)
RBC: 3.19 MIL/uL — ABNORMAL LOW (ref 3.87–5.11)
RDW: 15.3 % (ref 11.5–15.5)
WBC: 7.9 10*3/uL (ref 4.0–10.5)
nRBC: 0.4 % — ABNORMAL HIGH (ref 0.0–0.2)

## 2023-07-22 LAB — GLUCOSE, CAPILLARY
Glucose-Capillary: 133 mg/dL — ABNORMAL HIGH (ref 70–99)
Glucose-Capillary: 139 mg/dL — ABNORMAL HIGH (ref 70–99)
Glucose-Capillary: 139 mg/dL — ABNORMAL HIGH (ref 70–99)
Glucose-Capillary: 154 mg/dL — ABNORMAL HIGH (ref 70–99)

## 2023-07-22 MED ORDER — SODIUM CHLORIDE 0.9 % IV SOLN: INTRAVENOUS | Status: AC | PRN

## 2023-07-22 NOTE — Telephone Encounter (Signed)
-----   Message from Farley H sent at 07/22/2023 10:02 AM EDT ----- Regarding: RE: hospital follow up Patient scheduled for 09/18/23 with Doug Sou, PA.  Patient notified. ----- Message ----- From: Loretha Stapler, RN Sent: 07/22/2023   9:53 AM EDT To: Holli Humbles Subject: RE: hospital follow up                         Neysa Bonito I  have entered the lab thank you ----- Message ----- From: Unk Lightning, PA Sent: 07/22/2023   9:25 AM EDT To: Holli Humbles; [facility] Triage Subject: hospital follow up                             Patient needs hospital follow-up for melena and anemia with me or other App in Pod C in the next 1-2 months.  Pod C nurses: Please order CBC repeat in 1 week.   Thanks-JLL

## 2023-07-22 NOTE — Progress Notes (Signed)
 Mobility Specialist - Progress Note   07/22/23 1026  Mobility  Activity Ambulated independently in hallway  Level of Assistance Independent  Assistive Device None  Distance Ambulated (ft) 350 ft  Activity Response Tolerated well  Mobility Referral Yes  Mobility visit 1 Mobility  Mobility Specialist Start Time (ACUTE ONLY) 1014  Mobility Specialist Stop Time (ACUTE ONLY) 1026  Mobility Specialist Time Calculation (min) (ACUTE ONLY) 12 min   Pt received in bed and agreeable to mobility. Prior to ambulating, pt requested assistance to the bathroom. No complaints during session. Pt to bed after session with all needs met.    [facility]

## 2023-07-22 NOTE — Anesthesia Postprocedure Evaluation (Signed)
 Anesthesia Post Note  Patient: [name]  Procedure(s) Performed: COLONOSCOPY EGD, WITH ARGON PLASMA COAGULATION POLYPECTOMY     Patient location during evaluation: Endoscopy Anesthesia Type: MAC Level of consciousness: awake Pain management: pain level controlled Vital Signs Assessment: post-procedure vital signs reviewed and stable Respiratory status: spontaneous breathing, nonlabored ventilation and respiratory function stable Cardiovascular status: blood pressure returned to baseline and stable Postop Assessment: no apparent nausea or vomiting Anesthetic complications: no   No notable events documented.  Last Vitals:  Vitals:   07/21/23 2014 07/22/23 0430  BP: 139/65 112/64  Pulse: 62 69  Resp: 18 17  Temp: 37.1 C 36.7 C  SpO2: 100% 100%    Last Pain:  Vitals:   07/22/23 0430  TempSrc: Oral  PainSc:                  Catheryn Bacon Anela Bensman

## 2023-07-22 NOTE — Progress Notes (Addendum)
 Progress Note   Subjective  Chief Complaint: Melena with syncope on Aspirin/Plavix (last dose 3/11)  Today, patient is feeling fairly well.  She denies any new complaints or concerns.  Aware of findings from colonoscopy yesterday including small AVM.  She asked that she is can be able to go home today and what time that will be as she needs to arrange a ride.  No new abdominal pain.  She is worried that she will have recurrence of the same symptoms again. Does report just a small amount of brb in her stool this morning.   Objective   Vital signs in last 24 hours: Temp:  [97.7 F (36.5 C)-98.8 F (37.1 C)] 98.1 F (36.7 C) (03/18 0430) Pulse Rate:  [53-69] 69 (03/18 0430) Resp:  [13-18] 17 (03/18 0430) BP: (105-139)/(54-66) 112/64 (03/18 0430) SpO2:  [95 %-100 %] 100 % (03/18 0430) Weight:  [57.2 kg] 57.2 kg (03/17 1234) Last BM Date : 07/21/23 General: AA female in NAD Heart:  Regular rate and rhythm; no murmurs Lungs: Respirations even and unlabored, lungs CTA bilaterally Abdomen:  Soft, nontender and nondistended. Normal bowel sounds. Psych:  Cooperative. Normal mood and affect.  Intake/Output from previous day: 03/17 0701 - 03/18 0700 In: 580.1 [P.O.:280; IV Piggyback:300.1] Out: -   Lab Results: Recent Labs    07/20/23 0904 07/21/23 1114 07/22/23 0358  WBC 6.6 6.8 7.9  HGB 8.9* 9.0* 8.9*  HCT 27.4* 29.2* 28.5*  PLT 320 346 348   BMET Recent Labs    07/20/23 0736 07/21/23 1114 07/22/23 0358  NA 137 141 140  K 4.4 3.4* 3.7  CL 108 112* 112*  CO2 23 21* 20*  GLUCOSE 184* 123* 143*  BUN 16 9 11   CREATININE 1.06* 0.86 0.99  CALCIUM 8.6* 8.7* 9.0   LFT Recent Labs    07/22/23 0358  PROT 6.1*  ALBUMIN 3.4*  AST 18  ALT 18  ALKPHOS 53  BILITOT 1.0    Assessment / Plan:   Assessment: 1.  Melena with anemia: Hemoglobin initially dropped from 12.5--> 8--> 8.9--> 8.9 overnight, initially presented with increased fatigue and shortness of breath and an  episode of syncope, no further melena since admission, hemodynamically stable, negative SPEP and CTA, colonoscopy yesterday with small AVM which was treated with APC and a polyp which was removed; consider AVM as source versus other small bowel etiology 2.  History of CVA and PVD: Typically on Plavix and Aspirin (last dose 07/15/2023), yesterday Dr. Leone Payor noted restarting Plavix in 4 days from now  Plan: 1.  Reviewed recent colonoscopy with the patient and answered her questions.  Discussed that small AVM could have led to her anemia.  Will need to keep an eye on her labs over the next couple of weeks to ensure that her hemoglobin does not drop down again.  Explained hemoglobin and her lab work to her today.  Answered questions. 2.  Patient can be on a regular diet. 3.  Per Dr. Leone Payor after time of procedure yesterday recommends restart of Plavix in 4 days 4.  Would continue Pantoprazole 40 mg twice daily and Famotidine 40 mg twice daily at discharge 5.  Will arrange for patient follow-up with [place] and labs in the next couple of weeks 6.  Patient should stay on iron supplementation at home daily.  We did discuss this.  Also discussed that it may cause constipation and black stools.  She is aware.  Thank you for your kind consultation,  we will sign off.   LOS: 4 days   Unk Lightning  07/22/2023, 9:15 AM     [facility] Attending   I have taken an interval history, reviewed the chart and examined the patient. I agree with the Advanced Practitioner's note, impression and recommendations with the following additions:  Stable and no recurrent melena.  Small AVM in cecum ablated yesterday.  Will see how she does.  If any recurrent issues with bleeding or failure to increase Hgb appropriately would do capsule endoscopy of small bowl.  Outpatient GI f/u arranged - she had been on high dose bid PPI and H2 blockers to help w/ reflux sxs pre-admit so will continue.  Signing off - home  tomorrow.  Iva Boop, MD, [facility] See Loretha Stapler on call - gastroenterology for best contact person 07/22/2023 2:44 PM

## 2023-07-22 NOTE — Progress Notes (Signed)
 PROGRESS NOTE    [name]  JYN:[id number] DOB: April 18, 1956 DOA: 07/17/2023 PCP: Carin Hock, PA   Brief Narrative:  This 68 year-old female with GERD, s/p Nissen fundoplication,  history of UGI bleed, HTN, DM2, H/o CVA and PVD on Plavix was admitted with increasing fatigue, shortness of breath and 1 episode of syncope 2 days ago as well as epigastric discomfort.  Workup revealed drop in hemoglobin from 12 in January to 7.9 on admission.  She was admitted for treatment of upper GI bleed and symptomatic anemia. Patient was transfused with 1 unit PRBC with appropriate response. She was seen by [facility] and underwent EGD without obvious cause of bleeding other than iatrogenic Mallory-Weiss tear.  He underwent colonoscopy found to have a single AVM which was fixed.   Assessment & Plan:   Principal Problem:   Upper GI bleed Active Problems:   Chest pain at rest   Essential hypertension   Abdominal pain, epigastric   PAD (peripheral artery disease) (HCC)   Syncope and collapse   Hypokalemia   AKI (acute kidney injury) (HCC)   Diabetes mellitus (HCC)   Dyslipidemia   Symptomatic anemia   Gastrointestinal hemorrhage with melena   Angiodysplasia of cecum   Benign neoplasm of sigmoid colon   Symptomatic anemia: H/o melena  Hemoglobin now stable at around 9. CT A/P with contrast without evident source of bleeding. EGD last week without evidence source of bleed. Colonoscopy showed single AVM which was fixed. Anemia panel shows ferritin of 17 and saturation ratios at 13 which is highly suggestive of IDA However iron and TIBC are WNL, can consider starting iron with outpatient follow-up. GI signed off , recommended Plavix can be resumed in 4 days.   Epigastric distress: Unclear etiology, EGD negative. Had initially improved with Protonix but patient states it has recurred Apparently has history of pancreatitis in 2012 however lipase and CT here are not suggestive of  pancreatitis. Would avoid narcotics for treatment of this likely functional pain.   UTI: Patient feeling better on ceftriaxone.  day #4   Hypokalemia: Replaced. Continue to monitor.   HTN Losartan/HCTZ on hold given relatively lower blood pressures. Resume when BP improves.   DM2: BP under good control on low-dose SSI. Will follow closely as patient begins to eat again Patient can go back on her insulin pump at discharge   H/o CVA PVD Aspirin and Plavix are on hold pending completion of melena workup. Plavix can be resumed in 4 days.   DVT prophylaxis: SCDs Code Status: Full code Family Communication: No family at bed side Disposition Plan:    Status is: Inpatient Remains inpatient appropriate because: Admitted for GI bleed.    Consultants:  Gastroenterology  Procedures: EGD and colonoscopy Antimicrobials:  Anti-infectives (From admission, onward)    Start     Dose/Rate Route Frequency Ordered Stop   07/18/23 0900  cefTRIAXone (ROCEPHIN) 1 g in sodium chloride 0.9 % 100 mL IVPB  Status:  Discontinued        1 g 200 mL/hr over 30 Minutes Intravenous Every 24 hours 07/18/23 0801 07/22/23 1044       Subjective: Patient was seen and examined at bedside.Overnight events noted.   Patient underwent colonoscopy found to have AVM which was fixed.   She reports not feeling well today,  wants to be discharged tomorrow.  Objective: Vitals:   07/21/23 1501 07/21/23 2014 07/22/23 0430 07/22/23 1227  BP: 112/65 139/65 112/64 (!) 144/69  Pulse:  62  69 65  Resp: 16 18 17 20   Temp: 98 F (36.7 C) 98.8 F (37.1 C) 98.1 F (36.7 C) 98.7 F (37.1 C)  TempSrc: Oral Oral Oral Oral  SpO2: 100% 100% 100% 100%  Weight:      Height:        Intake/Output Summary (Last 24 hours) at 07/22/2023 1344 Last data filed at 07/22/2023 1228 Gross per 24 hour  Intake 804.97 ml  Output --  Net 804.97 ml   Filed Weights   07/17/23 2210 07/18/23 1241 07/21/23 1234  Weight: 58 kg 57.2  kg 57.2 kg    Examination:  General exam: Appears calm and comfortable, not in any acute distress. Respiratory system: CTA Bilaterally. Respiratory effort normal.  RR 16 Cardiovascular system: S1 & S2 heard, RRR. No JVD, murmurs, rubs, gallops or clicks. No pedal edema. Gastrointestinal system: Abdomen is non distended, soft and non tender.  Normal bowel sounds heard. Central nervous system: Alert and oriented x 3. No focal neurological deficits. Extremities: Symmetric 5 x 5 power. Skin: No rashes, lesions or ulcers Psychiatry: Judgement and insight appear normal. Mood & affect appropriate.     Data Reviewed: I have personally reviewed following labs and imaging studies  CBC: Recent Labs  Lab 07/19/23 0358 07/19/23 1114 07/20/23 0904 07/21/23 1114 07/22/23 0358  WBC 6.8 6.8 6.6 6.8 7.9  NEUTROABS  --   --   --  4.0 4.8  HGB 9.2* 8.0* 8.9* 9.0* 8.9*  HCT 28.4* 25.5* 27.4* 29.2* 28.5*  MCV 87.9 90.7 87.8 90.1 89.3  PLT 334 303 320 346 348   Basic Metabolic Panel: Recent Labs  Lab 07/17/23 2144 07/17/23 2305 07/18/23 0105 07/18/23 0811 07/20/23 0736 07/21/23 1114 07/22/23 0358  NA 134*  --   --  137 137 141 140  K 2.9*  --   --  3.2* 4.4 3.4* 3.7  CL 101  --   --  107 108 112* 112*  CO2 25  --   --  23 23 21* 20*  GLUCOSE 145*  --   --  72 184* 123* 143*  BUN 15  --   --  13 16 9 11   CREATININE 1.09*  --   --  0.97 1.06* 0.86 0.99  CALCIUM 9.5  --   --  8.7* 8.6* 8.7* 9.0  MG  --  2.6*  --  2.2  --   --   --   PHOS  --   --  2.8 2.4*  --   --   --    GFR: Estimated Creatinine Clearance: 47 mL/min (by C-G formula based on SCr of 0.99 mg/dL). Liver Function Tests: Recent Labs  Lab 07/17/23 2305 07/18/23 0811 07/20/23 0736 07/21/23 1114 07/22/23 0358  AST 18 24 15 23 18   ALT 13 15 14 19 18   ALKPHOS 54 57 50 52 53  BILITOT 1.2 1.6* 1.0 1.0 1.0  PROT 6.7 6.1* 5.7* 6.0* 6.1*  ALBUMIN 3.5 3.3* 3.1* 3.2* 3.4*   Recent Labs  Lab 07/17/23 2305  LIPASE 27    No results for input(s): "AMMONIA" in the last 168 hours. Coagulation Profile: Recent Labs  Lab 07/17/23 2305  INR 1.0   Cardiac Enzymes: Recent Labs  Lab 07/18/23 0105  CKTOTAL 31*   BNP (last 3 results) No results for input(s): "PROBNP" in the last 8760 hours. HbA1C: No results for input(s): "HGBA1C" in the last 72 hours. CBG: Recent Labs  Lab 07/21/23 1141 07/21/23 1616 07/21/23  2140 07/22/23 0729 07/22/23 1109  GLUCAP 115* 164* 168* 133* 139*   Lipid Profile: No results for input(s): "CHOL", "HDL", "LDLCALC", "TRIG", "CHOLHDL", "LDLDIRECT" in the last 72 hours. Thyroid Function Tests: No results for input(s): "TSH", "T4TOTAL", "FREET4", "T3FREE", "THYROIDAB" in the last 72 hours. Anemia Panel: Recent Labs    07/20/23 0904  RETICCTPCT 2.7   Sepsis Labs: No results for input(s): "PROCALCITON", "LATICACIDVEN" in the last 168 hours.  Recent Results (from the past 240 hours)  Resp panel by RT-PCR (RSV, Flu A&B, Covid) Anterior Nasal Swab     Status: None   Collection Time: 07/17/23 11:10 PM   Specimen: Anterior Nasal Swab  Result Value Ref Range Status   SARS Coronavirus 2 by RT PCR NEGATIVE NEGATIVE Final    Comment: (NOTE) SARS-CoV-2 target nucleic acids are NOT DETECTED.  The SARS-CoV-2 RNA is generally detectable in upper respiratory specimens during the acute phase of infection. The lowest concentration of SARS-CoV-2 viral copies this assay can detect is 138 copies/mL. A negative result does not preclude SARS-Cov-2 infection and should not be used as the sole basis for treatment or other patient management decisions. A negative result may occur with  improper specimen collection/handling, submission of specimen other than nasopharyngeal swab, presence of viral mutation(s) within the areas targeted by this assay, and inadequate number of viral copies(<138 copies/mL). A negative result must be combined with clinical observations, patient history, and  epidemiological information. The expected result is Negative.  Fact Sheet for Patients:  BloggerCourse.com  Fact Sheet for Healthcare Providers:  SeriousBroker.it  This test is no t yet approved or cleared by the [place] FDA and  has been authorized for detection and/or diagnosis of SARS-CoV-2 by FDA under an Emergency Use Authorization (EUA). This EUA will remain  in effect (meaning this test can be used) for the duration of the COVID-19 declaration under Section 564(b)(1) of the Act, 21 U.S.C.section 360bbb-3(b)(1), unless the authorization is terminated  or revoked sooner.       Influenza A by PCR NEGATIVE NEGATIVE Final   Influenza B by PCR NEGATIVE NEGATIVE Final    Comment: (NOTE) The Xpert Xpress SARS-CoV-2/FLU/RSV plus assay is intended as an aid in the diagnosis of influenza from Nasopharyngeal swab specimens and should not be used as a sole basis for treatment. Nasal washings and aspirates are unacceptable for Xpert Xpress SARS-CoV-2/FLU/RSV testing.  Fact Sheet for Patients: BloggerCourse.com  Fact Sheet for Healthcare Providers: SeriousBroker.it  This test is not yet approved or cleared by the [place] FDA and has been authorized for detection and/or diagnosis of SARS-CoV-2 by FDA under an Emergency Use Authorization (EUA). This EUA will remain in effect (meaning this test can be used) for the duration of the COVID-19 declaration under Section 564(b)(1) of the Act, 21 U.S.C. section 360bbb-3(b)(1), unless the authorization is terminated or revoked.     Resp Syncytial Virus by PCR NEGATIVE NEGATIVE Final    Comment: (NOTE) Fact Sheet for Patients: BloggerCourse.com  Fact Sheet for Healthcare Providers: SeriousBroker.it  This test is not yet approved or cleared by the [place] FDA and has been  authorized for detection and/or diagnosis of SARS-CoV-2 by FDA under an Emergency Use Authorization (EUA). This EUA will remain in effect (meaning this test can be used) for the duration of the COVID-19 declaration under Section 564(b)(1) of the Act, 21 U.S.C. section 360bbb-3(b)(1), unless the authorization is terminated or revoked.  Performed at [facility], [address] [place],  [place] [address]   Urine Culture     Status: Abnormal   Collection Time: 07/18/23  8:11 AM   Specimen: Urine, Random  Result Value Ref Range Status   Specimen Description   Final    URINE, RANDOM Performed at [facility], [address] [place], [place] [address]    Special Requests   Final    NONE Reflexed from [id number] Performed at [facility], [address], [place], [place] [address]    Culture >=100,000 COLONIES/mL ESCHERICHIA COLI (A)  Final   Report Status 07/21/2023 FINAL  Final   Organism ID, Bacteria ESCHERICHIA COLI (A)  Final      Susceptibility   Escherichia coli - MIC*    AMPICILLIN >=32 RESISTANT Resistant     CEFAZOLIN <=4 SENSITIVE Sensitive     CEFEPIME <=0.12 SENSITIVE Sensitive     CEFTRIAXONE <=0.25 SENSITIVE Sensitive     CIPROFLOXACIN <=0.25 SENSITIVE Sensitive     GENTAMICIN <=1 SENSITIVE Sensitive     IMIPENEM <=0.25 SENSITIVE Sensitive     NITROFURANTOIN <=16 SENSITIVE Sensitive     TRIMETH/SULFA <=20 SENSITIVE Sensitive     AMPICILLIN/SULBACTAM 16 INTERMEDIATE Intermediate     PIP/TAZO <=4 SENSITIVE Sensitive ug/mL    * >=100,000 COLONIES/mL ESCHERICHIA COLI    Radiology Studies: No results found.  Scheduled Meds:  famotidine  40 mg Oral BID AC & HS   ferrous sulfate  325 mg Oral Q breakfast   insulin aspart  0-9 Units Subcutaneous TID WC   nortriptyline  10 mg Oral QHS   pantoprazole (PROTONIX) IV  40 mg Intravenous Q12H   rosuvastatin  40 mg Oral Daily   Continuous Infusions:  sodium chloride  Stopped (07/22/23 1046)     LOS: 4 days    Time spent: 50 mins    [name], MD [facility]   If 7PM-7AM, please contact night-coverage

## 2023-07-23 ENCOUNTER — Other Ambulatory Visit (HOSPITAL_COMMUNITY): Payer: Self-pay

## 2023-07-23 DIAGNOSIS — K922 Gastrointestinal hemorrhage, unspecified: Secondary | ICD-10-CM | POA: Diagnosis not present

## 2023-07-23 LAB — BASIC METABOLIC PANEL
Anion gap: 7 (ref 5–15)
BUN: 12 mg/dL (ref 8–23)
CO2: 19 mmol/L — ABNORMAL LOW (ref 22–32)
Calcium: 8.9 mg/dL (ref 8.9–10.3)
Chloride: 110 mmol/L (ref 98–111)
Creatinine, Ser: 0.76 mg/dL (ref 0.44–1.00)
GFR, Estimated: >60 mL/min (ref >60)
Glucose, Bld: 152 mg/dL — ABNORMAL HIGH (ref 70–99)
Potassium: 3.2 mmol/L — ABNORMAL LOW (ref 3.5–5.1)
Sodium: 136 mmol/L (ref 135–145)

## 2023-07-23 LAB — GLUCOSE, CAPILLARY
Glucose-Capillary: 118 mg/dL — ABNORMAL HIGH (ref 70–99)
Glucose-Capillary: 207 mg/dL — ABNORMAL HIGH (ref 70–99)

## 2023-07-23 LAB — PHOSPHORUS: Phosphorus: 2.2 mg/dL — ABNORMAL LOW (ref 2.5–4.6)

## 2023-07-23 LAB — MAGNESIUM: Magnesium: 2.1 mg/dL (ref 1.7–2.4)

## 2023-07-23 MED ORDER — PANTOPRAZOLE SODIUM 40 MG PO TBEC
40.0000 mg | DELAYED_RELEASE_TABLET | Freq: Two times a day (BID) | ORAL | 0 refills | Status: AC
  Filled 2023-07-23: qty 180, 90d supply, fill #0

## 2023-07-23 MED ORDER — FAMOTIDINE 40 MG PO TABS
40.0000 mg | ORAL_TABLET | Freq: Two times a day (BID) | ORAL | 0 refills | Status: AC
  Filled 2023-07-23: qty 60, 30d supply, fill #0

## 2023-07-23 MED ORDER — FERROUS SULFATE 325 (65 FE) MG PO TBEC
325.0000 mg | DELAYED_RELEASE_TABLET | ORAL | Status: DC
Start: 2023-07-23 — End: 2023-09-18

## 2023-07-23 MED ORDER — ONDANSETRON HCL 4 MG PO TABS
4.0000 mg | ORAL_TABLET | Freq: Four times a day (QID) | ORAL | 0 refills | Status: AC | PRN
  Filled 2023-07-23: qty 30, 8d supply, fill #0

## 2023-07-23 MED ORDER — PANTOPRAZOLE SODIUM 40 MG PO TBEC
40.0000 mg | DELAYED_RELEASE_TABLET | Freq: Two times a day (BID) | ORAL | Status: DC
  Administered 2023-07-23: 40 mg via ORAL
  Filled 2023-07-23: qty 1

## 2023-07-23 MED ORDER — K PHOS MONO-SOD PHOS DI & MONO 155-852-130 MG PO TABS
500.0000 mg | ORAL_TABLET | Freq: Two times a day (BID) | ORAL | Status: DC
  Administered 2023-07-23: 500 mg via ORAL
  Filled 2023-07-23: qty 2

## 2023-07-23 MED ORDER — POTASSIUM CHLORIDE CRYS ER 20 MEQ PO TBCR
40.0000 meq | EXTENDED_RELEASE_TABLET | Freq: Two times a day (BID) | ORAL | Status: AC
  Administered 2023-07-23: 40 meq via ORAL
  Filled 2023-07-23: qty 2

## 2023-07-23 NOTE — Care Management Important Message (Signed)
 Important Message  Patient Details IM Letter given to the Patient. Name: MIKELLA LINSLEY MRN: [id number] Date of Birth: February 21, 1956   Important Message Given:  Yes - Medicare IM     Caren Macadam 07/23/2023, 11:47 AM

## 2023-07-23 NOTE — Discharge Summary (Signed)
 Physician Discharge Summary   Patient: [name] MRN: [id number] DOB: 21-Dec-1955  Admit date:     07/17/2023  Discharge date: 07/23/23  Discharge Physician: Alberteen Sam   PCP: Carin Hock, PA     Recommendations at discharge:  Follow up with PCP Carin Hock in 1 week for anemia Janyth Pupa Tocci: Please obtain CBC within 7 days (discharge Hgb 8.9 g/dL) Please trend Hgb over next 2 months to confirm trend up Please repeat iron studies in 6-8 weeks (ferritin 17, iron sat 13%) If no specific indication, recommend stopping long-term aspirin or Plavix  Follow up with GI Dr. Leone Payor in 1 month     Discharge Diagnoses: Principal Problem:   Symptomatic anemia, likely due to iron deficiency from chronic GI bleed Active Problems:   Colonic AVM   Essential hypertension   Peripheral vascular disease   Syncope and collapse   Hypokalemia   Acute kidney injury ruled out   Diabetes mellitus   Dyslipidemia     Hospital Course: [name] with GERD status post Nissen fundoplication, history of UGI bleeding, HTN, DM, history CVA and PVD on aspirin Plavix who presented with progressive fatigue, then syncope.  In the ER was noted to have hemoglobin down to 7.9 from baseline 12.  She was transfused 1 unit, started on pantoprazole and admitted.   Syncope Due to anemia, hypokalemia  Symptomatic anemia, likely due to iron deficiency from chronic GI bleed Admitted, started on pantoprazole, GI consulted  She underwent enteroscopy and colonoscopy notable for minimal gastritis and AVM of the cecum which was ablated.  No stigmata of recent or severe bleeding.  GI recommended doubling her famotidine and pantoprazole, starting oral iron, and outpatient follow-up with serial hemoglobins.  Her Plavix can be restarted in 3 days.                [facility] Controlled Substances Registry was reviewed for this patient prior to discharge.  Consultants: GI,  Dr. 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   clopidogrel 75 MG tablet Wait to take this until: July 27, 2023 Commonly known as: Plavix Take 1 tablet (75 mg total) by mouth daily.       TAKE these medications    acetaminophen 325 MG tablet Commonly known as: TYLENOL Take 2 tablets (650 mg total) by mouth every 6 (six) hours as needed for mild pain (or Fever >/= 101).   albuterol 108 (90 Base) MCG/ACT inhaler Commonly known as:  ProAir HFA 1-2 inhalations every 4-6 hours as needed for cough or wheeze. What changed:  how much to take how to take this when to take this reasons to take this additional instructions   ALPRAZolam 1 MG tablet Commonly known as: XANAX Take 1 mg by mouth 2 (two) times daily as needed for anxiety.   Azelastine HCl 0.15 % Soln Place 2 sprays into both nostrils 2 (two) times daily. What changed:  when to take this reasons to take this   bisacodyl 5 MG EC tablet Commonly known as: DULCOLAX Take 5 mg by mouth daily as needed for moderate constipation.   Dexcom G6 Receiver Devi by Does not apply route.    Dexcom G7 Sensor Misc 1 Device by Does not apply route as directed.   doxycycline 100 MG capsule Commonly known as: VIBRAMYCIN Take 1 capsule (100 mg total) by mouth 2 (two) times daily. What changed:  when to take this reasons to take this   EPINEPHrine 0.3 mg/0.3 mL Soaj injection Commonly known as: EPI-PEN Inject 0.3 mLs (0.3 mg total) into the muscle once. What changed:  when to take this reasons to take this   estradiol 0.1 MG/GM vaginal cream Commonly known as: ESTRACE Place 1 Applicatorful vaginally daily.   famotidine 40 MG tablet Commonly known as: PEPCID Take 1 tablet (40 mg total) by mouth 2 (two) times daily. What changed:  when to take this reasons to take this   ferrous sulfate 325 (65 FE) MG EC tablet Take 1 tablet (325 mg total) by mouth every other day.   hydrocortisone 2.5 % rectal cream Commonly known as: ANUSOL-HC Place rectally daily as needed for hemorrhoids.   insulin lispro 100 UNIT/ML injection Commonly known as: HUMALOG Max daily 60 units   losartan-hydrochlorothiazide 50-12.5 MG tablet Commonly known as: Hyzaar Take 1 tablet by mouth daily.   lubiprostone 8 MCG capsule Commonly known as: AMITIZA Take 1 capsule (8 mcg total) by mouth 2 (two) times daily with a meal. What changed:  when to take this reasons to take this   Magnesium 200 MG Tabs Take 1 tablet (200 mg total) by mouth daily. What changed: when to take this   montelukast 10 MG tablet Commonly known as: SINGULAIR Take 10 mg by mouth at bedtime as needed (allergies).   nitroGLYCERIN 0.4 MG SL tablet Commonly known as: NITROSTAT PLACE 1 TAB UNDER TONGUE EVERY 5 MINS AS NEEDED FOR CHEST PAIN - MAX 3 DOSES THEN 911   nortriptyline 10 MG capsule Commonly known as: PAMELOR Take 1 capsule (10 mg total) by mouth at bedtime. What changed:  when to take this reasons to take this   Omnipod 5 G7 Intro (Gen 5) Kit 1 Device by Does not apply route every other day.    Omnipod 5 G7 Pods (Gen 5) Misc 1 Device by Does not apply route every other day.   ondansetron 4 MG tablet Commonly known as: ZOFRAN Take 1 tablet (4 mg total) by mouth every 6 (six) hours as needed for nausea.   pantoprazole 40 MG tablet Commonly known as: PROTONIX Take 1 tablet (40 mg total) by mouth 2 (two) times daily before a meal. What changed: when to take this   polyethylene glycol 17 g packet Commonly known as: MIRALAX / GLYCOLAX Take 17 g by mouth daily as needed for moderate constipation or severe constipation.   potassium chloride 10 MEQ tablet Commonly known as: KLOR-CON Take 10 mEq by mouth 2 (two) times daily.  rosuvastatin 40 MG tablet Commonly known as: CRESTOR Take 1 tablet (40 mg total) by mouth daily.   SUMAtriptan 100 MG tablet Commonly known as: IMITREX Take 100 mg by mouth every 2 (two) hours as needed for migraine.   terconazole 0.8 % vaginal cream Commonly known as: TERAZOL 3 Insert 1 applicatorful every day by vaginal route at bedtime for 3 days.   zolpidem 5 MG tablet Commonly known as: AMBIEN Take 5 mg by mouth at bedtime as needed for sleep.        Follow-up Information     Carin Hock, [place]. Schedule an appointment as soon as possible for a visit in 1 week(s).   Specialty: Physician Assistant Why: get labs drawn next Tuesday or Weds Contact information: [address] [place] [address] [phone]         Iva Boop, MD Follow up.   Specialty: Gastroenterology Contact information: [address] [place] [address] [phone]                 Discharge Instructions     Diet - low sodium heart healthy   Complete by: As directed    Discharge instructions   Complete by: As directed    **IMPORTANT DISCHARGE INSTRUCTIONS**   From Dr. Maryfrances Bunnell: You were admitted for weakness and passing out.  Here, we found that this was from anemia  You were given a blood transfusion  You  had a CT scan that showed no active internal bleeding  You had an enteroscopy (evaluation of esophagus and stomach) that showed a small Mallory-weiss tear.  These heal quickly, require no specific treatment.  You also had a colonoscopy, that showed an AVM, which was ablated/treated.  You should DOUBLE your pantoprazole/Protonix dose and famotidine/Pepcid dose for at least 1 month  Go see Dr. Leone Payor as directed and ask him when you should reduce to lower dose of Protonix and Pepcid  If you need more than 1 month, you will need refills from Dr. Leone Payor or your primary doctor  Go see your primary doctor as scheduled  MAKE SURE they draw a hemoglobin level next Tuesday or Wednesday  You should HOLD your aspirin and Plavix for 3 more days, you may resume on Sunday   Increase activity slowly   Complete by: As directed        Discharge Exam: Filed Weights   07/17/23 2210 07/18/23 1241 07/21/23 1234  Weight: 58 kg 57.2 kg 57.2 kg    General: Pt is alert, awake, not in acute distress Cardiovascular: RRR, nl S1-S2, no murmurs appreciated.   No LE edema.   Respiratory: Normal respiratory rate and rhythm.  CTAB without rales or wheezes. Abdominal: Abdomen soft and non-tender.  No distension or HSM.   Neuro/Psych: Strength symmetric in upper and lower extremities.  Judgment and insight appear normal.   Condition at discharge: good  The results of significant diagnostics from this hospitalization (including imaging, microbiology, ancillary and laboratory) are listed below for reference.   Imaging Studies: ECHOCARDIOGRAM COMPLETE Result Date: 07/19/2023    ECHOCARDIOGRAM REPORT   Patient Name:   Corissa N Ra Date of Exam: 07/19/2023 Medical Rec #:  [id number]       Height:       64.0 in Accession #:    [id number]      Weight:       12 6.0 lb Date of Birth:  05-05-1956       BSA:  1.608 m Patient Age:    67 years        BP:           123/67 mmHg Patient Gender: F               HR:            59 bpm. Exam Location:  Inpatient Procedure: 2D Echo, Cardiac Doppler and Color Doppler (Both Spectral and Color            Flow Doppler were utilized during procedure). Indications:    Syncope  History:        Patient has prior history of Echocardiogram examinations, most                 recent 06/21/2019. CAD, PAD and COPD, Signs/Symptoms:Chest Pain                 and Syncope; Risk Factors:Hypertension, Diabetes and                 Dyslipidemia.  Sonographer:    Vern Claude Referring Phys: [id number] ANASTASSIA DOUTOVA IMPRESSIONS  1. Poor ecoustic windows     . Left ventricular ejection fraction, by estimation, is 60 to 65%. The left ventricle has normal function. The left ventricle has no regional wall motion abnormalities. Left ventricular diastolic parameters are consistent with Grade II diastolic dysfunction (pseudonormalization).  2. Right ventricular systolic function is normal. The right ventricular size is normal.  3. No evidence of mitral valve regurgitation.  4. The aortic valve was not well visualized. Aortic valve regurgitation is not visualized.  5. The inferior vena cava is normal in size with greater than 50% respiratory variability, suggesting right atrial pressure of 3 mmHg. FINDINGS  Left Ventricle: Poor ecoustic windows. Left ventricular ejection fraction, by estimation, is 60 to 65%. The left ventricle has normal function. The left ventricle has no regional wall motion abnormalities. The left ventricular internal cavity size was normal in size. There is no left ventricular hypertrophy. Left ventricular diastolic parameters are consistent with Grade II diastolic dysfunction (pseudonormalization). Right Ventricle: The right ventricular size is normal. Right ventricular systolic function is normal. Left Atrium: Left atrial size was normal in size. Right Atrium: Right atrial size was normal in size. Pericardium: There is no evidence of pericardial effusion. Mitral Valve: No evidence of mitral  valve regurgitation. MV peak gradient, 4.6 mmHg. The mean mitral valve gradient is 1.0 mmHg. Tricuspid Valve: Tricuspid valve regurgitation is not demonstrated. Aortic Valve: The aortic valve was not well visualized. Aortic valve regurgitation is not visualized. Aortic valve mean gradient measures 3.0 mmHg. Aortic valve peak gradient measures 6.0 mmHg. Aortic valve area, by VTI measures 2.39 cm. Pulmonic Valve: Pulmonic valve regurgitation is not visualized. Aorta: The aortic root and ascending aorta are structurally normal, with no evidence of dilitation. Venous: The inferior vena cava is normal in size with greater than 50% respiratory variability, suggesting right atrial pressure of 3 mmHg. IAS/Shunts: The interatrial septum was not well visualized.  LEFT VENTRICLE PLAX 2D LVIDd:         4.40 cm      Diastology LVIDs:         2.90 cm      LV e' medial:    7.83 cm/s LV PW:         0.80 cm      LV E/e' medial:  14.0 LV IVS:        0.90 cm  LV e' lateral:   6.31 cm/s LVOT diam:     1.80 cm      LV E/e' lateral: 17.4 LV SV:         61 LV SV Index:   38 LVOT Area:     2.54 cm  LV Volumes (MOD) LV vol d, MOD A2C: 98.2 ml LV vol d, MOD A4C: 145.0 ml LV vol s, MOD A2C: 32.8 ml LV vol s, MOD A4C: 57.0 ml LV SV MOD A2C:     65.4 ml LV SV MOD A4C:     145.0 ml LV SV MOD BP:      74.7 ml RIGHT VENTRICLE             IVC RV Basal diam:  3.60 cm     IVC diam: 0.90 cm RV Mid diam:    3.20 cm RV S prime:     13.70 cm/s TAPSE (M-mode): 2.3 cm LEFT ATRIUM             Index        RIGHT ATRIUM          Index LA diam:        2.40 cm 1.49 cm/m   RA Area:     9.34 cm LA Vol (A2C):   32.6 ml 20.28 ml/m  RA Volume:   20.50 ml 12.75 ml/m LA Vol (A4C):   16.5 ml 10.26 ml/m LA Biplane Vol: 23.2 ml 14.43 ml/m  AORTIC VALVE                    PULMONIC VALVE AV Area (Vmax):    1.98 cm     PV Vmax:       0.85 m/s AV Area (Vmean):   2.07 cm     PV Peak grad:  2.9 mmHg AV Area (VTI):     2.39 cm AV Vmax:           122.00 cm/s AV  Vmean:          87.100 cm/s AV VTI:            0.254 m AV Peak Grad:      6.0 mmHg AV Mean Grad:      3.0 mmHg LVOT Vmax:         94.80 cm/s LVOT Vmean:        70.800 cm/s LVOT VTI:          0.239 m LVOT/AV VTI ratio: 0.94  AORTA Ao Root diam: 3.20 cm Ao Asc diam:  3.60 cm MITRAL VALVE MV Area (PHT): 4.00 cm     SHUNTS MV Area VTI:   1.58 cm     Systemic VTI:  0.24 m MV Peak grad:  4.6 mmHg     Systemic Diam: 1.80 cm MV Mean grad:  1.0 mmHg MV Vmax:       1.07 m/s MV Vmean:      46.2 cm/s MV E velocity: 110.00 cm/s MV A velocity: 84.60 cm/s MV E/A ratio:  1.30 Halford Decamp signed by Carolan Clines Signature Date/Time: 07/19/2023/12:13:03 PM    Final    CT Angio Abd/Pel w/ and/or w/o Result Date: 07/19/2023 CLINICAL DATA:  GI bleed. EXAM: CT ANGIOGRAPHY ABDOMEN AND PELVIS WITH CONTRAST AND WITHOUT CONTRAST TECHNIQUE: Multidetector CT imaging of the abdomen and pelvis was performed using the standard protocol during bolus administration of intravenous contrast. Multiplanar reconstructed images and MIPs were obtained and reviewed to evaluate the vascular  anatomy. RADIATION DOSE REDUCTION: This exam was performed according to the departmental dose-optimization program which includes automated exposure control, adjustment of the mA and/or kV according to patient size and/or use of iterative reconstruction technique. CONTRAST:  OMNIPAQUE IOHEXOL 350 MG/ML SOLN COMPARISON:  CT of the abdomen and pelvis with contrast on 09/12/2021 FINDINGS: VASCULAR Aorta: Atherosclerosis of the abdominal aorta without aneurysm or dissection. Celiac: Normally patent.  Normally patent branch vessels. SMA: Atherosclerosis of the SMA trunk without significant stenosis. Replaced right hepatic artery off of the proximal SMA. Renals: Single right and 2 separate left renal arteries demonstrate normal patency. IMA: Patent 9 May. Inflow: Diffuse atherosclerosis of bilateral iliac arteries without high-grade obstructive disease  or iliac aneurysms. Proximal Outflow: Atherosclerosis of bilateral common femoral arteries. Visualized stent in proximal left SFA. Veins: Venous phase imaging demonstrates normally patent venous structures in the abdomen and pelvis including mesenteric veins, splenic vein and portal vein without evidence of thrombus. Review of the MIP images confirms the above findings. NON-VASCULAR Lower chest: No acute abnormality.  Stable small hiatal hernia. Hepatobiliary: No focal liver abnormality is seen. Status post cholecystectomy. No biliary dilatation. Pancreas: Unremarkable. No pancreatic ductal dilatation or surrounding inflammatory changes. Spleen: Normal in size without focal abnormality. Adrenals/Urinary Tract: Adrenal glands are unremarkable. Kidneys are normal, without renal calculi, focal lesion, or hydronephrosis. Bladder is unremarkable. Stomach/Bowel: Stable clips at the level of the gastric fundus related to prior fundoplication. There is oral contrast in the small bowel which limits evaluation of the small bowel for bleeding. No evidence bowel obstruction, ileus or lesion. No evidence contrast extravasation into the lumen of the colon during arterial or venous phases of imaging. The appendix appears surgically absent. Moderate fecal material throughout the colon. No acute inflammatory process involving bowel. No free intraperitoneal air. Lymphatic: No enlarged abdominal or pelvic lymph nodes. Reproductive: Status post hysterectomy. No adnexal masses. Other: No abdominal wall hernia or abnormality. No abdominopelvic ascites. Musculoskeletal: No significant bony findings. Stable dense calcifications within the subcutaneous fat of the lateral right gluteal region and lateral to the right hip. IMPRESSION: 1. No visualized active GI bleed. Evaluation of bleeding at the level of the stomach and small bowel is limited due to administration of oral contrast. 2. Stable small hiatal hernia. 3. Stable dense  calcifications within the subcutaneous fat of the lateral right gluteal region and lateral to the right hip. 4. Aortoiliac atherosclerosis. Electronically Signed   By: Irish Lack M.D.   On: 07/19/2023 10:12   DG Chest 2 View Result Date: 07/17/2023 CLINICAL DATA:  Shortness of breath EXAM: CHEST - 2 VIEW COMPARISON:  Chest x-ray 07/28/2019 FINDINGS: The heart size and mediastinal contours are within normal limits. Both lungs are clear. The visualized skeletal structures are unremarkable. IMPRESSION: No active cardiopulmonary disease. Electronically Signed   By: Darliss Cheney M.D.   On: 07/17/2023 23:56    Microbiology: Results for orders placed or performed during the hospital encounter of 07/17/23  Resp panel by RT-PCR (RSV, Flu A&B, Covid) Anterior Nasal Swab     Status: None   Collection Time: 07/17/23 11:10 PM   Specimen: Anterior Nasal Swab  Result Value Ref Range Status   SARS Coronavirus 2 by RT PCR NEGATIVE NEGATIVE Final    Comment: (NOTE) SARS-CoV-2 target nucleic acids are NOT DETECTED.  The SARS-CoV-2 RNA is generally detectable in upper respiratory specimens during the acute phase of infection. The lowest concentration of SARS-CoV-2 viral copies this assay can detect is 138 copies/mL.  A negative result does not preclude SARS-Cov-2 infection and should not be used as the sole basis for treatment or other patient management decisions. A negative result may occur with  improper specimen collection/handling, submission of specimen other than nasopharyngeal swab, presence of viral mutation(s) within the areas targeted by this assay, and inadequate number of viral copies(<138 copies/mL). A negative result must be combined with clinical observations, patient history, and epidemiological information. The expected result is Negative.  Fact Sheet for Patients:  BloggerCourse.com  Fact Sheet for Healthcare Providers:   SeriousBroker.it  This test is no t yet approved or cleared by the [place] FDA and  has been authorized for detection and/or diagnosis of SARS-CoV-2 by FDA under an Emergency Use Authorization (EUA). This EUA will remain  in effect (meaning this test can be used) for the duration of the COVID-19 declaration under Section 564(b)(1) of the Act, 21 U.S.C.section 360bbb-3(b)(1), unless the authorization is terminated  or revoked sooner.       Influenza A by PCR NEGATIVE NEGATIVE Final   Influenza B by PCR NEGATIVE NEGATIVE Final    Comment: (NOTE) The Xpert Xpress SARS-CoV-2/FLU/RSV plus assay is intended as an aid in the diagnosis of influenza from Nasopharyngeal swab specimens and should not be used as a sole basis for treatment. Nasal washings and aspirates are unacceptable for Xpert Xpress SARS-CoV-2/FLU/RSV testing.  Fact Sheet for Patients: BloggerCourse.com  Fact Sheet for Healthcare Providers: SeriousBroker.it  This test is not yet approved or cleared by the [place] FDA and has been authorized for detection and/or diagnosis of SARS-CoV-2 by FDA under an Emergency Use Authorization (EUA). This EUA will remain in effect (meaning this test can be used) for the duration of the COVID-19 declaration under Section 564(b)(1) of the Act, 21 U.S.C. section 360bbb-3(b)(1), unless the authorization is terminated or revoked.     Resp Syncytial Virus by PCR NEGATIVE NEGATIVE Final    Comment: (NOTE) Fact Sheet for Patients: BloggerCourse.com  Fact Sheet for Healthcare Providers: SeriousBroker.it  This test is not yet approved or cleared by the [place] FDA and has been authorized for detection and/or diagnosis of SARS-CoV-2 by FDA under an Emergency Use Authorization (EUA). This EUA will remain in effect (meaning this test can be used) for  the duration of the COVID-19 declaration under Section 564(b)(1) of the Act, 21 U.S.C. section 360bbb-3(b)(1), unless the authorization is terminated or revoked.  Performed at [facility], [address] [place], [place] [address]   Urine Culture     Status: Abnormal   Collection Time: 07/18/23  8:11 AM   Specimen: Urine, Random  Result Value Ref Range Status   Specimen Description   Final    URINE, RANDOM Performed at [facility], [address], [place], [place] [address]    Special Requests   Final    NONE Reflexed from [id number] Performed at [facility], [address] [place], [place] [address]    Culture >=100,000 COLONIES/mL ESCHERICHIA COLI (A)  Final   Report Status 07/21/2023 FINAL  Final   Organism ID, Bacteria ESCHERICHIA COLI (A)  Final      Susceptibility   Escherichia coli - MIC*    AMPICILLIN >=32 RESISTANT Resistant     CEFAZOLIN <=4 SENSITIVE Sensitive     CEFEPIME <=0.12 SENSITIVE Sensitive     CEFTRIAXONE <=0.25 SENSITIVE Sensitive     CIPROFLOXACIN <=0.25 SENSITIVE Sensitive     GENTAMICIN <=1 SENSITIVE Sensitive  IMIPENEM <=0.25 SENSITIVE Sensitive     NITROFURANTOIN <=16 SENSITIVE Sensitive     TRIMETH/SULFA <=20 SENSITIVE Sensitive     AMPICILLIN/SULBACTAM 16 INTERMEDIATE Intermediate     PIP/TAZO <=4 SENSITIVE Sensitive ug/mL    * >=100,000 COLONIES/mL ESCHERICHIA COLI    Labs: CBC: Recent Labs  Lab 07/19/23 0358 07/19/23 1114 07/20/23 0904 07/21/23 1114 07/22/23 0358  WBC 6.8 6.8 6.6 6.8 7.9  NEUTROABS  --   --   --  4.0 4.8  HGB 9.2* 8.0* 8.9* 9.0* 8.9*  HCT 28.4* 25.5* 27.4* 29.2* 28.5*  MCV 87.9 90.7 87.8 90.1 89.3  PLT 334 303 320 346 348   Basic Metabolic Panel: Recent Labs  Lab 07/17/23 2305 07/18/23 0105 07/18/23 0811 07/20/23 0736 07/21/23 1114 07/22/23 0358 07/23/23 0411  NA  --   --  137 137 141 140 136  K  --   --  3.2* 4.4 3.4* 3.7 3.2*  CL  --   --   107 108 112* 112* 110  CO2  --   --  23 23 21* 20* 19*  GLUCOSE  --   --  72 184* 123* 143* 152*  BUN  --   --  13 16 9 11 12   CREATININE  --   --  0.97 1.06* 0.86 0.99 0.76  CALCIUM  --   --  8.7* 8.6* 8.7* 9.0 8.9  MG 2.6*  --  2.2  --   --   --  2.1  PHOS  --  2.8 2.4*  --   --   --  2.2*   Liver Function Tests: Recent Labs  Lab 07/17/23 2305 07/18/23 0811 07/20/23 0736 07/21/23 1114 07/22/23 0358  AST 18 24 15 23 18   ALT 13 15 14 19 18   ALKPHOS 54 57 50 52 53  BILITOT 1.2 1.6* 1.0 1.0 1.0  PROT 6.7 6.1* 5.7* 6.0* 6.1*  ALBUMIN 3.5 3.3* 3.1* 3.2* 3.4*   CBG: Recent Labs  Lab 07/22/23 1109 07/22/23 1609 07/22/23 2128 07/23/23 0739 07/23/23 1110  GLUCAP 139* 139* 154* 118* 207*    Discharge time spent: approximately 45 minutes spent on discharge counseling, evaluation of patient on day of discharge, and coordination of discharge planning with nursing, social work, pharmacy and case management  Signed: Alberteen Sam, MD [facility] 07/23/2023

## 2023-07-23 NOTE — Progress Notes (Signed)
 Discharge instructions reviewed with patient. All questions answered. All belongings accounted for. Patient to follow up with MD in  1 weeks. Patient medications hand delivered from outpatient pharmacy. PIV removed. Patient waiting for ride home, currently eating lunch.

## 2023-07-23 NOTE — Plan of Care (Signed)
  Problem: Education: Goal: Knowledge of General Education information will improve Description: Including pain rating scale, medication(s)/side effects and non-pharmacologic comfort measures Outcome: Adequate for Discharge   Problem: Health Behavior/Discharge Planning: Goal: Ability to manage health-related needs will improve Outcome: Adequate for Discharge   Problem: Clinical Measurements: Goal: Ability to maintain clinical measurements within normal limits will improve Outcome: Adequate for Discharge Goal: Will remain free from infection Outcome: Adequate for Discharge Goal: Diagnostic test results will improve Outcome: Adequate for Discharge Goal: Respiratory complications will improve Outcome: Adequate for Discharge Goal: Cardiovascular complication will be avoided Outcome: Adequate for Discharge   Problem: Activity: Goal: Risk for activity intolerance will decrease Outcome: Adequate for Discharge   Problem: Nutrition: Goal: Adequate nutrition will be maintained Outcome: Adequate for Discharge   Problem: Coping: Goal: Level of anxiety will decrease Outcome: Adequate for Discharge   Problem: Elimination: Goal: Will not experience complications related to bowel motility Outcome: Adequate for Discharge Goal: Will not experience complications related to urinary retention Outcome: Adequate for Discharge   Problem: Pain Managment: Goal: General experience of comfort will improve and/or be controlled Outcome: Adequate for Discharge   Problem: Safety: Goal: Ability to remain free from injury will improve Outcome: Adequate for Discharge   Problem: Skin Integrity: Goal: Risk for impaired skin integrity will decrease Outcome: Adequate for Discharge   Problem: Education: Goal: Ability to describe self-care measures that may prevent or decrease complications (Diabetes Survival Skills Education) will improve Outcome: Adequate for Discharge   Problem: Coping: Goal:  Ability to adjust to condition or change in health will improve Outcome: Adequate for Discharge   Problem: Fluid Volume: Goal: Ability to maintain a balanced intake and output will improve Outcome: Adequate for Discharge   Problem: Health Behavior/Discharge Planning: Goal: Ability to identify and utilize available resources and services will improve Outcome: Adequate for Discharge Goal: Ability to manage health-related needs will improve Outcome: Adequate for Discharge   Problem: Metabolic: Goal: Ability to maintain appropriate glucose levels will improve Outcome: Adequate for Discharge   Problem: Nutritional: Goal: Maintenance of adequate nutrition will improve Outcome: Adequate for Discharge Goal: Progress toward achieving an optimal weight will improve Outcome: Adequate for Discharge   Problem: Skin Integrity: Goal: Risk for impaired skin integrity will decrease Outcome: Adequate for Discharge   Problem: Tissue Perfusion: Goal: Adequacy of tissue perfusion will improve Outcome: Adequate for Discharge   Problem: Education: Goal: Ability to identify signs and symptoms of gastrointestinal bleeding will improve Outcome: Adequate for Discharge   Problem: Bowel/Gastric: Goal: Will show no signs and symptoms of gastrointestinal bleeding Outcome: Adequate for Discharge   Problem: Fluid Volume: Goal: Will show no signs and symptoms of excessive bleeding Outcome: Adequate for Discharge   Problem: Clinical Measurements: Goal: Complications related to the disease process, condition or treatment will be avoided or minimized Outcome: Adequate for Discharge

## 2023-07-24 ENCOUNTER — Encounter (HOSPITAL_COMMUNITY): Payer: Self-pay | Admitting: Internal Medicine

## 2023-07-24 LAB — SURGICAL PATHOLOGY

## 2023-07-28 DIAGNOSIS — D500 Iron deficiency anemia secondary to blood loss (chronic): Secondary | ICD-10-CM | POA: Diagnosis not present

## 2023-07-28 DIAGNOSIS — K5520 Angiodysplasia of colon without hemorrhage: Secondary | ICD-10-CM | POA: Diagnosis not present

## 2023-07-28 DIAGNOSIS — E876 Hypokalemia: Secondary | ICD-10-CM | POA: Diagnosis not present

## 2023-07-28 DIAGNOSIS — K219 Gastro-esophageal reflux disease without esophagitis: Secondary | ICD-10-CM | POA: Diagnosis not present

## 2023-07-29 ENCOUNTER — Encounter: Payer: Self-pay | Admitting: Internal Medicine

## 2023-08-01 NOTE — Patient Instructions (Addendum)
 Visit Information  Thank you for taking time to visit with me today. Please don't hesitate to contact me if I can be of assistance to you.   Following are the goals we discussed today:   Goals Addressed             This Visit's Progress    managing worsening GYN symptoms, be seen by a GYN (RN CM)   Not on track     Patient will continue to perform well at work and speak with leadership about her concerns- 06/23/23 resolved voiced improvements Patient will manage medical diagnoses as ordered _ 06/23/23 continue to manage at home  Patient will seek services/resources for depression from care coordination or resource via her job -06/23/23 Not discussed Interventions Today    Flowsheet Row Most Recent Value  Chronic Disease   Chronic disease during today's visit Other  [vaginal odor, assist with finding in network OBGYN]  General Interventions   General Interventions Discussed/Reviewed General Interventions Reviewed, Health Screening, Doctor Visits, Communication with  Doctor Visits Discussed/Reviewed Doctor Visits Reviewed, Specialist  Health Screening --  [gyn exam]  PCP/Specialist Visits Compliance with follow-up visit  Communication with PCP/Specialists  Education Interventions   Education Provided Provided Education  [importance of GYN exams no matter what age for abnormal symptoms Provided names of GYN providers Calls to offices with patient to obtain an appointment]              Our next appointment is by telephone on 07/11/23 at 2 pm  Please call the care guide team at [phone] if you need to cancel or reschedule your appointment.   If you are experiencing a Mental Health or Behavioral Health Crisis or need someone to talk to, please call the Suicide and Crisis Lifeline: 988 call the [place] National Suicide Prevention Lifeline: 7025670948 or TTY: (972)423-8704 TTY 773-526-7884) to talk to a trained counselor call 1-800-273-TALK (toll free, 24 hour hotline) go to  [facility] [address] [place] [phone]) call 911   Patient verbalizes understanding of instructions and care plan provided today and agrees to view in MyChart. Active MyChart status and patient understanding of how to access instructions and care plan via MyChart confirmed with patient.     The patient has been provided with contact information for the care management team and has been advised to call with any health related questions or concerns.   [name] L. Noelle Penner, RN, BSN, CCM [place]  [facility] RN Care Manager Direct Dial: [phone]  Fax: [phone]

## 2023-08-11 DIAGNOSIS — R110 Nausea: Secondary | ICD-10-CM | POA: Diagnosis not present

## 2023-08-11 DIAGNOSIS — D500 Iron deficiency anemia secondary to blood loss (chronic): Secondary | ICD-10-CM | POA: Diagnosis not present

## 2023-08-11 DIAGNOSIS — G4700 Insomnia, unspecified: Secondary | ICD-10-CM | POA: Diagnosis not present

## 2023-08-11 DIAGNOSIS — R5383 Other fatigue: Secondary | ICD-10-CM | POA: Diagnosis not present

## 2023-08-11 DIAGNOSIS — K5520 Angiodysplasia of colon without hemorrhage: Secondary | ICD-10-CM | POA: Diagnosis not present

## 2023-08-16 DIAGNOSIS — Z794 Long term (current) use of insulin: Secondary | ICD-10-CM | POA: Diagnosis not present

## 2023-08-16 DIAGNOSIS — E1149 Type 2 diabetes mellitus with other diabetic neurological complication: Secondary | ICD-10-CM | POA: Diagnosis not present

## 2023-08-16 DIAGNOSIS — E1165 Type 2 diabetes mellitus with hyperglycemia: Secondary | ICD-10-CM | POA: Diagnosis not present

## 2023-08-25 DIAGNOSIS — D500 Iron deficiency anemia secondary to blood loss (chronic): Secondary | ICD-10-CM | POA: Diagnosis not present

## 2023-09-08 DIAGNOSIS — D500 Iron deficiency anemia secondary to blood loss (chronic): Secondary | ICD-10-CM | POA: Diagnosis not present

## 2023-09-08 DIAGNOSIS — K5520 Angiodysplasia of colon without hemorrhage: Secondary | ICD-10-CM | POA: Diagnosis not present

## 2023-09-09 DIAGNOSIS — E1149 Type 2 diabetes mellitus with other diabetic neurological complication: Secondary | ICD-10-CM | POA: Diagnosis not present

## 2023-09-09 DIAGNOSIS — Z794 Long term (current) use of insulin: Secondary | ICD-10-CM | POA: Diagnosis not present

## 2023-09-09 DIAGNOSIS — E1165 Type 2 diabetes mellitus with hyperglycemia: Secondary | ICD-10-CM | POA: Diagnosis not present

## 2023-09-17 ENCOUNTER — Ambulatory Visit: Admitting: Gastroenterology

## 2023-09-18 ENCOUNTER — Encounter: Payer: Self-pay | Admitting: Gastroenterology

## 2023-09-18 ENCOUNTER — Ambulatory Visit (INDEPENDENT_AMBULATORY_CARE_PROVIDER_SITE_OTHER): Admitting: Gastroenterology

## 2023-09-18 VITALS — BP 120/70 | HR 60 | Ht 64.0 in | Wt 129.0 lb

## 2023-09-18 DIAGNOSIS — K581 Irritable bowel syndrome with constipation: Secondary | ICD-10-CM

## 2023-09-18 DIAGNOSIS — K5900 Constipation, unspecified: Secondary | ICD-10-CM

## 2023-09-18 DIAGNOSIS — Z8673 Personal history of transient ischemic attack (TIA), and cerebral infarction without residual deficits: Secondary | ICD-10-CM

## 2023-09-18 DIAGNOSIS — Z7902 Long term (current) use of antithrombotics/antiplatelets: Secondary | ICD-10-CM

## 2023-09-18 DIAGNOSIS — D649 Anemia, unspecified: Secondary | ICD-10-CM

## 2023-09-18 DIAGNOSIS — K6289 Other specified diseases of anus and rectum: Secondary | ICD-10-CM | POA: Diagnosis not present

## 2023-09-18 DIAGNOSIS — K921 Melena: Secondary | ICD-10-CM

## 2023-09-18 DIAGNOSIS — L290 Pruritus ani: Secondary | ICD-10-CM | POA: Insufficient documentation

## 2023-09-18 MED ORDER — LINACLOTIDE 145 MCG PO CAPS: 145.0000 ug | ORAL_CAPSULE | Freq: Every day | ORAL | Status: AC

## 2023-09-18 MED ORDER — HYDROCORTISONE (PERIANAL) 2.5 % EX CREA: TOPICAL_CREAM | Freq: Every day | CUTANEOUS | 5 refills | Status: AC | PRN

## 2023-09-18 NOTE — Patient Instructions (Addendum)
 We have sent the following medications to your pharmacy for you to pick up at your convenience: Hydrocortisone cream as needed.   We have given you samples of the following medication to take: Linzess  145 mcg daily before breakfast.   _______________________________________________________  If your blood pressure at your visit was 140/90 or greater, please contact your primary care physician to follow up on this.  _______________________________________________________  If you are age 80 or older, your body mass index should be between 23-30. Your Body mass index is 22.14 kg/m. If this is out of the aforementioned range listed, please consider follow up with your Primary Care Provider.  If you are age 40 or younger, your body mass index should be between 19-25. Your Body mass index is 22.14 kg/m. If this is out of the aformentioned range listed, please consider follow up with your Primary Care Provider.   ________________________________________________________  The [facility] providers would like to encourage you to use MYCHART to communicate with providers for non-urgent requests or questions.  Due to long hold times on the telephone, sending your provider a message by [facility] may be a faster and more efficient way to get a response.  Please allow 48 business hours for a response.  Please remember that this is for non-urgent requests.  _______________________________________________________

## 2023-09-18 NOTE — Progress Notes (Signed)
 09/18/2023 [name] [id number] 07-Apr-1956   HISTORY OF PRESENT ILLNESS:  This is a 68 year old female with a past medical history of anxiety, asthma, hypertension, nonobstructive CAD per cardiac cath 2018 and remote MI, peripheral arterial disease s/p L SFA stent 10/2014 and s/p left fem-pop bypass with graft 06/2016 with occlusion s/p thrombectomy with restenting 10/01/2022, CVA x 3, DM type II, gastroparesis, ovarian cancer, diverticulosis, IBS, presumed diverticular bleed 08/2011, GERD s/p Nissen Fundoplication 1996 and UGI bleed/melena 06/2019. Past cholecystectomy.    She was recently hospitalized in March for symptomatic anemia with melenic stools.  Hemoglobin from 12.5 g down to 8 g.  Symptomatic, episode of syncope.  CTA was negative.  Colonoscopy showed a small AVM which was treated with APC and a polyp which was removed.  Hemoglobin stabilized at 8.9 g and she was discharged home.  She had a repeat hemoglobin performed by her PCP on 5/5 and it was 11.2 g.  She is taking daily iron supplement, but iron studies were normal on 5/5.  She does have IBS with chronic constipation and says that the iron supplement has made that worse.  She tried several things over-the-counter.  Previously Amitiza  8 mcg twice daily worked for her, but then she had to stop it for a while as she switched over and was having loose stools.  She is asking if we have anything for samples for her to try.  Says that because of the constipation her hemorrhoids and perianal area have all so become irritated.  Is asking for refill on her hydrocortisone cream.   GI PROCEDURES:  Colonoscopy 07/2023:  - A single non- bleeding colonic angiodysplastic lesion. Treated with argon plasma coagulation ( APC) . - Small suspected AVM ablated - One diminutive polyp in the sigmoid colon, removed with a cold snare. Resected and retrieved. - Diverticulosis in the sigmoid colon, in the descending colon and in the ascending colon. - The  examined portion of the ileum was normal. - The examination was otherwise normal on direct and retroflexion views.  Polyp was a tubular adenoma.   EGD 06/05/2023:  - Z-line irregular, 35 cm from the incisors.  - 2 cm hiatal hernia. Two areas of focal erythema as described on 2021 exam also.  - A Nissen fundoplication was found. The wrap appears loose.  - The examination was otherwise normal.  - No specimens collected.   EGD 07/01/2019 as an inpatient due to melena by Dr. Homero Luster:  - Normal esophagus.  - Z-line irregular, 35 cm from the incisors.  - 2 cm hiatal hernia with focal erythema to mucosa below GEJ.  - A Nissen fundoplication was found. The wrap appears loose.  - Normal duodenal bulb, second portion of the duodenum and area of the papilla.  - No specimens collected.    Colonoscopy 07/02/2019:  - One 5 mm polyp at the hepatic flexure, removed with a cold snare. Resected and  retrieved.  - Diverticulosis in the sigmoid colon, in the descending colon and in the transverse colon.    Colonoscopy 03/02/2020:  - Three 2 to 4 mm polyps in the proximal transverse colon and at the hepatic flexure, removed  with a cold snare. Resected and retrieved.  - Mild pancolonic diverticulosis.  - Non-bleeding internal hemorrhoids.  - The examined portion of the ileum was normal.  - The examination was otherwise normal on direct and retroflexion views. No masses.  - 5 year colonoscopy recall  - TUBULAR  ADENOMA(S)  - NEGATIVE FOR HIGH-GRADE DYSPLASIA OR MALIGNANCY    Small bowel capsule endoscopy 08/13/2011: Few scattered erosions otherwise normal study   EGD 08/12/2011 done as an inpatient secondary to GIB: Schatzki's ring Prior fundoplication   Colonoscopy 08/12/2011 done as an inpatient secondary to GIB: Diverticulosis in the transverse colon, descending and sigmoid colon   Past Medical History:  Diagnosis Date   Allergic urticaria 07/10/2015   Allergy     Anemia    hx   Angioedema  07/10/2015   Anxiety    Arthritis    "neck, left hand" (09/14/2012)   Asthma    CAD (coronary artery disease)    MI 2003 and 2013   Cancer (HCC) 1985   ovarian, no treatment except surgery   Cataract    Complication of anesthesia    OCCASIONAL TROUBLE TURNING NECK TO RIGHT   Critical lower limb ischemia (HCC)    10/2014 s/p L SFA stenting   Diverticulosis of colon with hemorrhage 08/2011   GERD (gastroesophageal reflux disease)    GI bleed    H/O hiatal hernia    Hidradenitis    groin   History of blood transfusion 1985 AND 2013   Hyperlipidemia    Hypertension    Irritable bowel syndrome    Left-sided weakness    since stroke, left eye trouble seeing   Migraines    Neuropathy    Obesity    Osteoporosis    PAD (peripheral artery disease) (HCC)    a. critical limb ischemia s/p PTA/stenting of L SFA 10/2014. c. occ prior SFA stent by angio 01/2016, for possible PV bypass.   Pneumonia    baby   Recurrent upper respiratory infection (URI)    S/P arterial stent-mid Lt SFA 11/03/14 11/04/2014   Schatzki's ring    Sinus problem    Stomach ulcer 1972   non-bleeding   Stroke (HCC) 07-2007, 07-2008, 07-2009   total 3 strokes; mild left sided weakness and left eye "jumps".   Type II diabetes mellitus (HCC)    Vitamin B 12 deficiency 07/15/2013   Past Surgical History:  Procedure Laterality Date   ABDOMINAL AORTOGRAM W/LOWER EXTREMITY Left 10/01/2022   Procedure: ABDOMINAL AORTOGRAM W/LOWER EXTREMITY;  Surgeon: Margherita Shell, MD;  Location: [facility];  Service: Cardiovascular;  Laterality: Left;   ABDOMINAL HYSTERECTOMY  1985   ADENOIDECTOMY     ANTERIOR CERVICAL DECOMP/DISCECTOMY FUSION  2002   ANTERIOR CERVICAL DECOMP/DISCECTOMY FUSION N/A 04/08/2014   Procedure: Cervical Six-Seven ANTERIOR CERVICAL DECOMPRESSION/DISCECTOMY FUSION Plating and Bonegraft  2 LEVELS;  Surgeon: Audie Bleacher, MD;  Location: [facility];  Service: Neurosurgery;  Laterality: N/A;  Cervical  Six-Seven ANTERIOR CERVICAL DECOMPRESSION/DISCECTOMY FUSION Plating and Bonegraft  2 LEVELS   APPENDECTOMY  1985   AXILLARY HIDRADENITIS EXCISION  1990-2008   bilateral   BACK SURGERY     BREAST BIOPSY Right 2007   BREAST CYST EXCISION Right 2008   BREAST REDUCTION SURGERY     CARDIAC CATHETERIZATION  2004   mild disease   CATARACT EXTRACTION W/PHACO Left 05/16/2015   Procedure: CATARACT EXTRACTION PHACO AND INTRAOCULAR LENS PLACEMENT (IOC);  Surgeon: Albert Huff, MD;  Location: [facility];  Service: Ophthalmology;  Laterality: Left;  CDE: 4.24   CATARACT EXTRACTION W/PHACO Right 05/30/2015   Procedure: CATARACT EXTRACTION RIGHT EYE PHACO AND INTRAOCULAR LENS PLACEMENT ;  Surgeon: Albert Huff, MD;  Location: [facility];  Service: Ophthalmology;  Laterality: Right;  CDE:4.08   CHOLECYSTECTOMY  1990's  COLONOSCOPY  08/12/2011   Procedure: COLONOSCOPY;  Surgeon: Asencion Blacksmith, MD,FACG;  Location: [facility] ENDOSCOPY;  Service: Endoscopy;  Laterality: N/A;   COLONOSCOPY  06/19/2006   COLONOSCOPY N/A 07/21/2023   Procedure: COLONOSCOPY;  Surgeon: Kenney Peacemaker, MD;  Location: [facility] ENDOSCOPY;  Service: Gastroenterology;  Laterality: N/A;   COLONOSCOPY  07/21/2023   Procedure: COLONOSCOPY, WITH ARGON PLASMA COAGULATION;  Surgeon: Kenney Peacemaker, MD;  Location: [facility] ENDOS[facility];  Service: Gastroenterology;;   COLONOSCOPY WITH PROPOFOL  N/A 07/02/2019   Procedure: COLONOSCOPY WITH PROPOFOL ;  Surgeon: Ruby Corporal, MD;  Location: [facility];  Service: Endoscopy;  Laterality: N/A;   cyst thigh Right    ENTEROSCOPY N/A 07/18/2023   Procedure: ENTEROSCOPY;  Surgeon: Lajuan Pila, MD;  Location: [facility];  Service: Gastroenterology;  Laterality: N/A;  ultraslim scope   ESOPHAGOGASTRODUODENOSCOPY  08/12/2011   Procedure: ESOPHAGOGASTRODUODENOSCOPY (EGD);  Surgeon: Asencion Blacksmith, MD,FACG;  Location[facility];  Service: Endoscopy;  Laterality: N/A;   ESOPHAGOGASTRODUODENOSCOPY  06/04/2005    ESOPHAGOGASTRODUODENOSCOPY (EGD) WITH PROPOFOL  N/A 07/01/2019   Procedure: ESOPHAGOGASTRODUODENOSCOPY (EGD) WITH PROPOFOL ;  Surgeon: Ruby Corporal, MD;  Location: [facility];  Service: Endoscopy;  Laterality: N/A;   FEMORAL-POPLITEAL BYPASS GRAFT Left 06/19/2016   Procedure: Left Leg BYPASS GRAFT FEMORAL-POPLITEAL ARTERY;  Surgeon: Margherita Shell, MD;  Location: [facility];  Service: Vascular;  Laterality: Left;   GIVENS CAPSULE STUDY  08/13/2011   Procedure: GIVENS CAPSULE STUDY;  Surgeon: Asencion Blacksmith, MD,FACG;  Location: [facility] ENDOSCOPY;  Service: Endoscopy;  Laterality: N/A;   HAMMER TOE SURGERY Bilateral ~ 2000   HEMOSTASIS CLIP PLACEMENT  07/02/2019   Procedure: HEMOSTASIS CLIP PLACEMENT;  Surgeon: Ruby Corporal, MD;  Location: [facility];  Service: Endoscopy;;  hepatic flexure   HIATAL HERNIA REPAIR     HYDRADENITIS EXCISION  01/2011; 03/2012   'groin and abdomen; 03/2012" (09/14/2012)   HYDRADENITIS EXCISION  04/01/2012   Procedure: EXCISION HYDRADENITIS GROIN;  Surgeon: Azucena Bollard, MD;  Location: [facility];  Service: General;  Laterality: Bilateral;  Excision of Hydradenitis of Perineum   HYDRADENITIS EXCISION N/A 09/17/2013   Procedure: EXCISION PERINEAL HIDRADENITIS ;  Surgeon: Azucena Bollard, MD;  Location: [facility];  Service: General;  Laterality: N/A;  also in the pubis area   LEFT HEART CATH AND CORONARY ANGIOGRAPHY N/A 12/26/2016   Procedure: LEFT HEART CATH AND CORONARY ANGIOGRAPHY;  Surgeon: Swazil[place], Peter M, MD;  Location: [facility] INVASIVE CV LAB;  Service: Cardiovascular;  Laterality: N/A;   MASS EXCISION Right 09/17/2013   Procedure: EXCISION MASS;  Surgeon: Azucena Bollard, MD;  Location: [facility];  Service: General;  Laterality: Right;   NISSEN FUNDOPLICATION  1996   PERIPHERAL VASCULAR CATHETERIZATION N/A 11/03/2014   Procedure: Lower Extremity Angiography;  Surgeon: Avanell Leigh, MD;  Location: [facility];  Service: Cardiovascular;  Laterality: N/A;   PERIPHERAL  VASCULAR CATHETERIZATION N/A 01/29/2016   Procedure: Lower Extremity Angiography;  Surgeon: Avanell Leigh, MD;  Location: [facility] INVASIVE CV LAB;  Service: Cardiovascular;  Laterality: N/A;   PERIPHERAL VASCULAR INTERVENTION Left 10/01/2022   Procedure: PERIPHERAL VASCULAR INTERVENTION;  Surgeon: Margherita Shell, MD;  Location: [facility];  Service: Cardiovascular;  Laterality: Left;  fem-pop bypass   PERIPHERAL VASCULAR THROMBECTOMY Left 10/01/2022   Procedure: PERIPHERAL VASCULAR THROMBECTOMY;  Surgeon: Margherita Shell, MD;  Location: [facility];  Service: Cardiovascular;  Laterality: Left;  fem-pop bypass   POLYPECTOMY  07/02/2019   Procedure: POLYPECTOMY;  Surgeon: Ruby Corporal, MD;  Location: [facility];  Service: Endoscopy;;   POLYPECTOMY  07/21/2023   Procedure: POLYPECTOMY;  Surgeon: Kenney Peacemaker, MD;  Location: [facility];  Service: Gastroenterology;;   POSTERIOR LUMBAR FUSION  2008 X 2   REDUCTION MAMMAPLASTY  1996?   TONSILLECTOMY AND ADENOIDECTOMY  1959 AND 2000   UVULOPALATOPHARYNGOPLASTY, TONSILLECTOMY AND SEPTOPLASTY  2000's    reports that she quit smoking about 3 years ago. Her smoking use included cigarettes. She started smoking about 44 years ago. She has a 20.5 pack-year smoking history. She has never used smokeless tobacco. She reports that she does not drink alcohol and does not use drugs. family history includes Allergic rhinitis in her sister; Aneurysm in her sister; Breast cancer in her mother; Colon cancer in her paternal uncle; Diabetes in her father and sister; Emphysema in an other family member; Heart disease in her father, mother, and sister; Hyperlipidemia in her sister; Hypertension in her mother, sister, and sister; Stroke in her father. Allergies  Allergen Reactions   Sulfa  Antibiotics Shortness Of Breath and Palpitations   Codeine Other (See Comments)    Recovering Addict does not like to take Narcotics   Fish Allergy  Hives and  Swelling    Tongue swelling   Iodine  Swelling   Metformin  And Related Diarrhea   Soliqua [Insulin  Glargine-Lixisenatide] Itching and Other (See Comments)    "tongue swelling"   Shellfish Allergy  Swelling and Rash    Tongue swelling      Outpatient Encounter Medications as of 09/18/2023  Medication Sig   albuterol  (PROAIR  HFA) 108 (90 Base) MCG/ACT inhaler 1-2 inhalations every 4-6 hours as needed for cough or wheeze. (Patient taking differently: Inhale 1-2 puffs into the lungs every 4 (four) hours as needed for shortness of breath.)   ALPRAZolam  (XANAX ) 1 MG tablet Take 1 mg by mouth 2 (two) times daily as needed for anxiety.   aspirin  EC 81 MG tablet Take 81 mg by mouth daily.   Azelastine  HCl 0.15 % SOLN Place 2 sprays into both nostrils 2 (two) times daily. (Patient taking differently: Place 2 sprays into both nostrils daily as needed (allergies).)   bisacodyl  (DULCOLAX) 5 MG EC tablet Take 5 mg by mouth daily as needed for moderate constipation.   clopidogrel  (PLAVIX ) 75 MG tablet Take 1 tablet (75 mg total) by mouth daily.   Continuous Blood Gluc Receiver (DEXCOM G6 RECEIVER) DEVI by Does not apply route.   Continuous Glucose Sensor (DEXCOM G7 SENSOR) MISC 1 Device by Does not apply route as directed.   doxycycline  (VIBRAMYCIN ) 100 MG capsule Take 1 capsule (100 mg total) by mouth 2 (two) times daily. (Patient taking differently: Take 100 mg by mouth as needed (Infection).)   EPINEPHrine  0.3 mg/0.3 mL IJ SOAJ injection Inject 0.3 mLs (0.3 mg total) into the muscle once. (Patient taking differently: Inject 0.3 mg into the muscle as needed for anaphylaxis.)   estradiol (ESTRACE) 0.1 MG/GM vaginal cream Place 1 Applicatorful vaginally daily.   famotidine  (PEPCID ) 40 MG tablet Take 1 tablet (40 mg total) by mouth 2 (two) times daily.   ferrous gluconate (FERGON) 324 MG tablet Take 324 mg by mouth daily with breakfast.   hydrocortisone (ANUSOL-HC) 2.5 % rectal cream Place rectally daily as  needed for hemorrhoids.   Insulin  Disposable Pump (OMNIPOD 5 G7 INTRO, GEN 5,) KIT 1 Device by Does not apply route every other day.   Insulin  Disposable Pump (OMNIPOD  5 G7 PODS, GEN 5,) MISC 1 Device by Does not apply route every other day.   insulin  lispro (HUMALOG ) 100 UNIT/ML injection Max daily 60 units   losartan -hydrochlorothiazide  (HYZAAR) 50-12.5 MG tablet Take 1 tablet by mouth daily.   lubiprostone  (AMITIZA ) 8 MCG capsule Take 1 capsule (8 mcg total) by mouth 2 (two) times daily with a meal. (Patient taking differently: Take 8 mcg by mouth 2 (two) times daily as needed for constipation.)   Magnesium  200 MG TABS Take 1 tablet (200 mg total) by mouth daily. (Patient taking differently: Take 200 mg by mouth 2 (two) times daily.)   montelukast  (SINGULAIR ) 10 MG tablet Take 10 mg by mouth at bedtime as needed (allergies).   nitroGLYCERIN  (NITROSTAT ) 0.4 MG SL tablet PLACE 1 TAB UNDER TONGUE EVERY 5 MINS AS NEEDED FOR CHEST PAIN - MAX 3 DOSES THEN 911   nortriptyline  (PAMELOR ) 10 MG capsule Take 1 capsule (10 mg total) by mouth at bedtime. (Patient taking differently: Take 10 mg by mouth at bedtime as needed (IBS).)   ondansetron  (ZOFRAN ) 4 MG tablet Take 1 tablet (4 mg total) by mouth every 6 (six) hours as needed for nausea.   pantoprazole  (PROTONIX ) 40 MG tablet Take 1 tablet (40 mg total) by mouth 2 (two) times daily before a meal.   polyethylene glycol (MIRALAX  / GLYCOLAX ) 17 g packet Take 17 g by mouth daily as needed for moderate constipation or severe constipation.   potassium chloride  (KLOR-CON ) 10 MEQ tablet Take 10 mEq by mouth 2 (two) times daily.   rosuvastatin  (CRESTOR ) 40 MG tablet Take 1 tablet (40 mg total) by mouth daily.   SUMAtriptan  (IMITREX ) 100 MG tablet Take 100 mg by mouth every 2 (two) hours as needed for migraine.    terconazole (TERAZOL 3) 0.8 % vaginal cream Insert 1 applicatorful every day by vaginal route at bedtime for 3 days.   zolpidem  (AMBIEN ) 5 MG tablet Take  5 mg by mouth at bedtime as needed for sleep.   [DISCONTINUED] acetaminophen  (TYLENOL ) 325 MG tablet Take 2 tablets (650 mg total) by mouth every 6 (six) hours as needed for mild pain (or Fever >/= 101). (Patient not taking: Reported on 07/18/2023)   [DISCONTINUED] ferrous sulfate  325 (65 FE) MG EC tablet Take 1 tablet (325 mg total) by mouth every other day.   No facility-administered encounter medications on file as of 09/18/2023.     REVIEW OF SYSTEMS  : All other systems reviewed and negative except where noted in the History of Present Illness.   PHYSICAL EXAM: BP 120/70   Pulse 60   Ht 5\' 4"  (1.626 m)   Wt 129 lb (58.5 kg)   BMI 22.14 kg/m  General: Well developed female in no acute distress Head: Normocephalic and atraumatic Eyes:  Sclerae anicteric, conjunctiva pink. Ears: Normal auditory acuity Lungs: Clear throughout to auscultation; no W/R/R. Heart: Regular rate and rhythm; no M/R/G. Musculoskeletal: Symmetrical with no gross deformities  Skin: No lesions on visible extremities Neurological: Alert oriented x 4, grossly non-focal Psychological:  Alert and cooperative. Normal mood and affect  ASSESSMENT AND PLAN: *GI bleed/melena with anemia: Recent hospitalization in March with drop in hemoglobin from 12.5 g down to 8 g.  Symptomatic, episode of syncope.  CTA was negative.  Colonoscopy showed a small AVM which was treated with APC and a polyp which was removed.  No further sign of bleeding.  Hemoglobin last week at PCPs office was 11.2 g.  Iron studies normal. *  History of CVA and PVD now back on Plavix . *Constipation: Has struggled with constipation intermittently, now more so since on the iron supplement.  Previously had done well with Amitiza  8 mcg daily.  She is asking if we have any samples of anything.  Unfortunately told her we do not have samples of Amitiza , but we did give her some samples of Linzess  145 mcg daily to try. *Hemorrhoidal/perianal irritation: Has  hydrocortisone at home, is asking for refills on that.  Prescription sent to pharmacy.  -Continue iron supplement per PCPs recommendation. -CBC/hemoglobin, monitored by PCP. - If recurrent issues with bleeding or anemia then consider video capsule endoscopy.   CC:  Dickey Fought, [place]

## 2023-10-03 ENCOUNTER — Other Ambulatory Visit: Payer: Self-pay | Admitting: Surgery

## 2023-10-09 DIAGNOSIS — Z794 Long term (current) use of insulin: Secondary | ICD-10-CM | POA: Diagnosis not present

## 2023-10-09 DIAGNOSIS — E1165 Type 2 diabetes mellitus with hyperglycemia: Secondary | ICD-10-CM | POA: Diagnosis not present

## 2023-10-09 DIAGNOSIS — E1149 Type 2 diabetes mellitus with other diabetic neurological complication: Secondary | ICD-10-CM | POA: Diagnosis not present

## 2023-10-13 ENCOUNTER — Encounter: Payer: Self-pay | Admitting: Internal Medicine

## 2023-10-13 ENCOUNTER — Ambulatory Visit (INDEPENDENT_AMBULATORY_CARE_PROVIDER_SITE_OTHER): Payer: 59 | Admitting: Internal Medicine

## 2023-10-13 VITALS — BP 118/72 | HR 68 | Ht 64.0 in | Wt 124.0 lb

## 2023-10-13 DIAGNOSIS — Z794 Long term (current) use of insulin: Secondary | ICD-10-CM | POA: Diagnosis not present

## 2023-10-13 DIAGNOSIS — E785 Hyperlipidemia, unspecified: Secondary | ICD-10-CM

## 2023-10-13 DIAGNOSIS — E1142 Type 2 diabetes mellitus with diabetic polyneuropathy: Secondary | ICD-10-CM | POA: Diagnosis not present

## 2023-10-13 DIAGNOSIS — E1129 Type 2 diabetes mellitus with other diabetic kidney complication: Secondary | ICD-10-CM

## 2023-10-13 DIAGNOSIS — E1159 Type 2 diabetes mellitus with other circulatory complications: Secondary | ICD-10-CM

## 2023-10-13 DIAGNOSIS — R809 Proteinuria, unspecified: Secondary | ICD-10-CM

## 2023-10-13 LAB — POCT GLYCOSYLATED HEMOGLOBIN (HGB A1C): Hemoglobin A1C: 6.3 % — AB (ref 4.0–5.6)

## 2023-10-13 MED ORDER — ROSUVASTATIN CALCIUM 40 MG PO TABS: 40.0000 mg | ORAL_TABLET | Freq: Every day | ORAL | 3 refills | Status: AC

## 2023-10-13 MED ORDER — INSULIN LISPRO 100 UNIT/ML IJ SOLN: INTRAMUSCULAR | 3 refills | Status: DC

## 2023-10-13 NOTE — Patient Instructions (Signed)

## 2023-10-13 NOTE — Progress Notes (Signed)
 Name: [name]  Age/ Sex: 68 y.o., female   MRN/ DOB: [id number], 11-Sep-1955     PCP: Dickey Fought, PA   Reason for Endocrinology Evaluation: Type 2 Diabetes Mellitus  Initial Endocrine Consultative Visit: 04/17/2020    PATIENT IDENTIFIER: [name] is a 68 y.o. female with a past medical history of T2DM, PAD ,CAD, HTN and Hx of pancreatitis  . The patient has followed with Endocrinology clinic since 04/17/2020 for consultative assistance with management of her diabetes.  DIABETIC HISTORY:  [name] was diagnosed with DM in 2003, She is intolerant to Metformin  due to diarrhea and Soliqua due to itching . Her hemoglobin A1c has ranged from 7.1%  in 20, peaking at 9.5% in 2015  Has chronic GI issues - Dr Willy Harvest   Islet Ab's and GAD-65 negative   Jardiance  started 07/2020, stopped 02/2023 due to recurrent yeast infections Omnipod started 08/2020   SUBJECTIVE:   During the last visit (06/13/2023) :  A1c 6.9%   Today (10/13/2023): [name] is here for diabetes follow up .  She checks her blood sugars multiple times a day through CGM. The patient has had hypoglycemic episodes since the last clinic visit.   She follows with Dr. Willy Harvest for dyspepsia and IBS  She presented to the ED with a symptomatic anemia in March, 2025, she is s/p upper and lower GI endoscopy ) She required transfusion , she is on iron as well   She continues with chronic constipation and dark stools   This patient with type 2 diabetes is treated with Omnipod (insulin  pump). During the visit the pump basal and bolus doses were reviewed including carb/insulin  rations and supplemental doses. The clinical list was updated. The glucose meter download was reviewed in detail to determine if the current pump settings are providing the best glycemic control without excessive hypoglycemia.   Pump and meter download:    Pump   Omnipod Settings   Insulin  type   Humalog     Basal rate       0000   1.30u/h    0400 1.45   0800 1.55   1200 1.60      I:C ratio       0000 1:5                  Sensitivity       0000  20      Goal       0000  120           Type & Model of Pump: Omnipod Insulin  Type: Currently using Humalog  .     PUMP STATISTICS: Average BG: 152 Average Daily Carbs (g):65.8 Average Total Daily Insulin :48.5 Average Daily Basal: 26.6 (55 %) Average Daily Bolus: 21.9(45 %)   CONTINUOUS GLUCOSE MONITORING RECORD INTERPRETATION    Dates of Recording: 5/27-10/13/2023  Sensor description:dexcom  Results statistics:   CGM use % of time 88.6  Average and SD 152/45  Time in range 76 %  % Time Above 180 21  % Time above 250 3  % Time Below target 0   Glycemic patterns summary: BGs are optimal throughout the day and night Hyperglycemic episodes postprandial   Hypoglycemic episodes occurred during the day  Overnight periods: Optimal  HOME DIABETES REGIMEN:  Humalog  Jardiance  25 mg daily     Statin: yes ACE-I/ARB: yes   DIABETIC COMPLICATIONS: Microvascular complications:  Neuropathy  Denies: CKD, retinopathy Last Eye Exam: Completed 12/27/2021  Macrovascular complications:  PVD ( S/P femoral-popliteal bypass), Mild CAD  Denies: CAD, CVA   HISTORY:  Past Medical History:  Past Medical History:  Diagnosis Date   Allergic urticaria 07/10/2015   Allergy     Anemia    hx   Angioedema 07/10/2015   Anxiety    Arthritis    "neck, left hand" (09/14/2012)   Asthma    CAD (coronary artery disease)    MI 2003 and 2013   Cancer (HCC) 1985   ovarian, no treatment except surgery   Cataract    Complication of anesthesia    OCCASIONAL TROUBLE TURNING NECK TO RIGHT   Critical lower limb ischemia (HCC)    10/2014 s/p L SFA stenting   Diverticulosis of colon with hemorrhage 08/2011   GERD (gastroesophageal reflux disease)    GI bleed    H/O hiatal hernia    Hidradenitis    groin   History of blood transfusion 1985 AND 2013    Hyperlipidemia    Hypertension    Irritable bowel syndrome    Left-sided weakness    since stroke, left eye trouble seeing   Migraines    Neuropathy    Obesity    Osteoporosis    PAD (peripheral artery disease) (HCC)    a. critical limb ischemia s/p PTA/stenting of L SFA 10/2014. c. occ prior SFA stent by angio 01/2016, for possible PV bypass.   Pneumonia    baby   Recurrent upper respiratory infection (URI)    S/P arterial stent-mid Lt SFA 11/03/14 11/04/2014   Schatzki's ring    Sinus problem    Stomach ulcer 1972   non-bleeding   Stroke (HCC) 07-2007, 07-2008, 07-2009   total 3 strokes; mild left sided weakness and left eye "jumps".   Type II diabetes mellitus (HCC)    Vitamin B 12 deficiency 07/15/2013   Past Surgical History:  Past Surgical History:  Procedure Laterality Date   ABDOMINAL AORTOGRAM W/LOWER EXTREMITY Left 10/01/2022   Procedure: ABDOMINAL AORTOGRAM W/LOWER EXTREMITY;  Surgeon: Margherita Shell, MD;  [facility]: [facility];  Service: Cardiovascular;  Laterality: Left;   ABDOMINAL HYSTERECTOMY  1985   ADENOIDECTOMY     ANTERIOR CERVICAL DECOMP/DISCECTOMY FUSION  2002   ANTERIOR CERVICAL DECOMP/DISCECTOMY FUSION N/A 04/08/2014   Procedure: Cervical Six-Seven ANTERIOR CERVICAL DECOMPRESSION/DISCECTOMY FUSION Plating and Bonegraft  2 LEVELS;  Surgeon: Audie Bleacher, MD;  Location: [facility];  Service: Neurosurgery;  Laterality: N/A;  Cervical Six-Seven ANTERIOR CERVICAL DECOMPRESSION/DISCECTOMY FUSION Plating and Bonegraft  2 LEVELS   APPENDECTOMY  1985   AXILLARY HIDRADENITIS EXCISION  1990-2008   bilateral   BACK SURGERY     BREAST BIOPSY Right 2007   BREAST CYST EXCISION Right 2008   BREAST REDUCTION SURGERY     CARDIAC CATHETERIZATION  2004   mild disease   CATARACT EXTRACTION W/PHACO Left 05/16/2015   Procedure: CATARACT EXTRACTION PHACO AND INTRAOCULAR LENS PLACEMENT (IOC);  Surgeon: Albert Huff, MD;  Location: [facility];  Service: Ophthalmology;   Laterality: Left;  CDE: 4.24   CATARACT EXTRACTION W/PHACO Right 05/30/2015   Procedure: CATARACT EXTRACTION RIGHT EYE PHACO AND INTRAOCULAR LENS PLACEMENT ;  Surgeon: Albert Huff, MD;  Location: [facility];  Service: Ophthalmology;  Laterality: Right;  CDE:4.08   CHOLECYSTECTOMY  1990's   COLONOSCOPY  08/12/2011   Procedure: COLONOSCOPY;  Surgeon: Asencion Blacksmith, MD,FACG;  Location[facility] ENDOSCOPY;  Service: Endoscopy;  Laterality: N/A;   COLONOSCOPY  06/19/2006   COLONOSCOPY N/A 07/21/2023  Procedure: COLONOSCOPY;  Surgeon: Kenney Peacemaker, MD;  Location: [facility];  Service: Gastroenterology;  Laterality: N/A;   COLONOSCOPY  07/21/2023   Procedure: COLONOSCOPY, WITH ARGON PLASMA COAGULATION;  Surgeon: Kenney Peacemaker, MD;  Location: [facility] ENDOSCOPY;  Service: Gastroenterology;;   COLONOSCOPY WITH PROPOFOL  N/A 07/02/2019   Procedure: COLONOSCOPY WITH PROPOFOL ;  Surgeon: Ruby Corporal, MD;  Location: [facility];  Service: Endoscopy;  Laterality: N/A;   cyst thigh Right    ENTEROSCOPY N/A 07/18/2023   Procedure: ENTEROSCOPY;  Surgeon: Lajuan Pila, MD;  Location: [facility] ENDOS[facility];  Service: Gastroenterology;  Laterality: N/A;  ultraslim scope   ESOPHAGOGASTRODUODENOSCOPY  08/12/2011   Procedure: ESOPHAGOGASTRODUODENOSCOPY (EGD);  Surgeon: Asencion Blacksmith, MD,FACG;  Location: [facility] ENDOSCOPY;  Service: Endoscopy;  Laterality: N/A;   ESOPHAGOGASTRODUODENOSCOPY  06/04/2005   ESOPHAGOGASTRODUODENOSCOPY (EGD) WITH PROPOFOL  N/A 07/01/2019   Procedure: ESOPHAGOGASTRODUODENOSCOPY (EGD) WITH PROPOFOL ;  Surgeon: Ruby Corporal, MD;  [facility]: [facility];  Service: Endoscopy;  Laterality: N/A;   FEMORAL-POPLITEAL BYPASS GRAFT Left 06/19/2016   Procedure: Left Leg BYPASS GRAFT FEMORAL-POPLITEAL ARTERY;  Surgeon: Margherita Shell, MD;  Location: [facility] OR;  Service: Vascular;  Laterality: Left;   GIVENS CAPSULE STUDY  08/13/2011   Procedure: GIVENS CAPSULE STUDY;  Surgeon: Asencion Blacksmith, MD,FACG;  Location: [facility] ENDOSCOPY;   Service: Endoscopy;  Laterality: N/A;   HAMMER TOE SURGERY Bilateral ~ 2000   HEMOSTASIS CLIP PLACEMENT  07/02/2019   Procedure: HEMOSTASIS CLIP PLACEMENT;  Surgeon: Ruby Corporal, MD;  Location: [facility];  Service: Endoscopy;;  hepatic flexure   HIATAL HERNIA REPAIR     HYDRADENITIS EXCISION  01/2011; 03/2012   'groin and abdomen; 03/2012" (09/14/2012)   HYDRADENITIS EXCISION  04/01/2012   Procedure: EXCISION HYDRADENITIS GROIN;  Surgeon: Azucena Bollard, MD;  Location: [facility];  Service: General;  Laterality: Bilateral;  Excision of Hydradenitis of Perineum   HYDRADENITIS EXCISION N/A 09/17/2013   Procedure: EXCISION PERINEAL HIDRADENITIS ;  Surgeon: Azucena Bollard, MD;  Location: [facility];  Service: General;  Laterality: N/A;  also in the pubis area   LEFT HEART CATH AND CORONARY ANGIOGRAPHY N/A 12/26/2016   Procedure: LEFT HEART CATH AND CORONARY ANGIOGRAPHY;  Surgeon: Swaziland, Peter M, MD;  Location: [facility] INVASIVE CV LAB;  Service: Cardiovascular;  Laterality: N/A;   MASS EXCISION Right 09/17/2013   Procedure: EXCISION MASS;  Surgeon: Azucena Bollard, MD;  Location: [facility] OR[facility];  Service: General;  Laterality: Right;   NISSEN FUNDOPLICATION  1996   PERIPHERAL VASCULAR CATHETERIZATION N/A 11/03/2014   Procedure: Lower Extremity Angiography;  Surgeon: Avanell Leigh, MD;  [facility]: [facility];  Service: Cardiovascular;  Laterality: N/A;   PERIPHERAL VASCULAR CATHETERIZATION N/A 01/29/2016   Procedure: Lower Extremity Angiography;  Surgeon: Avanell Leigh, MD;  Location: [facility]VASIVE CV LAB;  Service: Cardiovascular;  Laterality: N/A;   PERIPHERAL VASCULAR INTERVENTION Left 10/01/2022   Procedure: PERIPHERAL VASCULAR INTERVENTION;  Surgeon: Margherita Shell, MD;  Location: [facility];  Service: Cardiovascular;  Laterality: Left;  fem-pop bypass   PERIPHERAL VASCULAR THROMBECTOMY Left 10/01/2022   Procedure: PERIPHERAL VASCULAR THROMBECTOMY;  Surgeon: Margherita Shell, MD;   Location: [facility];  Service: Cardiovascular;  Laterality: Left;  fem-pop bypass   POLYPECTOMY  07/02/2019   Procedure: POLYPECTOMY;  Surgeon: Ruby Corporal, MD;  Location: [facility];  Service: Endoscopy;;   POLYPECTOMY  07/21/2023   Procedure: POLYPECTOMY;  Surgeon: Kenney Peacemaker, MD;  Location: [facility] ENDOS[facility];  Service: Gastroenterology;;   POSTERIOR LUMBAR FUSION  2008 X 2   REDUCTION MAMMAPLASTY  1996?   TONSILLECTOMY AND ADENOIDECTOMY  1959 AND 2000   UVULOPALATOPHARYNGOPLASTY, TONSILLECTOMY AND SEPTOPLASTY  2000's   Social History:  reports that she quit smoking about 3 years ago. Her smoking use included cigarettes. She started smoking about 44 years ago. She has a 20.5 pack-year smoking history. She has never used smokeless tobacco. She reports that she does not drink alcohol and does not use drugs. Family History:  Family History  Problem Relation Age of Onset   Breast cancer Mother    Heart disease Mother    Hypertension Mother    Diabetes Father    Heart disease Father    Stroke Father    Heart disease Sister    Hypertension Sister    Hypertension Sister    Hyperlipidemia Sister    Diabetes Sister    Allergic rhinitis Sister    Emphysema Other        great uncle   Aneurysm Sister        brain   Colon cancer Paternal Uncle    Angioedema Neg Hx    Asthma Neg Hx    Eczema Neg Hx    Immunodeficiency Neg Hx    Urticaria Neg Hx      HOME MEDICATIONS: Allergies as of 10/13/2023       Reactions   Sulfa  Antibiotics Shortness Of Breath, Palpitations   Codeine Other (See Comments)   Recovering Addict does not like to take Narcotics   Fish Allergy  Hives, Swelling   Tongue swelling   Iodine  Swelling   Metformin  And Related Diarrhea   Soliqua [insulin  Glargine-lixisenatide] Itching, Other (See Comments)   "tongue swelling"   Shellfish Allergy  Swelling, Rash   Tongue swelling        Medication List        Accurate as of October 13, 2023  2:00 PM. If  you have any questions, ask your nurse or doctor.          albuterol  108 (90 Base) MCG/ACT inhaler Commonly known as: ProAir  HFA 1-2 inhalations every 4-6 hours as needed for cough or wheeze. What changed:  how much to take how to take this when to take this reasons to take this additional instructions   ALPRAZolam  1 MG tablet Commonly known as: XANAX  Take 1 mg by mouth 2 (two) times daily as needed for anxiety.   aspirin  EC 81 MG tablet Take 81 mg by mouth daily.   Azelastine  HCl 0.15 % Soln Place 2 sprays into both nostrils 2 (two) times daily. What changed:  when to take this reasons to take this   bisacodyl  5 MG EC tablet Commonly known as: DULCOLAX Take 5 mg by mouth daily as needed for moderate constipation.   clopidogrel  75 MG tablet Commonly known as: PLAVIX  TAKE 1 TABLET(75 MG) BY MOUTH DAILY   Dexcom G6 Receiver Devi by Does not apply route.   Dexcom G7 Sensor Misc 1 Device by Does not apply route as directed.   doxycycline  100 MG capsule Commonly known as: VIBRAMYCIN  Take 1 capsule (100 mg total) by mouth 2 (two) times daily. What changed:  when to take this reasons to take this   EPINEPHrine  0.3 mg/0.3 mL Soaj injection Commonly known as: EPI-PEN Inject 0.3 mLs (0.3 mg total) into the muscle once. What changed:  when to take this reasons to take this   estradiol 0.1 MG/GM  vaginal cream Commonly known as: ESTRACE Place 1 Applicatorful vaginally daily.   famotidine  40 MG tablet Commonly known as: PEPCID  Take 1 tablet (40 mg total) by mouth 2 (two) times daily.   ferrous gluconate 324 MG tablet Commonly known as: FERGON Take 324 mg by mouth daily with breakfast.   hydrocortisone  2.5 % rectal cream Commonly known as: ANUSOL -HC Place rectally daily as needed for hemorrhoids.   insulin  lispro 100 UNIT/ML injection Commonly known as: HUMALOG  Max daily 60 units   linaclotide  145 MCG Caps capsule Commonly known as: LINZESS  Take 1  capsule (145 mcg total) by mouth daily before breakfast.   losartan -hydrochlorothiazide  50-12.5 MG tablet Commonly known as: Hyzaar Take 1 tablet by mouth daily.   lubiprostone  8 MCG capsule Commonly known as: AMITIZA  Take 1 capsule (8 mcg total) by mouth 2 (two) times daily with a meal. What changed:  when to take this reasons to take this   Magnesium  200 MG Tabs Take 1 tablet (200 mg total) by mouth daily. What changed: when to take this   montelukast  10 MG tablet Commonly known as: SINGULAIR  Take 10 mg by mouth at bedtime as needed (allergies).   mupirocin  cream 2 % Commonly known as: BACTROBAN  Apply topically.   nitroGLYCERIN  0.4 MG SL tablet Commonly known as: NITROSTAT  PLACE 1 TAB UNDER TONGUE EVERY 5 MINS AS NEEDED FOR CHEST PAIN - MAX 3 DOSES THEN 911   nortriptyline  10 MG capsule Commonly known as: PAMELOR  Take 1 capsule (10 mg total) by mouth at bedtime. What changed:  when to take this reasons to take this   Omnipod 5 G7 Intro (Gen 5) Kit 1 Device by Does not apply route every other day.   Omnipod 5 G7 Pods (Gen 5) Misc 1 Device by Does not apply route every other day.   ondansetron  4 MG tablet Commonly known as: ZOFRAN  Take 1 tablet (4 mg total) by mouth every 6 (six) hours as needed for nausea.   pantoprazole  40 MG tablet Commonly known as: PROTONIX  Take 1 tablet (40 mg total) by mouth 2 (two) times daily before a meal.   polyethylene glycol 17 g packet Commonly known as: MIRALAX  / GLYCOLAX  Take 17 g by mouth daily as needed for moderate constipation or severe constipation.   potassium chloride  10 MEQ tablet Commonly known as: KLOR-CON  Take 10 mEq by mouth 2 (two) times daily.   rosuvastatin  40 MG tablet Commonly known as: CRESTOR  Take 1 tablet (40 mg total) by mouth daily.   SUMAtriptan  100 MG tablet Commonly known as: IMITREX  Take 100 mg by mouth every 2 (two) hours as needed for migraine.   terconazole 0.8 % vaginal cream Commonly  known as: TERAZOL 3 Insert 1 applicatorful every day by vaginal route at bedtime for 3 days.   tiZANidine  4 MG tablet Commonly known as: ZANAFLEX  1 tablet at bedtime as needed Orally Once a day for 10 days   zolpidem  5 MG tablet Commonly known as: AMBIEN  Take 5 mg by mouth at bedtime as needed for sleep.         OBJECTIVE:   Vital Signs: BP 118/72 (BP Location: Left Arm, Patient Position: Sitting, Cuff Size: Normal)   Pulse 68   Ht 5\' 4"  (1.626 m)   Wt 124 lb (56.2 kg)   SpO2 95%   BMI 21.28 kg/m   Wt Readings from Last 3 Encounters:  10/13/23 124 lb (56.2 kg)  09/18/23 129 lb (58.5 kg)  07/21/23 126 lb 1.7 oz (  57.2 kg)     Exam: General: Pt appears well and is in NAD  Lungs: Clear with good BS bilat   Heart: RRR   Extremities: No pretibial edema.  Neuro: MS is good with appropriate affect, pt is alert and Ox3      DM foot exam: 06/13/2023   The skin of the feet is intact without sores or ulcerations. The pedal pulses are 1 + bilaterally  The sensation is intact to a screening 5.07, 10 gram monofilament bilaterally      DATA REVIEWED:  Latest Reference Range & Units 07/23/23 04:11  Sodium 135 - 145 mmol/L 136  Potassium 3.5 - 5.1 mmol/L 3.2 (L)  Chloride 98 - 111 mmol/L 110  CO2 22 - 32 mmol/L 19 (L)  Glucose 70 - 99 mg/dL 829 (H)  BUN 8 - 23 mg/dL 12  Creatinine 5.62 - 1.30 mg/dL 8.65  Calcium  8.9 - 10.3 mg/dL 8.9  Anion gap 5 - 15  7  Phosphorus 2.5 - 4.6 mg/dL 2.2 (L)  Magnesium  1.7 - 2.4 mg/dL 2.1  GFR, Estimated >78 mL/min >60      Glutamic Acid Decarb Ab <5 IU/mL <5     ISLET CELL ANTIBODY SCREEN Negative    Old records , labs and images have been reviewed.    ASSESSMENT / PLAN / RECOMMENDATIONS:   1) Type 2 Diabetes Mellitus, Optimally  controlled, with neuropathic and macrovascular complications and microalbuminuria  - Most recent A1c of 6.3%. Goal A1c < 7.0 %.    -A1c continues to be optimal -Patient has been noted with insulin   stacking, patient states she took glucose tablets but the same time she would bolus for them?  I did explain to the patient that when she eats sugar tablets she should not bolus for them as they are there to help for hypoglycemia - Will decrease basal rate during the day -Intolerant to Jardiance  due to recurrent yeast infections -Has Hx of pancreatitis in 2010, so DPP-4 inhibitors and GLp-1 agonists are CONTRAINDICATED - GAD-65 and Islet cell Ab's negative    MEDICATIONS:  Humalog      Pump   Omnipod Settings   Insulin  type   Humalog     Basal rate       0000  1.25 u/h    0400 1.40   0800 1.55   1200 1.55      I:C ratio       0000 1:5                  Sensitivity       0000  20      Goal       0000  120        EDUCATION / INSTRUCTIONS: BG monitoring instructions: Patient is instructed to check her blood sugars 4 times a day, before meals and bedtime . Call [facility] if: BG persistently < 70  I reviewed the Rule of 15 for the treatment of hypoglycemia in detail with the patient. Literature supplied.   2) Diabetic complications:  Eye: Does not have known diabetic retinopathy.  Neuro/ Feet: Does  have known diabetic peripheral neuropathy .  Renal: Patient does not have known baseline CKD.    3) Dyslipidemia:  - LDL has been at goal  - No changes     Medication Continue rosuvastatin  40 mg daily    4) Microalbuminuria :   -  MA/Cr ratio slightly elevated at 48 in February 2023, repeat 2024  shows normalization  - She is on Losartan - HCTZ    F/U 4 months  I spent 25 minutes preparing to see the patient by review of recent labs, imaging and procedures, obtaining and reviewing separately obtained history, communicating with the patient, ordering medications, tests or procedures, and documenting clinical information in the EHR including the differential Dx, treatment, and any further evaluation and other management    Signed electronically  by: Natale Bail, MD  [facility] Endocrinology  [facility] [address] [place] [place], [place] [address] Phone: [phone] FAX: [phone]   CC: Dickey Fought, PA [address] [place] [address] Phone: [phone]  Fax: [phone]  Return to Endocrinology clinic as below: No future appointments.

## 2023-10-20 DIAGNOSIS — D500 Iron deficiency anemia secondary to blood loss (chronic): Secondary | ICD-10-CM | POA: Diagnosis not present

## 2023-10-20 DIAGNOSIS — Z6820 Body mass index (BMI) 20.0-20.9, adult: Secondary | ICD-10-CM | POA: Diagnosis not present

## 2023-10-20 DIAGNOSIS — Z0000 Encounter for general adult medical examination without abnormal findings: Secondary | ICD-10-CM | POA: Diagnosis not present

## 2023-10-20 DIAGNOSIS — G4700 Insomnia, unspecified: Secondary | ICD-10-CM | POA: Diagnosis not present

## 2023-11-08 DIAGNOSIS — Z794 Long term (current) use of insulin: Secondary | ICD-10-CM | POA: Diagnosis not present

## 2023-11-08 DIAGNOSIS — E1149 Type 2 diabetes mellitus with other diabetic neurological complication: Secondary | ICD-10-CM | POA: Diagnosis not present

## 2023-11-08 DIAGNOSIS — E1165 Type 2 diabetes mellitus with hyperglycemia: Secondary | ICD-10-CM | POA: Diagnosis not present

## 2023-11-10 ENCOUNTER — Telehealth: Payer: Self-pay | Admitting: Internal Medicine

## 2023-11-10 DIAGNOSIS — Z6821 Body mass index (BMI) 21.0-21.9, adult: Secondary | ICD-10-CM | POA: Diagnosis not present

## 2023-11-10 NOTE — Telephone Encounter (Signed)
 Patient is stating her blood sugar numbers are consistently low and is concerned.She would like to be contacted on what to do before next visit in August.Her contact info is [phone]

## 2023-11-11 ENCOUNTER — Telehealth: Payer: Self-pay

## 2023-11-11 ENCOUNTER — Other Ambulatory Visit: Payer: Self-pay

## 2023-11-11 ENCOUNTER — Encounter: Payer: Self-pay | Admitting: Internal Medicine

## 2023-11-11 DIAGNOSIS — Z794 Long term (current) use of insulin: Secondary | ICD-10-CM

## 2023-11-11 MED ORDER — OMNIPOD 5 G7 PODS (GEN 5) MISC: 1.0000 | 3 refills | Status: DC

## 2023-11-11 NOTE — Telephone Encounter (Signed)
 Patient talked through making the basal rate changes per Dr. Kris message.

## 2023-11-11 NOTE — Telephone Encounter (Signed)
 LVMTCB

## 2023-11-11 NOTE — Telephone Encounter (Signed)
 Patient transferred to [facility] to walk through basal rate changes. If patient is unable to make changes she will need to come in

## 2023-11-11 NOTE — Telephone Encounter (Signed)
LVM and also sent mychart message

## 2023-11-17 ENCOUNTER — Other Ambulatory Visit: Payer: Self-pay

## 2023-11-17 DIAGNOSIS — Z794 Long term (current) use of insulin: Secondary | ICD-10-CM

## 2023-11-17 MED ORDER — OMNIPOD 5 G7 INTRO (GEN 5) KIT: 1.0000 | PACK | 0 refills | Status: DC

## 2023-11-17 MED ORDER — OMNIPOD 5 G7 PODS (GEN 5) MISC: 1.0000 | 3 refills | Status: DC

## 2023-12-09 ENCOUNTER — Other Ambulatory Visit: Payer: Self-pay | Admitting: Physician Assistant

## 2023-12-09 ENCOUNTER — Ambulatory Visit

## 2023-12-09 DIAGNOSIS — E1165 Type 2 diabetes mellitus with hyperglycemia: Secondary | ICD-10-CM | POA: Diagnosis not present

## 2023-12-09 DIAGNOSIS — Z1231 Encounter for screening mammogram for malignant neoplasm of breast: Secondary | ICD-10-CM

## 2023-12-09 DIAGNOSIS — Z794 Long term (current) use of insulin: Secondary | ICD-10-CM | POA: Diagnosis not present

## 2023-12-09 DIAGNOSIS — E1149 Type 2 diabetes mellitus with other diabetic neurological complication: Secondary | ICD-10-CM | POA: Diagnosis not present

## 2023-12-25 ENCOUNTER — Ambulatory Visit: Admitting: Internal Medicine

## 2024-01-08 DIAGNOSIS — E1165 Type 2 diabetes mellitus with hyperglycemia: Secondary | ICD-10-CM | POA: Diagnosis not present

## 2024-01-08 DIAGNOSIS — E1149 Type 2 diabetes mellitus with other diabetic neurological complication: Secondary | ICD-10-CM | POA: Diagnosis not present

## 2024-01-08 DIAGNOSIS — Z794 Long term (current) use of insulin: Secondary | ICD-10-CM | POA: Diagnosis not present

## 2024-01-12 DIAGNOSIS — I739 Peripheral vascular disease, unspecified: Secondary | ICD-10-CM | POA: Diagnosis not present

## 2024-01-12 DIAGNOSIS — I10 Essential (primary) hypertension: Secondary | ICD-10-CM | POA: Diagnosis not present

## 2024-01-12 DIAGNOSIS — Z87891 Personal history of nicotine dependence: Secondary | ICD-10-CM | POA: Diagnosis not present

## 2024-01-12 DIAGNOSIS — E7800 Pure hypercholesterolemia, unspecified: Secondary | ICD-10-CM | POA: Diagnosis not present

## 2024-01-12 DIAGNOSIS — Z87898 Personal history of other specified conditions: Secondary | ICD-10-CM | POA: Diagnosis not present

## 2024-01-12 DIAGNOSIS — K581 Irritable bowel syndrome with constipation: Secondary | ICD-10-CM | POA: Diagnosis not present

## 2024-01-12 DIAGNOSIS — Z8673 Personal history of transient ischemic attack (TIA), and cerebral infarction without residual deficits: Secondary | ICD-10-CM | POA: Diagnosis not present

## 2024-01-12 DIAGNOSIS — D500 Iron deficiency anemia secondary to blood loss (chronic): Secondary | ICD-10-CM | POA: Diagnosis not present

## 2024-01-12 DIAGNOSIS — I25118 Atherosclerotic heart disease of native coronary artery with other forms of angina pectoris: Secondary | ICD-10-CM | POA: Diagnosis not present

## 2024-01-12 DIAGNOSIS — Z1231 Encounter for screening mammogram for malignant neoplasm of breast: Secondary | ICD-10-CM | POA: Diagnosis not present

## 2024-01-12 DIAGNOSIS — E119 Type 2 diabetes mellitus without complications: Secondary | ICD-10-CM | POA: Diagnosis not present

## 2024-01-12 DIAGNOSIS — Z794 Long term (current) use of insulin: Secondary | ICD-10-CM | POA: Diagnosis not present

## 2024-01-22 DIAGNOSIS — R197 Diarrhea, unspecified: Secondary | ICD-10-CM | POA: Diagnosis not present

## 2024-02-07 DIAGNOSIS — Z794 Long term (current) use of insulin: Secondary | ICD-10-CM | POA: Diagnosis not present

## 2024-02-07 DIAGNOSIS — E1149 Type 2 diabetes mellitus with other diabetic neurological complication: Secondary | ICD-10-CM | POA: Diagnosis not present

## 2024-02-07 DIAGNOSIS — E1165 Type 2 diabetes mellitus with hyperglycemia: Secondary | ICD-10-CM | POA: Diagnosis not present

## 2024-02-16 ENCOUNTER — Ambulatory Visit: Admitting: Internal Medicine

## 2024-02-16 DIAGNOSIS — J069 Acute upper respiratory infection, unspecified: Secondary | ICD-10-CM | POA: Diagnosis not present

## 2024-02-16 DIAGNOSIS — Z6822 Body mass index (BMI) 22.0-22.9, adult: Secondary | ICD-10-CM | POA: Diagnosis not present

## 2024-02-23 DIAGNOSIS — Z23 Encounter for immunization: Secondary | ICD-10-CM | POA: Diagnosis not present

## 2024-03-03 ENCOUNTER — Other Ambulatory Visit: Payer: Self-pay

## 2024-03-03 MED ORDER — DEXCOM G7 SENSOR MISC: 3 refills | Status: DC

## 2024-03-03 NOTE — Telephone Encounter (Signed)
Dexcom refilled.

## 2024-04-08 ENCOUNTER — Telehealth: Payer: Self-pay | Admitting: Gastroenterology

## 2024-04-08 DIAGNOSIS — R197 Diarrhea, unspecified: Secondary | ICD-10-CM

## 2024-04-08 NOTE — Telephone Encounter (Signed)
 Spoke with [name] again, she states that in the beginning she was taking the Imodium  every 4 hours because that is what her pharmacist recommended. She reports that it did not help, she is now reporting spasms that come when she eats and then she has to go to the bathroom. [name] asks, Is there anyway she can give me something to help because the Imodium  didn't work. Orders placed for stool specimens, [name] states depending on the weather, she will come in tomorrow or Monday to pick up the collection containers. Also discussed ER precautions with [name]. She verbalized understanding.

## 2024-04-08 NOTE — Telephone Encounter (Signed)
 PT is scheduled to come in 12/11 but is having extreme diarrhea and can't eat. Requesting to speak with a nurse to see if there's something we can prescribe her. Please advise.

## 2024-04-08 NOTE — Telephone Encounter (Signed)
 Spoke with pt. She reports that since last week, she has been having diarrhea. Everything I put in my mouth, I have to go straight to the bathroom, even water . Pt reports that she has tried OTC Imodium , but it has not helped her. Reports she goes at least 2-3 x per day. Pt is wanting to know if there is anything she can be given to help with this issue.

## 2024-04-09 MED ORDER — DICYCLOMINE HCL 10 MG PO CAPS: 10.0000 mg | ORAL_CAPSULE | Freq: Three times a day (TID) | ORAL | 1 refills | Status: DC

## 2024-04-09 NOTE — Telephone Encounter (Signed)
 Spoke with pt. Discussed trial of Dicyclomine  AC and HS. Pharmacy verified. Medication sent to preferred pharmacy.

## 2024-04-12 ENCOUNTER — Other Ambulatory Visit

## 2024-04-15 ENCOUNTER — Ambulatory Visit (INDEPENDENT_AMBULATORY_CARE_PROVIDER_SITE_OTHER): Admitting: Internal Medicine

## 2024-04-15 ENCOUNTER — Encounter: Payer: Self-pay | Admitting: Internal Medicine

## 2024-04-15 VITALS — BP 108/56 | HR 80 | Ht 63.0 in | Wt 133.0 lb

## 2024-04-15 DIAGNOSIS — K582 Mixed irritable bowel syndrome: Secondary | ICD-10-CM | POA: Diagnosis not present

## 2024-04-15 DIAGNOSIS — F439 Reaction to severe stress, unspecified: Secondary | ICD-10-CM

## 2024-04-15 NOTE — Patient Instructions (Addendum)
 Dr Avram wants you to take 2 dicyclomine  four times a day. Message us  and let us  know if it is helping.   _______________________________________________________  If your blood pressure at your visit was 140/90 or greater, please contact your primary care physician to follow up on this.  _______________________________________________________  If you are age 69 or older, your body mass index should be between 23-30. Your Body mass index is 23.56 kg/m. If this is out of the aforementioned range listed, please consider follow up with your Primary Care Provider.  If you are age 29 or younger, your body mass index should be between 19-25. Your Body mass index is 23.56 kg/m. If this is out of the aformentioned range listed, please consider follow up with your Primary Care Provider.   ________________________________________________________  The [facility] providers would like to encourage you to use MYCHART to communicate with providers for non-urgent requests or questions.  Due to long hold times on the telephone, sending your provider a message by [facility] may be a faster and more efficient way to get a response.  Please allow 48 business hours for a response.  Please remember that this is for non-urgent requests.  _______________________________________________________  [facility] is using a team-based approach to care.  Your team is made up of your doctor and two to three APPS. Our APPS (Nurse Practitioners and Physician Assistants) work with your physician to ensure care continuity for you. They are fully qualified to address your health concerns and develop a treatment plan. They communicate directly with your gastroenterologist to care for you. Seeing the Advanced Practice Practitioners on your physician's team can help you by facilitating care more promptly, often allowing for earlier appointments, access to diagnostic testing, procedures, and other specialty referrals.    I  appreciate the opportunity to care for you. Lupita Avram, MD, [facility]

## 2024-04-15 NOTE — Progress Notes (Signed)
 [name] 68 y.o. 1955-10-10 [id number]  Assessment & Plan:   Encounter Diagnoses  Name Primary?   Irritable bowel syndrome with both constipation and diarrhea Yes   Situational stress    I think this is most likely IBS.  Will await the pending stool studies and include calprotectin and lactoferrin.  Increase dicyclomine  to 20 mg daily.  She is to update using MyChart.  We can send in a 20 mg prescription as opposed to using to 10 mg capsules.    Subjective:   Prior endoscopic studies:    Colonoscopy 07/2023:   - A single non- bleeding colonic angiodysplastic lesion. Treated with argon plasma coagulation ( APC) . - Small suspected AVM ablated - One diminutive polyp in the sigmoid colon, removed with a cold snare. Resected and retrieved. - Diverticulosis in the sigmoid colon, in the descending colon and in the ascending colon. - The examined portion of the ileum was normal. - The examination was otherwise normal on direct and retroflexion views.   Polyp was a tubular adenoma.   EGD 06/05/2023:  - Z-line irregular, 35 cm from the incisors.  - 2 cm hiatal hernia. Two areas of focal erythema as described on 2021 exam also.  - A Nissen fundoplication was found. The wrap appears loose.  - The examination was otherwise normal.  - No specimens collected.   EGD 07/01/2019 as an inpatient due to melena by Dr. Golda:  - Normal esophagus.  - Z-line irregular, 35 cm from the incisors.  - 2 cm hiatal hernia with focal erythema to mucosa below GEJ.  - A Nissen fundoplication was found. The wrap appears loose.  - Normal duodenal bulb, second portion of the duodenum and area of the papilla.  - No specimens collected.    Colonoscopy 07/02/2019:  - One 5 mm polyp at the hepatic flexure, removed with a cold snare. Resected and  retrieved.  - Diverticulosis in the sigmoid colon, in the descending colon and in the transverse colon.    Colonoscopy 03/02/2020:  - Three 2 to 4 mm  polyps in the proximal transverse colon and at the hepatic flexure, removed  with a cold snare. Resected and retrieved.  - Mild pancolonic diverticulosis.  - Non-bleeding internal hemorrhoids.  - The examined portion of the ileum was normal.  - The examination was otherwise normal on direct and retroflexion views. No masses.  - 5 year colonoscopy recall  - TUBULAR ADENOMA(S)  - NEGATIVE FOR HIGH-GRADE DYSPLASIA OR MALIGNANCY    Small bowel capsule endoscopy 08/13/2011: Few scattered erosions otherwise normal study   EGD 08/12/2011 done as an inpatient secondary to GIB: Schatzki's ring Prior fundoplication   Colonoscopy 08/12/2011 done as an inpatient secondary to GIB: Diverticulosis in the transverse colon, descending and sigmoid colon    ----------------------------------------------------------------------------   Chief Complaint: Diarrhea  HPI 68 year old woman with a past medical history of anxiety, asthma, hypertension, nonobstructive CAD per cardiac cath 2018 and remote MI, peripheral arterial disease s/p L SFA stent 10/2014 and s/p left fem-pop bypass with graft 06/2016 with occlusion s/p thrombectomy with restenting 10/01/2022, CVA x 3, DM type II, gastroparesis, ovarian cancer, diverticulosis, IBS, presumed diverticular bleed 08/2011, GERD s/p Nissen Fundoplication 1996 and UGI bleed/melena 06/2019. Past cholecystectomy.    She was hospitalized in March for symptomatic anemia with melenic stools.  Hemoglobin from 12.5 g down to 8 g.  Symptomatic, episode of syncope.  CTA was negative.  Colonoscopy showed a small AVM which was treated  with APC and a polyp which was removed.    She has a history of IBS predominantly constipation.  Lately she has been having problems with diarrhea.  She called into the office after persistent symptoms and this appointment was made, plus she had stool studies through included GI pathogen panel PCR test, ova and parasite, lactoferrin and calprotectin.  The  PCR profile test was canceled as it was sent frozen.  Same for ova and parasite.  The lactoferrin and calprotectin are pending and are being run.  Dicyclomine  10 mg 3 times daily AC and at bedtime was sent in.  Patient consented to the use of artificial intelligence scribe   Diarrhea - Onset prior to Thanksgiving, ongoing for several weeks - Liquid consistency, occasionally soft, intermittently returns to liquid state - Frequency of three to four bowel movements per day - Difficulty reaching the bathroom in time - Nocturnal symptoms, with diarrhea sometimes waking her from sleep - Bowel movement consistency varies: watery, soft, mushy, occasionally almost normal - One episode of black stool, otherwise no visible blood  Abdominal cramps - Persistent abdominal cramps accompanying diarrhea - Severity prompted contact with clinic on December 4th  Response to therapy - Recently started dicyclomine , with partial improvement in symptoms but not fully effective yet - Previously used over-the-counter Imodium , taking approximately four tablets daily without significant relief  Psychosocial stressors - Experiencing stress related to employment at the [employer] - Currently off work due to relocation to a new building   Wt Readings from Last 3 Encounters:  04/15/24 133 lb (60.3 kg)  10/13/23 124 lb (56.2 kg)  09/18/23 129 lb (58.5 kg)    Allergies[1] Active Medications[2] Past Medical History:  Diagnosis Date   Allergic urticaria 07/10/2015   Allergy     Anemia    hx   Angioedema 07/10/2015   Anxiety    Arthritis    neck, left hand (09/14/2012)   Asthma    CAD (coronary artery disease)    MI 2003 and 2013   Cancer (HCC) 1985   ovarian, no treatment except surgery   Cataract    Complication of anesthesia    OCCASIONAL TROUBLE TURNING NECK TO RIGHT   Critical lower limb ischemia (HCC)    10/2014 s/p L SFA stenting   Diverticulosis of colon with hemorrhage 08/2011    GERD (gastroesophageal reflux disease)    GI bleed    H/O hiatal hernia    Hidradenitis    groin   History of blood transfusion 1985 AND 2013   Hyperlipidemia    Hypertension    Irritable bowel syndrome    Left-sided weakness    since stroke, left eye trouble seeing   Migraines    Neuropathy    Obesity    Osteoporosis    PAD (peripheral artery disease)    a. critical limb ischemia s/p PTA/stenting of L SFA 10/2014. c. occ prior SFA stent by angio 01/2016, for possible PV bypass.   Pneumonia    baby   Recurrent upper respiratory infection (URI)    S/P arterial stent-mid Lt SFA 11/03/14 11/04/2014   Schatzki's ring    Sinus problem    Stomach ulcer 1972   non-bleeding   Stroke (HCC) 07-2007, 07-2008, 07-2009   total 3 strokes; mild left sided weakness and left eye jumps.   Type II diabetes mellitus (HCC)    Vitamin B 12 deficiency 07/15/2013   Past Surgical History:  Procedure Laterality Date   ABDOMINAL AORTOGRAM W/LOWER EXTREMITY  Left 10/01/2022   Procedure: ABDOMINAL AORTOGRAM W/LOWER EXTREMITY;  Surgeon: Serene Gaile ORN, MD;  Location: [facility] IN[facility];  Service: Cardiovascular;  Laterality: Left;   ABDOMINAL HYSTERECTOMY  1985   ADENOIDECTOMY     ANTERIOR CERVICAL DECOMP/DISCECTOMY FUSION  2002   ANTERIOR CERVICAL DECOMP/DISCECTOMY FUSION N/A 04/08/2014   Procedure: Cervical Six-Seven ANTERIOR CERVICAL DECOMPRESSION/DISCECTOMY FUSION Plating and Bonegraft  2 LEVELS;  Surgeon: Rockey Peru, MD;  Location: [facility] NEURO ORS;  Service: Neurosurgery;  Laterality: N/A;  Cervical Six-Seven ANTERIOR CERVICAL DECOMPRESSION/DISCECTOMY FUSION Plating and Bonegraft  2 LEVELS   APPENDECTOMY  1985   AXILLARY HIDRADENITIS EXCISION  1990-2008   bilateral   BACK SURGERY     BREAST BIOPSY Right 2007   BREAST CYST EXCISION Right 2008   BREAST REDUCTION SURGERY     CARDIAC CATHETERIZATION  2004   mild disease   CATARACT EXTRACTION W/PHACO Left 05/16/2015   Procedure: CATARACT EXTRACTION PHACO  AND INTRAOCULAR LENS PLACEMENT (IOC);  Surgeon: Oneil Platts, MD;  Location: [facility];  Service: Ophthalmology;  Laterality: Left;  CDE: 4.24   CATARACT EXTRACTION W/PHACO Right 05/30/2015   Procedure: CATARACT EXTRACTION RIGHT EYE PHACO AND INTRAOCULAR LENS PLACEMENT ;  Surgeon: Oneil Platts, MD;  Location: [facility];  Service: Ophthalmology;  Laterality: Right;  CDE:4.08   CHOLECYSTECTOMY  1990's   COLONOSCOPY  08/12/2011   Procedure: COLONOSCOPY;  Surgeon: Gwendlyn ONEIDA Buddy, MD,FACG;  Location[facility]COPY;  Service: Endoscopy;  Laterality: N/A;   COLONOSCOPY  06/19/2006   COLONOSCOPY N/A 07/21/2023   Procedure: COLONOSCOPY;  Surgeon: Avram Lupita BRAVO, MD;  Location: [facility] ENDOSCOPY;  Service: Gastroenterology;  Laterality: N/A;   COLONOSCOPY  07/21/2023   Procedure: COLONOSCOPY, WITH ARGON PLASMA COAGULATION;  Surgeon: Avram Lupita BRAVO, MD;  Location: [facility] ENDOSCOPY;  Service: Gastroenterology;;   COLONOSCOPY WITH PROPOFOL  N/A 07/02/2019   Procedure: COLONOSCOPY WITH PROPOFOL ;  Surgeon: Golda Claudis PENNER, MD;  Location: [facility];  Service: Endoscopy;  Laterality: N/A;   cyst thigh Right    ENTEROSCOPY N/A 07/18/2023   Procedure: ENTEROSCOPY;  Surgeon: Charlanne Groom, MD;  Location: [facility] ENDOSCOPY;  Service: Gastroenterology;  Laterality: N/A;  ultraslim scope   ESOPHAGOGASTRODUODENOSCOPY  08/12/2011   Procedure: ESOPHAGOGASTRODUODENOSCOPY (EGD);  Surgeon: Gwendlyn ONEIDA Buddy, MD,FACG;  Location: [facility] ENDOSCOPY;  Service: Endoscopy;  Laterality: N/A;   ESOPHAGOGASTRODUODENOSCOPY  06/04/2005   ESOPHAGOGASTRODUODENOSCOPY (EGD) WITH PROPOFOL  N/A 07/01/2019   Procedure: ESOPHAGOGASTRODUODENOSCOPY (EGD) WITH PROPOFOL ;  Surgeon: Golda Claudis PENNER, MD;  [facility]: [facility];  Service: Endoscopy;  Laterality: N/A;   FEMORAL-POPLITEAL BYPASS GRAFT Left 06/19/2016   Procedure: Left Leg BYPASS GRAFT FEMORAL-POPLITEAL ARTERY;  Surgeon: Gaile ORN Serene, MD;  Location: [facility] OR;  Service: Vascular;  Laterality: Left;   GIVENS CAPSULE  STUDY  08/13/2011   Procedure: GIVENS CAPSULE STUDY;  Surgeon: Gwendlyn ONEIDA Buddy, MD,FACG;  Location: [facility] ENDOSCOPY;  Service: Endoscopy;  Laterality: N/A;   HAMMER TOE SURGERY Bilateral ~ 2000   HEMOSTASIS CLIP PLACEMENT  07/02/2019   Procedure: HEMOSTASIS CLIP PLACEMENT;  Surgeon: Golda Claudis PENNER, MD;  [facility]: [facility];  Service: Endoscopy;;  hepatic flexure   HIATAL HERNIA REPAIR     HYDRADENITIS EXCISION  01/2011; 03/2012   'groin and abdomen; 03/2012 (09/14/2012)   HYDRADENITIS EXCISION  04/01/2012   Procedure: EXCISION HYDRADENITIS GROIN;  Surgeon: Donnice KATHEE Lunger, MD;  Location: [facility] OR[facility];  Service: General;  Laterality: Bilateral;  Excision of Hydradenitis of Perineum   HYDRADENITIS EXCISION N/A 09/17/2013   Procedure: EXCISION  PERINEAL HIDRADENITIS ;  Surgeon: Donnice KATHEE Lunger, MD;  Location: [facility];  Service: General;  Laterality: N/A;  also in the pubis area   LEFT HEART CATH AND CORONARY ANGIOGRAPHY N/A 12/26/2016   Procedure: LEFT HEART CATH AND CORONARY ANGIOGRAPHY;  Surgeon: Jordan, Peter M, MD;  Location: [facility] INVASIVE CV LAB;  Service: Cardiovascular;  Laterality: N/A;   MASS EXCISION Right 09/17/2013   Procedure: EXCISION MASS;  Surgeon: Donnice KATHEE Lunger, MD;  Location: [facility] ORS;  Service: General;  Laterality: Right;   NISSEN FUNDOPLICATION  1996   PERIPHERAL VASCULAR CATHETERIZATION N/A 11/03/2014   Procedure: Lower Extremity Angiography;  Surgeon: Dorn JINNY Lesches, MD;  [facility]: [facility];  Service: Cardiovascular;  Laterality: N/A;   PERIPHERAL VASCULAR CATHETERIZATION N/A 01/29/2016   Procedure: Lower Extremity Angiography;  Surgeon: Dorn JINNY Lesches, MD;  Location: [facility] INVASIVE CV LAB;  Service: Cardiovascular;  Laterality: N/A;   PERIPHERAL VASCULAR INTERVENTION Left 10/01/2022   Procedure: PERIPHERAL VASCULAR INTERVENTION;  Surgeon: Serene Gaile ORN, MD;  Location: [facility];  Service: Cardiovascular;  Laterality: Left;  fem-pop bypass   PERIPHERAL VASCULAR  THROMBECTOMY Left 10/01/2022   Procedure: PERIPHERAL VASCULAR THROMBECTOMY;  Surgeon: Serene Gaile ORN, MD;  Location: [facility];  Service: Cardiovascular;  Laterality: Left;  fem-pop bypass   POLYPECTOMY  07/02/2019   Procedure: POLYPECTOMY;  Surgeon: Golda Claudis PENNER, MD;  Location: [facility];  Service: Endoscopy;;   POLYPECTOMY  07/21/2023   Procedure: POLYPECTOMY;  Surgeon: Avram Lupita BRAVO, MD;  Location: [facility];  Service: Gastroenterology;;   POSTERIOR LUMBAR FUSION  2008 X 2   REDUCTION MAMMAPLASTY  1996?   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   Review of Systems As per HPI  Objective:   Physical Exam @BP  (!) 108/56   Pulse 80   Ht 5' 3 (1.6 m)   Wt 133 lb (60.3 kg)   SpO2 98%   BMI 23.56 kg/m @  General:  NAD Eyes:   anicteric Lungs:  clear Heart::  S1S2 no rubs, murmurs or gallops Abdomen:  soft and mildly, diffusely tender, BS+ Ext:   no edema, cyanosis or clubbing    Data Reviewed:  See HPI and  summary    [1]  Allergies Allergen Reactions   Sulfa  Antibiotics Shortness Of Breath and Palpitations   Codeine Other (See Comments)    Recovering Addict does not like to take Narcotics   Fish Allergy  Hives and Swelling    Tongue swelling   Iodine  Swelling   Metformin  And Related Diarrhea  Soliqua [Insulin  Glargine-Lixisenatide] Itching and Other (See Comments)    tongue swelling   Shellfish Allergy  Swelling and Rash    Tongue swelling  [2]  Current Meds  Medication Sig   albuterol  (PROAIR  HFA) 108 (90 Base) MCG/ACT inhaler 1-2 inhalations every 4-6 hours as needed for cough or wheeze.   ALPRAZolam  (XANAX ) 1 MG tablet Take 1 mg by mouth 2 (two) times daily as needed for anxiety.   aspirin  EC 81 MG tablet Take 81 mg by mouth daily.   Azelastine  HCl 0.15 % SOLN Place 2 sprays into both nostrils 2 (two) times daily.   bisacodyl  (DULCOLAX) 5 MG EC tablet Take 5 mg by mouth daily as needed for moderate constipation.   clopidogrel  (PLAVIX ) 75 MG tablet TAKE 1 TABLET(75 MG) BY MOUTH DAILY   Continuous Glucose Sensor (DEXCOM G7 SENSOR) MISC Change sensor every 10 days   dicyclomine  (BENTYL ) 10 MG capsule Take 1 capsule (10 mg total) by mouth 4 (four) times daily -  before meals and at bedtime. Take 1 capsule 20-30 minutes before meals and at bedtime.   doxycycline  (VIBRAMYCIN ) 100 MG capsule Take 1 capsule (100 mg total) by mouth 2 (two) times daily. (Patient taking differently: Take 100 mg by mouth 2 (two) times daily. As needed)   EPINEPHrine  0.3 mg/0.3 mL IJ SOAJ injection Inject 0.3 mLs (0.3 mg total) into the muscle once.   estradiol (ESTRACE) 0.1 MG/GM vaginal cream Place 1 Applicatorful vaginally daily. (Patient taking differently: Place 1 Applicatorful vaginally daily. As needed)   famotidine  (PEPCID ) 40 MG tablet Take 1 tablet (40 mg total) by mouth 2 (two) times daily.   ferrous gluconate (FERGON) 324 MG tablet Take 324 mg by mouth daily with breakfast.   hydrocortisone   (ANUSOL -HC) 2.5 % rectal cream Place rectally daily as needed for hemorrhoids.   Insulin  Disposable Pump (OMNIPOD 5 G7 INTRO, GEN 5,) KIT 1 Device by Does not apply route every other day.   Insulin  Disposable Pump (OMNIPOD 5 G7 PODS, GEN 5,) MISC 1 Device by Does not apply route every other day.   insulin  lispro (HUMALOG ) 100 UNIT/ML injection Max daily 60 units   linaclotide  (LINZESS ) 145 MCG CAPS capsule Take 1 capsule (145 mcg total) by mouth daily before breakfast. (Patient taking differently: Take 145 mcg by mouth daily before breakfast. As needed)   losartan -hydrochlorothiazide  (HYZAAR) 50-12.5 MG tablet Take 1 tablet by mouth daily.   montelukast  (SINGULAIR ) 10 MG tablet Take 10 mg by mouth at bedtime as needed (allergies).   mupirocin  cream (BACTROBAN ) 2 % Apply topically.   nitroGLYCERIN  (NITROSTAT ) 0.4 MG SL tablet PLACE 1 TAB UNDER TONGUE EVERY 5 MINS AS NEEDED FOR CHEST PAIN - MAX 3 DOSES THEN 911   nortriptyline  (PAMELOR ) 10 MG capsule Take 1 capsule (10 mg total) by mouth at bedtime.   ondansetron  (ZOFRAN ) 4 MG tablet Take 1 tablet (4 mg total) by mouth every 6 (six) hours as needed for nausea.   pantoprazole  (PROTONIX ) 40 MG tablet Take 1 tablet (40 mg total) by mouth 2 (two) times daily before a meal.   polyethylene glycol (MIRALAX  / GLYCOLAX ) 17 g packet Take 17 g by mouth daily as needed for moderate constipation or severe constipation.   potassium chloride  (KLOR-CON ) 10 MEQ tablet Take 10 mEq by mouth 2 (two) times daily.   rosuvastatin  (CRESTOR ) 40 MG tablet Take 1 tablet (40 mg total) by mouth daily.   SUMAtriptan  (IMITREX ) 100 MG tablet Take 100 mg by mouth every 2 (  two) hours as needed for migraine.

## 2024-04-16 ENCOUNTER — Ambulatory Visit: Payer: Self-pay | Admitting: *Deleted

## 2024-04-16 ENCOUNTER — Other Ambulatory Visit: Payer: Self-pay | Admitting: *Deleted

## 2024-04-16 DIAGNOSIS — R197 Diarrhea, unspecified: Secondary | ICD-10-CM

## 2024-04-17 LAB — OVA AND PARASITE EXAMINATION
CONCENTRATE RESULT:: NONE SEEN
MICRO NUMBER:: 17326204
SPECIMEN QUALITY:: ADEQUATE
TRICHROME RESULT:: NONE SEEN

## 2024-04-17 LAB — GASTROINTESTINAL PATHOGEN PNL

## 2024-04-17 LAB — FECAL LACTOFERRIN, QUANT
Fecal Lactoferrin: NEGATIVE
MICRO NUMBER:: 17343441
SPECIMEN QUALITY:: ADEQUATE

## 2024-04-17 LAB — TIQ-NTM

## 2024-04-18 LAB — CALPROTECTIN: Calprotectin: 75 ug/g

## 2024-04-19 NOTE — Telephone Encounter (Signed)
 PT is returning call to let us  know that the new medications she started has now caused her to be constipated and she cannot turn in a stool sample. Requesting to speak with a nurse regarding her symptoms. Please advise.

## 2024-04-22 ENCOUNTER — Other Ambulatory Visit

## 2024-04-22 DIAGNOSIS — R197 Diarrhea, unspecified: Secondary | ICD-10-CM

## 2024-04-26 ENCOUNTER — Ambulatory Visit: Payer: Self-pay | Admitting: Physician Assistant

## 2024-04-26 LAB — GI PROFILE, STOOL, PCR

## 2024-05-11 ENCOUNTER — Encounter: Payer: Self-pay | Admitting: Internal Medicine

## 2024-05-11 ENCOUNTER — Ambulatory Visit: Admitting: Internal Medicine

## 2024-05-11 VITALS — BP 132/78 | Ht 63.0 in | Wt 137.0 lb

## 2024-05-11 DIAGNOSIS — R809 Proteinuria, unspecified: Secondary | ICD-10-CM | POA: Diagnosis not present

## 2024-05-11 DIAGNOSIS — Z794 Long term (current) use of insulin: Secondary | ICD-10-CM

## 2024-05-11 DIAGNOSIS — E1129 Type 2 diabetes mellitus with other diabetic kidney complication: Secondary | ICD-10-CM

## 2024-05-11 DIAGNOSIS — E1159 Type 2 diabetes mellitus with other circulatory complications: Secondary | ICD-10-CM | POA: Diagnosis not present

## 2024-05-11 DIAGNOSIS — E1142 Type 2 diabetes mellitus with diabetic polyneuropathy: Secondary | ICD-10-CM | POA: Diagnosis not present

## 2024-05-11 DIAGNOSIS — E785 Hyperlipidemia, unspecified: Secondary | ICD-10-CM

## 2024-05-11 LAB — POCT GLYCOSYLATED HEMOGLOBIN (HGB A1C): Hemoglobin A1C: 7.1 % — AB (ref 4.0–5.6)

## 2024-05-11 MED ORDER — OMNIPOD 5 G7 PODS (GEN 5) MISC: 1.0000 | 3 refills | Status: AC

## 2024-05-11 MED ORDER — DEXCOM G7 SENSOR MISC: 3 refills | Status: AC

## 2024-05-11 MED ORDER — INSULIN LISPRO 100 UNIT/ML IJ SOLN: INTRAMUSCULAR | 3 refills | Status: AC

## 2024-05-11 NOTE — Patient Instructions (Signed)

## 2024-05-11 NOTE — Progress Notes (Unsigned)
 "   Name: MYKAL KIRCHMAN  Age/ Sex: 69 y.o., female   MRN/ DOB: [id number], [birth year]     PCP: Sheldon Netter, PA   Reason for Endocrinology Evaluation: Type 2 Diabetes Mellitus  Initial Endocrine Consultative Visit: 04/17/2020    PATIENT IDENTIFIER: Ms. ARYANI DAFFERN is a 69 y.o. female with a past medical history of T2DM, PAD ,CAD, HTN and Hx of pancreatitis  . The patient has followed with Endocrinology clinic since 04/17/2020 for consultative assistance with management of her diabetes.  DIABETIC HISTORY:  Ms. Vandall was diagnosed with DM in 2003, She is intolerant to Metformin  due to diarrhea and Soliqua due to itching . Her hemoglobin A1c has ranged from 7.1%  in 20, peaking at 9.5% in 2015  Has chronic GI issues - Dr Avram   Islet Ab's and GAD-65 negative   Jardiance  started 07/2020, stopped 02/2023 due to recurrent yeast infections Omnipod started 08/2020   SUBJECTIVE:   During the last visit (10/13/2023) :  A1c 6.3%   Today (05/11/2024): Ms. Rigsbee is here for diabetes follow up .  She checks her blood sugars multiple times a day through CGM. The patient has had hypoglycemic episodes since the last clinic visit.   She continues to follow-up with Dr. Avram for diarrhea, suspect IBS She follows with [employer] for anemia  No nausea  Has noted right foot pain for a long time. Has mild tingling of right foot as well. Has hx of PVD .Hasn't had a recent follow with vascular   Patient has been stressed out, she is looking for a different job than her current job at the [employer]   This patient with type 2 diabetes is treated with Omnipod (insulin  pump). During the visit the pump basal and bolus doses were reviewed including carb/insulin  rations and supplemental doses. The clinical list was updated. The glucose meter download was reviewed in detail to determine if the current pump settings are providing the best glycemic control without excessive  hypoglycemia.   Pump and meter download:    Pump   Omnipod Settings   Insulin  type   Humalog     Basal rate       0000  1.20 u/h    0400 1.35   0800 1.55   1200 1.60      I:C ratio       0000 1:3                  Sensitivity       0000  20      Goal       0000  110           Type & Model of Pump: Omnipod Insulin  Type: Currently using Humalog  .     PUMP STATISTICS: Average BG: 174 Average Daily Carbs (g):56.8 Average Total Daily Insulin :57.7 Average Daily Basal: 38 (66 %) Average Daily Bolus: 19.7(34 %)   CONTINUOUS GLUCOSE MONITORING RECORD INTERPRETATION    Dates of Recording: 12/24-05/11/2024  Sensor description:dexcom  Results statistics:   CGM use % of time 82.8  Average and SD 174/52  Time in range 58%  % Time Above 180 33  % Time above 250 9  % Time Below target 0   Glycemic patterns summary: BGs trend down at night to optimal and fluctuate during the day Hyperglycemic episodes postprandial  Hypoglycemic episodes occurred sporadic  Overnight periods: Optimal  HOME DIABETES REGIMEN:  Humalog      Statin: yes  ACE-I/ARB: yes   DIABETIC COMPLICATIONS: Microvascular complications:  Neuropathy  Denies: CKD, retinopathy Last Eye Exam: Completed 12/27/2021  Macrovascular complications:  PVD ( S/P femoral-popliteal bypass), Mild CAD  Denies: CAD, CVA   HISTORY:  Past Medical History:  Past Medical History:  Diagnosis Date   Allergic urticaria 07/10/2015   Allergy     Anemia    hx   Angioedema 07/10/2015   Anxiety    Arthritis    neck, left hand (09/14/2012)   Asthma    CAD (coronary artery disease)    MI 2003 and 2013   Cancer (HCC) 1985   ovarian, no treatment except surgery   Cataract    Complication of anesthesia    OCCASIONAL TROUBLE TURNING NECK TO RIGHT   Critical lower limb ischemia (HCC)    10/2014 s/p L SFA stenting   Diverticulosis of colon with hemorrhage 08/2011   GERD (gastroesophageal reflux disease)     GI bleed    H/O hiatal hernia    Hidradenitis    groin   History of blood transfusion 1985 AND 2013   Hyperlipidemia    Hypertension    Irritable bowel syndrome    Left-sided weakness    since stroke, left eye trouble seeing   Migraines    Neuropathy    Obesity    Osteoporosis    PAD (peripheral artery disease)    a. critical limb ischemia s/p PTA/stenting of L SFA 10/2014. c. occ prior SFA stent by angio 01/2016, for possible PV bypass.   Pneumonia    baby   Recurrent upper respiratory infection (URI)    S/P arterial stent-mid Lt SFA 11/03/14 11/04/2014   Schatzki's ring    Sinus problem    Stomach ulcer 1972   non-bleeding   Stroke (HCC) 07-2007, 07-2008, 07-2009   total 3 strokes; mild left sided weakness and left eye jumps.   Type II diabetes mellitus (HCC)    Vitamin B 12 deficiency 07/15/2013   Past Surgical History:  Past Surgical History:  Procedure Laterality Date   ABDOMINAL AORTOGRAM W/LOWER EXTREMITY Left 10/01/2022   Procedure: ABDOMINAL AORTOGRAM W/LOWER EXTREMITY;  Surgeon: Serene Gaile ORN, MD;  [facility]: [facility];  Service: Cardiovascular;  Laterality: Left;   ABDOMINAL HYSTERECTOMY  1985   ADENOIDECTOMY     ANTERIOR CERVICAL DECOMP/DISCECTOMY FUSION  2002   ANTERIOR CERVICAL DECOMP/DISCECTOMY FUSION N/A 04/08/2014   Procedure: Cervical Six-Seven ANTERIOR CERVICAL DECOMPRESSION/DISCECTOMY FUSION Plating and Bonegraft  2 LEVELS;  Surgeon: Rockey Peru, MD;  Location: [facility];  Service: Neurosurgery;  Laterality: N/A;  Cervical Six-Seven ANTERIOR CERVICAL DECOMPRESSION/DISCECTOMY FUSION Plating and Bonegraft  2 LEVELS   APPENDECTOMY  1985   AXILLARY HIDRADENITIS EXCISION  1990-2008   bilateral   BACK SURGERY     BREAST BIOPSY Right 2007   BREAST CYST EXCISION Right 2008   BREAST REDUCTION SURGERY     CARDIAC CATHETERIZATION  2004   mild disease   CATARACT EXTRACTION W/PHACO Left 05/16/2015   Procedure: CATARACT EXTRACTION PHACO AND  INTRAOCULAR LENS PLACEMENT (IOC);  Surgeon: Oneil Platts, MD;  Location: [facility];  Service: Ophthalmology;  Laterality: Left;  CDE: 4.24   CATARACT EXTRACTION W/PHACO Right 05/30/2015   Procedure: CATARACT EXTRACTION RIGHT EYE PHACO AND INTRAOCULAR LENS PLACEMENT ;  Surgeon: Oneil Platts, MD;  Location: [facility];  Service: Ophthalmology;  Laterality: Right;  CDE:4.08   CHOLECYSTECTOMY  1990's   COLONOSCOPY  08/12/2011   Procedure: COLONOSCOPY;  Surgeon: Gwendlyn ONEIDA Buddy, MD,FACG;  Location: [facility];  Service: Endoscopy;  Laterality: N/A;   COLONOSCOPY  06/19/2006   COLONOSCOPY N/A 07/21/2023   Procedure: COLONOSCOPY;  Surgeon: Avram Lupita BRAVO, MD;  Location: [facility];  Service: Gastroenterology;  Laterality: N/A;   COLONOSCOPY  07/21/2023   Procedure: COLONOSCOPY, WITH ARGON PLASMA COAGULATION;  Surgeon: Avram Lupita BRAVO, MD;  Location: [facility];  Service: Gastroenterology;;   COLONOSCOPY WITH PROPOFOL  N/A 07/02/2019   Procedure: COLONOSCOPY WITH PROPOFOL ;  Surgeon: Golda Claudis PENNER, MD;  [facility]: [facility];  Service: Endoscopy;  Laterality: N/A;   cyst thigh Right    ENTEROSCOPY N/A 07/18/2023   Procedure: ENTEROSCOPY;  Surgeon: Charlanne Groom, MD;  Location: [facility];  Service: Gastroenterology;  Laterality: N/A;  ultraslim scope   ESOPHAGOGASTRODUODENOSCOPY  08/12/2011   Procedure: ESOPHAGOGASTRODUODENOSCOPY (EGD);  Surgeon: Gwendlyn ONEIDA Buddy, MD,FACG;  Location[facility];  Service: Endoscopy;  Laterality: N/A;   ESOPHAGOGASTRODUODENOSCOPY  06/04/2005   ESOPHAGOGASTRODUODENOSCOPY (EGD) WITH PROPOFOL  N/A 07/01/2019   Procedure: ESOPHAGOGASTRODUODENOSCOPY (EGD) WITH PROPOFOL ;  Surgeon: Golda Claudis PENNER, MD;  [facility];  Service: Endoscopy;  Laterality: N/A;   FEMORAL-POPLITEAL BYPASS GRAFT Left 06/19/2016   Procedure: Left Leg BYPASS GRAFT FEMORAL-POPLITEAL ARTERY;  Surgeon: Gaile LELON New, MD;  Location: [facility];  Service: Vascular;  Laterality: Left;   GIVENS CAPSULE STUDY   08/13/2011   Procedure: GIVENS CAPSULE STUDY;  Surgeon: Gwendlyn ONEIDA Buddy, MD,FACG;  Location: [facility];  Service: Endoscopy;  Laterality: N/A;   HAMMER TOE SURGERY Bilateral ~ 2000   HEMOSTASIS CLIP PLACEMENT  07/02/2019   Procedure: HEMOSTASIS CLIP PLACEMENT;  Surgeon: Golda Claudis PENNER, MD;  [facility]: [facility];  Service: Endoscopy;;  hepatic flexure   HIATAL HERNIA REPAIR     HYDRADENITIS EXCISION  01/2011; 03/2012   'groin and abdomen; 03/2012 (09/14/2012)   HYDRADENITIS EXCISION  04/01/2012   Procedure: EXCISION HYDRADENITIS GROIN;  Surgeon: Donnice KATHEE Lunger, MD;  Location: [facility];  Service: General;  Laterality: Bilateral;  Excision of Hydradenitis of Perineum   HYDRADENITIS EXCISION N/A 09/17/2013   Procedure: EXCISION PERINEAL HIDRADENITIS ;  Surgeon: Donnice KATHEE Lunger, MD;  Location: [facility];  Service: General;  Laterality: N/A;  also in the pubis area   LEFT HEART CATH AND CORONARY ANGIOGRAPHY N/A 12/26/2016   Procedure: LEFT HEART CATH AND CORONARY ANGIOGRAPHY;  Surgeon: Jordan, Peter M, MD;  Location: [facility] INVASIVE CV LAB;  Service: Cardiovascular;  Laterality: N/A;   MASS EXCISION Right 09/17/2013   Procedure: EXCISION MASS;  Surgeon: Donnice KATHEE Lunger, MD;  Location: [facility];  Service: General;  Laterality: Right;   NISSEN FUNDOPLICATION  1996   PERIPHERAL VASCULAR CATHETERIZATION N/A 11/03/2014   Procedure: Lower Extremity Angiography;  Surgeon: Dorn JINNY Lesches, MD;  [facility];  Service: Cardiovascular;  Laterality: N/A;   PERIPHERAL VASCULAR CATHETERIZATION N/A 01/29/2016   Procedure: Lower Extremity Angiography;  Surgeon: Dorn JINNY Lesches, MD;  Location: [facility] INVASIVE CV LAB;  Service: Cardiovascular;  Laterality: N/A;   PERIPHERAL VASCULAR INTERVENTION Left 10/01/2022   Procedure: PERIPHERAL VASCULAR INTERVENTION;  Surgeon: New Gaile LELON, MD;  [facility]: [facility];  Service: Cardiovascular;  Laterality: Left;  fem-pop bypass   PERIPHERAL VASCULAR  THROMBECTOMY Left 10/01/2022   Procedure: PERIPHERAL VASCULAR THROMBECTOMY;  Surgeon: New Gaile LELON, MD;  [facility]: [facility];  Service: Cardiovascular;  Laterality: Left;  fem-pop bypass   POLYPECTOMY  07/02/2019   Procedure: POLYPECTOMY;  Surgeon: Golda Claudis PENNER, MD;  Location: [facility];  Service: Endoscopy;;   POLYPECTOMY  07/21/2023   Procedure: POLYPECTOMY;  Surgeon: Avram Lupita BRAVO, MD;  Location: [facility];  Service: Gastroenterology;;   POSTERIOR LUMBAR FUSION  2008 X 2   REDUCTION MAMMAPLASTY  1996?   TONSILLECTOMY AND ADENOIDECTOMY  1959 AND 2000   UVULOPALATOPHARYNGOPLASTY, TONSILLECTOMY AND SEPTOPLASTY  2000's   Social History:  reports that she quit smoking about 4 years ago. Her smoking use included cigarettes. She started smoking about 45 years ago. She has a 20.5 pack-year smoking history. She has never used smokeless tobacco. She reports that she does not drink alcohol and does not use drugs. Family History:  Family History  Problem Relation Age of Onset   Breast cancer Mother    Heart disease Mother    Hypertension Mother    Diabetes Father    Heart disease Father    Stroke Father    Heart disease Sister    Hypertension Sister    Hypertension Sister    Hyperlipidemia Sister    Diabetes Sister    Allergic rhinitis Sister    Emphysema Other        great uncle   Aneurysm Sister        brain   Colon cancer Paternal Uncle    Angioedema Neg Hx    Asthma Neg Hx    Eczema Neg Hx    Immunodeficiency Neg Hx    Urticaria Neg Hx      HOME MEDICATIONS: Allergies as of 05/11/2024       Reactions   Sulfa  Antibiotics Shortness Of Breath, Palpitations   Codeine Other (See Comments)   Recovering Addict does not like to take Narcotics   Fish Allergy  Hives, Swelling   Tongue swelling   Iodine  Swelling   Metformin  And Related Diarrhea   Soliqua [insulin  Glargine-lixisenatide] Itching, Other (See Comments)   tongue swelling   Shellfish Allergy   Swelling, Rash   Tongue swelling        Medication List        Accurate as of May 11, 2024  2:32 PM. If you have any questions, ask your nurse or doctor.          albuterol  108 (90 Base) MCG/ACT inhaler Commonly known as: ProAir  HFA 1-2 inhalations every 4-6 hours as needed for cough or wheeze.   ALPRAZolam  1 MG tablet Commonly known as: XANAX  Take 1 mg by mouth 2 (two) times daily as needed for anxiety.   aspirin  EC 81 MG tablet Take 81 mg by mouth daily.   Azelastine  HCl 0.15 % Soln Place 2 sprays into both nostrils 2 (two) times daily.   bisacodyl  5 MG EC tablet Commonly known as: DULCOLAX Take 5 mg by mouth daily as needed for moderate constipation.   clopidogrel  75 MG tablet Commonly known as: PLAVIX  TAKE 1 TABLET(75 MG) BY MOUTH DAILY   Dexcom G7 Sensor Misc Change sensor every 10 days   dicyclomine  10 MG capsule Commonly known as: BENTYL  Take 2 capsules (20 mg total) by mouth 4 (four) times daily -  before meals and at bedtime. Take 1 capsule 20-30 minutes before meals and at bedtime.   doxycycline  100 MG capsule Commonly known as: VIBRAMYCIN  Take 1 capsule (100 mg total) by mouth 2 (two) times daily. What changed: additional instructions   EPINEPHrine  0.3 mg/0.3 mL Soaj injection Commonly known as: EPI-PEN Inject 0.3 mLs (0.3 mg total) into the muscle once.   estradiol 0.1 MG/GM vaginal cream Commonly known as:  ESTRACE Place 1 Applicatorful vaginally daily. What changed: additional instructions   famotidine  40 MG tablet Commonly known as: PEPCID  Take 1 tablet (40 mg total) by mouth 2 (two) times daily.   ferrous gluconate 324 MG tablet Commonly known as: FERGON Take 324 mg by mouth daily with breakfast.   hydrocortisone  2.5 % rectal cream Commonly known as: ANUSOL -HC Place rectally daily as needed for hemorrhoids.   insulin  lispro 100 UNIT/ML injection Commonly known as: HUMALOG  Max daily 60 units   linaclotide  145 MCG Caps  capsule Commonly known as: LINZESS  Take 1 capsule (145 mcg total) by mouth daily before breakfast. What changed: additional instructions   losartan -hydrochlorothiazide  50-12.5 MG tablet Commonly known as: Hyzaar Take 1 tablet by mouth daily.   lubiprostone  8 MCG capsule Commonly known as: AMITIZA  Take 1 capsule (8 mcg total) by mouth 2 (two) times daily with a meal. What changed:  when to take this reasons to take this   Magnesium  200 MG Tabs Take 1 tablet (200 mg total) by mouth daily.   montelukast  10 MG tablet Commonly known as: SINGULAIR  Take 10 mg by mouth at bedtime as needed (allergies).   mupirocin  cream 2 % Commonly known as: BACTROBAN  Apply topically.   nitroGLYCERIN  0.4 MG SL tablet Commonly known as: NITROSTAT  PLACE 1 TAB UNDER TONGUE EVERY 5 MINS AS NEEDED FOR CHEST PAIN - MAX 3 DOSES THEN 911   nortriptyline  10 MG capsule Commonly known as: PAMELOR  Take 1 capsule (10 mg total) by mouth at bedtime.   Omnipod 5 G7 Pods (Gen 5) Misc 1 Device by Does not apply route every other day.   Omnipod 5 G7 Intro (Gen 5) Kit 1 Device by Does not apply route every other day.   ondansetron  4 MG tablet Commonly known as: ZOFRAN  Take 1 tablet (4 mg total) by mouth every 6 (six) hours as needed for nausea.   pantoprazole  40 MG tablet Commonly known as: PROTONIX  Take 1 tablet (40 mg total) by mouth 2 (two) times daily before a meal.   polyethylene glycol 17 g packet Commonly known as: MIRALAX  / GLYCOLAX  Take 17 g by mouth daily as needed for moderate constipation or severe constipation.   potassium chloride  10 MEQ tablet Commonly known as: KLOR-CON  Take 10 mEq by mouth 2 (two) times daily.   rosuvastatin  40 MG tablet Commonly known as: CRESTOR  Take 1 tablet (40 mg total) by mouth daily.   SUMAtriptan  100 MG tablet Commonly known as: IMITREX  Take 100 mg by mouth every 2 (two) hours as needed for migraine.         OBJECTIVE:   Vital Signs: BP 132/78    Ht 5' 3 (1.6 m)   Wt 137 lb (62.1 kg)   BMI 24.27 kg/m   Wt Readings from Last 3 Encounters:  05/11/24 137 lb (62.1 kg)  04/15/24 133 lb (60.3 kg)  10/13/23 124 lb (56.2 kg)     Exam: General: Pt appears well and is in NAD  Lungs: Clear with good BS bilat   Heart: RRR   Extremities: No pretibial edema.  Neuro: MS is good with appropriate affect, pt is alert and Ox3      DM foot exam: 05/11/2024   The skin of the feet is intact without sores or ulcerations. The pedal pulses undetectable on today's exam The sensation is intact to a screening 5.07, 10 gram monofilament bilaterally      DATA REVIEWED:  Latest Reference Range & Units 07/23/23 04:11  Sodium  135 - 145 mmol/L 136  Potassium 3.5 - 5.1 mmol/L 3.2 (L)  Chloride 98 - 111 mmol/L 110  CO2 22 - 32 mmol/L 19 (L)  Glucose 70 - 99 mg/dL 847 (H)  BUN 8 - 23 mg/dL 12  Creatinine 9.55 - 8.99 mg/dL 9.23  Calcium  8.9 - 10.3 mg/dL 8.9  Anion gap 5 - 15  7  Phosphorus 2.5 - 4.6 mg/dL 2.2 (L)  Magnesium  1.7 - 2.4 mg/dL 2.1  GFR, Estimated >39 mL/min >60      Glutamic Acid Decarb Ab <5 IU/mL <5     ISLET CELL ANTIBODY SCREEN Negative    Old records , labs and images have been reviewed.    ASSESSMENT / PLAN / RECOMMENDATIONS:   1) Type 2 Diabetes Mellitus, Optimally  controlled, with neuropathic and macrovascular complications and microalbuminuria  - Most recent A1c of 7.1%. Goal A1c < 7.0 %.    -A1c continues to be optimal -Intolerant to Jardiance  due to recurrent yeast infections -Has Hx of pancreatitis in 2010, so DPP-4 inhibitors and GLp-1 agonists are CONTRAINDICATED - GAD-65 and Islet cell Ab's negative  - She has been noted with hypoglycemia overnight, I will increase her basal rate slightly - She was encouraged to continue to enter carbohydrates not just with meals but also with snacks, patient also educated on entering high BG's into the pump that way she would receive a correction  bolus  MEDICATIONS:  Humalog       Pump   Omnipod Settings   Insulin  type   Humalog     Basal rate       0000  1.25 u/h    0400 1.35   0800 1.55   1200 1.60      I:C ratio       0000 1:3                  Sensitivity       0000  20      Goal       0000  110         EDUCATION / INSTRUCTIONS: BG monitoring instructions: Patient is instructed to check her blood sugars 4 times a day, before meals and bedtime . Call [facility] if: BG persistently < 70  I reviewed the Rule of 15 for the treatment of hypoglycemia in detail with the patient. Literature supplied.   2) Diabetic complications:  Eye: Does not have known diabetic retinopathy.  Neuro/ Feet: Does  have known diabetic peripheral neuropathy .  Renal: Patient does not have known baseline CKD.    3) Dyslipidemia:  - LDL has been at goal  - No changes     Medication Continue rosuvastatin  40 mg daily    4) Microalbuminuria :   -  MA/Cr ratio slightly elevated at 48 in February 2023, repeat 2024 shows normalization  - She is on Losartan - HCTZ    5) Peripheral vascular disease  -I have encouraged the patient to follow-up with vascular surgery as she is past due  F/U 6 months    Signed electronically by: Stefano Redgie Butts, MD  [facility]  [facility] [address] [place], [place] [address] Phone: [phone] FAX: [phone]   CC: Sheldon Netter, PA [address] [place] [address] Phone: [phone]  Fax: [phone]  Return to Endocrinology clinic as below: No future appointments.     "

## 2024-05-12 ENCOUNTER — Other Ambulatory Visit: Payer: Self-pay

## 2024-05-12 ENCOUNTER — Encounter: Payer: Self-pay | Admitting: Cardiovascular Disease

## 2024-05-12 DIAGNOSIS — I739 Peripheral vascular disease, unspecified: Secondary | ICD-10-CM

## 2024-05-12 DIAGNOSIS — I70229 Atherosclerosis of native arteries of extremities with rest pain, unspecified extremity: Secondary | ICD-10-CM

## 2024-05-18 ENCOUNTER — Telehealth: Payer: Self-pay | Admitting: Internal Medicine

## 2024-05-18 NOTE — Telephone Encounter (Signed)
 Inbound call from patient stating her IBS has been acting up and medication has not been helping would like to speak to nurse and be advised on what to do  Please advise  Thank you

## 2024-05-19 NOTE — Telephone Encounter (Signed)
 Patient has diarrhea and stomach cramps again despite the dicyclomine . This was triggered, she feels, by new stress. She did not elaborate, but she said this stress will not be going away anytime soon. She agrees to modify her diet to foods that she knows are easy on her digestion. She asks for your suggestions as well.

## 2024-05-20 NOTE — Telephone Encounter (Signed)
 I think what she is trying to do makes sense.  She should be taking 20 mg of dicyclomine  not 10, which is what we discussed to do at last visit.

## 2024-05-24 NOTE — Telephone Encounter (Signed)
 Confirmed dosage of dicyclomine . States she is feeling a little better.

## 2024-06-04 ENCOUNTER — Ambulatory Visit (HOSPITAL_COMMUNITY)
Admission: RE | Admit: 2024-06-04 | Discharge: 2024-06-04 | Disposition: A | Source: Ambulatory Visit | Attending: Surgery | Admitting: Surgery

## 2024-06-04 DIAGNOSIS — I70229 Atherosclerosis of native arteries of extremities with rest pain, unspecified extremity: Secondary | ICD-10-CM | POA: Insufficient documentation

## 2024-06-04 DIAGNOSIS — I739 Peripheral vascular disease, unspecified: Secondary | ICD-10-CM | POA: Insufficient documentation

## 2024-06-07 ENCOUNTER — Ambulatory Visit: Admitting: Surgery

## 2024-06-07 ENCOUNTER — Encounter: Payer: Self-pay | Admitting: Surgery

## 2024-06-07 VITALS — BP 154/84 | HR 97 | Temp 97.9°F | Ht 63.0 in | Wt 134.0 lb

## 2024-06-07 DIAGNOSIS — I70213 Atherosclerosis of native arteries of extremities with intermittent claudication, bilateral legs: Secondary | ICD-10-CM

## 2024-06-07 LAB — VAS US ABI WITH/WO TBI
Left ABI: 1.18
Right ABI: 0.52

## 2024-06-07 MED ORDER — CILOSTAZOL 100 MG PO TABS: 100.0000 mg | ORAL_TABLET | Freq: Two times a day (BID) | ORAL | 11 refills | Status: AC

## 2024-09-07 ENCOUNTER — Ambulatory Visit: Admitting: Internal Medicine
